# Patient Record
Sex: Female | Born: 1950 | Race: White | Hispanic: No | Smoking: Never smoker
Health system: Southern US, Community
[De-identification: ages and names within clinical notes are randomized; demographics above are authoritative.]

## PROBLEM LIST (undated history)

## (undated) DIAGNOSIS — G43909 Migraine, unspecified, not intractable, without status migrainosus: Secondary | ICD-10-CM

## (undated) DIAGNOSIS — D352 Benign neoplasm of pituitary gland: Secondary | ICD-10-CM

## (undated) DIAGNOSIS — I10 Essential (primary) hypertension: Secondary | ICD-10-CM

## (undated) DIAGNOSIS — T8859XA Other complications of anesthesia, initial encounter: Secondary | ICD-10-CM

## (undated) DIAGNOSIS — R251 Tremor, unspecified: Principal | ICD-10-CM

## (undated) DIAGNOSIS — M1990 Unspecified osteoarthritis, unspecified site: Secondary | ICD-10-CM

## (undated) DIAGNOSIS — F32A Depression, unspecified: Secondary | ICD-10-CM

## (undated) DIAGNOSIS — J189 Pneumonia, unspecified organism: Secondary | ICD-10-CM

## (undated) DIAGNOSIS — F329 Major depressive disorder, single episode, unspecified: Secondary | ICD-10-CM

## (undated) DIAGNOSIS — G43009 Migraine without aura, not intractable, without status migrainosus: Secondary | ICD-10-CM

## (undated) DIAGNOSIS — F419 Anxiety disorder, unspecified: Secondary | ICD-10-CM

## (undated) DIAGNOSIS — R7303 Prediabetes: Secondary | ICD-10-CM

## (undated) DIAGNOSIS — E785 Hyperlipidemia, unspecified: Secondary | ICD-10-CM

## (undated) HISTORY — DX: Hyperlipidemia, unspecified: E78.5

## (undated) HISTORY — DX: Benign neoplasm of pituitary gland: D35.2

## (undated) HISTORY — DX: Unspecified osteoarthritis, unspecified site: M19.90

## (undated) HISTORY — PX: NASAL SINUS SURGERY: SHX719

## (undated) HISTORY — PX: COLONOSCOPY W/ POLYPECTOMY: SHX1380

## (undated) HISTORY — PX: TONSILLECTOMY: SUR1361

## (undated) HISTORY — DX: Major depressive disorder, single episode, unspecified: F32.9

## (undated) HISTORY — PX: CHOLECYSTECTOMY: SHX55

## (undated) HISTORY — DX: Tremor, unspecified: R25.1

## (undated) HISTORY — DX: Essential (primary) hypertension: I10

## (undated) HISTORY — DX: Migraine, unspecified, not intractable, without status migrainosus: G43.909

## (undated) HISTORY — DX: Depression, unspecified: F32.A

## (undated) HISTORY — DX: Migraine without aura, not intractable, without status migrainosus: G43.009

## (undated) HISTORY — PX: WISDOM TOOTH EXTRACTION: SHX21

---

## 1998-04-17 ENCOUNTER — Ambulatory Visit (HOSPITAL_COMMUNITY): Admission: RE | Admit: 1998-04-17 | Discharge: 1998-04-17 | Payer: Self-pay | Admitting: Family Medicine

## 1998-06-20 ENCOUNTER — Other Ambulatory Visit: Admission: RE | Admit: 1998-06-20 | Discharge: 1998-06-20 | Payer: Self-pay | Admitting: Otolaryngology

## 1998-07-10 ENCOUNTER — Other Ambulatory Visit: Admission: RE | Admit: 1998-07-10 | Discharge: 1998-07-10 | Payer: Self-pay | Admitting: Gastroenterology

## 1998-07-13 ENCOUNTER — Ambulatory Visit (HOSPITAL_COMMUNITY): Admission: RE | Admit: 1998-07-13 | Discharge: 1998-07-13 | Payer: Self-pay | Admitting: Gastroenterology

## 1998-07-26 ENCOUNTER — Ambulatory Visit (HOSPITAL_COMMUNITY): Admission: RE | Admit: 1998-07-26 | Discharge: 1998-07-26 | Payer: Self-pay | Admitting: Gastroenterology

## 1999-04-11 ENCOUNTER — Encounter: Payer: Self-pay | Admitting: Family Medicine

## 1999-04-11 ENCOUNTER — Ambulatory Visit: Admission: RE | Admit: 1999-04-11 | Discharge: 1999-04-11 | Payer: Self-pay | Admitting: Family Medicine

## 1999-06-14 ENCOUNTER — Encounter: Payer: Self-pay | Admitting: Family Medicine

## 1999-06-14 ENCOUNTER — Ambulatory Visit (HOSPITAL_COMMUNITY): Admission: RE | Admit: 1999-06-14 | Discharge: 1999-06-14 | Payer: Self-pay | Admitting: Family Medicine

## 1999-11-27 ENCOUNTER — Ambulatory Visit (HOSPITAL_COMMUNITY): Admission: RE | Admit: 1999-11-27 | Discharge: 1999-11-27 | Payer: Self-pay | Admitting: Family Medicine

## 1999-11-27 ENCOUNTER — Encounter: Payer: Self-pay | Admitting: Family Medicine

## 2000-06-25 ENCOUNTER — Ambulatory Visit (HOSPITAL_COMMUNITY): Admission: RE | Admit: 2000-06-25 | Discharge: 2000-06-25 | Payer: Self-pay | Admitting: Family Medicine

## 2000-06-25 ENCOUNTER — Encounter: Payer: Self-pay | Admitting: Family Medicine

## 2000-09-10 ENCOUNTER — Encounter: Payer: Self-pay | Admitting: *Deleted

## 2000-09-10 ENCOUNTER — Emergency Department (HOSPITAL_COMMUNITY): Admission: EM | Admit: 2000-09-10 | Discharge: 2000-09-10 | Payer: Self-pay

## 2001-07-08 ENCOUNTER — Ambulatory Visit (HOSPITAL_COMMUNITY): Admission: RE | Admit: 2001-07-08 | Discharge: 2001-07-08 | Payer: Self-pay | Admitting: Family Medicine

## 2002-06-02 ENCOUNTER — Ambulatory Visit (HOSPITAL_COMMUNITY): Admission: RE | Admit: 2002-06-02 | Discharge: 2002-06-02 | Payer: Self-pay | Admitting: Gastroenterology

## 2002-09-10 ENCOUNTER — Ambulatory Visit (HOSPITAL_COMMUNITY): Admission: RE | Admit: 2002-09-10 | Discharge: 2002-09-10 | Payer: Self-pay | Admitting: *Deleted

## 2002-09-10 ENCOUNTER — Encounter (INDEPENDENT_AMBULATORY_CARE_PROVIDER_SITE_OTHER): Payer: Self-pay | Admitting: Specialist

## 2003-02-07 ENCOUNTER — Ambulatory Visit (HOSPITAL_COMMUNITY): Admission: RE | Admit: 2003-02-07 | Discharge: 2003-02-07 | Payer: Self-pay | Admitting: Family Medicine

## 2003-02-07 ENCOUNTER — Encounter: Payer: Self-pay | Admitting: Family Medicine

## 2003-04-21 ENCOUNTER — Ambulatory Visit (HOSPITAL_COMMUNITY): Admission: RE | Admit: 2003-04-21 | Discharge: 2003-04-21 | Payer: Self-pay | Admitting: Gastroenterology

## 2003-04-21 ENCOUNTER — Encounter: Payer: Self-pay | Admitting: Gastroenterology

## 2003-04-21 IMAGING — CT CT ABDOMEN W/ CM
1 of 4 series · 14 of 32 positions shown, 19 images · IV contrast (omnipaque)
Comparison: none

FINDINGS
CLINICAL DATA: ABDOMINAL PAIN
CT ABDOMEN WITH CONTRAST:
MULTIPLE SPIRAL IMAGES WERE MADE THROUGH THE ABDOMEN AFTER THE INTRAVENOUS INJECTION OF 150 CC
OMNIPAQUE 300.  ORAL CONTRAST WAS ALSO USED.  THE LUNG BASES ARE NORMAL.  THERE IS FATTY CHANGE OF
THE LIVER.  THE SPLEEN, ADRENAL GLANDS, KIDNEYS AND PANCREAS ARE NORMAL.  THERE ARE MULTIPLE
GALLSTONES.  THERE IS NO ACUTE ABNORMALITY.
IMPRESSION
CHOLELITHIASIS WITH NO ACUTE INTRAABDOMINAL ABNORMALITY.
CT SCAN OF THE PELVIS WITH CONTRAST:
ADDITIONAL SPIRAL IMAGES WERE MADE THROUGH THE PELVIS AFTER ORAL AND INTRAVENOUS CONTRAST.  THERE
IS A PROBABLE A 2CM CYST OF THE RIGHT OVARY.  NO FREE FLUID OR INFLAMMATORY CHANGE WITHIN THE
PELVIS.  THERE IS NO ADENOPATHY.  THERE IS NO BONY ABNORMALITY OF THE ABDOMEN OR PELVIS.
PROBABLE 2CM RIGHT OVARIAN CYST.

[Series 2: abd/pelvis 5.0 b30f · axial · 0.62mm/px · z∈[-449,-74]mm · 14 of 83 slices shown, 19 images]
[im 5/83  soft-tissue]
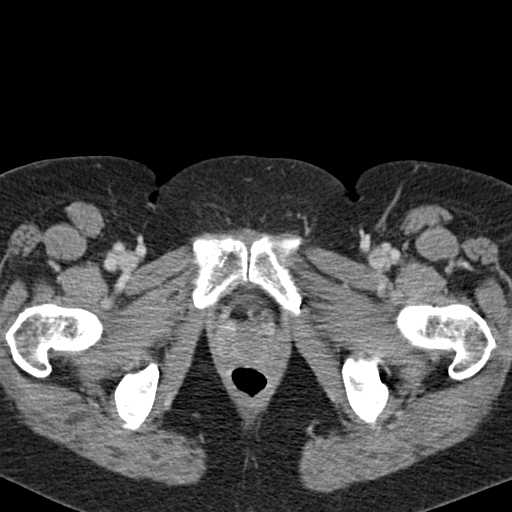
[im 5/83  bone]
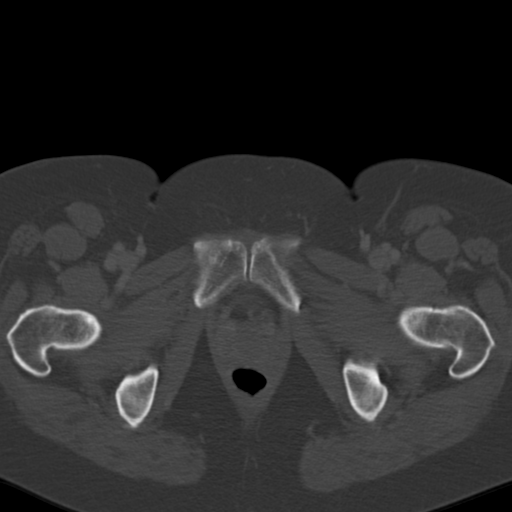
[im 10/83  soft-tissue]
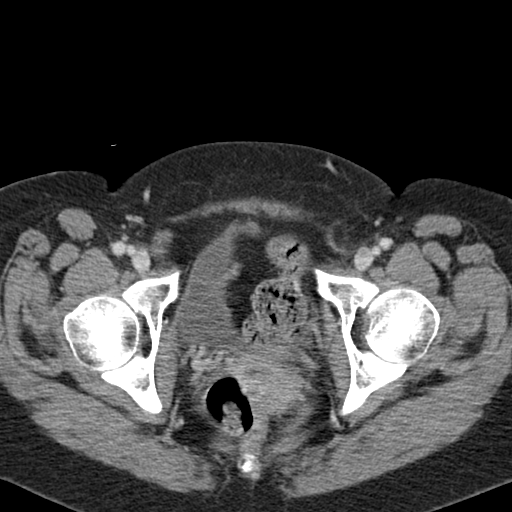
[im 19/83  soft-tissue]
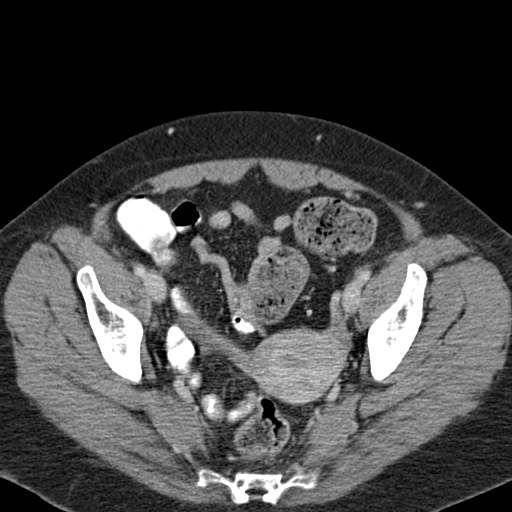
[im 23/83  soft-tissue]
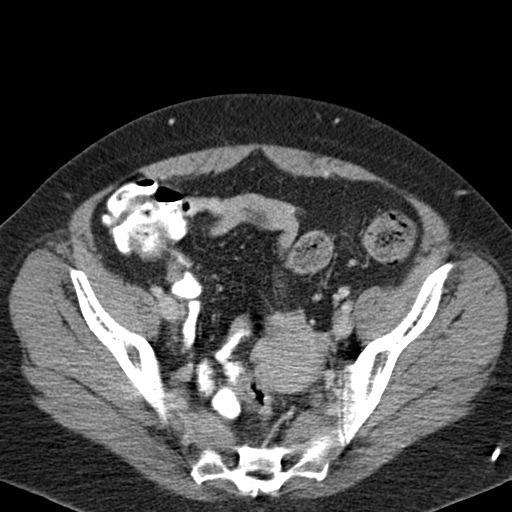
[im 28/83  soft-tissue]
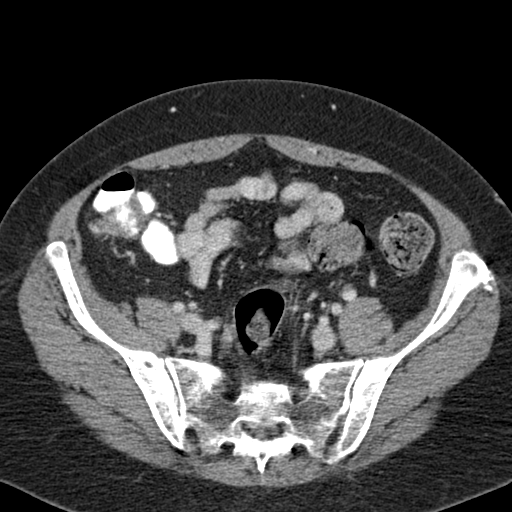
[im 37/83  soft-tissue]
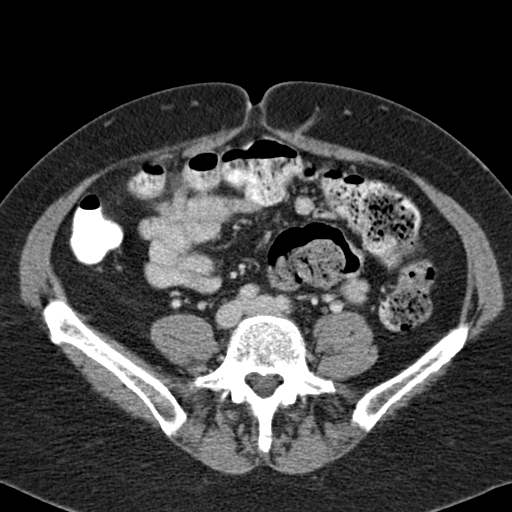
[im 42/83  soft-tissue]
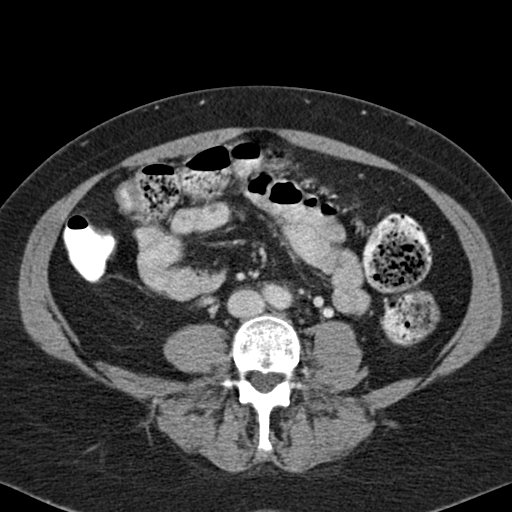
[im 46/83  soft-tissue]
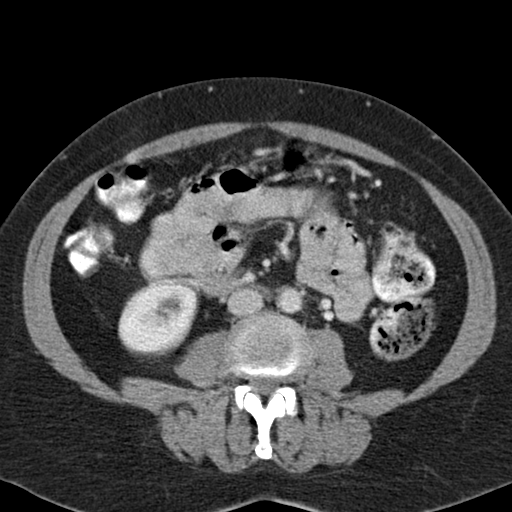
[im 55/83  soft-tissue]
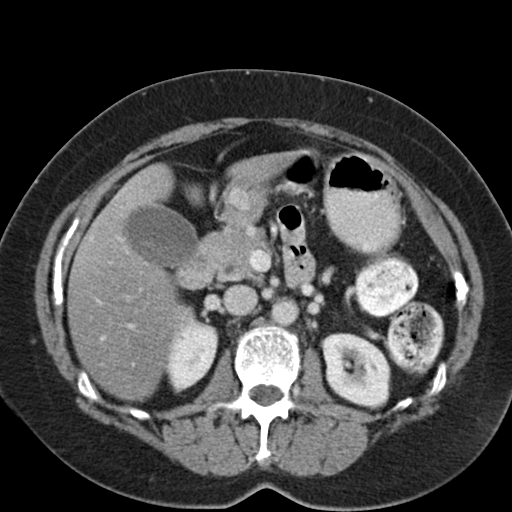
[im 55/83  bone]
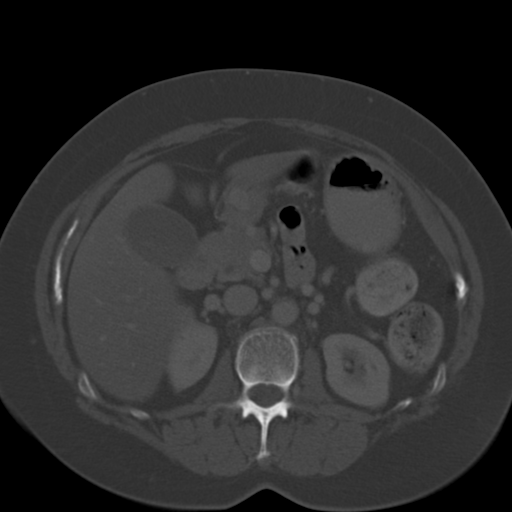
[im 60/83  soft-tissue]
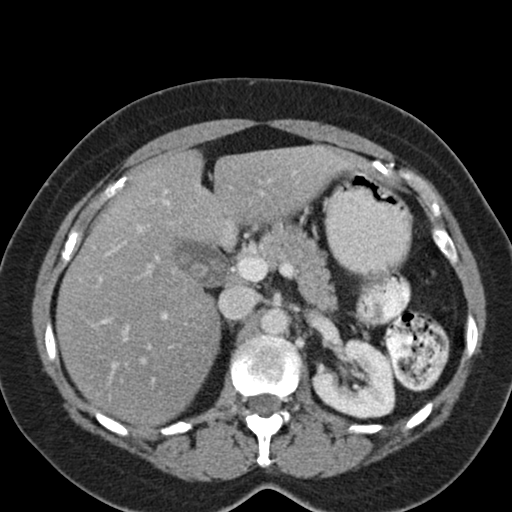
[im 64/83  soft-tissue]
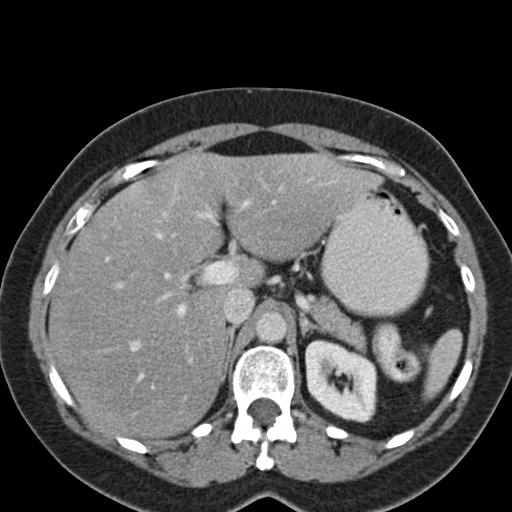
[im 64/83  lung]
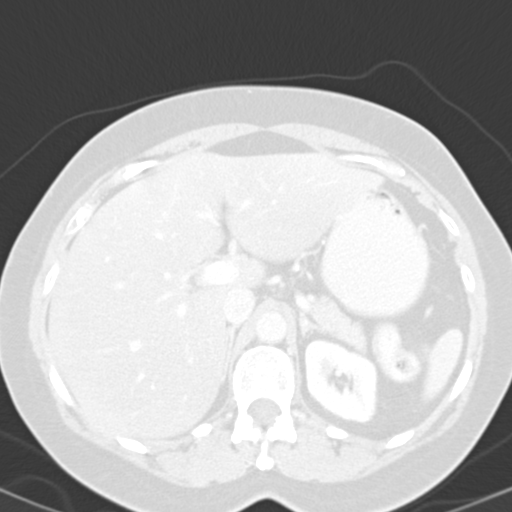
[im 69/83  lung]
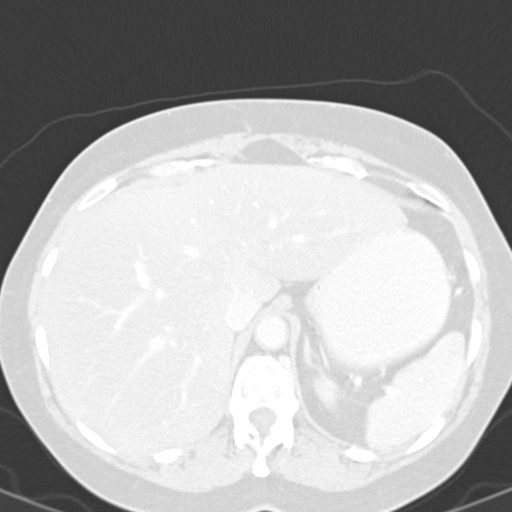
[im 73/83  soft-tissue]
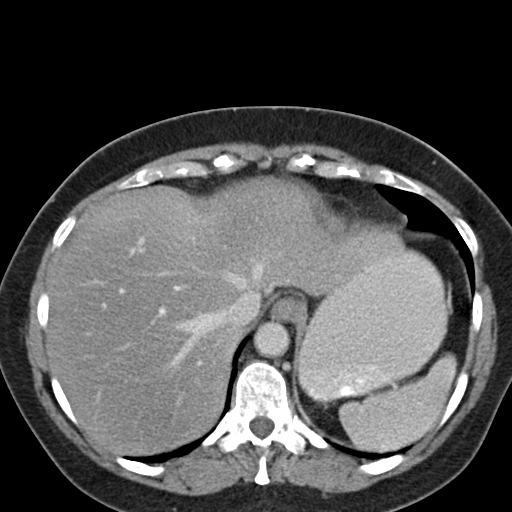
[im 73/83  lung]
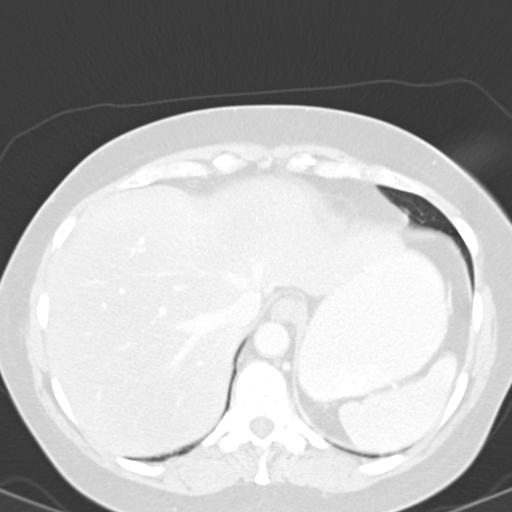
[im 78/83  soft-tissue]
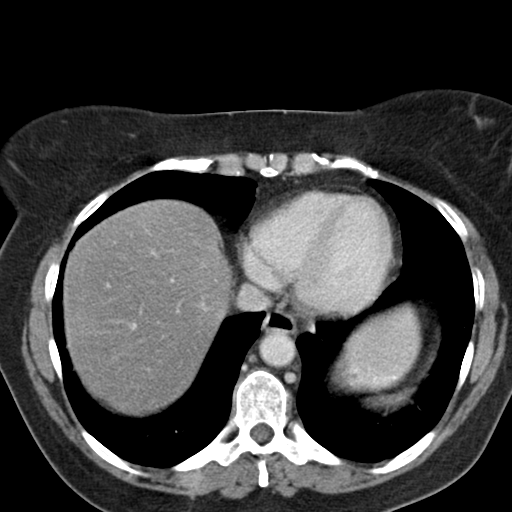
[im 78/83  lung]
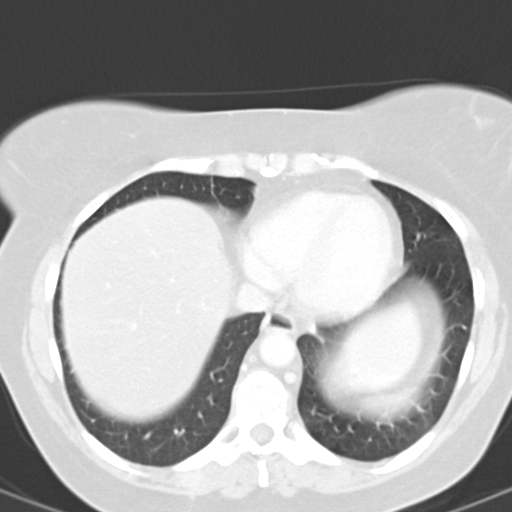

[14 of 32 positions shown; findings below may reference images not displayed]

## 2003-04-23 ENCOUNTER — Observation Stay (HOSPITAL_COMMUNITY): Admission: RE | Admit: 2003-04-23 | Discharge: 2003-04-24 | Payer: Self-pay | Admitting: *Deleted

## 2003-04-23 ENCOUNTER — Encounter (INDEPENDENT_AMBULATORY_CARE_PROVIDER_SITE_OTHER): Payer: Self-pay | Admitting: Specialist

## 2003-05-23 ENCOUNTER — Other Ambulatory Visit: Admission: RE | Admit: 2003-05-23 | Discharge: 2003-05-23 | Payer: Self-pay | Admitting: *Deleted

## 2003-12-13 ENCOUNTER — Ambulatory Visit (HOSPITAL_BASED_OUTPATIENT_CLINIC_OR_DEPARTMENT_OTHER): Admission: RE | Admit: 2003-12-13 | Discharge: 2003-12-13 | Payer: Self-pay | Admitting: Otolaryngology

## 2003-12-13 ENCOUNTER — Ambulatory Visit (HOSPITAL_COMMUNITY): Admission: RE | Admit: 2003-12-13 | Discharge: 2003-12-13 | Payer: Self-pay | Admitting: Otolaryngology

## 2003-12-13 ENCOUNTER — Encounter (INDEPENDENT_AMBULATORY_CARE_PROVIDER_SITE_OTHER): Payer: Self-pay | Admitting: *Deleted

## 2004-04-09 ENCOUNTER — Ambulatory Visit (HOSPITAL_COMMUNITY): Admission: RE | Admit: 2004-04-09 | Discharge: 2004-04-09 | Payer: Self-pay | Admitting: Family Medicine

## 2004-04-18 ENCOUNTER — Encounter: Admission: RE | Admit: 2004-04-18 | Discharge: 2004-04-18 | Payer: Self-pay | Admitting: Family Medicine

## 2004-04-19 ENCOUNTER — Encounter: Admission: RE | Admit: 2004-04-19 | Discharge: 2004-04-19 | Payer: Self-pay | Admitting: Family Medicine

## 2004-05-04 ENCOUNTER — Encounter (HOSPITAL_COMMUNITY): Admission: RE | Admit: 2004-05-04 | Discharge: 2004-05-10 | Payer: Self-pay | Admitting: Family Medicine

## 2004-05-04 IMAGING — MR MR BREAST BILAT WO/W CM
5 of 6 series · 20 of 48 positions shown · IV contrast (omniscan)
Comparison: none

Bilateral CC and MLO view(s) were taken.
Technologist: Queena Jumper

FINDINGS
BILATERAL BREAST MRI WITH/WITHOUT CONTRAST
Clinical:  Mild tissue asymmetry in the upper outer left breast on mammogram.
TECHNIQUE: Multisequence sagittal images were obtained through both breasts before and after
administration of 18 cc of Omniscan contrast.  Correlation is made with the mammogram from [facility]
[REDACTED] dated 04/09/04 and 04/19/04.

[Series 1: 3-plane local · axial · 5.0mm · 1.56mm/px · 1 of 33 slices shown]
[im 1/33]
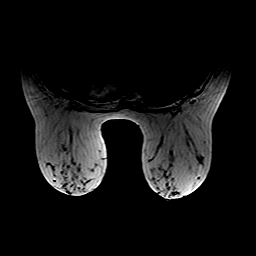

[Series 3: T2 · sagittal · 4.0mm · 0.39mm/px · 2 of 56 slices shown]
[im 1/56]
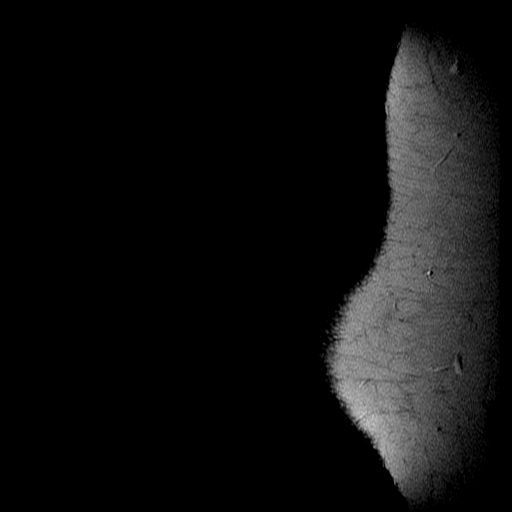
[im 56/56]
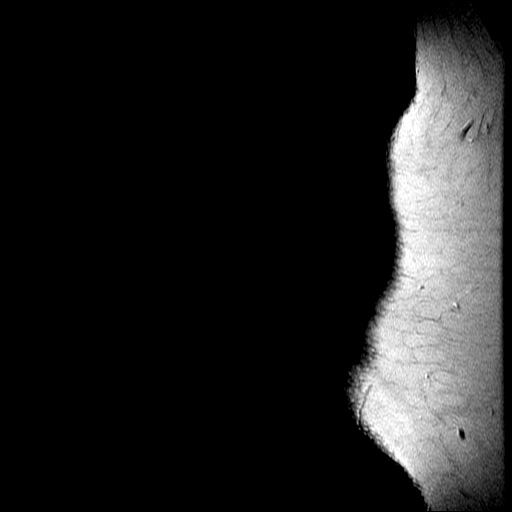

[Series 4: vibrant - large · sagittal · 2.8mm · 0.39mm/px · 4 of 108 slices shown]
[im 1/108]
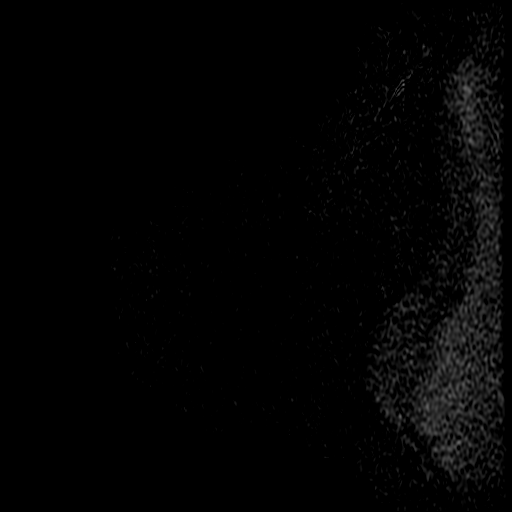
[im 36/108]
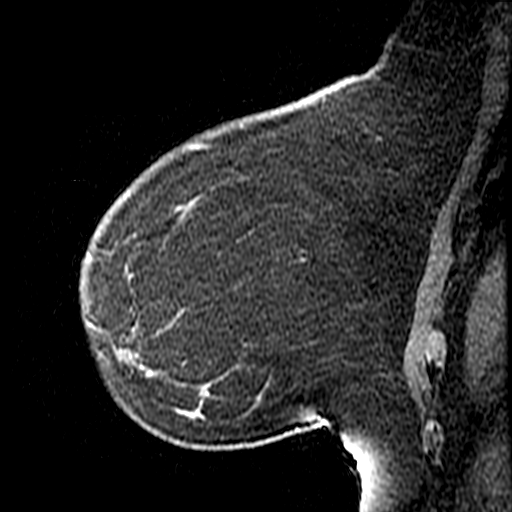
[im 72/108]
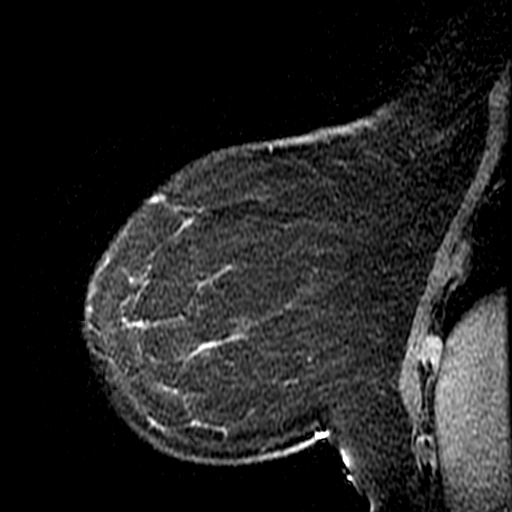
[im 108/108]
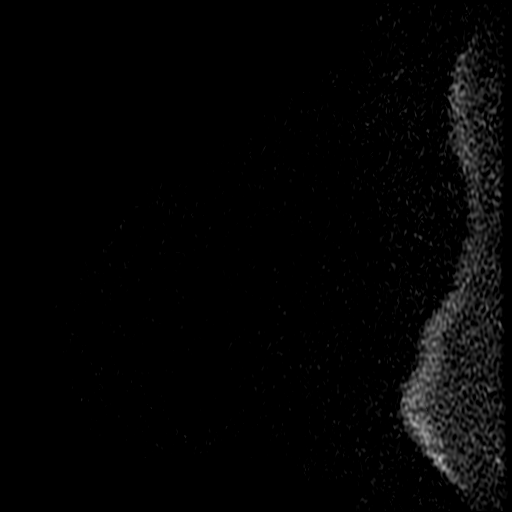

[Series 5: vibrant -18 ml · sagittal · 2.8mm · 0.39mm/px · 11 of 648 slices shown]
[im 30/648]
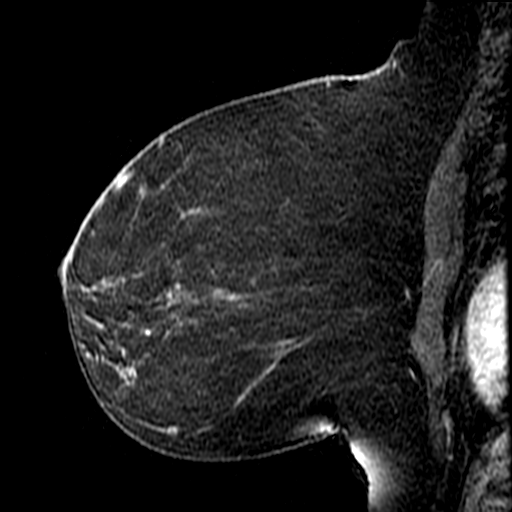
[im 89/648]
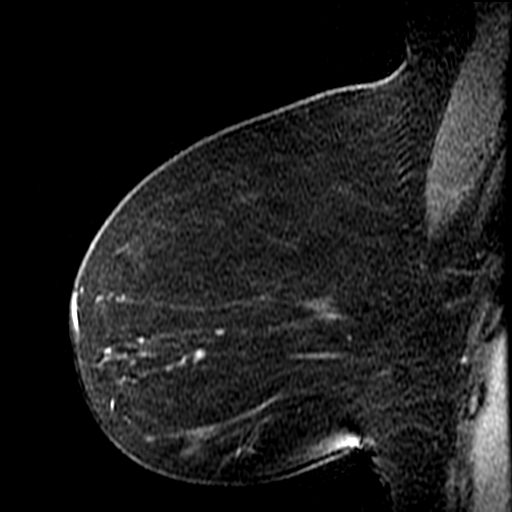
[im 118/648]
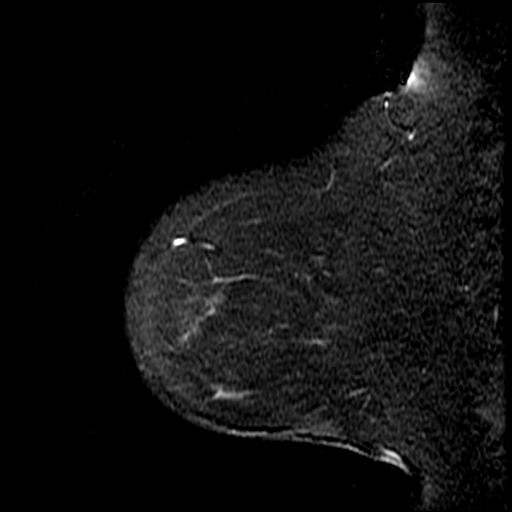
[im 206/648]
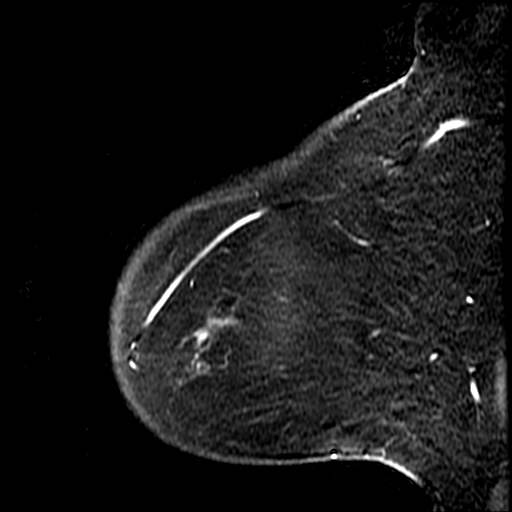
[im 295/648]
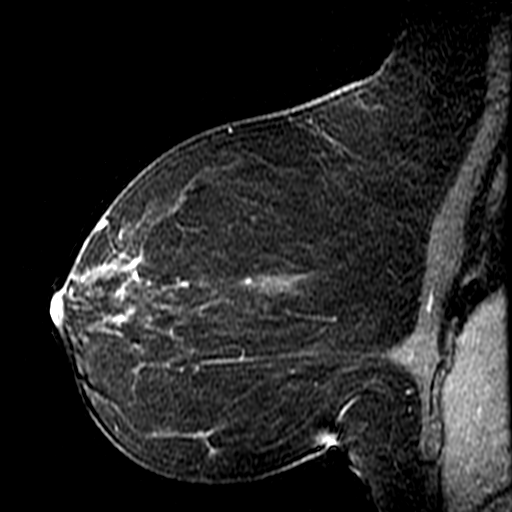
[im 324/648]
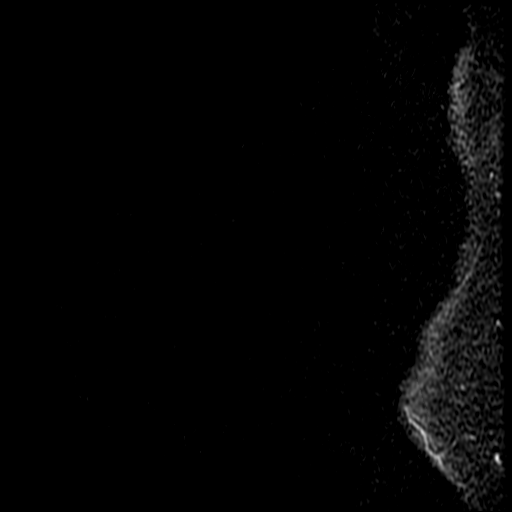
[im 353/648]
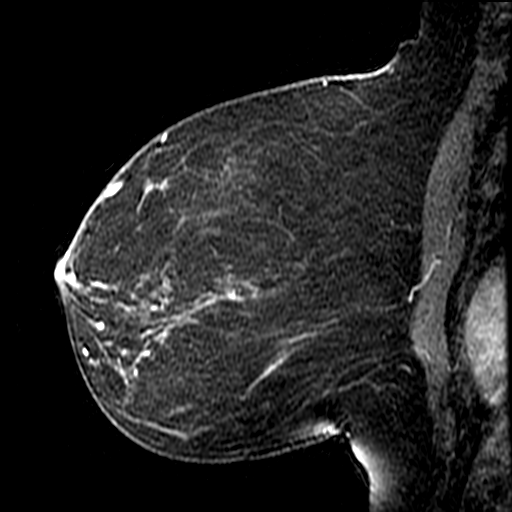
[im 442/648]
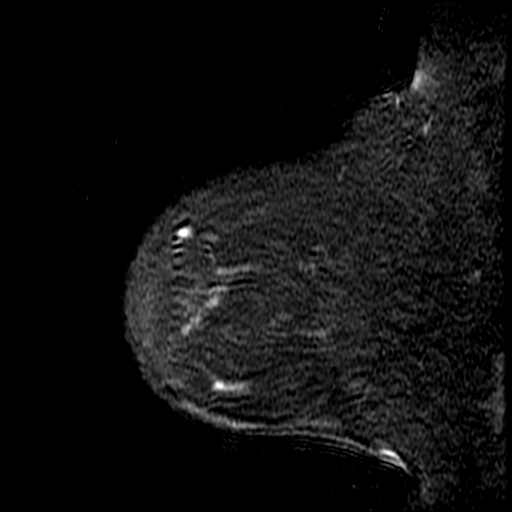
[im 530/648]
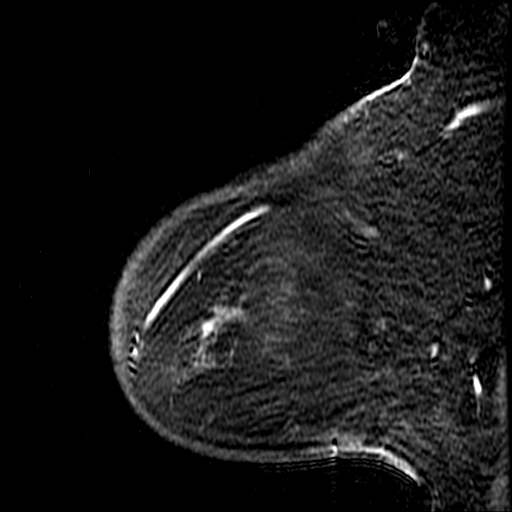
[im 559/648]
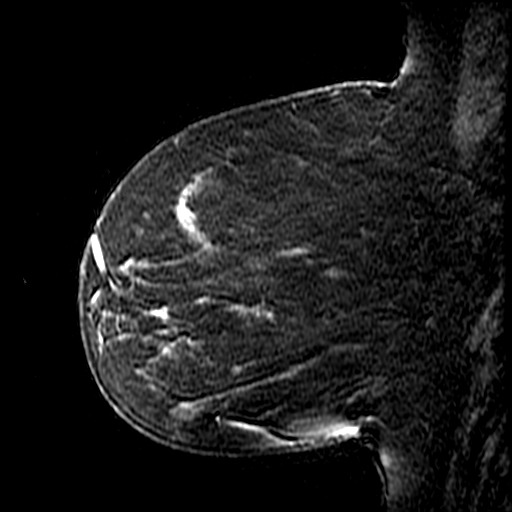
[im 618/648]
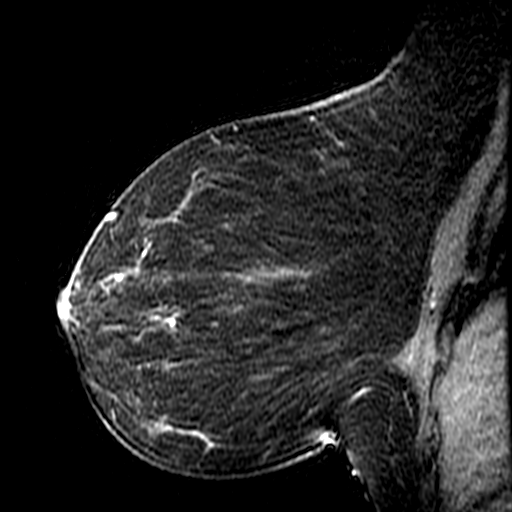

[Series 500: ((date))-((date)) · sagittal · 2.8mm · 0.39mm/px · 2 of 494 slices shown]
[im 31/494]
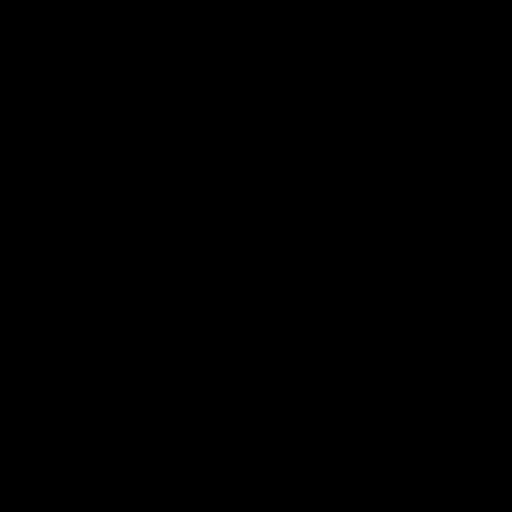
[im 93/494]
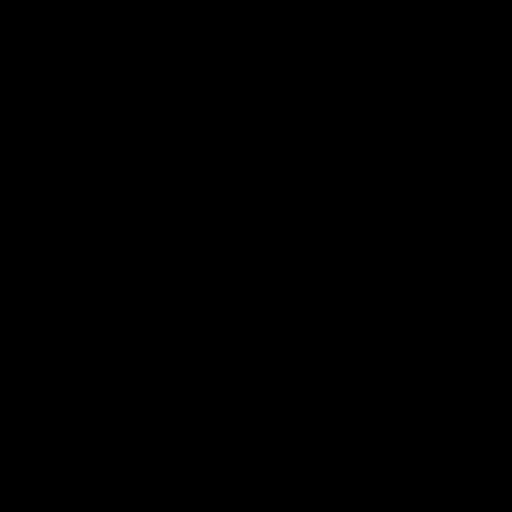

[20 of 48 positions shown; findings below may reference images not displayed]

FINDINGS: No suspicious masses or enhancement are identified in both breasts.  No lower axillary
adenopathy is noted bilaterally. Small islands of normal enhancing tissue are seen within the
lateral aspects of both breasts which are generally symmetric.

IMPRESSION

There is no MRI evidence of malignancy bilaterally.  A left-sided mammogram in six months is
recommended.

ASSESSMENT: Benign - BI-RADS 2

Mammogram of the left breast.

## 2004-05-30 ENCOUNTER — Encounter: Admission: RE | Admit: 2004-05-30 | Discharge: 2004-05-30 | Payer: Self-pay | Admitting: Gastroenterology

## 2004-05-30 IMAGING — CT CT ABDOMEN W/ CM
1 of 3 series · 14 of 32 positions shown, 19 images · IV contrast (GASTRO. & [ID] OMNI 300)
Comparison: none

CLINICAL DATA: Left upper quadrant abdominal pain for 2? weeks.  History of cholecystectomy and appendectomy.  
   CT OF THE ABDOMEN AND PELVIS WITH CONTRAST
 Multidetector helical CT imaging was performed during the IV bolus injection of 100 cc Omnipaque 300.  Oral contrast was given.  Correlation is made with a prior study done at [HOSPITAL] on 04/21/03.
 ABDOMEN:
 Tiny granuloma at the left lung base on image 10 is noted.  The lung bases are otherwise clear.  The liver demonstrates diffuse mild fatty infiltration without focal abnormality.  The patient has undergone interval cholecystectomy.  There is no biliary dilatation.  6 mm cyst in the upper pole of the left kidney is unchanged.  The spleen, pancreas, adrenal glands and kidneys are otherwise unremarkable.  No bowel lesion or inflammatory process is demonstrated.
 IMPRESSION
 Stable abdominal CT status post interval cholecystectomy.  No explanation for left upper quadrant pain.
 PELVIS: 
 No pelvic mass, fluid collection, or inflammatory process is demonstrated.  The uterus and adnexa appear unremarkable.  Stool is present throughout the colon. 
 No acute pelvic findings.

[Series 2: — · axial · 0.70mm/px · z∈[-340,+50]mm · 14 of 88 slices shown, 19 images]
[im 5/88  soft-tissue]
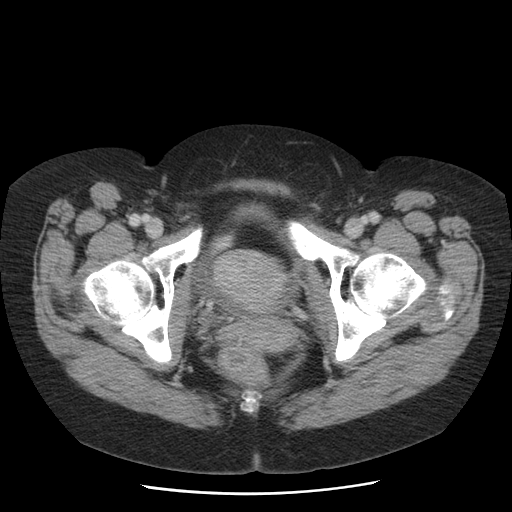
[im 5/88  bone]
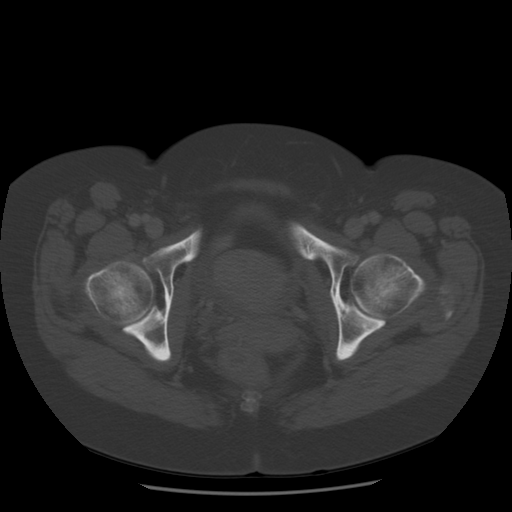
[im 13/88  soft-tissue]
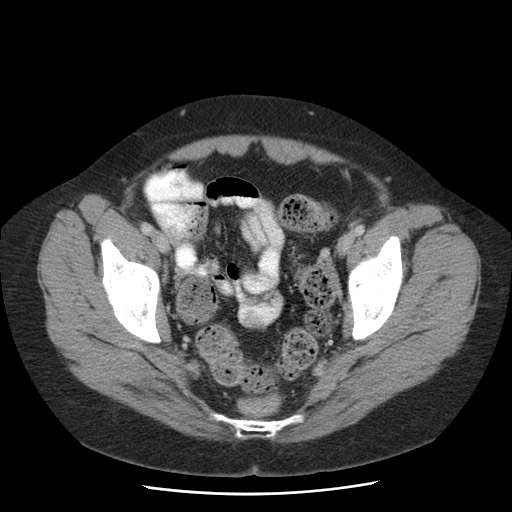
[im 17/88  soft-tissue]
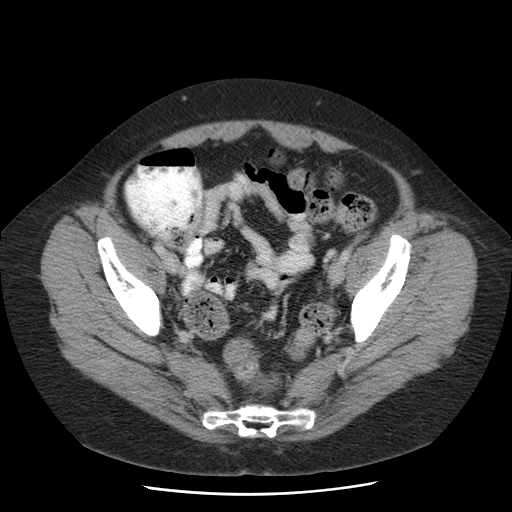
[im 25/88  soft-tissue]
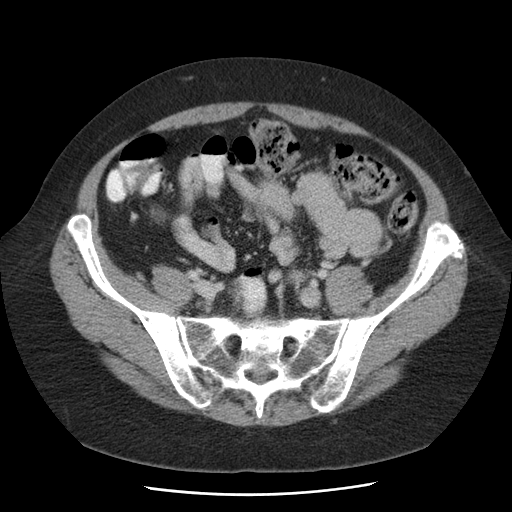
[im 30/88  soft-tissue]
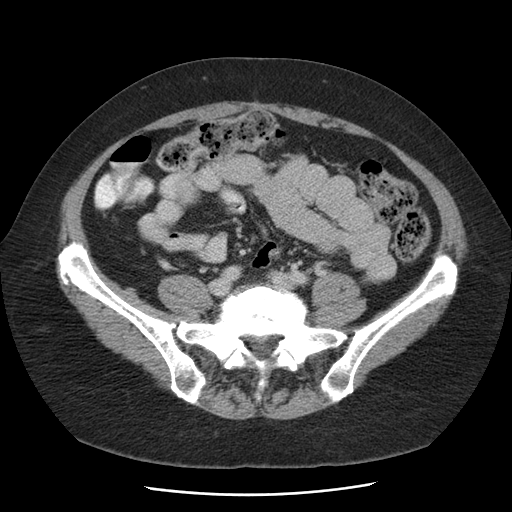
[im 38/88  soft-tissue]
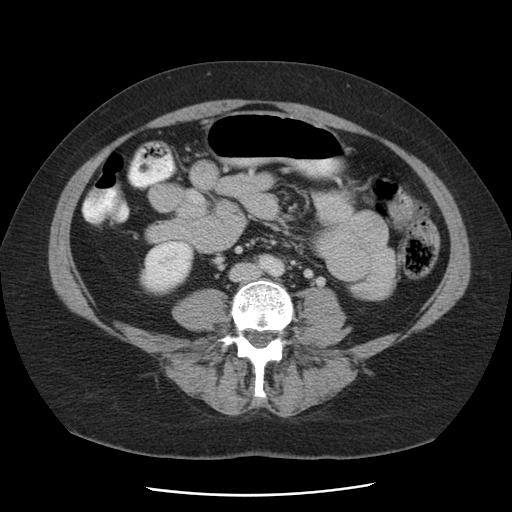
[im 46/88  soft-tissue]
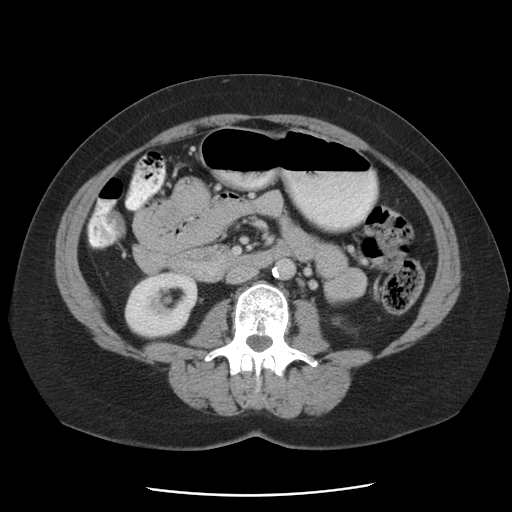
[im 50/88  soft-tissue]
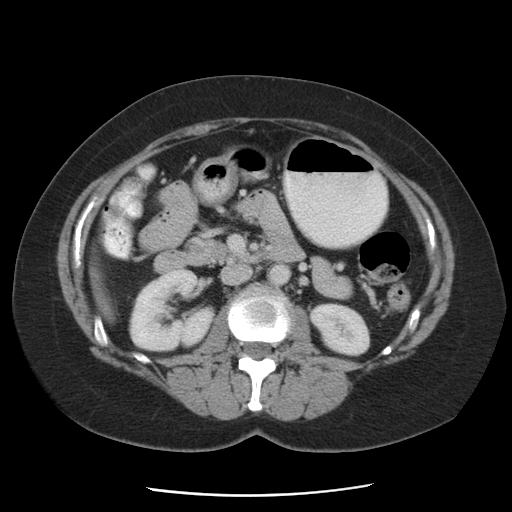
[im 59/88  soft-tissue]
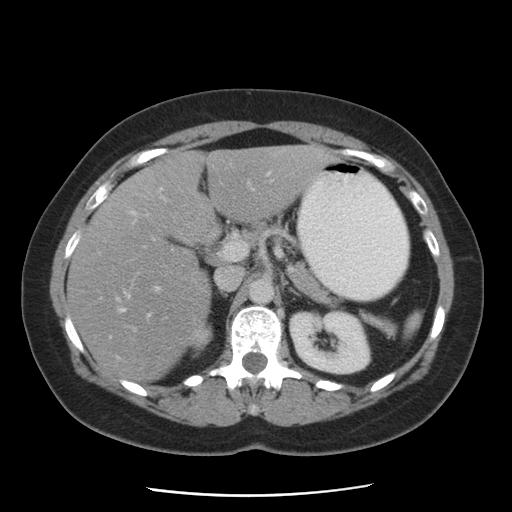
[im 59/88  bone]
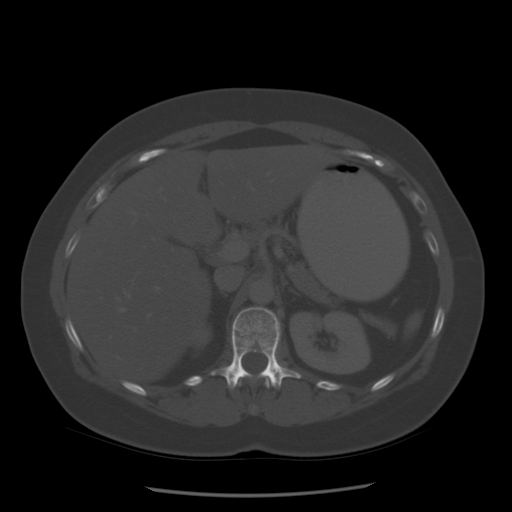
[im 63/88  soft-tissue]
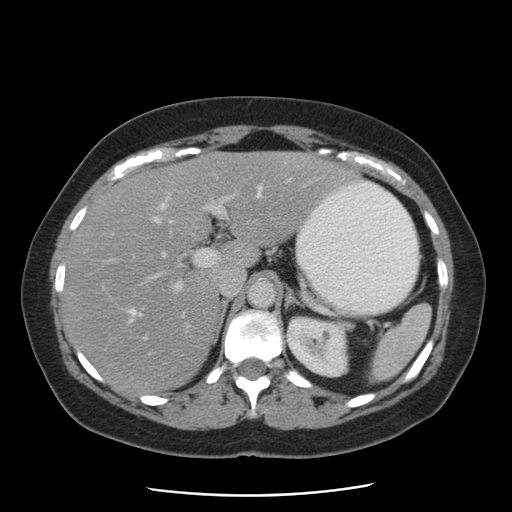
[im 71/88  soft-tissue]
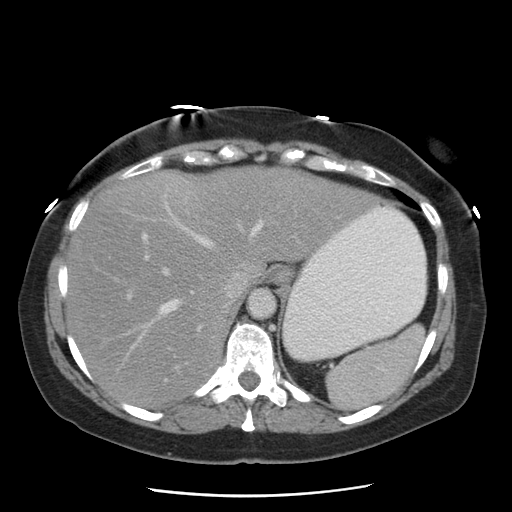
[im 71/88  lung]
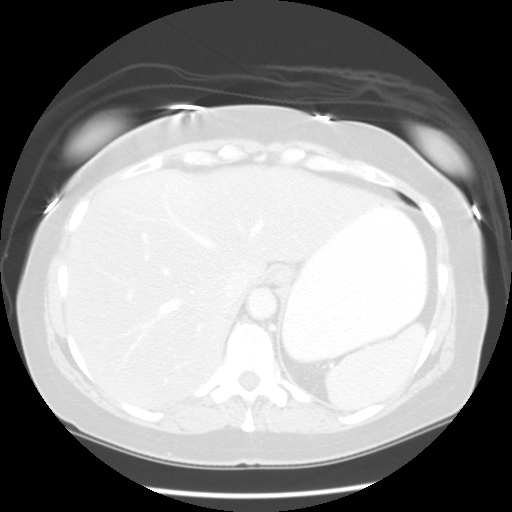
[im 75/88  soft-tissue]
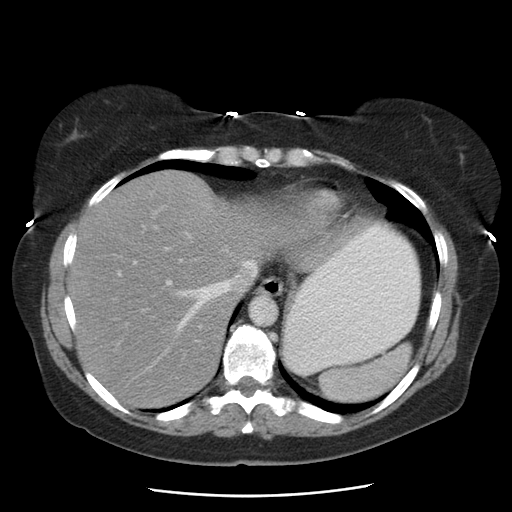
[im 75/88  lung]
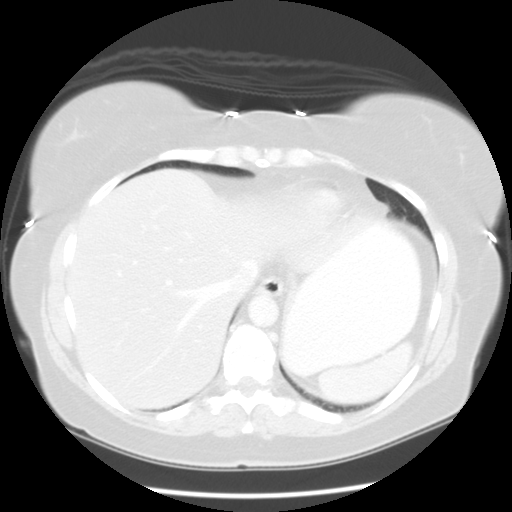
[im 79/88  lung]
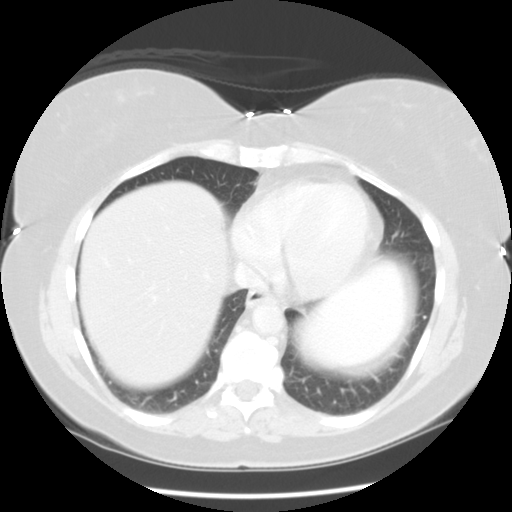
[im 83/88  soft-tissue]
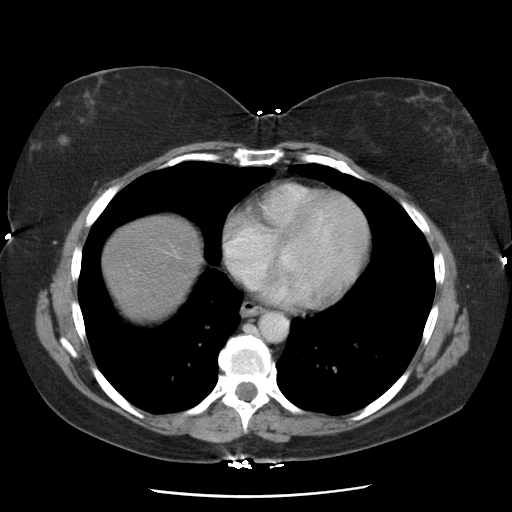
[im 83/88  lung]
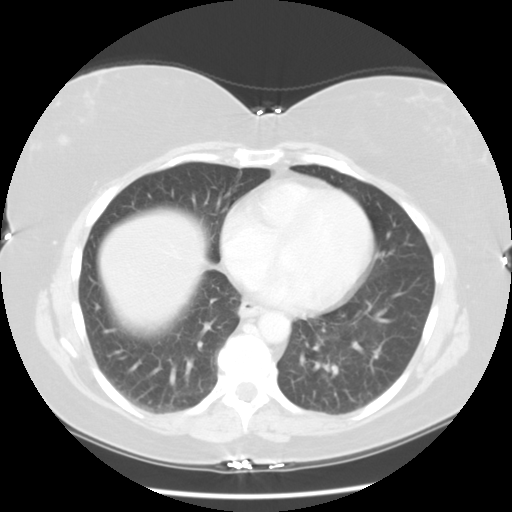

[14 of 32 positions shown; findings below may reference images not displayed]

## 2005-01-17 ENCOUNTER — Emergency Department (HOSPITAL_COMMUNITY): Admission: EM | Admit: 2005-01-17 | Discharge: 2005-01-17 | Payer: Self-pay | Admitting: Emergency Medicine

## 2005-06-10 ENCOUNTER — Encounter: Admission: RE | Admit: 2005-06-10 | Discharge: 2005-06-10 | Payer: Self-pay | Admitting: *Deleted

## 2006-03-18 ENCOUNTER — Emergency Department (HOSPITAL_COMMUNITY): Admission: EM | Admit: 2006-03-18 | Discharge: 2006-03-19 | Payer: Self-pay | Admitting: Emergency Medicine

## 2007-01-20 ENCOUNTER — Ambulatory Visit (HOSPITAL_COMMUNITY): Admission: RE | Admit: 2007-01-20 | Discharge: 2007-01-20 | Payer: Self-pay | Admitting: Obstetrics and Gynecology

## 2008-01-21 ENCOUNTER — Ambulatory Visit (HOSPITAL_COMMUNITY): Admission: RE | Admit: 2008-01-21 | Discharge: 2008-01-21 | Payer: Self-pay | Admitting: Obstetrics and Gynecology

## 2008-04-05 ENCOUNTER — Encounter: Admission: RE | Admit: 2008-04-05 | Discharge: 2008-04-05 | Payer: Self-pay | Admitting: Specialist

## 2008-04-06 ENCOUNTER — Ambulatory Visit: Payer: Self-pay | Admitting: Cardiology

## 2008-04-06 ENCOUNTER — Observation Stay (HOSPITAL_COMMUNITY): Admission: EM | Admit: 2008-04-06 | Discharge: 2008-04-08 | Payer: Self-pay | Admitting: Emergency Medicine

## 2008-04-06 IMAGING — CR DG CHEST 1V PORT
1 series · 1 of 1 positions shown · non-contrast
Comparison: None

CLINICAL DATA: Chest pain, shortness of breath

PORTABLE CHEST - 1 VIEW

[AP]
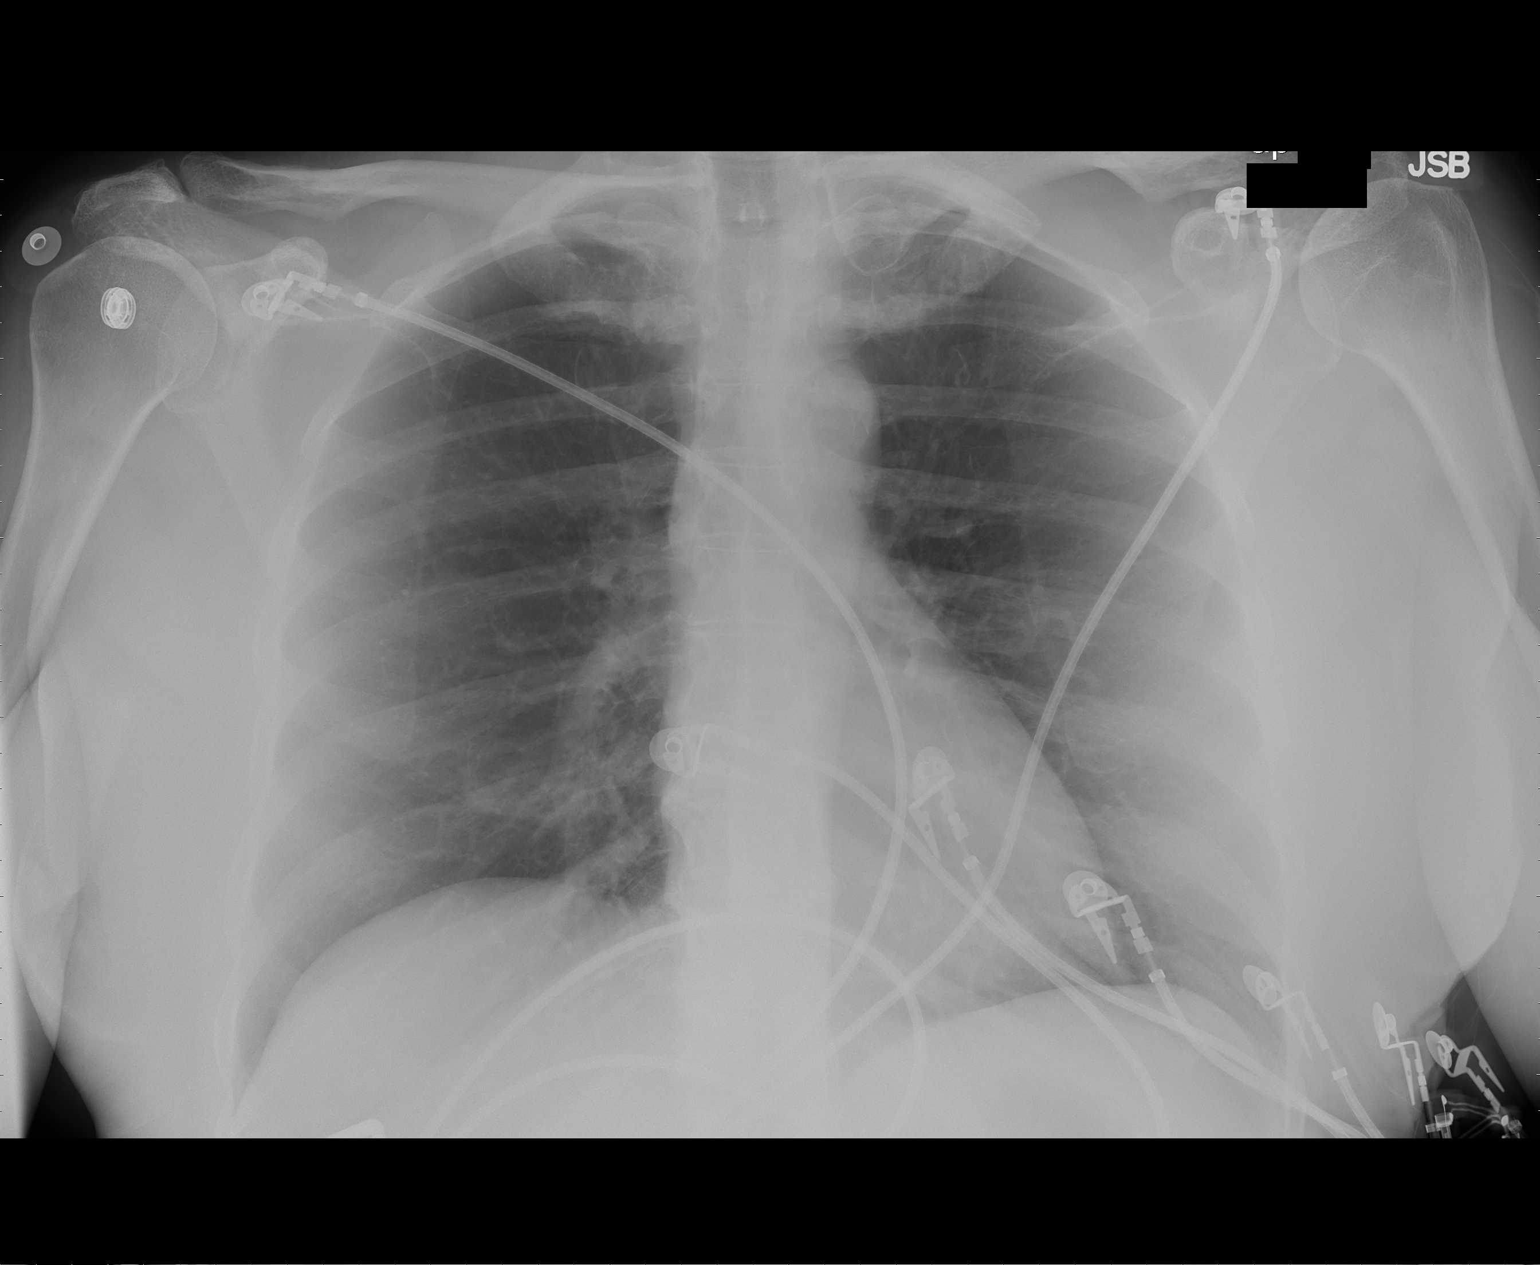

[1 of 1 positions shown; findings below may reference images not displayed]

FINDINGS: Heart and mediastinal contours are within normal limits.
No focal opacities or effusions.  No acute bony abnormality.
IMPRESSION: No active disease.

## 2008-04-07 ENCOUNTER — Encounter (INDEPENDENT_AMBULATORY_CARE_PROVIDER_SITE_OTHER): Payer: Self-pay | Admitting: Internal Medicine

## 2008-04-19 ENCOUNTER — Ambulatory Visit: Payer: Self-pay

## 2008-05-05 ENCOUNTER — Ambulatory Visit: Payer: Self-pay | Admitting: Cardiology

## 2009-01-23 ENCOUNTER — Ambulatory Visit (HOSPITAL_COMMUNITY): Admission: RE | Admit: 2009-01-23 | Discharge: 2009-01-23 | Payer: Self-pay | Admitting: Obstetrics and Gynecology

## 2010-02-09 ENCOUNTER — Ambulatory Visit (HOSPITAL_COMMUNITY): Admission: RE | Admit: 2010-02-09 | Discharge: 2010-02-09 | Payer: Self-pay | Admitting: Obstetrics and Gynecology

## 2010-04-19 ENCOUNTER — Encounter: Admission: RE | Admit: 2010-04-19 | Discharge: 2010-05-18 | Payer: Self-pay | Admitting: Specialist

## 2010-10-21 ENCOUNTER — Encounter: Payer: Self-pay | Admitting: Family Medicine

## 2011-02-12 NOTE — Discharge Summary (Signed)
NAMEDANEILLE, DESILVA          ACCOUNT NO.:  [id number]   MEDICAL RECORD NO.:  [id number]          PATIENT TYPE:  INP   LOCATION:  2030                         FACILITY:  [facility]   PHYSICIAN:  Altha Harm, MDDATE OF BIRTH:  1951-04-03   DATE OF ADMISSION:  04/06/2008  DATE OF DISCHARGE:  04/08/2008                               DISCHARGE SUMMARY   DISCHARGE DISPOSITION:  Home.   DISCHARGE DIAGNOSES:  1. Chest pain, atypical.  2. Hyperlipidemia.  3. History of hypertension.  4. History of headaches.  5. History of allergic rhinitis.   DISCHARGE MEDICATIONS:  The patient is to continue her medications at  the prescribed doses prehospital.  Please note that we have no records  of the patient's medications.  I have asked the patient to get her  medications and the doses, which she states that she is unable to  provide, and her primary care doctor does not have the doses of her  headache medications.  We are unable to get those medication doses at  this time.  Thus, the patient will be instructed to resume her  medications as prehospital.  They include the following.  1. Triamterene.  2. Lipitor.  3. Depakote as needed.  4. Imipramine as needed.  5. Astelin as needed.  6. Singulair 10 mg daily as needed.  7. In addition, the patient is being prescribed Lopressor 12.5 mg p.o.      b.i.d.   CONSULTANTS:  [facility].   PROCEDURES:  None.   DIAGNOSTIC STUDIES:  1. Portable chest x-ray done on admission which shows no active      disease.  2. 2-D echocardiogram which shows normal left ventricular systolic      function with ejection fraction of 50-55%.  No regional wall motion      abnormalities, ventricular wall thickness mildly increased, and      Doppler parameters consistent with abnormal left ventricular      relaxation.   CODE STATUS:  Full Code.   ALLERGIES:  No known drug allergies.   CHIEF COMPLAINT:  Chest pain.   HISTORY OF PRESENT ILLNESS:   Please see the H&P dictated by Dr. Christella Noa  for details of the HPI.   HOSPITAL COURSE:  The patient was admitted to the hospital.  She was  placed on observation and serial enzymes ordered on the patient.  All  her serial enzymes were negative.  Cardiology is consulted to see the  patient and is here to offer an opinion.  However, the patient has made  the decision despite the cardiology consult that she would rather be  discharged home and have any stress testing done as an outpatient.  Thus, the patient will be discharged home and arrangements will be made  for stress test after the patient has been seen by cardiology.   FOLLOWUP:  The patient is to follow up with her primary care physician  as needed and an addendum to her discharge summary will be made pending  the recommendations of cardiology after she has been seen.      Altha Harm, MD  Electronically Signed  MAM/MEDQ  D:  04/08/2008  T:  04/08/2008  Job:  [id number]

## 2011-02-12 NOTE — Consult Note (Signed)
NAMEMANDEEP, FERCH          ACCOUNT NO.:  [id number]   MEDICAL RECORD NO.:  [id number]          PATIENT TYPE:  INP   LOCATION:  2030                         FACILITY:  [facility]   PHYSICIAN:  Madolyn Frieze. Jens Som, MD, FACCDATE OF BIRTH:  Oct 19, 1950   DATE OF CONSULTATION:  04/08/2008  DATE OF DISCHARGE:  04/08/2008                                 CONSULTATION   PRIMARY CARE PHYSICIAN:  Ace Gins, MD, at [facility].   CHIEF COMPLAINT:  Chest pain.   HISTORY OF PRESENT ILLNESS:  Ms. Yontz is a 60 year old female with  no previous history of coronary artery disease.  She was in her usual  state of health on April 06, 2008.  She was on her way to [facility] that  night with her husband and when she got out of the car, she had sudden  onset of substernal chest pain that she states reached a 9/10.  She  describes it as a tightness in her heart pain but not throbbing.  EMS  was called when her symptoms did not resolve, and she was transported to  the emergency room.  She was given aspirin, but no other medicines,  because they were unable to establish an IV.  In the emergency room,  they gained IV access and she received IV Lopressor but no nitroglycerin  is remembered by the patient or recorded.  Her symptoms lasted a total  of approximately 1 hour and resolved without any further therapy.  She  has never had these before.  There has been no recurrence of symptoms  while she has been in the hospital.  She states that she exercises  regularly with water aerobics and walks a dog on a daily basis but has  never had chest pain before.   PAST MEDICAL HISTORY:  1. Hypertension for 7 years.  2. Hyperlipidemia for 7 years.  3. History of herniated lumbar disk.  4. History of sciatica.  5. Remote history of irritable bowel.  6. Remote history of polio.  7. History of migraines.   SURGICAL HISTORY:  She is status post cholecystectomy, tonsillectomy,  sinus surgery x2,  D&C, colonoscopy, and an epidural lumbar injection on  April 04, 2008.   ALLERGIES:  No known drug allergies.   CURRENT MEDICATIONS:  1. Elavil 25 mg nightly.  2. Depakote 500 b.i.d.  3. DVT, Lovenox.  4. Metoprolol 25 mg b.i.d.  5. Singulair 10 mg daily.  6. Nitroglycerin paste one-half inch q.6 h.  7. Protonix 40 mg a day.  8. Altace 5 mg daily.  9. Senokot nightly.   SOCIAL HISTORY:  She lives in [place] with her husband.  She has no  history of alcohol, tobacco, or drug abuse.  She is a retired Runner, broadcasting/film/video  and currently housewife.  She works out at [employer] with [employer] 3  days a week and walks her dog for about 30 minutes a day.   FAMILY HISTORY:  Her mother died at age 32 and her father died at age  56, neither one with a history of coronary artery disease.  She is the  only child.   REVIEW OF SYSTEMS:  She had back pain that started on March 13, 2008, for  which she received the epidural injection.  It did have improved  somewhat but she still has quite a bit of difficulty getting around.  She is wearing a TENS unit for sciatica.  She has rare reflux symptoms.  She has some chronic issues with constipation.  There is no history of  dyspnea on exertion.  She is easily fatigued and when she is fatigued,  she will get leg weakness which is secondary to her polio.  She also has  back problems and radiation of pain down her leg, left greater than  right.  Full 14-point review of systems is otherwise negative.   PHYSICAL EXAMINATION:  VITAL SIGNS:  Temperature is 97.2, blood pressure  118/77, pulse 79, respiratory rate 20, and O2 saturation 99% on room  air.  GENERAL:  She is a well-developed, well-nourished white female in no  acute distress.  HEENT:  Normal.  NECK:  There is no JVD, no bruit, no lymphadenopathy, no thyromegaly is  noted.  CHEST:  Clear to auscultation bilaterally.  CARDIOVASCULAR:  Heart is regular in rate and rhythm with no significant  murmur, rub,  or gallop noted.  PMI is not displaced.  ABDOMEN:  Soft and nontender with active bowel sounds and no  splenomegaly by palpation.  MUSCULOSKELETAL:  There is no joint deformity or effusions and no CVA  tenderness although her lumbar spine is slightly tender but this is not  new.  EXTREMITIES:  There is no edema noted.  Distal pulses are intact in all  4 extremities and no femoral bruits are appreciated.  NEUROLOGIC:  She is alert and oriented and has no focal cranial nerve  deficits.  She does have chronic left lower extremity weakness and some  foot drop on that side for which she is seen by Dr. Shelle Iron.   LABORATORY VALUES:  TSH is slightly low at 0.219, hemoglobin 14,  hematocrit 40.2, WBCs 10.9, and platelets 260.  Sodium 132, potassium  3.9, chloride 104, CO2 of 28, BUN 16, creatinine 0.83, glucose 124, CK-  MB and troponin I negative x3.  BNP less than 30, total cholesterol 166,  triglycerides 86, HDL 64, and LDL 85.   Chest x-ray, no acute disease.   EKG:  Sinus rhythm, rate 79 with minimal J-point elevation in V1 and V2  and no acute ischemic changes.  This is essentially unchanged from an  EKG dated 2005.   2D echocardiogram:  EF 50-55% with no regional wall motion abnormalities  and left ventricular wall thickness mildly increased, abnormal left  ventricular relaxation noted by Doppler.  Aortic valve thickness mildly  increased.  No pericardial effusion and no critical valvular  abnormalities.   IMPRESSION:  Chest pain:  Ms. Reifschneider was seen today by Dr. Jens Som.  She is a 60 year old female with past medical history of hyperlipidemia,  hypertension, and back pain.  She developed chest pain on April 06, 2008,  with getting out of a car.  There were no associated symptoms except for  mild nausea.  Her pain was not pleuritic and not positional and not  related to food.  The duration was approximately 1 hour, and she has had  no symptoms since.  There was no history of  exertional chest pain.  Her  EKG is sinus rhythm with no ST changes and her ejection fraction was  normal by echo.  Her  chest pain is somewhat atypical.  Because she has  ruled out and her EKG is not acute, an outpatient Myoview is appropriate  and followup with Dr. Jens Som.  The nitroglycerin paste can be  discontinued.  She needs a workup of her low TSH, and we will start this  by ordering of free T3 and T4, after which she can follow up with  primary care.  No further inpatient cardiac workup is indicated, and her  followup appointments have been set.      Theodore Demark, PA-C      Madolyn Frieze. Jens Som, MD, [facility]  Electronically Signed    RB/MEDQ  D:  04/08/2008  T:  04/09/2008  Job:  [id number]   cc:   Ace Gins, MD

## 2011-02-12 NOTE — Discharge Summary (Signed)
[name], Kristin Gilmore          ACCOUNT NO.:  [id number]   MEDICAL RECORD NO.:  [id number]          PATIENT TYPE:  INP   LOCATION:  2030                         FACILITY:  [facility]   PHYSICIAN:  Altha Harm, MDDATE OF BIRTH:  [birth year]   DATE OF ADMISSION:  04/06/2008  DATE OF DISCHARGE:  04/08/2008                               DISCHARGE SUMMARY   ADDENDUM:   DISCHARGE DISPOSITION:  Home.   Please note this is an addendum to the discharge summary dictated  earlier today.   Please add to discharge diagnoses:   1. Decreased thyroid stimulating hormone at a level of 0.219.  Please      note that this was not pursued as an inpatient and I would      recommend that the patient have a repeat TSH done when she is in      the convalescent stage at her primary care physician's office.  2. Chest pain:  The patient was seen by Dr. Jens Som from [facility]      Cardiology and recommendations were made for stress test as an      outpatient.  The patient will be discharged home to follow up with      Dr. Jens Som at the time designated for the stress test and to      follow up with her primary care physician in one week.      Altha Harm, MD  Electronically Signed     MAM/MEDQ  D:  04/08/2008  T:  04/08/2008  Job:  [id number]

## 2011-02-12 NOTE — Assessment & Plan Note (Signed)
[facility]                            CARDIOLOGY OFFICE NOTE   NAME:Krizek, CLEVIE PROUT                 MRN:          [id number]  DATE:05/05/2008                            DOB:          05-16-1951    Mrs. Moder is a pleasant 60 year old female that was recently  admitted to [facility] with 1 hour of chest pain.  She did rule  out for myocardial infarction with serial enzymes.  She also had a D-  dimer that was normal.  She had an echocardiogram also performed on April 07, 2008.  Her ejection fraction was 50-55%.  There were no significant  valvular abnormalities noted.  She was discharged and ultimately had a  Myoview performed here in the office on April 19, 2008.  Her ejection  fraction was 58% and there was no ischemia or infarction.  Since then  she has not had chest pain, shortness of breath, palpitations, or  syncope.   MEDICATIONS:  Lipitor, Depakote, and Singulair.   PHYSICAL EXAMINATION:  VITAL SIGNS:  Blood pressure 130/80 and her pulse  is 80.  She weighs 188 pounds.  HEENT:  Normal.  NECK:  Supple.  CHEST:  Clear.  CARDIOVASCULAR:  Regular rate and rhythm.  ABDOMEN:  No tenderness.  EXTREMITIES:  No edema.   DIAGNOSES:  1. Recent chest pain - etiology of this remains unclear.  However, her      Myoview was normal.  We will not pursue further ischemia      evaluation.  I have asked to discontinue her Lopressor.  2. History of hyperlipidemia - she will continue on her statin.      Follow up with her primary care physician concerning this issue.  3. Recent decreased TSH - her primary care physician is evaluating      this.  4. Hypertension - blood pressure is adequately controlled.  5. History of headaches.   We will see her back on an as-needed basis.     Madolyn Frieze Jens Som, MD, [facility]  Electronically Signed    BSC/MedQ  DD: 05/05/2008  DT: 05/05/2008  Job #: [id number]

## 2011-02-12 NOTE — H&P (Signed)
Kristin Gilmore, Kristin Gilmore          ACCOUNT NO.:  [id number]   MEDICAL RECORD NO.:  [id number]          PATIENT TYPE:  INP   LOCATION:  2030                         FACILITY:  [facility]   PHYSICIAN:  Herbie Saxon, MDDATE OF BIRTH:  Mar 11, 1951   DATE OF ADMISSION:  04/06/2008  DATE OF DISCHARGE:                              HISTORY & PHYSICAL   PRIMARY CARE PHYSICIAN:  Dr. Larina Bras of [facility].   PRESENTING COMPLAINT:  Chest pain, 1-1/2 hours.   HISTORY OF PRESENTING COMPLAINT:  This is a 60 year old Caucasian lady  with a past medical history of hypertension diagnosed 7 years ago,  hyperlipidemia diagnosed 7 years ago.  She has no allergies.  She was  quite well until last night, when she started having dry cough  associated with diaphoresis.  The patient has also been having  constipation for about 2 days.  She was eating peanuts that night at 7  p.m. when she noticed retrosternal sharp severe chest pain about 8-9/10  in severity.  Chest pain was now radiating to the neck and the arm and  it was constant lasting almost 1 hour.  She took 3 baby aspirins, by the  time the EMS got there she was in a little relief from just about 5-  6/10.  Her blood pressure taken on presentation was elevated at 183/103.  She has never had any previous episode of chest pain.  There was no  palpitation or syncopal episode.  No paroxysmal nocturnal dyspnea,  orthopnea, or pedal swelling.  No hemoptysis.  No fever.  There is no  wheezing.  She did not have any symptoms referable to the  gastrointestinal or genitourinary system.  No skin rash or joint  swelling.   PAST MEDICAL HISTORY:  Hypertension, hyperlipidemia, L3 herniated disk.  She has a TENS unit that was prescribed by chiropractor and orthopedic  doctor.  She also has past medical history of sciatica.   FAMILY HISTORY:  Father with canceroustumor.  Mother has ostearthritis  and hypertension.   PAST SURGICAL HISTORY:   Cholecystectomy and tonsillectomy.   SOCIAL HISTORY:  She is married.  Has one child.  No history of alcohol,  tobacco, or illicit drug abuse.   REVIEW OF SYSTEMS:  14-systems are reviewed, pertinent positives as of  the history of presenting complaints.   ALLERGIES:  No known drug allergies.   MEDICATIONS:  Depakote, amitriptyline, Singulair, Astelin nasal spray,  oxycodone and acetaminophen.   PHYSICAL EXAMINATION:  GENERAL:  She is a middle aged lady not in acute  respiratory distress.  VITAL SIGNS:  Temperature is 98, pulse 89, respiratory rate is 16, and  blood pressure 162/75.  HEENT:  Pupils are equal, reactive to light and accommodation.  Head is  atraumatic and normocephalic.  Mucous membranes are moist.  Oropharynx  and nasopharynx are clear.  NECK:  Supple.  There is no elevated JVD.  No submandibular  lymphadenopathy.  No thyromegaly.  HEART:  No rubs, gallops, or murmurs.  No parasternal heave.S1,S2  regular  CHEST:  Clinically clear.  No rales or rhonchi.  There is no chest wall  tenderness.  ABDOMEN:  Soft and nontender.  No organomegaly.  Bowel sounds  normoactive.  Inguinal orifices are patent.  NEUROLOGIC:  She is alert and oriented in time, place, and person.  Cranial nerves II through XII are intact.  Power is 5 globally.  Mood is  stable.  Memory is intact.  Speech is normal.  SKIN:  No rash.   LABORATORY DATA:  Available labs shows that the troponin is 0.05.  Chest  x-ray shows no active disease.  EKG, normal sinus rhythm at 95 per  minute with nonspecific ST-T changes.   ASSESSMENT:  1. Chest pain, query acute coronary syndrome.  2. Hypertension.  3. Urgency.  4. Hyperlipidemia.   The patient will be admitted to telemetry bed and diet would be heart  healthy, low cholesterol.  Activity, bed rest.  Morphine 2 mg IV q.6 h.  p.r.n. for chest pain, nitro paste 1/2 inch q.6 h., aspirin 325 mg  daily, metoprolol 50 mg b.i.d., Lopressor 12.5 mg IV q.6 h.  p.r.n.  We  will obtain a thyroid function test, BNP, cardiac enzymes /ekg q.8 h.  x3.  Oxygen 2-4 L via nasal cannula p.r.n. to keep sat greater than 90%.  She is to be on Lovenox 40 mg subcu daily, Protonix 20 mg IV daily,  Phenergan 25 mg IV q.8 h. p.r.n., albuterol and Atrovent 1 inhalation  q.6 h. p.r.n. for any wheezing.  We will clarify the doses of home  medication.  Continue on low-dose Depakote, Elavil, Singulair and  Astelin nose spray p.r.n.  Consult of Cardiology include stress test if  cardiac enzymes negative and also do cardiac catheterization if cardiac  enzymes are positive.      Herbie Saxon, MD  Electronically Signed     MIO/MEDQ  D:  04/06/2008  T:  04/07/2008  Job:  [id number]

## 2011-02-15 NOTE — Op Note (Signed)
Kristin Gilmore, Kristin Gilmore                      ACCOUNT NO.:  [id number]   MEDICAL RECORD NO.:  [id number]                   PATIENT TYPE:  AMB   LOCATION:  DAY                                  FACILITY:  [facility]   PHYSICIAN:  Pershing Cox, M.D.            DATE OF BIRTH:  06-15-1951   DATE OF PROCEDURE:  09/10/2002  DATE OF DISCHARGE:                                 OPERATIVE REPORT   PREOPERATIVE DIAGNOSES:  Postmenopausal bleeding and endometrial polyps on  office sonogram.   POSTOPERATIVE DIAGNOSES:  Postmenopausal bleeding and endometrial polyps on  office sonogram.   PROCEDURE:  Examination under anesthesia, fractional D&C hysteroscopy with  resection of small polyps.   ANESTHESIA:  General by LMA.   SURGEON:  Pershing Cox, M.D.   INDICATIONS FOR PROCEDURE:  The patient had an episode of postmenopausal  bleeding and had a sonogram in April which showed normal study, no polyps  and no signs of abnormalities on her ovaries. She subsequently had an  evaluation by Dr. Loreta Ave in August because of severe lower abdominal pain. She  had an abdominopelvic CT scan which showed gallstones and evidence of  uterine myomas and she was referred back to my office to evaluate her  uterus. A sonogram was performed. At this time, her uterine fibroids were  larger in size then in April. She again began having postmenopausal bleeding  in November. She was on Estrace and Prometrium. We had repeated her sonogram  showing the uterus to have small myomas and difficulty with evaluation of  the endometrial canal. Hydrosonogram was performed which showed two small  endometrial polyps. Regarding this, she was counselled for [facility] hysteroscopy  and resection.   OPERATIVE FINDINGS:  In trying to pass the hysteroscope into the canal,  there was a small polyp in the lower uterine segment which obliterated the  cavity. This had to be removed with polyp forceps before I could get into  the cavity.  Then there was a small polyp on the left uterine wall. The  remainder of the cavity showed shaggy endometrium and there were no specific  abnormalities.   DESCRIPTION OF PROCEDURE:  Kristin Gilmore was brought to the operating  room with an IV in place, she received a gram of Ancef in the holding area.  Supine on the OR table, IV sedation was administered and then LMA was  placed. This was replaced with a larger size 4 because there was leakage  around the tube. The patient was then placed into Allen stirrups and exam  under anesthesia was performed. The lower abdomen, perineum, upper thighs  and vagina were prepped with a solution of Hibiclens. A bivalve speculum was  inserted into the vagina and the cervix was visualized.   A 0.25% Marcaine was injected into the anterior cervix. It was then grasped  with a single tooth tenaculum and Marcaine paracervical block was  administered at the 3, 4, 7  and 8 position. A Kevorkian curette was used to  obtain endocervical curettings onto Telfa. The sound then passed to a depth  of 8 cm. Serial Pratt dilators were used to dilate the size 25 and the  diagnostic scope was introduced. As I tried to introduce it there was  obstruction and clearly a polyp in the lower uterine segment blocking the  upper endocervix. The scope was removed and the polyp forceps were  introduced and this polyp was removed with the Randall stone forceps. I was  unable to get into the endometrial canal and satisfactorily photograph it.  It was a small polyp on the left wall just below the ostia. The hysteroscope  was removed and the cervix was dilated to size 35 dilator. The resectoscope  was introduced into the small cavity and using right angled wire the  polypoid like tissue was resected. Next a small sharp curette was used to  curette the uterine walls followed by a serrated curette. These curettings  were also submitted with the polyps for analysis. The sound passed  again to  a depth of 8 cm. The patient tolerated the procedure well and was taken to  the recovery room for recovery.                                                Pershing Cox, M.D.    MAJ/MEDQ  D:  09/10/2002  T:  09/10/2002  Job:  [id number]   cc:   Tammy R. Collins Scotland, M.D.  [address]  [place]  [place] [address]  Fax: [phone]

## 2011-02-15 NOTE — Op Note (Signed)
NAMEELANOR, CALE                      ACCOUNT NO.:  [id number]   MEDICAL RECORD NO.:  [id number]                   PATIENT TYPE:  AMB   LOCATION:  ENDO                                 FACILITY:  [facility]   PHYSICIAN:  Charna Elizabeth, M.D.                   DATE OF BIRTH:  [birth year]   DATE OF PROCEDURE:  06/02/2002  DATE OF DISCHARGE:                                 OPERATIVE REPORT   PROCEDURE PERFORMED:  Colonoscopy.   ENDOSCOPIST:  Charna Elizabeth, M.D.   INSTRUMENT USED:  Olympus video colonoscope.   INDICATIONS FOR PROCEDURE:  Abdominal pain with diffuse abdominal  discomfort, change in bowel habits and trace guaiac positive stools in a 13-  year-old white female.  CT scan showed a small fibroid in the lower uterine  segment.  No acute abnormalities are noted.  Rule out colonic polyps,  masses, hemorrhoids, etc. The patient has had IBS like symptoms in the past.   PREPROCEDURE PREPARATION:  Informed consent was procured from the patient.  The patient was fasted for eight hours prior to the procedure and prepped  with a bottle of magnesium citrate and a gallon of NuLytely the night prior  to the procedure.   PREPROCEDURE PHYSICAL:  The patient had stable vital signs. Neck supple.  Chest clear to auscultation.  S1 and S2 regular.  Abdomen soft with normal  bowel sounds.   DESCRIPTION OF PROCEDURE:  The patient was placed in left lateral decubitus  position and sedated with 100 mg of Demerol and 10 mg of Versed  intravenously.  Once the patient was adequately sedated and maintained on  low flow oxygen and continuous cardiac monitoring, the Olympus video  colonoscope was advanced from the rectum to the cecum and terminal ileum  without difficulty.  The entire colonic mucosa appeared normal with a normal  vascular pattern.  No erosions, ulcerations, masses or polyps were seen.  There was no evidence of diverticulosis.  The terminal ileum appeared normal  and without lesions  or swelling.  There was a small external hemorrhoid seen  on withdrawal of the scope.  Small nonbleeding internal hemorrhoid seen on  retroflexion.   IMPRESSION:  Normal colonoscopy up to the terminal ileum except for small  nonbleeding internal and external hemorrhoids.    RECOMMENDATIONS:  1. A high fiber diet has been recommended for the patient.  2. 15 to 20 gm of fiber in the diet along with a liberal fluid intake has     been advised.  3. Outpatient followup in the next two weeks for further recommendations.                                                   Charna Elizabeth, M.D.    JM/MEDQ  D:  06/02/2002  T:  06/02/2002  Job:  [id number]   cc:   Tammy R. Collins Scotland, M.D.   Pershing Cox, M.D.  [address]  [address]  [place]  [place] [address]  Fax: [phone]

## 2011-02-15 NOTE — Op Note (Signed)
   NAMESUZZANNE, Kristin Gilmore                      ACCOUNT NO.:  [id number]   MEDICAL RECORD NO.:  [id number]                   PATIENT TYPE:  OBV   LOCATION:  0466                                 FACILITY:  [facility]   PHYSICIAN:  Vikki Ports, M.D.         DATE OF BIRTH:  29-Oct-1950   DATE OF PROCEDURE:  04/23/2003  DATE OF DISCHARGE:                                 OPERATIVE REPORT   PREOPERATIVE DIAGNOSIS:  Symptomatic cholelithiasis.   POSTOPERATIVE DIAGNOSIS:  Symptomatic cholelithiasis.   PROCEDURE:  Laparoscopic cholecystectomy with intraoperative cholangiogram.   SURGEON:  Vikki Ports, M.D.   ASSISTANT:  Sheppard Plumber. Earlene Plater, M.D.   ANESTHESIA:  General.   DESCRIPTION OF PROCEDURE:  The patient was taken to the operating room and  placed in the supine position.  After adequate general anesthesia was  induced using endotracheal tube, the abdomen was prepped and draped in the  normal sterile fashion.  Using a transverse infraumbilical incision, I  dissected down to the fascia.  The fascia was opened vertically.  An 0  Vicryl pursestring suture was placed around the fascial defect.  Under  direct visualization, a 10-mm port was placed in the subxiphoid region, and  two 5-mm ports were placed in the right abdomen.  The gallbladder was  identified and retracted cephalad.  It was absolutely completely filled with  stones.  The neck of the gallbladder was retracted laterally, and the cystic  duct, which was quite short and thin, was identified.  A good window was  created posterior to it.  It was proximally clipped on the gallbladder.  Small ductotomy was made.  Through an Angiocath, a Cook catheter was placed,  and cholangiogram was performed which showed good flow into the duodenum,  good filling of the common duct,  and right and left hepatic ducts, without  filling defects.  The gallbladder was then taken off the gallbladder bed  using Bovie electrocautery,  placed in an EndoCatch bag, and brought out  through the umbilical port.  The gallbladder had to be opened, and a number  of stones had to be removed with stone forceps.  The gallbladder and  EndoCatch bag were then removed.  The right upper quadrant was copiously  irrigated.  The infraumbilical fascial defect was closed with the 0  pursestring suture.  Skin incisions were closed with subcuticular 4-0  Monocryl and injected with Marcaine.  Sterile dressings were applied.  The  patient tolerated the procedure well and went to PACU in good condition.                                               Vikki Ports, M.D.    KRH/MEDQ  D:  04/23/2003  T:  04/23/2003  Job:  [id number]

## 2011-02-15 NOTE — Op Note (Signed)
[name], Kristin Gilmore                      ACCOUNT NO.:  [id number]   MEDICAL RECORD NO.:  [id number]                   PATIENT TYPE:  AMB   LOCATION:  DSC                                  FACILITY:  [facility]   PHYSICIAN:  Hermelinda Medicus, M.D.                DATE OF BIRTH:  29-Sep-1951   DATE OF PROCEDURE:  12/13/2003  DATE OF DISCHARGE:                                 OPERATIVE REPORT   PREOPERATIVE DIAGNOSES:  Right ethmoid, left maxillary sinusitis.  History  of pansinusitis.  History of sinus surgery in 1999.   POSTOPERATIVE DIAGNOSES:  Right ethmoid, left maxillary sinusitis.  History  of pansinusitis.  History of sinus surgery in 1999.   OPERATION PERFORMED:  Functional endoscopic sinus surgery, right  ethmoidectomy, left maxillary sinus ostial enlargement with antrostomy with  reduction of turbinates.   SURGEON:  Hermelinda Medicus, M.D.   ANESTHESIA:  General endotracheal.`   ANESTHESIOLOGIST:  Janetta Hora. Gelene Mink, M.D.   DESCRIPTION OF PROCEDURE:  The patient was placed in supine position under  general endotracheal anesthesia, the nose was anesthetized using 1%  Xylocaine with epinephrine 3 mL and topical cocaine 200 mg. Once this was  achieved, we pushed the septum to the left as it tended to be pressing over  to the right.  We then reduced the right middle turbinate by pushing it  medial and then pushed the inferior turbinates lateral to gain more nasal  airway.  The septal perforation was not touched.  The ethmoid sinus was then  entered and using a 0 and 70 degree scope, a large area of granulation  tissue and polypoid debris was removed from the anterosuperior aspect.  Once  this was removed, we could then see up into the frontal sinus using the 70  degree scope and we could see that this was now clear though we had purulent  drainage coming from the frontal sinus.  We did not culture as this patient  had been on Levaquin.  We examined the remainder of the ethmoid  sinus which  was felt to be in better condition and we removed all polyps seen.  Gelfoam  was then placed in the ethmoid sinus and Gelfilm was used to keep the middle  turbinate medial.  The left maxillary sinus was then approached and the  natural ostium was then enlarged.  Then __________ and antrostomy was also  completed so we could remove the debris from the floor of the maxillary  sinus which we did using the 0 and 70 degree scope.  Once we completed this  and the Merocel packs were placed with  antibiotic ointment, the patient tolerated the procedure well and is doing  well postoperatively.  Her follow-up will be tomorrow in the morning and  then in one week and then three weeks, four weeks, six weeks, three months,  six months and a year.  Hermelinda Medicus, M.D.    JC/MEDQ  D:  12/13/2003  T:  12/14/2003  Job:  [id number]   cc:   Santina Evans A. Orlin Hilding, M.D.  [address]  [address]  [place]  [place] [address]  Fax: [phone]   Marjory Lies, M.D.  [address]  [place]  [place] [address]  Fax: [phone]

## 2011-02-15 NOTE — H&P (Signed)
Kristin Gilmore                      ACCOUNT NO.:  [id number]   MEDICAL RECORD NO.:  [id number]                   PATIENT TYPE:  AMB   LOCATION:  DSC                                  FACILITY:  [facility]   PHYSICIAN:  Hermelinda Medicus, M.D.                DATE OF BIRTH:  November 01, 1950   DATE OF ADMISSION:  12/13/2003  DATE OF DISCHARGE:                                HISTORY & PHYSICAL   This patient is a 60 year old female whom I did sinus surgery on, did  bilateral ethmoidectomy, left maxillary sinus osteal enlargement,  antrostomies, and polypectomy in 1999.  She has done extremely well.  She  had been on considerable antibiotics in the past.  She had been on Levaquin  back in1999 and then had considerable improvement. She had evaluation done  in 2003 which looked quite good.  She had some headache and infection in the  past, but these were easy relatively to control.  However more recently, she  has had persistent frontal sinus headache with sinus congestion.  She has  been on progressive antibiotics once again, using decongestants, Prolix PD,  has been through Biaxin and has also had further antibiotic care and is now  on Levaquin.   A CT scan was obtained, and she showed progressive left maxillary and right  anterior ethmoid sinus congestion with history of acute bacterial sinusitis.  This is exactly where her persistent headaches have been, primarily in the  frontal region.  She has seen two neurologists,  Dr. Orlin Hilding and Dr. Tilman Neat, for headache problem, and it is my opinion also her sinus problems  are aggravating the migraine problem.  She now enters for a right  ethmoidectomy revision and a left maxillary sinus osteal enlargement  revision of her sinuses six years after her previous surgery.  She also has  a septal deviation with a septal perforation of years ago and does have a  mild septal deviation to the right, but I would not touch this in  consideration of a  septal perforation for fear of her losing some nasal  height.   PAST MEDICAL HISTORY:  1. Irritable bowel syndrome.  2. History of polio   Otherwise, her history is excellent.   ALLERGIES:  She has no allergies to medications.   MEDICATIONS:  1. Maxzide 25 mg.  2. Depakote 500 mg daily  3. Elavil 50 mg h. s.  4. Premphase.   PAST SURGICAL HISTORY:  1. Nasal history.  2. D&C x 2.  3. Gallbladder.   PHYSICAL EXAMINATION:  VITAL SIGNS:  Blood pressure 130/74.  Weight 160.  Height 5 feet 7-1/2 inches.  Heart rate 104.  HEENT:  Ears are clear.  Oral cavity is clear.  Nose shows a turbinate  hypertrophy with erythema with a mild septal deviation to the right.  The  left side shows some drainage.  We see some purulent drainage even in the  face of Levaquin.  Previously, she was using Biaxin.  She has also used her  decongestant nasal sprays and decongestants.  The ethmoid sinus is the area  of infection and irritation on the right side especially.  The left  maxillary is the other involved sinus seen on CT scan.  The oral cavity is  clear.  The larynx is clear.  True cords, false cords, epiglottis, base of  tongue are clear.  CHEST: Clear.  No rales, rhonchi, or wheeze.  She also has some cough today  which is probably secondary to sinus drainage.  ABDOMEN:  Unremarkable.  EXTREMITIES:  Unremarkable.   INITIAL DIAGNOSES:  1  Right ethmoid, left maxillary sinusitis.  1. Status post sinusitis.  2. Status post sinus surgery.  3. Status post nasal septal deviation.  4. Status post septal perforation.  5. Status post gallbladder surgery.  6. Status post dilatation and curettage x 2.  7. Persistent sinus headache and migraine.                                                Hermelinda Medicus, M.D.    JC/MEDQ  D:  12/13/2003  T:  12/14/2003  Job:  [id number]   cc:   Marjory Lies, M.D.  [address]  [place]  [place] [address]  Fax: [phone]   Gustavus Messing. Orlin Hilding, M.D.  [address]  [address]  [place]  [place] [address]  Fax: [phone]   Clabe Seal. Meryl Crutch, M.D.  [address]  [place]  [place] [address]  Fax: [phone]

## 2011-03-18 ENCOUNTER — Other Ambulatory Visit (HOSPITAL_COMMUNITY): Payer: Self-pay | Admitting: Family Medicine

## 2011-03-18 DIAGNOSIS — Z1231 Encounter for screening mammogram for malignant neoplasm of breast: Secondary | ICD-10-CM

## 2011-03-28 ENCOUNTER — Ambulatory Visit (HOSPITAL_COMMUNITY)
Admission: RE | Admit: 2011-03-28 | Discharge: 2011-03-28 | Disposition: A | Discharge status: Home / Self Care | Payer: PRIVATE HEALTH INSURANCE | Source: Ambulatory Visit | Attending: Family Medicine | Admitting: Family Medicine

## 2011-03-28 DIAGNOSIS — Z1231 Encounter for screening mammogram for malignant neoplasm of breast: Secondary | ICD-10-CM | POA: Insufficient documentation

## 2011-06-27 LAB — BASIC METABOLIC PANEL
BUN: 16
CO2: 28
Chloride: 104
Creatinine, Ser: 0.83

## 2011-06-27 LAB — COMPREHENSIVE METABOLIC PANEL
ALT: 35
AST: 27
Albumin: 3.7
Alkaline Phosphatase: 73
BUN: 18
Calcium: 8.2 — ABNORMAL LOW
Chloride: 96
GFR calc Af Amer: >60
GFR calc non Af Amer: >60
Potassium: 3.7
Sodium: 145
Total Protein: 6.2

## 2011-06-27 LAB — POCT I-STAT, CHEM 8
Calcium, Ion: 1.16
Chloride: 101
Creatinine, Ser: 0.9
HCT: 43
TCO2: 31

## 2011-06-27 LAB — CK TOTAL AND CKMB (NOT AT ARMC)
CK, MB: 2.5
Relative Index: INVALID
Total CK: 98

## 2011-06-27 LAB — LIPID PANEL
HDL: 64
Triglycerides: 86
VLDL: 17

## 2011-06-27 LAB — CBC
HCT: 40.2
Hemoglobin: 13.2
MCV: 91.3
Platelets: 274
RBC: 4.41
RDW: 13.9
WBC: 10.9 — ABNORMAL HIGH
WBC: 11.6 — ABNORMAL HIGH

## 2011-06-27 LAB — URINALYSIS, MICROSCOPIC ONLY
Hgb urine dipstick: NEGATIVE
Nitrite: NEGATIVE
Urobilinogen, UA: 0.2

## 2011-06-27 LAB — POCT CARDIAC MARKERS: Myoglobin, poc: 39.3

## 2011-06-27 LAB — D-DIMER, QUANTITATIVE: D-Dimer, Quant: <0.22

## 2011-06-27 LAB — TSH: TSH: 0.219 — ABNORMAL LOW (ref 0.350–4.500)

## 2011-06-27 LAB — APTT: aPTT: 25

## 2011-06-27 LAB — CARDIAC PANEL(CRET KIN+CKTOT+MB+TROPI): Troponin I: <0.01

## 2012-04-20 ENCOUNTER — Other Ambulatory Visit (HOSPITAL_COMMUNITY): Payer: Self-pay | Admitting: Obstetrics

## 2012-04-20 DIAGNOSIS — Z1231 Encounter for screening mammogram for malignant neoplasm of breast: Secondary | ICD-10-CM

## 2012-04-24 ENCOUNTER — Ambulatory Visit (HOSPITAL_COMMUNITY)
Admission: RE | Admit: 2012-04-24 | Discharge: 2012-04-24 | Disposition: A | Discharge status: Home / Self Care | Payer: Self-pay | Source: Ambulatory Visit | Attending: Obstetrics | Admitting: Obstetrics

## 2012-04-24 DIAGNOSIS — Z1231 Encounter for screening mammogram for malignant neoplasm of breast: Secondary | ICD-10-CM

## 2013-03-30 ENCOUNTER — Other Ambulatory Visit (HOSPITAL_COMMUNITY): Payer: Self-pay | Admitting: Obstetrics

## 2013-03-30 DIAGNOSIS — Z1231 Encounter for screening mammogram for malignant neoplasm of breast: Secondary | ICD-10-CM

## 2013-04-26 ENCOUNTER — Ambulatory Visit (HOSPITAL_COMMUNITY)
Admission: RE | Admit: 2013-04-26 | Discharge: 2013-04-26 | Disposition: A | Discharge status: Home / Self Care | Payer: BC Managed Care – PPO | Source: Ambulatory Visit | Attending: Obstetrics | Admitting: Obstetrics

## 2013-04-26 DIAGNOSIS — Z1231 Encounter for screening mammogram for malignant neoplasm of breast: Secondary | ICD-10-CM | POA: Insufficient documentation

## 2014-04-13 ENCOUNTER — Encounter: Payer: Self-pay | Admitting: Neurology

## 2014-04-13 ENCOUNTER — Ambulatory Visit (INDEPENDENT_AMBULATORY_CARE_PROVIDER_SITE_OTHER): Payer: BC Managed Care – PPO | Admitting: Neurology

## 2014-04-13 ENCOUNTER — Telehealth: Payer: Self-pay | Admitting: Neurology

## 2014-04-13 ENCOUNTER — Encounter (INDEPENDENT_AMBULATORY_CARE_PROVIDER_SITE_OTHER): Payer: Self-pay

## 2014-04-13 VITALS — BP 127/73 | HR 81 | Ht 67.0 in | Wt 189.0 lb

## 2014-04-13 DIAGNOSIS — R251 Tremor, unspecified: Secondary | ICD-10-CM | POA: Insufficient documentation

## 2014-04-13 DIAGNOSIS — D353 Benign neoplasm of craniopharyngeal duct: Secondary | ICD-10-CM

## 2014-04-13 DIAGNOSIS — D352 Benign neoplasm of pituitary gland: Secondary | ICD-10-CM

## 2014-04-13 DIAGNOSIS — R259 Unspecified abnormal involuntary movements: Secondary | ICD-10-CM

## 2014-04-13 DIAGNOSIS — G43009 Migraine without aura, not intractable, without status migrainosus: Secondary | ICD-10-CM

## 2014-04-13 HISTORY — DX: Migraine without aura, not intractable, without status migrainosus: G43.009

## 2014-04-13 HISTORY — DX: Tremor, unspecified: R25.1

## 2014-04-13 HISTORY — DX: Benign neoplasm of pituitary gland: D35.2

## 2014-04-13 MED ORDER — VALPROIC ACID 250 MG PO CAPS: ORAL_CAPSULE | ORAL | Status: DC

## 2014-04-13 MED ORDER — TRAMADOL HCL 50 MG PO TABS: 50.0000 mg | ORAL_TABLET | Freq: Four times a day (QID) | ORAL | Status: DC | PRN

## 2014-04-13 MED ORDER — VALPROIC ACID 500 MG PO CPDR: DELAYED_RELEASE_CAPSULE | ORAL | Status: DC

## 2014-04-13 MED ORDER — TIZANIDINE HCL 2 MG PO TABS: 2.0000 mg | ORAL_TABLET | Freq: Three times a day (TID) | ORAL | Status: DC

## 2014-04-13 NOTE — Progress Notes (Signed)
Reason for visit: Headache, tremor  Kristin Gilmore is a 63 y.o. female  History of present illness:  Kristin Gilmore is a 63 year old right-handed white female with a lifelong history of headaches. The patient has had headaches off and on since her early 45s, and she indicates that she will have episodes of headaches that are daily in nature and she may go several years without any headaches whatsoever. The patient is entered a cycle of headaches that began in May of 2015. The headaches are primarily frontal in nature, associated with a throbbing pain. In general, the headaches are better in the morning, worse as the day goes on. The patient reports some right-sided neck discomfort, and she is seeing a chiropractor for this issue. The patient was seen by Dr. Sima Matas for management of her headache. She has been placed on Topamax, currently on 100 mg daily, and she is on a total of 750 mg daily of Depakote. In the past, the patient believes that Depakote helped her with her headaches. The Depakote is not helpful at this time. The patient denies nausea or vomiting with the headache, but she does report some decreased appetite. She indicates that there is minimal photophobia and phonophobia, and she indicates that eating something or lying down will help her headache. The patient has some reported neck stiffness, but no headaches coming up from the back of the neck. The patient does report some dizziness associated with the headache. She reports that her mother also had a history of headache.  The tremor has been present in both arms, right equal to left, as a resting tremor over the last 2 years. The patient indicates that the tremor does not impact her ability to perform fine motor tasks such as feeding herself or with handwriting. She denies any tremor of the head or neck or with the voice. Over the years, the patient has had some slight worsening of the tremor. The patient in the past has been on  Risperdal, but she is currently on Seroquel.  Past Medical History  Diagnosis Date  . Migraine   . Depression   . Hypertension   . Dyslipidemia   . Tremor 04/13/2014  . Migraine without aura, without mention of intractable migraine without mention of status migrainosus 04/13/2014  . 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 Cholecystectomy    . Nasal sinus surgery      Family History  Problem Relation Age of Onset  . Cancer Father     stomach cancer  . Migraines Mother     Social history:  reports that she has never smoked. She has never used smokeless tobacco. She reports that she does not drink alcohol or use illicit drugs.  Medications:  No current outpatient prescriptions on file prior to visit.   No current facility-administered medications on file prior to visit.     No Known Allergies  ROS:  Out of a complete 14 system review of symptoms, the patient complains only of the following symptoms, and all other reviewed systems are negative.  Fatigue Headache, dizziness, tremor  Blood pressure 127/73, pulse 81, height 5\' 7"  (1.702 m), weight 189 lb (85.73 kg).  Physical Exam  General: The patient is alert and cooperative at the time of the examination. The patient is moderately obese.  Eyes: Pupils are equal, round, and reactive to light. Discs are flat bilaterally.  Neck: The neck is supple, no carotid bruits are  noted.  Respiratory: The respiratory examination is clear.  Cardiovascular: The cardiovascular examination reveals a regular rate and rhythm, no obvious murmurs or rubs are noted.  Neuromuscular: The patient has no crepitus in the temporomandibular joints. The patient lacks about 10 of lateral rotation of the cervical spine bilaterally.  Skin: Extremities are without significant edema.  Neurologic Exam  Mental status: The patient is alert and oriented x 3 at the time of the examination. The patient  has apparent normal recent and remote memory, with an apparently normal attention span and concentration ability.  Cranial nerves: Facial symmetry is present. There is good sensation of the face to pinprick and soft touch bilaterally. The strength of the facial muscles and the muscles to head turning and shoulder shrug are normal bilaterally. Speech is well enunciated, no aphasia or dysarthria is noted. Extraocular movements are full. Visual fields are full. The tongue is midline, and the patient has symmetric elevation of the soft palate. No obvious hearing deficits are noted. The patient does have somewhat of a flat affect.  Motor: The motor testing reveals 5 over 5 strength of all 4 extremities. Good symmetric motor tone is noted throughout.  Sensory: Sensory testing is intact to pinprick, soft touch, vibration sensation, and position sense on all 4 extremities. No evidence of extinction is noted.  Coordination: Cerebellar testing reveals good finger-nose-finger and heel-to-shin bilaterally. Intermittently, a resting tremor is noted on the left hand. No significant tremor is seen with finger-nose-finger.  Gait and station: Gait is normal. Tandem gait is slightly unsteady. Romberg is negative. No drift is seen.  Reflexes: Deep tendon reflexes are symmetric and normal bilaterally. Toes are downgoing bilaterally.   Assessment/Plan:  1. History of headache  2. Resting tremor  The patient is having ongoing daily headaches since May of 2015. Apparently, this pattern follows her usual headache episodes which may go on daily for several months. The patient indicates that she was treated with prednisone at the onset of the headache without much benefit. She is on a low dose of Depakote which has helped in the past. The patient will be increased on Depakote taking 500 mg twice daily for one week, and then go to 500 mg the morning, 1000 mg in the evening. The patient will be placed on tizanidine taking  2 mg 3 times daily. She will continue with her chiropractic therapies on her neck. She will followup through this office in about 6 weeks. The patient will be placed on Ultram if needed for pain.   Kristin Alexanders MD 04/13/2014 7:57 PM  [facility] [address] [place], [place] [address]  Phone [phone] Fax [phone]

## 2014-04-13 NOTE — Telephone Encounter (Signed)
I called the pharmacy.  Spoke with [place].  He said the patient was complaining about the cost of the Tizanidine which was $18, but reluctantly paid for and picked up the medication.  As well they picked up Tramadol for $3.   Says on another note, they did not have the Valproic Acid in stock and checked with their warehouse.  They are only able to get this drug in one manufacturer, and the cost of that would be ~$400.  He would like to know if there is something comparable the Rx could be changed to.  Please advise.  Thank you.

## 2014-04-13 NOTE — Patient Instructions (Signed)
  With the Valproic acid, go to 500 mg twice a day for one week, then take one in the morning and two in the evening.     Migraine Headache A migraine headache is an intense, throbbing pain on one or both sides of your head. A migraine can last for 30 minutes to several hours. CAUSES  The exact cause of a migraine headache is not always known. However, a migraine may be caused when nerves in the brain become irritated and release chemicals that cause inflammation. This causes pain. Certain things may also trigger migraines, such as:  Alcohol.  Smoking.  Stress.  Menstruation.  Aged cheeses.  Foods or drinks that contain nitrates, glutamate, aspartame, or tyramine.  Lack of sleep.  Chocolate.  Caffeine.  Hunger.  Physical exertion.  Fatigue.  Medicines used to treat chest pain (nitroglycerine), birth control pills, estrogen, and some blood pressure medicines. SIGNS AND SYMPTOMS  Pain on one or both sides of your head.  Pulsating or throbbing pain.  Severe pain that prevents daily activities.  Pain that is aggravated by any physical activity.  Nausea, vomiting, or both.  Dizziness.  Pain with exposure to bright lights, loud noises, or activity.  General sensitivity to bright lights, loud noises, or smells. Before you get a migraine, you may get warning signs that a migraine is coming (aura). An aura may include:  Seeing flashing lights.  Seeing bright spots, halos, or zig-zag lines.  Having tunnel vision or blurred vision.  Having feelings of numbness or tingling.  Having trouble talking.  Having muscle weakness. DIAGNOSIS  A migraine headache is often diagnosed based on:  Symptoms.  Physical exam.  A CT scan or MRI of your head. These imaging tests cannot diagnose migraines, but they can help rule out other causes of headaches. TREATMENT Medicines may be given for pain and nausea. Medicines can also be given to help prevent recurrent migraines.   HOME CARE INSTRUCTIONS  Only take over-the-counter or prescription medicines for pain or discomfort as directed by your health care provider. The use of long-term narcotics is not recommended.  Lie down in a dark, quiet room when you have a migraine.  Keep a journal to find out what may trigger your migraine headaches. For example, write down:  What you eat and drink.  How much sleep you get.  Any change to your diet or medicines.  Limit alcohol consumption.  Quit smoking if you smoke.  Get 7-9 hours of sleep, or as recommended by your health care provider.  Limit stress.  Keep lights dim if bright lights bother you and make your migraines worse. SEEK IMMEDIATE MEDICAL CARE IF:   Your migraine becomes severe.  You have a fever.  You have a stiff neck.  You have vision loss.  You have muscular weakness or loss of muscle control.  You start losing your balance or have trouble walking.  You feel faint or pass out.  You have severe symptoms that are different from your first symptoms. MAKE SURE YOU:   Understand these instructions.  Will watch your condition.  Will get help right away if you are not doing well or get worse. Document Released: 09/16/2005 Document Revised: 07/07/2013 Document Reviewed: 05/24/2013 [employer] Patient Information 2015 [place], [place]. This information is not intended to replace advice given to you by your health care provider. Make sure you discuss any questions you have with your health care provider.

## 2014-04-13 NOTE — Telephone Encounter (Signed)
I called patient. The patient is unable to afford the 500 mg Depakote tablets. I will call in the 250 mg Depakene tablets, and she will take 2 in the morning, 4 in the evening. This is cheaper for the patient.

## 2014-04-13 NOTE — Telephone Encounter (Signed)
Spouse questioning if there's alternative medication for Tranidine.  Cost is too expensive.  Please call and advise.  Thanks

## 2014-04-15 ENCOUNTER — Telehealth: Payer: Self-pay | Admitting: Neurology

## 2014-04-15 MED ORDER — KETOROLAC TROMETHAMINE 10 MG PO TABS: 10.0000 mg | ORAL_TABLET | Freq: Three times a day (TID) | ORAL | Status: DC | PRN

## 2014-04-15 NOTE — Telephone Encounter (Signed)
Patient reports that Tramadol 50 mg is not helping with her headaches. Can something else be prescribed?

## 2014-04-15 NOTE — Telephone Encounter (Signed)
Patient calling to state that pain medication for her headaches is not working, please return call to patient and advise.

## 2014-04-15 NOTE — Telephone Encounter (Signed)
I called patient. She is still having headaches, Ultram is not effective. I will call in some Toradol to see if this helps.

## 2014-04-19 ENCOUNTER — Telehealth: Payer: Self-pay | Admitting: *Deleted

## 2014-04-19 NOTE — Telephone Encounter (Signed)
Patient's husband said that pharmacist told her that the Ketorolac(Toradol) is a five day prescription,and it is noted in pharmacy pamplet as a caution -not to take more than five days. Should she take longer?

## 2014-04-19 NOTE — Telephone Encounter (Signed)
I called patient. The patient is not to take ketorolac for extended periods of time. She is only taking on average one a day instead of 3 or 4 day. What information she was given by the pharmacist as correct. The patient is still having daily headaches, having some dizziness on the increased dose of Depakote.

## 2014-04-21 ENCOUNTER — Telehealth: Payer: Self-pay | Admitting: Neurology

## 2014-04-21 DIAGNOSIS — R51 Headache: Secondary | ICD-10-CM

## 2014-04-21 MED ORDER — TAPENTADOL HCL 100 MG PO TABS: 100.0000 mg | ORAL_TABLET | Freq: Four times a day (QID) | ORAL | Status: DC | PRN

## 2014-04-21 NOTE — Telephone Encounter (Signed)
I called patient. The patient still having headaches, not responding to Ultram or Toradol. We will give her a trial on Nucynta. The patient has not had MRI evaluation the brain in 5-7 years. I will set her up for an MRI. He will need Xanax for this study.

## 2014-04-21 NOTE — Telephone Encounter (Signed)
Patient stated ketorolac (TORADOL) 10 MG tablet is not helping with Migraine.  Requesting something different to help with pain.

## 2014-04-22 ENCOUNTER — Telehealth: Payer: Self-pay | Admitting: Neurology

## 2014-04-22 NOTE — Telephone Encounter (Signed)
Patient's husband calling--has questions about the medication that is ready at the front desk to pick up for patient--what is it for?--also has a copy of an MRI that was done in 2004 in [place]--do we need it?

## 2014-04-22 NOTE — Telephone Encounter (Signed)
Placed xanax at front desk (3 tablet packet).

## 2014-04-22 NOTE — Telephone Encounter (Signed)
Called pt and spoke with pt's husband [name], informing him that the pt's Rx was ready to be picked up at the front desk and if the pt has any other problems, questions or concerns to call the office. Husband verbalized understanding.

## 2014-04-22 NOTE — Telephone Encounter (Signed)
Called pt and pt wanted me to speak with her husband [name] to inform him that the Rx that was left at the front desk was for the pt's headaches and that someone will be getting in contact with him to set up an appt for the MRI and to answer any questions that they might have. I advised the husband that if the pt has any other problems, questions or concerns to call the office. Pt verbalized understanding.

## 2014-04-23 ENCOUNTER — Telehealth: Payer: Self-pay | Admitting: Neurology

## 2014-04-23 NOTE — Telephone Encounter (Signed)
MRI brain from 12/2002 was reviewed, and this appears to be unremarkable.

## 2014-05-27 ENCOUNTER — Ambulatory Visit (INDEPENDENT_AMBULATORY_CARE_PROVIDER_SITE_OTHER): Payer: BC Managed Care – PPO | Admitting: Neurology

## 2014-05-27 ENCOUNTER — Encounter: Payer: Self-pay | Admitting: Neurology

## 2014-05-27 VITALS — BP 142/91 | HR 78 | Wt 185.0 lb

## 2014-05-27 DIAGNOSIS — R5383 Other fatigue: Secondary | ICD-10-CM

## 2014-05-27 DIAGNOSIS — G43009 Migraine without aura, not intractable, without status migrainosus: Secondary | ICD-10-CM

## 2014-05-27 DIAGNOSIS — Z5181 Encounter for therapeutic drug level monitoring: Secondary | ICD-10-CM

## 2014-05-27 DIAGNOSIS — R5381 Other malaise: Secondary | ICD-10-CM

## 2014-05-27 DIAGNOSIS — R259 Unspecified abnormal involuntary movements: Secondary | ICD-10-CM

## 2014-05-27 DIAGNOSIS — R251 Tremor, unspecified: Secondary | ICD-10-CM

## 2014-05-27 NOTE — Patient Instructions (Signed)

## 2014-05-27 NOTE — Progress Notes (Signed)
Reason for visit: Headache  Kristin Gilmore is an 63 y.o. female  History of present illness:  Kristin Gilmore is a 63 year old right-handed white female with a history of daily headaches that have occurred since May of 2013. The patient indicates that her headaches are improving, and she is having 3-4 headache free days a week at this point. The patient however, mainly complains of problems with malaise and fatigue that have been present since May of 2015. The patient is sleeping 18 hours a day, and still feeling fatigued. She tries to engage in some physical activity, such as going to the swimming pool. The patient feels poorly in general. She indicates that she had some blood work done through her primary care physician one month ago, but she does not know what lab work was drawn. The patient claims of some problems with memory and concentration. She states that she does not snore at night. The patient will occasionally nap during the day. She comes to this office for an evaluation.  Past Medical History  Diagnosis Date  . Migraine   . Depression   . Hypertension   . Dyslipidemia   . Tremor 04/13/2014  . Migraine without aura, without mention of intractable migraine without mention of status migrainosus 04/13/2014  . 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 Cholecystectomy    . Nasal sinus surgery      Family History  Problem Relation Age of Onset  . Cancer Father     stomach cancer  . Migraines Mother     Social history:  reports that she has never smoked. She has never used smokeless tobacco. She reports that she does not drink alcohol or use illicit drugs.   No Known Allergies  Medications:  Current Outpatient Prescriptions on File Prior to Visit  Medication Sig Dispense Refill  . acetaminophen (TYLENOL) 500 MG tablet Take 500 mg by mouth every 6 (six) hours as needed.      Marland Kitchen aspirin 81 MG chewable tablet Chew 81 mg  by mouth daily.      . calcium-vitamin D (OSCAL WITH D) 500-200 MG-UNIT per tablet Take 1 tablet by mouth daily.      . Cholecalciferol (VITAMIN D3) 2000 UNITS TABS Take 1 tablet by mouth daily.      Marland Kitchen co-enzyme Q-10 50 MG capsule Take 50 mg by mouth daily.      Marland Kitchen dicyclomine (BENTYL) 20 MG tablet Take 20 mg by mouth every 6 (six) hours as needed.       Marland Kitchen glucosamine-chondroitin 500-400 MG tablet Take 1 tablet by mouth 3 (three) times daily.      . Lactobacillus (MORE-DOPHILUS ACIDOPHILUS PO) Take 1 capsule by mouth daily.      Marland Kitchen LORazepam (ATIVAN) 0.5 MG tablet Take 0.5 mg by mouth at bedtime as needed.      . Magnesium 250 MG TABS Take 1.5 tablets by mouth daily.      . Melatonin 3 MG TABS Take 3 mg by mouth daily.      . metoprolol (LOPRESSOR) 50 MG tablet Take 50 mg by mouth 2 (two) times daily.      . QUEtiapine (SEROQUEL) 300 MG tablet Take 300 mg by mouth at bedtime.      . Riboflavin (VITAMIN B-2) 25 MG TABS Take 1 capsule by mouth daily.      . Tapentadol HCl (NUCYNTA) 100 MG TABS Take 1 tablet (100 mg total)  by mouth every 6 (six) hours as needed.  50 tablet  0  . tiZANidine (ZANAFLEX) 2 MG tablet Take 1 tablet (2 mg total) by mouth 3 (three) times daily.  90 tablet  1  . topiramate (TOPAMAX) 25 MG tablet Take 4 tablets by mouth at bedtime.      . Turmeric 500 MG CAPS Take 2 capsules by mouth daily.      Marland Kitchen valproic acid (DEPAKENE) 250 MG capsule Two tablets in the morning and 4 tablets in the evening  180 capsule  1  . vitamin E 400 UNIT capsule Take 400 Units by mouth daily.       No current facility-administered medications on file prior to visit.    ROS:  Out of a complete 14 system review of symptoms, the patient complains only of the following symptoms, and all other reviewed systems are negative.  Memory loss Fatigue, malaise  Blood pressure 142/91, pulse 78, weight 185 lb (83.915 kg).  Physical Exam  General: The patient is alert and cooperative at the time of the  examination. The patient is moderately obese.  Skin: No significant peripheral edema is noted.   Neurologic Exam  Mental status: The patient is oriented x 3.  Cranial nerves: Facial symmetry is present. Speech is normal, no aphasia or dysarthria is noted. Extraocular movements are full. Visual fields are full.  Motor: The patient has good strength in all 4 extremities.  Sensory examination: Soft touch sensation is symmetric on the face, arms, and legs.  Coordination: The patient has good finger-nose-finger and heel-to-shin bilaterally. The patient has a mild intention tremor with finger-nose-finger bilaterally.  Gait and station: The patient has a normal gait. Tandem gait is slightly unsteady. Romberg is negative. No drift is seen.  Reflexes: Deep tendon reflexes are symmetric.    Assessment/Plan:  1. History of headaches, improving  2. Malaise, fatigue  The patient is feeling poorly, some further blood work was done today. The patient could potentially be suffering from depression. The patient was set up for MRI evaluation of the brain, but this was never done because the patient indicated that she could not afford the study. The patient will followup in 3-4 months. The patient may require a reevaluation for her depression. The patient is on Seroquel currently.  Jill Alexanders MD 05/27/2014 1:24 PM  [facility] [address] [place], [place] [address]  Phone [phone] Fax [phone]

## 2014-05-28 LAB — COMPREHENSIVE METABOLIC PANEL
A/G RATIO: 2.1 (ref 1.1–2.5)
ALK PHOS: 66 IU/L (ref 39–117)
ALT: 28 IU/L (ref 0–32)
AST: 30 IU/L (ref 0–40)
Albumin: 4.5 g/dL (ref 3.6–4.8)
BUN / CREAT RATIO: 18 (ref 11–26)
BUN: 21 mg/dL (ref 8–27)
CO2: 24 mmol/L (ref 18–29)
Calcium: 9.6 mg/dL (ref 8.7–10.3)
Chloride: 97 mmol/L (ref 97–108)
Creatinine, Ser: 1.18 mg/dL — ABNORMAL HIGH (ref 0.57–1.00)
GFR, EST AFRICAN AMERICAN: 56 mL/min/{1.73_m2} — AB (ref >59)
GFR, EST NON AFRICAN AMERICAN: 49 mL/min/{1.73_m2} — AB (ref >59)
GLOBULIN, TOTAL: 2.1 g/dL (ref 1.5–4.5)
Glucose: 189 mg/dL — ABNORMAL HIGH (ref 65–99)
Potassium: 4.9 mmol/L (ref 3.5–5.2)
SODIUM: 142 mmol/L (ref 134–144)
Total Bilirubin: 0.4 mg/dL (ref 0.0–1.2)
Total Protein: 6.6 g/dL (ref 6.0–8.5)

## 2014-05-28 LAB — TSH: TSH: 2.54 u[IU]/mL (ref 0.450–4.500)

## 2014-05-28 LAB — SEDIMENTATION RATE: Sed Rate: 2 mm/hr (ref 0–40)

## 2014-05-28 LAB — VALPROIC ACID LEVEL: Valproic Acid Lvl: 113 ug/mL — ABNORMAL HIGH (ref 50–100)

## 2014-05-28 LAB — VITAMIN B12: Vitamin B-12: 797 pg/mL (ref 211–946)

## 2014-05-28 LAB — AMMONIA: Ammonia: 73 ug/dL (ref 19–87)

## 2014-05-29 ENCOUNTER — Telehealth: Payer: Self-pay | Admitting: Neurology

## 2014-05-29 NOTE — Telephone Encounter (Signed)
I called the patient. The blood work looked OK, except that there is mild CRI, and the BS is elevated, but the patient had just eaten prior to the blood draw.

## 2014-06-11 ENCOUNTER — Other Ambulatory Visit: Payer: Self-pay | Admitting: Neurology

## 2014-06-13 ENCOUNTER — Other Ambulatory Visit: Payer: Self-pay

## 2014-06-13 ENCOUNTER — Telehealth: Payer: Self-pay | Admitting: Neurology

## 2014-06-13 MED ORDER — TAPENTADOL HCL 100 MG PO TABS: 100.0000 mg | ORAL_TABLET | Freq: Four times a day (QID) | ORAL | Status: DC | PRN

## 2014-06-13 NOTE — Telephone Encounter (Signed)
Patient calling to state that she would like a sooner appointment, says that she has migraines and she just came back from vacation and is "all mixed up." States she tried to call her husband and work and he has been retired for 8 years. Patient would like sooner appointment, please call and advise.

## 2014-06-14 ENCOUNTER — Telehealth: Payer: Self-pay | Admitting: Neurology

## 2014-06-14 DIAGNOSIS — Z5181 Encounter for therapeutic drug level monitoring: Secondary | ICD-10-CM

## 2014-06-14 NOTE — Telephone Encounter (Signed)
The patient indicates some problems with confusion, we will try to get her worked in within the next several weeks.

## 2014-06-14 NOTE — Telephone Encounter (Signed)
Called pt to inform her that her Rx was ready to be picked up at the front desk and if she has any other problems, questions or concerns to call the office. Pt verbalized understanding. °

## 2014-06-14 NOTE — Telephone Encounter (Signed)
Kristin Gilmore was just seen 7/15, and 8/28. Her next office visit 12/29 and she is requesting a sooner appointment than that. She reports just coming back off vacation and experiencing extreme confusion. Please advise.

## 2014-06-14 NOTE — Telephone Encounter (Signed)
Patient's husband calling to request a phone call from Dr. Jannifer Franklin to discuss patient's current memory problems, states that patient also currently has diarrhea. Patient's husband wants to discuss with Dr. Jannifer Franklin possible medication side effects before patient goes to see PCP.

## 2014-06-14 NOTE — Telephone Encounter (Signed)
I called patient. I talked with the husband. The patient had some fatigue and slight confusion after coming back from her trip to [place]. She does have some diarrhea as well. I have recommended getting standard blood work, check an ammonia level as she is on Depakote. I'll order the blood work, I have asked my office to work her in sometime in the next several days.

## 2014-06-14 NOTE — Telephone Encounter (Signed)
See additional phone note from patient's spouse.

## 2014-06-15 ENCOUNTER — Other Ambulatory Visit (INDEPENDENT_AMBULATORY_CARE_PROVIDER_SITE_OTHER): Payer: Self-pay

## 2014-06-15 DIAGNOSIS — Z0289 Encounter for other administrative examinations: Secondary | ICD-10-CM

## 2014-06-15 DIAGNOSIS — Z5181 Encounter for therapeutic drug level monitoring: Secondary | ICD-10-CM

## 2014-06-16 ENCOUNTER — Encounter: Payer: Self-pay | Admitting: Neurology

## 2014-06-16 ENCOUNTER — Ambulatory Visit (INDEPENDENT_AMBULATORY_CARE_PROVIDER_SITE_OTHER): Payer: BC Managed Care – PPO | Admitting: Neurology

## 2014-06-16 ENCOUNTER — Encounter (INDEPENDENT_AMBULATORY_CARE_PROVIDER_SITE_OTHER): Payer: Self-pay

## 2014-06-16 VITALS — BP 129/72 | HR 73 | Wt 184.0 lb

## 2014-06-16 DIAGNOSIS — G43009 Migraine without aura, not intractable, without status migrainosus: Secondary | ICD-10-CM

## 2014-06-16 DIAGNOSIS — R251 Tremor, unspecified: Secondary | ICD-10-CM

## 2014-06-16 DIAGNOSIS — R259 Unspecified abnormal involuntary movements: Secondary | ICD-10-CM

## 2014-06-16 LAB — COMPREHENSIVE METABOLIC PANEL
ALT: 34 IU/L — ABNORMAL HIGH (ref 0–32)
AST: 45 IU/L — AB (ref 0–40)
Albumin/Globulin Ratio: 2.2 (ref 1.1–2.5)
Albumin: 4.3 g/dL (ref 3.6–4.8)
Alkaline Phosphatase: 64 IU/L (ref 39–117)
BUN / CREAT RATIO: 18 (ref 11–26)
BUN: 22 mg/dL (ref 8–27)
CALCIUM: 9.8 mg/dL (ref 8.7–10.3)
CHLORIDE: 99 mmol/L (ref 97–108)
CO2: 26 mmol/L (ref 18–29)
CREATININE: 1.24 mg/dL — AB (ref 0.57–1.00)
GFR calc Af Amer: 53 mL/min/{1.73_m2} — ABNORMAL LOW (ref >59)
GFR calc non Af Amer: 46 mL/min/{1.73_m2} — ABNORMAL LOW (ref >59)
GLOBULIN, TOTAL: 2 g/dL (ref 1.5–4.5)
Glucose: 124 mg/dL — ABNORMAL HIGH (ref 65–99)
Potassium: 4.2 mmol/L (ref 3.5–5.2)
SODIUM: 142 mmol/L (ref 134–144)
Total Bilirubin: 0.5 mg/dL (ref 0.0–1.2)
Total Protein: 6.3 g/dL (ref 6.0–8.5)

## 2014-06-16 LAB — CBC WITH DIFFERENTIAL
Basophils Absolute: 0 10*3/uL (ref 0.0–0.2)
Basos: 1 %
EOS: 1 %
Eosinophils Absolute: 0.1 10*3/uL (ref 0.0–0.4)
HCT: 44.8 % (ref 34.0–46.6)
Hemoglobin: 15 g/dL (ref 11.1–15.9)
IMMATURE GRANS (ABS): 0 10*3/uL (ref 0.0–0.1)
IMMATURE GRANULOCYTES: 0 %
Lymphocytes Absolute: 3.3 10*3/uL — ABNORMAL HIGH (ref 0.7–3.1)
Lymphs: 46 %
MCH: 32.5 pg (ref 26.6–33.0)
MCHC: 33.5 g/dL (ref 31.5–35.7)
MCV: 97 fL (ref 79–97)
MONOS ABS: 0.5 10*3/uL (ref 0.1–0.9)
Monocytes: 7 %
NEUTROS PCT: 45 %
Neutrophils Absolute: 3.2 10*3/uL (ref 1.4–7.0)
Platelets: 196 10*3/uL (ref 150–379)
RBC: 4.62 x10E6/uL (ref 3.77–5.28)
RDW: 14.6 % (ref 12.3–15.4)
WBC: 7.2 10*3/uL (ref 3.4–10.8)

## 2014-06-16 LAB — AMMONIA: AMMONIA: 61 ug/dL (ref 19–87)

## 2014-06-16 NOTE — Progress Notes (Signed)
Reason for visit: Headache  Kristin Gilmore is an 63 y.o. female  History of present illness:  Kristin Gilmore is a 63 year old right-handed white female with a history of chronic daily headache. The patient has had virtually daily headaches since May of 2015. The patient took a trip to [place] within the last several weeks, and she began having an increase in her headaches, and some associated diarrhea. The patient has noted some confusion, and decreased concentration abilities with an increase in the headache. The patient is taking more Nucynta than usual, but she continues to have her headaches. The patient is on Depakote taking 500 mg in the morning and 1000 mg in the evening. She was placed on Lipitor in mid July 2015. The patient has had blood work done recently, and it shows chronic renal insufficiency, and a mild elevation of the liver enzymes. The ammonia level is still pending. In the past, the patient indicates that she did received Botox therapy, and it worked well for her headaches. The patient did not continue the therapy because of financial concerns. MRI evaluation of the brain was ordered previously through this office, but the patient did not have a study done again due to financial concerns. The patient has very high deductible on her health insurance. The patient returns to this office for further evaluation. She has not noted any focal numbness or weakness of the face, arms, or legs. No balance changes have been noted.  Past Medical History  Diagnosis Date  . Migraine   . Depression   . Hypertension   . Dyslipidemia   . Tremor 04/13/2014  . Migraine without aura, without mention of intractable migraine without mention of status migrainosus 04/13/2014  . 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 Cholecystectomy    . Nasal sinus surgery      Family History  Problem Relation Age of Onset  . Cancer Father     stomach  cancer  . Migraines Mother     Social history:  reports that she has never smoked. She has never used smokeless tobacco. She reports that she does not drink alcohol or use illicit drugs.   No Known Allergies  Medications:  Current Outpatient Prescriptions on File Prior to Visit  Medication Sig Dispense Refill  . acetaminophen (TYLENOL) 500 MG tablet Take 500 mg by mouth every 6 (six) hours as needed.      Marland Kitchen aspirin 81 MG chewable tablet Chew 81 mg by mouth daily.      Marland Kitchen atorvastatin (LIPITOR) 20 MG tablet Take 20 mg by mouth daily.      . calcium-vitamin D (OSCAL WITH D) 500-200 MG-UNIT per tablet Take 1 tablet by mouth daily.      . Cholecalciferol (VITAMIN D3) 2000 UNITS TABS Take 1 tablet by mouth daily.      Marland Kitchen co-enzyme Q-10 50 MG capsule Take 50 mg by mouth daily.      Marland Kitchen dicyclomine (BENTYL) 20 MG tablet Take 20 mg by mouth every 6 (six) hours as needed.       Marland Kitchen glucosamine-chondroitin 500-400 MG tablet Take 1 tablet by mouth 3 (three) times daily.      . Lactobacillus (MORE-DOPHILUS ACIDOPHILUS PO) Take 1 capsule by mouth daily.      Marland Kitchen LORazepam (ATIVAN) 0.5 MG tablet Take 0.5 mg by mouth at bedtime as needed.      . Magnesium 250 MG TABS Take 1.5 tablets by  mouth daily.      . Melatonin 3 MG TABS Take 3 mg by mouth daily.      . metoprolol (LOPRESSOR) 50 MG tablet Take 50 mg by mouth 2 (two) times daily.      . QUEtiapine (SEROQUEL) 300 MG tablet Take 300 mg by mouth at bedtime.      . Riboflavin (VITAMIN B-2) 25 MG TABS Take 1 capsule by mouth daily.      . Tapentadol HCl (NUCYNTA) 100 MG TABS Take 1 tablet (100 mg total) by mouth every 6 (six) hours as needed.  50 tablet  0  . tiZANidine (ZANAFLEX) 2 MG tablet Take 1 tablet (2 mg total) by mouth 3 (three) times daily.  90 tablet  1  . topiramate (TOPAMAX) 25 MG tablet Take 4 tablets by mouth at bedtime.      . traMADol (ULTRAM) 50 MG tablet Take 50 mg by mouth daily.      . Turmeric 500 MG CAPS Take 2 capsules by mouth daily.       Marland Kitchen valproic acid (DEPAKENE) 250 MG capsule Two tablets in the morning and 4 tablets in the evening  180 capsule  1  . vitamin E 400 UNIT capsule Take 400 Units by mouth daily.       No current facility-administered medications on file prior to visit.    ROS:  Out of a complete 14 system review of symptoms, the patient complains only of the following symptoms, and all other reviewed systems are negative.  Fatigue, fever Ringing in the ears Light sensitivity Diarrhea Snoring Neck stiffness Dizziness, headache, speech difficulty, weakness Confusion, depression, anxiety  Blood pressure 129/72, pulse 73, weight 184 lb (83.462 kg).  Physical Exam  General: The patient is alert and cooperative at the time of the examination. The patient is moderately obese.  Skin: No significant peripheral edema is noted.   Neurologic Exam  Mental status: The Mini-Mental status examination done today shows a total score 22/30.  Cranial nerves: Facial symmetry is present. Speech is normal, no aphasia or dysarthria is noted. Extraocular movements are full. Visual fields are full.  Motor: The patient has good strength in all 4 extremities.  Sensory examination: Soft touch sensation is symmetric on the face, arms, and legs.  Coordination: The patient has good finger-nose-finger and heel-to-shin bilaterally. No asterixis is seen.  Gait and station: The patient has a normal gait. Tandem gait is normal. Romberg is negative. No drift is seen.  Reflexes: Deep tendon reflexes are symmetric.   Assessment/Plan:  One. Intractable migraine  2. Reports of confusion  3. Chronic renal insufficiency  4. Mild elevation in liver function tests  The patient is on Depakote and Lipitor, both medications have been added recently. The patient is off of her ketorolac, and she is not to take any more of this medication secondary to her renal function. The patient has gained benefit from Botox previously. The  patient will be set up for this in the near future. The patient may need to come off of the Depakote, I will call the patient when I get the full blood work results. The headache itself may be the etiology of her report of confusion. We may consider MRI evaluation of the brain in the near future. The patient otherwise will followup in about 2 or 3 months, sooner if we can get the Botox approval.  Jill Alexanders MD 06/16/2014 8:19 PM  [facility] [place],  [place] [address]  Phone [phone] Fax [phone]

## 2014-06-17 ENCOUNTER — Telehealth: Payer: Self-pay | Admitting: Neurology

## 2014-06-17 DIAGNOSIS — G8929 Other chronic pain: Secondary | ICD-10-CM

## 2014-06-17 DIAGNOSIS — F05 Delirium due to known physiological condition: Principal | ICD-10-CM

## 2014-06-17 DIAGNOSIS — R410 Disorientation, unspecified: Secondary | ICD-10-CM

## 2014-06-17 DIAGNOSIS — R519 Headache, unspecified: Secondary | ICD-10-CM

## 2014-06-17 DIAGNOSIS — R51 Headache: Secondary | ICD-10-CM

## 2014-06-17 NOTE — Telephone Encounter (Signed)
I called patient, talked with the husband. The liver enzymes are slightly elevated, the ammonia level is okay. I will taper her off of the Depakote going down to 500 mg twice daily for one week, then go to 500 mg at night for one week, and then stop the medication. I'll recheck the blood work in 6 weeks. The patient be set up for MRI evaluation of the brain secondary to the confusion. The husband indicates that she does not want the Botox at this time, but she may change her mind in the future. In the past, the Botox was helpful for her headaches. We could potentially do a block.

## 2014-06-23 ENCOUNTER — Telehealth: Payer: Self-pay | Admitting: Neurology

## 2014-06-23 NOTE — Telephone Encounter (Signed)
SEE PHONE NOTE 06/23/14.

## 2014-06-23 NOTE — Telephone Encounter (Signed)
Patient's husband calling to speak with Dr. Jannifer Franklin regarding patient's tiredness, wants to know if her liver function is causing that. Also wants to clarify about stopping patient's Depakote tomorrow. Please call and advise.

## 2014-06-23 NOTE — Telephone Encounter (Signed)
I called patient, talked with the husband. The liver enzyme elevations are very minor, is not clear whether this is the etiology for generalized fatigue. Fatigue is very nonspecific. The headaches are improving fortunately. The patient wants to hold off on getting the MRI of the brain. They will see their primary care doctor concerning the fatigue.

## 2014-07-04 ENCOUNTER — Telehealth: Payer: Self-pay | Admitting: Neurology

## 2014-07-04 NOTE — Telephone Encounter (Signed)
Patient states she experiencing Migraines everyday for about a week.  Please call anytime and may leave detailed message on voicemail.

## 2014-07-04 NOTE — Telephone Encounter (Signed)
WID- Dr. Jannifer Franklin is out of the office and patient has had a migraine for a week. Please advise.

## 2014-07-05 NOTE — Telephone Encounter (Signed)
Patient states that migraines have gotten worse, states that she wakes up every morning with a Headache, states that she is taking the Nuycenta and Tizanidine, states that she is also been having dizzy spells for the last few weeks, best contact number is [phone]

## 2014-07-05 NOTE — Telephone Encounter (Signed)
I called patient. The patient has had worsening of headache recently. The headaches are worse in the morning, but are present all day long. The patient is off of valproic acid, and the headaches seemed to have worsened. The patient is taking the tizanidine only if needed, I asked her to go on the medication 2 mg 3 times daily on a scheduled basis. We are working on trying to get Botox for this patient.

## 2014-07-11 ENCOUNTER — Telehealth: Payer: Self-pay | Admitting: *Deleted

## 2014-07-11 ENCOUNTER — Encounter: Payer: Self-pay | Admitting: Neurology

## 2014-07-11 NOTE — Telephone Encounter (Signed)
I will dictate a note regarding the use of Botox.

## 2014-07-11 NOTE — Telephone Encounter (Signed)
Patient calling about Botox injection, states that she was waiting on insurance approval, states that she is still waking up with headaches.

## 2014-07-11 NOTE — Telephone Encounter (Signed)
Spoke to [place] and she is still waiting for further information for approval of Botox.  Patient still waking with headaches.

## 2014-07-12 ENCOUNTER — Telehealth: Payer: Self-pay | Admitting: *Deleted

## 2014-07-12 NOTE — Telephone Encounter (Signed)
Patient calling in again about Botox approval, informed patient that approval process is still be worked on, also informed patient that it takes a little time to handle these, patient verbalized understanding and stated that she will be patient.

## 2014-07-15 ENCOUNTER — Telehealth: Payer: Self-pay | Admitting: Neurology

## 2014-07-15 NOTE — Telephone Encounter (Signed)
I called patient. The patient does indicate that she is having daily headaches. She is amenable to getting the Botox, we will get that set up.

## 2014-07-15 NOTE — Telephone Encounter (Signed)
Called pt to schedule botox injections she states her headaches have gotten much better and would like to speak with provider before going forward.

## 2014-07-18 NOTE — Telephone Encounter (Signed)
Called back again to schedule pt for injections and she is still indecisive. Will call back to schedule when ready. States she will have her husband call back later for more clarification.

## 2014-07-18 NOTE — Telephone Encounter (Signed)
The patient has indicated to me that she wishes to have the Botox done, it appears that she is still unsure.

## 2014-07-20 ENCOUNTER — Telehealth: Payer: Self-pay | Admitting: Neurology

## 2014-07-20 NOTE — Telephone Encounter (Signed)
I called patient. The patient believes that her headaches are getting better. She is only taking on average one of the Nucynta tablets daily. She may try to break the tablet in half, but she does not have to take the medication if she doesn't absolutely need to take it.

## 2014-07-20 NOTE — Telephone Encounter (Signed)
Patient calling to state that she would like to come off of her pain medication Nucynta and wants to know how to do that, states that her headaches are getting better. Please return call and advise.

## 2014-08-01 ENCOUNTER — Ambulatory Visit (INDEPENDENT_AMBULATORY_CARE_PROVIDER_SITE_OTHER): Payer: BC Managed Care – PPO | Admitting: Neurology

## 2014-08-01 DIAGNOSIS — G43719 Chronic migraine without aura, intractable, without status migrainosus: Secondary | ICD-10-CM

## 2014-08-01 DIAGNOSIS — G43011 Migraine without aura, intractable, with status migrainosus: Secondary | ICD-10-CM

## 2014-08-01 MED ORDER — TAPENTADOL HCL 100 MG PO TABS: 100.0000 mg | ORAL_TABLET | Freq: Four times a day (QID) | ORAL | Status: DC | PRN

## 2014-08-01 MED ORDER — TIZANIDINE HCL 2 MG PO TABS: 2.0000 mg | ORAL_TABLET | Freq: Four times a day (QID) | ORAL | Status: DC | PRN

## 2014-08-01 NOTE — Procedures (Signed)
     BOTOX PROCEDURE NOTE FOR MIGRAINE HEADACHE   HISTORY: Kristin Gilmore is a 63 year old patient with a history of intractable migraine headache, the patient continues to have daily headaches since May 2015. She comes into the office today for Botox injections for intractable migraine.   Description of procedure:  The patient was placed in a sitting position. The standard protocol was used for Botox as follows, with 5 units of Botox injected at each site:   -Procerus muscle, midline injection  -Corrugator muscle, bilateral injection  -Frontalis muscle, bilateral injection, with 2 sites each side, medial injection was performed in the upper one third of the frontalis muscle, in the region vertical from the medial inferior edge of the superior orbital rim. The lateral injection was again in the upper one third of the forehead vertically above the lateral limbus of the cornea, 1.5 cm lateral to the medial injection site.  -Temporalis muscle injection, 4 sites, bilaterally. The first injection was 3 cm above the tragus of the ear, second injection site was 1.5 cm to 3 cm up from the first injection site in line with the tragus of the ear. The third injection site was 1.5-3 cm forward between the first 2 injection sites. The fourth injection site was 1.5 cm posterior to the second injection site.  -Occipitalis muscle injection, 3 sites, bilaterally. The first injection was done one half way between the occipital protuberance and the tip of the mastoid process behind the ear. The second injection site was done lateral and superior to the first, 1 fingerbreadth from the first injection. The third injection site was 1 fingerbreadth superiorly and medially from the first injection site.  -Cervical paraspinal muscle injection, 2 sites, bilateral knee first injection site was 1 cm from the midline of the cervical spine, 3 cm inferior to the lower border of the occipital protuberance. The second  injection site was 1.5 cm superiorly and laterally to the first injection site.  -Trapezius muscle injection was performed at 3 sites, bilaterally. The first injection site was in the upper trapezius muscle halfway between the inflection point of the neck, and the acromion. The second injection site was one half way between the acromion and the first injection site. The third injection was done between the first injection site and the inflection point of the neck.   A 200 unit bottle of Botox was used, 155 units were injected, the rest of the Botox was wasted. The patient tolerated the procedure well, there were no complications of the above procedure.  Two 100 unit bottles of Botox were used, lot number [id number], expiration date March, 2018. Both bottles had the same lot number and expiration date.

## 2014-08-01 NOTE — Progress Notes (Signed)
Kristin Gilmore is a 63 year old patient with a history of chronic intractable migraine headache. The patient comes in today for a Botox injection of the head and neck for the migraine. The patient is getting chiropractic treatments with some benefit.  She was given refills on the Nucynta, and the tizanidine. Her headaches remain daily nature, occurring at least twice a day.  She will follow-up in 12 weeks for a second Botox injection.

## 2014-08-17 ENCOUNTER — Encounter: Payer: Self-pay | Admitting: Neurology

## 2014-08-23 ENCOUNTER — Encounter: Payer: Self-pay | Admitting: Neurology

## 2014-08-29 ENCOUNTER — Telehealth: Payer: Self-pay | Admitting: Neurology

## 2014-08-31 ENCOUNTER — Telehealth: Payer: Self-pay | Admitting: Neurology

## 2014-08-31 NOTE — Telephone Encounter (Signed)
I called the patient. The contrast was not ordered as the patient has chronic renal insufficiency, GFR is 46. I discussed this with the husband.

## 2014-08-31 NOTE — Telephone Encounter (Signed)
Patient's spouse questioning why MRI scheduled on 12/3 was ordered with out Contrast?  Pleases call and advise.

## 2014-09-01 ENCOUNTER — Ambulatory Visit (INDEPENDENT_AMBULATORY_CARE_PROVIDER_SITE_OTHER): Payer: BC Managed Care – PPO

## 2014-09-01 DIAGNOSIS — R519 Headache, unspecified: Secondary | ICD-10-CM

## 2014-09-01 DIAGNOSIS — R410 Disorientation, unspecified: Secondary | ICD-10-CM

## 2014-09-01 DIAGNOSIS — R51 Headache: Secondary | ICD-10-CM

## 2014-09-01 DIAGNOSIS — F05 Delirium due to known physiological condition: Secondary | ICD-10-CM

## 2014-09-01 DIAGNOSIS — G8929 Other chronic pain: Secondary | ICD-10-CM

## 2014-09-05 ENCOUNTER — Telehealth: Payer: Self-pay | Admitting: Neurology

## 2014-09-05 MED ORDER — TAPENTADOL HCL 100 MG PO TABS: 100.0000 mg | ORAL_TABLET | Freq: Four times a day (QID) | ORAL | Status: DC | PRN

## 2014-09-05 MED ORDER — TOPIRAMATE 25 MG PO TABS: 75.0000 mg | ORAL_TABLET | Freq: Every day | ORAL | Status: DC

## 2014-09-05 NOTE — Telephone Encounter (Signed)
  I called the patient. MRI of the brain shows minimal white matter nonspecific lesions. This could be consistent with the diagnosis of migraine headache. I discussed this with the patient.  MRI brain 09/05/2014:   IMPRESSION:  Mildly abnormal MRI brain (without) demonstrating: 1. Minimal periventricular and subcortical and juxtacortical foci of non-specific T2 hyperintensities. These findings are non-specific and considerations include autoimmune, inflammatory, post-infectious, microvascular ischemic or migraine associated etiologies.  2. No acute findings.

## 2014-09-06 NOTE — Telephone Encounter (Signed)
I called the patient to let them know their Rx for Nucynta was ready for pickup. Patient was instructed to bring Photo ID.

## 2014-09-08 ENCOUNTER — Telehealth: Payer: Self-pay | Admitting: Neurology

## 2014-09-08 MED ORDER — HYDROCODONE-ACETAMINOPHEN 5-325 MG PO TABS: 1.0000 | ORAL_TABLET | Freq: Four times a day (QID) | ORAL | Status: DC | PRN

## 2014-09-08 MED ORDER — TOPIRAMATE 50 MG PO TABS: 150.0000 mg | ORAL_TABLET | Freq: Every day | ORAL | Status: DC

## 2014-09-08 NOTE — Telephone Encounter (Signed)
I called the patient. The last 2 weeks, the headaches have gotten worse, starting in the morning, lasting all day long. The patient indicates that the Nucynta is not working. I will try hydrocodone. We will increase the Topamax to a 150 mg dosing daily.

## 2014-09-08 NOTE — Telephone Encounter (Signed)
Pt is getting a migraine everyday she is taking the medication Dr. Jannifer Franklin has given her, but it is not helping the pain.  Needs to know what she should do. Please call and advise.

## 2014-09-09 NOTE — Telephone Encounter (Signed)
I called the patient to let them know their Rx for Hydrocodone was ready for pickup. Patient was instructed to bring Photo ID. 

## 2014-09-27 ENCOUNTER — Encounter: Payer: Self-pay | Admitting: Adult Health

## 2014-09-27 ENCOUNTER — Ambulatory Visit (INDEPENDENT_AMBULATORY_CARE_PROVIDER_SITE_OTHER): Payer: BC Managed Care – PPO | Admitting: Adult Health

## 2014-09-27 VITALS — BP 154/91 | HR 81 | Ht 67.5 in | Wt 187.4 lb

## 2014-09-27 DIAGNOSIS — G43011 Migraine without aura, intractable, with status migrainosus: Secondary | ICD-10-CM

## 2014-09-27 MED ORDER — TIZANIDINE HCL 4 MG PO TABS: 4.0000 mg | ORAL_TABLET | Freq: Two times a day (BID) | ORAL | Status: DC

## 2014-09-27 NOTE — Progress Notes (Signed)
I have read the note, and I agree with the clinical assessment and plan.  Kristin Gilmore   

## 2014-09-27 NOTE — Patient Instructions (Signed)
I will increase Tizanidine to 4 mg twice a day. If this is not beneficial we can further increase the dose.  I would schedule your 2nd round of botox for February 2nd or after. If your symptoms worsen or you develop new symptoms please let us know.

## 2014-09-27 NOTE — Progress Notes (Signed)
PATIENT: Kristin Gilmore DOB: 1950-12-24  REASON FOR VISIT: follow up HISTORY FROM: patient  HISTORY OF PRESENT ILLNESS: Kristin Gilmore is a 63 year old female with a history of migraines. She returns today for follow-up. She is currently taking Topamax 150 mg daily as well as tizanidine 2 mg 3 times a day. She was also given a prescription for hydrocodone to use for severe pain. She reports that her headaches are daily. Confirms photophobia or phonophobia. Denies Nausea and vomiting.  She does have some dizziness. She states that her migraines are located int he frontal and occipital region. She had an MRI of the neck and has an appointment with an orthopaedic surgeon. The patient has had Botox in the past and has been set up for additional Botox injections. The last injection was November 2nd and she reports that she did not receive any benefit.   HISTORY 06/16/14 (WILLIS): Kristin Gilmore is a 63 year old right-handed white female with a history of chronic daily headache. The patient has had virtually daily headaches since May of 2015. The patient took a trip to [place] within the last several weeks, and she began having an increase in her headaches, and some associated diarrhea. The patient has noted some confusion, and decreased concentration abilities with an increase in the headache. The patient is taking more Nucynta than usual, but she continues to have her headaches. The patient is on Depakote taking 500 mg in the morning and 1000 mg in the evening. She was placed on Lipitor in mid July 2015. The patient has had blood work done recently, and it shows chronic renal insufficiency, and a mild elevation of the liver enzymes. The ammonia level is still pending. In the past, the patient indicates that she did received Botox therapy, and it worked well for her headaches. The patient did not continue the therapy because of financial concerns. MRI evaluation of the brain was ordered previously  through this office, but the patient did not have a study done again due to financial concerns. The patient has very high deductible on her health insurance. The patient returns to this office for further evaluation. She has not noted any focal numbness or weakness of the face, arms, or legs. No balance changes have been noted  REVIEW OF SYSTEMS: Out of a complete 14 system review of symptoms, the patient complains only of the following symptoms, and all other reviewed systems are negative.  Activity change, fatigue, back pain, neck pain, depression, dizziness, headache  ALLERGIES: No Known Allergies  HOME MEDICATIONS: Outpatient Prescriptions Prior to Visit  Medication Sig Dispense Refill  . acetaminophen (TYLENOL) 500 MG tablet Take 500 mg by mouth every 6 (six) hours as needed.    Marland Kitchen aspirin 81 MG chewable tablet Chew 81 mg by mouth daily.    Marland Kitchen atorvastatin (LIPITOR) 20 MG tablet Take 20 mg by mouth daily.    . calcium-vitamin D (OSCAL WITH D) 500-200 MG-UNIT per tablet Take 1 tablet by mouth daily.    . Cholecalciferol (VITAMIN D3) 2000 UNITS TABS Take 1 tablet by mouth daily.    Marland Kitchen co-enzyme Q-10 50 MG capsule Take 50 mg by mouth daily.    Marland Kitchen dicyclomine (BENTYL) 20 MG tablet Take 20 mg by mouth every 6 (six) hours as needed.     Marland Kitchen glucosamine-chondroitin 500-400 MG tablet Take 1 tablet by mouth 3 (three) times daily.    Marland Kitchen HYDROcodone-acetaminophen (NORCO/VICODIN) 5-325 MG per tablet Take 1 tablet by mouth every 6 (six) hours  as needed for moderate pain. 60 tablet 0  . Lactobacillus (MORE-DOPHILUS ACIDOPHILUS PO) Take 1 capsule by mouth daily.    Marland Kitchen LORazepam (ATIVAN) 0.5 MG tablet Take 0.5 mg by mouth at bedtime as needed.    . Magnesium 250 MG TABS Take 1.5 tablets by mouth daily.    . Melatonin 3 MG TABS Take 3 mg by mouth daily.    . metoprolol (LOPRESSOR) 50 MG tablet Take 50 mg by mouth 2 (two) times daily.    . QUEtiapine (SEROQUEL) 300 MG tablet Take 300 mg by mouth at bedtime.      . Riboflavin (VITAMIN B-2) 25 MG TABS Take 1 capsule by mouth daily.    Marland Kitchen tiZANidine (ZANAFLEX) 2 MG tablet Take 1 tablet (2 mg total) by mouth every 6 (six) hours as needed for muscle spasms. 90 tablet 3  . topiramate (TOPAMAX) 50 MG tablet Take 3 tablets (150 mg total) by mouth at bedtime. 90 tablet 3  . Turmeric 500 MG CAPS Take 2 capsules by mouth daily.    Marland Kitchen valproic acid (DEPAKENE) 250 MG capsule Two tablets in the morning and 4 tablets in the evening 180 capsule 1  . vitamin E 400 UNIT capsule Take 400 Units by mouth daily.     No facility-administered medications prior to visit.    PAST MEDICAL HISTORY: Past Medical History  Diagnosis Date  . Migraine   . Depression   . Hypertension   . Dyslipidemia   . Tremor 04/13/2014  . Migraine without aura, without mention of intractable migraine without mention of status migrainosus 04/13/2014  . Pituitary microadenoma 04/13/2014    PAST SURGICAL HISTORY: Past Surgical History  Procedure Laterality Date  . Cholecystectomy    . Nasal sinus surgery      FAMILY HISTORY: Family History  Problem Relation Age of Onset  . Cancer Father     stomach cancer  . Migraines Mother     SOCIAL HISTORY: History   Social History  . Marital Status: Married    Spouse Name: N/A    Number of Children: 1  . Years of Education: college   Occupational History  . Retired    Social History Main Topics  . Smoking status: Never Smoker   . Smokeless tobacco: Never Used  . Alcohol Use: No  . Drug Use: No  . Sexual Activity: Not on file   Other Topics Concern  . Not on file   Social History Narrative      PHYSICAL EXAM  Filed Vitals:   09/27/14 1125  BP: 154/91  Pulse: 81  Height: 5' 7.5" (1.715 m)  Weight: 187 lb 6.4 oz (85.004 kg)   Body mass index is 28.9 kg/(m^2).  Generalized: Well developed, in no acute distress   Neurological examination  Mentation: Alert oriented to time, place, history taking. Follows all commands  speech and language fluent Cranial nerve II-XII: Pupils were equal round reactive to light. Extraocular movements were full, visual field were full on confrontational test. Facial sensation and strength were normal. Uvula tongue midline. Head turning and shoulder shrug  were normal and symmetric. Motor: The motor testing reveals 5 over 5 strength of all 4 extremities. Good symmetric motor tone is noted throughout.  Sensory: Sensory testing is intact to soft touch on all 4 extremities. No evidence of extinction is noted.  Coordination: Cerebellar testing reveals good finger-nose-finger and heel-to-shin bilaterally.  Gait and station: Gait is normal. Tandem gait is normal. Romberg is negative. No  drift is seen.  Reflexes: Deep tendon reflexes are symmetric and normal bilaterally.    DIAGNOSTIC DATA (LABS, IMAGING, TESTING) - I reviewed patient records, labs, notes, testing and imaging myself where available.  Lab Results  Component Value Date   WBC 7.2 06/15/2014   HGB 15.0 06/15/2014   HCT 44.8 06/15/2014   MCV 97 06/15/2014   PLT 196 06/15/2014      Component Value Date/Time   NA 142 06/15/2014 1319   NA 142 04/07/2008 0630   K 4.2 06/15/2014 1319   CL 99 06/15/2014 1319   CO2 26 06/15/2014 1319   GLUCOSE 124* 06/15/2014 1319   GLUCOSE 124* 04/07/2008 0630   BUN 22 06/15/2014 1319   BUN 16 04/07/2008 0630   CREATININE 1.24* 06/15/2014 1319   CALCIUM 9.8 06/15/2014 1319   PROT 6.3 06/15/2014 1319   PROT 6.2 04/06/2008 2205   ALBUMIN 3.7 04/06/2008 2205   AST 45* 06/15/2014 1319   ALT 34* 06/15/2014 1319   ALKPHOS 64 06/15/2014 1319   BILITOT 0.5 06/15/2014 1319   GFRNONAA 46* 06/15/2014 1319   GFRAA 53* 06/15/2014 1319   Lab Results  Component Value Date   CHOL  04/07/2008    166        ATP III CLASSIFICATION:  <200     mg/dL   Desirable  200-239  mg/dL   Borderline High  >=240    mg/dL   High   HDL 64 04/07/2008   LDLCALC  04/07/2008    85        Total  Cholesterol/HDL:CHD Risk Coronary Heart Disease Risk Table                     Men   Women  1/2 Average Risk   3.4   3.3   TRIG 86 04/07/2008   CHOLHDL 2.6 04/07/2008   No results found for: HGBA1C Lab Results  Component Value Date   VITAMINB12 797 05/27/2014   Lab Results  Component Value Date   TSH 2.540 05/27/2014      ASSESSMENT AND PLAN 62 y.o. year old female  has a past medical history of Migraine; Depression; Hypertension; Dyslipidemia; Tremor (04/13/2014); Migraine without aura, without mention of intractable migraine without mention of status migrainosus (04/13/2014); and Pituitary microadenoma (04/13/2014). here with:  1. Migraines  Patient has continued to have daily headaches. She is currently taking Topamax 150 mg daily and tizanidine 2 mg twice a day. I will increase the tizanidine to 4 mg twice a day. If this is not beneficial we will increase to 3 times a day. The patient had her first Botox injection in November but did not see the benefit. I explained that she should follow through with a second round of injections before she rules out Botox. Patient verbalized understanding. She will continue using the hydrocodone for severe pain. If her symptoms worsen or she develops new symptoms she should let us know. Otherwise she will follow up in 6 months.     Ward Givens, MSN, NP-C 09/27/2014, 11:34 AM [facility] [address] [place], [place] [address] [phone]  Note: This document was prepared with digital dictation and possible smart phrase technology. Any transcriptional errors that result from this process are unintentional.

## 2014-10-07 ENCOUNTER — Other Ambulatory Visit: Payer: Self-pay | Admitting: Adult Health

## 2014-10-07 MED ORDER — HYDROCODONE-ACETAMINOPHEN 5-325 MG PO TABS: 1.0000 | ORAL_TABLET | Freq: Four times a day (QID) | ORAL | Status: DC | PRN

## 2014-10-07 NOTE — Telephone Encounter (Signed)
Patient requesting Rx refill for Rx HYDROcodone-acetaminophen (NORCO/VICODIN) 5-325 MG per tablet.  Patient has only 4 tablets left.  Please call when ready for pick up.

## 2014-10-07 NOTE — Telephone Encounter (Signed)
Request entered, forwarded to provider for approval.  

## 2014-10-10 NOTE — Telephone Encounter (Signed)
I called the patient to let them know their Rx was ready for pickup. Patient was instructed to bring Photo ID.  [name] Dobbins, Patients husband, DOB [birth year], will be picking up the patients prescription.

## 2014-11-02 ENCOUNTER — Encounter: Payer: Self-pay | Admitting: Neurology

## 2014-11-02 ENCOUNTER — Ambulatory Visit: Payer: Self-pay | Admitting: Neurology

## 2014-11-02 ENCOUNTER — Ambulatory Visit (INDEPENDENT_AMBULATORY_CARE_PROVIDER_SITE_OTHER): Payer: BLUE CROSS/BLUE SHIELD | Admitting: Neurology

## 2014-11-02 VITALS — BP 165/89 | HR 88

## 2014-11-02 DIAGNOSIS — G43719 Chronic migraine without aura, intractable, without status migrainosus: Secondary | ICD-10-CM

## 2014-11-02 MED ORDER — HYDROCODONE-ACETAMINOPHEN 5-325 MG PO TABS: 1.0000 | ORAL_TABLET | Freq: Four times a day (QID) | ORAL | Status: DC | PRN

## 2014-11-02 MED ORDER — ONABOTULINUMTOXINA 100 UNITS IJ SOLR
200.0000 [IU] | Freq: Once | INTRAMUSCULAR | Status: AC
  Administered 2014-11-02: 200 [IU] via INTRAMUSCULAR

## 2014-11-02 MED ORDER — SUMATRIPTAN SUCCINATE 100 MG PO TABS
100.0000 mg | ORAL_TABLET | Freq: Two times a day (BID) | ORAL | Status: DC | PRN
Start: 2014-11-02 — End: 2015-02-09

## 2014-11-02 NOTE — Progress Notes (Signed)
Reason for visit: Headache  Kristin Gilmore is an 64 y.o. female  History of present illness:  Ms. Kristin Gilmore is a 64 year old right-handed white female with a history of intractable migraine headache. The patient returns for Botox injections today. She last got injections 4 months ago, she indicates that there was no benefit. The headaches continue to be bifrontal, and are every day, lasting all day long. She has also had a lot of problems with low back pain, and she is seeing Dr. Tonita Cong for this. The patient is getting physical therapy for her back, she is using a TENS unit. She also has some neck discomfort. The patient denies that her headache pain comes up from the back of the head, however. She returns for further evaluation.  Past Medical History  Diagnosis Date  . Migraine   . Depression   . Hypertension   . Dyslipidemia   . Tremor 04/13/2014  . Migraine without aura, without mention of intractable migraine without mention of status migrainosus 04/13/2014  . 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 Cholecystectomy    . Nasal sinus surgery      Family History  Problem Relation Age of Onset  . Cancer Father     stomach cancer  . Migraines Mother     Social history:  reports that she has never smoked. She has never used smokeless tobacco. She reports that she does not drink alcohol or use illicit drugs.   No Known Allergies  Medications:  Current Outpatient Prescriptions on File Prior to Visit  Medication Sig Dispense Refill  . acetaminophen (TYLENOL) 500 MG tablet Take 500 mg by mouth every 6 (six) hours as needed.    Marland Kitchen aspirin 81 MG chewable tablet Chew 81 mg by mouth daily.    Marland Kitchen atorvastatin (LIPITOR) 20 MG tablet Take 20 mg by mouth daily.    . calcium-vitamin D (OSCAL WITH D) 500-200 MG-UNIT per tablet Take 1 tablet by mouth daily.    . Cholecalciferol (VITAMIN D3) 2000 UNITS TABS Take 1 tablet by mouth daily.     Marland Kitchen co-enzyme Q-10 50 MG capsule Take 50 mg by mouth daily.    Marland Kitchen dicyclomine (BENTYL) 20 MG tablet Take 20 mg by mouth every 6 (six) hours as needed.     Marland Kitchen glucosamine-chondroitin 500-400 MG tablet Take 1 tablet by mouth 3 (three) times daily.    . Lactobacillus (MORE-DOPHILUS ACIDOPHILUS PO) Take 1 capsule by mouth daily.    Marland Kitchen LORazepam (ATIVAN) 0.5 MG tablet Take 0.5 mg by mouth at bedtime as needed.    . Magnesium 250 MG TABS Take 1.5 tablets by mouth daily.    . Melatonin 3 MG TABS Take 3 mg by mouth daily.    . metoprolol (LOPRESSOR) 50 MG tablet Take 50 mg by mouth 2 (two) times daily.    . pseudoephedrine-acetaminophen (TYLENOL SINUS) 30-500 MG TABS Take 1 tablet by mouth every 4 (four) hours as needed.    Marland Kitchen QUEtiapine (SEROQUEL) 300 MG tablet Take 300 mg by mouth at bedtime.    . Riboflavin (VITAMIN B-2) 25 MG TABS Take 1 capsule by mouth daily.    Marland Kitchen tiZANidine (ZANAFLEX) 4 MG tablet Take 1 tablet (4 mg total) by mouth 2 (two) times daily. 30 tablet 3  . topiramate (TOPAMAX) 50 MG tablet Take 3 tablets (150 mg total) by mouth at bedtime. 90 tablet 3  . Turmeric 500 MG CAPS Take 2  capsules by mouth daily.    Marland Kitchen valproic acid (DEPAKENE) 250 MG capsule Two tablets in the morning and 4 tablets in the evening 180 capsule 1  . vitamin E 400 UNIT capsule Take 400 Units by mouth daily.     No current facility-administered medications on file prior to visit.    ROS:  Out of a complete 14 system review of symptoms, the patient complains only of the following symptoms, and all other reviewed systems are negative.  Headache Low back pain  Blood pressure 165/89, pulse 88, height 0' (0 m), weight 0 lb (0 kg).  Physical Exam  General: The patient is alert and cooperative at the time of the examination.  Skin: No significant peripheral edema is noted.   Neurologic Exam  Mental status: The patient is oriented x 3.  Cranial nerves: Facial symmetry is present. Speech is normal, no  aphasia or dysarthria is noted. Extraocular movements are full. Visual fields are full.  Motor: The patient has good strength in all 4 extremities.  Sensory examination: Soft touch sensation is symmetric on the face, arms, and legs.  Coordination: The patient has good finger-nose-finger and heel-to-shin bilaterally.  Gait and station: The patient has a normal gait. Romberg is negative. No drift is seen.  Reflexes: Deep tendon reflexes are symmetric.   Assessment/Plan:  1. Intractable migraine headache  The patient comes in today for Botox injections, please refer to procedure note. The patient continues to have ongoing daily pain. The hydrocodone dosing will be increased to 120 tablets a month, and she will be given a trial on Imitrex to see if this is helpful. A prescription was called in for the 100 mg tablets. She will follow-up in 4 months for her next Botox injection. If this injection is not helpful, I see no indication to continue this treatment.  Jill Alexanders MD 11/03/2014 9:36 PM  [facility] [address] [place], [place] [address]  Phone [phone] Fax [phone]

## 2014-11-02 NOTE — Patient Instructions (Signed)

## 2014-11-02 NOTE — Procedures (Signed)
     BOTOX PROCEDURE NOTE FOR MIGRAINE HEADACHE   HISTORY: Kristin Gilmore is a 63 year old patient with a history of intractable migraine headache. She is being treated with a series of Botox injections to improve her headaches. She returns for a Botox injection. She continues to have daily headaches, headaches are lasting all day long. Headaches are bifrontal in nature.   Description of procedure:  The patient was placed in a sitting position. The standard protocol was used for Botox as follows, with 5 units of Botox injected at each site:   -Procerus muscle, midline injection  -Corrugator muscle, bilateral injection  -Frontalis muscle, bilateral injection, with 2 sites each side, medial injection was performed in the upper one third of the frontalis muscle, in the region vertical from the medial inferior edge of the superior orbital rim. The lateral injection was again in the upper one third of the forehead vertically above the lateral limbus of the cornea, 1.5 cm lateral to the medial injection site.  -Temporalis muscle injection, 4 sites, bilaterally. The first injection was 3 cm above the tragus of the ear, second injection site was 1.5 cm to 3 cm up from the first injection site in line with the tragus of the ear. The third injection site was 1.5-3 cm forward between the first 2 injection sites. The fourth injection site was 1.5 cm posterior to the second injection site.  -Occipitalis muscle injection, 3 sites, bilaterally. The first injection was done one half way between the occipital protuberance and the tip of the mastoid process behind the ear. The second injection site was done lateral and superior to the first, 1 fingerbreadth from the first injection. The third injection site was 1 fingerbreadth superiorly and medially from the first injection site.  -Cervical paraspinal muscle injection, 2 sites, bilateral knee first injection site was 1 cm from the midline of the cervical  spine, 3 cm inferior to the lower border of the occipital protuberance. The second injection site was 1.5 cm superiorly and laterally to the first injection site.  -Trapezius muscle injection was performed at 3 sites, bilaterally. The first injection site was in the upper trapezius muscle halfway between the inflection point of the neck, and the acromion. The second injection site was one half way between the acromion and the first injection site. The third injection was done between the first injection site and the inflection point of the neck.   A 200 unit bottle of Botox was used, 155 units were injected, the rest of the Botox was wasted. The patient tolerated the procedure well, there were no complications of the above procedure.  Botox NDC 7841-2820-81 Lot number [id number] Expiration date October 2018

## 2014-11-17 ENCOUNTER — Ambulatory Visit: Payer: Self-pay | Admitting: Neurology

## 2014-11-21 ENCOUNTER — Telehealth: Payer: Self-pay | Admitting: Neurology

## 2014-11-21 NOTE — Telephone Encounter (Signed)
Pt's husband is calling stating pt is going to run out of SUMAtriptan (IMITREX) 100 MG tablet before it's time to refill.  He wants to know if the Rx can be written for more than 10 pills per month.  Otherwise they have to pay out of pocket.  The Rx she has now can't be refilled until 2/26 and she is out.  Please call and advise.

## 2014-11-21 NOTE — Telephone Encounter (Signed)
I called back.  Got no answer.

## 2014-11-21 NOTE — Telephone Encounter (Signed)
Called again. Got no answer.   

## 2014-11-22 ENCOUNTER — Telehealth: Payer: Self-pay

## 2014-11-22 NOTE — Telephone Encounter (Signed)
I spoke with the patient who said she has been waking up with headaches for about two weeks.  She has been taking Imitrex, but still has daily headaches.  Says she has tried several different medications and nothing has seemed to help.  Indicates she had her second Botox treatment this month, but does not feel it has been effective.  States her friend gets an "IV drip" for migraines, and she is interested in trying this, although she is unsure what the medication is called.  Please advise.  Thank you.

## 2014-11-22 NOTE — Telephone Encounter (Signed)
I called the patient. She continues to have severe daily headaches. I have recommended the [place] therapy. We will try to get her in later this week. The patient also may be interested in a referral to the [facility]. She suggested as well the Depacon injections, this is better for an acute headache not chronic daily migraine.

## 2014-11-23 NOTE — Telephone Encounter (Signed)
Spoke to patient and she is scheduled tomorrow 11-24-14 at 1600 for sphenocath injection.

## 2014-11-24 ENCOUNTER — Ambulatory Visit (INDEPENDENT_AMBULATORY_CARE_PROVIDER_SITE_OTHER): Payer: BLUE CROSS/BLUE SHIELD | Admitting: Neurology

## 2014-11-24 ENCOUNTER — Encounter: Payer: Self-pay | Admitting: Neurology

## 2014-11-24 VITALS — BP 170/94 | HR 85

## 2014-11-24 DIAGNOSIS — G43719 Chronic migraine without aura, intractable, without status migrainosus: Secondary | ICD-10-CM

## 2014-11-24 NOTE — Procedures (Signed)
    [place] PROCEDURE NOTE  History: Kristin Gilmore is a 65 year old patient with a history of chronic daily intractable migraine headache. The patient has been on Botox injections without significant benefit. She is on Topamax taking 150 mg at night without benefit. She comes in today for a Sphenocath treatment for the migraine. The headaches have worsened significantly over the last 4-5 weeks.  Procedure: The patient was placed in the supine position. A temperature strip was added to the cheek area after the area was cleaned with alcohol. The Sphenocath was lubricated with gel, and placed in the right naris. The catheter was inserted above the middle turbinate to the posterior nasal cavity, and then withdrawn 1 cm. The catheter was deployed and rotated approximately 20 towards the nose. 2-1/2 mL of 2% lidocaine was deployed. The patient was asked to swallow during the injection. The patient demonstrated erythema of the sclera of the eye on this side, and an increase in the cheek temperature was noted from 92 F to 94 F.  This process was repeated on the left side, with similar results. The increase in cheek temperature was documented from 80 F to 43 F.  The patient tolerated the procedure well. No complications of the procedure were noted. The patient was kept in the supine position for 8 minutes following the procedure. She was given small sips of water after sitting up following the procedure.  Lidocaine 2% NDC 19622-297-98  Expiration date: 12/16 Lot number: [id number]  [name]

## 2014-11-24 NOTE — Progress Notes (Signed)
Please refer to Sphenocath procedure note.

## 2014-11-25 ENCOUNTER — Telehealth: Payer: Self-pay | Admitting: Neurology

## 2014-11-25 NOTE — Telephone Encounter (Signed)
Spoke to patient and she is scheduled 11-30-14 at 4pm for sphenocath.  Patient also relayed that she got nauseated after the first injection, and wants to know how many more she needs to have.  Also asked about Depacon infusion.

## 2014-11-25 NOTE — Telephone Encounter (Signed)
I called patient, talk with her husband. The patient is not sure she was to continue the [place] treatments. She had some nausea after the procedure. The patient ultimately will need to make the decision on whether she wants to continue. She does have an appointment at [facility] for another pain regarding treatment of her headaches. If the patient wishes to have ongoing treatments, she is to call our office.

## 2014-11-25 NOTE — Telephone Encounter (Signed)
-----   Message from Kathrynn Ducking, MD sent at 11/24/2014  6:11 PM EST ----- Please have the patient set up next Wednesday or Thursday for a Sphenocath treatment, okay to have patient come in late in the day.

## 2014-11-28 ENCOUNTER — Telehealth: Payer: Self-pay | Admitting: Neurology

## 2014-11-28 ENCOUNTER — Ambulatory Visit: Payer: Self-pay | Admitting: Neurology

## 2014-11-28 ENCOUNTER — Other Ambulatory Visit: Payer: Self-pay | Admitting: Adult Health

## 2014-11-28 NOTE — Telephone Encounter (Signed)
I called the patient. She indicated that after the last Sphenocath procedure, her headache worsened, she had nausea. We will give her Phenergan on the next treatment. If she gets no benefit, she may decide not to do any further events.

## 2014-11-28 NOTE — Telephone Encounter (Signed)
Patient is calling and would like to ask questions about the sphenocath injection procedure she is having Wednesday.  Please call.

## 2014-11-30 ENCOUNTER — Ambulatory Visit (INDEPENDENT_AMBULATORY_CARE_PROVIDER_SITE_OTHER): Payer: BLUE CROSS/BLUE SHIELD | Admitting: Neurology

## 2014-11-30 ENCOUNTER — Encounter: Payer: Self-pay | Admitting: Neurology

## 2014-11-30 VITALS — BP 187/89 | HR 79

## 2014-11-30 DIAGNOSIS — G43001 Migraine without aura, not intractable, with status migrainosus: Secondary | ICD-10-CM | POA: Diagnosis not present

## 2014-11-30 NOTE — Procedures (Signed)
    [place] PROCEDURE NOTE  History: Kristin Gilmore is a 64 year old patient with a  history of chronic daily intractable migraine headache. The  patient has been on Botox injections without significant benefit.  She is on Topamax taking 150 mg at night without benefit. She  comes in today for a Sphenocath treatment for the migraine. The  headaches have worsened significantly over the last 4-5 weeks.  Procedure: The patient was placed in the supine position. A temperature strip was added to the cheek area after the area was cleaned with alcohol. The Sphenocath was lubricated with gel, and placed in the right naris. The catheter was inserted above the middle turbinate to the posterior nasal cavity, and then withdrawn 1 cm. The catheter was deployed and rotated approximately 20 towards the nose. 2-1/2 mL of 2% lidocaine was deployed. The patient was asked to swallow during the injection. The patient demonstrated erythema of the sclera of the eye on this side, and an increase in the cheek temperature was noted from 92 F to 94 F.  This process was repeated on the left side, with similar results. The increase in cheek temperature was documented from 40 F to 83 F.  The patient tolerated the procedure well. No complications of the procedure were noted. The patient was kept in the supine position for 8 minutes following the procedure. She was given small sips of water after sitting up following the procedure.  Lidocaine 2% NDC 83291-916-60  Expiration date: 12/16 Lot number: [id number]  [name]

## 2014-11-30 NOTE — Progress Notes (Signed)
Please refer to Sphenocath procedure note.

## 2014-12-05 ENCOUNTER — Telehealth: Payer: Self-pay | Admitting: Neurology

## 2014-12-05 NOTE — Telephone Encounter (Signed)
Pt's husband is calling stating he has questions regarding QUEtiapine (SEROQUEL) 300 MG tablet.  He wants to know if this could be causing her headaches.  Please call and advise.

## 2014-12-05 NOTE — Telephone Encounter (Signed)
I called the patient. The patient looks back in her records and indicates that the headaches began around the time she started Seroquel. Dr. [name] is managing this medication. They will try to back off on the medication slowly to see if the headaches improved. They will not do any further Sphenocath injections, this did not help.

## 2014-12-16 DIAGNOSIS — Z0289 Encounter for other administrative examinations: Secondary | ICD-10-CM

## 2014-12-29 ENCOUNTER — Telehealth: Payer: Self-pay | Admitting: *Deleted

## 2014-12-29 MED ORDER — IMIPRAMINE HCL 25 MG PO TABS: ORAL_TABLET | ORAL | Status: DC

## 2014-12-29 NOTE — Telephone Encounter (Signed)
Patient calling states that her headache is not getting any better and she would like to talk to Dr Jannifer Franklin about starting Imipramine, please call patient.

## 2014-12-29 NOTE — Telephone Encounter (Signed)
I called patient. She continues to have ongoing headaches. The Seroquel dose has been cut back, but she continues to have her usual headaches. She is now on 100 mg of Seroquel at night. She once to start the imipramine to help her sleep and for the headache. We will try 25 mg going to 50 mg over 2 weeks. I will call in a prescription. The patient is being followed through a pain center in [place], the patient has undergone nerve blocks without much benefit so far.

## 2015-01-03 ENCOUNTER — Telehealth: Payer: Self-pay | Admitting: Neurology

## 2015-01-03 MED ORDER — OXYCODONE-ACETAMINOPHEN 5-325 MG PO TABS: 1.0000 | ORAL_TABLET | Freq: Four times a day (QID) | ORAL | Status: DC | PRN

## 2015-01-03 NOTE — Telephone Encounter (Signed)
Patient's spouse stated patient can't tolerate Rx HYDROcodone-acetaminophen (NORCO/VICODIN) 5-325 MG per tablet due to GI issues.  Questioning if Rx could be replaced with PERCOCET and also a Rx for MOVANTIK, blocker for Opoids.  Please call and advise.

## 2015-01-03 NOTE — Telephone Encounter (Signed)
I called the patient. The patient has had problems with abdominal gas, it is felt that the hydrocodone is the etiology of this. The patient is not having problems with constipation. I do not think that using Movantik is likely to help the issue therefore. The patient was seen by Dr. Tonita Cong for significant low back pain. Dr. Tonita Cong apparently was the patient on oxycodone for the back pain. I will discontinue the hydrocodone, and switch her to Percocet. I am not sure this which will help the abdominal issues, however.

## 2015-01-04 ENCOUNTER — Telehealth: Payer: Self-pay

## 2015-01-04 NOTE — Telephone Encounter (Signed)
Called patient to inform Rx ready for pick up at front desk. Spouse verbalized understanding. He states he will be by to pick up Rx today.

## 2015-01-06 ENCOUNTER — Other Ambulatory Visit: Payer: Self-pay | Admitting: Specialist

## 2015-01-06 DIAGNOSIS — M5126 Other intervertebral disc displacement, lumbar region: Secondary | ICD-10-CM

## 2015-01-09 ENCOUNTER — Other Ambulatory Visit: Payer: Self-pay | Admitting: Specialist

## 2015-01-09 DIAGNOSIS — M5126 Other intervertebral disc displacement, lumbar region: Secondary | ICD-10-CM

## 2015-01-11 ENCOUNTER — Telehealth: Payer: Self-pay | Admitting: Neurology

## 2015-01-11 ENCOUNTER — Other Ambulatory Visit: Payer: Self-pay | Admitting: Neurology

## 2015-01-11 NOTE — Telephone Encounter (Signed)
The patient is to have a myelogram, she has to come off the imipramine. She will go down to 25 mg at night for 5 days, beginning 8 days prior to the procedure. She'll be off of the medication for 3 days prior to the myelogram.

## 2015-01-11 NOTE — Telephone Encounter (Signed)
Pt is calling stating she has to come off of imipramine (TOFRANIL) 25 MG tablet for 3 days due to a procedure she is having.  She needs to directions of how to come off of medication.  Please call and advise.  If there is no answer you may leave a detailed message with her husband.

## 2015-01-23 ENCOUNTER — Ambulatory Visit
Admission: RE | Admit: 2015-01-23 | Discharge: 2015-01-23 | Disposition: A | Discharge status: Home / Self Care | Payer: BLUE CROSS/BLUE SHIELD | Source: Ambulatory Visit | Attending: Specialist | Admitting: Specialist

## 2015-01-23 ENCOUNTER — Other Ambulatory Visit: Payer: Self-pay | Admitting: Specialist

## 2015-01-23 ENCOUNTER — Inpatient Hospital Stay
Admission: RE | Admit: 2015-01-23 | Discharge: 2015-01-23 | Disposition: A | Discharge status: Home / Self Care | Payer: Self-pay | Source: Ambulatory Visit | Attending: Specialist | Admitting: Specialist

## 2015-01-23 DIAGNOSIS — M5126 Other intervertebral disc displacement, lumbar region: Secondary | ICD-10-CM

## 2015-01-23 IMAGING — RF DG MYELOGRAPHY LUMBAR INJ LUMBOSACRAL
13 of 14 series · 13 of 14 positions shown · non-contrast
Comparison: Lumbar spine MRI 11/18/2014 from [REDACTED]

CLINICAL DATA: Low back pain.  No radicular complaints.
TECHNIQUE: Contiguous axial images were obtained through the Lumbar spine after
the intrathecal infusion of contrast. Coronal and sagittal
reconstructions were obtained of the axial image sets.

[Series 1: (hospital) · 1 of 1 slices shown]
[im 1/1]
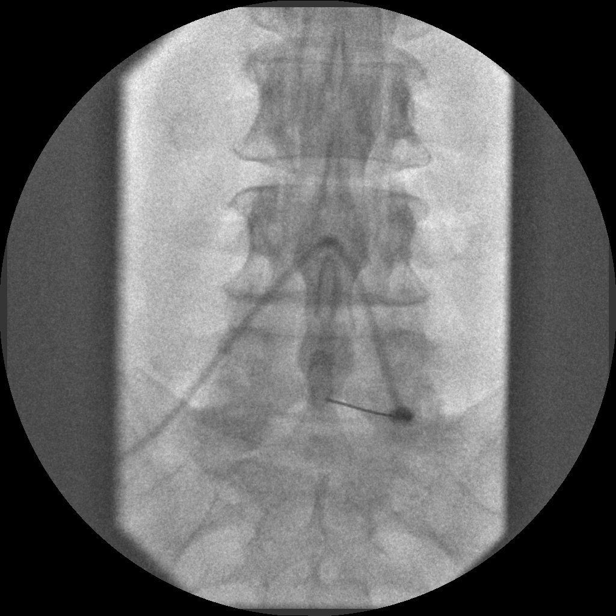

[Series 2: myelogram  white · 1 of 1 slices shown (1 of 12)]
[im 1/1]
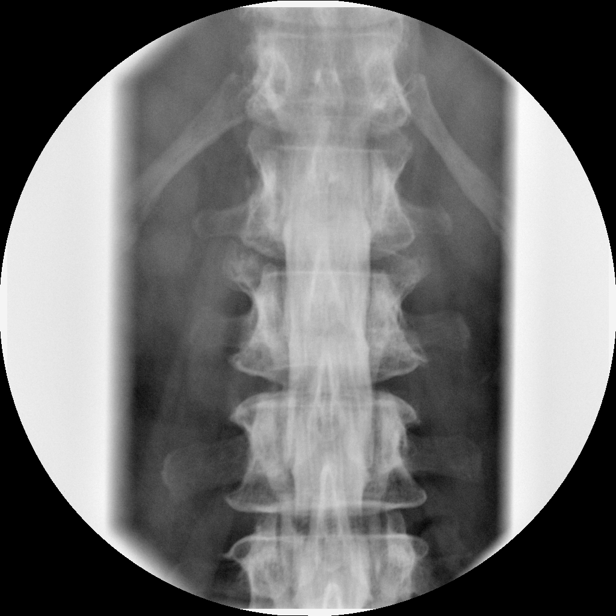

[Series 3: myelogram  white · 1 of 1 slices shown (2 of 12)]
[im 1/1]
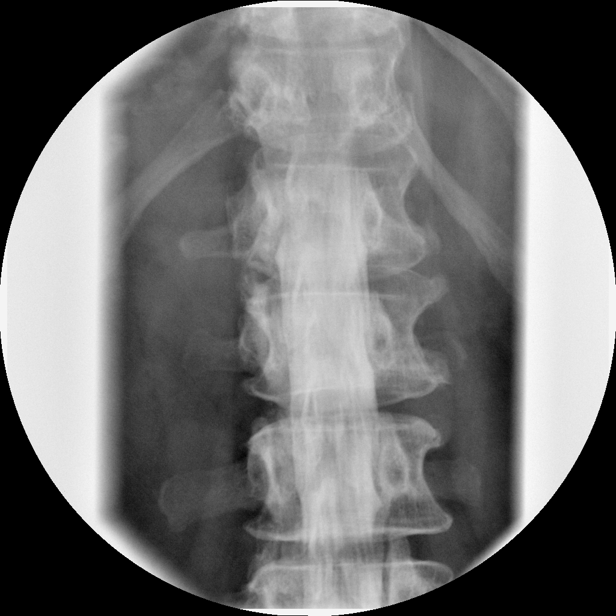

[Series 4: myelogram  white · 1 of 1 slices shown (3 of 12)]
[im 1/1]
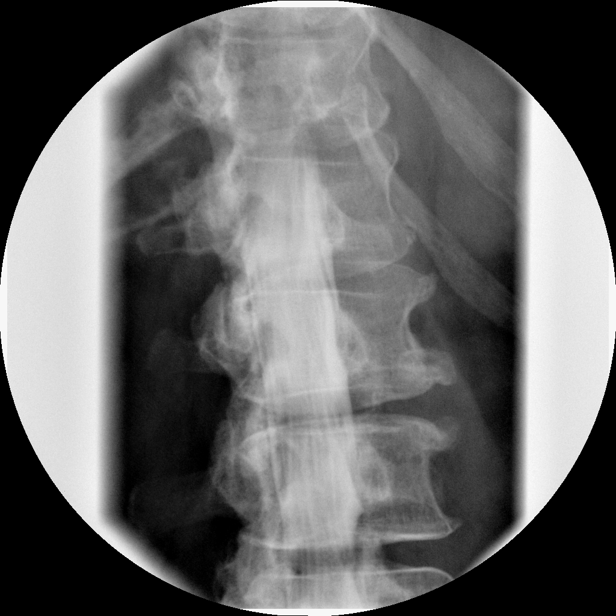

[Series 5: myelogram  white · 1 of 1 slices shown (4 of 12)]
[im 1/1]
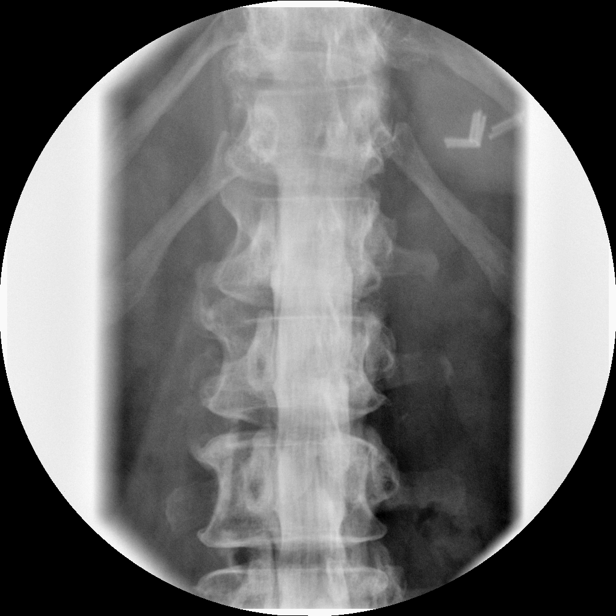

[Series 6: myelogram  white · 1 of 1 slices shown (5 of 12)]
[im 1/1]
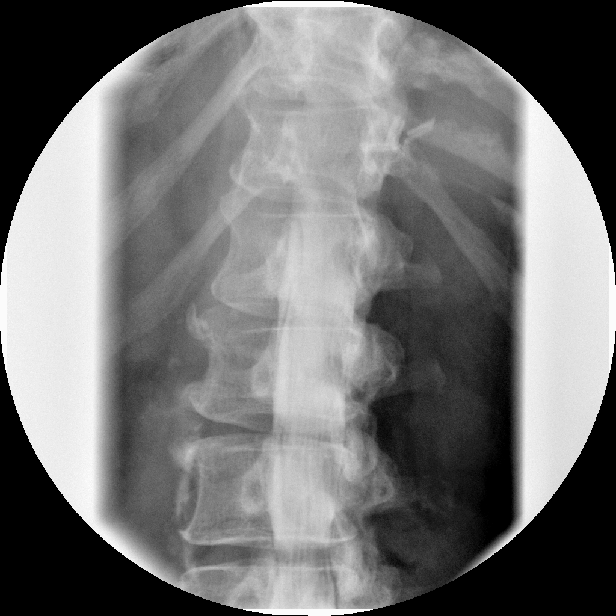

[Series 8: myelogram  white · 1 of 1 slices shown (6 of 12)]
[im 1/1]
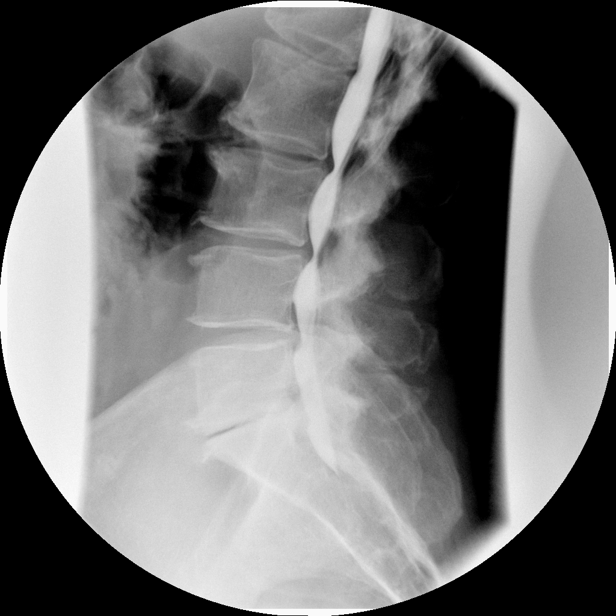

[Series 9: myelogram  white · 1 of 1 slices shown (7 of 12)]
[im 1/1]
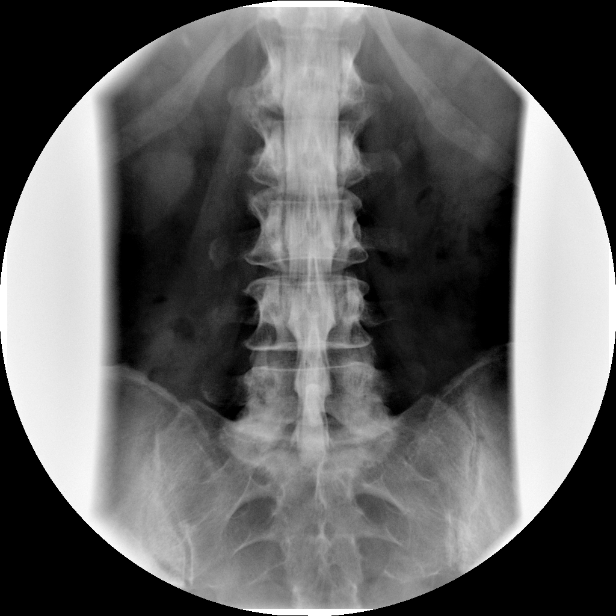

[Series 10: myelogram  white · 1 of 1 slices shown (8 of 12)]
[im 1/1]
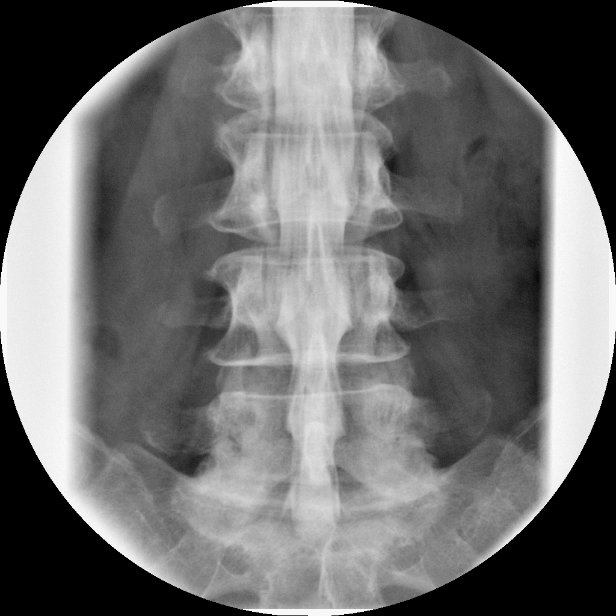

[Series 11: myelogram  white · 1 of 1 slices shown (9 of 12)]
[im 1/1]
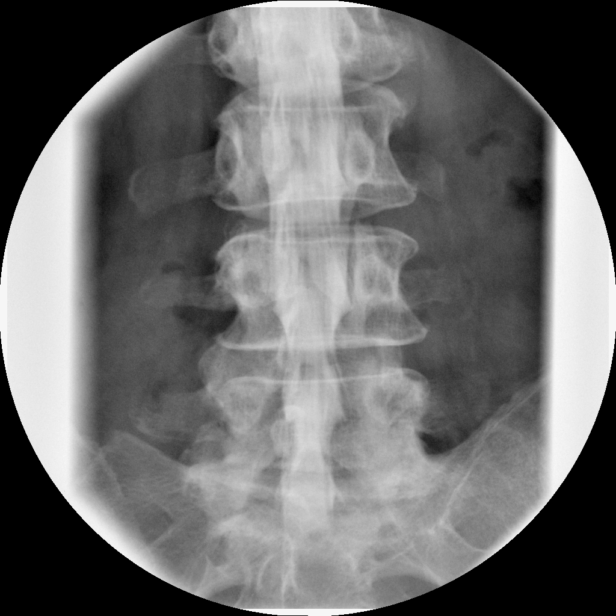

[Series 12: myelogram  white · 1 of 1 slices shown (10 of 12)]
[im 1/1]
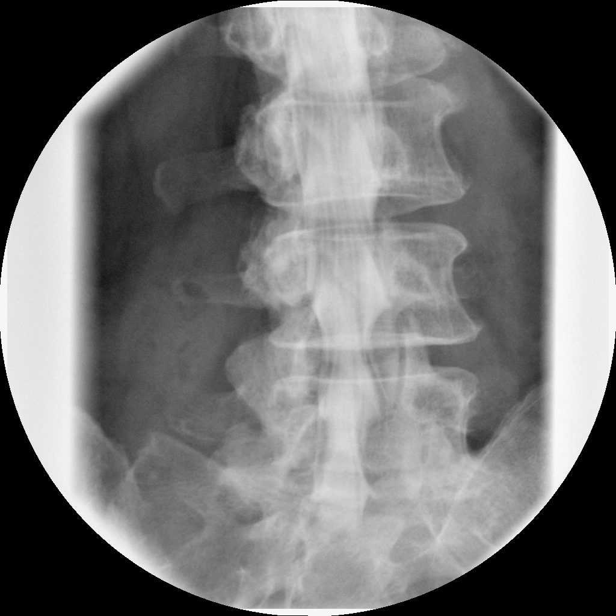

[Series 13: myelogram  white · 1 of 1 slices shown (11 of 12)]
[im 1/1]
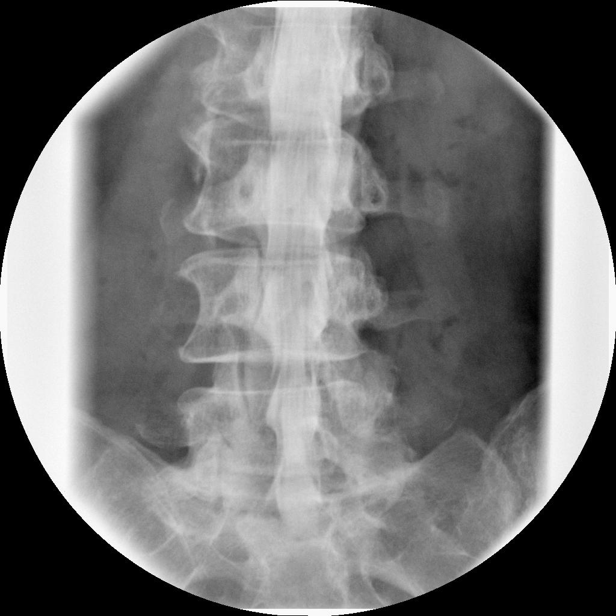

[Series 14: myelogram  white · 1 of 1 slices shown (12 of 12)]
[im 1/1]
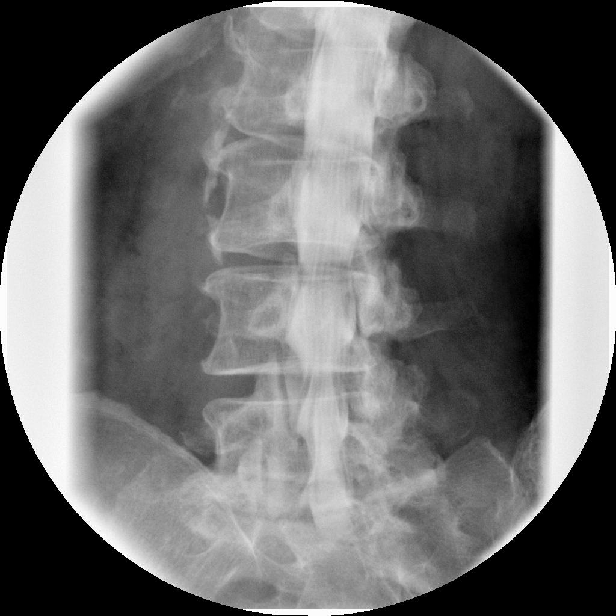

[13 of 14 positions shown; findings below may reference images not displayed]

EXAM:
LUMBAR MYELOGRAM

FLUOROSCOPY TIME:  Fluoroscopy Time (in minutes and seconds): 0
minutes 40 seconds

Number of Acquired Images:  13

PROCEDURE:
After thorough discussion of risks and benefits of the procedure
including bleeding, infection, injury to nerves, blood vessels,
adjacent structures as well as headache and CSF leak, written and
oral informed consent was obtained. Consent was obtained by Dr.
Labelle Salha Brack Tiger. Time out form was completed.

Patient was positioned prone on the fluoroscopy table. Local
anesthesia was provided with 1% lidocaine without epinephrine after
prepped and draped in the usual sterile fashion. Puncture was
performed at L5-S1 using a 3 1/2 inch 22-gauge spinal needle via a
right paramedian approach. Using a single pass through the dura, the
needle was placed within the thecal sac, with return of clear CSF.
15 mL of Emnipaque-ZDT was injected into the thecal sac, with normal
opacification of the nerve roots and cauda equina consistent with
free flow within the subarachnoid space.

I personally performed the lumbar puncture and administered the
intrathecal contrast. I also personally supervised acquisition of
the myelogram images.
FINDINGS: LUMBAR MYELOGRAM FINDINGS:

Vertebral alignment is normal, without evidence of listhesis with
supine or upright neutral, flexion, or extension positioning.
Moderate-sized ventral extradural defects are present from L2-3 to
L4-5, most notably at L3-4 where there is evidence of mild to
moderate spinal stenosis. The extradural defect at L4-5 appears
minimally larger with standing.

CT LUMBAR MYELOGRAM FINDINGS:

Vertebral alignment is normal. Vertebral body heights are preserved.
There is severe disc space narrowing at L5-S1 and with prominent
adjacent vertebral sclerosis, osteophytosis, and vacuum disc
phenomenon. Mild vacuum disc is also noted at L4-5. Marginal
osteophytes are noted at L2-3 greater than L3-4 and L4-5. There is
mild diffuse narrowing of the mid lumbar spinal canal on a
congenital basis due to short pedicles. Conus medullaris terminates
at L1. SI joint degenerative changes are noted. There is moderate
atherosclerotic aortic calcification. Cholecystectomy clips are
partially visualized.

L1-2: Partially calcified left extraforaminal disc protrusion
without stenosis or evidence of neural impingement.

L2-3: Mild-to-moderate circumferential disc bulging and mild facet
and ligamentum flavum hypertrophy result in mild spinal stenosis and
minimal left neural foraminal narrowing, unchanged.

L3-4: Moderate circumferential disc bulging and moderate facet and
ligamentum flavum hypertrophy result in mild-to-moderate spinal
stenosis and mild bilateral neural foraminal stenosis, unchanged.

L4-5: Mild-to-moderate disc bulging mildly asymmetric to the right
and moderate facet and ligamentum flavum hypertrophy result in
borderline spinal stenosis and mild right greater than left neural
foraminal stenosis, unchanged.

L5-S1: Diffuse endplate osteophytosis, severe disc space height
loss, and moderate facet arthrosis results in moderate to severe
bilateral neural foraminal stenosis without spinal stenosis,
unchanged.
IMPRESSION: 1. Severe disc degeneration at L5-S1 with moderate to severe
bilateral neural foraminal stenosis.
2. Mild-to-moderate disc and facet degeneration elsewhere in the
lumbar spine as above, most notably at L3-4 where there is
mild-to-moderate spinal stenosis.

## 2015-01-23 IMAGING — CR DG MYELOGRAPHY LUMBAR INJ LUMBOSACRAL
6 series · 6 of 6 positions shown · non-contrast
Comparison: Lumbar spine MRI 11/18/2014 from [REDACTED]

CLINICAL DATA: Low back pain.  No radicular complaints.
TECHNIQUE: Contiguous axial images were obtained through the Lumbar spine after
the intrathecal infusion of contrast. Coronal and sagittal
reconstructions were obtained of the axial image sets.

[w lumbar spine ap (1 of 3)]
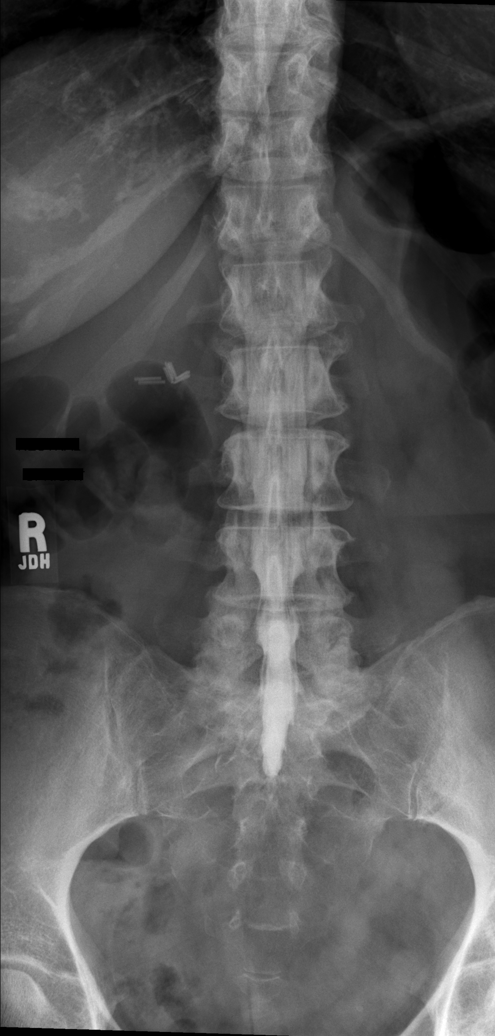

[w lumbar spine ap (2 of 3)]
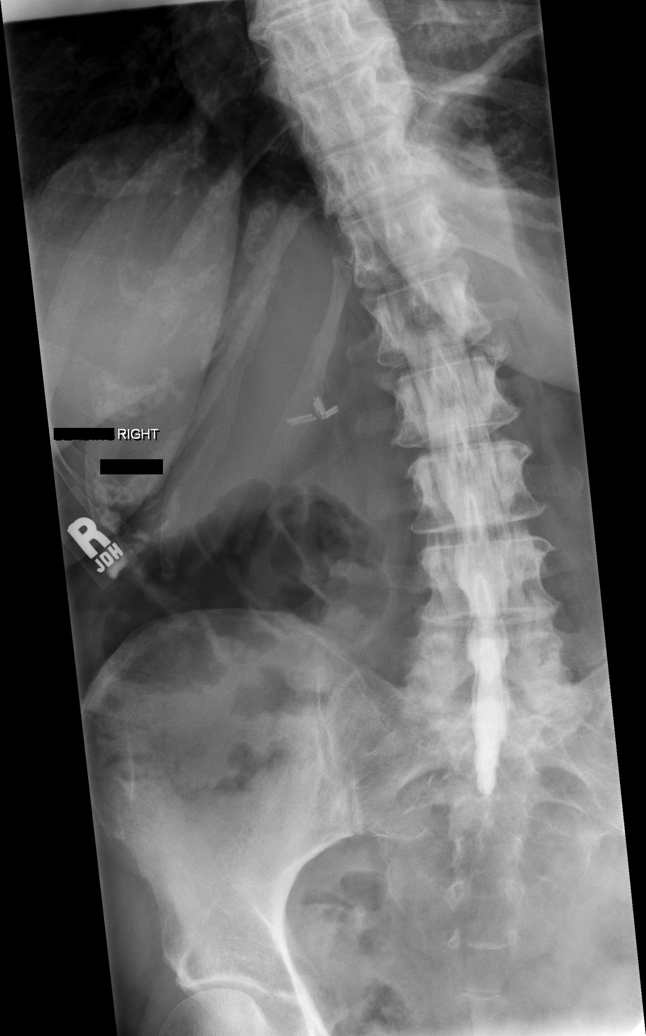

[w lumbar spine ap (3 of 3)]
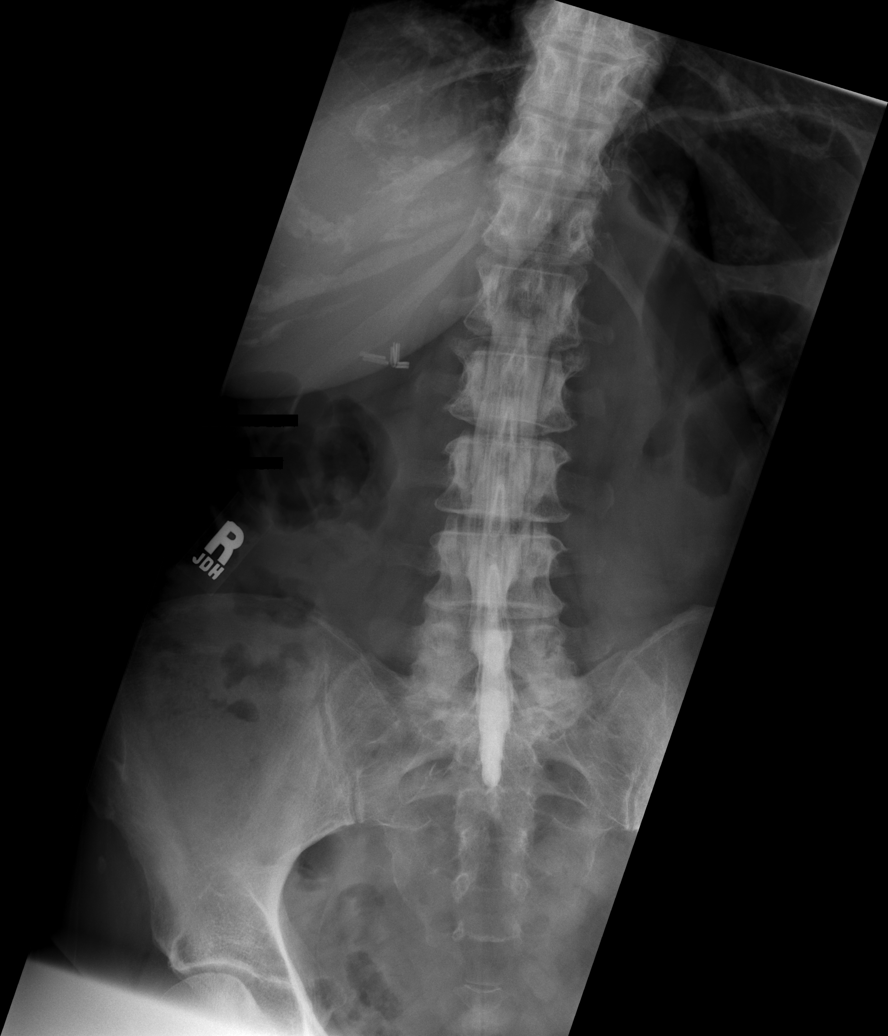

[w lumbar spine lat]
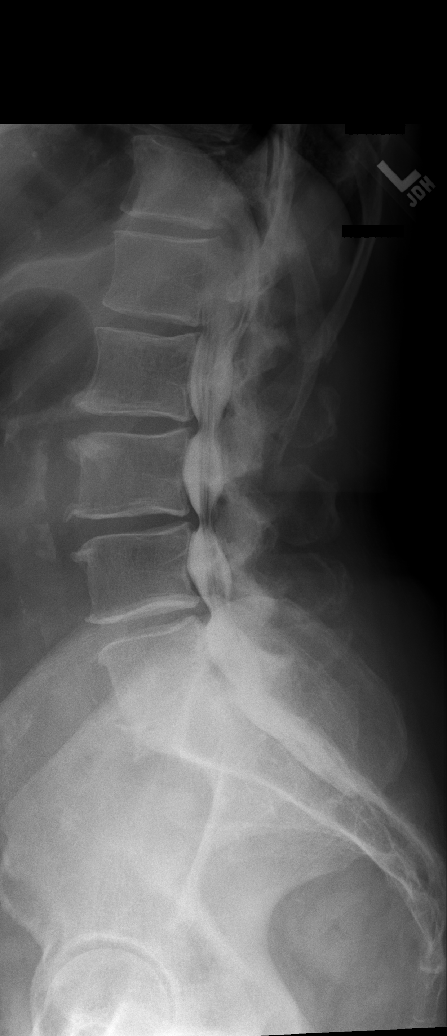

[w lumbar spine flexion]
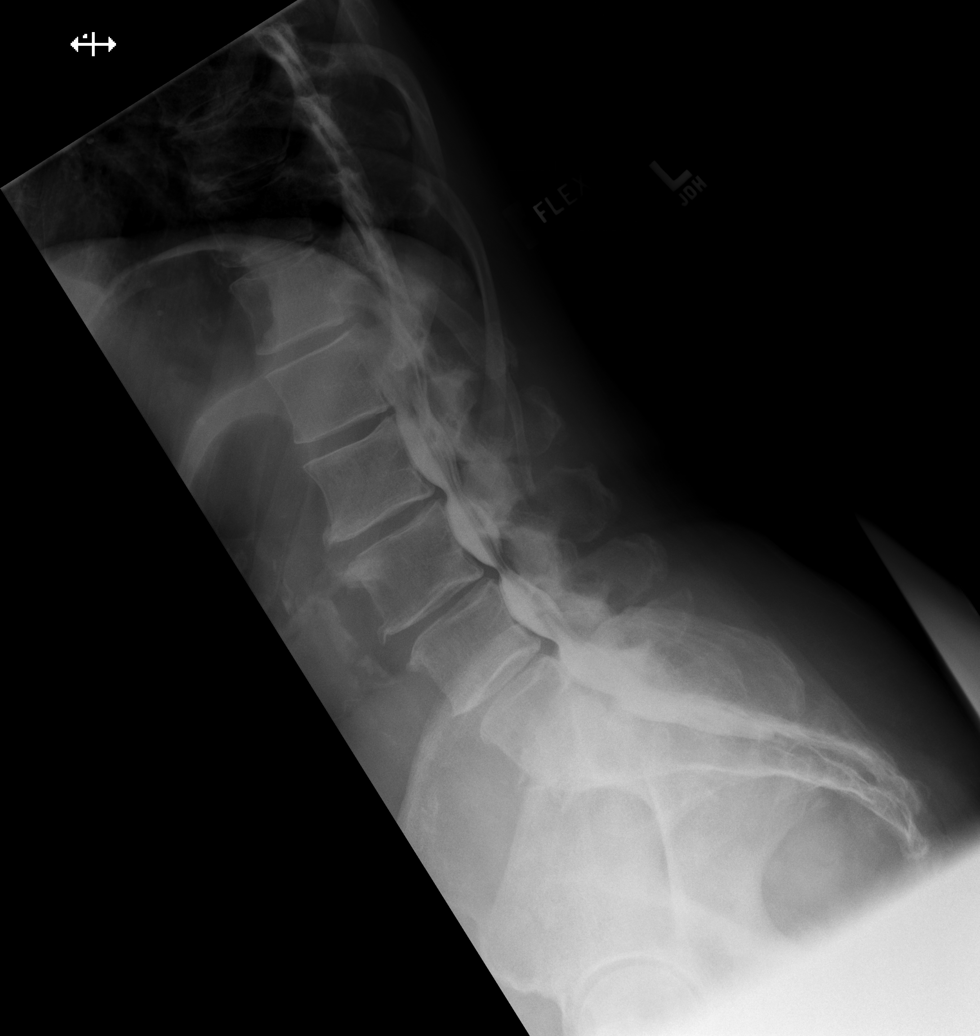

[w lumbar spine extension]
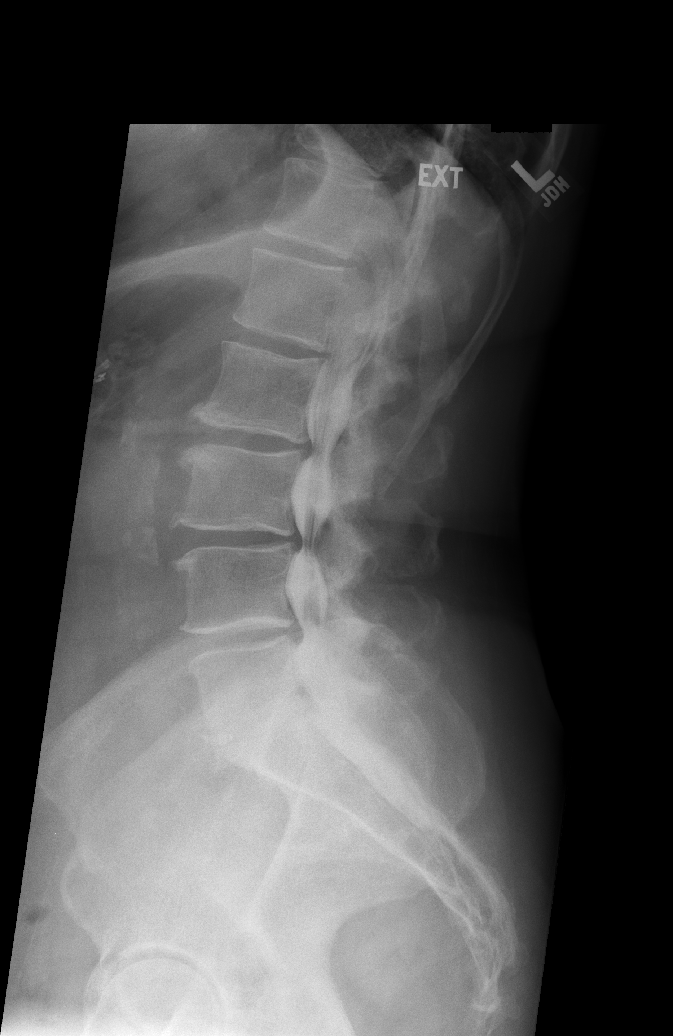

[6 of 6 positions shown; findings below may reference images not displayed]

EXAM:
LUMBAR MYELOGRAM

FLUOROSCOPY TIME:  Fluoroscopy Time (in minutes and seconds): 0
minutes 40 seconds

Number of Acquired Images:  13

PROCEDURE:
After thorough discussion of risks and benefits of the procedure
including bleeding, infection, injury to nerves, blood vessels,
adjacent structures as well as headache and CSF leak, written and
oral informed consent was obtained. Consent was obtained by Dr.
Labelle Salha Brack Tiger. Time out form was completed.

Patient was positioned prone on the fluoroscopy table. Local
anesthesia was provided with 1% lidocaine without epinephrine after
prepped and draped in the usual sterile fashion. Puncture was
performed at L5-S1 using a 3 1/2 inch 22-gauge spinal needle via a
right paramedian approach. Using a single pass through the dura, the
needle was placed within the thecal sac, with return of clear CSF.
15 mL of Emnipaque-ZDT was injected into the thecal sac, with normal
opacification of the nerve roots and cauda equina consistent with
free flow within the subarachnoid space.

I personally performed the lumbar puncture and administered the
intrathecal contrast. I also personally supervised acquisition of
the myelogram images.
FINDINGS: LUMBAR MYELOGRAM FINDINGS:

Vertebral alignment is normal, without evidence of listhesis with
supine or upright neutral, flexion, or extension positioning.
Moderate-sized ventral extradural defects are present from L2-3 to
L4-5, most notably at L3-4 where there is evidence of mild to
moderate spinal stenosis. The extradural defect at L4-5 appears
minimally larger with standing.

CT LUMBAR MYELOGRAM FINDINGS:

Vertebral alignment is normal. Vertebral body heights are preserved.
There is severe disc space narrowing at L5-S1 and with prominent
adjacent vertebral sclerosis, osteophytosis, and vacuum disc
phenomenon. Mild vacuum disc is also noted at L4-5. Marginal
osteophytes are noted at L2-3 greater than L3-4 and L4-5. There is
mild diffuse narrowing of the mid lumbar spinal canal on a
congenital basis due to short pedicles. Conus medullaris terminates
at L1. SI joint degenerative changes are noted. There is moderate
atherosclerotic aortic calcification. Cholecystectomy clips are
partially visualized.

L1-2: Partially calcified left extraforaminal disc protrusion
without stenosis or evidence of neural impingement.

L2-3: Mild-to-moderate circumferential disc bulging and mild facet
and ligamentum flavum hypertrophy result in mild spinal stenosis and
minimal left neural foraminal narrowing, unchanged.

L3-4: Moderate circumferential disc bulging and moderate facet and
ligamentum flavum hypertrophy result in mild-to-moderate spinal
stenosis and mild bilateral neural foraminal stenosis, unchanged.

L4-5: Mild-to-moderate disc bulging mildly asymmetric to the right
and moderate facet and ligamentum flavum hypertrophy result in
borderline spinal stenosis and mild right greater than left neural
foraminal stenosis, unchanged.

L5-S1: Diffuse endplate osteophytosis, severe disc space height
loss, and moderate facet arthrosis results in moderate to severe
bilateral neural foraminal stenosis without spinal stenosis,
unchanged.
IMPRESSION: 1. Severe disc degeneration at L5-S1 with moderate to severe
bilateral neural foraminal stenosis.
2. Mild-to-moderate disc and facet degeneration elsewhere in the
lumbar spine as above, most notably at L3-4 where there is
mild-to-moderate spinal stenosis.

## 2015-01-23 IMAGING — CT CT L SPINE W/ CM
3 of 10 series · 9 of 33 positions shown, 11 images · non-contrast
Comparison: Lumbar spine MRI 11/18/2014 from [REDACTED]

CLINICAL DATA: Low back pain.  No radicular complaints.
TECHNIQUE: Contiguous axial images were obtained through the Lumbar spine after
the intrathecal infusion of contrast. Coronal and sagittal
reconstructions were obtained of the axial image sets.

[Series 3: l spine 3.0 b41s · axial · 0.27mm/px · z∈[-195,-195]mm · 1 of 83 slices shown, 2 images]
[im 50/83  soft-tissue]
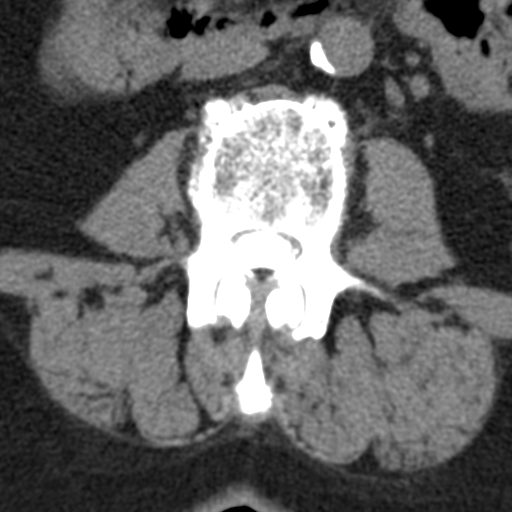
[im 50/83  bone]
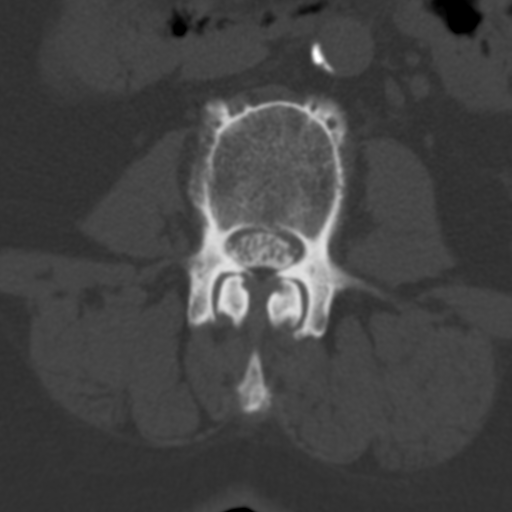

[Series 7: l spine bone cor upper · coronal · 0.27mm/px · 3 of 44 slices shown]
[im 11/44  bone]
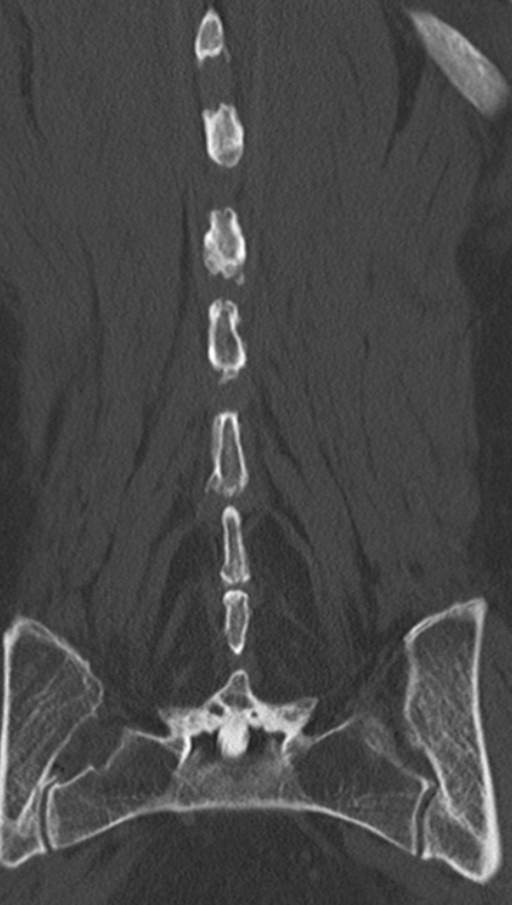
[im 22/44  bone]
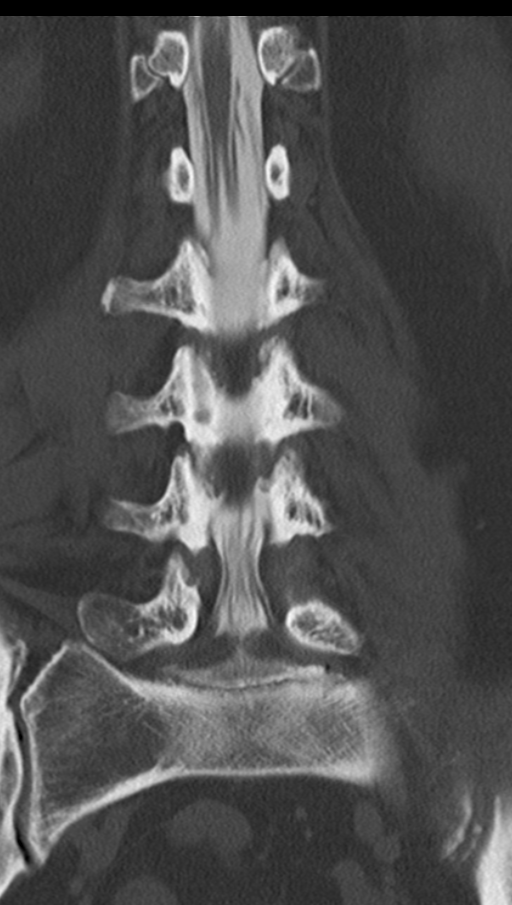
[im 33/44  bone]
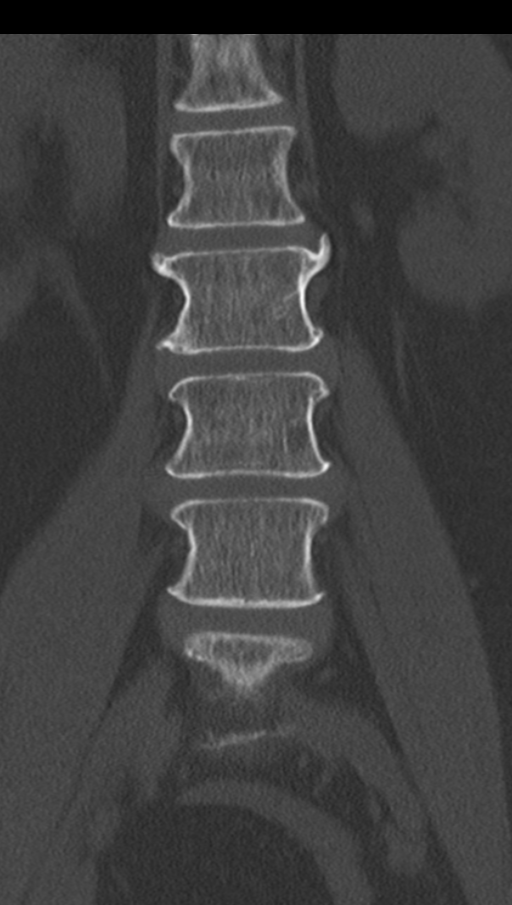

[Series 9: l spine sag bone sag · sagittal · 0.27mm/px · 5 of 50 slices shown, 6 images]
[im 17/50  bone]
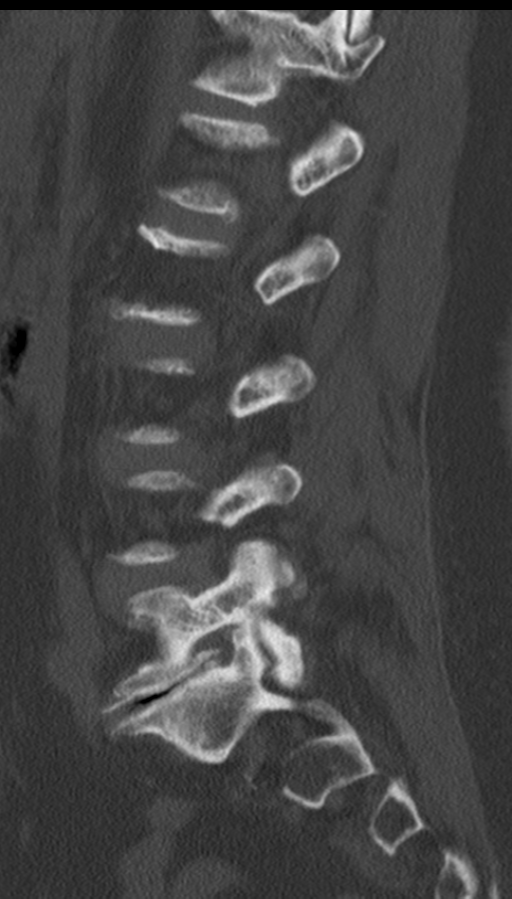
[im 21/50  bone]
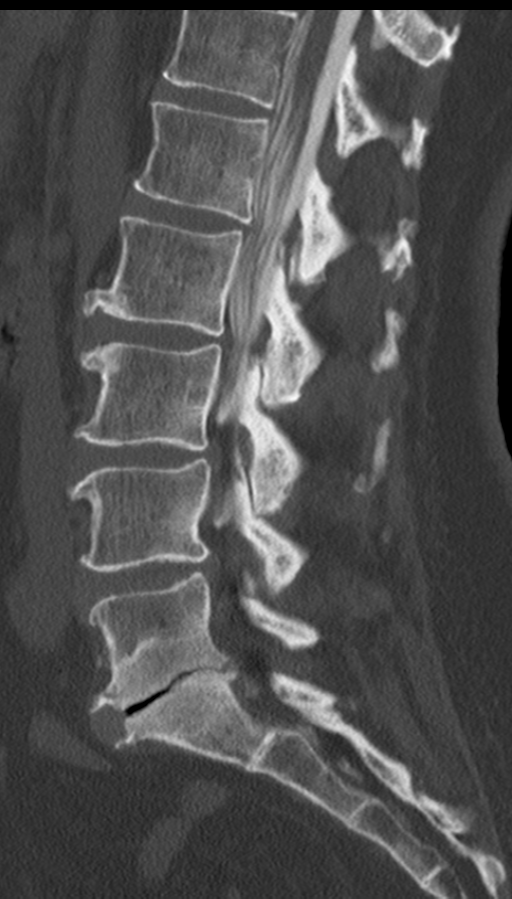
[im 25/50  soft-tissue]
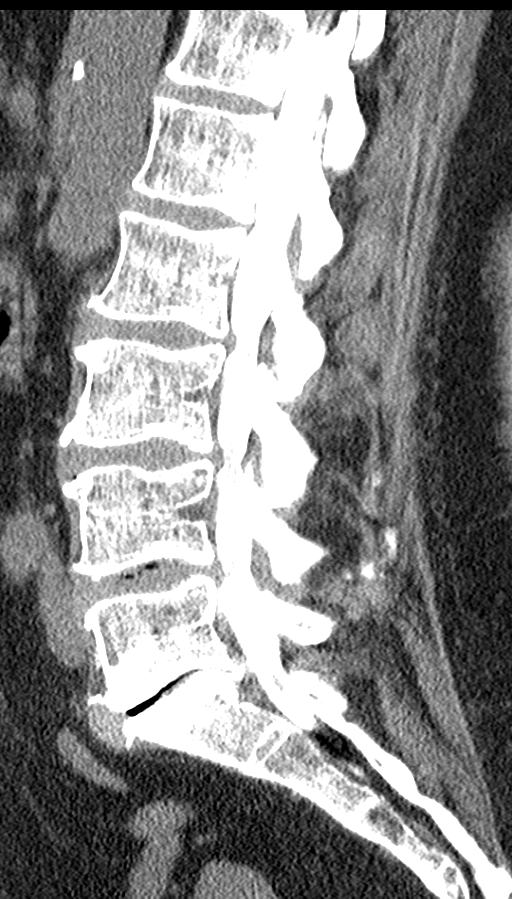
[im 25/50  bone]
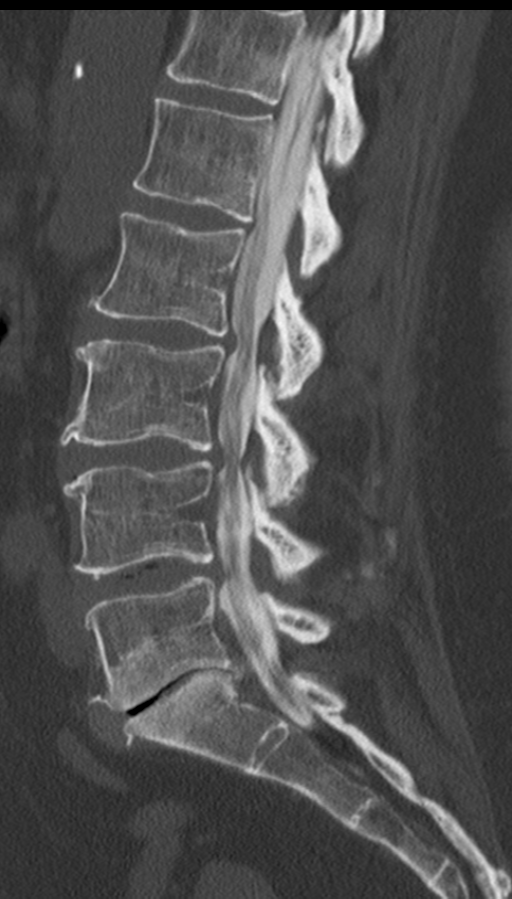
[im 29/50  bone]
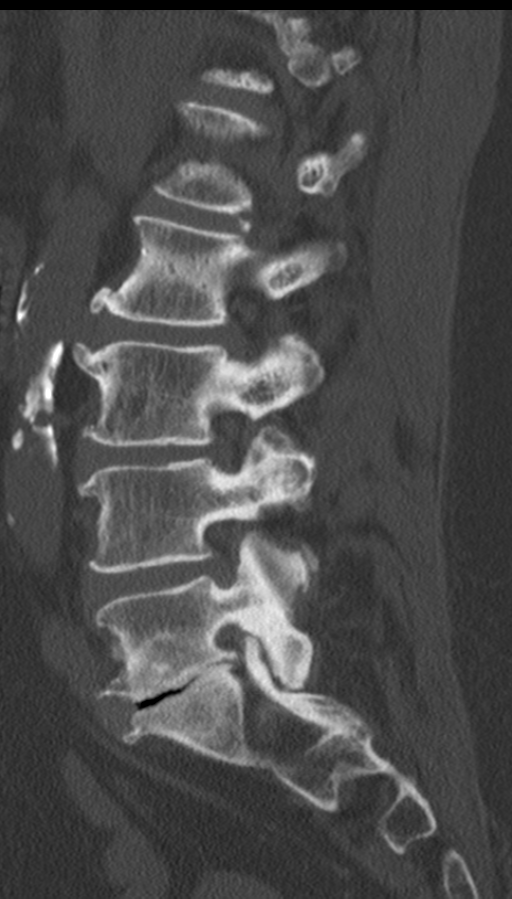
[im 33/50  bone]
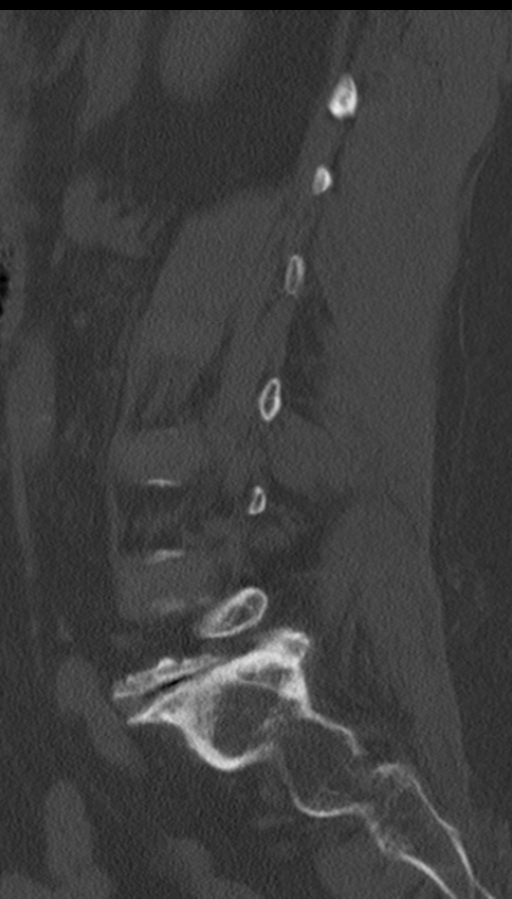

[9 of 33 positions shown; findings below may reference images not displayed]

EXAM:
LUMBAR MYELOGRAM

FLUOROSCOPY TIME:  Fluoroscopy Time (in minutes and seconds): 0
minutes 40 seconds

Number of Acquired Images:  13

PROCEDURE:
After thorough discussion of risks and benefits of the procedure
including bleeding, infection, injury to nerves, blood vessels,
adjacent structures as well as headache and CSF leak, written and
oral informed consent was obtained. Consent was obtained by Dr.
Labelle Salha Brack Tiger. Time out form was completed.

Patient was positioned prone on the fluoroscopy table. Local
anesthesia was provided with 1% lidocaine without epinephrine after
prepped and draped in the usual sterile fashion. Puncture was
performed at L5-S1 using a 3 1/2 inch 22-gauge spinal needle via a
right paramedian approach. Using a single pass through the dura, the
needle was placed within the thecal sac, with return of clear CSF.
15 mL of Emnipaque-ZDT was injected into the thecal sac, with normal
opacification of the nerve roots and cauda equina consistent with
free flow within the subarachnoid space.

I personally performed the lumbar puncture and administered the
intrathecal contrast. I also personally supervised acquisition of
the myelogram images.
FINDINGS: LUMBAR MYELOGRAM FINDINGS:

Vertebral alignment is normal, without evidence of listhesis with
supine or upright neutral, flexion, or extension positioning.
Moderate-sized ventral extradural defects are present from L2-3 to
L4-5, most notably at L3-4 where there is evidence of mild to
moderate spinal stenosis. The extradural defect at L4-5 appears
minimally larger with standing.

CT LUMBAR MYELOGRAM FINDINGS:

Vertebral alignment is normal. Vertebral body heights are preserved.
There is severe disc space narrowing at L5-S1 and with prominent
adjacent vertebral sclerosis, osteophytosis, and vacuum disc
phenomenon. Mild vacuum disc is also noted at L4-5. Marginal
osteophytes are noted at L2-3 greater than L3-4 and L4-5. There is
mild diffuse narrowing of the mid lumbar spinal canal on a
congenital basis due to short pedicles. Conus medullaris terminates
at L1. SI joint degenerative changes are noted. There is moderate
atherosclerotic aortic calcification. Cholecystectomy clips are
partially visualized.

L1-2: Partially calcified left extraforaminal disc protrusion
without stenosis or evidence of neural impingement.

L2-3: Mild-to-moderate circumferential disc bulging and mild facet
and ligamentum flavum hypertrophy result in mild spinal stenosis and
minimal left neural foraminal narrowing, unchanged.

L3-4: Moderate circumferential disc bulging and moderate facet and
ligamentum flavum hypertrophy result in mild-to-moderate spinal
stenosis and mild bilateral neural foraminal stenosis, unchanged.

L4-5: Mild-to-moderate disc bulging mildly asymmetric to the right
and moderate facet and ligamentum flavum hypertrophy result in
borderline spinal stenosis and mild right greater than left neural
foraminal stenosis, unchanged.

L5-S1: Diffuse endplate osteophytosis, severe disc space height
loss, and moderate facet arthrosis results in moderate to severe
bilateral neural foraminal stenosis without spinal stenosis,
unchanged.
IMPRESSION: 1. Severe disc degeneration at L5-S1 with moderate to severe
bilateral neural foraminal stenosis.
2. Mild-to-moderate disc and facet degeneration elsewhere in the
lumbar spine as above, most notably at L3-4 where there is
mild-to-moderate spinal stenosis.

## 2015-01-23 MED ORDER — MEPERIDINE HCL 100 MG/ML IJ SOLN
75.0000 mg | Freq: Once | INTRAMUSCULAR | Status: AC
  Administered 2015-01-23: 75 mg via INTRAMUSCULAR

## 2015-01-23 MED ORDER — IOHEXOL 180 MG/ML  SOLN
15.0000 mL | Freq: Once | INTRAMUSCULAR | Status: AC | PRN
  Administered 2015-01-23: 15 mL via INTRATHECAL

## 2015-01-23 MED ORDER — ONDANSETRON HCL 4 MG/2ML IJ SOLN
4.0000 mg | Freq: Once | INTRAMUSCULAR | Status: AC
  Administered 2015-01-23: 4 mg via INTRAMUSCULAR

## 2015-01-23 MED ORDER — ONDANSETRON HCL 4 MG/2ML IJ SOLN: 4.0000 mg | Freq: Four times a day (QID) | INTRAMUSCULAR | Status: DC | PRN

## 2015-01-23 MED ORDER — DIAZEPAM 5 MG PO TABS
10.0000 mg | ORAL_TABLET | Freq: Once | ORAL | Status: AC
  Administered 2015-01-23: 10 mg via ORAL

## 2015-01-23 NOTE — Discharge Instructions (Signed)
Myelogram Discharge Instructions  1. Go home and rest quietly for the next 24 hours.  It is important to lie flat for the next 24 hours.  Get up only to go to the restroom.  You may lie in the bed or on a couch on your back, your stomach, your left side or your right side.  You may have one pillow under your head.  You may have pillows between your knees while you are on your side or under your knees while you are on your back.  2. DO NOT drive today.  Recline the seat as far back as it will go, while still wearing your seat belt, on the way home.  3. You may get up to go to the bathroom as needed.  You may sit up for 10 minutes to eat.  You may resume your normal diet and medications unless otherwise indicated.  Drink lots of extra fluids today and tomorrow.  4. The incidence of headache, nausea, or vomiting is about 5% (one in 20 patients).  If you develop a headache, lie flat and drink plenty of fluids until the headache goes away.  Caffeinated beverages may be helpful.  If you develop severe nausea and vomiting or a headache that does not go away with flat bed rest, call [phone].  5. You may resume normal activities after your 24 hours of bed rest is over; however, do not exert yourself strongly or do any heavy lifting tomorrow. If when you get up you have a headache when standing, go back to bed and force fluids for another 24 hours.  6. Call your physician for a follow-up appointment.  The results of your myelogram will be sent directly to your physician by the following day.  7. If you have any questions or if complications develop after you arrive home, please call [phone].  Discharge instructions have been explained to the patient.  The patient, or the person responsible for the patient, fully understands these instructions.        May resume Imitrex, Seroquel and Imipramine on Aprill 26, 2016, after 11:00 am.

## 2015-01-24 ENCOUNTER — Telehealth: Payer: Self-pay | Admitting: Radiology

## 2015-01-24 NOTE — Telephone Encounter (Signed)
Pt states always she always has a headache that is not affected by position. She is c/o nausea that started today with out increase in headache. Explained this could be from the medications she had to stop and that after she goes back on the medications that her symptoms should go away.

## 2015-01-25 ENCOUNTER — Telehealth: Payer: Self-pay | Admitting: Neurology

## 2015-01-25 MED ORDER — IMIPRAMINE HCL 25 MG PO TABS: ORAL_TABLET | ORAL | Status: DC

## 2015-01-25 NOTE — Telephone Encounter (Signed)
Patient called and stated she has a severe migraine and she is taking her medication Rx. imipramine (TOFRANIL) 25 MG tablet but it is not helping. She wants to know if she can take a higher mg of the Rx. She would like for someone to call back after 6:00. Please call and advise.

## 2015-01-25 NOTE — Telephone Encounter (Signed)
I called the patient. The patient had come down off of the imipramine for the myelogram. But she is getting back up on the medication. After she has been on 50 mg for one week, she can go to 75 mg at night. I will call in a prescription to reflect this.

## 2015-01-26 ENCOUNTER — Telehealth: Payer: Self-pay | Admitting: Neurology

## 2015-01-26 NOTE — Telephone Encounter (Signed)
I called the patient. The patient had one to talk to me today, I did call, the patient does not want to talk now, she wants me to call tomorrow.

## 2015-01-26 NOTE — Telephone Encounter (Signed)
Patient called wanting to speak with DR. Willis regarding her having an occipital nerve block. Patient wants to get DR. Jannifer Franklin' opinion on having this procedure since it is costly  Please call and advice # [phone]

## 2015-01-27 NOTE — Telephone Encounter (Signed)
I called patient. The patient may be interested in trying nerve blocks for the headache, these are generally used as rescue medication, but they want to try this anyway. They also inquired about the use of Frova, Amerge, and Axert. These are triptan medications again use for rescue of migraine, not for daily medications. They are to go up on imipramine as planned.

## 2015-02-09 ENCOUNTER — Telehealth: Payer: Self-pay

## 2015-02-09 ENCOUNTER — Other Ambulatory Visit: Payer: Self-pay | Admitting: Neurology

## 2015-02-09 MED ORDER — OXYCODONE-ACETAMINOPHEN 5-325 MG PO TABS: 1.0000 | ORAL_TABLET | Freq: Four times a day (QID) | ORAL | Status: DC | PRN

## 2015-02-09 MED ORDER — SUMATRIPTAN SUCCINATE 100 MG PO TABS: 100.0000 mg | ORAL_TABLET | Freq: Two times a day (BID) | ORAL | Status: DC | PRN

## 2015-02-09 NOTE — Telephone Encounter (Signed)
Patient called and requested a refill on Rx. SUMAtriptan (IMITREX) 100 MG tablet and Rx. oxyCODONE-acetaminophen (ROXICET) 5-325 MG per tablet. Please call and advise.

## 2015-02-09 NOTE — Telephone Encounter (Signed)
Rx ready for pick up. 

## 2015-02-09 NOTE — Telephone Encounter (Signed)
Request entered, forwarded to provider for approval.  

## 2015-02-20 ENCOUNTER — Other Ambulatory Visit: Payer: Self-pay | Admitting: Family Medicine

## 2015-02-20 DIAGNOSIS — M79604 Pain in right leg: Secondary | ICD-10-CM

## 2015-02-20 DIAGNOSIS — M7989 Other specified soft tissue disorders: Secondary | ICD-10-CM

## 2015-02-21 ENCOUNTER — Ambulatory Visit
Admission: RE | Admit: 2015-02-21 | Discharge: 2015-02-21 | Disposition: A | Discharge status: Home / Self Care | Payer: BLUE CROSS/BLUE SHIELD | Source: Ambulatory Visit | Attending: Family Medicine | Admitting: Family Medicine

## 2015-02-21 DIAGNOSIS — M79604 Pain in right leg: Secondary | ICD-10-CM

## 2015-02-21 DIAGNOSIS — M7989 Other specified soft tissue disorders: Secondary | ICD-10-CM

## 2015-02-21 IMAGING — US US EXTREM LOW VENOUS*R*
1 series · 13 of 24 positions shown · non-contrast
Comparison: None.

CLINICAL DATA: Right lower extremity pain and edema. Fall on
01/26/2015.



[Series 1: us extrem low venous*right* · 0.09mm/px · 13 of 42 slices shown]
[im 1/42]
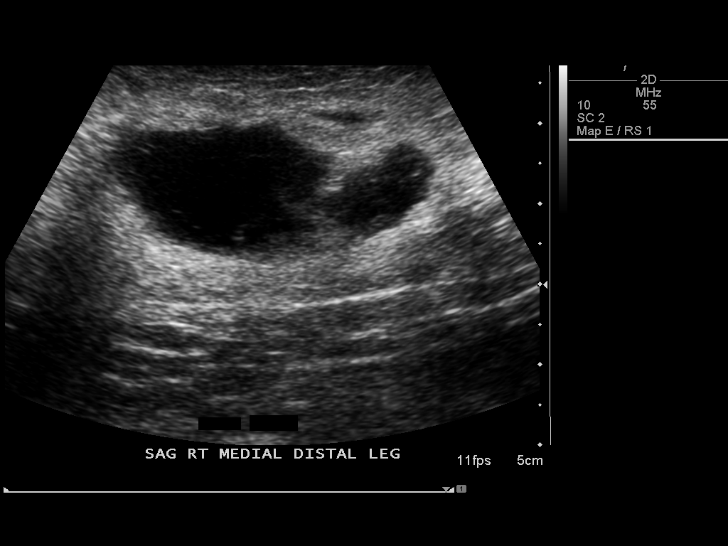
[im 4/42]
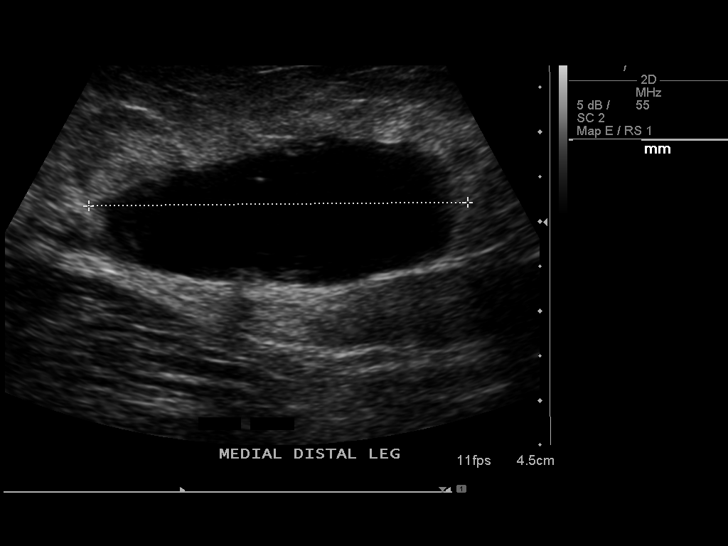
[im 8/42]
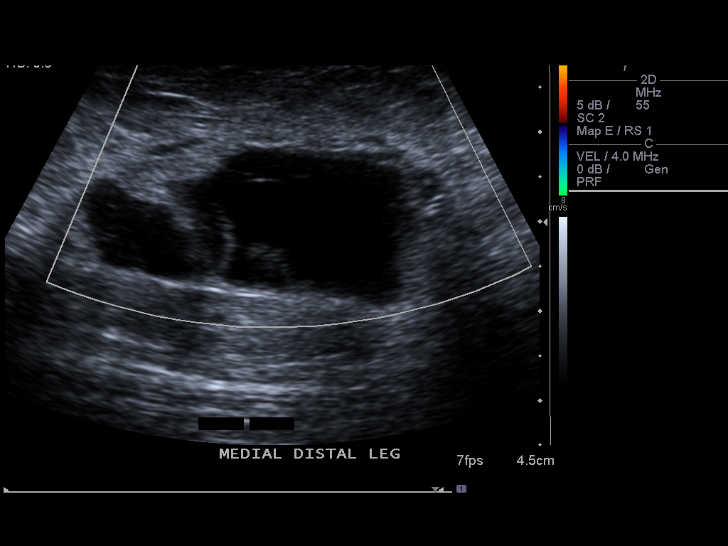
[im 11/42]
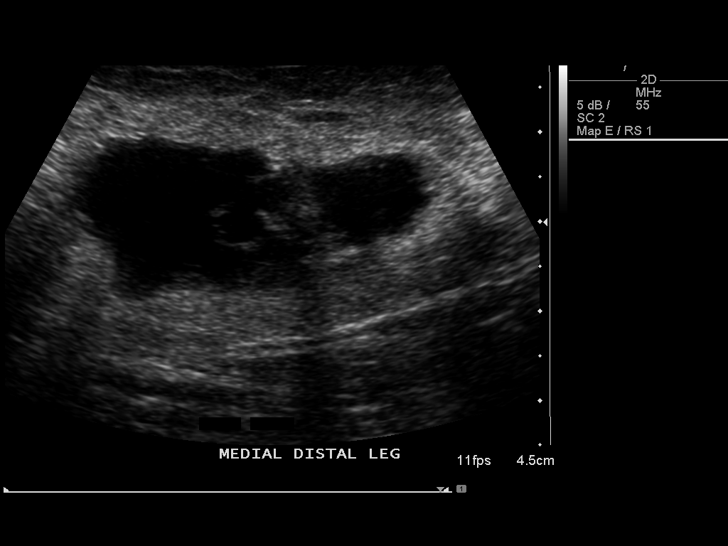
[im 15/42]
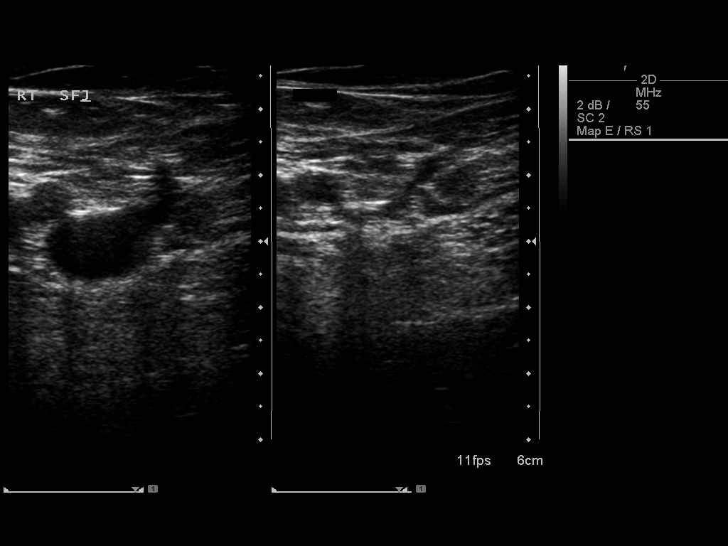
[im 18/42]
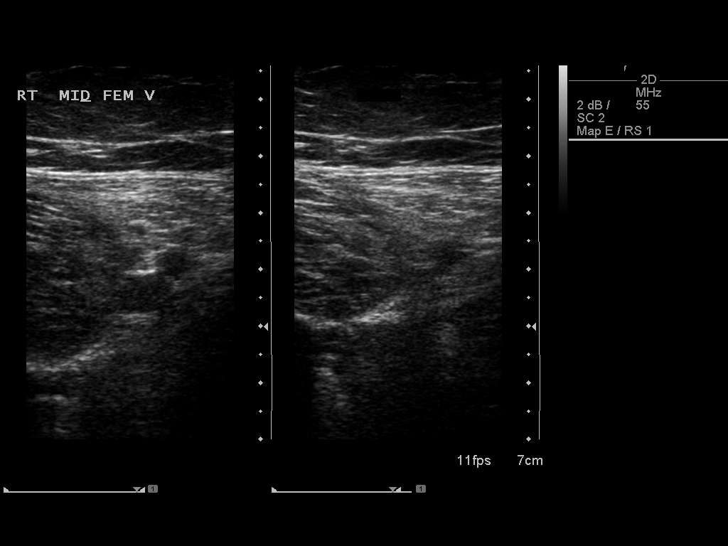
[im 22/42]
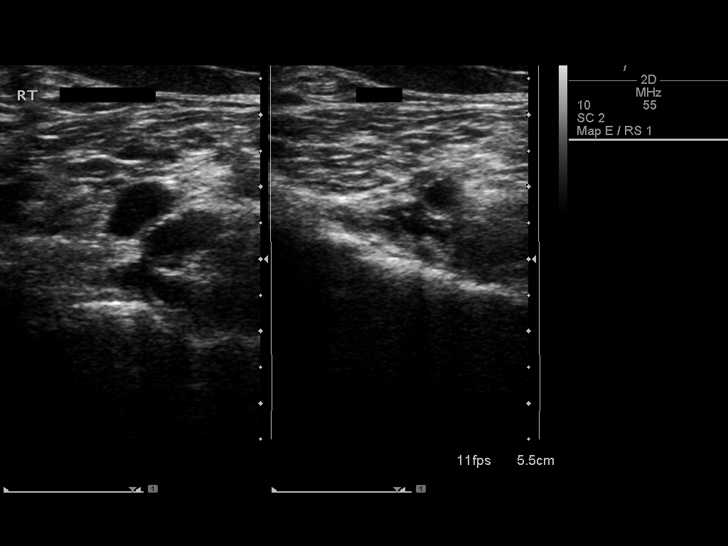
[im 24/42]
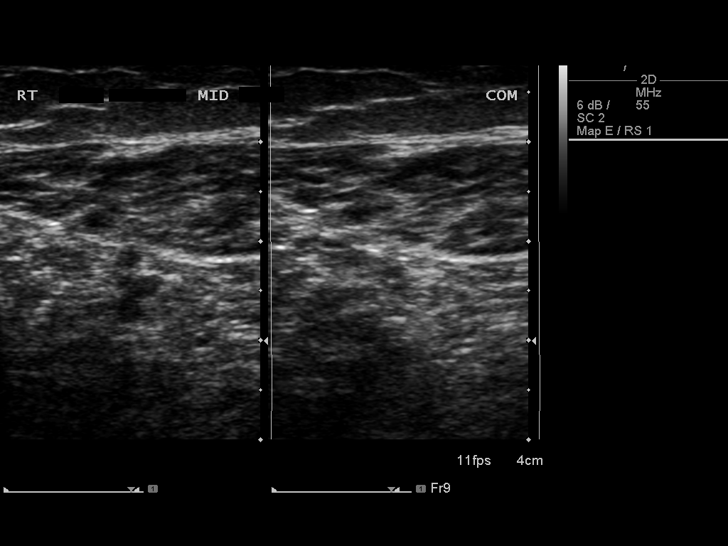
[im 27/42]
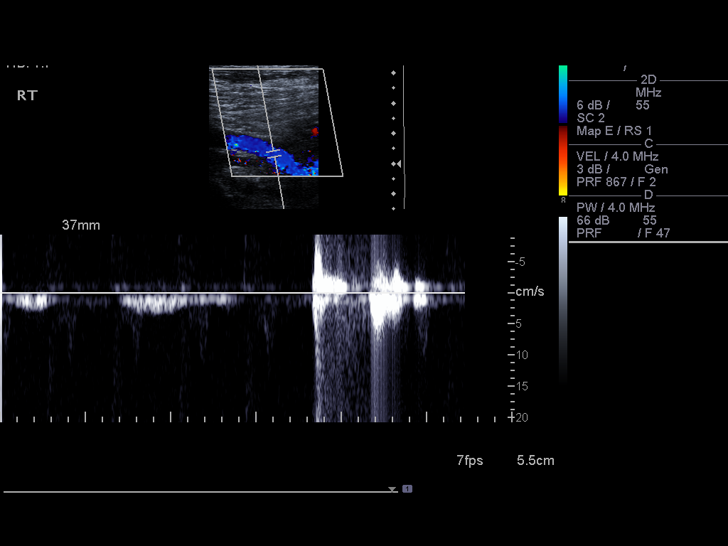
[im 31/42]
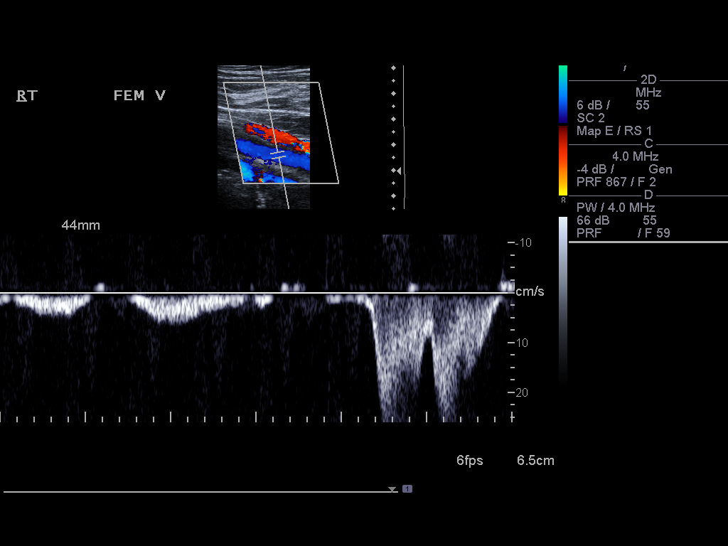
[im 34/42]
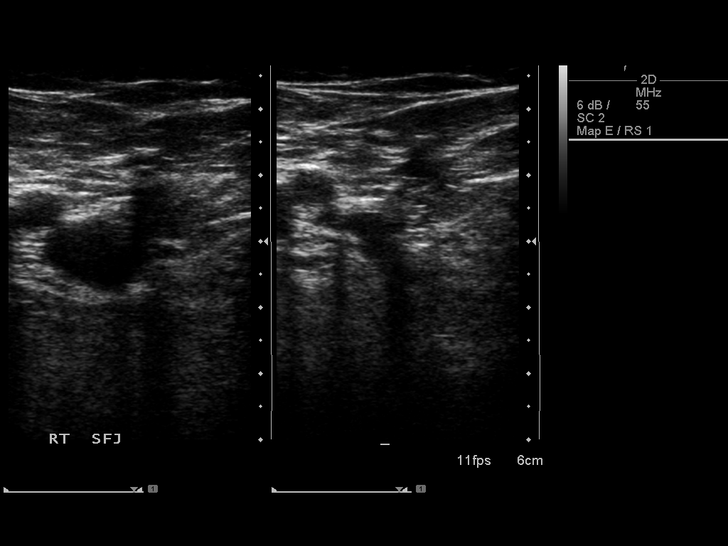
[im 38/42]
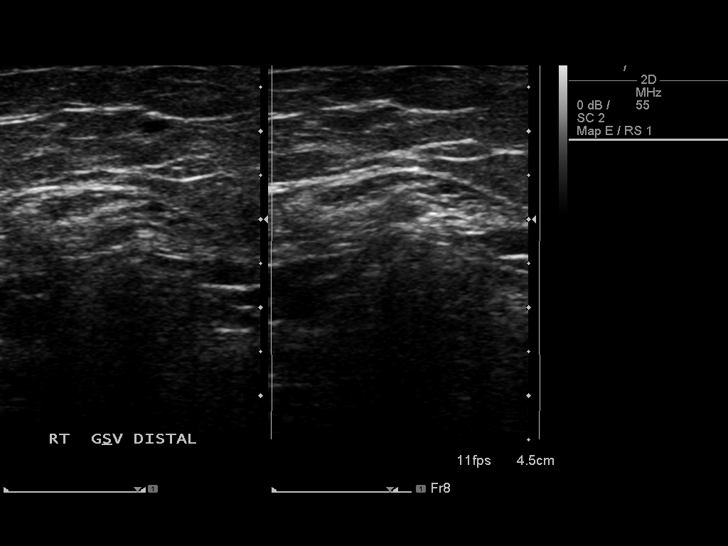
[im 42/42]
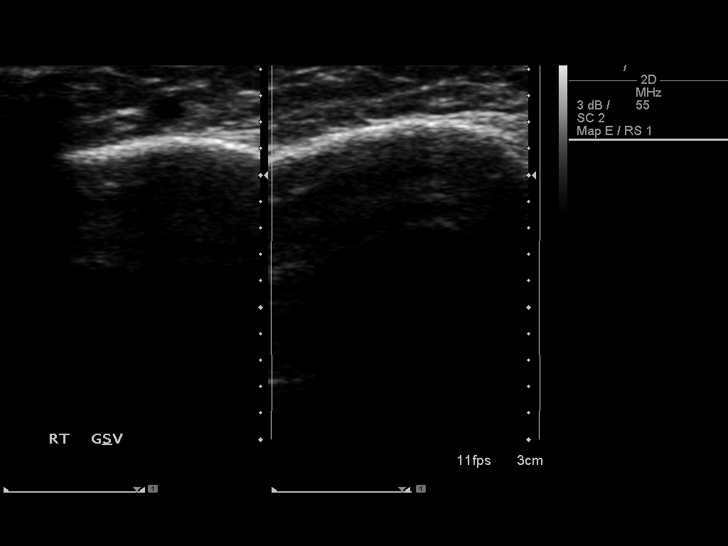

[13 of 24 positions shown; findings below may reference images not displayed]

FINDINGS: Contralateral Common Femoral Vein: Respiratory phasicity is normal
and symmetric with the symptomatic side. No evidence of thrombus.
Normal compressibility.

Common Femoral Vein: No evidence of thrombus. Normal
compressibility, respiratory phasicity and response to augmentation.

Saphenofemoral Junction: No evidence of thrombus. Normal
compressibility and flow on color Doppler imaging.

Profunda Femoral Vein: No evidence of thrombus. Normal
compressibility and flow on color Doppler imaging.

Femoral Vein: No evidence of thrombus. Normal compressibility,
respiratory phasicity and response to augmentation.

Popliteal Vein: No evidence of thrombus. Normal compressibility,
respiratory phasicity and response to augmentation.

Calf Veins: No evidence of thrombus. Normal compressibility and flow
on color Doppler imaging.

Superficial Great Saphenous Vein: No evidence of thrombus. Normal
compressibility and flow on color Doppler imaging.

Venous Reflux:  None.

Other Findings: Focal complex fluid collection is identified in the
medial calf measuring approximately 3.8 x 1.8 x 4.2 cm. This has the
appearance of a focal hematoma.
IMPRESSION: No evidence of right lower extremity deep venous thrombosis. Focal
complex fluid collection in the medial calf likely represents focal
hematoma.

## 2015-03-08 ENCOUNTER — Telehealth: Payer: Self-pay | Admitting: Neurology

## 2015-03-08 NOTE — Telephone Encounter (Signed)
Patient called stating she hasn't had a headache in 3-4 weeks and wants to be stop taking some of the headache meds. She is completely out of tizanidine since last night. She has a ruptured ear drum and bacterial infection in the ear so she is having some hearing difficulty. She is taking doxycycline 100mg  for ear. Please call and advise. Patient can be reached [phone].

## 2015-03-08 NOTE — Telephone Encounter (Signed)
Patient called back and requested that when Dr. Jannifer Franklin calls her tomorrow she would like for him to call after 10:30 because she does not wake up until after that time.

## 2015-03-08 NOTE — Telephone Encounter (Signed)
I called patient. She claims that the headaches have gone beginning in early May. The patient is already off tizanidine, she will taper off of Topamax by 50 mg every week until off the medication. She will call back at that time, we may be a will to eliminate the imipramine.

## 2015-03-08 NOTE — Telephone Encounter (Signed)
I called the patient. She stated that she has been taking the tizanidine, topamax and tofranil as prescribed but has not had a headache for about a month. She would like to stop taking some of these meds, specifically the tizanidine (she has ran out and does not feel like she needs to refill it), and wants Dr. Jannifer Franklin' opinion. I advised her that the medications seem to be helping her migraines and to continue taking her medications as prescribed. I promised her I would check with Dr. Jannifer Franklin to see what his thoughts were on coming off the medications.

## 2015-03-28 ENCOUNTER — Telehealth: Payer: Self-pay | Admitting: Neurology

## 2015-03-28 NOTE — Telephone Encounter (Signed)
I called the patient. She would like an appointment before 7/7 with Dr. Jannifer Franklin. Appointment scheduled 7/5 at 12 PM. She stated she wanted to talk to him about coming off of some of her medications. She states that she wakes up every morning feeling dizzy and "hungover."  Her PCP suggested she may be taking too many pain medications. While on the phone the patient stated she just pulled a muscle in her back and that her back hurts her all the time. She asked about using ice. I suggested she use it for 20 minutes and then leave it off for 20 minutes.

## 2015-03-28 NOTE — Telephone Encounter (Signed)
Pt is calling to be seen sooner than July 7th but requests to see dr. Jannifer Franklin and not the PA. Pt requests to speak with Dr. Jannifer Franklin about her medications over the phone if she can not get an appointment sooner. Please call and advise. She can be reached at [phone]

## 2015-03-28 NOTE — Telephone Encounter (Signed)
I will discuss medication changes when I see her.

## 2015-04-04 ENCOUNTER — Ambulatory Visit (INDEPENDENT_AMBULATORY_CARE_PROVIDER_SITE_OTHER): Payer: Medicare Other | Admitting: Neurology

## 2015-04-04 ENCOUNTER — Encounter: Payer: Self-pay | Admitting: Neurology

## 2015-04-04 VITALS — BP 192/92 | HR 101 | Ht 67.0 in | Wt 192.2 lb

## 2015-04-04 DIAGNOSIS — R251 Tremor, unspecified: Secondary | ICD-10-CM

## 2015-04-04 DIAGNOSIS — G43001 Migraine without aura, not intractable, with status migrainosus: Secondary | ICD-10-CM

## 2015-04-04 NOTE — Progress Notes (Signed)
Reason for visit: Headache  Kristin Gilmore is an 64 y.o. female  History of present illness:  Kristin Gilmore is a 64 year old right-handed white female with a history of chronic daily headaches. The patient has done very well recently with the headaches, she indicates that in May 2016, her headache gradually improved. The patient is essentially without headache at this point. However, the patient continues to have chronic low back pain, with some radiation down the right leg to the knee. The patient is followed through a pain center in [facility]. The patient will be having injections in the back, and possibly a spinal stimulator. She has come off of the Topamax, and she feels much better with the early morning dizziness off of this medication. The patient remains on imipramine taking 75 mg at night, and she is on 300 mg of Seroquel at night for psychiatric issues. She takes Percocet on a fairly regular basis for her low back pain. She is taking 2 or 3 tablets daily.  Past Medical History  Diagnosis Date  . Migraine   . Depression   . Hypertension   . Dyslipidemia   . Tremor 04/13/2014  . Migraine without aura, without mention of intractable migraine without mention of status migrainosus 04/13/2014  . 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 Cholecystectomy    . Nasal sinus surgery      Family History  Problem Relation Age of Onset  . Cancer Father     stomach cancer  . Migraines Mother     Social history:  reports that she has never smoked. She has never used smokeless tobacco. She reports that she does not drink alcohol or use illicit drugs.    Allergies  Allergen Reactions  . Amoxicillin   . Biaxin [Clarithromycin]     Medications:  Prior to Admission medications   Medication Sig Start Date End Date Taking? Authorizing Provider  aspirin 81 MG chewable tablet Chew 81 mg by mouth daily.   Yes  Historical Provider, MD  atorvastatin (LIPITOR) 20 MG tablet Take 20 mg by mouth daily. 05/16/14  Yes Historical Provider, MD  calcium-vitamin D (OSCAL WITH D) 500-200 MG-UNIT per tablet Take 1 tablet by mouth daily.   Yes Historical Provider, MD  Cholecalciferol (VITAMIN D3) 2000 UNITS TABS Take 1 tablet by mouth daily.   Yes Historical Provider, MD  co-enzyme Q-10 50 MG capsule Take 50 mg by mouth daily.   Yes Historical Provider, MD  dicyclomine (BENTYL) 20 MG tablet Take 20 mg by mouth every 6 (six) hours as needed.    Yes Historical Provider, MD  glucosamine-chondroitin 500-400 MG tablet Take 1 tablet by mouth 3 (three) times daily.   Yes Historical Provider, MD  imipramine (TOFRANIL) 25 MG tablet 3 tablets at night Patient taking differently: Take 50 mg by mouth at bedtime. 3 tablets at night 01/25/15  Yes Kathrynn Ducking, MD  loratadine (CLARITIN) 10 MG tablet Take 10 mg by mouth daily.   Yes Historical Provider, MD  LORazepam (ATIVAN) 0.5 MG tablet Take 1 mg by mouth at bedtime as needed.  03/22/14  Yes Historical Provider, MD  Magnesium 250 MG TABS Take 1.5 tablets by mouth daily.   Yes Historical Provider, MD  Melatonin 3 MG TABS Take 3 mg by mouth daily.   Yes Historical Provider, MD  metoprolol (LOPRESSOR) 50 MG tablet Take 25 mg by mouth 2 (two) times daily.  03/28/14  Yes Historical Provider, MD  naproxen sodium (ANAPROX) 220 MG tablet Take 220 mg by mouth 2 (two) times daily with a meal.   Yes Historical Provider, MD  Omega 3 1000 MG CAPS Take 1 capsule by mouth daily.   Yes Historical Provider, MD  omeprazole (PRILOSEC) 20 MG capsule Take 20 mg by mouth daily.   Yes Historical Provider, MD  oxyCODONE-acetaminophen (ROXICET) 5-325 MG per tablet Take 1 tablet by mouth every 6 (six) hours as needed for severe pain. Must last 28 days. 02/09/15  Yes Kathrynn Ducking, MD  QUEtiapine (SEROQUEL) 100 MG tablet Take 100 mg by mouth at bedtime.   Yes Historical Provider, MD  Riboflavin (VITAMIN  B-2) 25 MG TABS Take 1 capsule by mouth daily.   Yes Historical Provider, MD  Turmeric 500 MG CAPS Take 2 capsules by mouth daily.   Yes Historical Provider, MD  [place] Name: Suprema Dophilus   Yes Historical Provider, MD  vitamin E 400 UNIT capsule Take 400 Units by mouth daily.   Yes Historical Provider, MD  SUMAtriptan (IMITREX) 100 MG tablet Take 1 tablet (100 mg total) by mouth 2 (two) times daily as needed for migraine or headache. Patient not taking: Reported on 04/04/2015 02/09/15   Kathrynn Ducking, MD  topiramate (TOPAMAX) 50 MG tablet take 3 tablets by mouth at bedtime Patient not taking: Reported on 04/04/2015 01/11/15   Kathrynn Ducking, MD    ROS:  Out of a complete 14 system review of symptoms, the patient complains only of the following symptoms, and all other reviewed systems are negative.  Activity change Hearing loss, ear pain Back pain, muscle cramps, walking difficulty Skin rash Dizziness Anxiety, depression  Blood pressure 192/92, pulse 101, height 5\' 7"  (1.702 m), weight 192 lb 3.2 oz (87.181 kg).  Physical Exam  General: The patient is alert and cooperative at the time of the examination. The patient is moderately obese.  Skin: No significant peripheral edema is noted.   Neurologic Exam  Mental status: The patient is alert and oriented x 3 at the time of the examination. The patient has apparent normal recent and remote memory, with an apparently normal attention span and concentration ability.   Cranial nerves: Facial symmetry is present. Speech is normal, no aphasia or dysarthria is noted. Extraocular movements are full. Visual fields are full.  Motor: The patient has good strength in all 4 extremities.  Sensory examination: Soft touch sensation is symmetric on the face, arms, and legs.  Coordination: The patient has good finger-nose-finger and heel-to-shin bilaterally.  Gait and station: The patient has a normal gait. Tandem gait is normal.  Romberg is negative. No drift is seen.  Reflexes: Deep tendon reflexes are symmetric.   Assessment/Plan:  1. Migraine headache, resolved  2. Chronic low back pain  The patient is doing relatively well with her headaches at this point, but her main issue is the chronic low back pain. The patient is followed by a pain center, she will be getting injections in the near future, possibly a spinal stimulator. She will follow-up through this office on an as-needed basis. She will contact me if new issues arise.  Jill Alexanders MD 04/04/2015 4:55 PM  [facility] [address] [place], [place] [address]  Phone [phone] Fax [phone]

## 2015-04-04 NOTE — Patient Instructions (Signed)

## 2015-04-06 ENCOUNTER — Ambulatory Visit: Payer: BC Managed Care – PPO | Admitting: Adult Health

## 2015-04-10 ENCOUNTER — Encounter (HOSPITAL_BASED_OUTPATIENT_CLINIC_OR_DEPARTMENT_OTHER): Payer: Self-pay

## 2015-04-10 ENCOUNTER — Emergency Department (HOSPITAL_BASED_OUTPATIENT_CLINIC_OR_DEPARTMENT_OTHER)
Admission: EM | Admit: 2015-04-10 | Discharge: 2015-04-10 | Disposition: A | Discharge status: Home / Self Care | Payer: Medicare Other | Attending: Emergency Medicine | Admitting: Emergency Medicine

## 2015-04-10 DIAGNOSIS — E785 Hyperlipidemia, unspecified: Secondary | ICD-10-CM | POA: Diagnosis not present

## 2015-04-10 DIAGNOSIS — R42 Dizziness and giddiness: Secondary | ICD-10-CM | POA: Diagnosis present

## 2015-04-10 DIAGNOSIS — I10 Essential (primary) hypertension: Secondary | ICD-10-CM | POA: Diagnosis not present

## 2015-04-10 DIAGNOSIS — Z86018 Personal history of other benign neoplasm: Secondary | ICD-10-CM | POA: Diagnosis not present

## 2015-04-10 DIAGNOSIS — R5381 Other malaise: Secondary | ICD-10-CM | POA: Insufficient documentation

## 2015-04-10 DIAGNOSIS — Z880 Allergy status to penicillin: Secondary | ICD-10-CM | POA: Insufficient documentation

## 2015-04-10 DIAGNOSIS — G43909 Migraine, unspecified, not intractable, without status migrainosus: Secondary | ICD-10-CM | POA: Diagnosis not present

## 2015-04-10 DIAGNOSIS — Z7982 Long term (current) use of aspirin: Secondary | ICD-10-CM | POA: Insufficient documentation

## 2015-04-10 DIAGNOSIS — Z79899 Other long term (current) drug therapy: Secondary | ICD-10-CM | POA: Diagnosis not present

## 2015-04-10 LAB — COMPREHENSIVE METABOLIC PANEL
ALT: 35 U/L (ref 14–54)
ANION GAP: 9 (ref 5–15)
AST: 30 U/L (ref 15–41)
Albumin: 4.4 g/dL (ref 3.5–5.0)
Alkaline Phosphatase: 74 U/L (ref 38–126)
BUN: 18 mg/dL (ref 6–20)
CO2: 29 mmol/L (ref 22–32)
Calcium: 9.4 mg/dL (ref 8.9–10.3)
Chloride: 100 mmol/L — ABNORMAL LOW (ref 101–111)
Creatinine, Ser: 0.85 mg/dL (ref 0.44–1.00)
GFR calc Af Amer: >60 mL/min (ref >60)
GFR calc non Af Amer: >60 mL/min (ref >60)
Glucose, Bld: 102 mg/dL — ABNORMAL HIGH (ref 65–99)
Potassium: 4.1 mmol/L (ref 3.5–5.1)
Sodium: 138 mmol/L (ref 135–145)
Total Bilirubin: 0.6 mg/dL (ref 0.3–1.2)
Total Protein: 6.7 g/dL (ref 6.5–8.1)

## 2015-04-10 LAB — URINALYSIS, ROUTINE W REFLEX MICROSCOPIC
Bilirubin Urine: NEGATIVE
GLUCOSE, UA: NEGATIVE mg/dL
HGB URINE DIPSTICK: NEGATIVE
Ketones, ur: NEGATIVE mg/dL
Leukocytes, UA: NEGATIVE
NITRITE: NEGATIVE
Protein, ur: NEGATIVE mg/dL
SPECIFIC GRAVITY, URINE: 1.016 (ref 1.005–1.030)
UROBILINOGEN UA: 0.2 mg/dL (ref 0.0–1.0)
pH: 6 (ref 5.0–8.0)

## 2015-04-10 LAB — CBC WITH DIFFERENTIAL/PLATELET
Basophils Absolute: 0 10*3/uL (ref 0.0–0.1)
Basophils Relative: 1 % (ref 0–1)
Eosinophils Absolute: 0.1 10*3/uL (ref 0.0–0.7)
Eosinophils Relative: 2 % (ref 0–5)
HCT: 42.8 % (ref 36.0–46.0)
Hemoglobin: 14 g/dL (ref 12.0–15.0)
LYMPHS ABS: 2.7 10*3/uL (ref 0.7–4.0)
Lymphocytes Relative: 41 % (ref 12–46)
MCH: 31 pg (ref 26.0–34.0)
MCHC: 32.7 g/dL (ref 30.0–36.0)
MCV: 94.7 fL (ref 78.0–100.0)
Monocytes Absolute: 0.6 10*3/uL (ref 0.1–1.0)
Monocytes Relative: 9 % (ref 3–12)
NEUTROS ABS: 3.2 10*3/uL (ref 1.7–7.7)
Neutrophils Relative %: 47 % (ref 43–77)
Platelets: 207 10*3/uL (ref 150–400)
RBC: 4.52 MIL/uL (ref 3.87–5.11)
RDW: 13.2 % (ref 11.5–15.5)
WBC: 6.7 10*3/uL (ref 4.0–10.5)

## 2015-04-10 LAB — MAGNESIUM: MAGNESIUM: 1.8 mg/dL (ref 1.7–2.4)

## 2015-04-10 NOTE — ED Notes (Signed)
Pt refused w/c to tx area-steady gait to tx room 7

## 2015-04-10 NOTE — ED Notes (Signed)
Pt appears anxious.

## 2015-04-10 NOTE — ED Notes (Signed)
MD at bedside. 

## 2015-04-10 NOTE — Discharge Instructions (Signed)

## 2015-04-10 NOTE — ED Provider Notes (Signed)
CSN: [id number]     Arrival date & time 04/10/15  1111 History   First MD Initiated Contact with Patient 04/10/15 1126     Chief Complaint  Patient presents with  . Dizziness     (Consider location/radiation/quality/duration/timing/severity/associated sxs/prior Treatment) HPI The patient reports every morning when she wakes up she doesn't feel well. She reports she feels kind of dizzy and a little disoriented. The symptoms lasted until about mid afternoon. They then resolved. Patient has a history of chronic headaches and migraines. She reports that the headaches are have not returned there better controlled on her medications right now. She has not had a fever or vomiting. She does report that she feels a little nauseated and unwell in the mornings. She reports she is tired of feeling this way every day. She denies any of her medications are new. Past Medical History  Diagnosis Date  . Migraine   . Depression   . Hypertension   . Dyslipidemia   . Tremor 04/13/2014  . Migraine without aura, without mention of intractable migraine without mention of status migrainosus 04/13/2014  . 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 Cholecystectomy    . Nasal sinus surgery     Family History  Problem Relation Age of Onset  . Cancer Father     stomach cancer  . Migraines Mother    History  Substance Use Topics  . Smoking status: Never Smoker   . Smokeless tobacco: Never Used  . Alcohol Use: No   OB History    No data available     Review of Systems 10 Systems reviewed and are negative for acute change except as noted in the HPI.    Allergies  Amoxicillin and Biaxin  Home Medications   Prior to Admission medications   Medication Sig Start Date End Date Taking? Authorizing Provider  aspirin 81 MG chewable tablet Chew 81 mg by mouth daily.    Historical Provider, MD  atorvastatin (LIPITOR) 20 MG tablet Take 20 mg by mouth daily.  05/16/14   Historical Provider, MD  calcium-vitamin D (OSCAL WITH D) 500-200 MG-UNIT per tablet Take 1 tablet by mouth daily.    Historical Provider, MD  Cholecalciferol (VITAMIN D3) 2000 UNITS TABS Take 1 tablet by mouth daily.    Historical Provider, MD  co-enzyme Q-10 50 MG capsule Take 50 mg by mouth daily.    Historical Provider, MD  dicyclomine (BENTYL) 20 MG tablet Take 20 mg by mouth every 6 (six) hours as needed.     Historical Provider, MD  glucosamine-chondroitin 500-400 MG tablet Take 1 tablet by mouth 3 (three) times daily.    Historical Provider, MD  imipramine (TOFRANIL) 25 MG tablet 3 tablets at night Patient taking differently: Take 50 mg by mouth at bedtime. 3 tablets at night 01/25/15   Kathrynn Ducking, MD  loratadine (CLARITIN) 10 MG tablet Take 10 mg by mouth daily.    Historical Provider, MD  LORazepam (ATIVAN) 0.5 MG tablet Take 1 mg by mouth at bedtime as needed.  03/22/14   Historical Provider, MD  Magnesium 250 MG TABS Take 1.5 tablets by mouth daily.    Historical Provider, MD  Melatonin 3 MG TABS Take 3 mg by mouth daily.    Historical Provider, MD  metoprolol (LOPRESSOR) 50 MG tablet Take 25 mg by mouth 2 (two) times daily.  03/28/14   Historical Provider, MD  naproxen sodium (ANAPROX) 220 MG tablet Take  220 mg by mouth 2 (two) times daily with a meal.    Historical Provider, MD  Omega 3 1000 MG CAPS Take 1 capsule by mouth daily.    Historical Provider, MD  omeprazole (PRILOSEC) 20 MG capsule Take 20 mg by mouth daily.    Historical Provider, MD  oxyCODONE-acetaminophen (ROXICET) 5-325 MG per tablet Take 1 tablet by mouth every 6 (six) hours as needed for severe pain. Must last 28 days. 02/09/15   Kathrynn Ducking, MD  QUEtiapine (SEROQUEL) 300 MG tablet Take 300 mg by mouth at bedtime.    Historical Provider, MD  Riboflavin (VITAMIN B-2) 25 MG TABS Take 1 capsule by mouth daily.    Historical Provider, MD  SUMAtriptan (IMITREX) 100 MG tablet Take 1 tablet (100 mg total)  by mouth 2 (two) times daily as needed for migraine or headache. Patient not taking: Reported on 04/04/2015 02/09/15   Kathrynn Ducking, MD  Turmeric 500 MG CAPS Take 2 capsules by mouth daily.    Historical Provider, MD  [place] Name: Suprema Dophilus    Historical Provider, MD  vitamin E 400 UNIT capsule Take 400 Units by mouth daily.    Historical Provider, MD   BP 181/84 mmHg  Pulse 80  Temp(Src) 98.4 F (36.9 C) (Oral)  Resp 20  Ht 5' 7.5" (1.715 m)  Wt 190 lb (86.183 kg)  BMI 29.30 kg/m2  SpO2 96% Physical Exam  Constitutional: She is oriented to person, place, and time. She appears well-developed and well-nourished.  HENT:  Head: Normocephalic and atraumatic.  Eyes: EOM are normal. Pupils are equal, round, and reactive to light.  Neck: Neck supple.  Cardiovascular: Normal rate, regular rhythm, normal heart sounds and intact distal pulses.   Pulmonary/Chest: Effort normal and breath sounds normal.  Abdominal: Soft. Bowel sounds are normal. She exhibits no distension. There is no tenderness.  Musculoskeletal: Normal range of motion. She exhibits no edema.  Neurological: She is alert and oriented to person, place, and time. She has normal strength. No cranial nerve deficit. She exhibits normal muscle tone. Coordination normal. GCS eye subscore is 4. GCS verbal subscore is 5. GCS motor subscore is 6.  Skin: Skin is warm, dry and intact.  Psychiatric:  Patient is very anxious.    ED Course  Procedures (including critical care time) Labs Review Labs Reviewed  COMPREHENSIVE METABOLIC PANEL - Abnormal; Notable for the following:    Chloride 100 (*)    Glucose, Bld 102 (*)    All other components within normal limits  CBC WITH DIFFERENTIAL/PLATELET  URINALYSIS, ROUTINE W REFLEX MICROSCOPIC (NOT AT [facility])  MAGNESIUM    Imaging Review No results found.   EKG Interpretation None      MDM   Final diagnoses:  Malaise  Episodic lightheadedness   Diagnostic  workup does not reveal acute etiology for the patient's symptoms. His EMR does indicate she's had significant recurrence of problems with migraines and dizziness. She does take several medications which could be the etiology. I suggest that she should discuss her Seroquel which she takes in the evening as well as her imipramine as possible causes for morning malaise and lightheadedness. As the patient has had a significant psychiatric history and these medications have helped her, I have advised she must discuss this with her family physician and her psychiatrist before making any changes. As the symptoms are limited to the morning and are generally a malaise-type of feeling, I did not feel that  other more serious etiology such as cardiac or pulmonary or infectious are the likely cause. Currently I feel she is safe to continue to pursue a further explanation of the symptoms with her primary providers who are aware of all of her underlying medical conditions and symptoms.    Charlesetta Shanks, MD 04/11/15 [phone]

## 2015-04-10 NOTE — ED Notes (Signed)
C/o dizziness x 1week-worse in the am when she wakes-"slight" HA-steady gait to traige

## 2015-04-21 ENCOUNTER — Telehealth: Payer: Self-pay | Admitting: Neurology

## 2015-04-21 MED ORDER — DEXAMETHASONE 2 MG PO TABS: ORAL_TABLET | ORAL | Status: DC

## 2015-04-21 NOTE — Telephone Encounter (Signed)
I called the patient. She had taken a percocet earlier this morning. I advised she take her Imitrex for her migraine. She agreed. She stated that she is taking a half tablet of her imipramine at night and a half tablet in the morning. I explained that Dr. Jannifer Franklin' order was for 3 of these tablets nightly. She requested to speak to Dr. Jannifer Franklin about her medications. It seems that her psychiatrist may have made a few changes/recommendations.

## 2015-04-21 NOTE — Telephone Encounter (Signed)
I called patient. The patient has had a headache over the last 2 days. She has become emotionally upset about something, and this has worsened her headaches. She will be placed on a Decadron taper, she may continue to take Imitrex if needed, so far this has not been helpful.

## 2015-04-21 NOTE — Telephone Encounter (Signed)
Pt called and states that she is having a Horrible migraine. And would like a call back from Dr. To talk about what might could be done. Migraine has lasted 3 days , feels dizzy and sick. No aura. Please call and advise [phone].

## 2015-04-25 ENCOUNTER — Telehealth: Payer: Self-pay | Admitting: Neurology

## 2015-04-25 MED ORDER — TOPIRAMATE 50 MG PO TABS: 150.0000 mg | ORAL_TABLET | Freq: Every day | ORAL | Status: DC

## 2015-04-25 NOTE — Telephone Encounter (Signed)
I called patient. She is feeling better after she took a Ambulance person. The headaches have come back, the Decadron did help some. I will have her get back on her Topamax working up to a 150 mg nighttime dose.

## 2015-04-25 NOTE — Telephone Encounter (Signed)
I called the patient. She has already taken the maximum amount of Imitrex for today. She is going to try a percocet now. She said the decadron taper really helped her migraine for a few days. She would like to talk to Dr. Jannifer Franklin about medication for her migraines.

## 2015-04-25 NOTE — Telephone Encounter (Signed)
Patient called stating she awoke with headache today, nausea, diarrhea. She has tried 2 imitrex tablets today, 2 aleve when she got up this morning and 1 imitrex tablet @ 9:45 am and 2nd @ 2:45. She states it is the worst headache she has had. Please call and advise. She can be reached at [phone].

## 2015-04-26 NOTE — Telephone Encounter (Signed)
I called the patient. Yesterday, 2 Imitrex and 1 Percocet was able to ease her pain. She just took the Imitrex 20 minutes ago. I advised she give it a little while longer to try to work. I also advised she could take the Percocet again at 4 PM (she last took it at 10 AM). She started Topamax again last night. I advised it would take some time to work up to the correct dose and build back up in her system. If she is still in pain in about a hour or so, she will call our office back.

## 2015-04-26 NOTE — Telephone Encounter (Signed)
Patient called stating she is not better today. She took imitrex and percocet at 10 am today and  another imitrex @ 1 pm.She states yesterday the headache got better in the afternoon and evening. She is able to eat. Has been in bed since eating breakfast this morning. Please call and advise. Patient can be reached at [phone] Her husband feels it will be more stress for her to try to come in.

## 2015-04-27 ENCOUNTER — Emergency Department (HOSPITAL_BASED_OUTPATIENT_CLINIC_OR_DEPARTMENT_OTHER)
Admission: EM | Admit: 2015-04-27 | Discharge: 2015-04-27 | Disposition: A | Discharge status: Home / Self Care | Payer: Medicare Other | Attending: Emergency Medicine | Admitting: Emergency Medicine

## 2015-04-27 ENCOUNTER — Encounter (HOSPITAL_BASED_OUTPATIENT_CLINIC_OR_DEPARTMENT_OTHER): Payer: Self-pay | Admitting: *Deleted

## 2015-04-27 DIAGNOSIS — E785 Hyperlipidemia, unspecified: Secondary | ICD-10-CM | POA: Insufficient documentation

## 2015-04-27 DIAGNOSIS — F329 Major depressive disorder, single episode, unspecified: Secondary | ICD-10-CM | POA: Insufficient documentation

## 2015-04-27 DIAGNOSIS — G43909 Migraine, unspecified, not intractable, without status migrainosus: Secondary | ICD-10-CM | POA: Diagnosis present

## 2015-04-27 DIAGNOSIS — I10 Essential (primary) hypertension: Secondary | ICD-10-CM | POA: Diagnosis not present

## 2015-04-27 DIAGNOSIS — Z79899 Other long term (current) drug therapy: Secondary | ICD-10-CM | POA: Diagnosis not present

## 2015-04-27 DIAGNOSIS — Z7982 Long term (current) use of aspirin: Secondary | ICD-10-CM | POA: Insufficient documentation

## 2015-04-27 DIAGNOSIS — G43811 Other migraine, intractable, with status migrainosus: Secondary | ICD-10-CM

## 2015-04-27 DIAGNOSIS — Z880 Allergy status to penicillin: Secondary | ICD-10-CM | POA: Insufficient documentation

## 2015-04-27 DIAGNOSIS — Z86018 Personal history of other benign neoplasm: Secondary | ICD-10-CM | POA: Diagnosis not present

## 2015-04-27 MED ORDER — HYDROMORPHONE HCL 1 MG/ML IJ SOLN
1.0000 mg | Freq: Once | INTRAMUSCULAR | Status: DC
  Filled 2015-04-27: qty 1

## 2015-04-27 MED ORDER — MORPHINE SULFATE 4 MG/ML IJ SOLN
4.0000 mg | Freq: Once | INTRAMUSCULAR | Status: AC
  Administered 2015-04-27: 4 mg via INTRAVENOUS
  Filled 2015-04-27: qty 1

## 2015-04-27 MED ORDER — SODIUM CHLORIDE 0.9 % IV BOLUS (SEPSIS)
1000.0000 mL | Freq: Once | INTRAVENOUS | Status: AC
  Administered 2015-04-27: 1000 mL via INTRAVENOUS

## 2015-04-27 MED ORDER — DIPHENHYDRAMINE HCL 50 MG/ML IJ SOLN
25.0000 mg | Freq: Once | INTRAMUSCULAR | Status: AC
  Administered 2015-04-27: 25 mg via INTRAVENOUS
  Filled 2015-04-27: qty 1

## 2015-04-27 MED ORDER — METOCLOPRAMIDE HCL 5 MG/ML IJ SOLN
10.0000 mg | Freq: Once | INTRAMUSCULAR | Status: AC
Start: 2015-04-27 — End: 2015-04-27
  Administered 2015-04-27: 10 mg via INTRAVENOUS
  Filled 2015-04-27: qty 2

## 2015-04-27 NOTE — ED Notes (Signed)
T.Carlota Raspberry, PA-C in room with patient now.

## 2015-04-27 NOTE — ED Notes (Signed)
Migraine headache x 4 days. Nausea.

## 2015-04-27 NOTE — ED Notes (Signed)
Went in to the patient's room due to Jenny Reichmann, [place] request. Pt requesting to go home. Pt had requested to leave while Jenny Reichmann, EMT was doing vital signs. This RN entered the room with the patients medication, which the patient adamantly refused. Pt sts "I don't want anything stronger than morphine." Pt then yelled at this RN "take this out, I am ready to go home." This RN removed the patients IV, placed coban on the site and the patient asked "why are you using that? Just put a bandaid on it." This RN politely asked if she would prefer me to put a bandaid on the site, pt sts "no, I'll just do it when I get home." Pt sts "i'm just ready to go, my headache isn't any better, yall aren't doing anything for me, that damn baby has been beside me crying for over an hour." This RN apologized, pt given discharge papers and stormed out of the room.

## 2015-04-27 NOTE — ED Notes (Signed)
While in the room checking pt's vital signs the pt was saying that she did not feel better so I notified the RN for this pt. Per RN the provider was notified and plaining on coming back in. I advised the pt of this. Then while still in the room the pt stated "I do not want to wait I'm tired of being here I want to go." so I notified the RN again of this.

## 2015-04-27 NOTE — Telephone Encounter (Signed)
I called the patient. The patient may have gone to the emergency room for the headache. If need be, I may be able to get her in tomorrow late morning for an injection. Nothing has really helped her headaches however.

## 2015-04-27 NOTE — Telephone Encounter (Signed)
Pt called and states that she still has a headache, 4 days now, feels that it is worse and she is nauseous. Feels dizzy as well, imetrix at 10am and 12 and a perocet 10am . Pt wants to know if she should come in. Please and advise

## 2015-04-27 NOTE — Telephone Encounter (Signed)
I called and spoke to Kristin Gilmore after consulting with Dr. Jannifer Franklin.  Kristin Gilmore is at level 10 headache with no relief from taking 2 imitrex and 1 percocet today.  (these helped her yesterday).  I relayed that did not have any availablity until Wedneday next (1200) 05-03-15.  I did not book appt.  Kristin Gilmore begging to have someone else see her for an injection today.  I told her that if her headache was so bad and no medication was helping that she should go to emergency room, she asked about urgent care.  I told either one.  She tried to get her husband on the phone, but he did not come, and had told her to just go back to bed.  She stated she would like for you to call her too.

## 2015-04-27 NOTE — Telephone Encounter (Signed)
Pt called and is requesting to talk to the nurse. Pt states she called earlier. Pt feels like she needs to be seen today. 2 Imitrex and 1 percocet.

## 2015-04-27 NOTE — ED Provider Notes (Signed)
CSN: [id number]     Arrival date & time 04/27/15  83 History   First MD Initiated Contact with Patient 04/27/15 1640     Chief Complaint  Patient presents with  . Migraine     (Consider location/radiation/quality/duration/timing/severity/associated sxs/prior Treatment) HPI    PCP: KAPLAN,KRISTEN, PA-C Blood pressure 188/79, pulse 79, temperature 98.2 F (36.8 C), temperature source Oral, height 5' 7.5" (1.715 m), weight 190 lb (86.183 kg), SpO2 100 %.  Kristin Gilmore is a 64 y.o.female with a significant PMH of migraine, depression, hypertension, dyslipidemia, tremor, migraine without aura, pituitary microadenoma presents to the ER with complaints of migraine headache.   The patient has chronic migraines. She reports recently having a migraine that lasted from May 2015 to May 2016. She sees a neurologist Dr. Jannifer Franklin with [facility] neurology. She called his office today and he told her to come to the emergency department but he will try to see her tomorrow if need be. The patient reports  Increased stress and tension and believes that that is the cause of her migraine. She reports having tons of scans and MRIs and they are all normal. She denies having any weakness, fevers, confusion, change of vision , nausea, vomiting, diarrhea or anything else associated with his headache. She is tried taking prescribed medication at home without any relief. The patient tells me that nothing has ever worked for her migraine. The husband reiterates that everything that the neurologist is ever tried to do for her migraine has never worked.  Patient  informs me that she's not getting any scans and does not want to be admitted.  The patient denies diaphoresis, fever, headache, weakness (general or focal), confusion, change of vision,  neck pain, dysphagia, aphagia, chest pain, shortness of breath,  back pain, abdominal pains, nausea, vomiting, diarrhea, lower extremity swelling, rash.   Past Medical History   Diagnosis Date  . Migraine   . Depression   . Hypertension   . Dyslipidemia   . Tremor 04/13/2014  . Migraine without aura, without mention of intractable migraine without mention of status migrainosus 04/13/2014  . 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 Cholecystectomy    . Nasal sinus surgery     Family History  Problem Relation Age of Onset  . Cancer Father     stomach cancer  . Migraines Mother    History  Substance Use Topics  . Smoking status: Never Smoker   . Smokeless tobacco: Never Used  . Alcohol Use: No   OB History    No data available     Review of Systems  10 Systems reviewed and are negative for acute change except as noted in the HPI.     Allergies  Amoxicillin and Biaxin  Home Medications   Prior to Admission medications   Medication Sig Start Date End Date Taking? Authorizing Provider  aspirin 81 MG chewable tablet Chew 81 mg by mouth daily.    Historical Provider, MD  atorvastatin (LIPITOR) 20 MG tablet Take 20 mg by mouth daily. 05/16/14   Historical Provider, MD  calcium-vitamin D (OSCAL WITH D) 500-200 MG-UNIT per tablet Take 1 tablet by mouth daily.    Historical Provider, MD  Cholecalciferol (VITAMIN D3) 2000 UNITS TABS Take 1 tablet by mouth daily.    Historical Provider, MD  co-enzyme Q-10 50 MG capsule Take 50 mg by mouth daily.    Historical Provider, MD  dicyclomine (BENTYL) 20 MG tablet Take  20 mg by mouth every 6 (six) hours as needed.     Historical Provider, MD  glucosamine-chondroitin 500-400 MG tablet Take 1 tablet by mouth 3 (three) times daily.    Historical Provider, MD  imipramine (TOFRANIL) 25 MG tablet 3 tablets at night Patient taking differently: Take 50 mg by mouth at bedtime. 3 tablets at night 01/25/15   Kathrynn Ducking, MD  loratadine (CLARITIN) 10 MG tablet Take 10 mg by mouth daily.    Historical Provider, MD  LORazepam (ATIVAN) 0.5 MG tablet Take 1 mg by mouth at  bedtime as needed.  03/22/14   Historical Provider, MD  Magnesium 250 MG TABS Take 1.5 tablets by mouth daily.    Historical Provider, MD  Melatonin 3 MG TABS Take 3 mg by mouth daily.    Historical Provider, MD  metoprolol (LOPRESSOR) 50 MG tablet Take 25 mg by mouth 2 (two) times daily.  03/28/14   Historical Provider, MD  naproxen sodium (ANAPROX) 220 MG tablet Take 220 mg by mouth 2 (two) times daily with a meal.    Historical Provider, MD  Omega 3 1000 MG CAPS Take 1 capsule by mouth daily.    Historical Provider, MD  omeprazole (PRILOSEC) 20 MG capsule Take 20 mg by mouth daily.    Historical Provider, MD  oxyCODONE-acetaminophen (ROXICET) 5-325 MG per tablet Take 1 tablet by mouth every 6 (six) hours as needed for severe pain. Must last 28 days. 02/09/15   Kathrynn Ducking, MD  QUEtiapine (SEROQUEL) 300 MG tablet Take 300 mg by mouth at bedtime.    Historical Provider, MD  Riboflavin (VITAMIN B-2) 25 MG TABS Take 1 capsule by mouth daily.    Historical Provider, MD  SUMAtriptan (IMITREX) 100 MG tablet Take 1 tablet (100 mg total) by mouth 2 (two) times daily as needed for migraine or headache. Patient not taking: Reported on 04/04/2015 02/09/15   Kathrynn Ducking, MD  topiramate (TOPAMAX) 50 MG tablet Take 3 tablets (150 mg total) by mouth at bedtime. 04/25/15   Kathrynn Ducking, MD  Turmeric 500 MG CAPS Take 2 capsules by mouth daily.    Historical Provider, MD  [place] Name: Suprema Dophilus    Historical Provider, MD  vitamin E 400 UNIT capsule Take 400 Units by mouth daily.    Historical Provider, MD   BP 156/73 mmHg  Pulse 81  Temp(Src) 98.8 F (37.1 C) (Oral)  Resp 18  Ht 5' 7.5" (1.715 m)  Wt 190 lb (86.183 kg)  BMI 29.30 kg/m2  SpO2 98% Physical Exam  Constitutional: She appears well-developed and well-nourished. No distress.  HENT:  Head: Normocephalic and atraumatic.  Eyes: Pupils are equal, round, and reactive to light.  Neck: Normal range of motion. Neck supple.   Cardiovascular: Normal rate and regular rhythm.   Pulmonary/Chest: Effort normal.  Abdominal: Soft.  Neurological: She is alert.  Cranial nerves II-VIII and X-XII evaluated and show no deficits. Pt alert and oriented x 3 Upper and lower extremity strength is symmetrical and physiologic Normal muscular tone No facial droop Coordination intact, no limb ataxia, Rapid alternating movements normal No pronator drift  Skin: Skin is warm and dry.  Nursing note and vitals reviewed.   ED Course  Procedures (including critical care time) Labs Review Labs Reviewed - No data to display  Imaging Review No results found.   EKG Interpretation None      MDM   Final diagnoses:  Other migraine with  status migrainosus, intractable   Medications  HYDROmorphone (DILAUDID) injection 1 mg (not administered)  sodium chloride 0.9 % bolus 1,000 mL (1,000 mLs Intravenous New Bag/Given 04/27/15 1739)  metoCLOPramide (REGLAN) injection 10 mg (10 mg Intravenous Given 04/27/15 1755)  diphenhydrAMINE (BENADRYL) injection 25 mg (25 mg Intravenous Given 04/27/15 1739)  morphine 4 MG/ML injection 4 mg (4 mg Intravenous Given 04/27/15 1755)     after the Benadryl, saline, Reglan and morphine patient reports no treatment of her pain.  Dilaudid was ordered but the patient decline- she says that she prefers to go home and get into the swimming pool since that often helps. The patient is upset because a baby in the department is crying and that we havent helped her pain. Her husband tells me not to feel bad because nothing ever works. Patient tells me she is now going to go get dinner because she is hungry. I told pt to call Dr. Jannifer Franklin tomorrow to be seen. Case discussed with Dr. Aline Brochure prior to discharge.  64 y.o.Gerda Yin Bougie's evaluation in the Emergency Department is complete. It has been determined that no acute conditions requiring further emergency intervention are present at this time. The  patient/guardian have been advised of the diagnosis and plan. We have discussed signs and symptoms that warrant return to the ED, such as changes or worsening in symptoms.  Vital signs are stable at discharge. Filed Vitals:   04/27/15 1853  BP: 156/73  Pulse: 81  Temp: 98.8 F (37.1 C)  Resp: 18    Patient/guardian has voiced understanding and agreed to follow-up with the PCP or specialist.     Delos Haring, PA-C 04/27/15 1922  Pamella Pert, MD 04/28/15 [phone]

## 2015-04-27 NOTE — Discharge Instructions (Signed)

## 2015-04-27 NOTE — ED Notes (Signed)
Pt states she has had 2 percocet (1 at 10am and 1 at 2pm) and 2 imitrex at 10am and 12pm today with no relief.  Pt describes headache as "throbbing" and states that it hurts across her forehead wrapping around temporal lobes to upper back skull.  Pt has had headche for 4 days.  + nausea, denies light sensitivity.

## 2015-04-28 ENCOUNTER — Telehealth: Payer: Self-pay | Admitting: Neurology

## 2015-04-28 NOTE — Telephone Encounter (Signed)
I called the patient and explained that unfortunately, Dr. Jannifer Franklin' schedule is full today. I explained that he would call her in just a little while to discuss a plan.

## 2015-04-28 NOTE — Telephone Encounter (Signed)
Please call patient, she was in ER yesterday pm and is really sick today with same migraine

## 2015-04-28 NOTE — Telephone Encounter (Signed)
I called the patient. I left a message, I will call back later. 

## 2015-04-28 NOTE — Telephone Encounter (Signed)
Pt has called for 2nd time today requesting to come in for injection today, please call and advise

## 2015-04-28 NOTE — Telephone Encounter (Signed)
Please see phone note from 7/26.

## 2015-04-29 NOTE — Telephone Encounter (Signed)
I called again and left a message. I will call back later.

## 2015-05-01 ENCOUNTER — Encounter: Payer: Self-pay | Admitting: Neurology

## 2015-05-01 ENCOUNTER — Ambulatory Visit (INDEPENDENT_AMBULATORY_CARE_PROVIDER_SITE_OTHER): Payer: Medicare Other | Admitting: Neurology

## 2015-05-01 VITALS — BP 155/93 | HR 97

## 2015-05-01 DIAGNOSIS — G43001 Migraine without aura, not intractable, with status migrainosus: Secondary | ICD-10-CM

## 2015-05-01 MED ORDER — DEXAMETHASONE 2 MG PO TABS: ORAL_TABLET | ORAL | Status: DC

## 2015-05-01 MED ORDER — OXYCODONE-ACETAMINOPHEN 5-325 MG PO TABS: 1.0000 | ORAL_TABLET | Freq: Four times a day (QID) | ORAL | Status: DC | PRN

## 2015-05-01 NOTE — Procedures (Signed)
   History: Kristin Gilmore is a 64 year old patient with a history of intractable migraine headaches. The patient had been doing very well with her headaches in May and June, but the headaches came back with severe episodes 2 weeks ago. The patient indicates that it occurred the day after her husband was given the diagnosis of Parkinson's disease. She has been under increased stress with this. The headaches are mainly bifrontal and temporal in nature. The episodes are associated with nausea vomiting, lasting all day long, and are daily in nature. The patient comes in for a bupivacaine injection protocol for the headache.    Bupivicaine injection protocol for headache  Bupivacaine 0.5% was injected on the scalp bilaterally at several locations:  -On the occipital area of the head, 3 injections each side, 1 cc per injection at the midpoint between the mastoid process and the occipital protuberance. 2 other injections were done one finger breadth from the initial injection, one at a 10 o'clock position and the other at a 2 o'clock position.  -2 injections of 0.5 cc were done in the temporal regions, 2 fingerbreadths above the tragus of the ear, with the second injection one fingerbreadth posteriorly to the first.  -2 injections were done on the brow, 1 in the medial brow and one over the supraorbital nerve notch, with 0.1 cc for each injection  -1 injection each side of 0.5 cc was done anterior to the tragus of the ear for a trigeminal ganglion injection  The patient tolerated the injections well, no complications of the procedure were noted. Injections were made with a 27-gauge needle.  [place] number is [id number].  Lot number is [id number]. Expriation date is 11/28/2016.

## 2015-05-01 NOTE — Telephone Encounter (Signed)
Pt called again regarding ongoing migraine x 5 days. Was at the pool when Dr Jannifer Franklin called as sometimes that helps her migraines. Please call pt as she would like to be seen today

## 2015-05-01 NOTE — Progress Notes (Signed)
Kristin Gilmore is a 64 year old patient with a history of intractable migraine headaches. The patient has had increased headache over the last 2 weeks. She comes in for a bupivacaine nerve block protocol for migraine.  The patient will be given a 3 day taper of dexamethasone. She was given a prescription for her Percocet, given a refill.  She is to contact our office if the headaches continue, she will be brought in for an IV treatment for her headache.

## 2015-05-01 NOTE — Telephone Encounter (Signed)
I called the patient. She will come in today at 4 PM for a nerve block.

## 2015-05-02 NOTE — Telephone Encounter (Signed)
Pt is calling back after being seen yesterday for migraine. Pt states Dr Jannifer Franklin told her to come in early this afternoon if continues to have headache. She said he wants to do liquid drip into her arm. She states she has been awake since 2am this morning. She is continuing to have migraine.

## 2015-05-02 NOTE — Telephone Encounter (Signed)
As discussed with the patient previously, I will have her come in for a Depacon injection of 1 g, with 30 mg of IV Toradol.

## 2015-05-02 NOTE — Telephone Encounter (Signed)
I called the patient. She will come in for the IV injections at 2 PM.

## 2015-05-03 ENCOUNTER — Telehealth: Payer: Self-pay | Admitting: *Deleted

## 2015-05-03 NOTE — Telephone Encounter (Signed)
Records faxed to [facility] on 05/03/15.

## 2015-05-26 ENCOUNTER — Other Ambulatory Visit: Payer: Self-pay | Admitting: Neurology

## 2015-05-30 ENCOUNTER — Telehealth: Payer: Self-pay | Admitting: Neurology

## 2015-05-30 NOTE — Telephone Encounter (Signed)
Pt called and states that she passed out a couple of saturdays ago due to from pain from headache, she states that she is very dizzy and has been for 2-3 weeks.  She states that her medication for dizziness is not helping. She going to a [employer], Dr. Collene Mares today 1:45p  Please call and advise [phone]

## 2015-05-30 NOTE — Telephone Encounter (Signed)
I called the patient. She stated that she passed out 8/6 from the pain of her migraine. She has been dizzy for a few weeks and has taken Meclizine. The meclizine is not helping her dizziness. She is still having migraines. She goes 9/14 for a shot for her migraine from Dr. Catheryn Bacon at the pain center. I advised the patient I would let Dr. Jannifer Franklin know about the dizziness, as the pain center is controlling her pain.

## 2015-05-30 NOTE — Telephone Encounter (Signed)
I called the patient. She indicates that she has dizziness with the severe headaches. I'm not sure that taking meclizine is likely to help this. Stop the medication. She may come in for another shot if the headache is severe, the patient will let me know. She indicates that she passed out with discomfort in the stomach and with severe headache, the stomach issues have been addressed.

## 2015-05-31 ENCOUNTER — Encounter (HOSPITAL_BASED_OUTPATIENT_CLINIC_OR_DEPARTMENT_OTHER): Payer: Self-pay | Admitting: Emergency Medicine

## 2015-05-31 ENCOUNTER — Emergency Department (HOSPITAL_BASED_OUTPATIENT_CLINIC_OR_DEPARTMENT_OTHER): Payer: Medicare Other

## 2015-05-31 ENCOUNTER — Emergency Department (HOSPITAL_BASED_OUTPATIENT_CLINIC_OR_DEPARTMENT_OTHER)
Admission: EM | Admit: 2015-05-31 | Discharge: 2015-05-31 | Disposition: A | Discharge status: Home / Self Care | Payer: Medicare Other | Attending: Emergency Medicine | Admitting: Emergency Medicine

## 2015-05-31 DIAGNOSIS — Z880 Allergy status to penicillin: Secondary | ICD-10-CM | POA: Diagnosis not present

## 2015-05-31 DIAGNOSIS — G43009 Migraine without aura, not intractable, without status migrainosus: Secondary | ICD-10-CM | POA: Diagnosis not present

## 2015-05-31 DIAGNOSIS — R1084 Generalized abdominal pain: Secondary | ICD-10-CM | POA: Diagnosis not present

## 2015-05-31 DIAGNOSIS — I10 Essential (primary) hypertension: Secondary | ICD-10-CM | POA: Insufficient documentation

## 2015-05-31 DIAGNOSIS — R197 Diarrhea, unspecified: Secondary | ICD-10-CM

## 2015-05-31 DIAGNOSIS — Z86018 Personal history of other benign neoplasm: Secondary | ICD-10-CM | POA: Diagnosis not present

## 2015-05-31 DIAGNOSIS — Z7982 Long term (current) use of aspirin: Secondary | ICD-10-CM | POA: Diagnosis not present

## 2015-05-31 DIAGNOSIS — Z79899 Other long term (current) drug therapy: Secondary | ICD-10-CM | POA: Diagnosis not present

## 2015-05-31 DIAGNOSIS — F329 Major depressive disorder, single episode, unspecified: Secondary | ICD-10-CM | POA: Insufficient documentation

## 2015-05-31 DIAGNOSIS — E785 Hyperlipidemia, unspecified: Secondary | ICD-10-CM | POA: Diagnosis not present

## 2015-05-31 DIAGNOSIS — R109 Unspecified abdominal pain: Secondary | ICD-10-CM | POA: Diagnosis present

## 2015-05-31 LAB — COMPREHENSIVE METABOLIC PANEL
ALT: 51 U/L (ref 14–54)
AST: 41 U/L (ref 15–41)
Albumin: 4.4 g/dL (ref 3.5–5.0)
Alkaline Phosphatase: 75 U/L (ref 38–126)
Anion gap: 10 (ref 5–15)
BUN: 19 mg/dL (ref 6–20)
CO2: 25 mmol/L (ref 22–32)
CREATININE: 1.08 mg/dL — AB (ref 0.44–1.00)
Calcium: 9.2 mg/dL (ref 8.9–10.3)
Chloride: 103 mmol/L (ref 101–111)
GFR calc Af Amer: >60 mL/min (ref >60)
GFR calc non Af Amer: 53 mL/min — ABNORMAL LOW (ref >60)
Glucose, Bld: 109 mg/dL — ABNORMAL HIGH (ref 65–99)
Potassium: 3.8 mmol/L (ref 3.5–5.1)
SODIUM: 138 mmol/L (ref 135–145)
Total Bilirubin: 0.5 mg/dL (ref 0.3–1.2)
Total Protein: 6.9 g/dL (ref 6.5–8.1)

## 2015-05-31 LAB — CBC
HCT: 42.7 % (ref 36.0–46.0)
Hemoglobin: 14.2 g/dL (ref 12.0–15.0)
MCH: 30.8 pg (ref 26.0–34.0)
MCHC: 33.3 g/dL (ref 30.0–36.0)
MCV: 92.6 fL (ref 78.0–100.0)
PLATELETS: 222 10*3/uL (ref 150–400)
RBC: 4.61 MIL/uL (ref 3.87–5.11)
RDW: 13.4 % (ref 11.5–15.5)
WBC: 7 10*3/uL (ref 4.0–10.5)

## 2015-05-31 LAB — URINALYSIS, ROUTINE W REFLEX MICROSCOPIC
BILIRUBIN URINE: NEGATIVE
GLUCOSE, UA: NEGATIVE mg/dL
HGB URINE DIPSTICK: NEGATIVE
KETONES UR: NEGATIVE mg/dL
Leukocytes, UA: NEGATIVE
Nitrite: NEGATIVE
PH: 5.5 (ref 5.0–8.0)
PROTEIN: NEGATIVE mg/dL
Specific Gravity, Urine: 1.01 (ref 1.005–1.030)
Urobilinogen, UA: 0.2 mg/dL (ref 0.0–1.0)

## 2015-05-31 LAB — LIPASE, BLOOD: Lipase: 19 U/L — ABNORMAL LOW (ref 22–51)

## 2015-05-31 IMAGING — CT CT ABD-PELV W/ CM
1 of 3 series · 14 of 32 positions shown, 19 images · IV contrast (APPLIED)
Comparison: 05/30/2004

CLINICAL DATA: Pain across the abdomen for 2-3 weeks.

EXAM:
CT ABDOMEN AND PELVIS WITH CONTRAST
TECHNIQUE: Multidetector CT imaging of the abdomen and pelvis was performed
using the standard protocol following bolus administration of
intravenous contrast.
CONTRAST:  25mL OMNIPAQUE IOHEXOL 300 MG/ML SOLN, 100mL OMNIPAQUE
IOHEXOL 300 MG/ML SOLN

[Series 2: abd/pelvis 5.0 b31f · axial · 0.87mm/px · z∈[-493,-103]mm · 14 of 88 slices shown, 19 images]
[im 5/88  soft-tissue]
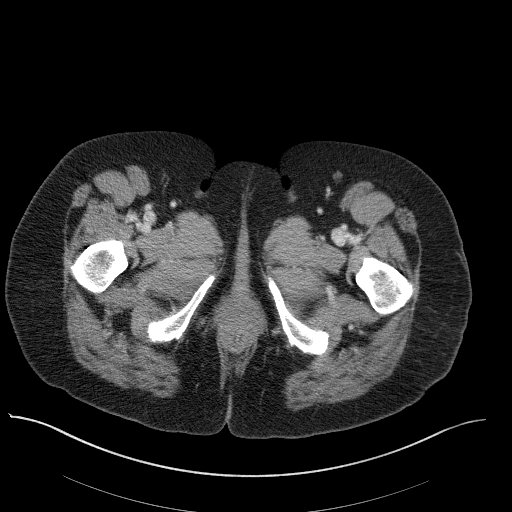
[im 5/88  bone]
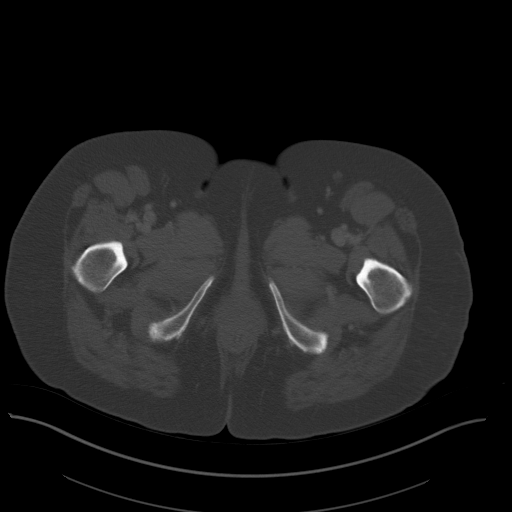
[im 10/88  soft-tissue]
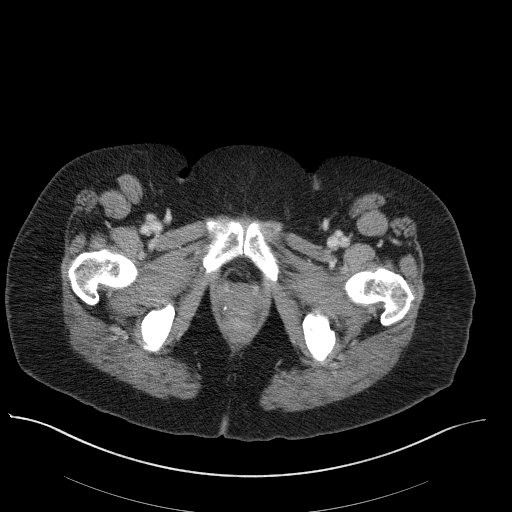
[im 20/88  soft-tissue]
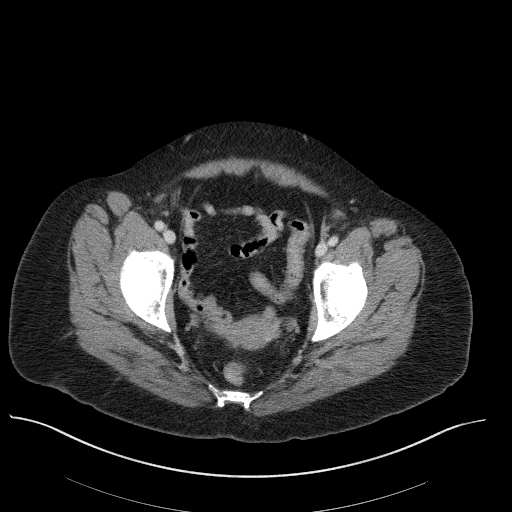
[im 25/88  soft-tissue]
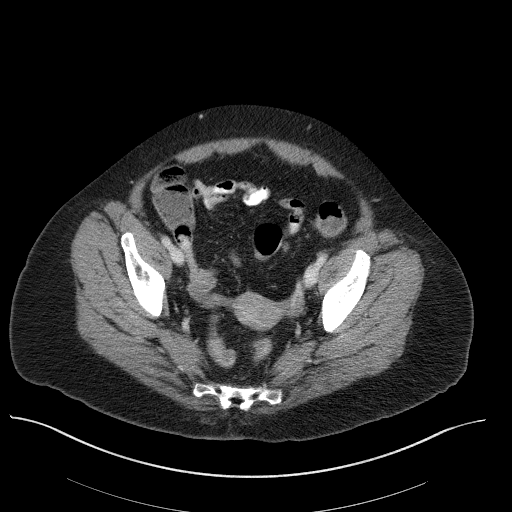
[im 30/88  soft-tissue]
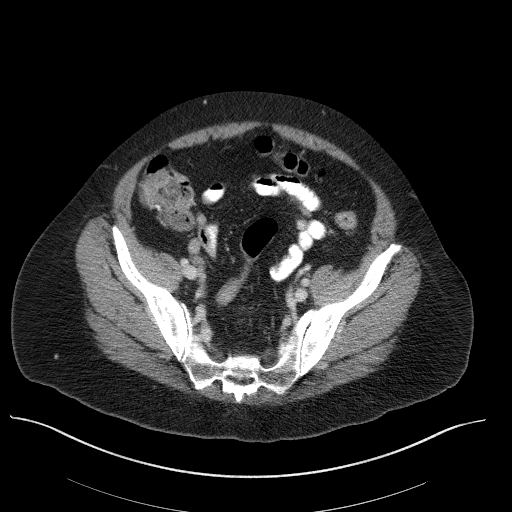
[im 39/88  soft-tissue]
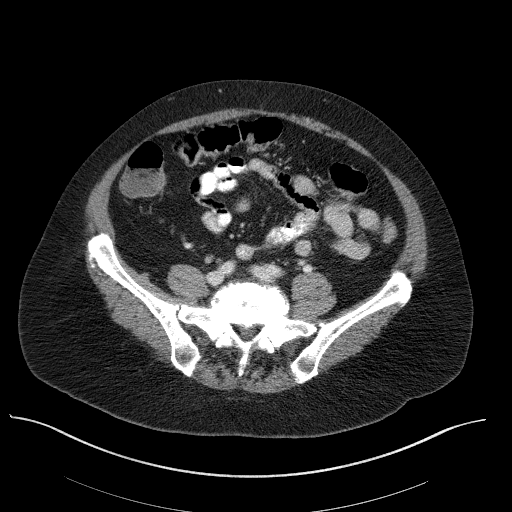
[im 44/88  soft-tissue]
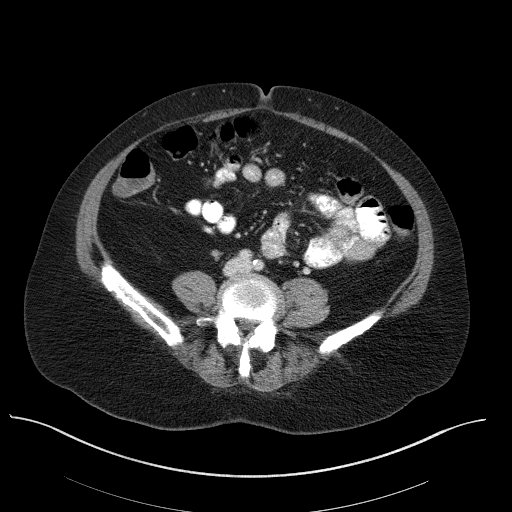
[im 49/88  soft-tissue]
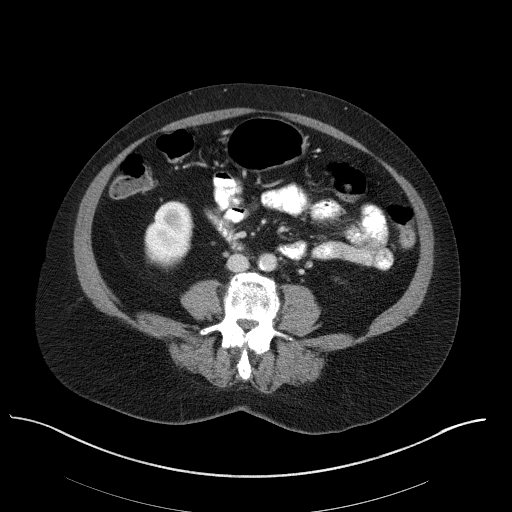
[im 59/88  soft-tissue]
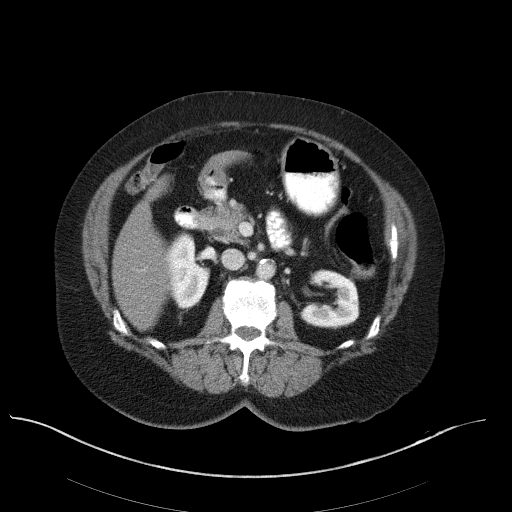
[im 59/88  bone]
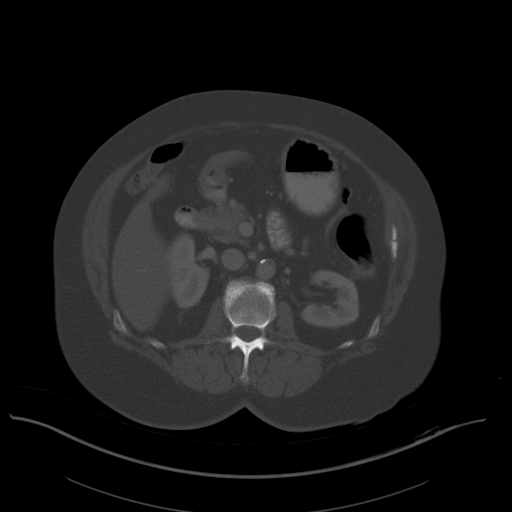
[im 63/88  soft-tissue]
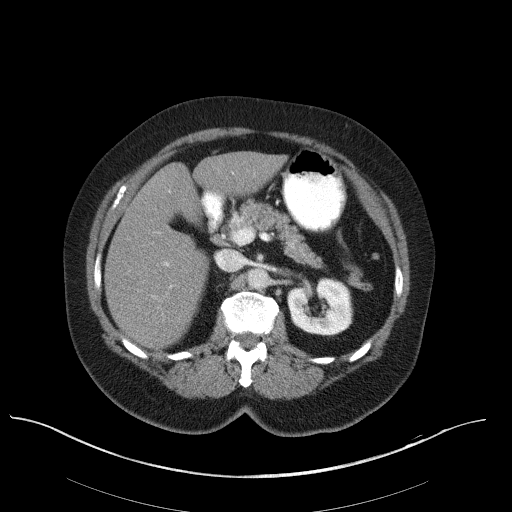
[im 68/88  soft-tissue]
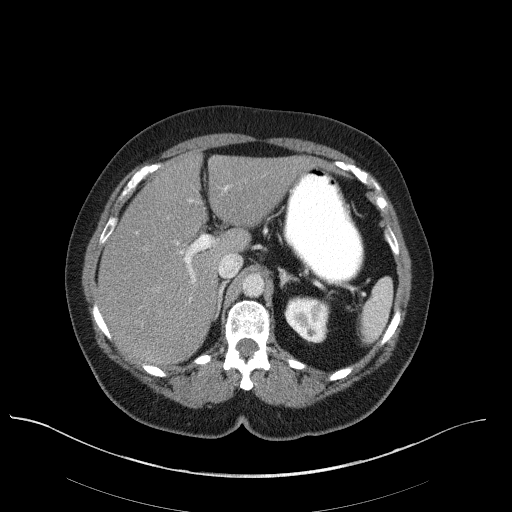
[im 68/88  lung]
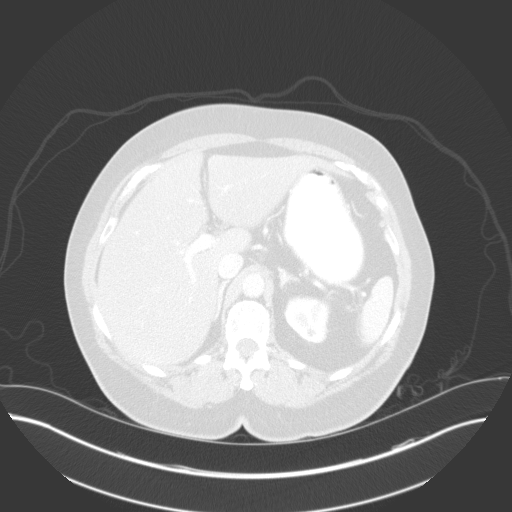
[im 73/88  lung]
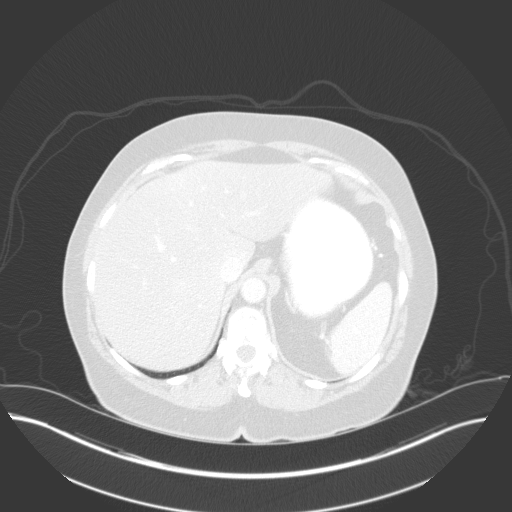
[im 78/88  soft-tissue]
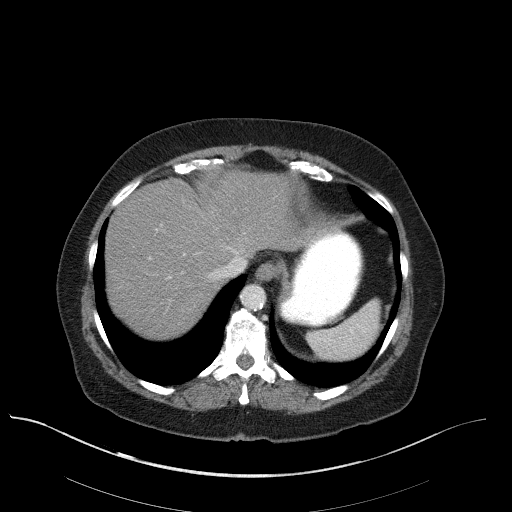
[im 78/88  lung]
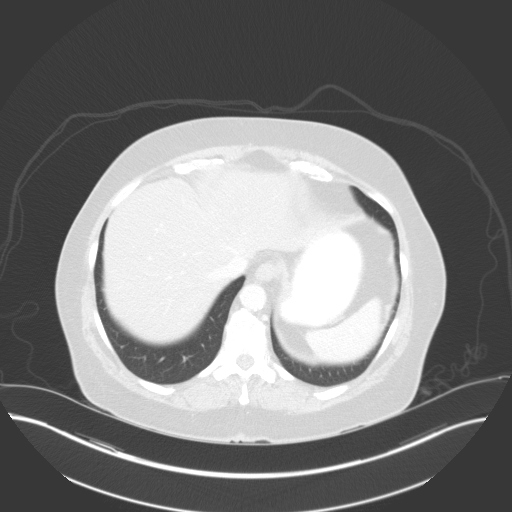
[im 83/88  soft-tissue]
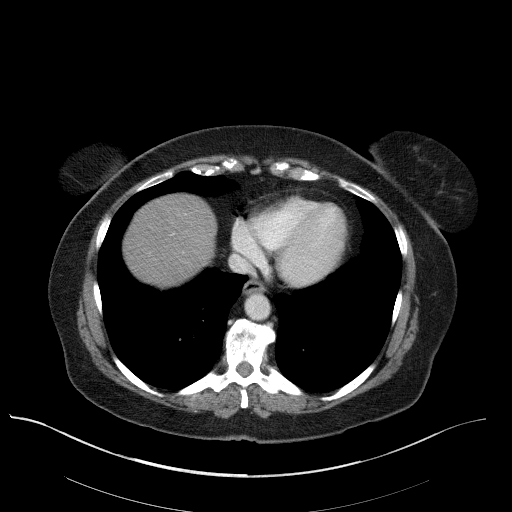
[im 83/88  lung]
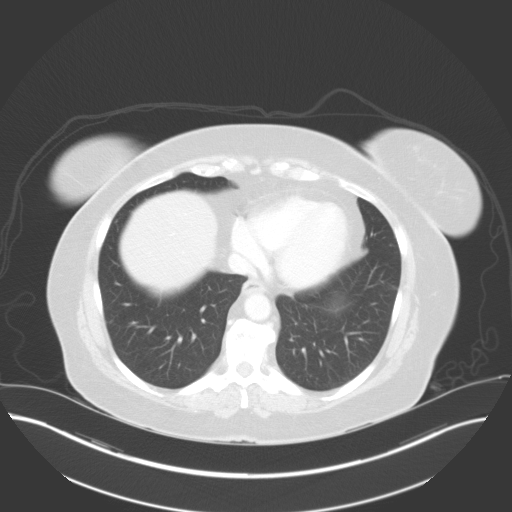

[14 of 32 positions shown; findings below may reference images not displayed]

FINDINGS: Lower chest: Punctate calcification in the left lower lobe likely
reflecting sequela prior granulomatous disease. Lung bases are
otherwise clear. Normal heart size.

Hepatobiliary: Diffuse hepatic low attenuation as can be seen with
hepatic steatosis. Prior cholecystectomy.

Pancreas: Normal.

Spleen: Normal.

Adrenals/Urinary Tract: Normal adrenal glands. Normal kidneys. No
nephrolithiasis or obstructive uropathy. Normal decompressed
bladder.

Stomach/Bowel: No bowel wall thickening or dilatation. Normal
caliber appendix without periappendiceal inflammatory changes. No
pneumatosis, pneumoperitoneum or portal venous gas. No abdominal or
pelvic free fluid.

Vascular/Lymphatic: Normal caliber abdominal aorta with
atherosclerosis. No abdominal or pelvic lymphadenopathy.

Reproductive: Normal uterus.  No adnexal mass.

Other: No fluid collection or hematoma.

Musculoskeletal: No acute osseous abnormality. No lytic or sclerotic
osseous lesion. Degenerative disc disease with disc height loss at
L5-S1 with bilateral facet arthropathy and foraminal narrowing.
IMPRESSION: 1. No acute abdominal or pelvic pathology.
2. Hepatic steatosis.

## 2015-05-31 MED ORDER — ONDANSETRON HCL 4 MG/2ML IJ SOLN
4.0000 mg | Freq: Once | INTRAMUSCULAR | Status: AC
  Administered 2015-05-31: 4 mg via INTRAVENOUS
  Filled 2015-05-31: qty 2

## 2015-05-31 MED ORDER — SODIUM CHLORIDE 0.9 % IV SOLN
INTRAVENOUS | Status: DC
  Administered 2015-05-31: 18:00:00 via INTRAVENOUS

## 2015-05-31 MED ORDER — IOHEXOL 300 MG/ML  SOLN
100.0000 mL | Freq: Once | INTRAMUSCULAR | Status: AC | PRN
  Administered 2015-05-31: 100 mL via INTRAVENOUS

## 2015-05-31 MED ORDER — IOHEXOL 300 MG/ML  SOLN
25.0000 mL | Freq: Once | INTRAMUSCULAR | Status: AC | PRN
  Administered 2015-05-31: 25 mL via ORAL

## 2015-05-31 MED ORDER — FENTANYL CITRATE (PF) 100 MCG/2ML IJ SOLN
50.0000 ug | Freq: Once | INTRAMUSCULAR | Status: AC
  Administered 2015-05-31: 50 ug via INTRAVENOUS
  Filled 2015-05-31: qty 2

## 2015-05-31 MED ORDER — SODIUM CHLORIDE 0.9 % IV BOLUS (SEPSIS)
500.0000 mL | Freq: Once | INTRAVENOUS | Status: AC
  Administered 2015-05-31: 500 mL via INTRAVENOUS

## 2015-05-31 NOTE — ED Notes (Signed)
Pt in c/o diffuse intermittent abdominal pain x 2-3 weeks, associated with diarrhea but no vomiting or nausea. Was seen by her gastroenterologist and was sent here for a CT scan.

## 2015-05-31 NOTE — ED Notes (Signed)
Patient transported to x-ray. ?

## 2015-05-31 NOTE — Discharge Instructions (Signed)
CT scan lab workup without significant findings. Continue treatment as recommended by your GI doctor. Make an appointment and follow-up. Return for any new or worse symptoms.

## 2015-05-31 NOTE — ED Provider Notes (Signed)
CSN: [id number]     Arrival date & time 05/31/15  1657 History   First MD Initiated Contact with Patient 05/31/15 1713     Chief Complaint  Patient presents with  . Abdominal Pain     (Consider location/radiation/quality/duration/timing/severity/associated sxs/prior Treatment) Patient is a 64 y.o. female presenting with abdominal pain. The history is provided by the patient.  Abdominal Pain Associated symptoms: diarrhea   Associated symptoms: no chest pain, no dysuria, no fever, no nausea, no shortness of breath and no vomiting    patient with a 2-3 week history of intermittent abdominal pains associated with diarrhea no vomiting no nausea. Patient followed by GI medicine Dr. Collene Mares. Seen by them recently CT scan planned and was sent here to have it done today since symptoms get worse. Patient has had stool cultures done 2 days ago. Patient is on medication prescribed by Dr. Collene Mares for this. Patient had some more symptoms in the past without any specific diagnosis. Patient has not been on [place] recently. The abdominal pain is somewhat generalized pain as worse as 8 out of 10.  Past Medical History  Diagnosis Date  . Migraine   . Depression   . Hypertension   . Dyslipidemia   . Tremor 04/13/2014  . Migraine without aura, without mention of intractable migraine without mention of status migrainosus 04/13/2014  . 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 Cholecystectomy    . Nasal sinus surgery     Family History  Problem Relation Age of Onset  . Cancer Father     stomach cancer  . Migraines Mother    Social History  Substance Use Topics  . Smoking status: Never Smoker   . Smokeless tobacco: Never Used  . Alcohol Use: No   OB History    No data available     Review of Systems  Constitutional: Negative for fever.  HENT: Negative for congestion.   Eyes: Negative for visual disturbance.  Respiratory: Negative for shortness of  breath.   Cardiovascular: Negative for chest pain.  Gastrointestinal: Positive for abdominal pain and diarrhea. Negative for nausea, vomiting and anal bleeding.  Genitourinary: Negative for dysuria.  Musculoskeletal: Negative for back pain.  Skin: Negative for rash.  Neurological: Positive for headaches.  Psychiatric/Behavioral: Negative for confusion.      Allergies  Amoxicillin and Biaxin  Home Medications   Prior to Admission medications   Medication Sig Start Date End Date Taking? Authorizing Provider  aspirin 81 MG chewable tablet Chew 81 mg by mouth daily.    Historical Provider, MD  atorvastatin (LIPITOR) 20 MG tablet Take 20 mg by mouth daily. 05/16/14   Historical Provider, MD  calcium-vitamin D (OSCAL WITH D) 500-200 MG-UNIT per tablet Take 1 tablet by mouth daily.    Historical Provider, MD  Cholecalciferol (VITAMIN D3) 2000 UNITS TABS Take 1 tablet by mouth daily.    Historical Provider, MD  co-enzyme Q-10 50 MG capsule Take 50 mg by mouth daily.    Historical Provider, MD  dexamethasone (DECADRON) 2 MG tablet Take 3 tablets the first day, taper by one tablet daily until off 05/01/15   Kathrynn Ducking, MD  dicyclomine (BENTYL) 20 MG tablet Take 20 mg by mouth every 6 (six) hours as needed.     Historical Provider, MD  glucosamine-chondroitin 500-400 MG tablet Take 1 tablet by mouth 3 (three) times daily.    Historical Provider, MD  imipramine (TOFRANIL) 25 MG tablet  3 tablets at night Patient taking differently: Take 50 mg by mouth at bedtime. 3 tablets at night 01/25/15   Kathrynn Ducking, MD  loratadine (CLARITIN) 10 MG tablet Take 10 mg by mouth daily.    Historical Provider, MD  LORazepam (ATIVAN) 0.5 MG tablet Take 1 mg by mouth at bedtime as needed.  03/22/14   Historical Provider, MD  Magnesium 250 MG TABS Take 1.5 tablets by mouth daily.    Historical Provider, MD  Melatonin 3 MG TABS Take 3 mg by mouth daily.    Historical Provider, MD  metoprolol (LOPRESSOR) 50 MG  tablet Take 25 mg by mouth 2 (two) times daily.  03/28/14   Historical Provider, MD  naproxen sodium (ANAPROX) 220 MG tablet Take 220 mg by mouth 2 (two) times daily with a meal.    Historical Provider, MD  Omega 3 1000 MG CAPS Take 1 capsule by mouth daily.    Historical Provider, MD  omeprazole (PRILOSEC) 20 MG capsule Take 20 mg by mouth daily.    Historical Provider, MD  oxyCODONE-acetaminophen (ROXICET) 5-325 MG per tablet Take 1 tablet by mouth every 6 (six) hours as needed for severe pain. Must last 28 days. 05/01/15   Kathrynn Ducking, MD  QUEtiapine (SEROQUEL) 300 MG tablet Take 300 mg by mouth at bedtime.    Historical Provider, MD  Riboflavin (VITAMIN B-2) 25 MG TABS Take 1 capsule by mouth daily.    Historical Provider, MD  SUMAtriptan (IMITREX) 100 MG tablet TAKE 1 TABLET BY MOUTH TWICE A DAY AS NEEDED FOR MIGRANE 05/26/15   Kathrynn Ducking, MD  topiramate (TOPAMAX) 50 MG tablet Take 3 tablets (150 mg total) by mouth at bedtime. 04/25/15   Kathrynn Ducking, MD  Turmeric 500 MG CAPS Take 2 capsules by mouth daily.    Historical Provider, MD  [place] Name: Suprema Dophilus    Historical Provider, MD  vitamin E 400 UNIT capsule Take 400 Units by mouth daily.    Historical Provider, MD   BP 179/98 mmHg  Pulse 88  Temp(Src) 97.9 F (36.6 C) (Oral)  Resp 18  Ht 5' 7.5" (1.715 m)  Wt 188 lb (85.276 kg)  BMI 28.99 kg/m2  SpO2 100% Physical Exam  Constitutional: She is oriented to person, place, and time. She appears well-developed and well-nourished. No distress.  HENT:  Head: Normocephalic and atraumatic.  Mouth/Throat: Oropharynx is clear and moist.  Eyes: Conjunctivae and EOM are normal. Pupils are equal, round, and reactive to light.  Neck: Normal range of motion.  Cardiovascular: Normal rate and regular rhythm.   No murmur heard. Pulmonary/Chest: Effort normal and breath sounds normal. No respiratory distress.  Abdominal: Soft. Bowel sounds are normal. There is no  tenderness.  Musculoskeletal: Normal range of motion. She exhibits no edema.  Neurological: She is alert and oriented to person, place, and time. No cranial nerve deficit. She exhibits normal muscle tone. Coordination normal.  Skin: Skin is warm. No rash noted.  Nursing note and vitals reviewed.   ED Course  Procedures (including critical care time) Labs Review Labs Reviewed  COMPREHENSIVE METABOLIC PANEL - Abnormal; Notable for the following:    Glucose, Bld 109 (*)    Creatinine, Ser 1.08 (*)    GFR calc non Af Amer 53 (*)    All other components within normal limits  LIPASE, BLOOD - Abnormal; Notable for the following:    Lipase 19 (*)    All other components  within normal limits  CBC  URINALYSIS, ROUTINE W REFLEX MICROSCOPIC (NOT AT [facility])   Results for orders placed or performed during the hospital encounter of 05/31/15  Comprehensive metabolic panel  Result Value Ref Range   Sodium 138 135 - 145 mmol/L   Potassium 3.8 3.5 - 5.1 mmol/L   Chloride 103 101 - 111 mmol/L   CO2 25 22 - 32 mmol/L   Glucose, Bld 109 (H) 65 - 99 mg/dL   BUN 19 6 - 20 mg/dL   Creatinine, Ser 1.08 (H) 0.44 - 1.00 mg/dL   Calcium 9.2 8.9 - 10.3 mg/dL   Total Protein 6.9 6.5 - 8.1 g/dL   Albumin 4.4 3.5 - 5.0 g/dL   AST 41 15 - 41 U/L   ALT 51 14 - 54 U/L   Alkaline Phosphatase 75 38 - 126 U/L   Total Bilirubin 0.5 0.3 - 1.2 mg/dL   GFR calc non Af Amer 53 (L) >60 mL/min   GFR calc Af Amer >60 >60 mL/min   Anion gap 10 5 - 15  CBC  Result Value Ref Range   WBC 7.0 4.0 - 10.5 K/uL   RBC 4.61 3.87 - 5.11 MIL/uL   Hemoglobin 14.2 12.0 - 15.0 g/dL   HCT 42.7 36.0 - 46.0 %   MCV 92.6 78.0 - 100.0 fL   MCH 30.8 26.0 - 34.0 pg   MCHC 33.3 30.0 - 36.0 g/dL   RDW 13.4 11.5 - 15.5 %   Platelets 222 150 - 400 K/uL  Urinalysis, Routine w reflex microscopic (not at [facility])  Result Value Ref Range   Color, Urine YELLOW YELLOW   APPearance CLEAR CLEAR   Specific Gravity, Urine 1.010 1.005 - 1.030    pH 5.5 5.0 - 8.0   Glucose, UA NEGATIVE NEGATIVE mg/dL   Hgb urine dipstick NEGATIVE NEGATIVE   Bilirubin Urine NEGATIVE NEGATIVE   Ketones, ur NEGATIVE NEGATIVE mg/dL   Protein, ur NEGATIVE NEGATIVE mg/dL   Urobilinogen, UA 0.2 0.0 - 1.0 mg/dL   Nitrite NEGATIVE NEGATIVE   Leukocytes, UA NEGATIVE NEGATIVE  Lipase, blood  Result Value Ref Range   Lipase 19 (L) 22 - 51 U/L    Imaging Review Ct Abdomen Pelvis W Contrast  05/31/2015   CLINICAL DATA:  Pain across the abdomen for 2-3 weeks.  EXAM: CT ABDOMEN AND PELVIS WITH CONTRAST  TECHNIQUE: Multidetector CT imaging of the abdomen and pelvis was performed using the standard protocol following bolus administration of intravenous contrast.  CONTRAST:  45mL OMNIPAQUE IOHEXOL 300 MG/ML SOLN, 172mL OMNIPAQUE IOHEXOL 300 MG/ML SOLN  COMPARISON:  05/30/2004  FINDINGS: Lower chest: Punctate calcification in the left lower lobe likely reflecting sequela prior granulomatous disease. Lung bases are otherwise clear. Normal heart size.  Hepatobiliary: Diffuse hepatic low attenuation as can be seen with hepatic steatosis. Prior cholecystectomy.  Pancreas: Normal.  Spleen: Normal.  Adrenals/Urinary Tract: Normal adrenal glands. Normal kidneys. No nephrolithiasis or obstructive uropathy. Normal decompressed bladder.  Stomach/Bowel: No bowel wall thickening or dilatation. Normal caliber appendix without periappendiceal inflammatory changes. No pneumatosis, pneumoperitoneum or portal venous gas. No abdominal or pelvic free fluid.  Vascular/Lymphatic: Normal caliber abdominal aorta with atherosclerosis. No abdominal or pelvic lymphadenopathy.  Reproductive: Normal uterus.  No adnexal mass.  Other: No fluid collection or hematoma.  Musculoskeletal: No acute osseous abnormality. No lytic or sclerotic osseous lesion. Degenerative disc disease with disc height loss at L5-S1 with bilateral facet arthropathy and foraminal narrowing.  IMPRESSION: 1. No acute  abdominal or pelvic  pathology. 2. Hepatic steatosis.   Electronically Signed   By: Kathreen Devoid   On: 05/31/2015 18:42   I have personally reviewed and evaluated these images and lab results as part of my medical decision-making.   EKG Interpretation None      MDM   Final diagnoses:  Generalized abdominal pain  Diarrhea    Patient followed by GI medicine Dr. Collene Mares. Patient with intermittent abdominal pain for 2-3 weeks associated with diarrhea. Recently seen by her GI doctor is had stool cultures done and workup sent here for CT scan. Patient has had similar symptoms in the past with a nonspecific diagnosis. Her GI doctor is treating her current symptoms.  CT scan negative labs without any significant abnormalities no leukocytosis. No fever. Septic cause of the GI symptoms not clear based on CT scan and labs. Patient will follow back up with her GI doctor.    Fredia Sorrow, MD 05/31/15 [phone]

## 2015-06-01 NOTE — Telephone Encounter (Signed)
Patient called to relay to Dr Jannifer Franklin she is to see Dr Fredric Mare at [facility] and is going to have a Sphenopalatine ganglion block on 06/14/15. She is hoping this will get rid of the migraines. She said she was very sick when she talked with you.

## 2015-06-05 ENCOUNTER — Other Ambulatory Visit: Payer: Self-pay | Admitting: Neurology

## 2015-06-05 NOTE — Telephone Encounter (Signed)
Events noted. I did not call the patient. 

## 2015-06-06 ENCOUNTER — Telehealth: Payer: Self-pay | Admitting: Neurology

## 2015-06-06 NOTE — Telephone Encounter (Signed)
Chevette with [employer] @[phone] is calling and need verification on quanity for Sumatriptan 100 mg.  Please call.  Thanks!

## 2015-06-06 NOTE — Telephone Encounter (Signed)
I called back.  Spoke with Daneen.  She reviewed the patient file and could not determine what was needed, and transferred me to [facility].  She reviewed the patents plan, and says Sumatriptan is covered under her plan.  Feels we may have received a PA request in error.  Indicates nothing further is needed at this time.

## 2015-06-20 ENCOUNTER — Telehealth: Payer: Self-pay | Admitting: Neurology

## 2015-06-20 MED ORDER — RIZATRIPTAN BENZOATE 10 MG PO TBDP: 10.0000 mg | ORAL_TABLET | Freq: Three times a day (TID) | ORAL | Status: DC | PRN

## 2015-06-20 NOTE — Telephone Encounter (Signed)
Patient called, can't have Imitrex filled until 07/03/15. Patient is requesting a Rx for something light to help with headaches. The procedure she had done has helped with headaches, they're not as intense. Still experiencing headaches, dizziness on daily basis. She has to be careful what she takes because of sensitive stomach.

## 2015-06-20 NOTE — Telephone Encounter (Signed)
I called the patient. The insurance will not refill the Imitrex until October 3, I will call in a one-time prescription for Maxalt to see if the insurance will this.

## 2015-06-30 DIAGNOSIS — G894 Chronic pain syndrome: Secondary | ICD-10-CM | POA: Insufficient documentation

## 2015-07-03 ENCOUNTER — Other Ambulatory Visit: Payer: Self-pay | Admitting: Neurology

## 2015-07-03 NOTE — Telephone Encounter (Signed)
Kristin Ducking, MD at 06/20/2015 6:41 PM     Status: Signed       Expand All Collapse All   I called the patient. The insurance will not refill the Imitrex until October 3, I will call in a one-time prescription for Maxalt to see if the insurance will this.

## 2015-08-02 ENCOUNTER — Other Ambulatory Visit: Payer: Self-pay | Admitting: Neurology

## 2015-11-01 ENCOUNTER — Ambulatory Visit (INDEPENDENT_AMBULATORY_CARE_PROVIDER_SITE_OTHER): Payer: Medicare Other | Admitting: Neurology

## 2015-11-01 ENCOUNTER — Telehealth: Payer: Self-pay | Admitting: Neurology

## 2015-11-01 ENCOUNTER — Encounter: Payer: Self-pay | Admitting: Neurology

## 2015-11-01 VITALS — BP 162/86 | HR 99

## 2015-11-01 DIAGNOSIS — R251 Tremor, unspecified: Secondary | ICD-10-CM | POA: Diagnosis not present

## 2015-11-01 DIAGNOSIS — G43001 Migraine without aura, not intractable, with status migrainosus: Secondary | ICD-10-CM | POA: Diagnosis not present

## 2015-11-01 DIAGNOSIS — E538 Deficiency of other specified B group vitamins: Secondary | ICD-10-CM

## 2015-11-01 DIAGNOSIS — M25561 Pain in right knee: Secondary | ICD-10-CM | POA: Diagnosis not present

## 2015-11-01 DIAGNOSIS — M25562 Pain in left knee: Secondary | ICD-10-CM | POA: Diagnosis not present

## 2015-11-01 DIAGNOSIS — R5382 Chronic fatigue, unspecified: Secondary | ICD-10-CM

## 2015-11-01 DIAGNOSIS — R600 Localized edema: Secondary | ICD-10-CM | POA: Insufficient documentation

## 2015-11-01 DIAGNOSIS — R609 Edema, unspecified: Secondary | ICD-10-CM | POA: Diagnosis not present

## 2015-11-01 NOTE — Patient Instructions (Signed)
Edema °Edema is an abnormal buildup of fluids in your body tissues. Edema is somewhat dependent on gravity to pull the fluid to the lowest place in your body. That makes the condition more common in the legs and thighs (lower extremities). Painless swelling of the feet and ankles is common and becomes more likely as you get older. It is also common in looser tissues, like around your eyes.  °When the affected area is squeezed, the fluid may move out of that spot and leave a dent for a few moments. This dent is called pitting.  °CAUSES  °There are many possible causes of edema. Eating too much salt and being on your feet or sitting for a long time can cause edema in your legs and ankles. Hot weather may make edema worse. Common medical causes of edema include: °· Heart failure. °· Liver disease. °· Kidney disease. °· Weak blood vessels in your legs. °· Cancer. °· An injury. °· Pregnancy. °· Some medications. °· Obesity.  °SYMPTOMS  °Edema is usually painless. Your skin may look swollen or shiny.  °DIAGNOSIS  °Your health care provider may be able to diagnose edema by asking about your medical history and doing a physical exam. You may need to have tests such as X-rays, an electrocardiogram, or blood tests to check for medical conditions that may cause edema.  °TREATMENT  °Edema treatment depends on the cause. If you have heart, liver, or kidney disease, you need the treatment appropriate for these conditions. General treatment may include: °· Elevation of the affected body part above the level of your heart. °· Compression of the affected body part. Pressure from elastic bandages or support stockings squeezes the tissues and forces fluid back into the blood vessels. This keeps fluid from entering the tissues. °· Restriction of fluid and salt intake. °· Use of a water pill (diuretic). These medications are appropriate only for some types of edema. They pull fluid out of your body and make you urinate more often. This  gets rid of fluid and reduces swelling, but diuretics can have side effects. Only use diuretics as directed by your health care provider. °HOME CARE INSTRUCTIONS  °· Keep the affected body part above the level of your heart when you are lying down.   °· Do not sit still or stand for prolonged periods.   °· Do not put anything directly under your knees when lying down. °· Do not wear constricting clothing or garters on your upper legs.   °· Exercise your legs to work the fluid back into your blood vessels. This may help the swelling go down.   °· Wear elastic bandages or support stockings to reduce ankle swelling as directed by your health care provider.   °· Eat a low-salt diet to reduce fluid if your health care provider recommends it.   °· Only take medicines as directed by your health care provider.  °SEEK MEDICAL CARE IF:  °· Your edema is not responding to treatment. °· You have heart, liver, or kidney disease and notice symptoms of edema. °· You have edema in your legs that does not improve after elevating them.   °· You have sudden and unexplained weight gain. °SEEK IMMEDIATE MEDICAL CARE IF:  °· You develop shortness of breath or chest pain.   °· You cannot breathe when you lie down. °· You develop pain, redness, or warmth in the swollen areas.   °· You have heart, liver, or kidney disease and suddenly get edema. °· You have a fever and your symptoms suddenly get worse. °MAKE SURE YOU:  °·   Understand these instructions. °· Will watch your condition. °· Will get help right away if you are not doing well or get worse. °  °This information is not intended to replace advice given to you by your health care provider. Make sure you discuss any questions you have with your health care provider. °  °Document Released: 09/16/2005 Document Revised: 10/07/2014 Document Reviewed: 07/09/2013 °Elsevier Interactive Patient Education ©2016 [employer] ° °

## 2015-11-01 NOTE — Progress Notes (Signed)
Reason for visit: Leg swelling  Kristin Gilmore is an 65 y.o. female  History of present illness:  Kristin Gilmore is a 65 year old right-handed white female with a history of headaches that have been intractable the past, she suddenly noted in November 2016 that her headaches disappeared, she has had no headaches since that time. The patient indicates that she suddenly developed bilateral leg swelling and leg discomfort that occurred from one day to the next in early January 2017. The patient was in [place] at the time, she has noted ongoing discomfort in the legs up to the knees with cold sensations in the feet, and burning sensations in the leg. The patient indicates that when she first tries to walk the pain will be significant, she feels better when she walks a little bit. The patient however also notes that the leg pain is persistent even when she is lying down, and the swelling does not reduce overnight. The patient denies any low back pain or pain down the legs. She believes that her legs are somewhat weak, she believes that the legs are numb. She denies any issues with the neck or with the arms. She denies any changes in medications around the time of the onset of swelling. She has been placed on gabapentin and Lodine without benefit. The patient is on some Ultram as well for pain. She is sent to this office for an evaluation.  Past Medical History  Diagnosis Date  . Migraine   . Depression   . Hypertension   . Dyslipidemia   . Tremor 04/13/2014  . Migraine without aura, without mention of intractable migraine without mention of status migrainosus 04/13/2014  . Pituitary microadenoma ([place]) 04/13/2014    Past Surgical History  Procedure Laterality Date  . Cholecystectomy    . Nasal sinus surgery      Family History  Problem Relation Age of Onset  . Cancer Father     stomach cancer  . Migraines Mother     Social history:  reports that she has never smoked. She has never  used smokeless tobacco. She reports that she does not drink alcohol or use illicit drugs.    Allergies  Allergen Reactions  . Biaxin [Clarithromycin] Hives  . Amoxicillin     Medications:  Prior to Admission medications   Medication Sig Start Date End Date Taking? Authorizing Provider  acetaminophen (TYLENOL) 500 MG tablet Take 500 mg by mouth every 6 (six) hours as needed.   Yes Historical Provider, MD  aspirin 81 MG chewable tablet Chew 81 mg by mouth daily.   Yes Historical Provider, MD  calcium-vitamin D (OSCAL WITH D) 500-200 MG-UNIT per tablet Take 1 tablet by mouth daily.   Yes Historical Provider, MD  Cholecalciferol (VITAMIN D3) 2000 UNITS TABS Take 1 tablet by mouth daily.   Yes Historical Provider, MD  co-enzyme Q-10 50 MG capsule Take 50 mg by mouth daily.   Yes Historical Provider, MD  etodolac (LODINE) 500 MG tablet Take 500 mg by mouth 2 (two) times daily.   Yes Historical Provider, MD  gabapentin (NEURONTIN) 300 MG capsule Take 300 mg by mouth at bedtime.   Yes Historical Provider, MD  glucosamine-chondroitin 500-400 MG tablet Take 1 tablet by mouth 3 (three) times daily.   Yes Historical Provider, MD  ibuprofen (ADVIL,MOTRIN) 200 MG tablet Take 200 mg by mouth every 6 (six) hours as needed.   Yes Historical Provider, MD  LORazepam (ATIVAN) 0.5 MG tablet Take 1  mg by mouth at bedtime as needed.  03/22/14  Yes Historical Provider, MD  Magnesium 250 MG TABS Take 1.5 tablets by mouth daily.   Yes Historical Provider, MD  metoprolol (LOPRESSOR) 50 MG tablet Take 25 mg by mouth 2 (two) times daily.  03/28/14  Yes Historical Provider, MD  Omega 3 1000 MG CAPS Take 1 capsule by mouth daily.   Yes Historical Provider, MD  QUEtiapine (SEROQUEL) 300 MG tablet Take 300 mg by mouth at bedtime.   Yes Historical Provider, MD  Riboflavin (VITAMIN B-2) 25 MG TABS Take 1 capsule by mouth daily.   Yes Historical Provider, MD  traMADol (ULTRAM) 50 MG tablet Take 50 mg by mouth every 8 (eight)  hours as needed.   Yes Historical Provider, MD  Turmeric 500 MG CAPS Take 2 capsules by mouth daily.   Yes Historical Provider, MD  [place] Name: Suprema Dophilus   Yes Historical Provider, MD  vitamin E 400 UNIT capsule Take 400 Units by mouth daily.   Yes Historical Provider, MD    ROS:  Out of a complete 14 system review of symptoms, the patient complains only of the following symptoms, and all other reviewed systems are negative.  Leg pain, leg swelling  Blood pressure 162/86, pulse 99.  Physical Exam  General: The patient is alert and cooperative at the time of the examination.  Skin: 2+ edema below the knees is noted bilaterally.   Neurologic Exam  Mental status: The patient is alert and oriented x 3 at the time of the examination. The patient has apparent normal recent and remote memory, with an apparently normal attention span and concentration ability.   Cranial nerves: Facial symmetry is present. Speech is normal, no aphasia or dysarthria is noted. Extraocular movements are full. Visual fields are full.  Motor: The patient has good strength in all 4 extremities.  Sensory examination: Soft touch sensation is symmetric on all 4 extremities. The patient indicates a stocking pattern pinprick sensory deficit up to the knees bilaterally, decrease in vibration sensation the feet, present at the ankles. Position sense is intact on both feet. Pinprick, soft touch, vibration sensation and position sensation are normal in the arms.  Coordination: The patient has good finger-nose-finger and heel-to-shin bilaterally.  Gait and station: The patient has a normal gait. Tandem gait is slightly unsteady. Romberg is negative. No drift is seen.  Reflexes: Deep tendon reflexes are symmetric, the patient has well-maintained ankle jerk reflexes bilaterally. Toes are neutral bilaterally.   Assessment/Plan:  1. Bilateral lower extremity discomfort and swelling  The patient has  developed some edema of both legs below the knees that she claims came on suddenly in early January 2017. She reports significant pain in the legs associated with swelling. The patient also reports some sensory alteration below the knees, but the clinical examination shows good strength and reflexes. Certainly, lower extremity edema can result in discomfort, but the patient indicates that there is pain in the legs even at nighttime while having the legs elevated. The patient will undergo blood work today, we will check venous Doppler studies of the legs. We may consider EMG and nerve conduction study evaluation in the future. The patient has Ultram to take if needed for discomfort. The patient reports no other associated symptoms such as shortness of breath, chest pain, or palpitations of the heart.  Jill Alexanders MD 11/01/2015 6:08 PM  [facility] [address] [place], [place] [address]  Phone [phone] Fax [phone]  

## 2015-11-01 NOTE — Telephone Encounter (Signed)
I called the patient. Appointment scheduled for today at 11:30.

## 2015-11-01 NOTE — Telephone Encounter (Signed)
Patient called to advise approximately 2 weeks ago , both legs extremely swollen from knees down, went to [place] to visit family, very painful to get up and go to the bathroom, on the 22nd and 23rd the pain was so excruciating, husband took her to a emergency Doctor, she was given a shot of tramadol, was instructed to ice and elevate, saw PCP-Dr. Redmond Pulling in [facility] Saturday 10/28/15, prescribed Gabapentin, Ultram and Etodolac and diagnosed with extreme edema and felt it was a nerve problem, recommended she see Orthopaedic Dr., patient saw Dr. Tonita Cong who advised her to see a Neurologist, states Dr. Tonita Cong also said this is a nerve problem. Patient requests appointment with Dr. Jannifer Franklin ASAP.

## 2015-11-02 LAB — COMPREHENSIVE METABOLIC PANEL
ALT: 37 IU/L — AB (ref 0–32)
AST: 28 IU/L (ref 0–40)
Albumin/Globulin Ratio: 2.3 (ref 1.1–2.5)
Albumin: 4.5 g/dL (ref 3.6–4.8)
Alkaline Phosphatase: 91 IU/L (ref 39–117)
BUN/Creatinine Ratio: 25 (ref 11–26)
BUN: 29 mg/dL — ABNORMAL HIGH (ref 8–27)
Bilirubin Total: 0.3 mg/dL (ref 0.0–1.2)
CO2: 22 mmol/L (ref 18–29)
CREATININE: 1.18 mg/dL — AB (ref 0.57–1.00)
Calcium: 9.4 mg/dL (ref 8.7–10.3)
Chloride: 101 mmol/L (ref 96–106)
GFR calc Af Amer: 56 mL/min/{1.73_m2} — ABNORMAL LOW (ref >59)
GFR calc non Af Amer: 49 mL/min/{1.73_m2} — ABNORMAL LOW (ref >59)
GLOBULIN, TOTAL: 2 g/dL (ref 1.5–4.5)
Glucose: 161 mg/dL — ABNORMAL HIGH (ref 65–99)
Potassium: 5.3 mmol/L — ABNORMAL HIGH (ref 3.5–5.2)
Sodium: 142 mmol/L (ref 134–144)
Total Protein: 6.5 g/dL (ref 6.0–8.5)

## 2015-11-02 LAB — CBC WITH DIFFERENTIAL/PLATELET
BASOS: 0 %
Basophils Absolute: 0 10*3/uL (ref 0.0–0.2)
EOS (ABSOLUTE): 0 10*3/uL (ref 0.0–0.4)
EOS: 0 %
HEMATOCRIT: 38 % (ref 34.0–46.6)
Hemoglobin: 12.1 g/dL (ref 11.1–15.9)
IMMATURE GRANS (ABS): 0 10*3/uL (ref 0.0–0.1)
IMMATURE GRANULOCYTES: 0 %
Lymphocytes Absolute: 1.8 10*3/uL (ref 0.7–3.1)
Lymphs: 18 %
MCH: 30.3 pg (ref 26.6–33.0)
MCHC: 31.8 g/dL (ref 31.5–35.7)
MCV: 95 fL (ref 79–97)
Monocytes Absolute: 0.9 10*3/uL (ref 0.1–0.9)
Monocytes: 9 %
NEUTROS PCT: 73 %
Neutrophils Absolute: 7.3 10*3/uL — ABNORMAL HIGH (ref 1.4–7.0)
Platelets: 281 10*3/uL (ref 150–379)
RBC: 4 x10E6/uL (ref 3.77–5.28)
RDW: 13.6 % (ref 12.3–15.4)
WBC: 10.1 10*3/uL (ref 3.4–10.8)

## 2015-11-02 LAB — RHEUMATOID FACTOR

## 2015-11-02 LAB — ANA W/REFLEX: Anti Nuclear Antibody(ANA): NEGATIVE

## 2015-11-02 LAB — VITAMIN B12: Vitamin B-12: 338 pg/mL (ref 211–946)

## 2015-11-02 LAB — SEDIMENTATION RATE: SED RATE: 2 mm/h (ref 0–40)

## 2015-11-02 LAB — TSH: TSH: 0.969 u[IU]/mL (ref 0.450–4.500)

## 2015-11-02 LAB — ANGIOTENSIN CONVERTING ENZYME: Angio Convert Enzyme: 82 U/L (ref 14–82)

## 2015-11-05 ENCOUNTER — Encounter (HOSPITAL_BASED_OUTPATIENT_CLINIC_OR_DEPARTMENT_OTHER): Payer: Self-pay | Admitting: *Deleted

## 2015-11-05 ENCOUNTER — Emergency Department (HOSPITAL_BASED_OUTPATIENT_CLINIC_OR_DEPARTMENT_OTHER)
Admission: EM | Admit: 2015-11-05 | Discharge: 2015-11-05 | Disposition: A | Discharge status: Home / Self Care | Payer: Medicare Other | Attending: Emergency Medicine | Admitting: Emergency Medicine

## 2015-11-05 DIAGNOSIS — R600 Localized edema: Secondary | ICD-10-CM | POA: Diagnosis not present

## 2015-11-05 DIAGNOSIS — R42 Dizziness and giddiness: Secondary | ICD-10-CM | POA: Diagnosis not present

## 2015-11-05 DIAGNOSIS — R5383 Other fatigue: Secondary | ICD-10-CM | POA: Insufficient documentation

## 2015-11-05 DIAGNOSIS — J069 Acute upper respiratory infection, unspecified: Secondary | ICD-10-CM | POA: Insufficient documentation

## 2015-11-05 DIAGNOSIS — Z880 Allergy status to penicillin: Secondary | ICD-10-CM | POA: Diagnosis not present

## 2015-11-05 DIAGNOSIS — R110 Nausea: Secondary | ICD-10-CM | POA: Diagnosis not present

## 2015-11-05 DIAGNOSIS — I10 Essential (primary) hypertension: Secondary | ICD-10-CM | POA: Insufficient documentation

## 2015-11-05 DIAGNOSIS — Z86018 Personal history of other benign neoplasm: Secondary | ICD-10-CM | POA: Diagnosis not present

## 2015-11-05 DIAGNOSIS — G43909 Migraine, unspecified, not intractable, without status migrainosus: Secondary | ICD-10-CM | POA: Insufficient documentation

## 2015-11-05 DIAGNOSIS — Z7982 Long term (current) use of aspirin: Secondary | ICD-10-CM | POA: Insufficient documentation

## 2015-11-05 DIAGNOSIS — R0981 Nasal congestion: Secondary | ICD-10-CM | POA: Diagnosis present

## 2015-11-05 DIAGNOSIS — E785 Hyperlipidemia, unspecified: Secondary | ICD-10-CM | POA: Diagnosis not present

## 2015-11-05 LAB — BASIC METABOLIC PANEL
Anion gap: 9 (ref 5–15)
BUN: 30 mg/dL — AB (ref 6–20)
CALCIUM: 9.5 mg/dL (ref 8.9–10.3)
CHLORIDE: 100 mmol/L — AB (ref 101–111)
CO2: 27 mmol/L (ref 22–32)
CREATININE: 0.83 mg/dL (ref 0.44–1.00)
GFR calc non Af Amer: >60 mL/min (ref >60)
Glucose, Bld: 144 mg/dL — ABNORMAL HIGH (ref 65–99)
Potassium: 4.2 mmol/L (ref 3.5–5.1)
SODIUM: 136 mmol/L (ref 135–145)

## 2015-11-05 LAB — URINE MICROSCOPIC-ADD ON

## 2015-11-05 LAB — CBC WITH DIFFERENTIAL/PLATELET
BASOS PCT: 1 %
Basophils Absolute: 0 10*3/uL (ref 0.0–0.1)
EOS ABS: 0.2 10*3/uL (ref 0.0–0.7)
EOS PCT: 2 %
HCT: 37.2 % (ref 36.0–46.0)
Hemoglobin: 11.8 g/dL — ABNORMAL LOW (ref 12.0–15.0)
LYMPHS ABS: 2.7 10*3/uL (ref 0.7–4.0)
Lymphocytes Relative: 31 %
MCH: 30 pg (ref 26.0–34.0)
MCHC: 31.7 g/dL (ref 30.0–36.0)
MCV: 94.7 fL (ref 78.0–100.0)
MONOS PCT: 7 %
Monocytes Absolute: 0.6 10*3/uL (ref 0.1–1.0)
NEUTROS PCT: 59 %
Neutro Abs: 5.2 10*3/uL (ref 1.7–7.7)
PLATELETS: 262 10*3/uL (ref 150–400)
RBC: 3.93 MIL/uL (ref 3.87–5.11)
RDW: 14 % (ref 11.5–15.5)
WBC: 8.7 10*3/uL (ref 4.0–10.5)

## 2015-11-05 LAB — URINALYSIS, ROUTINE W REFLEX MICROSCOPIC
BILIRUBIN URINE: NEGATIVE
Glucose, UA: NEGATIVE mg/dL
HGB URINE DIPSTICK: NEGATIVE
Ketones, ur: NEGATIVE mg/dL
NITRITE: NEGATIVE
PROTEIN: NEGATIVE mg/dL
Specific Gravity, Urine: 1.022 (ref 1.005–1.030)
pH: 6 (ref 5.0–8.0)

## 2015-11-05 NOTE — ED Notes (Signed)
Pt assisted to bathroom, via wc, to obtain urine specimen

## 2015-11-05 NOTE — ED Notes (Signed)
Called to room, pt has facial grimacing, crying, c/o pain at left ac, IV site assessed, site WNL, flushes well, pt demanded IV be removed. IV removed. Pt seems very anxious, emotional support provided, environmental changes made

## 2015-11-05 NOTE — ED Provider Notes (Signed)
CSN: [id number]     Arrival date & time 11/05/15  1321 History  By signing my name below, I, Altamease Oiler, attest that this documentation has been prepared under the direction and in the presence of Davonna Belling, MD. Electronically Signed: Altamease Oiler, ED Scribe. 11/05/2015. 3:28 PM   Chief Complaint  Patient presents with  . Hypertension    The history is provided by the patient. No language interpreter was used.   Kristin Gilmore is a 65 y.o. female with history of HTN, dyslipidemia, pituitary microadenoma, and migraines who presents to the Emergency Department complaining of new and constant dizziness with onset last night. Sitting up exacerbates the dizziness but turning the head does not. Associated symptoms include fatigue, nausea, congestion, and nasal discharge. Pt states that she also has leg and foot pain that she is being seen by her PCP and neurologist for. Pt denies fever, chills, change in appetite, cough, chest pain, and vomiting. She notes that until last night she was in her usual state of health and able to perform her usual activities. Her blood pressures have been running high over the last several days. She discussed the blood pressure with her PCP who prescribed tramadol 1 week ago. She has been using Seroquel and metoprolol as usual.  No known sick contact.  Past Medical History  Diagnosis Date  . Migraine   . Depression   . Hypertension   . Dyslipidemia   . Tremor 04/13/2014  . Migraine without aura, without mention of intractable migraine without mention of status migrainosus 04/13/2014  . Pituitary microadenoma ([place]) 04/13/2014   Past Surgical History  Procedure Laterality Date  . Cholecystectomy    . Nasal sinus surgery     Family History  Problem Relation Age of Onset  . Cancer Father     stomach cancer  . Migraines Mother    Social History  Substance Use Topics  . Smoking status: Never Smoker   . Smokeless tobacco: Never Used  . Alcohol  Use: No   OB History    No data available     Review of Systems  Constitutional: Positive for fatigue. Negative for fever, chills and appetite change.  HENT: Positive for congestion and rhinorrhea. Negative for sore throat.   Respiratory: Negative for cough.   Cardiovascular: Negative for chest pain.  Gastrointestinal: Positive for nausea.  Neurological: Positive for dizziness.  All other systems reviewed and are negative.  Allergies  Biaxin and Amoxicillin  Home Medications   Prior to Admission medications   Medication Sig Start Date End Date Taking? Authorizing Provider  acetaminophen (TYLENOL) 500 MG tablet Take 500 mg by mouth every 6 (six) hours as needed.    Historical Provider, MD  aspirin 81 MG chewable tablet Chew 81 mg by mouth daily.    Historical Provider, MD  calcium-vitamin D (OSCAL WITH D) 500-200 MG-UNIT per tablet Take 1 tablet by mouth daily.    Historical Provider, MD  Cholecalciferol (VITAMIN D3) 2000 UNITS TABS Take 1 tablet by mouth daily.    Historical Provider, MD  co-enzyme Q-10 50 MG capsule Take 50 mg by mouth daily.    Historical Provider, MD  etodolac (LODINE) 500 MG tablet Take 500 mg by mouth 2 (two) times daily.    Historical Provider, MD  gabapentin (NEURONTIN) 300 MG capsule Take 300 mg by mouth at bedtime.    Historical Provider, MD  glucosamine-chondroitin 500-400 MG tablet Take 1 tablet by mouth 3 (three) times daily.  Historical Provider, MD  ibuprofen (ADVIL,MOTRIN) 200 MG tablet Take 200 mg by mouth every 6 (six) hours as needed.    Historical Provider, MD  LORazepam (ATIVAN) 0.5 MG tablet Take 1 mg by mouth at bedtime as needed.  03/22/14   Historical Provider, MD  Magnesium 250 MG TABS Take 1.5 tablets by mouth daily.    Historical Provider, MD  metoprolol (LOPRESSOR) 50 MG tablet Take 25 mg by mouth 2 (two) times daily.  03/28/14   Historical Provider, MD  Omega 3 1000 MG CAPS Take 1 capsule by mouth daily.    Historical Provider, MD   QUEtiapine (SEROQUEL) 300 MG tablet Take 300 mg by mouth at bedtime.    Historical Provider, MD  Riboflavin (VITAMIN B-2) 25 MG TABS Take 1 capsule by mouth daily.    Historical Provider, MD  traMADol (ULTRAM) 50 MG tablet Take 50 mg by mouth every 8 (eight) hours as needed.    Historical Provider, MD  Turmeric 500 MG CAPS Take 2 capsules by mouth daily.    Historical Provider, MD  [place] Name: Suprema Dophilus    Historical Provider, MD  vitamin E 400 UNIT capsule Take 400 Units by mouth daily.    Historical Provider, MD   BP 167/100 mmHg  Pulse 82  Temp(Src) 98.1 F (36.7 C) (Oral)  Resp 18  Ht 5' 7.5" (1.715 m)  Wt 190 lb (86.183 kg)  BMI 29.30 kg/m2  SpO2 95% Physical Exam  Constitutional: She is oriented to person, place, and time. She appears well-developed and well-nourished. No distress.  HENT:  Head: Normocephalic and atraumatic.  Bilateral TMs have no effusion  Eyes: EOM are normal. Pupils are equal, round, and reactive to light. Right eye exhibits no nystagmus. Left eye exhibits no nystagmus.  Neck: Normal range of motion.  Cardiovascular: Normal rate, regular rhythm and normal heart sounds.   Pulmonary/Chest: Effort normal and breath sounds normal.  Abdominal: Soft. She exhibits distension. There is no tenderness.  Musculoskeletal: Normal range of motion. She exhibits edema.  BLE pitting edema   Neurological: She is alert and oriented to person, place, and time.  Face is symmetric Finger nose finger intact  Skin: Skin is warm and dry.  Psychiatric: She has a normal mood and affect. Judgment normal.  Nursing note and vitals reviewed.   ED Course  Procedures (including critical care time)  COORDINATION OF CARE: 3:15 PM Discussed treatment plan which includes lab work and EKG with pt at bedside and pt agreed to plan.  Labs Review Labs Reviewed  BASIC METABOLIC PANEL - Abnormal; Notable for the following:    Chloride 100 (*)    Glucose, Bld 144 (*)     BUN 30 (*)    All other components within normal limits  CBC WITH DIFFERENTIAL/PLATELET - Abnormal; Notable for the following:    Hemoglobin 11.8 (*)    All other components within normal limits  URINALYSIS, ROUTINE W REFLEX MICROSCOPIC (NOT AT [facility]) - Abnormal; Notable for the following:    APPearance CLOUDY (*)    Leukocytes, UA SMALL (*)    All other components within normal limits  URINE MICROSCOPIC-ADD ON - Abnormal; Notable for the following:    Squamous Epithelial / LPF 6-30 (*)    Bacteria, UA RARE (*)    All other components within normal limits  URINE CULTURE    Imaging Review No results found. I have personally reviewed and evaluated these lab results as part of my  medical decision-making.   EKG Interpretation   Date/Time:  Sunday November 05 2015 14:11:05 EST Ventricular Rate:  71 PR Interval:  150 QRS Duration: 84 QT Interval:  416 QTC Calculation: 452 R Axis:   51 Text Interpretation:  Normal sinus rhythm Possible Left atrial enlargement  Abnormal ECG No significant change since last tracing Confirmed by  Tylek Boney  MD, Ovid Curd [phone]) on 11/05/2015 3:08:53 PM      MDM   Final diagnoses:  URI (upper respiratory infection)    Patient with apparent URI area multiple complaints. Feels bad. Had some hypertension. Will discharge home.  I personally performed the services described in this documentation, which was scribed in my presence. The recorded information has been reviewed and is accurate.      Davonna Belling, MD 11/06/15 [phone]

## 2015-11-05 NOTE — ED Notes (Signed)
Pt states feels better after lying down.

## 2015-11-05 NOTE — ED Notes (Signed)
Pt states she is unable still at this time to void for urine specimen, states she will drink more water

## 2015-11-05 NOTE — ED Notes (Signed)
DC instructions given to husband, opportunity for questions provided

## 2015-11-05 NOTE — ED Notes (Signed)
Pt refuses to keep cardiac leads and NBP cuff on, states the chest lead stickers hurt her. Explained and provided rationale for reason to monitor VS.

## 2015-11-05 NOTE — ED Notes (Signed)
BEFAST assessment negative °

## 2015-11-05 NOTE — Discharge Instructions (Signed)
Upper Respiratory Infection, Adult Most upper respiratory infections (URIs) are a viral infection of the air passages leading to the lungs. A URI affects the nose, throat, and upper air passages. The most common type of URI is nasopharyngitis and is typically referred to as "the common cold." URIs run their course and usually go away on their own. Most of the time, a URI does not require medical attention, but sometimes a bacterial infection in the upper airways can follow a viral infection. This is called a secondary infection. Sinus and middle ear infections are common types of secondary upper respiratory infections. Bacterial pneumonia can also complicate a URI. A URI can worsen asthma and chronic obstructive pulmonary disease (COPD). Sometimes, these complications can require emergency medical care and may be life threatening.  CAUSES Almost all URIs are caused by viruses. A virus is a type of germ and can spread from one person to another.  RISKS FACTORS You may be at risk for a URI if:   You smoke.   You have chronic heart or lung disease.  You have a weakened defense (immune) system.   You are very young or very old.   You have nasal allergies or asthma.  You work in crowded or poorly ventilated areas.  You work in health care facilities or schools. SIGNS AND SYMPTOMS  Symptoms typically develop 2-3 days after you come in contact with a cold virus. Most viral URIs last 7-10 days. However, viral URIs from the influenza virus (flu virus) can last 14-18 days and are typically more severe. Symptoms may include:   Runny or stuffy (congested) nose.   Sneezing.   Cough.   Sore throat.   Headache.   Fatigue.   Fever.   Loss of appetite.   Pain in your forehead, behind your eyes, and over your cheekbones (sinus pain).  Muscle aches.  DIAGNOSIS  Your health care provider may diagnose a URI by:  Physical exam.  Tests to check that your symptoms are not due to  another condition such as:  Strep throat.  Sinusitis.  Pneumonia.  Asthma. TREATMENT  A URI goes away on its own with time. It cannot be cured with medicines, but medicines may be prescribed or recommended to relieve symptoms. Medicines may help:  Reduce your fever.  Reduce your cough.  Relieve nasal congestion. HOME CARE INSTRUCTIONS   Take medicines only as directed by your health care provider.   Gargle warm saltwater or take cough drops to comfort your throat as directed by your health care provider.  Use a warm mist humidifier or inhale steam from a shower to increase air moisture. This may make it easier to breathe.  Drink enough fluid to keep your urine clear or pale yellow.   Eat soups and other clear broths and maintain good nutrition.   Rest as needed.   Return to work when your temperature has returned to normal or as your health care provider advises. You may need to stay home longer to avoid infecting others. You can also use a face mask and careful hand washing to prevent spread of the virus.  Increase the usage of your inhaler if you have asthma.   Do not use any tobacco products, including cigarettes, chewing tobacco, or electronic cigarettes. If you need help quitting, ask your health care provider. PREVENTION  The best way to protect yourself from getting a cold is to practice good hygiene.   Avoid oral or hand contact with people with cold   symptoms.   Wash your hands often if contact occurs.  There is no clear evidence that vitamin C, vitamin E, echinacea, or exercise reduces the chance of developing a cold. However, it is always recommended to get plenty of rest, exercise, and practice good nutrition.  SEEK MEDICAL CARE IF:   You are getting worse rather than better.   Your symptoms are not controlled by medicine.   You have chills.  You have worsening shortness of breath.  You have brown or red mucus.  You have yellow or brown nasal  discharge.  You have pain in your face, especially when you bend forward.  You have a fever.  You have swollen neck glands.  You have pain while swallowing.  You have white areas in the back of your throat. SEEK IMMEDIATE MEDICAL CARE IF:   You have severe or persistent:  Headache.  Ear pain.  Sinus pain.  Chest pain.  You have chronic lung disease and any of the following:  Wheezing.  Prolonged cough.  Coughing up blood.  A change in your usual mucus.  You have a stiff neck.  You have changes in your:  Vision.  Hearing.  Thinking.  Mood. MAKE SURE YOU:   Understand these instructions.  Will watch your condition.  Will get help right away if you are not doing well or get worse.   This information is not intended to replace advice given to you by your health care provider. Make sure you discuss any questions you have with your health care provider.   Document Released: 03/12/2001 Document Revised: 01/31/2015 Document Reviewed: 12/22/2013 Elsevier Interactive Patient Education 2016 [employer]vier Inc.  

## 2015-11-05 NOTE — ED Notes (Signed)
MD at bedside. 

## 2015-11-05 NOTE — ED Notes (Addendum)
HTN that started today.  States that she called EMS and they advised her that she could come to an ED for further eval.  Pt reports dizziness, ambulatory without difficulty.  Pt refused a wheelchair.  Reports that she would like to walk.

## 2015-11-05 NOTE — ED Notes (Signed)
Pt placed on automatic vital signs Q30.

## 2015-11-07 ENCOUNTER — Other Ambulatory Visit: Payer: Self-pay

## 2015-11-07 ENCOUNTER — Telehealth: Payer: Self-pay | Admitting: Neurology

## 2015-11-07 ENCOUNTER — Other Ambulatory Visit: Payer: Self-pay | Admitting: Neurology

## 2015-11-07 DIAGNOSIS — R609 Edema, unspecified: Secondary | ICD-10-CM

## 2015-11-07 LAB — URINE CULTURE

## 2015-11-07 NOTE — Telephone Encounter (Signed)
Kristin Gilmore From [facility] called and relayed they tried to schedule patient sooner than 11/10/2015. Patient refused to schedule any sooner.

## 2015-11-07 NOTE — Telephone Encounter (Signed)
Pt called and would like a call back about her lab work. Lab work was done on 11/01/15.She says she in a lot of pain with the legs swelling. She states that she has a hard time getting out of bed in the morning because her legs are swollen. They have not gotten any better since she was here last. In the last office notes there was mention of a Doppler to be ordered. I did not see a referral for her. She would like to get scheduled. Pt is very frustrated right now and feels nothing is being done. She had a pretty bad attitude with me while on the phone. Please call and advise [phone]

## 2015-11-07 NOTE — Telephone Encounter (Signed)
I called the patient. I explained that her lab results were released to her MyChart account, which is why she did not receive a phone call. I explained that her labs were stable and there was nothing clinically significant found, per Dr. Jannifer Franklin' note. I also advised that Hinton Dyer, our referral coordinator, is calling to follow up on the patient's venous doppler study. I advised that this is done at [facility], not in our office, and that she should receive a call from [facility] to schedule this appointment.

## 2015-11-07 NOTE — Telephone Encounter (Signed)
I have spoke to patient and [facility] patient is scheduled for this Friday at  [place] at 3:00 . Patient is having a Doppler and she is aware of all details.

## 2015-11-10 ENCOUNTER — Other Ambulatory Visit: Payer: BLUE CROSS/BLUE SHIELD

## 2015-11-12 NOTE — Progress Notes (Signed)
Electrophysiology Office Note   Date:  11/13/2015   ID:  [place], DOB 1951-01-17, MRN [id number]  PCP:  Tula Nakayama  Cardiologist:   Delrose Rohwer Meredith Leeds, MD    Chief Complaint  Patient presents with  . New Patient (Initial Visit)  . Shortness of Breath  . Edema     History of Present Illness: Kristin Gilmore is a 65 y.o. female who presents today for cardiology evaluation.   She has a history of hypertension and hyperlipidemia. Since before Christmas, she has been having significant leg pain and swelling. She says that the pain is worse in the mornings and when she has to get up at night to use the bathroom. She says when she gets up in the mornings it takes her an hour to get to the door in her room due to the leg pain. This is also been associated with lower extremity edema left greater than right. She does have a ultrasound of her legs pending currently. 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 She does get short of breath when she ambulates, but has no chest pain or palpitations. She sleeps on one pillow at night and does not wake up short of breath. She has also been getting dizzy. 8 days ago she called EMS because of dizziness. When they arrived her blood pressure was in the 220s over 140s and she was taken to [place] [facility]. She was told that she had an acute illness at that time and was discharged. Her blood pressure has since come down.   Today, she denies symptoms of palpitations, chest pain, orthopnea, PND,  claudication, presyncope, syncope, bleeding, or neurologic sequela. The patient is tolerating medications without difficulties and is otherwise without complaint today.    Past Medical History  Diagnosis Date  . Migraine   . Depression   . Hypertension   . Dyslipidemia   . Tremor 04/13/2014  . Migraine without aura, without mention of  intractable migraine without mention of status migrainosus 04/13/2014  . Pituitary microadenoma ([place]) 04/13/2014   Past Surgical History  Procedure Laterality Date  . Cholecystectomy    . Nasal sinus surgery       Current Outpatient Prescriptions  Medication Sig Dispense Refill  . acetaminophen (TYLENOL) 500 MG tablet Take 500 mg by mouth every 6 (six) hours as needed.    Marland Kitchen aspirin 81 MG chewable tablet Chew 81 mg by mouth daily.    . calcium-vitamin D (OSCAL WITH D) 500-200 MG-UNIT per tablet Take 1 tablet by mouth daily.    . Cholecalciferol (VITAMIN D3) 2000 UNITS TABS Take 1 tablet by mouth daily.    Marland Kitchen co-enzyme Q-10 50 MG capsule Take 50 mg by mouth daily.    Marland Kitchen etodolac (LODINE) 500 MG tablet Take 500 mg by mouth 2 (two) times daily.    Marland Kitchen glucosamine-chondroitin 500-400 MG tablet Take 1 tablet by mouth 3 (three) times daily.    Marland Kitchen ibuprofen (ADVIL,MOTRIN) 200 MG tablet Take 200 mg by mouth every 6 (six) hours as needed.    Marland Kitchen LORazepam (ATIVAN) 0.5 MG tablet Take 1 mg by mouth at bedtime as needed.     . Magnesium 250 MG TABS Take 1.5 tablets by mouth daily.    . metoprolol (LOPRESSOR) 50 MG tablet Take 25 mg by mouth 2 (two) times daily.     Marland Kitchen  metoprolol tartrate (LOPRESSOR) 25 MG tablet Take 25 mg by mouth 2 (two) times daily.  0  . Omega 3 1000 MG CAPS Take 1 capsule by mouth daily.    . QUEtiapine (SEROQUEL) 300 MG tablet Take 300 mg by mouth at bedtime.    . Riboflavin (VITAMIN B-2) 25 MG TABS Take 1 capsule by mouth daily.    Marland Kitchen tiZANidine (ZANAFLEX) 4 MG tablet Take 4 mg by mouth daily.  0  . traMADol (ULTRAM) 50 MG tablet Take 50 mg by mouth every 8 (eight) hours as needed.    . Turmeric 500 MG CAPS Take 2 capsules by mouth daily.    Marland Kitchen UNABLE TO FIND Med Name: Suprema Dophilus    . vitamin E 400 UNIT capsule Take 400 Units by mouth daily.     No current facility-administered medications for this visit.    Allergies:   Biaxin and Amoxicillin   Social History:  The patient   reports that she has never smoked. She has never used smokeless tobacco. She reports that she does not drink alcohol or use illicit drugs.   Family History:  The patient's family history includes Cancer in her father; Migraines in her mother.    ROS:  Please see the history of present illness.   Otherwise, review of systems is positive for leg swelling, DOE, wheezing, anxiety, back pain, dizziness.   All other systems are reviewed and negative.    PHYSICAL EXAM: VS:  BP 144/100 mmHg  Pulse 76  Ht 5\' 7"  (1.702 m)  Wt 204 lb 9.6 oz (92.806 kg)  BMI 32.04 kg/m2 , BMI Body mass index is 32.04 kg/(m^2). GEN: Well nourished, well developed, in no acute distress HEENT: normal Neck: no JVD, carotid bruits, or masses Cardiac: RRR; no murmurs, rubs, or gallops, 1-2 edema  Respiratory:  clear to auscultation bilaterally, normal work of breathing GI: soft, nontender, nondistended, + BS MS: no deformity or atrophy Skin: warm and dry Neuro:  Strength and sensation are intact Psych: euthymic mood, full affect  EKG:  EKG is not ordered today. The ekg ordered 2/5 shows sinus rhythm, rate 71  Recent Labs: 04/10/2015: Magnesium 1.8 11/01/2015: ALT 37*; TSH 0.969 11/05/2015: BUN 30*; Creatinine, Ser 0.83; Hemoglobin 11.8*; Platelets 262; Potassium 4.2; Sodium 136    Lipid Panel     Component Value Date/Time   CHOL  04/07/2008 0630    166        ATP III CLASSIFICATION:  <200     mg/dL   Desirable  200-239  mg/dL   Borderline High  >=240    mg/dL   High   TRIG 86 04/07/2008 0630   HDL 64 04/07/2008 0630   CHOLHDL 2.6 04/07/2008 0630   VLDL 17 04/07/2008 0630   LDLCALC  04/07/2008 0630    85        Total Cholesterol/HDL:CHD Risk Coronary Heart Disease Risk Table                     Men   Women  1/2 Average Risk   3.4   3.3     Wt Readings from Last 3 Encounters:  11/13/15 204 lb 9.6 oz (92.806 kg)  11/05/15 190 lb (86.183 kg)  05/31/15 188 lb (85.276 kg)    ASSESSMENT AND  PLAN:  1.  Lower extremity edema: Patient has significant 1-2+ edema in the legs all the way up to the ankles. She also has significant shortness of breath. It  is on the edema would cause the amount of pain that she is having especially in the mornings and when she gets up at night. If the edema was cardiac in nature, it would be worse at the end of the day when she had been upright. That being said due to her shortness of breath, we Lucielle Vokes order a transthoracic echo to see if she has any signs of heart failure.    Current medicines are reviewed at length with the patient today.   The patient does not have concerns regarding her medicines.  The following changes were made today:  none  Labs/ tests ordered today include:  Orders Placed This Encounter  Procedures  . ECHOCARDIOGRAM COMPLETE     Disposition:   FU with Lousie Calico PRN   Signed, Shardee Dieu Meredith Leeds, MD  11/13/2015 12:17 PM     [facility] [address] [place] [address] [phone] (office) [phone] (fax)

## 2015-11-13 ENCOUNTER — Ambulatory Visit (INDEPENDENT_AMBULATORY_CARE_PROVIDER_SITE_OTHER): Payer: Medicare Other | Admitting: Cardiology

## 2015-11-13 ENCOUNTER — Encounter: Payer: Self-pay | Admitting: Cardiology

## 2015-11-13 VITALS — BP 144/100 | HR 76 | Ht 67.0 in | Wt 204.6 lb

## 2015-11-13 DIAGNOSIS — R0602 Shortness of breath: Secondary | ICD-10-CM

## 2015-11-13 DIAGNOSIS — R600 Localized edema: Secondary | ICD-10-CM

## 2015-11-13 NOTE — Patient Instructions (Signed)
Medication Instructions:  Your physician recommends that you continue on your current medications as directed. Please refer to the Current Medication list given to you today.   Labwork: None  Testing/Procedures: Your physician has requested that you have an echocardiogram. Echocardiography is a painless test that uses sound waves to create images of your heart. It provides your doctor with information about the size and shape of your heart and how well your heart's chambers and valves are working. This procedure takes approximately one hour. There are no restrictions for this procedure.  Follow-Up: Your physician recommends that you schedule a follow-up appointment AS NEEDED with cardiology.   Any Other Special Instructions Will Be Listed Below (If Applicable).     If you need a refill on your cardiac medications before your next appointment, please call your pharmacy.

## 2015-11-16 ENCOUNTER — Telehealth: Payer: Self-pay | Admitting: Neurology

## 2015-11-16 ENCOUNTER — Ambulatory Visit
Admission: RE | Admit: 2015-11-16 | Discharge: 2015-11-16 | Disposition: A | Discharge status: Home / Self Care | Payer: Medicare Other | Source: Ambulatory Visit | Attending: Neurology | Admitting: Neurology

## 2015-11-16 DIAGNOSIS — R609 Edema, unspecified: Secondary | ICD-10-CM

## 2015-11-16 IMAGING — US US EXTREM LOW VENOUS BILAT
1 series · 13 of 24 positions shown · non-contrast
Comparison: None.

CLINICAL DATA: Bilateral lower extremity edema



[Series 1: us extrem low venous bilat · 13 of 63 slices shown]
[im 1/63]
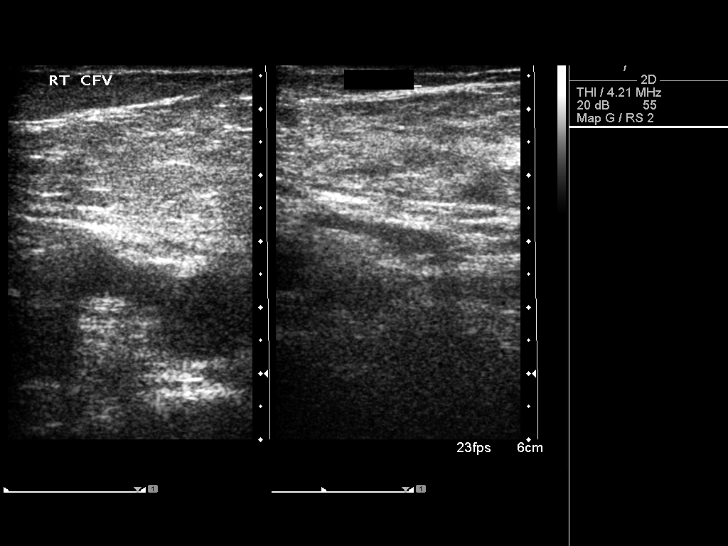
[im 6/63]
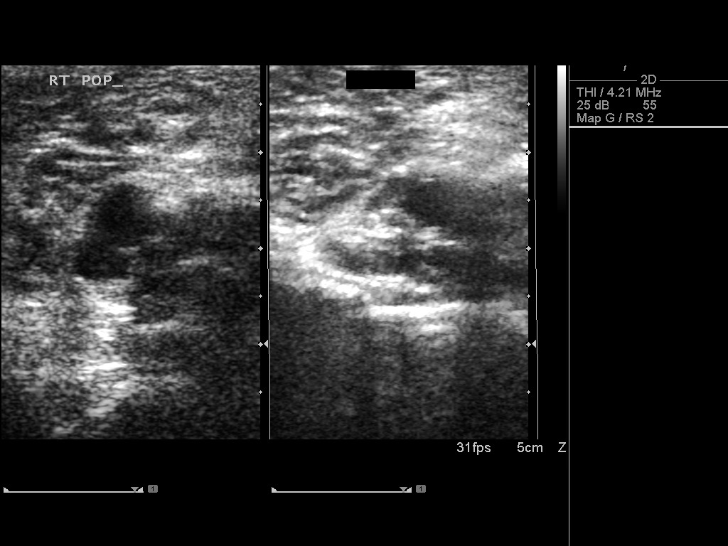
[im 11/63]
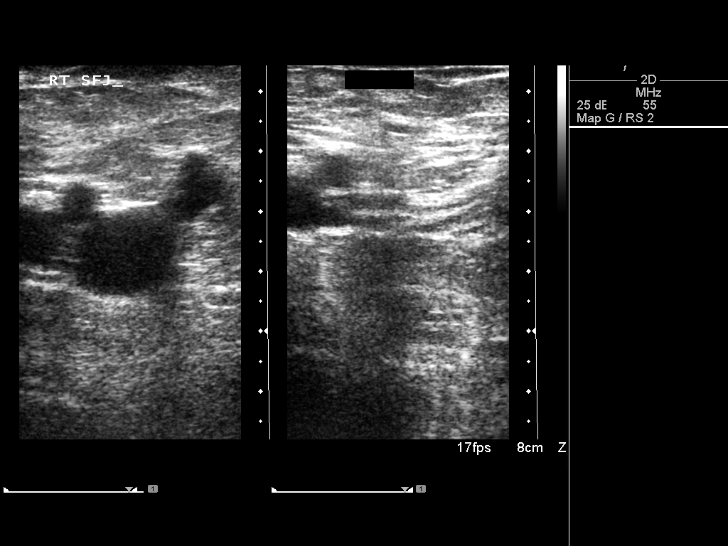
[im 17/63]
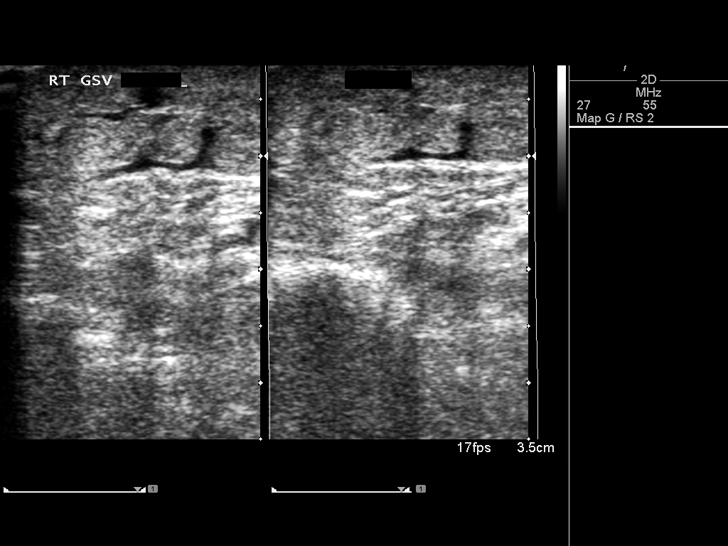
[im 22/63]
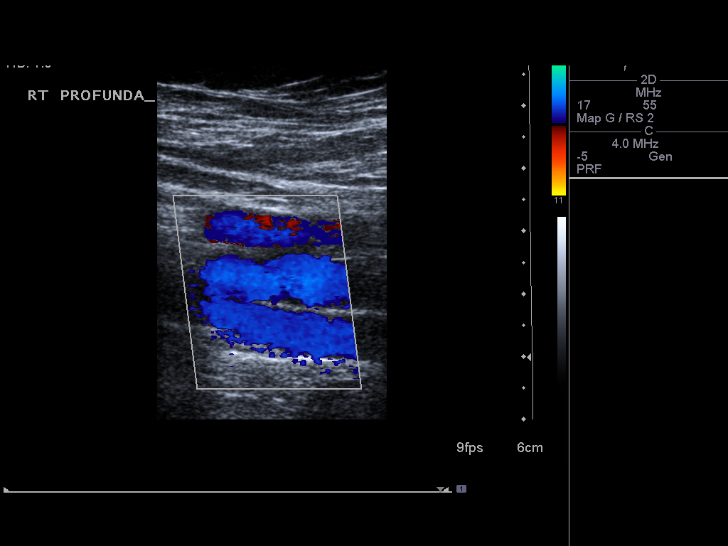
[im 27/63]
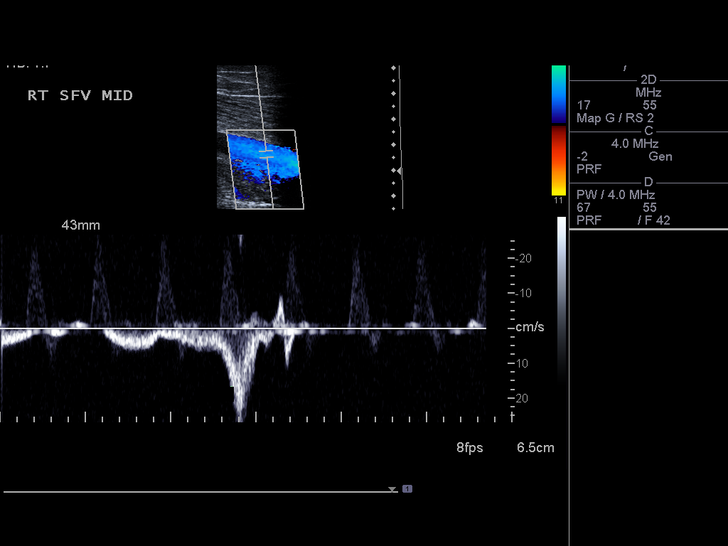
[im 33/63]
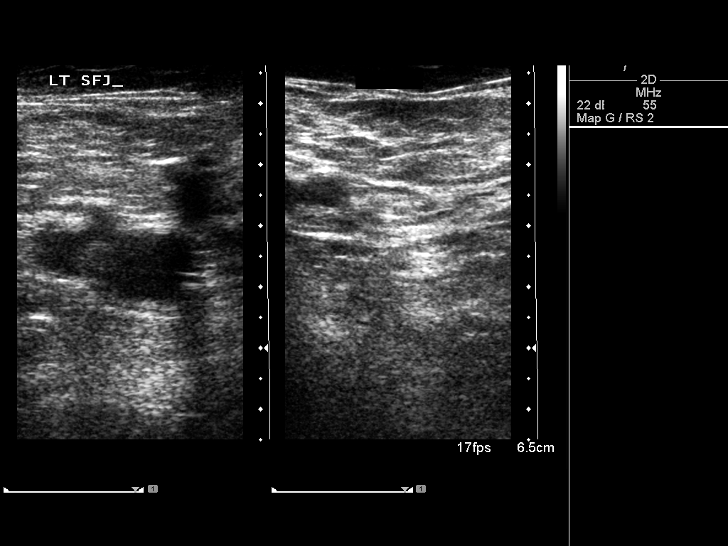
[im 36/63]
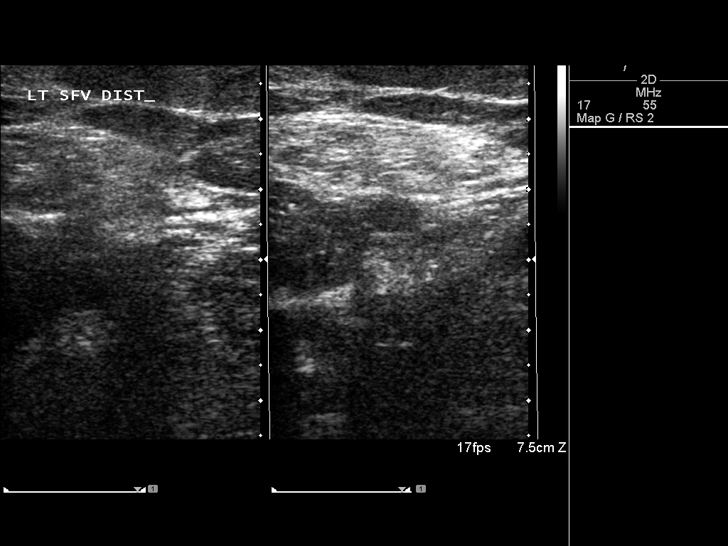
[im 41/63]
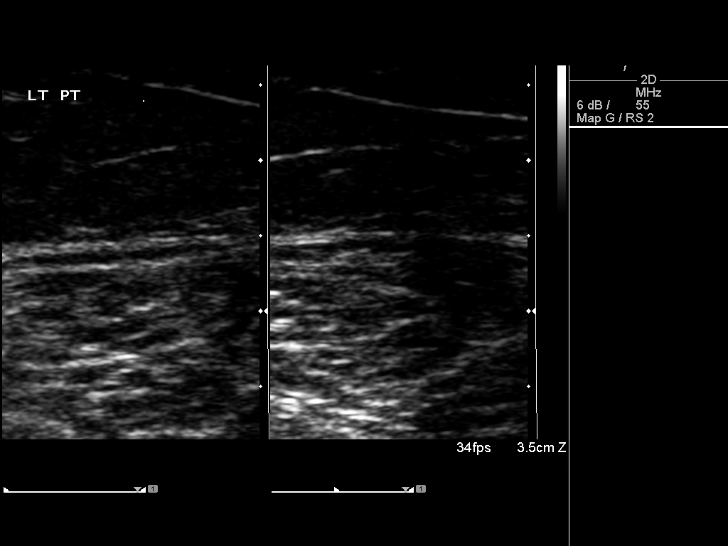
[im 46/63]
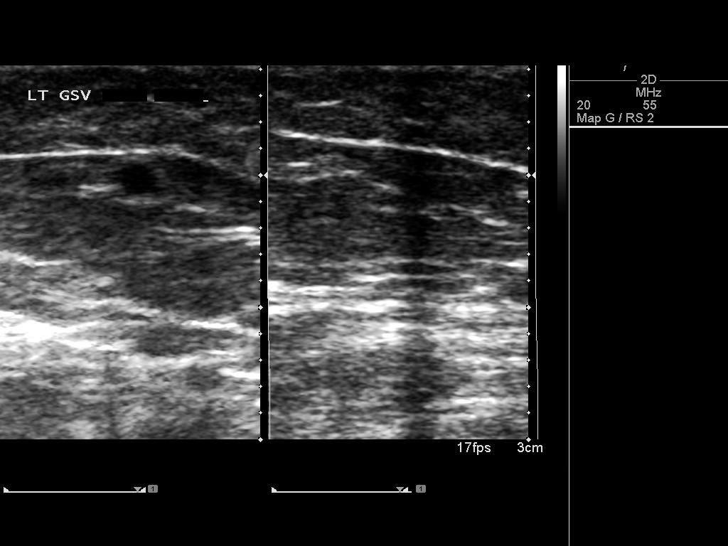
[im 52/63]
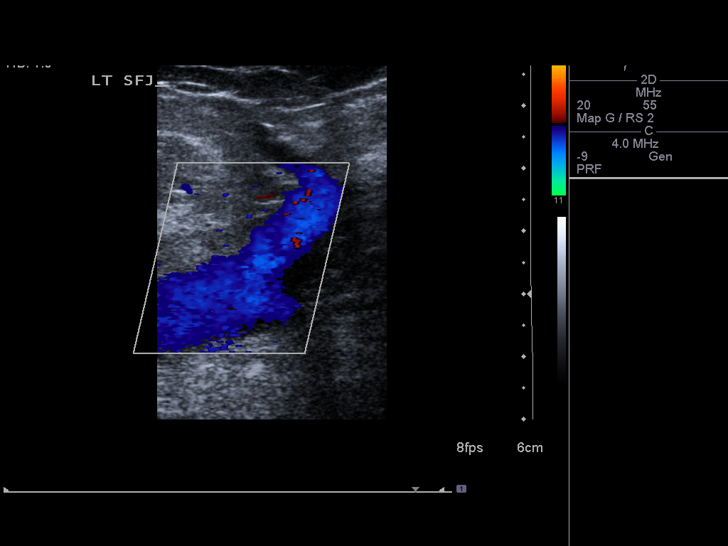
[im 57/63]
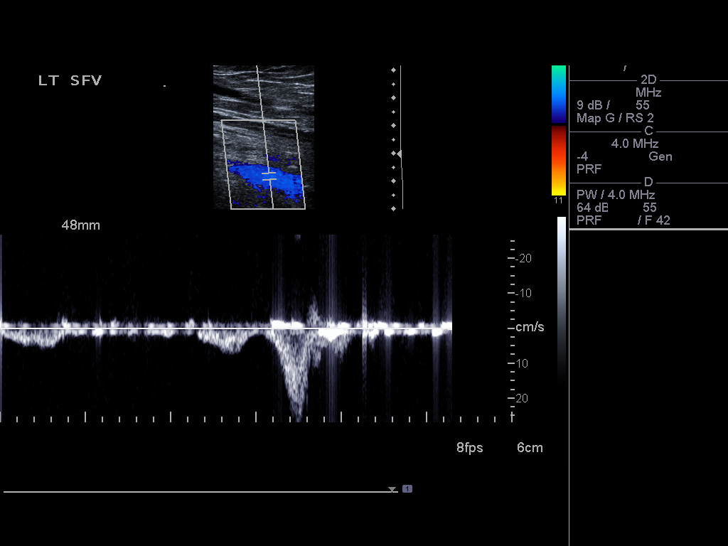
[im 63/63]
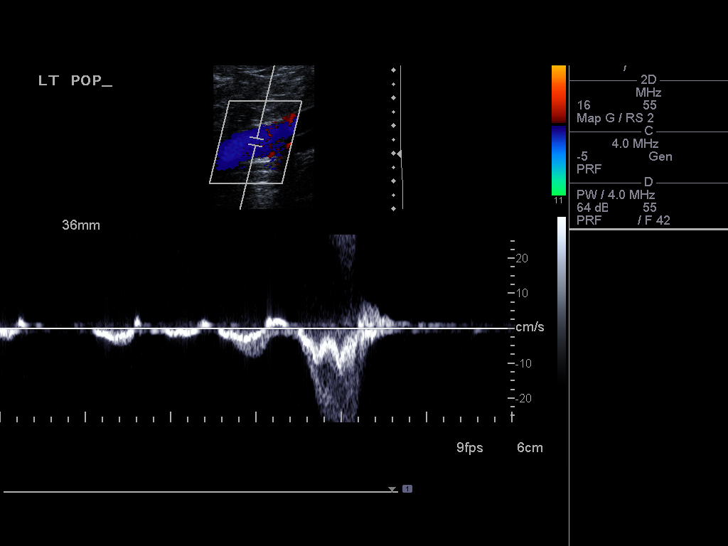

[13 of 24 positions shown; findings below may reference images not displayed]

FINDINGS: RIGHT LOWER EXTREMITY

Common Femoral Vein: No evidence of thrombus. Normal
compressibility, respiratory phasicity and response to augmentation.

Saphenofemoral Junction: No evidence of thrombus. Normal
compressibility and flow on color Doppler imaging.

Profunda Femoral Vein: No evidence of thrombus. Normal
compressibility and flow on color Doppler imaging.

Femoral Vein: No evidence of thrombus. Normal compressibility,
respiratory phasicity and response to augmentation.

Popliteal Vein: No evidence of thrombus. Normal compressibility,
respiratory phasicity and response to augmentation.

Calf Veins: No evidence of thrombus. Normal compressibility and flow
on color Doppler imaging.

Superficial Great Saphenous Vein: No evidence of thrombus. Normal
compressibility and flow on color Doppler imaging.

Venous Reflux:  None.

Other Findings:  None.

LEFT LOWER EXTREMITY

Common Femoral Vein: No evidence of thrombus. Normal
compressibility, respiratory phasicity and response to augmentation.

Saphenofemoral Junction: No evidence of thrombus. Normal
compressibility and flow on color Doppler imaging.

Profunda Femoral Vein: No evidence of thrombus. Normal
compressibility and flow on color Doppler imaging.

Femoral Vein: No evidence of thrombus. Normal compressibility,
respiratory phasicity and response to augmentation.

Popliteal Vein: No evidence of thrombus. Normal compressibility,
respiratory phasicity and response to augmentation.

Calf Veins: No evidence of thrombus. Normal compressibility and flow
on color Doppler imaging.

Superficial Great Saphenous Vein: No evidence of thrombus. Normal
compressibility and flow on color Doppler imaging.

Venous Reflux:  None.

Other Findings:  None.
IMPRESSION: No evidence of deep venous thrombosis.

## 2015-11-16 NOTE — Telephone Encounter (Signed)
  I called the patient, talk with her husband. The venous Doppler studies are unremarkable.  Veinous doppler studies 11/16/15:  IMPRESSION: No evidence of deep venous thrombosis.

## 2015-11-20 ENCOUNTER — Ambulatory Visit: Payer: BLUE CROSS/BLUE SHIELD | Admitting: Cardiology

## 2015-11-23 ENCOUNTER — Other Ambulatory Visit: Payer: Self-pay

## 2015-11-23 ENCOUNTER — Ambulatory Visit (HOSPITAL_COMMUNITY): Payer: Medicare Other | Attending: Cardiology

## 2015-11-23 ENCOUNTER — Other Ambulatory Visit (HOSPITAL_COMMUNITY): Payer: BLUE CROSS/BLUE SHIELD

## 2015-11-23 DIAGNOSIS — R0602 Shortness of breath: Secondary | ICD-10-CM | POA: Diagnosis not present

## 2015-11-23 DIAGNOSIS — I517 Cardiomegaly: Secondary | ICD-10-CM | POA: Diagnosis not present

## 2015-11-23 DIAGNOSIS — R079 Chest pain, unspecified: Secondary | ICD-10-CM | POA: Diagnosis present

## 2015-11-23 DIAGNOSIS — R600 Localized edema: Secondary | ICD-10-CM | POA: Diagnosis not present

## 2015-11-24 ENCOUNTER — Ambulatory Visit: Payer: BLUE CROSS/BLUE SHIELD | Admitting: Cardiology

## 2015-11-27 ENCOUNTER — Encounter: Payer: Self-pay | Admitting: Cardiology

## 2015-11-27 NOTE — Telephone Encounter (Signed)
This encounter was created in error - please disregard.

## 2015-11-27 NOTE — Telephone Encounter (Signed)
New message ° ° ° ° ° °Calling to get echo results °

## 2015-11-29 ENCOUNTER — Telehealth: Payer: Self-pay | Admitting: Neurology

## 2015-11-29 ENCOUNTER — Other Ambulatory Visit: Payer: Self-pay | Admitting: Obstetrics

## 2015-11-29 DIAGNOSIS — M79605 Pain in left leg: Principal | ICD-10-CM

## 2015-11-29 DIAGNOSIS — E2839 Other primary ovarian failure: Secondary | ICD-10-CM

## 2015-11-29 DIAGNOSIS — M79604 Pain in right leg: Secondary | ICD-10-CM

## 2015-11-29 NOTE — Telephone Encounter (Signed)
I called patient. She is having ongoing pain and swelling in the legs, I will get her set up for EMG and nerve conduction study evaluation.  We will do nerve conduction studies on both legs, EMG on one leg.

## 2015-11-29 NOTE — Telephone Encounter (Signed)
Pt called said she wants a referral to [facility] for swelling in her legs. Sts she has seen numerous providers and no one can help her.

## 2015-12-01 ENCOUNTER — Telehealth: Payer: Self-pay | Admitting: Neurology

## 2015-12-01 NOTE — Telephone Encounter (Signed)
Called pt multiple times to schedule NCV/EMG and had to leave messages.  I removed the order from my list as "unable to contact".

## 2015-12-08 ENCOUNTER — Telehealth: Payer: Self-pay | Admitting: Neurology

## 2015-12-08 NOTE — Telephone Encounter (Signed)
I have spoken with pt. and advised that Larene Beach will call her on Monday and sched. for next available EMG/NCV/fim

## 2015-12-08 NOTE — Telephone Encounter (Signed)
Pt called today and was very angry the moment I answered the call. She wanted to know why she had not been called to schedule NCV that she was in a lot of pain. I apologized that she had not been contacted and read what Larene Beach B had documented (i did not give Shannon's name). She said that was not true, she had been at home and no one had called. She was very hateful. I apologized again and she said no, you are not sorry. No one in that office is sorry. I relayed that I do not make those phone calls, I answer the phone and I was very sorry that this had happened. I changed the subject to schedule her appt and she requested I call her back because she was in severe pain standing waiting for an appointment. I agreed to call her back. Within a few minutes I called the pt and when she answered the phone she was irate and demanded to know exactly what time and dates she had been called to schedule NCV previously. Said she had been at home 3/3 all day and 3/1 and no one called her (she said what was going on in her home on those day but I don't recall what she said) she began to say she was in severe pain, no one had helped her, she named 5 providers she had seen without any help, still very angry. Again I reassured her I was sorry she had to go thru the pain and I had NCV appt. It was to be 4/4 but when she heard April she raised her voice and yelled April! I won't even be in town April 4, I am going to [employer] with a friend. She said that was unacceptable. She said to called her back with another appt and hung on me.  This pt is demanding to be seen sooner than April, she is not listening at this point and this operator has done all that can be done on my part. Please call and advise

## 2015-12-11 NOTE — Telephone Encounter (Signed)
12/11/15-After seeing an opening for 12/18/15 I called back to see if the pt was available.  Her husband, Kristin Gilmore, stated that he was given strict instructions not to wake her when she is sleeping.  I advised that there was an opening for the following Monday to have the ordered procedure complete.  He advised that she didn't have anything on her calendar for that day and to put her down.  I told him to please have her call us if she could not keep that appt.  He advised that he would have her call to confirm the appt when she wakes up. He also apologized for her calling and being so irate.  I gave him our number again as he stated he didn't have it.  Pt is scheduled for 12/18/15 at 3:30pm.

## 2015-12-11 NOTE — Telephone Encounter (Signed)
12/11/15-I called pt again and LVM on cell phone, then called the home number to have a gentleman answer and say that she was not available.  The gentleman said "You will need to call back later".  I advised the gentleman to have Ms. Reece call us back at her convenience as I have tried to reach her multiple times unsuccessfully.  He advised that he would.

## 2015-12-15 ENCOUNTER — Telehealth: Payer: Self-pay | Admitting: Neurology

## 2015-12-15 NOTE — Telephone Encounter (Signed)
I called the patient. The patient is having ongoing discomfort from the knees down to the feet. This is associated with swelling. Etiology is not clear, the patient indicates that she has been having leg pain for quite some time, but it has worsened in the last couple months. We will do EMG nerve conduction study next week. She wants a referral to [facility], I am not sure what kind of Dr. To send her to.

## 2015-12-15 NOTE — Telephone Encounter (Signed)
Patient called to advise, "she knows, they're not going to find anything when they do NCS/EMG, I want IMMEDIATE referral to [place]", "don't ever leave a message with my husband who has Parkinson', I will never get the message". (while taking cell number out of computer for patient, pt states, "I don't need to wait, while you take my number out of the computer"), patient was very agitated and rude.

## 2015-12-18 ENCOUNTER — Ambulatory Visit (INDEPENDENT_AMBULATORY_CARE_PROVIDER_SITE_OTHER): Payer: Self-pay | Admitting: Neurology

## 2015-12-18 ENCOUNTER — Ambulatory Visit (INDEPENDENT_AMBULATORY_CARE_PROVIDER_SITE_OTHER): Payer: Medicare Other | Admitting: Neurology

## 2015-12-18 ENCOUNTER — Encounter: Payer: Self-pay | Admitting: Neurology

## 2015-12-18 DIAGNOSIS — Z0289 Encounter for other administrative examinations: Secondary | ICD-10-CM

## 2015-12-18 DIAGNOSIS — M79604 Pain in right leg: Secondary | ICD-10-CM

## 2015-12-18 DIAGNOSIS — M79605 Pain in left leg: Secondary | ICD-10-CM

## 2015-12-18 DIAGNOSIS — R609 Edema, unspecified: Secondary | ICD-10-CM

## 2015-12-18 NOTE — Progress Notes (Signed)
The patient comes in today for EMG and nerve conduction study evaluation. She has edema in the legs associated with pain. Blood work has been done to include a chemistry profile and liver profile that was relatively unremarkable, the patient has chronic stable renal insufficiency. Urinalysis was unremarkable, the patient is not talking protein in the urine. Thyroid profile was unremarkable. The etiology of the peripheral edema is not clear. Venous Doppler studies of the legs were unremarkable.  The nerve conduction study done today was unremarkable, no evidence of a peripheral neuropathy is seen. The patient did not allow for full EMG evaluation of the right leg.  The patient will be given a prescription for compression stockings to see if this helps the discomfort. 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

## 2015-12-18 NOTE — Progress Notes (Signed)
Please refer to EMG and nerve conduction study procedure note. 

## 2015-12-18 NOTE — Procedures (Signed)
     HISTORY:  Kristin Gilmore is a 65 year old patient with a history of onset of swelling in both legs in early January 2017. The patient has associated pain in the legs with the swelling. She is being evaluated for this issue.  NERVE CONDUCTION STUDIES:  Nerve conduction studies were performed on both lower extremities. The distal motor latencies and motor amplitudes for the peroneal and posterior tibial nerves were within normal limits. The nerve conduction velocities for these nerves were also normal. The H reflex latencies were normal. The sensory latencies for the peroneal nerves were within normal limits.   EMG STUDIES:  A limited EMG study was performed on the right lower extremity:  The tibialis anterior muscle reveals 2 to 4K motor units with full recruitment. No fibrillations or positive waves were seen.   IMPRESSION:  Nerve conduction studies done on both lower extremities were within normal limits. No evidence of a peripheral neuropathy is seen. EMG evaluation of the right lower extremity was very limited secondary to patient's refusal. The one muscle study was normal.  C. Floyde Parkins MD 12/18/2015 4:54 PM  [facility] [address] [place], [place] [address]  Phone [phone] Fax [phone]

## 2015-12-19 ENCOUNTER — Inpatient Hospital Stay: Admission: RE | Admit: 2015-12-19 | Payer: Medicare Other | Source: Ambulatory Visit

## 2015-12-20 ENCOUNTER — Telehealth: Payer: Self-pay | Admitting: Neurology

## 2015-12-20 NOTE — Telephone Encounter (Signed)
Dr. Rosine Door is a neurosurgeon at [facility]. I am not sure that this is an appropriate referral.  I called the patient, had a long discussion. The patient was given a prescription for compression stockings for the edema, I think that the edema can be improved the pain will improve. She seems unwilling to do this. We will eliminate the Advil as this can cause swelling, she claims that she is not on Lodine, this was listed as one of her medications. She seems willing now to try the compression stockings. I do not think that a referral to a neurosurgeon is appropriate.

## 2015-12-20 NOTE — Telephone Encounter (Signed)
Pt called said she wants records sent to [facility] to Dr Rosine Door (f) [phone]. She is requesting any OV and test results that relate to the medical problems with her legs. A medical records release is being mailed to the patient.

## 2016-01-08 ENCOUNTER — Inpatient Hospital Stay: Admission: RE | Admit: 2016-01-08 | Payer: Medicare Other | Source: Ambulatory Visit

## 2016-01-09 ENCOUNTER — Telehealth: Payer: Self-pay | Admitting: Neurology

## 2016-01-09 NOTE — Telephone Encounter (Signed)
Pt called not really knowing what she needed from our office. She is getting a referral from Dr. Maxie Better to be sent to Dr. Mearl Latin, [place]. She was told that if she wants all of her information from our office sent she will have to fill out a release of information form. I transferred her to Medical Records to set it up.

## 2016-02-06 ENCOUNTER — Encounter: Payer: Self-pay | Admitting: Vascular Surgery

## 2016-02-06 ENCOUNTER — Other Ambulatory Visit: Payer: Self-pay | Admitting: Vascular Surgery

## 2016-02-06 DIAGNOSIS — I872 Venous insufficiency (chronic) (peripheral): Secondary | ICD-10-CM

## 2016-02-07 ENCOUNTER — Ambulatory Visit: Payer: Medicare Other | Admitting: Podiatry

## 2016-02-14 DIAGNOSIS — R319 Hematuria, unspecified: Secondary | ICD-10-CM | POA: Insufficient documentation

## 2016-02-14 DIAGNOSIS — D229 Melanocytic nevi, unspecified: Secondary | ICD-10-CM | POA: Insufficient documentation

## 2016-02-14 DIAGNOSIS — R739 Hyperglycemia, unspecified: Secondary | ICD-10-CM | POA: Insufficient documentation

## 2016-02-14 DIAGNOSIS — G243 Spasmodic torticollis: Secondary | ICD-10-CM | POA: Insufficient documentation

## 2016-02-14 DIAGNOSIS — R5382 Chronic fatigue, unspecified: Secondary | ICD-10-CM | POA: Insufficient documentation

## 2016-02-14 DIAGNOSIS — D352 Benign neoplasm of pituitary gland: Secondary | ICD-10-CM | POA: Insufficient documentation

## 2016-02-14 DIAGNOSIS — M542 Cervicalgia: Secondary | ICD-10-CM | POA: Insufficient documentation

## 2016-03-07 ENCOUNTER — Other Ambulatory Visit: Payer: Medicare Other

## 2016-03-27 ENCOUNTER — Other Ambulatory Visit: Payer: Self-pay

## 2016-03-27 ENCOUNTER — Emergency Department (HOSPITAL_BASED_OUTPATIENT_CLINIC_OR_DEPARTMENT_OTHER): Payer: Medicare Other

## 2016-03-27 ENCOUNTER — Emergency Department (HOSPITAL_BASED_OUTPATIENT_CLINIC_OR_DEPARTMENT_OTHER)
Admission: EM | Admit: 2016-03-27 | Discharge: 2016-03-27 | Disposition: A | Discharge status: Home / Self Care | Payer: Medicare Other | Attending: Emergency Medicine | Admitting: Emergency Medicine

## 2016-03-27 ENCOUNTER — Encounter (HOSPITAL_BASED_OUTPATIENT_CLINIC_OR_DEPARTMENT_OTHER): Payer: Self-pay | Admitting: *Deleted

## 2016-03-27 DIAGNOSIS — F329 Major depressive disorder, single episode, unspecified: Secondary | ICD-10-CM | POA: Insufficient documentation

## 2016-03-27 DIAGNOSIS — Z79899 Other long term (current) drug therapy: Secondary | ICD-10-CM | POA: Diagnosis not present

## 2016-03-27 DIAGNOSIS — E785 Hyperlipidemia, unspecified: Secondary | ICD-10-CM | POA: Insufficient documentation

## 2016-03-27 DIAGNOSIS — R079 Chest pain, unspecified: Secondary | ICD-10-CM

## 2016-03-27 DIAGNOSIS — I10 Essential (primary) hypertension: Secondary | ICD-10-CM | POA: Diagnosis not present

## 2016-03-27 DIAGNOSIS — M1990 Unspecified osteoarthritis, unspecified site: Secondary | ICD-10-CM | POA: Diagnosis not present

## 2016-03-27 LAB — CBC
HEMATOCRIT: 39.5 % (ref 36.0–46.0)
HEMOGLOBIN: 13.4 g/dL (ref 12.0–15.0)
MCH: 30.9 pg (ref 26.0–34.0)
MCHC: 33.9 g/dL (ref 30.0–36.0)
MCV: 91.2 fL (ref 78.0–100.0)
Platelets: 242 10*3/uL (ref 150–400)
RBC: 4.33 MIL/uL (ref 3.87–5.11)
RDW: 14.5 % (ref 11.5–15.5)
WBC: 7.9 10*3/uL (ref 4.0–10.5)

## 2016-03-27 LAB — TROPONIN I

## 2016-03-27 LAB — BASIC METABOLIC PANEL
Anion gap: 10 (ref 5–15)
BUN: 19 mg/dL (ref 6–20)
CO2: 27 mmol/L (ref 22–32)
Calcium: 9.4 mg/dL (ref 8.9–10.3)
Chloride: 98 mmol/L — ABNORMAL LOW (ref 101–111)
Creatinine, Ser: 1.04 mg/dL — ABNORMAL HIGH (ref 0.44–1.00)
GFR calc Af Amer: >60 mL/min (ref >60)
GFR calc non Af Amer: 55 mL/min — ABNORMAL LOW (ref >60)
Glucose, Bld: 135 mg/dL — ABNORMAL HIGH (ref 65–99)
Potassium: 4.1 mmol/L (ref 3.5–5.1)
Sodium: 135 mmol/L (ref 135–145)

## 2016-03-27 NOTE — ED Notes (Signed)
Pt c/o chest pain and SOB  x 2 days, seen by EMS last PM for same refused transport

## 2016-03-27 NOTE — ED Provider Notes (Signed)
CSN: [id number]     Arrival date & time 03/27/16  1412 History   First MD Initiated Contact with Patient 03/27/16 1533     Chief Complaint  Patient presents with  . Chest Pain     (Consider location/radiation/quality/duration/timing/severity/associated sxs/prior Treatment) HPI Patient reports that she awakened at about 4 in the morning with a sudden and severe pain in the center chest. He reports it was very sharp and aching in quality. It did not radiate anywhere. She reports that EMS came and checked her out and at that time her pain had resolved within 10 minutes. Patient reports that she was told to call her doctor in the morning to schedule a appointment. She now comes to the emergency department for recheck. She denies she's had recurrence of pain since early this morning. The patient poor she's been having chronic problems with shortness of breath for several months. She reports as been exertional. She states last weekend she was at [facility] and it took her a very long time to take a walk. She reports she's had this chronic mucus cough. She reports she's been tried on a number of medications that she is currently taking Bactrim. He reports she's been taking for several days. She reports she also uses an inhaler. She states nothing that has been used to treat it has worked. She states she has a follow-up with a pulmonologist at the end of the week. No personal history of cardiac disease. No family history of cardiac disease. She reports her parents I did cancer and natural causes. A she reports that she used to have reflux. She states that she would have times when the food would come up in the back of her throat. He reports that still if she eats a large meal and she bends over food will come up in her throat. She has not taken medications for this for years. Yesterday evening she reports she had eaten some cold tuna noodle casserole and a biscuit. Past Medical History  Diagnosis Date  .  Migraine   . Depression   . Hypertension   . Dyslipidemia   . Tremor 04/13/2014  . Migraine without aura, without mention of intractable migraine without mention of status migrainosus 04/13/2014  . Pituitary microadenoma ([place]) 04/13/2014  . Arthritis    Past Surgical History  Procedure Laterality Date  . Cholecystectomy    . Nasal sinus surgery     Family History  Problem Relation Age of Onset  . Cancer Father     stomach cancer  . Migraines Mother    Social History  Substance Use Topics  . Smoking status: Never Smoker   . Smokeless tobacco: Never Used  . Alcohol Use: No   OB History    No data available     Review of Systems 10 Systems reviewed and are negative for acute change except as noted in the HPI.    Allergies  Biaxin and Amoxicillin  Home Medications   Prior to Admission medications   Medication Sig Start Date End Date Taking? Authorizing Provider  sulfamethoxazole-trimethoprim (BACTRIM DS,SEPTRA DS) 800-160 MG tablet Take 1 tablet by mouth 2 (two) times daily.   Yes Historical Provider, MD  acetaminophen (TYLENOL) 500 MG tablet Take 500 mg by mouth every 6 (six) hours as needed.    Historical Provider, MD  aspirin 81 MG chewable tablet Chew 81 mg by mouth daily.    Historical Provider, MD  atorvastatin (LIPITOR) 20 MG tablet Take 20 mg  by mouth daily.    Historical Provider, MD  calcium-vitamin D (OSCAL WITH D) 500-200 MG-UNIT per tablet Take 1 tablet by mouth daily.    Historical Provider, MD  Cholecalciferol (VITAMIN D3) 2000 UNITS TABS Take 1 tablet by mouth daily.    Historical Provider, MD  co-enzyme Q-10 50 MG capsule Take 50 mg by mouth daily.    Historical Provider, MD  dicyclomine (BENTYL) 10 MG capsule Take 10 mg by mouth 4 (four) times daily -  before meals and at bedtime.    Historical Provider, MD  glucosamine-chondroitin 500-400 MG tablet Take 1 tablet by mouth 3 (three) times daily.    Historical Provider, MD  ibuprofen (ADVIL,MOTRIN) 200 MG  tablet Take 200 mg by mouth every 6 (six) hours as needed.    Historical Provider, MD  loratadine (CLARITIN) 10 MG tablet Take 10 mg by mouth daily.    Historical Provider, MD  LORazepam (ATIVAN) 0.5 MG tablet Take 1 mg by mouth at bedtime as needed. Reported on 02/06/2016 03/22/14   Historical Provider, MD  Magnesium 250 MG TABS Take 1.5 tablets by mouth daily.    Historical Provider, MD  metoprolol tartrate (LOPRESSOR) 25 MG tablet Take 25 mg by mouth 2 (two) times daily. 10/02/15   Historical Provider, MD  Omega 3 1000 MG CAPS Take 1 capsule by mouth daily.    Historical Provider, MD  QUEtiapine (SEROQUEL) 300 MG tablet Take 150 mg by mouth at bedtime.     Historical Provider, MD  Riboflavin (VITAMIN B-2) 25 MG TABS Take 1 capsule by mouth daily.    Historical Provider, MD  terbinafine (LAMISIL) 1 % cream Apply 1 application topically 2 (two) times daily.    Historical Provider, MD  tiZANidine (ZANAFLEX) 4 MG tablet Take 4 mg by mouth daily. 10/16/15   Historical Provider, MD  traMADol (ULTRAM) 50 MG tablet Take 50 mg by mouth every 8 (eight) hours as needed. Reported on 02/06/2016    Historical Provider, MD  Turmeric 500 MG CAPS Take 2 capsules by mouth daily.    Historical Provider, MD  Lewis Name: Suprema Dophilus    Historical Provider, MD  vitamin E 400 UNIT capsule Take 400 Units by mouth daily.    Historical Provider, MD   BP 153/112 mmHg  Pulse 82  Temp(Src) 97.9 F (36.6 C)  Resp 16  Ht 5\' 7"  (1.702 m)  Wt 190 lb (86.183 kg)  BMI 29.75 kg/m2  SpO2 98% Physical Exam  Constitutional: She is oriented to person, place, and time.  Patient is moderately obese. She is alert and nontoxic. She is ambulatory in the room. No respiratory distress.  HENT:  Head: Normocephalic and atraumatic.  Eyes: EOM are normal.  Cardiovascular: Normal rate, regular rhythm, normal heart sounds and intact distal pulses.   Pulmonary/Chest: Effort normal and breath sounds normal. She exhibits  tenderness.  Lungs are clear with good and symmetric air flow with no expiratory wheeze. No rales are present. Patient endorses some tenderness to palpation in the lower sternum. She reports its been a little uncomfortable all day if she pushes on it.  Abdominal: Soft. Bowel sounds are normal. She exhibits no distension. There is no tenderness.  Musculoskeletal: She exhibits no edema or tenderness.  Patient has no peripheral edema. Calves are soft and nontender.  Neurological: She is alert and oriented to person, place, and time. No cranial nerve deficit. She exhibits normal muscle tone. Coordination normal.  Skin: Skin is warm  and dry.  Psychiatric: She has a normal mood and affect.    ED Course  Procedures (including critical care time) Labs Review Labs Reviewed  BASIC METABOLIC PANEL - Abnormal; Notable for the following:    Chloride 98 (*)    Glucose, Bld 135 (*)    Creatinine, Ser 1.04 (*)    GFR calc non Af Amer 55 (*)    All other components within normal limits  CBC  TROPONIN I    Imaging Review No results found. I have personally reviewed and evaluated these images and lab results as part of my medical decision-making.   EKG Interpretation   Date/Time:  Wednesday March 27 2016 14:20:56 EDT Ventricular Rate:  80 PR Interval:  160 QRS Duration: 90 QT Interval:  398 QTC Calculation: 459 R Axis:   41 Text Interpretation:  Normal sinus rhythm Possible Left atrial enlargement  Cannot rule out Anterior infarct , age undetermined T wave abnormality,  consider lateral ischemia Abnormal ECG No significant change since last  tracing Confirmed by NGUYEN, EMILY ([id number]) on 03/27/2016 2:25:37 PM      MDM   Final diagnoses:  Chest pain, unspecified chest pain type   Patient chest pain early morning that awoke her from sleep. Cardiac enzymes are negative and EKG shows no acute changes. She has not had recurrence of pain. She did not have associated ischemic type symptoms.  Patient had a cardiac echo earlier this year that was without significant abnormality. She does report having a prior stress test cannot recall how long ago. At this time patient is advised to follow-up with her family doctor to discuss stress testing based on her family doctor's impression and her last testing date. Patient is counseled on signs and symptoms were to return. She has not been on PPI for a long time, I have advised 2 week trial of PPI as she awaits completing workup.    Charlesetta Shanks, MD 03/27/16 [phone]

## 2016-03-27 NOTE — ED Notes (Signed)
Pt anxious, stating she is ready to leave, speaking rapidly, wanting her husband in the room with her.  Pt states her husband cannot be left alone.  Nurse first watching pts husband, he relates he does not want to be in the room with her at this time.

## 2016-03-27 NOTE — Discharge Instructions (Signed)
Nonspecific Chest Pain  °Chest pain can be caused by many different conditions. There is always a chance that your pain could be related to something serious, such as a heart attack or a blood clot in your lungs. Chest pain can also be caused by conditions that are not life-threatening. If you have chest pain, it is very important to follow up with your health care provider. °CAUSES  °Chest pain can be caused by: °· Heartburn. °· Pneumonia or bronchitis. °· Anxiety or stress. °· Inflammation around your heart (pericarditis) or lung (pleuritis or pleurisy). °· A blood clot in your lung. °· A collapsed lung (pneumothorax). It can develop suddenly on its own (spontaneous pneumothorax) or from trauma to the chest. °· Shingles infection (varicella-zoster virus). °· Heart attack. °· Damage to the bones, muscles, and cartilage that make up your chest wall. This can include: °¨ Bruised bones due to injury. °¨ Strained muscles or cartilage due to frequent or repeated coughing or overwork. °¨ Fracture to one or more ribs. °¨ Sore cartilage due to inflammation (costochondritis). °RISK FACTORS  °Risk factors for chest pain may include: °· Activities that increase your risk for trauma or injury to your chest. °· Respiratory infections or conditions that cause frequent coughing. °· Medical conditions or overeating that can cause heartburn. °· Heart disease or family history of heart disease. °· Conditions or health behaviors that increase your risk of developing a blood clot. °· Having had chicken pox (varicella zoster). °SIGNS AND SYMPTOMS °Chest pain can feel like: °· Burning or tingling on the surface of your chest or deep in your chest. °· Crushing, pressure, aching, or squeezing pain. °· Dull or sharp pain that is worse when you move, cough, or take a deep breath. °· Pain that is also felt in your back, neck, shoulder, or arm, or pain that spreads to any of these areas. °Your chest pain may come and go, or it may stay  constant. °DIAGNOSIS °Lab tests or other studies may be needed to find the cause of your pain. Your health care provider may have you take a test called an ambulatory ECG (electrocardiogram). An ECG records your heartbeat patterns at the time the test is performed. You may also have other tests, such as: °· Transthoracic echocardiogram (TTE). During echocardiography, sound waves are used to create a picture of all of the heart structures and to look at how blood flows through your heart. °· Transesophageal echocardiogram (TEE). This is a more advanced imaging test that obtains images from inside your body. It allows your health care provider to see your heart in finer detail. °· Cardiac monitoring. This allows your health care provider to monitor your heart rate and rhythm in real time. °· Holter monitor. This is a portable device that records your heartbeat and can help to diagnose abnormal heartbeats. It allows your health care provider to track your heart activity for several days, if needed. °· Stress tests. These can be done through exercise or by taking medicine that makes your heart beat more quickly. °· Blood tests. °· Imaging tests. °TREATMENT  °Your treatment depends on what is causing your chest pain. Treatment may include: °· Medicines. These may include: °¨ Acid blockers for heartburn. °¨ Anti-inflammatory medicine. °¨ Pain medicine for inflammatory conditions. °¨ Antibiotic medicine, if an infection is present. °¨ Medicines to dissolve blood clots. °¨ Medicines to treat coronary artery disease. °· Supportive care for conditions that do not require medicines. This may include: °¨ Resting. °¨ Applying heat   or cold packs to injured areas. °¨ Limiting activities until pain decreases. °HOME CARE INSTRUCTIONS °· If you were prescribed an antibiotic medicine, finish it all even if you start to feel better. °· Avoid any activities that bring on chest pain. °· Do not use any tobacco products, including  cigarettes, chewing tobacco, or electronic cigarettes. If you need help quitting, ask your health care provider. °· Do not drink alcohol. °· Take medicines only as directed by your health care provider. °· Keep all follow-up visits as directed by your health care provider. This is important. This includes any further testing if your chest pain does not go away. °· If heartburn is the cause for your chest pain, you may be told to keep your head raised (elevated) while sleeping. This reduces the chance that acid will go from your stomach into your esophagus. °· Make lifestyle changes as directed by your health care provider. These may include: °¨ Getting regular exercise. Ask your health care provider to suggest some activities that are safe for you. °¨ Eating a heart-healthy diet. A registered dietitian can help you to learn healthy eating options. °¨ Maintaining a healthy weight. °¨ Managing diabetes, if necessary. °¨ Reducing stress. °SEEK MEDICAL CARE IF: °· Your chest pain does not go away after treatment. °· You have a rash with blisters on your chest. °· You have a fever. °SEEK IMMEDIATE MEDICAL CARE IF:  °· Your chest pain is worse. °· You have an increasing cough, or you cough up blood. °· You have severe abdominal pain. °· You have severe weakness. °· You faint. °· You have chills. °· You have sudden, unexplained chest discomfort. °· You have sudden, unexplained discomfort in your arms, back, neck, or jaw. °· You have shortness of breath at any time. °· You suddenly start to sweat, or your skin gets clammy. °· You feel nauseous or you vomit. °· You suddenly feel light-headed or dizzy. °· Your heart begins to beat quickly, or it feels like it is skipping beats. °These symptoms may represent a serious problem that is an emergency. Do not wait to see if the symptoms will go away. Get medical help right away. Call your local emergency services (911 in the [place] Do not drive yourself to the hospital. °  °This  information is not intended to replace advice given to you by your health care provider. Make sure you discuss any questions you have with your health care provider. °  °Document Released: 06/26/2005 Document Revised: 10/07/2014 Document Reviewed: 04/22/2014 °Elsevier Interactive Patient Education ©2016 [employer] ° °

## 2016-03-27 NOTE — ED Notes (Signed)
Pt refusing x-ray at this time. States she had one recently that was normal.

## 2016-03-28 ENCOUNTER — Encounter: Payer: Self-pay | Admitting: Podiatry

## 2016-03-28 ENCOUNTER — Ambulatory Visit (INDEPENDENT_AMBULATORY_CARE_PROVIDER_SITE_OTHER): Payer: Medicare Other | Admitting: Podiatry

## 2016-03-28 VITALS — BP 150/94 | HR 90 | Resp 14

## 2016-03-28 DIAGNOSIS — M79676 Pain in unspecified toe(s): Secondary | ICD-10-CM | POA: Diagnosis not present

## 2016-03-28 DIAGNOSIS — B351 Tinea unguium: Secondary | ICD-10-CM | POA: Diagnosis not present

## 2016-03-28 DIAGNOSIS — M79675 Pain in left toe(s): Secondary | ICD-10-CM

## 2016-03-28 DIAGNOSIS — L600 Ingrowing nail: Secondary | ICD-10-CM | POA: Diagnosis not present

## 2016-03-28 NOTE — Progress Notes (Signed)
   Subjective:    Patient ID: Kristin Gilmore, female    DOB: [birth year], 65 y.o.   MRN: [id number]  HPI this patient presents the office stating that she's having pain and discomfort in her fifth toenail on her left foot. Patient states this is been a problem for years and she has been dealing with it.  She says that the nail grows long and gets pulled in shoes when she walks. She presents the office today requesting an evaluation of her fifth toenail   Review of Systems  All other systems reviewed and are negative.      Objective:   Physical Exam GENERAL APPEARANCE: Alert, conversant. Appropriately groomed. No acute distress.  VASCULAR: Pedal pulses are  palpable at  [facility] and PT bilateral.  Capillary refill time is immediate to all digits,  Normal temperature gradient.  Digital hair growth is present bilateral  NEUROLOGIC: sensation is normal to 5.07 monofilament at 5/5 sites bilateral.  Light touch is intact bilateral, Muscle strength normal.  MUSCULOSKELETAL: acceptable muscle strength, tone and stability bilateral.  Intrinsic muscluature intact bilateral.  Rectus appearance of foot and digits noted bilateral. Adducto-varus fifth toe left foot.  DERMATOLOGIC: skin color, texture, and turgor are within normal limits.  No preulcerative lesions or ulcers  are seen, no interdigital maceration noted.  No open lesions present.  . No drainage noted.  NAILS  Thick disfigured discolored nail fifth toe left foot.        Assessment & Plan:  Onychomycosis 5th toenail left foot  IE  Nail surgery.  Treatment options and alternatives discussed.  Recommended permanent phenol matrixectomy and patient agreed.  Left fifth toe  was prepped with alcohol and a toe block of 3cc of 2% lidocaine plain was administered in a digital toe block. .  The toe was then prepped with betadine solution .  The offending nail border was then excised and matrix tissue exposed.  Phenol was then applied to the matrix  tissue followed by an alcohol wash.  Antibiotic ointment and a dry sterile dressing was applied.  The patient was dispensed instructions for aftercare. RTC 1 week.     Gardiner Barefoot DPM

## 2016-03-30 NOTE — Progress Notes (Signed)
Cardiology Office Note Date:  03/30/2016  Patient ID:  Kristin Gilmore, DOB Jul 19, 1951, MRN [id number] PCP:  Tula Nakayama  Cardiologist:  Dr. Curt Bears  **refresh   Chief Complaint: f/u visit  History of Present Illness: Kristin Gilmore Payment is a 65 y.o. female with history of HTN, HLD, migraine HA, Tremor, depression, comes in today to be seen for dr. Curt Bears.  She saw him last in Feb 2017 for the first visit evaluated for c/o LE swelling and discomfort.  Dr. Curt Bears noted that the pain is worse in the mornings and when she has to get up at night to use the bathroom. She says when she gets up in the mornings it takes her an hour to get to the door in her room due to the leg pain. This is also been associated with lower extremity edema left greater than right. She does have a ultrasound of her legs pending currently. 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  Echo was oredered and found unremarkable.  She was seen in the ED 03/27/16 for CP ruled out and was discharged.  She describes **   ** neuro f/u for ??   Past Medical History  Diagnosis Date  . Migraine   . Depression   . Hypertension   . Dyslipidemia   . Tremor 04/13/2014  . Migraine without aura, without mention of intractable migraine without mention of status migrainosus 04/13/2014  . Pituitary microadenoma ([place]) 04/13/2014  . Arthritis     Past Surgical History  Procedure Laterality Date  . Cholecystectomy    . Nasal sinus surgery      Current Outpatient Prescriptions  Medication Sig Dispense Refill  . acetaminophen (TYLENOL) 500 MG tablet Take 500 mg by mouth every 6 (six) hours as needed.    . calcium-vitamin D (OSCAL WITH D) 500-200 MG-UNIT per tablet Take 1 tablet by mouth daily.    . Cholecalciferol (VITAMIN D3) 2000 UNITS TABS Take 1 tablet by mouth daily.    Marland Kitchen co-enzyme Q-10 50 MG capsule Take 50 mg by  mouth daily.    Marland Kitchen glucosamine-chondroitin 500-400 MG tablet Take 1 tablet by mouth 3 (three) times daily.    Marland Kitchen ibuprofen (ADVIL,MOTRIN) 200 MG tablet Take 200 mg by mouth every 6 (six) hours as needed.    Marland Kitchen LORazepam (ATIVAN) 0.5 MG tablet Take 1 mg by mouth at bedtime as needed. Reported on 02/06/2016    . Magnesium 250 MG TABS Take 1.5 tablets by mouth daily.    . metoprolol tartrate (LOPRESSOR) 25 MG tablet Take 25 mg by mouth 2 (two) times daily.  0  . Omega 3 1000 MG CAPS Take 1 capsule by mouth daily.    . QUEtiapine (SEROQUEL) 300 MG tablet Take 150 mg by mouth at bedtime.     . Riboflavin (VITAMIN B-2) 25 MG TABS Take 1 capsule by mouth daily.    Marland Kitchen sulfamethoxazole-trimethoprim (BACTRIM DS,SEPTRA DS) 800-160 MG tablet Take 1 tablet by mouth 2 (two) times daily.    Marland Kitchen tiZANidine (ZANAFLEX) 4 MG tablet Take 4 mg by mouth daily.  0  . traMADol (ULTRAM) 50 MG tablet Take 50 mg by mouth every 8 (eight) hours as needed. Reported on 02/06/2016    . Turmeric 500 MG CAPS Take 2 capsules by mouth daily.    Marland Kitchen UNABLE TO FIND Med Name: Clementeen Hoof  Dophilus    . vitamin E 400 UNIT capsule Take 400 Units by mouth daily.     No current facility-administered medications for this visit.    Allergies:   Biaxin and Amoxicillin   Social History:  The patient  reports that she has never smoked. She has never used smokeless tobacco. She reports that she drinks alcohol. She reports that she does not use illicit drugs.   Family History:  The patient's family history includes Cancer in her father; Migraines in her mother.  ROS:  Please see the history of present illness.   All other systems are reviewed and otherwise negative.   PHYSICAL EXAM: ** VS:  There were no vitals taken for this visit. BMI: There is no weight on file to calculate BMI. Well nourished, well developed, in no acute distress HEENT: normocephalic, atraumatic Neck: no JVD, carotid bruits or masses Cardiac:  normal S1, S2; RRR; no significant  murmurs, no rubs, or gallops Lungs:  clear to auscultation bilaterally, no wheezing, rhonchi or rales Abd: soft, nontender,  + BS MS: no deformity or atrophy Ext: no edema Skin: warm and dry, no rash Neuro:  No gross deficits appreciated Psych: euthymic mood, full affect   EKG:  Done 11/05/15 showed SR, 03/27/16 without changes  11/23/15: Echocardiogram Study Conclusions - Left ventricle: The cavity size was normal. Wall thickness was  normal. Systolic function was normal. The estimated ejection  fraction was in the range of 55% to 60%. Wall motion was normal;  there were no regional wall motion abnormalities. Doppler  parameters are consistent with both elevated ventricular  end-diastolic filling pressure and elevated left atrial filling  pressure. - Left atrium: The atrium was mildly dilated. - Atrial septum: No defect or patent foramen ovale was identified.  -------------------------------------------------------------------   Recent Labs: 04/10/2015: Magnesium 1.8 11/01/2015: ALT 37*; TSH 0.969 03/27/2016: BUN 19; Creatinine, Ser 1.04*; Hemoglobin 13.4; Platelets 242; Potassium 4.1; Sodium 135  No results found for requested labs within last 365 days.   Estimated Creatinine Clearance: 60.8 mL/min (by C-G formula based on Cr of 1.04).   Wt Readings from Last 3 Encounters:  03/27/16 190 lb (86.183 kg)  11/13/15 204 lb 9.6 oz (92.806 kg)  11/05/15 190 lb (86.183 kg)     Other studies reviewed: Additional studies/records reviewed today include: summarized above  ASSESSMENT AND PLAN:  1. CP     **  2. HTN    **  3. Edema   Disposition: F/u with **  Current medicines are reviewed at length with the patient today.  The patient did not have any concerns regarding medicines.Haywood Lasso, PA-C 03/30/2016 12:32 PM     [facility] [address] [place] [address] [phone] (office)  [phone] (fax)    This  encounter was created in error - please disregard.

## 2016-04-01 ENCOUNTER — Encounter: Payer: Medicare Other | Admitting: Physician Assistant

## 2016-04-03 NOTE — Progress Notes (Signed)
Cardiology Office Note Date: ** Patient ID: Kristin Gilmore, DOB 24-Feb-1951, MRN [id number] PCP: Tula Nakayama Cardiologist: Dr. Curt Bears  refresh  Chief Complaint: f/u visit  History of Present Illness: Kristin Gilmore is a 65 y.o. female with history of HTN, HLD, migraine HA, Tremor, depression, comes in today to be seen for dr. Curt Bears. She saw him last in Feb 2017 for the first visit evaluated for c/o LE swelling and discomfort. Dr. Curt Bears noted that the pain is worse in the mornings and when she has to get up at night to use the bathroom. She says when she gets up in the mornings it takes her an hour to get to the door in her room due to the leg pain. This is also been associated with lower extremity edema left greater than right, with neg LE venous US for DVT.  She says that she had a mild case of polio when she was in her 27s and her left leg is always been smaller and weaker than her right leg. Echo was ordered and found unremarkable.  She was seen in the ED 03/27/16 for CP ruled out and was discharged. She describes **  ** neuro f/u for ??   Past Medical History  Diagnosis Date  . Migraine   . Depression   . Hypertension   . Dyslipidemia   . Tremor 04/13/2014  . Migraine without aura, without mention of intractable migraine without mention of status migrainosus 04/13/2014  . Pituitary microadenoma ([place]) 04/13/2014  . Arthritis     Past Surgical History  Procedure Laterality Date  . Cholecystectomy    . Nasal sinus surgery      Current Outpatient Prescriptions  Medication Sig Dispense Refill  . acetaminophen (TYLENOL) 500 MG tablet Take 500 mg by mouth every 6 (six) hours as needed.    . calcium-vitamin D (OSCAL WITH D) 500-200 MG-UNIT per tablet Take 1 tablet by mouth daily.    . Cholecalciferol (VITAMIN D3) 2000 UNITS TABS Take 1 tablet by mouth daily.    Marland Kitchen co-enzyme Q-10 50 MG capsule Take 50 mg by mouth daily.     Marland Kitchen glucosamine-chondroitin 500-400 MG tablet Take 1 tablet by mouth 3 (three) times daily.    Marland Kitchen ibuprofen (ADVIL,MOTRIN) 200 MG tablet Take 200 mg by mouth every 6 (six) hours as needed.    Marland Kitchen LORazepam (ATIVAN) 0.5 MG tablet Take 1 mg by mouth at bedtime as needed. Reported on 02/06/2016    . Magnesium 250 MG TABS Take 1.5 tablets by mouth daily.    . metoprolol tartrate (LOPRESSOR) 25 MG tablet Take 25 mg by mouth 2 (two) times daily.  0  . Omega 3 1000 MG CAPS Take 1 capsule by mouth daily.    . QUEtiapine (SEROQUEL) 300 MG tablet Take 150 mg by mouth at bedtime.     . Riboflavin (VITAMIN B-2) 25 MG TABS Take 1 capsule by mouth daily.    Marland Kitchen sulfamethoxazole-trimethoprim (BACTRIM DS,SEPTRA DS) 800-160 MG tablet Take 1 tablet by mouth 2 (two) times daily.    Marland Kitchen tiZANidine (ZANAFLEX) 4 MG tablet Take 4 mg by mouth daily.  0  . traMADol (ULTRAM) 50 MG tablet Take 50 mg by mouth every 8 (eight) hours as needed. Reported on 02/06/2016    . Turmeric 500 MG CAPS Take 2 capsules by mouth daily.    Marland Kitchen UNABLE TO FIND Med Name: Suprema Dophilus    . vitamin E 400 UNIT capsule Take 400 Units by  mouth daily.     No current facility-administered medications for this visit.    Allergies: Biaxin and Amoxicillin   Social History: The patient  reports that she has never smoked. She has never used smokeless tobacco. She reports that she drinks alcohol. She reports that she does not use illicit drugs.   Family History: The patient's family history includes Cancer in her father; Migraines in her mother.  ROS: Please see the history of present illness.  All other systems are reviewed and otherwise negative.   PHYSICAL EXAM: ** VS: ** Well nourished, well developed, in no acute distress  HEENT: normocephalic, atraumatic  Neck: no JVD, carotid bruits or masses Cardiac: normal S1, S2; RRR; no significant murmurs, no rubs, or gallops Lungs: clear to auscultation bilaterally,  no wheezing, rhonchi or rales  Abd: soft, nontender MS: no deformity or atrophy Ext: no edema  Skin: warm and dry, no rash Neuro: No gross deficits appreciated Psych: euthymic mood, full affect   EKG: Done 11/05/15 showed SR, 03/27/16 without changes  11/23/15: Echocardiogram Study Conclusions - Left ventricle: The cavity size was normal. Wall thickness was  normal. Systolic function was normal. The estimated ejection  fraction was in the range of 55% to 60%. Wall motion was normal;  there were no regional wall motion abnormalities. Doppler  parameters are consistent with both elevated ventricular  end-diastolic filling pressure and elevated left atrial filling  pressure. - Left atrium: The atrium was mildly dilated. - Atrial septum: No defect or patent foramen ovale was identified.  ------------------------------------------------------------------- 11/16/15: b/l LE venous US IMPRESSION: No evidence of deep venous thrombosis.  Recent Labs: 04/10/2015: Magnesium 1.8 11/01/2015: ALT 37*; TSH 0.969 03/27/2016: BUN 19; Creatinine, Ser 1.04*; Hemoglobin 13.4; Platelets 242; Potassium 4.1; Sodium 135  No results found for requested labs within last 365 days.   Estimated Creatinine Clearance: 60.8 mL/min (by C-G formula based on Cr of 1.04).   Wt Readings from Last 3 Encounters:  03/27/16 190 lb (86.183 kg)  11/13/15 204 lb 9.6 oz (92.806 kg)  11/05/15 190 lb (86.183 kg)     Other studies reviewed: Additional studies/records reviewed today include: summarized above  ASSESSMENT AND PLAN:  1. CP  **  2. HTN  **  3. Edema     **   Disposition: F/u with **  Current medicines are reviewed at length with the patient today. The patient did not have any concerns regarding medicines.**  Signed, Jennings Books, PA-C   [address] [place] [address] [phone] (office)  [phone] (fax)       This encounter  was created in error - please disregard.

## 2016-04-04 ENCOUNTER — Encounter: Payer: Medicare Other | Admitting: Physician Assistant

## 2016-04-04 ENCOUNTER — Ambulatory Visit: Payer: Medicare Other | Admitting: Podiatry

## 2016-04-05 ENCOUNTER — Telehealth: Payer: Self-pay

## 2016-04-05 NOTE — Telephone Encounter (Signed)
Spoke with pt regarding post nail care instructions, she states she is too sick to come to her appt and will need to cancel and reschedule. She was given abts from her visit with her doctor today. Advised her if symptoms change she is to contact our office

## 2016-04-09 ENCOUNTER — Telehealth: Payer: Self-pay | Admitting: *Deleted

## 2016-04-09 NOTE — Telephone Encounter (Signed)
Pt states she had a toenail removed by Dr. Prudence Davidson 03/28/2016 and has been too sick to come in for follow up appt. I spoke with pt, she states her husband says the toe has a dry scab, no drainage and is its normal pink.  Pt asked if she needed to keep soaking and using the antibiotic ointment.  I told pt if the area had a dry hard scab, no drainage, redness or swelling she could stop the soaks and ointment. Pt states she has severe diarrhea, and just got medication from her GI doctor and wanted to know if she needed to still follow up with Dr. Prudence Davidson if the to is fine.  I told her no, but to return if problems developed.

## 2016-04-16 ENCOUNTER — Emergency Department (HOSPITAL_BASED_OUTPATIENT_CLINIC_OR_DEPARTMENT_OTHER)
Admission: EM | Admit: 2016-04-16 | Discharge: 2016-04-16 | Disposition: A | Discharge status: Home / Self Care | Payer: Medicare Other | Attending: Emergency Medicine | Admitting: Emergency Medicine

## 2016-04-16 ENCOUNTER — Encounter (HOSPITAL_BASED_OUTPATIENT_CLINIC_OR_DEPARTMENT_OTHER): Payer: Self-pay | Admitting: *Deleted

## 2016-04-16 DIAGNOSIS — Z79899 Other long term (current) drug therapy: Secondary | ICD-10-CM | POA: Diagnosis not present

## 2016-04-16 DIAGNOSIS — M545 Low back pain: Secondary | ICD-10-CM | POA: Diagnosis present

## 2016-04-16 DIAGNOSIS — M5442 Lumbago with sciatica, left side: Secondary | ICD-10-CM | POA: Insufficient documentation

## 2016-04-16 DIAGNOSIS — I10 Essential (primary) hypertension: Secondary | ICD-10-CM | POA: Diagnosis not present

## 2016-04-16 DIAGNOSIS — M1990 Unspecified osteoarthritis, unspecified site: Secondary | ICD-10-CM | POA: Diagnosis not present

## 2016-04-16 DIAGNOSIS — E785 Hyperlipidemia, unspecified: Secondary | ICD-10-CM | POA: Insufficient documentation

## 2016-04-16 DIAGNOSIS — F329 Major depressive disorder, single episode, unspecified: Secondary | ICD-10-CM | POA: Insufficient documentation

## 2016-04-16 DIAGNOSIS — M5432 Sciatica, left side: Secondary | ICD-10-CM

## 2016-04-16 MED ORDER — TRAMADOL HCL 50 MG PO TABS
50.0000 mg | ORAL_TABLET | Freq: Once | ORAL | Status: DC
Start: 2016-04-16 — End: 2016-04-16

## 2016-04-16 MED ORDER — TRAMADOL HCL 50 MG PO TABS
50.0000 mg | ORAL_TABLET | Freq: Once | ORAL | Status: AC
  Administered 2016-04-16: 50 mg via ORAL
  Filled 2016-04-16: qty 1

## 2016-04-16 NOTE — ED Provider Notes (Signed)
CSN: [id number]     Arrival date & time 04/16/16  1410 History   First MD Initiated Contact with Patient 04/16/16 1535     Chief Complaint  Patient presents with  . Leg Pain     (Consider location/radiation/quality/duration/timing/severity/associated sxs/prior Treatment) HPI Component complains of left lower back pain radiating to left anterior thigh and proximal shin onset 2 nights ago pain is not worse or made better with any particular position. No fever no loss of bladder or bowel control. Treated with ibuprofen, without relief. No other associated symptoms. She's been told several years ago that she has "3 herniated discs" no fever. No weakness no other associated symptoms Past Medical History  Diagnosis Date  . Migraine   . Depression   . Hypertension   . Dyslipidemia   . Tremor 04/13/2014  . Migraine without aura, without mention of intractable migraine without mention of status migrainosus 04/13/2014  . Pituitary microadenoma ([place]) 04/13/2014  . Arthritis    Past Surgical History  Procedure Laterality Date  . Cholecystectomy    . Nasal sinus surgery     Family History  Problem Relation Age of Onset  . Cancer Father     stomach cancer  . Migraines Mother    Social History  Substance Use Topics  . Smoking status: Never Smoker   . Smokeless tobacco: Never Used  . Alcohol Use: 0.0 oz/week    0 Standard drinks or equivalent per week     Comment: occas.  No illicit drug use OB History    No data available     Review of Systems  Musculoskeletal: Positive for myalgias and back pain.       Left leg pain  All other systems reviewed and are negative.     Allergies  Biaxin and Amoxicillin  Home Medications   Prior to Admission medications   Medication Sig Start Date End Date Taking? Authorizing Provider  lipase/protease/amylase (CREON) 12000 units CPEP capsule Take 36,000 Units by mouth.   Yes Historical Provider, MD  acetaminophen (TYLENOL) 500 MG tablet Take  500 mg by mouth every 6 (six) hours as needed.    Historical Provider, MD  calcium-vitamin D (OSCAL WITH D) 500-200 MG-UNIT per tablet Take 1 tablet by mouth daily.    Historical Provider, MD  Cholecalciferol (VITAMIN D3) 2000 UNITS TABS Take 1 tablet by mouth daily.    Historical Provider, MD  co-enzyme Q-10 50 MG capsule Take 50 mg by mouth daily.    Historical Provider, MD  glucosamine-chondroitin 500-400 MG tablet Take 1 tablet by mouth 3 (three) times daily.    Historical Provider, MD  ibuprofen (ADVIL,MOTRIN) 200 MG tablet Take 200 mg by mouth every 6 (six) hours as needed.    Historical Provider, MD  LORazepam (ATIVAN) 0.5 MG tablet Take 1 mg by mouth at bedtime as needed. Reported on 02/06/2016 03/22/14   Historical Provider, MD  Magnesium 250 MG TABS Take 1.5 tablets by mouth daily.    Historical Provider, MD  metoprolol tartrate (LOPRESSOR) 25 MG tablet Take 25 mg by mouth 2 (two) times daily. 10/02/15   Historical Provider, MD  Omega 3 1000 MG CAPS Take 1 capsule by mouth daily.    Historical Provider, MD  QUEtiapine (SEROQUEL) 300 MG tablet Take 150 mg by mouth at bedtime.     Historical Provider, MD  Riboflavin (VITAMIN B-2) 25 MG TABS Take 1 capsule by mouth daily.    Historical Provider, MD  sulfamethoxazole-trimethoprim (BACTRIM DS,SEPTRA DS) 800-160  MG tablet Take 1 tablet by mouth 2 (two) times daily.    Historical Provider, MD  tiZANidine (ZANAFLEX) 4 MG tablet Take 4 mg by mouth daily. 10/16/15   Historical Provider, MD  traMADol (ULTRAM) 50 MG tablet Take 50 mg by mouth every 8 (eight) hours as needed. Reported on 02/06/2016    Historical Provider, MD  Turmeric 500 MG CAPS Take 2 capsules by mouth daily.    Historical Provider, MD  [place] Name: Suprema Dophilus    Historical Provider, MD  vitamin E 400 UNIT capsule Take 400 Units by mouth daily.    Historical Provider, MD   BP 161/89 mmHg  Pulse 82  Temp(Src) 97.9 F (36.6 C) (Oral)  Resp 18  Ht 5\' 7"  (1.702 m)  Wt  190 lb (86.183 kg)  BMI 29.75 kg/m2  SpO2 99% Physical Exam  Constitutional: She appears well-developed and well-nourished. No distress.  HENT:  Head: Normocephalic and atraumatic.  Eyes: Conjunctivae are normal. Pupils are equal, round, and reactive to light.  Neck: Neck supple. No tracheal deviation present. No thyromegaly present.  Cardiovascular: Normal rate and regular rhythm.   No murmur heard. Pulmonary/Chest: Effort normal and breath sounds normal.  Abdominal: Soft. Bowel sounds are normal. She exhibits no distension. There is no tenderness.  Musculoskeletal: Normal range of motion. She exhibits no edema or tenderness.  No point tenderness along spine. All 4 extremities without redness swelling or tenderness. DP pulses 2+ bilaterally radial pulses 2+ bilaterally  Neurological: She is alert. She has normal reflexes. No cranial nerve deficit. Coordination normal.  Gait normal DTRs symmetric bilaterally at knee jerk ankle jerk and biceps toes downward going bilaterally. Motor strength 5 over 5 overall  Skin: Skin is warm and dry. No rash noted.  Psychiatric: She has a normal mood and affect.  Nursing note and vitals reviewed.   ED Course  Procedures (including critical care time) Labs Review Labs Reviewed - No data to display  Imaging Review No results found. I have personally reviewed and evaluated these images and lab results as part of my medical decision-making.   EKG Interpretation None      MDM  Symptoms consistent with sciatica. Patient has tramadol at home. I've advised her to take tramadol 50-100 mg 4 times daily as needed for pain. Avoid ibuprofen. Keep her scheduled appointment tomorrow with Dr. Drema Dallas at orthopedist office. Blood pressure recheck 3 weeks Diagnosis#1 left sided sciatica #2 elevated blood pressure Final diagnoses:  None        Orlie Dakin, MD 04/16/16 1601

## 2016-04-16 NOTE — ED Notes (Signed)
Pt was awoken from sleep 2 nights ago with "extreme left leg pain" described as throbbing and worse in her left upper leg.  Pain unrelieved by position or movement.  Pt took tylenol the first night of pain with some relief.  Pt awoken again with similar pains in left leg last night and took ibuprofen with minimal relief.  Pt c/o pain continuing to return today.  Pain starts in left hip and lower back and radiates down left leg.  Pt took ibuprofen at approx 1300 today.

## 2016-04-16 NOTE — Discharge Instructions (Signed)
Stop ibuprofen as it can be an irritant to your stomach. Take tramadol 50-100 mg 4 times daily as needed for pain. Keep your scheduled appointment tomorrow with Dr.Barnes. Your blood pressure was mildly elevated today at 156/82. It should be rechecked within the next 3 weeks at your primary care physician's office.

## 2016-04-16 NOTE — ED Notes (Signed)
Pain in her lower back x 2 days. Woke with pain down her leg. Thinks she has a pinched nerve.

## 2016-05-01 ENCOUNTER — Encounter: Payer: Self-pay | Admitting: Vascular Surgery

## 2016-05-02 ENCOUNTER — Ambulatory Visit (INDEPENDENT_AMBULATORY_CARE_PROVIDER_SITE_OTHER): Payer: Medicare Other | Admitting: Vascular Surgery

## 2016-05-02 ENCOUNTER — Ambulatory Visit (HOSPITAL_COMMUNITY)
Admission: RE | Admit: 2016-05-02 | Discharge: 2016-05-02 | Disposition: A | Discharge status: Home / Self Care | Payer: Medicare Other | Source: Ambulatory Visit | Attending: Vascular Surgery | Admitting: Vascular Surgery

## 2016-05-02 ENCOUNTER — Encounter: Payer: Self-pay | Admitting: Vascular Surgery

## 2016-05-02 DIAGNOSIS — I872 Venous insufficiency (chronic) (peripheral): Secondary | ICD-10-CM | POA: Diagnosis present

## 2016-05-02 DIAGNOSIS — I10 Essential (primary) hypertension: Secondary | ICD-10-CM | POA: Diagnosis not present

## 2016-05-02 DIAGNOSIS — F329 Major depressive disorder, single episode, unspecified: Secondary | ICD-10-CM | POA: Diagnosis not present

## 2016-05-02 DIAGNOSIS — I8393 Asymptomatic varicose veins of bilateral lower extremities: Secondary | ICD-10-CM | POA: Insufficient documentation

## 2016-05-02 DIAGNOSIS — M25579 Pain in unspecified ankle and joints of unspecified foot: Secondary | ICD-10-CM | POA: Diagnosis not present

## 2016-05-02 NOTE — Progress Notes (Signed)
Referred by:  Aletha Halim, PA-C [address] [place] [address]  Reason for referral: bilateral leg varicosities   History of Present Illness  Kristin Gilmore is a 65 y.o. (10-04-1950) female who presents with chief complaint: bilateral ankle pain.  Patient notes, onset of pain ~6 months ago, sudden at night-time, radiating from knee to ankles.  The patient now has chronic ankle pain with stiffness.  This pain is achy without any radicular component.  She was seen by Dermatology who felt she might have some lipodermatosclerosis.  This has resolved since she saw Derm in April of this year.  The patient notes no swelling.  The patient has had no history of DVT, no history of pregnancy, known history of varicose vein, no history of venous stasis ulcers, no history of  Lymphedema and known history of skin changes in lower legs.  There is no family history of venous disorders.  The patient has used B OTC knee compression stockings at night time.   Past Medical History:  Diagnosis Date  . Arthritis   . Depression   . Dyslipidemia   . Hypertension   . Migraine   . Migraine without aura, without mention of intractable migraine without mention of status migrainosus 04/13/2014  . Pituitary microadenoma ([place]) 04/13/2014  . Tremor 04/13/2014    Past Surgical History:  Procedure Laterality Date  . CHOLECYSTECTOMY    . NASAL SINUS SURGERY      Social History   Social History  . Marital status: Married    Spouse name: N/A  . Number of children: 1  . Years of education: college   Occupational History  . Retired    Social History Main Topics  . Smoking status: Never Smoker  . Smokeless tobacco: Never Used  . Alcohol use 0.0 oz/week     Comment: occas.  . Drug use: No  . Sexual activity: Not on file   Other Topics Concern  . Not on file   Social History Narrative   Patient does not drink caffeine.   Patient is right handed.    Family History  Problem  Relation Age of Onset  . Cancer Father     stomach cancer  . Migraines Mother     Current Outpatient Prescriptions  Medication Sig Dispense Refill  . acetaminophen (TYLENOL) 500 MG tablet Take 500 mg by mouth every 6 (six) hours as needed.    . Cholecalciferol (VITAMIN D3) 2000 UNITS TABS Take 1 tablet by mouth daily.    Marland Kitchen dicyclomine (BENTYL) 20 MG tablet Take 20 mg by mouth 2 (two) times daily.    . diphenoxylate-atropine (LOMOTIL) 2.5-0.025 MG/5ML liquid Take by mouth 4 (four) times daily as needed for diarrhea or loose stools.    . lipase/protease/amylase (CREON) 12000 units CPEP capsule Take 36,000 Units by mouth.    . metoprolol tartrate (LOPRESSOR) 25 MG tablet Take 25 mg by mouth 2 (two) times daily.  0  . QUEtiapine (SEROQUEL) 300 MG tablet Take 150 mg by mouth at bedtime.     . traMADol (ULTRAM) 50 MG tablet Take 50 mg by mouth every 8 (eight) hours as needed. Reported on 02/06/2016    . UNABLE TO FIND Med Name: Suprema Dophilus    . calcium-vitamin D (OSCAL WITH D) 500-200 MG-UNIT per tablet Take 1 tablet by mouth daily.    Marland Kitchen co-enzyme Q-10 50 MG capsule Take 50 mg by mouth daily.    Marland Kitchen glucosamine-chondroitin 500-400 MG  tablet Take 1 tablet by mouth 3 (three) times daily.    Marland Kitchen ibuprofen (ADVIL,MOTRIN) 200 MG tablet Take 200 mg by mouth every 6 (six) hours as needed.    Marland Kitchen LORazepam (ATIVAN) 0.5 MG tablet Take 1 mg by mouth at bedtime as needed. Reported on 02/06/2016    . Magnesium 250 MG TABS Take 1.5 tablets by mouth daily.    . Omega 3 1000 MG CAPS Take 1 capsule by mouth daily.    . Riboflavin (VITAMIN B-2) 25 MG TABS Take 1 capsule by mouth daily.    Marland Kitchen sulfamethoxazole-trimethoprim (BACTRIM DS,SEPTRA DS) 800-160 MG tablet Take 1 tablet by mouth 2 (two) times daily.    Marland Kitchen tiZANidine (ZANAFLEX) 4 MG tablet Take 4 mg by mouth daily.  0  . Turmeric 500 MG CAPS Take 2 capsules by mouth daily.    . vitamin E 400 UNIT capsule Take 400 Units by mouth daily.     No current  facility-administered medications for this visit.     Allergies  Allergen Reactions  . Biaxin [Clarithromycin] Hives  . Amoxicillin      REVIEW OF SYSTEMS:  (Positives checked otherwise negative)  CARDIOVASCULAR:   [ ]  chest pain,  [ ]  chest pressure,  [ ]  palpitations,  [ ]  shortness of breath when laying flat,  [ ]  shortness of breath with exertion,   [ ]  pain in feet when walking,  [ ]  pain in feet when laying flat, [ ]  history of blood clot in veins (DVT),  [ ]  history of phlebitis,  [ ]  swelling in legs,  [ ]  varicose veins  PULMONARY:   [ ]  productive cough,  [ ]  asthma,  [ ]  wheezing  NEUROLOGIC:   [ ]  weakness in arms or legs,  [ ]  numbness in arms or legs,  [ ]  difficulty speaking or slurred speech,  [ ]  temporary loss of vision in one eye,  [ ]  dizziness  HEMATOLOGIC:   [ ]  bleeding problems,  [ ]  problems with blood clotting too easily  MUSCULOSKEL:   [x]  joint pain, [ ]  joint swelling  GASTROINTEST:   [ ]  vomiting blood,  [ ]  blood in stool     GENITOURINARY:   [ ]  burning with urination,  [ ]  blood in urine  PSYCHIATRIC:   [ ]  history of major depression  INTEGUMENTARY:   [x]  rashes: LDS-resolved [ ]  ulcers  CONSTITUTIONAL:   [ ]  fever,  [ ]  chills   Physical Examination  Vitals:   05/02/16 1343 05/02/16 1353  BP: (!) 153/89 (!) 147/82  Pulse: 70   Resp: (!) 24   Temp: 97 F (36.1 C)   SpO2: 99%   Weight: 197 lb 12.8 oz (89.7 kg)   Height: 5' 7.5" (1.715 m)    Body mass index is 30.52 kg/m.  General: A&O x 3, WDWN  Head: [place]/AT  Ear/Nose/Throat: Hearing grossly intact, nares without erythema or drainage, oropharynx without Erythema/Exudate, Mallampati score: 3  Eyes: PERRL, EOMI  Neck: Supple, no nuchal rigidity, no palpable LAD  Pulmonary: Sym exp, good air movt, CTAB, no rales, rhonchi, & wheezing  Cardiac: RRR, Nl S1, S2, no Murmurs, rubs or gallops  Vascular: Vessel Right Left  Radial Palpable Palpable    Brachial Palpable Palpable  Carotid Palpable, without bruit Palpable, without bruit  Aorta Not palpable N/A  Femoral Palpable Palpable  Popliteal Not palpable Not palpable  PT Palpable Palpable  DP Palpable Palpable   Gastrointestinal:  soft, NTND, no G/R, no HSM, no masses, no CVAT B  Musculoskeletal: M/S 5/5 throughout , Extremities without ischemic changes , no LDS, no edema, R>L spider veins, faint varicosities  Neurologic: CN 2-12 intact , Pain and light touch intact in extremities , Motor exam as listed above  Psychiatric: Judgment intact, Mood & affect appropriate for pt's clinical situation  Dermatologic: See M/S exam for extremity exam, no rashes otherwise noted  Lymph : No Cervical, Axillary, or Inguinal lymphadenopathy    Non-Invasive Vascular Imaging  BLE Venous Insufficiency Duplex (Date: 05/02/2016):   RLE:   no DVT and SVT,   no GSV reflux,   no SSV reflux,  + deep venous reflux: CFV  LLE:  no DVT and SVT,   minimal GSV reflux: mid-thigh  no SSV reflux,  + deep venous reflux: CFV, SFJ   Outside Studies/Documentation 2 pages of outside documents were reviewed including: outpatient Derm chart.   Medical Decision Making  Kristin Gilmore is a 65 y.o. female who presents with: BLE chronic venous insufficiency (C2), B ankle pain   Pt's sx are not venous in etiology.  Her CVI is relatively limited, so OTC compression stockings should be adequate.  I discussed with her the proper use of compression stockings.  Her anatomy would not benefit from any GSV interventions.  Her sx are more consistent with an degenerative joint etiology.  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  Further Ortho or Rheum work-up might be more diagnostic in this patient.  Thank you for allowing [place] to participate in this patient's care.   Adele Barthel, MD [facility] Office:  [phone] Pager: [phone]  05/02/2016, 2:13 PM

## 2016-05-03 ENCOUNTER — Telehealth: Payer: Self-pay | Admitting: Gastroenterology

## 2016-05-03 DIAGNOSIS — K219 Gastro-esophageal reflux disease without esophagitis: Secondary | ICD-10-CM

## 2016-05-03 MED ORDER — OMEPRAZOLE 40 MG PO CPDR: 40.0000 mg | DELAYED_RELEASE_CAPSULE | Freq: Every day | ORAL | 0 refills | Status: DC

## 2016-05-03 NOTE — Telephone Encounter (Signed)
Patient of Dr. Collene Mares called about having worsening heartburn and epigastric pain. She reports ongoing for the past month but worse today. She had not had trial of PPI. Offered her omeprazole 40mg  daily for a few weeks to see if this helps and she can take Maalox PRN. If no improvement she can follow up with Dr. Collene Mares.

## 2016-05-22 ENCOUNTER — Encounter (HOSPITAL_COMMUNITY): Payer: Medicare Other

## 2016-05-22 ENCOUNTER — Encounter: Payer: Medicare Other | Admitting: Vascular Surgery

## 2016-06-06 ENCOUNTER — Ambulatory Visit (INDEPENDENT_AMBULATORY_CARE_PROVIDER_SITE_OTHER): Payer: Medicare Other | Admitting: Neurology

## 2016-06-06 ENCOUNTER — Encounter: Payer: Self-pay | Admitting: Neurology

## 2016-06-06 VITALS — BP 146/84 | HR 60 | Ht 67.5 in | Wt 199.5 lb

## 2016-06-06 DIAGNOSIS — G43001 Migraine without aura, not intractable, with status migrainosus: Secondary | ICD-10-CM

## 2016-06-06 DIAGNOSIS — R251 Tremor, unspecified: Secondary | ICD-10-CM

## 2016-06-06 NOTE — Progress Notes (Signed)
Reason for visit: Tremor  Kristin Gilmore is an 64 y.o. female  History of present illness:  Kristin Gilmore is a 65 year old right-handed white female with a history of migraine headaches in the past that have resolved. The patient is now complaining of a left arm and left leg tremor. The patient indicates that the tremor has been present for 2 or 3 years. There has been little progression in the severity of the tremor, but the tremor may be more severe when she is upset, in a rush, or she may notice the tremor more when she is out to dinner. The patient denies any significant issues with feeding herself. She denies any tremor on the right side and therefore her handwriting is not affected. He has had no tremor involving the voice or with the head or neck. She returns to the office today for an evaluation. There have been no changes in balance, no falls.  Past Medical History:  Diagnosis Date  . Arthritis   . Depression   . Dyslipidemia   . Hypertension   . Migraine   . Migraine without aura, without mention of intractable migraine without mention of status migrainosus 04/13/2014  . Pituitary microadenoma ([place]) 04/13/2014  . Tremor 04/13/2014    Past Surgical History:  Procedure Laterality Date  . CHOLECYSTECTOMY    . NASAL SINUS SURGERY      Family History  Problem Relation Age of Onset  . Cancer Father     stomach cancer  . Migraines Mother     Social history:  reports that she has never smoked. She has never used smokeless tobacco. She reports that she drinks alcohol. She reports that she does not use drugs.    Allergies  Allergen Reactions  . Biaxin [Clarithromycin] Hives  . Amoxicillin     Medications:  Prior to Admission medications   Medication Sig Start Date End Date Taking? Authorizing Provider  acetaminophen (TYLENOL) 500 MG tablet Take 500 mg by mouth every 6 (six) hours as needed.    Historical Provider, MD  calcium-vitamin D (OSCAL WITH D) 500-200  MG-UNIT per tablet Take 1 tablet by mouth daily.    Historical Provider, MD  Cholecalciferol (VITAMIN D3) 2000 UNITS TABS Take 1 tablet by mouth daily.    Historical Provider, MD  co-enzyme Q-10 50 MG capsule Take 50 mg by mouth daily.    Historical Provider, MD  dicyclomine (BENTYL) 20 MG tablet Take 20 mg by mouth 2 (two) times daily.    Historical Provider, MD  diphenoxylate-atropine (LOMOTIL) 2.5-0.025 MG/5ML liquid Take by mouth 4 (four) times daily as needed for diarrhea or loose stools.    Historical Provider, MD  glucosamine-chondroitin 500-400 MG tablet Take 1 tablet by mouth 3 (three) times daily.    Historical Provider, MD  ibuprofen (ADVIL,MOTRIN) 200 MG tablet Take 200 mg by mouth every 6 (six) hours as needed.    Historical Provider, MD  lipase/protease/amylase (CREON) 12000 units CPEP capsule Take 36,000 Units by mouth.    Historical Provider, MD  LORazepam (ATIVAN) 0.5 MG tablet Take 1 mg by mouth at bedtime as needed. Reported on 02/06/2016 03/22/14   Historical Provider, MD  Magnesium 250 MG TABS Take 1.5 tablets by mouth daily.    Historical Provider, MD  metoprolol tartrate (LOPRESSOR) 25 MG tablet Take 25 mg by mouth 2 (two) times daily. 10/02/15   Historical Provider, MD  Omega 3 1000 MG CAPS Take 1 capsule by mouth daily.  Historical Provider, MD  omeprazole (PRILOSEC) 40 MG capsule Take 1 capsule (40 mg total) by mouth daily. 05/03/16   Manus Gunning, MD  QUEtiapine (SEROQUEL) 300 MG tablet Take 150 mg by mouth at bedtime.     Historical Provider, MD  Riboflavin (VITAMIN B-2) 25 MG TABS Take 1 capsule by mouth daily.    Historical Provider, MD  sulfamethoxazole-trimethoprim (BACTRIM DS,SEPTRA DS) 800-160 MG tablet Take 1 tablet by mouth 2 (two) times daily.    Historical Provider, MD  tiZANidine (ZANAFLEX) 4 MG tablet Take 4 mg by mouth daily. 10/16/15   Historical Provider, MD  traMADol (ULTRAM) 50 MG tablet Take 50 mg by mouth every 8 (eight) hours as needed. Reported on  02/06/2016    Historical Provider, MD  Turmeric 500 MG CAPS Take 2 capsules by mouth daily.    Historical Provider, MD  [place] Name: Suprema Dophilus    Historical Provider, MD  vitamin E 400 UNIT capsule Take 400 Units by mouth daily.    Historical Provider, MD    ROS:  Out of a complete 14 system review of symptoms, the patient complains only of the following symptoms, and all other reviewed systems are negative.  Fatigue Abdominal pain, diarrhea Tremors Depression  There were no vitals taken for this visit.  Physical Exam  General: The patient is alert and cooperative at the time of the examination. The patient is moderately obese.  Skin: No significant peripheral edema is noted.   Neurologic Exam  Mental status: The patient is alert and oriented x 3 at the time of the examination. The patient has apparent normal recent and remote memory, with an apparently normal attention span and concentration ability.   Cranial nerves: Facial symmetry is present. Speech is normal, no aphasia or dysarthria is noted. Extraocular movements are full. Visual fields are full.  Motor: The patient has good strength in all 4 extremities.  Sensory examination: Soft touch sensation is symmetric on the face, arms, and legs.  Coordination: The patient has good finger-nose-finger and heel-to-shin bilaterally. A resting tremor is noted with the left upper extremity. No tremors noted with finger-nose-finger.  Gait and station: The patient has a normal gait. With walking there may be a slight decrease in arm swing with the left arm. Tandem gait is normal. Romberg is negative. No drift is seen.  Reflexes: Deep tendon reflexes are symmetric.   Assessment/Plan:  1. Left upper extremity tremor  The patient appears to have a resting tremor of the left arm, but no other features of parkinsonism. We will watch the patient conservatively. She may take low-dose Ativan prior to going out to dinner  desire to suppress the tremor. If the tremor worsens or other symptoms develop, she is to contact our office.  Jill Alexanders MD 06/06/2016 1:37 PM  [facility] [address] [place], [place] [address]  Phone [phone] Fax [phone]

## 2016-07-30 ENCOUNTER — Telehealth: Payer: Self-pay | Admitting: Neurology

## 2016-07-30 NOTE — Telephone Encounter (Signed)
Pt called to advise she is having daily HA's for the past couple of weeks. There is an appt on 11/2 @ 8am but she declined, said she has not seen 7:30 am in 24yrs. Please call

## 2016-07-30 NOTE — Telephone Encounter (Signed)
Pt again declined 8 am appt on Thursday. Next available w/ Dr. Jannifer Franklin is not until 08/20/16. Pt scheduled w/ Jinny Blossom NP tomorrow afternoon. Will call her back if any cancellations on Dr. Martin Majestic schedule between now and then.

## 2016-07-31 ENCOUNTER — Encounter: Payer: Self-pay | Admitting: Adult Health

## 2016-07-31 ENCOUNTER — Ambulatory Visit (INDEPENDENT_AMBULATORY_CARE_PROVIDER_SITE_OTHER): Payer: Medicare Other | Admitting: Adult Health

## 2016-07-31 VITALS — BP 158/108 | Resp 20 | Ht 67.0 in | Wt 204.0 lb

## 2016-07-31 DIAGNOSIS — R51 Headache: Secondary | ICD-10-CM

## 2016-07-31 DIAGNOSIS — R0683 Snoring: Secondary | ICD-10-CM | POA: Diagnosis not present

## 2016-07-31 DIAGNOSIS — R519 Headache, unspecified: Secondary | ICD-10-CM

## 2016-07-31 MED ORDER — PREDNISONE 5 MG PO TABS: ORAL_TABLET | ORAL | 0 refills | Status: DC

## 2016-07-31 NOTE — Progress Notes (Signed)
I have read the note, and I agree with the clinical assessment and plan.  Jhordan Kinter KEITH   

## 2016-07-31 NOTE — Patient Instructions (Signed)
Try Prednisone dosepak If this does not break headache cycle we will try topamax If your symptoms worsen or you develop new symptoms please let us know.

## 2016-07-31 NOTE — Progress Notes (Signed)
PATIENT: Kristin Gilmore DOB: [birth year]  REASON FOR VISIT: follow up- migraine HISTORY FROM: patient  HISTORY OF PRESENT ILLNESS: Kristin Gilmore is a 65 year old female with a history of migraine headaches. She returns today for follow-up. She states in the last 2 months her headaches have returned. She has essentially been headache free since November 2016. She states that these headaches are not her typical migraines. She describes it as a dull headache but usually starts when she wakes up in the morning and gets worse as the day goes on. The headache is located across the forehead. At times she feels that she has dark circles around her eyes. She states that loud noises and bright lights do bother her. In the past she has tried and failed several medications for migraine prevention. She denies any numbness or tingling in the upper or lower extremities. The patient states that she does snore occasionally at night. She denies any excessive daytime sleepiness. In the past she has tried a prednisone Dosepak with good benefit. She is also been on Botox for migraine prevention. She is also tried Chemical engineer. She denies any new neurological symptoms. She returns today for an evaluation.  HISTORY 06/06/16:Kristin Gilmore is a 65 year old right-handed white female with a history of migraine headaches in the past that have resolved. The patient is now complaining of a left arm and left leg tremor. The patient indicates that the tremor has been present for 2 or 3 years. There has been little progression in the severity of the tremor, but the tremor may be more severe when she is upset, in a rush, or she may notice the tremor more when she is out to dinner. The patient denies any significant issues with feeding herself. She denies any tremor on the right side and therefore her handwriting is not affected. He has had no tremor involving the voice or with the head or neck. She returns to the office today for an  evaluation. There have been no changes in balance, no falls.  REVIEW OF SYSTEMS: Out of a complete 14 system review of symptoms, the patient complains only of the following symptoms, and all other reviewed systems are negative.  Headache ALLERGIES: Allergies  Allergen Reactions  . Biaxin [Clarithromycin] Hives  . Amoxicillin     HOME MEDICATIONS: Outpatient Medications Prior to Visit  Medication Sig Dispense Refill  . acetaminophen (TYLENOL) 500 MG tablet Take 500 mg by mouth every 6 (six) hours as needed.    . calcium-vitamin D (OSCAL WITH D) 500-200 MG-UNIT per tablet Take 1 tablet by mouth daily.    . Cholecalciferol (VITAMIN D3) 2000 UNITS TABS Take 1 tablet by mouth daily.    Marland Kitchen co-enzyme Q-10 50 MG capsule Take 50 mg by mouth daily.    Marland Kitchen glucosamine-chondroitin 500-400 MG tablet Take 1 tablet by mouth 3 (three) times daily.    Marland Kitchen LORazepam (ATIVAN) 0.5 MG tablet Take 1 mg by mouth at bedtime as needed. Reported on 02/06/2016    . Magnesium 250 MG TABS Take 1.5 tablets by mouth daily.    . Omega 3 1000 MG CAPS Take 1 capsule by mouth daily.    . QUEtiapine (SEROQUEL) 300 MG tablet Take 450 mg by mouth at bedtime.     . Riboflavin (VITAMIN B-2) 25 MG TABS Take 1 capsule by mouth daily.    . Turmeric 500 MG CAPS Take 2 capsules by mouth daily.    Marland Kitchen UNABLE TO FIND Med Name: Suprema Dophilus    .  vitamin E 400 UNIT capsule Take 400 Units by mouth daily.    . metoprolol tartrate (LOPRESSOR) 25 MG tablet Take 25 mg by mouth 2 (two) times daily.  0  . doxycycline (DORYX) 100 MG EC tablet Take 100 mg by mouth.    . predniSONE (DELTASONE) 20 MG tablet Take one tablet twice a day for 5 days then once a day.     No facility-administered medications prior to visit.     PAST MEDICAL HISTORY: Past Medical History:  Diagnosis Date  . Arthritis   . Depression   . Dyslipidemia   . Hypertension   . Migraine   . Migraine without aura, without mention of intractable migraine without mention  of status migrainosus 04/13/2014  . Pituitary microadenoma ([place]) 04/13/2014  . Tremor 04/13/2014    PAST SURGICAL HISTORY: Past Surgical History:  Procedure Laterality Date  . CHOLECYSTECTOMY    . NASAL SINUS SURGERY      FAMILY HISTORY: Family History  Problem Relation Age of Onset  . Cancer Father     stomach cancer  . Migraines Mother     SOCIAL HISTORY: Social History   Social History  . Marital status: Married    Spouse name: N/A  . Number of children: 1  . Years of education: college   Occupational History  . Retired    Social History Main Topics  . Smoking status: Never Smoker  . Smokeless tobacco: Never Used  . Alcohol use 0.0 oz/week     Comment: occas.  . Drug use: No  . Sexual activity: Not on file   Other Topics Concern  . Not on file   Social History Narrative   Lives at home, married   Patient does not drink caffeine.   Patient is right handed.      PHYSICAL EXAM  Vitals:   07/31/16 1553  BP: (!) 158/108  Resp: 20  Weight: 204 lb (92.5 kg)  Height: 5\' 7"  (1.702 m)   Body mass index is 31.95 kg/m.  Generalized: Well developed, in no acute distress   Neurological examination  Mentation: Alert oriented to time, place, history taking. Follows all commands speech and language fluent Cranial nerve II-XII: Pupils were equal round reactive to light. Extraocular movements were full, visual field were full on confrontational test. Facial sensation and strength were normal. Uvula tongue midline. Head turning and shoulder shrug  were normal and symmetric. Motor: The motor testing reveals 5 over 5 strength of all 4 extremities. Good symmetric motor tone is noted throughout.  Sensory: Sensory testing is intact to soft touch on all 4 extremities. No evidence of extinction is noted.  Coordination: Cerebellar testing reveals good finger-nose-finger and heel-to-shin bilaterally.  Gait and station: Gait is normal. Tandem gait is normal. Romberg is  negative. No drift is seen.  Reflexes: Deep tendon reflexes are symmetric and normal bilaterally.   DIAGNOSTIC DATA (LABS, IMAGING, TESTING) - I reviewed patient records, labs, notes, testing and imaging myself where available.  Lab Results  Component Value Date   WBC 7.9 03/27/2016   HGB 13.4 03/27/2016   HCT 39.5 03/27/2016   MCV 91.2 03/27/2016   PLT 242 03/27/2016      Component Value Date/Time   NA 135 03/27/2016 1445   NA 142 11/01/2015 1232   K 4.1 03/27/2016 1445   CL 98 (L) 03/27/2016 1445   CO2 27 03/27/2016 1445   GLUCOSE 135 (H) 03/27/2016 1445   BUN 19 03/27/2016 1445  BUN 29 (H) 11/01/2015 1232   CREATININE 1.04 (H) 03/27/2016 1445   CALCIUM 9.4 03/27/2016 1445   PROT 6.5 11/01/2015 1232   ALBUMIN 4.5 11/01/2015 1232   AST 28 11/01/2015 1232   ALT 37 (H) 11/01/2015 1232   ALKPHOS 91 11/01/2015 1232   BILITOT 0.3 11/01/2015 1232   GFRNONAA 55 (L) 03/27/2016 1445   GFRAA >60 03/27/2016 1445    No results found for: HGBA1C Lab Results  Component Value Date   VITAMINB12 338 11/01/2015   Lab Results  Component Value Date   TSH 0.969 11/01/2015      ASSESSMENT AND PLAN 65 y.o. year old female  has a past medical history of Arthritis; Depression; Dyslipidemia; Hypertension; Migraine; Migraine without aura, without mention of intractable migraine without mention of status migrainosus (04/13/2014); Pituitary microadenoma ([place]) (04/13/2014); and Tremor (04/13/2014). here with:  1. Headache  The patient has had a dull headache essentially for the last 2 months. She has tried and failed multiple medications for migraines. I will try a prednisone Dosepak. This hopefully will break her headache cycle. If not we will retry Topamax. Patient is amenable to this plan. In the future we may also consider a sleep evaluation. She will follow-up in 2 months or sooner if needed.     Ward Givens, MSN, NP-C 07/31/2016, 5:03 PM [facility] [address], [place], [place] [address] [phone]

## 2016-08-13 ENCOUNTER — Telehealth: Payer: Self-pay | Admitting: Adult Health

## 2016-08-13 NOTE — Telephone Encounter (Signed)
Patient called to advise, still having headaches after steroids states she saw NP, [facility] and was told to call back if she is still having headaches. Please call [phone] to advise.

## 2016-08-13 NOTE — Telephone Encounter (Signed)
Spoke to pt and she stated that the prednisone dosepack did not help at all.  She still is at a dull pain not every day but several days a week.  She is willing to try topamax. Please advise.

## 2016-08-14 MED ORDER — TOPIRAMATE 25 MG PO TABS: ORAL_TABLET | ORAL | 11 refills | Status: DC

## 2016-08-14 NOTE — Telephone Encounter (Signed)
I'll start the patient on Topamax. A prescription has been called in. Please review instructions with the patient. She will begin by taking Take 1 tablet PO at bedtime for 1 week then 2 tablets PO at bedtime for 1 week then  3 tablets at bedtime thereafter.

## 2016-08-14 NOTE — Telephone Encounter (Signed)
Pt returned call. Nurse took call

## 2016-08-14 NOTE — Telephone Encounter (Signed)
Rn call patient about instructions for topamax that was sent to the pharmacy. Pt's husband stated she was still sleeping and he did not want to wake her up. Rn stated pt call about having headaches to our office. Rn advise pts husband that she needs to call our office when she wakes up. Rn stated there are important instructions on how to take the topamax medication. Pts husband verbalized understanding.

## 2016-08-14 NOTE — Telephone Encounter (Addendum)
Rn receive incoming call from patient about topamax medication.Rn stated the medication was sent to the pharmacy. Rn gave pt the directions per [facility] NP note. Rn requested pt to call back if she has any issues. Pt verbalized understanding.

## 2016-08-21 MED ORDER — BACLOFEN 10 MG PO TABS: 5.0000 mg | ORAL_TABLET | Freq: Three times a day (TID) | ORAL | 2 refills | Status: DC

## 2016-08-21 NOTE — Telephone Encounter (Signed)
I called the patient. Her headaches had been daily before in the past, they went away spontaneously in November 2016, but over the last 2 months they have returned again and are daily again.  In the past, the patient has been on a multitude of medications without benefit. She is getting back on Topamax, she is on Seroquel, a prednisone Dosepak recently did not help.  I will try baclofen 5 mg 3 times daily. The patient still getting up on the Topamax dose.

## 2016-08-21 NOTE — Telephone Encounter (Signed)
Pt called in with a migraine/headache every day for several weeks now. She says she is taking topamax as instructed. Says she is taking tramadol 50 mg 2 x day. Pt would like to know what else she could do for the pain. Please call and advise [phone]

## 2016-08-21 NOTE — Telephone Encounter (Signed)
Pt saw Megan NP for HA on 07/31/16. OV notes mention that she "has had a dull headache essentially for the last 2 months. She has tried and failed multiple medications for migraines. I will try a prednisone Dosepak." Pt called w/ ongoing HA despite steroids and was started on Topamax last week.

## 2016-08-21 NOTE — Telephone Encounter (Signed)
Pt called back, said she has not heard back from RN. I advised her the msg had been sent to Dr Viona Gilmore and is waiting for his decision. Someone would call her.  She is concerned the pharmacy will be closed tomorrow

## 2016-08-21 NOTE — Addendum Note (Signed)
Addended by: Margette Fast on: 08/21/2016 05:34 PM   Modules accepted: Orders

## 2016-08-29 MED ORDER — METHOCARBAMOL 500 MG PO TABS: 500.0000 mg | ORAL_TABLET | Freq: Three times a day (TID) | ORAL | 0 refills | Status: DC

## 2016-08-29 NOTE — Telephone Encounter (Signed)
Patient is calling back saying she is not getting any relief from the Baclofen for migraines. It is getting harder to walk and she is dizzy. Does she need an MRI?

## 2016-08-29 NOTE — Addendum Note (Signed)
Addended by: Margette Fast on: 08/29/2016 10:55 AM   Modules accepted: Orders

## 2016-08-29 NOTE — Telephone Encounter (Signed)
I called patient. Headaches are ongoing. Baclofen is not helpful.  In the past, Botox, Sphenocath injections have not been effective. The patient's been on a multitude of medications without benefit.  A recent trial on prednisone has not been helpful. She remains on Topamax to 75 mg at night.  The patient reports some neck stiffness and shoulder discomfort with the headache as well. The patient will go off of the baclofen, we will try a ten-day course of Robaxin.  The patient is also on a beta blocker. She is continuing with the Seroquel.

## 2016-09-09 ENCOUNTER — Telehealth: Payer: Self-pay

## 2016-09-09 NOTE — Telephone Encounter (Signed)
Methocarbamol is not helping pain at all . Please call pt at [phone]  ASAP

## 2016-09-10 ENCOUNTER — Telehealth: Payer: Self-pay | Admitting: Neurology

## 2016-09-10 MED ORDER — ZONISAMIDE 50 MG PO CAPS: ORAL_CAPSULE | ORAL | 2 refills | Status: DC

## 2016-09-10 NOTE — Telephone Encounter (Signed)
Pt called said she called yesterday but did not receive a return call, I could not find any documentation. She is having a daily migraine, medication prescribed did not work. Please call

## 2016-09-10 NOTE — Telephone Encounter (Signed)
Pt c/o ongoing headaches despite use of methocarbamol. She's also on Seroquel as well as Topamax and has also tried baclofen, Botox, Spenocath and prednisone w/o much benefit in the past.

## 2016-09-10 NOTE — Telephone Encounter (Signed)
I called patient. The patient gained no benefit from the Robaxin, in the past Topamax has not helped her headaches. The patient will be taken off of Topamax and switched to [place]. A prescription was called in.

## 2016-10-01 ENCOUNTER — Emergency Department (HOSPITAL_BASED_OUTPATIENT_CLINIC_OR_DEPARTMENT_OTHER): Payer: Medicare Other

## 2016-10-01 ENCOUNTER — Encounter (HOSPITAL_BASED_OUTPATIENT_CLINIC_OR_DEPARTMENT_OTHER): Payer: Self-pay | Admitting: *Deleted

## 2016-10-01 ENCOUNTER — Emergency Department (HOSPITAL_BASED_OUTPATIENT_CLINIC_OR_DEPARTMENT_OTHER)
Admission: EM | Admit: 2016-10-01 | Discharge: 2016-10-01 | Disposition: A | Discharge status: Home / Self Care | Payer: Medicare Other | Attending: Physician Assistant | Admitting: Physician Assistant

## 2016-10-01 DIAGNOSIS — I10 Essential (primary) hypertension: Secondary | ICD-10-CM | POA: Insufficient documentation

## 2016-10-01 DIAGNOSIS — Z79899 Other long term (current) drug therapy: Secondary | ICD-10-CM | POA: Insufficient documentation

## 2016-10-01 DIAGNOSIS — S99911A Unspecified injury of right ankle, initial encounter: Secondary | ICD-10-CM | POA: Diagnosis present

## 2016-10-01 DIAGNOSIS — S82831A Other fracture of upper and lower end of right fibula, initial encounter for closed fracture: Secondary | ICD-10-CM | POA: Diagnosis not present

## 2016-10-01 DIAGNOSIS — Y999 Unspecified external cause status: Secondary | ICD-10-CM | POA: Insufficient documentation

## 2016-10-01 DIAGNOSIS — Y92000 Kitchen of unspecified non-institutional (private) residence as  the place of occurrence of the external cause: Secondary | ICD-10-CM | POA: Insufficient documentation

## 2016-10-01 DIAGNOSIS — W010XXA Fall on same level from slipping, tripping and stumbling without subsequent striking against object, initial encounter: Secondary | ICD-10-CM | POA: Insufficient documentation

## 2016-10-01 DIAGNOSIS — Y939 Activity, unspecified: Secondary | ICD-10-CM | POA: Diagnosis not present

## 2016-10-01 IMAGING — CR DG ANKLE COMPLETE 3+V*R*
3 series · 3 of 3 positions shown · non-contrast
Comparison: None.

CLINICAL DATA: Pain following fall

EXAM:
RIGHT ANKLE - COMPLETE 3+ VIEW

[t ankle joint ap right]
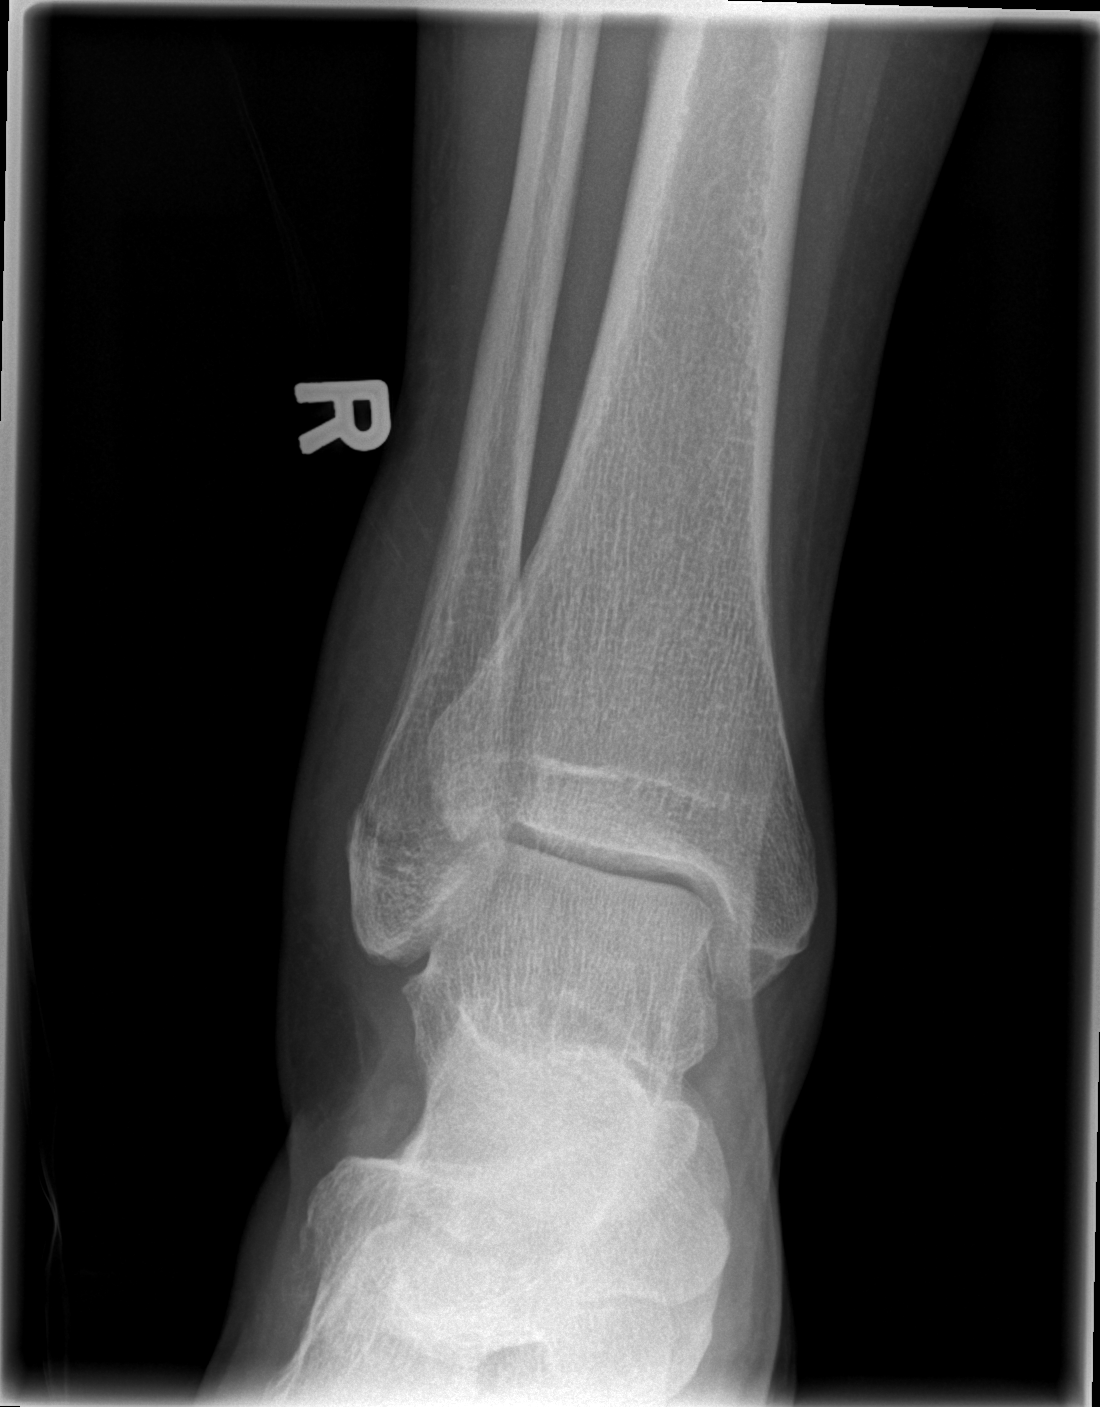

[t ankle joint oblique right]
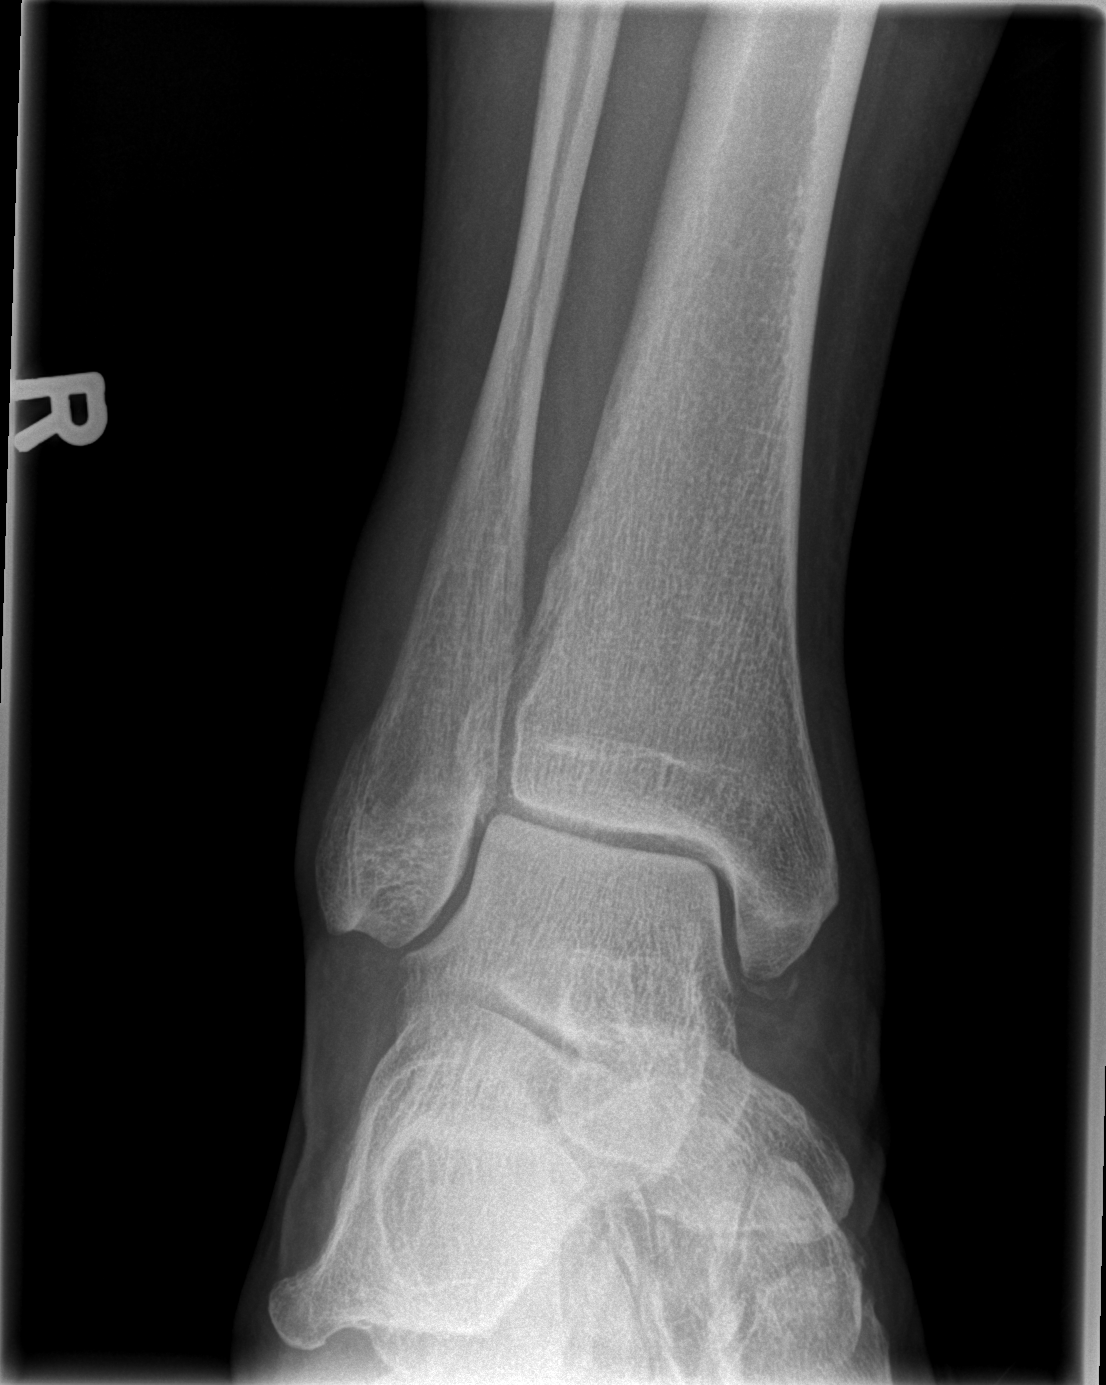

[t ankle joint lat right]
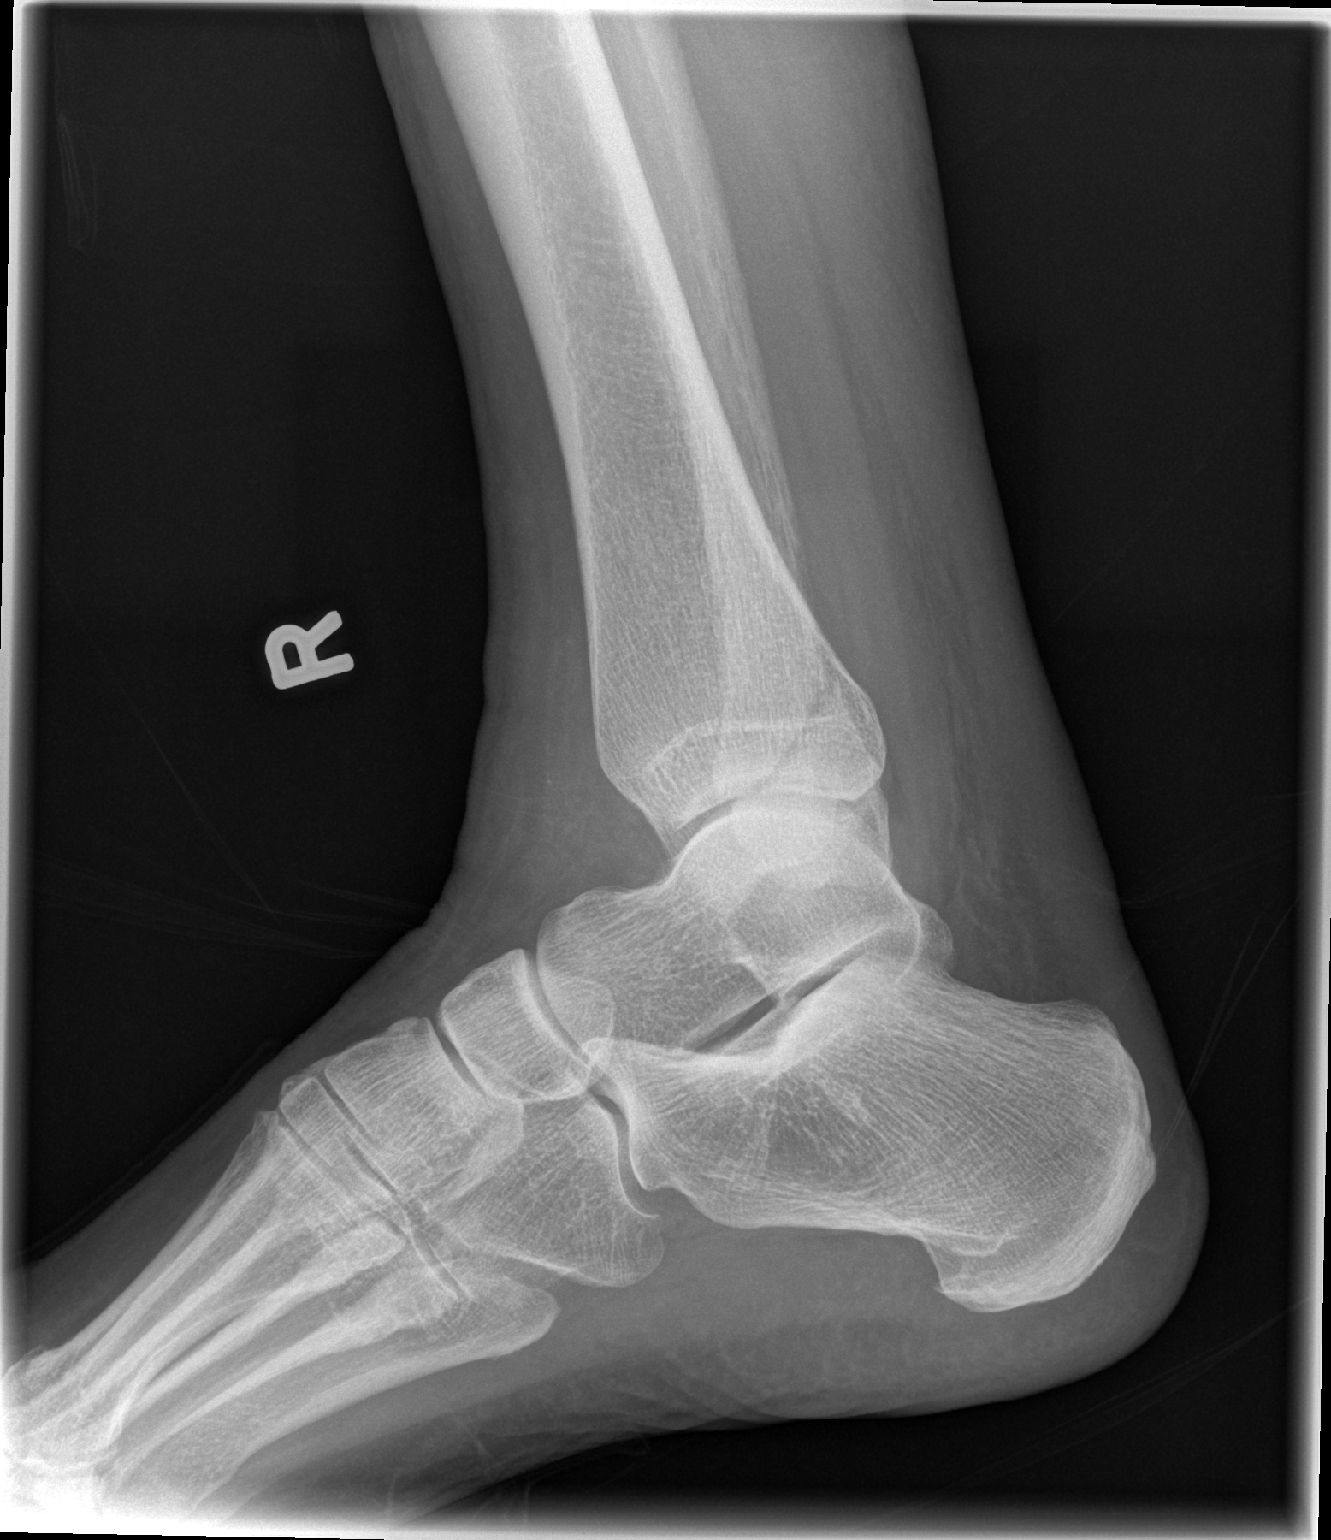

[3 of 3 positions shown; findings below may reference images not displayed]

FINDINGS: Frontal, oblique, and lateral views were obtained. There is soft
tissue swelling laterally. There is a fracture at the level of the
distal fibular metaphysis with alignment essentially anatomic. There
is no appreciable joint effusion. The ankle mortise appears intact.
No other evidence fracture.
IMPRESSION: Fracture distal fibula with alignment essentially anatomic. Soft
tissue swelling laterally. Ankle mortise appears intact.

## 2016-10-01 IMAGING — DX DG TIBIA/FIBULA 2V*R*
4 series · 4 of 4 positions shown · non-contrast
Comparison: Plain film of the right ankle from earlier today.

CLINICAL DATA: Status post fall today, right tib-fib pain.

EXAM:
RIGHT TIBIA AND FIBULA - 2 VIEW

[tibia ap (1 of 2)]
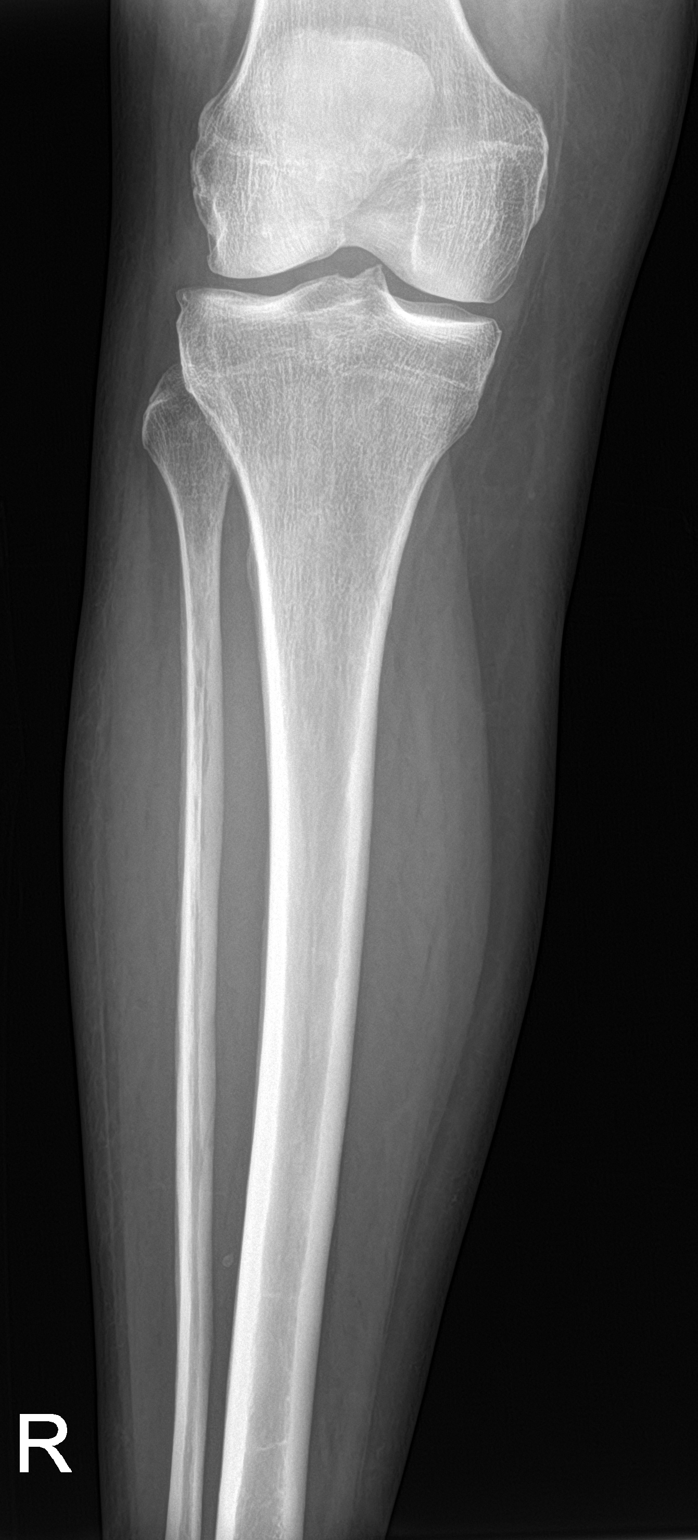

[tibia ap (2 of 2)]
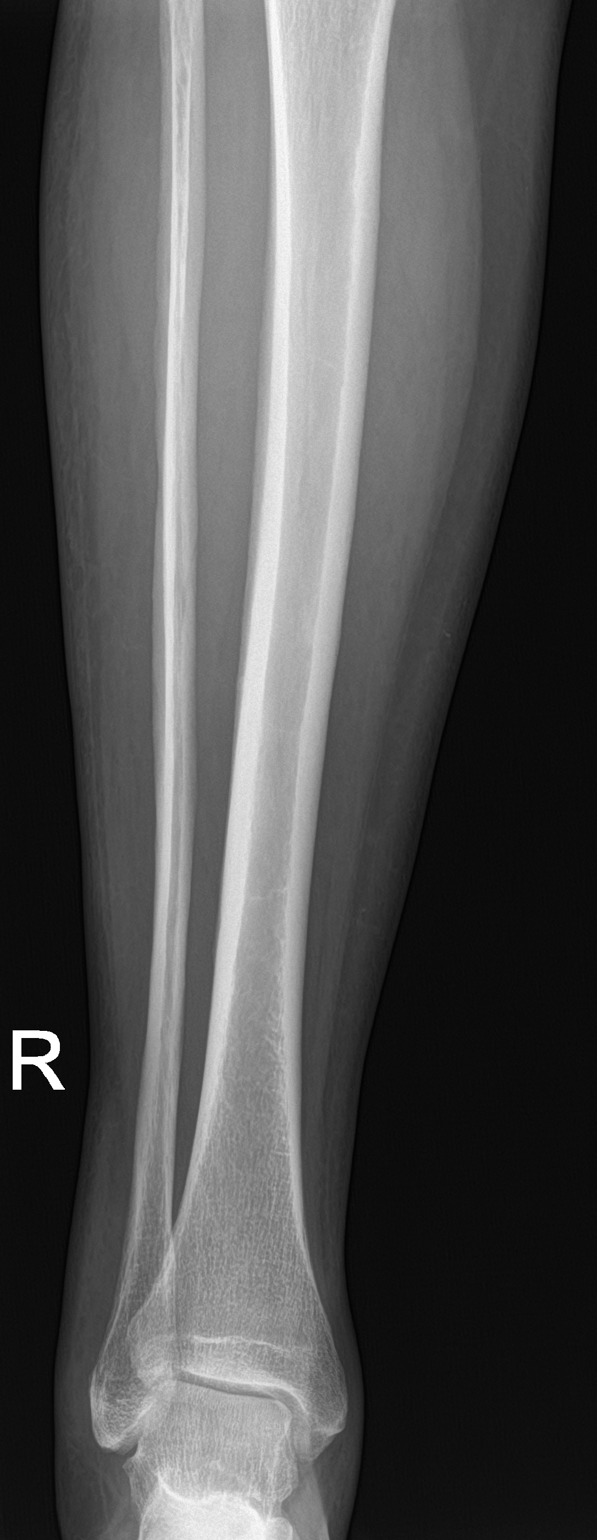

[tibia lat (1 of 2)]
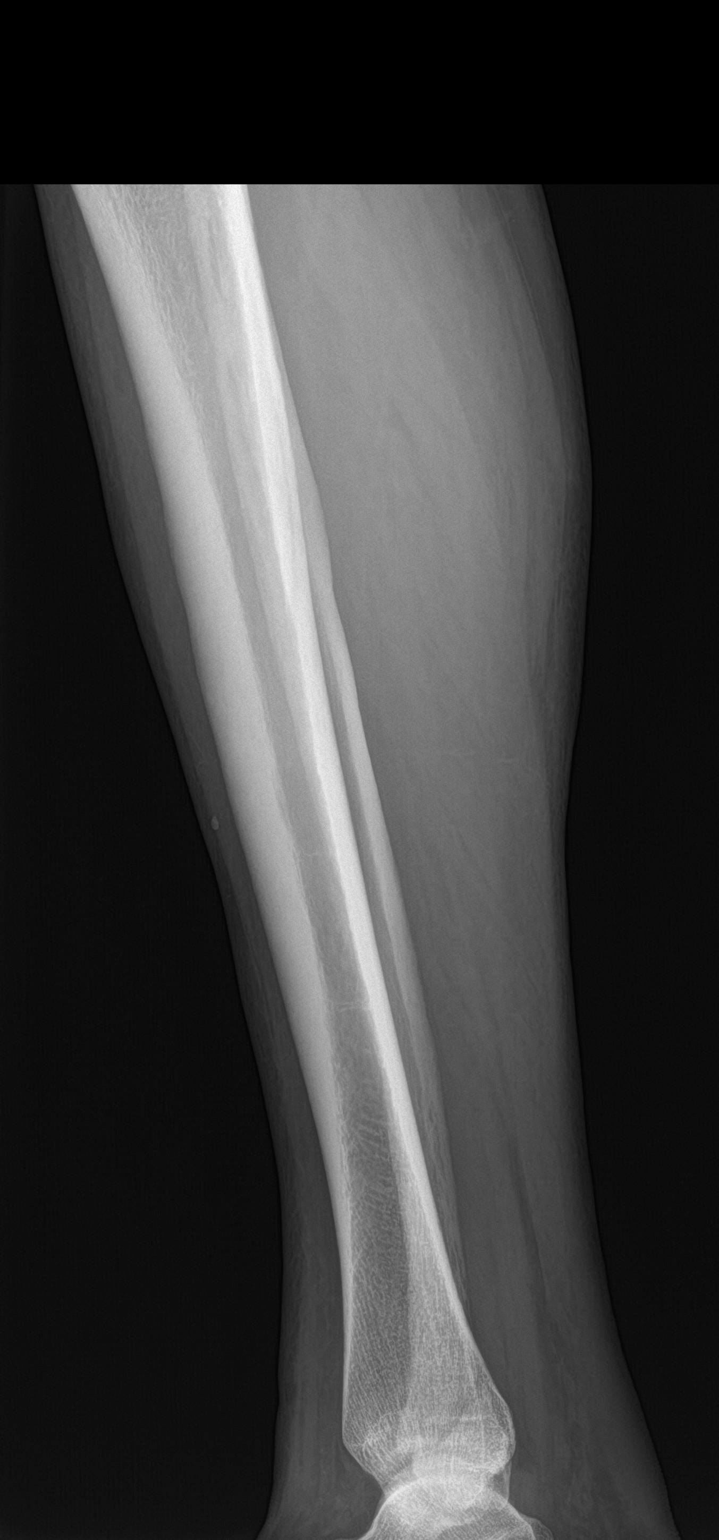

[tibia lat (2 of 2)]
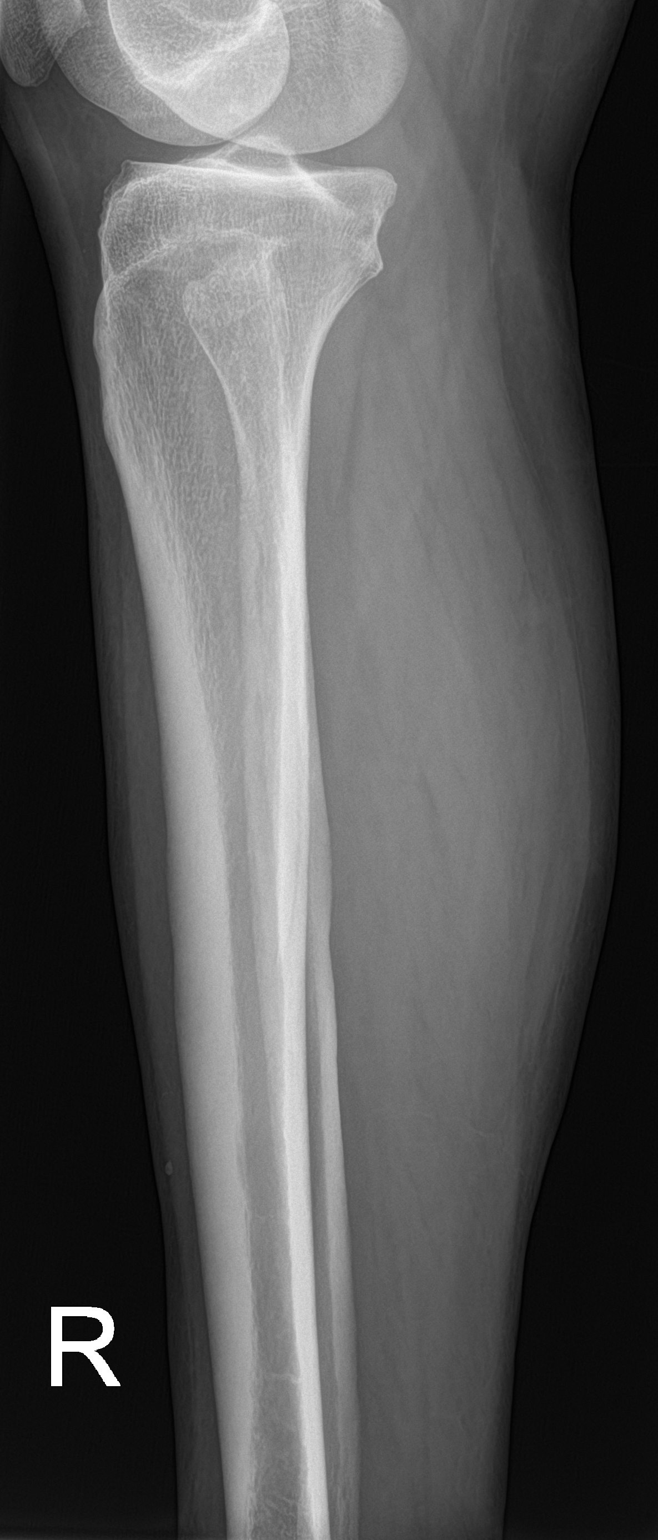

[4 of 4 positions shown; findings below may reference images not displayed]

FINDINGS: Again noted is the nondisplaced fracture within the distal right
fibula. Remainder of the right fibula appears intact and normally
aligned. Right tibia appears intact and normally aligned. Ankle
mortise is symmetric. Adjacent soft tissues are unremarkable.
IMPRESSION: Nondisplaced fracture within the distal right fibula, better
demonstrated on earlier plain film examination of the right ankle.

No additional fractures identified.

## 2016-10-01 MED ORDER — OXYCODONE-ACETAMINOPHEN 5-325 MG PO TABS: 1.0000 | ORAL_TABLET | Freq: Four times a day (QID) | ORAL | 0 refills | Status: DC | PRN

## 2016-10-01 MED ORDER — OXYCODONE-ACETAMINOPHEN 5-325 MG PO TABS
1.0000 | ORAL_TABLET | Freq: Once | ORAL | Status: AC
  Administered 2016-10-01: 1 via ORAL
  Filled 2016-10-01: qty 1

## 2016-10-01 NOTE — Discharge Instructions (Signed)
You may either used orthopedist that is on call here or your orthopedist. Please do not bear any weight, and use crutches and use pain medication prescribed until follow-up.

## 2016-10-01 NOTE — ED Notes (Signed)
Pt is very concerned that she will have a long wait to see a doctor with all the patients in the waiting area.

## 2016-10-01 NOTE — ED Provider Notes (Signed)
[facility] Provider Note   CSN: [id number] Arrival date & time: 10/01/16  1450  By signing my name below, I, Avnee Patel, attest that this documentation has been prepared under the direction and in the presence of Shep Porter Julio Alm, MD  Electronically Signed: Delton Prairie, ED Scribe. 10/01/16. 6:42 PM.   History   Chief Complaint Chief Complaint  Patient presents with  . Ankle Injury   The history is provided by the patient. No language interpreter was used.   HPI Comments:  Kristin Gilmore is a 66 y.o. female who presents to the Emergency Department complaining of sudden onset, moderate right ankle pain s/p a fall which occurred earlier today. Pt states she slipped on a wet kitchen floor causing her to twist her ankle and sustain her injury. She also reports joint swelling and right knee pain. Her pain is worse when bearing weight and upon palpation. No alleviating factors noted. Pt denies head injury, right hip pain, any other associated symptoms and any other modifying factors at this time.   Pt would prefer to see Dr. Maxie Better or Dr. Governor Rooks for orthopedist following.   Past Medical History:  Diagnosis Date  . Arthritis   . Depression   . Dyslipidemia   . Hypertension   . Migraine   . Migraine without aura, without mention of intractable migraine without mention of status migrainosus 04/13/2014  . Pituitary microadenoma ([place]) 04/13/2014  . Tremor 04/13/2014    Patient Active Problem List   Diagnosis Date Noted  . Pain in joint, ankle and foot 05/02/2016  . Chronic venous insufficiency 05/02/2016  . Peripheral edema 11/01/2015  . Intractable chronic migraine without aura 11/02/2014  . Tremor 04/13/2014  . Migraine without aura 04/13/2014  . Pituitary microadenoma ([place]) 04/13/2014    Past Surgical History:  Procedure Laterality Date  . CHOLECYSTECTOMY    . NASAL SINUS SURGERY      OB History    No data available       Home Medications     Prior to Admission medications   Medication Sig Start Date End Date Taking? Authorizing Provider  acetaminophen (TYLENOL) 500 MG tablet Take 500 mg by mouth every 6 (six) hours as needed.    Historical Provider, MD  calcium-vitamin D (OSCAL WITH D) 500-200 MG-UNIT per tablet Take 1 tablet by mouth daily.    Historical Provider, MD  Cholecalciferol (VITAMIN D3) 2000 UNITS TABS Take 1 tablet by mouth daily.    Historical Provider, MD  co-enzyme Q-10 50 MG capsule Take 50 mg by mouth daily.    Historical Provider, MD  glucosamine-chondroitin 500-400 MG tablet Take 1 tablet by mouth 3 (three) times daily.    Historical Provider, MD  LORazepam (ATIVAN) 0.5 MG tablet Take 1 mg by mouth at bedtime as needed. Reported on 02/06/2016 03/22/14   Historical Provider, MD  Magnesium 250 MG TABS Take 1.5 tablets by mouth daily.    Historical Provider, MD  metoprolol (LOPRESSOR) 50 MG tablet Take 50 mg by mouth 2 (two) times daily.    Historical Provider, MD  Omega 3 1000 MG CAPS Take 1 capsule by mouth daily.    Historical Provider, MD  predniSONE (DELTASONE) 5 MG tablet Begin taking 6 tablets daily, taper by one tablet daily until off the medication. 07/31/16   Ward Givens, NP  QUEtiapine (SEROQUEL) 300 MG tablet Take 450 mg by mouth at bedtime.     Historical Provider, MD  Riboflavin (VITAMIN B-2) 25 MG TABS  Take 1 capsule by mouth daily.    Historical Provider, MD  traMADol (ULTRAM) 50 MG tablet Take 50 mg by mouth every 12 (twelve) hours as needed.    Historical Provider, MD  Turmeric 500 MG CAPS Take 2 capsules by mouth daily.    Historical Provider, MD  [place] Name: Suprema Dophilus    Historical Provider, MD  vitamin E 400 UNIT capsule Take 400 Units by mouth daily.    Historical Provider, MD  zonisamide (ZONEGRAN) 50 MG capsule One at night for one week, then take 1 twice a day for one week, then take 1 in the morning and 2 in the evening 09/10/16   Kathrynn Ducking, MD    Family  History Family History  Problem Relation Age of Onset  . Cancer Father     stomach cancer  . Migraines Mother     Social History Social History  Substance Use Topics  . Smoking status: Never Smoker  . Smokeless tobacco: Never Used  . Alcohol use 0.0 oz/week     Comment: occas.     Allergies   Biaxin [clarithromycin] and Amoxicillin   Review of Systems Review of Systems  Musculoskeletal: Positive for arthralgias and myalgias.  Neurological: Negative for syncope, numbness and headaches.  All other systems reviewed and are negative.  Physical Exam Updated Vital Signs BP 183/96   Pulse 78   Temp 98.2 F (36.8 C) (Oral)   Resp 18   SpO2 99%   Physical Exam  Constitutional: She is oriented to person, place, and time. She appears well-developed and well-nourished. No distress.  HENT:  Head: Normocephalic and atraumatic.  Eyes: Conjunctivae are normal.  Cardiovascular: Normal rate.   Pulmonary/Chest: Effort normal.  Abdominal: She exhibits no distension.  Musculoskeletal: She exhibits edema.  Swelling to the lateral aspect of the right lower extremity. Good pulses and sensation intact. Tenderness to the distal part of the knee on the medial side.   Neurological: She is alert and oriented to person, place, and time.  Skin: Skin is warm and dry.  Psychiatric: She has a normal mood and affect.  Nursing note and vitals reviewed.  ED Treatments / Results  DIAGNOSTIC STUDIES:  Oxygen Saturation is 99% on RA, normal by my interpretation.    COORDINATION OF CARE:  6:37 PM Discussed treatment plan with pt at bedside and pt agreed to plan.  Labs (all labs ordered are listed, but only abnormal results are displayed) Labs Reviewed - No data to display  EKG  EKG Interpretation None       Radiology Dg Ankle Complete Right  Result Date: 10/01/2016 CLINICAL DATA:  Pain following fall EXAM: RIGHT ANKLE - COMPLETE 3+ VIEW COMPARISON:  None. FINDINGS: Frontal, oblique,  and lateral views were obtained. There is soft tissue swelling laterally. There is a fracture at the level of the distal fibular metaphysis with alignment essentially anatomic. There is no appreciable joint effusion. The ankle mortise appears intact. No other evidence fracture. IMPRESSION: Fracture distal fibula with alignment essentially anatomic. Soft tissue swelling laterally. Ankle mortise appears intact. Electronically Signed   By: Lowella Grip III M.D.   On: 10/01/2016 15:53    Procedures Procedures (including critical care time)  Medications Ordered in ED Medications - No data to display   Initial Impression / Assessment and Plan / ED Course  I have reviewed the triage vital signs and the nursing notes.  Pertinent labs & imaging results that were available during  my care of the patient were reviewed by me and considered in my medical decision making (see chart for details).  Clinical Course     Pt here with mechanical fall and fracture. Pt has her own ortho to follow up with. Splinted, pt ambulates with crutches. Pain meds given.   I personally performed the services described in this documentation, which was scribed in my presence. The recorded information has been reviewed and is accurate.     Final Clinical Impressions(s) / ED Diagnoses   Final diagnoses:  None    New Prescriptions New Prescriptions   No medications on file      Wayburn Shaler Julio Alm, MD 10/02/16 2346

## 2016-10-15 ENCOUNTER — Ambulatory Visit: Payer: Medicare Other | Admitting: Adult Health

## 2017-03-10 ENCOUNTER — Telehealth: Payer: Self-pay | Admitting: Neurology

## 2017-03-10 NOTE — Telephone Encounter (Addendum)
Called and spoke with pt. She states tremors in left hand has worsened. Scheduled appt for 03/17/17 at 11am, check in 1045am. She would like to be called if an afternoon appt opens next week.  Advised I will write her name down and call if there are any cx. She verbalized understanding.

## 2017-03-10 NOTE — Telephone Encounter (Signed)
Patient would like to see Dr. Jannifer Franklin soon for tremors. Dr. Jannifer Franklin had an opening tomorrow which I offered her but she cannot come. She does not want to see the NP.

## 2017-03-14 NOTE — Telephone Encounter (Signed)
Changed appt to 03/18/17 at 4pm per pt request.

## 2017-03-14 NOTE — Telephone Encounter (Signed)
Pt would like to accept, please change

## 2017-03-14 NOTE — Telephone Encounter (Signed)
Called and LVM for pt to call. There is an afternoon appt open 03/18/17 at 4pm, check in 330pm if she wants to r/s to this appt instead. Asked her to call back before 12pm and let me know if she wants this.

## 2017-03-17 ENCOUNTER — Ambulatory Visit: Payer: Self-pay | Admitting: Neurology

## 2017-03-18 ENCOUNTER — Ambulatory Visit: Payer: Self-pay | Admitting: Neurology

## 2017-03-18 ENCOUNTER — Telehealth: Payer: Self-pay | Admitting: Neurology

## 2017-03-18 NOTE — Telephone Encounter (Signed)
Patient called office states she can not come to the appointment today with Dr. Jannifer Franklin due to increase in tremors and feels terrible.  Patient would like to reschedule, but Im not to sure where to work the patient in.  Please call

## 2017-03-18 NOTE — Telephone Encounter (Signed)
Dr Willis- please advise 

## 2017-03-18 NOTE — Telephone Encounter (Signed)
This patient did not show for a RV appointment today. 

## 2017-03-19 ENCOUNTER — Encounter: Payer: Self-pay | Admitting: Neurology

## 2017-03-19 NOTE — Telephone Encounter (Signed)
Called and LVM for pt to call and schedule appt.   *Can offer appt on 03/31/17 (EMG slot). Ff she calls, please schedule

## 2017-03-20 ENCOUNTER — Telehealth: Payer: Self-pay | Admitting: Neurology

## 2017-03-20 NOTE — Telephone Encounter (Signed)
Noted, thank you

## 2017-03-20 NOTE — Telephone Encounter (Signed)
Pt has called back in and agreed to accept an appointment that RN Terrence Dupont was able to arrange for her 03-31-2017 checking in at 1:00 for a 1:30 appointment

## 2017-03-20 NOTE — Telephone Encounter (Signed)
Pt called to schedule an appointment with Dr Jannifer Franklin due to tremors worsening.  Pt was unable to stay on phone so asked to be scheduled for a late appointment and then be put on wait list.  Pt has been scheduled for 1st available appoinment (which is in January) pt has been put on wait list.  I called pt back to inform, no one avail, no voicemail.

## 2017-03-31 ENCOUNTER — Ambulatory Visit: Payer: Medicare Other | Admitting: Neurology

## 2017-03-31 ENCOUNTER — Telehealth: Payer: Self-pay | Admitting: *Deleted

## 2017-03-31 NOTE — Telephone Encounter (Signed)
Patient call office this morning to cancel her 1:30pm appt - states she had to call EMS late last night and was advised to go see her PCP.

## 2017-04-04 ENCOUNTER — Telehealth: Payer: Self-pay

## 2017-04-04 NOTE — Telephone Encounter (Signed)
Sent notes to scheduling 

## 2017-04-08 ENCOUNTER — Encounter: Payer: Self-pay | Admitting: Cardiology

## 2017-04-08 ENCOUNTER — Ambulatory Visit (INDEPENDENT_AMBULATORY_CARE_PROVIDER_SITE_OTHER): Payer: Medicare Other | Admitting: Cardiology

## 2017-04-08 VITALS — BP 148/90 | HR 70 | Ht 67.0 in | Wt 204.6 lb

## 2017-04-08 DIAGNOSIS — R079 Chest pain, unspecified: Secondary | ICD-10-CM | POA: Diagnosis not present

## 2017-04-08 DIAGNOSIS — I10 Essential (primary) hypertension: Secondary | ICD-10-CM | POA: Diagnosis not present

## 2017-04-08 NOTE — Progress Notes (Signed)
Electrophysiology Office Note   Date:  04/08/2017   ID:  Sherese Heyward Borthwick, DOB [birth year], MRN [id number]  PCP:  Aletha Halim., PA-C  Cardiologist:   Constance Haw, MD    Chief Complaint  Patient presents with  . Follow-up    Chest pain     History of Present Illness: Kristin Gilmore is a 66 y.o. female who presents today for cardiology evaluation.  She is presenting today with chest pain. The onset was sudden. The pain woke her up and was midsternal. She called EMS when she would not go to the emergency room. She has not had any further episodes of chest pain. The pain was 10 out of 10 in intensity described as stabbing. It did not radiate but was associated with shortness of breath. She chewed one baby aspirin and the pain stopped. She has not had any pain since that time. She did have lab work done the next day her primary physician's office showed a negative troponin. She says that the pain lasted about 20-25 minutes.  Today, denies symptoms of palpitations, chest pain, shortness of breath, orthopnea, PND, lower extremity edema, claudication, dizziness, presyncope, syncope, bleeding, or neurologic sequela. The patient is tolerating medications without difficulties and is otherwise without complaint today.     Past Medical History:  Diagnosis Date  . Arthritis   . Depression   . Dyslipidemia   . Hypertension   . Migraine   . Migraine without aura, without mention of intractable migraine without mention of status migrainosus 04/13/2014  . Pituitary microadenoma ([place]) 04/13/2014  . Tremor 04/13/2014   Past Surgical History:  Procedure Laterality Date  . CHOLECYSTECTOMY    . NASAL SINUS SURGERY       Current Outpatient Prescriptions  Medication Sig Dispense Refill  . acetaminophen (TYLENOL) 500 MG tablet Take 500 mg by mouth every 6 (six) hours as needed.    . calcium-vitamin D (OSCAL WITH D) 500-200 MG-UNIT per tablet Take 1 tablet by mouth daily.    .  Cholecalciferol (VITAMIN D3) 2000 UNITS TABS Take 1 tablet by mouth daily.    Marland Kitchen co-enzyme Q-10 50 MG capsule Take 50 mg by mouth daily.    Marland Kitchen glucosamine-chondroitin 500-400 MG tablet Take 1 tablet by mouth 3 (three) times daily.    . Magnesium 250 MG TABS Take 1.5 tablets by mouth daily.    . metoprolol (LOPRESSOR) 50 MG tablet Take 50 mg by mouth 2 (two) times daily.    . mirabegron ER (MYRBETRIQ) 25 MG TB24 tablet Take 25 mg by mouth daily.    . Omega 3 1000 MG CAPS Take 1 capsule by mouth daily.    Marland Kitchen omeprazole (PRILOSEC) 40 MG capsule Take 40 mg by mouth daily.    . QUEtiapine (SEROQUEL) 300 MG tablet Take 150 mg by mouth at bedtime.     . Riboflavin (VITAMIN B-2) 25 MG TABS Take 1 capsule by mouth daily.    . Turmeric 500 MG CAPS Take 2 capsules by mouth daily.    Marland Kitchen UNABLE TO FIND Med Name: Suprema Dophilus    . vitamin E 400 UNIT capsule Take 400 Units by mouth daily.     No current facility-administered medications for this visit.     Allergies:   Biaxin [clarithromycin] and Amoxicillin   Social History:  The patient  reports that she has never smoked. She has never used smokeless tobacco. She reports that she drinks alcohol. She reports that she does  not use drugs.   Family History:  The patient's family history includes Cancer in her father; Migraines in her mother.    ROS:  Please see the history of present illness.   Otherwise, review of systems is positive for chest pain, abdominal pain, diarrhea.   All other systems are reviewed and negative.     PHYSICAL EXAM: VS:  BP (!) 148/90   Pulse 70   Ht 5\' 7"  (1.702 m)   Wt 204 lb 9.6 oz (92.8 kg)   BMI 32.04 kg/m  , BMI Body mass index is 32.04 kg/m. GEN: Well nourished, well developed, in no acute distress  HEENT: normal  Neck: no JVD, carotid bruits, or masses Cardiac: RRR; no murmurs, rubs, or gallops,no edema  Respiratory:  clear to auscultation bilaterally, normal work of breathing GI: soft, nontender,  nondistended, + BS MS: no deformity or atrophy  Skin: warm and dry Neuro:  Strength and sensation are intact Psych: euthymic mood, full affect  EKG:  EKG is ordered today. Personal review of the ekg ordered shows sinus rhythm, lateral TWI (old)   Recent Labs: No results found for requested labs within last 8760 hours.    Lipid Panel     Component Value Date/Time   CHOL  04/07/2008 0630    166        ATP III CLASSIFICATION:  <200     mg/dL   Desirable  200-239  mg/dL   Borderline High  >=240    mg/dL   High   TRIG 86 04/07/2008 0630   HDL 64 04/07/2008 0630   CHOLHDL 2.6 04/07/2008 0630   VLDL 17 04/07/2008 0630   LDLCALC  04/07/2008 0630    85        Total Cholesterol/HDL:CHD Risk Coronary Heart Disease Risk Table                     Men   Women  1/2 Average Risk   3.4   3.3     Wt Readings from Last 3 Encounters:  04/08/17 204 lb 9.6 oz (92.8 kg)  07/31/16 204 lb (92.5 kg)  06/06/16 199 lb 8 oz (90.5 kg)   TTE 11/23/15 - Left ventricle: The cavity size was normal. Wall thickness was   normal. Systolic function was normal. The estimated ejection   fraction was in the range of 55% to 60%. Wall motion was normal;   there were no regional wall motion abnormalities. Doppler   parameters are consistent with both elevated ventricular   end-diastolic filling pressure and elevated left atrial filling   pressure. - Left atrium: The atrium was mildly dilated. - Atrial septum: No defect or patent foramen ovale was identified.  ASSESSMENT AND PLAN:  1.  Chest pain: At this point, it is unclear as to the cause of chest pain. It is atypical in that it was not due to exertion, and with no oral troponins the next day, it is unlikely that this is cardiac in nature. That being said, she could have an atypical cause of chest pain. We'll order a rest stress Myoview for further risk stratification.  2. Hypertension: Blood pressure is elevated today, and she says that he has been  elevated in the past. We'll not make any changes today to her medications, which can be adjusted by her primary physician. It is possible that carvedilol Quadry Kampa help to improve her blood pressure more than metoprolol. She does say that she would prefer her therapy  through her primary physician.    Current medicines are reviewed at length with the patient today.   The patient does not have concerns regarding her medicines.  The following changes were made today:  none  Labs/ tests ordered today include:  No orders of the defined types were placed in this encounter.    Disposition:   FU with Romir Klimowicz pending stress testing  Signed, Belford Pascucci Meredith Leeds, MD  04/08/2017 3:01 PM     [address] [place] [address] [phone] (office) [phone] (fax)

## 2017-04-08 NOTE — Patient Instructions (Signed)
Medication Instructions:    Your physician recommends that you continue on your current medications as directed. Please refer to the Current Medication list given to you today.  - If you need a refill on your cardiac medications before your next appointment, please call your pharmacy.   Labwork:  None ordered  Testing/Procedures: Your physician has requested that you have a lexiscan myoview. For further information please visit [email or web address]. Please follow instruction sheet, as given.  Follow-Up:  To be determined after testing  Thank you for choosing [facility]!!   Trinidad Curet, RN [phone]  Any Other Special Instructions Will Be Listed Below (If Applicable).  Regadenoson injection What is this medicine? REGADENOSON is used to test the heart for coronary artery disease. It is used in patients who can not exercise for their stress test. This medicine may be used for other purposes; ask your health care provider or pharmacist if you have questions. COMMON BRAND NAME(S): Lexiscan What should I tell my health care provider before I take this medicine? They need to know if you have any of these conditions: -heart problems -lung or breathing disease, like asthma or COPD -an unusual or allergic reaction to regadenoson, other medicines, foods, dyes, or preservatives -pregnant or trying to get pregnant -breast-feeding How should I use this medicine? This medicine is for injection into a vein. It is given by a health care professional in a hospital or clinic setting. Talk to your pediatrician regarding the use of this medicine in children. Special care may be needed. Overdosage: If you think you have taken too much of this medicine contact a poison control center or emergency room at once. NOTE: This medicine is only for you. Do not share this medicine with others. What if I miss a dose? This does not apply. What may interact with this  medicine? -caffeine -dipyridamole -guarana -theophylline This list may not describe all possible interactions. Give your health care provider a list of all the medicines, herbs, non-prescription drugs, or dietary supplements you use. Also tell them if you smoke, drink alcohol, or use illegal drugs. Some items may interact with your medicine. What should I watch for while using this medicine? Your condition will be monitored carefully while you are receiving this medicine. Do not take medicines, foods, or drinks with caffeine (like coffee, tea, or colas) for at least 12 hours before your test. If you do not know if something contains caffeine, ask your health care professional. What side effects may I notice from receiving this medicine? Side effects that you should report to your doctor or health care professional as soon as possible: -allergic reactions like skin rash, itching or hives, swelling of the face, lips, or tongue -breathing problems -chest pain, tightness or palpitations -severe headache Side effects that usually do not require medical attention (report to your doctor or health care professional if they continue or are bothersome): -flushing -headache -irritation or pain at site where injected -nausea, vomiting This list may not describe all possible side effects. Call your doctor for medical advice about side effects. You may report side effects to FDA at 1-800-FDA-1088. Where should I keep my medicine? This drug is given in a hospital or clinic and will not be stored at home. NOTE: This sheet is a summary. It may not cover all possible information. If you have questions about this medicine, talk to your doctor, pharmacist, or health care provider.  2018 Elsevier/Gold Standard (2008-05-16 15:08:13)

## 2017-04-09 ENCOUNTER — Encounter (HOSPITAL_COMMUNITY): Payer: Medicare Other

## 2017-04-09 ENCOUNTER — Encounter (HOSPITAL_COMMUNITY): Payer: Self-pay

## 2017-04-09 ENCOUNTER — Telehealth: Payer: Self-pay | Admitting: Cardiology

## 2017-04-09 ENCOUNTER — Ambulatory Visit (HOSPITAL_COMMUNITY): Payer: Medicare Other | Attending: Cardiovascular Disease

## 2017-04-09 DIAGNOSIS — I2510 Atherosclerotic heart disease of native coronary artery without angina pectoris: Secondary | ICD-10-CM | POA: Insufficient documentation

## 2017-04-09 DIAGNOSIS — R079 Chest pain, unspecified: Secondary | ICD-10-CM | POA: Diagnosis not present

## 2017-04-09 IMAGING — NM NM MISC PROCEDURE
9 series · 54 of 54 positions shown · non-contrast
Comparison: none

[Series 1: wbr_s-proj_st stress_(id)_sa · 6.5mm · 6.51mm/px · 6 of 512 frames shown (1 of 2)]
[frame 43/512]
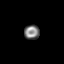
[frame 128/512]
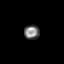
[frame 214/512]
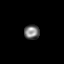
[frame 299/512]
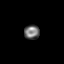
[frame 384/512]
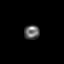
[frame 470/512]
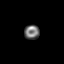

[Series 1: wbr_s-proj_st stress_(id)_sa · 6.5mm · 6.51mm/px · 6 of 64 frames shown (2 of 2)]
[frame 6/64]
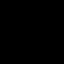
[frame 16/64]
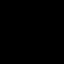
[frame 27/64]
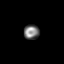
[frame 38/64]
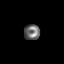
[frame 48/64]
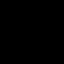
[frame 59/64]
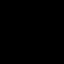

[Series 1: stress · 6.51mm/px · 6 of 64 frames shown (1 of 2)]
[frame 6/64]
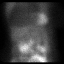
[frame 16/64]
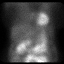
[frame 27/64]
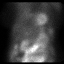
[frame 38/64]
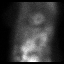
[frame 48/64]
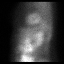
[frame 59/64]
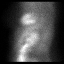

[Series 1: wbr_r-proj_st rest_(id)_sa · 6.5mm · 6.51mm/px · 6 of 64 frames shown]
[frame 6/64]
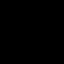
[frame 16/64]
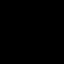
[frame 27/64]
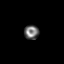
[frame 38/64]
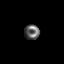
[frame 48/64]
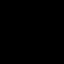
[frame 59/64]
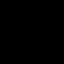

[Series 1: wbr_s-proj_st stress · 6.51mm/px · 6 of 64 frames shown (1 of 2)]
[frame 6/64]
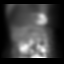
[frame 16/64]
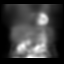
[frame 27/64]
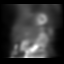
[frame 38/64]
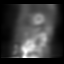
[frame 48/64]
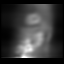
[frame 59/64]
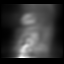

[Series 1: wbr_s-proj_st stress · 6.51mm/px · 6 of 512 frames shown (2 of 2)]
[frame 43/512]
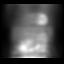
[frame 128/512]
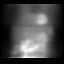
[frame 214/512]
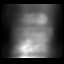
[frame 299/512]
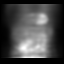
[frame 384/512]
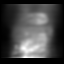
[frame 470/512]
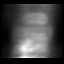

[Series 1: stress · 6.51mm/px · 6 of 386 frames shown (2 of 2)]
[frame 33/386  full-range]
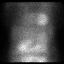
[frame 97/386  full-range]
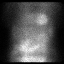
[frame 161/386  full-range]
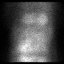
[frame 226/386  full-range]
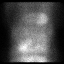
[frame 290/386  full-range]
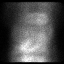
[frame 354/386  full-range]
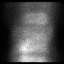

[Series 2: wbr_r-proj_st rest · 6.51mm/px · 6 of 64 frames shown]
[frame 6/64]
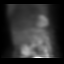
[frame 16/64]
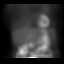
[frame 27/64]
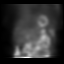
[frame 38/64]
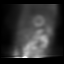
[frame 48/64]
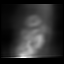
[frame 59/64]
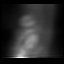

[Series 2: rest · 6.51mm/px · 6 of 64 frames shown]
[frame 6/64]
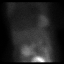
[frame 16/64]
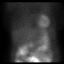
[frame 27/64]
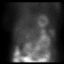
[frame 38/64]
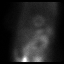
[frame 48/64]
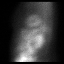
[frame 59/64]
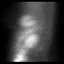

[54 of 54 positions shown; findings below may reference images not displayed]

Canned report from images found in remote index.

Refer to host system for actual result text.

## 2017-04-09 MED ORDER — TECHNETIUM TC 99M TETROFOSMIN IV KIT
31.7000 | PACK | Freq: Once | INTRAVENOUS | Status: AC | PRN
  Administered 2017-04-09: 31.7 via INTRAVENOUS
  Filled 2017-04-09: qty 32

## 2017-04-09 NOTE — Telephone Encounter (Signed)
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   Pt took her  metoprolol (LOPRESSOR) 50 MG tablet Take 50 mg by mouth 2 (two) times daily.   And wanted to know if it would effect her myocardial test, put pt through to [place]

## 2017-04-09 NOTE — Telephone Encounter (Signed)
Was calling patient back to inform her she didn't need to return my call since she already spoke with nuc med. Pt thanks me and states she will see [place] at 12:30.

## 2017-04-09 NOTE — Telephone Encounter (Signed)
Patient scheduled for Myoview today in an hour. Left message informing patient to call back or we will see her in office today to answer question/s.  Update: spoke with [facility] to update them and they have spoke with patient and her questions/concerns answered.

## 2017-04-10 ENCOUNTER — Ambulatory Visit (HOSPITAL_COMMUNITY): Payer: Medicare Other | Attending: Cardiology

## 2017-04-10 LAB — MYOCARDIAL PERFUSION IMAGING
CHL CUP NUCLEAR SDS: 6
CHL CUP NUCLEAR SRS: 8
Exercise duration (min): 5 min
LHR: 0.26
LVDIAVOL: 73 mL (ref 46–106)
LVSYSVOL: 34 mL
NUC STRESS TID: 0.64
Peak HR: 136 {beats}/min
Percent HR: 88 %
Rest HR: 90 {beats}/min
SSS: 14

## 2017-04-10 MED ORDER — TECHNETIUM TC 99M TETROFOSMIN IV KIT
30.5000 | PACK | Freq: Once | INTRAVENOUS | Status: AC | PRN
  Administered 2017-04-10: 30.5 via INTRAVENOUS
  Filled 2017-04-10: qty 31

## 2017-04-15 ENCOUNTER — Telehealth: Payer: Self-pay | Admitting: Cardiology

## 2017-04-15 NOTE — Telephone Encounter (Signed)
New message     Patient is returning Judeen Hammans call about a discussion they had last week for stress test

## 2017-04-15 NOTE — Telephone Encounter (Signed)
Spoke with pt not sure if needed any results appears Judeen Hammans had given myoview results.Pt did not have further questions Will call back if has cardiac issues in future ..Adonis Housekeeper

## 2017-04-16 ENCOUNTER — Telehealth: Payer: Self-pay | Admitting: Cardiology

## 2017-04-16 NOTE — Telephone Encounter (Signed)
Returned pts call and discussed her stress test results. See result note. 

## 2017-04-16 NOTE — Telephone Encounter (Signed)
Follow Up:    Pt returning your call from this morning,concerning her test results.

## 2017-04-16 NOTE — Telephone Encounter (Signed)
-----   Message from Will Meredith Leeds, MD sent at 04/11/2017  8:05 AM EDT ----- Low risk myoview

## 2017-04-17 ENCOUNTER — Telehealth: Payer: Self-pay | Admitting: Cardiology

## 2017-04-17 NOTE — Telephone Encounter (Signed)
Mrs. Kristin Gilmore is returning your call. Thanks

## 2017-04-17 NOTE — Telephone Encounter (Signed)
Advised patient she does not need to be taking ASA, per Dr. Curt Bears.  No indication. Patient verbalized understanding and agreeable to plan.

## 2017-04-24 ENCOUNTER — Ambulatory Visit (INDEPENDENT_AMBULATORY_CARE_PROVIDER_SITE_OTHER): Payer: Medicare Other | Admitting: Adult Health

## 2017-04-24 ENCOUNTER — Encounter: Payer: Self-pay | Admitting: Adult Health

## 2017-04-24 ENCOUNTER — Telehealth: Payer: Self-pay | Admitting: Adult Health

## 2017-04-24 VITALS — BP 153/89 | HR 77 | Wt 206.0 lb

## 2017-04-24 DIAGNOSIS — R251 Tremor, unspecified: Secondary | ICD-10-CM | POA: Diagnosis not present

## 2017-04-24 MED ORDER — LORAZEPAM 0.5 MG PO TABS: 0.2500 mg | ORAL_TABLET | Freq: Two times a day (BID) | ORAL | 0 refills | Status: DC | PRN

## 2017-04-24 NOTE — Patient Instructions (Signed)
Your Plan:  Consider Ativan for tremors If headaches return please let us know   Lorazepam tablets What is this medicine? LORAZEPAM (lor A ze pam) is a benzodiazepine. It is used to treat anxiety. This medicine may be used for other purposes; ask your health care provider or pharmacist if you have questions. COMMON BRAND NAME(S): Ativan What should I tell my health care provider before I take this medicine? They need to know if you have any of these conditions: -glaucoma -history of drug or alcohol abuse problem -kidney disease -liver disease -lung or breathing disease, like asthma -mental illness -myasthenia gravis -Parkinson's disease -suicidal thoughts, plans, or attempt; a previous suicide attempt by you or a family member -an unusual or allergic reaction to lorazepam, other medicines, foods, dyes, or preservatives -pregnant or trying to get pregnant -breast-feeding How should I use this medicine? Take this medicine by mouth with a glass of water. Follow the directions on the prescription label. Take your medicine at regular intervals. Do not take it more often than directed. Do not stop taking except on your doctor's advice. A special MedGuide will be given to you by the pharmacist with each prescription and refill. Be sure to read this information carefully each time. Talk to your pediatrician regarding the use of this medicine in children. While this drug may be used in children as young as 12 years for selected conditions, precautions do apply. Overdosage: If you think you have taken too much of this medicine contact a poison control center or emergency room at once. NOTE: This medicine is only for you. Do not share this medicine with others. What if I miss a dose? If you miss a dose, take it as soon as you can. If it is almost time for your next dose, take only that dose. Do not take double or extra doses. What may interact with this medicine? Do not take this medicine with  any of the following medications: -narcotic medicines for cough -sodium oxybate This medicine may also interact with the following medications: -alcohol -antihistamines for allergy, cough and cold -certain medicines for anxiety or sleep -certain medicines for depression, like amitriptyline, fluoxetine, sertraline -certain medicines for seizures like carbamazepine, phenobarbital, phenytoin, primidone -general anesthetics like lidocaine, pramoxine, tetracaine -MAOIs like Carbex, Eldepryl, Marplan, Nardil, and Parnate -medicines that relax muscles for surgery -narcotic medicines for pain -phenothiazines like chlorpromazine, mesoridazine, prochlorperazine, thioridazine This list may not describe all possible interactions. Give your health care provider a list of all the medicines, herbs, non-prescription drugs, or dietary supplements you use. Also tell them if you smoke, drink alcohol, or use illegal drugs. Some items may interact with your medicine. What should I watch for while using this medicine? Tell your doctor or health care professional if your symptoms do not start to get better or if they get worse. Do not stop taking except on your doctor's advice. You may develop a severe reaction. Your doctor will tell you how much medicine to take. You may get drowsy or dizzy. Do not drive, use machinery, or do anything that needs mental alertness until you know how this medicine affects you. To reduce the risk of dizzy and fainting spells, do not stand or sit up quickly, especially if you are an older patient. Alcohol may increase dizziness and drowsiness. Avoid alcoholic drinks. If you are taking another medicine that also causes drowsiness, you may have more side effects. Give your health care provider a list of all medicines you use. Your doctor  will tell you how much medicine to take. Do not take more medicine than directed. Call emergency for help if you have problems breathing or unusual  sleepiness. What side effects may I notice from receiving this medicine? Side effects that you should report to your doctor or health care professional as soon as possible: -allergic reactions like skin rash, itching or hives, swelling of the face, lips, or tongue -breathing problems -confusion -loss of balance or coordination -signs and symptoms of low blood pressure like dizziness; feeling faint or lightheaded, falls; unusually weak or tired -suicidal thoughts or other mood changes Side effects that usually do not require medical attention (report to your doctor or health care professional if they continue or are bothersome): -dizziness -headache -nausea, vomiting -tiredness This list may not describe all possible side effects. Call your doctor for medical advice about side effects. You may report side effects to FDA at 1-800-FDA-1088. Where should I keep my medicine? Keep out of the reach of children. This medicine can be abused. Keep your medicine in a safe place to protect it from theft. Do not share this medicine with anyone. Selling or giving away this medicine is dangerous and against the law. This medicine may cause accidental overdose and death if taken by other adults, children, or pets. Mix any unused medicine with a substance like cat litter or coffee grounds. Then throw the medicine away in a sealed container like a sealed bag or a coffee can with a lid. Do not use the medicine after the expiration date. Store at room temperature between 20 and 25 degrees C (68 and 77 degrees F). Protect from light. Keep container tightly closed. NOTE: This sheet is a summary. It may not cover all possible information. If you have questions about this medicine, talk to your doctor, pharmacist, or health care provider.  2018 Elsevier/Gold Standard (2015-06-15 15:54:27)     Thank you for coming to see [place] at [facility]. I hope we have been able to provide you high quality care  today.  You may receive a patient satisfaction survey over the next few weeks. We would appreciate your feedback and comments so that we may continue to improve ourselves and the health of our patients.

## 2017-04-24 NOTE — Addendum Note (Signed)
Addended by: Trudie Buckler on: 04/24/2017 04:14 PM   Modules accepted: Orders

## 2017-04-24 NOTE — Telephone Encounter (Signed)
Fax confirmation received for [facility] [phone].

## 2017-04-24 NOTE — Progress Notes (Signed)
PATIENT: Kristin Gilmore DOB: [birth year]  REASON FOR VISIT: follow up- tremor, migraine headaches HISTORY FROM: patient  HISTORY OF PRESENT ILLNESS: 04/24/17  Kristin Gilmore is a 66 year old female with a history of migraine headaches and tremors. She returns today for follow-up. Patient states that she is no longer having any headaches. She states that she has noticed that her tremor has gotten worse over time. She states that it is most bothersome when she is out with her friends or in public. She states that if she is anxious or stressed it tends to be worse. The tremor primarily occurs in the left hand and leg. She notices that it occurs more with rest. She has no difficulty feeding herself. No difficulty writing. No change in her gait or balance. Denies any episodes of choking or difficulty swallowing. She is currently not on any medication for the tremor nor the headaches. She returns today for an evaluation.  HISTORY [name] is a 66 year old female with a history of migraine headaches. She returns today for follow-up. She states in the last 2 months her headaches have returned. She has essentially been headache free since November 2016. She states that these headaches are not her typical migraines. She describes it as a dull headache but usually starts when she wakes up in the morning and gets worse as the day goes on. The headache is located across the forehead. At times she feels that she has dark circles around her eyes. She states that loud noises and bright lights do bother her. In the past she has tried and failed several medications for migraine prevention. She denies any numbness or tingling in the upper or lower extremities. The patient states that she does snore occasionally at night. She denies any excessive daytime sleepiness. In the past she has tried a prednisone Dosepak with good benefit. She is also been on Botox for migraine prevention. She is also tried Chemical engineer.  She denies any new neurological symptoms. She returns today for an evaluation.   REVIEW OF SYSTEMS: Out of a complete 14 system review of symptoms, the patient complains only of the following symptoms, and all other reviewed systems are negative.  See history of present illness  ALLERGIES: Allergies  Allergen Reactions  . Biaxin [Clarithromycin] Hives  . Amoxicillin     HOME MEDICATIONS: Outpatient Medications Prior to Visit  Medication Sig Dispense Refill  . acetaminophen (TYLENOL) 500 MG tablet Take 500 mg by mouth every 6 (six) hours as needed.    . calcium-vitamin D (OSCAL WITH D) 500-200 MG-UNIT per tablet Take 1 tablet by mouth daily.    . Cholecalciferol (VITAMIN D3) 2000 UNITS TABS Take 1 tablet by mouth daily.    Marland Kitchen co-enzyme Q-10 50 MG capsule Take 50 mg by mouth daily.    Marland Kitchen glucosamine-chondroitin 500-400 MG tablet Take 1 tablet by mouth 3 (three) times daily.    . Magnesium 250 MG TABS Take 1.5 tablets by mouth daily.    . metoprolol (LOPRESSOR) 50 MG tablet Take 50 mg by mouth 2 (two) times daily.    . mirabegron ER (MYRBETRIQ) 25 MG TB24 tablet Take 25 mg by mouth daily.    . Omega 3 1000 MG CAPS Take 1 capsule by mouth daily.    Marland Kitchen omeprazole (PRILOSEC) 40 MG capsule Take 40 mg by mouth daily.    . QUEtiapine (SEROQUEL) 300 MG tablet Take 150 mg by mouth at bedtime.     . Riboflavin (VITAMIN B-2) 25  MG TABS Take 1 capsule by mouth daily.    . Turmeric 500 MG CAPS Take 2 capsules by mouth daily.    Marland Kitchen UNABLE TO FIND Med Name: Suprema Dophilus    . vitamin E 400 UNIT capsule Take 400 Units by mouth daily.     No facility-administered medications prior to visit.     PAST MEDICAL HISTORY: Past Medical History:  Diagnosis Date  . Arthritis   . Depression   . Dyslipidemia   . Hypertension   . Migraine   . Migraine without aura, without mention of intractable migraine without mention of status migrainosus 04/13/2014  . Pituitary microadenoma ([place]) 04/13/2014  .  Tremor 04/13/2014    PAST SURGICAL HISTORY: Past Surgical History:  Procedure Laterality Date  . CHOLECYSTECTOMY    . NASAL SINUS SURGERY      FAMILY HISTORY: Family History  Problem Relation Age of Onset  . Cancer Father        stomach cancer  . Migraines Mother     SOCIAL HISTORY: Social History   Social History  . Marital status: Married    Spouse name: N/A  . Number of children: 1  . Years of education: college   Occupational History  . Retired    Social History Main Topics  . Smoking status: Never Smoker  . Smokeless tobacco: Never Used  . Alcohol use 0.0 oz/week     Comment: occas.  . Drug use: No  . Sexual activity: Not on file   Other Topics Concern  . Not on file   Social History Narrative   Lives at home, married   Patient does not drink caffeine.   Patient is right handed.      PHYSICAL EXAM  Vitals:   04/24/17 1058  BP: (!) 153/89  Pulse: 77  Weight: 206 lb (93.4 kg)   Body mass index is 32.26 kg/m.  Generalized: Well developed, in no acute distress   Neurological examination  Mentation: Alert oriented to time, place, history taking. Follows all commands speech and language fluent Cranial nerve II-XII: Pupils were equal round reactive to light. Extraocular movements were full, visual field were full on confrontational test. Facial sensation and strength were normal. Uvula tongue midline. Head turning and shoulder shrug  were normal and symmetric. Motor: The motor testing reveals 5 over 5 strength of all 4 extremities. Good symmetric motor tone is noted throughout. Resting tremor noted in the left hand and left leg. Sensory: Sensory testing is intact to soft touch on all 4 extremities. No evidence of extinction is noted.  Coordination: Cerebellar testing reveals good finger-nose-finger and heel-to-shin bilaterally.  Gait and station: Gait is normal. Good arm swing. Mild tremor noted in the left hand when ambulating. Tandem gait is normal.  Romberg is negative. No drift is seen.  Reflexes: Deep tendon reflexes are symmetric and normal bilaterally.   DIAGNOSTIC DATA (LABS, IMAGING, TESTING) - I reviewed patient records, labs, notes, testing and imaging myself where available.  Lab Results  Component Value Date   WBC 7.9 03/27/2016   HGB 13.4 03/27/2016   HCT 39.5 03/27/2016   MCV 91.2 03/27/2016   PLT 242 03/27/2016      Component Value Date/Time   NA 135 03/27/2016 1445   NA 142 11/01/2015 1232   K 4.1 03/27/2016 1445   CL 98 (L) 03/27/2016 1445   CO2 27 03/27/2016 1445   GLUCOSE 135 (H) 03/27/2016 1445   BUN 19 03/27/2016 1445   BUN  29 (H) 11/01/2015 1232   CREATININE 1.04 (H) 03/27/2016 1445   CALCIUM 9.4 03/27/2016 1445   PROT 6.5 11/01/2015 1232   ALBUMIN 4.5 11/01/2015 1232   AST 28 11/01/2015 1232   ALT 37 (H) 11/01/2015 1232   ALKPHOS 91 11/01/2015 1232   BILITOT 0.3 11/01/2015 1232   GFRNONAA 55 (L) 03/27/2016 1445   GFRAA >60 03/27/2016 1445    Lab Results  Component Value Date   VITAMINB12 338 11/01/2015   Lab Results  Component Value Date   TSH 0.969 11/01/2015      ASSESSMENT AND PLAN 66 y.o. year old female  has a past medical history of Arthritis; Depression; Dyslipidemia; Hypertension; Migraine; Migraine without aura, without mention of intractable migraine without mention of status migrainosus (04/13/2014); Pituitary microadenoma ([place]) (04/13/2014); and Tremor (04/13/2014). here with:  1. Tremors 2. Headaches  The patient's headaches have resolved. She does feel the tremor has slowly gotten worse. She finds it most bothersome when she is in public. We discussed starting low-dose Ativan to take only when needed before going out in public. She states that she would like to discuss this with her husband and will call us back to let us know if she would like to start it. She returns today for an evaluation.  Ward Givens, MSN, NP-C 04/24/2017, 10:51 AM [facility] [address] [place], [place] [address] [phone]

## 2017-04-24 NOTE — Telephone Encounter (Signed)
Order placed

## 2017-04-24 NOTE — Telephone Encounter (Signed)
Pt calling to inform that she would like to go on the suggested medication Jinny Blossom spoke to her about earlier today. Please [facility] [phone], please call pt when processed

## 2017-04-24 NOTE — Progress Notes (Signed)
I have read the note, and I agree with the clinical assessment and plan.  Magda Muise KEITH   

## 2017-04-25 NOTE — Telephone Encounter (Signed)
Pt called said she has checked [facility] yesterday and again this morning and the RX has not been rec'd. I confirmed the phone # [phone] I told her the RX was sent yesterday and a confirmation was rec'd, she said they don not have it. I advised her I would send the msg to RN to check on it. The patient was agitated and hung up.

## 2017-04-25 NOTE — Telephone Encounter (Signed)
LMOM that I have spoken with pharmacist at [facility] [address]. and given verbal rx. for the Lorazepam rx. (0.5mg  po bid prn tremor #15 with 0 refills) that was sent in yesterday.  She does not need to return this call unless she has questions/fim

## 2017-04-30 ENCOUNTER — Ambulatory Visit (INDEPENDENT_AMBULATORY_CARE_PROVIDER_SITE_OTHER): Payer: Medicare Other | Admitting: Podiatry

## 2017-04-30 ENCOUNTER — Encounter: Payer: Self-pay | Admitting: Podiatry

## 2017-04-30 DIAGNOSIS — L600 Ingrowing nail: Secondary | ICD-10-CM

## 2017-04-30 DIAGNOSIS — L603 Nail dystrophy: Secondary | ICD-10-CM | POA: Diagnosis not present

## 2017-04-30 MED ORDER — GABAPENTIN 100 MG PO CAPS: 100.0000 mg | ORAL_CAPSULE | Freq: Three times a day (TID) | ORAL | 1 refills | Status: DC | PRN

## 2017-04-30 NOTE — Telephone Encounter (Signed)
Received the fax and was different provider. (reason for not getting faxes).  PA initiated with covermy meds. [name] for pt that could take up to 72hrs to get result of PA.  She is to call back if questions.

## 2017-04-30 NOTE — Telephone Encounter (Signed)
Pt has called stating that [facility] needs to contact [employer] re: the medication (Lorazepam).  In speaking with pt about what needed to be done pt stated she could not stay on call all day.  Pt then stated just fix it and ended the call.  As a result of pt ending the call I was unable to obtain a direct telephone # to who needed to be contacted at [facility] re: [facility]. Pt did confirm her telephone# as to where she could be reached before ending the call.

## 2017-04-30 NOTE — Progress Notes (Signed)
This patient presents the office to discuss the big toe on her left foot.  She is certain she has developed a fungal infection and has tried Vicks VapoRub for months with minimal relief.  She says her toe continues to be fungus.  She says her husband has extremely thick, yellow and deformed toenails which are fungus and she does not wish to develop the same fungal infection.  She presents the office that the tips of her left big toe outside border. I yellowed, but not thickened or disfigured.  She does state that she has pain along the outside border of the big toe of the left big toenail.  She presents the office today for an evaluation and treatment. She previously was seen in this office and the removal of her fifth toenail left foot was performed years ago.     GENERAL APPEARANCE: Alert, conversant. Appropriately groomed. No acute distress.  VASCULAR: Pedal pulses are  palpable at  [facility] and PT bilateral.  Capillary refill time is immediate to all digits,  Normal temperature gradient.   NEUROLOGIC: sensation is normal to 5.07 monofilament at 5/5 sites bilateral.  Light touch is intact bilateral, Muscle strength normal.  MUSCULOSKELETAL: acceptable muscle strength, tone and stability bilateral.  Intrinsic muscluature intact bilateral.  Rectus appearance of foot and digits noted bilateral.  NAILS  Marked incurvation lateral border left great toenail.  No evidence of infection or drainage.   DERMATOLOGIC: skin color, texture, and turgor are within normal limits.  No preulcerative lesions or ulcers  are seen, no interdigital maceration noted.  No open lesions present.   No drainage noted.  Ingrown toenail lateral border left great toe.  ROV  discussed her toenail and told her that it does not have signs of a fungal infection.  She does have marked incurvation noted along the lateral border of the left great toe in the absence of any granulation tissue or pus.  Told the patient that she is a candidate for  permanent nail surgery for the removal of the ingrown toenail lateral border left great toe in the future.   Gardiner Barefoot DPM

## 2017-04-30 NOTE — Telephone Encounter (Signed)
I called patient. The Ativan was denied through insurance. I will try low-dose of gabapentin to take if needed for the tremor.  The patient likely has been on this medication before for treatment of her headache.

## 2017-04-30 NOTE — Telephone Encounter (Signed)
Received denial for ativan on covermy meds.   Did you want to try something else?  MM/NP saw pt, she is out of office until next week.

## 2017-04-30 NOTE — Telephone Encounter (Signed)
Spoke to [place] at [facility], about pt prescription of ativan.   [employer] requires PA.  She will fax to me at [phone].

## 2017-04-30 NOTE — Addendum Note (Signed)
Addended by: Kathrynn Ducking on: 04/30/2017 05:25 PM   Modules accepted: Orders

## 2017-05-01 NOTE — Telephone Encounter (Signed)
Per phone staff, Elta Guadeloupe with [employer] called advising Lorazepam has been approved thru insurance for 04/30/17-04/30/18. Letter will be sent, and patient was notified by [facility].

## 2017-05-01 NOTE — Telephone Encounter (Signed)
Mark with [employer] called advising Lorazepam has been approved thru insurance for 04/30/17-04/30/18.  No reference # to give, but will be sending letting and Kristin Gilmore is contacting patient now.  FYI

## 2017-05-06 NOTE — Telephone Encounter (Signed)
Received notice of approval via mail for rx lorazepam 0.5mg  tablets from [facility].

## 2017-06-17 ENCOUNTER — Encounter: Payer: Self-pay | Admitting: Podiatry

## 2017-06-17 ENCOUNTER — Ambulatory Visit (INDEPENDENT_AMBULATORY_CARE_PROVIDER_SITE_OTHER): Payer: Medicare Other | Admitting: Podiatry

## 2017-06-17 DIAGNOSIS — L03032 Cellulitis of left toe: Secondary | ICD-10-CM

## 2017-06-17 DIAGNOSIS — L600 Ingrowing nail: Secondary | ICD-10-CM

## 2017-06-17 NOTE — Progress Notes (Signed)
This patient returns to the office saying that she has extreme pain and discomfort along the outside border the big toe of the left big toe.  She was seen previously and surgery was discussed for the permanent correction of this lateral border left great toe.  She now presents the office stating that she gets pain and swollen and is even bleeding.  She has pain even when the covers touching.  She says she is unable to treat herself.  She does say that she was seen by dermatologist who prescribed a nail lacquer for her nail, but told her to present to the office today for definitive treatment of this ingrowing toenail.     GENERAL APPEARANCE: Alert, conversant. Appropriately groomed. No acute distress.  VASCULAR: Pedal pulses are  palpable at  [facility] and PT bilateral.  Capillary refill time is immediate to all digits,  Normal temperature gradient.  Digital hair growth is present bilateral  NEUROLOGIC: sensation is normal to 5.07 monofilament at 5/5 sites bilateral.  Light touch is intact bilateral, Muscle strength normal.  MUSCULOSKELETAL: acceptable muscle strength, tone and stability bilateral.  Intrinsic muscluature intact bilateral.  Rectus appearance of foot and digits noted bilateral.  NAIL  marked incurvation noted along the lateral border of the left great toenail.  Patient has redness and swelling especially at the distal aspect of the nail groove. DERMATOLOGIC: skin color, texture, and turgor are within normal limits.  No preulcerative lesions or ulcers  are seen, no interdigital maceration noted.  No open lesions present.   No drainage noted.  Paronychia left great toe  Ingrown toenail left hallux.  ROV  Nail surgery.  Treatment options and alternatives discussed.  Recommended permanent phenol matrixectomy and patient agreed.  Left hallux  was prepped with alcohol and a toe block of 3cc of 2% lidocaine plain was administered in a digital toe block. .  The toe was then prepped with betadine solution  .  The offending nail border was then excised and matrix tissue exposed.  Phenol was then applied to the matrix tissue followed by an alcohol wash.  Antibiotic ointment and a dry sterile dressing was applied.  The patient was dispensed instructions for aftercare. RTC 1 week.     Gardiner Barefoot DPM

## 2017-06-24 ENCOUNTER — Ambulatory Visit (INDEPENDENT_AMBULATORY_CARE_PROVIDER_SITE_OTHER): Payer: Self-pay | Admitting: Podiatry

## 2017-06-24 ENCOUNTER — Encounter: Payer: Self-pay | Admitting: Podiatry

## 2017-06-24 DIAGNOSIS — Z09 Encounter for follow-up examination after completed treatment for conditions other than malignant neoplasm: Secondary | ICD-10-CM

## 2017-06-24 NOTE — Progress Notes (Signed)
This patient returns to the office following nail surgery one week ago.  The patient says toe has been soaked and bandaged as directed.  There has been improvement of the toe since the surgery has been performed. The patient presents for continued evaluation and treatment.  GENERAL APPEARANCE: Alert, conversant. Appropriately groomed. No acute distress.  VASCULAR: Pedal pulses palpable at  [facility] and PT bilateral.  Capillary refill time is immediate to all digits,  Normal temperature gradient.    NEUROLOGIC: sensation is normal to 5.07 monofilament at 5/5 sites bilateral.  Light touch is intact bilateral, Muscle strength normal.  MUSCULOSKELETAL: acceptable muscle strength, tone and stability bilateral.  Intrinsic muscluature intact bilateral.  Rectus appearance of foot and digits noted bilateral.   DERMATOLOGIC: skin color, texture, and turgor are within normal limits.  No preulcerative lesions or ulcers  are seen, no interdigital maceration noted.   NAILS  There is necrotic tissue along the nail groove  In the absence of redness swelling and pain.  DX  S/p nail surgery  ROV  Home instructions were discussed.  Patient to call the office if there are any questions or concerns.   Sherrill Mckamie DPM   

## 2017-10-07 ENCOUNTER — Ambulatory Visit: Payer: Medicare Other | Admitting: Neurology

## 2017-10-17 ENCOUNTER — Telehealth: Payer: Self-pay

## 2017-10-17 NOTE — Telephone Encounter (Signed)
   [facility] Pre-operative Risk Assessment    Request for surgical clearance:  1. What type of surgery is being performed? Lumbar decompression   2. When is this surgery scheduled? TBD   3. Are there any medications that need to be held prior to surgery and how long?not stated   4. Practice name and name of physician performing surgery? [facility] Dr. Susa Day  5. What is your office phone and fax number? FAX [phone]   6. Anesthesia type (None, local, MAC, general) ? Not stated   Tod Persia 10/17/2017, 2:11 PM  _________________________________________________________________   (provider comments below)

## 2017-10-20 NOTE — Telephone Encounter (Addendum)
   Primary Cardiologist: Will Meredith Leeds, MD  Chart reviewed as part of pre-operative protocol coverage.   She was seen approximately 6 months ago, evaluated for chest pain and had a low-risk Myoview.  Was able to reach her by phone.  She has not had any additional episodes of chest pain.  She is able to climb stairs albeit slowly because of her back problems.  She denies any shortness of breath with exertion although her exertion is limited by her back issues.  Given past medical history and time since last visit, based on ACC/AHA guidelines, Remedy Corporan Trudel would be at acceptable risk for the planned procedure without further cardiovascular testing.   I will route this recommendation to the requesting party via Epic fax function and remove from pre-op pool.  Please call with questions.  Rosaria Ferries, PA-C 10/21/2017, 3:21 PM

## 2017-11-04 ENCOUNTER — Ambulatory Visit: Payer: Medicare Other | Admitting: Neurology

## 2017-11-07 ENCOUNTER — Other Ambulatory Visit: Payer: Self-pay | Admitting: Obstetrics and Gynecology

## 2017-11-07 DIAGNOSIS — E2839 Other primary ovarian failure: Secondary | ICD-10-CM

## 2017-11-13 ENCOUNTER — Other Ambulatory Visit: Payer: Medicare Other

## 2017-11-20 ENCOUNTER — Inpatient Hospital Stay
Admission: RE | Admit: 2017-11-20 | Discharge: 2017-11-20 | Disposition: A | Discharge status: Home / Self Care | Payer: Medicare Other | Source: Ambulatory Visit | Attending: Obstetrics and Gynecology | Admitting: Obstetrics and Gynecology

## 2017-12-05 ENCOUNTER — Other Ambulatory Visit: Payer: Self-pay | Admitting: Neurosurgery

## 2017-12-10 NOTE — Pre-Procedure Instructions (Signed)
Kristin Gilmore  12/10/2017      [id number] - [place], [place] [address] [place] [address] Phone: [phone] Fax: [phone]  [facility] [phone] - [place], [place] - [address] [place] [address] Phone: [phone] Fax: [phone]  [id number] [place], [place] - [address] [place] [address] Phone: [phone] Fax: [phone]    Your procedure is scheduled on 12/19/2017.  Report to [facility] Admitting at 1000 A.M.  Call this number if you have problems the morning of surgery:  [phone]   Remember:  Do not eat food or drink liquids after midnight.   Continue all medications as directed by your physician except follow these medication instructions before surgery below   Take these medicines the morning of surgery with A SIP OF WATER: Acetaminophen (Tylenol) - if needed Fluticasone (Flonase) - if needed Gabapentin (Neurontin) Metoprolol (Lopressor) Propanolol (Inderal) - if needed  7 days prior to surgery STOP taking any Aspirin (unless otherwise instructed by your surgeon), Aleve, Naproxen, Ibuprofen, Motrin, Advil, Goody's, BC's, all herbal medications, fish oil, and all vitamins    Do not wear jewelry, make-up or nail polish.  Do not wear lotions, powders, or perfumes, or deodorant.  Do not shave 48 hours prior to surgery.  Do not bring valuables to the hospital.  [facility] is not responsible for any belongings or valuables.  Hearing aids, eyeglasses, contacts, dentures or bridgework may not be worn into surgery.  Leave your suitcase in the car.  After surgery it may be brought to your room.  For patients admitted to the hospital, discharge time will be determined by your treatment team.  Patients discharged the day of surgery will not be allowed to  drive home.   Name and phone number of your driver:    Special instructions:   [facility]- Preparing For Surgery  Before surgery, you can play an important role. Because skin is not sterile, your skin needs to be as free of germs as possible. You can reduce the number of germs on your skin by washing with CHG (chlorahexidine gluconate) Soap before surgery.  CHG is an antiseptic cleaner which kills germs and bonds with the skin to continue killing germs even after washing.  Please do not use if you have an allergy to CHG or antibacterial soaps. If your skin becomes reddened/irritated stop using the CHG.  Do not shave (including legs and underarms) for at least 48 hours prior to first CHG shower. It is OK to shave your face.  Please follow these instructions carefully.   1. Shower the NIGHT BEFORE SURGERY and the MORNING OF SURGERY with CHG.   2. If you chose to wash your hair, wash your hair first as usual with your normal shampoo.  3. After you shampoo, rinse your hair and body thoroughly to remove the shampoo.  4. Use CHG as you would any other liquid soap. You can apply CHG directly to the skin and wash gently with a scrungie or a clean washcloth.   5. Apply the CHG Soap to your body ONLY FROM THE NECK DOWN.  Do not use on open wounds or open sores. Avoid contact with your eyes, ears, mouth and genitals (private parts). Wash Face and genitals (private parts)  with your normal soap.  6. Wash thoroughly, paying special attention to the area where your  surgery will be performed.  7. Thoroughly rinse your body with warm water from the neck down.  8. DO NOT shower/wash with your normal soap after using and rinsing off the CHG Soap.  9. Pat yourself dry with a CLEAN TOWEL.  10. Wear CLEAN PAJAMAS to bed the night before surgery, wear comfortable clothes the morning of surgery  11. Place CLEAN SHEETS on your bed the night of your first shower and DO NOT SLEEP WITH PETS.    Day of  Surgery: Shower as stated above. Do not apply any deodorants/lotions.  Please wear clean clothes to the hospital/surgery center.      Please read over the following fact sheets that you were given.

## 2017-12-11 ENCOUNTER — Encounter (HOSPITAL_COMMUNITY)
Admission: RE | Admit: 2017-12-11 | Discharge: 2017-12-11 | Disposition: A | Discharge status: Home / Self Care | Payer: Medicare Other | Source: Ambulatory Visit | Attending: Neurosurgery | Admitting: Neurosurgery

## 2017-12-11 ENCOUNTER — Encounter (HOSPITAL_COMMUNITY): Payer: Self-pay

## 2017-12-11 ENCOUNTER — Other Ambulatory Visit: Payer: Self-pay

## 2017-12-11 DIAGNOSIS — Z01818 Encounter for other preprocedural examination: Secondary | ICD-10-CM | POA: Insufficient documentation

## 2017-12-11 DIAGNOSIS — Z79899 Other long term (current) drug therapy: Secondary | ICD-10-CM | POA: Diagnosis not present

## 2017-12-11 DIAGNOSIS — R251 Tremor, unspecified: Secondary | ICD-10-CM | POA: Insufficient documentation

## 2017-12-11 DIAGNOSIS — E785 Hyperlipidemia, unspecified: Secondary | ICD-10-CM | POA: Insufficient documentation

## 2017-12-11 DIAGNOSIS — I10 Essential (primary) hypertension: Secondary | ICD-10-CM | POA: Diagnosis not present

## 2017-12-11 DIAGNOSIS — D352 Benign neoplasm of pituitary gland: Secondary | ICD-10-CM | POA: Diagnosis not present

## 2017-12-11 DIAGNOSIS — M48061 Spinal stenosis, lumbar region without neurogenic claudication: Secondary | ICD-10-CM | POA: Insufficient documentation

## 2017-12-11 LAB — CBC
HEMATOCRIT: 43.3 % (ref 36.0–46.0)
Hemoglobin: 14.4 g/dL (ref 12.0–15.0)
MCH: 31.2 pg (ref 26.0–34.0)
MCHC: 33.3 g/dL (ref 30.0–36.0)
MCV: 93.7 fL (ref 78.0–100.0)
PLATELETS: 247 10*3/uL (ref 150–400)
RBC: 4.62 MIL/uL (ref 3.87–5.11)
RDW: 13.2 % (ref 11.5–15.5)
WBC: 9.5 10*3/uL (ref 4.0–10.5)

## 2017-12-11 LAB — BASIC METABOLIC PANEL
Anion gap: 12 (ref 5–15)
BUN: 18 mg/dL (ref 6–20)
CO2: 26 mmol/L (ref 22–32)
Calcium: 9.5 mg/dL (ref 8.9–10.3)
Chloride: 103 mmol/L (ref 101–111)
Creatinine, Ser: 0.95 mg/dL (ref 0.44–1.00)
GLUCOSE: 128 mg/dL — AB (ref 65–99)
POTASSIUM: 4.2 mmol/L (ref 3.5–5.1)
Sodium: 141 mmol/L (ref 135–145)

## 2017-12-11 LAB — SURGICAL PCR SCREEN
MRSA, PCR: NEGATIVE
STAPHYLOCOCCUS AUREUS: POSITIVE — AB

## 2017-12-11 MED ORDER — CHLORHEXIDINE GLUCONATE CLOTH 2 % EX PADS: 6.0000 | MEDICATED_PAD | Freq: Once | CUTANEOUS | Status: DC

## 2017-12-11 NOTE — Progress Notes (Addendum)
PCP: Bing Matter PA-C  Cardiologist: Allegra Lai, MD-just seen for cardiac clearance  EKG: 04/08/17 in EPIC  Stress test: 04/10/17 in EPIC  ECHO: 11/23/15 in EPIC  Cardiac Cath: pt denies  Chest x-ray: pt denies past year, no recent respiratory infections/complications

## 2017-12-11 NOTE — Progress Notes (Signed)
Surgical PCR  +staph aureus. [facility] [address] [place]. Patient notified.

## 2017-12-12 NOTE — Progress Notes (Signed)
Anesthesia Chart Review:  Pt is a 67 year old female scheduled for L2-3, L3-4, L4-5 laminectomy 12/19/2017 with Consuella Lose, MD  - PCP is Bing Matter, PA (notes in care everywhere)  - Cardiologist is Allegra Lai, MD who evaluated pt for atypical chest pain 04/08/17; stress test results below. Pt cleared for surgery by Rosaria Ferries, PA on 10/20/17  PMH includes:  HTN, dyslipidemia, pituitary microadenoma, tremor. Never smoker. BMI 32.5  Medications include: Metoprolol, propranolol (10mg  daily dose prn tremor), Seroquel  BP (!) 170/88 Comment: taken manaully/notified Dance movement psychotherapist  Pulse 74   Temp 36.7 C   Resp 20   Ht 5\' 7"  (1.702 m)   Wt 206 lb 12.8 oz (93.8 kg)   SpO2 97%   BMI 32.39 kg/m   Preoperative labs reviewed.    EKG 04/08/17: NSR.  T wave abnormality, consider lateral ischemia.  Nuclear stress test 04/09/17:   Nuclear stress EF: 53%.  Clinically and electrically negative for ischemia  Probable normal perfusion and soft tissue attenuation No ischemia or scar  This is a low risk study.  Echo 11/23/15 - Left ventricle: The cavity size was normal. Wall thickness was normal. Systolic function was normal. The estimated ejection fraction was in the range of 55% to 60%. Wall motion was normal; there were no regional wall motion abnormalities. Doppler parameters are consistent with both elevated ventricular end-diastolic filling pressure and elevated left atrial filling pressure. - Left atrium: The atrium was mildly dilated. - Atrial septum: No defect or patent foramen ovale was identified.  If no changes, I anticipate pt can proceed with surgery as scheduled.   Willeen Cass, FNP-BC [facility]/Anesthesiology Phone: [phone] 12/12/2017 12:29 PM

## 2017-12-18 MED ORDER — VANCOMYCIN HCL IN DEXTROSE 1-5 GM/200ML-% IV SOLN
1000.0000 mg | INTRAVENOUS | Status: AC
  Administered 2017-12-19: 1000 mg via INTRAVENOUS
  Filled 2017-12-18: qty 200

## 2017-12-19 ENCOUNTER — Inpatient Hospital Stay (HOSPITAL_COMMUNITY)
Admission: RE | Admit: 2017-12-19 | Discharge: 2017-12-22 | DRG: 519 | Disposition: A | Discharge status: Home Health | Payer: Medicare Other | Source: Ambulatory Visit | Attending: Neurosurgery | Admitting: Neurosurgery

## 2017-12-19 ENCOUNTER — Ambulatory Visit (HOSPITAL_COMMUNITY): Payer: Medicare Other | Admitting: Certified Registered Nurse Anesthetist

## 2017-12-19 ENCOUNTER — Ambulatory Visit (HOSPITAL_COMMUNITY): Payer: Medicare Other

## 2017-12-19 ENCOUNTER — Ambulatory Visit (HOSPITAL_COMMUNITY): Payer: Medicare Other | Admitting: Emergency Medicine

## 2017-12-19 ENCOUNTER — Encounter (HOSPITAL_COMMUNITY): Payer: Self-pay | Admitting: Urology

## 2017-12-19 ENCOUNTER — Inpatient Hospital Stay (HOSPITAL_COMMUNITY)
Admission: RE | Disposition: A | Discharge status: Home Health | Payer: Self-pay | Source: Ambulatory Visit | Attending: Neurosurgery

## 2017-12-19 DIAGNOSIS — G43009 Migraine without aura, not intractable, without status migrainosus: Secondary | ICD-10-CM | POA: Diagnosis present

## 2017-12-19 DIAGNOSIS — Z419 Encounter for procedure for purposes other than remedying health state, unspecified: Secondary | ICD-10-CM

## 2017-12-19 DIAGNOSIS — Z79899 Other long term (current) drug therapy: Secondary | ICD-10-CM | POA: Diagnosis not present

## 2017-12-19 DIAGNOSIS — M1990 Unspecified osteoarthritis, unspecified site: Secondary | ICD-10-CM | POA: Diagnosis present

## 2017-12-19 DIAGNOSIS — Z880 Allergy status to penicillin: Secondary | ICD-10-CM | POA: Diagnosis not present

## 2017-12-19 DIAGNOSIS — Z6833 Body mass index (BMI) 33.0-33.9, adult: Secondary | ICD-10-CM

## 2017-12-19 DIAGNOSIS — G9741 Accidental puncture or laceration of dura during a procedure: Secondary | ICD-10-CM | POA: Diagnosis not present

## 2017-12-19 DIAGNOSIS — M48061 Spinal stenosis, lumbar region without neurogenic claudication: Secondary | ICD-10-CM | POA: Diagnosis present

## 2017-12-19 DIAGNOSIS — Z881 Allergy status to other antibiotic agents status: Secondary | ICD-10-CM

## 2017-12-19 DIAGNOSIS — F329 Major depressive disorder, single episode, unspecified: Secondary | ICD-10-CM | POA: Diagnosis present

## 2017-12-19 DIAGNOSIS — Z7951 Long term (current) use of inhaled steroids: Secondary | ICD-10-CM

## 2017-12-19 DIAGNOSIS — I10 Essential (primary) hypertension: Secondary | ICD-10-CM | POA: Diagnosis present

## 2017-12-19 DIAGNOSIS — R251 Tremor, unspecified: Secondary | ICD-10-CM | POA: Diagnosis present

## 2017-12-19 DIAGNOSIS — D352 Benign neoplasm of pituitary gland: Secondary | ICD-10-CM | POA: Diagnosis present

## 2017-12-19 DIAGNOSIS — E669 Obesity, unspecified: Secondary | ICD-10-CM | POA: Diagnosis present

## 2017-12-19 HISTORY — DX: Prediabetes: R73.03

## 2017-12-19 HISTORY — PX: LUMBAR LAMINECTOMY/DECOMPRESSION MICRODISCECTOMY: SHX5026

## 2017-12-19 LAB — GLUCOSE, CAPILLARY: GLUCOSE-CAPILLARY: 138 mg/dL — AB (ref 65–99)

## 2017-12-19 IMAGING — CR DG LUMBAR SPINE 1V
1 series · 1 of 1 positions shown · non-contrast
Comparison: Lumbar MRI October 03, 2017

CLINICAL DATA: Laminectomy

EXAM:
LUMBAR SPINE - 1 VIEW

[lateral]
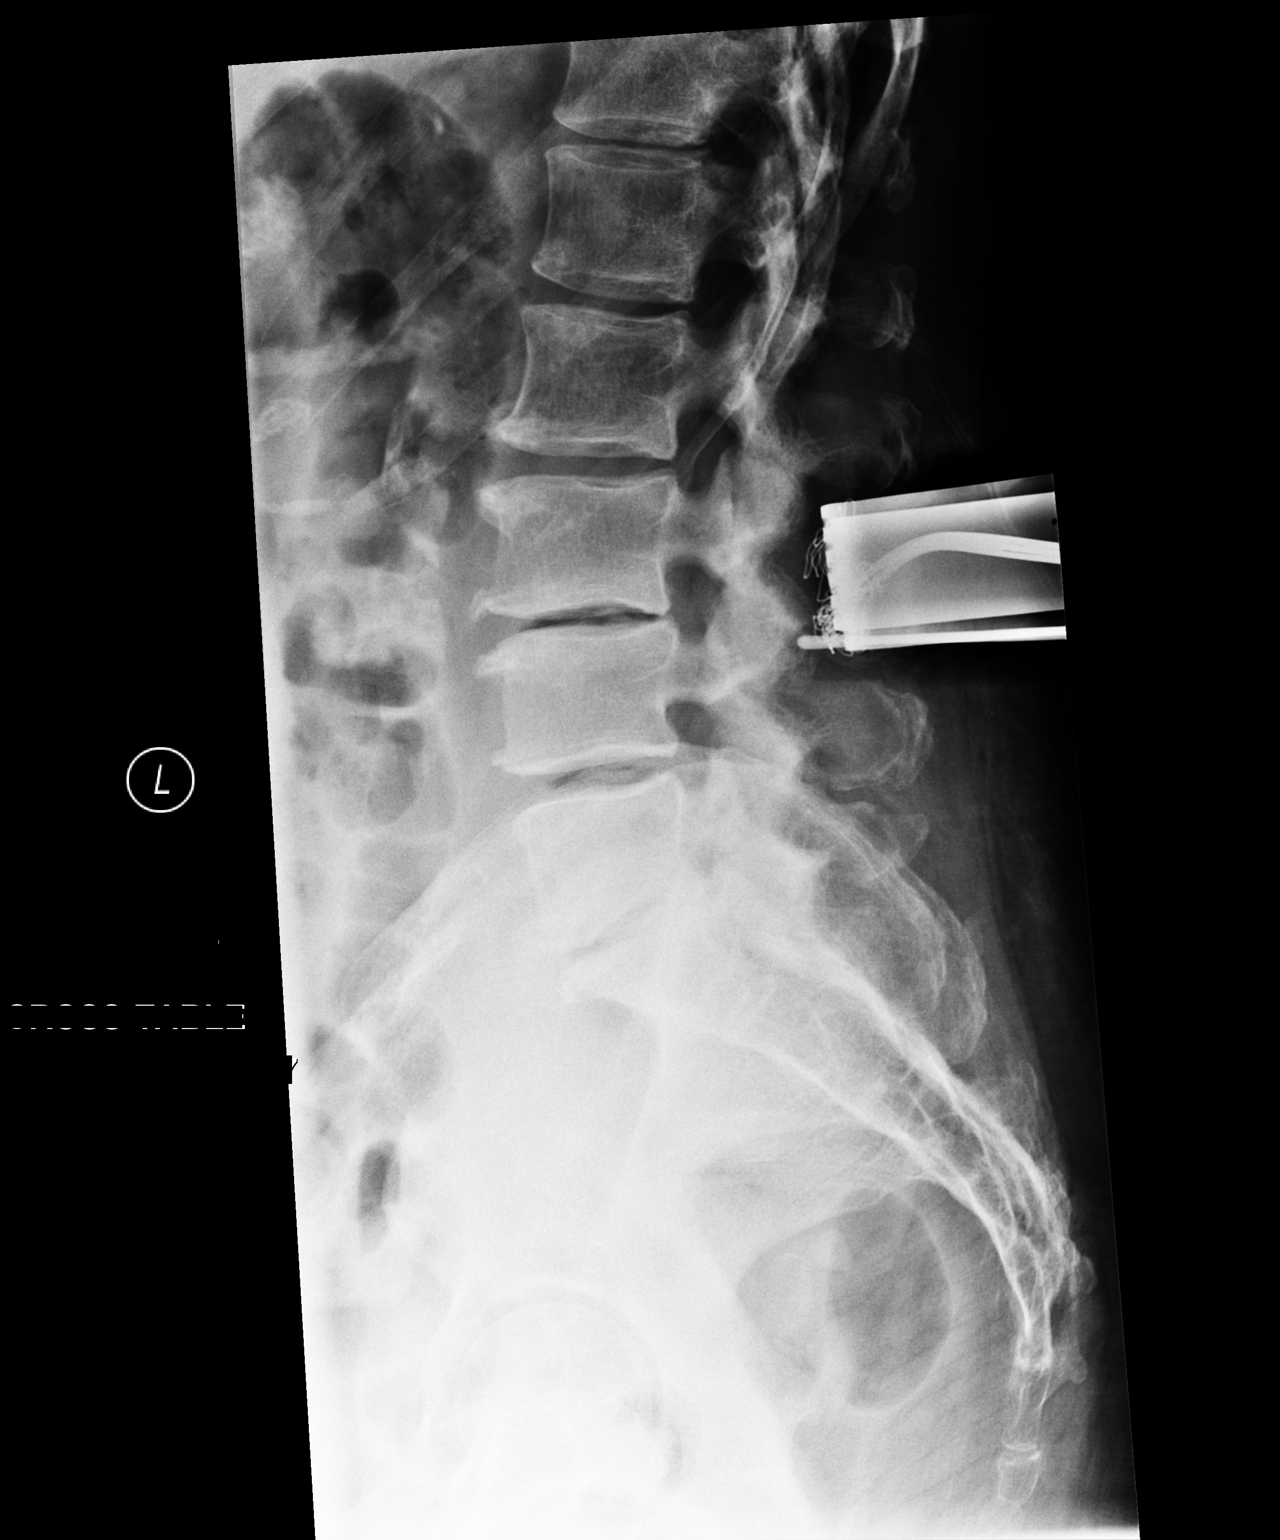

[1 of 1 positions shown; findings below may reference images not displayed]

FINDINGS: Metallic probe tip is posterior to the superior aspect of the L4
vertebral body. Cutting tool overlies the L3 spinous process. There
is marked disc space narrowing at L3-4, L4-5, and L5-S1 with vacuum
phenomenon at these levels. No fracture or spondylolisthesis.
IMPRESSION: Metallic probe tip is posterior to the superior aspect of the L4
vertebral body with cutting tool overlying the L3 spinous process.
Multilevel osteoarthritic change. No fracture or spondylolisthesis.

## 2017-12-19 SURGERY — LUMBAR LAMINECTOMY/DECOMPRESSION MICRODISCECTOMY 3 LEVELS
Anesthesia: General | Site: Back

## 2017-12-19 MED ORDER — GABAPENTIN 300 MG PO CAPS
600.0000 mg | ORAL_CAPSULE | Freq: Three times a day (TID) | ORAL | Status: DC
  Administered 2017-12-19 – 2017-12-22 (×8): 600 mg via ORAL
  Filled 2017-12-19 (×10): qty 2

## 2017-12-19 MED ORDER — ONDANSETRON HCL 4 MG/2ML IJ SOLN: 4.0000 mg | Freq: Four times a day (QID) | INTRAMUSCULAR | Status: DC | PRN

## 2017-12-19 MED ORDER — COQ10 100 MG PO CAPS: 100.0000 mg | ORAL_CAPSULE | Freq: Every day | ORAL | Status: DC

## 2017-12-19 MED ORDER — THROMBIN (RECOMBINANT) 5000 UNITS EX SOLR
CUTANEOUS | Status: DC | PRN
  Administered 2017-12-19 (×2): via TOPICAL

## 2017-12-19 MED ORDER — OMEGA-3-ACID ETHYL ESTERS 1 G PO CAPS
1000.0000 mg | ORAL_CAPSULE | Freq: Two times a day (BID) | ORAL | Status: DC
  Administered 2017-12-20 – 2017-12-22 (×4): 1000 mg via ORAL
  Filled 2017-12-19 (×6): qty 1

## 2017-12-19 MED ORDER — THROMBIN 5000 UNITS EX SOLR
CUTANEOUS | Status: AC
Start: 2017-12-19 — End: ?
  Filled 2017-12-19: qty 10000

## 2017-12-19 MED ORDER — HEMOSTATIC AGENTS (NO CHARGE) OPTIME
TOPICAL | Status: DC | PRN
  Administered 2017-12-19 (×2): 1 via TOPICAL

## 2017-12-19 MED ORDER — BISACODYL 10 MG RE SUPP: 10.0000 mg | Freq: Every day | RECTAL | Status: DC | PRN

## 2017-12-19 MED ORDER — ONDANSETRON HCL 4 MG/2ML IJ SOLN
INTRAMUSCULAR | Status: DC | PRN
  Administered 2017-12-19: 4 mg via INTRAVENOUS

## 2017-12-19 MED ORDER — ONDANSETRON HCL 4 MG/2ML IJ SOLN
INTRAMUSCULAR | Status: AC
  Filled 2017-12-19: qty 2

## 2017-12-19 MED ORDER — FENTANYL CITRATE (PF) 250 MCG/5ML IJ SOLN
INTRAMUSCULAR | Status: DC | PRN
  Administered 2017-12-19 (×3): 50 ug via INTRAVENOUS
  Administered 2017-12-19: 100 ug via INTRAVENOUS

## 2017-12-19 MED ORDER — VITAMIN D 1000 UNITS PO TABS
2000.0000 [IU] | ORAL_TABLET | Freq: Every day | ORAL | Status: DC
  Administered 2017-12-20 – 2017-12-21 (×2): 2000 [IU] via ORAL
  Filled 2017-12-19 (×2): qty 2

## 2017-12-19 MED ORDER — METHOCARBAMOL 1000 MG/10ML IJ SOLN
500.0000 mg | Freq: Four times a day (QID) | INTRAVENOUS | Status: DC | PRN
  Filled 2017-12-19: qty 5

## 2017-12-19 MED ORDER — DIAZEPAM 5 MG PO TABS
5.0000 mg | ORAL_TABLET | Freq: Once | ORAL | Status: AC
  Administered 2017-12-19: 5 mg via ORAL
  Filled 2017-12-19: qty 1

## 2017-12-19 MED ORDER — HYDROMORPHONE HCL 1 MG/ML IJ SOLN
INTRAMUSCULAR | Status: AC
  Administered 2017-12-19: 0.5 mg via INTRAVENOUS
  Filled 2017-12-19: qty 1

## 2017-12-19 MED ORDER — VANCOMYCIN HCL IN DEXTROSE 1-5 GM/200ML-% IV SOLN
1000.0000 mg | Freq: Once | INTRAVENOUS | Status: AC
  Administered 2017-12-19: 1000 mg via INTRAVENOUS
  Filled 2017-12-19: qty 200

## 2017-12-19 MED ORDER — EPHEDRINE SULFATE-NACL 50-0.9 MG/10ML-% IV SOSY
PREFILLED_SYRINGE | INTRAVENOUS | Status: DC | PRN
  Administered 2017-12-19 (×2): 10 mg via INTRAVENOUS

## 2017-12-19 MED ORDER — MAGNESIUM CITRATE PO SOLN: 1.0000 | Freq: Every day | ORAL | Status: DC

## 2017-12-19 MED ORDER — FLUTICASONE PROPIONATE 50 MCG/ACT NA SUSP
1.0000 | Freq: Every day | NASAL | Status: DC
  Administered 2017-12-20: 1 via NASAL
  Filled 2017-12-19: qty 16

## 2017-12-19 MED ORDER — 0.9 % SODIUM CHLORIDE (POUR BTL) OPTIME
TOPICAL | Status: DC | PRN
  Administered 2017-12-19: 1000 mL

## 2017-12-19 MED ORDER — ROCURONIUM BROMIDE 10 MG/ML (PF) SYRINGE
PREFILLED_SYRINGE | INTRAVENOUS | Status: AC
  Filled 2017-12-19: qty 5

## 2017-12-19 MED ORDER — MIDAZOLAM HCL 2 MG/2ML IJ SOLN
INTRAMUSCULAR | Status: AC
  Filled 2017-12-19: qty 2

## 2017-12-19 MED ORDER — LIDOCAINE-EPINEPHRINE 1 %-1:100000 IJ SOLN
INTRAMUSCULAR | Status: DC | PRN
  Administered 2017-12-19: 5 mL

## 2017-12-19 MED ORDER — THROMBIN 5000 UNITS EX SOLR
CUTANEOUS | Status: AC
  Filled 2017-12-19: qty 10000

## 2017-12-19 MED ORDER — LIDOCAINE HCL (CARDIAC) 20 MG/ML IV SOLN
INTRAVENOUS | Status: AC
  Filled 2017-12-19: qty 5

## 2017-12-19 MED ORDER — CEFAZOLIN SODIUM-DEXTROSE 2-4 GM/100ML-% IV SOLN: 2.0000 g | Freq: Three times a day (TID) | INTRAVENOUS | Status: DC

## 2017-12-19 MED ORDER — FENTANYL CITRATE (PF) 250 MCG/5ML IJ SOLN
INTRAMUSCULAR | Status: AC
  Filled 2017-12-19: qty 5

## 2017-12-19 MED ORDER — OXYCODONE HCL 5 MG/5ML PO SOLN: 5.0000 mg | Freq: Once | ORAL | Status: DC | PRN

## 2017-12-19 MED ORDER — PROPRANOLOL HCL 10 MG PO TABS
10.0000 mg | ORAL_TABLET | Freq: Every day | ORAL | Status: DC | PRN
  Administered 2017-12-19: 10 mg via ORAL
  Filled 2017-12-19 (×4): qty 1

## 2017-12-19 MED ORDER — SODIUM CHLORIDE 0.9 % IV SOLN: INTRAVENOUS | Status: DC

## 2017-12-19 MED ORDER — RISAQUAD PO CAPS
1.0000 | ORAL_CAPSULE | Freq: Every day | ORAL | Status: DC
  Administered 2017-12-20 – 2017-12-21 (×2): 1 via ORAL
  Filled 2017-12-19 (×3): qty 1

## 2017-12-19 MED ORDER — LOPERAMIDE HCL 2 MG PO CAPS
2.0000 mg | ORAL_CAPSULE | Freq: Two times a day (BID) | ORAL | Status: DC | PRN
  Filled 2017-12-19: qty 2

## 2017-12-19 MED ORDER — PROPOFOL 10 MG/ML IV BOLUS
INTRAVENOUS | Status: DC | PRN
  Administered 2017-12-19: 150 mg via INTRAVENOUS

## 2017-12-19 MED ORDER — PROPOFOL 10 MG/ML IV BOLUS
INTRAVENOUS | Status: AC
  Filled 2017-12-19: qty 20

## 2017-12-19 MED ORDER — FENTANYL CITRATE (PF) 100 MCG/2ML IJ SOLN
25.0000 ug | INTRAMUSCULAR | Status: DC | PRN
  Administered 2017-12-19 (×2): 50 ug via INTRAVENOUS

## 2017-12-19 MED ORDER — OXYCODONE HCL 5 MG PO TABS
10.0000 mg | ORAL_TABLET | ORAL | Status: DC | PRN
  Administered 2017-12-19 – 2017-12-22 (×4): 10 mg via ORAL
  Filled 2017-12-19 (×4): qty 2

## 2017-12-19 MED ORDER — PROMETHAZINE HCL 25 MG/ML IJ SOLN
6.2500 mg | Freq: Once | INTRAMUSCULAR | Status: AC
  Administered 2017-12-19: 6.25 mg via INTRAVENOUS

## 2017-12-19 MED ORDER — PHENYLEPHRINE HCL 10 MG/ML IJ SOLN
INTRAVENOUS | Status: DC | PRN
  Administered 2017-12-19: 20 ug/min via INTRAVENOUS

## 2017-12-19 MED ORDER — OXYCODONE HCL 5 MG PO TABS: 5.0000 mg | ORAL_TABLET | Freq: Once | ORAL | Status: DC | PRN

## 2017-12-19 MED ORDER — SODIUM CHLORIDE 0.9% FLUSH: 3.0000 mL | INTRAVENOUS | Status: DC | PRN

## 2017-12-19 MED ORDER — DOCUSATE SODIUM 100 MG PO CAPS
100.0000 mg | ORAL_CAPSULE | Freq: Two times a day (BID) | ORAL | Status: DC
  Administered 2017-12-20 – 2017-12-22 (×5): 100 mg via ORAL
  Filled 2017-12-19 (×6): qty 1

## 2017-12-19 MED ORDER — LIDOCAINE-EPINEPHRINE 1 %-1:100000 IJ SOLN
INTRAMUSCULAR | Status: AC
  Filled 2017-12-19: qty 1

## 2017-12-19 MED ORDER — MIDAZOLAM HCL 5 MG/5ML IJ SOLN
INTRAMUSCULAR | Status: DC | PRN
  Administered 2017-12-19: 2 mg via INTRAVENOUS

## 2017-12-19 MED ORDER — PROMETHAZINE HCL 25 MG/ML IJ SOLN
INTRAMUSCULAR | Status: AC
  Administered 2017-12-19: 6.25 mg via INTRAVENOUS
  Filled 2017-12-19: qty 1

## 2017-12-19 MED ORDER — SUGAMMADEX SODIUM 200 MG/2ML IV SOLN
INTRAVENOUS | Status: AC
  Filled 2017-12-19: qty 2

## 2017-12-19 MED ORDER — ROYAL JELLY 200 MG PO CAPS: 150.0000 mg | ORAL_CAPSULE | Freq: Every day | ORAL | Status: DC

## 2017-12-19 MED ORDER — THROMBIN 5000 UNITS EX SOLR
CUTANEOUS | Status: DC | PRN
  Administered 2017-12-19 (×2): 5000 [IU] via TOPICAL

## 2017-12-19 MED ORDER — MORPHINE SULFATE (PF) 4 MG/ML IV SOLN
4.0000 mg | INTRAVENOUS | Status: DC | PRN
  Administered 2017-12-20 – 2017-12-21 (×2): 4 mg via INTRAVENOUS
  Filled 2017-12-19 (×2): qty 1

## 2017-12-19 MED ORDER — GLYCOPYRROLATE 0.2 MG/ML IV SOSY
PREFILLED_SYRINGE | INTRAVENOUS | Status: DC | PRN
  Administered 2017-12-19: .2 mg via INTRAVENOUS

## 2017-12-19 MED ORDER — SENNA 8.6 MG PO TABS
1.0000 | ORAL_TABLET | Freq: Two times a day (BID) | ORAL | Status: DC
  Administered 2017-12-20 – 2017-12-22 (×5): 8.6 mg via ORAL
  Filled 2017-12-19 (×6): qty 1

## 2017-12-19 MED ORDER — METHOCARBAMOL 500 MG PO TABS
500.0000 mg | ORAL_TABLET | Freq: Four times a day (QID) | ORAL | Status: DC | PRN
  Administered 2017-12-20 – 2017-12-22 (×3): 500 mg via ORAL
  Filled 2017-12-19 (×3): qty 1

## 2017-12-19 MED ORDER — DEXAMETHASONE SODIUM PHOSPHATE 10 MG/ML IJ SOLN
INTRAMUSCULAR | Status: AC
  Filled 2017-12-19: qty 1

## 2017-12-19 MED ORDER — CALCIUM CITRATE 950 (200 CA) MG PO TABS
400.0000 mg | ORAL_TABLET | Freq: Every day | ORAL | Status: DC
  Administered 2017-12-21: 400 mg via ORAL
  Filled 2017-12-19 (×4): qty 2

## 2017-12-19 MED ORDER — QUETIAPINE FUMARATE 50 MG PO TABS
150.0000 mg | ORAL_TABLET | Freq: Every day | ORAL | Status: DC
  Administered 2017-12-19 – 2017-12-21 (×3): 150 mg via ORAL
  Filled 2017-12-19 (×5): qty 1

## 2017-12-19 MED ORDER — PANTOPRAZOLE SODIUM 40 MG IV SOLR
40.0000 mg | Freq: Every day | INTRAVENOUS | Status: DC
  Filled 2017-12-19: qty 40

## 2017-12-19 MED ORDER — VITAMIN E 180 MG (400 UNIT) PO CAPS
400.0000 [IU] | ORAL_CAPSULE | Freq: Every day | ORAL | Status: DC
  Administered 2017-12-20 – 2017-12-21 (×2): 400 [IU] via ORAL
  Filled 2017-12-19 (×3): qty 1

## 2017-12-19 MED ORDER — METOPROLOL TARTRATE 50 MG PO TABS
75.0000 mg | ORAL_TABLET | ORAL | Status: AC
  Administered 2017-12-19: 75 mg via ORAL
  Filled 2017-12-19: qty 1

## 2017-12-19 MED ORDER — BUPIVACAINE HCL (PF) 0.5 % IJ SOLN
INTRAMUSCULAR | Status: AC
  Filled 2017-12-19: qty 30

## 2017-12-19 MED ORDER — DEXAMETHASONE SODIUM PHOSPHATE 10 MG/ML IJ SOLN
INTRAMUSCULAR | Status: DC | PRN
  Administered 2017-12-19: 10 mg via INTRAVENOUS

## 2017-12-19 MED ORDER — BUPIVACAINE HCL (PF) 0.5 % IJ SOLN
INTRAMUSCULAR | Status: DC | PRN
  Administered 2017-12-19: 5 mL

## 2017-12-19 MED ORDER — HYDROXYZINE HCL 50 MG/ML IM SOLN: 50.0000 mg | Freq: Four times a day (QID) | INTRAMUSCULAR | Status: DC | PRN

## 2017-12-19 MED ORDER — LACTATED RINGERS IV SOLN
INTRAVENOUS | Status: DC
  Administered 2017-12-19 (×2): via INTRAVENOUS

## 2017-12-19 MED ORDER — SODIUM CHLORIDE 0.9% FLUSH
3.0000 mL | Freq: Two times a day (BID) | INTRAVENOUS | Status: DC
  Administered 2017-12-19 – 2017-12-22 (×4): 3 mL via INTRAVENOUS

## 2017-12-19 MED ORDER — METOPROLOL TARTRATE 12.5 MG HALF TABLET
ORAL_TABLET | ORAL | Status: AC
  Filled 2017-12-19: qty 2

## 2017-12-19 MED ORDER — SUGAMMADEX SODIUM 200 MG/2ML IV SOLN
INTRAVENOUS | Status: DC | PRN
  Administered 2017-12-19: 190 mg via INTRAVENOUS

## 2017-12-19 MED ORDER — ONDANSETRON HCL 4 MG PO TABS: 4.0000 mg | ORAL_TABLET | Freq: Four times a day (QID) | ORAL | Status: DC | PRN

## 2017-12-19 MED ORDER — PHENOL 1.4 % MT LIQD: 1.0000 | OROMUCOSAL | Status: DC | PRN

## 2017-12-19 MED ORDER — LIDOCAINE 2% (20 MG/ML) 5 ML SYRINGE
INTRAMUSCULAR | Status: DC | PRN
  Administered 2017-12-19: 60 mg via INTRAVENOUS

## 2017-12-19 MED ORDER — EPHEDRINE 5 MG/ML INJ
INTRAVENOUS | Status: AC
  Filled 2017-12-19: qty 10

## 2017-12-19 MED ORDER — METOPROLOL TARTRATE 50 MG PO TABS
ORAL_TABLET | ORAL | Status: AC
  Filled 2017-12-19: qty 1

## 2017-12-19 MED ORDER — FENTANYL CITRATE (PF) 100 MCG/2ML IJ SOLN
INTRAMUSCULAR | Status: AC
  Administered 2017-12-19: 50 ug via INTRAVENOUS
  Filled 2017-12-19: qty 2

## 2017-12-19 MED ORDER — PHENYLEPHRINE HCL 10 MG/ML IJ SOLN
INTRAMUSCULAR | Status: AC
  Filled 2017-12-19: qty 1

## 2017-12-19 MED ORDER — GLUCOSAMINE-CHONDROITIN 500-400 MG PO CAPS: ORAL_CAPSULE | Freq: Every day | ORAL | Status: DC

## 2017-12-19 MED ORDER — CALCIUM CITRATE 950 (200 CA) MG PO TABS
200.0000 mg | ORAL_TABLET | Freq: Every day | ORAL | Status: DC
  Administered 2017-12-21: 200 mg via ORAL
  Filled 2017-12-19 (×3): qty 1

## 2017-12-19 MED ORDER — KETOROLAC TROMETHAMINE 15 MG/ML IJ SOLN
7.5000 mg | Freq: Four times a day (QID) | INTRAMUSCULAR | Status: AC
  Administered 2017-12-19 – 2017-12-20 (×3): 7.5 mg via INTRAVENOUS
  Filled 2017-12-19 (×3): qty 1

## 2017-12-19 MED ORDER — ACETAMINOPHEN 650 MG RE SUPP: 650.0000 mg | RECTAL | Status: DC | PRN

## 2017-12-19 MED ORDER — METOPROLOL TARTRATE 50 MG PO TABS
75.0000 mg | ORAL_TABLET | Freq: Two times a day (BID) | ORAL | Status: DC
  Administered 2017-12-19 – 2017-12-22 (×6): 75 mg via ORAL
  Filled 2017-12-19 (×4): qty 1
  Filled 2017-12-19: qty 3
  Filled 2017-12-19: qty 1

## 2017-12-19 MED ORDER — SUCCINYLCHOLINE CHLORIDE 20 MG/ML IJ SOLN
INTRAMUSCULAR | Status: AC
  Filled 2017-12-19: qty 2

## 2017-12-19 MED ORDER — MENTHOL 3 MG MT LOZG: 1.0000 | LOZENGE | OROMUCOSAL | Status: DC | PRN

## 2017-12-19 MED ORDER — ONDANSETRON HCL 4 MG/2ML IJ SOLN
INTRAMUSCULAR | Status: AC
  Administered 2017-12-19: 4 mg via INTRAVENOUS
  Filled 2017-12-19: qty 2

## 2017-12-19 MED ORDER — ROCURONIUM BROMIDE 10 MG/ML (PF) SYRINGE
PREFILLED_SYRINGE | INTRAVENOUS | Status: DC | PRN
  Administered 2017-12-19: 60 mg via INTRAVENOUS

## 2017-12-19 MED ORDER — SODIUM CHLORIDE 0.9 % IR SOLN
Status: DC | PRN
  Administered 2017-12-19: 14:00:00

## 2017-12-19 MED ORDER — ONDANSETRON HCL 4 MG/2ML IJ SOLN
4.0000 mg | Freq: Once | INTRAMUSCULAR | Status: AC | PRN
  Administered 2017-12-19: 4 mg via INTRAVENOUS

## 2017-12-19 MED ORDER — HYDROMORPHONE HCL 1 MG/ML IJ SOLN
0.5000 mg | INTRAMUSCULAR | Status: AC | PRN
  Administered 2017-12-19 (×4): 0.5 mg via INTRAVENOUS

## 2017-12-19 MED ORDER — ACETAMINOPHEN 325 MG PO TABS
650.0000 mg | ORAL_TABLET | ORAL | Status: DC | PRN
Start: 2017-12-19 — End: 2017-12-22

## 2017-12-19 MED ORDER — FLORA-Q PO CAPS
ORAL_CAPSULE | Freq: Every day | ORAL | Status: DC
  Filled 2017-12-19: qty 1

## 2017-12-19 MED ORDER — DIAZEPAM 5 MG PO TABS: 5.0000 mg | ORAL_TABLET | Freq: Four times a day (QID) | ORAL | Status: DC | PRN

## 2017-12-19 MED ORDER — CALCIUM CITRATE 150 MG PO CAPS: ORAL_CAPSULE | Freq: Two times a day (BID) | ORAL | Status: DC

## 2017-12-19 MED ORDER — HYDROCODONE-ACETAMINOPHEN 5-325 MG PO TABS
1.0000 | ORAL_TABLET | ORAL | Status: DC | PRN
  Administered 2017-12-20 – 2017-12-22 (×6): 1 via ORAL
  Filled 2017-12-19 (×6): qty 1

## 2017-12-19 SURGICAL SUPPLY — 68 items
ADH SKN CLS APL DERMABOND .7 (GAUZE/BANDAGES/DRESSINGS) ×1
APL SKNCLS STERI-STRIP NONHPOA (GAUZE/BANDAGES/DRESSINGS)
BAG DECANTER FOR FLEXI CONT (MISCELLANEOUS) ×2 IMPLANT
BENZOIN TINCTURE PRP APPL 2/3 (GAUZE/BANDAGES/DRESSINGS) IMPLANT
BLADE CLIPPER SURG (BLADE) IMPLANT
BLADE SURG 11 STRL SS (BLADE) ×2 IMPLANT
BUR MATCHSTICK NEURO 3.0 LAGG (BURR) ×2 IMPLANT
BUR PRECISION FLUTE 5.0 (BURR) ×1 IMPLANT
CANISTER SUCT 3000ML PPV (MISCELLANEOUS) ×2 IMPLANT
CARTRIDGE OIL MAESTRO DRILL (MISCELLANEOUS) ×1 IMPLANT
DECANTER SPIKE VIAL GLASS SM (MISCELLANEOUS) ×2 IMPLANT
DERMABOND ADVANCED (GAUZE/BANDAGES/DRESSINGS) ×1
DERMABOND ADVANCED .7 DNX12 (GAUZE/BANDAGES/DRESSINGS) ×1 IMPLANT
DIFFUSER DRILL AIR PNEUMATIC (MISCELLANEOUS) ×2 IMPLANT
DRAPE LAPAROTOMY 100X72X124 (DRAPES) ×2 IMPLANT
DRAPE MICROSCOPE LEICA (MISCELLANEOUS) ×1 IMPLANT
DRAPE SURG 17X23 STRL (DRAPES) ×1 IMPLANT
DRSG OPSITE POSTOP 4X8 (GAUZE/BANDAGES/DRESSINGS) ×1 IMPLANT
DURAPREP 26ML APPLICATOR (WOUND CARE) ×2 IMPLANT
ELECT REM PT RETURN 9FT ADLT (ELECTROSURGICAL) ×2
ELECTRODE REM PT RTRN 9FT ADLT (ELECTROSURGICAL) ×1 IMPLANT
GAUZE SPONGE 4X4 12PLY STRL (GAUZE/BANDAGES/DRESSINGS) IMPLANT
GAUZE SPONGE 4X4 16PLY XRAY LF (GAUZE/BANDAGES/DRESSINGS) IMPLANT
GLOVE BIO SURGEON STRL SZ 6.5 (GLOVE) ×2 IMPLANT
GLOVE BIO SURGEON STRL SZ7 (GLOVE) IMPLANT
GLOVE BIOGEL PI IND STRL 6.5 (GLOVE) IMPLANT
GLOVE BIOGEL PI IND STRL 7.0 (GLOVE) IMPLANT
GLOVE BIOGEL PI IND STRL 7.5 (GLOVE) ×1 IMPLANT
GLOVE BIOGEL PI INDICATOR 6.5 (GLOVE) ×1
GLOVE BIOGEL PI INDICATOR 7.0 (GLOVE) ×4
GLOVE BIOGEL PI INDICATOR 7.5 (GLOVE) ×3
GLOVE ECLIPSE 6.5 STRL STRAW (GLOVE) ×1 IMPLANT
GLOVE ECLIPSE 7.0 STRL STRAW (GLOVE) ×2 IMPLANT
GLOVE EXAM NITRILE LRG STRL (GLOVE) IMPLANT
GLOVE EXAM NITRILE XL STR (GLOVE) IMPLANT
GLOVE EXAM NITRILE XS STR PU (GLOVE) IMPLANT
GOWN STRL REUS W/ TWL LRG LVL3 (GOWN DISPOSABLE) ×2 IMPLANT
GOWN STRL REUS W/ TWL XL LVL3 (GOWN DISPOSABLE) IMPLANT
GOWN STRL REUS W/TWL 2XL LVL3 (GOWN DISPOSABLE) IMPLANT
GOWN STRL REUS W/TWL LRG LVL3 (GOWN DISPOSABLE) ×6
GOWN STRL REUS W/TWL XL LVL3 (GOWN DISPOSABLE) ×4
HEMOSTAT POWDER KIT SURGIFOAM (HEMOSTASIS) ×3 IMPLANT
KIT BASIN OR (CUSTOM PROCEDURE TRAY) ×2 IMPLANT
KIT ROOM TURNOVER OR (KITS) ×2 IMPLANT
NDL HYPO 18GX1.5 BLUNT FILL (NEEDLE) IMPLANT
NDL SPNL 18GX3.5 QUINCKE PK (NEEDLE) IMPLANT
NEEDLE HYPO 18GX1.5 BLUNT FILL (NEEDLE) IMPLANT
NEEDLE HYPO 22GX1.5 SAFETY (NEEDLE) ×2 IMPLANT
NEEDLE SPNL 18GX3.5 QUINCKE PK (NEEDLE) ×2 IMPLANT
NS IRRIG 1000ML POUR BTL (IV SOLUTION) ×2 IMPLANT
OIL CARTRIDGE MAESTRO DRILL (MISCELLANEOUS) ×2
PACK LAMINECTOMY NEURO (CUSTOM PROCEDURE TRAY) ×2 IMPLANT
PAD ARMBOARD 7.5X6 YLW CONV (MISCELLANEOUS) ×6 IMPLANT
PATTIES SURGICAL .5 X.5 (GAUZE/BANDAGES/DRESSINGS) ×1 IMPLANT
RUBBERBAND STERILE (MISCELLANEOUS) ×2 IMPLANT
SEALANT ADHERUS EXTEND TIP (MISCELLANEOUS) ×1 IMPLANT
SPONGE LAP 4X18 X RAY DECT (DISPOSABLE) IMPLANT
SPONGE SURGIFOAM ABS GEL SZ50 (HEMOSTASIS) ×2 IMPLANT
STRIP CLOSURE SKIN 1/2X4 (GAUZE/BANDAGES/DRESSINGS) IMPLANT
SUT PROLENE 6 0 BV (SUTURE) ×1 IMPLANT
SUT VIC AB 0 CT1 18XCR BRD8 (SUTURE) ×1 IMPLANT
SUT VIC AB 0 CT1 8-18 (SUTURE) ×6
SUT VIC AB 2-0 CT1 18 (SUTURE) IMPLANT
SUT VICRYL 3-0 RB1 18 ABS (SUTURE) ×5 IMPLANT
SYR 3ML LL SCALE MARK (SYRINGE) IMPLANT
TOWEL GREEN STERILE (TOWEL DISPOSABLE) ×2 IMPLANT
TOWEL GREEN STERILE FF (TOWEL DISPOSABLE) ×2 IMPLANT
WATER STERILE IRR 1000ML POUR (IV SOLUTION) ×2 IMPLANT

## 2017-12-19 NOTE — Anesthesia Procedure Notes (Signed)
Procedure Name: Intubation Date/Time: 12/19/2017 12:43 PM Performed by: Harden Mo, CRNA Pre-anesthesia Checklist: Patient identified, Emergency Drugs available, Suction available and Patient being monitored Patient Re-evaluated:Patient Re-evaluated prior to induction Oxygen Delivery Method: Circle System Utilized Preoxygenation: Pre-oxygenation with 100% oxygen Induction Type: IV induction Ventilation: Mask ventilation without difficulty and Oral airway inserted - appropriate to patient size Laryngoscope Size: Sabra Heck and 2 Grade View: Grade II Tube type: Oral Tube size: 7.0 mm Number of attempts: 1 Airway Equipment and Method: Stylet and Oral airway Placement Confirmation: ETT inserted through vocal cords under direct vision,  positive ETCO2 and breath sounds checked- equal and bilateral Secured at: 22 cm Tube secured with: Tape Dental Injury: Teeth and Oropharynx as per pre-operative assessment

## 2017-12-19 NOTE — Op Note (Signed)
PREOP DIAGNOSIS: Lumbar spinal stenosis, L2-L5  POSTOP DIAGNOSIS: Same  PROCEDURE: 1. L2-L5 laminectomy  SURGEON: Dr. Consuella Lose, MD  ASSISTANT: Dr. Ashok Pall, MD  ANESTHESIA: General Endotracheal  EBL: 200cc  SPECIMENS: None  DRAINS: None  COMPLICATIONS: None immediate  CONDITION: Stable to PCAU  HISTORY: Kristin Gilmore is a 67 y.o. female who initially presented to the outpatient clinic with primarily leg pain, to a lesser extent back pain. She underwent MRI scan demonstrating multilevel lumbar stenosis worst at L2-L5. She attempted multiple conservative treatments including medication, physical therapy, and injections. She therefore elected to proceed with surgical decompression.  PROCEDURE IN DETAIL: After informed consent was obtained and witnessed, the patient was brought to the operating room. After induction of general anesthesia, the patient was positioned on the operative table in the prone position with all pressure points meticulously padded. The skin of the low back was then prepped and draped in the usual sterile fashion.  Under fluoroscopy, the correct levels were identified and marked out on the skin, and after timeout was conducted, the skin was infiltrated with local anesthetic. Skin incision was then made sharply and Bovie electrocautery was used to dissect the subcutaneous tissue until the lumbodorsal fascia was identified. The fascia was then incised using Bovie electrocautery and the lamina at the L2 through L5 levels was identified and dissection was carried out in the subperiosteal plane. Self-retaining retractor was then placed, and intraoperative x-ray was taken to confirm we were at the correct levels.  Using a high-speed drill, laminectomies were completed extending from the inferior half of L2 through the superior half of L5. The ligamentum flavum was then identified and removed and the lateral edge of the thecal sac was identified  bilaterally. Using a combination of the high-speed drill and rongeurs, good lateral decompression was completed. The decompression was confirmed using a small ball tip dissector.  Hemostasis was then secured using a combination of morcellized Gelfoam and thrombin and bipolar electrocautery. The wound is irrigated with copious amounts of antibiotic saline irrigation.  During decompression around the left side at L5, a small durotomy was created. After complete decompression, this was reapproximated primarily with 6-0 Prolene and covered with Adherus dural sealant.   Self-retaining retractor was then removed, and the wound was closed in layers using a combination of interrupted 0 Vicryl and 3-0 Vicryl stitches. The skin was closed using standard skin glue.  At the end of the case all sponge, needle, and instrument counts were correct. The patient was then transferred to the stretcher and taken to the postanesthesia care unit in stable hemodynamic condition.

## 2017-12-19 NOTE — H&P (Signed)
Chief Complaint  Back and leg pain.  History of Present Illness  Kristin Gilmore is a 67 y.o. female I am seeing with a history of relatively severe, chronic primarily leg pain. She has been managed with multiple different conservative treatments. At our last visit I suggested a follow-up with Dr. Tonita Cong who is her orthopedic surgeon. We did discuss at our last visit the possibility of surgical treatment of her lumbar spine. In the interim, she has followed up with Dr. Tonita Cong, and was referred to Dr. Nelva Bush where she underwent a facet injection bilaterally. Unfortunately, she says this injection did not provide any relief. She does tell me that in addition to her back pain she is also having some component of posterior thigh and calf pain.  Past Medical History   Past Medical History:  Diagnosis Date  . Arthritis   . Depression   . Dyslipidemia   . Hypertension   . Migraine   . Migraine without aura, without mention of intractable migraine without mention of status migrainosus 04/13/2014  . Pituitary microadenoma ([place]) 04/13/2014  . Pre-diabetes    "i was told i was pre-diabetic a while ago"  . Tremor 04/13/2014    Past Surgical History   Past Surgical History:  Procedure Laterality Date  . CHOLECYSTECTOMY    . COLONOSCOPY W/ POLYPECTOMY    . NASAL SINUS SURGERY    . TONSILLECTOMY    . WISDOM TOOTH EXTRACTION      Social History   Social History   Tobacco Use  . Smoking status: Never Smoker  . Smokeless tobacco: Never Used  Substance Use Topics  . Alcohol use: Yes    Alcohol/week: 0.0 oz    Comment: occas.  . Drug use: No    Medications   Prior to Admission medications   Medication Sig Start Date End Date Taking? Authorizing Provider  acetaminophen (TYLENOL) 500 MG tablet Take 1,000 mg by mouth 2 (two) times daily.    Yes [provider]  Calcium Citrate (CAL-CITRATE PO) Take 1-2 tablets by mouth 2 (two) times daily. TAKE 1 TABLET IN THE MORNING &  TAKE 2 TABLETS WITH SUPPER   Yes [provider]  Cholecalciferol (VITAMIN D3) 2000 UNITS TABS Take 2,000 Units by mouth daily with supper.    Yes [provider]  Coenzyme Q10 (COQ10 PO) Take 100 mg by mouth daily with supper. H-Eq10 (CoQ10 with Hemp)   Yes [provider]  Coenzyme Q10 (COQ10) 100 MG CAPS Take 100 mg by mouth daily with supper.   Yes [provider]  fluticasone (FLONASE) 50 MCG/ACT nasal spray Place 1 spray into both nostrils daily.  01/18/17  Yes [provider]  gabapentin (NEURONTIN) 300 MG capsule Take 600 mg by mouth 3 (three) times daily. 11/13/17  Yes [provider]  Glucosamine-Chondroitin (COSAMIN DS PO) Take 3 capsules by mouth daily. 1500 glucosamine   Yes [provider]  MAGNESIUM CITRATE PO Take 150 mg by mouth at bedtime.   Yes [provider]  metoprolol (LOPRESSOR) 50 MG tablet Take 75 mg by mouth 2 (two) times daily. With supper & with breakfast   Yes [provider]  Omega 3 1000 MG CAPS Take 1,000 mg by mouth 2 (two) times daily. SUPER OMEGA-3   Yes [provider]  OVER THE COUNTER MEDICATION Take 2 capsules by mouth daily with supper. Quick 6 Weight Loss Supplement   Yes [provider]  Probiotic Product (PROBIOTIC PO) Take 1  capsule by mouth daily. SUPREMA DOPHILUS 5 BILLION CFU   Yes [provider]  propranolol (INDERAL) 10 MG tablet Take 10 mg by mouth daily as needed (for hand tremors).   Yes [provider]  Red Yeast Rice 600 MG CAPS Take 1,200 mg by mouth 2 (two) times daily.   Yes [provider]  ROYAL JELLY PO Take 150 mg by mouth daily.   Yes [provider]  TURMERIC PO Take 475 mg by mouth 2 (two) times daily. WITH FOOD   Yes [provider]  vitamin E 400 UNIT capsule Take 400 Units by mouth daily with supper.    Yes [provider]  loperamide (IMODIUM) 2 MG capsule Take 2-4 mg by mouth 2  (two) times daily as needed for diarrhea or loose stools.    [provider]  naproxen sodium (ALEVE) 220 MG tablet Take 220 mg by mouth 2 (two) times daily as needed (for pain.).    [provider]  QUEtiapine (SEROQUEL) 300 MG tablet Take 150 mg by mouth at bedtime.     [provider]    Allergies   Allergies  Allergen Reactions  . Biaxin [Clarithromycin] Hives  . Amoxicillin     UNSPECIFIED REACTION  Has patient had a PCN reaction causing immediate rash, facial/tongue/throat swelling, SOB or lightheadedness with hypotension: Unknown Has patient had a PCN reaction causing severe rash involving mucus membranes or skin necrosis: Unknown Has patient had a PCN reaction that required hospitalization: Unknown Has patient had a PCN reaction occurring within the last 10 years: No If all of the above answers are "NO", then may proceed with Cephalosporin use.     Review of Systems  ROS  Neurologic Exam  Awake, alert, oriented Memory and concentration grossly intact Speech fluent, appropriate CN grossly intact Motor exam: Upper Extremities Deltoid Bicep Tricep Grip  Right 5/5 5/5 5/5 5/5  Left 5/5 5/5 5/5 5/5   Lower Extremities IP Quad PF DF EHL  Right 5/5 5/5 5/5 5/5 5/5  Left 5/5 5/5 5/5 5/5 5/5   Sensation grossly intact to LT  Imaging  MRI of the lumbar spine dated 10/03/2017 was reviewed. This demonstrates relatively preserved lumbar alignment. There is significant disc degeneration and loss of height at L2-3, L3-4, and L5-S1, and to a lesser extent L4-5. There is broad-based disc bulge with ligamentous hypertrophy and bilateral facet arthropathy which contribute to moderate to severe stenosis at L2-3 and L3-4, and moderate stenosis at L4-5.   Impression  67 year old woman with primarily leg pain which may be related at least in part to lumbar stenosis at L2-3, L3-4, and to a lesser extent at L4-5. She has undergone multiple different  conservative treatments including medical therapy, physical therapy, injection therapy.   Plan  we will plan on proceeding with decompressive laminectomy extending from bottom of L2 to the top of L5  I have extensively reviewed over multiple office visits the treatment options with the patient and her husband. These include continuing with conservative treatment versus the surgical decompression. Risks of surgery have also been discussed extensively. All their questions were answered. At this point they are ready to proceed with decompressive laminectomy, and consent was obtained.

## 2017-12-19 NOTE — Transfer of Care (Signed)
Immediate Anesthesia Transfer of Care Note  Patient: Kristin Gilmore  Procedure(s) Performed: LAMINECTOMY LUMBAR TWO- LUMBAR THREE, LUMBAR THREE- LUMBAR FOUR, LUMBAR FOUR- LUMBAR FIVE  (N/A Back)  Patient Location: PACU  Anesthesia Type:General  Level of Consciousness: awake and patient cooperative  Airway & Oxygen Therapy: Patient Spontanous Breathing  Post-op Assessment: Report given to RN and Post -op Vital signs reviewed and stable  Post vital signs: Reviewed and stable  Last Vitals:  Vitals Value Taken Time  BP    Temp    Pulse    Resp    SpO2      Last Pain:  Vitals:   12/19/17 1031  TempSrc:   PainSc: 3          Complications: No apparent anesthesia complications

## 2017-12-19 NOTE — Anesthesia Preprocedure Evaluation (Addendum)
Anesthesia Evaluation  Patient identified by MRN, date of birth, ID band Patient awake    Reviewed: Allergy & Precautions, NPO status , Patient's Chart, lab work & pertinent test results  Airway Mallampati: III  TM Distance: <3 FB Neck ROM: Full  Mouth opening: Limited Mouth Opening  Dental  (+) Dental Advisory Given, Teeth Intact   Pulmonary neg pulmonary ROS,    Pulmonary exam normal breath sounds clear to auscultation       Cardiovascular hypertension, Pt. on medications and Pt. on home beta blockers Normal cardiovascular exam Rhythm:Regular Rate:Normal  '18 Perfusion scan -  Probable normal perfusion and soft tissue attenuation This is a low risk study. Nuclear stress EF: 53%   EKG - NSR, lateral TWI   Neuro/Psych  Headaches, Depression Tremor LUE    GI/Hepatic negative GI ROS, Neg liver ROS,   Endo/Other  Pituitary microadenoma, obesity   Renal/GU negative Renal ROS  negative genitourinary   Musculoskeletal  (+) Arthritis ,   Abdominal   Peds  Hematology negative hematology ROS (+)   Anesthesia Other Findings   Reproductive/Obstetrics                           Anesthesia Physical Anesthesia Plan  ASA: II  Anesthesia Plan: General   Post-op Pain Management:    Induction: Intravenous  PONV Risk Score and Plan: 4 or greater and Treatment may vary due to age or medical condition, Ondansetron, Dexamethasone, Midazolam and Scopolamine patch - Pre-op  Airway Management Planned: Oral ETT  Additional Equipment: None  Intra-op Plan:   Post-operative Plan: Extubation in OR  Informed Consent: I have reviewed the patients History and Physical, chart, labs and discussed the procedure including the risks, benefits and alternatives for the proposed anesthesia with the patient or authorized representative who has indicated his/her understanding and acceptance.   Dental advisory  given  Plan Discussed with: CRNA and Anesthesiologist  Anesthesia Plan Comments:         Anesthesia Quick Evaluation

## 2017-12-19 NOTE — Progress Notes (Signed)
Patient transferred to 4N 02 via bed accompanied by staff and spouse. Patient in no signs of distress at the time of transfer. Pt's belongings transferred w patient. Report given to [facility].

## 2017-12-20 LAB — BASIC METABOLIC PANEL
ANION GAP: 12 (ref 5–15)
BUN: 17 mg/dL (ref 6–20)
CHLORIDE: 97 mmol/L — AB (ref 101–111)
CO2: 25 mmol/L (ref 22–32)
CREATININE: 0.91 mg/dL (ref 0.44–1.00)
Calcium: 8.6 mg/dL — ABNORMAL LOW (ref 8.9–10.3)
GFR calc non Af Amer: >60 mL/min (ref >60)
GLUCOSE: 149 mg/dL — AB (ref 65–99)
Potassium: 4.6 mmol/L (ref 3.5–5.1)
Sodium: 134 mmol/L — ABNORMAL LOW (ref 135–145)

## 2017-12-20 LAB — CBC
HCT: 39.7 % (ref 36.0–46.0)
HEMOGLOBIN: 13 g/dL (ref 12.0–15.0)
MCH: 30.5 pg (ref 26.0–34.0)
MCHC: 32.7 g/dL (ref 30.0–36.0)
MCV: 93.2 fL (ref 78.0–100.0)
Platelets: 243 10*3/uL (ref 150–400)
RBC: 4.26 MIL/uL (ref 3.87–5.11)
RDW: 13.1 % (ref 11.5–15.5)
WBC: 12.1 10*3/uL — ABNORMAL HIGH (ref 4.0–10.5)

## 2017-12-20 MED ORDER — PANTOPRAZOLE SODIUM 40 MG PO TBEC
40.0000 mg | DELAYED_RELEASE_TABLET | Freq: Every day | ORAL | Status: DC
  Administered 2017-12-20: 40 mg via ORAL
  Filled 2017-12-20: qty 1

## 2017-12-20 NOTE — Evaluation (Signed)
Physical Therapy Evaluation Patient Details Name: Kristin Gilmore MRN: [id number] DOB: 1950/10/03 Today's Date: 12/20/2017   History of Present Illness  67 y.o. female admitted s/p L2-4 laminectomy. PMH: HTN, dyslipidemia, arthritis, depression, tremors, pituatry microadenoma, and migranes.   Clinical Impression  Pt presents with overall decrease in functional mobility secondary to above including impairments listed below (see PT Problem List). Pt min assist to min guard for bed mobility and transfers and supervision for ambulation. Educated pt on back precautions and provided handout. Pt to benefit from continued acute PT to maximize safety, functional mobility, and independence prior to d/c home.     Follow Up Recommendations No PT follow up;Supervision for mobility/OOB    Equipment Recommendations  None recommended by PT    Recommendations for Other Services       Precautions / Restrictions Precautions Precautions: Back;Fall Precaution Booklet Issued: Yes (comment) Precaution Comments: no brace needed Restrictions Weight Bearing Restrictions: No      Mobility  Bed Mobility Overal bed mobility: Needs Assistance Bed Mobility: Rolling;Sidelying to Sit Rolling: Min assist Sidelying to sit: Min guard       General bed mobility comments: pt screaming that she needs to get out of bed. pt attempts rolling but says she just wants to sit straight up. attempted reinforcing back precautions and explain reason for them. assisted pt to roll with bed pad. heavy use of bed rail to elevate trunk. continuous VCs for safety, sequence, and technique  Transfers Overall transfer level: Needs assistance Equipment used: Rolling walker (2 wheeled) Transfers: Sit to/from Stand Sit to Stand: Min guard         General transfer comment: min guard for safety. VCs for hand placement  Ambulation/Gait Ambulation/Gait assistance: Supervision Ambulation Distance (Feet): 180 Feet Assistive  device: Rolling walker (2 wheeled) Gait Pattern/deviations: Step-through pattern;Decreased stride length Gait velocity: decreased Gait velocity interpretation: Below normal speed for age/gender General Gait Details: VCs for cadence,upright posture, and to maintain precautions. supervision for safety  Stairs            Wheelchair Mobility    Modified Rankin (Stroke Patients Only)       Balance Overall balance assessment: Mild deficits observed, not formally tested                                           Pertinent Vitals/Pain Pain Assessment: Faces Faces Pain Scale: Hurts little more Pain Location: incision Pain Descriptors / Indicators: Aching;Sore Pain Intervention(s): Limited activity within patient's tolerance;Monitored during session;Repositioned    Home Living Family/patient expects to be discharged to:: Private residence Living Arrangements: Spouse/significant other Available Help at Discharge: Family;Friend(s) Type of Home: House Home Access: Stairs to enter Entrance Stairs-Rails: Psychiatric nurse of Steps: 2 Home Layout: One level Home Equipment: Cane - single point Additional Comments: per pt, her husband has PD but is fully independent. pt also states she has a friend that can come to help her if she needs it    Prior Function Level of Independence: Independent         Comments: occasionally uses cane in community     Hand Dominance        Extremity/Trunk Assessment   Upper Extremity Assessment Upper Extremity Assessment: Defer to OT evaluation    Lower Extremity Assessment Lower Extremity Assessment: Generalized weakness       Communication   Communication: No  difficulties  Cognition Arousal/Alertness: Awake/alert Behavior During Therapy: Impulsive Overall Cognitive Status: Impaired/Different from baseline Area of Impairment: Safety/judgement                         Safety/Judgement:  Decreased awareness of safety;Decreased awareness of deficits     General Comments: pt extremely impulsive and irritable. upon PT arrival to room she is screaming that she is going to get out of bed because she is in so much pain.      General Comments General comments (skin integrity, edema, etc.): pt extremely irritable throughout session. required max cues to listen and follow directions    Exercises     Assessment/Plan    PT Assessment Patient needs continued PT services  PT Problem List Decreased strength;Decreased balance;Decreased knowledge of use of DME;Decreased safety awareness;Decreased knowledge of precautions;Pain       PT Treatment Interventions DME instruction;Gait training;Stair training;Functional mobility training;Therapeutic activities;Therapeutic exercise;Balance training;Neuromuscular re-education;Patient/family education    PT Goals (Current goals can be found in the Care Plan section)  Acute Rehab PT Goals Patient Stated Goal: to go home PT Goal Formulation: With patient Time For Goal Achievement: 01/03/18 Potential to Achieve Goals: Good    Frequency Min 5X/week   Barriers to discharge        Co-evaluation               AM-PAC PT "6 Clicks" Daily Activity  Outcome Measure Difficulty turning over in bed (including adjusting bedclothes, sheets and blankets)?: Unable Difficulty moving from lying on back to sitting on the side of the bed? : Unable Difficulty sitting down on and standing up from a chair with arms (e.g., wheelchair, bedside commode, etc,.)?: A Little Help needed moving to and from a bed to chair (including a wheelchair)?: A Little Help needed walking in hospital room?: A Little Help needed climbing 3-5 steps with a railing? : A Little 6 Click Score: 14    End of Session Equipment Utilized During Treatment: Gait belt Activity Tolerance: Patient tolerated treatment well Patient left: in chair;with call bell/phone within  reach;with chair alarm set Nurse Communication: Mobility status;Precautions PT Visit Diagnosis: Other abnormalities of gait and mobility (R26.89)    Time: 0350-0938 PT Time Calculation (min) (ACUTE ONLY): 16 min   Charges:   PT Evaluation $PT Eval Moderate Complexity: 1 Mod     PT G Codes:       Vic Ripper, SPT  Vic Ripper 12/20/2017, 10:27 AM

## 2017-12-20 NOTE — Progress Notes (Signed)
Subjective: Patient reports doing OK.  Objective: Vital signs in last 24 hours: Temp:  [97.6 F (36.4 C)-98.3 F (36.8 C)] 97.6 F (36.4 C) (03/23 0246) Pulse Rate:  [66-87] 78 (03/23 0400) Resp:  [12-18] 18 (03/23 0246) BP: (100-213)/(58-90) 169/78 (03/23 0400) SpO2:  [89 %-99 %] 95 % (03/23 0400) Weight:  [97.3 kg (214 lb 8.1 oz)] 97.3 kg (214 lb 8.1 oz) (03/22 2231)  Intake/Output from previous day: 03/22 0701 - 03/23 0700 In: 1240 [P.O.:240; I.V.:1000] Out: 410 [Urine:210; Blood:200] Intake/Output this shift: No intake/output data recorded.  Physical Exam: Strength full both legs.  Dressing CDI.  Lab Results: Recent Labs    12/20/17 0403  WBC 12.1*  HGB 13.0  HCT 39.7  PLT 243   BMET Recent Labs    12/20/17 0403  NA 134*  K 4.6  CL 97*  CO2 25  GLUCOSE 149*  BUN 17  CREATININE 0.91  CALCIUM 8.6*    Studies/Results: Dg Lumbar Spine 1 View  Result Date: 12/19/2017 CLINICAL DATA:  Laminectomy EXAM: LUMBAR SPINE - 1 VIEW COMPARISON:  Lumbar MRI October 04, 1115 FINDINGS: Metallic probe tip is posterior to the superior aspect of the L4 vertebral body. Cutting tool overlies the L3 spinous process. There is marked disc space narrowing at L3-4, L4-5, and L5-S1 with vacuum phenomenon at these levels. No fracture or spondylolisthesis. IMPRESSION: Metallic probe tip is posterior to the superior aspect of the L4 vertebral body with cutting tool overlying the L3 spinous process. Multilevel osteoarthritic change. No fracture or spondylolisthesis. Electronically Signed   By: Lowella Grip III M.D.   On: 12/19/2017 13:43    Assessment/Plan: POD 1.  Progressing.  Mobilize with PT.    LOS: 1 day    Peggyann Shoals, MD 12/20/2017, 7:12 AM

## 2017-12-21 MED ORDER — HYDROCODONE-ACETAMINOPHEN 5-325 MG PO TABS: 1.0000 | ORAL_TABLET | Freq: Four times a day (QID) | ORAL | 0 refills | Status: DC | PRN

## 2017-12-21 MED ORDER — METHOCARBAMOL 500 MG PO TABS: 500.0000 mg | ORAL_TABLET | Freq: Four times a day (QID) | ORAL | 1 refills | Status: DC | PRN

## 2017-12-21 MED ORDER — POLYETHYLENE GLYCOL 3350 17 G PO PACK
17.0000 g | PACK | Freq: Every day | ORAL | Status: DC
  Filled 2017-12-21: qty 1

## 2017-12-21 NOTE — Progress Notes (Signed)
1000- patient reports morphine is causing her to feel very dizzy and unsteady 1100- ambulatory to [facility], patient requests RN gives no pain medication  1400- patient ambulatory states she has some pain getting back into bed, education reinforced when changing positions some discomfort is normal to call RN is pain is persistent/ worsening or does not quickly subside.  1700- patient screaming she is in pain has been asking for pain medication all day, states she has little recollection of prior conversations regarding pain management.  States she needs to have a BM prior to being d/c'd.  Yelling and throwing items, ice pack, and pillows in room.  Explained this behavior will not be tolerated and is not warranted. Pt apologetic but quickly becomes impulsive and yelling again.  Patients husband visibly upset after being yelled at multiple times.   1800- Dr. Vertell Limber paged, states to increase oral bowel stimulants and observe overnight anticipate d/c in the morning 12/22/17

## 2017-12-21 NOTE — Care Management Note (Signed)
Case Management Note  Patient Details  Name: Kristin Gilmore MRN: [id number] Date of Birth: 08-17-1951  Subjective/Objective:        Pt from home with husband.  Pt with multiple  Complaints and states husband may or may not be able to help her and needs a caregiver.  Pt states she may look into hiring someone or a neighbor may be available to help her.  Pt agreeable to [facility] PT/OT.       Action/Plan: Pt offered list of [place] agencies.  Pt states she has no preference.  Pt is agreeable to [facility].  Jermaine with [facility] called for referral.    Expected Discharge Date:  12/21/17               Expected Discharge Plan:  [place]  In-House Referral:  NA  Discharge planning Services  CM Consult  Post Acute Care Choice:  Home Health Choice offered to:  Patient  DME Arranged:  N/A DME Agency:  NA  HH Arranged:  PT, OT HH Agency:  [place]  Status of Service:  Completed, signed off  If discussed at [place] of Stay Meetings, dates discussed:    Additional Comments:  Claudie Leach, RN 12/21/2017, 4:35 PM

## 2017-12-21 NOTE — Progress Notes (Signed)
Physical Therapy Treatment Patient Details Name: Kristin Gilmore MRN: [id number] DOB: 03-12-1951 Today's Date: 12/21/2017    History of Present Illness 67 y.o. female admitted s/p L2-4 laminectomy. PMH: HTN, dyslipidemia, arthritis, depression, tremors, pituatry microadenoma, and migranes.     PT Comments    Pt sitting on EOB upon this clinicians arrival. Agreeable to participate in PT session.  States she feels like a Freight forwarder" today because she thinks she had to much morphine.  She required min assist to achieve standing from bed initially but able to stand from elevated toilet seat with min guard.  Ambulated ~120' with RW with min guard for safety.  Recommended patient ambulate with Nsing 2-3 more times today and to sit in the recliner for 45 mins - 1 hr periodically throughout the day.   Updated follow up recommendations to [place] [facility] as pt states she isn't moving as well today as she was yesterday and to ensure safety when she returns home once medically ready.        Follow Up Recommendations  Home health PT     Equipment Recommendations  None recommended by PT    Recommendations for Other Services       Precautions / Restrictions Precautions Precautions: Back;Fall Precaution Booklet Issued: No Precaution Comments: Pt able to recall 3/3 back precautions Restrictions Weight Bearing Restrictions: No    Mobility  Bed Mobility Overal bed mobility: Needs Assistance Bed Mobility: Sit to Sidelying         Sit to sidelying: Supervision General bed mobility comments: Pt sitting EOB upon this clinicians arrival   Transfers Overall transfer level: Needs assistance Equipment used: Rolling walker (2 wheeled) Transfers: Sit to/from Stand Sit to Stand: Min guard         General transfer comment: cues for hand placement.  Min assist to achieve standing from EOB but min guard from elevated toilet seat  Ambulation/Gait Ambulation/Gait assistance: Min  guard Ambulation Distance (Feet): 120 Feet Assistive device: Rolling walker (2 wheeled) Gait Pattern/deviations: Step-through pattern;Decreased stride length Gait velocity: decreased Gait velocity interpretation: Below normal speed for age/gender General Gait Details: cues to increase stride length, gt speed, and relax shoulders.  No unsteadiness noted.    Stairs            Wheelchair Mobility    Modified Rankin (Stroke Patients Only)       Balance Overall balance assessment: Needs assistance Sitting-balance support: Feet supported;No upper extremity supported Sitting balance-Leahy Scale: Good     Standing balance support: No upper extremity supported;During functional activity Standing balance-Leahy Scale: Fair Standing balance comment: static standing                            Cognition Arousal/Alertness: Awake/alert Behavior During Therapy: WFL for tasks assessed/performed Overall Cognitive Status: Within Functional Limits for tasks assessed                                        Exercises      General Comments        Pertinent Vitals/Pain Pain Assessment: 0-10 Pain Score: 7  Pain Location: back Pain Descriptors / Indicators: Aching;Constant;Grimacing;Guarding;Operative site guarding Pain Intervention(s): Limited activity within patient's tolerance;Monitored during session;Repositioned    Home Living Family/patient expects to be discharged to:: Private residence Living Arrangements: Spouse/significant other Available Help at Discharge: Family;Friend(s);Available 24 hours/day  Type of Home: House Home Access: Stairs to enter Entrance Stairs-Rails: Right;Left Home Layout: One level Home Equipment: Cane - single point;Bedside commode;Shower seat - built in;Grab bars - toilet;Grab bars - tub/shower      Prior Function Level of Independence: Independent      Comments: occasionally uses cane in community   PT Goals  (current goals can now be found in the care plan section) Acute Rehab PT Goals Patient Stated Goal: to go home PT Goal Formulation: With patient Time For Goal Achievement: 01/03/18 Potential to Achieve Goals: Good    Frequency    Min 5X/week      PT Plan Discharge plan needs to be updated    Co-evaluation              AM-PAC PT "6 Clicks" Daily Activity  Outcome Measure                   End of Session Equipment Utilized During Treatment: Gait belt Activity Tolerance: Patient limited by pain;Patient limited by lethargy Patient left: in chair;with call bell/phone within reach Nurse Communication: Mobility status       Time: 1194-1740 PT Time Calculation (min) (ACUTE ONLY): 34 min  Charges:  $Gait Training: 8-22 mins $Therapeutic Activity: 8-22 mins                    G CodesSarajane Marek, [place] [phone] 12/21/2017

## 2017-12-21 NOTE — Evaluation (Signed)
Occupational Therapy Evaluation Patient Details Name: Kristin Gilmore MRN: [id number] DOB: [birth year] Today's Date: 12/21/2017    History of Present Illness 67 y.o. female admitted s/p L2-4 laminectomy. PMH: HTN, dyslipidemia, arthritis, depression, tremors, pituatry microadenoma, and migranes.    Clinical Impression   Pt reports she was independent with ADL PTA. Currently pt supervision with ADL and functional mobility with the exception of min assist for LB ADL. All back, safety, and ADL education completed with pt but she would benefit from review of information. Pt planning to d/c home with 24/7 supervision from family. Pt would benefit from continued skilled OT to address established goals.     Follow Up Recommendations  No OT follow up    Equipment Recommendations  None recommended by OT    Recommendations for Other Services       Precautions / Restrictions Precautions Precautions: Back;Fall Precaution Booklet Issued: No Precaution Comments: Pt able to recall 3/3 back precautions Restrictions Weight Bearing Restrictions: No      Mobility Bed Mobility Overal bed mobility: Needs Assistance Bed Mobility: Sit to Sidelying         Sit to sidelying: Supervision General bed mobility comments: Pt sitting EOB upon this clinicians arrival   Transfers Overall transfer level: Needs assistance Equipment used: Rolling walker (2 wheeled) Transfers: Sit to/from Stand Sit to Stand: Min guard         General transfer comment: cues for hand placement.  Min assist to achieve standing from EOB but min guard from elevated toilet seat    Balance Overall balance assessment: Needs assistance Sitting-balance support: Feet supported;No upper extremity supported Sitting balance-Leahy Scale: Good     Standing balance support: No upper extremity supported;During functional activity Standing balance-Leahy Scale: Fair Standing balance comment: static standing                            ADL either performed or assessed with clinical judgement   ADL Overall ADL's : Needs assistance/impaired Eating/Feeding: Set up;Sitting   Grooming: Supervision/safety;Standing;Wash/dry hands Grooming Details (indicate cue type and reason): Educated on use of 2 cups for oral care Upper Body Bathing: Set up;Supervision/ safety;Sitting   Lower Body Bathing: Minimal assistance;Sit to/from stand   Upper Body Dressing : Set up;Supervision/safety;Sitting   Lower Body Dressing: Minimal assistance;Sit to/from stand Lower Body Dressing Details (indicate cue type and reason): Pt planning for husband to assist with ADL as needed Toilet Transfer: Supervision/safety;Ambulation;BSC;RW   Toileting- Clothing Manipulation and Hygiene: Supervision/safety;Sit to/from stand Toileting - Clothing Manipulation Details (indicate cue type and reason): Educated on proper technique for peri care without twisting     Functional mobility during ADLs: Supervision/safety;Rolling walker General ADL Comments: Educated pt on maintaining back precautions during functional activities, keeping items at counter top height, log roll for bed mobility     Vision         Perception     Praxis      Pertinent Vitals/Pain Pain Assessment: 0-10 Pain Score: 7  Pain Location: back Pain Descriptors / Indicators: Aching;Constant;Grimacing;Guarding;Operative site guarding Pain Intervention(s): Limited activity within patient's tolerance;Monitored during session;Repositioned     Hand Dominance     Extremity/Trunk Assessment Upper Extremity Assessment Upper Extremity Assessment: Overall WFL for tasks assessed   Lower Extremity Assessment Lower Extremity Assessment: Defer to PT evaluation   Cervical / Trunk Assessment Cervical / Trunk Assessment: Other exceptions Cervical / Trunk Exceptions: s/p spinal sx   Communication Communication Communication: No  difficulties   Cognition  Arousal/Alertness: Awake/alert Behavior During Therapy: WFL for tasks assessed/performed Overall Cognitive Status: Within Functional Limits for tasks assessed                                     General Comments       Exercises     Shoulder Instructions      Home Living Family/patient expects to be discharged to:: Private residence Living Arrangements: Spouse/significant other Available Help at Discharge: Family;Friend(s);Available 24 hours/day Type of Home: House Home Access: Stairs to enter [employer] of Steps: 2 Entrance Stairs-Rails: Right;Left Home Layout: One level     Bathroom Shower/Tub: Occupational psychologist: Standard     Home Equipment: Cane - single point;Bedside commode;Shower seat - built in;Grab bars - toilet;Grab bars - tub/shower          Prior Functioning/Environment Level of Independence: Independent        Comments: occasionally uses cane in community        OT Problem List: Decreased activity tolerance;Impaired balance (sitting and/or standing);Decreased knowledge of use of DME or AE;Decreased knowledge of precautions;Obesity;Pain      OT Treatment/Interventions: Self-care/ADL training;Energy conservation;DME and/or AE instruction;Therapeutic activities;Patient/family education;Balance training    OT Goals(Current goals can be found in the care plan section) Acute Rehab OT Goals Patient Stated Goal: to go home OT Goal Formulation: With patient Time For Goal Achievement: 01/04/18 Potential to Achieve Goals: Good ADL Goals Pt Will Perform Lower Body Bathing: with supervision;sit to/from stand Pt Will Perform Lower Body Dressing: with supervision;sit to/from stand Pt Will Perform Tub/Shower Transfer: Shower transfer;with supervision;ambulating;shower seat;rolling walker  OT Frequency: Min 2X/week   Barriers to D/C:            Co-evaluation              AM-PAC PT "6 Clicks" Daily Activity      Outcome Measure Help from another person eating meals?: None Help from another person taking care of personal grooming?: A Little Help from another person toileting, which includes using toliet, bedpan, or urinal?: A Little Help from another person bathing (including washing, rinsing, drying)?: A Little Help from another person to put on and taking off regular upper body clothing?: None Help from another person to put on and taking off regular lower body clothing?: A Little 6 Click Score: 20   End of Session Equipment Utilized During Treatment: Rolling walker Nurse Communication: Mobility status  Activity Tolerance: Patient tolerated treatment well Patient left: in bed;with call bell/phone within reach  OT Visit Diagnosis: Unsteadiness on feet (R26.81);Pain Pain - part of body: (back)                Time: 6286-3817 OT Time Calculation (min): 15 min Charges:  OT General Charges $OT Visit: 1 Visit OT Evaluation $OT Eval Moderate Complexity: 1 Mod G-Codes:     Hasani Diemer A. Ulice Brilliant, M.S., OTR/L Pager: [place] 12/21/2017, 12:14 PM

## 2017-12-21 NOTE — Discharge Summary (Addendum)
Physician Discharge Summary  Patient ID: Kristin Gilmore MRN: [id number] DOB/AGE: [birth year] 68 y.o.  Admit date: 12/19/2017 Discharge date: 12/21/2017  Admission Diagnoses:lumbar stenosis L2-L5 L2-L5 laminectomy   Discharge Diagnoses: same Active Problems:   Lumbar spinal stenosis   Discharged Condition: good  Hospital Course: Kristin Gilmore was admitted and taken to the operating room for an uncomplicated lumbar decompression from L2-L5. Post op Kristin Gilmore has ambulated, voided, and tolerated a regular diet. Her wound is clean dry, and without signs of infection.  Treatments: surgery:L2-L5 laminectomy    Discharge Exam: Blood pressure (!) 150/107, pulse 80, temperature 98.6 F (37 C), temperature source Oral, resp. rate 19, height 5' 7.5" (1.715 m), weight 97.3 kg (214 lb 8.1 oz), SpO2 96 %. General appearance: alert, cooperative and appears stated age Neurologic: Alert and oriented X 3, normal strength and tone. Normal symmetric reflexes. Normal coordination and gait  Disposition: Discharge disposition: 01-Home or Self Care      LUMBAR STENOSIS WITHOUT NEUROGENIC CLAUDICATION  Allergies as of 12/21/2017      Reactions   Biaxin [clarithromycin] Hives   Amoxicillin    UNSPECIFIED REACTION  Has patient had a PCN reaction causing immediate rash, facial/tongue/throat swelling, SOB or lightheadedness with hypotension: Unknown Has patient had a PCN reaction causing severe rash involving mucus membranes or skin necrosis: Unknown Has patient had a PCN reaction that required hospitalization: Unknown Has patient had a PCN reaction occurring within the last 10 years: No If all of the above answers are "NO", then may proceed with Cephalosporin use.      Medication List    TAKE these medications   acetaminophen 500 MG tablet Commonly known as:  TYLENOL Take 1,000 mg by mouth 2 (two) times daily.   CAL-CITRATE PO Take 1-2 tablets by mouth 2 (two) times daily. TAKE  1 TABLET IN THE MORNING & TAKE 2 TABLETS WITH SUPPER   COQ10 PO Take 100 mg by mouth daily with supper. H-Eq10 (CoQ10 with Hemp)   CoQ10 100 MG Caps Take 100 mg by mouth daily with supper.   COSAMIN DS PO Take 3 capsules by mouth daily. 1500 glucosamine   fluticasone 50 MCG/ACT nasal spray Commonly known as:  FLONASE Place 1 spray into both nostrils daily.   gabapentin 300 MG capsule Commonly known as:  NEURONTIN Take 600 mg by mouth 3 (three) times daily.   HYDROcodone-acetaminophen 5-325 MG tablet Commonly known as:  NORCO/VICODIN Take 1-2 tablets by mouth every 6 (six) hours as needed for moderate pain ((score 4 to 6)).   loperamide 2 MG capsule Commonly known as:  IMODIUM Take 2-4 mg by mouth 2 (two) times daily as needed for diarrhea or loose stools.   MAGNESIUM SULFATE PO Take 1 tablet by mouth at bedtime.   methocarbamol 500 MG tablet Commonly known as:  ROBAXIN Take 1 tablet (500 mg total) by mouth every 6 (six) hours as needed for muscle spasms.   metoprolol tartrate 50 MG tablet Commonly known as:  LOPRESSOR Take 75 mg by mouth 2 (two) times daily. With supper & with breakfast   naproxen sodium 220 MG tablet Commonly known as:  ALEVE Take 220 mg by mouth 2 (two) times daily as needed (for pain.).   Omega 3 1000 MG Caps Take 1,000 mg by mouth 2 (two) times daily. SUPER OMEGA-3   OVER THE COUNTER MEDICATION Take 2 capsules by mouth daily with supper. Quick 6 Weight Loss Supplement   PROBIOTIC PO Take 1 capsule by  mouth daily. SUPREMA DOPHILUS 5 BILLION CFU   propranolol 10 MG tablet Commonly known as:  INDERAL Take 10 mg by mouth daily as needed (for hand tremors).   QUEtiapine 300 MG tablet Commonly known as:  SEROQUEL Take 150 mg by mouth at bedtime.   Red Yeast Rice 600 MG Caps Take 1,200 mg by mouth 2 (two) times daily.   ROYAL JELLY PO Take 150 mg by mouth daily.   TURMERIC PO Take 475 mg by mouth 2 (two) times daily. WITH FOOD    Vitamin D3 2000 units Tabs Take 2,000 Units by mouth daily with supper.   vitamin E 400 UNIT capsule Take 400 Units by mouth daily with supper.      Follow-up Information    Consuella Lose, MD. Call in 2 week(s).   Specialty:  Neurosurgery Why:  please call the office to make an appointment Contact information: [address] [place] [address] [phone]           Signed: Winfield Cunas 12/21/2017, 10:49 AM

## 2017-12-21 NOTE — Progress Notes (Signed)
Patient ID: Kristin Gilmore, female   DOB: 10-10-1950, 67 y.o.   MRN: [id number] BP (!) 150/107   Pulse 80   Temp 98.6 F (37 C) (Oral)   Resp 19   Ht 5' 7.5" (1.715 m)   Wt 97.3 kg (214 lb 8.1 oz)   SpO2 96%   BMI 33.10 kg/m  Alert and oriented x 4 Speech is clear and fluent Moving lower extremities well, wound is clean and dry Will reassess about discharge later today

## 2017-12-22 ENCOUNTER — Encounter (HOSPITAL_COMMUNITY): Payer: Self-pay | Admitting: Neurosurgery

## 2017-12-22 MED ORDER — POLYETHYLENE GLYCOL 3350 17 G PO PACK: 17.0000 g | PACK | Freq: Every day | ORAL | 0 refills | Status: DC

## 2017-12-22 MED ORDER — METHOCARBAMOL 500 MG PO TABS: 500.0000 mg | ORAL_TABLET | Freq: Four times a day (QID) | ORAL | 1 refills | Status: DC | PRN

## 2017-12-22 MED ORDER — OXYCODONE HCL 10 MG PO TABS: 10.0000 mg | ORAL_TABLET | Freq: Four times a day (QID) | ORAL | 0 refills | Status: DC | PRN

## 2017-12-22 MED FILL — Thrombin For Soln 5000 Unit: CUTANEOUS | Qty: 5000 | Status: AC

## 2017-12-22 MED FILL — Gelatin Absorbable MT Powder: OROMUCOSAL | Qty: 1 | Status: AC

## 2017-12-22 NOTE — Progress Notes (Signed)
Patient also refused 0400 vital signs.

## 2017-12-22 NOTE — Progress Notes (Signed)
Events of last pm noted. Pt sleeping but pleasant this am. Does report back pain, but notes improvement in leg numbness.  EXAM:  BP 115/88 (BP Location: Right Arm)   Pulse 81   Temp 98.3 F (36.8 C) (Oral)   Resp 18   Ht 5' 7.5" (1.715 m)   Wt 97.3 kg (214 lb 8.1 oz)   SpO2 96%   BMI 33.10 kg/m   Awake, alert, oriented  Speech fluent, appropriate  CN grossly intact  5/5 BUE/BLE  Wound c/d/i  IMPRESSION:  67 y.o. female POD#3 s/p L2-5 laminectomy, recovering slowly.  PLAN: - D/C home today

## 2017-12-22 NOTE — Care Management Note (Signed)
Case Management Note  Patient Details  Name: Kristin Gilmore MRN: [id number] Date of Birth: 09/11/1951  Subjective/Objective:        Pt from home with husband.  Pt with multiple  Complaints and states husband may or may not be able to help her and needs a caregiver.  Pt states she may look into hiring someone or a neighbor may be available to help her.  Pt agreeable to [facility] PT/OT.       Action/Plan: Pt offered list of [place] agencies.  Pt states she has no preference.  Pt is agreeable to [facility].  Jermaine with [facility] called for referral.    Expected Discharge Date:  12/22/17               Expected Discharge Plan:  [place]  In-House Referral:  NA  Discharge planning Services  CM Consult  Post Acute Care Choice:  Home Health Choice offered to:  Patient  DME Arranged:  N/A DME Agency:  NA  HH Arranged:  PT, OT HH Agency:  [place]  Status of Service:  Completed, signed off  If discussed at [place] of Stay Meetings, dates discussed:    Additional Comments:  12/22/17 J. Abeer Iversen, RN, BSN Pt discharging home today with spouse.  Notified [facility] of dc date.    Ella Bodo, RN 12/22/2017, 1:54 PM

## 2017-12-22 NOTE — Anesthesia Postprocedure Evaluation (Signed)
Anesthesia Post Note  Patient: Kristin Gilmore  Procedure(s) Performed: LAMINECTOMY LUMBAR TWO- LUMBAR THREE, LUMBAR THREE- LUMBAR FOUR, LUMBAR FOUR- LUMBAR FIVE  (N/A Back)     Patient location during evaluation: PACU Anesthesia Type: General Level of consciousness: awake and alert Pain management: pain level controlled Vital Signs Assessment: post-procedure vital signs reviewed and stable Respiratory status: spontaneous breathing, nonlabored ventilation, respiratory function stable and patient connected to nasal cannula oxygen Cardiovascular status: blood pressure returned to baseline and stable Postop Assessment: no apparent nausea or vomiting Anesthetic complications: no    Last Vitals:  Vitals:   12/21/17 1900 12/22/17 0900  BP: (!) 158/77 115/88  Pulse: 64 81  Resp:  18  SpO2: 95% 96%    Last Pain:  Vitals:   12/22/17 0932  TempSrc:   PainSc: 4                  Tiajuana Amass

## 2017-12-22 NOTE — Progress Notes (Signed)
Pt discharged home with husband. AVS reviewed in full with patient and husband. All questions answered. Pt has walker at home. Home Health arranged by case management. Prescriptions electronically sent to [facility] [address] by Dr. Kathyrn Sheriff per patient request. All belongings sent with patient. VSS. BP 115/88 (BP Location: Right Arm)   Pulse 81   Temp 98.9 F (37.2 C) (Oral)   Resp 18   Ht 5' 7.5" (1.715 m)   Wt 97.3 kg (214 lb 8.1 oz)   SpO2 96%   BMI 33.10 kg/m

## 2017-12-22 NOTE — Progress Notes (Signed)
Patient's only complaints were of pain and stiffness throughout the night. Other than that, patient slept fairly well. Report given to Ginger.

## 2017-12-22 NOTE — Progress Notes (Signed)
Pt refused 0800 vital signs, pt is still sleeping. Will attempt once patient up awake and eating breakfast.

## 2017-12-22 NOTE — Progress Notes (Signed)
Physical Therapy Treatment Patient Details Name: Kristin Gilmore MRN: [id number] DOB: 1951/05/10 Today's Date: 12/22/2017    History of Present Illness 67 y.o. female admitted s/p L2-4 laminectomy. PMH: HTN, dyslipidemia, arthritis, depression, tremors, pituatry microadenoma, and migranes.     PT Comments    Pt pleasant and willing to mobilize but requesting and requiring additional time to mobilize and transition between positions. Pt able to state all precautions with increased time with education for sitting, encouragement to be OOB for all meals and progressive gait. Will follow.     Follow Up Recommendations  Home health PT     Equipment Recommendations  None recommended by PT    Recommendations for Other Services       Precautions / Restrictions Precautions Precautions: Back;Fall Precaution Comments: Pt able to recall 3/3 back precautions Restrictions Weight Bearing Restrictions: No    Mobility  Bed Mobility   Bed Mobility: Sidelying to Sit           General bed mobility comments: pt sidelying on arrival with use of rail able to come to EOB and elevate trunk  Transfers Overall transfer level: Needs assistance   Transfers: Sit to/from Stand Sit to Stand: Min guard         General transfer comment: pt able to state and use correct hand placement, cues for spine position with transfer  Ambulation/Gait Ambulation/Gait assistance: Supervision Ambulation Distance (Feet): 300 Feet Assistive device: Rolling walker (2 wheeled) Gait Pattern/deviations: Step-through pattern;Decreased stride length   Gait velocity interpretation: Below normal speed for age/gender General Gait Details: cues for posture   Stairs            Wheelchair Mobility    Modified Rankin (Stroke Patients Only)       Balance                                            Cognition Arousal/Alertness: Awake/alert Behavior During Therapy: WFL for tasks  assessed/performed Overall Cognitive Status: Within Functional Limits for tasks assessed                                        Exercises General Exercises - Lower Extremity Long Arc Quad: AROM;15 reps;Seated;Both Hip Flexion/Marching: AROM;15 reps;Seated;Both    General Comments        Pertinent Vitals/Pain Pain Assessment: No/denies pain    Home Living                      Prior Function            PT Goals (current goals can now be found in the care plan section) Progress towards PT goals: Progressing toward goals    Frequency           PT Plan Current plan remains appropriate    Co-evaluation              AM-PAC PT "6 Clicks" Daily Activity  Outcome Measure  Difficulty turning over in bed (including adjusting bedclothes, sheets and blankets)?: A Little Difficulty moving from lying on back to sitting on the side of the bed? : A Little Difficulty sitting down on and standing up from a chair with arms (e.g., wheelchair, bedside commode, etc,.)?: A Little Help needed moving to and from  a bed to chair (including a wheelchair)?: A Little Help needed walking in hospital room?: A Little Help needed climbing 3-5 steps with a railing? : A Little 6 Click Score: 18    End of Session Equipment Utilized During Treatment: Gait belt Activity Tolerance: Patient tolerated treatment well Patient left: in chair;with call bell/phone within reach;with chair alarm set Nurse Communication: Mobility status       Time: 7654-6503 PT Time Calculation (min) (ACUTE ONLY): 32 min  Charges:  $Gait Training: 8-22 mins $Therapeutic Activity: 8-22 mins                    G Codes:       Elwyn Reach, [place]    [place] 12/22/2017, 10:58 AM

## 2017-12-22 NOTE — Plan of Care (Signed)
  Problem: Safety: Goal: Ability to remain free from injury will improve Outcome: Progressing   Problem: Activity: Goal: Ability to avoid complications of mobility impairment will improve Outcome: Progressing Goal: Ability to tolerate increased activity will improve Outcome: Progressing Goal: Will remain free from falls Outcome: Progressing   Problem: Bowel/Gastric: Goal: Gastrointestinal status for postoperative course will improve Outcome: Progressing   Problem: Education: Goal: Ability to verbalize activity precautions or restrictions will improve Outcome: Progressing Goal: Knowledge of the prescribed therapeutic regimen will improve Outcome: Progressing Goal: Understanding of discharge needs will improve Outcome: Progressing   Problem: Physical Regulation: Goal: Ability to maintain clinical measurements within normal limits will improve Outcome: Progressing Goal: Postoperative complications will be avoided or minimized Outcome: Progressing Goal: Diagnostic test results will improve Outcome: Progressing   Problem: Pain Management: Goal: Pain level will decrease Outcome: Progressing   Problem: Skin Integrity: Goal: Signs of wound healing will improve Outcome: Progressing   Problem: Health Behavior/Discharge Planning: Goal: Identification of resources available to assist in meeting health care needs will improve Outcome: Progressing

## 2017-12-22 NOTE — Progress Notes (Signed)
Patient refused midnight vital signs.

## 2018-04-22 ENCOUNTER — Telehealth: Payer: Self-pay | Admitting: Adult Health

## 2018-04-22 NOTE — Telephone Encounter (Signed)
Pt has called stating that she has headaches for the last couple of weeks that are worsening.  Pt states they are in the back of her head and neck, pt states the pain then goes into her head.  Pt has agreed to 1st available appointment with Dr Jannifer Franklin and is on wait list(Pt declined waiting until 09-2018 to see NP [facility]). Pt is asking for a call from RN on anything that might be suggested to help in the pain she is having from her headaches.

## 2018-04-23 NOTE — Telephone Encounter (Signed)
Attempted to reach patient, phone continuously rang.

## 2018-04-23 NOTE — Telephone Encounter (Signed)
Has she tried OTC medications? When I saw her she was not having any headaches that was 1 year ago. If she isn't taking OTC medications. She could try this first to see if headache resolves. She is having light or noise sensitivity nausea or vomiting?  Numbness or tingling in the arms or legs?  Any visual changes?

## 2018-04-28 ENCOUNTER — Encounter: Payer: Self-pay | Admitting: Neurology

## 2018-04-28 ENCOUNTER — Encounter

## 2018-04-28 ENCOUNTER — Other Ambulatory Visit: Payer: Self-pay

## 2018-04-28 ENCOUNTER — Telehealth: Payer: Self-pay | Admitting: *Deleted

## 2018-04-28 ENCOUNTER — Ambulatory Visit (INDEPENDENT_AMBULATORY_CARE_PROVIDER_SITE_OTHER): Payer: Medicare Other | Admitting: Neurology

## 2018-04-28 VITALS — BP 191/90 | HR 62 | Ht 67.5 in | Wt 197.5 lb

## 2018-04-28 DIAGNOSIS — R251 Tremor, unspecified: Secondary | ICD-10-CM

## 2018-04-28 DIAGNOSIS — G43001 Migraine without aura, not intractable, with status migrainosus: Secondary | ICD-10-CM

## 2018-04-28 MED ORDER — AMITRIPTYLINE HCL 10 MG PO TABS: ORAL_TABLET | ORAL | 3 refills | Status: DC

## 2018-04-28 NOTE — Patient Instructions (Signed)
We will start amitriptyline 10 mg at night, gradually increasing the dose.  Elavil (amitriptyline) is an antidepressant medication that has many uses that may include headache, whiplash injuries, or for peripheral neuropathy pain. Side effects may include drowsiness, dry mouth, blurred vision, or constipation. As with any antidepressant medication, worsening depression may occur. If you had any significant side effects, please call our office. The full effects of this medication may take 7-10 days after starting the drug, or going up on the dose.

## 2018-04-28 NOTE — Progress Notes (Signed)
Reason for visit: Headache, tremor  Kristin Gilmore is an 67 y.o. female  History of present illness:  Kristin Gilmore is a 67 year old right-handed white female with a history of headache in the past.  The patient has done quite well with her headache until just recently.  The patient began having increased headaches over the last month or so.  She has been under some stress as her husband has Parkinson's disease and he will be going for a deep brain stimulator.  In the past, the patient has been on Ativan for the tremor without much benefit, she takes propranolol if needed as well without benefit.  The patient has a true resting tremor of the left arm and also has noted a tremor off and on with the left leg and foot.  She has not had any significant issues with mobility or gait instability.  The patient denies any changes in speech or swallowing.  The patient recently had lumbosacral spine surgery at the L3, 4, and 5 level by Dr. Kathyrn Sheriff, she has done well with her low back pain.  She returns for an evaluation.  Past Medical History:  Diagnosis Date  . Arthritis   . Depression   . Dyslipidemia   . Hypertension   . Migraine   . Migraine without aura, without mention of intractable migraine without mention of status migrainosus 04/13/2014  . Pituitary microadenoma ([place]) 04/13/2014  . Pre-diabetes    "i was told i was pre-diabetic a while ago"  . Tremor 04/13/2014    Past Surgical History:  Procedure Laterality Date  . CHOLECYSTECTOMY    . COLONOSCOPY W/ POLYPECTOMY    . LUMBAR LAMINECTOMY/DECOMPRESSION MICRODISCECTOMY N/A 12/19/2017   Procedure: LAMINECTOMY LUMBAR TWO- LUMBAR THREE, LUMBAR THREE- LUMBAR FOUR, LUMBAR FOUR- LUMBAR FIVE ;  Surgeon: Consuella Lose, MD;  Location: [place];  Service: Neurosurgery;  Laterality: N/A;  . NASAL SINUS SURGERY    . TONSILLECTOMY    . WISDOM TOOTH EXTRACTION      Family History  Problem Relation Age of Onset  . Cancer Father    stomach cancer  . Migraines Mother     Social history:  reports that she has never smoked. She has never used smokeless tobacco. She reports that she drinks alcohol. She reports that she does not use drugs.    Allergies  Allergen Reactions  . Biaxin [Clarithromycin] Hives  . Amoxicillin     UNSPECIFIED REACTION  Has patient had a PCN reaction causing immediate rash, facial/tongue/throat swelling, SOB or lightheadedness with hypotension: Unknown Has patient had a PCN reaction causing severe rash involving mucus membranes or skin necrosis: Unknown Has patient had a PCN reaction that required hospitalization: Unknown Has patient had a PCN reaction occurring within the last 10 years: No If all of the above answers are "NO", then may proceed with Cephalosporin use.     Medications:  Prior to Admission medications   Medication Sig Start Date End Date Taking? Authorizing Provider  acetaminophen (TYLENOL) 500 MG tablet Take 1,000 mg by mouth 2 (two) times daily.    Yes [provider]  Calcium Citrate (CAL-CITRATE PO) Take 1-2 tablets by mouth 2 (two) times daily. TAKE 1 TABLET IN THE MORNING & TAKE 2 TABLETS WITH SUPPER   Yes [provider]  Cholecalciferol (VITAMIN D3) 2000 UNITS TABS Take 2,000 Units by mouth daily with supper.    Yes [provider]  Coenzyme Q10 (COQ10 PO) Take 100 mg by mouth daily  with supper. H-Eq10 (CoQ10 with Hemp)   Yes [provider]  Coenzyme Q10 (COQ10) 100 MG CAPS Take 100 mg by mouth daily with supper.   Yes [provider]  dicyclomine (BENTYL) 20 MG tablet Take 20 mg by mouth 2 (two) times daily as needed for spasms.   Yes [provider]  diphenhydrAMINE (BENADRYL) 25 MG tablet Take 25 mg by mouth every 6 (six) hours as needed.   Yes [provider]  fluticasone (FLONASE) 50 MCG/ACT nasal spray Place 1 spray into both nostrils daily.  01/18/17  Yes [provider]    Glucosamine-Chondroitin (COSAMIN DS PO) Take 3 capsules by mouth daily. 1500 glucosamine   Yes [provider]  HYDROcodone-acetaminophen (NORCO/VICODIN) 5-325 MG tablet Take 1 tablet by mouth every 4 (four) hours as needed for moderate pain.   Yes [provider]  loperamide (IMODIUM) 2 MG capsule Take 2-4 mg by mouth 2 (two) times daily as needed for diarrhea or loose stools.   Yes [provider]  loratadine (CLARITIN) 10 MG tablet Take 10 mg by mouth daily.   Yes [provider]  MAGNESIUM SULFATE PO Take 1 tablet by mouth at bedtime.    Yes [provider]  methocarbamol (ROBAXIN) 500 MG tablet Take 1 tablet (500 mg total) by mouth every 6 (six) hours as needed for muscle spasms. 12/22/17  Yes Consuella Lose, MD  metoprolol (LOPRESSOR) 50 MG tablet Take 75 mg by mouth 2 (two) times daily. With supper & with breakfast   Yes [provider]  naproxen sodium (ALEVE) 220 MG tablet Take 220 mg by mouth 2 (two) times daily as needed (for pain.).   Yes [provider]  Omega 3 1000 MG CAPS Take 1,000 mg by mouth 2 (two) times daily. SUPER OMEGA-3   Yes [provider]  OVER THE COUNTER MEDICATION Take 2 capsules by mouth daily with supper. Quick 6 Weight Loss Supplement   Yes [provider]  Oxycodone HCl 10 MG TABS Take 1 tablet (10 mg total) by mouth every 6 (six) hours as needed ((score 7 to 10)). 12/22/17  Yes Consuella Lose, MD  polyethylene glycol (MIRALAX / GLYCOLAX) packet Take 17 g by mouth daily. 12/23/17  Yes Consuella Lose, MD  Probiotic Product (PROBIOTIC PO) Take 1 capsule by mouth daily. SUPREMA DOPHILUS 5 BILLION CFU   Yes [provider]  propranolol (INDERAL) 10 MG tablet Take 10 mg by mouth daily as needed (for hand tremors).   Yes [provider]  QUEtiapine (SEROQUEL) 300 MG tablet Take 150 mg by mouth at bedtime.    Yes [provider]  Red Yeast Rice 600 MG CAPS  Take 1,200 mg by mouth 2 (two) times daily.   Yes [provider]  ROYAL JELLY PO Take 150 mg by mouth daily.   Yes [provider]  TURMERIC PO Take 475 mg by mouth 2 (two) times daily. WITH FOOD   Yes [provider]  vitamin E 400 UNIT capsule Take 400 Units by mouth daily with supper.    Yes [provider]  amitriptyline (ELAVIL) 10 MG tablet Take one tablet at night for one week, then take 2 tablets at night for one week, then take 3 tablets at night. 04/28/18   Kathrynn Ducking, MD    ROS:  Out of a complete 14 system review of symptoms, the patient complains only of the following symptoms, and all other reviewed systems are negative.  Eye itching Abdominal pain, diarrhea Headache, tremors  Blood pressure (!) 191/90, pulse 62, height 5' 7.5" (1.715 m), weight 197 lb 8 oz (89.6 kg), SpO2 97 %.  Physical Exam  General: The patient is alert and cooperative at the time of the examination.  The patient is moderately obese.  Skin: No significant peripheral edema is noted.   Neurologic Exam  Mental status: The patient is alert and oriented x 3 at the time of the examination. The patient has apparent normal recent and remote memory, with an apparently normal attention span and concentration ability.   Cranial nerves: Facial symmetry is present. Speech is normal, no aphasia or dysarthria is noted. Extraocular movements are full. Visual fields are full.  Motor: The patient has good strength in all 4 extremities.  Sensory examination: Soft touch sensation is symmetric on the face, arms, and legs.  Coordination: The patient has good finger-nose-finger and heel-to-shin bilaterally.  Resting tremors noted on the left arm and occasionally the left leg.  Gait and station: The patient has a normal gait.  When walking, the patient has prominent tremors in the left arm and hand, slight decrease in arm swing is noted on the left.  Tandem gait is normal.  Romberg is negative. No drift is seen.  Reflexes: Deep tendon reflexes are symmetric.   Assessment/Plan:  1.  Left sided tremors, possible parkinsonism  2.  Headache, possible tension headache  The patient is having headaches coming up in the back of the neck, into the bifrontal area.  The headaches are better in the morning, worse as the day goes on consistent with tension headache.  The patient will be placed on amitriptyline, she has been on this medication previously and has tolerated it.  Amitriptyline has anticholinergic properties that may help neck stiffness and may also improve the tremor.  The patient will need to be followed for further developing signs of parkinsonism.  She will follow-up in 5 months.  She will call for any dose adjustments of amitriptyline.  Jill Alexanders MD 04/28/2018 11:55 AM  [facility] [address] [place], [place] [address]  Phone [phone] Fax [phone]

## 2018-04-28 NOTE — Telephone Encounter (Signed)
PA approved effective from 04/28/2018 through 04/29/2019. Faxed notice of approval to [facility] at [phone]. Received fax confirmation.

## 2018-04-28 NOTE — Telephone Encounter (Signed)
Called patient and apologized stating this RN had tried to reach her regarding her headaches. Asked her, per NP if she has tried any OTC medications for headaches. She stated "I don't know what you're talking about. Who is Ward Givens?"  This RN stated she is NP at [facility]. The patient stated "I have an appointment with Dr Jannifer Franklin. I am on my way out the door." She then ended the call.

## 2018-04-28 NOTE — Telephone Encounter (Signed)
Submitted PA amitriptyline on covermymeds. [employer] - Rx #: [id number]. Waiting on determination.   "Your information has been submitted to [place]. [employer] [place] will review the request and notify you of the determination decision directly, typically within 3 business days of your submission and once all necessary information is received. If [employer] has not responded within the specified timeframe or if you have any questions about your PA submission, contact [facility] directly at [facility]) [phone] or ([place]) [phone]."

## 2018-04-29 NOTE — Telephone Encounter (Signed)
Kristin Gilmore with [employer] requesting a call from RN to discuss the medication approval. Kristin Gilmore can be reached at [phone] option [phone]

## 2018-04-30 NOTE — Telephone Encounter (Signed)
I called [employer] back. They were only calling to notify us about approval. Nothing further needed.

## 2018-05-21 ENCOUNTER — Other Ambulatory Visit: Payer: Self-pay | Admitting: Neurology

## 2018-06-22 ENCOUNTER — Other Ambulatory Visit: Payer: Self-pay | Admitting: Neurology

## 2018-06-30 ENCOUNTER — Ambulatory Visit: Payer: Medicare Other | Attending: Sports Medicine | Admitting: Physical Therapy

## 2018-06-30 ENCOUNTER — Other Ambulatory Visit: Payer: Self-pay

## 2018-06-30 DIAGNOSIS — M25571 Pain in right ankle and joints of right foot: Secondary | ICD-10-CM | POA: Diagnosis not present

## 2018-06-30 DIAGNOSIS — M6281 Muscle weakness (generalized): Secondary | ICD-10-CM | POA: Diagnosis present

## 2018-06-30 DIAGNOSIS — R2689 Other abnormalities of gait and mobility: Secondary | ICD-10-CM

## 2018-06-30 DIAGNOSIS — M25671 Stiffness of right ankle, not elsewhere classified: Secondary | ICD-10-CM

## 2018-06-30 NOTE — Therapy (Signed)
[facility] [address], [place], [place], [address] Phone: [phone]   Fax:  [phone]  Physical Therapy Evaluation  Patient Details  Name: Kristin Gilmore MRN: [id number] Date of Birth: [birth year] Referring Provider (PT): Vickki Hearing, MD    Encounter Date: 06/30/2018  PT End of Session - 06/30/18 1306    Visit Number  1    Date for PT Re-Evaluation  08/31/18    Authorization Type  Medicare A and B     Authorization - Visit Number  1    Authorization - Number of Visits  10    PT Start Time  1016    PT Stop Time  1572    PT Time Calculation (min)  42 min    Activity Tolerance  No increased pain;Patient tolerated treatment well    Behavior During Therapy  [facility] for tasks assessed/performed       Past Medical History:  Diagnosis Date  . Arthritis   . Depression   . Dyslipidemia   . Hypertension   . Migraine   . Migraine without aura, without mention of intractable migraine without mention of status migrainosus 04/13/2014  . Pituitary microadenoma ([place]) 04/13/2014  . Pre-diabetes    "i was told i was pre-diabetic a while ago"  . Tremor 04/13/2014    Past Surgical History:  Procedure Laterality Date  . CHOLECYSTECTOMY    . COLONOSCOPY W/ POLYPECTOMY    . LUMBAR LAMINECTOMY/DECOMPRESSION MICRODISCECTOMY N/A 12/19/2017   Procedure: LAMINECTOMY LUMBAR TWO- LUMBAR THREE, LUMBAR THREE- LUMBAR FOUR, LUMBAR FOUR- LUMBAR FIVE ;  Surgeon: Consuella Lose, MD;  Location: [place];  Service: Neurosurgery;  Laterality: N/A;  . NASAL SINUS SURGERY    . TONSILLECTOMY    . WISDOM TOOTH EXTRACTION      There were no vitals filed for this visit.   Subjective Assessment - 06/30/18 1021    Subjective  Pt reports that her Rt heel has been bothering her since the end of last August. It started out of nowhere, and she would typically go to the pool 3x/week (gentle exercise). Since then, her pain has been about the same.  She went to the orthopedic MD who put her on some medication and referred her to PT.    Pertinent History  Lx surgery 12/19/2017; high blood pressure (620-355H diastolic BP)    Limitations  Walking    How long can you walk comfortably?  immediate     Patient Stated Goals  decrease pain with walking     Currently in Pain?  No/denies    Pain Score  8     Pain Location  Heel    Pain Orientation  Right;Posterior    Pain Descriptors / Indicators  Aching;Tightness    Pain Type  Acute pain    Pain Radiating Towards  heel up to mid calf     Pain Onset  More than a month ago    Pain Frequency  Constant    Aggravating Factors   walking     Pain Relieving Factors  avoiding walking     Effect of Pain on Daily Activities  limited walking          [facility] PT Assessment - 06/30/18 0001      Assessment   Medical Diagnosis  Rt achilles tendonitis    Referring Provider (PT)  Vickki Hearing, MD     Onset Date/Surgical Date  --   End of August  Next MD Visit  none for now     Prior Therapy  none       Precautions   Precautions  None      Restrictions   Weight Bearing Restrictions  No      Balance Screen   Has the patient fallen in the past 6 months  No    Has the patient had a decrease in activity level because of a fear of falling?   No    Is the patient reluctant to leave their home because of a fear of falling?   No      Prior Function   Level of Independence  Independent      Observation/Other Assessments   Focus on Therapeutic Outcomes (FOTO)   to be completed next visit       ROM / Strength   AROM / PROM / Strength  AROM;Strength      AROM   AROM Assessment Site  Ankle    Right/Left Ankle  Right;Left    Right Ankle Dorsiflexion  0   pain stretch    Right Ankle Plantar Flexion  35   pain    Right Ankle Inversion  35    Right Ankle Eversion  25    Left Ankle Dorsiflexion  0    Left Ankle Plantar Flexion  35    Left Ankle Inversion  35    Left Ankle Eversion  25       Strength   Overall Strength Comments  Pt unable to complete single leg heel raise on Rt     Strength Assessment Site  Ankle    Right/Left Ankle  Right;Left    Right Ankle Dorsiflexion  4/5    Right Ankle Inversion  4/5    Right Ankle Eversion  4/5    Left Ankle Dorsiflexion  4/5    Left Ankle Inversion  4/5    Left Ankle Eversion  4/5      Palpation   Palpation comment  tenderness along Rt gastroc and soleus       Ambulation/Gait   Pre-Gait Activities  Pt ascends 6" steps with reciprocal pattern, descends with Rt step to pattern and B handrails     Gait Comments  decreased push off on Rt, antalgic gait       High Level Balance   High Level Balance Comments  SLS: Rt 7 sec (pain), Lt 8 sec                 Objective measurements completed on examination: See above findings.      [facility] PT Treatment/Exercise - 06/30/18 0001      Exercises   Exercises  Ankle      Ankle Exercises: Stretches   Gastroc Stretch  1 rep;30 seconds    Gastroc Stretch Limitations  sitting with strap     Other Stretch  Lt ankle PF stretch 2x20 sec seated    Other Stretch  Rt great toe extension stretch 3x10 sec seated       Ankle Exercises: Seated   Ankle Circles/Pumps  AROM;Right;10 reps    Ankle Circles/Pumps Limitations  clockwise/counterclockwise             PT Education - 06/30/18 1304    Education Details  eval findings/POC; importance of increasing mobility/strength as tendon heals; implemented and reviewed HEP    Person(s) Educated  Patient    Methods  Explanation;Verbal cues;Tactile cues;Handout    Comprehension  Verbalized understanding;Returned  demonstration       PT Short Term Goals - 06/30/18 1316      PT SHORT TERM GOAL #1   Title  Pt will demo consistency and independence with her initial HEP to improve flexibility and decrease pain.    Time  4    Period  Weeks    Status  New    Target Date  07/28/18      PT SHORT TERM GOAL #2   Title  Pt will report  atleast 30% reduction in her pain from the start of PT which will promote increased participation in activity in the community.     Time  4    Period  Weeks    Status  New      PT SHORT TERM GOAL #3   Title  Pt will demo improved ankle ROM to atleast 10 deg DF and 45 deg PF which will improve her mechanics with ambulation.    Time  4    Period  Weeks    Status  New        PT Long Term Goals - 06/30/18 1317      PT LONG TERM GOAL #1   Title  Pt will demo improved gastroc strength evident by her ability to complete atleast 15 single leg heel raises on each LE without difficulty or increase in pain.     Time  8    Period  Weeks    Status  New    Target Date  08/31/18      PT LONG TERM GOAL #2   Title  Pt will report atleast 60% reduction in her pain from the start of PT which will increase her ability to go grocery shopping without limitation.     Time  8    Period  Weeks    Status  New      PT LONG TERM GOAL #3   Title  Pt will demonstrate improved single leg proprioception evident by her ability to maintain single leg balance on stable surface up to 15 sec, 2/3 trials without LOB.    Time  8    Period  Weeks    Status  New    Target Date  08/31/18      PT LONG TERM GOAL #4   Title  Pt will be able to ascend and descend atleast 4, 6" steps with reciprocal pattern and without increase in Rt heel pain, 2/3 trials.     Time  8    Period  Weeks    Status  New      PT LONG TERM GOAL #5   Title  Pt will ambulate atleast 148ft indoors with symmetrical step length, push off and without reports of increased pain.     Time  8    Period  Weeks    Status  New             Plan - 06/30/18 1310    Clinical Impression Statement  Pt is a 67 y.o F referred to OPPT with complaints of Rt heel pain during walking primarily. She has palpable tenderness along the Rt achilles tendon, along with distal swelling consistent with MD diagnosis of achilles tendonitis. In addition to pain and  swelling, pt has limited B ankle mobility, limited gastroc strength, and poor ankle proprioception. Pt is only able to reach active dorsiflexion to neutral which is evident during ambulation with decreased foot clearance. She would benefit from skilled PT  to address her limitations in ankle ROM, ankle strength, proprioception and gradually progress tendon load for optimal healing and return to regular activity without limitation.     Clinical Presentation  Stable    Clinical Presentation due to:  unchanged since onset     Clinical Decision Making  Low    Rehab Potential  Good    PT Frequency  2x / week    PT Duration  8 weeks    PT Treatment/Interventions  ADLs/Self Care Home Management;Moist Heat;Cryotherapy;Iontophoresis 4mg /ml Dexamethasone;Therapeutic exercise;Patient/family education;Manual techniques;Passive range of motion;Therapeutic activities;Neuromuscular re-education;Functional mobility training;Stair training;Balance training;Dry needling;Taping    PT Next Visit Plan  NEED FOTO; f/u on HEP adherence; ankle mobilizations to improve DF/PF; ankle 4 way; gentle ankle proprioception; pt may like gameready end of session    PT Home Exercise Plan  Access Code: [email or web address]    Consulted and Agree with Plan of Care  Patient       Patient will benefit from skilled therapeutic intervention in order to improve the following deficits and impairments:  Abnormal gait, Decreased balance, Decreased endurance, Decreased mobility, Difficulty walking, Hypomobility, Increased muscle spasms, Improper body mechanics, Increased edema, Decreased range of motion, Decreased safety awareness, Decreased strength, Impaired flexibility, Postural dysfunction, Pain, Decreased activity tolerance  Visit Diagnosis: Pain in right ankle and joints of right foot  Stiffness of right ankle, not elsewhere classified  Other abnormalities of gait and mobility  Muscle weakness (generalized)     Problem List Patient Active  Problem List   Diagnosis Date Noted  . Lumbar spinal stenosis 12/19/2017  . Pain in joint, ankle and foot 05/02/2016  . Chronic venous insufficiency 05/02/2016  . Peripheral edema 11/01/2015  . Intractable chronic migraine without aura 11/02/2014  . Tremor 04/13/2014  . Migraine without aura 04/13/2014  . Pituitary microadenoma ([place]) 04/13/2014    1:21 PM,06/30/18 Sherol Dade PT, DPT [place] [facility] [address] [place], [place], [address] Phone: [phone]   Fax:  [phone]  Name: CALIANN LECKRONE MRN: [id number] Date of Birth: 08-Feb-1951

## 2018-06-30 NOTE — Patient Instructions (Signed)
Access Code: [email or web address]  URL: [email or web address]  Date: 06/30/2018  Prepared by: Sherol Dade   Exercises  Seated Self Great Toe Stretch - 10 reps - 5 hold - 2x daily - 7x weekly  Seated Gastroc Stretch with Strap - 3 reps - 3 sets - 30 hold - 1x daily - 7x weekly  Gastroc Stretch on Wall - 3 reps - 3 sets - 30 hold - 1x daily - 7x weekly  Seated Ankle Circles - 10 reps - 3 sets - 1x daily - 7x weekly  Seated Ankle Plantarflexion Dorsiflexion PROM - 3 reps - 3 sets - 30 hold - 1x daily - 7x weekly    [facility] [address] [place], [place] [address] Phone # [phone] Fax [phone]

## 2018-07-02 ENCOUNTER — Ambulatory Visit: Payer: Medicare Other | Admitting: Physical Therapy

## 2018-07-02 ENCOUNTER — Encounter: Payer: Self-pay | Admitting: Physical Therapy

## 2018-07-02 DIAGNOSIS — R2689 Other abnormalities of gait and mobility: Secondary | ICD-10-CM

## 2018-07-02 DIAGNOSIS — M25671 Stiffness of right ankle, not elsewhere classified: Secondary | ICD-10-CM

## 2018-07-02 DIAGNOSIS — M6281 Muscle weakness (generalized): Secondary | ICD-10-CM

## 2018-07-02 DIAGNOSIS — M25571 Pain in right ankle and joints of right foot: Secondary | ICD-10-CM

## 2018-07-02 NOTE — Therapy (Signed)
[facility] [address], [place], [place], [address] Phone: [phone]   Fax:  [phone]  Physical Therapy Treatment  Patient Details  Name: Kristin Gilmore MRN: [id number] Date of Birth: 1950/12/19 Referring Provider (PT): Vickki Hearing, MD    Encounter Date: 07/02/2018  PT End of Session - 07/02/18 1721    Visit Number  2    Date for PT Re-Evaluation  08/31/18    Authorization Type  Medicare A and B     Authorization - Visit Number  2    Authorization - Number of Visits  10    PT Start Time  1532    PT Stop Time  1625    PT Time Calculation (min)  53 min    Activity Tolerance  No increased pain;Patient tolerated treatment well    Behavior During Therapy  [facility] for tasks assessed/performed       Past Medical History:  Diagnosis Date  . Arthritis   . Depression   . Dyslipidemia   . Hypertension   . Migraine   . Migraine without aura, without mention of intractable migraine without mention of status migrainosus 04/13/2014  . Pituitary microadenoma ([place]) 04/13/2014  . Pre-diabetes    "i was told i was pre-diabetic a while ago"  . Tremor 04/13/2014    Past Surgical History:  Procedure Laterality Date  . CHOLECYSTECTOMY    . COLONOSCOPY W/ POLYPECTOMY    . LUMBAR LAMINECTOMY/DECOMPRESSION MICRODISCECTOMY N/A 12/19/2017   Procedure: LAMINECTOMY LUMBAR TWO- LUMBAR THREE, LUMBAR THREE- LUMBAR FOUR, LUMBAR FOUR- LUMBAR FIVE ;  Surgeon: Consuella Lose, MD;  Location: [place];  Service: Neurosurgery;  Laterality: N/A;  . NASAL SINUS SURGERY    . TONSILLECTOMY    . WISDOM TOOTH EXTRACTION      There were no vitals filed for this visit.  Subjective Assessment - 07/02/18 1542    Subjective  Pt reports that her pain is pretty high when walking in today. She has no pain when sitting. She has some concerns with her HEP.    Pertinent History  Lx surgery 12/19/2017; high blood pressure (829-937J diastolic BP)    Limitations  Walking    How long can you walk comfortably?  immediate     Patient Stated Goals  decrease pain with walking     Currently in Pain?  No/denies    Pain Onset  More than a month ago         [facility] PT Assessment - 07/02/18 0001      AROM   Right Ankle Dorsiflexion  5   end of session                   [facility] Adult PT Treatment/Exercise - 07/02/18 0001      Modalities   Modalities  Vasopneumatic      Vasopneumatic   Number Minutes Vasopneumatic   10 minutes    Vasopnuematic Location   Ankle   Rt    Vasopneumatic Pressure  Low   can increase to medium compression   Vasopneumatic Temperature   36      Manual Therapy   Manual Therapy  Joint mobilization    Joint Mobilization  Grade IV AP talocrural joint mobilization on Rt x3 bouts; Rt ankle PF MWM stretch 2x10 reps       Ankle Exercises: Seated   Ankle Circles/Pumps  AROM;Right;10 reps    Ankle Circles/Pumps Limitations  clockwise/counterclockwise    Toe Raise  Limitations  Rt x10 reps (attempts)    Other Seated Ankle Exercises  B ankle rocker board anterior/posterior x20 reps       Ankle Exercises: Stretches   Gastroc Stretch  1 rep;30 seconds    Gastroc Stretch Limitations  standing     Other Stretch  Rt ankle PF stretch 2x20 sec seated       Therex: Rt great toe extension stretch 2x10 sec hold       PT Education - 07/02/18 1720    Education Details  technique with therex and HEP review; implications for exercises, manual treatment and modality completed during session     Person(s) Educated  Patient    Methods  Explanation;Verbal cues;Tactile cues;Demonstration    Comprehension  Verbalized understanding;Returned demonstration       PT Short Term Goals - 06/30/18 1316      PT SHORT TERM GOAL #1   Title  Pt will demo consistency and independence with her initial HEP to improve flexibility and decrease pain.    Time  4    Period  Weeks    Status  New    Target Date  07/28/18       PT SHORT TERM GOAL #2   Title  Pt will report atleast 30% reduction in her pain from the start of PT which will promote increased participation in activity in the community.     Time  4    Period  Weeks    Status  New      PT SHORT TERM GOAL #3   Title  Pt will demo improved ankle ROM to atleast 10 deg DF and 45 deg PF which will improve her mechanics with ambulation.    Time  4    Period  Weeks    Status  New        PT Long Term Goals - 06/30/18 1317      PT LONG TERM GOAL #1   Title  Pt will demo improved gastroc strength evident by her ability to complete atleast 15 single leg heel raises on each LE without difficulty or increase in pain.     Time  8    Period  Weeks    Status  New    Target Date  08/31/18      PT LONG TERM GOAL #2   Title  Pt will report atleast 60% reduction in her pain from the start of PT which will increase her ability to go grocery shopping without limitation.     Time  8    Period  Weeks    Status  New      PT LONG TERM GOAL #3   Title  Pt will demonstrate improved single leg proprioception evident by her ability to maintain single leg balance on stable surface up to 15 sec, 2/3 trials without LOB.    Time  8    Period  Weeks    Status  New    Target Date  08/31/18      PT LONG TERM GOAL #4   Title  Pt will be able to ascend and descend atleast 4, 6" steps with reciprocal pattern and without increase in Rt heel pain, 2/3 trials.     Time  8    Period  Weeks    Status  New      PT LONG TERM GOAL #5   Title  Pt will ambulate atleast 158ft indoors with symmetrical step length, push off  and without reports of increased pain.     Time  8    Period  Weeks    Status  New            Plan - 07/02/18 1721    Clinical Impression Statement  Pt arrived with concerns over how she is completing her HEP. Therapist attempted to reassure pt of her technique and provided detailed written and verbal instruction for home. Today's session also focused on  therex to improve intrinsic strength of the foot and Rt ankle ROM/mobility. Following today's session, Rt ankle dorsiflexion improved to 5 deg. Pt demonstrated improved push off with ambulation out of clinic following ice/compression. Will continue to follow up on HEP understanding and progress ROM, strength, proprioception as able.     Rehab Potential  Good    PT Frequency  2x / week    PT Duration  8 weeks    PT Treatment/Interventions  ADLs/Self Care Home Management;Moist Heat;Cryotherapy;Iontophoresis 4mg /ml Dexamethasone;Therapeutic exercise;Patient/family education;Manual techniques;Passive range of motion;Therapeutic activities;Neuromuscular re-education;Functional mobility training;Stair training;Balance training;Dry needling;Taping    PT Next Visit Plan  FOTO; f/u on HEP adherence; ankle mobilizations to improve DF/PF; ankle 4 way addition to HEP; gentle ankle proprioception; pt may like gameready end of session    PT Home Exercise Plan  Access Code: [email or web address]    Consulted and Agree with Plan of Care  Patient       Patient will benefit from skilled therapeutic intervention in order to improve the following deficits and impairments:  Abnormal gait, Decreased balance, Decreased endurance, Decreased mobility, Difficulty walking, Hypomobility, Increased muscle spasms, Improper body mechanics, Increased edema, Decreased range of motion, Decreased safety awareness, Decreased strength, Impaired flexibility, Postural dysfunction, Pain, Decreased activity tolerance  Visit Diagnosis: Pain in right ankle and joints of right foot  Stiffness of right ankle, not elsewhere classified  Other abnormalities of gait and mobility  Muscle weakness (generalized)     Problem List Patient Active Problem List   Diagnosis Date Noted  . Lumbar spinal stenosis 12/19/2017  . Pain in joint, ankle and foot 05/02/2016  . Chronic venous insufficiency 05/02/2016  . Peripheral edema 11/01/2015  . Intractable  chronic migraine without aura 11/02/2014  . Tremor 04/13/2014  . Migraine without aura 04/13/2014  . 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 [place], [place], [address] Phone: [phone]   Fax:  [phone]  Name: Kristin Gilmore MRN: [id number] Date of Birth: 1951-01-29

## 2018-07-03 ENCOUNTER — Encounter

## 2018-07-07 ENCOUNTER — Encounter

## 2018-07-07 ENCOUNTER — Encounter: Payer: Self-pay | Admitting: Physical Therapy

## 2018-07-07 ENCOUNTER — Ambulatory Visit: Payer: Medicare Other | Admitting: Physical Therapy

## 2018-07-07 DIAGNOSIS — M25671 Stiffness of right ankle, not elsewhere classified: Secondary | ICD-10-CM

## 2018-07-07 DIAGNOSIS — M6281 Muscle weakness (generalized): Secondary | ICD-10-CM

## 2018-07-07 DIAGNOSIS — M25571 Pain in right ankle and joints of right foot: Secondary | ICD-10-CM

## 2018-07-07 DIAGNOSIS — R2689 Other abnormalities of gait and mobility: Secondary | ICD-10-CM

## 2018-07-07 NOTE — Therapy (Signed)
[facility] [address], [place], [place], [address] Phone: [phone]   Fax:  [phone]  Physical Therapy Treatment  Patient Details  Name: Kristin Gilmore MRN: [id number] Date of Birth: 1951-03-20 Referring Provider (PT): Vickki Hearing, MD    Encounter Date: 07/07/2018  PT End of Session - 07/07/18 1958    Visit Number  3    Date for PT Re-Evaluation  08/31/18    Authorization Type  Medicare A and B     Authorization - Visit Number  3    Authorization - Number of Visits  10    PT Start Time  6962    PT Stop Time  1528    PT Time Calculation (min)  42 min    Activity Tolerance  Patient tolerated treatment well       Past Medical History:  Diagnosis Date  . Arthritis   . Depression   . Dyslipidemia   . Hypertension   . Migraine   . Migraine without aura, without mention of intractable migraine without mention of status migrainosus 04/13/2014  . Pituitary microadenoma ([place]) 04/13/2014  . Pre-diabetes    "i was told i was pre-diabetic a while ago"  . Tremor 04/13/2014    Past Surgical History:  Procedure Laterality Date  . CHOLECYSTECTOMY    . COLONOSCOPY W/ POLYPECTOMY    . LUMBAR LAMINECTOMY/DECOMPRESSION MICRODISCECTOMY N/A 12/19/2017   Procedure: LAMINECTOMY LUMBAR TWO- LUMBAR THREE, LUMBAR THREE- LUMBAR FOUR, LUMBAR FOUR- LUMBAR FIVE ;  Surgeon: Consuella Lose, MD;  Location: [facility];  Service: Neurosurgery;  Laterality: N/A;  . NASAL SINUS SURGERY    . TONSILLECTOMY    . WISDOM TOOTH EXTRACTION      There were no vitals filed for this visit.  Subjective Assessment - 07/07/18 1451    Subjective  When I walk the pain is a 7.  Doesn't  hurt at rest.  The patient reports she is frustrated by her home exercise program and is unsure how to perform them.  She has her initial copy but the pages have been torn up and taped back together.  "I don't understand what sets means."  Reports no change from  vasocompression.      Pertinent History  Lx surgery 12/19/2017; high blood pressure (952-841L diastolic BP)    Currently in Pain?  Yes    Pain Score  7     Pain Location  Heel    Pain Orientation  Right    Pain Type  Acute pain    Pain Onset  More than a month ago    Aggravating Factors   walking                        [facility] Adult PT Treatment/Exercise - 07/07/18 0001      Self-Care   Self-Care  Heat/Ice Application      Manual Therapy   Joint Mobilization  Grade 2 metatarsal mobs, calcaneal mobs, subtalarAP    Soft tissue mobilization  fascial stretch; gastroc/soleus and around Achilles       Ankle Exercises: Stretches   Gastroc Stretch  1 rep;30 seconds    Other Stretch  Rt ankle PF stretch 2x20 sec seated    Other Stretch  Rt great toe extension stretch 3x10 sec seated       Ankle Exercises: Seated   Ankle Circles/Pumps Limitations  clockwise/counterclockwise    Other Seated Ankle Exercises  red band ankle  plantarflexion, dorsiflexion and eversion 10x each              PT Education - 07/07/18 1956    Education Details  extensive review of initial HEP; reprinted in large print and decreased to sets of 1     Person(s) Educated  Patient    Methods  Explanation;Demonstration;Handout    Comprehension  Returned demonstration;Verbalized understanding       PT Short Term Goals - 06/30/18 1316      PT SHORT TERM GOAL #1   Title  Pt will demo consistency and independence with her initial HEP to improve flexibility and decrease pain.    Time  4    Period  Weeks    Status  New    Target Date  07/28/18      PT SHORT TERM GOAL #2   Title  Pt will report atleast 30% reduction in her pain from the start of PT which will promote increased participation in activity in the community.     Time  4    Period  Weeks    Status  New      PT SHORT TERM GOAL #3   Title  Pt will demo improved ankle ROM to atleast 10 deg DF and 45 deg PF which will improve her  mechanics with ambulation.    Time  4    Period  Weeks    Status  New        PT Long Term Goals - 06/30/18 1317      PT LONG TERM GOAL #1   Title  Pt will demo improved gastroc strength evident by her ability to complete atleast 15 single leg heel raises on each LE without difficulty or increase in pain.     Time  8    Period  Weeks    Status  New    Target Date  08/31/18      PT LONG TERM GOAL #2   Title  Pt will report atleast 60% reduction in her pain from the start of PT which will increase her ability to go grocery shopping without limitation.     Time  8    Period  Weeks    Status  New      PT LONG TERM GOAL #3   Title  Pt will demonstrate improved single leg proprioception evident by her ability to maintain single leg balance on stable surface up to 15 sec, 2/3 trials without LOB.    Time  8    Period  Weeks    Status  New    Target Date  08/31/18      PT LONG TERM GOAL #4   Title  Pt will be able to ascend and descend atleast 4, 6" steps with reciprocal pattern and without increase in Rt heel pain, 2/3 trials.     Time  8    Period  Weeks    Status  New      PT LONG TERM GOAL #5   Title  Pt will ambulate atleast 169ft indoors with symmetrical step length, push off and without reports of increased pain.     Time  8    Period  Weeks    Status  New            Plan - 07/07/18 1452    Clinical Impression Statement  The patient called the clinic several times yesterday reporting frustration in her understanding of her HEP and told  the office staff that she "tore up the paper."  Extensive time spent on re-instruction of that HEP.  Reprinted in large print and simplified to do only 1 set of 10 repetitions for the majority of them.  She reports a good understanding of them and is able to demonstrate how to perform each of them.  Will add red band ex's next visit if she is able to perform the ex without frustration.  Tender points noted in distal gastroc and soleus  muscles.  Improved soft tissue length and joint mobility following manual therapy.  Will hold on vasocompression today.      Rehab Potential  Good    PT Frequency  2x / week    PT Duration  8 weeks    PT Treatment/Interventions  ADLs/Self Care Home Management;Moist Heat;Cryotherapy;Iontophoresis 4mg /ml Dexamethasone;Therapeutic exercise;Patient/family education;Manual techniques;Passive range of motion;Therapeutic activities;Neuromuscular re-education;Functional mobility training;Stair training;Balance training;Dry needling;Taping    PT Next Visit Plan   f/u on HEP adherence; ankle mobilizations and soft tissue work  to improve DF/PF; ankle red band  4 way addition to HEP; gentle ankle proprioception    PT Home Exercise Plan  Access Code: [place]       Patient will benefit from skilled therapeutic intervention in order to improve the following deficits and impairments:  Abnormal gait, Decreased balance, Decreased endurance, Decreased mobility, Difficulty walking, Hypomobility, Increased muscle spasms, Improper body mechanics, Increased edema, Decreased range of motion, Decreased safety awareness, Decreased strength, Impaired flexibility, Postural dysfunction, Pain, Decreased activity tolerance  Visit Diagnosis: Pain in right ankle and joints of right foot  Stiffness of right ankle, not elsewhere classified  Other abnormalities of gait and mobility  Muscle weakness (generalized)     Problem List Patient Active Problem List   Diagnosis Date Noted  . Lumbar spinal stenosis 12/19/2017  . Pain in joint, ankle and foot 05/02/2016  . Chronic venous insufficiency 05/02/2016  . Peripheral edema 11/01/2015  . Intractable chronic migraine without aura 11/02/2014  . Tremor 04/13/2014  . Migraine without aura 04/13/2014  . Pituitary microadenoma ([place]) 04/13/2014   Ruben Im, PT 07/07/18 8:08 PM Phone: [phone] Fax: [phone]  Alvera Singh 07/07/2018, 8:06 PM  [facility] [address], [place], [place], [address] Phone: [phone]   Fax:  [phone]  Name: EMRY TOBIN MRN: [id number] Date of Birth: 08/04/1951

## 2018-07-09 ENCOUNTER — Ambulatory Visit: Payer: Medicare Other | Admitting: Physical Therapy

## 2018-07-09 ENCOUNTER — Encounter: Payer: Self-pay | Admitting: Physical Therapy

## 2018-07-09 DIAGNOSIS — M6281 Muscle weakness (generalized): Secondary | ICD-10-CM

## 2018-07-09 DIAGNOSIS — M25671 Stiffness of right ankle, not elsewhere classified: Secondary | ICD-10-CM

## 2018-07-09 DIAGNOSIS — R2689 Other abnormalities of gait and mobility: Secondary | ICD-10-CM

## 2018-07-09 DIAGNOSIS — M25571 Pain in right ankle and joints of right foot: Secondary | ICD-10-CM
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
## 2018-07-09 NOTE — Patient Instructions (Addendum)
Access Code: [email or web address]  URL: [email or web address]  Date: 07/09/2018  Prepared by: Ruben Im   Exercises  Seated Self Great Toe Stretch - 10 reps - 5 hold - 2x daily - 7x weekly  Seated Gastroc Stretch with Strap - 3 reps - 1 sets - 30 hold - 1x daily - 7x weekly  Gastroc Stretch on Wall - 3 reps - 1 sets - 30 hold - 1x daily - 7x weekly  Seated Ankle Circles - 10 reps - 1 sets - 1x daily - 7x weekly  Seated Ankle Plantarflexion Dorsiflexion PROM - 3 reps - 1 sets - 30 hold - 1x daily - 7x weekly  Seated Ankle Plantar Flexion with Resistance Loop - 15 reps - 1 sets - 1x daily - 7x weekly  Seated Ankle Dorsiflexion with Resistance - 15 reps - 1 sets - 1x daily - 7x weekly  Seated Ankle Eversion with Resistance - 15 reps - 1 sets - 1x daily - 7x weekly     IONTOPHORESIS PATIENT PRECAUTIONS & CONTRAINDICATIONS:  . Redness under one or both electrodes can occur.  This characterized by a uniform redness that usually disappears within 12 hours of treatment. . Small pinhead size blisters may result in response to the drug.  Contact your physician if the problem persists more than 24 hours. . On rare occasions, iontophoresis therapy can result in temporary skin reactions such as rash, inflammation, irritation or burns.  The skin reactions may be the result of individual sensitivity to the ionic solution used, the condition of the skin at the start of treatment, reaction to the materials in the electrodes, allergies or sensitivity to dexamethasone, or a poor connection between the patch and your skin.  Discontinue using iontophoresis if you have any of these reactions and report to your therapist. . Remove the Patch or electrodes if you have any undue sensation of pain or burning during the treatment and report discomfort to your therapist. . Tell your Therapist if you have had known adverse reactions to the application of electrical current. . If using the Patch, the LED light will  turn off when treatment is complete and the patch can be removed.  Approximate treatment time is 1-3 hours.  Remove the patch when light goes off or after 6 hours. . The Patch can be worn during normal activity, however excessive motion where the electrodes have been placed can cause poor contact between the skin and the electrode or uneven electrical current resulting in greater risk of skin irritation. Marland Kitchen Keep out of the reach of children.   . DO NOT use if you have a cardiac pacemaker or any other electrically sensitive implanted device. . DO NOT use if you have a known sensitivity to dexamethasone. . DO NOT use during Magnetic Resonance Imaging (MRI). . DO NOT use over broken or compromised skin (e.g. sunburn, cuts, or acne) due to the increased risk of skin reaction. . DO NOT SHAVE over the area to be treated:  To establish good contact between the Patch and the skin, excessive hair may be clipped. . DO NOT place the Patch or electrodes on or over your eyes, directly over your heart, or brain. . DO NOT reuse the Patch or electrodes as this may cause burns to occur.

## 2018-07-14 ENCOUNTER — Encounter: Payer: Self-pay | Admitting: Physical Therapy

## 2018-07-14 ENCOUNTER — Ambulatory Visit: Payer: Medicare Other | Admitting: Physical Therapy

## 2018-07-14 DIAGNOSIS — M25671 Stiffness of right ankle, not elsewhere classified: Secondary | ICD-10-CM

## 2018-07-14 DIAGNOSIS — M25571 Pain in right ankle and joints of right foot: Secondary | ICD-10-CM

## 2018-07-14 DIAGNOSIS — M6281 Muscle weakness (generalized): Secondary | ICD-10-CM

## 2018-07-14 DIAGNOSIS — R2689 Other abnormalities of gait and mobility: Secondary | ICD-10-CM

## 2018-07-14 NOTE — Therapy (Signed)
[facility] [address], [place], [place], [address] Phone: [phone]   Fax:  [phone]  Physical Therapy Treatment  Patient Details  Name: Kristin Gilmore Elms MRN: [id number] Date of Birth: 06-Jul-1951 Referring Provider (PT): Vickki Hearing, MD    Encounter Date: 07/14/2018  PT End of Session - 07/14/18 1218    Visit Number  5    Date for PT Re-Evaluation  08/31/18    Authorization Type  Medicare A and B     Authorization - Visit Number  5    Authorization - Number of Visits  10    PT Start Time  1146    PT Stop Time  7341    PT Time Calculation (min)  49 min    Activity Tolerance  Patient limited by pain       Past Medical History:  Diagnosis Date  . Arthritis   . Depression   . Dyslipidemia   . Hypertension   . Migraine   . Migraine without aura, without mention of intractable migraine without mention of status migrainosus 04/13/2014  . Pituitary microadenoma ([place]) 04/13/2014  . Pre-diabetes    "i was told i was pre-diabetic a while ago"  . Tremor 04/13/2014    Past Surgical History:  Procedure Laterality Date  . CHOLECYSTECTOMY    . COLONOSCOPY W/ POLYPECTOMY    . LUMBAR LAMINECTOMY/DECOMPRESSION MICRODISCECTOMY N/A 12/19/2017   Procedure: LAMINECTOMY LUMBAR TWO- LUMBAR THREE, LUMBAR THREE- LUMBAR FOUR, LUMBAR FOUR- LUMBAR FIVE ;  Surgeon: Consuella Lose, MD;  Location: [place];  Service: Neurosurgery;  Laterality: N/A;  . NASAL SINUS SURGERY    . TONSILLECTOMY    . WISDOM TOOTH EXTRACTION      There were no vitals filed for this visit.  Subjective Assessment - 07/14/18 1149    Subjective  Pt reports that her ankle is still hurting her. She has not noticed a difference after the taping and iontophoresis.     Pertinent History  Lx surgery 12/19/2017; high blood pressure (937-902I diastolic BP)    Limitations  Walking    Currently in Pain?  No/denies                        [facility] Adult PT Treatment/Exercise - 07/14/18 0001      Vasopneumatic   Number Minutes Vasopneumatic   10 minutes    Vasopnuematic Location   Ankle   Rt   Vasopneumatic Pressure  Medium    Vasopneumatic Temperature   34      Manual Therapy   Joint Mobilization  Rt Grade III-IV medial and lateral subtalar joint mobilization x4 bouts each direction; Grade III-IV talocrural joint mobilization x3 bouts     Soft tissue mobilization  STM Rt gastroc and soleus      Ankle Exercises: Standing   Other Standing Ankle Exercises  weight shift Lt/Rt x30 sec              PT Education - 07/14/18 1218    Education Details  implications for manual treatment; expectations of healing/recovery time    Person(s) Educated  Patient    Methods  Explanation    Comprehension  Verbalized understanding       PT Short Term Goals - 06/30/18 1316      PT SHORT TERM GOAL #1   Title  Pt will demo consistency and independence with her initial HEP to improve flexibility and decrease pain.  Time  4    Period  Weeks    Status  New    Target Date  07/28/18      PT SHORT TERM GOAL #2   Title  Pt will report atleast 30% reduction in her pain from the start of PT which will promote increased participation in activity in the community.     Time  4    Period  Weeks    Status  New      PT SHORT TERM GOAL #3   Title  Pt will demo improved ankle ROM to atleast 10 deg DF and 45 deg PF which will improve her mechanics with ambulation.    Time  4    Period  Weeks    Status  New        PT Long Term Goals - 06/30/18 1317      PT LONG TERM GOAL #1   Title  Pt will demo improved gastroc strength evident by her ability to complete atleast 15 single leg heel raises on each LE without difficulty or increase in pain.     Time  8    Period  Weeks    Status  New    Target Date  08/31/18      PT LONG TERM GOAL #2   Title  Pt will report atleast 60% reduction in her pain  from the start of PT which will increase her ability to go grocery shopping without limitation.     Time  8    Period  Weeks    Status  New      PT LONG TERM GOAL #3   Title  Pt will demonstrate improved single leg proprioception evident by her ability to maintain single leg balance on stable surface up to 15 sec, 2/3 trials without LOB.    Time  8    Period  Weeks    Status  New    Target Date  08/31/18      PT LONG TERM GOAL #4   Title  Pt will be able to ascend and descend atleast 4, 6" steps with reciprocal pattern and without increase in Rt heel pain, 2/3 trials.     Time  8    Period  Weeks    Status  New      PT LONG TERM GOAL #5   Title  Pt will ambulate atleast 126ft indoors with symmetrical step length, push off and without reports of increased pain.     Time  8    Period  Weeks    Status  New            Plan - 07/14/18 1237    Clinical Impression Statement  Pt arrived with continued complaints of Rt heel pain during ambulation. She does appear to walk with less antalgic gait compared to previous sessions. She reports good understanding of her HEP and a majority of today's session focused on manual techniques to improve ankle joint mobility and decrease trigger points throughout the gastroc and soleus. Pt had reports of back pain and gas which required her to need intermittent change in positions. Pt was agreeable to ice/compression end of session and reported some soreness following manual treatment.     Rehab Potential  Good    PT Frequency  2x / week    PT Duration  8 weeks    PT Treatment/Interventions  ADLs/Self Care Home Management;Moist Heat;Cryotherapy;Iontophoresis 4mg /ml Dexamethasone;Therapeutic exercise;Patient/family education;Manual techniques;Passive range of motion;Therapeutic  activities;Neuromuscular re-education;Functional mobility training;Stair training;Balance training;Dry needling;Taping    PT Next Visit Plan   ankle mobilizations and soft tissue  work to improve DF/PF; ankle red band; gentle ankle proprioception    PT Home Exercise Plan  Access Code: [place]       Patient will benefit from skilled therapeutic intervention in order to improve the following deficits and impairments:  Abnormal gait, Decreased balance, Decreased endurance, Decreased mobility, Difficulty walking, Hypomobility, Increased muscle spasms, Improper body mechanics, Increased edema, Decreased range of motion, Decreased safety awareness, Decreased strength, Impaired flexibility, Postural dysfunction, Pain, Decreased activity tolerance  Visit Diagnosis: Pain in right ankle and joints of right foot  Stiffness of right ankle, not elsewhere classified  Other abnormalities of gait and mobility  Muscle weakness (generalized)     Problem List Patient Active Problem List   Diagnosis Date Noted  . Lumbar spinal stenosis 12/19/2017  . Pain in joint, ankle and foot 05/02/2016  . Chronic venous insufficiency 05/02/2016  . Peripheral edema 11/01/2015  . Intractable chronic migraine without aura 11/02/2014  . Tremor 04/13/2014  . Migraine without aura 04/13/2014  . Pituitary microadenoma ([place]) 04/13/2014    1:17 PM,07/14/18 Sherol Dade PT, DPT [place] [facility] [address], [place], [place], [address] Phone: [phone]   Fax:  [phone]  Name: SHAWNTAVIA SAUNDERS MRN: [id number] Date of Birth: [birth year]

## 2018-07-16 ENCOUNTER — Ambulatory Visit: Payer: Medicare Other | Admitting: Physical Therapy

## 2018-07-21 ENCOUNTER — Encounter: Payer: Self-pay | Admitting: Physical Therapy

## 2018-07-21 ENCOUNTER — Ambulatory Visit: Payer: Medicare Other | Admitting: Physical Therapy

## 2018-07-21 DIAGNOSIS — M25571 Pain in right ankle and joints of right foot: Secondary | ICD-10-CM

## 2018-07-21 DIAGNOSIS — M6281 Muscle weakness (generalized): Secondary | ICD-10-CM

## 2018-07-21 DIAGNOSIS — R2689 Other abnormalities of gait and mobility: Secondary | ICD-10-CM

## 2018-07-21 DIAGNOSIS — M25671 Stiffness of right ankle, not elsewhere classified: Secondary | ICD-10-CM

## 2018-07-21 NOTE — Therapy (Signed)
[facility] [address] [place], [place], [address] Phone: [phone]   Fax:  [phone]  Physical Therapy Treatment  Patient Details  Name: Kristin Gilmore MRN: [id number] Date of Birth: Dec 04, 1950 Referring Provider (PT): Vickki Hearing, MD    Encounter Date: 07/21/2018  PT End of Session - 07/21/18 1225    Visit Number  6    Date for PT Re-Evaluation  08/31/18    Authorization Type  Medicare A and B     Authorization - Visit Number  6    Authorization - Number of Visits  10    PT Start Time  1146    PT Stop Time  7782    PT Time Calculation (min)  48 min    Activity Tolerance  No increased pain;Patient tolerated treatment well    Behavior During Therapy  [facility] for tasks assessed/performed       Past Medical History:  Diagnosis Date  . Arthritis   . Depression   . Dyslipidemia   . Hypertension   . Migraine   . Migraine without aura, without mention of intractable migraine without mention of status migrainosus 04/13/2014  . Pituitary microadenoma ([place]) 04/13/2014  . Pre-diabetes    "i was told i was pre-diabetic a while ago"  . Tremor 04/13/2014    Past Surgical History:  Procedure Laterality Date  . CHOLECYSTECTOMY    . COLONOSCOPY W/ POLYPECTOMY    . LUMBAR LAMINECTOMY/DECOMPRESSION MICRODISCECTOMY N/A 12/19/2017   Procedure: LAMINECTOMY LUMBAR TWO- LUMBAR THREE, LUMBAR THREE- LUMBAR FOUR, LUMBAR FOUR- LUMBAR FIVE ;  Surgeon: Consuella Lose, MD;  Location: [place];  Service: Neurosurgery;  Laterality: N/A;  . NASAL SINUS SURGERY    . TONSILLECTOMY    . WISDOM TOOTH EXTRACTION      There were no vitals filed for this visit.  Subjective Assessment - 07/21/18 1148    Subjective  Pt reports that she did exercises for the past week and they did not bother her heel. She has no pain currently.     Pertinent History  Lx surgery 12/19/2017; high blood pressure (423-536R diastolic BP)    Limitations   Walking    Currently in Pain?  No/denies         [facility] PT Assessment - 07/21/18 0001      AROM   Right Ankle Dorsiflexion  10   with knee slightly bent   Right Ankle Plantar Flexion  45                   [facility] Adult PT Treatment/Exercise - 07/21/18 0001      Vasopneumatic   Number Minutes Vasopneumatic   10 minutes    Vasopnuematic Location   Ankle    Vasopneumatic Pressure  Medium    Vasopneumatic Temperature   36      Manual Therapy   Joint Mobilization  Grade III-IV AP and PA talocrural joint mobilization x3 bouts each direction    Soft tissue mobilization  STM Rt gastroc and soleus  Rt ankle gastroc stretch 3x20 sec      Ankle Exercises: Seated   Other Seated Ankle Exercises  Rt ankle PF 2x15 reps with green TB; Rt ankle DF with green TB x20 reps; Rt ankle eversion x20 reps with green TB; Rt ankle inversion with green TB 2x15 reps     Other Seated Ankle Exercises  B rocker board anterior/posterior x20 reps  PT Education - 07/21/18 1224    Education Details  technique with therex; HEP updates and increase in frequency    Person(s) Educated  Patient    Methods  Explanation;Verbal cues    Comprehension  Verbalized understanding;Returned demonstration       PT Short Term Goals - 07/21/18 1317      PT SHORT TERM GOAL #1   Title  Pt will demo consistency and independence with her initial HEP to improve flexibility and decrease pain.    Time  4    Period  Weeks    Status  Achieved      PT SHORT TERM GOAL #2   Title  Pt will report atleast 30% reduction in her pain from the start of PT which will promote increased participation in activity in the community.     Time  4    Period  Weeks    Status  New      PT SHORT TERM GOAL #3   Title  Pt will demo improved ankle ROM to atleast 10 deg DF and 45 deg PF which will improve her mechanics with ambulation.    Time  4    Period  Weeks    Status  New        PT Long Term Goals - 06/30/18  1317      PT LONG TERM GOAL #1   Title  Pt will demo improved gastroc strength evident by her ability to complete atleast 15 single leg heel raises on each LE without difficulty or increase in pain.     Time  8    Period  Weeks    Status  New    Target Date  08/31/18      PT LONG TERM GOAL #2   Title  Pt will report atleast 60% reduction in her pain from the start of PT which will increase her ability to go grocery shopping without limitation.     Time  8    Period  Weeks    Status  New      PT LONG TERM GOAL #3   Title  Pt will demonstrate improved single leg proprioception evident by her ability to maintain single leg balance on stable surface up to 15 sec, 2/3 trials without LOB.    Time  8    Period  Weeks    Status  New    Target Date  08/31/18      PT LONG TERM GOAL #4   Title  Pt will be able to ascend and descend atleast 4, 6" steps with reciprocal pattern and without increase in Rt heel pain, 2/3 trials.     Time  8    Period  Weeks    Status  New      PT LONG TERM GOAL #5   Title  Pt will ambulate atleast 142ft indoors with symmetrical step length, push off and without reports of increased pain.     Time  8    Period  Weeks    Status  New            Plan - 07/21/18 1228    Clinical Impression Statement  Pt demonstrates improved ankle mobility, evident by increase in Rt ankle DF to 10 deg with knee bent. She demonstrates good understanding of HEP and this was updated to further address limitations in ankle ROM, strength and flexibility. Pt does require supervision during her sessions to ensure she feels comfortable  with exercise technique and positioning. She noted no increase in heel pain end of session and verbalized understanding of HEP updates.     Rehab Potential  Good    PT Frequency  2x / week    PT Duration  8 weeks    PT Treatment/Interventions  ADLs/Self Care Home Management;Moist Heat;Cryotherapy;Iontophoresis 4mg /ml Dexamethasone;Therapeutic  exercise;Patient/family education;Manual techniques;Passive range of motion;Therapeutic activities;Neuromuscular re-education;Functional mobility training;Stair training;Balance training;Dry needling;Taping    PT Next Visit Plan   ankle mobilizations and soft tissue work to improve DF/PF; ankle green band increase to blue if needed; gentle ankle proprioception    PT Home Exercise Plan  Access Code: [place]       Patient will benefit from skilled therapeutic intervention in order to improve the following deficits and impairments:  Abnormal gait, Decreased balance, Decreased endurance, Decreased mobility, Difficulty walking, Hypomobility, Increased muscle spasms, Improper body mechanics, Increased edema, Decreased range of motion, Decreased safety awareness, Decreased strength, Impaired flexibility, Postural dysfunction, Pain, Decreased activity tolerance  Visit Diagnosis: Pain in right ankle and joints of right foot  Stiffness of right ankle, not elsewhere classified  Other abnormalities of gait and mobility  Muscle weakness (generalized)     Problem List Patient Active Problem List   Diagnosis Date Noted  . Lumbar spinal stenosis 12/19/2017  . Pain in joint, ankle and foot 05/02/2016  . Chronic venous insufficiency 05/02/2016  . Peripheral edema 11/01/2015  . Intractable chronic migraine without aura 11/02/2014  . Tremor 04/13/2014  . Migraine without aura 04/13/2014  . Pituitary microadenoma ([place]) 04/13/2014    1:19 PM,07/21/18 Sherol Dade PT, DPT [place] [facility] [address] [place], [place], [address] Phone: [phone]   Fax:  [phone]  Name: Kristin Gilmore MRN: [id number] Date of Birth: [birth year]

## 2018-07-23 ENCOUNTER — Ambulatory Visit: Payer: Medicare Other | Admitting: Physical Therapy

## 2018-07-28 ENCOUNTER — Ambulatory Visit: Payer: Medicare Other | Admitting: Physical Therapy

## 2018-07-28 ENCOUNTER — Encounter: Payer: Self-pay | Admitting: Physical Therapy

## 2018-07-28 DIAGNOSIS — M25571 Pain in right ankle and joints of right foot: Secondary | ICD-10-CM | POA: Diagnosis not present

## 2018-07-28 DIAGNOSIS — M25671 Stiffness of right ankle, not elsewhere classified: Secondary | ICD-10-CM

## 2018-07-28 DIAGNOSIS — R2689 Other abnormalities of gait and mobility: Secondary | ICD-10-CM

## 2018-07-28 DIAGNOSIS — M6281 Muscle weakness (generalized): Secondary | ICD-10-CM

## 2018-07-28 NOTE — Therapy (Signed)
[facility] [address], [place], [place], [address] Phone: [phone]   Fax:  [phone]  Physical Therapy Treatment/Discharge Summary   Patient Details  Name: Kristin Gilmore MRN: [id number] Date of Birth: Nov 07, 1950 Referring Provider (PT): Vickki Hearing, MD    Encounter Date: 07/28/2018  PT End of Session - 07/28/18 1136    Visit Number  7    Date for PT Re-Evaluation  08/31/18    Authorization Type  Medicare A and B     Authorization - Visit Number  7    Authorization - Number of Visits  10    PT Start Time  1100    PT Stop Time  1135    PT Time Calculation (min)  35 min    Activity Tolerance  Patient limited by pain       Past Medical History:  Diagnosis Date  . Arthritis   . Depression   . Dyslipidemia   . Hypertension   . Migraine   . Migraine without aura, without mention of intractable migraine without mention of status migrainosus 04/13/2014  . Pituitary microadenoma ([place]) 04/13/2014  . Pre-diabetes    "i was told i was pre-diabetic a while ago"  . Tremor 04/13/2014    Past Surgical History:  Procedure Laterality Date  . CHOLECYSTECTOMY    . COLONOSCOPY W/ POLYPECTOMY    . LUMBAR LAMINECTOMY/DECOMPRESSION MICRODISCECTOMY N/A 12/19/2017   Procedure: LAMINECTOMY LUMBAR TWO- LUMBAR THREE, LUMBAR THREE- LUMBAR FOUR, LUMBAR FOUR- LUMBAR FIVE ;  Surgeon: Consuella Lose, MD;  Location: [place];  Service: Neurosurgery;  Laterality: N/A;  . NASAL SINUS SURGERY    . TONSILLECTOMY    . WISDOM TOOTH EXTRACTION      There were no vitals filed for this visit.  Subjective Assessment - 07/28/18 1104    Subjective  Nothing is helping.  I feel like I"m the same as when I came here.  Pain in Achilles region.  Nothing you've been doing is helping.  I'm spinning my wheels.      Pertinent History  Lx surgery 12/19/2017; high blood pressure (324-401U diastolic BP)    Currently in Pain?  Yes    Pain Score   9     Pain Location  Ankle    Pain Orientation  Right         [facility] PT Assessment - 07/28/18 0001      AROM   Right Ankle Dorsiflexion  10   with knee slightly bent   Right Ankle Plantar Flexion  45      Strength   Overall Strength Comments  Pt unable to complete single leg heel raise on Rt     Right Ankle Dorsiflexion  4/5    Right Ankle Inversion  4/5    Right Ankle Eversion  4/5                   [facility] Adult PT Treatment/Exercise - 07/28/18 0001      Self-Care   Self-Care  --   discussed continuation of HEP and ice     Vasopneumatic   Number Minutes Vasopneumatic   12 minutes    Vasopnuematic Location   Ankle    Vasopneumatic Pressure  Low    Vasopneumatic Temperature   34      Manual Therapy   Joint Mobilization  Grade III AP and PA talocrural joint mobilization x3 bouts each direction    Soft tissue mobilization  STM  Rt gastroc and soleus               PT Short Term Goals - 07/28/18 2047      PT SHORT TERM GOAL #1   Title  Pt will demo consistency and independence with her initial HEP to improve flexibility and decrease pain.    Status  Achieved      PT SHORT TERM GOAL #2   Title  Pt will report atleast 30% reduction in her pain from the start of PT which will promote increased participation in activity in the community.     Status  Not Met      PT SHORT TERM GOAL #3   Title  Pt will demo improved ankle ROM to atleast 10 deg DF and 45 deg PF which will improve her mechanics with ambulation.    Status  Not Met        PT Long Term Goals - 07/28/18 2047      PT LONG TERM GOAL #1   Title  Pt will demo improved gastroc strength evident by her ability to complete atleast 15 single leg heel raises on each LE without difficulty or increase in pain.     Status  Not Met      PT LONG TERM GOAL #2   Title  Pt will report atleast 60% reduction in her pain from the start of PT which will increase her ability to go grocery shopping without  limitation.     Status  Not Met      PT LONG TERM GOAL #3   Title  Pt will demonstrate improved single leg proprioception evident by her ability to maintain single leg balance on stable surface up to 15 sec, 2/3 trials without LOB.    Status  Not Met      PT LONG TERM GOAL #4   Title  Pt will be able to ascend and descend atleast 4, 6" steps with reciprocal pattern and without increase in Rt heel pain, 2/3 trials.     Status  Not Met      PT LONG TERM GOAL #5   Title  Pt will ambulate atleast 145f indoors with symmetrical step length, push off and without reports of increased pain.     Status  Not Met            Plan - 07/28/18 2023    Clinical Impression Statement  The patient reports no improvement in pain or function since starting PT.  Numerous interventions used including therapeutic exercises (proprioceptions, ROM and strengthening), manual therapy including joint mobilizations and soft tissue mobilizations, iontophoresis with dexamethasone;  kinesiotaping and vascompression cryotherapy.  She continues to be very painful with all weight bearing and has increased thickness along Achilles tendon.  Recommend she follow up with her MD and will discharge from PT at this time secondary to lack of progress.      Rehab Potential  Good    PT Frequency  2x / week    PT Duration  8 weeks    PT Treatment/Interventions  ADLs/Self Care Home Management;Moist Heat;Cryotherapy;Iontophoresis 48mml Dexamethasone;Therapeutic exercise;Patient/family education;Manual techniques;Passive range of motion;Therapeutic activities;Neuromuscular re-education;Functional mobility training;Stair training;Balance training;Dry needling;Taping    PT Next Visit Plan   ankle mobilizations and soft tissue work to improve DF/PF; ankle green band increase to blue if needed; gentle ankle proprioception    PT Home Exercise Plan  Access Code: [place]     Patient will benefit from skilled therapeutic  intervention in  order to improve the following deficits and impairments:  Abnormal gait, Decreased balance, Decreased endurance, Decreased mobility, Difficulty walking, Hypomobility, Increased muscle spasms, Improper body mechanics, Increased edema, Decreased range of motion, Decreased safety awareness, Decreased strength, Impaired flexibility, Postural dysfunction, Pain, Decreased activity tolerance  Visit Diagnosis: Pain in right ankle and joints of right foot  Stiffness of right ankle, not elsewhere classified  Other abnormalities of gait and mobility  Muscle weakness (generalized)     PHYSICAL THERAPY DISCHARGE SUMMARY  Visits from Start of Care: 7 Current functional level related to goals / functional outcomes: See clinical impressions above   Remaining deficits: As above   Education / Equipment: HEP Plan: Patient agrees to discharge.  Patient goals were not met. Patient is being discharged due to lack of progress.  ?????         Problem List Patient Active Problem List   Diagnosis Date Noted  . Lumbar spinal stenosis 12/19/2017  . Pain in joint, ankle and foot 05/02/2016  . Chronic venous insufficiency 05/02/2016  . Peripheral edema 11/01/2015  . Intractable chronic migraine without aura 11/02/2014  . Tremor 04/13/2014  . Migraine without aura 04/13/2014  . Pituitary microadenoma ([place]) 04/13/2014   Ruben Im, PT 07/28/18 8:50 PM Phone: [phone] Fax: [phone]  Alvera Singh 07/28/2018, 8:49 PM  [facility] [address] [place], [place], [address] Phone: [phone]   Fax:  [phone]  Name: Kristin Gilmore MRN: [id number] Date of Birth: May 21, 1951

## 2018-07-29 ENCOUNTER — Ambulatory Visit: Payer: Self-pay | Admitting: Psychiatry

## 2018-07-30 ENCOUNTER — Ambulatory Visit: Payer: Medicare Other | Admitting: Physical Therapy

## 2018-08-04 ENCOUNTER — Encounter: Payer: Medicare Other | Admitting: Physical Therapy

## 2018-08-06 ENCOUNTER — Encounter: Payer: Medicare Other | Admitting: Physical Therapy

## 2018-08-25 ENCOUNTER — Ambulatory Visit
Admission: RE | Admit: 2018-08-25 | Discharge: 2018-08-25 | Disposition: A | Discharge status: Home / Self Care | Payer: Medicare Other | Source: Ambulatory Visit | Attending: Orthopedic Surgery | Admitting: Orthopedic Surgery

## 2018-08-25 ENCOUNTER — Other Ambulatory Visit: Payer: Self-pay | Admitting: Orthopedic Surgery

## 2018-08-25 DIAGNOSIS — R52 Pain, unspecified: Secondary | ICD-10-CM

## 2018-08-25 DIAGNOSIS — M25551 Pain in right hip: Secondary | ICD-10-CM

## 2018-08-25 IMAGING — CT CT PELVIS W/O CM
2 series · 13 of 32 positions shown, 19 images · non-contrast
Comparison: CT abdomen pelvis 05/31/2015.

CLINICAL DATA: Right pelvic pain since a fall off a stool 1 week
ago. Initial encounter.

EXAM:
CT OF THE PELVIS WITHOUT CONTRAST
TECHNIQUE: Multidetector CT imaging of the pelvis was performed according to
the standard protocol.

[Series 2: soft tissue pelvis without · axial · non-contrast · 0.80mm/px · z∈[-346,-121]mm · 10 of 57 slices shown, 16 images]
[im 6/57  soft-tissue]
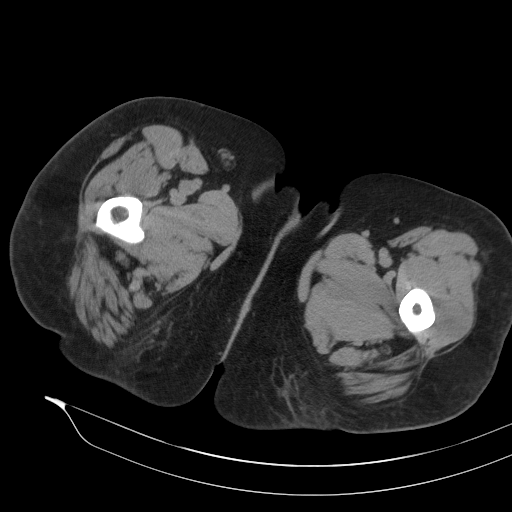
[im 6/57  bone]
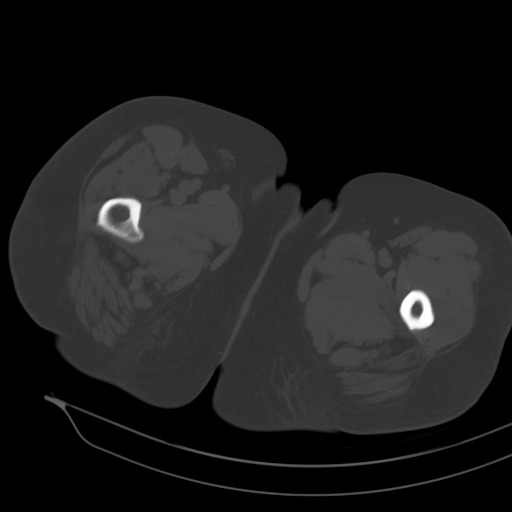
[im 11/57  soft-tissue]
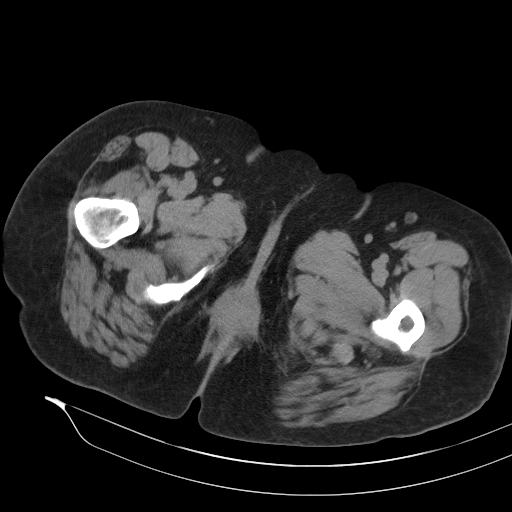
[im 16/57  soft-tissue]
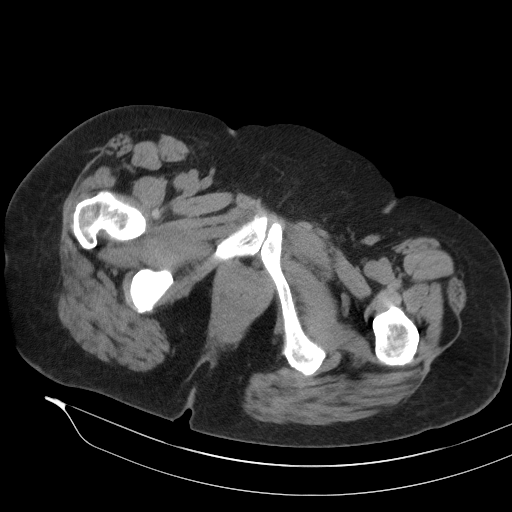
[im 21/57  soft-tissue]
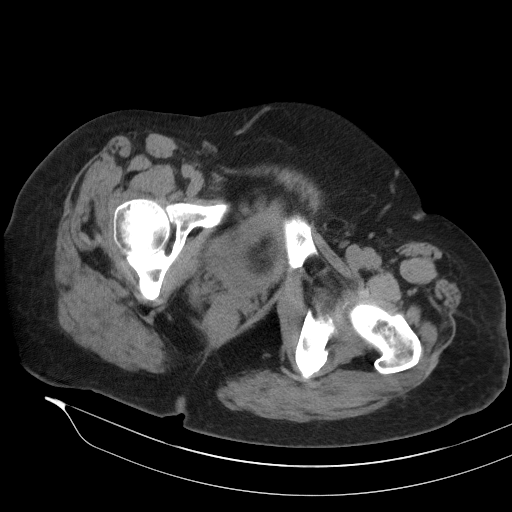
[im 26/57  soft-tissue]
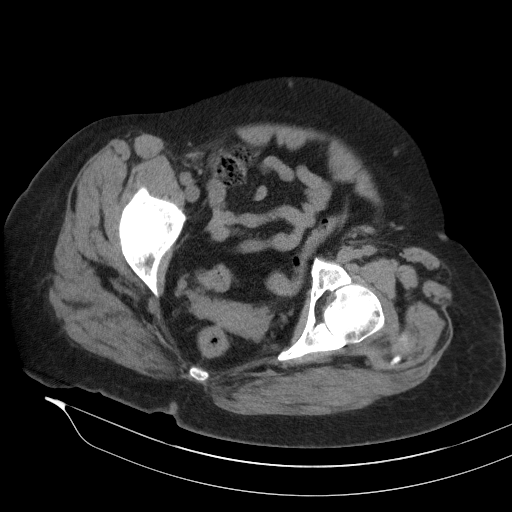
[im 31/57  soft-tissue]
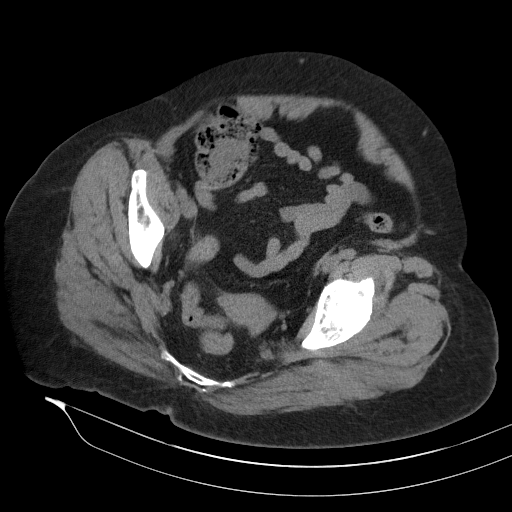
[im 36/57  soft-tissue]
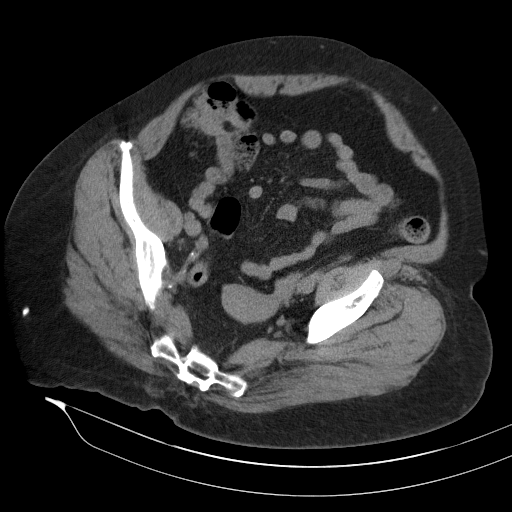
[im 36/57  lung]
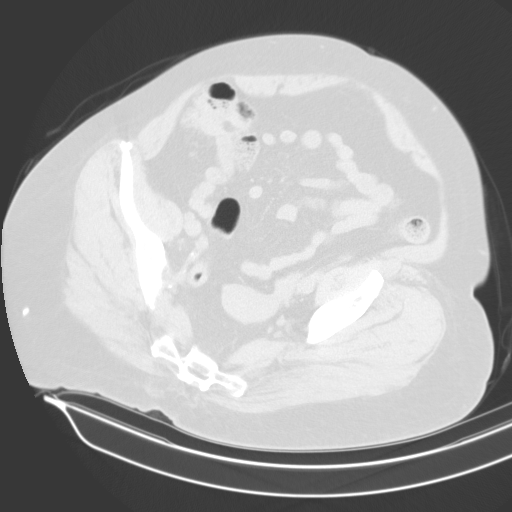
[im 41/57  soft-tissue]
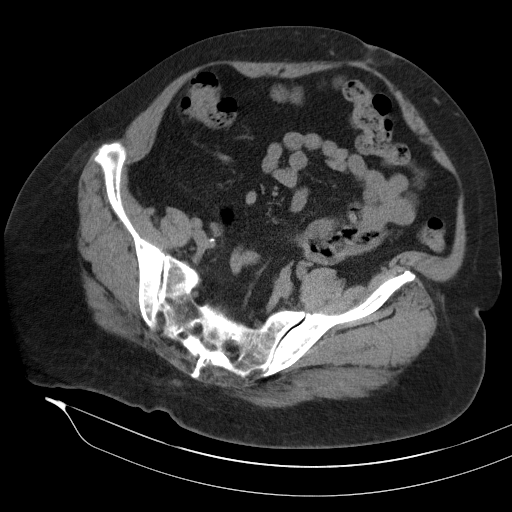
[im 41/57  lung]
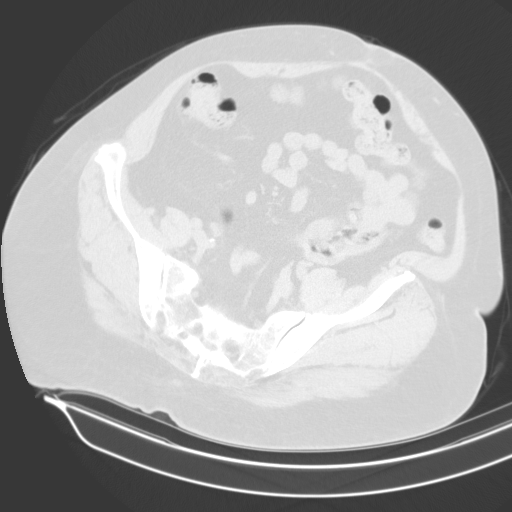
[im 46/57  soft-tissue]
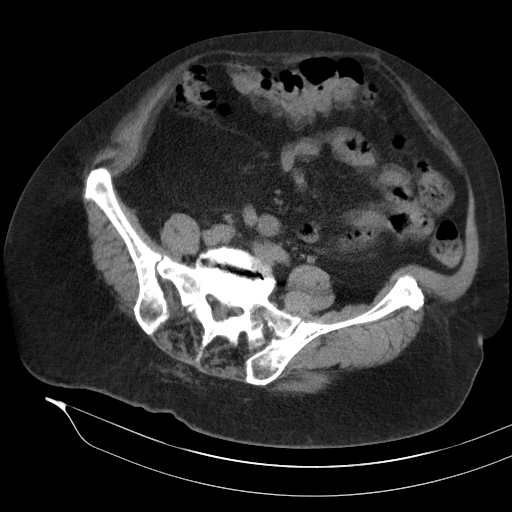
[im 46/57  lung]
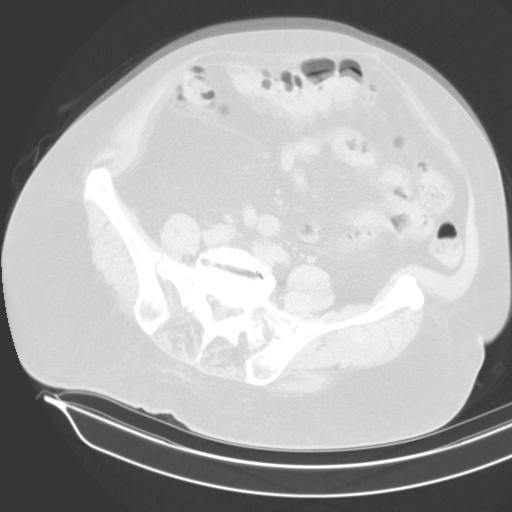
[im 46/57  bone]
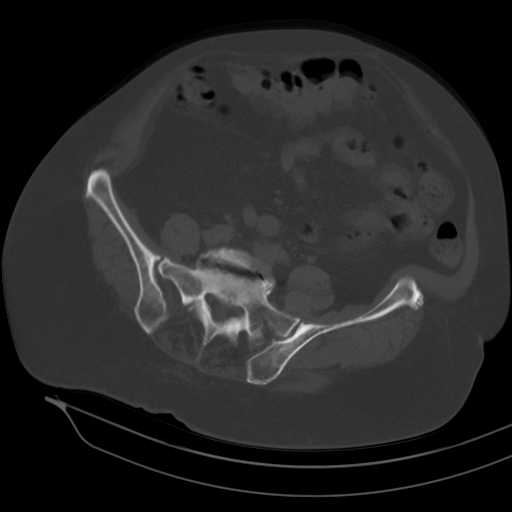
[im 51/57  soft-tissue]
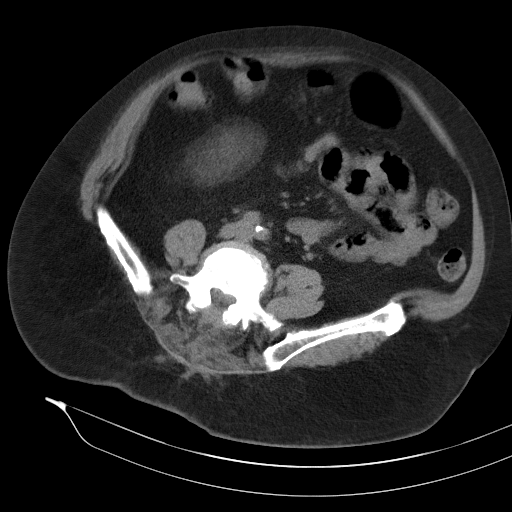
[im 51/57  lung]
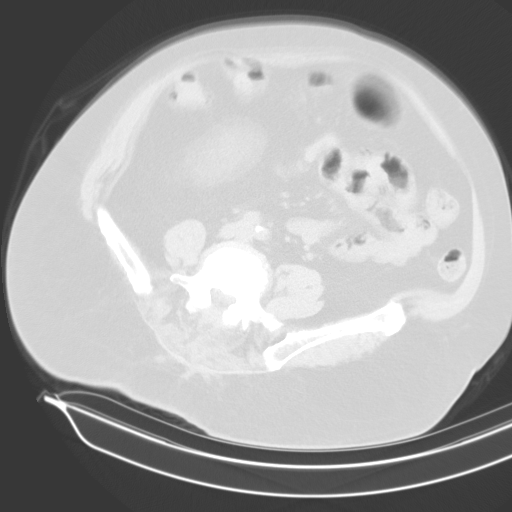

[Series 3: bone · axial · 0.80mm/px · z∈[-346,-286]mm · 3 of 95 slices shown]
[im 10/95  bone]
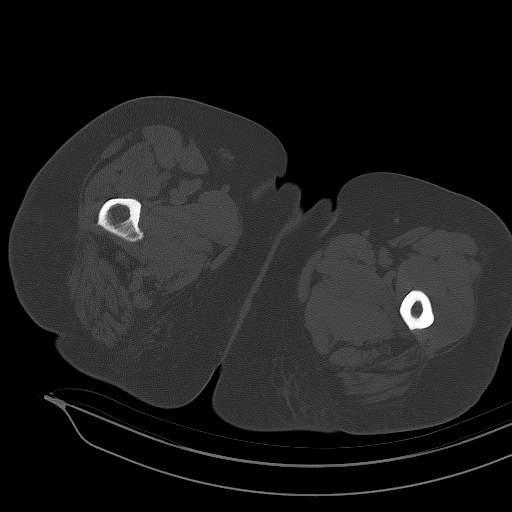
[im 20/95  bone]
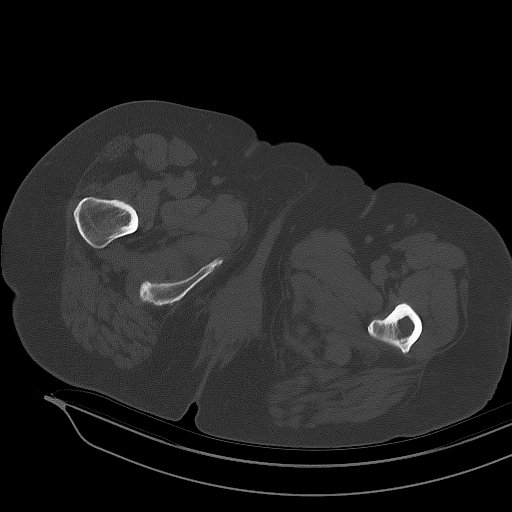
[im 30/95  bone]
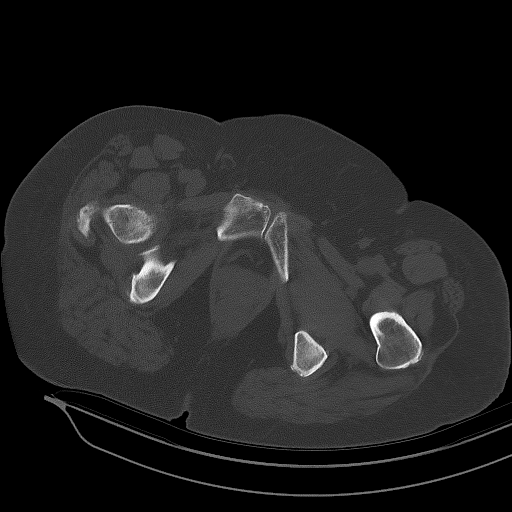

[13 of 32 positions shown; findings below may reference images not displayed]

FINDINGS: Bones/Joint/Cartilage

There is no acute bony or joint abnormality. No worrisome bony
lesion is identified. No avascular necrosis of the femoral heads.
There is partial visualization convex left lumbar scoliosis. Loss of
disc space height and vacuum disc phenomenon are seen at L4-5 and
L5-S1. There is facet degenerative change at these levels. The
patient is status post L4-5 posterior decompression. Hip joint
spaces are preserved. No joint effusion is identified.

Ligaments

Suboptimally assessed by CT.

Muscles and Tendons

Appear normal.

Soft tissues

Atherosclerosis is noted. The patient has a small fat containing
ventral hernia.
IMPRESSION: No acute abnormality.

Lower lumbar degenerative disease.

Atherosclerosis.

## 2018-08-25 IMAGING — CT CT KNEE*R* W/O CM
1 series · 12 of 14 positions shown, 15 images · non-contrast
Comparison: None.

CLINICAL DATA: Right back pain extending into the pelvis and right
knee after falling off of a stool 1 week ago.

EXAM:
CT OF THE RIGHT KNEE WITHOUT CONTRAST
TECHNIQUE: Multidetector CT imaging of the right knee was performed according
to the standard protocol. Multiplanar CT image reconstructions were
also generated.

[Series 8: knee soft · axial · 0.41mm/px · z∈[-200,-71]mm · 12 of 51 slices shown, 15 images]
[im 4/51  soft-tissue]
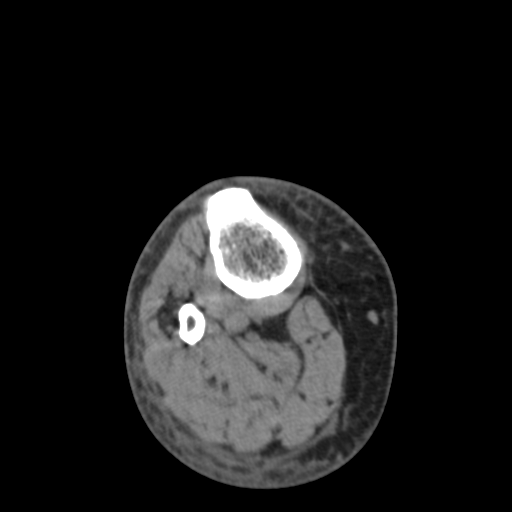
[im 4/51  bone]
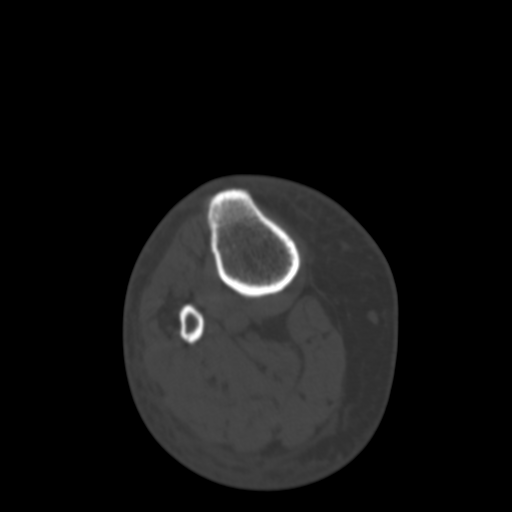
[im 8/51  bone]
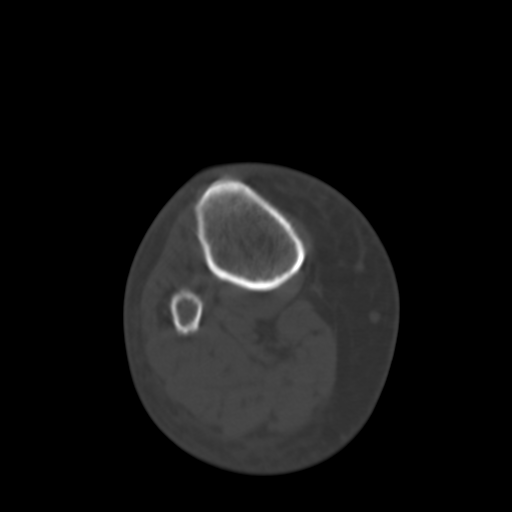
[im 12/51  bone]
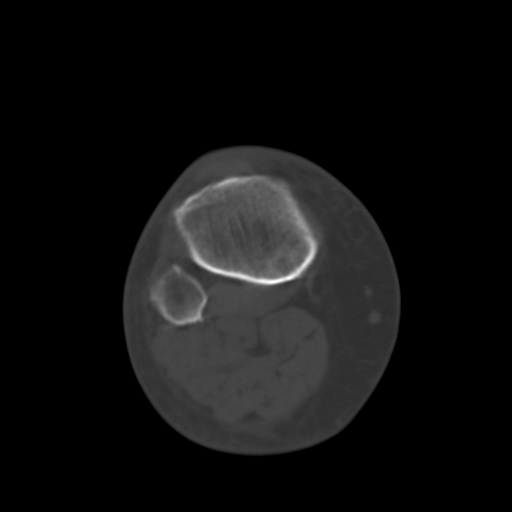
[im 16/51  bone]
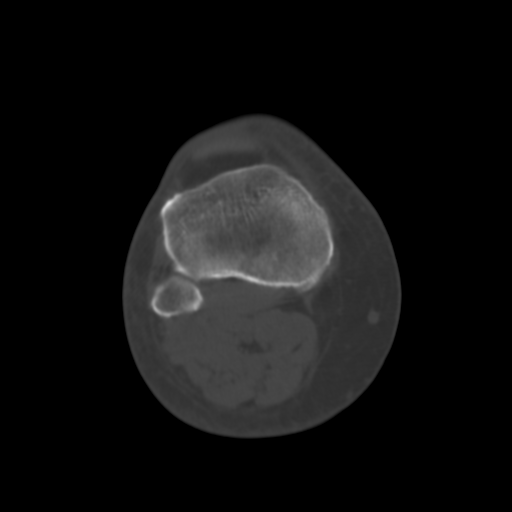
[im 20/51  soft-tissue]
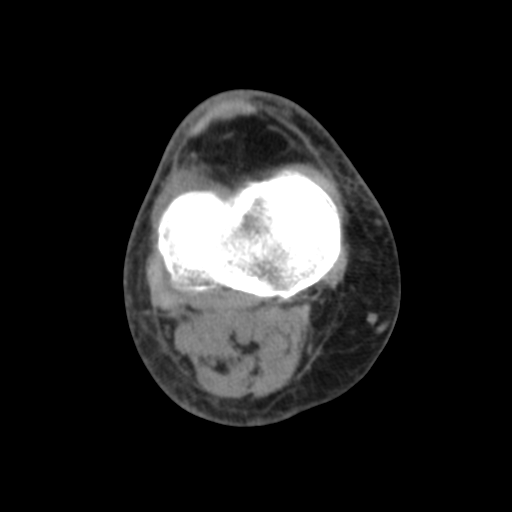
[im 20/51  bone]
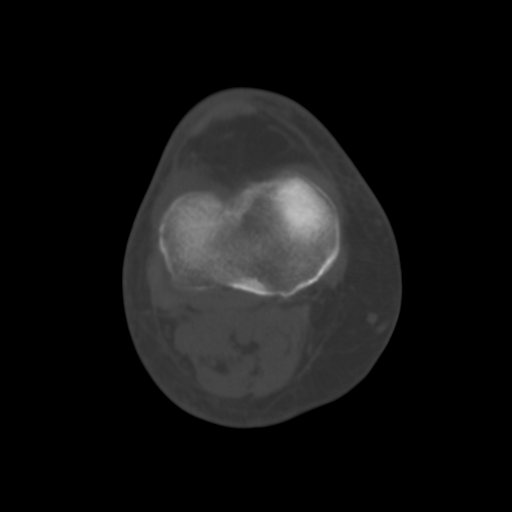
[im 24/51  bone]
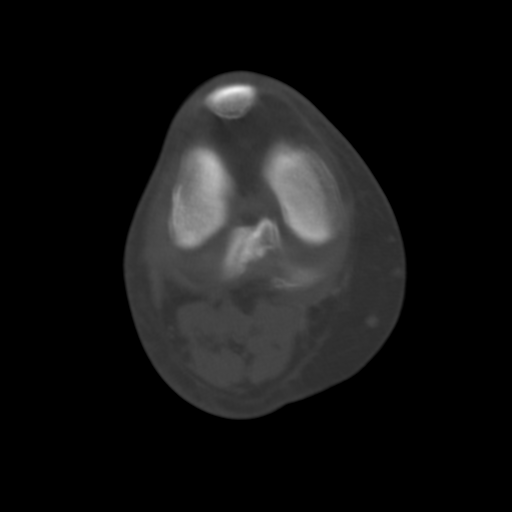
[im 27/51  bone]
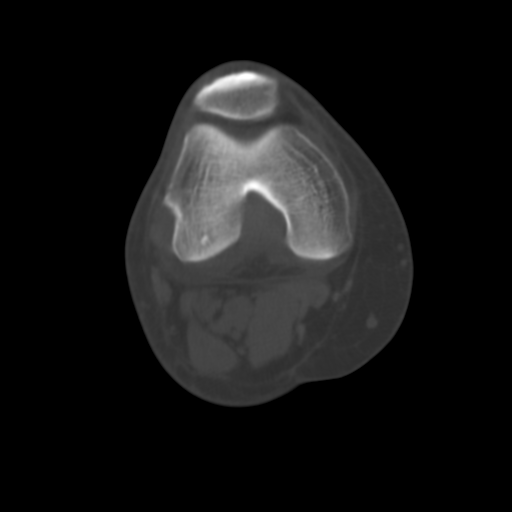
[im 31/51  bone]
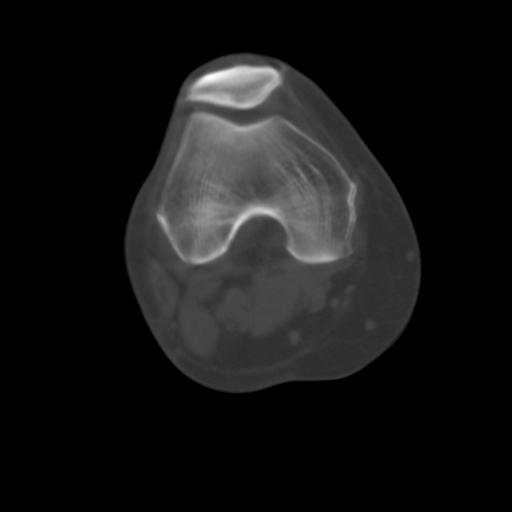
[im 35/51  soft-tissue]
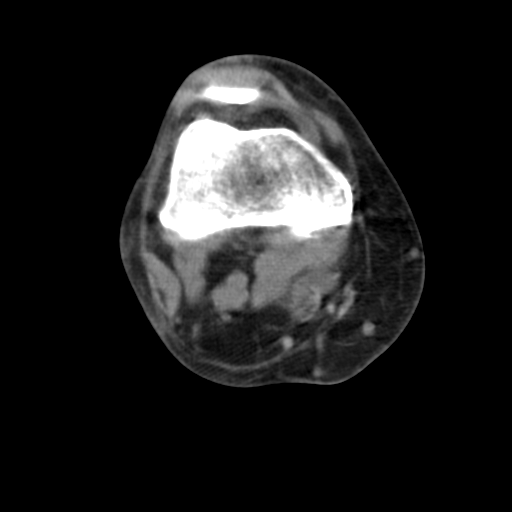
[im 35/51  bone]
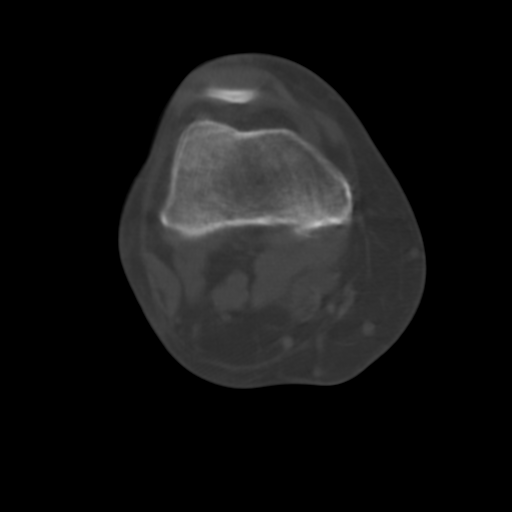
[im 39/51  bone]
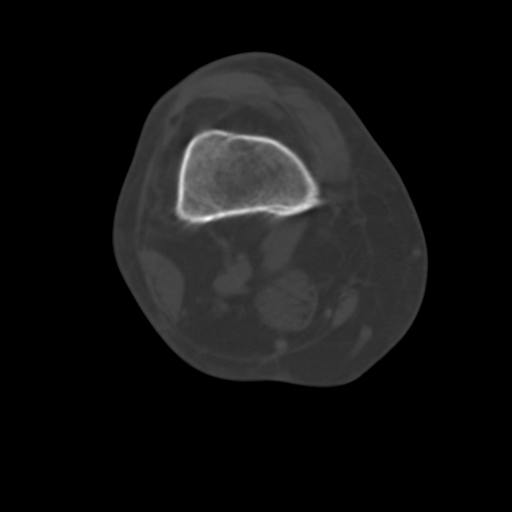
[im 43/51  bone]
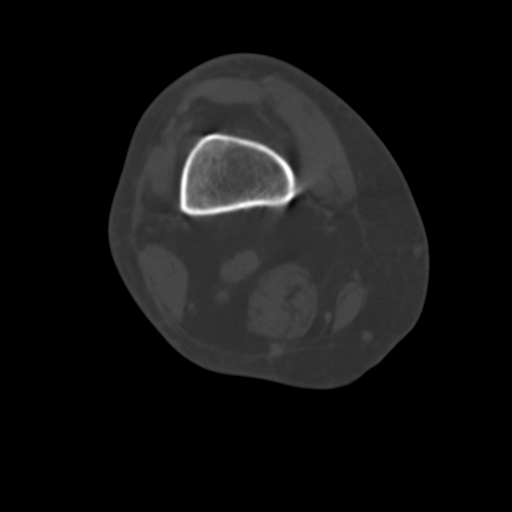
[im 47/51  bone]
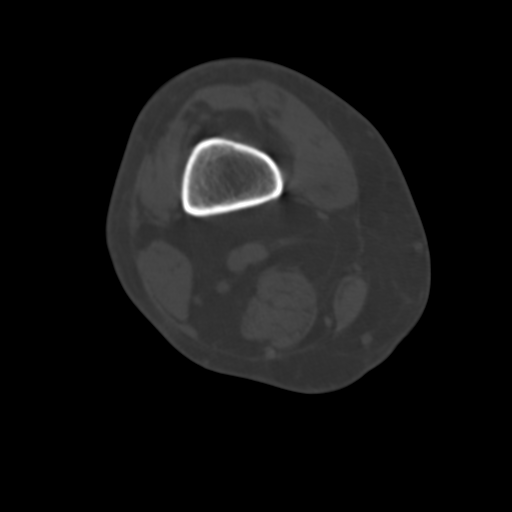

[12 of 14 positions shown; findings below may reference images not displayed]

FINDINGS: Bones/Joint/Cartilage

The study is mildly motion degraded. There is no evidence of acute
fracture or dislocation. There is mild lateral joint space narrowing
with osteophyte formation. No significant joint effusion.

Ligaments

Suboptimally assessed by CT.  The cruciate ligaments appear intact.

Muscles and Tendons

The extensor mechanism is intact. No muscular abnormalities are
identified.

Soft tissues

Tiny Baker's cyst. There is some soft tissue edema posterolaterally
without other focal fluid collection.
IMPRESSION: 1. Subcutaneous edema posterolaterally, possibly due to soft tissue
contusion. No focal hematoma identified.
2. No evidence of acute fracture or dislocation. Bone detail is
mildly limited by motion artifact.
3. Mild lateral compartment degenerative changes. No significant
joint effusion.

## 2018-09-03 ENCOUNTER — Other Ambulatory Visit (HOSPITAL_COMMUNITY): Payer: Self-pay | Admitting: Specialist

## 2018-09-03 DIAGNOSIS — M47896 Other spondylosis, lumbar region: Secondary | ICD-10-CM

## 2018-09-04 ENCOUNTER — Ambulatory Visit: Payer: Medicare Other | Admitting: Neurology

## 2018-09-07 ENCOUNTER — Ambulatory Visit: Payer: Medicare Other | Admitting: Neurology

## 2018-09-09 ENCOUNTER — Ambulatory Visit (HOSPITAL_COMMUNITY): Admission: RE | Admit: 2018-09-09 | Payer: Medicare Other | Source: Ambulatory Visit

## 2018-09-14 ENCOUNTER — Other Ambulatory Visit (HOSPITAL_COMMUNITY): Payer: Self-pay | Admitting: Specialist

## 2018-09-14 ENCOUNTER — Ambulatory Visit (HOSPITAL_COMMUNITY)
Admission: RE | Admit: 2018-09-14 | Discharge: 2018-09-14 | Disposition: A | Discharge status: Home / Self Care | Payer: Medicare Other | Source: Ambulatory Visit | Attending: Specialist | Admitting: Specialist

## 2018-09-14 ENCOUNTER — Encounter (HOSPITAL_COMMUNITY): Payer: Self-pay

## 2018-09-14 ENCOUNTER — Encounter: Payer: Self-pay | Admitting: Physician Assistant

## 2018-09-14 DIAGNOSIS — M47896 Other spondylosis, lumbar region: Secondary | ICD-10-CM | POA: Insufficient documentation

## 2018-09-16 ENCOUNTER — Other Ambulatory Visit: Payer: Self-pay | Admitting: Specialist

## 2018-09-16 DIAGNOSIS — M47896 Other spondylosis, lumbar region: Secondary | ICD-10-CM

## 2018-10-22 ENCOUNTER — Ambulatory Visit: Payer: Medicare Other | Admitting: Neurology

## 2018-10-22 ENCOUNTER — Encounter

## 2018-10-27 ENCOUNTER — Encounter: Payer: Self-pay | Admitting: Podiatry

## 2018-10-27 ENCOUNTER — Ambulatory Visit (INDEPENDENT_AMBULATORY_CARE_PROVIDER_SITE_OTHER): Payer: Medicare Other | Admitting: Podiatry

## 2018-10-27 DIAGNOSIS — L603 Nail dystrophy: Secondary | ICD-10-CM | POA: Diagnosis not present

## 2018-10-27 DIAGNOSIS — B351 Tinea unguium: Secondary | ICD-10-CM

## 2018-10-27 DIAGNOSIS — M79675 Pain in left toe(s): Secondary | ICD-10-CM | POA: Diagnosis not present

## 2018-10-27 NOTE — Progress Notes (Signed)
This patient presents to the office for an evaluation of her long thick nails on her toes left foot.  She says she is concerned about the thickness of the toenails and believes she may have a fungal infection in her toenails.  She has surgery performed by myself previously and has healed uneventfully.  She says she had her nails cut today before this visit.  She presents to the office to discuss her nails.  General Appearance  Alert, conversant and in no acute stress.  Vascular  Dorsalis pedis and posterior tibial  pulses are palpable  bilaterally.  Capillary return is within normal limits  bilaterally. Temperature is within normal limits  bilaterally.  Neurologic  Senn-Weinstein monofilament wire test within normal limits  bilaterally. Muscle power within normal limits bilaterally.  Nails Thickened and yellow nails left foot.  No evidence of bacterial infection or drainage bilaterally.  Orthopedic  No limitations of motion  feet .  No crepitus or effusions noted.  No bony pathology or digital deformities noted.  Skin  normotropic skin with no porokeratosis noted bilaterally.  No signs of infections or ulcers noted.    Dystropic Nails  1-5  Left.  ROV  Debride nails 1-5 left foot.  Discussed this condition with this patient.  Told her to use fungal/urea cream on her nails.  RTC 3 months   Gardiner Barefoot DPM

## 2018-11-09 ENCOUNTER — Encounter: Payer: Self-pay | Admitting: Physical Therapy

## 2018-11-09 ENCOUNTER — Other Ambulatory Visit: Payer: Self-pay

## 2018-11-09 ENCOUNTER — Ambulatory Visit: Payer: Medicare Other | Attending: Orthopedic Surgery | Admitting: Physical Therapy

## 2018-11-09 DIAGNOSIS — M25671 Stiffness of right ankle, not elsewhere classified: Secondary | ICD-10-CM

## 2018-11-09 DIAGNOSIS — M6281 Muscle weakness (generalized): Secondary | ICD-10-CM | POA: Diagnosis present

## 2018-11-09 DIAGNOSIS — R2689 Other abnormalities of gait and mobility: Secondary | ICD-10-CM | POA: Diagnosis present

## 2018-11-09 DIAGNOSIS — M25571 Pain in right ankle and joints of right foot: Secondary | ICD-10-CM | POA: Diagnosis not present

## 2018-11-09 NOTE — Patient Instructions (Signed)
Access Code: [email or web address]  URL: [email or web address]  Date: 11/09/2018  Prepared by: Venetia Night Beuhring   Exercises  Gastroc Stretch on Wall - 3 reps - 3 sets - 30 hold - 1x daily - 7x weekly  Seated Ankle Circles - 10 reps - 3 sets - 1x daily - 7x weekly  Seated Heel Toe Raises - 10 reps - 3 sets - 1x daily - 7x weekly  Seated Ankle Alphabet - 10 reps - 3 sets - 1x daily - 7x weekly

## 2018-11-09 NOTE — Therapy (Signed)
[facility] [address] [place], [place], [address] Phone: [phone]   Fax:  [phone]  Physical Therapy Evaluation  Patient Details  Name: Zailyn Thoennes Zane MRN: [id number] Date of Birth: January 26, 1951 Referring Provider (PT): Wylene Simmer, MD   Encounter Date: 11/09/2018  PT End of Session - 11/09/18 2016    Visit Number  1    Date for PT Re-Evaluation  01/04/19    Authorization Type  Medicare BCBS    PT Start Time  4580    PT Stop Time  1615    PT Time Calculation (min)  45 min    Activity Tolerance  Patient tolerated treatment well    Behavior During Therapy  [facility] for tasks assessed/performed       Past Medical History:  Diagnosis Date  . Arthritis   . Depression   . Dyslipidemia   . Hypertension   . Migraine   . Migraine without aura, without mention of intractable migraine without mention of status migrainosus 04/13/2014  . Pituitary microadenoma ([place]) 04/13/2014  . Pre-diabetes    "i was told i was pre-diabetic a while ago"  . Tremor 04/13/2014    Past Surgical History:  Procedure Laterality Date  . CHOLECYSTECTOMY    . COLONOSCOPY W/ POLYPECTOMY    . LUMBAR LAMINECTOMY/DECOMPRESSION MICRODISCECTOMY N/A 12/19/2017   Procedure: LAMINECTOMY LUMBAR TWO- LUMBAR THREE, LUMBAR THREE- LUMBAR FOUR, LUMBAR FOUR- LUMBAR FIVE ;  Surgeon: Consuella Lose, MD;  Location: [facility];  Service: Neurosurgery;  Laterality: N/A;  . NASAL SINUS SURGERY    . TONSILLECTOMY    . WISDOM TOOTH EXTRACTION      There were no vitals filed for this visit.   Subjective Assessment - 11/09/18 1537    Subjective  Pt ruptured Rt achilles tendon approx 12 weeks ago.  Pt was casted vs surgical treatment x 7 weeks non-WB followed by 4 weeks in WB boot.  Pt performed ankle exercises outside of boot during those 4 weeks in boot.  Heel lifts have all been removed and Pt is cleared for full WB.    Pertinent History  recent fall caused  pain in Lt low back with HNP in L5/S1    Limitations  Walking   using two wheel walker for community distances for now   How long can you walk comfortably?  5 min, uses walker for community distances, takes short breaks and then is active again    Patient Stated Goals  [facility], walk dog in neighborhood without pain/stiffness/tightenss    Currently in Pain?  No/denies         [facility] PT Assessment - 11/09/18 0001      Assessment   Medical Diagnosis  S86.091A (ICD-10-CM) - Other specified injury of right achilles tendon, initial encounter    Referring Provider (PT)  Wylene Simmer, MD    Onset Date/Surgical Date  --   approx 12 weeks ago   Next MD Visit  no    Prior Therapy  no      Precautions   Precautions  None      Restrictions   Weight Bearing Restrictions  No      Balance Screen   Has the patient fallen in the past 6 months  Yes    How many times?  1    Has the patient had a decrease in activity level because of a fear of falling?   No    Is the patient  reluctant to leave their home because of a fear of falling?   No      Home Environment   Living Environment  Private residence    Living Arrangements  Spouse/significant other    Type of [place] Access  Stairs to enter    Entrance Stairs-Number of Steps  2    Entrance Stairs-Rails  Right;Left    Home Layout  Two level    Alternate Level Stairs-Number of Steps  10   has chair lift   Home Equipment  Walker - 2 wheels;Grab bars - tub/shower;Grab bars - toilet;Cane - single point      Prior Function   Level of Independence  Independent    Vocation  Retired    Garment/textile technologist, crochet, knit, walk dog      Cognition   Overall Cognitive Status  Within Functional Limits for tasks assessed      Observation/Other Assessments   Focus on Therapeutic Outcomes (FOTO)   56%   goal 44%     Observation/Other Assessments-Edema    Edema  Figure 8      Figure 8 Edema   Figure 8 - Right   51.5     Figure 8 - Left   51      ROM / Strength   AROM / PROM / Strength  AROM;Strength      AROM   AROM Assessment Site  Ankle    Right/Left Ankle  Right;Left    Right Ankle Dorsiflexion  5    Right Ankle Plantar Flexion  10    Right Ankle Inversion  12    Right Ankle Eversion  10    Left Ankle Dorsiflexion  6    Left Ankle Plantar Flexion  30    Left Ankle Inversion  16    Left Ankle Eversion  18      Strength   Overall Strength  --    Overall Strength Comments  Rt ankle 4/5 all planes, Lt ankle 4/5 all planes, Lt leg history of polio      Flexibility   Soft Tissue Assessment /Muscle Length  --      Ambulation/Gait   Ambulation/Gait  Yes    Ambulation/Gait Assistance  6: Modified independent (Device/Increase time)    Ambulation Distance (Feet)  80 Feet    Assistive device  Rolling walker    Gait Pattern  Step-through pattern;Decreased step length - left;Decreased stance time - right    Ambulation Surface  Level    Gait Comments  Pt ambulated independently without AD for short distances in clinic without difficulty                Objective measurements completed on examination: See above findings.              PT Education - 11/09/18 1611    Education Details  Access Code: [email or web address]     Person(s) Educated  Patient    Methods  Explanation;Demonstration;Verbal cues;Handout    Comprehension  Verbalized understanding;Returned demonstration       PT Short Term Goals - 11/09/18 2024      PT SHORT TERM GOAL #1   Title  I with initial HEP for gentle stretching and ROM for Rt ankle.    Time  4    Period  Weeks    Status  New    Target Date  12/07/18      PT SHORT TERM GOAL #2  Title  Pt will be able to ambulate at least 400' without assistive device to work towards return to community ambulation.    Time  4    Period  Weeks    Status  New    Target Date  12/07/18      PT SHORT TERM GOAL #3   Title  Pt will achieve at least 10 deg of ankle DF and 25  deg of ankle PF to improve her gait pattern.    Time  4    Period  Weeks    Status  New    Target Date  12/07/18        PT Long Term Goals - 11/09/18 2028      PT LONG TERM GOAL #1   Title  Pt will be ind in advance HEP for ankle strength, ROM and stretching and understand how to progress herself.    Time  8    Period  Weeks    Status  New    Target Date  01/04/19      PT LONG TERM GOAL #2   Title  Pt will be able to ambulate in the community for at least 20 min without assistive device to improve her ability to walk dog and run errands with greater independence.    Time  8    Period  Weeks    Status  New    Target Date  01/04/19      PT LONG TERM GOAL #3   Title  Pt will be able to return to water aerobics with appropriate modifications as needed to demo return to prior level of function.    Time  8    Period  Weeks    Status  New    Target Date  01/04/19      PT LONG TERM GOAL #4   Title  Pt will achieve strength rating of 5/5 throughout Rt ankle including ability to perform bil heel raises x 20 reps to prevent future injury to ankle.    Time  8    Period  Weeks    Status  New    Target Date  01/04/19      PT LONG TERM GOAL #5   Title  Pt will reduce FOTO score to </= 44% to demo less limitation in daily activities.    Time  8    Period  Weeks    Status  New    Target Date  01/04/19             Plan - 11/09/18 2016    Clinical Impression Statement  Pt is s/p immobilization via casting and walking boot for conservative treatment (non-surgical) of a Rt achilles rupture.  She is cleared for full weight bearing.  She is currently ambulating household distances without an AD but uses 2-wheel walker for community distances.  Prior to injury, Pt was independent with gait, performed water aerobics at the [facility] and walked her dog throughout her neighborhood, all of which she would like to get back to.  She has limited Rt ankle ROM, slight ankle edema, Rt ankle weakness,  limited talocrural joint mobility, and flexibility limitations throughout bil calves.  She will benefit from skilled PT to address these deficits with a goal of return to prior level of function.    History and Personal Factors relevant to plan of care:  history of polio affecting Lt LE    Clinical Presentation  Stable  Clinical Presentation due to:  s/p achilles rupture with non-surgical immobilization    Clinical Decision Making  Low    Rehab Potential  Excellent    PT Frequency  2x / week    PT Duration  8 weeks    PT Treatment/Interventions  ADLs/Self Care Home Management;Cryotherapy;Electrical Stimulation;Moist Heat;Aquatic Therapy;Gait training;Stair training;Functional mobility training;Therapeutic activities;Therapeutic exercise;Balance training;Neuromuscular re-education;Patient/family education;Manual techniques;Passive range of motion;Dry needling;Taping;Vasopneumatic Device;Joint Manipulations    PT Next Visit Plan  Rt ankle gentle joint mobs and stretching, soft tissue mobilization/retrograde massage, A/ROM, strengthening, balance, gait training, gait endurance progression    PT Home Exercise Plan  Access Code: [email or web address]     Consulted and Agree with Plan of Care  Patient       Patient will benefit from skilled therapeutic intervention in order to improve the following deficits and impairments:  Abnormal gait, Decreased range of motion, Difficulty walking, Decreased activity tolerance, Pain, Decreased balance, Hypomobility, Impaired flexibility, Improper body mechanics, Increased edema, Decreased strength, Decreased mobility  Visit Diagnosis: Pain in right ankle and joints of right foot - Plan: PT plan of care cert/re-cert  Stiffness of right ankle, not elsewhere classified - Plan: PT plan of care cert/re-cert  Other abnormalities of gait and mobility - Plan: PT plan of care cert/re-cert  Muscle weakness (generalized) - Plan: PT plan of care cert/re-cert     Problem  List Patient Active Problem List   Diagnosis Date Noted  . Lumbar spinal stenosis 12/19/2017  . Pain in joint, ankle and foot 05/02/2016  . Chronic venous insufficiency 05/02/2016  . Peripheral edema 11/01/2015  . Intractable chronic migraine without aura 11/02/2014  . Tremor 04/13/2014  . Migraine without aura 04/13/2014  . Pituitary microadenoma ([place]) 04/13/2014    Violette Morneault, PT 11/09/18 8:35 PM   [facility] [address], [place], [place], [address] Phone: [phone]   Fax:  [phone]  Name: SHUNTEL FISHBURN MRN: [id number] Date of Birth: [birth year]

## 2018-11-11 ENCOUNTER — Ambulatory Visit: Payer: Medicare Other

## 2018-11-16 ENCOUNTER — Ambulatory Visit: Payer: Medicare Other | Admitting: Physical Therapy

## 2018-11-16 ENCOUNTER — Encounter: Payer: Self-pay | Admitting: Physical Therapy

## 2018-11-16 DIAGNOSIS — M6281 Muscle weakness (generalized): Secondary | ICD-10-CM

## 2018-11-16 DIAGNOSIS — R2689 Other abnormalities of gait and mobility: Secondary | ICD-10-CM

## 2018-11-16 DIAGNOSIS — M25671 Stiffness of right ankle, not elsewhere classified: Secondary | ICD-10-CM

## 2018-11-16 DIAGNOSIS — M25571 Pain in right ankle and joints of right foot: Secondary | ICD-10-CM | POA: Diagnosis not present

## 2018-11-16 NOTE — Therapy (Signed)
[facility] [address] [place], [place], [address] Phone: [phone]   Fax:  [phone]  Physical Therapy Treatment  Patient Details  Name: Kristin Gilmore MRN: [id number] Date of Birth: 14-Oct-1950 Referring Provider (PT): Wylene Simmer, MD   Encounter Date: 11/16/2018  PT End of Session - 11/16/18 1613    Visit Number  2    Date for PT Re-Evaluation  01/04/19    Authorization Type  Medicare BCBS    PT Start Time  5397    PT Stop Time  6734    PT Time Calculation (min)  39 min    Activity Tolerance  Patient tolerated treatment well;Patient limited by fatigue    Behavior During Therapy  [facility] for tasks assessed/performed       Past Medical History:  Diagnosis Date  . Arthritis   . Depression   . Dyslipidemia   . Hypertension   . Migraine   . Migraine without aura, without mention of intractable migraine without mention of status migrainosus 04/13/2014  . Pituitary microadenoma ([place]) 04/13/2014  . Pre-diabetes    "i was told i was pre-diabetic a while ago"  . Tremor 04/13/2014    Past Surgical History:  Procedure Laterality Date  . CHOLECYSTECTOMY    . COLONOSCOPY W/ POLYPECTOMY    . LUMBAR LAMINECTOMY/DECOMPRESSION MICRODISCECTOMY N/A 12/19/2017   Procedure: LAMINECTOMY LUMBAR TWO- LUMBAR THREE, LUMBAR THREE- LUMBAR FOUR, LUMBAR FOUR- LUMBAR FIVE ;  Surgeon: Consuella Lose, MD;  Location: [facility];  Service: Neurosurgery;  Laterality: N/A;  . NASAL SINUS SURGERY    . TONSILLECTOMY    . WISDOM TOOTH EXTRACTION      There were no vitals filed for this visit.  Subjective Assessment - 11/16/18 1612    Subjective  Pt states she still needs walker.  Is doing exercises but is so stiff.  Loves time in the [place] where she does more of her exercises.    Pertinent History  recent fall caused pain in Lt low back with HNP in L5/S1    Limitations  Walking    How long can you walk comfortably?  5 min, uses  walker for community distances, takes short breaks and then is active again    Patient Stated Goals  [facility], walk dog in neighborhood without pain/stiffness/tightenss                       [facility] Adult PT Treatment/Exercise - 11/16/18 0001      Ambulation/Gait   Ambulation/Gait  Yes    Ambulation/Gait Assistance  6: Modified independent (Device/Increase time)    Ambulation Distance (Feet)  100 Feet    Assistive device  Rolling walker    Gait Pattern  Step-through pattern    Ambulation Surface  Level    Gait Comments  PT cued heel strike and to stay within walker for more upright posture      Exercises   Exercises  Ankle;Knee/Hip      Knee/Hip Exercises: Supine   Straight Leg Raises  AROM;Right;15 reps      Manual Therapy   Manual Therapy  Joint mobilization;Soft tissue mobilization    Joint Mobilization  Rt talocrural and subtalar joints for DF/PF/Inv/Ever Gr II-IV    Soft tissue mobilization  retrograde massage Rt leg, achilles, soleus, gastroc      Ankle Exercises: Seated   ABC's  1 rep    Ankle Circles/Pumps  10 reps  Other Seated Ankle Exercises  A/ROM DF/PF x 20 reps               PT Short Term Goals - 11/09/18 2024      PT SHORT TERM GOAL #1   Title  I with initial HEP for gentle stretching and ROM for Rt ankle.    Time  4    Period  Weeks    Status  New    Target Date  12/07/18      PT SHORT TERM GOAL #2   Title  Pt will be able to ambulate at least 400' without assistive device to work towards return to community ambulation.    Time  4    Period  Weeks    Status  New    Target Date  12/07/18      PT SHORT TERM GOAL #3   Title  Pt will achieve at least 10 deg of ankle DF and 25 deg of ankle PF to improve her gait pattern.    Time  4    Period  Weeks    Status  New    Target Date  12/07/18        PT Long Term Goals - 11/09/18 2028      PT LONG TERM GOAL #1   Title  Pt will be ind in advance HEP for ankle  strength, ROM and stretching and understand how to progress herself.    Time  8    Period  Weeks    Status  New    Target Date  01/04/19      PT LONG TERM GOAL #2   Title  Pt will be able to ambulate in the community for at least 20 min without assistive device to improve her ability to walk dog and run errands with greater independence.    Time  8    Period  Weeks    Status  New    Target Date  01/04/19      PT LONG TERM GOAL #3   Title  Pt will be able to return to water aerobics with appropriate modifications as needed to demo return to prior level of function.    Time  8    Period  Weeks    Status  New    Target Date  01/04/19      PT LONG TERM GOAL #4   Title  Pt will achieve strength rating of 5/5 throughout Rt ankle including ability to perform bil heel raises x 20 reps to prevent future injury to ankle.    Time  8    Period  Weeks    Status  New    Target Date  01/04/19      PT LONG TERM GOAL #5   Title  Pt will reduce FOTO score to </= 44% to demo less limitation in daily activities.    Time  8    Period  Weeks    Status  New    Target Date  01/04/19            Plan - 11/16/18 1716    Clinical Impression Statement  Improved edema, soft tissue mobility and joint mobility noted with manual therapy today  heel strike and upright posture improved with cueing by PT with use of rolling walker.  Pt did not feel comfortable ambulating without walker today.  She will likely benefit from addition of aquatic therapy for improved mobility and strength in water  environment.  She is limited by fatigue with low level exercise at this time.  She will continue to benefit from skilled PT along POC for ROM, manual therapy, flexibility and gait training.      PT Frequency  2x / week    PT Duration  8 weeks    PT Treatment/Interventions  ADLs/Self Care Home Management;Cryotherapy;Electrical Stimulation;Moist Heat;Aquatic Therapy;Gait training;Stair training;Functional mobility  training;Therapeutic activities;Therapeutic exercise;Balance training;Neuromuscular re-education;Patient/family education;Manual techniques;Passive range of motion;Dry needling;Taping;Vasopneumatic Device;Joint Manipulations    PT Next Visit Plan  Rt ankle gentle joint mobs and stretching, soft tissue mobilization/retrograde massage, A/ROM, strengthening, balance, gait training, gait endurance progression    PT Home Exercise Plan  Access Code: [email or web address]     Consulted and Agree with Plan of Care  Patient       Patient will benefit from skilled therapeutic intervention in order to improve the following deficits and impairments:  Abnormal gait, Decreased range of motion, Difficulty walking, Decreased activity tolerance, Pain, Decreased balance, Hypomobility, Impaired flexibility, Improper body mechanics, Increased edema, Decreased strength, Decreased mobility  Visit Diagnosis: Pain in right ankle and joints of right foot  Stiffness of right ankle, not elsewhere classified  Other abnormalities of gait and mobility  Muscle weakness (generalized)     Problem List Patient Active Problem List   Diagnosis Date Noted  . Lumbar spinal stenosis 12/19/2017  . Pain in joint, ankle and foot 05/02/2016  . Chronic venous insufficiency 05/02/2016  . Peripheral edema 11/01/2015  . Intractable chronic migraine without aura 11/02/2014  . Tremor 04/13/2014  . Migraine without aura 04/13/2014  . 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 [place], [place], [address] Phone: [phone]   Fax:  [phone]  Name: AURORE REDINGER MRN: [id number] Date of Birth: 15-Jul-1951

## 2018-11-18 ENCOUNTER — Encounter: Payer: Self-pay | Admitting: Physical Therapy

## 2018-11-18 ENCOUNTER — Ambulatory Visit: Payer: Medicare Other | Admitting: Physical Therapy

## 2018-11-18 DIAGNOSIS — M6281 Muscle weakness (generalized): Secondary | ICD-10-CM

## 2018-11-18 DIAGNOSIS — M25571 Pain in right ankle and joints of right foot: Secondary | ICD-10-CM

## 2018-11-18 DIAGNOSIS — R2689 Other abnormalities of gait and mobility: Secondary | ICD-10-CM

## 2018-11-18 DIAGNOSIS — M25671 Stiffness of right ankle, not elsewhere classified: Secondary | ICD-10-CM

## 2018-11-18 NOTE — Therapy (Signed)
[facility] [address], [place], [place], [address] Phone: [phone]   Fax:  [phone]  Physical Therapy Treatment  Patient Details  Name: Kristin Gilmore MRN: [id number] Date of Birth: [birth year] Referring Provider (PT): Wylene Simmer, MD   Encounter Date: 11/18/2018  PT End of Session - 11/18/18 1626    Visit Number  3    Date for PT Re-Evaluation  01/04/19    Authorization Type  Medicare BCBS    PT Start Time  6440    PT Stop Time  1620    PT Time Calculation (min)  45 min    Activity Tolerance  Patient tolerated treatment well    Behavior During Therapy  [facility] for tasks assessed/performed       Past Medical History:  Diagnosis Date  . Arthritis   . Depression   . Dyslipidemia   . Hypertension   . Migraine   . Migraine without aura, without mention of intractable migraine without mention of status migrainosus 04/13/2014  . Pituitary microadenoma ([place]) 04/13/2014  . Pre-diabetes    "i was told i was pre-diabetic a while ago"  . Tremor 04/13/2014    Past Surgical History:  Procedure Laterality Date  . CHOLECYSTECTOMY    . COLONOSCOPY W/ POLYPECTOMY    . LUMBAR LAMINECTOMY/DECOMPRESSION MICRODISCECTOMY N/A 12/19/2017   Procedure: LAMINECTOMY LUMBAR TWO- LUMBAR THREE, LUMBAR THREE- LUMBAR FOUR, LUMBAR FOUR- LUMBAR FIVE ;  Surgeon: Consuella Lose, MD;  Location: [facility];  Service: Neurosurgery;  Laterality: N/A;  . NASAL SINUS SURGERY    . TONSILLECTOMY    . WISDOM TOOTH EXTRACTION      There were no vitals filed for this visit.  Subjective Assessment - 11/18/18 1538    Subjective  Right thigh hurts.  Ankle and achilles feel fine, just very stiff.  I'm working on staying taller with use of the walker.    Pertinent History  recent fall caused pain in Lt low back with HNP in L5/S1    Limitations  Walking    How long can you walk comfortably?  5 min, uses walker for community distances, takes short  breaks and then is active again    Patient Stated Goals  [facility] aerobics [facility], walk dog in neighborhood without pain/stiffness/tightenss    Currently in Pain?  Yes    Pain Score  8     Pain Location  Leg    Pain Orientation  Upper;Lateral    Pain Descriptors / Indicators  Aching;Throbbing    Pain Type  Acute pain    Pain Frequency  Constant                       [facility] Adult PT Treatment/Exercise - 11/18/18 0001      Exercises   Exercises  Ankle;Knee/Hip      Knee/Hip Exercises: Sidelying   Other Sidelying Knee/Hip Exercises  long arc quads on Rt x 10 reps, pillow between knees      Manual Therapy   Manual Therapy  Soft tissue mobilization;Joint mobilization    Joint Mobilization  Rt talocrural and subtalar joints for DF/PF/Inv/Ever Gr II-IV    Soft tissue mobilization  Rt ITB in Lt sidelying      Ankle Exercises: Aerobic   Nustep  L1 x 6'      Ankle Exercises: Seated   Other Seated Ankle Exercises  rocker board ankle DF/PF x 50 reps  PT Education - 11/18/18 1626    Education Details  aquatic PT instructions    Person(s) Educated  Patient    Methods  Explanation;Demonstration;Verbal cues;Handout    Comprehension  Verbalized understanding;Returned demonstration       PT Short Term Goals - 11/18/18 1538      PT SHORT TERM GOAL #1   Title  I with initial HEP for gentle stretching and ROM for Rt ankle.    Status  On-going        PT Long Term Goals - 11/09/18 2028      PT LONG TERM GOAL #1   Title  Pt will be ind in advance HEP for ankle strength, ROM and stretching and understand how to progress herself.    Time  8    Period  Weeks    Status  New    Target Date  01/04/19      PT LONG TERM GOAL #2   Title  Pt will be able to ambulate in the community for at least 20 min without assistive device to improve her ability to walk dog and run errands with greater independence.    Time  8    Period  Weeks    Status  New    Target Date   01/04/19      PT LONG TERM GOAL #3   Title  Pt will be able to return to water aerobics with appropriate modifications as needed to demo return to prior level of function.    Time  8    Period  Weeks    Status  New    Target Date  01/04/19      PT LONG TERM GOAL #4   Title  Pt will achieve strength rating of 5/5 throughout Rt ankle including ability to perform bil heel raises x 20 reps to prevent future injury to ankle.    Time  8    Period  Weeks    Status  New    Target Date  01/04/19      PT LONG TERM GOAL #5   Title  Pt will reduce FOTO score to </= 44% to demo less limitation in daily activities.    Time  8    Period  Weeks    Status  New    Target Date  01/04/19            Plan - 11/18/18 1627    Clinical Impression Statement  Pt continues to have significant (albeit expected) limitation in ROM in Rt ankle.  PT performed joint and soft tissue mobilization today yielding greater joint play for all planes of ankle motion.  Pt reported resolution of 8/10 thigh pain after soft tissue mobs to Rt ITB.  Pt scheduled aquatic PT beginning in March to help increase her mobility in a more buoyant environment.  She continues to use rolling walker at this time.    PT Frequency  2x / week    PT Duration  8 weeks    PT Treatment/Interventions  ADLs/Self Care Home Management;Cryotherapy;Electrical Stimulation;Moist Heat;Aquatic Therapy;Gait training;Stair training;Functional mobility training;Therapeutic activities;Therapeutic exercise;Balance training;Neuromuscular re-education;Patient/family education;Manual techniques;Passive range of motion;Dry needling;Taping;Vasopneumatic Device;Joint Manipulations    PT Next Visit Plan  Rt ankle gentle joint mobs and stretching, soft tissue mobilization/retrograde massage, A/ROM, strengthening, balance, gait training, gait endurance progression    PT Home Exercise Plan  Access Code: [email or web address]     Recommended Other Services  aquatic PT    Consulted  and  Agree with Plan of Care  Patient       Patient will benefit from skilled therapeutic intervention in order to improve the following deficits and impairments:  Abnormal gait, Decreased range of motion, Difficulty walking, Decreased activity tolerance, Pain, Decreased balance, Hypomobility, Impaired flexibility, Improper body mechanics, Increased edema, Decreased strength, Decreased mobility  Visit Diagnosis: Pain in right ankle and joints of right foot  Stiffness of right ankle, not elsewhere classified  Other abnormalities of gait and mobility  Muscle weakness (generalized)     Problem List Patient Active Problem List   Diagnosis Date Noted  . Lumbar spinal stenosis 12/19/2017  . Pain in joint, ankle and foot 05/02/2016  . Chronic venous insufficiency 05/02/2016  . Peripheral edema 11/01/2015  . Intractable chronic migraine without aura 11/02/2014  . Tremor 04/13/2014  . Migraine without aura 04/13/2014  . Pituitary microadenoma ([place]) 04/13/2014    Johanna Beuhring, PT 11/18/18 4:34 PM   [facility] [address], [place], [place], [address] Phone: [phone]   Fax:  [phone]  Name: Kristin Gilmore MRN: [id number] Date of Birth: 1951-03-09

## 2018-11-18 NOTE — Patient Instructions (Signed)
     [facility] Welcome to [facility]! Here you will find all the information you will need regarding your pool therapy. If you have further questions at any time, please call our office at [phone]. After completing your initial evaluation in the [facility], you may be eligible to complete a portion of your therapy in the pool. A typical week of therapy will consist of 1-2 typical physical therapy visits at our [facility] location and an additional session of therapy in the pool located at the [facility] at [address], [place], [place] [address].  Aquatic therapy will be offered on Friday afternoons. Each session will last approximately 30 minutes. All scheduling and payments for aquatic therapy sessions, including cancelations, will be done through our [facility] location.  To be eligible for aquatic therapy, these criteria must be met: . You must be able to independently change in the locker room and get to the pool deck. A caregiver can come with you to help if needed. There are bleachers for a caregiver to sit on next to the pool. . No one with an open wound is permitted in the pool.  Handicap parking is available in the front and there is a drop off option for even closer accessibility. Please arrive 15 minutes prior to your appointment to prepare for your pool session. You must sign in at the front desk upon your arrival. Please be sure to attend to any toileting needs prior to entering the pool. Locker rooms for changing are located to the right of the check-in desk. There is direct access to the pool deck from the locker room. You can lock your belongings in a locker but must bring a lock. Your therapist will greet you on the pool deck. There may be other swimmers in the pool at the same time but your session is one-on-one with the therapist.      

## 2018-11-23 ENCOUNTER — Encounter: Payer: Medicare Other | Admitting: Physical Therapy

## 2018-11-24 ENCOUNTER — Other Ambulatory Visit: Payer: Self-pay | Admitting: Physician Assistant

## 2018-11-24 DIAGNOSIS — M545 Low back pain, unspecified: Secondary | ICD-10-CM

## 2018-11-24 DIAGNOSIS — G8929 Other chronic pain: Secondary | ICD-10-CM

## 2018-11-26 ENCOUNTER — Other Ambulatory Visit: Payer: Self-pay | Admitting: Physical Medicine and Rehabilitation

## 2018-11-26 ENCOUNTER — Encounter: Payer: Medicare Other | Admitting: Physical Therapy

## 2018-11-26 DIAGNOSIS — G8929 Other chronic pain: Secondary | ICD-10-CM

## 2018-11-26 DIAGNOSIS — M545 Low back pain: Principal | ICD-10-CM

## 2018-12-01 ENCOUNTER — Encounter: Payer: Medicare Other | Admitting: Physical Therapy

## 2018-12-02 ENCOUNTER — Ambulatory Visit
Admission: RE | Admit: 2018-12-02 | Discharge: 2018-12-02 | Disposition: A | Discharge status: Home / Self Care | Payer: Medicare Other | Source: Ambulatory Visit | Attending: Physician Assistant | Admitting: Physician Assistant

## 2018-12-02 ENCOUNTER — Other Ambulatory Visit: Payer: Self-pay | Admitting: Physical Medicine and Rehabilitation

## 2018-12-02 DIAGNOSIS — G8929 Other chronic pain: Secondary | ICD-10-CM

## 2018-12-02 DIAGNOSIS — M545 Low back pain: Principal | ICD-10-CM

## 2018-12-02 MED ORDER — METHYLPREDNISOLONE ACETATE 40 MG/ML INJ SUSP (RADIOLOG
120.0000 mg | Freq: Once | INTRAMUSCULAR | Status: AC
  Administered 2018-12-02: 120 mg via EPIDURAL

## 2018-12-02 MED ORDER — IOPAMIDOL (ISOVUE-M 200) INJECTION 41%
1.0000 mL | Freq: Once | INTRAMUSCULAR | Status: AC
  Administered 2018-12-02: 1 mL via EPIDURAL

## 2018-12-02 NOTE — Discharge Instructions (Signed)

## 2018-12-03 ENCOUNTER — Encounter: Payer: Medicare Other | Admitting: Physical Therapy

## 2018-12-04 ENCOUNTER — Ambulatory Visit: Payer: Medicare Other | Attending: Orthopedic Surgery | Admitting: Physical Therapy

## 2018-12-04 ENCOUNTER — Encounter: Payer: Self-pay | Admitting: Physical Therapy

## 2018-12-04 DIAGNOSIS — M6281 Muscle weakness (generalized): Secondary | ICD-10-CM

## 2018-12-04 DIAGNOSIS — M25671 Stiffness of right ankle, not elsewhere classified: Secondary | ICD-10-CM | POA: Diagnosis present

## 2018-12-04 DIAGNOSIS — R2689 Other abnormalities of gait and mobility: Secondary | ICD-10-CM | POA: Diagnosis present

## 2018-12-04 DIAGNOSIS — M25571 Pain in right ankle and joints of right foot: Secondary | ICD-10-CM | POA: Diagnosis present

## 2018-12-04 NOTE — Therapy (Signed)
[facility] [address] [place], [place], [address] Phone: [phone]   Fax:  [phone]  Physical Therapy Treatment  Patient Details  Name: Kristin Gilmore MRN: [id number] Date of Birth: 30-Mar-1951 Referring Provider (PT): Wylene Simmer, MD   Encounter Date: 12/04/2018  PT End of Session - 12/04/18 1610    Visit Number  4    Date for PT Re-Evaluation  01/04/19    Authorization Type  Medicare BCBS    PT Start Time  1340    PT Stop Time  1430    PT Time Calculation (min)  50 min    Equipment Utilized During Treatment  Other (comment)   Pool noodles   Activity Tolerance  Patient tolerated treatment well    Behavior During Therapy  [facility] for tasks assessed/performed       Past Medical History:  Diagnosis Date  . Arthritis   . Depression   . Dyslipidemia   . Hypertension   . Migraine   . Migraine without aura, without mention of intractable migraine without mention of status migrainosus 04/13/2014  . Pituitary microadenoma ([place]) 04/13/2014  . Pre-diabetes    "i was told i was pre-diabetic a while ago"  . Tremor 04/13/2014    Past Surgical History:  Procedure Laterality Date  . CHOLECYSTECTOMY    . COLONOSCOPY W/ POLYPECTOMY    . LUMBAR LAMINECTOMY/DECOMPRESSION MICRODISCECTOMY N/A 12/19/2017   Procedure: LAMINECTOMY LUMBAR TWO- LUMBAR THREE, LUMBAR THREE- LUMBAR FOUR, LUMBAR FOUR- LUMBAR FIVE ;  Surgeon: Consuella Lose, MD;  Location: [facility];  Service: Neurosurgery;  Laterality: N/A;  . NASAL SINUS SURGERY    . TONSILLECTOMY    . WISDOM TOOTH EXTRACTION      There were no vitals filed for this visit.  Subjective Assessment - 12/04/18 1615    Subjective  I got an injection for my back and it has taken me some time to recover from it. My ankle is very stiff.     Pertinent History  recent fall caused pain in Lt low back with HNP in L5/S1    Currently in Pain?  No/denies   Stiff   Pain Location  Ankle     Pain Orientation  Right    Pain Descriptors / Indicators  Tightness    Pain Frequency  Intermittent       Aquatic therapy: Water temp 87 degrees F. Pt entered pool via long ramp with bilateral hand rails.  Pt seated on bench in water: Soft tissue mobilization for RTLE, gastroc. Ankle gd 1, 2, 3 mobs for dorsiflexion. A/ROM Rt ankle all driections 10-20x Standing RT gastroc stretch in 4 feet water: 3x 20 sec Walking in 20ft water with pool noodles on each side to support RTE, decrease pain. 4 lengths of pool with intermitttent decompression hanging on the noodle for lumbar fatigue. Verbal cues to push off her toes vs walking flat footed.   Heel raises 2x10 in 84ft water. Single leg stance RT 3x 10 sec in 4 feet of water.  Bicycle with noodle behind pt x 1 min Ended session with 3 min of decompression hang for Bil LE.  Pt had no pain when performing her exercises and demonstrated improved mid foot mobility upon leaving the pool deck.                           PT Short Term Goals - 11/18/18 1538  PT SHORT TERM GOAL #1   Title  I with initial HEP for gentle stretching and ROM for Rt ankle.    Status  On-going        PT Long Term Goals - 11/09/18 2028      PT LONG TERM GOAL #1   Title  Pt will be ind in advance HEP for ankle strength, ROM and stretching and understand how to progress herself.    Time  8    Period  Weeks    Status  New    Target Date  01/04/19      PT LONG TERM GOAL #2   Title  Pt will be able to ambulate in the community for at least 20 min without assistive device to improve her ability to walk dog and run errands with greater independence.    Time  8    Period  Weeks    Status  New    Target Date  01/04/19      PT LONG TERM GOAL #3   Title  Pt will be able to return to water aerobics with appropriate modifications as needed to demo return to prior level of function.    Time  8    Period  Weeks    Status  New    Target Date   01/04/19      PT LONG TERM GOAL #4   Title  Pt will achieve strength rating of 5/5 throughout Rt ankle including ability to perform bil heel raises x 20 reps to prevent future injury to ankle.    Time  8    Period  Weeks    Status  New    Target Date  01/04/19      PT LONG TERM GOAL #5   Title  Pt will reduce FOTO score to </= 44% to demo less limitation in daily activities.    Time  8    Period  Weeks    Status  New    Target Date  01/04/19            Plan - 12/04/18 1617    Clinical Impression Statement  Pt presents today for an aquatic therapy session in 4 feet of water with a temperature of 87 degrees. Pt entered the pool via a long ramp with rails on both sides that pt utilized. She was very guarded in her ambulation as she is afraid to hurt her back. She did not complain of back or ankle pain throughout her session. Main focus of aquatic session was to use the bouyancy of the water to unweight the RTLE enough to aloow her move properly through her ankle and foot pain free. We used the viscosity current created in the water to give pt enough resistance to work on her RTLE strength. techniques underwaterwere used to obilize the ankle joint and perform fascial work along the Rt posterior leg. Pt exited the pool slowly up the ramp using the rails.     Rehab Potential  Excellent    PT Frequency  2x / week    PT Duration  8 weeks    PT Treatment/Interventions  ADLs/Self Care Home Management;Cryotherapy;Electrical Stimulation;Moist Heat;Aquatic Therapy;Gait training;Stair training;Functional mobility training;Therapeutic activities;Therapeutic exercise;Balance training;Neuromuscular re-education;Patient/family education;Manual techniques;Passive range of motion;Dry needling;Taping;Vasopneumatic Device;Joint Manipulations    PT Next Visit Plan  Land based therapy to work on ankle mobility and weaning off her walker.    PT Home Exercise Plan  Access Code: [email or web address]  Consulted and Agree  with Plan of Care  Patient       Patient will benefit from skilled therapeutic intervention in order to improve the following deficits and impairments:  Abnormal gait, Decreased range of motion, Difficulty walking, Decreased activity tolerance, Pain, Decreased balance, Hypomobility, Impaired flexibility, Improper body mechanics, Increased edema, Decreased strength, Decreased mobility  Visit Diagnosis: Pain in right ankle and joints of right foot  Stiffness of right ankle, not elsewhere classified  Other abnormalities of gait and mobility  Muscle weakness (generalized)     Problem List Patient Active Problem List   Diagnosis Date Noted  . Lumbar spinal stenosis 12/19/2017  . Pain in joint, ankle and foot 05/02/2016  . Chronic venous insufficiency 05/02/2016  . Peripheral edema 11/01/2015  . Intractable chronic migraine without aura 11/02/2014  . Tremor 04/13/2014  . Migraine without aura 04/13/2014  . Pituitary microadenoma ([place]) 04/13/2014    ,, PTA 12/04/2018, 4:25 PM  [facility] [address], [place], [place], [address] Phone: [phone]   Fax:  [phone]  Name: Kristin Gilmore MRN: [id number] Date of Birth: 01-07-1951

## 2018-12-07 ENCOUNTER — Encounter: Payer: Medicare Other | Admitting: Physical Therapy

## 2018-12-10 ENCOUNTER — Encounter: Payer: Medicare Other | Admitting: Physical Therapy

## 2018-12-11 ENCOUNTER — Encounter: Payer: Medicare Other | Admitting: Physical Therapy

## 2018-12-16 ENCOUNTER — Encounter: Payer: Medicare Other | Admitting: Physical Therapy

## 2018-12-16 NOTE — Therapy (Addendum)
[facility] [address], [place], [place], [address] Phone: [phone]   Fax:  [phone]  Physical Therapy Treatment  Patient Details  Name: Kristin Gilmore MRN: [id number] Date of Birth: 06-02-1951 Referring Provider (PT): Wylene Simmer, MD   Encounter Date: 12/04/2018    Past Medical History:  Diagnosis Date  . Arthritis   . Depression   . Dyslipidemia   . Hypertension   . Migraine   . Migraine without aura, without mention of intractable migraine without mention of status migrainosus 04/13/2014  . Pituitary microadenoma ([place]) 04/13/2014  . Pre-diabetes    "i was told i was pre-diabetic a while ago"  . Tremor 04/13/2014    Past Surgical History:  Procedure Laterality Date  . CHOLECYSTECTOMY    . COLONOSCOPY W/ POLYPECTOMY    . LUMBAR LAMINECTOMY/DECOMPRESSION MICRODISCECTOMY N/A 12/19/2017   Procedure: LAMINECTOMY LUMBAR TWO- LUMBAR THREE, LUMBAR THREE- LUMBAR FOUR, LUMBAR FOUR- LUMBAR FIVE ;  Surgeon: Consuella Lose, MD;  Location: [facility];  Service: Neurosurgery;  Laterality: N/A;  . NASAL SINUS SURGERY    . TONSILLECTOMY    . WISDOM TOOTH EXTRACTION      There were no vitals filed for this visit.                              PT Short Term Goals - 11/18/18 1538      PT SHORT TERM GOAL #1   Title  I with initial HEP for gentle stretching and ROM for Rt ankle.    Status  On-going        PT Long Term Goals - 11/09/18 2028      PT LONG TERM GOAL #1   Title  Pt will be ind in advance HEP for ankle strength, ROM and stretching and understand how to progress herself.    Time  8    Period  Weeks    Status  New    Target Date  01/04/19      PT LONG TERM GOAL #2   Title  Pt will be able to ambulate in the community for at least 20 min without assistive device to improve her ability to walk dog and run errands with greater independence.    Time  8    Period  Weeks     Status  New    Target Date  01/04/19      PT LONG TERM GOAL #3   Title  Pt will be able to return to water aerobics with appropriate modifications as needed to demo return to prior level of function.    Time  8    Period  Weeks    Status  New    Target Date  01/04/19      PT LONG TERM GOAL #4   Title  Pt will achieve strength rating of 5/5 throughout Rt ankle including ability to perform bil heel raises x 20 reps to prevent future injury to ankle.    Time  8    Period  Weeks    Status  New    Target Date  01/04/19      PT LONG TERM GOAL #5   Title  Pt will reduce FOTO score to </= 44% to demo less limitation in daily activities.    Time  8    Period  Weeks    Status  New    Target Date  01/04/19              Patient will benefit from skilled therapeutic intervention in order to improve the following deficits and impairments:  Abnormal gait, Decreased range of motion, Difficulty walking, Decreased activity tolerance, Pain, Decreased balance, Hypomobility, Impaired flexibility, Improper body mechanics, Increased edema, Decreased strength, Decreased mobility  Visit Diagnosis: Pain in right ankle and joints of right foot  Stiffness of right ankle, not elsewhere classified  Other abnormalities of gait and mobility  Muscle weakness (generalized)     Problem List Patient Active Problem List   Diagnosis Date Noted  . Lumbar spinal stenosis 12/19/2017  . Pain in joint, ankle and foot 05/02/2016  . Chronic venous insufficiency 05/02/2016  . Peripheral edema 11/01/2015  . Intractable chronic migraine without aura 11/02/2014  . Tremor 04/13/2014  . Migraine without aura 04/13/2014  . Pituitary microadenoma ([facility]) 04/13/2014    Kristin Gilmore 12/16/2018, 9:12 AM  [facility] [address], [place], [place], [address] Phone: [phone]   Fax:  [phone]  Name: Kristin Gilmore MRN:  [id number] Date of Birth: 27-Oct-1950  Access Code: [email or web address]  URL: [email or web address]  Date: 12/16/2018  Prepared by: Myrene Galas   Exercises  Standing Hip Abduction Adduction at [facility] - 10 reps - 1 sets - 1x daily - 3x weekly  Squat - 10 reps - 1 sets - 1x daily - 3x weekly  Standing Hip Flexion Extension at [employer] - 10 reps - 1 sets - 1x daily - 3x weekly  Standing March at [facility] - 10 reps - 1 sets - 1x daily - 3x weekly  Heel Toe Raises at [place] - 10 reps - 1 sets - 1x daily - 3x weekly  Standing Hip Circles at [employer] - 10 reps - 1 sets - 1x daily - 3x weekly  Lateral Stepping at [place] - 10 reps - 1 sets - 1x daily - 3x weekly  Single Leg Stance - 5 reps - 1 sets - 1x daily - 3x weekly    PHYSICAL THERAPY DISCHARGE SUMMARY  Visits from Start of Care: 4  Current functional level related to goals / functional outcomes: See above   Remaining deficits: See above   Education / Equipment: HEP Plan: Patient agrees to discharge.  Patient goals were partially met. Patient is being discharged due to meeting the stated rehab goals.  ?????     Johanna Beuhring, PT 02/04/19 6:05 PM

## 2018-12-25 ENCOUNTER — Encounter: Payer: Medicare Other | Admitting: Physical Therapy

## 2019-01-26 ENCOUNTER — Ambulatory Visit: Payer: Medicare Other | Admitting: Podiatry

## 2019-04-22 ENCOUNTER — Telehealth: Payer: Self-pay | Admitting: Psychiatry

## 2019-04-22 NOTE — Telephone Encounter (Signed)
Kristin Gilmore called to request a refill on a medication.  Which medicine is unclear.  She wanted it sent to [facility] on [address].  She has not been seen in the office since 06/2017.  She cancelled her appt. 06/2018 and never RS.  I called the patient back but had to LM to let her know that Dr. [name] could not prescribe a medication for her since she hasn't been seen for so long.  I asked her to call the office to schedule an appt. So that we could get her re-established as a patient.

## 2019-04-22 NOTE — Telephone Encounter (Signed)
error 

## 2019-04-23 ENCOUNTER — Other Ambulatory Visit: Payer: Self-pay

## 2019-04-23 MED ORDER — QUETIAPINE FUMARATE 300 MG PO TABS: 150.0000 mg | ORAL_TABLET | Freq: Every day | ORAL | 0 refills | Status: DC

## 2019-04-23 NOTE — Telephone Encounter (Signed)
Ok refill of 5 days of quetiapine 300 mg 1.5 tablets HS

## 2019-04-23 NOTE — Telephone Encounter (Signed)
Clarification dose is quetiapine 300 mg takes 0.5 tablets (150 mg) at hs. Submitted for a 6 day supply

## 2019-04-28 ENCOUNTER — Other Ambulatory Visit: Payer: Self-pay

## 2019-04-28 ENCOUNTER — Ambulatory Visit (INDEPENDENT_AMBULATORY_CARE_PROVIDER_SITE_OTHER): Payer: Medicare Other | Admitting: Psychiatry

## 2019-04-28 ENCOUNTER — Encounter: Payer: Self-pay | Admitting: Psychiatry

## 2019-04-28 DIAGNOSIS — F3171 Bipolar disorder, in partial remission, most recent episode hypomanic: Secondary | ICD-10-CM

## 2019-04-28 DIAGNOSIS — Z9119 Patient's noncompliance with other medical treatment and regimen: Secondary | ICD-10-CM

## 2019-04-28 DIAGNOSIS — F411 Generalized anxiety disorder: Secondary | ICD-10-CM

## 2019-04-28 DIAGNOSIS — Z91199 Patient's noncompliance with other medical treatment and regimen due to unspecified reason: Secondary | ICD-10-CM

## 2019-04-28 DIAGNOSIS — F5105 Insomnia due to other mental disorder: Secondary | ICD-10-CM | POA: Diagnosis not present

## 2019-04-28 MED ORDER — QUETIAPINE FUMARATE 300 MG PO TABS: 150.0000 mg | ORAL_TABLET | Freq: Every day | ORAL | 3 refills | Status: DC

## 2019-04-28 NOTE — Progress Notes (Signed)
Kristin Gilmore [id number] 23-Oct-1950 68 y.o.  Virtual Visit via Telephone Note  I connected with@ on 04/28/19 at  4:00 PM EDT by telephone and verified that I am speaking with the correct person using two identifiers.   I discussed the limitations, risks, security and privacy concerns of performing an evaluation and management service by telephone and the availability of in person appointments. I also discussed with the patient that there may be a patient responsible charge related to this service. The patient expressed understanding and agreed to proceed.   I discussed the assessment and treatment plan with the patient. The patient was provided an opportunity to ask questions and all were answered. The patient agreed with the plan and demonstrated an understanding of the instructions.   The patient was advised to call back or seek an in-person evaluation if the symptoms worsen or if the condition fails to improve as anticipated.  I provided 15 minutes of non-face-to-face time during this encounter.  The patient was located at home.  The provider was located at [place].   Purnell Shoemaker, MD   Subjective:   Patient ID:  Kristin Gilmore is a 68 y.o. (DOB Oct 03, 1950) female.  Chief Complaint:  Chief Complaint  Patient presents with  . Follow-up    med management    HPI Kristin Gilmore presents for follow-up of bipolar and GAD.  Last seen October 2018.  She has not kept appointments as recommended or scheduled.  Patient reports stable mood and denies depressed or irritable moods.  Patient denies any recent difficulty with anxiety.  Patient denies difficulty with sleep initiation or maintenance. Denies appetite disturbance.  Patient reports that energy and motivation have been good.  Patient denies any difficulty with concentration.  Patient denies any suicidal ideation. She denies manic symptoms.  Primary stressor is her husband Kristin Gilmore has Parkinson's  disease and she has some chronic back pain.  Past Psychiatric Medication Trials: Olanzapine, risperidone, Depakote, Seroquel, lamotrigine, lorazepam, propranolol, paroxetine no response, citalopram no response, amitriptyline, so sertraline Overdose on pills and 31 at 68 years old  Review of Systems:  Review of Systems  Constitutional: Positive for unexpected weight change.  Musculoskeletal: Positive for back pain.  Neurological: Negative for tremors and weakness.    Medications: I have reviewed the patient's current medications.  Current Outpatient Medications  Medication Sig Dispense Refill  . metoprolol (LOPRESSOR) 50 MG tablet Take 75 mg by mouth 2 (two) times daily. With supper & with breakfast    . propranolol (INDERAL) 10 MG tablet Take 10 mg by mouth daily as needed (for hand tremors).    Marland Kitchen acetaminophen (TYLENOL) 500 MG tablet Take 1,000 mg by mouth 2 (two) times daily.     . Calcium Citrate (CAL-CITRATE PO) Take 1-2 tablets by mouth 2 (two) times daily. TAKE 1 TABLET IN THE MORNING & TAKE 2 TABLETS WITH SUPPER    . Cholecalciferol (VITAMIN D3) 2000 UNITS TABS Take 2,000 Units by mouth daily with supper.     . Coenzyme Q10 (COQ10 PO) Take 100 mg by mouth daily with supper. H-Eq10 (CoQ10 with Hemp)    . Coenzyme Q10 (COQ10) 100 MG CAPS Take 100 mg by mouth daily with supper.    . dicyclomine (BENTYL) 20 MG tablet Take 20 mg by mouth 2 (two) times daily as needed for spasms.    . diphenhydrAMINE (BENADRYL) 25 MG tablet Take 25 mg by mouth every 6 (six) hours as needed.    Marland Kitchen  fluticasone (FLONASE) 50 MCG/ACT nasal spray Place 1 spray into both nostrils daily.     . Glucosamine-Chondroitin (COSAMIN DS PO) Take 3 capsules by mouth daily. 1500 glucosamine    . HYDROcodone-acetaminophen (NORCO/VICODIN) 5-325 MG tablet Take 1 tablet by mouth every 4 (four) hours as needed for moderate pain.    Marland Kitchen loperamide (IMODIUM) 2 MG capsule Take 2-4 mg by mouth 2 (two) times daily as needed for  diarrhea or loose stools.    Marland Kitchen loratadine (CLARITIN) 10 MG tablet Take 10 mg by mouth daily.    Marland Kitchen MAGNESIUM SULFATE PO Take 1 tablet by mouth at bedtime.     . methocarbamol (ROBAXIN) 500 MG tablet Take 1 tablet (500 mg total) by mouth every 6 (six) hours as needed for muscle spasms. (Patient not taking: Reported on 04/28/2019) 45 tablet 1  . naproxen sodium (ALEVE) 220 MG tablet Take 220 mg by mouth 2 (two) times daily as needed (for pain.).    Marland Kitchen Omega 3 1000 MG CAPS Take 1,000 mg by mouth 2 (two) times daily. SUPER OMEGA-3    . OVER THE COUNTER MEDICATION Take 2 capsules by mouth daily with supper. Quick 6 Weight Loss Supplement    . Oxycodone HCl 10 MG TABS Take 1 tablet (10 mg total) by mouth every 6 (six) hours as needed ((score 7 to 10)). (Patient not taking: Reported on 04/28/2019) 50 tablet 0  . polyethylene glycol (MIRALAX / GLYCOLAX) packet Take 17 g by mouth daily. 14 each 0  . Probiotic Product (PROBIOTIC PO) Take 1 capsule by mouth daily. SUPREMA DOPHILUS 5 BILLION CFU    . QUEtiapine (SEROQUEL) 300 MG tablet Take 0.5 tablets (150 mg total) by mouth at bedtime. 45 tablet 3  . Red Yeast Rice 600 MG CAPS Take 1,200 mg by mouth 2 (two) times daily.    Marland Kitchen ROYAL JELLY PO Take 150 mg by mouth daily.    . TURMERIC PO Take 475 mg by mouth 2 (two) times daily. WITH FOOD    . vitamin E 400 UNIT capsule Take 400 Units by mouth daily with supper.      No current facility-administered medications for this visit.     Medication Side Effects: None  Allergies:  Allergies  Allergen Reactions  . Amoxicillin Hives    UNSPECIFIED REACTION  Has patient had a PCN reaction causing immediate rash, facial/tongue/throat swelling, SOB or lightheadedness with hypotension: Unknown Has patient had a PCN reaction causing severe rash involving mucus membranes or skin necrosis: Unknown Has patient had a PCN reaction that required hospitalization: Unknown Has patient had a PCN reaction occurring within the last  10 years: No If all of the above answers are "NO", then may proceed with Cephalosporin use.   . Clarithromycin Hives, Other (See Comments) and Rash  . Atropine Hives    Past Medical History:  Diagnosis Date  . Arthritis   . Depression   . Dyslipidemia   . Hypertension   . Migraine   . Migraine without aura, without mention of intractable migraine without mention of status migrainosus 04/13/2014  . Pituitary microadenoma ([place]) 04/13/2014  . Pre-diabetes    "i was told i was pre-diabetic a while ago"  . Tremor 04/13/2014    Family History  Problem Relation Age of Onset  . Cancer Father        stomach cancer  . Migraines Mother     Social History   Socioeconomic History  . Marital status: Married  Spouse name: Not on file  . Number of children: 1  . Years of education: college  . Highest education level: Not on file  Occupational History  . Occupation: Retired  Scientific laboratory technician  . Financial resource strain: Not on file  . Food insecurity    Worry: Not on file    Inability: Not on file  . Transportation needs    Medical: Not on file    Non-medical: Not on file  Tobacco Use  . Smoking status: Never Smoker  . Smokeless tobacco: Never Used  Substance and Sexual Activity  . Alcohol use: Yes    Alcohol/week: 0.0 standard drinks    Comment: occas.  . Drug use: No  . Sexual activity: Not on file  Lifestyle  . Physical activity    Days per week: Not on file    Minutes per session: Not on file  . Stress: Not on file  Relationships  . Social Herbalist on phone: Not on file    Gets together: Not on file    Attends religious service: Not on file    Active member of club or organization: Not on file    Attends meetings of clubs or organizations: Not on file    Relationship status: Not on file  . Intimate partner violence    Fear of current or ex partner: Not on file    Emotionally abused: Not on file    Physically abused: Not on file    Forced sexual  activity: Not on file  Other Topics Concern  . Not on file  Social History Narrative   Lives at home, married   Patient does not drink caffeine.   Patient is right handed.    Past Medical History, Surgical history, Social history, and Family history were reviewed and updated as appropriate.   Please see review of systems for further details on the patient's review from today.   Objective:   Physical Exam:  There were no vitals taken for this visit.  Physical Exam Neurological:     Mental Status: She is alert and oriented to person, place, and time.     Cranial Nerves: No dysarthria.  Psychiatric:        Attention and Perception: Attention normal.        Mood and Affect: Mood is not anxious or depressed. Affect is not angry or tearful.        Speech: Speech normal.        Behavior: Behavior is cooperative.        Thought Content: Thought content normal. Thought content is not paranoid or delusional. Thought content does not include homicidal or suicidal ideation. Thought content does not include homicidal or suicidal plan.        Cognition and Memory: Cognition and memory normal.        Judgment: Judgment normal.     Comments: Insight chronically fair to poor. Mildly irritable on the phone but this is better than her usual baseline.     Lab Review:     Component Value Date/Time   NA 134 (L) 12/20/2017 0403   NA 142 11/01/2015 1232   K 4.6 12/20/2017 0403   CL 97 (L) 12/20/2017 0403   CO2 25 12/20/2017 0403   GLUCOSE 149 (H) 12/20/2017 0403   BUN 17 12/20/2017 0403   BUN 29 (H) 11/01/2015 1232   CREATININE 0.91 12/20/2017 0403   CALCIUM 8.6 (L) 12/20/2017 0403   PROT 6.5 11/01/2015  1232   ALBUMIN 4.5 11/01/2015 1232   AST 28 11/01/2015 1232   ALT 37 (H) 11/01/2015 1232   ALKPHOS 91 11/01/2015 1232   BILITOT 0.3 11/01/2015 1232   GFRNONAA >60 12/20/2017 0403   GFRAA >60 12/20/2017 0403       Component Value Date/Time   WBC 12.1 (H) 12/20/2017 0403   RBC 4.26  12/20/2017 0403   HGB 13.0 12/20/2017 0403   HGB 12.1 11/01/2015 1232   HCT 39.7 12/20/2017 0403   HCT 38.0 11/01/2015 1232   PLT 243 12/20/2017 0403   PLT 281 11/01/2015 1232   MCV 93.2 12/20/2017 0403   MCV 95 11/01/2015 1232   MCH 30.5 12/20/2017 0403   MCHC 32.7 12/20/2017 0403   RDW 13.1 12/20/2017 0403   RDW 13.6 11/01/2015 1232   LYMPHSABS 2.7 11/05/2015 1503   LYMPHSABS 1.8 11/01/2015 1232   MONOABS 0.6 11/05/2015 1503   EOSABS 0.2 11/05/2015 1503   EOSABS 0.0 11/01/2015 1232   BASOSABS 0.0 11/05/2015 1503   BASOSABS 0.0 11/01/2015 1232    No results found for: POCLITH, LITHIUM   Lab Results  Component Value Date   VALPROATE 113 (H) 05/27/2014     .res Assessment: Plan:    Carlinda was seen today for follow-up.  Diagnoses and all orders for this visit:  Bipolar disorder, in partial remission, most recent episode hypomanic ([place]) -     QUEtiapine (SEROQUEL) 300 MG tablet; Take 0.5 tablets (150 mg total) by mouth at bedtime.  Generalized anxiety disorder  Insomnia due to mental condition  Compliance poor  Mortimer Fries has a long history of bipolar disorder.  She has poor insight into the diagnosis and is never truly accepted the diagnosis.  She is tended to be chronically hypomanic and irritable leading to relationship problems.  She has been fairly sensitive to medications and has frequently change meds are all on her own or stop meds on her own and gone for long periods of time without keeping appointments.  This is complicated by she also does not tend to recognize she is hypomanic or manic.  Repeated efforts have been made to educate her about by her bipolar disorder to no avail.  She has had a tendency to develop side effects from antipsychotics easily.  She has refused lithium trials.  Other meds as noted above.  She has refused recommended dosages of Seroquel as an adequate mood stabilizer but claims that she has had any have adequate response from the current very  small dose of Seroquel 75 mg nightly.  She is pleased that it helps her sleep.  This is really the only reason that she agrees to stay on Seroquel.  She may be getting some mild mood stabilizing effect out of it.  Discussed potential metabolic side effects associated with atypical antipsychotics, as well as potential risk for movement side effects. Advised pt to contact office if movement side effects occur.   Because of chronic noncompliance keeping appointments will follow-up in 1 year.  I would prefer more frequent follow-up but she refuses that.  Instructed her to call if she has exacerbation of symptoms sooner.  Her husband is marginally insightful about her bipolar disorder.  Lynder Parents, MD, DFAPA  Please see After Visit Summary for patient specific instructions.  No future appointments.  No orders of the defined types were placed in this encounter.     -------------------------------

## 2019-05-18 ENCOUNTER — Other Ambulatory Visit: Payer: Self-pay

## 2019-05-18 MED ORDER — PROPRANOLOL HCL 10 MG PO TABS: 10.0000 mg | ORAL_TABLET | Freq: Two times a day (BID) | ORAL | 0 refills | Status: DC | PRN

## 2019-06-29 ENCOUNTER — Telehealth: Payer: Self-pay | Admitting: Neurology

## 2019-06-29 NOTE — Telephone Encounter (Signed)
Lvm asking for call back so we could discuss scheduling a f/u visit with Dr. Jannifer Franklin.

## 2019-06-29 NOTE — Telephone Encounter (Signed)
Noted thank you

## 2019-06-29 NOTE — Telephone Encounter (Signed)
Pt returned call, appt has been scheduled

## 2019-06-29 NOTE — Telephone Encounter (Signed)
Pt called in stating that [facility] sent her a referral / and advised her to call and schedule an appt with Dr Jannifer Franklin, no referral was placed, pt then stated she is a current patient with Dr Jannifer Franklin and has had a headache for 3 weeks and needs an appt. ( Pt has canceled last 3 appts) , advised pt of first avail with Dr Jannifer Franklin which is in February, Pt begin yelling stating " I will be dead by then " phone call got disconnected reached back out to patient to schedule with a NP, she still began to yell " everything is a minute" I advised patient I was pulling all the NP schedules to see who I can get her in with faster she then stated " I dont need you to tell me what your doing im sick and I want to get back in bed" pt then stated " call me back when you can figure out something" pt hung up phone, appt was not scheduled.

## 2019-06-30 NOTE — Telephone Encounter (Signed)
Noted  

## 2019-06-30 NOTE — Telephone Encounter (Signed)
Pt called back and stated that she was not sure when her appt was. Pt was quite rude and complained and cursed about check in policies and then hung up on me.

## 2019-07-05 ENCOUNTER — Ambulatory Visit (INDEPENDENT_AMBULATORY_CARE_PROVIDER_SITE_OTHER): Payer: Medicare Other | Admitting: Neurology

## 2019-07-05 ENCOUNTER — Other Ambulatory Visit: Payer: Self-pay

## 2019-07-05 ENCOUNTER — Encounter: Payer: Self-pay | Admitting: Neurology

## 2019-07-05 VITALS — BP 138/90 | HR 94 | Temp 98.0°F | Wt 207.0 lb

## 2019-07-05 DIAGNOSIS — G43719 Chronic migraine without aura, intractable, without status migrainosus: Secondary | ICD-10-CM

## 2019-07-05 MED ORDER — AMITRIPTYLINE HCL 10 MG PO TABS: ORAL_TABLET | ORAL | 3 refills | Status: DC

## 2019-07-05 NOTE — Progress Notes (Signed)
Reason for visit: Headache  Kristin Gilmore is an 68 y.o. female  History of present illness:  Kristin Gilmore is a 68 year old right-handed white female with a history of headaches.  The patient was last seen in July 2019, she was placed on amitriptyline at that time, she indicates that her headaches disappeared completely and she went off the medication.  She has done quite well until around Labor Day of this year when for some reason the headaches started back again.  The patient notes that the headaches are bitemporal in nature and are daily.  The patient denies any neck pain or neck stiffness which may have pain into the ears.  She also reports that she gets up frequently at night to urinate, but she gets back to sleep easily.  She occasionally will have stomach pains and takes dicyclomine for this.  She returns to the office today for an evaluation.  Past Medical History:  Diagnosis Date  . Arthritis   . Depression   . Dyslipidemia   . Hypertension   . Migraine   . Migraine without aura, without mention of intractable migraine without mention of status migrainosus 04/13/2014  . Pituitary microadenoma ([place]) 04/13/2014  . Pre-diabetes    "i was told i was pre-diabetic a while ago"  . Tremor 04/13/2014    Past Surgical History:  Procedure Laterality Date  . CHOLECYSTECTOMY    . COLONOSCOPY W/ POLYPECTOMY    . LUMBAR LAMINECTOMY/DECOMPRESSION MICRODISCECTOMY N/A 12/19/2017   Procedure: LAMINECTOMY LUMBAR TWO- LUMBAR THREE, LUMBAR THREE- LUMBAR FOUR, LUMBAR FOUR- LUMBAR FIVE ;  Surgeon: Consuella Lose, MD;  Location: [facility];  Service: Neurosurgery;  Laterality: N/A;  . NASAL SINUS SURGERY    . TONSILLECTOMY    . WISDOM TOOTH EXTRACTION      Family History  Problem Relation Age of Onset  . Cancer Father        stomach cancer  . Migraines Mother     Social history:  reports that she has never smoked. She has never used smokeless tobacco. She reports current alcohol  use. She reports that she does not use drugs.    Allergies  Allergen Reactions  . Amoxicillin Hives    UNSPECIFIED REACTION  Has patient had a PCN reaction causing immediate rash, facial/tongue/throat swelling, SOB or lightheadedness with hypotension: Unknown Has patient had a PCN reaction causing severe rash involving mucus membranes or skin necrosis: Unknown Has patient had a PCN reaction that required hospitalization: Unknown Has patient had a PCN reaction occurring within the last 10 years: No If all of the above answers are "NO", then may proceed with Cephalosporin use.   . Clarithromycin Hives, Other (See Comments) and Rash  . Atropine Hives    Medications:  Prior to Admission medications   Medication Sig Start Date End Date Taking? Authorizing Provider  acetaminophen (TYLENOL) 500 MG tablet Take 1,000 mg by mouth 2 (two) times daily.    Yes [provider]  amitriptyline (ELAVIL) 10 MG tablet Take one tablet at night for one week, then take 2 tablets at night for one week, then take 3 tablets at night. 07/05/19  Yes Kathrynn Ducking, MD  amLODipine (NORVASC) 5 MG tablet amlodipine 5 mg tablet  TAKE 1 TABLET BY MOUTH TWICE A DAY 07/27/18  Yes [provider]  Calcium Citrate (CAL-CITRATE PO) Take 1-2 tablets by mouth 2 (two) times daily. TAKE 1 TABLET IN THE MORNING & TAKE 2 TABLETS WITH SUPPER  Yes [provider]  Cholecalciferol (VITAMIN D3) 2000 UNITS TABS Take 2,000 Units by mouth daily with supper.    Yes [provider]  Coenzyme Q10 (COQ10 PO) Take 100 mg by mouth daily with supper. H-Eq10 (CoQ10 with Hemp)   Yes [provider]  Coenzyme Q10 (COQ10) 100 MG CAPS Take 100 mg by mouth daily with supper.   Yes [provider]  diclofenac (VOLTAREN) 75 MG EC tablet diclofenac sodium 75 mg tablet,delayed release  Take 1 tablet twice a day by oral route with meals.   Yes [provider]  dicyclomine (BENTYL) 20 MG  tablet Take 20 mg by mouth 2 (two) times daily as needed for spasms.   Yes [provider]  diphenhydrAMINE (BENADRYL) 25 MG tablet Take 25 mg by mouth every 6 (six) hours as needed.   Yes [provider]  fluticasone (FLONASE) 50 MCG/ACT nasal spray Place 1 spray into both nostrils daily.  01/18/17  Yes [provider]  Glucosamine-Chondroitin (COSAMIN DS PO) Take 3 capsules by mouth daily. 1500 glucosamine   Yes [provider]  hydrochlorothiazide (HYDRODIURIL) 25 MG tablet Take 25 mg by mouth daily. 04/10/19  Yes [provider]  levofloxacin (LEVAQUIN) 500 MG tablet TAKE 1 TABLET BY MOUTH EVERY DAY FOR 10 DAYS 06/29/19  Yes [provider]  loperamide (IMODIUM) 2 MG capsule Take 2-4 mg by mouth 2 (two) times daily as needed for diarrhea or loose stools.   Yes [provider]  loratadine (CLARITIN) 10 MG tablet Take 10 mg by mouth daily.   Yes [provider]  MAGNESIUM SULFATE PO Take 1 tablet by mouth at bedtime.    Yes [provider]  Omega 3 1000 MG CAPS Take 1,000 mg by mouth 2 (two) times daily. SUPER OMEGA-3   Yes [provider]  OVER THE COUNTER MEDICATION Take 2 capsules by mouth daily with supper. Quick 6 Weight Loss Supplement   Yes [provider]  pantoprazole (PROTONIX) 40 MG tablet pantoprazole 40 mg tablet,delayed release 06/18/19  Yes [provider]  polyethylene glycol (MIRALAX / GLYCOLAX) packet Take 17 g by mouth daily. 12/23/17  Yes Consuella Lose, MD  Probiotic Product (PROBIOTIC PO) Take 1 capsule by mouth daily. SUPREMA DOPHILUS 5 BILLION CFU   Yes [provider]  propranolol (INDERAL) 10 MG tablet Take 1 tablet (10 mg total) by mouth 2 (two) times daily as needed (for hand tremors). 05/18/19  Yes Cottle, Billey Co., MD  QUEtiapine (SEROQUEL) 300 MG tablet Take 0.5 tablets (150 mg total) by mouth at bedtime. Patient taking differently: Take by mouth at  bedtime. Take 1/4 tab 04/28/19  Yes Cottle, Billey Co., MD  Red Yeast Rice 600 MG CAPS Take 1,200 mg by mouth 2 (two) times daily.   Yes [provider]  ROYAL JELLY PO Take 150 mg by mouth daily.   Yes [provider]  telmisartan (MICARDIS) 80 MG tablet telmisartan 80 mg tablet   Yes [provider]  tiZANidine (ZANAFLEX) 4 MG tablet TAKE 1 TABLET (4 MG TOTAL) BY MOUTH EVERY 6 (SIX) HOURS AS NEEDED FOR UP TO 10 DAYS. 04/16/19  Yes [provider]  tolterodine (DETROL) 2 MG tablet Take by mouth. 05/19/19  Yes [provider]  TURMERIC PO Take 475 mg by mouth 2 (two) times daily. WITH FOOD   Yes [provider]  vitamin E 400 UNIT capsule Take 400 Units by mouth daily with supper.  Yes [provider]    ROS:  Out of a complete 14 system review of symptoms, the patient complains only of the following symptoms, and all other reviewed systems are negative.  Frequent headache Stomach cramps Tremor  Blood pressure 138/90, pulse 94, temperature 98 F (36.7 C), temperature source Temporal, weight 207 lb (93.9 kg), SpO2 (!) 83 %.  Physical Exam  General: The patient is alert and cooperative at the time of the examination.  The patient is markedly obese.  Skin: No significant peripheral edema is noted.   Neurologic Exam  Mental status: The patient is alert and oriented x 3 at the time of the examination. The patient has apparent normal recent and remote memory, with an apparently normal attention span and concentration ability.   Cranial nerves: Facial symmetry is present. Speech is normal, no aphasia or dysarthria is noted. Extraocular movements are full. Visual fields are full.  Motor: The patient has good strength in all 4 extremities.  Sensory examination: Soft touch sensation is symmetric on the face, arms, and legs.  Coordination: The patient has good finger-nose-finger and heel-to-shin bilaterally.  The patient has a  prominent resting tremor involving the left upper extremity.  Gait and station: The patient has a normal gait.  With walking however, the patient has some decreased arm swing on the left, tremors noted on the left arm with walking.  Tandem gait is normal. Romberg is negative. No drift is seen.  Reflexes: Deep tendon reflexes are symmetric.   Assessment/Plan:  1.  Chronic daily headache  2.  Left arm tremor, possible Parkinson's disease  The patient will be started back on amitriptyline, this seemed to help her headache previously.  She is already on a beta-blocker and she takes Seroquel at night.  If the amitriptyline is not effective, she will call for dosage adjustments.  She will follow-up otherwise in 3 months.  Jill Alexanders MD 07/05/2019 4:34 PM  [facility] [address] [place] [employer], [place] [address]  Phone [phone] Fax [phone]

## 2019-07-05 NOTE — Patient Instructions (Signed)
We will start amitriptyline for the headache.  Elavil (amitriptyline) is an antidepressant medication that has many uses that may include headache, whiplash injuries, or for peripheral neuropathy pain. Side effects may include drowsiness, dry mouth, blurred vision, or constipation. As with any antidepressant medication, worsening depression may occur. If you had any significant side effects, please call our office. The full effects of this medication may take 7-10 days after starting the drug, or going up on the dose.

## 2019-07-16 ENCOUNTER — Other Ambulatory Visit: Payer: Self-pay

## 2019-07-16 ENCOUNTER — Other Ambulatory Visit: Payer: Self-pay | Admitting: Family Medicine

## 2019-07-16 ENCOUNTER — Ambulatory Visit
Admission: RE | Admit: 2019-07-16 | Discharge: 2019-07-16 | Disposition: A | Discharge status: Home / Self Care | Payer: Medicare Other | Source: Ambulatory Visit | Attending: Family Medicine | Admitting: Family Medicine

## 2019-07-16 DIAGNOSIS — R109 Unspecified abdominal pain: Secondary | ICD-10-CM

## 2019-07-16 DIAGNOSIS — R102 Pelvic and perineal pain: Secondary | ICD-10-CM

## 2019-07-16 IMAGING — CR DG PELVIS 1-2V
1 series · 1 of 1 positions shown · non-contrast
Comparison: Pelvic CT 08/25/2018

CLINICAL DATA: Abdominopelvic pain for 1 week, bilateral groin
pain, fall July 2018

EXAM:
PELVIS - 1-2 VIEW

[w pelvis upright]
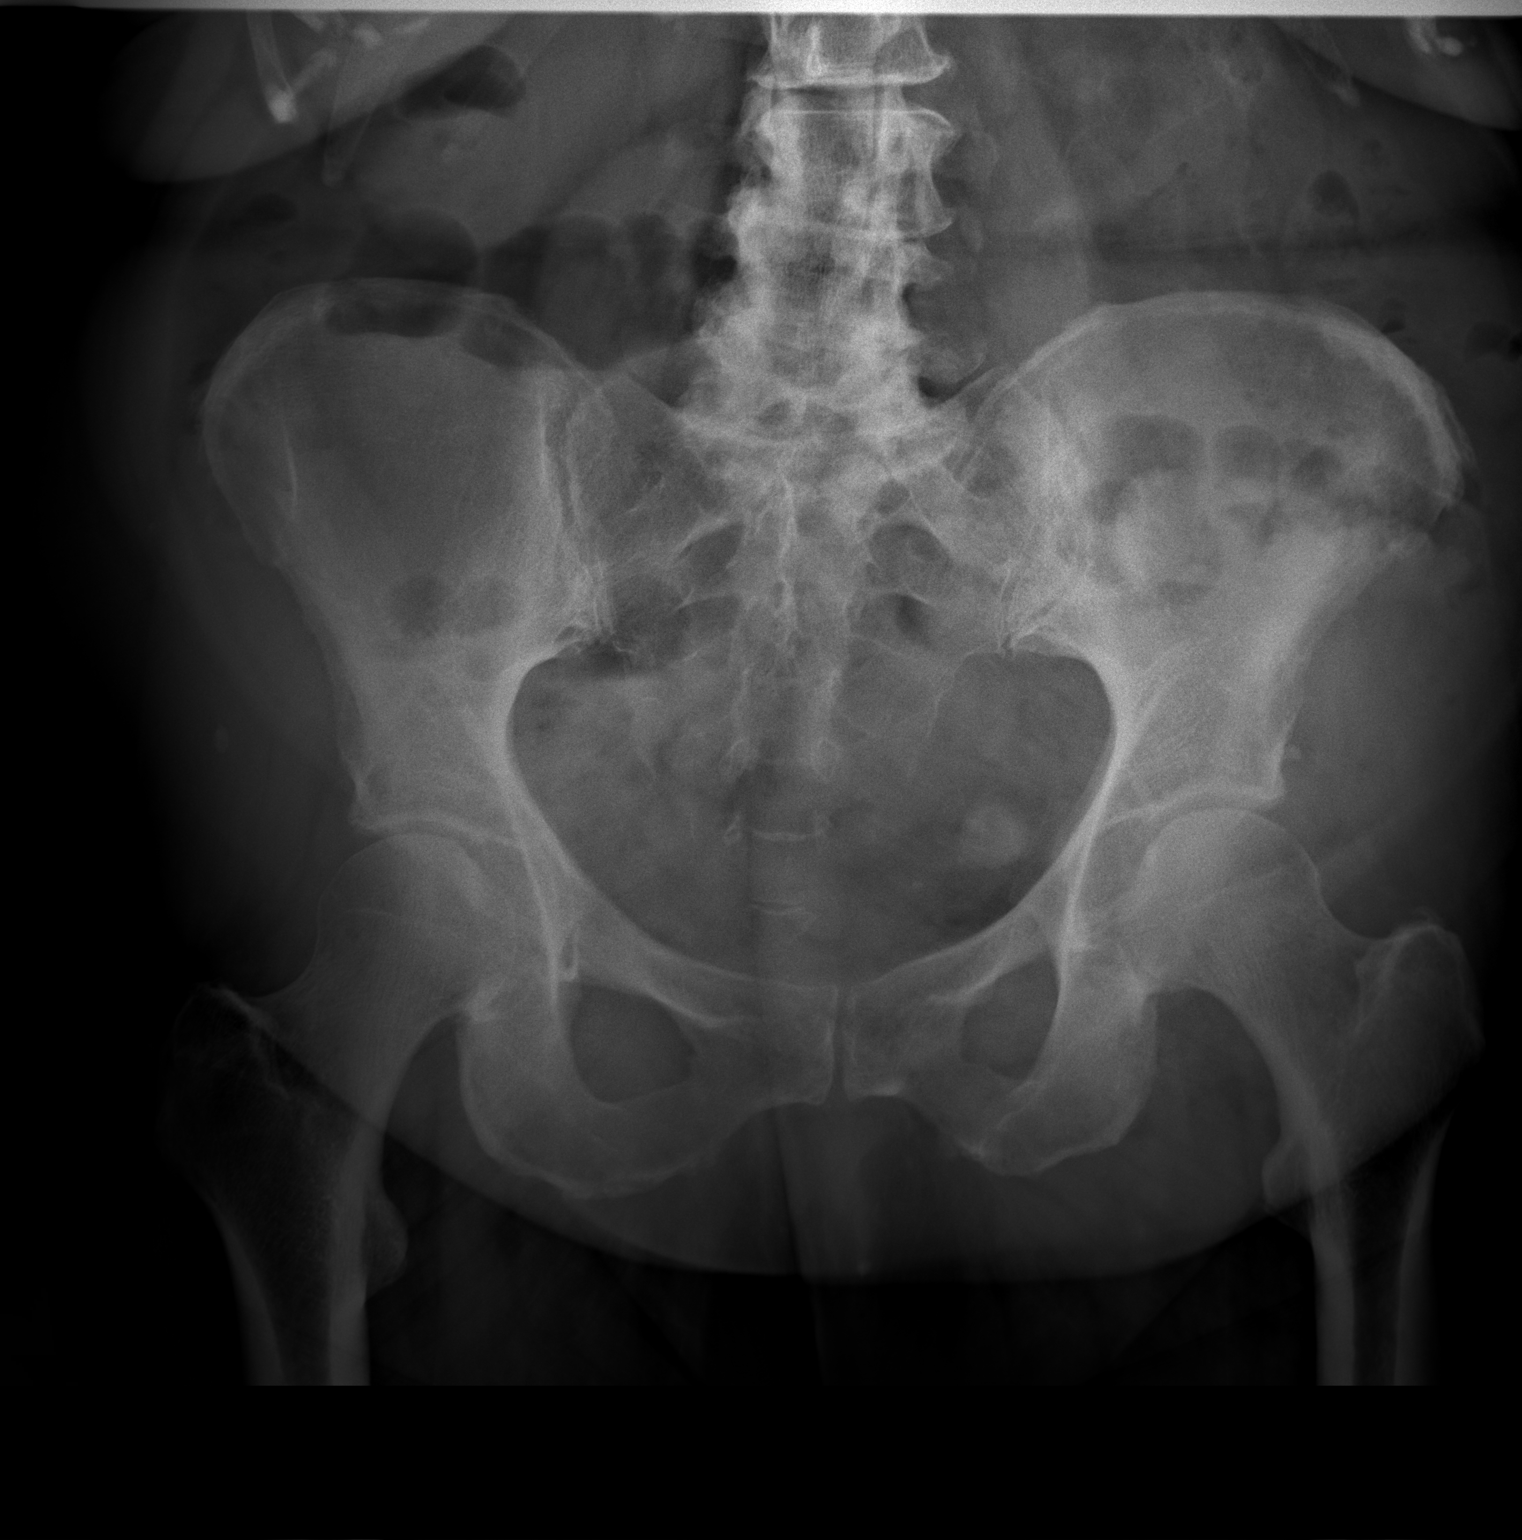

[1 of 1 positions shown; findings below may reference images not displayed]

FINDINGS: No evidence of acute or healing fracture. No pelvic diastasis.
Degenerative changes are noted in the lower lumbar spine and
lumbosacral junction as well as at both hips and the SI joints. No
suspicious osseous lesions. Bowel gas pattern is unremarkable.
Coarse calcification in the left hemipelvis, could reflect a
partially calcified fibroid or ingested material.
IMPRESSION: 1. No evidence of acute or healing fracture. No traumatic
malalignment.
2. Degenerative changes in the lower lumbar spine and pelvis.

## 2019-07-16 IMAGING — CR DG ABDOMEN 2V
2 series · 2 of 2 positions shown · non-contrast
Comparison: None.

CLINICAL DATA: Abdominopelvic pain, bloating for 1 week

EXAM:
ABDOMEN - 2 VIEW

[w abdomen upright]
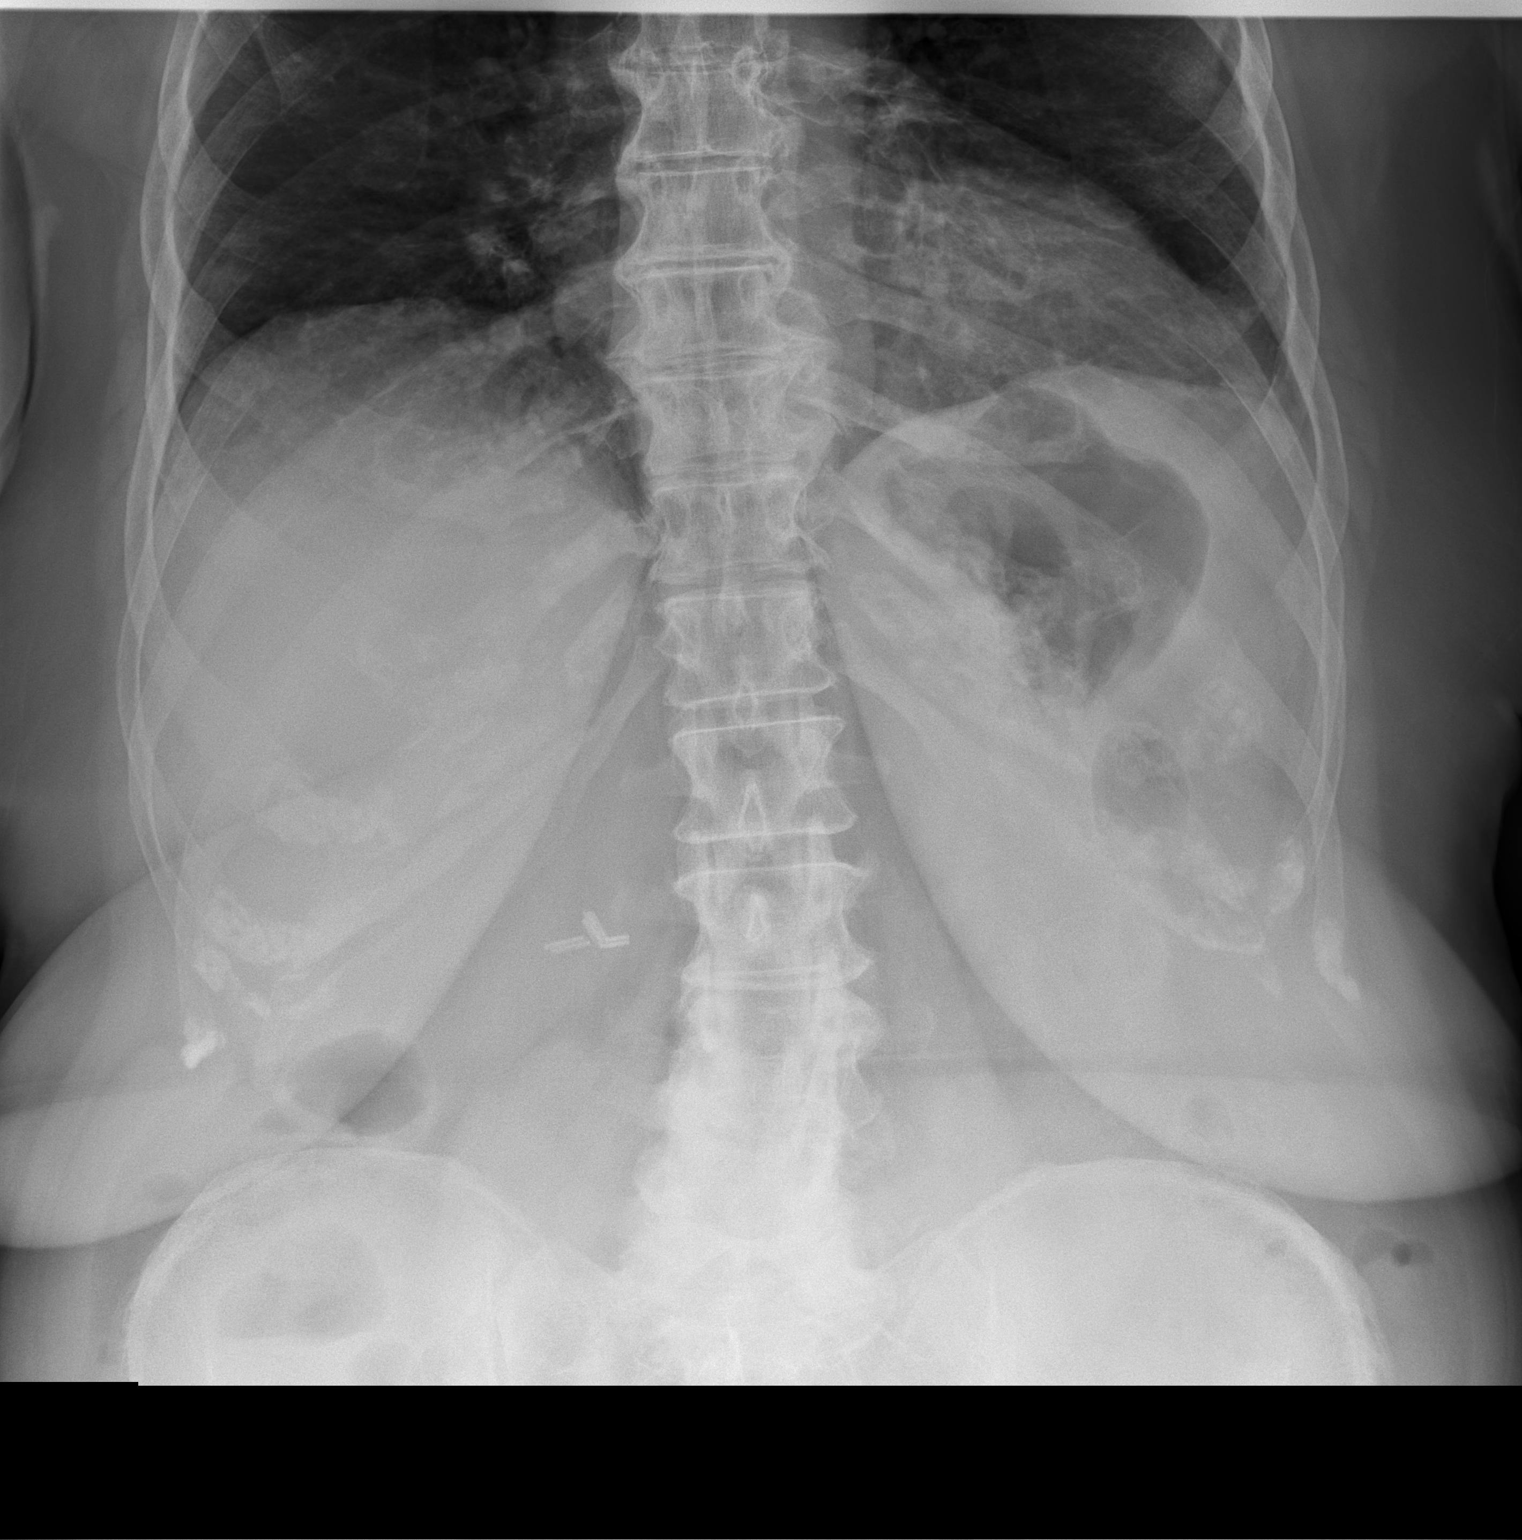

[t abdomen supine]
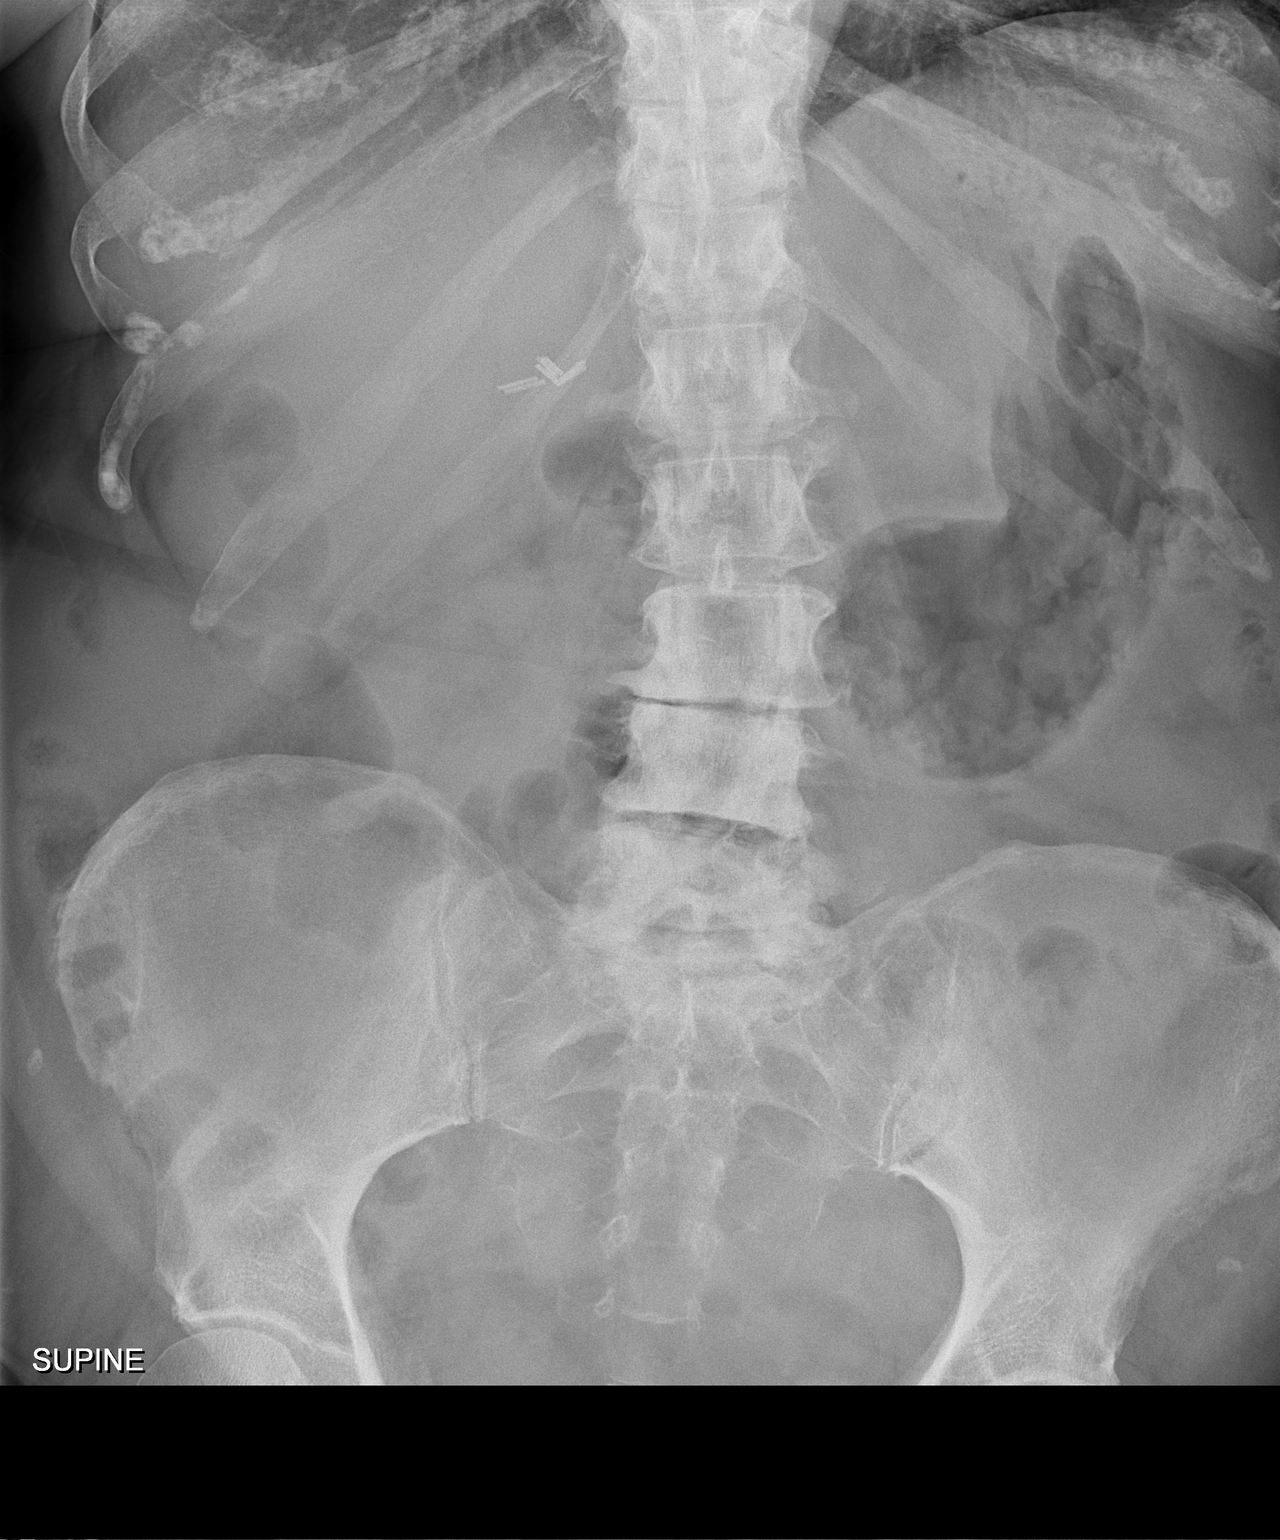

[2 of 2 positions shown; findings below may reference images not displayed]

FINDINGS: The bowel gas pattern is normal. There is no evidence of free air.
Patient is post cholecystectomy clips in the right upper quadrant.
No suspicious calcifications overlying the renal shadows with course
of either ureter. Degenerative changes are noted in the spine and
pelvis.
IMPRESSION: No high-grade obstructive bowel gas pattern.

No free air.

## 2019-07-20 ENCOUNTER — Other Ambulatory Visit (HOSPITAL_COMMUNITY): Payer: Self-pay | Admitting: Gastroenterology

## 2019-07-20 ENCOUNTER — Other Ambulatory Visit: Payer: Self-pay | Admitting: Gastroenterology

## 2019-07-20 ENCOUNTER — Other Ambulatory Visit: Payer: Self-pay

## 2019-07-20 ENCOUNTER — Ambulatory Visit (HOSPITAL_COMMUNITY)
Admission: RE | Admit: 2019-07-20 | Discharge: 2019-07-20 | Disposition: A | Discharge status: Home / Self Care | Payer: Medicare Other | Source: Ambulatory Visit | Attending: Gastroenterology | Admitting: Gastroenterology

## 2019-07-20 DIAGNOSIS — R1032 Left lower quadrant pain: Secondary | ICD-10-CM

## 2019-07-20 IMAGING — CT CT ABD-PELV W/ CM
2 of 5 series · 16 of 46 positions shown, 18 images · IV contrast (omnipaque)
Comparison: CT scan dated 05/31/2015

CLINICAL DATA: Left lower quadrant abdominal pain.

EXAM:
CT ABDOMEN AND PELVIS WITH CONTRAST
TECHNIQUE: Multidetector CT imaging of the abdomen and pelvis was performed
using the standard protocol following bolus administration of
intravenous contrast.
CONTRAST:  80mL OMNIPAQUE IOHEXOL 300 MG/ML SOLN, 30mL OMNIPAQUE
IOHEXOL 300 MG/ML SOLN

[Series 2: axial st · axial · 0.89mm/px · z∈[-610,-225]mm · 13 of 91 slices shown, 15 images]
[im 7/91  soft-tissue]
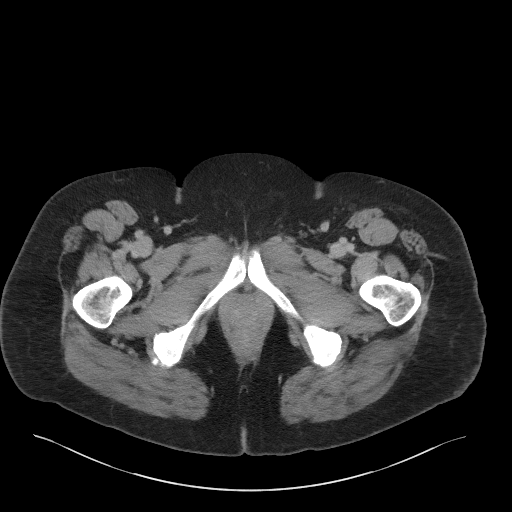
[im 7/91  bone]
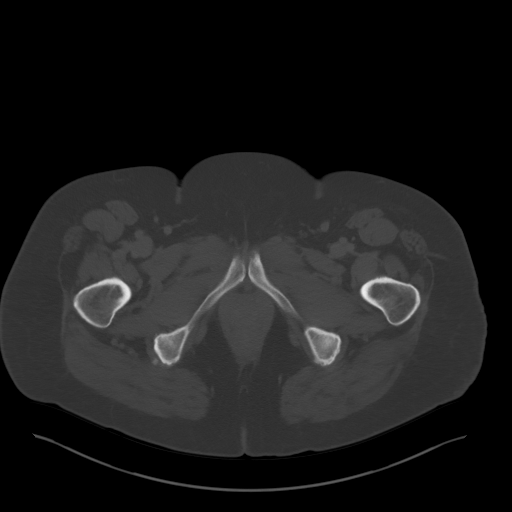
[im 13/91  soft-tissue]
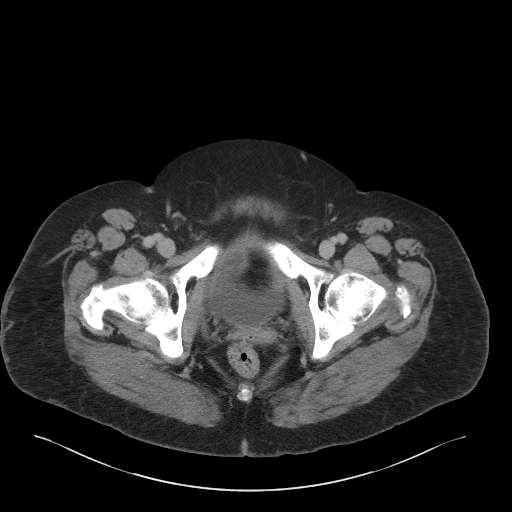
[im 20/91  soft-tissue]
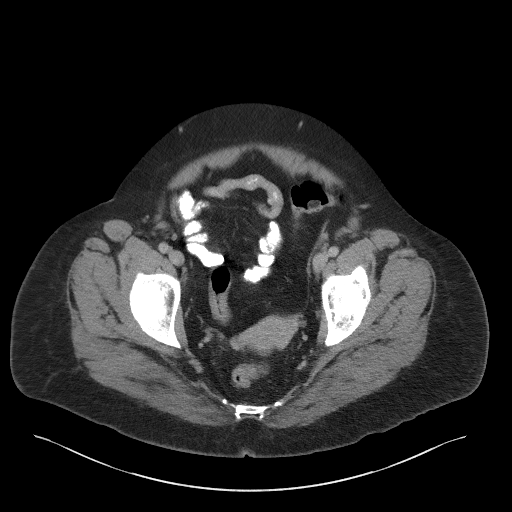
[im 26/91  soft-tissue]
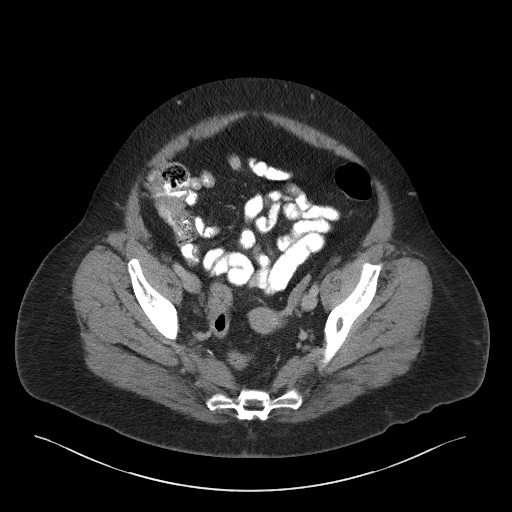
[im 33/91  soft-tissue]
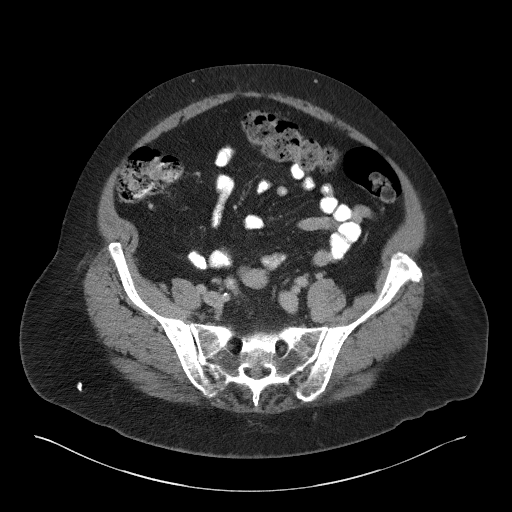
[im 39/91  soft-tissue]
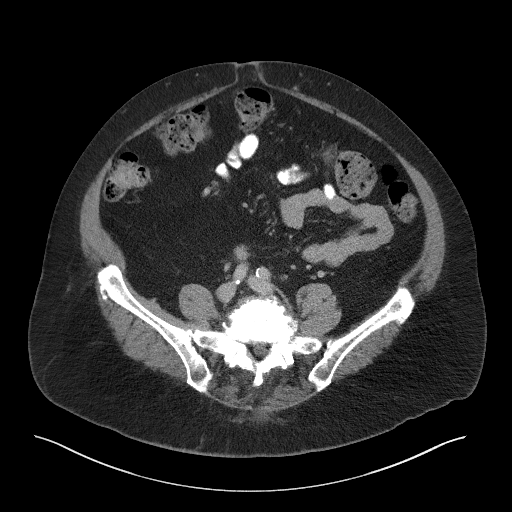
[im 46/91  soft-tissue]
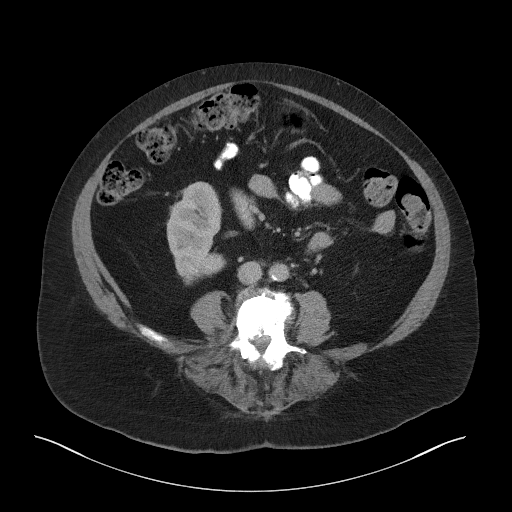
[im 52/91  soft-tissue]
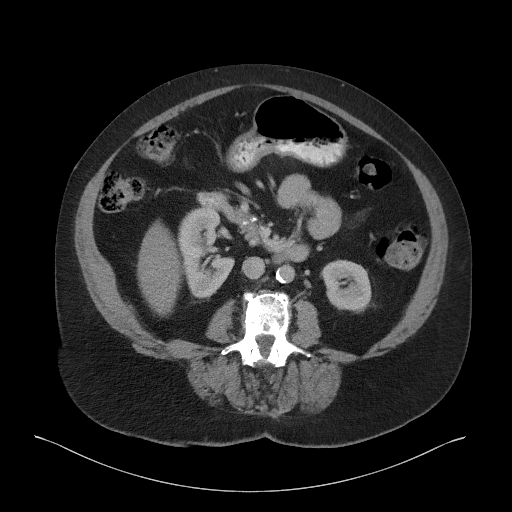
[im 58/91  soft-tissue]
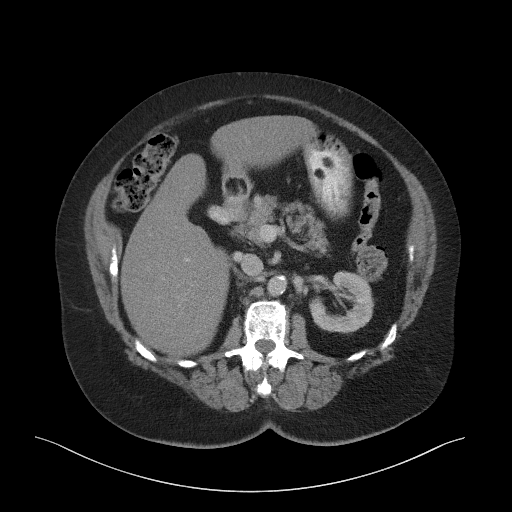
[im 58/91  bone]
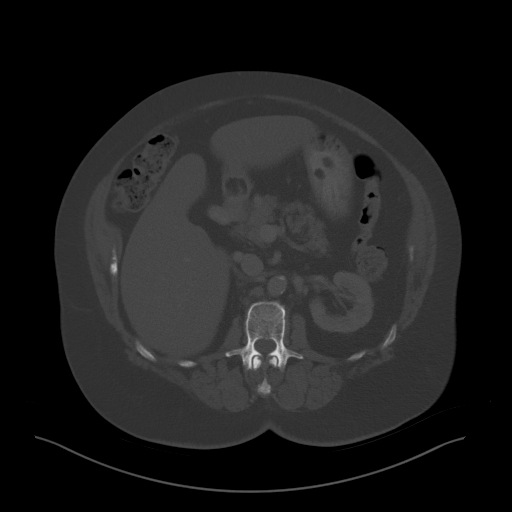
[im 65/91  soft-tissue]
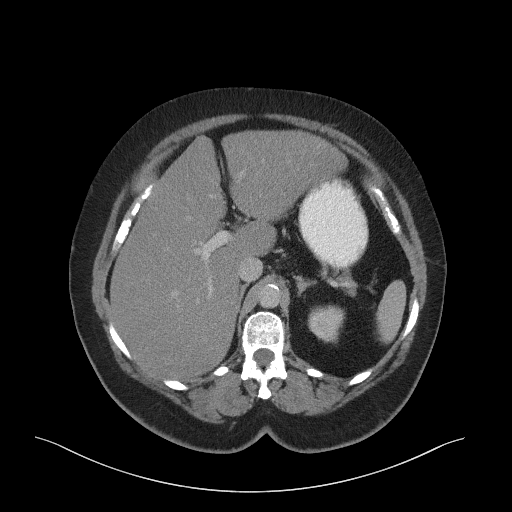
[im 71/91  soft-tissue]
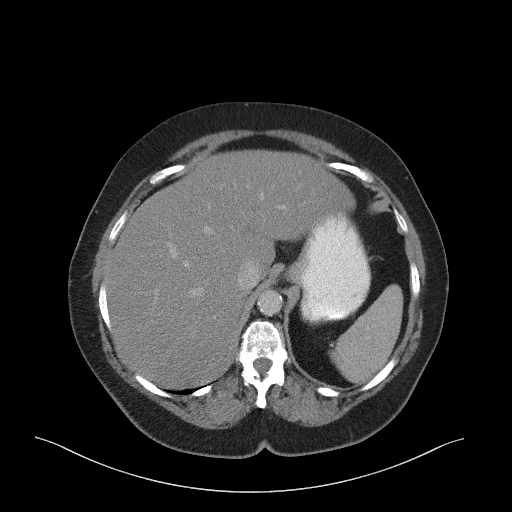
[im 78/91  soft-tissue]
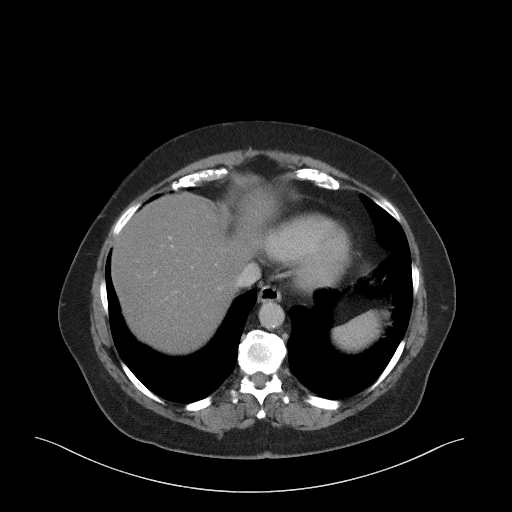
[im 84/91  soft-tissue]
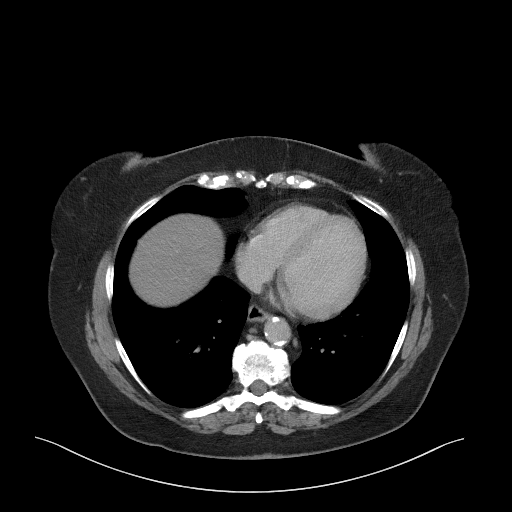

[Series 5: coronal st · coronal · 0.76mm/px · 3 of 118 slices shown]
[im 40/118  soft-tissue]
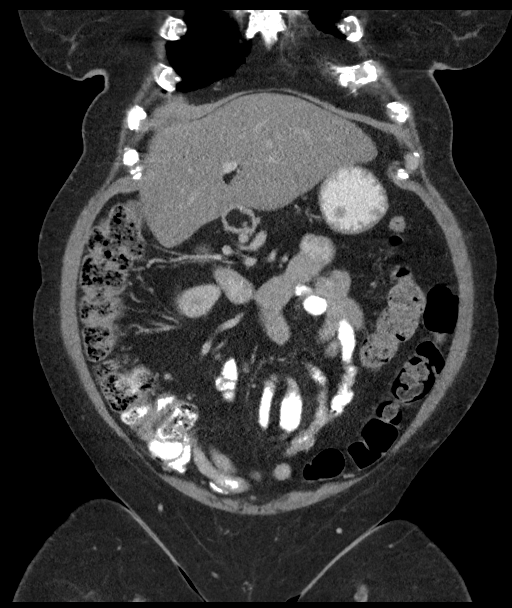
[im 53/118  soft-tissue]
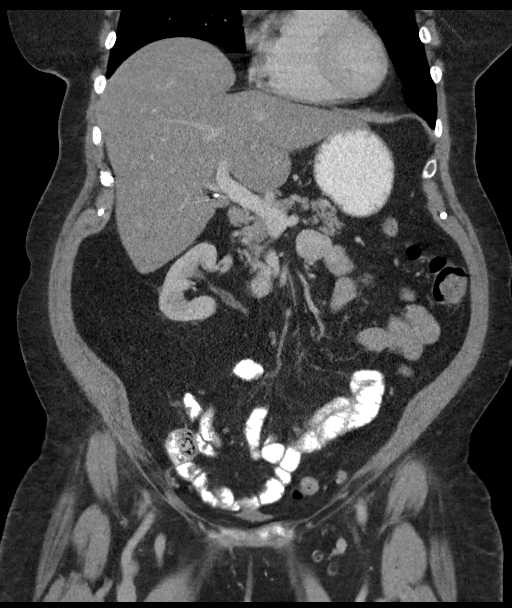
[im 66/118  soft-tissue]
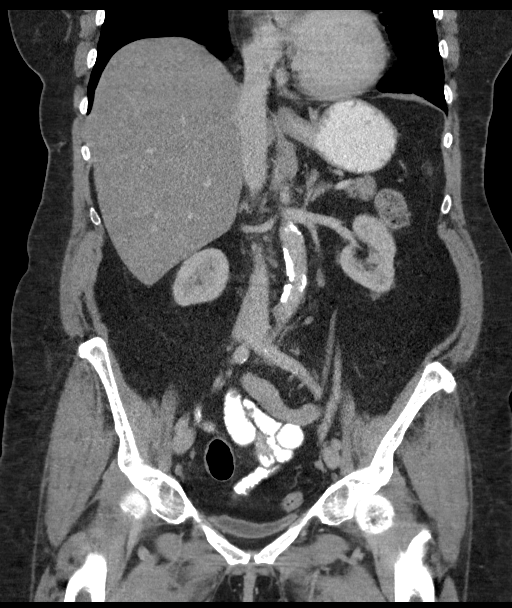

[16 of 46 positions shown; findings below may reference images not displayed]

FINDINGS: Lower chest: No acute abnormality.

Hepatobiliary: Increased hepatomegaly with diffuse hepatic
steatosis. No focal lesions. Cholecystectomy. No dilated bile ducts.

Pancreas: Unremarkable. No pancreatic ductal dilatation or
surrounding inflammatory changes.

Spleen: Normal in size without focal abnormality.

Adrenals/Urinary Tract: Adrenal glands are normal. Stable tiny cyst
in the upper pole of the left kidney. The right kidney is normal
except that is pushed inferiorly due to the hepatomegaly. No
hydronephrosis. Bladder is normal.

Stomach/Bowel: Stomach is within normal limits. Appendix appears
normal. No evidence of bowel wall thickening, distention, or
inflammatory changes. Specifically, no evidence of diverticulosis or
diverticulitis.

Vascular/Lymphatic: Aortic atherosclerosis. No enlarged abdominal or
pelvic lymph nodes.

Reproductive: Uterus and bilateral adnexa are unremarkable.

Other: No abdominal wall hernia or abnormality. No abdominopelvic
ascites.

Musculoskeletal: Large calcified disc protrusion at L4-5 which
compresses the thecal sac despite the posterior decompression.
Severe chronic degenerative disc disease at L3-4 through L5-S1.
Previous posterior decompression at L3-4 and L4-5.
IMPRESSION: 1. No acute abnormalities of the abdomen or pelvis. Specifically, no
evidence of diverticulosis or diverticulitis.
2. Increased hepatomegaly with diffuse hepatic steatosis.
3. Large calcified chronic disc protrusion at L4-5 as described
above.

Aortic Atherosclerosis ([id number]-5J6.6).

## 2019-07-20 MED ORDER — SODIUM CHLORIDE (PF) 0.9 % IJ SOLN
INTRAMUSCULAR | Status: AC
  Filled 2019-07-20: qty 50

## 2019-07-20 MED ORDER — IOHEXOL 300 MG/ML  SOLN
30.0000 mL | Freq: Once | INTRAMUSCULAR | Status: AC | PRN
  Administered 2019-07-20: 16:00:00 30 mL via ORAL

## 2019-07-20 MED ORDER — IOHEXOL 300 MG/ML  SOLN
100.0000 mL | Freq: Once | INTRAMUSCULAR | Status: AC | PRN
  Administered 2019-07-20: 80 mL via INTRAVENOUS

## 2019-07-26 ENCOUNTER — Other Ambulatory Visit: Payer: Self-pay | Admitting: Gastroenterology

## 2019-07-26 DIAGNOSIS — R140 Abdominal distension (gaseous): Secondary | ICD-10-CM

## 2019-07-27 ENCOUNTER — Telehealth: Payer: Self-pay | Admitting: Neurology

## 2019-07-27 MED ORDER — AMITRIPTYLINE HCL 25 MG PO TABS: ORAL_TABLET | ORAL | 3 refills | Status: DC

## 2019-07-27 MED ORDER — KETOROLAC TROMETHAMINE 10 MG PO TABS: 10.0000 mg | ORAL_TABLET | Freq: Three times a day (TID) | ORAL | 1 refills | Status: DC | PRN

## 2019-07-27 NOTE — Telephone Encounter (Signed)
I called the patient.  The patient continues to have daily headaches, she has a lightheaded sensation with a headache, no true vertigo or nausea.  She is on 30 mg at night of the amitriptyline, she tolerates the drug but it has not helped the headaches.  We will increase the medication to 50 mg at night and after a week go to 75 at night.  She will call if she is not doing well.

## 2019-07-27 NOTE — Addendum Note (Signed)
Addended by: Kathrynn Ducking on: 07/27/2019 12:55 PM   Modules accepted: Orders

## 2019-07-27 NOTE — Telephone Encounter (Addendum)
Pt is requesting a call back she states she forgot to ask Dr Jannifer Franklin something

## 2019-07-27 NOTE — Addendum Note (Signed)
Addended by: Kathrynn Ducking on: 07/27/2019 01:49 PM   Modules accepted: Orders

## 2019-07-27 NOTE — Telephone Encounter (Signed)
I called the patient.  The patient was taken Tylenol or a migraine preparation with acetaminophen/aspirin/caffeine, these medications not helpful.  I will give her a small prescription for Toradol tablets to see if this helps.

## 2019-07-27 NOTE — Telephone Encounter (Signed)
Pt has called to report that amitriptyline (ELAVIL) 10 MG tablet doesn't help her.  Pt is asking for something else to be called in for her.  Please call

## 2019-07-28 ENCOUNTER — Other Ambulatory Visit: Payer: Self-pay | Admitting: Neurology

## 2019-07-28 ENCOUNTER — Telehealth: Payer: Self-pay | Admitting: Neurology

## 2019-07-28 MED ORDER — DICLOFENAC POTASSIUM 50 MG PO TABS: 50.0000 mg | ORAL_TABLET | Freq: Three times a day (TID) | ORAL | 1 refills | Status: DC | PRN

## 2019-07-28 NOTE — Telephone Encounter (Signed)
Pt has called stating that she has a medication at the pharmacy.  Pt states she was told that she can not get it until she gets a injection from her Dr.  Abbott Pao is asking for a response to know when she will be able to come in for the injection.  The message from [place] was relayed to her, pt asked that Jinny Blossom and Dr Jannifer Franklin be told that her head is spinning and she feels like she is on a [place] as a result of taking the 2 amitriptyline (ELAVIL) 25 MG tablet.

## 2019-07-28 NOTE — Telephone Encounter (Signed)
MD changed toradol to diclofenac potassium, but wanted to discuss taking the toradol shot anyway. I fwd message to MD.

## 2019-07-28 NOTE — Progress Notes (Signed)
The Toradol prescription could not be filled unless the patient has received IM injections of ketorolac for some reason.  I will discontinue the Toradol prescription, call in a prescription for diclofenac potassium to take if needed.

## 2019-07-28 NOTE — Telephone Encounter (Signed)
Pt called stating that her pharmacy informed her that before she gets the prescription filled that was just sent in for her she is needing a shot. She states that she is to sick to stay up so she is going back to bed but please leave a message if she is needing to schedule an appt for this shot. Please advise.

## 2019-07-28 NOTE — Telephone Encounter (Signed)
I have [facility] the [facility] in [facility]ann[facility] Franklin office regarding this. Waiting for him to review.

## 2019-07-28 NOTE — Telephone Encounter (Signed)
I called the patient.  She is still interested in getting the Toradol, I will try to get her in tomorrow for Depacon and a Toradol shot for the headache.

## 2019-07-28 NOTE — Telephone Encounter (Signed)
[facility] called and stated pt needs a Toradol shot before they can dispense tablets

## 2019-07-29 ENCOUNTER — Ambulatory Visit: Payer: Self-pay | Admitting: Neurology

## 2019-07-30 ENCOUNTER — Other Ambulatory Visit: Payer: Medicare Other

## 2019-07-30 ENCOUNTER — Telehealth: Payer: Self-pay | Admitting: Neurology

## 2019-07-30 MED ORDER — PREDNISONE 20 MG PO TABS: ORAL_TABLET | ORAL | 0 refills | Status: DC

## 2019-07-30 NOTE — Telephone Encounter (Signed)
The office is closed, and yesterdays VPA infusion didn't relief her of the pain she describes as migrainous.   I encouraged to take her preventive medications, ice and heat,   She stated she is not diabetic but borderline and we discussed using a steroid trial. She is ok with that, will eat before she takes steroids.   Sent to [place].

## 2019-07-30 NOTE — Addendum Note (Signed)
Addended by: Larey Seat on: 07/30/2019 12:42 PM   Modules accepted: Orders

## 2019-07-30 NOTE — Telephone Encounter (Signed)
Pt states she had her infusion yesterday due to her headaches, pt states she has woken up to a headache. Pt is asking for a call from On Call Dr to discuss what she can take to help ease the headache.

## 2019-08-02 MED ORDER — KETOROLAC TROMETHAMINE 10 MG PO TABS: 10.0000 mg | ORAL_TABLET | Freq: Three times a day (TID) | ORAL | 1 refills | Status: DC | PRN

## 2019-08-02 NOTE — Addendum Note (Signed)
Addended by: Kathrynn Ducking on: 08/02/2019 08:11 AM   Modules accepted: Orders

## 2019-08-02 NOTE — Telephone Encounter (Signed)
The patient has now received her IM injection of Toradol, I will send in oral prescription for this.

## 2019-08-03 ENCOUNTER — Other Ambulatory Visit: Payer: Self-pay | Admitting: Orthopedic Surgery

## 2019-08-03 DIAGNOSIS — G8929 Other chronic pain: Secondary | ICD-10-CM

## 2019-08-09 ENCOUNTER — Other Ambulatory Visit: Payer: Self-pay | Admitting: Orthopedic Surgery

## 2019-08-09 ENCOUNTER — Other Ambulatory Visit: Payer: Self-pay

## 2019-08-09 ENCOUNTER — Ambulatory Visit
Admission: RE | Admit: 2019-08-09 | Discharge: 2019-08-09 | Disposition: A | Discharge status: Home / Self Care | Payer: Medicare Other | Source: Ambulatory Visit | Attending: Orthopedic Surgery | Admitting: Orthopedic Surgery

## 2019-08-09 DIAGNOSIS — G8929 Other chronic pain: Secondary | ICD-10-CM

## 2019-08-09 IMAGING — XA DG EPIDURAL NERVE ROOT
2 series · 2 of 2 positions shown · non-contrast
Comparison: none

CLINICAL DATA: Lumbosacral spondylosis without myelopathy. Severe
left lower back and hip pain. Right anterior thigh pain. Prior L2-L5
posterior decompression.

[Series 1: ortho adipose · 1 of 1 slices shown (1 of 2)]
[im 1/1]
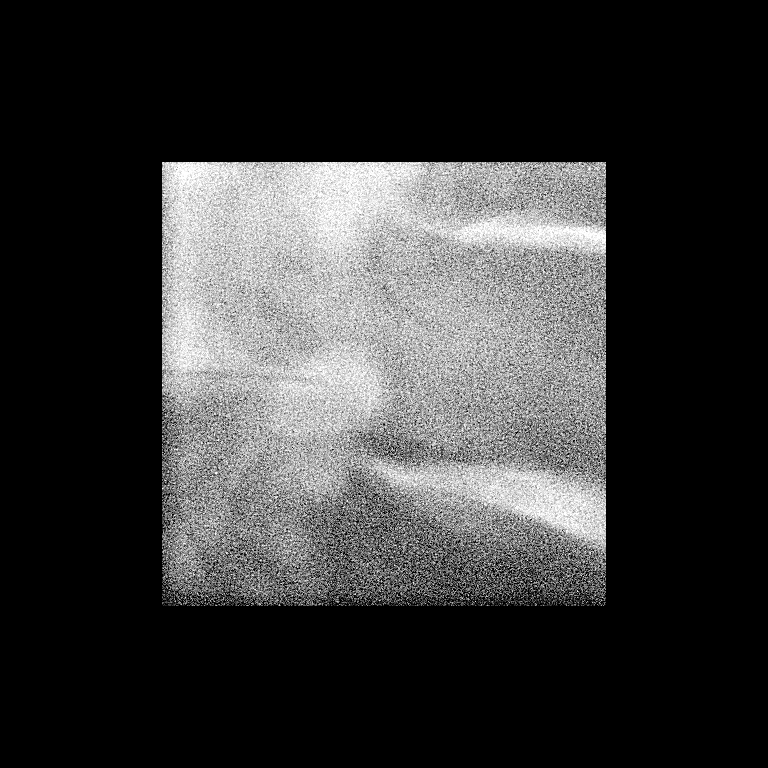

[Series 2: ortho adipose · 1 of 1 slices shown (2 of 2)]
[im 1/1]
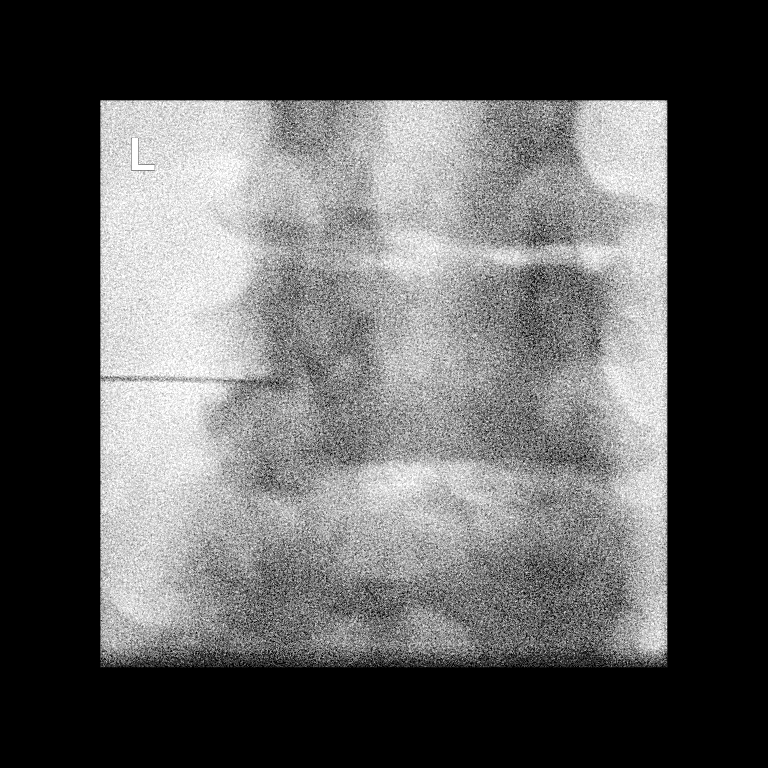

[2 of 2 positions shown; findings below may reference images not displayed]

EXAM:
EPIDURAL/NERVE ROOT

FLUOROSCOPY TIME:  Radiation Exposure Index (as provided by the
fluoroscopic device): 6.6 mGy

Fluoroscopy Time:  7 seconds

Number of Acquired Images:  0

PROCEDURE:
The procedure, risks, benefits, and alternatives were explained to
the patient. Questions regarding the procedure were encouraged and
answered. The patient understands and consents to the procedure.

LEFT L4 NERVE ROOT BLOCK AND TRANSFORAMINAL EPIDURAL: A posterior
oblique approach was taken to the intervertebral foramen on the left
at L4-L5 using a 5 inch 22 gauge spinal needle. Injection of Isovue
M 200 outlined the left L4 nerve root and showed good epidural
spread. No vascular opacification is seen. 120 mg of Depo-Medrol
mixed with 2 mL of 1% lidocaine were instilled. The procedure was
well-tolerated, and the patient was discharged thirty minutes
following the injection in good condition.

COMPLICATIONS:
None immediate.
IMPRESSION: Technically successful injection consisting of a left L4 nerve root
block and transforaminal epidural.

## 2019-08-09 MED ORDER — METHYLPREDNISOLONE ACETATE 40 MG/ML INJ SUSP (RADIOLOG: 120.0000 mg | Freq: Once | INTRAMUSCULAR | Status: DC

## 2019-08-09 MED ORDER — IOPAMIDOL (ISOVUE-M 200) INJECTION 41%: 1.0000 mL | Freq: Once | INTRAMUSCULAR | Status: DC

## 2019-08-09 NOTE — Discharge Instructions (Signed)

## 2019-08-10 ENCOUNTER — Other Ambulatory Visit: Payer: Self-pay | Admitting: Psychiatry

## 2019-08-11 ENCOUNTER — Telehealth: Payer: Self-pay

## 2019-08-11 MED ORDER — CHLORPROMAZINE HCL 50 MG PO TABS: ORAL_TABLET | ORAL | 0 refills | Status: DC

## 2019-08-11 MED ORDER — AIMOVIG 140 MG/ML ~~LOC~~ SOAJ: 140.0000 mg | SUBCUTANEOUS | 4 refills | Status: DC

## 2019-08-11 NOTE — Telephone Encounter (Signed)
Pt called in and stated she is having bad headaches and dizziness and she is having difficulty getting out of bed, she wants to know if anything can be called in to help her.

## 2019-08-11 NOTE — Addendum Note (Signed)
Addended by: Kathrynn Ducking on: 08/11/2019 01:31 PM   Modules accepted: Orders

## 2019-08-11 NOTE — Telephone Encounter (Signed)
PA has been submitted for Chlorpromazine has been submitted.  (Key: [id number])  Your information has been submitted to [place]. [employer] [place] will review the request and notify you of the determination decision directly, typically within 3 business days of your submission and once all necessary information is received.  You will also receive your request decision electronically. To check for an update later, open the request again from your dashboard.  If [employer] has not responded within the specified timeframe or if you have any questions about your PA submission, contact [place] directly at [facility]) [phone] or ([place]) [phone].

## 2019-08-11 NOTE — Telephone Encounter (Signed)
I called the patient.  The patient is having daily headaches, the amitriptyline which helped previously is no longer helpful taking 75 mg at night.  The patient is having a lot of dizziness with the headache.  Previously, she got no benefit from prednisone, she got no benefit, Depacon injection or Toradol injection.  The patient has been on Botox in the past, this did not help either.  She is on Seroquel currently and she has been on propranolol.  I will try a 3-day course of Thorazine and get her on Aimovig.

## 2019-08-11 NOTE — Telephone Encounter (Signed)
(  Key: [id number])  Your information has been submitted to [place]. [employer] [place] will review the request and notify you of the determination decision directly, typically within 3 business days of your submission and once all necessary information is received.  You will also receive your request decision electronically. To check for an update later, open the request again from your dashboard.  If [employer] has not responded within the specified timeframe or if you have any questions about your PA submission, contact [place] directly at [facility]) [phone] or ([place]) [phone].

## 2019-08-12 NOTE — Telephone Encounter (Signed)
PA has been approved; Effective from 08/11/2019 through 08/10/2020. [facility] has been [facility].

## 2019-08-12 NOTE — Telephone Encounter (Signed)
Approvedon November 11 Effective from 08/11/2019 through 08/10/2020.  PA has been approved [facility] notified.

## 2019-08-18 ENCOUNTER — Other Ambulatory Visit: Payer: Self-pay | Admitting: Neurology

## 2019-09-06 ENCOUNTER — Other Ambulatory Visit: Payer: Self-pay | Admitting: Otolaryngology

## 2019-10-06 ENCOUNTER — Telehealth: Payer: Self-pay | Admitting: Neurology

## 2019-10-06 NOTE — Telephone Encounter (Signed)
I called patient to reschedule appt that was cancelled with Dr. Jannifer Franklin. Patient states she has a sinus infection and would rather not come in the office at this time. Patient states she will call when she feels better.

## 2019-10-07 ENCOUNTER — Ambulatory Visit: Payer: Self-pay | Admitting: Neurology

## 2019-10-14 ENCOUNTER — Telehealth: Payer: Self-pay | Admitting: Neurology

## 2019-10-14 MED ORDER — CHLORPROMAZINE HCL 50 MG PO TABS: ORAL_TABLET | ORAL | 0 refills | Status: DC

## 2019-10-14 NOTE — Telephone Encounter (Signed)
Pt has called to report her headaches above her forehead are worsening and she would like Dr Jannifer Franklin to call in for her what was previously called in for her.

## 2019-10-14 NOTE — Telephone Encounter (Signed)
I called the patient.  The patient has had 2 to 3 weeks of daily headaches, better in the morning and worse as the day goes on.  In the past, a 3-day course of Thorazine has been helpful, I will send this prescription in.

## 2019-10-14 NOTE — Addendum Note (Signed)
Addended by: Kathrynn Ducking on: 10/14/2019 05:17 PM   Modules accepted: Orders

## 2019-11-01 ENCOUNTER — Ambulatory Visit: Payer: Self-pay

## 2019-11-01 ENCOUNTER — Encounter: Payer: Self-pay | Admitting: Family Medicine

## 2019-11-01 ENCOUNTER — Telehealth: Payer: Self-pay | Admitting: Neurology

## 2019-11-01 ENCOUNTER — Ambulatory Visit (INDEPENDENT_AMBULATORY_CARE_PROVIDER_SITE_OTHER): Payer: Medicare Other | Admitting: Family Medicine

## 2019-11-01 ENCOUNTER — Other Ambulatory Visit: Payer: Self-pay

## 2019-11-01 DIAGNOSIS — G8929 Other chronic pain: Secondary | ICD-10-CM

## 2019-11-01 DIAGNOSIS — M545 Low back pain: Secondary | ICD-10-CM | POA: Diagnosis not present

## 2019-11-01 DIAGNOSIS — M5136 Other intervertebral disc degeneration, lumbar region: Secondary | ICD-10-CM

## 2019-11-01 MED ORDER — DIAZEPAM 5 MG PO TABS: ORAL_TABLET | ORAL | 0 refills | Status: DC

## 2019-11-01 MED ORDER — BACLOFEN 10 MG PO TABS: 5.0000 mg | ORAL_TABLET | Freq: Three times a day (TID) | ORAL | 3 refills | Status: DC | PRN

## 2019-11-01 MED ORDER — CELECOXIB 200 MG PO CAPS: 200.0000 mg | ORAL_CAPSULE | Freq: Two times a day (BID) | ORAL | 6 refills | Status: DC | PRN

## 2019-11-01 NOTE — Addendum Note (Signed)
Addended by: Hortencia Pilar on: 11/01/2019 01:48 PM   Modules accepted: Orders

## 2019-11-01 NOTE — Patient Instructions (Signed)
    Vitamin D3:  Take 2 pills daily (4,000 IU total)

## 2019-11-01 NOTE — Progress Notes (Signed)
Office Visit Note   Patient: Kristin Gilmore           Date of Birth: 01-12-1951           MRN: [id number] Visit Date: 11/01/2019 Requested by: Aletha Halim., PA-C [id number]  [place] [address] PCP: Aletha Halim., PA-C  Subjective: Chief Complaint  Patient presents with  . Lower Back - Pain    Pain in upper buttock area, bilaterally. No radiating pain down the legs. Taking Gabapentin, tylenol and using ice.    HPI: She is here with lower back pain.  Symptoms started 4 or 5 months ago with no injury.  Over the past 2 months it has gotten steadily worse.  She has a constant pain across the tailbone area with no radiation down the legs.  It is very painful trying to transition from lying down to standing.  She cannot stand and walk very long before having to sit down again.  Denies any radicular symptoms, denies any weakness or numbness and denies any bowel or bladder dysfunction.  She has a history of back surgery per Dr. Tonita Cong a couple years ago and did well afterward.  It was a decompression from L2-L5.  She had a CT scan in October showing a large calcified disc protrusion at L4-5.  Severe degenerative disc disease at multiple levels.  Pelvic x-rays in October did not show any sign of fracture.              ROS:   All other systems were reviewed and are negative.  Objective: Vital Signs: There were no vitals taken for this visit.  Physical Exam:  General:  Alert and oriented, in no acute distress. Pulm:  Breathing unlabored. Psy:  Normal mood, congruent affect. Skin: No visible rash. Low back: She is very tender to palpation near the SI joints and across the L5-S1 S1 level.  Well-healed midline surgical scar present.  No significant pain in the sciatic notch.  Negative straight leg raise, lower extremity strength and reflexes are normal.  Imaging: X-rays lumbar spine: She has severe degenerative disc disease at L3-4, L4-5 and L5-S1.  No definite  compression fracture.  Hip joints are well-preserved.  SI joints are slightly arthritic and she has facet arthropathy at the lower levels.  No sign of neoplasm.  Assessment & Plan: 1.  Acute on chronic low back pain, possibly due to SI dysfunction, facet arthropathy or foraminal stenosis.  Neurologic exam is nonfocal. -We will proceed with physical therapy.  Also order lumbar MRI scan and referral for injections per Dr. Ernestina Patches.  Celebrex given for inflammation, baclofen for muscle spasm.     Procedures: No procedures performed  No notes on file     PMFS History: Patient Active Problem List   Diagnosis Date Noted  . Lumbar spinal stenosis 12/19/2017  . Pain in joint, ankle and foot 05/02/2016  . Chronic venous insufficiency 05/02/2016  . Peripheral edema 11/01/2015  . Intractable chronic migraine without aura 11/02/2014  . Tremor 04/13/2014  . Migraine without aura 04/13/2014  . 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 Arthritis   . Depression   . Dyslipidemia   . Hypertension   . Migraine   . Migraine without aura, without mention of intractable migraine without mention of status migrainosus 04/13/2014  . 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 Pre-diabetes    "i was told i was pre-diabetic a while ago"  .  Tremor 04/13/2014    Family History  Problem Relation Age of Onset  . Cancer Father        stomach cancer  . Migraines Mother     Past Surgical History:  Procedure Laterality Date  . CHOLECYSTECTOMY    . COLONOSCOPY W/ POLYPECTOMY    . LUMBAR LAMINECTOMY/DECOMPRESSION MICRODISCECTOMY N/A 12/19/2017   Procedure: LAMINECTOMY LUMBAR TWO- LUMBAR THREE, LUMBAR THREE- LUMBAR FOUR, LUMBAR FOUR- LUMBAR FIVE ;  Surgeon: Consuella Lose, MD;  Location: [facility];  Service: Neurosurgery;  Laterality: N/A;  . NASAL SINUS SURGERY    . TONSILLECTOMY    . WISDOM TOOTH EXTRACTION     Social History   Occupational History  .  Occupation: Retired  Tobacco Use  . Smoking status: Never Smoker  . Smokeless tobacco: Never Used  Substance and Sexual Activity  . Alcohol use: Yes    Alcohol/week: 0.0 standard drinks    Comment: occas.  . Drug use: No  . Sexual activity: Not on file

## 2019-11-01 NOTE — Telephone Encounter (Signed)
Patient called Friday evening , 10-30-2019 at 5 Pm and requested a medication for a headache that already had lasted 3 days. She reported that chlorpromazine had worked in the past and I refilled that medication for her.

## 2019-11-03 ENCOUNTER — Telehealth: Payer: Self-pay | Admitting: Neurology

## 2019-11-03 MED ORDER — CHLORPROMAZINE HCL 25 MG PO TABS: 25.0000 mg | ORAL_TABLET | Freq: Two times a day (BID) | ORAL | 0 refills | Status: DC

## 2019-11-03 NOTE — Telephone Encounter (Signed)
I called the patient.  The patient was very sleepy on the 50 mg dose of Thorazine which was what she had been given previously, she still has a headache, I will try the 25 mg tablet instead, this will be a 3-day course of the medication.

## 2019-11-03 NOTE — Telephone Encounter (Signed)
Duplicate message for pt. See phone note on 10/14/2019.Message has been sent to Dr. Jannifer Franklin for review already.

## 2019-11-03 NOTE — Telephone Encounter (Signed)
Pt called and LVM stating that she is needing to speak to RN or Provider about her medications and how they are making her sleep to much. Please advise.

## 2019-11-03 NOTE — Telephone Encounter (Signed)
Pt has called stating that the headache medication the Dr Brett Fairy put her on is too strong.  Pt stated she just woke up from 3pm yesterday afternoon.  Pt is asking that Dr Jannifer Franklin puts her on something not as strong.  Please call

## 2019-11-03 NOTE — Addendum Note (Signed)
Addended by: Kathrynn Ducking on: 11/03/2019 05:39 PM   Modules accepted: Orders

## 2019-11-03 NOTE — Telephone Encounter (Signed)
Pt called and stated that the nurse called her asking what the name of the medication was. She states it is chlorproMAZINE (THORAZINE) 50 MG tablet Please advise.

## 2019-11-04 ENCOUNTER — Ambulatory Visit: Payer: Medicare Other | Admitting: Physical Therapy

## 2019-11-04 ENCOUNTER — Telehealth: Payer: Self-pay

## 2019-11-04 MED ORDER — NABUMETONE 750 MG PO TABS: 750.0000 mg | ORAL_TABLET | Freq: Two times a day (BID) | ORAL | 6 refills | Status: DC | PRN

## 2019-11-04 MED ORDER — TIZANIDINE HCL 2 MG PO TABS: 2.0000 mg | ORAL_TABLET | Freq: Four times a day (QID) | ORAL | 1 refills | Status: DC | PRN

## 2019-11-04 NOTE — Telephone Encounter (Signed)
Rx's sent for nabumetone and zanaflex.

## 2019-11-04 NOTE — Telephone Encounter (Signed)
Please advise 

## 2019-11-04 NOTE — Telephone Encounter (Signed)
I called and advised the patient of the plan: take nabumetone in place of celebrex and tizandine (zanaflex) in place of baclofen. She voiced understanding. I did also advise her that she may need to 1/2 the nabumetone in order to swallow it - she said she does have problems swallowing big pills. She is to 1/2 it, but take 2 halves bid with food.

## 2019-11-04 NOTE — Addendum Note (Signed)
Addended by: Hortencia Pilar on: 11/04/2019 11:41 AM   Modules accepted: Orders

## 2019-11-04 NOTE — Telephone Encounter (Signed)
Patient called stating that Rx for Celebrex and Baclofen are not helping at all.  Stated that she is taking the Gabapentin and extra strength Tylenol along with the Rx's that she was prescribed.  Would like to know about another Rx?  CB# [phone].  Please advise.  Thank you.

## 2019-11-09 ENCOUNTER — Ambulatory Visit: Payer: Medicare Other | Admitting: Physical Therapy

## 2019-11-16 ENCOUNTER — Telehealth: Payer: Self-pay

## 2019-11-16 ENCOUNTER — Other Ambulatory Visit: Payer: Self-pay

## 2019-11-16 ENCOUNTER — Encounter: Payer: Self-pay | Admitting: Physical Medicine and Rehabilitation

## 2019-11-16 ENCOUNTER — Ambulatory Visit: Payer: Self-pay

## 2019-11-16 ENCOUNTER — Ambulatory Visit (INDEPENDENT_AMBULATORY_CARE_PROVIDER_SITE_OTHER): Payer: Medicare Other | Admitting: Physical Medicine and Rehabilitation

## 2019-11-16 ENCOUNTER — Telehealth: Payer: Self-pay | Admitting: Physical Therapy

## 2019-11-16 VITALS — BP 144/78 | HR 80

## 2019-11-16 DIAGNOSIS — M47816 Spondylosis without myelopathy or radiculopathy, lumbar region: Secondary | ICD-10-CM | POA: Diagnosis not present

## 2019-11-16 DIAGNOSIS — G8929 Other chronic pain: Secondary | ICD-10-CM

## 2019-11-16 DIAGNOSIS — M48062 Spinal stenosis, lumbar region with neurogenic claudication: Secondary | ICD-10-CM

## 2019-11-16 DIAGNOSIS — M961 Postlaminectomy syndrome, not elsewhere classified: Secondary | ICD-10-CM

## 2019-11-16 MED ORDER — METHYLPREDNISOLONE ACETATE 80 MG/ML IJ SUSP
40.0000 mg | Freq: Once | INTRAMUSCULAR | Status: AC
  Administered 2019-11-16: 40 mg

## 2019-11-16 NOTE — Telephone Encounter (Signed)
Done

## 2019-11-16 NOTE — Addendum Note (Signed)
Addended by: Hortencia Pilar on: 11/16/2019 02:31 PM   Modules accepted: Orders

## 2019-11-16 NOTE — Progress Notes (Signed)
Numeric Pain Rating Scale and Functional Assessment Average Pain (10) Pain Right Now (7)  Pain is worse with: standing and walking  Pain improves with: tylenol and gabapentin take edge off  In the last MONTH (on 0-10 scale) has pain interfered with the following?  1. General activity like being  able to carry out your everyday physical activities such as walking, climbing stairs, carrying groceries, or moving a chair?  Rating(10)  2. Relation with others like being able to carry out your usual social activities and roles such as  activities at home, at work and in your community. Rating(10)  3. Enjoyment of life such that you have  been bothered by emotional problems such as feeling anxious, depressed or irritable?  Rating(10)

## 2019-11-16 NOTE — Telephone Encounter (Signed)
Phoned pt at the request of our front office support rep. The patient canceled 2 appointments for various reasons and the front office was calling to get her rescheduled earlier today. Unfortunately because we have a PT going out on maternity leave, we needed to reschedule her evaluation. The first available time we had was 11/29/2019 but we also offered to put her on our cancellation list. She became very upset and was rude and yelling at our secretary. She then told her to "figure it out" and hung up on her. This pt has a history of this behavior at our clinic so I called to let her know it would be best for her to find another PT clinic to go to. She again became upset but I let her know we do not tolerate this behavior towards our staff. She stated that she was working to get an appointment at Dr. [facility] Liter office where they also offer PT. She verbalized understanding that we would not be able to see her at this clinic. [name], [place] 11/16/19 4:48 PM

## 2019-11-16 NOTE — Telephone Encounter (Signed)
Pt comes in today to see dr. Ernestina Patches and requested a referral to PT at our office. She stated she is unable to get into PT at [facility]. She stated that they keep cancelling on her. Please advise

## 2019-11-16 NOTE — Telephone Encounter (Signed)
OK with you?

## 2019-11-17 ENCOUNTER — Encounter: Payer: Self-pay | Admitting: Physical Medicine and Rehabilitation

## 2019-11-17 NOTE — Progress Notes (Signed)
Kristin Gilmore - 69 y.o. female MRN [id number]  Date of birth: 1950/11/06  Office Visit Note: Visit Date: 11/16/2019 PCP: Aletha Halim., PA-C Referred by: Aletha Halim., PA-C  Subjective: Chief Complaint  Patient presents with  . Lower Back - Pain   HPI: Kristin Gilmore is a 69 y.o. female who comes in today New patient consultation and potential interventional spine procedure as requested by Dr. Eunice Blase.  Patient represents a complicated pain patient whose really had multiple pain complaints including back pain for many years.  Reviewing a lot of notes today through our system including Dr. Junius Roads shows that she has been seen in the past by pain management physicians including Dr. Brien Few as well as has had a laminectomy at multiple levels by Dr. Kathyrn Sheriff.  The lumbar surgery was December 19, 2017.  She has been treated at [facility] and they have directed some injections for her spine as well.  The patient complains of axial lower back pain without any referral down the legs.  She reports she cannot stand for any length of time without having to sit down.  Standing and walking is problematic.  The more she walks she has to find a place to sit and recover.  No referral pain to the buttocks or legs.  No paresthesias no focal weakness.  She does report some improvement with the surgery to a degree but continues to have issues.  She does have MRI of the lumbar spine currently on order.  She did have CT scan over the last year that did show continued disc osteophyte at L4-5 with some level of central narrowing despite decompression.  She uses Tylenol and gabapentin that seem to help a little bit.  She is not on any current opioids.  She rates her pain as a 10 out of 10 and is really miserable because she just cannot do what she wants to do.  Her pain complaints also include migraine with aura which has been a long-term.  She is followed by Dr. Floyde Parkins at [facility] neurology.   His notes reviewed as well.  She also has a history of multiple joint issues and what appears to be essential tremor.  She has had no recent falls or trauma.  It appears that injections that we know about that I can see in the chart were epidural injections prior to the surgery.  Other pain type medications include turmeric, Elavil and tizanidine.  Review of Systems  Constitutional: Negative for chills, fever, malaise/fatigue and weight loss.  HENT: Negative for hearing loss and sinus pain.   Eyes: Negative for blurred vision, double vision and photophobia.  Respiratory: Negative for cough and shortness of breath.   Cardiovascular: Negative for chest pain, palpitations and leg swelling.  Gastrointestinal: Negative for abdominal pain, nausea and vomiting.  Genitourinary: Negative for flank pain.  Musculoskeletal: Positive for back pain and joint pain. Negative for myalgias.  Skin: Negative for itching and rash.  Neurological: Positive for tremors. Negative for focal weakness and weakness.  Endo/Heme/Allergies: Negative.   Psychiatric/Behavioral: Negative for depression.  All other systems reviewed and are negative.  Otherwise per HPI.  Assessment & Plan: Visit Diagnoses:  1. Spondylosis without myelopathy or radiculopathy, lumbar region   2. Spinal stenosis of lumbar region with neurogenic claudication   3. Chronic bilateral low back pain without sciatica   4. Post laminectomy syndrome     Plan: Findings:  Complicated chronic many year history of low back pain  status post three-level laminectomy decompression by Dr. Kathyrn Sheriff in 2019 for severe stenosis.  Current pain seems to be more facet joint mediated pain in the lower back worse on exam with facet loading and extension.  She has trouble going from sit to stand.  She still may have some level of stenosis at L4-5 based on CT scan.  MRI is on order currently and we are awaiting this.  This seems to be scheduled for March 3.  She has had  physical therapy on order as well and she states that they have counsel her on a few occasions.  Looking at the notes however it feels like to me on the notes that she had canceled those appointments.  Regardless we did send a message to Dr. Junius Roads to get the physical therapy changed to in-house.  I do think she would benefit from L4-5 facet joint block or L5-S1 facet joint block.  I think her pain really is across that area.  Do the amount of pain she is having she does want to do that today.  I do think it would give [place] some information if it helped her or did not help or at least it would go down that track of figuring this out.  If she did get relief with it we could look at diagnostic medial branch block and radiofrequency ablation.  Depending on MRI when it is completed could look at epidural injection.  Patient may be a candidate for spinal cord stimulator trial.  She will continue with current medications.    Meds & Orders:  Meds ordered this encounter  Medications  . methylPREDNISolone acetate (DEPO-MEDROL) injection 40 mg    Orders Placed This Encounter  Procedures  . Facet Injection  . XR C-ARM NO REPORT    Follow-up: Return if symptoms worsen or fail to improve, for MRI review after completion.   Procedures: No procedures performed  Lumbar Facet Joint Intra-Articular Injection(s) with Fluoroscopic Guidance  Patient: Kristin Gilmore      Date of Birth: [birth year] MRN: [id number] PCP: Aletha Halim., PA-C      Visit Date: 11/16/2019   Universal Protocol:    Date/Time: 11/16/2019  Consent Given By: the patient  Position: PRONE   Additional Comments: Vital signs were monitored before and after the procedure. Patient was prepped and draped in the usual sterile fashion. The correct patient, procedure, and site was verified.   Injection Procedure Details:  Procedure Site One Meds Administered:  Meds ordered this encounter  Medications  . methylPREDNISolone acetate  (DEPO-MEDROL) injection 40 mg     Laterality: Bilateral  Location/Site:  L5-S1  Needle size: 22 guage  Needle type: Spinal  Needle Placement: Articular  Findings:  -Comments: Excellent flow of contrast producing a partial arthrogram.  Procedure Details: The fluoroscope beam is vertically oriented in AP, and the inferior recess is visualized beneath the lower pole of the inferior apophyseal process, which represents the target point for needle insertion. When direct visualization is difficult the target point is located at the medial projection of the vertebral pedicle. The region overlying each aforementioned target is locally anesthetized with a 1 to 2 ml. volume of 1% Lidocaine without Epinephrine.   The spinal needle was inserted into each of the above mentioned facet joints using biplanar fluoroscopic guidance. A 0.25 to 0.5 ml. volume of Isovue-250 was injected and a partial facet joint arthrogram was obtained. A single spot film was obtained of the resulting arthrogram.  One to 1.25 ml of the steroid/anesthetic solution was then injected into each of the facet joints noted above.   Additional Comments:  The patient tolerated the procedure well Dressing: 2 x 2 sterile gauze and Band-Aid    Post-procedure details: Patient was observed during the procedure. Post-procedure instructions were reviewed.  Patient left the clinic in stable condition.     Clinical History: No specialty comments available.   She reports that she has never smoked. She has never used smokeless tobacco. No results for input(s): HGBA1C, LABURIC in the last 8760 hours.  Objective:  VS:  HT:    WT:   BMI:     BP:(!) 144/78  HR:80bpm  TEMP: ( )  RESP:  Physical Exam Vitals and nursing note reviewed.  Constitutional:      General: She is not in acute distress.    Appearance: Normal appearance. She is well-developed. She is obese.  HENT:     Head: Normocephalic and atraumatic.     Nose:  Nose normal.     Mouth/Throat:     Mouth: Mucous membranes are moist.     Pharynx: Oropharynx is clear.  Eyes:     Conjunctiva/sclera: Conjunctivae normal.     Pupils: Pupils are equal, round, and reactive to light.  Cardiovascular:     Rate and Rhythm: Regular rhythm.  Pulmonary:     Effort: Pulmonary effort is normal. No respiratory distress.  Abdominal:     General: There is no distension.     Palpations: Abdomen is soft.     Tenderness: There is no guarding.  Musculoskeletal:     Cervical back: Normal range of motion and neck supple.     Right lower leg: No edema.     Left lower leg: No edema.     Comments: Patient ambulates with walker.  She ambulates with significant forward flexion of the lumbar spine.  She has pain with extension and facet loading.  No pain with hip rotation she has good distal strength without any weakness.  Skin:    General: Skin is warm and dry.     Findings: No erythema or rash.  Neurological:     General: No focal deficit present.     Mental Status: She is alert and oriented to person, place, and time.     Motor: No abnormal muscle tone.     Coordination: Coordination normal.     Gait: Gait normal.  Psychiatric:        Mood and Affect: Mood normal.        Behavior: Behavior normal.        Thought Content: Thought content normal.     Ortho Exam Imaging: XR C-ARM NO REPORT  Result Date: 11/16/2019 Please see Notes tab for imaging impression.   Past Medical/Family/Surgical/Social History: Medications & Allergies reviewed per EMR, new medications updated. Patient Active Problem List   Diagnosis Date Noted  . Lumbar spinal stenosis 12/19/2017  . Pain in joint, ankle and foot 05/02/2016  . Chronic venous insufficiency 05/02/2016  . Peripheral edema 11/01/2015  . Intractable chronic migraine without aura 11/02/2014  . Tremor 04/13/2014  . Migraine without aura 04/13/2014  . Pituitary microadenoma ([place]) 04/13/2014   Past Medical History:    Diagnosis Date  . Arthritis   . Depression   . Dyslipidemia   . Hypertension   . Migraine   . Migraine without aura, without mention of intractable migraine without mention of status migrainosus 04/13/2014  . Pituitary microadenoma (  [place]) 04/13/2014  . Pre-diabetes    "i was told i was pre-diabetic a while ago"  . Tremor 04/13/2014   Family History  Problem Relation Age of Onset  . Cancer Father        stomach cancer  . Migraines Mother    Past Surgical History:  Procedure Laterality Date  . CHOLECYSTECTOMY    . COLONOSCOPY W/ POLYPECTOMY    . LUMBAR LAMINECTOMY/DECOMPRESSION MICRODISCECTOMY N/A 12/19/2017   Procedure: LAMINECTOMY LUMBAR TWO- LUMBAR THREE, LUMBAR THREE- LUMBAR FOUR, LUMBAR FOUR- LUMBAR FIVE ;  Surgeon: Consuella Lose, MD;  Location: [facility];  Service: Neurosurgery;  Laterality: N/A;  . NASAL SINUS SURGERY    . TONSILLECTOMY    . WISDOM TOOTH EXTRACTION     Social History   Occupational History  . Occupation: Retired  Tobacco Use  . Smoking status: Never Smoker  . Smokeless tobacco: Never Used  Substance and Sexual Activity  . Alcohol use: Yes    Alcohol/week: 0.0 standard drinks    Comment: occas.  . Drug use: No  . Sexual activity: Not on file

## 2019-11-17 NOTE — Procedures (Signed)
Lumbar Facet Joint Intra-Articular Injection(s) with Fluoroscopic Guidance  Patient: Kristin Gilmore      Date of Birth: 11-17-1950 MRN: [id number] PCP: Aletha Halim., PA-C      Visit Date: 11/16/2019   Universal Protocol:    Date/Time: 11/16/2019  Consent Given By: the patient  Position: PRONE   Additional Comments: Vital signs were monitored before and after the procedure. Patient was prepped and draped in the usual sterile fashion. The correct patient, procedure, and site was verified.   Injection Procedure Details:  Procedure Site One Meds Administered:  Meds ordered this encounter  Medications  . methylPREDNISolone acetate (DEPO-MEDROL) injection 40 mg     Laterality: Bilateral  Location/Site:  L5-S1  Needle size: 22 guage  Needle type: Spinal  Needle Placement: Articular  Findings:  -Comments: Excellent flow of contrast producing a partial arthrogram.  Procedure Details: The fluoroscope beam is vertically oriented in AP, and the inferior recess is visualized beneath the lower pole of the inferior apophyseal process, which represents the target point for needle insertion. When direct visualization is difficult the target point is located at the medial projection of the vertebral pedicle. The region overlying each aforementioned target is locally anesthetized with a 1 to 2 ml. volume of 1% Lidocaine without Epinephrine.   The spinal needle was inserted into each of the above mentioned facet joints using biplanar fluoroscopic guidance. A 0.25 to 0.5 ml. volume of Isovue-250 was injected and a partial facet joint arthrogram was obtained. A single spot film was obtained of the resulting arthrogram.    One to 1.25 ml of the steroid/anesthetic solution was then injected into each of the facet joints noted above.   Additional Comments:  The patient tolerated the procedure well Dressing: 2 x 2 sterile gauze and Band-Aid    Post-procedure details: Patient was  observed during the procedure. Post-procedure instructions were reviewed.  Patient left the clinic in stable condition.

## 2019-11-18 ENCOUNTER — Ambulatory Visit: Payer: Medicare Other | Admitting: Physical Therapy

## 2019-11-23 ENCOUNTER — Telehealth: Payer: Self-pay | Admitting: Physical Medicine and Rehabilitation

## 2019-11-23 NOTE — Telephone Encounter (Signed)
MRI scheduled for 3/3.

## 2019-11-26 ENCOUNTER — Telehealth: Payer: Self-pay | Admitting: *Deleted

## 2019-11-29 ENCOUNTER — Telehealth: Payer: Self-pay

## 2019-11-29 NOTE — Telephone Encounter (Signed)
Message left on VM,  I called patient and discussed. Forwarded message to [name].

## 2019-11-29 NOTE — Telephone Encounter (Signed)
Patient called back, [name] concerned about why she had not heard back from our office. I explained to her per Dr Newtons last response. She did say that her last injection did not help at all, at times can hardly walk. I advised patient I would let you all know.  She said that she will just proceed with scan at this time but she did want to know who should she follow up with after MRI scan?

## 2019-11-29 NOTE — Telephone Encounter (Signed)
The plan was to see what the MRI on March 3 revealed.  Knowing that the injection did not help at the skin is a some useful information.  You could ask her if it helped even temporarily and try to get an idea of how much it helped and not how long.  If that symptomatic relief was less than 50% we definitely want to see what happens on the MRI.

## 2019-11-29 NOTE — Telephone Encounter (Signed)
Called pt and advised per Dr. Ernestina Patches and she understood and will call back once MRI has been scheduled.

## 2019-11-30 ENCOUNTER — Ambulatory Visit (INDEPENDENT_AMBULATORY_CARE_PROVIDER_SITE_OTHER): Payer: Medicare Other | Admitting: Rehabilitative and Restorative Service Providers"

## 2019-11-30 ENCOUNTER — Encounter: Payer: Self-pay | Admitting: Rehabilitative and Restorative Service Providers"

## 2019-11-30 ENCOUNTER — Other Ambulatory Visit: Payer: Self-pay

## 2019-11-30 DIAGNOSIS — M6281 Muscle weakness (generalized): Secondary | ICD-10-CM | POA: Diagnosis not present

## 2019-11-30 DIAGNOSIS — R262 Difficulty in walking, not elsewhere classified: Secondary | ICD-10-CM

## 2019-11-30 DIAGNOSIS — M545 Low back pain, unspecified: Secondary | ICD-10-CM

## 2019-11-30 DIAGNOSIS — G8929 Other chronic pain: Secondary | ICD-10-CM | POA: Diagnosis not present

## 2019-11-30 NOTE — Patient Instructions (Signed)
Access Code: [email or web address] URL: [email or web address] Date: 11/30/2019 Prepared by: Scot Jun  Exercises Supine Lower Trunk Rotation - 5 reps - 1 sets - 15 hold - 2x daily - 7x weekly Standing Lumbar Extension - 10 reps - 2-3 sets - 2x daily - 7x weekly Hooklying Gluteal Sets - 10 reps - 1 sets - 5 hold - 2x daily - 7x weekly

## 2019-11-30 NOTE — Therapy (Signed)
[place], [place], [address] Phone: [phone]   Fax:  [phone]  Physical Therapy Evaluation  Patient Details  Name: Kristin Gilmore MRN: [id number] Date of Birth: December 22, 1950 Referring Provider (PT): Dr. Junius Roads   Encounter Date: 11/30/2019  PT End of Session - 11/30/19 1541    Visit Number  1    Number of Visits  12    Date for PT Re-Evaluation  01/11/20    Authorization Type  Medicare    PT Start Time  1445    PT Stop Time  1525    PT Time Calculation (min)  40 min    Activity Tolerance  Patient tolerated treatment well    Behavior During Therapy  [facility] for tasks assessed/performed       Past Medical History:  Diagnosis Date  . Arthritis   . Depression   . Dyslipidemia   . Hypertension   . Migraine   . Migraine without aura, without mention of intractable migraine without mention of status migrainosus 04/13/2014  . Pituitary microadenoma ([place]) 04/13/2014  . Pre-diabetes    "i was told i was pre-diabetic a while ago"  . Tremor 04/13/2014    Past Surgical History:  Procedure Laterality Date  . CHOLECYSTECTOMY    . COLONOSCOPY W/ POLYPECTOMY    . LUMBAR LAMINECTOMY/DECOMPRESSION MICRODISCECTOMY N/A 12/19/2017   Procedure: LAMINECTOMY LUMBAR TWO- LUMBAR THREE, LUMBAR THREE- LUMBAR FOUR, LUMBAR FOUR- LUMBAR FIVE ;  Surgeon: Consuella Lose, MD;  Location: [place];  Service: Neurosurgery;  Laterality: N/A;  . NASAL SINUS SURGERY    . TONSILLECTOMY    . WISDOM TOOTH EXTRACTION      There were no vitals filed for this visit.   Subjective Assessment - 11/30/19 1456    Subjective  Pt. indicated having history of chronic low back pain.  Pt. indicated feeling last 6 months has worsened symptoms, c difficulty standing, walking.  Noticed more after inactivity such as prolonged sitting, lying.  Pt. indicated previous level of function included water exercise but has been limited due to COVID protocol, reducing her  physical activity.  Pt. indicated symptoms mostly in back but sometimes into L leg anterior at times.  Pt. stated some medicine has helped symptoms as well getting better during the day.    Limitations  Standing;Walking    Diagnostic tests  xray.  MRI to be performed tomorrow.    Patient Stated Goals  Reduce pain    Currently in Pain?  Yes    Pain Score  1    at worst 10/10.  at best 0/10   Pain Location  Back    Pain Orientation  Lower;Left;Right    Pain Type  Chronic pain    Pain Onset  More than a month ago    Pain Frequency  Intermittent    Aggravating Factors   Getting up after sleeping, prolonged standing, prolonged walking    Pain Relieving Factors  Some medicine    Effect of Pain on Daily Activities  Limited in daily household activity, self care         [facility] PT Assessment - 11/30/19 0001      Assessment   Medical Diagnosis  Spondylosis, Lumbar    Referring Provider (PT)  Dr. Junius Roads    Onset Date/Surgical Date  07/01/19    Hand Dominance  Right    Prior Therapy  yes      Precautions   Precautions  None  Restrictions   Weight Bearing Restrictions  No      Balance Screen   Has the patient fallen in the past 6 months  Yes    How many times?  3    Has the patient had a decrease in activity level because of a fear of falling?   Yes   due to pain symptoms   Is the patient reluctant to leave their home because of a fear of falling?   No      Home Environment   Living Environment  Private residence    Living Arrangements  Spouse/significant other    Type of [place] to enter    Entrance Stairs-Number of Steps  5    [place]  Two level    Additional Comments  Lift      Prior Function   Level of Independence  Independent      Cognition   Overall Cognitive Status  Within Functional Limits for tasks assessed      Sensation   Light Touch  Appears Intact      Posture/Postural Control   Posture Comments  Standing posture revealed  forward turnk lean, reduced lumbar lordosis, mild increase in thoracic kyphosis      ROM / Strength   AROM / PROM / Strength  Strength;PROM;AROM      AROM   AROM Assessment Site  Lumbar    Lumbar Flexion  to patella c no complaints    Lumbar Extension  25% c ERP lumbar, reduced upon return.  x 5 REIS increased movement to 50%      Strength   Strength Assessment Site  Ankle;Knee;Hip    Right/Left Hip  Left;Right    Right Hip Flexion  5/5    Left Hip Flexion  5/5    Right/Left Knee  Left;Right    Right Knee Flexion  4/5    Right Knee Extension  4/5    Left Knee Flexion  4/5    Left Knee Extension  4/5    Right/Left Ankle  Left;Right    Right Ankle Dorsiflexion  5/5    Left Ankle Dorsiflexion  4+/5      Palpation   Spinal mobility  Concordant pain c hypomobility L3-L5.   Hypomobility L1-L2    Palpation comment  Tenderness in lumbar paraspinals, superior glute max bilaterally      Special Tests    Special Tests  Lumbar    Lumbar Tests  Slump Test;Straight Leg Raise      Slump test   Findings  Negative    Side  Left   Bilaterally     Bed Mobility   Bed Mobility  Supine to Sit    Supine to Sit  Supervision/Verbal cueing      Ambulation/Gait   Assistive device  Straight cane    Gait Pattern  Decreased step length - left;Decreased step length - right                Objective measurements completed on examination: See above findings.      [facility] Adult PT Treatment/Exercise - 11/30/19 0001      Exercises   Exercises  Lumbar      Lumbar Exercises: Stretches   Lower Trunk Rotation  5 reps   15 seconds x 5 bilaterally   Standing Extension  10 reps      Lumbar Exercises: Supine   Glut Set  5 seconds;10 reps  Manual Therapy   Manual Therapy  Joint mobilization    Joint Mobilization  prone L2-L4 G2-g3 cPA             PT Education - 11/30/19 1522    Education Details  HEP, POC    Person(s) Educated  Patient    Methods   Explanation;Demonstration;Handout;Verbal cues    Comprehension  Verbalized understanding;Returned demonstration          PT Long Term Goals - 11/30/19 1538      PT LONG TERM GOAL #1   Title  Patient will demonstrate/report pain at worst less than or equal to 2/10 to facilitate minimal limitation in daily activity secondary to pain symptoms.    Time  6    Period  Weeks    Status  New    Target Date  01/11/20      PT LONG TERM GOAL #2   Title  Patient will demonstrate independent use of home exercise program to facilitate ability to maintain/progress functional gains from skilled physical therapy services.    Time  6    Period  Weeks    Status  New    Target Date  01/11/20      PT LONG TERM GOAL #3   Title  Patient will demonstrate LRAD ambulation community distances > 300 ft to facilitate community integration at [facility].    Time  6    Period  Weeks    Status  New    Target Date  01/11/20      PT LONG TERM GOAL #4   Title  Patient will demonstrate lumbar extension 75 or greater % WFL s symptoms to facilitate upright standing, walking posture at PLOF s limitation.    Time  6    Period  Weeks    Status  New    Target Date  01/11/20      PT LONG TERM GOAL #5   Title  Pt. will demonstrate ability to return to usual water aerobics s restriction to/from [facility].    Time  6    Period  Weeks    Status  New    Target Date  01/11/20      Additional Long Term Goals   Additional Long Term Goals  Yes      PT LONG TERM GOAL #6   Title  Pt. will demonstrate bilateral knee MMT 5/5 throughout to facilitate transfers, standing, walking at PLOF.    Time  6    Period  Weeks    Status  New    Target Date  01/11/20             Plan - 11/30/19 1534    Clinical Impression Statement  Patient is a 68 y.o. female who comes to clinic with complaints of low back pain with mobility, strength deficits that impair her ability to perform usual daily and recreational functional activities  without increase difficulty/symptoms at this time.  Patient to benefit from skilled PT services to address impairments and limitations to improve to previous level of function without restriction secondary to condition.    Personal Factors and Comorbidities  Age;Past/Current Experience;Comorbidity 1;Other   Length of time of chronic pain complaints.   Comorbidities  Previous lumbar surgery    Examination-Activity Limitations  Bed Mobility;Bend;Stand;Squat;Transfers;Other    Examination-Participation Restrictions  Cleaning;Meal Prep;Community Activity    Stability/Clinical Decision Making  Stable/Uncomplicated    Clinical Decision Making  Low    Rehab Potential  Fair  PT Frequency  2x / week    PT Duration  6 weeks    PT Treatment/Interventions  ADLs/Self Care Home Management;Cryotherapy;Electrical Stimulation;Ultrasound;Traction;Moist Heat;Gait training;Stair training;Functional mobility training;Therapeutic activities;Therapeutic exercise;Balance training;Neuromuscular re-education;Manual techniques;Patient/family education;Passive range of motion;Dry needling;Joint Manipulations;Spinal Manipulations;Taping    PT Next Visit Plan  HEP review, lumbar mobility intervention    PT Home Exercise Plan  [id number]    Consulted and Agree with Plan of Care  Patient       Patient will benefit from skilled therapeutic intervention in order to improve the following deficits and impairments:  Decreased mobility, Decreased strength, Postural dysfunction, Hypomobility, Decreased balance, Pain, Decreased activity tolerance, Increased muscle spasms, Difficulty walking, Decreased range of motion, Abnormal gait  Visit Diagnosis: Chronic low back pain, unspecified back pain laterality, unspecified whether sciatica present - Plan: PT plan of care cert/re-cert  Muscle weakness (generalized) - Plan: PT plan of care cert/re-cert  Difficulty in walking, not elsewhere classified - Plan: PT plan of care  cert/re-cert     Problem List Patient Active Problem List   Diagnosis Date Noted  . Lumbar spinal stenosis 12/19/2017  . Pain in joint, ankle and foot 05/02/2016  . Chronic venous insufficiency 05/02/2016  . Peripheral edema 11/01/2015  . Intractable chronic migraine without aura 11/02/2014  . Tremor 04/13/2014  . Migraine without aura 04/13/2014  . Pituitary microadenoma ([place]) 04/13/2014   Scot Jun, PT, DPT, OCS, ATC 11/30/19  3:44 PM    [facility] [address] [place], [place], [address] Phone: [phone]   Fax:  [phone]  Name: ZYLEAH STRAUGHTER MRN: [id number] Date of Birth: Mar 25, 1951

## 2019-12-01 ENCOUNTER — Ambulatory Visit
Admission: RE | Admit: 2019-12-01 | Discharge: 2019-12-01 | Disposition: A | Discharge status: Home / Self Care | Payer: Medicare Other | Source: Ambulatory Visit | Attending: Family Medicine | Admitting: Family Medicine

## 2019-12-01 DIAGNOSIS — M5136 Other intervertebral disc degeneration, lumbar region: Secondary | ICD-10-CM

## 2019-12-01 DIAGNOSIS — G8929 Other chronic pain: Secondary | ICD-10-CM

## 2019-12-01 IMAGING — MR MR LUMBAR SPINE W/O CM
4 of 5 series · 24 of 48 positions shown · non-contrast
Comparison: Myelogram 01/23/2015

CLINICAL DATA: Low back pain worsening over the last 6 months.

EXAM:
MRI LUMBAR SPINE WITHOUT CONTRAST
TECHNIQUE: Multiplanar, multisequence MR imaging of the lumbar spine was
performed. No intravenous contrast was administered.

[Series 2: T2 · sagittal · 4.0mm · 0.55mm/px · 6 of 16 slices shown (1 of 2)]
[im 1/16]
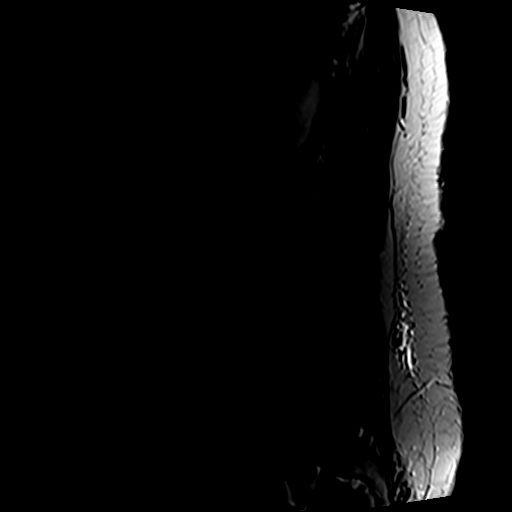
[im 4/16]
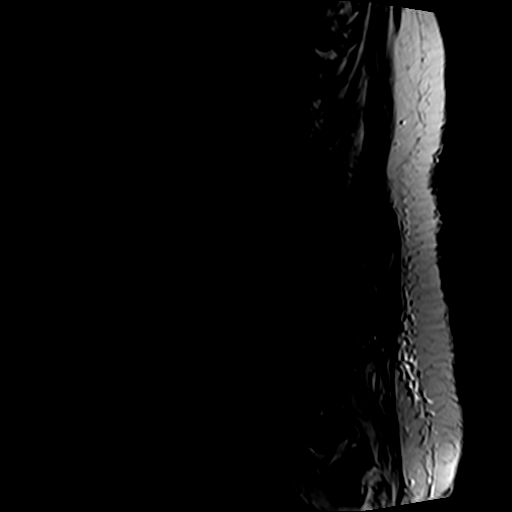
[im 7/16]
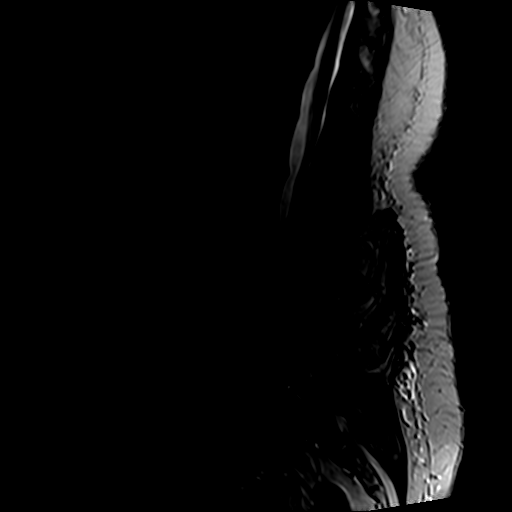
[im 10/16]
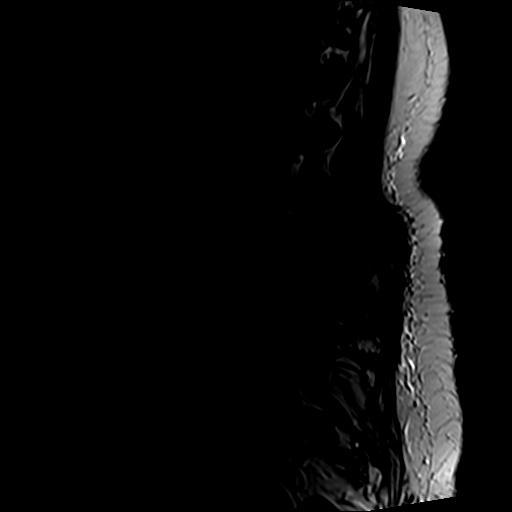
[im 13/16]
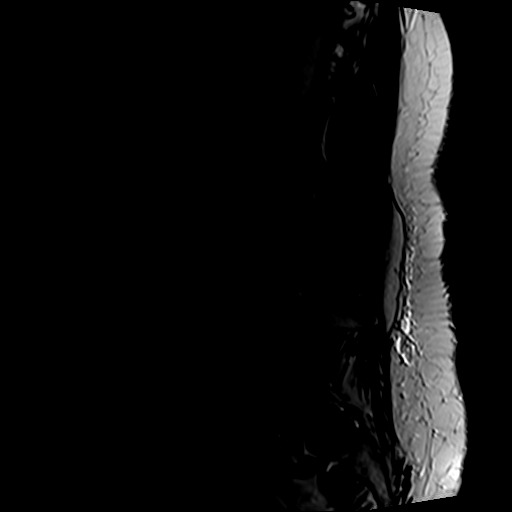
[im 16/16]
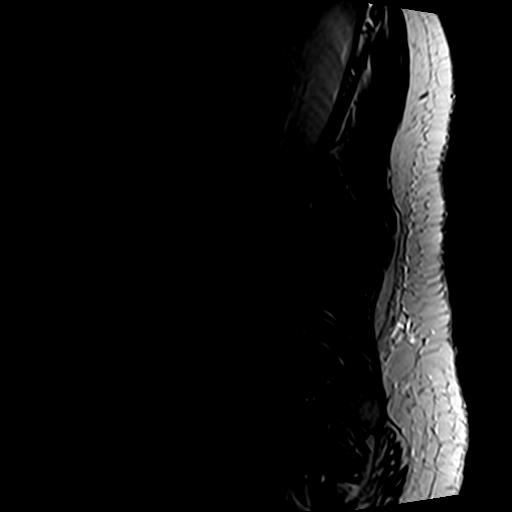

[Series 4: T1 · sagittal · 4.0mm · 0.55mm/px · 7 of 16 slices shown (1 of 2)]
[im 1/16]
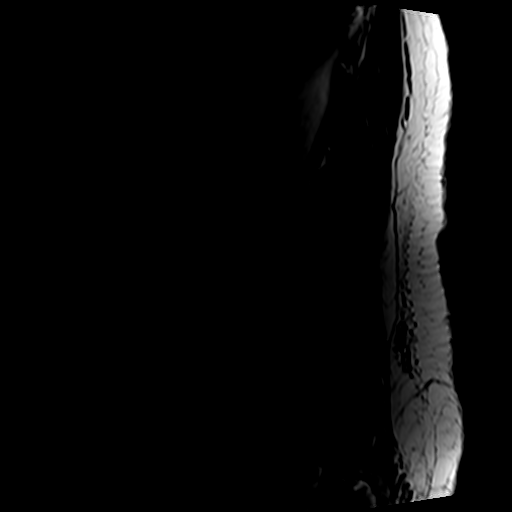
[im 3/16]
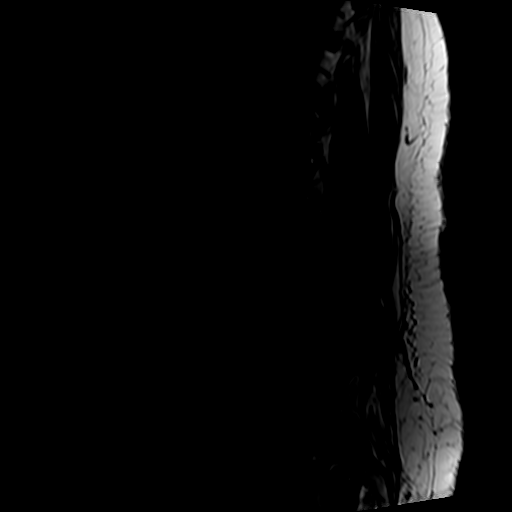
[im 6/16]
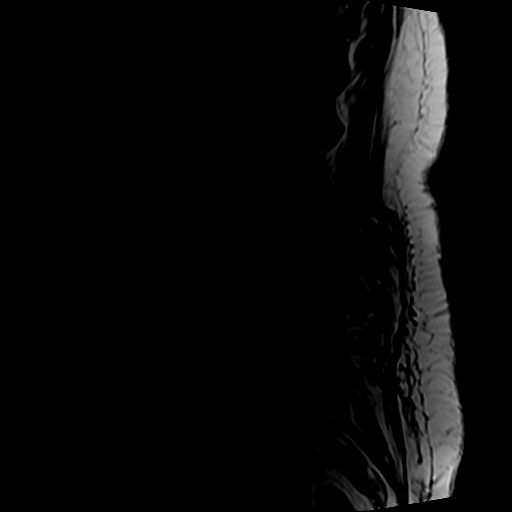
[im 8/16]
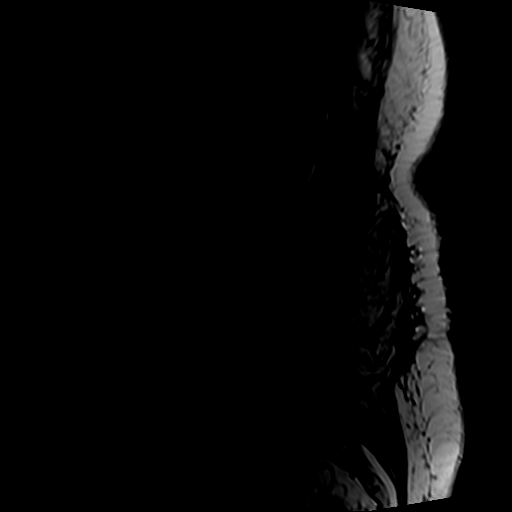
[im 11/16]
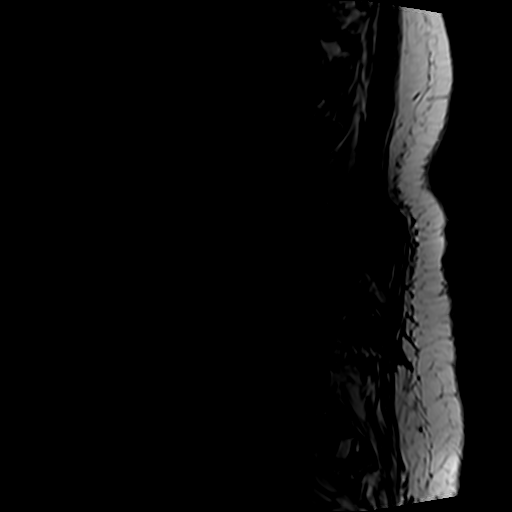
[im 13/16]
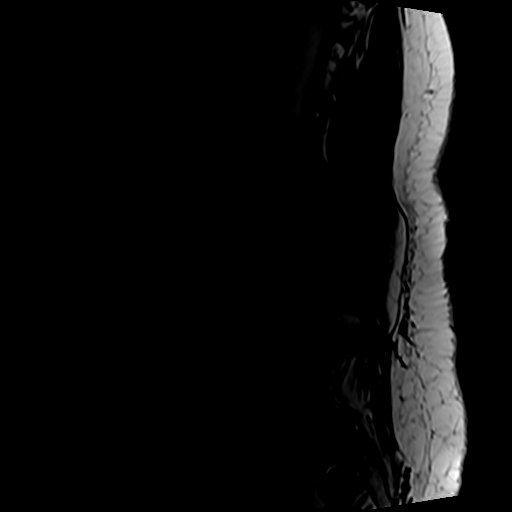
[im 16/16]
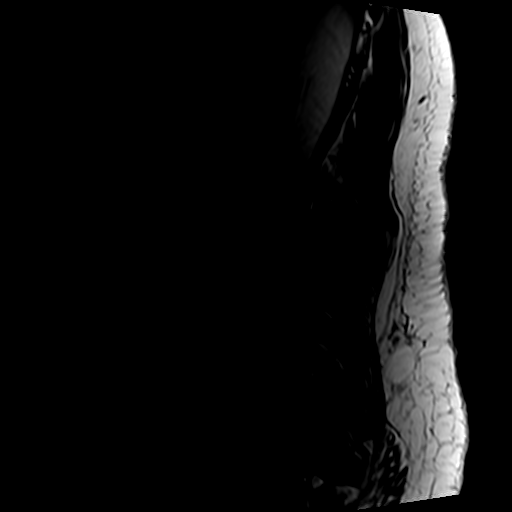

[Series 5: T2 · axial · 4.0mm · 0.70mm/px · z∈[-127,+68]mm · 8 of 34 slices shown (2 of 2)]
[im 1/34]
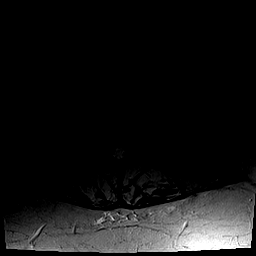
[im 6/34]
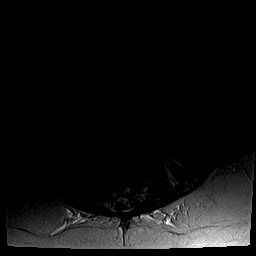
[im 11/34]
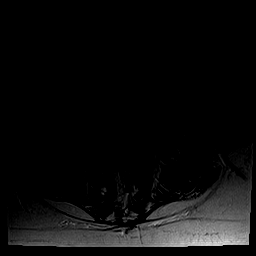
[im 16/34]
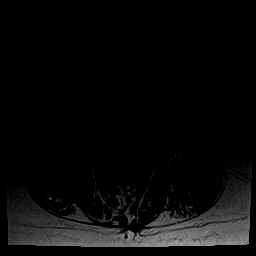
[im 18/34]
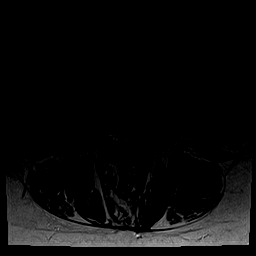
[im 23/34]
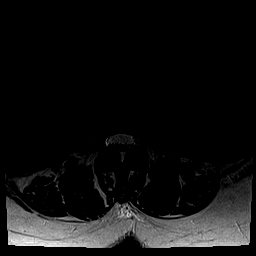
[im 28/34]
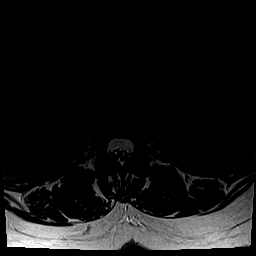
[im 34/34]
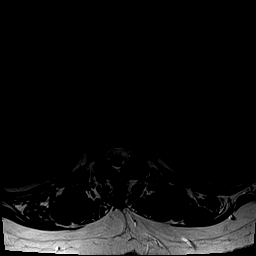

[Series 6: T1 · axial · 4.0mm · 0.35mm/px · z∈[-101,+37]mm · 3 of 34 slices shown (2 of 2)]
[im 6/34]
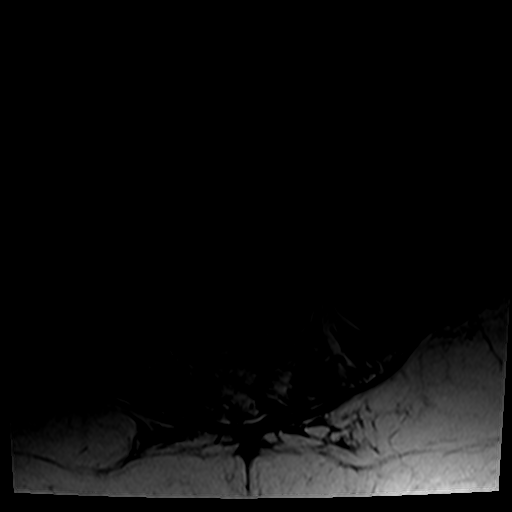
[im 18/34]
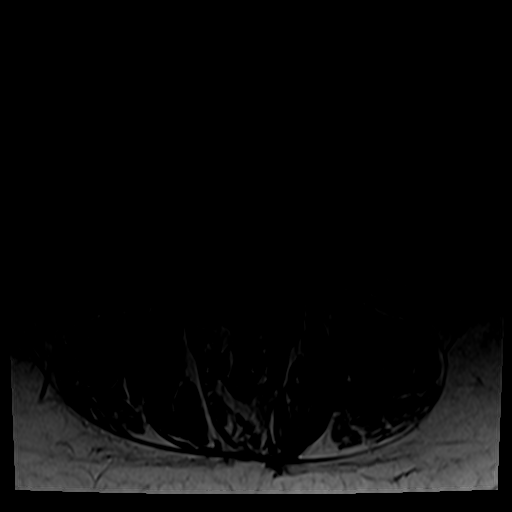
[im 28/34]
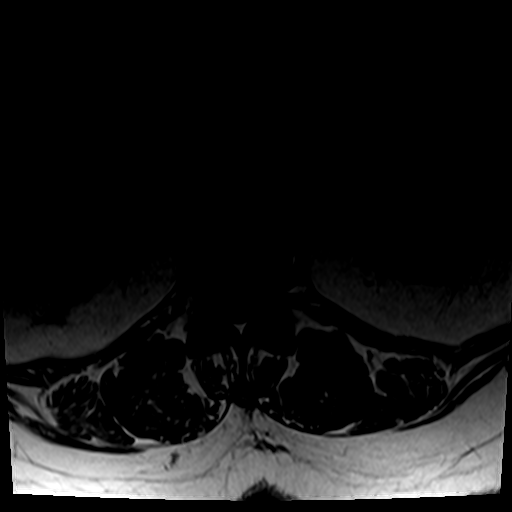

[24 of 48 positions shown; findings below may reference images not displayed]

FINDINGS: Segmentation:  5 lumbar type vertebral bodies.

Alignment:  4 mm anterolisthesis L3-4.  3 mm anterolisthesis L4-5.

Vertebrae: No fracture or primary bone lesion. Discogenic endplate
marrow changes at L3-4 and L4-5 with some edematous change at L4-5.

Conus medullaris and cauda equina: Conus extends to the L1 level.
Conus and cauda equina appear normal.

Paraspinal and other soft tissues: Negative

Disc levels:

No significant finding at L1-2 or above. Minimal non-compressive
disc bulges.

L2-3: Moderate disc bulge. Slight indentation of the thecal sac but
no apparent compressive stenosis. Mild bilateral facet degeneration.

L3-4: Previous posterior decompression. Chronic facet arthropathy
with 4 mm of anterolisthesis. Chronic disc degeneration with loss of
disc height. Endplate osteophytes. No compressive canal stenosis.
Bilateral foraminal stenosis could affect either L3 nerve.

L4-5: Previous posterior decompression. Large broad-based disc
herniation. Extruded disc measures 21 x 8 mm. This causes flattening
of the thecal sac. Bilateral proximal foraminal encroachment could
affect the L4 nerves.

L5-S1: Endplate osteophytes and bulging of the disc. Facet and
ligamentous hypertrophy. No compressive canal stenosis. Foraminal
narrowing left more than right with some potential this could affect
the left L5 nerve.
IMPRESSION: L4-5: 3 mm degenerative anterolisthesis. Previous posterior
decompression. Large disc herniation with a 21 x 8 mm fragment.
Spinal stenosis at this level that could cause neural compression on
either or both sides.

L3-4: Previous posterior decompression. 4 mm degenerative
anterolisthesis. Bilateral foraminal stenosis that could affect the
exiting L3 nerves.

L5-S1: Spondylosis and facet arthropathy. Foraminal stenosis left
worse than right that could affect the L5 nerves, particularly the
left.

L2-3: Disc bulge.  No compressive stenosis.

## 2019-12-02 ENCOUNTER — Telehealth: Payer: Self-pay | Admitting: Family Medicine

## 2019-12-02 NOTE — Telephone Encounter (Signed)
Left message on voice mail to call back regarding MRI results.

## 2019-12-02 NOTE — Telephone Encounter (Signed)
Lumbar MRI scan shows a large disc herniation at L4-5 which causes significant narrowing of the spinal canal and probably affects the L4 nerves.  The nerve openings are also narrowed at L3-4 on both sides which could affect the L3 nerves.

## 2019-12-06 ENCOUNTER — Telehealth: Payer: Self-pay | Admitting: Family Medicine

## 2019-12-06 NOTE — Telephone Encounter (Signed)
Pt called in returning Terri's call from Friday and asked Terri give her a call. Pt states the best time for a call back would be around noon.  [phone]

## 2019-12-06 NOTE — Telephone Encounter (Signed)
I advised the patient of her MRI results. The ESI did not help at all. She is doing PT and home exercises, and would like to continue this for 3-4 weeks if she can. The patient does report new pain going down both legs, making it difficult to walk/get around. She wonders if she should go ahead and get the referral to a surgeon in the works, in case it takes a while to be seen. Please advise on to whom you would recommend she be referred.

## 2019-12-06 NOTE — Telephone Encounter (Signed)
I called the patient regarding her MRI results. See other message from today on this.

## 2019-12-06 NOTE — Telephone Encounter (Signed)
Can we get her in with Kristin Gilmore or Kristin Gilmore in a reasonable amount of time?

## 2019-12-07 NOTE — Telephone Encounter (Signed)
Patient has been scheduled for a consult with Dr. Lorin Mercy on 3/19 at 2:30 pm.

## 2019-12-08 ENCOUNTER — Encounter: Payer: Self-pay | Admitting: Physical Therapy

## 2019-12-08 ENCOUNTER — Ambulatory Visit (INDEPENDENT_AMBULATORY_CARE_PROVIDER_SITE_OTHER): Payer: Medicare Other | Admitting: Physical Therapy

## 2019-12-08 ENCOUNTER — Other Ambulatory Visit: Payer: Self-pay

## 2019-12-08 DIAGNOSIS — M25671 Stiffness of right ankle, not elsewhere classified: Secondary | ICD-10-CM

## 2019-12-08 DIAGNOSIS — M25571 Pain in right ankle and joints of right foot: Secondary | ICD-10-CM | POA: Diagnosis not present

## 2019-12-08 DIAGNOSIS — M6281 Muscle weakness (generalized): Secondary | ICD-10-CM

## 2019-12-08 DIAGNOSIS — R2689 Other abnormalities of gait and mobility: Secondary | ICD-10-CM

## 2019-12-08 DIAGNOSIS — M545 Low back pain, unspecified: Secondary | ICD-10-CM

## 2019-12-08 DIAGNOSIS — R262 Difficulty in walking, not elsewhere classified: Secondary | ICD-10-CM | POA: Diagnosis not present

## 2019-12-08 DIAGNOSIS — G8929 Other chronic pain: Secondary | ICD-10-CM

## 2019-12-08 NOTE — Therapy (Signed)
[place], [place], [address] Phone: [phone]   Fax:  [phone]  Physical Therapy Treatment  Patient Details  Name: Kristin Gilmore MRN: [id number] Date of Birth: 1951/05/15 Referring Provider (PT): Dr. Junius Roads   Encounter Date: 12/08/2019  PT End of Session - 12/08/19 1157    Visit Number  2    Number of Visits  12    Date for PT Re-Evaluation  01/11/20    Authorization Type  Medicare    Authorization Time Period  Cert 1/88/41    PT Start Time  1145    PT Stop Time  1235    PT Time Calculation (min)  50 min    Activity Tolerance  Patient tolerated treatment well    Behavior During Therapy  [facility] for tasks assessed/performed       Past Medical History:  Diagnosis Date  . Arthritis   . Depression   . Dyslipidemia   . Hypertension   . Migraine   . Migraine without aura, without mention of intractable migraine without mention of status migrainosus 04/13/2014  . Pituitary microadenoma ([place]) 04/13/2014  . Pre-diabetes    "i was told i was pre-diabetic a while ago"  . Tremor 04/13/2014    Past Surgical History:  Procedure Laterality Date  . CHOLECYSTECTOMY    . COLONOSCOPY W/ POLYPECTOMY    . LUMBAR LAMINECTOMY/DECOMPRESSION MICRODISCECTOMY N/A 12/19/2017   Procedure: LAMINECTOMY LUMBAR TWO- LUMBAR THREE, LUMBAR THREE- LUMBAR FOUR, LUMBAR FOUR- LUMBAR FIVE ;  Surgeon: Consuella Lose, MD;  Location: [place];  Service: Neurosurgery;  Laterality: N/A;  . NASAL SINUS SURGERY    . TONSILLECTOMY    . WISDOM TOOTH EXTRACTION      There were no vitals filed for this visit.  Subjective Assessment - 12/08/19 1149    Subjective  Pt arriving to therapy reporting 7/10 low back pain. Pt reports she has been consistent with her HEP. Pt reporting worsening pain at night. Pt stating that she schduled to see Dr Lorin Mercy next Friday to f/u about surgery.    Limitations  Standing;Walking    Diagnostic tests  xray.  MRI to be  performed tomorrow.    Currently in Pain?  Yes    Pain Score  7     Pain Location  Back    Pain Orientation  Lower    Pain Descriptors / Indicators  Aching    Pain Onset  More than a month ago    Pain Frequency  Constant                       [facility] Adult PT Treatment/Exercise - 12/08/19 0001      Exercises   Exercises  Lumbar      Lumbar Exercises: Stretches   Passive Hamstring Stretch  Right;Left;3 reps;20 seconds    Single Knee to Chest Stretch  Right;Left;3 reps;20 seconds    Lower Trunk Rotation  5 reps    Standing Extension  5 reps      Lumbar Exercises: Supine   Ab Set  10 reps    Glut Set  10 reps    Glut Set Limitations  holding 5 seconds    Clam  10 reps;3 seconds      Modalities   Modalities  Moist Heat      Moist Heat Therapy   Number Minutes Moist Heat  10 Minutes    Moist Heat Location  Lumbar Spine  Manual Therapy   Manual Therapy  Joint mobilization;Soft tissue mobilization    Joint Mobilization  prone L2-L4 G2-g3 cPA    Soft tissue mobilization  IASTM to lumbar paraspinals and glutes                  PT Long Term Goals - 12/08/19 1218      PT LONG TERM GOAL #1   Title  Patient will demonstrate/report pain at worst less than or equal to 2/10 to facilitate minimal limitation in daily activity secondary to pain symptoms.    Time  6    Period  Weeks    Status  On-going      PT LONG TERM GOAL #2   Title  Patient will demonstrate independent use of home exercise program to facilitate ability to maintain/progress functional gains from skilled physical therapy services.    Time  6    Period  Weeks    Status  On-going      PT LONG TERM GOAL #3   Title  Patient will demonstrate LRAD ambulation community distances > 300 ft to facilitate community integration at [facility].    Time  6    Period  Weeks    Status  On-going      PT LONG TERM GOAL #4   Title  Patient will demonstrate lumbar extension 75 or greater % WFL s symptoms  to facilitate upright standing, walking posture at PLOF s limitation.    Period  Weeks    Status  On-going            Plan - 12/08/19 1158    Clinical Impression Statement  Pt presenting today with 7-8/10 pain in her low back. Pt with limited tolerance to exrecises and stretches due to pain. Pt needed verbal instructions for exercise technique. No goals met at this session. Continue skilled PT.    Personal Factors and Comorbidities  Age;Past/Current Experience;Comorbidity 1;Other    Comorbidities  Previous lumbar surgery    Examination-Activity Limitations  Bed Mobility;Bend;Stand;Squat;Transfers;Other    Examination-Participation Restrictions  Cleaning;Meal Prep;Community Activity    Stability/Clinical Decision Making  Stable/Uncomplicated    Rehab Potential  Fair    PT Frequency  2x / week    PT Duration  6 weeks    PT Treatment/Interventions  ADLs/Self Care Home Management;Cryotherapy;Electrical Stimulation;Ultrasound;Traction;Moist Heat;Gait training;Stair training;Functional mobility training;Therapeutic activities;Therapeutic exercise;Balance training;Neuromuscular re-education;Manual techniques;Patient/family education;Passive range of motion;Dry needling;Joint Manipulations;Spinal Manipulations;Taping    PT Next Visit Plan  HEP review, lumbar mobility intervention    PT Home Exercise Plan  [id number]    Consulted and Agree with Plan of Care  Patient       Patient will benefit from skilled therapeutic intervention in order to improve the following deficits and impairments:  Decreased mobility, Decreased strength, Postural dysfunction, Hypomobility, Decreased balance, Pain, Decreased activity tolerance, Increased muscle spasms, Difficulty walking, Decreased range of motion, Abnormal gait  Visit Diagnosis: Chronic low back pain, unspecified back pain laterality, unspecified whether sciatica present  Muscle weakness (generalized)  Difficulty in walking, not elsewhere  classified  Pain in right ankle and joints of right foot  Stiffness of right ankle, not elsewhere classified  Other abnormalities of gait and mobility     Problem List Patient Active Problem List   Diagnosis Date Noted  . Lumbar spinal stenosis 12/19/2017  . Pain in joint, ankle and foot 05/02/2016  . Chronic venous insufficiency 05/02/2016  . Peripheral edema 11/01/2015  . Intractable chronic migraine without aura 11/02/2014  .  Tremor 04/13/2014  . Migraine without aura 04/13/2014  . Pituitary microadenoma ([place]) 04/13/2014    Oretha Caprice, PT 12/08/2019, 12:21 PM  [facility] [address] [place], [place], [address] Phone: [phone]   Fax:  [phone]  Name: LAMA NARAYANAN MRN: [id number] Date of Birth: 1951/07/28

## 2019-12-10 ENCOUNTER — Encounter: Payer: Medicare Other | Admitting: Rehabilitative and Restorative Service Providers"

## 2019-12-13 ENCOUNTER — Encounter: Payer: Medicare Other | Admitting: Rehabilitative and Restorative Service Providers"

## 2019-12-14 ENCOUNTER — Other Ambulatory Visit: Payer: Self-pay

## 2019-12-14 ENCOUNTER — Ambulatory Visit (INDEPENDENT_AMBULATORY_CARE_PROVIDER_SITE_OTHER): Payer: Medicare Other | Admitting: Physical Therapy

## 2019-12-14 ENCOUNTER — Encounter: Payer: Self-pay | Admitting: Physical Therapy

## 2019-12-14 DIAGNOSIS — M6281 Muscle weakness (generalized): Secondary | ICD-10-CM

## 2019-12-14 DIAGNOSIS — M25571 Pain in right ankle and joints of right foot: Secondary | ICD-10-CM | POA: Diagnosis not present

## 2019-12-14 DIAGNOSIS — G8929 Other chronic pain: Secondary | ICD-10-CM

## 2019-12-14 DIAGNOSIS — R2689 Other abnormalities of gait and mobility: Secondary | ICD-10-CM

## 2019-12-14 DIAGNOSIS — R262 Difficulty in walking, not elsewhere classified: Secondary | ICD-10-CM

## 2019-12-14 DIAGNOSIS — M545 Low back pain, unspecified: Secondary | ICD-10-CM

## 2019-12-14 DIAGNOSIS — M25671 Stiffness of right ankle, not elsewhere classified: Secondary | ICD-10-CM

## 2019-12-14 NOTE — Therapy (Signed)
[place], [place], [address] Phone: [phone]   Fax:  [phone]  Physical Therapy Treatment  Patient Details  Name: Kristin Gilmore MRN: [id number] Date of Birth: 11-23-1950 Referring Provider (PT): Dr. Junius Roads   Encounter Date: 12/14/2019  PT End of Session - 12/14/19 1156    Visit Number  3    Number of Visits  12    Date for PT Re-Evaluation  01/11/20    Authorization Type  Medicare    Authorization Time Period  Cert [id number]    PT Start Time  1145    PT Stop Time  1235    PT Time Calculation (min)  50 min    Activity Tolerance  Patient tolerated treatment well    Behavior During Therapy  [facility] for tasks assessed/performed       Past Medical History:  Diagnosis Date  . Arthritis   . Depression   . Dyslipidemia   . Hypertension   . Migraine   . Migraine without aura, without mention of intractable migraine without mention of status migrainosus 04/13/2014  . Pituitary microadenoma ([place]) 04/13/2014  . Pre-diabetes    "i was told i was pre-diabetic a while ago"  . Tremor 04/13/2014    Past Surgical History:  Procedure Laterality Date  . CHOLECYSTECTOMY    . COLONOSCOPY W/ POLYPECTOMY    . LUMBAR LAMINECTOMY/DECOMPRESSION MICRODISCECTOMY N/A 12/19/2017   Procedure: LAMINECTOMY LUMBAR TWO- LUMBAR THREE, LUMBAR THREE- LUMBAR FOUR, LUMBAR FOUR- LUMBAR FIVE ;  Surgeon: Consuella Lose, MD;  Location: [facility];  Service: Neurosurgery;  Laterality: N/A;  . NASAL SINUS SURGERY    . TONSILLECTOMY    . WISDOM TOOTH EXTRACTION      There were no vitals filed for this visit.  Subjective Assessment - 12/14/19 1152    Subjective  Pt arriving to therapy reporting 7-8/10 pain. Pt reports she didn't do her HEP yesterday but she reported doing her leg bike while sitting.    Limitations  Standing;Walking    Diagnostic tests  xray.  MRI to be performed tomorrow.    Patient Stated Goals  Reduce pain    Currently in Pain?   Yes    Pain Score  8     Pain Orientation  Lower    Pain Descriptors / Indicators  Aching    Pain Type  Chronic pain    Pain Onset  More than a month ago                       [facility] Adult PT Treatment/Exercise - 12/14/19 0001      Exercises   Exercises  Lumbar      Lumbar Exercises: Stretches   Passive Hamstring Stretch  Right;Left;3 reps;20 seconds    Single Knee to Chest Stretch  Right;Left;3 reps;20 seconds    Lower Trunk Rotation  5 reps    Lower Trunk Rotation Limitations  seated, pt states it was harded to turn to left side    Standing Extension  5 reps      Lumbar Exercises: Aerobic   Nustep  L3 x 5 minutes      Lumbar Exercises: Standing   Heel Raises  10 reps;Limitations    Heel Raises Limitations  UE support    Row  Strengthening    Theraband Level (Row)  Level 2 (Red)      Lumbar Exercises: Seated   Long Engineer, building services;Both  Other Seated Lumbar Exercises  hip flexion x 10 each LE alternating      Lumbar Exercises: Supine   Ab Set  10 reps    Glut Set  10 reps      Modalities   Modalities  Moist Heat      Moist Heat Therapy   Number Minutes Moist Heat  8 Minutes    Moist Heat Location  Lumbar Spine      Manual Therapy   Manual Therapy  Joint mobilization;Soft tissue mobilization    Joint Mobilization  prone L2-L4 G2-g3 cPA    Soft tissue mobilization  IASTM to lumbar paraspinals and glutes             PT Education - 12/14/19 1156    Education Details  reviewed HEP    Person(s) Educated  Patient    Methods  Explanation    Comprehension  Verbalized understanding          PT Long Term Goals - 12/14/19 1205      PT LONG TERM GOAL #1   Title  Patient will demonstrate/report pain at worst less than or equal to 2/10 to facilitate minimal limitation in daily activity secondary to pain symptoms.    Time  6    Period  Weeks    Status  On-going      PT LONG TERM GOAL #2   Title  Patient will demonstrate  independent use of home exercise program to facilitate ability to maintain/progress functional gains from skilled physical therapy services.    Time  6    Period  Weeks    Status  On-going      PT LONG TERM GOAL #3   Title  Patient will demonstrate LRAD ambulation community distances > 300 ft to facilitate community integration at [facility].    Time  6    Period  Weeks    Status  On-going      PT LONG TERM GOAL #4   Title  Patient will demonstrate lumbar extension 75 or greater % WFL s symptoms to facilitate upright standing, walking posture at PLOF s limitation.    Time  6    Period  Weeks    Status  On-going      PT LONG TERM GOAL #5   Title  Pt. will demonstrate ability to return to usual water aerobics s restriction to/from [facility].    Time  6    Period  Weeks    Status  New      PT LONG TERM GOAL #6   Title  Pt. will demonstrate bilateral knee MMT 5/5 throughout to facilitate transfers, standing, walking at PLOF.    Time  6    Period  Weeks    Status  On-going            Plan - 12/14/19 1157    Clinical Impression Statement  Pt presenting today with 8/10 low back pain. Pt with limited tolerance to exercises due to pain. Pt reporting decrease in pain after standing extension and standing rows however pt requring sitting rest breaks intermittenlty. Pt was also instructed to stand upright when walking to help with balance.  Pt requesting IASTM again. Pt reporting relief after STM.Continue skilled PT.    Personal Factors and Comorbidities  Age;Past/Current Experience;Comorbidity 1;Other    Comorbidities  Previous lumbar surgery    Examination-Activity Limitations  Bed Mobility;Bend;Stand;Squat;Transfers;Other    Examination-Participation Restrictions  Cleaning;Meal Prep;Community Activity    Stability/Clinical Decision  Making  Stable/Uncomplicated    Rehab Potential  Fair    PT Frequency  2x / week    PT Duration  6 weeks    PT Treatment/Interventions  ADLs/Self Care Home  Management;Cryotherapy;Electrical Stimulation;Ultrasound;Traction;Moist Heat;Gait training;Stair training;Functional mobility training;Therapeutic activities;Therapeutic exercise;Balance training;Neuromuscular re-education;Manual techniques;Patient/family education;Passive range of motion;Dry needling;Joint Manipulations;Spinal Manipulations;Taping    PT Next Visit Plan  HEP review, lumbar mobility intervention    PT Home Exercise Plan  [id number]    Consulted and Agree with Plan of Care  Patient       Patient will benefit from skilled therapeutic intervention in order to improve the following deficits and impairments:  Decreased mobility, Decreased strength, Postural dysfunction, Hypomobility, Decreased balance, Pain, Decreased activity tolerance, Increased muscle spasms, Difficulty walking, Decreased range of motion, Abnormal gait  Visit Diagnosis: Chronic low back pain, unspecified back pain laterality, unspecified whether sciatica present  Muscle weakness (generalized)  Difficulty in walking, not elsewhere classified  Pain in right ankle and joints of right foot  Stiffness of right ankle, not elsewhere classified  Other abnormalities of gait and mobility     Problem List Patient Active Problem List   Diagnosis Date Noted  . Lumbar spinal stenosis 12/19/2017  . Pain in joint, ankle and foot 05/02/2016  . Chronic venous insufficiency 05/02/2016  . Peripheral edema 11/01/2015  . Intractable chronic migraine without aura 11/02/2014  . Tremor 04/13/2014  . Migraine without aura 04/13/2014  . Pituitary microadenoma ([place]) 04/13/2014    Oretha Caprice, PT 12/14/2019, 12:33 PM  [facility] Physical Therapy [address] [place], [place], [address] Phone: [phone]   Fax:  [phone]  Name: Kristin Gilmore MRN: [id number] Date of Birth: 1950-11-21

## 2019-12-15 ENCOUNTER — Encounter: Payer: Medicare Other | Admitting: Rehabilitative and Restorative Service Providers"

## 2019-12-16 ENCOUNTER — Encounter: Payer: Medicare Other | Admitting: Physical Therapy

## 2019-12-17 ENCOUNTER — Ambulatory Visit (INDEPENDENT_AMBULATORY_CARE_PROVIDER_SITE_OTHER): Payer: Medicare Other | Admitting: Orthopaedic Surgery

## 2019-12-17 ENCOUNTER — Encounter: Payer: Self-pay | Admitting: Orthopaedic Surgery

## 2019-12-17 ENCOUNTER — Other Ambulatory Visit: Payer: Self-pay

## 2019-12-17 VITALS — BP 138/70 | HR 82 | Ht 67.5 in | Wt 185.0 lb

## 2019-12-17 DIAGNOSIS — M5136 Other intervertebral disc degeneration, lumbar region: Secondary | ICD-10-CM

## 2019-12-20 ENCOUNTER — Encounter: Payer: Medicare Other | Admitting: Rehabilitative and Restorative Service Providers"

## 2019-12-20 NOTE — Progress Notes (Signed)
Office Visit Note   Patient: Kristin Gilmore           Date of Birth: 02-18-1951           MRN: [id number] Visit Date: 12/17/2019              Requested by: Aletha Halim., PA-C [address],  [place] [address] PCP: Aletha Halim., PA-C   Assessment & Plan: Visit Diagnoses:  1. DDD (degenerative disc disease), lumbar     Plan: Patient's got chronic pain.  She has some anterolisthesis lumbar region after decompression L3-4 L4-5.  The apparent large disc herniation is actually an old calcified disc fragment.  It was present after the surgery but not before the 2019 surgery.  It is completely ossified and should not be causing acute compression.  We will set her up for EMG nerve conduction velocities to evaluate her for continued back pain and left leg pain and weakness.  With her past surgery she had a dural tear with repair.  She does not have motor weakness on exam.  Follow-up after electrical tests.  Follow-Up Instructions: No follow-ups on file.   Orders:  Orders Placed This Encounter  Procedures  . Ambulatory referral to Neurology   No orders of the defined types were placed in this encounter.     Procedures: No procedures performed   Clinical Data: No additional findings.   Subjective: Chief Complaint  Patient presents with  . Lower Back - Pain    HPI 69 year old female first-time visit by me after seeing Dr. Junius Roads for evaluation of possible surgery for complex lumbar problem.  She has had a history of chronic back pain for years.  She has had numerous lumbar injections without improvement.  Previous surgery L2-L5 laminectomy by Dr. Kathyrn Sheriff with Dr. Valentina Lucks assisting March 2019.  Patient has mild durotomy which was repaired.  Patient states she has chronic pain she was in pain clinic and states no one had called her back for problems and she states she was unhappy raising her voice and was terminated from their clinic due to behavior issues.   Patient states she has pain in the morning with bilateral lower extremity numbness and spasms.  Pain gets better as the day goes on.  She has increased pain when she goes the bathroom.  She has been on anti-inflammatories as well as muscle relaxants and has not gotten a lot of relief.  Patient is seen here today with her husband.  [place] narcotic site review shows last hydrocodone prescription 04/15/2019.  She been on oxycodone fives and also hydrocodone in the past after her lumbar procedure.  MRI scan listed below showed apparent large disc herniation L4-5 however previous CT scan shows this is actually old and a calcified disc protrusion at L4-5 with posterior decompression.  There are chronic degenerative changes at the facets multilevel L3-4 and L4-5.  Review of Systems plans for migraines venous insufficiency.  History of pituitary microadenoma.  History of lumbar spinal stenosis multilevel.   Objective: Vital Signs: BP 138/70   Pulse 82   Ht 5' 7.5" (1.715 m)   Wt 185 lb (83.9 kg)   BMI 28.55 kg/m   Physical Exam Constitutional:      Appearance: She is well-developed.  HENT:     Head: Normocephalic.     Right Ear: External ear normal.     Left Ear: External ear normal.  Eyes:     Pupils: Pupils are  equal, round, and reactive to light.  Neck:     Thyroid: No thyromegaly.     Trachea: No tracheal deviation.  Cardiovascular:     Rate and Rhythm: Normal rate.  Pulmonary:     Effort: Pulmonary effort is normal.  Abdominal:     Palpations: Abdomen is soft.  Skin:    General: Skin is warm and dry.  Neurological:     Mental Status: She is alert and oriented to person, place, and time.  Psychiatric:        Behavior: Behavior normal.     Ortho Exam patient has some discomfort with straight leg raising 90 degrees.  Negative hip range of motion.  Patient is in a wheelchair she got at the front door.  Anterior tib gastrocsoleus are active with good strength.  Mild lower  extremity edema is noted right and left.  Specialty Comments:  No specialty comments available.  Imaging: CLINICAL DATA:  Low back pain worsening over the last 6 months.  EXAM: MRI LUMBAR SPINE WITHOUT CONTRAST  TECHNIQUE: Multiplanar, multisequence MR imaging of the lumbar spine was performed. No intravenous contrast was administered.  COMPARISON:  Myelogram 01/23/2015  FINDINGS: Segmentation:  5 lumbar type vertebral bodies.  Alignment:  4 mm anterolisthesis L3-4.  3 mm anterolisthesis L4-5.  Vertebrae: No fracture or primary bone lesion. Discogenic endplate marrow changes at L3-4 and L4-5 with some edematous change at L4-5.  Conus medullaris and cauda equina: Conus extends to the L1 level. Conus and cauda equina appear normal.  Paraspinal and other soft tissues: Negative  Disc levels:  No significant finding at L1-2 or above. Minimal non-compressive disc bulges.  L2-3: Moderate disc bulge. Slight indentation of the thecal sac but no apparent compressive stenosis. Mild bilateral facet degeneration.  L3-4: Previous posterior decompression. Chronic facet arthropathy with 4 mm of anterolisthesis. Chronic disc degeneration with loss of disc height. Endplate osteophytes. No compressive canal stenosis. Bilateral foraminal stenosis could affect either L3 nerve.  L4-5: Previous posterior decompression. Large broad-based disc herniation. Extruded disc measures 21 x 8 mm. This causes flattening of the thecal sac. Bilateral proximal foraminal encroachment could affect the L4 nerves.  L5-S1: Endplate osteophytes and bulging of the disc. Facet and ligamentous hypertrophy. No compressive canal stenosis. Foraminal narrowing left more than right with some potential this could affect the left L5 nerve.  IMPRESSION: L4-5: 3 mm degenerative anterolisthesis. Previous posterior decompression. Large disc herniation with a 21 x 8 mm fragment. Spinal stenosis at this  level that could cause neural compression on either or both sides.  L3-4: Previous posterior decompression. 4 mm degenerative anterolisthesis. Bilateral foraminal stenosis that could affect the exiting L3 nerves.  L5-S1: Spondylosis and facet arthropathy. Foraminal stenosis left worse than right that could affect the L5 nerves, particularly the left.  L2-3: Disc bulge.  No compressive stenosis.   Electronically Signed   By: Nelson Chimes M.D.   On: 12/01/2019 22:48 CLINICAL DATA:  Left lower quadrant abdominal pain.  EXAM: CT ABDOMEN AND PELVIS WITH CONTRAST  TECHNIQUE: Multidetector CT imaging of the abdomen and pelvis was performed using the standard protocol following bolus administration of intravenous contrast.  CONTRAST:  78mL OMNIPAQUE IOHEXOL 300 MG/ML SOLN, 90mL OMNIPAQUE IOHEXOL 300 MG/ML SOLN  COMPARISON:  CT scan dated 05/31/2015  FINDINGS: Lower chest: No acute abnormality.  Hepatobiliary: Increased hepatomegaly with diffuse hepatic steatosis. No focal lesions. Cholecystectomy. No dilated bile ducts.  Pancreas: Unremarkable. No pancreatic ductal dilatation or surrounding inflammatory changes.  Spleen:  Normal in size without focal abnormality.  Adrenals/Urinary Tract: Adrenal glands are normal. Stable tiny cyst in the upper pole of the left kidney. The right kidney is normal except that is pushed inferiorly due to the hepatomegaly. No hydronephrosis. Bladder is normal.  Stomach/Bowel: Stomach is within normal limits. Appendix appears normal. No evidence of bowel wall thickening, distention, or inflammatory changes. Specifically, no evidence of diverticulosis or diverticulitis.  Vascular/Lymphatic: Aortic atherosclerosis. No enlarged abdominal or pelvic lymph nodes.  Reproductive: Uterus and bilateral adnexa are unremarkable.  Other: No abdominal wall hernia or abnormality. No abdominopelvic ascites.  Musculoskeletal: Large  calcified disc protrusion at L4-5 which compresses the thecal sac despite the posterior decompression. Severe chronic degenerative disc disease at L3-4 through L5-S1. Previous posterior decompression at L3-4 and L4-5.  IMPRESSION: 1. No acute abnormalities of the abdomen or pelvis. Specifically, no evidence of diverticulosis or diverticulitis. 2. Increased hepatomegaly with diffuse hepatic steatosis. 3. Large calcified chronic disc protrusion at L4-5 as described above.  Aortic Atherosclerosis (ICD10-I70.0).   Electronically Signed   By: Lorriane Shire M.D.   On: 07/20/2019 18:33  PMFS History: Patient Active Problem List   Diagnosis Date Noted  . Lumbar spinal stenosis 12/19/2017  . Pain in joint, ankle and foot 05/02/2016  . Chronic venous insufficiency 05/02/2016  . Peripheral edema 11/01/2015  . Intractable chronic migraine without aura 11/02/2014  . Tremor 04/13/2014  . Migraine without aura 04/13/2014  . Pituitary microadenoma ([place]) 04/13/2014   Past Medical History:  Diagnosis Date  . Arthritis   . Depression   . Dyslipidemia   . Hypertension   . Migraine   . Migraine without aura, without mention of intractable migraine without mention of status migrainosus 04/13/2014  . Pituitary microadenoma ([place]) 04/13/2014  . Pre-diabetes    "i was told i was pre-diabetic a while ago"  . Tremor 04/13/2014    Family History  Problem Relation Age of Onset  . Cancer Father        stomach cancer  . Migraines Mother     Past Surgical History:  Procedure Laterality Date  . CHOLECYSTECTOMY    . COLONOSCOPY W/ POLYPECTOMY    . LUMBAR LAMINECTOMY/DECOMPRESSION MICRODISCECTOMY N/A 12/19/2017   Procedure: LAMINECTOMY LUMBAR TWO- LUMBAR THREE, LUMBAR THREE- LUMBAR FOUR, LUMBAR FOUR- LUMBAR FIVE ;  Surgeon: Consuella Lose, MD;  Location: [facility];  Service: Neurosurgery;  Laterality: N/A;  . NASAL SINUS SURGERY    . TONSILLECTOMY    . WISDOM TOOTH EXTRACTION     Social  History   Occupational History  . Occupation: Retired  Tobacco Use  . Smoking status: Never Smoker  . Smokeless tobacco: Never Used  Substance and Sexual Activity  . Alcohol use: Yes    Alcohol/week: 0.0 standard drinks    Comment: occas.  . Drug use: No  . Sexual activity: Not on file

## 2019-12-21 ENCOUNTER — Encounter: Payer: Medicare Other | Admitting: Physical Therapy

## 2019-12-22 ENCOUNTER — Encounter: Payer: Medicare Other | Admitting: Rehabilitative and Restorative Service Providers"

## 2019-12-22 ENCOUNTER — Other Ambulatory Visit: Payer: Self-pay | Admitting: Neurology

## 2019-12-23 ENCOUNTER — Telehealth: Payer: Self-pay | Admitting: Orthopaedic Surgery

## 2019-12-23 ENCOUNTER — Encounter: Payer: Medicare Other | Admitting: Physical Therapy

## 2019-12-23 NOTE — Telephone Encounter (Signed)
Those are usually the two locations that I'm aware of that does this study.  I have called and checked with [place] Dr. Romona Curls assistant if they had other location recommendations. She will check with Dr. Ernestina Patches in the morning of any other possible facility.

## 2019-12-23 NOTE — Telephone Encounter (Signed)
Patient called.   She is wondering if she could be sent somewhere else for the nerve study because the facility she was referred to cannot get her in until 01/25/2020  Call back: [phone]

## 2019-12-23 NOTE — Telephone Encounter (Signed)
Are either of you aware of somewhere else that may do bilateral lower extremity EMG/NCV? I put order in for [facility] as Dr. Posey Pronto at [facility] was going out on maternity leave.

## 2019-12-24 NOTE — Telephone Encounter (Signed)
I left voicemail for patient advising I can try one more physician's office ([facility]) to see if they are able to get her in sooner. I will call her back on Monday to advise.

## 2019-12-27 ENCOUNTER — Encounter: Payer: Medicare Other | Admitting: Physical Therapy

## 2019-12-27 ENCOUNTER — Encounter: Payer: Medicare Other | Admitting: Rehabilitative and Restorative Service Providers"

## 2019-12-28 ENCOUNTER — Telehealth: Payer: Self-pay | Admitting: Orthopaedic Surgery

## 2019-12-28 NOTE — Telephone Encounter (Signed)
Patient called Kristin Gilmore back. Stated call her back when you can.

## 2019-12-28 NOTE — Telephone Encounter (Signed)
I called and spoke with patient. She is unable to get the lower extremity EMG/NCS done until 01/24/2020 due to facilities not having anything sooner. She states that she needs something for the pain that she is having. She is unable to sleep due to the horrific pain that she is in. She is doing the exercises PT has taught her daily but has missed PT some this week due to pain. She is currently taking Relafen 750mg , Gabapentin 300mg , Tizanidine 2mg , and Tylenol Arthritis 650mg .  She requests something be sent in for pain to [facility] on [address].  Please advise.

## 2019-12-28 NOTE — Telephone Encounter (Signed)
1st available appt for patient is with [facility] on 4/26. Patient is aware.

## 2019-12-29 ENCOUNTER — Encounter: Payer: Medicare Other | Admitting: Rehabilitative and Restorative Service Providers"

## 2019-12-29 NOTE — Telephone Encounter (Signed)
Tried to call her back times 2 in last 24 hrs , she is not there.

## 2019-12-29 NOTE — Telephone Encounter (Signed)
noted 

## 2019-12-30 ENCOUNTER — Encounter: Payer: Medicare Other | Admitting: Physical Therapy

## 2019-12-31 ENCOUNTER — Other Ambulatory Visit: Payer: Self-pay | Admitting: Family Medicine

## 2020-01-03 ENCOUNTER — Encounter: Payer: Medicare Other | Admitting: Rehabilitative and Restorative Service Providers"

## 2020-01-04 ENCOUNTER — Encounter: Payer: Medicare Other | Admitting: Rehabilitative and Restorative Service Providers"

## 2020-01-05 ENCOUNTER — Encounter: Payer: Medicare Other | Admitting: Rehabilitative and Restorative Service Providers"

## 2020-01-06 ENCOUNTER — Encounter: Payer: Medicare Other | Admitting: Physical Therapy

## 2020-01-10 ENCOUNTER — Other Ambulatory Visit: Payer: Self-pay | Admitting: Neurology

## 2020-01-10 ENCOUNTER — Telehealth: Payer: Self-pay | Admitting: Physical Therapy

## 2020-01-10 NOTE — Telephone Encounter (Signed)
Attempted to return pt's call; unable to reach her and no voicemail set up.  Will attempt again.  Laureen Abrahams, PT, DPT 01/10/20 11:22 AM

## 2020-01-12 ENCOUNTER — Ambulatory Visit (INDEPENDENT_AMBULATORY_CARE_PROVIDER_SITE_OTHER): Payer: Medicare Other | Admitting: Physical Therapy

## 2020-01-12 ENCOUNTER — Other Ambulatory Visit: Payer: Self-pay

## 2020-01-12 ENCOUNTER — Encounter: Payer: Self-pay | Admitting: Physical Therapy

## 2020-01-12 DIAGNOSIS — M25571 Pain in right ankle and joints of right foot: Secondary | ICD-10-CM | POA: Diagnosis not present

## 2020-01-12 DIAGNOSIS — M545 Low back pain, unspecified: Secondary | ICD-10-CM

## 2020-01-12 DIAGNOSIS — R2689 Other abnormalities of gait and mobility: Secondary | ICD-10-CM

## 2020-01-12 DIAGNOSIS — M25671 Stiffness of right ankle, not elsewhere classified: Secondary | ICD-10-CM

## 2020-01-12 DIAGNOSIS — R262 Difficulty in walking, not elsewhere classified: Secondary | ICD-10-CM | POA: Diagnosis not present

## 2020-01-12 DIAGNOSIS — M6281 Muscle weakness (generalized): Secondary | ICD-10-CM

## 2020-01-12 DIAGNOSIS — G8929 Other chronic pain: Secondary | ICD-10-CM

## 2020-01-12 NOTE — Therapy (Signed)
[facility], [place], [address] Phone: [phone]   Fax:  [phone]  Physical Therapy Treatment  Patient Details  Name: Kristin Gilmore MRN: [id number] Date of Birth: [birth year] Referring Provider (PT): Dr. Junius Roads   Encounter Date: 01/12/2020  PT End of Session - 01/12/20 1233    Visit Number  4    Number of Visits  12    Date for PT Re-Evaluation  03/10/20    Authorization Type  Medicare    Authorization Time Period  new cert sent on 8/24 for 8 additional visits.    PT Start Time  1145    PT Stop Time  1235    PT Time Calculation (min)  50 min    Activity Tolerance  Patient tolerated treatment well    Behavior During Therapy  WFL for tasks assessed/performed       Past Medical History:  Diagnosis Date  . Arthritis   . Depression   . Dyslipidemia   . Hypertension   . Migraine   . Migraine without aura, without mention of intractable migraine without mention of status migrainosus 04/13/2014  . Pituitary microadenoma ([place]) 04/13/2014  . Pre-diabetes    "i was told i was pre-diabetic a while ago"  . Tremor 04/13/2014    Past Surgical History:  Procedure Laterality Date  . CHOLECYSTECTOMY    . COLONOSCOPY W/ POLYPECTOMY    . LUMBAR LAMINECTOMY/DECOMPRESSION MICRODISCECTOMY N/A 12/19/2017   Procedure: LAMINECTOMY LUMBAR TWO- LUMBAR THREE, LUMBAR THREE- LUMBAR FOUR, LUMBAR FOUR- LUMBAR FIVE ;  Surgeon: Consuella Lose, MD;  Location: [facility];  Service: Neurosurgery;  Laterality: N/A;  . NASAL SINUS SURGERY    . TONSILLECTOMY    . WISDOM TOOTH EXTRACTION      There were no vitals filed for this visit.  Subjective Assessment - 01/12/20 1151    Subjective  Pt arriving to therpay reporting 8/10 LBP radiating down L LE. Pt reporting she drove to the clinic today because her husband was having a Parkinson flare up. Pt reporting she has cancelled so many times due to sickness and lack of sleep. Pt reporting her  pain wakes her up at night and she has not been sleeeping fully. Pt also feels that the exercises have not been helping.    Limitations  Standing;Walking    Diagnostic tests  xray.  MRI to be performed tomorrow.    Patient Stated Goals  Reduce pain    Currently in Pain?  Yes    Pain Score  8     Pain Location  Back    Pain Orientation  Lower;Left    Pain Descriptors / Indicators  Aching;Sore    Pain Type  Chronic pain    Pain Onset  More than a month ago    Pain Frequency  Constant                       [facility] Adult PT Treatment/Exercise - 01/12/20 0001      Exercises   Exercises  Lumbar      Lumbar Exercises: Stretches   Passive Hamstring Stretch  Right;Left;2 reps;20 seconds    Lower Trunk Rotation  3 reps      Lumbar Exercises: Aerobic   Nustep  L3 x 5 minutes      Lumbar Exercises: Seated   Other Seated Lumbar Exercises  chair extension x 5 holding 10 seconds      Lumbar Exercises: Supine  Ab Set  10 reps    Other Supine Lumbar Exercises  hip IR and ER isometrics x 3 reps holding 5 seconds      Lumbar Exercises: Sidelying   Other Sidelying Lumbar Exercises  passive hip flexor stretch on L LE 3 reps x 20 seconds      Modalities   Modalities  Moist Heat      Moist Heat Therapy   Number Minutes Moist Heat  8 Minutes    Moist Heat Location  Lumbar Spine   left hip     Manual Therapy   Manual Therapy  Soft tissue mobilization    Manual therapy comments  10 minutes    Soft tissue mobilization  IASTM to lumbar paraspinals, Left IT band and glutes   performed in sidelying on R side            PT Education - 01/12/20 1155    Education Details  Discussed consistency with therapy appointments to maximize benefits.    Person(s) Educated  Patient    Methods  Explanation    Comprehension  Verbalized understanding          PT Long Term Goals - 01/12/20 1246      PT LONG TERM GOAL #1   Title  Patient will demonstrate/report pain at worst  less than or equal to 2/10 to facilitate minimal limitation in daily activity secondary to pain symptoms.    Period  Weeks    Status  On-going    Target Date  03/10/20      PT LONG TERM GOAL #2   Title  Patient will demonstrate independent use of home exercise program to facilitate ability to maintain/progress functional gains from skilled physical therapy services.    Time  6    Period  Weeks    Status  On-going    Target Date  03/10/20      PT LONG TERM GOAL #3   Title  Patient will demonstrate LRAD ambulation community distances > 300 ft to facilitate community integration at [facility].    Period  Weeks    Status  On-going    Target Date  03/10/20      PT LONG TERM GOAL #4   Title  Patient will demonstrate lumbar extension 75 or greater % WFL s symptoms to facilitate upright standing, walking posture at PLOF s limitation.    Time  6    Period  Weeks    Status  On-going    Target Date  03/10/20      PT LONG TERM GOAL #5   Title  Pt. will demonstrate ability to return to usual water aerobics s restriction to/from [facility].    Time  6    Period  Weeks    Status  On-going    Target Date  03/10/20      PT LONG TERM GOAL #6   Title  Pt. will demonstrate bilateral knee MMT 5/5 throughout to facilitate transfers, standing, walking at PLOF.    Time  6    Period  Weeks    Status  On-going    Target Date  03/10/20            Plan - 01/12/20 1230    Clinical Impression Statement  Pt arriving to therapy after not being seen for almost 1 month. Pt reporting 8/10 pain in her low back and down L LE. Pt was edu in importance of consitency with therapy and therapy visits to see  progress. Pt tolerating more stretching and exericses today and ending with IASTM to lumbar paraspinals, left IT band and L gluteals/piriformis. Pt reporting less pain at end of session. Pt was issued updated HEP. No goals met after 4 visits. I am requesting the 8 visits from pt's original POC in an extended time frame  until 03/10/2020.  Continue skilled PT progressing toward more functional movements, core strengthening, Lumbar stretching and extension activities.    Personal Factors and Comorbidities  Age;Past/Current Experience;Comorbidity 1;Other    Comorbidities  Previous lumbar surgery    Examination-Activity Limitations  Bed Mobility;Bend;Stand;Squat;Transfers;Other    Examination-Participation Restrictions  Cleaning;Meal Prep;Community Activity    Stability/Clinical Decision Making  Stable/Uncomplicated    Rehab Potential  Fair    PT Frequency  2x / week    PT Duration  6 weeks    PT Treatment/Interventions  ADLs/Self Care Home Management;Cryotherapy;Electrical Stimulation;Ultrasound;Traction;Moist Heat;Gait training;Stair training;Functional mobility training;Therapeutic activities;Therapeutic exercise;Balance training;Neuromuscular re-education;Manual techniques;Patient/family education;Passive range of motion;Dry needling;Joint Manipulations;Spinal Manipulations;Taping    PT Next Visit Plan  HEP review, lumbar mobility intervention    PT Home Exercise Plan  [id number]    Consulted and Agree with Plan of Care  Patient       Patient will benefit from skilled therapeutic intervention in order to improve the following deficits and impairments:  Decreased mobility, Decreased strength, Postural dysfunction, Hypomobility, Decreased balance, Pain, Decreased activity tolerance, Increased muscle spasms, Difficulty walking, Decreased range of motion, Abnormal gait  Visit Diagnosis: Chronic low back pain, unspecified back pain laterality, unspecified whether sciatica present  Muscle weakness (generalized)  Difficulty in walking, not elsewhere classified  Pain in right ankle and joints of right foot  Stiffness of right ankle, not elsewhere classified  Other abnormalities of gait and mobility     Problem List Patient Active Problem List   Diagnosis Date Noted  . Lumbar spinal stenosis 12/19/2017  .  Pain in joint, ankle and foot 05/02/2016  . Chronic venous insufficiency 05/02/2016  . Peripheral edema 11/01/2015  . Intractable chronic migraine without aura 11/02/2014  . Tremor 04/13/2014  . Migraine without aura 04/13/2014  . Pituitary microadenoma ([place]) 04/13/2014    Oretha Caprice, MPT 01/12/2020, 12:50 PM  [facility] [address] [place], [place], [address] Phone: [phone]   Fax:  [phone]  Name: TENIQUA MARRON MRN: [id number] Date of Birth: [birth year]

## 2020-01-14 ENCOUNTER — Encounter: Payer: Medicare Other | Admitting: Physical Therapy

## 2020-01-17 ENCOUNTER — Ambulatory Visit (INDEPENDENT_AMBULATORY_CARE_PROVIDER_SITE_OTHER): Payer: Medicare Other | Admitting: Physical Therapy

## 2020-01-17 ENCOUNTER — Other Ambulatory Visit: Payer: Self-pay

## 2020-01-17 DIAGNOSIS — M25571 Pain in right ankle and joints of right foot: Secondary | ICD-10-CM | POA: Diagnosis not present

## 2020-01-17 DIAGNOSIS — M545 Low back pain: Secondary | ICD-10-CM | POA: Diagnosis not present

## 2020-01-17 DIAGNOSIS — R262 Difficulty in walking, not elsewhere classified: Secondary | ICD-10-CM | POA: Diagnosis not present

## 2020-01-17 DIAGNOSIS — M6281 Muscle weakness (generalized): Secondary | ICD-10-CM

## 2020-01-17 DIAGNOSIS — M25671 Stiffness of right ankle, not elsewhere classified: Secondary | ICD-10-CM

## 2020-01-17 DIAGNOSIS — R2689 Other abnormalities of gait and mobility: Secondary | ICD-10-CM

## 2020-01-17 DIAGNOSIS — G8929 Other chronic pain: Secondary | ICD-10-CM

## 2020-01-17 NOTE — Therapy (Signed)
[facility] [place], [place], [address] Phone: [phone]   Fax:  [phone]  Physical Therapy Treatment  Patient Details  Name: Kristin Gilmore MRN: [id number] Date of Birth: Feb 10, 1951 Referring Provider (PT): Dr. Junius Roads   Encounter Date: 01/17/2020  PT End of Session - 01/17/20 1327    Visit Number  5    Number of Visits  12    Date for PT Re-Evaluation  03/10/20    Authorization Type  Medicare    Authorization Time Period  new cert sent on 0000000 for 8 additional visits.    PT Start Time  1310    PT Stop Time  1400    PT Time Calculation (min)  50 min    Activity Tolerance  Patient tolerated treatment well    Behavior During Therapy  WFL for tasks assessed/performed       Past Medical History:  Diagnosis Date  . Arthritis   . Depression   . Dyslipidemia   . Hypertension   . Migraine   . Migraine without aura, without mention of intractable migraine without mention of status migrainosus 04/13/2014  . Pituitary microadenoma ([place]) 04/13/2014  . Pre-diabetes    "i was told i was pre-diabetic a while ago"  . Tremor 04/13/2014    Past Surgical History:  Procedure Laterality Date  . CHOLECYSTECTOMY    . COLONOSCOPY W/ POLYPECTOMY    . LUMBAR LAMINECTOMY/DECOMPRESSION MICRODISCECTOMY N/A 12/19/2017   Procedure: LAMINECTOMY LUMBAR TWO- LUMBAR THREE, LUMBAR THREE- LUMBAR FOUR, LUMBAR FOUR- LUMBAR FIVE ;  Surgeon: Consuella Lose, MD;  Location: [facility];  Service: Neurosurgery;  Laterality: N/A;  . NASAL SINUS SURGERY    . TONSILLECTOMY    . WISDOM TOOTH EXTRACTION      There were no vitals filed for this visit.  Subjective Assessment - 01/17/20 1324    Subjective  Pt arriving to [facility]rpay today in wheel chair, reporting that today is a bad day and she didn't feel she could walk up to the clinic. Pt also still reporting waking up at night with pain in her low back.    Limitations  Standing;Walking    Diagnostic tests   xray.  MRI to be performed tomorrow.    Patient Stated Goals  Reduce pain    Pain Score  6     Pain Location  Back    Pain Orientation  Lower;Left    Pain Descriptors / Indicators  Aching;Sore    Pain Onset  More than a month ago                       [facility] Adult PT Treatment/Exercise - 01/17/20 0001      Exercises   Exercises  Lumbar      Lumbar Exercises: Stretches   Passive Hamstring Stretch  Right;Left;2 reps;20 seconds    Passive Hamstring Stretch Limitations  seated    Single Knee to Chest Stretch  3 reps    Lower Trunk Rotation  5 reps    Standing Extension  3 reps    Standing Extension Limitations  10 seconds, seated    Piriformis Stretch  Right;Left;2 reps;20 seconds      Lumbar Exercises: Standing   Other Standing Lumbar Exercises  sit to stand x 10 reps using UE support,       Lumbar Exercises: Seated   Long Arc Quad on Chair  Strengthening;Both;10 reps      Modalities   Modalities  Moist Heat      Moist Heat Therapy   Number Minutes Moist Heat  8 Minutes    Moist Heat Location  Lumbar Spine   L ship     Manual Therapy   Manual Therapy  Soft tissue mobilization    Manual therapy comments  8 minutes in sidelying    Soft tissue mobilization  STM to piriformis, lateral L hip and lumbar, sacral border on L                  PT Long Term Goals - 01/17/20 1341      PT LONG TERM GOAL #1   Title  Patient will demonstrate/report pain at worst less than or equal to 2/10 to facilitate minimal limitation in daily activity secondary to pain symptoms.    Status  On-going      PT LONG TERM GOAL #2   Title  Patient will demonstrate independent use of home exercise program to facilitate ability to maintain/progress functional gains from skilled physical therapy services.    Status  On-going      PT LONG TERM GOAL #3   Title  Patient will demonstrate LRAD ambulation community distances > 300 ft to facilitate community integration at [facility].     Status  On-going      PT LONG TERM GOAL #4   Title  Patient will demonstrate lumbar extension 75 or greater % WFL s symptoms to facilitate upright standing, walking posture at PLOF s limitation.    Status  On-going      PT LONG TERM GOAL #5   Title  Pt. will demonstrate ability to return to usual water aerobics s restriction to/from [facility].    Status  On-going      PT LONG TERM GOAL #6   Title  Pt. will demonstrate bilateral knee MMT 5/5 throughout to facilitate transfers, standing, walking at PLOF.    Status  On-going            Plan - 01/17/20 1329    Clinical Impression Statement  Pt arriving to therapy today reporting 5/10 pain in her low back with radiation down left LE. Pt using wheelchair from parking lot to clinic area. Pt reported she did not take her propranolol. Resting tremors noted. Pt tolerating exericses well with rest breaks as needed. Continue to progress LE strengthening, core strenthening, overall functional mobiltiy.    Personal Factors and Comorbidities  Age;Past/Current Experience;Comorbidity 1;Other    Comorbidities  Previous lumbar surgery    Examination-Activity Limitations  Bed Mobility;Bend;Stand;Squat;Transfers;Other    Examination-Participation Restrictions  Cleaning;Meal Prep;Community Activity    Stability/Clinical Decision Making  Stable/Uncomplicated    Rehab Potential  Fair    PT Frequency  2x / week    PT Duration  6 weeks    PT Treatment/Interventions  ADLs/Self Care Home Management;Cryotherapy;Electrical Stimulation;Ultrasound;Traction;Moist Heat;Gait training;Stair training;Functional mobility training;Therapeutic activities;Therapeutic exercise;Balance training;Neuromuscular re-education;Manual techniques;Patient/family education;Passive range of motion;Dry needling;Joint Manipulations;Spinal Manipulations;Taping    PT Next Visit Plan  HEP review, lumbar mobility intervention    PT Home Exercise Plan  [id number]    Consulted and Agree with Plan of  Care  Patient       Patient will benefit from skilled therapeutic intervention in order to improve the following deficits and impairments:  Decreased mobility, Decreased strength, Postural dysfunction, Hypomobility, Decreased balance, Pain, Decreased activity tolerance, Increased muscle spasms, Difficulty walking, Decreased range of motion, Abnormal gait  Visit Diagnosis: Chronic low back pain, unspecified back pain laterality, unspecified  whether sciatica present  Muscle weakness (generalized)  Difficulty in walking, not elsewhere classified  Pain in right ankle and joints of right foot  Stiffness of right ankle, not elsewhere classified  Other abnormalities of gait and mobility     Problem List Patient Active Problem List   Diagnosis Date Noted  . Lumbar spinal stenosis 12/19/2017  . Pain in joint, ankle and foot 05/02/2016  . Chronic venous insufficiency 05/02/2016  . Peripheral edema 11/01/2015  . Intractable chronic migraine without aura 11/02/2014  . Tremor 04/13/2014  . Migraine without aura 04/13/2014  . Pituitary microadenoma ([place]) 04/13/2014    Edmonia Lynch Chanceler Pullin,MPT 01/17/2020, 1:56 PM,   [facility] Physical Therapy [address] [place], [place], [address] Phone: [phone]   Fax:  [phone]  Name: Kristin Gilmore MRN: [id number] Date of Birth: Jun 07, 1951

## 2020-01-18 ENCOUNTER — Ambulatory Visit (INDEPENDENT_AMBULATORY_CARE_PROVIDER_SITE_OTHER): Payer: Medicare Other | Admitting: Orthopaedic Surgery

## 2020-01-18 ENCOUNTER — Encounter: Payer: Self-pay | Admitting: Orthopaedic Surgery

## 2020-01-18 VITALS — Ht 67.5 in | Wt 190.0 lb

## 2020-01-18 DIAGNOSIS — M48061 Spinal stenosis, lumbar region without neurogenic claudication: Secondary | ICD-10-CM | POA: Diagnosis not present

## 2020-01-18 DIAGNOSIS — M7062 Trochanteric bursitis, left hip: Secondary | ICD-10-CM

## 2020-01-18 MED ORDER — BUPIVACAINE HCL 0.5 % IJ SOLN
2.0000 mL | INTRAMUSCULAR | Status: AC | PRN
  Administered 2020-01-18: 2 mL via INTRA_ARTICULAR

## 2020-01-18 MED ORDER — METHYLPREDNISOLONE ACETATE 40 MG/ML IJ SUSP
40.0000 mg | INTRAMUSCULAR | Status: AC | PRN
  Administered 2020-01-18: 40 mg via INTRA_ARTICULAR

## 2020-01-18 MED ORDER — LIDOCAINE HCL 1 % IJ SOLN
0.5000 mL | INTRAMUSCULAR | Status: AC | PRN
  Administered 2020-01-18: .5 mL

## 2020-01-18 NOTE — Progress Notes (Signed)
Office Visit Note   Patient: Kristin Gilmore           Date of Birth: 11-15-1950           MRN: [id number] Visit Date: 01/18/2020              Requested by: Aletha Halim., PA-C [address],  [place] [address] PCP: Aletha Halim., PA-C   Assessment & Plan: Visit Diagnoses:  1. Spinal stenosis of lumbar region without neurogenic claudication   2. Trochanteric bursitis, left hip     Plan: Left trochanteric injection performed.  We will call her with the results of the EMGs once they are available.  Follow-Up Instructions: No follow-ups on file.   Orders:  Orders Placed This Encounter  Procedures  . Large Joint Inj   No orders of the defined types were placed in this encounter.     Procedures: Large Joint Inj: R greater trochanter on 01/18/2020 3:46 PM Details: lateral approach Medications: 0.5 mL lidocaine 1 %; 40 mg methylPREDNISolone acetate 40 MG/ML; 2 mL bupivacaine 0.5 %      Clinical Data: No additional findings.   Subjective: Chief Complaint  Patient presents with  . Left Leg - Pain    HPI 69 year old female returns states the injections in her back is not giving her any relief.  Previous decompression surgery by Dr. 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  She has some air in the disc space [id number].  She states she has much worse pain in her back buttocks into her lateral thigh.  She has been Dealer with a cane states she has been hurting for months and that time pain so severe she crawls to the bathroom.  Continues to have problems with Parkinson's is on medication has characteristic tremor noted in her upper extremities today bilaterally. Patient has EMG scheduled shortly for evaluation of back and left leg.  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  Scan shows no evidence of residual leak.  Patient has trouble standing she uses a wheelchair at times uses a cane for short ambulation.  Review of Systems 14  point system update unchanged from 12/17/2019 office visit.   Objective: Vital Signs: Ht 5' 7.5" (1.715 m)   Wt 190 lb (86.2 kg)   BMI 29.32 kg/m   Physical Exam Constitutional:      Appearance: She is well-developed.  HENT:     Head: Normocephalic.     Right Ear: External ear normal.     Left Ear: External ear normal.  Eyes:     Pupils: Pupils are equal, round, and reactive to light.  Neck:     Thyroid: No thyromegaly.     Trachea: No tracheal deviation.  Cardiovascular:     Rate and Rhythm: Normal rate.  Pulmonary:     Effort: Pulmonary effort is normal.  Abdominal:     Palpations: Abdomen is soft.  Skin:    General: Skin is warm and dry.  Neurological:     Mental Status: She is alert and oriented to person, place, and time.     Comments: Parkinson tremor bilateral UE  Psychiatric:        Behavior: Behavior normal.     Ortho Exam tenderness left sciatic notch mild tenderness of the trochanter  Negative logroll hips knees reach full extension.  Anterior tib gastrocsoleus is active. on the left negative on the right.  No tenderness lumbar spine.  Specialty Comments:  No specialty comments available.  Imaging: No results found.   PMFS History: Patient Active Problem List   Diagnosis Date Noted  . Trochanteric bursitis, left hip 01/18/2020  . Lumbar spinal stenosis 12/19/2017  . Pain in joint, ankle and foot 05/02/2016  . Chronic venous insufficiency 05/02/2016  . Peripheral edema 11/01/2015  . Intractable chronic migraine without aura 11/02/2014  . Tremor 04/13/2014  . Migraine without aura 04/13/2014  . Pituitary microadenoma ([place]) 04/13/2014   Past Medical History:  Diagnosis Date  . Arthritis   . Depression   . Dyslipidemia   . Hypertension   . Migraine   . Migraine without aura, without mention of intractable migraine without mention of status migrainosus 04/13/2014  . Pituitary microadenoma ([place]) 04/13/2014  . Pre-diabetes    "i was told i was  pre-diabetic a while ago"  . Tremor 04/13/2014    Family History  Problem Relation Age of Onset  . Cancer Father        stomach cancer  . Migraines Mother     Past Surgical History:  Procedure Laterality Date  . CHOLECYSTECTOMY    . COLONOSCOPY W/ POLYPECTOMY    . LUMBAR LAMINECTOMY/DECOMPRESSION MICRODISCECTOMY N/A 12/19/2017   Procedure: LAMINECTOMY LUMBAR TWO- LUMBAR THREE, LUMBAR THREE- LUMBAR FOUR, LUMBAR FOUR- LUMBAR FIVE ;  Surgeon: Consuella Lose, MD;  Location: [facility];  Service: Neurosurgery;  Laterality: N/A;  . NASAL SINUS SURGERY    . TONSILLECTOMY    . WISDOM TOOTH EXTRACTION     Social History   Occupational History  . Occupation: Retired  Tobacco Use  . Smoking status: Never Smoker  . Smokeless tobacco: Never Used  Substance and Sexual Activity  . Alcohol use: Yes    Alcohol/week: 0.0 standard drinks    Comment: occas.  . Drug use: No  . Sexual activity: Not on file

## 2020-01-19 ENCOUNTER — Telehealth: Payer: Self-pay

## 2020-01-19 ENCOUNTER — Encounter: Payer: Medicare Other | Admitting: Physical Therapy

## 2020-01-19 NOTE — Telephone Encounter (Signed)
Her choice.

## 2020-01-19 NOTE — Telephone Encounter (Signed)
Please advise. OK for PT?

## 2020-01-19 NOTE — Telephone Encounter (Signed)
Patient called Triage phone. States she had a cortisone injection yesterday. Dr. Lorin Mercy told her  to rest and ice it down. She would like to know if she should go to PT today or not. Would like a CB.  ([phone]

## 2020-01-19 NOTE — Telephone Encounter (Signed)
FYI  I spoke with patient. She states that she is feeling terrible. The injection helped some, but not completely. She has already called PT and cancelled for today. They thought that would be best. She is going to continue to rest and ice today.

## 2020-01-19 NOTE — Telephone Encounter (Signed)
Thank you :)

## 2020-01-24 ENCOUNTER — Ambulatory Visit (INDEPENDENT_AMBULATORY_CARE_PROVIDER_SITE_OTHER): Payer: Medicare Other | Admitting: Neurology

## 2020-01-24 ENCOUNTER — Other Ambulatory Visit: Payer: Self-pay

## 2020-01-24 ENCOUNTER — Encounter: Payer: Self-pay | Admitting: Neurology

## 2020-01-24 DIAGNOSIS — Z0289 Encounter for other administrative examinations: Secondary | ICD-10-CM

## 2020-01-24 DIAGNOSIS — M48061 Spinal stenosis, lumbar region without neurogenic claudication: Secondary | ICD-10-CM

## 2020-01-24 DIAGNOSIS — M5416 Radiculopathy, lumbar region: Secondary | ICD-10-CM

## 2020-01-24 NOTE — Progress Notes (Signed)
[place]    Nerve / Sites Muscle Latency Ref. Amplitude Ref. Rel Amp Segments Distance Velocity Ref. Area    ms ms mV mV %  cm m/s m/s mVms  L Peroneal - EDB     Ankle EDB 4.8 ?6.5 9.0 ?2.0 100 Ankle - EDB 9   24.6     Fib head EDB 11.8  7.0  78.7 Fib head - Ankle 29 41 ?44 24.8     Pop fossa EDB 14.3  6.6  94.2 Pop fossa - Fib head 10 41 ?44 21.0         Pop fossa - Ankle      R Peroneal - EDB     Ankle EDB 4.7 ?6.5 4.3 ?2.0 100 Ankle - EDB 9   13.3     Fib head EDB 11.5  3.0  70.3 Fib head - Ankle 28 41 ?44 14.0     Pop fossa EDB 13.9  3.3  109 Pop fossa - Fib head 10 42 ?44 13.1         Pop fossa - Ankle      L Tibial - AH     Ankle AH 5.2 ?5.8 5.3 ?4.0 100 Ankle - AH 9   10.8     Pop fossa AH 15.2  2.8  53.3 Pop fossa - Ankle 41 41 ?41 10.5  R Tibial - AH     Ankle AH 4.8 ?5.8 7.6 ?4.0 100 Ankle - AH 9   12.0     Pop fossa AH 16.7  2.7  35.6 Pop fossa - Ankle 41 35 ?41 8.5             SNC    Nerve / Sites Rec. Site Peak Lat Ref.  Amp Ref. Segments Distance    ms ms V V  cm  L Sural - Ankle (Calf)     Calf Ankle 4.3 ?4.4 7 ?6 Calf - Ankle 14  R Sural - Ankle (Calf)     Calf Ankle 4.1 ?4.4 7 ?6 Calf - Ankle 14  L Superficial peroneal - Ankle     Lat leg Ankle 4.4 ?4.4 2 ?6 Lat leg - Ankle 14  R Superficial peroneal - Ankle     Lat leg Ankle 4.9 ?4.4 4 ?6 Lat leg - Ankle 14             F  Wave    Nerve F Lat Ref.   ms ms  L Tibial - AH 50.2 ?56.0  R Tibial - AH 62.6 ?56.0

## 2020-01-24 NOTE — Procedures (Signed)
     HISTORY:  Kristin Gilmore is a 69 year old patient with a history of onset of back pain and pain down the left leg primarily.  The patient has evidence of a large disc herniation at the L4-5 level.  The patient is being evaluated for a possible lumbar radiculopathy.  NERVE CONDUCTION STUDIES:  Nerve conduction studies were performed on both lower extremities.  The distal motor latencies motor amplitudes for the peroneal and posterior tibial nerves were normal bilaterally with slowing seen for the peroneal nerves bilaterally and for the right posterior tibial nerve.  The nerve conduction velocity for the left posterior tibial nerve was borderline normal.  The sensory latencies for the sural nerves bilaterally and for the left peroneal nerve were normal, slightly prolonged for the right peroneal nerve.  The F wave latencies for the posterior tibial nerves were prolonged on the right and normal on the left.  EMG STUDIES: EMG study was performed on the left lower extremity:  The tibialis anterior muscle reveals 2 to 4K motor units with slightly reduced recruitment. 2+ positive waves were seen. The peroneus tertius muscle reveals 2 to 4K motor units with full recruitment. No fibrillations or positive waves were seen. The medial gastrocnemius muscle reveals 1 to 3K motor units with full recruitment. No fibrillations or positive waves were seen. The vastus lateralis muscle reveals 2 to 4K motor units with decreased recruitment. 2+ positive waves were seen. The iliopsoas muscle reveals 2 to 5K motor units with decreased recruitment. 2+ positive waves were seen. The biceps femoris muscle (long head) reveals 2 to 4K motor units with slightly decreased recruitment. 2+ positive waves were seen. The lumbosacral paraspinal muscles were tested at 3 levels, and revealed no abnormalities of insertional activity at the lower 2 levels levels tested.  Minimal increased insertional activity was seen in the  upper level.  There was good relaxation.   IMPRESSION:  Nerve conduction studies done on both lower extremities shows some motor slowing and mild sensory abnormalities that could be consistent with a mild peripheral neuropathy.  EMG evaluation of the left lower extremity shows findings consistent with an acute L4 and L5 radiculopathy, with some possible involvement L3 level as well.  Jill Alexanders MD 01/24/2020 4:08 PM  [facility] [address] [place], [place] [address]  Phone [phone] Fax [phone]

## 2020-01-24 NOTE — Progress Notes (Signed)
Please refer to EMG and nerve conduction procedure note.  

## 2020-01-26 ENCOUNTER — Encounter: Payer: Self-pay | Admitting: Physical Therapy

## 2020-01-26 ENCOUNTER — Ambulatory Visit (INDEPENDENT_AMBULATORY_CARE_PROVIDER_SITE_OTHER): Payer: Medicare Other | Admitting: Physical Therapy

## 2020-01-26 ENCOUNTER — Other Ambulatory Visit: Payer: Self-pay

## 2020-01-26 DIAGNOSIS — M6281 Muscle weakness (generalized): Secondary | ICD-10-CM | POA: Diagnosis not present

## 2020-01-26 DIAGNOSIS — R262 Difficulty in walking, not elsewhere classified: Secondary | ICD-10-CM | POA: Diagnosis not present

## 2020-01-26 DIAGNOSIS — M545 Low back pain, unspecified: Secondary | ICD-10-CM

## 2020-01-26 DIAGNOSIS — G8929 Other chronic pain: Secondary | ICD-10-CM | POA: Diagnosis not present

## 2020-01-26 NOTE — Therapy (Addendum)
[place], [place], [address] Phone: [phone]   Fax:  [phone]  Physical Therapy Treatment/Discharge Summary  Patient Details  Name: Kristin Gilmore MRN: [id number] Date of Birth: 1951-07-06 Referring Provider (PT): Dr. Junius Roads   Encounter Date: 01/26/2020  PT End of Session - 01/26/20 1503    Visit Number  6    Number of Visits  12    Date for PT Re-Evaluation  03/10/20    Authorization Type  Medicare    Authorization Time Period  new cert sent on 4/19 for 8 additional visits.    PT Start Time  1315    PT Stop Time  1358    PT Time Calculation (min)  43 min    Activity Tolerance  Patient tolerated treatment well    Behavior During Therapy  WFL for tasks assessed/performed       Past Medical History:  Diagnosis Date  . Arthritis   . Depression   . Dyslipidemia   . Hypertension   . Migraine   . Migraine without aura, without mention of intractable migraine without mention of status migrainosus 04/13/2014  . Pituitary microadenoma ([place]) 04/13/2014  . Pre-diabetes    "i was told i was pre-diabetic a while ago"  . Tremor 04/13/2014    Past Surgical History:  Procedure Laterality Date  . CHOLECYSTECTOMY    . COLONOSCOPY W/ POLYPECTOMY    . LUMBAR LAMINECTOMY/DECOMPRESSION MICRODISCECTOMY N/A 12/19/2017   Procedure: LAMINECTOMY LUMBAR TWO- LUMBAR THREE, LUMBAR THREE- LUMBAR FOUR, LUMBAR FOUR- LUMBAR FIVE ;  Surgeon: Consuella Lose, MD;  Location: [facility];  Service: Neurosurgery;  Laterality: N/A;  . NASAL SINUS SURGERY    . TONSILLECTOMY    . WISDOM TOOTH EXTRACTION      There were no vitals filed for this visit.  Subjective Assessment - 01/26/20 1311    Subjective  had NCV/EMG study last week - L4/5 radiculopathy with muscle damage and nerve damage to LLE.  has called for massage therapist but nothing scheduled yet.    Limitations  Standing;Walking    Diagnostic tests  xray.  MRI to be performed  tomorrow.    Patient Stated Goals  Reduce pain    Currently in Pain?  Yes    Pain Score  8     Pain Location  Back    Pain Orientation  Left;Lower    Pain Descriptors / Indicators  Aching;Sore    Pain Type  Chronic pain    Pain Onset  More than a month ago    Pain Frequency  Constant    Aggravating Factors   getting up after sleeping, prolonged standing, prolonged walking    Pain Relieving Factors  medication                       [facility] Adult PT Treatment/Exercise - 01/26/20 1320      Lumbar Exercises: Stretches   Passive Hamstring Stretch  Right;Left;2 reps;20 seconds    Passive Hamstring Stretch Limitations  seated    Single Knee to Chest Stretch  Right;Left;3 reps;30 seconds      Lumbar Exercises: Aerobic   Nustep  L5 x 6 min      Lumbar Exercises: Supine   Clam  10 reps   red band; single limb bil   Other Supine Lumbar Exercises  isometric hip adduction 20 x 5 sec      Moist Heat Therapy   Number Minutes Moist Heat  --  declined today     Manual Therapy   Manual Therapy  Soft tissue mobilization    Manual therapy comments  8 minutes in prone    Soft tissue mobilization  STM to piriformis, lateral L hip and lumbar, sacral border on L                  PT Long Term Goals - 01/17/20 1341      PT LONG TERM GOAL #1   Title  Patient will demonstrate/report pain at worst less than or equal to 2/10 to facilitate minimal limitation in daily activity secondary to pain symptoms.    Status  On-going      PT LONG TERM GOAL #2   Title  Patient will demonstrate independent use of home exercise program to facilitate ability to maintain/progress functional gains from skilled physical therapy services.    Status  On-going      PT LONG TERM GOAL #3   Title  Patient will demonstrate LRAD ambulation community distances > 300 ft to facilitate community integration at [facility].    Status  On-going      PT LONG TERM GOAL #4   Title  Patient will demonstrate  lumbar extension 75 or greater % WFL s symptoms to facilitate upright standing, walking posture at PLOF s limitation.    Status  On-going      PT LONG TERM GOAL #5   Title  Pt. will demonstrate ability to return to usual water aerobics s restriction to/from [facility].    Status  On-going      PT LONG TERM GOAL #6   Title  Pt. will demonstrate bilateral knee MMT 5/5 throughout to facilitate transfers, standing, walking at PLOF.    Status  On-going            Plan - 01/26/20 1504    Clinical Impression Statement  Pt continues to report elevated pain and states NCV showed herniated disc at L4/5, and awaiting Dr. Narda Amber recommendations.  Pt with decreased pain following session and continued to stress importance of consistent attendance and exercise.  Will continue to benefit from PT to maximize function.    Personal Factors and Comorbidities  Age;Past/Current Experience;Comorbidity 1;Other    Comorbidities  Previous lumbar surgery    Examination-Activity Limitations  Bed Mobility;Bend;Stand;Squat;Transfers;Other    Examination-Participation Restrictions  Cleaning;Meal Prep;Community Activity    Stability/Clinical Decision Making  Stable/Uncomplicated    Rehab Potential  Fair    PT Frequency  2x / week    PT Duration  6 weeks    PT Treatment/Interventions  ADLs/Self Care Home Management;Cryotherapy;Electrical Stimulation;Ultrasound;Traction;Moist Heat;Gait training;Stair training;Functional mobility training;Therapeutic activities;Therapeutic exercise;Balance training;Neuromuscular re-education;Manual techniques;Patient/family education;Passive range of motion;Dry needling;Joint Manipulations;Spinal Manipulations;Taping    PT Next Visit Plan  lumbar mobility intervention; needs general strength/conditioning if she will tolerate and agree to exercise    PT Home Exercise Plan  [id number]    Consulted and Agree with Plan of Care  Patient       Patient will benefit from skilled therapeutic  intervention in order to improve the following deficits and impairments:  Decreased mobility, Decreased strength, Postural dysfunction, Hypomobility, Decreased balance, Pain, Decreased activity tolerance, Increased muscle spasms, Difficulty walking, Decreased range of motion, Abnormal gait  Visit Diagnosis: Chronic low back pain, unspecified back pain laterality, unspecified whether sciatica present  Muscle weakness (generalized)  Difficulty in walking, not elsewhere classified     Problem List Patient Active Problem List   Diagnosis Date Noted  .  Trochanteric bursitis, left hip 01/18/2020  . Lumbar spinal stenosis 12/19/2017  . Pain in joint, ankle and foot 05/02/2016  . Chronic venous insufficiency 05/02/2016  . Peripheral edema 11/01/2015  . Intractable chronic migraine without aura 11/02/2014  . Tremor 04/13/2014  . Migraine without aura 04/13/2014  . Pituitary microadenoma ([place]) 04/13/2014      Laureen Abrahams, PT, DPT 01/26/20 3:06 PM     [facility] [address] [place], [place], [address] Phone: [phone]   Fax:  [phone]  Name: Loray Akard Husak MRN: [id number] Date of Birth: 15-Apr-1951     PHYSICAL THERAPY DISCHARGE SUMMARY  Visits from Start of Care: 6  Current functional level related to goals / functional outcomes: See above   Remaining deficits: unknown   Education / Equipment: HEP  Plan: Patient agrees to discharge.  Patient goals were not met. Patient is being discharged due to not returning since the last visit.  ?????     Laureen Abrahams, PT, DPT 05/09/20 8:51 AM  [facility] [address] [place], [place], [address] Phone: [phone]   Fax:  [phone]

## 2020-01-27 ENCOUNTER — Telehealth: Payer: Self-pay | Admitting: Radiology

## 2020-01-27 ENCOUNTER — Telehealth: Payer: Self-pay

## 2020-01-27 NOTE — Telephone Encounter (Signed)
Please see below and advise.

## 2020-01-27 NOTE — Telephone Encounter (Signed)
Patient left VM on Triage Phone. States she had a NCS & a Muscle Study. Would like a CB from Dr. Lorin Mercy with Results.   CB [phone]

## 2020-01-27 NOTE — Telephone Encounter (Signed)
Dr Lorin Mercy would like for patient to see Dr. Louanne Skye. He has discussed this with him. Patient will need lumbar fusion.

## 2020-02-01 ENCOUNTER — Other Ambulatory Visit: Payer: Self-pay | Admitting: Family Medicine

## 2020-02-01 ENCOUNTER — Telehealth: Payer: Self-pay | Admitting: Specialist

## 2020-02-01 NOTE — Telephone Encounter (Signed)
I called and advised that currently we do not have anything sooner than 03/03/20, but that I have put her on the cancellation list, and I will call her as soon as something opens [place] sooner.

## 2020-02-01 NOTE — Telephone Encounter (Signed)
Moved appt to 02/02/20

## 2020-02-01 NOTE — Telephone Encounter (Signed)
Pt called stating she didn't want to wait until 03/03/20 and would like a call back.   [phone]

## 2020-02-01 NOTE — Telephone Encounter (Signed)
I called and lmom that we could see her on 03/03/20 2 10:30, I advised that if this did not work to call us back and we could r/s the appt.

## 2020-02-02 ENCOUNTER — Other Ambulatory Visit: Payer: Self-pay

## 2020-02-02 ENCOUNTER — Encounter: Payer: Self-pay | Admitting: Specialist

## 2020-02-02 ENCOUNTER — Ambulatory Visit (INDEPENDENT_AMBULATORY_CARE_PROVIDER_SITE_OTHER): Payer: Medicare Other | Admitting: Specialist

## 2020-02-02 ENCOUNTER — Encounter: Payer: Medicare Other | Admitting: Physical Therapy

## 2020-02-02 VITALS — BP 162/81 | HR 69 | Ht 67.5 in | Wt 190.0 lb

## 2020-02-02 DIAGNOSIS — M48061 Spinal stenosis, lumbar region without neurogenic claudication: Secondary | ICD-10-CM

## 2020-02-02 DIAGNOSIS — M48062 Spinal stenosis, lumbar region with neurogenic claudication: Secondary | ICD-10-CM

## 2020-02-02 DIAGNOSIS — M4316 Spondylolisthesis, lumbar region: Secondary | ICD-10-CM | POA: Diagnosis not present

## 2020-02-02 DIAGNOSIS — M5136 Other intervertebral disc degeneration, lumbar region: Secondary | ICD-10-CM

## 2020-02-02 DIAGNOSIS — M5416 Radiculopathy, lumbar region: Secondary | ICD-10-CM

## 2020-02-02 NOTE — Progress Notes (Signed)
Office Visit Note   Patient: Kristin Gilmore           Date of Birth: 10/12/1950           MRN: [id number] Visit Date: 02/02/2020              Requested by: Aletha Halim., PA-C [address]  [place] [address] PCP: Aletha Halim., PA-C   Assessment & Plan: Visit Diagnoses:  1. Spinal stenosis of lumbar region without neurogenic claudication   2. DDD (degenerative disc disease), lumbar   3. Spondylolisthesis, lumbar region   4. Radiculopathy, lumbar region     Plan: Avoid bending, stooping and avoid lifting weights greater than 10 lbs. Avoid prolong standing and walking. Order for a new walker with wheels. Surgery scheduling secretary Kandice Hams, will call you in the next week to schedule for surgery.  Surgery recommended is a three level lumbar fusion L3-4, L4-5 and L5-S1 this would be done with rods, screws and cages with local bone graft and allograft (donor bone graft). Take hydrocodone for for pain. Risk of surgery includes risk of infection 1 in 300 patients, bleeding 1/2% chance you would need a transfusion.   Risk to the nerves is one in 10,000. You will need to use a brace for 3 months and wean from the brace on the 4th month.  Follow-Up Instructions: Return in about 4 weeks (around 03/01/2020).   Orders:  No orders of the defined types were placed in this encounter.  No orders of the defined types were placed in this encounter.     Procedures: No procedures performed   Clinical Data: No additional findings.   Subjective: Chief Complaint  Patient presents with  . Lower Back - Pain    Per Dr. Lorin Mercy eval/discuss spinal fusion with patient    69 year old female with history of lumbar laminectomies by Dr. Nundkumar 2 years ago. She was having no problem with left leg pain or difficulty walking. She reports having the surgery mainly due to the pain that was positional. History of back problems for years. Never had difficulty with  left leg pain that radiated into the left thigh and left anterior leg. The right leg is also starting to hurt into the right knee. Left leg feels numb and painful at night. Early in the morning the pain is worse then improves with being up and then again with returning to go to bed. She is able to walk stairs. Pain is worse with standing and walking and she is stooping and leaning on items when walking around the house. She is having to use a walker to get around the house. She has used walker for walker last Spring to get around while in [place]. She has fallen in the past last time Oct-Nov in the family room and has fallen off the stool then the stool moved. The left leg is weak and the whole left leg is weak. Increasing weakness as she walks. Dr. Jannifer Franklin did EMG/NCV and these showed left leg With nerve changes. She has seen Dr. Jannifer Franklin and he takes care of her for migraines. Takes 2 ES tylenol of HAs and another med. No bowel or bladder symptoms. Surgery with Dr. Kathyrn Sheriff did helped for awhile until this recent left leg symptoms began. She has been to PT the last several months with out help and she has had bilateral hip injections by Dr. Nelva Bush awhile ago last summer. Also has had injection  by Dr. Ernestina Patches and it did not help.     Review of Systems  Constitutional: Negative.  Negative for activity change, appetite change, chills, diaphoresis, fatigue, fever and unexpected weight change.  HENT: Positive for dental problem (Has a tooth that lost a filling). Negative for congestion, drooling, ear discharge, ear pain, facial swelling, hearing loss, mouth sores, nosebleeds, postnasal drip, rhinorrhea, sinus pressure, sinus pain, sneezing, sore throat, tinnitus, trouble swallowing and voice change.   Eyes: Negative.  Negative for photophobia, pain, discharge, redness, itching and visual disturbance.  Respiratory: Negative.  Negative for apnea, cough, choking, chest tightness, shortness of breath,  wheezing and stridor.   Cardiovascular: Negative for chest pain and palpitations.  Gastrointestinal: Positive for abdominal distention and abdominal pain. Negative for anal bleeding, blood in stool, constipation, diarrhea, nausea, rectal pain and vomiting.  Endocrine: Negative.  Negative for cold intolerance, heat intolerance, polydipsia, polyphagia and polyuria.  Genitourinary: Negative.  Negative for difficulty urinating, dyspareunia, dysuria, enuresis, flank pain and hematuria.  Musculoskeletal: Positive for arthralgias and back pain. Negative for gait problem, joint swelling, myalgias, neck pain and neck stiffness.  Allergic/Immunologic: Negative.  Negative for environmental allergies, food allergies and immunocompromised state.  Neurological: Positive for weakness, numbness and headaches. Negative for dizziness, tremors, seizures, syncope, facial asymmetry, speech difficulty and light-headedness.  Hematological: Negative.  Negative for adenopathy. Does not bruise/bleed easily.  Psychiatric/Behavioral: Negative.  Negative for agitation, behavioral problems, confusion, decreased concentration, dysphoric mood, hallucinations, self-injury, sleep disturbance and suicidal ideas. The patient is not nervous/anxious and is not hyperactive.      Objective: Vital Signs: BP (!) 162/81 (BP Location: Left Arm, Patient Position: Sitting)   Pulse 69   Ht 5' 7.5" (1.715 m)   Wt 190 lb (86.2 kg)   BMI 29.32 kg/m   Physical Exam Constitutional:      Appearance: She is well-developed.  HENT:     Head: Normocephalic and atraumatic.  Eyes:     Pupils: Pupils are equal, round, and reactive to light.  Pulmonary:     Effort: Pulmonary effort is normal.     Breath sounds: Normal breath sounds.  Abdominal:     General: Bowel sounds are normal.     Palpations: Abdomen is soft.  Musculoskeletal:        General: Normal range of motion.     Cervical back: Normal range of motion and neck supple.  Skin:     General: Skin is warm and dry.  Neurological:     Mental Status: She is alert and oriented to person, place, and time.  Psychiatric:        Behavior: Behavior normal.        Thought Content: Thought content normal.        Judgment: Judgment normal.     Ortho Exam  Specialty Comments:  No specialty comments available.  Imaging: No results found.   PMFS History: Patient Active Problem List   Diagnosis Date Noted  . Trochanteric bursitis, left hip 01/18/2020  . Lumbar spinal stenosis 12/19/2017  . Pain in joint, ankle and foot 05/02/2016  . Chronic venous insufficiency 05/02/2016  . Peripheral edema 11/01/2015  . Intractable chronic migraine without aura 11/02/2014  . Tremor 04/13/2014  . Migraine without aura 04/13/2014  . Pituitary microadenoma ([place]) 04/13/2014   Past Medical History:  Diagnosis Date  . Arthritis   . Depression   . Dyslipidemia   . Hypertension   . Migraine   . Migraine without aura, without mention of  intractable migraine without mention of status migrainosus 04/13/2014  . Pituitary microadenoma ([place]) 04/13/2014  . Pre-diabetes    "i was told i was pre-diabetic a while ago"  . Tremor 04/13/2014    Family History  Problem Relation Age of Onset  . Cancer Father        stomach cancer  . Migraines Mother     Past Surgical History:  Procedure Laterality Date  . CHOLECYSTECTOMY    . COLONOSCOPY W/ POLYPECTOMY    . LUMBAR LAMINECTOMY/DECOMPRESSION MICRODISCECTOMY N/A 12/19/2017   Procedure: LAMINECTOMY LUMBAR TWO- LUMBAR THREE, LUMBAR THREE- LUMBAR FOUR, LUMBAR FOUR- LUMBAR FIVE ;  Surgeon: Consuella Lose, MD;  Location: [facility];  Service: Neurosurgery;  Laterality: N/A;  . NASAL SINUS SURGERY    . TONSILLECTOMY    . WISDOM TOOTH EXTRACTION     Social History   Occupational History  . Occupation: Retired  Tobacco Use  . Smoking status: Never Smoker  . Smokeless tobacco: Never Used  Substance and Sexual Activity  . Alcohol use: Yes     Alcohol/week: 0.0 standard drinks    Comment: occas.  . Drug use: No  . Sexual activity: Not on file

## 2020-02-02 NOTE — Patient Instructions (Signed)
Avoid bending, stooping and avoid lifting weights greater than 10 lbs. Avoid prolong standing and walking. Order for a new walker with wheels. Surgery scheduling secretary Kandice Hams, will call you in the next week to schedule for surgery.  Surgery recommended is a three level lumbar fusion L3-4, L4-5 and L5-S1 this would be done with rods, screws and cages with local bone graft and allograft (donor bone graft). Take hydrocodone for for pain. Risk of surgery includes risk of infection 1 in 300 patients, bleeding 1/2% chance you would need a transfusion.   Risk to the nerves is one in 10,000. You will need to use a brace for 3 months and wean from the brace on the 4th month. Expect improved walking and standing tolerance. Expect relief of leg pain but numbness may persist depending on the length and degree of pressure that has been present.

## 2020-02-07 ENCOUNTER — Telehealth: Payer: Self-pay

## 2020-02-07 ENCOUNTER — Other Ambulatory Visit: Payer: Self-pay | Admitting: Neurology

## 2020-02-07 ENCOUNTER — Encounter: Payer: Medicare Other | Admitting: Physical Therapy

## 2020-02-07 NOTE — Telephone Encounter (Signed)
   [facility] Pre-operative Risk Assessment    Request for surgical clearance:  1. What type of surgery is being performed? Lumbar Fusion    2. When is this surgery scheduled?  TBD   3. What type of clearance is required (medical clearance vs. Pharmacy clearance to hold med vs. Both)? Medical   4. Are there any medications that need to be held prior to surgery and how long? None listed    5. Practice name and name of physician performing surgery? [facility]    6. What is your office phone number [phone]    7.   What is your office fax number [phone] Attn: Sherrie   8.   Anesthesia type (None, local, MAC, general) ? General    Kristin Gilmore 02/07/2020, 5:25 PM  _________________________________________________________________   (provider comments below)

## 2020-02-08 ENCOUNTER — Telehealth: Payer: Self-pay | Admitting: Neurology

## 2020-02-08 MED ORDER — CHLORPROMAZINE HCL 25 MG PO TABS: ORAL_TABLET | ORAL | 1 refills | Status: DC

## 2020-02-08 NOTE — Telephone Encounter (Signed)
I have sent a message to our Scheduling team to please help in setting up a New Pt appt with [facility] per pre op team.

## 2020-02-08 NOTE — Telephone Encounter (Signed)
   Primary [place], MD  Chart reviewed as part of pre-operative protocol coverage. Because of Raiana G Frankum's past medical history and time since last visit, he/she will require a follow-up visit in order to better assess preoperative cardiovascular risk.  Last OV was 03/2017 with Dr. Curt Bears. Do not see any active EP issues so please schedule with general APP.  Pre-op covering staff: - Please schedule appointment and call patient to inform them. - Please contact requesting surgeon's office via preferred method (i.e, phone, fax) to inform them of need for appointment prior to surgery. -Not currently on any blood thinners to hold but patient needs to be seen to clarify  Charlie Pitter, PA-C  02/08/2020, 9:25 AM

## 2020-02-08 NOTE — Telephone Encounter (Signed)
I called the patient.  Patient wants a larger prescription for the Thorazine, she knows that it is to be taken only as a rescue drug for severe headache, it is not to be taken daily.  I will write the prescription.

## 2020-02-08 NOTE — Telephone Encounter (Signed)
Pt called stating that she is needing a refill on her chlorproMAZINE (THORAZINE) 25 MG tablet but she is wanting to know if she can get more than 6 at a time because it is to expensive getting it just 6 at a time. Please advise.

## 2020-02-09 ENCOUNTER — Telehealth: Payer: Self-pay | Admitting: Specialist

## 2020-02-09 ENCOUNTER — Encounter: Payer: Medicare Other | Admitting: Physical Therapy

## 2020-02-09 NOTE — Telephone Encounter (Signed)
Patient called requesting a call back from Dr. Otho Ket nurse. Patient did not disclosed nature of question she has for nurse. Patient phone number is [phone].

## 2020-02-09 NOTE — Telephone Encounter (Signed)
Patient stated that she got a call from a Cardiologist, and states that she has never had any heart problems and wasn't sure why she had to see a cardiologist, I advised that it is because she needs a cardiac clearance per Dr. Louanne Skye before he can do surgery on her.  She states that she did not know this and she will call them back and make an appt.

## 2020-02-10 NOTE — Telephone Encounter (Signed)
Pt has appt to see Oda Kilts, [facility] 02/16/20 for pre op clearance. I will forward clearance notes to [facility] for upcoming appt. I will send FYI to the requesting office pt needed New Pt appt. I will remove from the pre op call back pool.

## 2020-02-14 ENCOUNTER — Encounter: Payer: Medicare Other | Admitting: Physical Therapy

## 2020-02-16 ENCOUNTER — Ambulatory Visit (INDEPENDENT_AMBULATORY_CARE_PROVIDER_SITE_OTHER): Payer: Medicare Other | Admitting: Student

## 2020-02-16 ENCOUNTER — Other Ambulatory Visit: Payer: Self-pay

## 2020-02-16 ENCOUNTER — Encounter (INDEPENDENT_AMBULATORY_CARE_PROVIDER_SITE_OTHER): Payer: Self-pay

## 2020-02-16 ENCOUNTER — Encounter: Payer: Medicare Other | Admitting: Physical Therapy

## 2020-02-16 ENCOUNTER — Encounter: Payer: Self-pay | Admitting: Student

## 2020-02-16 VITALS — BP 120/64 | HR 61 | Ht 67.5 in | Wt 211.2 lb

## 2020-02-16 DIAGNOSIS — I159 Secondary hypertension, unspecified: Secondary | ICD-10-CM

## 2020-02-16 DIAGNOSIS — R0789 Other chest pain: Secondary | ICD-10-CM | POA: Diagnosis not present

## 2020-02-16 NOTE — Patient Instructions (Addendum)
Medication Instructions:  none *If you need a refill on your cardiac medications before your next appointment, please call your pharmacy*   Lab Work: none If you have labs (blood work) drawn today and your tests are completely normal, you will receive your results only by: Marland Kitchen MyChart Message (if you have MyChart) OR . A paper copy in the mail If you have any lab test that is abnormal or we need to change your treatment, we will call you to review the results.   Testing/Procedures: none   Follow-Up: At [facility], you and your health needs are our priority.  As part of our continuing mission to provide you with exceptional heart care, we have created designated Provider Care Teams.  These Care Teams include your primary Cardiologist (physician) and Advanced Practice Providers (APPs -  Physician Assistants and Nurse Practitioners) who all work together to provide you with the care you need, when you need it.  We recommend signing up for the patient portal called "MyChart".  Sign up information is provided on this After Visit Summary.  MyChart is used to connect with patients for Virtual Visits (Telemedicine).  Patients are able to view lab/test results, encounter notes, upcoming appointments, etc.  Non-urgent messages can be sent to your provider as well.   To learn more about what you can do with MyChart, go to [email or web address].    Your next appointment:   As Needed Other Instructions

## 2020-02-16 NOTE — Progress Notes (Signed)
PCP:  Aletha Halim., PA-C Primary Cardiologist: Pending Electrophysiologist: Will Meredith Leeds, MD   [place] is a 69 y.o. female seen today for Will Meredith Leeds, MD for cardiac clearance.  Since last being seen in our clinic the patient reports doing very well. She denies any cardiac symptoms since last visit. She denies chest pain, palpitations, dyspnea, PND, orthopnea, nausea, vomiting, dizziness, syncope, edema, weight gain, or early satiety.  Past Medical History:  Diagnosis Date  . Arthritis   . Depression   . Dyslipidemia   . Hypertension   . Migraine   . Migraine without aura, without mention of intractable migraine without mention of status migrainosus 04/13/2014  . Pituitary microadenoma ([place]) 04/13/2014  . Pre-diabetes    "i was told i was pre-diabetic a while ago"  . Tremor 04/13/2014   Past Surgical History:  Procedure Laterality Date  . CHOLECYSTECTOMY    . COLONOSCOPY W/ POLYPECTOMY    . LUMBAR LAMINECTOMY/DECOMPRESSION MICRODISCECTOMY N/A 12/19/2017   Procedure: LAMINECTOMY LUMBAR TWO- LUMBAR THREE, LUMBAR THREE- LUMBAR FOUR, LUMBAR FOUR- LUMBAR FIVE ;  Surgeon: Consuella Lose, MD;  Location: [facility];  Service: Neurosurgery;  Laterality: N/A;  . NASAL SINUS SURGERY    . TONSILLECTOMY    . WISDOM TOOTH EXTRACTION      Current Outpatient Medications  Medication Sig Dispense Refill  . acetaminophen (TYLENOL) 500 MG tablet Take 1,000 mg by mouth 2 (two) times daily.     Marland Kitchen amitriptyline (ELAVIL) 25 MG tablet 2 TABLETS AT NIGHT FOR 1 WEEK, THEN TAKE 3 TABLETS 270 tablet 2  . amLODipine (NORVASC) 5 MG tablet amlodipine 5 mg tablet  TAKE 1 TABLET BY MOUTH TWICE A DAY    . Calcium Citrate (CAL-CITRATE PO) Take 1-2 tablets by mouth 2 (two) times daily. TAKE 1 TABLET IN THE MORNING & TAKE 2 TABLETS WITH SUPPER    . carvedilol (COREG) 25 MG tablet Take a half pill twice per day    . chlorproMAZINE (THORAZINE) 25 MG tablet 1 tablet twice daily for 3  days if needed for headache 18 tablet 1  . Cholecalciferol (VITAMIN D3) 2000 UNITS TABS Take 2,000 Units by mouth daily with supper.     . Coenzyme Q10 (COQ10 PO) Take 100 mg by mouth daily with supper. H-Eq10 (CoQ10 with Hemp)    . Coenzyme Q10 (COQ10) 100 MG CAPS Take 100 mg by mouth daily with supper.    . diazepam (VALIUM) 5 MG tablet 1 PO 1 hour before MRI, repeat prn 5 tablet 0  . Erenumab-aooe (AIMOVIG) 140 MG/ML SOAJ Inject 140 mg into the skin every 30 (thirty) days. 1.12 mg 4  . ferrous sulfate 325 (65 FE) MG EC tablet Take 1 tablet by mouth 2 (two) times daily.    . fluticasone (FLONASE) 50 MCG/ACT nasal spray Place 1 spray into both nostrils daily.     Marland Kitchen gabapentin (NEURONTIN) 300 MG capsule Take by mouth.    Marland Kitchen glimepiride (AMARYL) 2 MG tablet Take 2 mg by mouth every morning.    . Glucosamine-Chondroitin (COSAMIN DS PO) Take 3 capsules by mouth daily. 1500 glucosamine    . hydrochlorothiazide (HYDRODIURIL) 25 MG tablet Take 25 mg by mouth daily.    Marland Kitchen loperamide (IMODIUM) 2 MG capsule Take 2-4 mg by mouth 2 (two) times daily as needed for diarrhea or loose stools.    Marland Kitchen loratadine (CLARITIN) 10 MG tablet Take 10 mg by mouth daily.    Marland Kitchen MAGNESIUM SULFATE PO  Take 1 tablet by mouth at bedtime.     . nabumetone (RELAFEN) 750 MG tablet Take 1 tablet (750 mg total) by mouth 2 (two) times daily as needed. 60 tablet 6  . nystatin cream (MYCOSTATIN) APPLY TO AREA TWICE A DAY    . Omega 3 1000 MG CAPS Take 1,000 mg by mouth 2 (two) times daily. SUPER OMEGA-3    . OVER THE COUNTER MEDICATION Take 2 capsules by mouth daily with supper. Quick 6 Weight Loss Supplement    . pantoprazole (PROTONIX) 40 MG tablet pantoprazole 40 mg tablet,delayed release    . Probiotic Product (PROBIOTIC PO) Take 1 capsule by mouth daily. SUPREMA DOPHILUS 5 BILLION CFU    . propranolol (INDERAL) 10 MG tablet TAKE 1 TABLET (10 MG TOTAL) BY MOUTH 2 (TWO) TIMES DAILY AS NEEDED (FOR HAND TREMORS). 180 tablet 0  .  QUEtiapine (SEROQUEL) 300 MG tablet Take 0.5 tablets (150 mg total) by mouth at bedtime. (Patient taking differently: Take by mouth at bedtime. Take 1/4 tab) 45 tablet 3  . Red Yeast Rice 600 MG CAPS Take 1,200 mg by mouth 2 (two) times daily.    Marland Kitchen ROYAL JELLY PO Take 150 mg by mouth daily.    Marland Kitchen telmisartan (MICARDIS) 80 MG tablet telmisartan 80 mg tablet    . tiZANidine (ZANAFLEX) 2 MG tablet TAKE 1-2 TABLETS (2-4 MG TOTAL) BY MOUTH EVERY 6 (SIX) HOURS AS NEEDED FOR MUSCLE SPASMS. 60 tablet 1  . TURMERIC PO Take 475 mg by mouth 2 (two) times daily. WITH FOOD    . vitamin E 400 UNIT capsule Take 400 Units by mouth daily with supper.      No current facility-administered medications for this visit.    Allergies  Allergen Reactions  . Amoxicillin Hives    UNSPECIFIED REACTION  Has patient had a PCN reaction causing immediate rash, facial/tongue/throat swelling, SOB or lightheadedness with hypotension: Unknown Has patient had a PCN reaction causing severe rash involving mucus membranes or skin necrosis: Unknown Has patient had a PCN reaction that required hospitalization: Unknown Has patient had a PCN reaction occurring within the last 10 years: No If all of the above answers are "NO", then may proceed with Cephalosporin use.   . Clarithromycin Hives, Other (See Comments) and Rash  . Atropine Hives    Social History   Socioeconomic History  . Marital status: Married    Spouse name: Not on file  . Number of children: 1  . Years of education: college  . Highest education level: Not on file  Occupational History  . Occupation: Retired  Tobacco Use  . Smoking status: Never Smoker  . Smokeless tobacco: Never Used  Substance and Sexual Activity  . Alcohol use: Yes    Alcohol/week: 0.0 standard drinks    Comment: occas.  . Drug use: No  . Sexual activity: Not on file  Other Topics Concern  . Not on file  Social History Narrative   Lives at home, married   Patient does not drink  caffeine.   Patient is right handed.   Social Determinants of Health   Financial Resource Strain:   . Difficulty of Paying Living Expenses:   Food Insecurity:   . Worried About Charity fundraiser in the Last Year:   . Arboriculturist in the Last Year:   Transportation Needs:   . Film/video editor (Medical):   Marland Kitchen Lack of Transportation (Non-Medical):   Physical Activity:   .  Days of Exercise per Week:   . Minutes of Exercise per Session:   Stress:   . Feeling of Stress :   Social Connections:   . Frequency of Communication with Friends and Family:   . Frequency of Social Gatherings with Friends and Family:   . Attends Religious Services:   . Active Member of Clubs or Organizations:   . Attends Archivist Meetings:   Marland Kitchen Marital Status:   Intimate Partner Violence:   . Fear of Current or Ex-Partner:   . Emotionally Abused:   Marland Kitchen Physically Abused:   . Sexually Abused:      Review of Systems: General: No chills, fever, night sweats or weight changes  Cardiovascular:  No chest pain, dyspnea on exertion, edema, orthopnea, palpitations, paroxysmal nocturnal dyspnea Dermatological: No rash, lesions or masses Respiratory: No cough, dyspnea Urologic: No hematuria, dysuria Abdominal: No nausea, vomiting, diarrhea, bright red blood per rectum, melena, or hematemesis Neurologic: No visual changes, weakness, changes in mental status All other systems reviewed and are otherwise negative except as noted above.  Physical Exam: There were no vitals filed for this visit.  GEN- The patient is well appearing, alert and oriented x 3 today.   HEENT: normocephalic, atraumatic; sclera clear, conjunctiva pink; hearing intact; oropharynx clear; neck supple, no JVP Lymph- no cervical lymphadenopathy Lungs- Clear to ausculation bilaterally, normal work of breathing.  No wheezes, rales, rhonchi Heart- Regular rate and rhythm, no murmurs, rubs or gallops, PMI not laterally  displaced GI- soft, non-tender, non-distended, bowel sounds present, no hepatosplenomegaly Extremities- no clubbing, cyanosis, or edema; DP/PT/radial pulses 2+ bilaterally MS- no significant deformity or atrophy Skin- warm and dry, no rash or lesion Psych- euthymic mood, full affect Neuro- strength and sensation are intact  EKG is ordered. Personal review of EKG from today shows NSR 61 bpm, normal intervals.  Additional studies reviewed include: Previous EP office notes, previous Echo and myoview.  Assessment and Plan:  1. HTN Continue current regiment  2. H/o chest pain She has no further chest pain since previous visit. Thought to be non-cardiac.  Normal/low risk myoview 03/2017 Normal echo 11/2015  3. Cardiac clearance for Lumbar Fusion Pt is cleared to proceed with lumbar fusion with a "low risk" of 0.9% of perioperative cardiac complications based on the Revised Cardiac Risk Index [place] Criteria) Pt has no on going cardiac issues. Pt is "pre-diabetic"  She is stable. She has no issues from an EP standpoint, and is well managed by her PCP for HTN, HLD, and DMII. EP to see as needed. Pt would prefer not to see a general cardiologist at this time.  She will discuss further with her PCP.   Shirley Friar, PA-C  02/16/20 1:00 PM

## 2020-02-24 ENCOUNTER — Other Ambulatory Visit: Payer: Self-pay | Admitting: Family Medicine

## 2020-03-02 ENCOUNTER — Other Ambulatory Visit: Payer: Self-pay | Admitting: Neurology

## 2020-03-03 ENCOUNTER — Ambulatory Visit: Payer: Medicare Other | Admitting: Specialist

## 2020-03-17 ENCOUNTER — Other Ambulatory Visit: Payer: Self-pay | Admitting: Radiology

## 2020-03-17 ENCOUNTER — Telehealth: Payer: Self-pay | Admitting: Specialist

## 2020-03-17 ENCOUNTER — Other Ambulatory Visit: Payer: Self-pay

## 2020-03-17 DIAGNOSIS — M48062 Spinal stenosis, lumbar region with neurogenic claudication: Secondary | ICD-10-CM

## 2020-03-17 DIAGNOSIS — M4316 Spondylolisthesis, lumbar region: Secondary | ICD-10-CM

## 2020-03-17 DIAGNOSIS — M5416 Radiculopathy, lumbar region: Secondary | ICD-10-CM

## 2020-03-17 DIAGNOSIS — M5136 Other intervertebral disc degeneration, lumbar region: Secondary | ICD-10-CM

## 2020-03-17 NOTE — Telephone Encounter (Signed)
I called and advised that this has been entered.

## 2020-03-17 NOTE — Telephone Encounter (Signed)
Patient asked if Dr. Louanne Skye would refer her to a neuro surgeon for a second opinion. Patient asked if she can be called back after 11:00am The number to contact patient is [phone]

## 2020-03-22 ENCOUNTER — Telehealth: Payer: Self-pay

## 2020-03-22 NOTE — Telephone Encounter (Signed)
Patient called again wanting to let Dr. Louanne Skye know that the office/practice that he referred her to for a 2nd opinion will not see her due to being discharged from that practice.  (Office referred to was Dr. Vertell Limber or Dr. Ellene Route)  Talked with Alyse Low and advised her of message above.  Stated to advise patient that she will let Dr. Louanne Skye know on Thursday, 03/23/2020 and will give her a call back once he decides on another practice for 2nd opinion.  Advised patient of message per [facility] and she voiced that she understands.

## 2020-03-22 NOTE — Telephone Encounter (Signed)
Patient has been discharged from [facility].--where else can we send her.

## 2020-03-22 NOTE — Telephone Encounter (Signed)
Patient would like a call back ASAP.  CB# [phone].  Please advise.  Thank you.

## 2020-03-23 ENCOUNTER — Other Ambulatory Visit: Payer: Self-pay | Admitting: Radiology

## 2020-03-23 DIAGNOSIS — M5136 Other intervertebral disc degeneration, lumbar region: Secondary | ICD-10-CM

## 2020-03-23 DIAGNOSIS — M48062 Spinal stenosis, lumbar region with neurogenic claudication: Secondary | ICD-10-CM

## 2020-03-23 DIAGNOSIS — M4316 Spondylolisthesis, lumbar region: Secondary | ICD-10-CM

## 2020-03-23 DIAGNOSIS — M5416 Radiculopathy, lumbar region: Secondary | ICD-10-CM

## 2020-03-23 NOTE — Telephone Encounter (Signed)
Referral made to [facility]

## 2020-03-23 NOTE — Telephone Encounter (Signed)
I called and lmom that we can send her to [facility] or [facility], but she needs to call the office back and let us know which she would like [place] to send her to.

## 2020-03-24 ENCOUNTER — Telehealth: Payer: Self-pay | Admitting: Neurology

## 2020-03-24 MED ORDER — SUMATRIPTAN SUCCINATE 50 MG PO TABS: ORAL_TABLET | ORAL | 1 refills | Status: DC

## 2020-03-24 NOTE — Telephone Encounter (Signed)
She called that headaches have been much worse the last week.  In the past, Thorazine would help but it is not helping her anymore.  I will send in a prescription for Imitrex 50 mg #10 with 1 refill to take 1 as needed and may repeat in 2 hours.  No more than 2 a day or for a week.    Chart was reviewed.  She has hypertension but no significant cardiac history and has recently been cardiac cleared for surgery.

## 2020-03-27 ENCOUNTER — Telehealth: Payer: Self-pay | Admitting: Radiology

## 2020-03-27 ENCOUNTER — Telehealth: Payer: Self-pay | Admitting: Neurology

## 2020-03-27 MED ORDER — CHLORPROMAZINE HCL 50 MG PO TABS
ORAL_TABLET | ORAL | 1 refills | Status: DC
Start: 2020-03-27 — End: 2020-10-10

## 2020-03-27 MED ORDER — AMITRIPTYLINE HCL 10 MG PO TABS
20.0000 mg | ORAL_TABLET | Freq: Every day | ORAL | 1 refills | Status: DC
Start: 2020-03-27 — End: 2020-08-30

## 2020-03-27 NOTE — Telephone Encounter (Signed)
Spoke with pt and discussed message from Dr. Leonie Man. The pt is refusing to go to the ER. She will not call 911. She does not have a ride from anyone either. She accepted the only current available appt with Amy NP this Wed 6/30 @ 9:30 AM. Will look out for something sooner or in the afternoon as preferred by pt, but she did accept this one. She verbalized appreciation for the call.

## 2020-03-27 NOTE — Telephone Encounter (Signed)
I called patient back, I advised her appt for this week is for a pre-op appt with Jeneen Rinks, I advised that I will cancel this appt and her surgery, due to her not having her 2nd Opinion yet, she was very short with me on the phone. ---can you please cancel her surgery on 04/05/20

## 2020-03-27 NOTE — Telephone Encounter (Signed)
It seems like patient has had new onset chronic daily headaches which are refractory to the medication she is taking.  I agree with referral to the emergency room for brain scan as well as trying further injectables.  If she is unwilling she needs to be seen in the work-in slot next available in the office with NP or MD

## 2020-03-27 NOTE — Telephone Encounter (Signed)
This patient does not have new onset daily headaches.  This is a typical pattern that she has, she will do well with her headaches at times and other times she has headaches every day.  She has had headaches off and on for years, in the past she has been on Botox injections.  I would agree that she does not need to go to the emergency room.  I called and talk with the patient.  The patient will take 20 mg of amitriptyline at night and go on Thorazine 50 mg twice daily for 3 days, she was only on the 25 mg tablets.

## 2020-03-27 NOTE — Telephone Encounter (Signed)
Spoke with patient. She said she is getting horrific headaches every day for 2 weeks. Every day gets around headache around 10 AM-11 AM. By noon its so bad she has to take something. "Same old darn headache I've had off and on for years". She goes back to bed. She has pain in her face, forehead, also back of neck today. Thorazine doesn't help. The Imitrex helped but she has already used 4 tablets in 2 days and only has 6 tablets left. She takes Amitriptyline 10 mg at night. Takes Tylenol 650 CR every 6 hours for months for her back. She also takes Gabapentin and Nabumetone and Baclofen about every 6 hours for her back. She also says she has worsened tremors now affected L hand. She said Dr. Jannifer Franklin has seen the tremor before. She said she has had it with these headaches. I recommended the pt go to the ER if her headaches are that bad. She declined. She will wait for a call back from on-call doctor.

## 2020-03-27 NOTE — Addendum Note (Signed)
Addended by: Kathrynn Ducking on: 03/27/2020 06:10 PM   Modules accepted: Orders

## 2020-03-27 NOTE — Telephone Encounter (Signed)
Patient called again stating that no one has called her back and she has had a bad headache every day for a couple of weeks and the medicine that Dr. Jannifer Franklin gave her is not helping her.

## 2020-03-27 NOTE — Telephone Encounter (Signed)
Patient called stating she is having an awful headache and she is having one every day. And would like a call back today.

## 2020-03-27 NOTE — Telephone Encounter (Signed)
Patient requests return call. She states that someone called her to remind her of an appointment with our office and she knows nothing about that. She was also supposed to be referred for second opinion to somewhere in [facility], but she has not been contacted in regards to that. She is tentatively scheduled for surgery with Dr. Louanne Skye on 04/04/2020 and does not know if this will have to be postponed due not having the second opinion in time. She requests call back from [place] today to discuss.  CB [phone]

## 2020-03-28 ENCOUNTER — Other Ambulatory Visit: Payer: Self-pay | Admitting: Neurology

## 2020-03-28 NOTE — Progress Notes (Deleted)
PATIENT: Kristin Gilmore DOB: March 01, 1951  REASON FOR VISIT: follow up HISTORY FROM: patient  No chief complaint on file.    HISTORY OF PRESENT ILLNESS: Today 03/28/20 [name] is a 69 y.o. female here today for follow up for migraines. She called with worsening headaches recently and amitriptyline was increased to 20mg  daily. Thorazine increased to 50mg  BID for 3 days. She is also taking gabapentin 300mg  QID and propranolol 10mg  BID.   HISTORY: (copied from Dr Jannifer Franklin' note on 07/05/2019)  [name] is a 69 year old right-handed white female with a history of headaches.  The patient was last seen in July 2019, she was placed on amitriptyline at that time, she indicates that her headaches disappeared completely and she went off the medication.  She has done quite well until around Labor Day of this year when for some reason the headaches started back again.  The patient notes that the headaches are bitemporal in nature and are daily.  The patient denies any neck pain or neck stiffness which may have pain into the ears.  She also reports that she gets up frequently at night to urinate, but she gets back to sleep easily.  She occasionally will have stomach pains and takes dicyclomine for this.  She returns to the office today for an evaluation.   REVIEW OF SYSTEMS: Out of a complete 14 system review of symptoms, the patient complains only of the following symptoms, and all other reviewed systems are negative.  ALLERGIES: Allergies  Allergen Reactions  . Amoxicillin Hives    UNSPECIFIED REACTION  Has patient had a PCN reaction causing immediate rash, facial/tongue/throat swelling, SOB or lightheadedness with hypotension: Unknown Has patient had a PCN reaction causing severe rash involving mucus membranes or skin necrosis: Unknown Has patient had a PCN reaction that required hospitalization: Unknown Has patient had a PCN reaction occurring within the last 10 years:  No If all of the above answers are "NO", then may proceed with Cephalosporin use.   . Clarithromycin Hives, Other (See Comments) and Rash  . Atropine Hives    HOME MEDICATIONS: Outpatient Medications Prior to Visit  Medication Sig Dispense Refill  . acetaminophen (TYLENOL) 500 MG tablet Take 1,000 mg by mouth 2 (two) times daily.     Marland Kitchen amitriptyline (ELAVIL) 10 MG tablet Take 2 tablets (20 mg total) by mouth at bedtime. 180 tablet 1  . amLODipine (NORVASC) 5 MG tablet amlodipine 5 mg tablet  TAKE 1 TABLET BY MOUTH TWICE A DAY    . baclofen (LIORESAL) 10 MG tablet Take 10 mg by mouth every 6 (six) hours as needed.    . Calcium Citrate (CAL-CITRATE PO) Take 1-2 tablets by mouth 2 (two) times daily. TAKE 1 TABLET IN THE MORNING & TAKE 2 TABLETS WITH SUPPER    . carvedilol (COREG) 25 MG tablet Take a half pill twice per day    . chlorproMAZINE (THORAZINE) 50 MG tablet 1 tablet twice daily for 6 doses 6 tablet 1  . Cholecalciferol (VITAMIN D3) 2000 UNITS TABS Take 2,000 Units by mouth daily with supper.     . Coenzyme Q10 (COQ10) 100 MG CAPS Take 100 mg by mouth daily with supper.    . ferrous sulfate 325 (65 FE) MG EC tablet Take 1 tablet by mouth 2 (two) times daily.    . fluticasone (FLONASE) 50 MCG/ACT nasal spray Place 1 spray into both nostrils daily.     Marland Kitchen gabapentin (NEURONTIN) 300 MG capsule Take  300 mg by mouth every 6 (six) hours as needed.     Marland Kitchen glimepiride (AMARYL) 2 MG tablet Take 2 mg by mouth every morning.    . Glucosamine-Chondroitin (COSAMIN DS PO) Take 3 capsules by mouth daily. 1500 glucosamine    . hydrochlorothiazide (HYDRODIURIL) 25 MG tablet Take 25 mg by mouth daily.    Marland Kitchen loperamide (IMODIUM) 2 MG capsule Take 2-4 mg by mouth 2 (two) times daily as needed for diarrhea or loose stools.    Marland Kitchen MAGNESIUM SULFATE PO Take 1 tablet by mouth at bedtime.     . nabumetone (RELAFEN) 750 MG tablet Take 1 tablet (750 mg total) by mouth 2 (two) times daily as needed. 60 tablet 6  .  nystatin cream (MYCOSTATIN) APPLY TO AREA TWICE A DAY    . Omega 3 1000 MG CAPS Take 1,000 mg by mouth 2 (two) times daily. SUPER OMEGA-3    . OVER THE COUNTER MEDICATION Take 2 capsules by mouth daily with supper. Quick 6 Weight Loss Supplement    . Probiotic Product (PROBIOTIC PO) Take 1 capsule by mouth daily. SUPREMA DOPHILUS 5 BILLION CFU    . propranolol (INDERAL) 10 MG tablet TAKE 1 TABLET (10 MG TOTAL) BY MOUTH 2 (TWO) TIMES DAILY AS NEEDED (FOR HAND TREMORS). 180 tablet 0  . QUEtiapine (SEROQUEL) 300 MG tablet Take 120 mg by mouth at bedtime. Take 1/4 tab at bedtime    . Red Yeast Rice 600 MG CAPS Take 1,200 mg by mouth 2 (two) times daily.    Marland Kitchen ROYAL JELLY PO Take 150 mg by mouth daily.    . SUMAtriptan (IMITREX) 50 MG tablet Take one as needed for headache.  May repeat in 2 hours if headache persists or recurs.  No more than 2/day or 4/week 10 tablet 1  . telmisartan (MICARDIS) 80 MG tablet telmisartan 80 mg tablet    . tiZANidine (ZANAFLEX) 2 MG tablet TAKE 1-2 TABLETS (2-4 MG TOTAL) BY MOUTH EVERY 6 (SIX) HOURS AS NEEDED FOR MUSCLE SPASMS. 60 tablet 1  . TURMERIC PO Take 475 mg by mouth 2 (two) times daily. WITH FOOD    . vitamin E 400 UNIT capsule Take 400 Units by mouth daily with supper.      No facility-administered medications prior to visit.    PAST MEDICAL HISTORY: Past Medical History:  Diagnosis Date  . Arthritis   . Depression   . Dyslipidemia   . Hypertension   . Migraine   . Migraine without aura, without mention of intractable migraine without mention of status migrainosus 04/13/2014  . Pituitary microadenoma ([place]) 04/13/2014  . Pre-diabetes    "i was told i was pre-diabetic a while ago"  . Tremor 04/13/2014    PAST SURGICAL HISTORY: Past Surgical History:  Procedure Laterality Date  . CHOLECYSTECTOMY    . COLONOSCOPY W/ POLYPECTOMY    . LUMBAR LAMINECTOMY/DECOMPRESSION MICRODISCECTOMY N/A 12/19/2017   Procedure: LAMINECTOMY LUMBAR TWO- LUMBAR THREE, LUMBAR  THREE- LUMBAR FOUR, LUMBAR FOUR- LUMBAR FIVE ;  Surgeon: Consuella Lose, MD;  Location: [facility];  Service: Neurosurgery;  Laterality: N/A;  . NASAL SINUS SURGERY    . TONSILLECTOMY    . WISDOM TOOTH EXTRACTION      FAMILY HISTORY: Family History  Problem Relation Age of Onset  . Cancer Father        stomach cancer  . Migraines Mother     SOCIAL HISTORY: Social History   Socioeconomic History  . Marital status: Married  Spouse name: Not on file  . Number of children: 1  . Years of education: college  . Highest education level: Not on file  Occupational History  . Occupation: Retired  Tobacco Use  . Smoking status: Never Smoker  . Smokeless tobacco: Never Used  Vaping Use  . Vaping Use: Never used  Substance and Sexual Activity  . Alcohol use: Yes    Alcohol/week: 0.0 standard drinks    Comment: occas.  . Drug use: No  . Sexual activity: Not on file  Other Topics Concern  . Not on file  Social History Narrative   Lives at home, married   Patient does not drink caffeine.   Patient is right handed.   Social Determinants of Health   Financial Resource Strain:   . Difficulty of Paying Living Expenses:   Food Insecurity:   . Worried About Charity fundraiser in the Last Year:   . Arboriculturist in the Last Year:   Transportation Needs:   . Film/video editor (Medical):   Marland Kitchen Lack of Transportation (Non-Medical):   Physical Activity:   . Days of Exercise per Week:   . Minutes of Exercise per Session:   Stress:   . Feeling of Stress :   Social Connections:   . Frequency of Communication with Friends and Family:   . Frequency of Social Gatherings with Friends and Family:   . Attends Religious Services:   . Active Member of Clubs or Organizations:   . Attends Archivist Meetings:   Marland Kitchen Marital Status:   Intimate Partner Violence:   . Fear of Current or Ex-Partner:   . Emotionally Abused:   Marland Kitchen Physically Abused:   . Sexually Abused:        PHYSICAL EXAM  There were no vitals filed for this visit. There is no height or weight on file to calculate BMI.  Generalized: Well developed, in no acute distress  Cardiology: normal rate and rhythm, no murmur noted Respiratory: clear to auscultation bilaterally  Neurological examination  Mentation: Alert oriented to time, place, history taking. Follows all commands speech and language fluent Cranial nerve II-XII: Pupils were equal round reactive to light. Extraocular movements were full, visual field were full on confrontational test. Facial sensation and strength were normal. Uvula tongue midline. Head turning and shoulder shrug  were normal and symmetric. Motor: The motor testing reveals 5 over 5 strength of all 4 extremities. Good symmetric motor tone is noted throughout.  Sensory: Sensory testing is intact to soft touch on all 4 extremities. No evidence of extinction is noted.  Coordination: Cerebellar testing reveals good finger-nose-finger and heel-to-shin bilaterally.  Gait and station: Gait is normal. Tandem gait is normal. Romberg is negative. No drift is seen.  Reflexes: Deep tendon reflexes are symmetric and normal bilaterally.   DIAGNOSTIC DATA (LABS, IMAGING, TESTING) - I reviewed patient records, labs, notes, testing and imaging myself where available.  No flowsheet data found.   Lab Results  Component Value Date   WBC 12.1 (H) 12/20/2017   HGB 13.0 12/20/2017   HCT 39.7 12/20/2017   MCV 93.2 12/20/2017   PLT 243 12/20/2017      Component Value Date/Time   NA 134 (L) 12/20/2017 0403   NA 142 11/01/2015 1232   K 4.6 12/20/2017 0403   CL 97 (L) 12/20/2017 0403   CO2 25 12/20/2017 0403   GLUCOSE 149 (H) 12/20/2017 0403   BUN 17 12/20/2017 0403   BUN  29 (H) 11/01/2015 1232   CREATININE 0.91 12/20/2017 0403   CALCIUM 8.6 (L) 12/20/2017 0403   PROT 6.5 11/01/2015 1232   ALBUMIN 4.5 11/01/2015 1232   AST 28 11/01/2015 1232   ALT 37 (H) 11/01/2015 1232    ALKPHOS 91 11/01/2015 1232   BILITOT 0.3 11/01/2015 1232   GFRNONAA >60 12/20/2017 0403   GFRAA >60 12/20/2017 0403   Lab Results  Component Value Date   CHOL  04/07/2008    166        ATP III CLASSIFICATION:  <200     mg/dL   Desirable  200-239  mg/dL   Borderline High  >=240    mg/dL   High   HDL 64 04/07/2008   LDLCALC  04/07/2008    85        Total Cholesterol/HDL:CHD Risk Coronary Heart Disease Risk Table                     Men   Women  1/2 Average Risk   3.4   3.3   TRIG 86 04/07/2008   CHOLHDL 2.6 04/07/2008   No results found for: HGBA1C Lab Results  Component Value Date   VITAMINB12 338 11/01/2015   Lab Results  Component Value Date   TSH 0.969 11/01/2015       ASSESSMENT AND PLAN 69 y.o. year old female  has a past medical history of Arthritis, Depression, Dyslipidemia, Hypertension, Migraine, Migraine without aura, without mention of intractable migraine without mention of status migrainosus (04/13/2014), Pituitary microadenoma ([place]) (04/13/2014), Pre-diabetes, and Tremor (04/13/2014). here with ***  No diagnosis found.     No orders of the defined types were placed in this encounter.    No orders of the defined types were placed in this encounter.     I spent 15 minutes with the patient. 50% of this time was spent counseling and educating patient on plan of care and medications.    Debbora Presto, FNP-C 03/28/2020, 4:29 PM [facility] [address] [place], [place] [address] [phone]

## 2020-03-29 ENCOUNTER — Ambulatory Visit: Payer: Self-pay | Admitting: Family Medicine

## 2020-03-30 ENCOUNTER — Ambulatory Visit: Payer: Medicare Other | Admitting: Surgery

## 2020-04-04 ENCOUNTER — Inpatient Hospital Stay: Admit: 2020-04-04 | Payer: Medicare Other | Admitting: Specialist

## 2020-04-04 SURGERY — POSTERIOR LUMBAR FUSION 3 LEVEL
Anesthesia: General

## 2020-04-05 ENCOUNTER — Telehealth: Payer: Self-pay | Admitting: *Deleted

## 2020-04-05 NOTE — Telephone Encounter (Signed)
Pt left vm stating she is checking status of 2nd opionion to Neurosurgery to [facility], I returned call left voice message stating this was sent to [facility] on 03/24/20 and they will be the ones to call to schedule. I gave pt number [facility] of  P: [phone]  Advised pt to call them to check on status.

## 2020-04-06 ENCOUNTER — Ambulatory Visit: Payer: Medicare Other | Admitting: Specialist

## 2020-04-06 ENCOUNTER — Telehealth: Payer: Self-pay | Admitting: Specialist

## 2020-04-06 NOTE — Telephone Encounter (Signed)
Worked on 04/10/20 @ 345 per [employer]

## 2020-04-06 NOTE — Telephone Encounter (Signed)
Patient called canceled her appointment due to feeling sick on her stomach. Patient said she had diarrhea and having stomach cramps. Patient asked if she can be worked back into Dr Otho Ket schedule next week? The number to contact patient is [phone]

## 2020-04-10 ENCOUNTER — Other Ambulatory Visit: Payer: Self-pay

## 2020-04-10 ENCOUNTER — Ambulatory Visit: Payer: Self-pay

## 2020-04-10 ENCOUNTER — Encounter: Payer: Self-pay | Admitting: Specialist

## 2020-04-10 ENCOUNTER — Ambulatory Visit (INDEPENDENT_AMBULATORY_CARE_PROVIDER_SITE_OTHER): Payer: Medicare Other | Admitting: Specialist

## 2020-04-10 VITALS — BP 147/95 | HR 90 | Ht 67.5 in | Wt 211.0 lb

## 2020-04-10 DIAGNOSIS — M25561 Pain in right knee: Secondary | ICD-10-CM

## 2020-04-10 DIAGNOSIS — M1711 Unilateral primary osteoarthritis, right knee: Secondary | ICD-10-CM | POA: Diagnosis not present

## 2020-04-10 MED ORDER — METHYLPREDNISOLONE ACETATE 40 MG/ML IJ SUSP
40.0000 mg | INTRAMUSCULAR | Status: AC | PRN
  Administered 2020-04-10: 40 mg via INTRA_ARTICULAR

## 2020-04-10 MED ORDER — BUPIVACAINE HCL 0.25 % IJ SOLN
4.0000 mL | INTRAMUSCULAR | Status: AC | PRN
  Administered 2020-04-10: 4 mL via INTRA_ARTICULAR

## 2020-04-10 NOTE — Progress Notes (Signed)
Office Visit Note   Patient: Kristin Gilmore           Date of Birth: March 22, 1951           MRN: [id number] Visit Date: 04/10/2020              Requested by: Aletha Halim., PA-C [address],  [place] [address] PCP: Aletha Halim., PA-C   Assessment & Plan: Visit Diagnoses:  1. Acute pain of right knee   2. Unilateral primary osteoarthritis, right knee     Plan:The main ways of treat osteoarthritis, that are found to be success. Weight loss helps to decrease pain. Exercise is important to maintaining cartilage and thickness and strengthening. NSAIDs like motrin, tylenol, alleve are meds decreasing the inflamation. Ice is okay  In afternoon and evening and hot shower in the am Try the extension exercises of the knee, straight leg raises and terminal quadriceps strengthening. On line recommendations for treatment of knee osteoarthritis;  AAOS Summary of recommendations for treatment of osteoarthritis of the knee pdf   Knee is suffering from osteoarthritis, only real proven treatments are Weight loss, NSIADs like diclofenac gel or relafen and exercise. Well padded shoes help. Ice the knee that is suffering from osteoarthritis, only real proven treatments are Weight loss and exercise and use of NSAIDS or tramadol. Well padded shoes help. Ice the knee 2-3 times a day 15-20 mins at a time.-3 times a day 15-20 mins at a time. Hot showers in the AM.  Injection with steroid may be of benefit. Hemp CBD capsules, amazon.com 5,000-7,000 mg per bottle, 60 capsules per bottle, take one capsule twice a day. Cane in the left hand to use with left leg weight bearing. Follow-Up Instructions: No follow-ups on file.  Orders:  Orders Placed This Encounter  Procedures  . XR Knee 1-2 Views Right   No orders of the defined types were placed in this encounter.     Procedures: Large Joint Inj: R knee on 04/10/2020 5:19 PM Indications: pain Details: 25 G 1.5 in needle,  anteromedial approach  Arthrogram: No  Medications: 40 mg methylPREDNISolone acetate 40 MG/ML; 4 mL bupivacaine 0.25 % Outcome: tolerated well, no immediate complications Procedure, treatment alternatives, risks and benefits explained, specific risks discussed. Consent was given by the patient. Immediately prior to procedure a time out was called to verify the correct patient, procedure, equipment, support staff and site/side marked as required. Patient was prepped and draped in the usual sterile fashion.       Clinical Data: No additional findings.   Subjective: Chief Complaint  Patient presents with  . Right Knee - Pain    HPI  Review of Systems   Objective: Vital Signs: BP (!) 147/95 (BP Location: Left Arm, Patient Position: Sitting)   Pulse 90   Ht 5' 7.5" (1.715 m)   Wt 211 lb (95.7 kg)   BMI 32.56 kg/m   Physical Exam  Ortho Exam  Specialty Comments:  No specialty comments available.  Imaging: No results found.   PMFS History: Patient Active Problem List   Diagnosis Date Noted  . Trochanteric bursitis, left hip 01/18/2020  . Lumbar spinal stenosis 12/19/2017  . Pain in joint, ankle and foot 05/02/2016  . Chronic venous insufficiency 05/02/2016  . Peripheral edema 11/01/2015  . Intractable chronic migraine without aura 11/02/2014  . Tremor 04/13/2014  . Migraine without aura 04/13/2014  . Pituitary microadenoma ([place]) 04/13/2014   Past Medical History:  Diagnosis  Date  . Arthritis   . Depression   . Dyslipidemia   . Hypertension   . Migraine   . Migraine without aura, without mention of intractable migraine without mention of status migrainosus 04/13/2014  . Pituitary microadenoma ([place]) 04/13/2014  . Pre-diabetes    "i was told i was pre-diabetic a while ago"  . Tremor 04/13/2014    Family History  Problem Relation Age of Onset  . Cancer Father        stomach cancer  . Migraines Mother     Past Surgical History:  Procedure Laterality  Date  . CHOLECYSTECTOMY    . COLONOSCOPY W/ POLYPECTOMY    . LUMBAR LAMINECTOMY/DECOMPRESSION MICRODISCECTOMY N/A 12/19/2017   Procedure: LAMINECTOMY LUMBAR TWO- LUMBAR THREE, LUMBAR THREE- LUMBAR FOUR, LUMBAR FOUR- LUMBAR FIVE ;  Surgeon: Consuella Lose, MD;  Location: [facility];  Service: Neurosurgery;  Laterality: N/A;  . NASAL SINUS SURGERY    . TONSILLECTOMY    . WISDOM TOOTH EXTRACTION     Social History   Occupational History  . Occupation: Retired  Tobacco Use  . Smoking status: Never Smoker  . Smokeless tobacco: Never Used  Vaping Use  . Vaping Use: Never used  Substance and Sexual Activity  . Alcohol use: Yes    Alcohol/week: 0.0 standard drinks    Comment: occas.  . Drug use: No  . Sexual activity: Not on file

## 2020-04-10 NOTE — Patient Instructions (Addendum)
Plan:The main ways of treat osteoarthritis, that are found to be success. Weight loss helps to decrease pain. Exercise is important to maintaining cartilage and thickness and strengthening. NSAIDs like motrin, tylenol, alleve are meds decreasing the inflamation. Ice is okay  In afternoon and evening and hot shower in the am Try the extension exercises of the knee, straight leg raises and terminal quadriceps strengthening. On line recommendations for treatment of knee osteoarthritis;  AAOS Summary of recommendations for treatment of osteoarthritis of the knee pdf   Knee is suffering from osteoarthritis, only real proven treatments are Weight loss, NSIADs like diclofenac gel or relafen and exercise. Well padded shoes help. Ice the knee that is suffering from osteoarthritis, only real proven treatments are Weight loss and exercise and use of NSAIDS or tramadol. Well padded shoes help. Ice the knee 2-3 times a day 15-20 mins at a time.-3 times a day 15-20 mins at a time. Hot showers in the AM.  Injection with steroid may be of benefit. Hemp CBD capsules, amazon.com 5,000-7,000 mg per bottle, 60 capsules per bottle, take one capsule twice a day. Cane in the left hand to use with left leg weight bearing. Follow-Up Instructions: No follow-ups on file.  Follow-Up Instructions: No follow-ups on file.

## 2020-04-19 ENCOUNTER — Telehealth: Payer: Self-pay | Admitting: Specialist

## 2020-04-19 NOTE — Telephone Encounter (Signed)
IC spoke with patient. She was wanting to know about her referral the [facility] neurosurgery. Provided patient the phone number to reach out to [facility]

## 2020-04-19 NOTE — Telephone Encounter (Signed)
Patient called. She would like [name] to call her.

## 2020-04-27 ENCOUNTER — Other Ambulatory Visit: Payer: Self-pay | Admitting: Family Medicine

## 2020-04-30 ENCOUNTER — Other Ambulatory Visit: Payer: Self-pay | Admitting: Psychiatry

## 2020-04-30 DIAGNOSIS — F3171 Bipolar disorder, in partial remission, most recent episode hypomanic: Secondary | ICD-10-CM

## 2020-05-03 NOTE — Telephone Encounter (Signed)
Not sure still seeing you? Last visit 03/2019

## 2020-05-04 NOTE — Telephone Encounter (Signed)
Staff will RS

## 2020-05-15 ENCOUNTER — Ambulatory Visit (INDEPENDENT_AMBULATORY_CARE_PROVIDER_SITE_OTHER): Payer: Medicare Other | Admitting: Specialist

## 2020-05-15 ENCOUNTER — Encounter: Payer: Self-pay | Admitting: Specialist

## 2020-05-15 ENCOUNTER — Other Ambulatory Visit: Payer: Self-pay

## 2020-05-15 VITALS — BP 136/80 | HR 72 | Ht 67.5 in | Wt 211.0 lb

## 2020-05-15 DIAGNOSIS — M48062 Spinal stenosis, lumbar region with neurogenic claudication: Secondary | ICD-10-CM | POA: Diagnosis not present

## 2020-05-15 DIAGNOSIS — M4156 Other secondary scoliosis, lumbar region: Secondary | ICD-10-CM

## 2020-05-15 DIAGNOSIS — M5136 Other intervertebral disc degeneration, lumbar region: Secondary | ICD-10-CM

## 2020-05-15 DIAGNOSIS — M4316 Spondylolisthesis, lumbar region: Secondary | ICD-10-CM

## 2020-05-15 NOTE — Progress Notes (Signed)
Office Visit Note   Patient: Kristin Gilmore           Date of Birth: Mar 21, 1951           MRN: [id number] Visit Date: 05/15/2020              Requested by: Aletha Halim., PA-C [address],  [place] [address] PCP: Aletha Halim., PA-C   Assessment & Plan: Visit Diagnoses:  1. Spondylolisthesis, lumbar region   2. Spinal stenosis of lumbar region with neurogenic claudication   3. DDD (degenerative disc disease), lumbar   4. Other secondary scoliosis, lumbar region     Plan: Avoid bending, stooping and avoid lifting weights greater than 10 lbs. Avoid prolong standing and walking. Avoid frequent bending and stooping  No lifting greater than 10 lbs. May use ice or moist heat for pain. Weight loss is of benefit. Handicap license is approved. Will refer you to [facility] for evaluation for consideration of  Long lumbar fusion L2 to S1 with XILFs and posterior instrumentation TLIF at L5-S1?  Follow-Up Instructions: Return in about 4 weeks (around 06/12/2020).   Orders:  Orders Placed This Encounter  Procedures  . Ambulatory referral to Orthopedic Surgery   No orders of the defined types were placed in this encounter.     Procedures: No procedures performed   Clinical Data: No additional findings.   Subjective: Chief Complaint  Patient presents with  . Lower Back - Follow-up    69 year old female with history of collapsing degenerative scoliosis with DDD L2-3, L3-4,  L4-5 and L5-S1 with right sided curvature. She has had lumbar laminectomy  But is till limited by mechanical back pain and pain that is back greater than leg. She is unable to stand in one place or walk. She has pain that prevents her from be able to Function at a level that she is considering fusion. She saw Dr. Rigoberto Noel  [facility]..    Review of Systems  Constitutional: Negative.   HENT: Negative.   Eyes: Negative.   Respiratory: Negative.   Cardiovascular:  Negative.   Endocrine: Negative.   Genitourinary: Negative.   Musculoskeletal: Positive for back pain and gait problem. Negative for arthralgias, joint swelling, myalgias, neck pain and neck stiffness.  Skin: Negative.   Hematological: Negative.   Psychiatric/Behavioral: Positive for sleep disturbance. Negative for agitation, behavioral problems, confusion, decreased concentration, dysphoric mood, hallucinations, self-injury and suicidal ideas. The patient is nervous/anxious. The patient is not hyperactive.      Objective: Vital Signs: BP 136/80 (BP Location: Right Arm, Patient Position: Sitting)   Pulse 72   Ht 5' 7.5" (1.715 m)   Wt 211 lb (95.7 kg)   BMI 32.56 kg/m   Physical Exam Constitutional:      Appearance: She is well-developed.  HENT:     Head: Normocephalic and atraumatic.  Eyes:     Pupils: Pupils are equal, round, and reactive to light.  Pulmonary:     Effort: Pulmonary effort is normal.     Breath sounds: Normal breath sounds.  Abdominal:     General: Bowel sounds are normal.     Palpations: Abdomen is soft.  Musculoskeletal:     Cervical back: Normal range of motion and neck supple.     Lumbar back: Negative right straight leg raise test and negative left straight leg raise test.  Skin:    General: Skin is warm and dry.  Neurological:  Mental Status: She is alert and oriented to person, place, and time.  Psychiatric:        Behavior: Behavior normal.        Thought Content: Thought content normal.        Judgment: Judgment normal.     Back Exam   Tenderness  The patient is experiencing tenderness in the lumbar.  Range of Motion  Extension: abnormal  Flexion: abnormal  Lateral bend right: abnormal  Lateral bend left: abnormal  Rotation right: abnormal  Rotation left: abnormal   Muscle Strength  Right Quadriceps:  5/5  Left Quadriceps:  5/5  Right Hamstrings:  5/5  Left Hamstrings:  5/5   Tests  Straight leg raise right:  negative Straight leg raise left: negative  Reflexes  Patellar: 0/4 Achilles: 0/4  Other  Toe walk: normal Heel walk: normal Sensation: normal Gait: normal  Erythema: no back redness Scars: present      Specialty Comments:  No specialty comments available.  Imaging: No results found.   PMFS History: Patient Active Problem List   Diagnosis Date Noted  . Trochanteric bursitis, left hip 01/18/2020  . Lumbar spinal stenosis 12/19/2017  . Pain in joint, ankle and foot 05/02/2016  . Chronic venous insufficiency 05/02/2016  . Peripheral edema 11/01/2015  . Intractable chronic migraine without aura 11/02/2014  . Tremor 04/13/2014  . Migraine without aura 04/13/2014  . Pituitary microadenoma ([place]) 04/13/2014   Past Medical History:  Diagnosis Date  . Arthritis   . Depression   . Dyslipidemia   . Hypertension   . Migraine   . Migraine without aura, without mention of intractable migraine without mention of status migrainosus 04/13/2014  . Pituitary microadenoma ([place]) 04/13/2014  . Pre-diabetes    "i was told i was pre-diabetic a while ago"  . Tremor 04/13/2014    Family History  Problem Relation Age of Onset  . Cancer Father        stomach cancer  . Migraines Mother     Past Surgical History:  Procedure Laterality Date  . CHOLECYSTECTOMY    . COLONOSCOPY W/ POLYPECTOMY    . LUMBAR LAMINECTOMY/DECOMPRESSION MICRODISCECTOMY N/A 12/19/2017   Procedure: LAMINECTOMY LUMBAR TWO- LUMBAR THREE, LUMBAR THREE- LUMBAR FOUR, LUMBAR FOUR- LUMBAR FIVE ;  Surgeon: Consuella Lose, MD;  Location: [facility];  Service: Neurosurgery;  Laterality: N/A;  . NASAL SINUS SURGERY    . TONSILLECTOMY    . WISDOM TOOTH EXTRACTION     Social History   Occupational History  . Occupation: Retired  Tobacco Use  . Smoking status: Never Smoker  . Smokeless tobacco: Never Used  Vaping Use  . Vaping Use: Never used  Substance and Sexual Activity  . Alcohol use: Yes    Alcohol/week: 0.0  standard drinks    Comment: occas.  . Drug use: No  . Sexual activity: Not on file

## 2020-05-15 NOTE — Patient Instructions (Signed)
Avoid bending, stooping and avoid lifting weights greater than 10 lbs. Avoid prolong standing and walking. Avoid frequent bending and stooping  No lifting greater than 10 lbs. May use ice or moist heat for pain. Weight loss is of benefit. Handicap license is approved. Will refer you to [facility] for evaluation for consideration of  Long lumbar fusion L2 to S1 with XILFs and posterior instrumentation TLIF at L5-S1?

## 2020-05-18 ENCOUNTER — Other Ambulatory Visit: Payer: Self-pay | Admitting: Family Medicine

## 2020-05-25 ENCOUNTER — Other Ambulatory Visit: Payer: Self-pay | Admitting: Neurology

## 2020-05-30 ENCOUNTER — Telehealth: Payer: Self-pay | Admitting: Specialist

## 2020-05-30 NOTE — Telephone Encounter (Signed)
Pt called stating she was referred to a Dr. @ [facility] and then never heard anything from their office. Pt would like for [place] to try and see what the hold up is.  [phone]

## 2020-05-31 NOTE — Telephone Encounter (Signed)
I called and lmom that the information was sent on 05/16/20, I advised that it take several days due to the them having to review the notes that has been sent. They will call her as soon as they get to her information

## 2020-06-19 ENCOUNTER — Ambulatory Visit: Payer: Medicare Other | Admitting: Specialist

## 2020-07-06 ENCOUNTER — Telehealth: Payer: Self-pay | Admitting: Specialist

## 2020-07-06 ENCOUNTER — Other Ambulatory Visit: Payer: Self-pay | Admitting: Specialist

## 2020-07-06 MED ORDER — TRAMADOL-ACETAMINOPHEN 37.5-325 MG PO TABS: 1.0000 | ORAL_TABLET | Freq: Four times a day (QID) | ORAL | 0 refills | Status: DC | PRN

## 2020-07-06 NOTE — Telephone Encounter (Signed)
I called and lmom for pt that we sent in an rx to [facility] in chart.

## 2020-07-06 NOTE — Telephone Encounter (Signed)
Patient called advised she has a pinched nerve in her lower back. Patient asked if Alyse Low would call her and maybe she can get something called in for her. Patient said she only wants to see Dr Louanne Skye for an appointment. Patient said she can not wait for an appointment next week. Patient said her family is coming in from out of town and she didn't want to be feeling so bad when they come. The number to contact patient is [phone]

## 2020-07-13 ENCOUNTER — Ambulatory Visit: Payer: Medicare Other | Admitting: Neurology

## 2020-07-13 ENCOUNTER — Encounter: Payer: Self-pay | Admitting: Emergency Medicine

## 2020-07-13 NOTE — Telephone Encounter (Signed)
Received [place] PA, patient has not been seen in office since 07/2019.  No show on an appointment in June 2021.  Called and left message for patient to call office back to schedule office visit.  Also sent patient a message through [facility].  Awaiting call back.

## 2020-07-17 NOTE — Telephone Encounter (Signed)
Patient scheduled visit on 07/13/20 and called and cancelled.  Patient encouraged to reschedule visit for follow up and further refill of medication.  Patient stated she would call back to reschedule.

## 2020-07-31 HISTORY — PX: LUMBAR LAMINECTOMY: SHX95

## 2020-08-09 ENCOUNTER — Other Ambulatory Visit: Payer: Self-pay | Admitting: Neurology

## 2020-08-21 ENCOUNTER — Ambulatory Visit: Payer: Self-pay

## 2020-08-21 ENCOUNTER — Ambulatory Visit (INDEPENDENT_AMBULATORY_CARE_PROVIDER_SITE_OTHER): Payer: Medicare Other | Admitting: Specialist

## 2020-08-21 ENCOUNTER — Telehealth: Payer: Self-pay

## 2020-08-21 ENCOUNTER — Other Ambulatory Visit: Payer: Self-pay

## 2020-08-21 ENCOUNTER — Encounter: Payer: Self-pay | Admitting: Specialist

## 2020-08-21 ENCOUNTER — Ambulatory Visit: Payer: Medicare Other | Admitting: Orthopaedic Surgery

## 2020-08-21 VITALS — BP 202/75 | HR 84 | Ht 67.5 in | Wt 211.0 lb

## 2020-08-21 DIAGNOSIS — M5136 Other intervertebral disc degeneration, lumbar region: Secondary | ICD-10-CM

## 2020-08-21 DIAGNOSIS — M25559 Pain in unspecified hip: Secondary | ICD-10-CM | POA: Diagnosis not present

## 2020-08-21 MED ORDER — TRAMADOL-ACETAMINOPHEN 37.5-325 MG PO TABS: 1.0000 | ORAL_TABLET | Freq: Four times a day (QID) | ORAL | 0 refills | Status: DC | PRN

## 2020-08-21 MED ORDER — METHYLPREDNISOLONE 4 MG PO TABS
4.0000 mg | ORAL_TABLET | Freq: Every day | ORAL | 0 refills | Status: DC
Start: 2020-08-21 — End: 2021-06-27

## 2020-08-21 NOTE — Telephone Encounter (Signed)
FYI--I called patient and advised that [facility] had tried to call her but they could not reach her by the messages attached to the referrals.  She states that she just got back home Saturday from [place] after having back surgery there at [facility]. She states that her back is doing fine but she is having a lot of left hip pain and she would like an xray of this done.  She is scheduled for 10:45am, but she says that may be pushing it to get here, that she needs to shower and her husband has parkinson's and he is not up moving yet, I advised that we do allow up to 15 mins past your appt but at 16 mins they can have to reschedule you.  She states that she understands and that she will be seen today due to the effort that it took her to get here.

## 2020-08-21 NOTE — Telephone Encounter (Signed)
Patient [name] on triage phone stating that she was having severe left hip pain and needs to see Dr Kristin Gilmore ASAP. She is taking Norco and Ultram and these are not helping. Said that she is unable to get up without assistance.  Also is asking about a referral to [facility]. She said Dr Kristin Gilmore told her 19weeks ago they would be calling her about surgery and she has not heard anything. Can you please call her to further discuss. [phone]

## 2020-08-21 NOTE — Progress Notes (Signed)
Office Visit Note   Patient: Kristin Gilmore           Date of Birth: [birth year]           MRN: [id number] Visit Date: 08/21/2020              Requested by: Aletha Halim., PA-C [address],  [place] [address] PCP: Aletha Halim., PA-C   Assessment & Plan: Visit Diagnoses:  1. Hip pain     Plan: Avoid frequent bending and stooping  No lifting greater than 10 lbs. May use ice or moist heat for pain. Weight loss is of benefit. Best medication for lumbar disc disease is arthritis medications like motrin, celebrex and naprosyn. Exercise is important to improve your indurance and does allow people to function better inspite of back pain. As the Back is painful and most of the leg pain is better, it is likely that the pressure on the nerve is better but Mechanical pain due to disc degeneration or inflamation and or arthritis joint pain may be the cause of the ongoing Discomfort.  Given a Depomedrol 120 mg IM given and will use systemic steroid for 6 days.    Follow-Up Instructions: No follow-ups on file.   Orders:  Orders Placed This Encounter  Procedures  . XR HIP UNILAT W OR W/O PELVIS 2-3 VIEWS LEFT   No orders of the defined types were placed in this encounter.     Procedures: No procedures performed   Clinical Data: No additional findings.   Subjective: Chief Complaint  Patient presents with  . Left Hip - Pain    69 year old female with history of left L2-3, L3-4, L4-5 and L5-S1 foramintomies with discectomies and has had good improvement in her overall pain pattern. There is however severe pain in the left buttock and it is present at night and is present with prolong sitting and bending or stooping or lifting. She was advised to avoid these after her surgery in [place] about one month ago at [facility].    Review of Systems  Constitutional: Negative.   HENT: Negative.   Eyes: Negative.   Respiratory: Negative.     Cardiovascular: Negative.   Gastrointestinal: Negative.   Endocrine: Negative.   Genitourinary: Negative.   Musculoskeletal: Negative.   Skin: Negative.   Allergic/Immunologic: Negative.   Neurological: Negative.   Hematological: Negative.   Psychiatric/Behavioral: Negative.      Objective: Vital Signs: BP (!) 202/75 (BP Location: Left Leg, Patient Position: Sitting)   Pulse 84   Ht 5' 7.5" (1.715 m)   Wt 211 lb (95.7 kg)   BMI 32.56 kg/m   Physical Exam Constitutional:      Appearance: She is well-developed.  HENT:     Head: Normocephalic and atraumatic.  Eyes:     Pupils: Pupils are equal, round, and reactive to light.  Pulmonary:     Effort: Pulmonary effort is normal.     Breath sounds: Normal breath sounds.  Abdominal:     General: Bowel sounds are normal.     Palpations: Abdomen is soft.  Musculoskeletal:        General: Normal range of motion.     Cervical back: Normal range of motion and neck supple.  Skin:    General: Skin is warm and dry.  Neurological:     Mental Status: She is alert and oriented to person, place, and time.  Psychiatric:  Behavior: Behavior normal.        Thought Content: Thought content normal.        Judgment: Judgment normal.     Back Exam   Muscle Strength  Right Quadriceps:  5/5  Left Quadriceps:  5/5  Right Hamstrings:  5/5  Left Hamstrings:  5/5       Specialty Comments:  No specialty comments available.  Imaging: No results found.   PMFS History: Patient Active Problem List   Diagnosis Date Noted  . Trochanteric bursitis, left hip 01/18/2020  . Lumbar spinal stenosis 12/19/2017  . Pain in joint, ankle and foot 05/02/2016  . Chronic venous insufficiency 05/02/2016  . Peripheral edema 11/01/2015  . Intractable chronic migraine without aura 11/02/2014  . Tremor 04/13/2014  . Migraine without aura 04/13/2014  . Pituitary microadenoma ([place]) 04/13/2014   Past Medical History:  Diagnosis Date  .  Arthritis   . Depression   . Dyslipidemia   . Hypertension   . Migraine   . Migraine without aura, without mention of intractable migraine without mention of status migrainosus 04/13/2014  . Pituitary microadenoma ([place]) 04/13/2014  . Pre-diabetes    "i was told i was pre-diabetic a while ago"  . Tremor 04/13/2014    Family History  Problem Relation Age of Onset  . Cancer Father        stomach cancer  . Migraines Mother     Past Surgical History:  Procedure Laterality Date  . CHOLECYSTECTOMY    . COLONOSCOPY W/ POLYPECTOMY    . LUMBAR LAMINECTOMY/DECOMPRESSION MICRODISCECTOMY N/A 12/19/2017   Procedure: LAMINECTOMY LUMBAR TWO- LUMBAR THREE, LUMBAR THREE- LUMBAR FOUR, LUMBAR FOUR- LUMBAR FIVE ;  Surgeon: Consuella Lose, MD;  Location: [facility];  Service: Neurosurgery;  Laterality: N/A;  . NASAL SINUS SURGERY    . TONSILLECTOMY    . WISDOM TOOTH EXTRACTION     Social History   Occupational History  . Occupation: Retired  Tobacco Use  . Smoking status: Never Smoker  . Smokeless tobacco: Never Used  Vaping Use  . Vaping Use: Never used  Substance and Sexual Activity  . Alcohol use: Yes    Alcohol/week: 0.0 standard drinks    Comment: occas.  . Drug use: No  . Sexual activity: Not on file

## 2020-08-21 NOTE — Progress Notes (Signed)
Generic Depo Medrol 120 mg (1.5 mL of 80 mg/mL) given IM left dorsogluteal region. [employer] [employer]: [id number] B Exp: 01/22

## 2020-08-21 NOTE — Patient Instructions (Signed)
Plan: Avoid frequent bending and stooping  No lifting greater than 10 lbs. May use ice or moist heat for pain. Weight loss is of benefit. Exercise is important to improve your indurance and does allow people to function better inspite of back pain. As the Back is painful and most of the leg pain is better, it is likely that the pressure on the nerve is better but Mechanical pain due to disc degeneration or inflamation and or arthritis joint pain may be the cause of the ongoing Discomfort.  Given a Depomedrol 120 mg IM given and will use systemic steroid for 6 days.

## 2020-08-21 NOTE — Telephone Encounter (Signed)
Dr. Louanne Skye had cancellation this afternoon and Alyse Low worked patient into his schedule.

## 2020-08-22 ENCOUNTER — Other Ambulatory Visit: Payer: Self-pay | Admitting: Neurology

## 2020-08-28 ENCOUNTER — Other Ambulatory Visit: Payer: Self-pay

## 2020-08-28 ENCOUNTER — Telehealth: Payer: Self-pay | Admitting: Radiology

## 2020-08-28 ENCOUNTER — Ambulatory Visit: Payer: Medicare Other | Admitting: Neurology

## 2020-08-28 ENCOUNTER — Ambulatory Visit: Payer: Medicare Other | Admitting: Specialist

## 2020-08-28 ENCOUNTER — Other Ambulatory Visit: Payer: Self-pay | Admitting: Specialist

## 2020-08-28 DIAGNOSIS — Z9889 Other specified postprocedural states: Secondary | ICD-10-CM

## 2020-08-28 NOTE — Telephone Encounter (Signed)
Patient came in today for her appt, and when advised that we had reschedule she asked if she had to come back, she states that she is doing better, and that the injection has helped her, she states that she would like to get a referral to PT for her back and Left hip. Please advise.

## 2020-08-28 NOTE — Progress Notes (Deleted)
PATIENT: Kristin Gilmore DOB: [birth year]  REASON FOR VISIT: follow up HISTORY FROM: patient  HISTORY OF PRESENT ILLNESS: Today 08/28/20  Kristin Gilmore is a 69 year old female with history of headaches. Had NCV/EMG in April 2021, showed mild peripheral neuropathy, EMG left leg showed findings consistent with acute L4 and L5 radiculopathy  HISTORY  07/05/2019 Dr. Jannifer Franklin: Kristin Gilmore is a 69 year old right-handed white female with a history of headaches.  The patient was last seen in July 2019, she was placed on amitriptyline at that time, she indicates that her headaches disappeared completely and she went off the medication.  She has done quite well until around Labor Day of this year when for some reason the headaches started back again.  The patient notes that the headaches are bitemporal in nature and are daily.  The patient denies any neck pain or neck stiffness which may have pain into the ears.  She also reports that she gets up frequently at night to urinate, but she gets back to sleep easily.  She occasionally will have stomach pains and takes dicyclomine for this.  She returns to the office today for an evaluation.  REVIEW OF SYSTEMS: Out of a complete 14 system review of symptoms, the patient complains only of the following symptoms, and all other reviewed systems are negative.  ALLERGIES: Allergies  Allergen Reactions  . Amoxicillin Hives    UNSPECIFIED REACTION  Has patient had a PCN reaction causing immediate rash, facial/tongue/throat swelling, SOB or lightheadedness with hypotension: Unknown Has patient had a PCN reaction causing severe rash involving mucus membranes or skin necrosis: Unknown Has patient had a PCN reaction that required hospitalization: Unknown Has patient had a PCN reaction occurring within the last 10 years: No If all of the above answers are "NO", then may proceed with Cephalosporin use.   . Clarithromycin Hives, Other (See Comments) and Rash   . Atropine Hives    HOME MEDICATIONS: Outpatient Medications Prior to Visit  Medication Sig Dispense Refill  . acetaminophen (TYLENOL) 500 MG tablet Take 1,000 mg by mouth 2 (two) times daily.     Marland Kitchen amitriptyline (ELAVIL) 10 MG tablet Take 2 tablets (20 mg total) by mouth at bedtime. 180 tablet 1  . amLODipine (NORVASC) 5 MG tablet amlodipine 5 mg tablet  TAKE 1 TABLET BY MOUTH TWICE A DAY    . baclofen (LIORESAL) 10 MG tablet TAKE 0.5-1 TABLETS (5-10 MG TOTAL) BY MOUTH 3 (THREE) TIMES DAILY AS NEEDED FOR MUSCLE SPASMS. 30 tablet 3  . Calcium Citrate (CAL-CITRATE PO) Take 1-2 tablets by mouth 2 (two) times daily. TAKE 1 TABLET IN THE MORNING & TAKE 2 TABLETS WITH SUPPER    . carvedilol (COREG) 25 MG tablet Take a half pill twice per day    . chlorproMAZINE (THORAZINE) 50 MG tablet 1 tablet twice daily for 6 doses 6 tablet 1  . Cholecalciferol (VITAMIN D3) 2000 UNITS TABS Take 2,000 Units by mouth daily with supper.     . Coenzyme Q10 (COQ10) 100 MG CAPS Take 100 mg by mouth daily with supper.    . ferrous sulfate 325 (65 FE) MG EC tablet Take 1 tablet by mouth 2 (two) times daily.    . fluticasone (FLONASE) 50 MCG/ACT nasal spray Place 1 spray into both nostrils daily.     Marland Kitchen gabapentin (NEURONTIN) 300 MG capsule Take 300 mg by mouth every 6 (six) hours as needed.     Marland Kitchen glimepiride (AMARYL) 2 MG tablet Take 2  mg by mouth every morning.    . Glucosamine-Chondroitin (COSAMIN DS PO) Take 3 capsules by mouth daily. 1500 glucosamine    . hydrochlorothiazide (HYDRODIURIL) 25 MG tablet Take 25 mg by mouth daily.    Marland Kitchen loperamide (IMODIUM) 2 MG capsule Take 2-4 mg by mouth 2 (two) times daily as needed for diarrhea or loose stools.    Marland Kitchen MAGNESIUM SULFATE PO Take 1 tablet by mouth at bedtime.     . methylPREDNISolone (MEDROL) 4 MG tablet Take 1 tablet (4 mg total) by mouth daily. 10 tablet 0  . nabumetone (RELAFEN) 750 MG tablet TAKE 1 TABLET (750 MG TOTAL) BY MOUTH 2 (TWO) TIMES DAILY AS NEEDED. 60  tablet 6  . nystatin cream (MYCOSTATIN) APPLY TO AREA TWICE A DAY    . Omega 3 1000 MG CAPS Take 1,000 mg by mouth 2 (two) times daily. SUPER OMEGA-3    . OVER THE COUNTER MEDICATION Take 2 capsules by mouth daily with supper. Quick 6 Weight Loss Supplement    . Probiotic Product (PROBIOTIC PO) Take 1 capsule by mouth daily. SUPREMA DOPHILUS 5 BILLION CFU    . propranolol (INDERAL) 10 MG tablet TAKE 1 TABLET (10 MG TOTAL) BY MOUTH 2 (TWO) TIMES DAILY AS NEEDED (FOR HAND TREMORS). 180 tablet 0  . QUEtiapine (SEROQUEL) 300 MG tablet TAKE 0.5 TABLETS (150 MG TOTAL) BY MOUTH AT BEDTIME. 45 tablet 1  . Red Yeast Rice 600 MG CAPS Take 1,200 mg by mouth 2 (two) times daily.    Marland Kitchen ROYAL JELLY PO Take 150 mg by mouth daily.    . SUMAtriptan (IMITREX) 50 MG tablet 1 AS NEEDED FOR HEADACHE MAY REPEAT IN 2HR IF HEADACHE PERSISTS/RECURS NO MORE THAN 2/DAY OR 4/WK 10 tablet 0  . telmisartan (MICARDIS) 80 MG tablet telmisartan 80 mg tablet    . tiZANidine (ZANAFLEX) 2 MG tablet TAKE 1-2 TABLETS (2-4 MG TOTAL) BY MOUTH EVERY 6 (SIX) HOURS AS NEEDED FOR MUSCLE SPASMS. 60 tablet 1  . traMADol-acetaminophen (ULTRACET) 37.5-325 MG tablet Take 1 tablet by mouth every 6 (six) hours as needed. 30 tablet 0  . traMADol-acetaminophen (ULTRACET) 37.5-325 MG tablet Take 1 tablet by mouth every 6 (six) hours as needed. 30 tablet 0  . TURMERIC PO Take 475 mg by mouth 2 (two) times daily. WITH FOOD    . vitamin E 400 UNIT capsule Take 400 Units by mouth daily with supper.      No facility-administered medications prior to visit.    PAST MEDICAL HISTORY: Past Medical History:  Diagnosis Date  . Arthritis   . Depression   . Dyslipidemia   . Hypertension   . Migraine   . Migraine without aura, without mention of intractable migraine without mention of status migrainosus 04/13/2014  . Pituitary microadenoma ([place]) 04/13/2014  . Pre-diabetes    "i was told i was pre-diabetic a while ago"  . Tremor 04/13/2014    PAST  SURGICAL HISTORY: Past Surgical History:  Procedure Laterality Date  . CHOLECYSTECTOMY    . COLONOSCOPY W/ POLYPECTOMY    . LUMBAR LAMINECTOMY/DECOMPRESSION MICRODISCECTOMY N/A 12/19/2017   Procedure: LAMINECTOMY LUMBAR TWO- LUMBAR THREE, LUMBAR THREE- LUMBAR FOUR, LUMBAR FOUR- LUMBAR FIVE ;  Surgeon: Consuella Lose, MD;  Location: [facility];  Service: Neurosurgery;  Laterality: N/A;  . NASAL SINUS SURGERY    . TONSILLECTOMY    . WISDOM TOOTH EXTRACTION      FAMILY HISTORY: Family History  Problem Relation Age of Onset  . Cancer  Father        stomach cancer  . Migraines Mother     SOCIAL HISTORY: Social History   Socioeconomic History  . Marital status: Married    Spouse name: Not on file  . Number of children: 1  . Years of education: college  . Highest education level: Not on file  Occupational History  . Occupation: Retired  Tobacco Use  . Smoking status: Never Smoker  . Smokeless tobacco: Never Used  Vaping Use  . Vaping Use: Never used  Substance and Sexual Activity  . Alcohol use: Yes    Alcohol/week: 0.0 standard drinks    Comment: occas.  . Drug use: No  . Sexual activity: Not on file  Other Topics Concern  . Not on file  Social History Narrative   Lives at home, married   Patient does not drink caffeine.   Patient is right handed.   Social Determinants of Health   Financial Resource Strain:   . Difficulty of Paying Living Expenses: Not on file  Food Insecurity:   . Worried About Charity fundraiser in the Last Year: Not on file  . Ran Out of Food in the Last Year: Not on file  Transportation Needs:   . Lack of Transportation (Medical): Not on file  . Lack of Transportation (Non-Medical): Not on file  Physical Activity:   . Days of Exercise per Week: Not on file  . Minutes of Exercise per Session: Not on file  Stress:   . Feeling of Stress : Not on file  Social Connections:   . Frequency of Communication with Friends and Family: Not on file  .  Frequency of Social Gatherings with Friends and Family: Not on file  . Attends Religious Services: Not on file  . Active Member of Clubs or Organizations: Not on file  . Attends Archivist Meetings: Not on file  . Marital Status: Not on file  Intimate Partner Violence:   . Fear of Current or Ex-Partner: Not on file  . Emotionally Abused: Not on file  . Physically Abused: Not on file  . Sexually Abused: Not on file      PHYSICAL EXAM  There were no vitals filed for this visit. There is no height or weight on file to calculate BMI.  Generalized: Well developed, in no acute distress   Neurological examination  Mentation: Alert oriented to time, place, history taking. Follows all commands speech and language fluent Cranial nerve II-XII: Pupils were equal round reactive to light. Extraocular movements were full, visual field were full on confrontational test. Facial sensation and strength were normal. Uvula tongue midline. Head turning and shoulder shrug  were normal and symmetric. Motor: The motor testing reveals 5 over 5 strength of all 4 extremities. Good symmetric motor tone is noted throughout.  Sensory: Sensory testing is intact to soft touch on all 4 extremities. No evidence of extinction is noted.  Coordination: Cerebellar testing reveals good finger-nose-finger and heel-to-shin bilaterally.  Gait and station: Gait is normal. Tandem gait is normal. Romberg is negative. No drift is seen.  Reflexes: Deep tendon reflexes are symmetric and normal bilaterally.   DIAGNOSTIC DATA (LABS, IMAGING, TESTING) - I reviewed patient records, labs, notes, testing and imaging myself where available.  Lab Results  Component Value Date   WBC 12.1 (H) 12/20/2017   HGB 13.0 12/20/2017   HCT 39.7 12/20/2017   MCV 93.2 12/20/2017   PLT 243 12/20/2017  Component Value Date/Time   NA 134 (L) 12/20/2017 0403   NA 142 11/01/2015 1232   K 4.6 12/20/2017 0403   CL 97 (L) 12/20/2017  0403   CO2 25 12/20/2017 0403   GLUCOSE 149 (H) 12/20/2017 0403   BUN 17 12/20/2017 0403   BUN 29 (H) 11/01/2015 1232   CREATININE 0.91 12/20/2017 0403   CALCIUM 8.6 (L) 12/20/2017 0403   PROT 6.5 11/01/2015 1232   ALBUMIN 4.5 11/01/2015 1232   AST 28 11/01/2015 1232   ALT 37 (H) 11/01/2015 1232   ALKPHOS 91 11/01/2015 1232   BILITOT 0.3 11/01/2015 1232   GFRNONAA >60 12/20/2017 0403   GFRAA >60 12/20/2017 0403   Lab Results  Component Value Date   CHOL  04/07/2008    166        ATP III CLASSIFICATION:  <200     mg/dL   Desirable  200-239  mg/dL   Borderline High  >=240    mg/dL   High   HDL 64 04/07/2008   LDLCALC  04/07/2008    85        Total Cholesterol/HDL:CHD Risk Coronary Heart Disease Risk Table                     Men   Women  1/2 Average Risk   3.4   3.3   TRIG 86 04/07/2008   CHOLHDL 2.6 04/07/2008   No results found for: HGBA1C Lab Results  Component Value Date   VITAMINB12 338 11/01/2015   Lab Results  Component Value Date   TSH 0.969 11/01/2015      ASSESSMENT AND PLAN 69 y.o. year old female  has a past medical history of Arthritis, Depression, Dyslipidemia, Hypertension, Migraine, Migraine without aura, without mention of intractable migraine without mention of status migrainosus (04/13/2014), Pituitary microadenoma ([place]) (04/13/2014), Pre-diabetes, and Tremor (04/13/2014). here with ***   I spent 15 minutes with the patient. 50% of this time was spent   Butler Denmark, [place], DNP 08/28/2020, 5:40 AM [facility] [address], [place], [place] [address] [phone]

## 2020-08-28 NOTE — Telephone Encounter (Signed)
Order for PT Placed at [facility].

## 2020-08-29 NOTE — Telephone Encounter (Signed)
I called patient to let her know that the order was put in, when she answered the phone she was really short with me asking me "what do you want, I did not sleep last due to a pulled muscle in her back and her ribs". I advised who I was and that we had put an order in for PT at [facility] and she said she could not go there that they would not see her due to "getting into it with them before" and that she wanted to come to our office, I changed the order for her to come to our office for PT.

## 2020-08-30 ENCOUNTER — Ambulatory Visit (INDEPENDENT_AMBULATORY_CARE_PROVIDER_SITE_OTHER): Payer: Medicare Other | Admitting: Neurology

## 2020-08-30 ENCOUNTER — Encounter: Payer: Self-pay | Admitting: Neurology

## 2020-08-30 VITALS — BP 140/88 | HR 72

## 2020-08-30 DIAGNOSIS — R251 Tremor, unspecified: Secondary | ICD-10-CM

## 2020-08-30 DIAGNOSIS — G43719 Chronic migraine without aura, intractable, without status migrainosus: Secondary | ICD-10-CM

## 2020-08-30 MED ORDER — SUMATRIPTAN SUCCINATE 50 MG PO TABS: ORAL_TABLET | ORAL | 5 refills | Status: DC

## 2020-08-30 MED ORDER — PROPRANOLOL HCL 20 MG PO TABS: ORAL_TABLET | ORAL | 5 refills | Status: DC

## 2020-08-30 MED ORDER — AMITRIPTYLINE HCL 10 MG PO TABS: 20.0000 mg | ORAL_TABLET | Freq: Every day | ORAL | 1 refills | Status: DC

## 2020-08-30 NOTE — Patient Instructions (Addendum)
Try propranolol 20 mg twice daily x 2 weeks, then increase to 40 mg twice daily for tremor Continue the amtriptyline for headache prevention  Continue the Imitrex for acute headache  See you back in 6 months  Propranolol Tablets What is this medicine? PROPRANOLOL (proe PRAN oh lole) is a beta blocker. It decreases the amount of work your heart has to do and helps your heart beat regularly. It treats high blood pressure and/or prevent chest pain (also called angina). It is also used after a heart attack to prevent a second one. This medicine may be used for other purposes; ask your health care provider or pharmacist if you have questions. COMMON BRAND NAME(S): Inderal What should I tell my health care provider before I take this medicine? They need to know if you have any of these conditions:  circulation problems or blood vessel disease  diabetes  history of heart attack or heart disease, vasospastic angina  kidney disease  liver disease  lung or breathing disease, like asthma or emphysema  pheochromocytoma  slow heart rate  thyroid disease  an unusual or allergic reaction to propranolol, other beta-blockers, medicines, foods, dyes, or preservatives  pregnant or trying to get pregnant  breast-feeding How should I use this medicine? Take this drug by mouth. Take it as directed on the prescription label at the same time every day. Keep taking it unless your health care provider tells you to stop. Talk to your health care provider about the use of this drug in children. Special care may be needed. Overdosage: If you think you have taken too much of this medicine contact a poison control center or emergency room at once. NOTE: This medicine is only for you. Do not share this medicine with others. What if I miss a dose? If you miss a dose, take it as soon as you can. If it is almost time for your next dose, take only that dose. Do not take double or extra doses. What may  interact with this medicine? Do not take this medicine with any of the following medications:  feverfew  phenothiazines like chlorpromazine, mesoridazine, prochlorperazine, thioridazine This medicine may also interact with the following medications:  aluminum hydroxide gel  antipyrine  antiviral medicines for HIV or AIDS  barbiturates like phenobarbital  certain medicines for blood pressure, heart disease, irregular heart beat  cimetidine  ciprofloxacin  diazepam  fluconazole  haloperidol  isoniazid  medicines for cholesterol like cholestyramine or colestipol  medicines for mental depression  medicines for migraine headache like almotriptan, eletriptan, frovatriptan, naratriptan, rizatriptan, sumatriptan, zolmitriptan  NSAIDs, medicines for pain and inflammation, like ibuprofen or naproxen  phenytoin  rifampin  teniposide  theophylline  thyroid medicines  tolbutamide  warfarin  zileuton This list may not describe all possible interactions. Give your health care provider a list of all the medicines, herbs, non-prescription drugs, or dietary supplements you use. Also tell them if you smoke, drink alcohol, or use illegal drugs. Some items may interact with your medicine. What should I watch for while using this medicine? Visit your doctor or health care professional for regular check ups. Check your blood pressure and pulse rate regularly. Ask your health care professional what your blood pressure and pulse rate should be, and when you should contact them. You may get drowsy or dizzy. Do not drive, use machinery, or do anything that needs mental alertness until you know how this drug affects you. Do not stand or sit up quickly, especially if you  are an older patient. This reduces the risk of dizzy or fainting spells. Alcohol can make you more drowsy and dizzy. Avoid alcoholic drinks. This medicine may increase blood sugar. Ask your healthcare provider if changes  in diet or medicines are needed if you have diabetes. Do not treat yourself for coughs, colds, or pain while you are taking this medicine without asking your doctor or health care professional for advice. Some ingredients may increase your blood pressure. What side effects may I notice from receiving this medicine? Side effects that you should report to your doctor or health care professional as soon as possible:  allergic reactions like skin rash, itching or hives, swelling of the face, lips, or tongue  breathing problems  cold hands or feet  difficulty sleeping, nightmares  dry peeling skin  hallucinations  muscle cramps or weakness   signs and symptoms of high blood sugar such as being more thirsty or hungry or having to urinate more than normal. You may also feel very tired or have blurry vision.  slow heart rate  swelling of the legs and ankles  vomiting Side effects that usually do not require medical attention (report to your doctor or health care professional if they continue or are bothersome):  change in sex drive or performance  diarrhea  dry sore eyes  hair loss  nausea  weak or tired This list may not describe all possible side effects. Call your doctor for medical advice about side effects. You may report side effects to FDA at 1-800-FDA-1088. Where should I keep my medicine? Keep out of the reach of children and pets. Store at room temperature between 20 and 25 degrees C (68 and 77 degrees F). Protect from light. Throw away any unused drug after the expiration date. NOTE: This sheet is a summary. It may not cover all possible information. If you have questions about this medicine, talk to your doctor, pharmacist, or health care provider.  2020 Elsevier/Gold Standard (2019-04-23 19:25:51)

## 2020-08-30 NOTE — Progress Notes (Signed)
PATIENT: Kristin Gilmore DOB: 02/16/1951  REASON FOR VISIT: follow up HISTORY FROM: patient  HISTORY OF PRESENT ILLNESS: Today 08/30/20 Kristin Gilmore is a 69 year old female with history of headaches and tremor.  Headaches are bitemporal in nature.  Has been given Depacon injection, prednisone taper, Thorazine, and sumatriptan for acute headaches.  Headaches come and go, can be every other day.  Sounds like 1 significant headache a week, sumatriptan works best, relieves the headache right away.  Alternative is Thorazine.  For milder headaches will take Tylenol ibuprofen with good benefit.  Remains on amitriptyline, only taking 10 mg at bedtime, had dry mouth with 20 mg.  Happy with headache control, does not want any new medications.  Had NCV in April 2021, of both lower extremities, showing findings consistent with mild peripheral neuropathy, EMG of left lower extremity showed findings consistent with acute L4 and L5 radiculopathy, some possible involvement of L3 as well, seeing Dr. Louanne Skye.  Had 4 outpatient back surgeries and for the last month in [place].  Was a big success, no pain.  Tremor, has bilateral resting tremor now, used to just be the left hand.  Does not affect eating or most actions, is still able to sew but slowly.  Most bothersome at night when she is reading, holding her book.  No other signs of Parkinson's, her husband has Parkinson's, she knows what to look for.  Has been taking propanolol 10-20 mg as needed for tremor, does find this helpful.  No longer takes Seroquel, or see psychiatry.  Depression well controlled.  Presents today for evaluation unaccompanied.  HISTORY 07/05/2019 Dr. Jannifer Franklin: Kristin Gilmore is a 69 year old right-handed white female with a history of headaches.  The patient was last seen in July 2019, she was placed on amitriptyline at that time, she indicates that her headaches disappeared completely and she went off the medication.  She has done quite  well until around Labor Day of this year when for some reason the headaches started back again.  The patient notes that the headaches are bitemporal in nature and are daily.  The patient denies any neck pain or neck stiffness which may have pain into the ears.  She also reports that she gets up frequently at night to urinate, but she gets back to sleep easily.  She occasionally will have stomach pains and takes dicyclomine for this.  She returns to the office today for an evaluation.   REVIEW OF SYSTEMS: Out of a complete 14 system review of symptoms, the patient complains only of the following symptoms, and all other reviewed systems are negative.  Tremor, headache  ALLERGIES: Allergies  Allergen Reactions  . Amoxicillin Hives    UNSPECIFIED REACTION  Has patient had a PCN reaction causing immediate rash, facial/tongue/throat swelling, SOB or lightheadedness with hypotension: Unknown Has patient had a PCN reaction causing severe rash involving mucus membranes or skin necrosis: Unknown Has patient had a PCN reaction that required hospitalization: Unknown Has patient had a PCN reaction occurring within the last 10 years: No If all of the above answers are "NO", then may proceed with Cephalosporin use.   . Clarithromycin Hives, Other (See Comments) and Rash  . Atropine Hives    HOME MEDICATIONS: Outpatient Medications Prior to Visit  Medication Sig Dispense Refill  . acetaminophen (TYLENOL) 500 MG tablet Take 1,000 mg by mouth 2 (two) times daily.     Marland Kitchen amLODipine (NORVASC) 5 MG tablet amlodipine 5 mg tablet  TAKE 1 TABLET  BY MOUTH TWICE A DAY    . baclofen (LIORESAL) 10 MG tablet TAKE 0.5-1 TABLETS (5-10 MG TOTAL) BY MOUTH 3 (THREE) TIMES DAILY AS NEEDED FOR MUSCLE SPASMS. 30 tablet 3  . Calcium Citrate (CAL-CITRATE PO) Take 1-2 tablets by mouth 2 (two) times daily. TAKE 1 TABLET IN THE MORNING & TAKE 2 TABLETS WITH SUPPER    . carvedilol (COREG) 25 MG tablet Take a half pill twice per  day    . chlorproMAZINE (THORAZINE) 50 MG tablet 1 tablet twice daily for 6 doses 6 tablet 1  . Cholecalciferol (VITAMIN D3) 2000 UNITS TABS Take 2,000 Units by mouth daily with supper.     . Coenzyme Q10 (COQ10) 100 MG CAPS Take 100 mg by mouth daily with supper.    . ferrous sulfate 325 (65 FE) MG EC tablet Take 1 tablet by mouth 2 (two) times daily.    . fluticasone (FLONASE) 50 MCG/ACT nasal spray Place 1 spray into both nostrils daily.     Marland Kitchen gabapentin (NEURONTIN) 300 MG capsule Take 300 mg by mouth every 6 (six) hours as needed.     Marland Kitchen glimepiride (AMARYL) 2 MG tablet Take 2 mg by mouth every morning.    . Glucosamine-Chondroitin (COSAMIN DS PO) Take 3 capsules by mouth daily. 1500 glucosamine    . hydrochlorothiazide (HYDRODIURIL) 25 MG tablet Take 25 mg by mouth daily.    Marland Kitchen loperamide (IMODIUM) 2 MG capsule Take 2-4 mg by mouth 2 (two) times daily as needed for diarrhea or loose stools.    Marland Kitchen MAGNESIUM SULFATE PO Take 1 tablet by mouth at bedtime.     . methylPREDNISolone (MEDROL) 4 MG tablet Take 1 tablet (4 mg total) by mouth daily. 10 tablet 0  . nabumetone (RELAFEN) 750 MG tablet TAKE 1 TABLET (750 MG TOTAL) BY MOUTH 2 (TWO) TIMES DAILY AS NEEDED. 60 tablet 6  . nystatin cream (MYCOSTATIN) APPLY TO AREA TWICE A DAY    . Omega 3 1000 MG CAPS Take 1,000 mg by mouth 2 (two) times daily. SUPER OMEGA-3    . OVER THE COUNTER MEDICATION Take 2 capsules by mouth daily with supper. Quick 6 Weight Loss Supplement    . Probiotic Product (PROBIOTIC PO) Take 1 capsule by mouth daily. SUPREMA DOPHILUS 5 BILLION CFU    . QUEtiapine (SEROQUEL) 300 MG tablet TAKE 0.5 TABLETS (150 MG TOTAL) BY MOUTH AT BEDTIME. 45 tablet 1  . Red Yeast Rice 600 MG CAPS Take 1,200 mg by mouth 2 (two) times daily.    Marland Kitchen ROYAL JELLY PO Take 150 mg by mouth daily.    Marland Kitchen telmisartan (MICARDIS) 80 MG tablet telmisartan 80 mg tablet    . tiZANidine (ZANAFLEX) 2 MG tablet TAKE 1-2 TABLETS (2-4 MG TOTAL) BY MOUTH EVERY 6 (SIX)  HOURS AS NEEDED FOR MUSCLE SPASMS. 60 tablet 1  . traMADol-acetaminophen (ULTRACET) 37.5-325 MG tablet Take 1 tablet by mouth every 6 (six) hours as needed. 30 tablet 0  . traMADol-acetaminophen (ULTRACET) 37.5-325 MG tablet Take 1 tablet by mouth every 6 (six) hours as needed. 30 tablet 0  . TURMERIC PO Take 475 mg by mouth 2 (two) times daily. WITH FOOD    . vitamin E 400 UNIT capsule Take 400 Units by mouth daily with supper.     Marland Kitchen amitriptyline (ELAVIL) 10 MG tablet Take 2 tablets (20 mg total) by mouth at bedtime. 180 tablet 1  . propranolol (INDERAL) 10 MG tablet TAKE 1 TABLET (10 MG TOTAL)  BY MOUTH 2 (TWO) TIMES DAILY AS NEEDED (FOR HAND TREMORS). 180 tablet 0  . SUMAtriptan (IMITREX) 50 MG tablet 1 AS NEEDED FOR HEADACHE MAY REPEAT IN 2HR IF HEADACHE PERSISTS/RECURS NO MORE THAN 2/DAY OR 4/WK 10 tablet 0   No facility-administered medications prior to visit.    PAST MEDICAL HISTORY: Past Medical History:  Diagnosis Date  . Arthritis   . Depression   . Dyslipidemia   . Hypertension   . Migraine   . Migraine without aura, without mention of intractable migraine without mention of status migrainosus 04/13/2014  . 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 Pre-diabetes    "i was told i was pre-diabetic a while ago"  . Tremor 04/13/2014    PAST SURGICAL HISTORY: Past Surgical History:  Procedure Laterality Date  . CHOLECYSTECTOMY    . COLONOSCOPY W/ POLYPECTOMY    . LUMBAR LAMINECTOMY/DECOMPRESSION MICRODISCECTOMY N/A 12/19/2017   Procedure: LAMINECTOMY LUMBAR TWO- LUMBAR THREE, LUMBAR THREE- LUMBAR FOUR, LUMBAR FOUR- LUMBAR FIVE ;  Surgeon: Consuella Lose, MD;  Location: [facility];  Service: Neurosurgery;  Laterality: N/A;  . NASAL SINUS SURGERY    . TONSILLECTOMY    . WISDOM TOOTH EXTRACTION      FAMILY HISTORY: Family History  Problem Relation Age of Onset  . Cancer Father        stomach cancer  . Migraines Mother     SOCIAL HISTORY: Social History   Socioeconomic  History  . Marital status: Married    Spouse name: Not on file  . Number of children: 1  . Years of education: college  . Highest education level: Not on file  Occupational History  . Occupation: Retired  Tobacco Use  . Smoking status: Never Smoker  . Smokeless tobacco: Never Used  Vaping Use  . Vaping Use: Never used  Substance and Sexual Activity  . Alcohol use: Yes    Alcohol/week: 0.0 standard drinks    Comment: occas.  . Drug use: No  . Sexual activity: Not on file  Other Topics Concern  . Not on file  Social History Narrative   Lives at home, married   Patient does not drink caffeine.   Patient is right handed.   Social Determinants of Health   Financial Resource Strain:   . Difficulty of Paying Living Expenses: Not on file  Food Insecurity:   . Worried About Charity fundraiser in the Last Year: Not on file  . Ran Out of Food in the Last Year: Not on file  Transportation Needs:   . Lack of Transportation (Medical): Not on file  . Lack of Transportation (Non-Medical): Not on file  Physical Activity:   . Days of Exercise per Week: Not on file  . Minutes of Exercise per Session: Not on file  Stress:   . Feeling of Stress : Not on file  Social Connections:   . Frequency of Communication with Friends and Family: Not on file  . Frequency of Social Gatherings with Friends and Family: Not on file  . Attends Religious Services: Not on file  . Active Member of Clubs or Organizations: Not on file  . Attends Archivist Meetings: Not on file  . Marital Status: Not on file  Intimate Partner Violence:   . Fear of Current or Ex-Partner: Not on file  . Emotionally Abused: Not on file  . Physically Abused: Not on file  . Sexually Abused: Not on file   PHYSICAL EXAM  Vitals:  08/30/20 1013  BP: 140/88  Pulse: 72   There is no height or weight on file to calculate BMI.  Generalized: Well developed, in no acute distress   Neurological examination    Mentation: Alert oriented to time, place, history taking. Follows all commands speech and language fluent Cranial nerve II-XII: Pupils were equal round reactive to light. Extraocular movements were full, visual field were full on confrontational test. Facial sensation and strength were normal. Head turning and shoulder shrug  were normal and symmetric. Motor: The motor testing reveals 5 over 5 strength of all 4 extremities. Good symmetric motor tone is noted throughout.  Fairly prominent resting tremor left more than the right.  No significant bradykinesia or rigidity. Sensory: Sensory testing is intact to soft touch on all 4 extremities. No evidence of extinction is noted.  Coordination: Cerebellar testing reveals good finger-nose-finger and heel-to-shin bilaterally.  No tremor with finger-nose-finger. Gait and station: Gait is slightly wide-based, slightly cautious postoperative, bilateral resting tremor noted, more in left than right Reflexes: Deep tendon reflexes are symmetric and normal bilaterally.   DIAGNOSTIC DATA (LABS, IMAGING, TESTING) - I reviewed patient records, labs, notes, testing and imaging myself where available.  Lab Results  Component Value Date   WBC 12.1 (H) 12/20/2017   HGB 13.0 12/20/2017   HCT 39.7 12/20/2017   MCV 93.2 12/20/2017   PLT 243 12/20/2017      Component Value Date/Time   NA 134 (L) 12/20/2017 0403   NA 142 11/01/2015 1232   K 4.6 12/20/2017 0403   CL 97 (L) 12/20/2017 0403   CO2 25 12/20/2017 0403   GLUCOSE 149 (H) 12/20/2017 0403   BUN 17 12/20/2017 0403   BUN 29 (H) 11/01/2015 1232   CREATININE 0.91 12/20/2017 0403   CALCIUM 8.6 (L) 12/20/2017 0403   PROT 6.5 11/01/2015 1232   ALBUMIN 4.5 11/01/2015 1232   AST 28 11/01/2015 1232   ALT 37 (H) 11/01/2015 1232   ALKPHOS 91 11/01/2015 1232   BILITOT 0.3 11/01/2015 1232   GFRNONAA >60 12/20/2017 0403   GFRAA >60 12/20/2017 0403   Lab Results  Component Value Date   CHOL  04/07/2008     166        ATP III CLASSIFICATION:  <200     mg/dL   Desirable  200-239  mg/dL   Borderline High  >=240    mg/dL   High   HDL 64 04/07/2008   LDLCALC  04/07/2008    85        Total Cholesterol/HDL:CHD Risk Coronary Heart Disease Risk Table                     Men   Women  1/2 Average Risk   3.4   3.3   TRIG 86 04/07/2008   CHOLHDL 2.6 04/07/2008   No results found for: HGBA1C Lab Results  Component Value Date   VITAMINB12 338 11/01/2015   Lab Results  Component Value Date   TSH 0.969 11/01/2015   ASSESSMENT AND PLAN 69 y.o. year old female  has a past medical history of Arthritis, Depression, Dyslipidemia, Hypertension, Migraine, Migraine without aura, without mention of intractable migraine without mention of status migrainosus (04/13/2014), Pituitary microadenoma ([place]) (04/13/2014), Pre-diabetes, and Tremor (04/13/2014). here with:  1.  Chronic daily headache -Continue amitriptyline 10-20 mg at bedtime for headache prevention -Continue Imitrex 50 mg as needed for severe headache  2.  Resting tremor, more in the left, but also  present now on the right, possible Parkinson's disease? -I see no other signs of Parkinson's disease on exam, does have a prominent resting tremor bilateral upper extremities -Seems to find propanolol as needed quite helpful actually -Will try propanolol 20 mg BID x 2 weeks, then 40 mg BID for tremor, also may help headache -Call in a few weeks, let me know if the propanolol has been helpful -Follow-up in 6 months or sooner if needed  I spent 30 minutes of face-to-face and non-face-to-face time with patient.  This included previsit chart review, lab review, study review, order entry, electronic health record documentation, patient education.  Butler Denmark, AGNP-C, DNP 08/30/2020, 10:53 AM [facility] [address], [place], [place] [address] [phone]

## 2020-08-31 NOTE — Progress Notes (Signed)
I have read the note, and I agree with the clinical assessment and plan.  Kristin Gilmore Kristin Gilmore Kristin Gilmore   

## 2020-09-05 ENCOUNTER — Other Ambulatory Visit: Payer: Self-pay | Admitting: Specialist

## 2020-09-11 ENCOUNTER — Telehealth: Payer: Self-pay | Admitting: Neurology

## 2020-09-11 ENCOUNTER — Ambulatory Visit: Payer: Medicare Other | Admitting: Physical Therapy

## 2020-09-11 NOTE — Telephone Encounter (Signed)
Pt called, has taken as prescribed  propranolol (INDERAL) 20 MG tablet for 2 weeks. Reporting, it is not working. Would like a call back from the nurse.

## 2020-09-11 NOTE — Telephone Encounter (Signed)
[name] for pt that calling and had she tried the inderal 40mg  po bid.  ??

## 2020-09-12 ENCOUNTER — Telehealth: Payer: Self-pay | Admitting: Specialist

## 2020-09-12 NOTE — Telephone Encounter (Signed)
Pt has called Sandy,RN back.  Please call when available.

## 2020-09-12 NOTE — Telephone Encounter (Signed)
I called pt and she has propranolol 10mg  tablets.  She has been taking 10mg  po bid, for a wk, started 20mg  po bid, will do for a week, then increase to 30mg  po bid for wk then if needed 40mg  po bid.  She stated she let you know (she brought in for appt) that she had this.  I relayed that a prescription was sent to her pharmacy.  She is to call if problems.

## 2020-09-12 NOTE — Telephone Encounter (Signed)
[name] for pt, 2nd call.

## 2020-09-12 NOTE — Telephone Encounter (Signed)
Sent my chart message too.

## 2020-09-12 NOTE — Telephone Encounter (Signed)
Patient called requesting Dr. Louanne Skye send the referral as discussed to another doctor about her hip. Patient states she is having a lot of pains in her hip and would like referral sent right away. Please call patient at [phone].

## 2020-09-14 ENCOUNTER — Other Ambulatory Visit: Payer: Self-pay | Admitting: Radiology

## 2020-09-14 DIAGNOSIS — M25559 Pain in unspecified hip: Secondary | ICD-10-CM

## 2020-09-14 NOTE — Telephone Encounter (Signed)
Order placed for Dr. Ninfa Linden for left hip pain

## 2020-09-18 ENCOUNTER — Ambulatory Visit: Payer: Medicare Other | Admitting: Physician Assistant

## 2020-09-19 ENCOUNTER — Other Ambulatory Visit: Payer: Self-pay | Admitting: Specialist

## 2020-09-20 ENCOUNTER — Ambulatory Visit: Payer: Medicare Other | Admitting: Physical Therapy

## 2020-09-21 ENCOUNTER — Telehealth: Payer: Self-pay | Admitting: Neurology

## 2020-09-21 MED ORDER — DEXAMETHASONE 2 MG PO TABS: ORAL_TABLET | ORAL | 0 refills | Status: DC

## 2020-09-21 NOTE — Telephone Encounter (Signed)
Pt called, having migraines everyday starts about 10a or 11a. Can you prescribe something else. Would like a call from the nurse.

## 2020-09-21 NOTE — Telephone Encounter (Signed)
I have called the patient.  Over the last 2 weeks she has had daily headaches.  She is prone to having episodes such as this.  She claims that in the past she has been on medication such as Aimovig or Ajovy, she does not remember what her response was to the medication.  I will give her a 3-day course of Decadron, she has failed Thorazine.

## 2020-09-21 NOTE — Telephone Encounter (Signed)
Called patient who stated she is taking propranolol 40 mg BID, amitriptyline 10 mg at night (cannot tolerate 20 mg due to dry mouth). She takes sumatriptan or thorazine for breakthrough. Her migraines are daily with little to no relief. I advised Judson Roch is out of office, will send to Dr Jannifer Franklin. Patient verbalized understanding, appreciation.

## 2020-09-27 ENCOUNTER — Telehealth: Payer: Self-pay | Admitting: Neurology

## 2020-09-27 ENCOUNTER — Ambulatory Visit (INDEPENDENT_AMBULATORY_CARE_PROVIDER_SITE_OTHER): Payer: Medicare Other | Admitting: Orthopaedic Surgery

## 2020-09-27 VITALS — Ht 67.5 in | Wt 211.0 lb

## 2020-09-27 DIAGNOSIS — M25559 Pain in unspecified hip: Secondary | ICD-10-CM

## 2020-09-27 DIAGNOSIS — M25552 Pain in left hip: Secondary | ICD-10-CM | POA: Diagnosis not present

## 2020-09-27 DIAGNOSIS — R251 Tremor, unspecified: Secondary | ICD-10-CM

## 2020-09-27 NOTE — Telephone Encounter (Signed)
Pt called today and stated that her recent increase in Propranolol is not helping at all and states she needs to be put on a different  medication. Pt said to call on 12/30 if possible. Best call back is [phone].

## 2020-09-27 NOTE — Progress Notes (Signed)
The patient is a 69 year old female that I am seeing for the first time.  She has been a patient of Dr. Otelia Sergeant in the past.  She has had extensive spine surgery but I her most recent spine surgery was done down in [place].  She shows me the back incision.  She has a well-healed midline incision of the lumbar spine but then she has 4 separate lateral incisions just to the left at several hours of the lumbar spine.  She has notes showing that the spine surgeons in [place] did some foraminotomies another decompressions at several levels.  There is no instrumentation her back.  She comes to me for evaluation of left hip pain but she points to the SI joint in the posterior aspect of her pelvis the source of her pain.  She denies any groin pain.  On my exam her left hip moves smoothly and fluidly with no pain in the groin at all.  I have her lay on her right side with her left hip up and there is no pain to palpation of the trochanteric area or the IT band of the left side.  Again her hip exam is entirely normal.  She does have pain to deep palpation in the posterior pelvis area to the left side.  This certainly could be at the [facility] joint.  I cannot rule out that this is radicular symptoms from something going on with her spine.  I am at least confident this is not a hip issue.  Previous plain films of her pelvis and left hip show normal-appearing left hip joint.  I would like to send her to [facility] imaging for a left-sided SI joint steroid injection.  If she gets no relief from this I recommend she see a spine specialist again.  All questions concerns were answered and addressed.

## 2020-09-28 ENCOUNTER — Other Ambulatory Visit: Payer: Self-pay

## 2020-09-28 DIAGNOSIS — M4316 Spondylolisthesis, lumbar region: Secondary | ICD-10-CM

## 2020-09-28 DIAGNOSIS — M25559 Pain in unspecified hip: Secondary | ICD-10-CM

## 2020-09-28 NOTE — Telephone Encounter (Signed)
LVM informing patient that Maralyn Sago NP will talk to Dr. Anne Hahn about this next week before making another medication suggestion. Tremor is resting in nature. She'd like to run by him first. She will call her next week. Tremor is chronic problem. Left # for further questions.

## 2020-10-02 ENCOUNTER — Ambulatory Visit: Payer: Medicare Other | Admitting: Physical Therapy

## 2020-10-02 ENCOUNTER — Other Ambulatory Visit: Payer: Self-pay | Admitting: Neurology

## 2020-10-02 NOTE — Telephone Encounter (Signed)
Talked with Dr. Anne Hahn, [facility] like to consider Dat Scan if she is willing, determine if this is really PD we are dealing with or essential tremor. Tremor looks consistent with PD.

## 2020-10-02 NOTE — Telephone Encounter (Signed)
I called and [name] for pt to return call about doing testing to PD.  She is to call today prior 2p or tomorrow. DaTscan is a tool used to confirm the diagnosis of Parkinson's disease. It is a specific type of single-photon emission computed tomography (SPECT) imaging technique that helps visualize dopamine transporter levels in the brain.

## 2020-10-03 NOTE — Telephone Encounter (Signed)
Called and spoke to Patient and relayed that I needed her to sign Consent Form  .  Patient relayed she did not want to Have DAT scan done and Hang the telephone up.

## 2020-10-03 NOTE — Addendum Note (Signed)
Addended by: Glean Salvo on: 10/03/2020 01:51 PM   Modules accepted: Orders

## 2020-10-03 NOTE — Telephone Encounter (Signed)
I spoke to pt and she states that she is willing to proceed and have DAT Scan done to determine if PD or essential tremor.  Please order.

## 2020-10-04 ENCOUNTER — Encounter: Payer: Self-pay | Admitting: Neurology

## 2020-10-04 NOTE — Telephone Encounter (Signed)
I spoke to pt previously and she was feeling bad yesterday,  She is interested in doing this test. Consent will be mailed to her by [facility]/ referrals.

## 2020-10-04 NOTE — Telephone Encounter (Signed)
I called her. She is willing to have scan, please send her the consent to sign. Thanks

## 2020-10-04 NOTE — Telephone Encounter (Signed)
I mailed the consent form to the patient.

## 2020-10-04 NOTE — Telephone Encounter (Signed)
I called pt and [name] for her that we spoke yesterday to have DAT scan done is she was willing, this to diag if PD vs essential tremor.  She had said yes, then when called needing consent form she stated that she did not want.  If she had questions then she can call back.

## 2020-10-05 ENCOUNTER — Ambulatory Visit
Admission: RE | Admit: 2020-10-05 | Discharge: 2020-10-05 | Disposition: A | Discharge status: Home / Self Care | Payer: Medicare Other | Source: Ambulatory Visit | Attending: Orthopaedic Surgery | Admitting: Orthopaedic Surgery

## 2020-10-05 ENCOUNTER — Other Ambulatory Visit: Payer: Self-pay

## 2020-10-05 DIAGNOSIS — M4316 Spondylolisthesis, lumbar region: Secondary | ICD-10-CM

## 2020-10-05 DIAGNOSIS — M25559 Pain in unspecified hip: Secondary | ICD-10-CM

## 2020-10-05 IMAGING — XA DG FLUORO GUIDE SPINAL/SI JT INJ*L*
1 series · 1 of 1 positions shown · non-contrast
Comparison: none

CLINICAL DATA: Left-sided pelvic pain. Sacroiliac origin suspected.

[Series 1: ortho adipose · 1 of 1 slices shown]
[im 1/1]
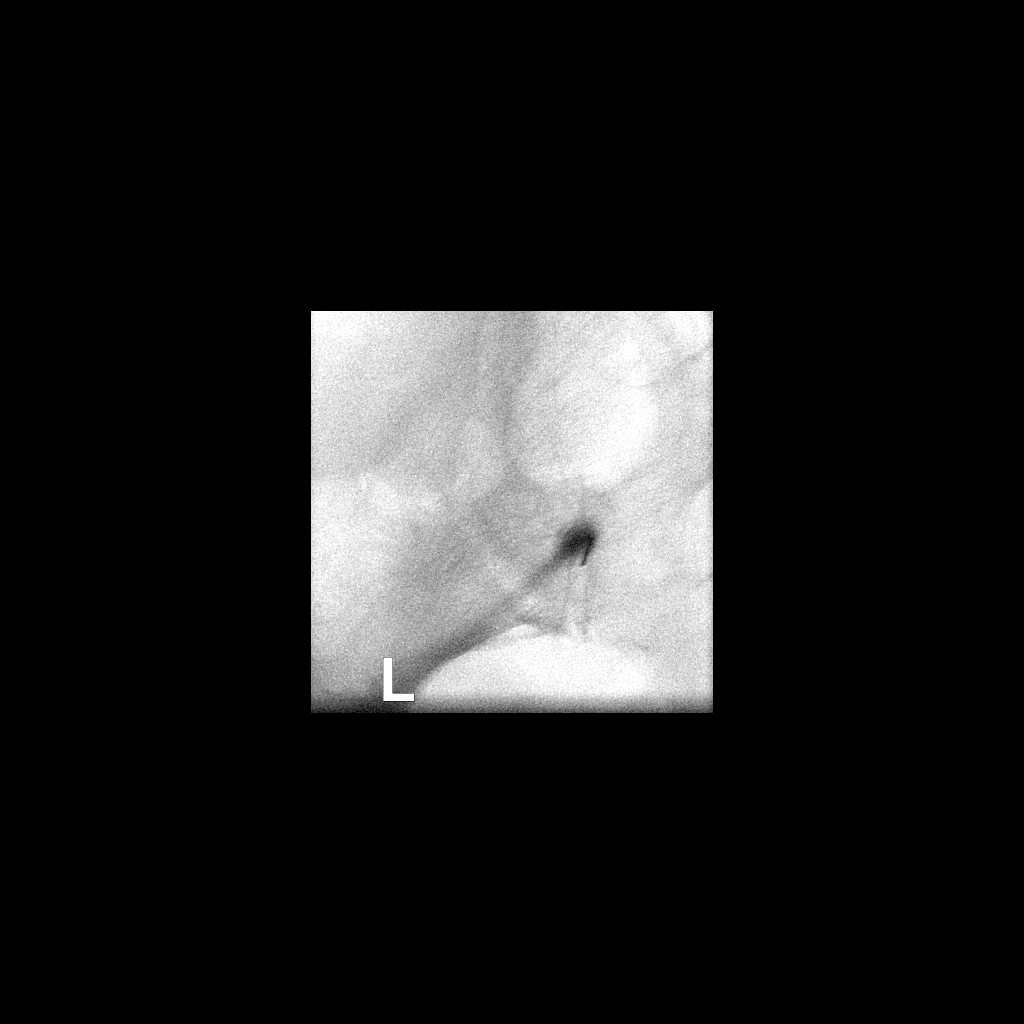

[1 of 1 positions shown; findings below may reference images not displayed]

EXAM:
Left sacroiliac joint injection

FLUOROSCOPY TIME:  Fluoroscopy Time: 0 minutes 42 seconds.
micro gray meter squared

PROCEDURE:
Skin posterior to the left sacroiliac joint was scrubbed with
Betadine and draped in sterile fashion. Under fluoroscopic guidance,
a 22 gauge spinal needle was directed into the left sacroiliac
joint. Intra-articular positioning was confirmed by injecting a few
drops of Isovue 200. 120 mg Depo-Medrol mixed with 1 cc 0.5%
bupivacaine were instilled into the joint. Injection resulted in
concordant pain, according to the patient.
IMPRESSION: Technically successful left sacroiliac joint injection. Concordant
pain experienced by the patient during the injection.

## 2020-10-05 MED ORDER — IOPAMIDOL (ISOVUE-M 200) INJECTION 41%
1.0000 mL | Freq: Once | INTRAMUSCULAR | Status: AC
  Administered 2020-10-05: 1 mL via INTRA_ARTICULAR

## 2020-10-05 MED ORDER — METHYLPREDNISOLONE ACETATE 40 MG/ML INJ SUSP (RADIOLOG
120.0000 mg | Freq: Once | INTRAMUSCULAR | Status: AC
  Administered 2020-10-05: 120 mg via INTRA_ARTICULAR

## 2020-10-05 NOTE — Discharge Instructions (Signed)

## 2020-10-06 ENCOUNTER — Other Ambulatory Visit: Payer: Self-pay | Admitting: Specialist

## 2020-10-09 ENCOUNTER — Telehealth: Payer: Self-pay | Admitting: Neurology

## 2020-10-09 NOTE — Telephone Encounter (Signed)
Pt called, inquiring why refill for chlorproMAZINE (THORAZINE) 50 MG tablet was refused. Would like a call from the nurse.

## 2020-10-10 ENCOUNTER — Telehealth: Payer: Self-pay | Admitting: Specialist

## 2020-10-10 MED ORDER — CHLORPROMAZINE HCL 25 MG PO TABS: 25.0000 mg | ORAL_TABLET | Freq: Two times a day (BID) | ORAL | 0 refills | Status: DC

## 2020-10-10 NOTE — Addendum Note (Signed)
Addended by: Suzzanne Cloud on: 10/10/2020 03:44 PM   Modules accepted: Orders

## 2020-10-10 NOTE — Addendum Note (Signed)
Addended by: Suzzanne Cloud on: 10/10/2020 11:55 AM   Modules accepted: Orders

## 2020-10-10 NOTE — Telephone Encounter (Signed)
Spoke to pt and let her know that prescription called in to [facility]. #6 tablets.

## 2020-10-10 NOTE — Telephone Encounter (Addendum)
I called and [name] for pt to return call.  Notes from 03-24-20 Dr. Jannifer Franklin gave her this for her daily headaches.  Then 09-26-20 noted failed thorazine and gave her decadron.  Pt called back.  She states she takes thorazine when has breakthru headaches that are not so severe (takes the sumatriptan when has severe headaches).

## 2020-10-10 NOTE — Telephone Encounter (Signed)
Pt called and the shot she got by Dr.blackman isn't working and she was wondering where does she go from here? What does she need to do. Please call her at [phone]

## 2020-10-10 NOTE — Telephone Encounter (Signed)
I will send in Thorazine 25 mg tablet, 1 tablet twice daily for 3 days. Previously she reported sleepiness on the 50 mg, but does look like she received that since. Is this correct before I send the rx.

## 2020-10-11 NOTE — Telephone Encounter (Signed)
Can you ask Dr. Ninfa Linden about this?

## 2020-10-11 NOTE — Telephone Encounter (Signed)
Pt is asking for a call from RN to discuss why she was only called in 6 pills.  Please call

## 2020-10-11 NOTE — Telephone Encounter (Signed)
There is really nothing that I can recommend.  Dr. Louanne Skye sent her my way to evaluate her hip and hip exam and x-rays are normal.  She was having SI joint related pain and we sent her for an injection in the SI joint.  I feel that this is more likely at this point a spine issue and she needs to see her spine specialist.  She had recently had extensive noninvasive spine surgery in [place].  From a hip standpoint, there is really nothing for me to recommend.

## 2020-10-12 ENCOUNTER — Other Ambulatory Visit: Payer: Self-pay

## 2020-10-12 DIAGNOSIS — M4316 Spondylolisthesis, lumbar region: Secondary | ICD-10-CM

## 2020-10-12 DIAGNOSIS — M48062 Spinal stenosis, lumbar region with neurogenic claudication: Secondary | ICD-10-CM

## 2020-10-12 DIAGNOSIS — M5136 Other intervertebral disc degeneration, lumbar region: Secondary | ICD-10-CM

## 2020-10-12 NOTE — Telephone Encounter (Signed)
Referral sent Patient aware they will call her with an appt

## 2020-10-12 NOTE — Telephone Encounter (Signed)
Pt called, c/o about being called at 0756 in the am, also has not gotten her DAT consent in mail yet, why she only got thorazine 6 tabs when last time she got 29??  She states she get headaches every 2-3 days.  She cant go to the pharmacy every several days, her husband has PD.  She is running fever and is not feeling well, is seeing pcp.  I relayed that mail service could be delayed.  Thorazine to be taken only as needed is not daily medication.

## 2020-10-12 NOTE — Telephone Encounter (Signed)
Agree with assessment, the Thorazine is for a rescue drug, and is not to be taken on a daily basis.

## 2020-10-12 NOTE — Telephone Encounter (Signed)
I called the patient, she got her 6 tablets of thorazine to be taken to break cycle of headache, when mild to moderate (is how she doses it). Takes Imitrex when its really bad, this always helps. I am not sure she needs more Thorazine at this time, we talked about only treating significant headaches, potential for rebound headache otherwise. We kept things as it for now.  She never got the paperwork to sign for Dat Scan, will forward this to [place].

## 2020-10-12 NOTE — Telephone Encounter (Signed)
I called and [name] for pt that looks like 50mg  tab caused her drowsiness, and was ordered like this previously.  Hopefully to break cycle.  Take 1 tablet po bid for 3 days.  If she has anymore questions please return call.

## 2020-10-16 ENCOUNTER — Ambulatory Visit: Payer: Medicare Other | Admitting: Physical Therapy

## 2020-10-23 DIAGNOSIS — E1141 Type 2 diabetes mellitus with diabetic mononeuropathy: Secondary | ICD-10-CM | POA: Insufficient documentation

## 2020-10-23 NOTE — Telephone Encounter (Signed)
Kristin Gilmore you did [place] I had to call patient and get her to sign another Consent .

## 2020-10-24 ENCOUNTER — Other Ambulatory Visit: Payer: Self-pay

## 2020-10-24 ENCOUNTER — Ambulatory Visit (INDEPENDENT_AMBULATORY_CARE_PROVIDER_SITE_OTHER): Payer: Medicare Other | Admitting: Physical Therapy

## 2020-10-24 DIAGNOSIS — G8929 Other chronic pain: Secondary | ICD-10-CM

## 2020-10-24 DIAGNOSIS — M25671 Stiffness of right ankle, not elsewhere classified: Secondary | ICD-10-CM

## 2020-10-24 DIAGNOSIS — M545 Low back pain, unspecified: Secondary | ICD-10-CM

## 2020-10-24 DIAGNOSIS — M25571 Pain in right ankle and joints of right foot: Secondary | ICD-10-CM

## 2020-10-24 DIAGNOSIS — R2689 Other abnormalities of gait and mobility: Secondary | ICD-10-CM

## 2020-10-24 DIAGNOSIS — R262 Difficulty in walking, not elsewhere classified: Secondary | ICD-10-CM

## 2020-10-24 DIAGNOSIS — M6281 Muscle weakness (generalized): Secondary | ICD-10-CM | POA: Diagnosis not present

## 2020-10-24 DIAGNOSIS — M25552 Pain in left hip: Secondary | ICD-10-CM

## 2020-10-24 NOTE — Therapy (Signed)
[place], [place], [address] Phone: [phone]   Fax:  [phone]  Physical Therapy Evaluation  Patient Details  Name: Kristin Gilmore MRN: [id number] Date of Birth: 09-04-1951 Referring Provider (PT): Jean Rosenthal MD   Encounter Date: 10/24/2020   PT End of Session - 10/24/20 1209    Visit Number 1    Number of Visits 12    Date for PT Re-Evaluation 12/15/20    Authorization Type Medicare    Progress Note Due on Visit 10    PT Start Time [id number]    PT Stop Time 1225    PT Time Calculation (min) 40 min    Activity Tolerance Patient tolerated treatment well    Behavior During Therapy [facility] for tasks assessed/performed           Past Medical History:  Diagnosis Date  . Arthritis   . Depression   . Dyslipidemia   . Hypertension   . Migraine   . Migraine without aura, without mention of intractable migraine without mention of status migrainosus 04/13/2014  . Pituitary microadenoma ([place]) 04/13/2014  . Pre-diabetes    "i was told i was pre-diabetic a while ago"  . Tremor 04/13/2014    Past Surgical History:  Procedure Laterality Date  . CHOLECYSTECTOMY    . COLONOSCOPY W/ POLYPECTOMY    . LUMBAR LAMINECTOMY/DECOMPRESSION MICRODISCECTOMY N/A 12/19/2017   Procedure: LAMINECTOMY LUMBAR TWO- LUMBAR THREE, LUMBAR THREE- LUMBAR FOUR, LUMBAR FOUR- LUMBAR FIVE ;  Surgeon: Consuella Lose, MD;  Location: [place];  Service: Neurosurgery;  Laterality: N/A;  . NASAL SINUS SURGERY    . TONSILLECTOMY    . WISDOM TOOTH EXTRACTION      There were no vitals filed for this visit.    Subjective Assessment - 10/24/20 1159    Subjective Pt arriving to therapy s/p L2-3, L3-4, L4-5 laminectomy, facetectomy, foraminotomy. Pt reporting L hip pain has been ongoing for years. Pt amb with rolling walker for amb at home. Pt arriving today in wheelchair. Pt reporting her pain is 0/10 when sitting, pain increases to 6/10 with  standing or transfers.    Pertinent History s/p L2-3, L3-4, L4-5 laminectomy, facetectomy, foraminotomy surgeries performed on 07/31/2020,  08/01/2020, 08/02/2020, and 08/03/2020. essential tremors    How long can you sit comfortably? 2 hours    How long can you walk comfortably? It hurts to walk    Diagnostic tests MRI    Patient Stated Goals Stop hurting    Currently in Pain? Yes    Pain Score 6     Pain Location Hip    Pain Orientation Left    Pain Descriptors / Indicators Sharp    Pain Type Chronic pain    Pain Onset More than a month ago    Pain Frequency Intermittent    Aggravating Factors  walking, standing    Pain Relieving Factors sitting    Effect of Pain on Daily Activities difficutly with ADL's              [facility] PT Assessment - 10/24/20 0001      Assessment   Medical Diagnosis M25.559 L hip pain    Referring Provider (PT) Jean Rosenthal MD    Hand Dominance Right    Prior Therapy yes before for back and hip      Precautions   Precautions None      Restrictions   Weight Bearing Restrictions No      Balance Screen  Has the patient fallen in the past 6 months No    Is the patient reluctant to leave their home because of a fear of falling?  No      Home Social worker Private residence    Home Access Stairs to enter    Entrance Stairs-Number of Steps 2    Entrance Stairs-Rails Can reach both    [place] Two level;Able to live on main level with bedroom/bathroom    Additional Comments pt has a lift to go upstairs      Prior Function   Level of Independence Independent with basic ADLs    Vocation Retired    Leisure Oceanographer, reading, crochet      Cognition   Overall Cognitive Status Within Functional Limits for tasks assessed      Observation/Other Assessments   Focus on Therapeutic Outcomes (FOTO)  35% function (predicted 45%)      Posture/Postural Control   Posture/Postural Control Postural limitations    Postural  Limitations Rounded Shoulders;Forward head;Increased lumbar lordosis      ROM / Strength   AROM / PROM / Strength Strength      Strength   Overall Strength Deficits    Strength Assessment Site Hip;Knee    Right/Left Hip Right;Left    Right Hip Flexion 4/5    Right Hip ABduction 4/5    Right Hip ADduction 4/5    Left Hip Flexion 3+/5    Left Hip ABduction 3/5    Left Hip ADduction 3/5    Right/Left Knee Right;Left    Right Knee Flexion 4/5    Right Knee Extension 4/5    Left Knee Flexion 4/5    Left Knee Extension 4/5      Palpation   Palpation comment TTP lateral L hip, L gluteals and L piriformis      Transfers   Five time sit to stand comments  28 seconds using UE support and decreased hip extension.      Ambulation/Gait   Gait Comments amb with forward flexed posture with decreased step length and foot clearance with no device, pt reporting she using a rolling walker for longer distnaces                      Objective measurements completed on examination: See above findings.               PT Education - 10/24/20 1207    Education Details PT POC, outcome    Person(s) Educated Patient    Methods Explanation;Handout;Demonstration;Verbal cues;Tactile cues    Comprehension Verbalized understanding;Returned demonstration;Need further instruction            PT Short Term Goals - 10/24/20 1210      PT SHORT TERM GOAL #1   Title I with initial HEP.    Time 3    Period Weeks    Status New    Target Date 11/17/20      PT SHORT TERM GOAL #2   Title -             PT Long Term Goals - 10/24/20 1210      PT LONG TERM GOAL #1   Title Patient will demonstrate/report pain at worst less than or equal to 2/10 to facilitate minimal limitation in daily activity secondary to pain symptoms.    Time 6    Period Weeks    Status New      PT LONG  TERM GOAL #2   Title Patient will demonstrate independent use of home exercise program to facilitate  ability to maintain/progress functional gains from skilled physical therapy services.    Time 6    Period Weeks    Status New    Target Date 12/08/20      PT LONG TERM GOAL #3   Title Patient will demonstrate LRAD ambulation community distances > 300 ft to facilitate community integration at [facility].    Time 6    Period Weeks    Status New      PT LONG TERM GOAL #4   Title Pt will improve her 5 time  sit to stand to less than 20 seconds in order to improve functinoal mobiltiy.    Time 6    Period Weeks    Status New      PT LONG TERM GOAL #5   Title -                  Plan - 10/24/20 1322    Clinical Impression Statement Pt presenting today for PT evalution of her left hip pain. Pt reporting pain 0/10 at rest and 7/10 with walking. Pt reporting her pain can vary. Pt with recent lumbar laminectomy surgeries x 4 L2-3, L3-4, L4-5 07/31/2020, 08/01/2020, 08/02/2020, 08/03/2020. Pt also presenting with essential tremors and following up with neurologist. Pt presenting with weakness in bilateral hips of 4/5 grossly in R hip and 3+/5 in L hip limited by pain with abduction and adduction. Pt tender to palpation lateral hip joint with pain radiating into gluteals and left piriformis. Pt ambs with rolling walker for longer distances. Arriving today in wheelchiar. Pt's husband has Parkinson's Disease and he is limited caregiver for pt.  Skilled PT needed to address pt's impairments with below interventions.    Personal Factors and Comorbidities Comorbidity 3+    Comorbidities lumbar laminectomy surgeries x 4 L2-3, L3-4, L4-5 07/31/2020, 08/01/2020, 08/02/2020, 08/03/2020. essential tremors, depression, anxiety, HTN, migraines    Examination-Activity Limitations Stand;Lift;Stairs;Sleep;Transfers    Examination-Participation Restrictions Community Activity;Driving;Other    Stability/Clinical Decision Making Evolving/Moderate complexity    Clinical Decision Making Moderate    Rehab Potential Fair     PT Frequency 2x / week    PT Duration 6 weeks    PT Treatment/Interventions ADLs/Self Care Home Management;Cryotherapy;Electrical Stimulation;Moist Heat;Gait training;Stair training;Functional mobility training;Therapeutic activities;Patient/family education;Neuromuscular re-education;Therapeutic exercise;Balance training;Manual techniques;Passive range of motion;Dry needling;Taping    PT Next Visit Plan Nustep, hip strengthening, posture correction, core strengthening    PT Home Exercise Plan [id number]    Consulted and Agree with Plan of Care Patient           Patient will benefit from skilled therapeutic intervention in order to improve the following deficits and impairments:  Obesity,Difficulty walking,Impaired flexibility,Decreased activity tolerance,Decreased range of motion,Decreased strength,Postural dysfunction,Pain,Decreased mobility  Visit Diagnosis: Chronic low back pain, unspecified back pain laterality, unspecified whether sciatica present  Muscle weakness (generalized)  Difficulty in walking, not elsewhere classified  Pain in right ankle and joints of right foot  Stiffness of right ankle, not elsewhere classified  Other abnormalities of gait and mobility     Problem List Patient Active Problem List   Diagnosis Date Noted  . Trochanteric bursitis, left hip 01/18/2020  . Lumbar spinal stenosis 12/19/2017  . Pain in joint, ankle and foot 05/02/2016  . Chronic venous insufficiency 05/02/2016  . Peripheral edema 11/01/2015  . Intractable chronic migraine without aura 11/02/2014  . Tremor 04/13/2014  .  Migraine without aura 04/13/2014  . 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 [place], [place], [address] Phone: [phone]   Fax:  [phone]  Name: Kristin Gilmore MRN: [id number] Date of Birth: [birth year]

## 2020-10-24 NOTE — Patient Instructions (Signed)
Access Code: [email or web address] URL: [email or web address] Date: 10/24/2020 Prepared by: Kearney Hard  Exercises Supine March - 2-3 x daily - 7 x weekly - 2 sets - 10 reps Seated March - 2-3 x daily - 7 x weekly - 2 sets - 10 reps Bent Knee Fallouts - 2-3 x daily - 7 x weekly - 2 sets - 10 reps Supine Hip Adduction Isometric with Ball - 2-3 x daily - 7 x weekly - 2 sets - 10 reps Seated Hip Adduction Isometrics with Ball - 2-3 x daily - 7 x weekly - 2 sets - 10 reps Sit to Stand with Armchair - 2-3 x daily - 7 x weekly - 10 reps

## 2020-10-25 ENCOUNTER — Telehealth: Payer: Self-pay | Admitting: Neurology

## 2020-10-25 NOTE — Telephone Encounter (Signed)
Audrie Gallus sending you DAT scan paper work to you for re signature . Thanks Hinton Dyer

## 2020-10-30 ENCOUNTER — Encounter: Payer: Self-pay | Admitting: Physical Therapy

## 2020-10-30 ENCOUNTER — Ambulatory Visit (INDEPENDENT_AMBULATORY_CARE_PROVIDER_SITE_OTHER): Payer: Medicare Other | Admitting: Physical Therapy

## 2020-10-30 ENCOUNTER — Other Ambulatory Visit: Payer: Self-pay

## 2020-10-30 DIAGNOSIS — M545 Low back pain, unspecified: Secondary | ICD-10-CM | POA: Diagnosis not present

## 2020-10-30 DIAGNOSIS — R262 Difficulty in walking, not elsewhere classified: Secondary | ICD-10-CM

## 2020-10-30 DIAGNOSIS — M6281 Muscle weakness (generalized): Secondary | ICD-10-CM

## 2020-10-30 DIAGNOSIS — G8929 Other chronic pain: Secondary | ICD-10-CM

## 2020-10-30 DIAGNOSIS — M25552 Pain in left hip: Secondary | ICD-10-CM | POA: Diagnosis not present

## 2020-10-30 NOTE — Therapy (Signed)
[facility] [address] [place], [place], [address] Phone: [phone]   Fax:  [phone]  Physical Therapy Treatment  Patient Details  Name: Kristin Gilmore [place] MRN: [id number] Date of Birth: 1951/03/09 Referring Provider (PT): Jean Rosenthal MD   Encounter Date: 10/30/2020   PT End of Session - 10/30/20 1535    Visit Number 2    Number of Visits 12    Date for PT Re-Evaluation 12/15/20    Authorization Type Medicare    Progress Note Due on Visit 10    PT Start Time 1520    PT Stop Time 1600    PT Time Calculation (min) 40 min    Activity Tolerance Patient tolerated treatment well    Behavior During Therapy [facility] for tasks assessed/performed           Past Medical History:  Diagnosis Date  . Arthritis   . Depression   . Dyslipidemia   . Hypertension   . Migraine   . Migraine without aura, without mention of intractable migraine without mention of status migrainosus 04/13/2014  . 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 Pre-diabetes    "i was told i was pre-diabetic a while ago"  . Tremor 04/13/2014    Past Surgical History:  Procedure Laterality Date  . CHOLECYSTECTOMY    . COLONOSCOPY W/ POLYPECTOMY    . LUMBAR LAMINECTOMY/DECOMPRESSION MICRODISCECTOMY N/A 12/19/2017   Procedure: LAMINECTOMY LUMBAR TWO- LUMBAR THREE, LUMBAR THREE- LUMBAR FOUR, LUMBAR FOUR- LUMBAR FIVE ;  Surgeon: Consuella Lose, MD;  Location: [place];  Service: Neurosurgery;  Laterality: N/A;  . NASAL SINUS SURGERY    . TONSILLECTOMY    . WISDOM TOOTH EXTRACTION      There were no vitals filed for this visit.   Subjective Assessment - 10/30/20 1532    Subjective Pt arriving to therpay reporting no pain at rest. Pt reporting 6-7/10 with walking in her left hip. Pt reporting compliance with her HEP.    Pertinent History s/p L2-3, L3-4, L4-5 laminectomy, facetectomy, foraminotomy surgeries performed on 07/31/2020,  08/01/2020, 08/02/2020, and  08/03/2020. essential tremors    How long can you sit comfortably? 2 hours    How long can you walk comfortably? It hurts to walk    Diagnostic tests MRI    Patient Stated Goals Stop hurting    Currently in Pain? Yes    Pain Score 7    with walking, no pain sitting   Pain Location Hip    Pain Orientation Left    Pain Descriptors / Indicators Aching;Sore;Sharp    Pain Type Chronic pain    Pain Onset More than a month ago    Pain Frequency Intermittent              [facility] PT Assessment - 10/30/20 0001      Assessment   Medical Diagnosis M25.559 L hip pain    Referring Provider (PT) Jean Rosenthal MD    Hand Dominance Right    Prior Therapy yes before for back and hip      Precautions   Precautions None      Restrictions   Weight Bearing Restrictions No                         [facility] Adult PT Treatment/Exercise - 10/30/20 0001      Exercises   Exercises Knee/Hip      Knee/Hip Exercises: Stretches   Piriformis Stretch 3 reps;30 seconds  Knee/Hip Exercises: Standing   Heel Raises Both      Knee/Hip Exercises: Seated   Long Arc Quad Strengthening;Both;10 reps    Marching Strengthening;20 reps      Knee/Hip Exercises: Supine   Writer;Both    Straight Leg Raises Strengthening;10 reps    Other Supine Knee/Hip Exercises hip abduction in clam shell position x 10 holding open 3 seconds each    Other Supine Knee/Hip Exercises trunk rotation x 5 hodling 10 seconds each                    PT Short Term Goals - 10/24/20 1210      PT SHORT TERM GOAL #1   Title I with initial HEP.    Time 3    Period Weeks    Status New    Target Date 11/17/20      PT SHORT TERM GOAL #2   Title -             PT Long Term Goals - 10/24/20 1210      PT LONG TERM GOAL #1   Title Patient will demonstrate/report pain at worst less than or equal to 2/10 to facilitate minimal limitation in daily activity secondary to pain symptoms.     Time 6    Period Weeks    Status New      PT LONG TERM GOAL #2   Title Patient will demonstrate independent use of home exercise program to facilitate ability to maintain/progress functional gains from skilled physical therapy services.    Time 6    Period Weeks    Status New    Target Date 12/08/20      PT LONG TERM GOAL #3   Title Patient will demonstrate LRAD ambulation community distances > 300 ft to facilitate community integration at [facility].    Time 6    Period Weeks    Status New      PT LONG TERM GOAL #4   Title Pt will improve her 5 time  sit to stand to less than 20 seconds in order to improve functinoal mobiltiy.    Time 6    Period Weeks    Status New      PT LONG TERM GOAL #5   Title -                 Plan - 10/30/20 1541    Clinical Impression Statement Pt arriving to therapy in wheelchair and  reporting 6-7/10 with walking and no pain at rest.. Pt tolerating her exerises well with rest breaks between each exercise. Standing exercises began today with close contract guard assist. I added standing heel raises, hip abduction and hip extension exercises to pt's HEP with handout issued. Continue skilled PT.    Personal Factors and Comorbidities Comorbidity 3+    Comorbidities lumbar laminectomy surgeries x 4 L2-3, L3-4, L4-5 07/31/2020, 08/01/2020, 08/02/2020, 08/03/2020. essential tremors, depression, anxiety, HTN, migraines    Examination-Activity Limitations Stand;Lift;Stairs;Sleep;Transfers    Examination-Participation Restrictions Community Activity;Driving;Other    Stability/Clinical Decision Making Evolving/Moderate complexity    Rehab Potential Fair    PT Frequency 2x / week    PT Duration 6 weeks    PT Treatment/Interventions ADLs/Self Care Home Management;Cryotherapy;Electrical Stimulation;Moist Heat;Gait training;Stair training;Functional mobility training;Therapeutic activities;Patient/family education;Neuromuscular re-education;Therapeutic  exercise;Balance training;Manual techniques;Passive range of motion;Dry needling;Taping    PT Next Visit Plan Nustep, hip strengthening, posture correction, core strengthening    PT Home Exercise Plan [email or web address]  Consulted and Agree with Plan of Care Patient           Patient will benefit from skilled therapeutic intervention in order to improve the following deficits and impairments:     Visit Diagnosis: Chronic low back pain, unspecified back pain laterality, unspecified whether sciatica present  Pain in left hip  Muscle weakness (generalized)  Difficulty in walking, not elsewhere classified     Problem List Patient Active Problem List   Diagnosis Date Noted  . Trochanteric bursitis, left hip 01/18/2020  . Lumbar spinal stenosis 12/19/2017  . Pain in joint, ankle and foot 05/02/2016  . Chronic venous insufficiency 05/02/2016  . Peripheral edema 11/01/2015  . Intractable chronic migraine without aura 11/02/2014  . Tremor 04/13/2014  . Migraine without aura 04/13/2014  . 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 [place], [place], [address] Phone: [phone]   Fax:  [phone]  Name: BITANIA MATUSEK MRN: [id number] Date of Birth: 07-18-1951

## 2020-11-06 NOTE — Telephone Encounter (Signed)
11/06/2020  DAT Scan approved Called and left Patient a message . Will send Lovena Le approval [place] Via Email  Lovena Le Will call Patient to Sch. Thanks Hinton Dyer [phone]

## 2020-11-07 ENCOUNTER — Encounter: Payer: Self-pay | Admitting: Physical Therapy

## 2020-11-07 ENCOUNTER — Other Ambulatory Visit: Payer: Self-pay

## 2020-11-07 ENCOUNTER — Ambulatory Visit (INDEPENDENT_AMBULATORY_CARE_PROVIDER_SITE_OTHER): Payer: Medicare Other | Admitting: Physical Therapy

## 2020-11-07 DIAGNOSIS — M545 Low back pain, unspecified: Secondary | ICD-10-CM | POA: Diagnosis not present

## 2020-11-07 DIAGNOSIS — R262 Difficulty in walking, not elsewhere classified: Secondary | ICD-10-CM | POA: Diagnosis not present

## 2020-11-07 DIAGNOSIS — M6281 Muscle weakness (generalized): Secondary | ICD-10-CM | POA: Diagnosis not present

## 2020-11-07 DIAGNOSIS — M25552 Pain in left hip: Secondary | ICD-10-CM

## 2020-11-07 DIAGNOSIS — R2689 Other abnormalities of gait and mobility: Secondary | ICD-10-CM

## 2020-11-07 DIAGNOSIS — G8929 Other chronic pain: Secondary | ICD-10-CM

## 2020-11-07 NOTE — Patient Instructions (Signed)
Access Code: [email or web address] URL: [email or web address] Date: 11/07/2020 Prepared by: Faustino Congress  Exercises Supine March - 2-3 x daily - 7 x weekly - 2 sets - 10 reps Seated March - 2-3 x daily - 7 x weekly - 2 sets - 10 reps Bent Knee Fallouts - 2-3 x daily - 7 x weekly - 2 sets - 10 reps Supine Hip Adduction Isometric with Ball - 2-3 x daily - 7 x weekly - 2 sets - 10 reps Seated Hip Adduction Isometrics with Ball - 2-3 x daily - 7 x weekly - 2 sets - 10 reps Sit to Stand with Armchair - 2-3 x daily - 7 x weekly - 10 reps Heel rises with counter support - 2 x daily - 7 x weekly - 10 reps Standing Hip Abduction with Counter Support - 2 x daily - 7 x weekly - 10 reps Standing Hip Extension with Counter Support - 2 x daily - 7 x weekly - 10 reps Side to Side Weight Shift with Overhead Reach and Counter Support - 2 x daily - 7 x weekly - 1-2 sets - 10 reps Seated Bilateral Shoulder External Rotation with Resistance - 2 x daily - 7 x weekly - 1-2 sets - 10 reps Standing Row with Anchored Resistance - 2 x daily - 7 x weekly - 1-2 sets - 10 reps

## 2020-11-07 NOTE — Therapy (Signed)
[facility] [address] [place], [place], [address] Phone: [phone]   Fax:  [phone]  Physical Therapy Treatment  Patient Details  Name: Kristin Gilmore MRN: [id number] Date of Birth: 15-Nov-1950 Referring Provider (PT): Jean Rosenthal MD   Encounter Date: 11/07/2020   PT End of Session - 11/07/20 1355    Visit Number 3    Number of Visits 12    Date for PT Re-Evaluation 12/15/20    Authorization Type Medicare    Progress Note Due on Visit 10    PT Start Time 9675    PT Stop Time 1345    PT Time Calculation (min) 40 min    Activity Tolerance Patient tolerated treatment well    Behavior During Therapy [facility] for tasks assessed/performed           Past Medical History:  Diagnosis Date  . Arthritis   . Depression   . Dyslipidemia   . Hypertension   . Migraine   . Migraine without aura, without mention of intractable migraine without mention of status migrainosus 04/13/2014  . Pituitary microadenoma ([place]) 04/13/2014  . Pre-diabetes    "i was told i was pre-diabetic a while ago"  . Tremor 04/13/2014    Past Surgical History:  Procedure Laterality Date  . CHOLECYSTECTOMY    . COLONOSCOPY W/ POLYPECTOMY    . LUMBAR LAMINECTOMY/DECOMPRESSION MICRODISCECTOMY N/A 12/19/2017   Procedure: LAMINECTOMY LUMBAR TWO- LUMBAR THREE, LUMBAR THREE- LUMBAR FOUR, LUMBAR FOUR- LUMBAR FIVE ;  Surgeon: Consuella Lose, MD;  Location: [facility];  Service: Neurosurgery;  Laterality: N/A;  . NASAL SINUS SURGERY    . TONSILLECTOMY    . WISDOM TOOTH EXTRACTION      There were no vitals filed for this visit.   Subjective Assessment - 11/07/20 1307    Subjective had some questions about her HEP    Pertinent History s/p L2-3, L3-4, L4-5 laminectomy, facetectomy, foraminotomy surgeries performed on 07/31/2020,  08/01/2020, 08/02/2020, and 08/03/2020. essential tremors    How long can you sit comfortably? 2 hours    How long can you walk comfortably?  It hurts to walk    Diagnostic tests MRI    Patient Stated Goals Stop hurting    Currently in Pain? No/denies    Pain Onset More than a month ago                             [facility] Adult PT Treatment/Exercise - 11/07/20 1311      Knee/Hip Exercises: Standing   Heel Raises Both;20 reps    Hip Abduction Both;10 reps    Hip Extension Both;10 reps    Other Standing Knee Exercises scap retraction L4 band 2x10    Other Standing Knee Exercises lateral weight shifting with overhead reach x 10 bil      Knee/Hip Exercises: Seated   Long Arc Quad Strengthening;Both;20 reps    Other Seated Knee/Hip Exercises seated shoulder er 2x10 with red theraband for posture and back strengthening    Sit to Sand 10 reps;without UE support   with overhead reach with ball                 PT Education - 11/07/20 1355    Education Details added to HEP    Person(s) Educated Patient    Methods Explanation;Demonstration;Handout    Comprehension Verbalized understanding;Returned demonstration            PT Short  Term Goals - 11/07/20 1355      PT SHORT TERM GOAL #1   Title I with initial HEP.    Baseline 2/8: added today    Time 3    Period Weeks    Status On-going    Target Date 11/17/20      PT SHORT TERM GOAL #2   Title -      PT SHORT TERM GOAL #3   Title n/a             PT Long Term Goals - 11/07/20 1356      PT LONG TERM GOAL #1   Title Patient will demonstrate/report pain at worst less than or equal to 2/10 to facilitate minimal limitation in daily activity secondary to pain symptoms.    Time 6    Period Weeks    Status On-going      PT LONG TERM GOAL #2   Title Patient will demonstrate independent use of home exercise program to facilitate ability to maintain/progress functional gains from skilled physical therapy services.    Time 6    Period Weeks    Status On-going      PT LONG TERM GOAL #3   Title Patient will demonstrate LRAD ambulation  community distances > 300 ft to facilitate community integration at [facility].    Time 6    Period Weeks    Status On-going      PT LONG TERM GOAL #4   Title Pt will improve her 5 time  sit to stand to less than 20 seconds in order to improve functinoal mobiltiy.    Time 6    Period Weeks    Status On-going      PT LONG TERM GOAL #5   Title -                 Plan - 11/07/20 1356    Clinical Impression Statement Pt tolerated session well today needing rest breaks between exercises due to decreased endurance and strength.  Pt needs to increase activity gradually to improve endurance and strength, and will continue to benefit from PT to maximize function.  Discussed bringing RW to session to walk into therapy sessions rather than requesting a ride.    Personal Factors and Comorbidities Comorbidity 3+    Comorbidities lumbar laminectomy surgeries x 4 L2-3, L3-4, L4-5 07/31/2020, 08/01/2020, 08/02/2020, 08/03/2020. essential tremors, depression, anxiety, HTN, migraines    Examination-Activity Limitations Stand;Lift;Stairs;Sleep;Transfers    Examination-Participation Restrictions Community Activity;Driving;Other    Stability/Clinical Decision Making Evolving/Moderate complexity    Rehab Potential Fair    PT Frequency 2x / week    PT Duration 6 weeks    PT Treatment/Interventions ADLs/Self Care Home Management;Cryotherapy;Electrical Stimulation;Moist Heat;Gait training;Stair training;Functional mobility training;Therapeutic activities;Patient/family education;Neuromuscular re-education;Therapeutic exercise;Balance training;Manual techniques;Passive range of motion;Dry needling;Taping    PT Next Visit Plan Nustep, hip strengthening, posture correction, core strengthening, generalized endurance and strengthening    PT Home Exercise Plan [id number]    Consulted and Agree with Plan of Care Patient           Patient will benefit from skilled therapeutic intervention in order to improve the  following deficits and impairments:     Visit Diagnosis: Pain in left hip  Chronic low back pain, unspecified back pain laterality, unspecified whether sciatica present  Muscle weakness (generalized)  Difficulty in walking, not elsewhere classified  Other abnormalities of gait and mobility     Problem List Patient Active Problem List  Diagnosis Date Noted  . Trochanteric bursitis, left hip 01/18/2020  . Lumbar spinal stenosis 12/19/2017  . Pain in joint, ankle and foot 05/02/2016  . Chronic venous insufficiency 05/02/2016  . Peripheral edema 11/01/2015  . Intractable chronic migraine without aura 11/02/2014  . Tremor 04/13/2014  . Migraine without aura 04/13/2014  . Pituitary microadenoma ([place]) 04/13/2014     Laureen Abrahams, PT, DPT 11/07/20 1:58 PM   [facility] [address] [place], [place], [address] Phone: [phone]   Fax:  [phone]  Name: Kristin Gilmore MRN: [id number] Date of Birth: May 01, 1951

## 2020-11-08 ENCOUNTER — Encounter: Payer: Medicare Other | Admitting: Physical Therapy

## 2020-11-09 ENCOUNTER — Telehealth: Payer: Self-pay

## 2020-11-09 NOTE — Telephone Encounter (Signed)
Pt was calling just to let us know she isnt any better. I let her know that a referral was placed so hopefully she will be called soon to schedule. She stated understanding to plan

## 2020-11-09 NOTE — Telephone Encounter (Signed)
Pt was referred to [facility] neuro

## 2020-11-09 NOTE — Telephone Encounter (Signed)
Patient would like a call back today.  Did not state what the problem/issue she is having.  CB# [phone].  Please advise.  Thank you.

## 2020-11-13 ENCOUNTER — Encounter: Payer: Medicare Other | Admitting: Physical Therapy

## 2020-11-15 ENCOUNTER — Encounter: Payer: Medicare Other | Admitting: Physical Therapy

## 2020-11-17 ENCOUNTER — Other Ambulatory Visit: Payer: Self-pay | Admitting: Gastroenterology

## 2020-11-17 DIAGNOSIS — R102 Pelvic and perineal pain: Secondary | ICD-10-CM

## 2020-11-20 ENCOUNTER — Encounter: Payer: Medicare Other | Admitting: Physical Therapy

## 2020-11-22 ENCOUNTER — Other Ambulatory Visit: Payer: Self-pay

## 2020-11-22 ENCOUNTER — Ambulatory Visit (INDEPENDENT_AMBULATORY_CARE_PROVIDER_SITE_OTHER): Payer: Medicare Other | Admitting: Physical Therapy

## 2020-11-22 ENCOUNTER — Encounter: Payer: Self-pay | Admitting: Physical Therapy

## 2020-11-22 DIAGNOSIS — M25552 Pain in left hip: Secondary | ICD-10-CM | POA: Diagnosis not present

## 2020-11-22 DIAGNOSIS — M6281 Muscle weakness (generalized): Secondary | ICD-10-CM

## 2020-11-22 DIAGNOSIS — R2689 Other abnormalities of gait and mobility: Secondary | ICD-10-CM

## 2020-11-22 DIAGNOSIS — M545 Low back pain, unspecified: Secondary | ICD-10-CM

## 2020-11-22 DIAGNOSIS — R262 Difficulty in walking, not elsewhere classified: Secondary | ICD-10-CM

## 2020-11-22 DIAGNOSIS — G8929 Other chronic pain: Secondary | ICD-10-CM

## 2020-11-22 NOTE — Therapy (Addendum)
[place], [place], [address] Phone: [phone]   Fax:  [phone]  Physical Therapy Treatment/Discharge Summary  Patient Details  Name: Kristin Gilmore MRN: [id number] Date of Birth: [birth year] Referring Provider (PT): Jean Rosenthal MD   Encounter Date: 11/22/2020   PT End of Session - 11/22/20 1210    Visit Number 4    Number of Visits 12    Date for PT Re-Evaluation 12/15/20    Authorization Type Medicare    Progress Note Due on Visit 10    PT Start Time 5056    PT Stop Time 1225    PT Time Calculation (min) 40 min    Activity Tolerance Patient tolerated treatment well    Behavior During Therapy [facility] for tasks assessed/performed           Past Medical History:  Diagnosis Date  . Arthritis   . Depression   . Dyslipidemia   . Hypertension   . Migraine   . Migraine without aura, without mention of intractable migraine without mention of status migrainosus 04/13/2014  . Pituitary microadenoma ([place]) 04/13/2014  . Pre-diabetes    "i was told i was pre-diabetic a while ago"  . Tremor 04/13/2014    Past Surgical History:  Procedure Laterality Date  . CHOLECYSTECTOMY    . COLONOSCOPY W/ POLYPECTOMY    . LUMBAR LAMINECTOMY/DECOMPRESSION MICRODISCECTOMY N/A 12/19/2017   Procedure: LAMINECTOMY LUMBAR TWO- LUMBAR THREE, LUMBAR THREE- LUMBAR FOUR, LUMBAR FOUR- LUMBAR FIVE ;  Surgeon: Consuella Lose, MD;  Location: [facility];  Service: Neurosurgery;  Laterality: N/A;  . NASAL SINUS SURGERY    . TONSILLECTOMY    . WISDOM TOOTH EXTRACTION      There were no vitals filed for this visit.   Subjective Assessment - 11/22/20 1150    Subjective has had some GI distress which is chronic; she hasn't been able to do any of her exercises for the past week because of this.    Pertinent History s/p L2-3, L3-4, L4-5 laminectomy, facetectomy, foraminotomy surgeries performed on 07/31/2020,  08/01/2020, 08/02/2020, and  08/03/2020. essential tremors    How long can you sit comfortably? 2 hours    How long can you walk comfortably? It hurts to walk    Diagnostic tests MRI    Patient Stated Goals Stop hurting    Currently in Pain? No/denies                             [facility] Adult PT Treatment/Exercise - 11/22/20 1151      Knee/Hip Exercises: Aerobic   Nustep L4 x 8 min      Knee/Hip Exercises: Seated   Long Arc Quad Strengthening;Both;20 reps    Ball Squeeze 2x10; 5 sec hold    Other Seated Knee/Hip Exercises seated overhead reach/trunk rotation with shoulder at 90 deg with 2# med ball 2x10    Other Seated Knee/Hip Exercises seated shoulder er ; horizontal abduction 2x10 with red theraband for posture and back strengthening    Marching Strengthening;20 reps;Both    Hamstring Curl Both;2 sets;10 reps   L2 band   Sit to Sand 2 sets;10 reps;with UE support                    PT Short Term Goals - 11/22/20 1210      PT SHORT TERM GOAL #1   Title I with initial HEP.    Baseline  2/23: able to verbalize all exericses    Time 3    Period Weeks    Status Achieved    Target Date 11/17/20      PT SHORT TERM GOAL #2   Title -      PT SHORT TERM GOAL #3   Title n/a             PT Long Term Goals - 11/07/20 1356      PT LONG TERM GOAL #1   Title Patient will demonstrate/report pain at worst less than or equal to 2/10 to facilitate minimal limitation in daily activity secondary to pain symptoms.    Time 6    Period Weeks    Status On-going      PT LONG TERM GOAL #2   Title Patient will demonstrate independent use of home exercise program to facilitate ability to maintain/progress functional gains from skilled physical therapy services.    Time 6    Period Weeks    Status On-going      PT LONG TERM GOAL #3   Title Patient will demonstrate LRAD ambulation community distances > 300 ft to facilitate community integration at [facility].    Time 6    Period Weeks     Status On-going      PT LONG TERM GOAL #4   Title Pt will improve her 5 time  sit to stand to less than 20 seconds in order to improve functinoal mobiltiy.    Time 6    Period Weeks    Status On-going      PT LONG TERM GOAL #5   Title -                 Plan - 11/22/20 1211    Clinical Impression Statement Pt with c/o GI distress today which she is under the care of her PCP for at this time.  Due to discomfort seated exercises only performed today.  Will continue to benefit from PT to maximize function.    Personal Factors and Comorbidities Comorbidity 3+    Comorbidities lumbar laminectomy surgeries x 4 L2-3, L3-4, L4-5 07/31/2020, 08/01/2020, 08/02/2020, 08/03/2020. essential tremors, depression, anxiety, HTN, migraines    Examination-Activity Limitations Stand;Lift;Stairs;Sleep;Transfers    Examination-Participation Restrictions Community Activity;Driving;Other    Stability/Clinical Decision Making Evolving/Moderate complexity    Rehab Potential Fair    PT Frequency 2x / week    PT Duration 6 weeks    PT Treatment/Interventions ADLs/Self Care Home Management;Cryotherapy;Electrical Stimulation;Moist Heat;Gait training;Stair training;Functional mobility training;Therapeutic activities;Patient/family education;Neuromuscular re-education;Therapeutic exercise;Balance training;Manual techniques;Passive range of motion;Dry needling;Taping    PT Next Visit Plan Nustep, hip strengthening, posture correction, core strengthening, generalized endurance and strengthening    PT Home Exercise Plan [id number]    Consulted and Agree with Plan of Care Patient           Patient will benefit from skilled therapeutic intervention in order to improve the following deficits and impairments:     Visit Diagnosis: Pain in left hip  Chronic low back pain, unspecified back pain laterality, unspecified whether sciatica present  Muscle weakness (generalized)  Difficulty in walking, not elsewhere  classified  Other abnormalities of gait and mobility     Problem List Patient Active Problem List   Diagnosis Date Noted  . Trochanteric bursitis, left hip 01/18/2020  . Lumbar spinal stenosis 12/19/2017  . Pain in joint, ankle and foot 05/02/2016  . Chronic venous insufficiency 05/02/2016  . Peripheral edema 11/01/2015  . Intractable  chronic migraine without aura 11/02/2014  . Tremor 04/13/2014  . Migraine without aura 04/13/2014  . Pituitary microadenoma ([place]) 04/13/2014      Laureen Abrahams, PT, DPT 11/22/20 12:34 PM     [facility] [address] [place], [place], [address] Phone: [phone]   Fax:  [phone]  Name: Jakeia Carreras King MRN: [id number] Date of Birth: 1950-12-30      PHYSICAL THERAPY DISCHARGE SUMMARY  Visits from Start of Care: 4  Current functional level related to goals / functional outcomes: See above   Remaining deficits: See above; otherwise unknown   Education / Equipment: HEP  Plan: Patient agrees to discharge.  Patient goals were not met. Patient is being discharged due to not returning since the last visit.  ?????     Laureen Abrahams, PT, DPT 01/03/21 12:25 PM  [facility] [address] [place], [place], [address] Phone: [phone]   Fax:  [phone]

## 2020-11-27 ENCOUNTER — Encounter: Payer: Medicare Other | Admitting: Physical Therapy

## 2020-11-29 ENCOUNTER — Other Ambulatory Visit: Payer: Medicare Other

## 2020-11-29 ENCOUNTER — Encounter: Payer: Medicare Other | Admitting: Physical Therapy

## 2020-11-30 ENCOUNTER — Other Ambulatory Visit: Payer: Self-pay | Admitting: Neurology

## 2020-11-30 ENCOUNTER — Other Ambulatory Visit: Payer: Medicare Other

## 2020-12-07 ENCOUNTER — Ambulatory Visit (HOSPITAL_COMMUNITY)
Admission: RE | Admit: 2020-12-07 | Discharge: 2020-12-07 | Disposition: A | Discharge status: Home / Self Care | Payer: Medicare Other | Source: Ambulatory Visit | Attending: Neurology | Admitting: Neurology

## 2020-12-07 ENCOUNTER — Encounter (HOSPITAL_COMMUNITY)
Admission: RE | Admit: 2020-12-07 | Discharge: 2020-12-07 | Disposition: A | Discharge status: Home / Self Care | Payer: Medicare Other | Source: Ambulatory Visit | Attending: Neurology | Admitting: Neurology

## 2020-12-07 ENCOUNTER — Other Ambulatory Visit: Payer: Self-pay

## 2020-12-07 DIAGNOSIS — R251 Tremor, unspecified: Secondary | ICD-10-CM

## 2020-12-07 IMAGING — NM NM DATSCAN
3 series · 18 of 18 positions shown · non-contrast
Comparison: None.

CLINICAL DATA: 69-year-old female with bilateral hand tremors. LEFT
hand tremor worse than RIGHT.

EXAM:
NUCLEAR MEDICINE BRAIN IMAGING WITH SPECT  (DaTscan )
TECHNIQUE: SPECT images of the brain were obtained after intravenous injection
of radiopharmaceutical. 4 hour post injection imaging. Appropriate
positioning. 130 mg i-STAT given orally for thyroid blockade.
RADIOPHARMACEUTICALS:  4.8 millicuries I 123 Ioflupane

[Series 1: spect - (id) _(id)_tra · 4.1mm · 4.14mm/px · 6 of 128 frames shown]
[frame 11/128]
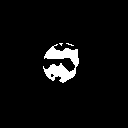
[frame 32/128]
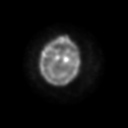
[frame 54/128]
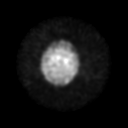
[frame 75/128]
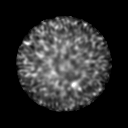
[frame 96/128]
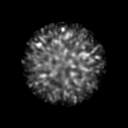
[frame 118/128]
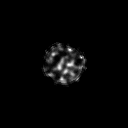

[Series 1: brain spect · 4.14mm/px · 6 of 120 frames shown]
[frame 11/120  full-range]
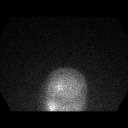
[frame 31/120  full-range]
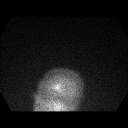
[frame 51/120  full-range]
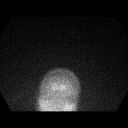
[frame 71/120  full-range]
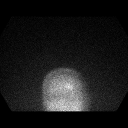
[frame 91/120  full-range]
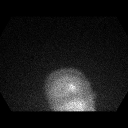
[frame 111/120  full-range]
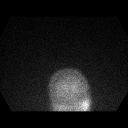

[Series 1: spect - (id) _(id)_cor · 4.1mm · 4.14mm/px · 6 of 128 frames shown]
[frame 11/128]
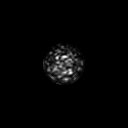
[frame 32/128]
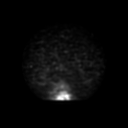
[frame 54/128]
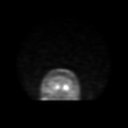
[frame 75/128]
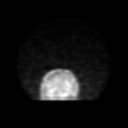
[frame 96/128]
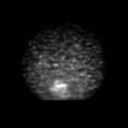
[frame 118/128]
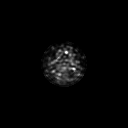

[18 of 18 positions shown; findings below may reference images not displayed]

FINDINGS: Marked decreased radiotracer activity within the RIGHT striatum with
near absent activity in the posterior RIGHT stratum and decreased
activity in the head of the caudate nucleus on the RIGHT. There is
decreased activity within the posterior LEFT striatum and mildly
decreased activity in the head of the LEFT caudate nucleus.
IMPRESSION: Extensive loss of radiotracer activity within the LEFT and RIGHT
stratum with severe loss of activity in the RIGHT striatum. This
pattern can be found in Parkinson's syndromes pathologies.

Of note, DaTSCAN is not diagnostic of Parkinsonian syndromes, which
remains a clinical diagnosis. DaTscan is an adjuvant test to aid in
the clinical diagnosis of Parkinsonian syndromes.

## 2020-12-07 MED ORDER — POTASSIUM IODIDE (ANTIDOTE) 130 MG PO TABS: 130.0000 mg | ORAL_TABLET | Freq: Once | ORAL | Status: AC

## 2020-12-07 MED ORDER — POTASSIUM IODIDE (ANTIDOTE) 130 MG PO TABS
ORAL_TABLET | ORAL | Status: AC
  Administered 2020-12-07: 130 mg via ORAL
  Filled 2020-12-07: qty 1

## 2020-12-07 MED ORDER — IOFLUPANE I 123 185 MBQ/2.5ML IV SOLN
4.7500 | Freq: Once | INTRAVENOUS | Status: AC
  Administered 2020-12-07: 4.75 via INTRAVENOUS

## 2020-12-08 ENCOUNTER — Other Ambulatory Visit: Payer: Medicare Other

## 2020-12-11 ENCOUNTER — Telehealth: Payer: Self-pay | Admitting: Specialist

## 2020-12-11 ENCOUNTER — Telehealth: Payer: Self-pay | Admitting: Neurology

## 2020-12-11 NOTE — Telephone Encounter (Signed)
Pt states that her left knee is in so much pain that she can't put pressure on it. She can hardly even use her walker because it hurts too bad. She would like to know if somehow or someway she could be worked in. She can't make any of with Jeneen Rinks because her husband so she wanted to know if Louanne Skye could give her the ;atest time possible tomorrow or Wednesday.

## 2020-12-11 NOTE — Telephone Encounter (Signed)
I called the patient, recent DaTscan was consistent with Parkinson's disease. Recommend she see Dr. Jannifer [place] in the next several weeks for exam and to discuss next steps. Her husband has PD, so she is very familiar.

## 2020-12-12 NOTE — Telephone Encounter (Signed)
Lmom for pt to call back, advised that Dr. Louanne Skye did not have anything but that I can get her in with another provider.

## 2020-12-12 NOTE — Telephone Encounter (Signed)
Has an appt 12/13/20 @ 1045 with Jeneen Rinks.

## 2020-12-13 ENCOUNTER — Encounter: Payer: Self-pay | Admitting: Physician Assistant

## 2020-12-13 ENCOUNTER — Ambulatory Visit (INDEPENDENT_AMBULATORY_CARE_PROVIDER_SITE_OTHER): Payer: Medicare Other | Admitting: Orthopaedic Surgery

## 2020-12-13 ENCOUNTER — Ambulatory Visit: Payer: Medicare Other | Admitting: Surgery

## 2020-12-13 DIAGNOSIS — M1711 Unilateral primary osteoarthritis, right knee: Secondary | ICD-10-CM

## 2020-12-13 MED ORDER — METHYLPREDNISOLONE ACETATE 40 MG/ML IJ SUSP
40.0000 mg | INTRAMUSCULAR | Status: AC | PRN
  Administered 2020-12-13: 40 mg via INTRA_ARTICULAR

## 2020-12-13 MED ORDER — BUPIVACAINE HCL 0.25 % IJ SOLN
2.0000 mL | INTRAMUSCULAR | Status: AC | PRN
  Administered 2020-12-13: 2 mL via INTRA_ARTICULAR

## 2020-12-13 MED ORDER — LIDOCAINE HCL 1 % IJ SOLN
2.0000 mL | INTRAMUSCULAR | Status: AC | PRN
  Administered 2020-12-13: 2 mL

## 2020-12-13 NOTE — Progress Notes (Signed)
Office Visit Note   Patient: Kristin Gilmore           Date of Birth: 07-10-1951           MRN: [id number] Visit Date: 12/13/2020              Requested by: Aletha Halim., PA-C [address],  [place] [address] PCP: Aletha Halim., PA-C   Assessment & Plan: Visit Diagnoses:  1. Unilateral primary osteoarthritis, right knee     Plan: Impression is right knee arthritis flareup.  Today, we reinjected the right knee with cortisone.  She will follow up with [place] as needed.  Follow-Up Instructions: Return if symptoms worsen or fail to improve.   Orders:  Orders Placed This Encounter  Procedures  . Large Joint Inj: R knee   No orders of the defined types were placed in this encounter.     Procedures: Large Joint Inj: R knee on 12/13/2020 2:28 PM Indications: pain Details: 22 G needle, anterolateral approach Medications: 2 mL lidocaine 1 %; 2 mL bupivacaine 0.25 %; 40 mg methylPREDNISolone acetate 40 MG/ML      Clinical Data: No additional findings.   Subjective: Chief Complaint  Patient presents with  . Right Knee - Pain    HPI patient is a pleasant 70 year old female who comes in today with recurrent right knee pain.  She was seen in our office by Dr. Donavan Burnet in July of this past year where her right knee was injected with cortisone.  She does not seem to remember this but does note that her pain did improve after her visit and again returned about a week ago.  No new injury or change in activity.  All of her pain is to the lateral aspect.  She does have occasional locking.  Walking and squatting seem to make her symptoms worse.  she has been taking over-the-counter pain medication with mild relief of symptoms.  Review of Systems as detailed in HPI.  All others reviewed and are negative.   Objective: Vital Signs: There were no vitals taken for this visit.  Physical Exam well-developed and well-nourished female no acute distress alert and oriented  x3.  Ortho Exam examination of the right knee shows a trace effusion.  Range of motion 0 to 110 degrees.  Lateral joint line tenderness.  Very minimal patellofemoral crepitus.  Ligaments are stable.  She is neurovascular intact distally.  Specialty Comments:  No specialty comments available.  Imaging: No new imaging   PMFS History: Patient Active Problem List   Diagnosis Date Noted  . Trochanteric bursitis, left hip 01/18/2020  . Lumbar spinal stenosis 12/19/2017  . Pain in joint, ankle and foot 05/02/2016  . Chronic venous insufficiency 05/02/2016  . Peripheral edema 11/01/2015  . Intractable chronic migraine without aura 11/02/2014  . Tremor 04/13/2014  . Migraine without aura 04/13/2014  . Pituitary microadenoma ([place]) 04/13/2014   Past Medical History:  Diagnosis Date  . Arthritis   . Depression   . Dyslipidemia   . Hypertension   . Migraine   . Migraine without aura, without mention of intractable migraine without mention of status migrainosus 04/13/2014  . Pituitary microadenoma ([place]) 04/13/2014  . Pre-diabetes    "i was told i was pre-diabetic a while ago"  . Tremor 04/13/2014    Family History  Problem Relation Age of Onset  . Cancer Father        stomach cancer  . Migraines Mother  Past Surgical History:  Procedure Laterality Date  . CHOLECYSTECTOMY    . COLONOSCOPY W/ POLYPECTOMY    . LUMBAR LAMINECTOMY/DECOMPRESSION MICRODISCECTOMY N/A 12/19/2017   Procedure: LAMINECTOMY LUMBAR TWO- LUMBAR THREE, LUMBAR THREE- LUMBAR FOUR, LUMBAR FOUR- LUMBAR FIVE ;  Surgeon: Consuella Lose, MD;  Location: [facility];  Service: Neurosurgery;  Laterality: N/A;  . NASAL SINUS SURGERY    . TONSILLECTOMY    . WISDOM TOOTH EXTRACTION     Social History   Occupational History  . Occupation: Retired  Tobacco Use  . Smoking status: Never Smoker  . Smokeless tobacco: Never Used  Vaping Use  . Vaping Use: Never used  Substance and Sexual Activity  . Alcohol use: Yes     Alcohol/week: 0.0 standard drinks    Comment: occas.  . Drug use: No  . Sexual activity: Not on file

## 2020-12-14 ENCOUNTER — Ambulatory Visit
Admission: RE | Admit: 2020-12-14 | Discharge: 2020-12-14 | Disposition: A | Discharge status: Home / Self Care | Payer: Medicare Other | Source: Ambulatory Visit | Attending: Gastroenterology | Admitting: Gastroenterology

## 2020-12-14 ENCOUNTER — Ambulatory Visit: Payer: Medicare Other | Admitting: Surgery

## 2020-12-14 DIAGNOSIS — R102 Pelvic and perineal pain: Secondary | ICD-10-CM

## 2020-12-14 IMAGING — US US PELVIS COMPLETE WITH TRANSVAGINAL
1 series · 14 of 25 positions shown · non-contrast
Comparison: None

CLINICAL DATA: Pelvic and perineal pain

EXAM:
TRANSABDOMINAL AND TRANSVAGINAL ULTRASOUND OF PELVIS
TECHNIQUE: Both transabdominal and transvaginal ultrasound examinations of the
pelvis were performed. Transabdominal technique was performed for
global imaging of the pelvis including uterus, ovaries, adnexal
regions, and pelvic cul-de-sac. It was necessary to proceed with
endovaginal exam following the transabdominal exam to visualize the
uterus endometrium adnexa.

[Series 1: us pelvis complete with transvaginal · 0.19mm/px · 14 of 45 slices shown]
[im 1/45]
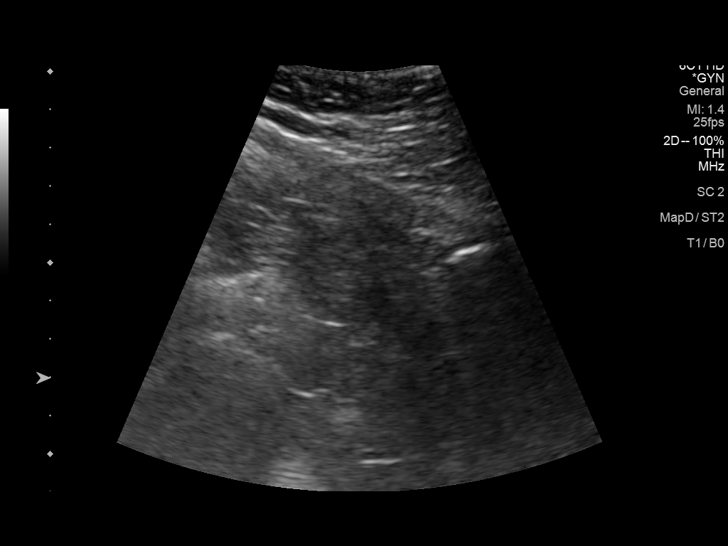
[im 4/45]
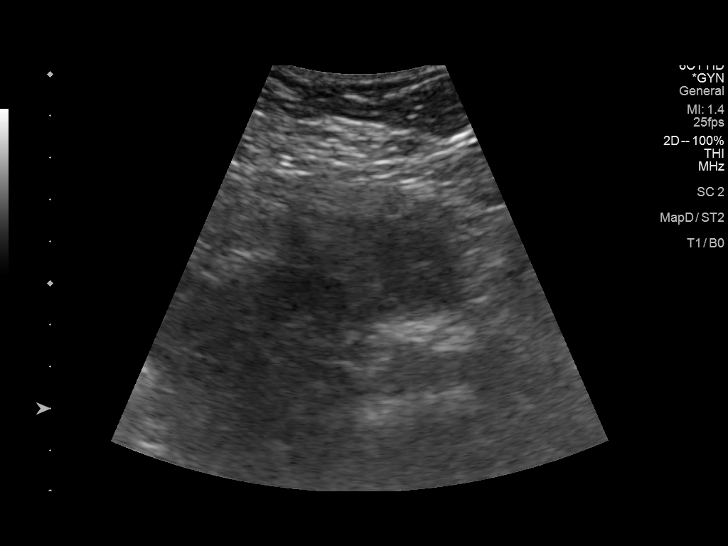
[im 8/45]
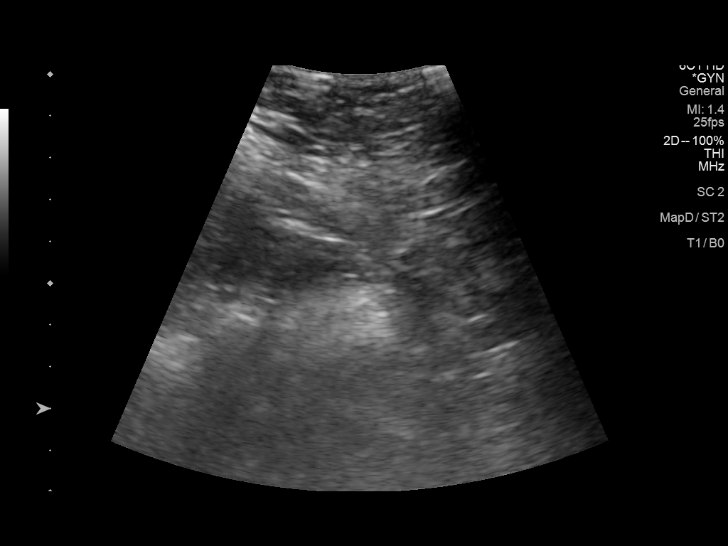
[im 12/45]
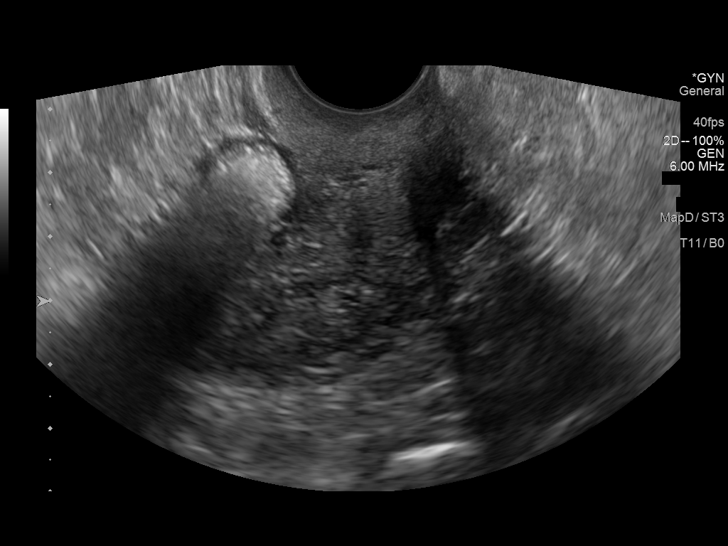
[im 15/45]
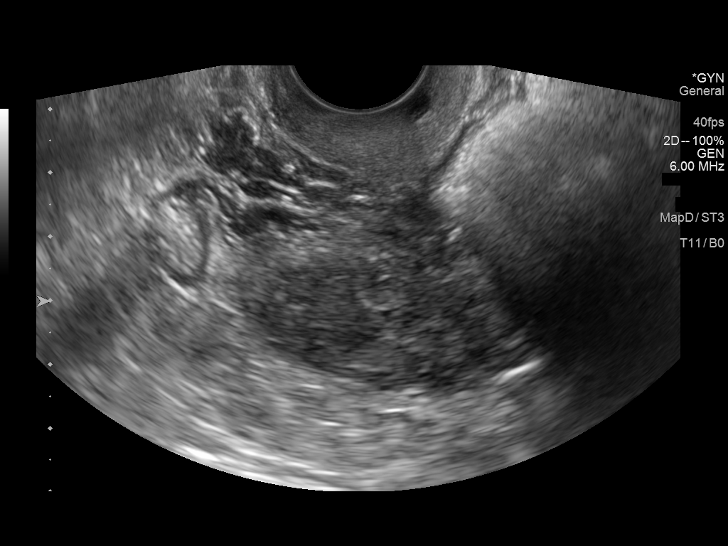
[im 17/45]
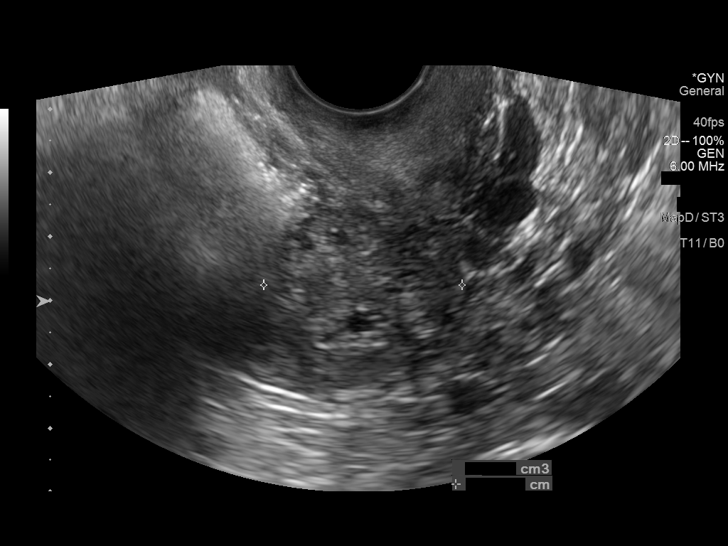
[im 21/45]
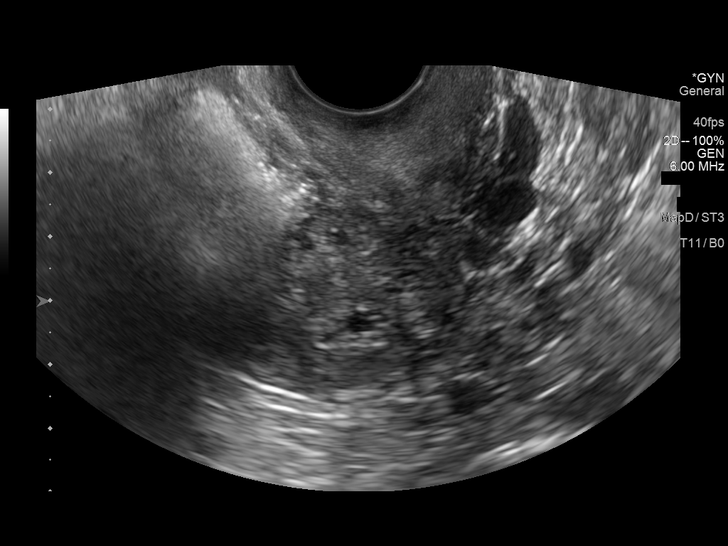
[im 24/45]
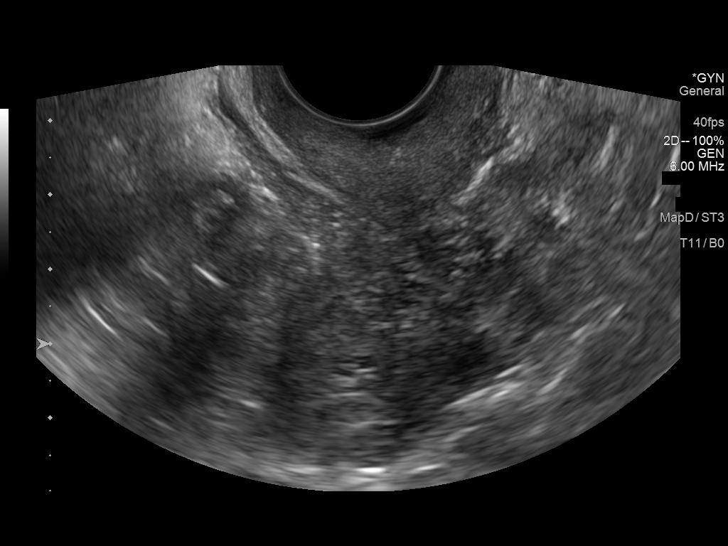
[im 28/45]
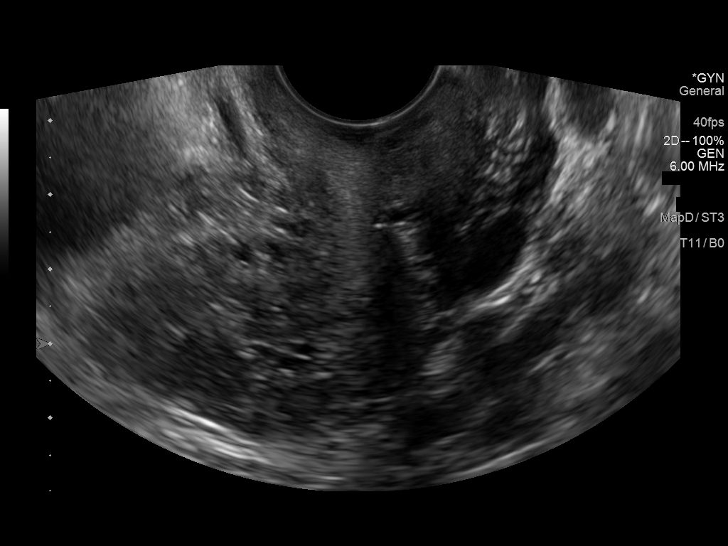
[im 30/45]
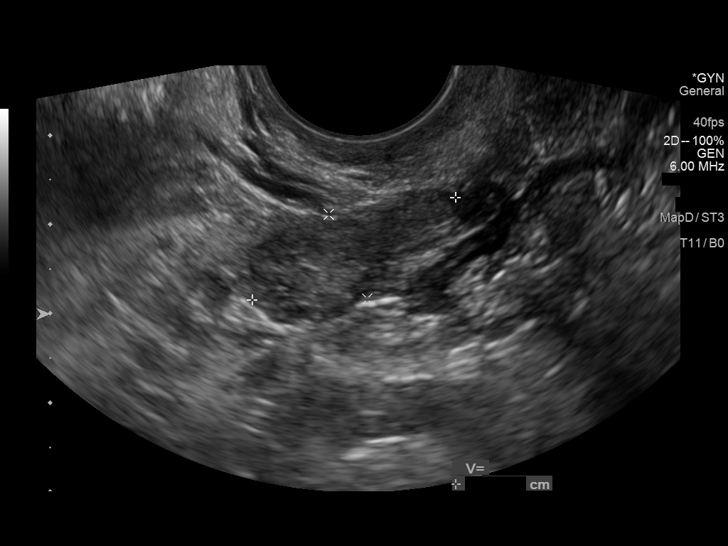
[im 34/45]
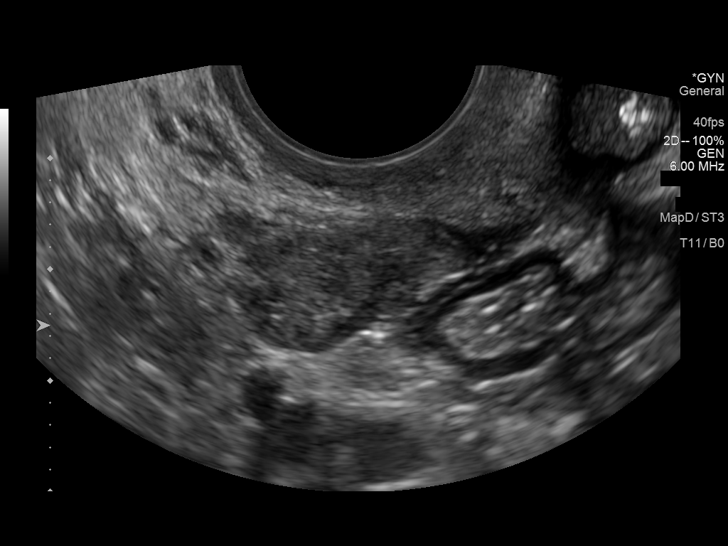
[im 37/45]
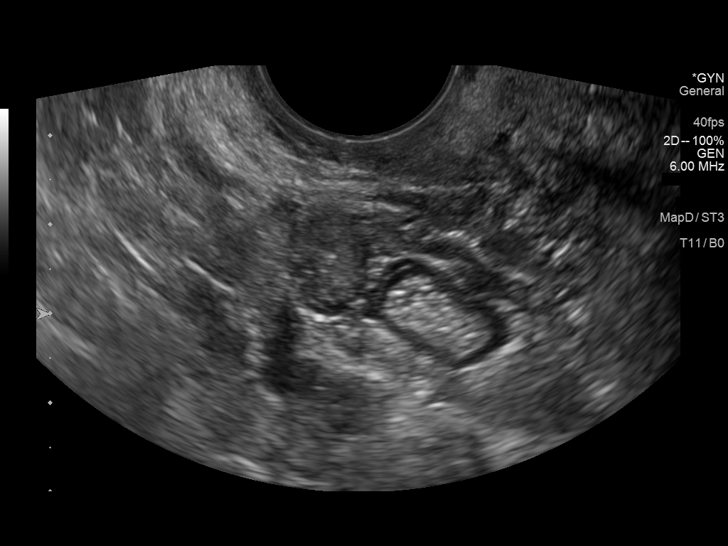
[im 41/45]
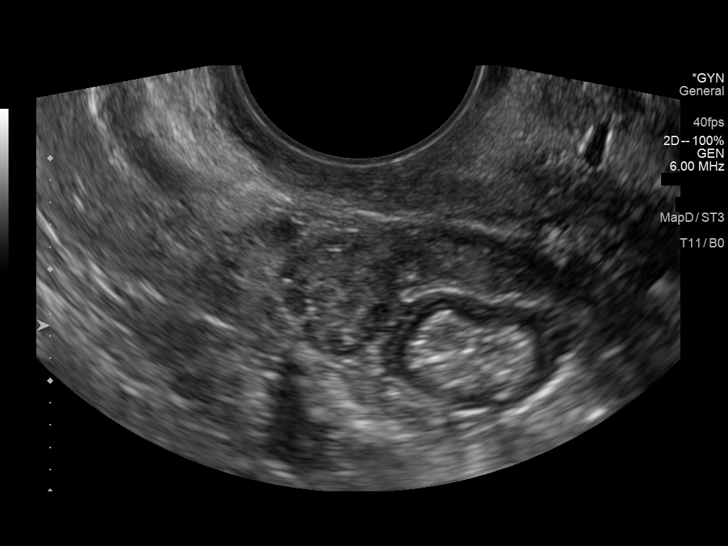
[im 45/45]
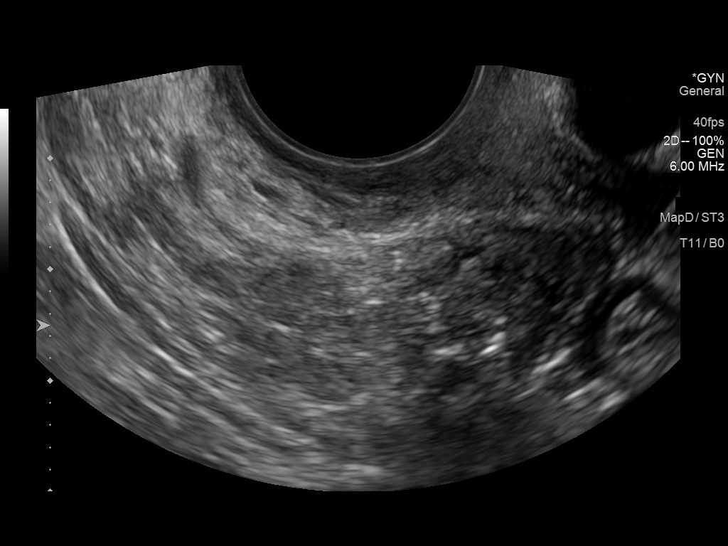

[14 of 25 positions shown; findings below may reference images not displayed]

FINDINGS: Uterus

Measurements: 5.6 x 2.8 x 3.1 cm = volume: 23.2 mL. No fibroids or
other mass visualized.

Endometrium

Thickness: 3.6 mm.  No focal abnormality visualized.

Right ovary

Measurements: 2.6 x 1 x 1 cm = volume: 1.4 mL. Normal appearance/no
adnexal mass.

Left ovary

Not visualized

Other findings

No abnormal free fluid.
IMPRESSION: Nonvisualized left ovary.  Otherwise negative pelvic ultrasound

## 2020-12-15 ENCOUNTER — Telehealth: Payer: Self-pay | Admitting: Physician Assistant

## 2020-12-15 NOTE — Telephone Encounter (Signed)
Patient called. Says she is in a lot of pain and is worst before she came in for the shot. Her call back number is [phone]

## 2020-12-18 NOTE — Telephone Encounter (Signed)
It can take up to two weeks to kick in.  I would take nsaids/tyelnol and apply ice. She sees someone else in clinic and I worked her in for injection.  Probably best to message who she usually sees for this problem

## 2020-12-19 NOTE — Telephone Encounter (Signed)
I called and lmom advising of Lindsay's message below.

## 2020-12-19 NOTE — Telephone Encounter (Signed)
See message below. Do yall recommend anything else.   She was worked in last time for inj.

## 2020-12-22 ENCOUNTER — Other Ambulatory Visit: Payer: Self-pay | Admitting: Neurology

## 2020-12-28 ENCOUNTER — Telehealth: Payer: Self-pay | Admitting: Neurology

## 2020-12-28 MED ORDER — CHLORPROMAZINE HCL 25 MG PO TABS: 25.0000 mg | ORAL_TABLET | Freq: Two times a day (BID) | ORAL | 0 refills | Status: DC

## 2020-12-28 NOTE — Telephone Encounter (Signed)
Thorazine is an as needed medication for prolonged headache, not to be taken routinely. Last filled 11/30/20 6 tablets for rescue. Wait to discuss tremor and Dat Scan findings at next visit with Dr. Jannifer [place], coming up end of April. Considered Sinemet trial, but will hold off for now, she has not been convinced she has PD, tremor not super responsive to Sinemet.

## 2020-12-28 NOTE — Telephone Encounter (Signed)
Spoke to pt and relayed message, pt would like to have thorazine.  I relayed if insistent then will do the time for 6 tabs.  Will not refill again till Dr. Jannifer [place] see's pt in April pt verbalized understanding.  No other changes with propranolol or other medications.  Needs to stay the same course until seen in April.  She understood.

## 2020-12-28 NOTE — Telephone Encounter (Signed)
Called pt and she is asking inf refill thorazine and then also propranolol is not working taking 40mg  po bid.  (she states she is shaking so back cannot even hold book or phone.  Ha DAT scan about 2 wks ago.

## 2020-12-28 NOTE — Telephone Encounter (Signed)
Pt called needing to speak to the RN to discuss her medications with her. Please advise.

## 2020-12-28 NOTE — Addendum Note (Signed)
Addended by: Brandon Melnick on: 12/28/2020 03:47 PM   Modules accepted: Orders

## 2021-01-09 ENCOUNTER — Ambulatory Visit: Payer: Medicare Other | Admitting: Orthopaedic Surgery

## 2021-01-19 ENCOUNTER — Other Ambulatory Visit: Payer: Self-pay | Admitting: Psychiatry

## 2021-01-19 DIAGNOSIS — F3171 Bipolar disorder, in partial remission, most recent episode hypomanic: Secondary | ICD-10-CM

## 2021-01-23 ENCOUNTER — Ambulatory Visit (INDEPENDENT_AMBULATORY_CARE_PROVIDER_SITE_OTHER): Payer: Medicare Other | Admitting: Orthopaedic Surgery

## 2021-01-23 ENCOUNTER — Ambulatory Visit (INDEPENDENT_AMBULATORY_CARE_PROVIDER_SITE_OTHER): Payer: Medicare Other

## 2021-01-23 ENCOUNTER — Other Ambulatory Visit: Payer: Self-pay

## 2021-01-23 ENCOUNTER — Telehealth: Payer: Self-pay

## 2021-01-23 DIAGNOSIS — M1711 Unilateral primary osteoarthritis, right knee: Secondary | ICD-10-CM | POA: Diagnosis not present

## 2021-01-23 NOTE — Telephone Encounter (Signed)
Please submit for right knee gel inj 

## 2021-01-23 NOTE — Progress Notes (Signed)
Office Visit Note   Patient: Kristin Gilmore           Date of Birth: [birth year]           MRN: [id number] Visit Date: 01/23/2021              Requested by: Aletha Halim., PA-C [address]  [place] [address] PCP: Aletha Halim., PA-C   Assessment & Plan: Visit Diagnoses:  1. Unilateral primary osteoarthritis, right knee     Plan: Impression is right knee degenerative joint disease.  We have discussed viscosupplementation approval for which she would like to proceed.  She will follow-up with [place] once approved.  Call with concerns or questions in the meantime. This patient is diagnosed with osteoarthritis of the knee(s).    Radiographs show evidence of joint space narrowing, osteophytes, subchondral sclerosis and/or subchondral cysts.  This patient has knee pain which interferes with functional and activities of daily living.    This patient has experienced inadequate response, adverse effects and/or intolerance with conservative treatments such as acetaminophen, NSAIDS, topical creams, physical therapy or regular exercise, knee bracing and/or weight loss.   This patient has experienced inadequate response or has a contraindication to intra articular steroid injections for at least 3 months.   This patient is not scheduled to have a total knee replacement within 6 months of starting treatment with viscosupplementation.   Follow-Up Instructions: Return for once approved for right knee visco approval.   Orders:  Orders Placed This Encounter  Procedures  . XR Knee 1-2 Views Right   No orders of the defined types were placed in this encounter.     Procedures: No procedures performed   Clinical Data: No additional findings.   Subjective: Chief Complaint  Patient presents with  . Right Knee - Pain    HPI patient is a pleasant 70 year old female who comes in today with continued right knee pain.  She has been dealing with this for years.  She was  seen by [place] recently where right knee was injected with cortisone.  She notes this only helped for about 1 to 2 days.  The pain has returned and is all to the anterolateral aspect.  Pain is worse with activity.  She has tried over-the-counter pain medication as well as prescription pain medication without significant relief.  She has not previously had a viscosupplementation injection.  Review of Systems as detailed in HPI.  All others reviewed and are negative.   Objective: Vital Signs: There were no vitals taken for this visit.  Physical Exam well-developed and well-nourished female in no acute distress.  Alert and oriented x3.  Ortho Exam right knee exam shows moderate tenderness along lateral joint line.  She does have a small effusion.  Range of motion 0 to 115 degrees.  Mild patellofemoral crepitus.  Slight valgus deformity.  She is neurovascular intact distally.  Specialty Comments:  No specialty comments available.  Imaging: XR Knee 1-2 Views Right  Result Date: 01/23/2021 X-rays demonstrate moderate degenerative changes to the lateral compartment with narrowing medial patellofemoral compartments    PMFS History: Patient Active Problem List   Diagnosis Date Noted  . Trochanteric bursitis, left hip 01/18/2020  . Lumbar spinal stenosis 12/19/2017  . Pain in joint, ankle and foot 05/02/2016  . Chronic venous insufficiency 05/02/2016  . Peripheral edema 11/01/2015  . Intractable chronic migraine without aura 11/02/2014  . Tremor 04/13/2014  . Migraine without aura 04/13/2014  . 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 Arthritis   . Depression   . Dyslipidemia   . Hypertension   . Migraine   . Migraine without aura, without mention of intractable migraine without mention of status migrainosus 04/13/2014  . Pituitary microadenoma ([place]) 04/13/2014  . Pre-diabetes    "i was told i was pre-diabetic a while ago"  . Tremor 04/13/2014     Family History  Problem Relation Age of Onset  . Cancer Father        stomach cancer  . Migraines Mother     Past Surgical History:  Procedure Laterality Date  . CHOLECYSTECTOMY    . COLONOSCOPY W/ POLYPECTOMY    . LUMBAR LAMINECTOMY/DECOMPRESSION MICRODISCECTOMY N/A 12/19/2017   Procedure: LAMINECTOMY LUMBAR TWO- LUMBAR THREE, LUMBAR THREE- LUMBAR FOUR, LUMBAR FOUR- LUMBAR FIVE ;  Surgeon: Consuella Lose, MD;  Location: [facility];  Service: Neurosurgery;  Laterality: N/A;  . NASAL SINUS SURGERY    . TONSILLECTOMY    . WISDOM TOOTH EXTRACTION     Social History   Occupational History  . Occupation: Retired  Tobacco Use  . Smoking status: Never Smoker  . Smokeless tobacco: Never Used  Vaping Use  . Vaping Use: Never used  Substance and Sexual Activity  . Alcohol use: Yes    Alcohol/week: 0.0 standard drinks    Comment: occas.  . Drug use: No  . Sexual activity: Not on file

## 2021-01-23 NOTE — Telephone Encounter (Signed)
Noted  

## 2021-01-24 ENCOUNTER — Encounter: Payer: Self-pay | Admitting: Neurology

## 2021-01-24 ENCOUNTER — Ambulatory Visit (INDEPENDENT_AMBULATORY_CARE_PROVIDER_SITE_OTHER): Payer: Medicare Other | Admitting: Neurology

## 2021-01-24 VITALS — BP 141/80 | HR 76 | Ht 67.0 in | Wt 190.0 lb

## 2021-01-24 DIAGNOSIS — G43001 Migraine without aura, not intractable, with status migrainosus: Secondary | ICD-10-CM

## 2021-01-24 DIAGNOSIS — G20A1 Parkinson's disease without dyskinesia, without mention of fluctuations: Secondary | ICD-10-CM

## 2021-01-24 DIAGNOSIS — G20 Parkinson's disease: Secondary | ICD-10-CM | POA: Diagnosis not present

## 2021-01-24 HISTORY — DX: Parkinson's disease without dyskinesia, without mention of fluctuations: G20.A1

## 2021-01-24 HISTORY — DX: Parkinson's disease: G20

## 2021-01-24 MED ORDER — BENZTROPINE MESYLATE 0.5 MG PO TABS: 0.5000 mg | ORAL_TABLET | Freq: Two times a day (BID) | ORAL | 3 refills | Status: DC

## 2021-01-24 NOTE — Patient Instructions (Signed)
We will start cogentin for the tremor.

## 2021-01-24 NOTE — Telephone Encounter (Signed)
Call to RS

## 2021-01-24 NOTE — Progress Notes (Signed)
Reason for visit: Migraine headache, Parkinson's disease  Kristin Gilmore is an 70 y.o. female  History of present illness:  Kristin Gilmore is a 70 year old right-handed white female with a history of intractable migraine headache.  For several years now, she has had a tremor that initially was affecting the left arm but has generalized to the right arm as well.  The patient is also noted tremors affecting the left leg.  She reports no other problems with walking other than that she has significant arthritis affecting the right knee, she may require knee surgery in the future.  She has gotten injections in the knee.  The patient has not noted any shuffling of her gait, or problems with alteration in speech or swallowing.  She continues to have headaches, they occur once or twice a week, treated with Tylenol and ibuprofen.  In the past, the patient has taken Thorazine if needed for her headache as a rescue drug.  A recent DaTscan however has shown evidence consistent with Parkinson's disease.  Past Medical History:  Diagnosis Date  . Arthritis   . Depression   . Dyslipidemia   . Hypertension   . Migraine   . Migraine without aura, without mention of intractable migraine without mention of status migrainosus 04/13/2014  . Parkinson's disease ([facility]) 01/24/2021  . Pituitary microadenoma ([facility]) 04/13/2014  . Pre-diabetes    "i was told i was pre-diabetic a while ago"  . Tremor 04/13/2014    Past Surgical History:  Procedure Laterality Date  . CHOLECYSTECTOMY    . COLONOSCOPY W/ POLYPECTOMY    . LUMBAR LAMINECTOMY/DECOMPRESSION MICRODISCECTOMY N/A 12/19/2017   Procedure: LAMINECTOMY LUMBAR TWO- LUMBAR THREE, LUMBAR THREE- LUMBAR FOUR, LUMBAR FOUR- LUMBAR FIVE ;  Surgeon: Consuella Lose, MD;  Location: [facility];  Service: Neurosurgery;  Laterality: N/A;  . NASAL SINUS SURGERY    . TONSILLECTOMY    . WISDOM TOOTH EXTRACTION      Family History  Problem Relation Age of Onset  . Cancer  Father        stomach cancer  . Migraines Mother     Social history:  reports that she has never smoked. She has never used smokeless tobacco. She reports current alcohol use. She reports that she does not use drugs.    Allergies  Allergen Reactions  . Amoxicillin Hives    UNSPECIFIED REACTION  Has patient had a PCN reaction causing immediate rash, facial/tongue/throat swelling, SOB or lightheadedness with hypotension: Unknown Has patient had a PCN reaction causing severe rash involving mucus membranes or skin necrosis: Unknown Has patient had a PCN reaction that required hospitalization: Unknown Has patient had a PCN reaction occurring within the last 10 years: No If all of the above answers are "NO", then may proceed with Cephalosporin use.   . Clarithromycin Hives, Other (See Comments) and Rash  . Atropine Hives    Medications:  Prior to Admission medications   Medication Sig Start Date End Date Taking? Authorizing Provider  acetaminophen (TYLENOL) 500 MG tablet Take 1,000 mg by mouth 2 (two) times daily.    Yes [provider]  amitriptyline (ELAVIL) 10 MG tablet Take 2 tablets (20 mg total) by mouth at bedtime. 08/30/20  Yes Suzzanne Cloud, NP  amLODipine (NORVASC) 5 MG tablet amlodipine 5 mg tablet  TAKE 1 TABLET BY MOUTH TWICE A DAY 07/27/18  Yes [provider]  baclofen (LIORESAL) 10 MG tablet TAKE 0.5-1 TABLETS (5-10 MG TOTAL) BY MOUTH 3 (THREE)  TIMES DAILY AS NEEDED FOR MUSCLE SPASMS. 05/18/20  Yes Hilts, Legrand Como, MD  Calcium Citrate (CAL-CITRATE PO) Take 1-2 tablets by mouth 2 (two) times daily. TAKE 1 TABLET IN THE MORNING & TAKE 2 TABLETS WITH SUPPER   Yes [provider]  carvedilol (COREG) 25 MG tablet Take a half pill twice per day 07/14/19  Yes [provider]  chlorproMAZINE (THORAZINE) 25 MG tablet Take 1 tablet (25 mg total) by mouth 2 (two) times daily. 12/28/20  Yes Suzzanne Cloud, NP  Cholecalciferol (VITAMIN D3) 2000 UNITS  TABS Take 2,000 Units by mouth daily with supper.    Yes [provider]  Coenzyme Q10 (COQ10) 100 MG CAPS Take 100 mg by mouth daily with supper.   Yes [provider]  dexamethasone (DECADRON) 2 MG tablet Take 3 tablets the first day, 2 the second and 1 the third day 09/21/20  Yes Kathrynn Ducking, MD  ferrous sulfate 325 (65 FE) MG EC tablet Take 1 tablet by mouth 2 (two) times daily. 10/05/19  Yes [provider]  fluticasone (FLONASE) 50 MCG/ACT nasal spray Place 1 spray into both nostrils daily.  01/18/17  Yes [provider]  gabapentin (NEURONTIN) 300 MG capsule Take 300 mg by mouth every 6 (six) hours as needed.    Yes [provider]  glimepiride (AMARYL) 2 MG tablet Take 2 mg by mouth every morning. 10/19/19  Yes [provider]  Glucosamine-Chondroitin (COSAMIN DS PO) Take 3 capsules by mouth daily. 1500 glucosamine   Yes [provider]  hydrochlorothiazide (HYDRODIURIL) 25 MG tablet Take 25 mg by mouth daily. 04/10/19  Yes [provider]  loperamide (IMODIUM) 2 MG capsule Take 2-4 mg by mouth 2 (two) times daily as needed for diarrhea or loose stools.   Yes [provider]  MAGNESIUM SULFATE PO Take 1 tablet by mouth at bedtime.    Yes [provider]  methylPREDNISolone (MEDROL) 4 MG tablet Take 1 tablet (4 mg total) by mouth daily. 08/21/20  Yes Jessy Oto, MD  nabumetone (RELAFEN) 750 MG tablet TAKE 1 TABLET (750 MG TOTAL) BY MOUTH 2 (TWO) TIMES DAILY AS NEEDED. 04/27/20  Yes Hilts, Legrand Como, MD  nystatin cream (MYCOSTATIN) APPLY TO AREA TWICE A DAY 08/17/19  Yes [provider]  Omega 3 1000 MG CAPS Take 1,000 mg by mouth 2 (two) times daily. SUPER OMEGA-3   Yes [provider]  OVER THE COUNTER MEDICATION Take 2 capsules by mouth daily with supper. Quick 6 Weight Loss Supplement   Yes [provider]  Probiotic Product (PROBIOTIC PO) Take 1 capsule by mouth daily.  SUPREMA DOPHILUS 5 BILLION CFU   Yes [provider]  propranolol (INDERAL) 20 MG tablet Take 1 tablet twice daily x 2 weeks, then take 2 tablets twice daily 08/30/20  Yes Suzzanne Cloud, NP  QUEtiapine (SEROQUEL) 300 MG tablet TAKE 0.5 TABLETS (150 MG TOTAL) BY MOUTH AT BEDTIME. 05/04/20  Yes Cottle, Billey Co., MD  Red Yeast Rice 600 MG CAPS Take 1,200 mg by mouth 2 (two) times daily.   Yes [provider]  ROYAL JELLY PO Take 150 mg by mouth daily.   Yes [provider]  SUMAtriptan (IMITREX) 50 MG tablet 1 AS NEEDED FOR HEADACHE MAY REPEAT IN 2HR IF HEADACHE PERSISTS/RECURS NO MORE THAN 2/DAY OR 4/WK 08/30/20  Yes Suzzanne Cloud, NP  telmisartan (MICARDIS) 80 MG tablet telmisartan 80 mg tablet   Yes [provider]  tiZANidine (ZANAFLEX) 2 MG tablet TAKE 1-2 TABLETS (2-4 MG TOTAL) BY MOUTH EVERY 6 (SIX) HOURS AS NEEDED FOR MUSCLE SPASMS. 02/25/20  Yes Hilts, Legrand Como, MD  traMADol-acetaminophen (ULTRACET) 37.5-325 MG tablet Take 1 tablet by mouth every 6 (six) hours as needed. 07/06/20  Yes Jessy Oto, MD  traMADol-acetaminophen (ULTRACET) 37.5-325 MG tablet TAKE 1 TABLET BY MOUTH EVERY 6 HOURS AS NEEDED 10/11/20  Yes Jessy Oto, MD  TURMERIC PO Take 475 mg by mouth 2 (two) times daily. WITH FOOD   Yes [provider]  vitamin E 400 UNIT capsule Take 400 Units by mouth daily with supper.    Yes [provider]    ROS:  Out of a complete 14 system review of symptoms, the patient complains only of the following symptoms, and all other reviewed systems are negative.  Tremors Headache  Blood pressure (!) 141/80, pulse 76, height 5\' 7"  (1.702 m), weight 190 lb (86.2 kg).  Physical Exam  General: The patient is alert and cooperative at the time of the examination.  Skin: No significant peripheral edema is noted.   Neurologic Exam  Mental status: The patient is alert and oriented x 3 at the time of the examination. The patient has  apparent normal recent and remote memory, with an apparently normal attention span and concentration ability.   Cranial nerves: Facial symmetry is present. Speech is normal, no aphasia or dysarthria is noted. Extraocular movements are full. Visual fields are full.  Motor: The patient has good strength in all 4 extremities.  Sensory examination: Soft touch sensation is symmetric on the face, arms, and legs.  Coordination: The patient has good finger-nose-finger and heel-to-shin bilaterally.  Prominent resting tremors are noted in both upper extremities and on the left lower extremity.  Gait and station: The patient has a limping type gait on the right leg. Tandem gait is normal. Romberg is negative. No drift is seen.  Reflexes: Deep tendon reflexes are symmetric.   Assessment/Plan:  1.  Probable Parkinson's disease  2.  Intractable migraine headache  The patient has very few features of Parkinson's disease other than the tremor.  The tremor clearly affects the left lower extremity which is not typical for an essential tremor and the DaTscan suggests Parkinson's disease.  The patient will be started on Cogentin for the tremor, if she develops more signs of parkinsonism in the future, Sinemet will be added.  The patient will follow up here in 4 to 5 months.  She will stop the Thorazine.  Jill Alexanders MD 01/24/2021 2:36 PM  [facility] [address] [place], [place] [address]  Phone [phone] Fax [phone]

## 2021-01-25 NOTE — Telephone Encounter (Signed)
LM TCB for an appt

## 2021-01-26 NOTE — Telephone Encounter (Signed)
noted 

## 2021-02-12 ENCOUNTER — Telehealth: Payer: Self-pay | Admitting: Physician Assistant

## 2021-02-12 NOTE — Telephone Encounter (Signed)
ok 

## 2021-02-12 NOTE — Telephone Encounter (Signed)
Patient called. She would like to know the status of her gel injections authorization. Her call back number is [phone]

## 2021-02-13 ENCOUNTER — Telehealth: Payer: Self-pay

## 2021-02-13 NOTE — Telephone Encounter (Signed)
Talked with patient to schedule an appointment for gel injection, but patient could not schedule at this time.

## 2021-02-13 NOTE — Telephone Encounter (Signed)
Approved for SYnviscOne, right knee. [place] deductible has been met. Covered at 100% trough [employer] after Medicare pays No Co-pay No PA required

## 2021-02-13 NOTE — Telephone Encounter (Signed)
VOB has been submitted for SynvisOne, right knee. Pending BV.

## 2021-02-15 ENCOUNTER — Telehealth: Payer: Self-pay | Admitting: Neurology

## 2021-02-15 NOTE — Telephone Encounter (Signed)
Pt called, the dosage I'm taking of benztropine (COGENTIN) 0.5 MG tablet is not working. Want to know if I can double the dosage? Would like a call from the nurse.

## 2021-02-17 MED ORDER — BENZTROPINE MESYLATE 1 MG PO TABS: 1.0000 mg | ORAL_TABLET | Freq: Two times a day (BID) | ORAL | 3 refills | Status: DC

## 2021-02-17 NOTE — Telephone Encounter (Signed)
I called the patient.  The patient will go up on the Cogentin taking 0.5 mg dosing 3 times daily, use up the prescription bottle and then switch over to the 1 mg tablet taking 1 tablet twice daily.

## 2021-02-19 ENCOUNTER — Telehealth: Payer: Self-pay | Admitting: Neurology

## 2021-02-19 NOTE — Telephone Encounter (Signed)
Pt called and LVM stating that the benztropine (COGENTIN) 1 MG tablet is not helping her with her sweats, she is still waking up dripping wet. Please advise.

## 2021-02-19 NOTE — Telephone Encounter (Signed)
I called the patient.  The patient does not believe that the Cogentin is helping her tremors, she also began having night sweats around the time she started the medication.  She will go off the Cogentin to see if the night sweats go away.

## 2021-02-20 ENCOUNTER — Ambulatory Visit: Payer: Medicare Other | Admitting: Orthopaedic Surgery

## 2021-02-28 ENCOUNTER — Ambulatory Visit: Payer: Medicare Other | Admitting: Neurology

## 2021-03-06 ENCOUNTER — Ambulatory Visit (INDEPENDENT_AMBULATORY_CARE_PROVIDER_SITE_OTHER): Payer: Medicare Other | Admitting: Orthopaedic Surgery

## 2021-03-06 DIAGNOSIS — M1711 Unilateral primary osteoarthritis, right knee: Secondary | ICD-10-CM

## 2021-03-06 MED ORDER — LIDOCAINE HCL 1 % IJ SOLN
2.0000 mL | INTRAMUSCULAR | Status: AC | PRN
  Administered 2021-03-06: 2 mL

## 2021-03-06 MED ORDER — HYLAN G-F 20 48 MG/6ML IX SOSY
48.0000 mg | PREFILLED_SYRINGE | INTRA_ARTICULAR | Status: AC | PRN
  Administered 2021-03-06: 48 mg via INTRA_ARTICULAR

## 2021-03-06 MED ORDER — BUPIVACAINE HCL 0.25 % IJ SOLN
2.0000 mL | INTRAMUSCULAR | Status: AC | PRN
  Administered 2021-03-06: 2 mL via INTRA_ARTICULAR

## 2021-03-06 NOTE — Progress Notes (Signed)
Office Visit Note   Patient: Kristin Gilmore           Date of Birth: 18-Apr-1951           MRN: [id number] Visit Date: 03/06/2021              Requested by: Aletha Halim., PA-C [address]  [place] [address] PCP: Aletha Halim., PA-C   Assessment & Plan: Visit Diagnoses:  1. Unilateral primary osteoarthritis, right knee     Plan: Impression is right knee advanced degenerative joint disease.  Today we proceeded with Synvisc 1 injection to the right knee.  She tolerated this well.  She will follow-up with [place] as needed.  Call with concerns or questions.  Follow-Up Instructions: Return if symptoms worsen or fail to improve.   Orders:  Orders Placed This Encounter  Procedures  . Large Joint Inj: R knee   No orders of the defined types were placed in this encounter.     Procedures: Large Joint Inj: R knee on 03/06/2021 1:51 PM Indications: pain Details: 22 G needle, anterolateral approach Medications: 2 mL lidocaine 1 %; 2 mL bupivacaine 0.25 %; 48 mg Hylan 48 MG/6ML      Clinical Data: No additional findings.   Subjective: Chief Complaint  Patient presents with  . Right Knee - Follow-up    HPI patient is a pleasant 70 year old female who comes in today for right knee Synvisc 1 injection.  She does have a history of underlying osteoarthritis to the right knee and is previously had cortisone injections without significant relief.  She has not previously undergone viscosupplementation injection.     Objective: Vital Signs: There were no vitals taken for this visit.    Ortho Exam stable right knee exam without effusion  Specialty Comments:  No specialty comments available.  Imaging: No new imaging   PMFS History: Patient Active Problem List   Diagnosis Date Noted  . Parkinson's disease ([place]) 01/24/2021  . Trochanteric bursitis, left hip 01/18/2020  . Lumbar spinal stenosis 12/19/2017  . Pain in joint, ankle and foot 05/02/2016   . Chronic venous insufficiency 05/02/2016  . Peripheral edema 11/01/2015  . Intractable chronic migraine without aura 11/02/2014  . Tremor 04/13/2014  . Migraine without aura 04/13/2014  . Pituitary microadenoma ([place]) 04/13/2014   Past Medical History:  Diagnosis Date  . Arthritis   . Depression   . Dyslipidemia   . Hypertension   . Migraine   . Migraine without aura, without mention of intractable migraine without mention of status migrainosus 04/13/2014  . Parkinson's disease ([place]) 01/24/2021  . Pituitary microadenoma ([place]) 04/13/2014  . Pre-diabetes    "i was told i was pre-diabetic a while ago"  . Tremor 04/13/2014    Family History  Problem Relation Age of Onset  . Cancer Father        stomach cancer  . Migraines Mother     Past Surgical History:  Procedure Laterality Date  . CHOLECYSTECTOMY    . COLONOSCOPY W/ POLYPECTOMY    . LUMBAR LAMINECTOMY/DECOMPRESSION MICRODISCECTOMY N/A 12/19/2017   Procedure: LAMINECTOMY LUMBAR TWO- LUMBAR THREE, LUMBAR THREE- LUMBAR FOUR, LUMBAR FOUR- LUMBAR FIVE ;  Surgeon: Consuella Lose, MD;  Location: [facility];  Service: Neurosurgery;  Laterality: N/A;  . NASAL SINUS SURGERY    . TONSILLECTOMY    . WISDOM TOOTH EXTRACTION     Social History   Occupational History  . Occupation: Retired  Tobacco Use  .  Smoking status: Never Smoker  . Smokeless tobacco: Never Used  Vaping Use  . Vaping Use: Never used  Substance and Sexual Activity  . Alcohol use: Yes    Alcohol/week: 0.0 standard drinks    Comment: occas.  . Drug use: No  . Sexual activity: Not on file

## 2021-03-07 ENCOUNTER — Other Ambulatory Visit: Payer: Self-pay | Admitting: Neurology

## 2021-03-07 ENCOUNTER — Telehealth: Payer: Self-pay | Admitting: Neurology

## 2021-03-07 MED ORDER — TRIHEXYPHENIDYL HCL 2 MG PO TABS: 2.0000 mg | ORAL_TABLET | Freq: Three times a day (TID) | ORAL | 3 refills | Status: DC

## 2021-03-07 NOTE — Telephone Encounter (Signed)
I called the patient.  The patient is having ongoing problems with tremors.  I will try her on Artane 2 mg 3 times daily.  She went off the Cogentin, was still having night sweats, this medication likely had nothing to do with the night sweats, but was not helping the tremor.

## 2021-03-07 NOTE — Telephone Encounter (Signed)
Pt called, tremors are getting worse. Can prescribe me another medication? Would like a call from the nurse.

## 2021-03-08 ENCOUNTER — Other Ambulatory Visit: Payer: Self-pay | Admitting: Neurology

## 2021-03-18 ENCOUNTER — Other Ambulatory Visit: Payer: Self-pay | Admitting: Neurology

## 2021-04-05 ENCOUNTER — Telehealth: Payer: Self-pay | Admitting: Specialist

## 2021-04-05 ENCOUNTER — Ambulatory Visit: Payer: Medicare Other | Admitting: Surgery

## 2021-04-05 NOTE — Telephone Encounter (Signed)
Pt calling to see if Louanne Skye has any openings prior to 7/28 as she is in severe pain. Originally said she would see Ricard Dillon as he is the PA and could see her sooner than [place] but appt notes for 7/7 said to resch her appt with Ricard Dillon and to see Nitka. Pt stated she has called repeatedly to check on cancellations for Nitka. The best call back number is [phone].

## 2021-04-05 NOTE — Telephone Encounter (Signed)
I called and advised patient that I will put her on the cancellation list and call if someone cancels, she states that she only wants an afternoon appt due to both her and her husband having Parkinson Disease and they are not functioning in the mornings

## 2021-04-09 ENCOUNTER — Ambulatory Visit: Payer: Medicare Other

## 2021-04-09 ENCOUNTER — Ambulatory Visit (INDEPENDENT_AMBULATORY_CARE_PROVIDER_SITE_OTHER): Payer: Medicare Other | Admitting: Specialist

## 2021-04-09 ENCOUNTER — Ambulatory Visit: Payer: Self-pay

## 2021-04-09 ENCOUNTER — Other Ambulatory Visit: Payer: Self-pay

## 2021-04-09 ENCOUNTER — Encounter: Payer: Self-pay | Admitting: Specialist

## 2021-04-09 VITALS — BP 168/79 | HR 78 | Ht 67.0 in | Wt 174.5 lb

## 2021-04-09 DIAGNOSIS — M4726 Other spondylosis with radiculopathy, lumbar region: Secondary | ICD-10-CM

## 2021-04-09 DIAGNOSIS — M4316 Spondylolisthesis, lumbar region: Secondary | ICD-10-CM

## 2021-04-09 DIAGNOSIS — M1711 Unilateral primary osteoarthritis, right knee: Secondary | ICD-10-CM

## 2021-04-09 DIAGNOSIS — M5136 Other intervertebral disc degeneration, lumbar region: Secondary | ICD-10-CM

## 2021-04-09 NOTE — Patient Instructions (Addendum)
Avoid bending, stooping and avoid lifting weights greater than 10 lbs. Avoid prolong standing and walking. Avoid frequent bending and stooping  No lifting greater than 10 lbs. May use ice or moist heat for pain. Weight loss is of benefit. Handicap license is approved. Dr. Romona Curls secretary/Assistant will call to arrange for blocks of the facets joints that are arthritic in your lumbar spine. Knee is suffering from osteoarthritis, only real proven treatments are Ice the knee that is suffering from osteoarthritis, only real proven treatments are Weight loss, NSIADs like diclofenac gel, nabumetome and the use of Zotrix creame or capsaicin creame for knee arthritis and exercise. Well padded shoes help. Ice the knee 2-3 times a day 15-20 mins at a time.-3 times a day 15-20 mins at a time. Hot showers in the AM.  Injection with steroid may be of benefit. Hemp CBD capsules, amazon.com 5,000-7,000 mg per bottle, 60 capsules per bottle, take one capsule twice a day. Cane in the right hand to use with right leg weight bearing. Follow-Up Instructions: No follow-ups on file.

## 2021-04-09 NOTE — Progress Notes (Signed)
Office Visit Note   Patient: Kristin Gilmore           Date of Birth: 02/14/1951           MRN: [id number] Visit Date: 04/09/2021              Requested by: Aletha Halim., PA-C [address]  [place] [address] PCP: Aletha Halim., PA-C   Assessment & Plan: Visit Diagnoses:  1. DDD (degenerative disc disease), lumbar   2. Unilateral primary osteoarthritis, right knee   3. Other spondylosis with radiculopathy, lumbar region   4. Spondylolisthesis, lumbar region     Plan: Avoid bending, stooping and avoid lifting weights greater than 10 lbs. Avoid prolong standing and walking. Avoid frequent bending and stooping  No lifting greater than 10 lbs. May use ice or moist heat for pain. Weight loss is of benefit. Handicap license is approved. Dr. Romona Curls secretary/Assistant will call to arrange for blocks of the facets joints that are arthritic in your lumbar spine. Knee is suffering from osteoarthritis, only real proven treatments are Ice the knee that is suffering from osteoarthritis, only real proven treatments are Weight loss, NSIADs like diclofenac gel, nabumetome and the use of Zotrix creame or capsaicin creame for knee arthritis and exercise. Well padded shoes help. Ice the knee 2-3 times a day 15-20 mins at a time.-3 times a day 15-20 mins at a time. Hot showers in the AM.  Injection with steroid may be of benefit. Hemp CBD capsules, amazon.com 5,000-7,000 mg per bottle, 60 capsules per bottle, take one capsule twice a day. Cane in the right hand to use with right leg weight bearing. Follow-Up Instructions: No follow-ups on file.     Follow-Up Instructions: No follow-ups on file.   Orders:  Orders Placed This Encounter  Procedures   XR Lumbar Spine 2-3 Views   XR KNEE 3 VIEW RIGHT   Ambulatory referral to Physical Medicine Rehab   No orders of the defined types were placed in this encounter.     Procedures: No procedures  performed   Clinical Data: No additional findings.   Subjective: Chief Complaint  Patient presents with   Lower Back - Follow-up, Pain    Back is painful, hurts to stand or walked for any length of time. She states that she was doing exercises but was told to stop them since they were hurting her knee.   Right Knee - Follow-up, Pain    Had Gel Injection with Kristin Gilmore, about 3 weeks ago. And was told that she had to give the injection 6 weeks to see how much it really helps.    HPI  Review of Systems   Objective: Vital Signs: BP (!) 168/79 (BP Location: Right Arm, Patient Position: Sitting)   Pulse 78   Ht 5\' 7"  (1.702 m)   Wt 174 lb 8 oz (79.2 kg)   BMI 27.33 kg/m   Physical Exam  Ortho Exam  Specialty Comments:  No specialty comments available.  Imaging: No results found.   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  Past Medical History:  Diagnosis Date   Arthritis    Depression    Dyslipidemia    Hypertension    Migraine    Migraine without aura, without mention of intractable migraine without mention of status migrainosus 04/13/2014   Parkinson's disease ([place]) 01/24/2021   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   Pre-diabetes    "i was told i was pre-diabetic a while ago"   Tremor 04/13/2014    Family History  Problem Relation Age of Onset   Cancer Father        stomach cancer   Migraines Mother     Past Surgical History:  Procedure Laterality Date   CHOLECYSTECTOMY     COLONOSCOPY W/ POLYPECTOMY     LUMBAR  LAMINECTOMY/DECOMPRESSION MICRODISCECTOMY N/A 12/19/2017   Procedure: LAMINECTOMY LUMBAR TWO- LUMBAR THREE, LUMBAR THREE- LUMBAR FOUR, LUMBAR FOUR- LUMBAR FIVE ;  Surgeon: Consuella Lose, MD;  Location: [facility];  Service: Neurosurgery;  Laterality: N/A;   NASAL SINUS SURGERY     TONSILLECTOMY     WISDOM TOOTH EXTRACTION     Social History   Occupational History   Occupation: Retired  Tobacco Use   Smoking status: Never   Smokeless tobacco: Never  Vaping Use   Vaping Use: Never used  Substance and Sexual Activity   Alcohol use: Yes    Alcohol/week: 0.0 standard drinks    Comment: occas.   Drug use: No   Sexual activity: Not on file

## 2021-04-17 ENCOUNTER — Ambulatory Visit: Payer: Medicare Other | Admitting: Orthopaedic Surgery

## 2021-04-18 ENCOUNTER — Ambulatory Visit: Payer: Medicare Other | Admitting: Podiatry

## 2021-04-19 ENCOUNTER — Telehealth: Payer: Self-pay | Admitting: Specialist

## 2021-04-19 NOTE — Telephone Encounter (Signed)
Patient called. She would like a nurse to call her. Has questions about her pain. Her call back number is [phone]

## 2021-04-20 ENCOUNTER — Telehealth: Payer: Self-pay | Admitting: Specialist

## 2021-04-20 NOTE — Telephone Encounter (Signed)
I called and spoke with patient, I advised that the upper leg pain can be coming from her back. I did advise her to let Dr. Ernestina Patches know this on Monday and this may help him to determine where he needs to do the injection. She states that she understands and will let him know on Monday

## 2021-04-20 NOTE — Telephone Encounter (Signed)
See other message

## 2021-04-20 NOTE — Telephone Encounter (Signed)
Lmom for pt to call us back. 

## 2021-04-20 NOTE — Telephone Encounter (Signed)
Patient called advised she may have a pinched nerve in her upper right leg. Patient asked if Dr. Ernestina Patches can address the issue when she see him for her back? The number to contact patient is [phone]

## 2021-04-23 ENCOUNTER — Ambulatory Visit (INDEPENDENT_AMBULATORY_CARE_PROVIDER_SITE_OTHER): Payer: Medicare Other | Admitting: Physical Medicine and Rehabilitation

## 2021-04-23 ENCOUNTER — Encounter: Payer: Self-pay | Admitting: Physical Medicine and Rehabilitation

## 2021-04-23 ENCOUNTER — Other Ambulatory Visit: Payer: Self-pay

## 2021-04-23 ENCOUNTER — Ambulatory Visit: Payer: Self-pay

## 2021-04-23 VITALS — BP 129/46 | HR 102

## 2021-04-23 DIAGNOSIS — M47816 Spondylosis without myelopathy or radiculopathy, lumbar region: Secondary | ICD-10-CM | POA: Diagnosis not present

## 2021-04-23 MED ORDER — BUPIVACAINE HCL 0.5 % IJ SOLN
3.0000 mL | Freq: Once | INTRAMUSCULAR | Status: AC
  Administered 2021-04-23: 3 mL

## 2021-04-23 NOTE — Patient Instructions (Signed)

## 2021-04-23 NOTE — Progress Notes (Signed)
Pt state lower back pain that travels to her right hip and thigh. Pt state walking makes the pain worse. Pt state she take [place] meds to help ese her pain.  Numeric Pain Rating Scale and Functional Assessment Average Pain 4   In the last MONTH (on 0-10 scale) has pain interfered with the following?  1. General activity like being  able to carry out your everyday physical activities such as walking, climbing stairs, carrying groceries, or moving a chair?  Rating(10)   +Driver, -BT, -Dye Allergies.

## 2021-04-24 ENCOUNTER — Ambulatory Visit (INDEPENDENT_AMBULATORY_CARE_PROVIDER_SITE_OTHER): Payer: Medicare Other | Admitting: Orthopaedic Surgery

## 2021-04-24 ENCOUNTER — Encounter: Payer: Self-pay | Admitting: Orthopaedic Surgery

## 2021-04-24 ENCOUNTER — Ambulatory Visit (INDEPENDENT_AMBULATORY_CARE_PROVIDER_SITE_OTHER): Payer: Medicare Other

## 2021-04-24 ENCOUNTER — Other Ambulatory Visit: Payer: Self-pay

## 2021-04-24 DIAGNOSIS — M1711 Unilateral primary osteoarthritis, right knee: Secondary | ICD-10-CM

## 2021-04-24 DIAGNOSIS — E11610 Type 2 diabetes mellitus with diabetic neuropathic arthropathy: Secondary | ICD-10-CM

## 2021-04-24 DIAGNOSIS — M25551 Pain in right hip: Secondary | ICD-10-CM

## 2021-04-24 NOTE — Addendum Note (Signed)
Addended by: Jacklyn Shell on: 04/24/2021 04:29 PM   Modules accepted: Orders

## 2021-04-24 NOTE — Progress Notes (Addendum)
Office Visit Note   Patient: Kristin Gilmore           Date of Birth: October 09, 1950           MRN: [id number] Visit Date: 04/24/2021              Requested by: Aletha Halim., PA-C [address],  [place] [address] PCP: Aletha Halim., PA-C   Assessment & Plan: Visit Diagnoses:  1. Primary osteoarthritis of right knee   2. Right hip pain     Plan: Impression is right knee degenerative joint disease.  The patient has failed conservative treatment to include cortisone and viscosupplementation injections , activity modifications, oral medications, home exercise program and continues to have debilitating pain with ADLs and would like to proceed with total knee replacement surgery.  Risk, benefits and poss medications reviewed.  Rehab recovery time discussed.  She has underlying type II diabetic so we will need to obtain a new hemoglobin A1c.  We will also get medical clearance prior to surgery.    In regards to the right hip x-rays were normal.  Findings consistent with contusion and traumatic bursitis.  She will treat this with RICE.  Total face to face encounter time was greater than 25 minutes and over half of this time was spent in counseling and/or coordination of care.  Follow-Up Instructions: Return for post-op.   Orders:  Orders Placed This Encounter  Procedures   XR HIP UNILAT W OR W/O PELVIS 2-3 VIEWS RIGHT    No orders of the defined types were placed in this encounter.     Procedures: No procedures performed   Clinical Data: No additional findings.   Subjective: Chief Complaint  Patient presents with   Right Knee - Pain    HPI patient is a pleasant 70 year old female who comes in today with chronic right knee pain.  Underlying history of advanced degenerative joint disease.  She has tried cortisone injections as well is viscosupplementation injections most recently which is failed relieve her symptoms.  The pain she has is to the entire  knee worse with activity.  She is taking ibuprofen and Tylenol as well as dobutamine and gabapentin without significant relief.  She has recently seen Dr. Louanne Skye who does not think she is getting referred pain from her back.  Review of Systems as detailed in HPI.  All others reviewed and are negative.   Objective: Vital Signs: There were no vitals taken for this visit.  Physical Exam well-developed well-nourished female no acute distress.  Alert and oriented x3.  Ortho Exam right knee exam shows no effusion.  Range of motion 0 to 120 degrees.  No joint line tenderness.  Negative logroll negative FADIR.  Negative straight leg raise.  She is neurovascular intact distally.  Specialty Comments:  No specialty comments available.  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   Past Medical History:  Diagnosis Date   Arthritis    Depression    Dyslipidemia    Hypertension    Migraine    Migraine without aura, without mention of intractable migraine without mention of status migrainosus 04/13/2014   Parkinson's disease ([place]) 01/24/2021   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   Pre-diabetes    "i was told i was pre-diabetic a while ago"   Tremor 04/13/2014    Family History  Problem Relation Age of Onset   Cancer Father        stomach cancer   Migraines Mother     Past Surgical History:  Procedure  Laterality Date   CHOLECYSTECTOMY     COLONOSCOPY W/ POLYPECTOMY     LUMBAR LAMINECTOMY  07/2020   3 level decompression - [place]   LUMBAR LAMINECTOMY/DECOMPRESSION MICRODISCECTOMY N/A 12/19/2017   Procedure: LAMINECTOMY LUMBAR TWO- LUMBAR THREE, LUMBAR THREE- LUMBAR FOUR, LUMBAR FOUR- LUMBAR FIVE ;  Surgeon: Consuella Lose, MD;  Location: [facility];  Service: Neurosurgery;  Laterality: N/A;   NASAL SINUS SURGERY     TONSILLECTOMY     WISDOM TOOTH EXTRACTION     Social History   Occupational History   Occupation: Retired  Tobacco Use   Smoking status: Never   Smokeless tobacco: Never  Vaping Use   Vaping Use: Never used  Substance and Sexual Activity   Alcohol use: Yes    Alcohol/week: 0.0 standard drinks    Comment: occas.   Drug use: No   Sexual activity: Not on file

## 2021-04-24 NOTE — Addendum Note (Signed)
Addended by: Azucena Cecil on: 04/24/2021 04:41 PM   Modules accepted: Orders

## 2021-04-25 LAB — HEMOGLOBIN A1C
Hgb A1c MFr Bld: 6 % of total Hgb — ABNORMAL HIGH (ref <5.7)
Mean Plasma Glucose: 126 mg/dL
eAG (mmol/L): 7 mmol/L

## 2021-04-25 NOTE — Progress Notes (Signed)
Kristin Gilmore - 70 y.o. female MRN [id number]  Date of birth: 06-09-1951  Office Visit Note: Visit Date: 04/23/2021 PCP: Aletha Halim., PA-C Referred by: Aletha Halim., PA-C  Subjective: Chief Complaint  Patient presents with   Lower Back - Pain   Right Hip - Pain   Right Thigh - Pain   HPI:  Kristin Gilmore is a 70 y.o. female who comes in today At the request of Dr. [name] for right-sided facet joint/medial branch blocks at L2-3, L3-4 and L4-5 and L5-S1 followed by left-sided facet blocks with consideration for radiofrequency ablation.  Again, from a procedural matter the standard is to complete bilateral medial branch blocks and a double block paradigm to be able to consider for radiofrequency ablation.  Relief from both sets of blocks need to be more than 50%.  Insurance will only cover at most 3 levels at 1 sitting.  Therefore today we will complete bilateral L3-4 L3-4 and L4-5 and L5-S1 medial branch blocks based on the symptoms of the patient and the imaging.  If she does not get good relief then I will have her follow-up with Dr. [name] for further management.  Unfortunately the patient has seen pain management physicians and spine surgeons and neurosurgeons really with no relief over the last 20+ years.  She is very nervous about the injection today and we went over this at great length with her and she does want to proceed.  ROS Otherwise per HPI.  Assessment & Plan: Visit Diagnoses:    ICD-10-CM   1. Spondylosis without myelopathy or radiculopathy, lumbar region  M47.816 XR C-ARM NO REPORT    Facet Injection    bupivacaine (MARCAINE) 0.5 % (with pres) injection 3 mL      Plan: No additional findings.   Meds & Orders:  Meds ordered this encounter  Medications   bupivacaine (MARCAINE) 0.5 % (with pres) injection 3 mL    Orders Placed This Encounter  Procedures   Facet Injection   XR C-ARM NO REPORT    Follow-up: Return for Review Pain  Diary.   Procedures: No procedures performed  Lumbar Diagnostic Facet Joint Nerve Block with Fluoroscopic Guidance   Patient: Kristin Gilmore      Date of Birth: 01-23-1951 MRN: [id number] PCP: Aletha Halim., PA-C      Visit Date: 04/23/2021   Universal Protocol:    Date/Time: 07/27/226:42 AM  Consent Given By: the patient  Position: PRONE  Additional Comments: Vital signs were monitored before and after the procedure. Patient was prepped and draped in the usual sterile fashion. The correct patient, procedure, and site was verified.   Injection Procedure Details:   Procedure diagnoses:  1. Spondylosis without myelopathy or radiculopathy, lumbar region      Meds Administered:  Meds ordered this encounter  Medications   bupivacaine (MARCAINE) 0.5 % (with pres) injection 3 mL     Laterality: Bilateral  Location/Site: L3-L4, L2 and L3 medial branches, L4-L5, L3 and L4 medial branches, and L5-S1, L4 medial branch and L5 dorsal ramus  Needle: 5.0 in., 25 ga.  Short bevel or Quincke spinal needle  Needle Placement: Oblique pedical  Findings:   -Comments: There was excellent flow of contrast along the articular pillars without intravascular flow.  Procedure Details: The fluoroscope beam is vertically oriented in AP and then obliqued 15 to 20 degrees to the ipsilateral side of the desired nerve to achieve the "Scotty dog" appearance.  The skin  over the target area of the junction of the superior articulating process and the transverse process (sacral ala if blocking the L5 dorsal rami) was locally anesthetized with a 1 ml volume of 1% Lidocaine without Epinephrine.  The spinal needle was inserted and advanced in a trajectory view down to the target.   After contact with periosteum and negative aspirate for blood and CSF, correct placement without intravascular or epidural spread was confirmed by injecting 0.5 ml. of Isovue-250.  A spot radiograph was obtained of this  image.    Next, a 0.5 ml. volume of the injectate described above was injected. The needle was then redirected to the other facet joint nerves mentioned above if needed.  Prior to the procedure, the patient was given a Pain Diary which was completed for baseline measurements.  After the procedure, the patient rated their pain every 30 minutes and will continue rating at this frequency for a total of 5 hours.  The patient has been asked to complete the Diary and return to [place] by mail, fax or hand delivered as soon as possible.   Additional Comments:  The patient tolerated the procedure well Dressing: 2 x 2 sterile gauze and Band-Aid    Post-procedure details: Patient was observed during the procedure. Post-procedure instructions were reviewed.  Patient left the clinic in stable condition.   Clinical History: No specialty comments available.     Objective:  VS:  HT:    WT:   BMI:     BP:(!) 129/46  HR:(!) 102bpm  TEMP: ( )  RESP:  Physical Exam Vitals and nursing note reviewed.  Constitutional:      General: She is not in acute distress.    Appearance: Normal appearance. She is not ill-appearing.  HENT:     Head: Normocephalic and atraumatic.     Right Ear: External ear normal.     Left Ear: External ear normal.  Eyes:     Extraocular Movements: Extraocular movements intact.  Cardiovascular:     Rate and Rhythm: Normal rate.     Pulses: Normal pulses.  Pulmonary:     Effort: Pulmonary effort is normal. No respiratory distress.  Abdominal:     General: There is no distension.     Palpations: Abdomen is soft.  Musculoskeletal:        General: Tenderness present.     Cervical back: Neck supple.     Right lower leg: No edema.     Left lower leg: No edema.     Comments: Patient has good distal strength with no pain over the greater trochanters.  No clonus or focal weakness.  She ambulates with a cane.  She is slow to rise from a seated position walks with a wide-based  gait.  Skin:    Findings: No erythema, lesion or rash.  Neurological:     General: No focal deficit present.     Mental Status: She is alert and oriented to person, place, and time.     Sensory: No sensory deficit.     Motor: No weakness or abnormal muscle tone.     Coordination: Coordination normal.     Comments: Significant upper extremity and body tremor  Psychiatric:        Mood and Affect: Mood normal.        Behavior: Behavior normal.     Imaging: XR HIP UNILAT W OR W/O PELVIS 2-3 VIEWS RIGHT  Result Date: 04/24/2021 Small irregularity around the greater trochanter likely secondary  to chronic abductor tendinosis

## 2021-04-25 NOTE — Procedures (Signed)
Lumbar Diagnostic Facet Joint Nerve Block with Fluoroscopic Guidance   Patient: Kristin Gilmore      Date of Birth: 21-Jul-1951 MRN: [id number] PCP: Aletha Halim., PA-C      Visit Date: 04/23/2021   Universal Protocol:    Date/Time: 07/27/226:42 AM  Consent Given By: the patient  Position: PRONE  Additional Comments: Vital signs were monitored before and after the procedure. Patient was prepped and draped in the usual sterile fashion. The correct patient, procedure, and site was verified.   Injection Procedure Details:   Procedure diagnoses:  1. Spondylosis without myelopathy or radiculopathy, lumbar region      Meds Administered:  Meds ordered this encounter  Medications   bupivacaine (MARCAINE) 0.5 % (with pres) injection 3 mL     Laterality: Bilateral  Location/Site: L3-L4, L2 and L3 medial branches, L4-L5, L3 and L4 medial branches, and L5-S1, L4 medial branch and L5 dorsal ramus  Needle: 5.0 in., 25 ga.  Short bevel or Quincke spinal needle  Needle Placement: Oblique pedical  Findings:   -Comments: There was excellent flow of contrast along the articular pillars without intravascular flow.  Procedure Details: The fluoroscope beam is vertically oriented in AP and then obliqued 15 to 20 degrees to the ipsilateral side of the desired nerve to achieve the "Scotty dog" appearance.  The skin over the target area of the junction of the superior articulating process and the transverse process (sacral ala if blocking the L5 dorsal rami) was locally anesthetized with a 1 ml volume of 1% Lidocaine without Epinephrine.  The spinal needle was inserted and advanced in a trajectory view down to the target.   After contact with periosteum and negative aspirate for blood and CSF, correct placement without intravascular or epidural spread was confirmed by injecting 0.5 ml. of Isovue-250.  A spot radiograph was obtained of this image.    Next, a 0.5 ml. volume of the  injectate described above was injected. The needle was then redirected to the other facet joint nerves mentioned above if needed.  Prior to the procedure, the patient was given a Pain Diary which was completed for baseline measurements.  After the procedure, the patient rated their pain every 30 minutes and will continue rating at this frequency for a total of 5 hours.  The patient has been asked to complete the Diary and return to [place] by mail, fax or hand delivered as soon as possible.   Additional Comments:  The patient tolerated the procedure well Dressing: 2 x 2 sterile gauze and Band-Aid    Post-procedure details: Patient was observed during the procedure. Post-procedure instructions were reviewed.  Patient left the clinic in stable condition.

## 2021-04-26 ENCOUNTER — Telehealth: Payer: Self-pay

## 2021-04-26 ENCOUNTER — Ambulatory Visit: Payer: Medicare Other | Admitting: Specialist

## 2021-04-26 NOTE — Telephone Encounter (Signed)
Patient called she is requesting to be worked into Dr.Nitkas schedule for a sooner appointment, patient is scheduled 9/8 for a  f/u visit from a injection she received from [facility] call back:[phone]

## 2021-04-27 ENCOUNTER — Telehealth: Payer: Self-pay | Admitting: Specialist

## 2021-04-27 NOTE — Telephone Encounter (Signed)
Pt called and states she was suppose to order CBD capsules on [facility] and she can't find them. She would like christy to give hear call. CB [phone]

## 2021-04-27 NOTE — Telephone Encounter (Signed)
I called and advised that I did not have anything sooner and advised that I had out her on my cancellation list and that I would call if something opened up sooner

## 2021-05-02 ENCOUNTER — Telehealth: Payer: Self-pay | Admitting: Neurology

## 2021-05-02 MED ORDER — DEXAMETHASONE 2 MG PO TABS: ORAL_TABLET | ORAL | 0 refills | Status: DC

## 2021-05-02 NOTE — Addendum Note (Signed)
Addended by: Kathrynn Ducking on: 05/02/2021 04:50 PM   Modules accepted: Orders

## 2021-05-02 NOTE — Telephone Encounter (Signed)
I called the patient.  The patient was doing well with her headaches having only occasional headaches until 1 week ago.  Patient now is having daily headaches.  She states that she is under some stress, she had to put down her 71 year old Cocker spaniel.  I will send in a prescription for Decadron for 3-day taper.  She will call me if she does not get improvement.

## 2021-05-02 NOTE — Telephone Encounter (Signed)
Mailed her a print out from [facility] as to what he recommends

## 2021-05-02 NOTE — Telephone Encounter (Signed)
Pt called, having headaches, goes away when taking SUMAtriptan (IMITREX) 50 MG tablet. But I can not take it often enough. Would like a call from the nurse.

## 2021-05-03 MED ORDER — ALPRAZOLAM 1 MG PO TABS: 1.0000 mg | ORAL_TABLET | Freq: Every evening | ORAL | 0 refills | Status: DC | PRN

## 2021-05-03 NOTE — Telephone Encounter (Signed)
I called the patient.  The patient cannot sleep on the Decadron, I will give her low-dose alprazolam to take for the next several days if needed.  She is to take the Decadron earlier in the day, not in the evening.

## 2021-05-03 NOTE — Telephone Encounter (Signed)
Pt has called to report that she had difficulty getting to sleep as a result of the dexamethasone (DECADRON) 2 MG tablet

## 2021-05-03 NOTE — Addendum Note (Signed)
Addended by: Kathrynn Ducking on: 05/03/2021 01:24 PM   Modules accepted: Orders

## 2021-05-07 ENCOUNTER — Ambulatory Visit: Payer: Medicare Other | Admitting: Physical Therapy

## 2021-05-07 MED ORDER — TIZANIDINE HCL 2 MG PO TABS: 2.0000 mg | ORAL_TABLET | Freq: Three times a day (TID) | ORAL | 1 refills | Status: DC

## 2021-05-07 MED ORDER — DICLOFENAC POTASSIUM 50 MG PO TABS: 50.0000 mg | ORAL_TABLET | Freq: Three times a day (TID) | ORAL | 1 refills | Status: DC

## 2021-05-07 NOTE — Telephone Encounter (Signed)
I called the patient.  The steroid taper did not help.  The patient cannot take Thorazine as she has Parkinson's disease, and Depacon is backordered.  I will try a 5-day course of scheduled diclofenac potassium 50 mg 3 times daily with food, and tizanidine 2 mg 3 times daily for 5 days.

## 2021-05-07 NOTE — Addendum Note (Signed)
Addended by: Kathrynn Ducking on: 05/07/2021 04:28 PM   Modules accepted: Orders

## 2021-05-07 NOTE — Telephone Encounter (Signed)
Pt has called to report that after the 3 day steroid she is still having headaches all day everyday.  Pt is asking for a call as to what other options are available to her.

## 2021-05-08 ENCOUNTER — Telehealth: Payer: Self-pay | Admitting: Orthopaedic Surgery

## 2021-05-08 NOTE — Telephone Encounter (Signed)
Patient called. Says blood work was done and she would like to know the results from the blood work. Her call back number is [phone]

## 2021-05-08 NOTE — Telephone Encounter (Signed)
Aic was 6.0.  ok for surgery.  Kathlee Nations, can you let her know results and debbie can you call her to schedule if you have not already

## 2021-05-09 NOTE — Telephone Encounter (Signed)
I called Home number --sounded like it was a fax. Called mobile number--went straight to VM. LMOM.

## 2021-05-10 ENCOUNTER — Other Ambulatory Visit: Payer: Self-pay | Admitting: Physician Assistant

## 2021-05-14 ENCOUNTER — Other Ambulatory Visit: Payer: Self-pay | Admitting: Psychiatry

## 2021-05-14 ENCOUNTER — Telehealth: Payer: Self-pay | Admitting: Neurology

## 2021-05-14 DIAGNOSIS — F3171 Bipolar disorder, in partial remission, most recent episode hypomanic: Secondary | ICD-10-CM

## 2021-05-14 NOTE — Telephone Encounter (Signed)
Give 1 month supply at time.  Put pt on cancellation list bc not been seen in 2 years.

## 2021-05-14 NOTE — Telephone Encounter (Signed)
Called patient who stated she has had no reduction in headaches even after taking diclofenac and tizanidine Dr Jannifer Franklin prescribed recently. She reports daily headaches. She was unable to give details as she was on her way out to dental appointment, will be home after 5:30 today. She asked that she be called back and message left. I advised her Dr Jannifer Franklin is out of office until Wed, will send to work in [place] . Patient verbalized understanding, appreciation.

## 2021-05-14 NOTE — Telephone Encounter (Signed)
Pt called stating that the diclofenac (CATAFLAM) 50 MG tablet & tiZANidine (ZANAFLEX) 2 MG tablet are not helping her. She is still having really bad headaches. Pt is requesting a call back.

## 2021-05-15 ENCOUNTER — Telehealth: Payer: Self-pay

## 2021-05-15 MED ORDER — TRAMADOL-ACETAMINOPHEN 37.5-325 MG PO TABS: 1.0000 | ORAL_TABLET | Freq: Four times a day (QID) | ORAL | 0 refills | Status: AC | PRN

## 2021-05-15 NOTE — Telephone Encounter (Signed)
Spoke with patient and informed her Dr Brett Fairy sent in Rx for tramadol with tylenol.  She asked if she could still take tylenol with it. I advised she can take 1 tylenol with 1 tramadol. She stated she has a bottle of tramadol 50 mg herself, but she hasn't taken any. She said so far she doesn't have a headache today, will not take tramadol if she doesn't develop headache. Patient verbalized understanding, appreciation.

## 2021-05-15 NOTE — Telephone Encounter (Signed)
Dr Jannifer Franklin not in office-  The patient may need tramadol to overcome this acute headache, to break the cycle. I will make sure she has 6 tabs available, can take these every 6 hours for stated purpose.  CD

## 2021-05-15 NOTE — Addendum Note (Signed)
Addended by: Larey Seat on: 05/15/2021 12:36 PM   Modules accepted: Orders

## 2021-05-15 NOTE — Telephone Encounter (Signed)
Husband states he can't find the CBD that Dr. Louanne Skye spoke of and he would like a call back [phone]

## 2021-05-16 ENCOUNTER — Ambulatory Visit: Payer: Medicare Other | Attending: Specialist | Admitting: Physical Therapy

## 2021-05-16 ENCOUNTER — Encounter: Payer: Self-pay | Admitting: Physical Therapy

## 2021-05-16 ENCOUNTER — Telehealth: Payer: Self-pay | Admitting: Orthopaedic Surgery

## 2021-05-16 ENCOUNTER — Other Ambulatory Visit: Payer: Self-pay

## 2021-05-16 DIAGNOSIS — G8929 Other chronic pain: Secondary | ICD-10-CM | POA: Insufficient documentation

## 2021-05-16 DIAGNOSIS — M545 Low back pain, unspecified: Secondary | ICD-10-CM | POA: Diagnosis present

## 2021-05-16 DIAGNOSIS — R252 Cramp and spasm: Secondary | ICD-10-CM | POA: Diagnosis present

## 2021-05-16 DIAGNOSIS — M6281 Muscle weakness (generalized): Secondary | ICD-10-CM | POA: Insufficient documentation

## 2021-05-16 DIAGNOSIS — M25561 Pain in right knee: Secondary | ICD-10-CM | POA: Diagnosis present

## 2021-05-16 DIAGNOSIS — R293 Abnormal posture: Secondary | ICD-10-CM

## 2021-05-16 DIAGNOSIS — R262 Difficulty in walking, not elsewhere classified: Secondary | ICD-10-CM

## 2021-05-16 DIAGNOSIS — M25552 Pain in left hip: Secondary | ICD-10-CM | POA: Diagnosis present

## 2021-05-16 NOTE — Patient Instructions (Signed)
Access Code: [email or web address] URL: [email or web address] Date: 05/16/2021 Prepared by: Everardo All  Exercises Seated Thoracic Lumbar Extension - 1 x daily - 7 x weekly - 1 sets - 10 reps - 5-10s hold Hooklying Single Knee to Chest Stretch - 1 x daily - 7 x weekly - 1 sets - 2 reps - 20s hold Supine Lower Trunk Rotation - 1 x daily - 7 x weekly - 1 sets - 10 reps - 5-10s hold

## 2021-05-16 NOTE — Telephone Encounter (Signed)
I called and spoke with Mr. Simard, he states that the picture of what I sent them is not CBD that Dr. Louanne Skye recommended, I advised that this is what comes up when searching CBD [employer] on [employer] site that with in the strength that Dr. Louanne Skye recommends, he states that the nutritionist says that it should not be taken in large quantities. I advised that they can further discuss it with Dr. Louanne Skye when they come back in on 06/07/21. I advised that this is not something that she is required to take or try, that it was a recommendation.

## 2021-05-16 NOTE — Telephone Encounter (Signed)
Pt called and states that she is suppose to have surgery in sept on her knee and has left lots of messages. She wants someone to call her back today.

## 2021-05-16 NOTE — Therapy (Signed)
[facility] [address] [place], [place], [address] Phone: [phone]   Fax:  [phone]  Physical Therapy Evaluation  Patient Details  Name: Kristin Gilmore MRN: [id number] Date of Birth: 1950-11-02 Referring Provider (PT): [name], MD   Encounter Date: 05/16/2021   PT End of Session - 05/16/21 1724     Visit Number 1    Date for PT Re-Evaluation 07/11/21    Authorization Type Medicare    Authorization Time Period KX at 15 visits    Progress Note Due on Visit 10    PT Start Time 1532    PT Stop Time 1619    PT Time Calculation (min) 47 min    Equipment Utilized During Treatment Other (comment)   wheelchair   Activity Tolerance Patient tolerated treatment well    Behavior During Therapy [facility] for tasks assessed/performed             Past Medical History:  Diagnosis Date   Arthritis    Depression    Dyslipidemia    Hypertension    Migraine    Migraine without aura, without mention of intractable migraine without mention of status migrainosus 04/13/2014   Parkinson's disease ([place]) 01/24/2021   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   Pre-diabetes    "i was told i was pre-diabetic a while ago"   Tremor 04/13/2014    Past Surgical History:  Procedure Laterality Date   CHOLECYSTECTOMY     COLONOSCOPY W/ POLYPECTOMY     LUMBAR LAMINECTOMY  07/2020   3 level decompression - [place], [place]   LUMBAR LAMINECTOMY/DECOMPRESSION MICRODISCECTOMY N/A 12/19/2017   Procedure: LAMINECTOMY LUMBAR TWO- LUMBAR THREE, LUMBAR THREE- LUMBAR FOUR, LUMBAR FOUR- LUMBAR FIVE ;  Surgeon: Consuella Lose, MD;  Location: [facility];  Service: Neurosurgery;  Laterality: N/A;   NASAL SINUS SURGERY     TONSILLECTOMY     WISDOM TOOTH EXTRACTION      There were no vitals filed for this visit.    Subjective Assessment - 05/16/21 1538     Subjective Patient presenting due to low back pain and Rt knee pain. Underwent x4 lumbar  laminectomy surgeries at a facility in [place] 07/2020. Patient reports that Rt knee is bone on bone and that TKA is planned next month. Reports that she was informed not to perform exercises for Rt knee.    Limitations Standing    How long can you sit comfortably? unlimited    How long can you stand comfortably? 5 minutes    How long can you walk comfortably? 5 minutes due to Rt knee pain    Currently in Pain? No/denies    Multiple Pain Sites Yes    Pain Score 5   12/10 pain with ambulation   Pain Location Knee    Pain Orientation Right    Pain Descriptors / Indicators Aching    Pain Type Chronic pain    Pain Onset More than a month ago    Pain Frequency Intermittent    Aggravating Factors  standing, walking    Pain Relieving Factors rest                [facility] PT Assessment - 05/16/21 0001       Assessment   Medical Diagnosis M51.36 (ICD-10-CM) - DDD (degenerative disc disease), lumbar  M17.11 (ICD-10-CM) - Unilateral primary osteoarthritis, right knee  M47.26 (ICD-10-CM) - Other spondylosis with radiculopathy, lumbar region  M43.16 (ICD-10-CM) - Spondylolisthesis, lumbar region  Referring Provider (PT) [name], MD      Precautions   Precautions None      Restrictions   Weight Bearing Restrictions No      Balance Screen   Has the patient fallen in the past 6 months Yes    How many times? 2    Has the patient had a decrease in activity level because of a fear of falling?  Yes    Is the patient reluctant to leave their home because of a fear of falling?  Yes      [place] Private residence    Living Arrangements Spouse/significant other    Home Layout Two level   has chair lift for stairs   Home Equipment --   chair rail for stairs     Prior Function   Level of Independence Independent    Leisure TV, reading      Cognition   Overall Cognitive Status Within Functional Limits for tasks assessed      Observation/Other Assessments    Focus on Therapeutic Outcomes (FOTO)  37 (goal 47) lumbar      Posture/Postural Control   Posture/Postural Control Postural limitations    Postural Limitations Forward head;Flexed trunk;Decreased lumbar lordosis      ROM / Strength   AROM / PROM / Strength Strength      Strength   Strength Assessment Site Hip;Knee;Ankle    Right/Left Hip Right;Left    Right Hip Flexion 4/5    Right Hip External Rotation  5/5    Right Hip Internal Rotation 5/5    Right Hip ABduction 4/5    Right Hip ADduction 5/5    Left Hip Flexion 4/5    Left Hip External Rotation 4/5    Left Hip Internal Rotation 4/5    Left Hip ABduction 4/5    Left Hip ADduction 5/5    Right/Left Knee Right;Left    Right Knee Flexion 5/5    Right Knee Extension 5/5    Left Knee Flexion 4+/5    Left Knee Extension 4+/5    Right Ankle Dorsiflexion 5/5    Left Ankle Dorsiflexion 5/5      Flexibility   Soft Tissue Assessment /Muscle Length yes    Hamstrings impaired bilaterally      Special Tests    Special Tests Lumbar    Lumbar Tests FABER test;Straight Leg Raise      FABER test   findings Negative    Comment Lt/Rt      Straight Leg Raise   Findings Negative    Comment Lt/Rt      Transfers   Five time sit to stand comments  17.14 seconds      Ambulation/Gait   Ambulation/Gait Yes    Ambulation/Gait Assistance 5: Supervision    Ambulation Distance (Feet) 100 Feet   seated recovery at 40 feet   Assistive device Straight cane    Gait Pattern Decreased stance time - right;Decreased step length - right;Decreased hip/knee flexion - right                        Objective measurements completed on examination: See above findings.               PT Education - 05/16/21 1611     Education Details Access Code: [email or web address]    Person(s) Educated Patient    Methods Explanation;Demonstration;Tactile cues;Verbal cues;Handout    Comprehension Verbalized understanding;Returned  demonstration;Verbal cues required;Tactile cues required              PT Short Term Goals - 05/16/21 1718       PT SHORT TERM GOAL #1   Title Patient will be independent with HEP for continued progression at home.    Time 4    Period Weeks    Status New    Target Date 06/13/21      PT SHORT TERM GOAL #2   Title Patient will ambulate x100 feet using SPC without seated recovery for improved activity tolerance.    Baseline 100 feet x1 seated rest at 40 feet    Time 4    Period Weeks    Status New    Target Date 06/13/21               PT Long Term Goals - 05/16/21 1720       PT LONG TERM GOAL #1   Title Patient will be independent with advanced HEP for long term management of symptoms post D/C.    Time 8    Period Weeks    Status New    Target Date 07/11/21      PT LONG TERM GOAL #2   Title Patient will score 47 or higher on FOTO to indicate improved overall function.    Baseline 37    Time 8    Period Weeks    Status New    Target Date 07/11/21      PT LONG TERM GOAL #3   Title Patient will perform 5 times sit to stand in 14 seconds or less with good eccentric control to indicate improved functional mobility.    Baseline 17.14 seconds    Time 8    Period Weeks    Status New    Target Date 07/11/21      PT LONG TERM GOAL #4   Title Patient will perform at least 20 minutes of standing activity with no more than 3/10 knee or back pain to more readily perform meal prep.    Baseline 5 minutes    Time 8    Period Weeks    Status New    Target Date 07/11/21                    Plan - 05/16/21 1702     Clinical Impression Statement Patient is a 70 y/o female referred due to low back pain and Rt knee pain. PMH includes numerous lumbar surgeries, OA, and Parkinson's disease. Patient reported activity limitations include prolonged standing for meal prep and self-care as well as ambulation >5 minutes. Patient demonstrates Lt LE strength impairments  with regards to hip external and internal rotation. Unable to assess lumbar AROM as patient unable to tolerate standing long for this amount of time. Functional mobility impairments apparent as patient requiring 17.14 seconds to complete five times sit to stand. Activity tolerance deficits apparent as well as patient requiring seated rest period to ambulate from waiting room to treatment area (approx. 100 feet). Patient would benefit from skilled therapeutic intervention to address impairments for improved functional mobility and functional activity tolerance.    Personal Factors and Comorbidities Comorbidity 3+    Comorbidities numerous lumbar surgeries, Parkinson's disease, OA    Examination-Activity Limitations Locomotion Level;Stand    Examination-Participation Restrictions Community Activity;Cleaning;Meal Prep    Stability/Clinical Decision Making Evolving/Moderate complexity    Clinical Decision Making Moderate    Rehab Potential Good  PT Frequency 2x / week    PT Duration 8 weeks    PT Treatment/Interventions Aquatic Therapy;ADLs/Self Care Home Management;Moist Heat;Gait training;Stair training;Functional mobility training;Therapeutic activities;Therapeutic exercise;Balance training;Patient/family education;Manual techniques;Dry needling;Taping;Spinal Manipulations;Joint Manipulations    PT Next Visit Plan review HEP; gentle core and glute strengthening; avoid direct knee exercises    PT Home Exercise Plan Access Code: [email or web address]    Consulted and Agree with Plan of Care Patient             Patient will benefit from skilled therapeutic intervention in order to improve the following deficits and impairments:  Decreased activity tolerance, Decreased balance, Decreased endurance, Decreased mobility, Decreased strength, Difficulty walking, Increased muscle spasms, Increased fascial restricitons, Improper body mechanics, Postural dysfunction, Pain  Visit Diagnosis: Abnormal posture - Plan:  PT plan of care cert/re-cert  Chronic low back pain, unspecified back pain laterality, unspecified whether sciatica present - Plan: PT plan of care cert/re-cert  Muscle weakness (generalized) - Plan: PT plan of care cert/re-cert  Difficulty in walking, not elsewhere classified - Plan: PT plan of care cert/re-cert  Cramp and spasm - Plan: PT plan of care cert/re-cert  Chronic pain of right knee - Plan: PT plan of care cert/re-cert     Problem List Patient Active Problem List   Diagnosis Date Noted   Parkinson's disease ([place]) 01/24/2021   Trochanteric bursitis, left hip 01/18/2020   Lumbar spinal stenosis 12/19/2017   Pain in joint, ankle and foot 05/02/2016   Chronic venous insufficiency 05/02/2016   Peripheral edema 11/01/2015   Intractable chronic migraine without aura 11/02/2014   Tremor 04/13/2014   Migraine without aura 04/13/2014   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 [place], [place], [address] Phone: [phone]   Fax:  [phone]  Name: Kristin Gilmore MRN: [id number] Date of Birth: 03-Apr-1951

## 2021-05-17 ENCOUNTER — Other Ambulatory Visit: Payer: Self-pay

## 2021-05-17 NOTE — Telephone Encounter (Signed)
I have called patient several times, even after 5pm but no answer.  I am reluctant to offer a date in a voice mail message because I will need to know immediately if she will accept the date as there are quite a few other totals being scheduled at this time.   We are now in the 3rd week of October.  Please ask patient when is the best time to call and speak with her directly.

## 2021-05-17 NOTE — Telephone Encounter (Signed)
Also sent a mychart msg

## 2021-05-18 ENCOUNTER — Telehealth: Payer: Self-pay | Admitting: Neurology

## 2021-05-18 ENCOUNTER — Telehealth: Payer: Self-pay | Admitting: Orthopaedic Surgery

## 2021-05-18 NOTE — Telephone Encounter (Signed)
Pt states for the last couple of days she has waken up drenched with tremors, she'd like to know if Dr Jannifer Franklin has any suggestions.  In the call pt stated she will just reach out to her PCP.  She will keep her 08-30th appointment

## 2021-05-18 NOTE — Telephone Encounter (Signed)
She called that she was having night sweats and waking up with tremors  Advised to try to increase the trihexyphenidyl to 4 times a day with last dose at bedtime.  She had wanted to speak to Dr. Jannifer Franklin and I told her I would pass the message along to him

## 2021-05-18 NOTE — Telephone Encounter (Signed)
Patient called. She would like to know when her surgery will be. Says she has called and left messages and nobody has called her back. Her call back number is [phone]

## 2021-05-18 NOTE — Telephone Encounter (Signed)
I called the patient, left a message.  The patient apparently had some night sweats, she was on Decadron earlier in the month, but should be off the medication now.  The note indicates that she will be calling her primary care physician which is reasonable, if she does need further assistance through our office, she will call us back.

## 2021-05-20 NOTE — Telephone Encounter (Signed)
I called the patient.  The patient has had night sweats every night for the last week.  She is off the Decadron, there are no other new medications with exception of Ultram.  Opiate medications can cause night sweats, I have recommended that she stop the Ultram to see if this has an effect.  If she continues to have night sweats, further blood work may need to be done, the patient has not yet contacted her primary care physician regarding this.

## 2021-05-20 NOTE — Addendum Note (Signed)
Addended by: Kathrynn Ducking on: 05/20/2021 03:52 PM   Modules accepted: Orders

## 2021-05-21 ENCOUNTER — Telehealth: Payer: Self-pay | Admitting: Neurology

## 2021-05-21 NOTE — Telephone Encounter (Signed)
I called the patient.  Her Parkinson's tremors are keeping her awake.  She took some alprazolam this afternoon and has been sleeping.  I have recommended potentially using Benadryl and we are from 25 to 100 mg in the evening to suppress the tremor and allow her to sleep better.  She will try that tomorrow night.

## 2021-05-21 NOTE — Telephone Encounter (Signed)
Pt called, could you have Dr. Jannifer Franklin to call back, Did not hear the phone.

## 2021-05-21 NOTE — Telephone Encounter (Signed)
I tried to call the patient on 3 occasions this afternoon, unable to reach her, I will try to call her again tomorrow morning.

## 2021-05-21 NOTE — Telephone Encounter (Signed)
Pt called, having tremors so bad can not relax to go to sleep. Would like a call from the nurse.

## 2021-05-22 ENCOUNTER — Encounter: Payer: Medicare Other | Admitting: Physical Therapy

## 2021-05-22 ENCOUNTER — Ambulatory Visit: Payer: Medicare Other | Admitting: Physical Therapy

## 2021-05-22 ENCOUNTER — Telehealth: Payer: Self-pay | Admitting: Neurology

## 2021-05-22 ENCOUNTER — Telehealth: Payer: Self-pay | Admitting: Orthopaedic Surgery

## 2021-05-22 ENCOUNTER — Other Ambulatory Visit: Payer: Self-pay

## 2021-05-22 DIAGNOSIS — R262 Difficulty in walking, not elsewhere classified: Secondary | ICD-10-CM

## 2021-05-22 DIAGNOSIS — G8929 Other chronic pain: Secondary | ICD-10-CM

## 2021-05-22 DIAGNOSIS — R293 Abnormal posture: Secondary | ICD-10-CM | POA: Diagnosis not present

## 2021-05-22 DIAGNOSIS — M25552 Pain in left hip: Secondary | ICD-10-CM

## 2021-05-22 DIAGNOSIS — M6281 Muscle weakness (generalized): Secondary | ICD-10-CM

## 2021-05-22 DIAGNOSIS — M545 Low back pain, unspecified: Secondary | ICD-10-CM

## 2021-05-22 DIAGNOSIS — M25561 Pain in right knee: Secondary | ICD-10-CM

## 2021-05-22 NOTE — Telephone Encounter (Signed)
Left patient voice mail message at the following number: [phone].  I have asked the patient to return my call regarding a surgery date for right total knee with Dr. Erlinda Hong. I spoke with the patient earlier today regarding a surgery date in October.  Patient stated she had been waiting for call to schedule since July.  I have made numerous attempts to reach patient since her clearance was received 04-30-21.  Today she asked that her cell number be removed from her chart and any attempt to reach her should be on her home phone only where she will answer if she is called.

## 2021-05-22 NOTE — Therapy (Signed)
[facility] [address], [place], [place], [address] Phone: [phone]   Fax:  [phone]  Physical Therapy Treatment  Patient Details  Name: Kristin Gilmore MRN: [id number] Date of Birth: Feb 14, 1951 Referring Provider (PT): [name], MD   Encounter Date: 05/22/2021   PT End of Session - 05/22/21 1717     Visit Number 2    Date for PT Re-Evaluation 07/11/21    Authorization Type Medicare    Authorization Time Period KX at 15 visits    Progress Note Due on Visit 10    PT Start Time 1615    PT Stop Time 1700    PT Time Calculation (min) 45 min    Activity Tolerance Patient tolerated treatment well    Behavior During Therapy [facility] for tasks assessed/performed             Past Medical History:  Diagnosis Date   Arthritis    Depression    Dyslipidemia    Hypertension    Migraine    Migraine without aura, without mention of intractable migraine without mention of status migrainosus 04/13/2014   Parkinson's disease ([place]) 01/24/2021   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   Pre-diabetes    "i was told i was pre-diabetic a while ago"   Tremor 04/13/2014    Past Surgical History:  Procedure Laterality Date   CHOLECYSTECTOMY     COLONOSCOPY W/ POLYPECTOMY     LUMBAR LAMINECTOMY  07/2020   3 level decompression - [place]   LUMBAR LAMINECTOMY/DECOMPRESSION MICRODISCECTOMY N/A 12/19/2017   Procedure: LAMINECTOMY LUMBAR TWO- LUMBAR THREE, LUMBAR THREE- LUMBAR FOUR, LUMBAR FOUR- LUMBAR FIVE ;  Surgeon: Consuella Lose, MD;  Location: [facility];  Service: Neurosurgery;  Laterality: N/A;   NASAL SINUS SURGERY     TONSILLECTOMY     WISDOM TOOTH EXTRACTION      There were no vitals filed for this visit.   Subjective Assessment - 05/22/21 1715     Subjective I need to go over the exercises.  I had a headache all day.    Limitations Standing    How long can you sit comfortably? unlimited    How long  can you stand comfortably? 5 minutes    How long can you walk comfortably? 5 minutes due to Rt knee pain    Currently in Pain? Yes    Pain Score 5     Pain Location Hip    Pain Orientation Left    Pain Score 6    Pain Location Knee    Pain Orientation Right    Pain Descriptors / Indicators Aching                               [facility] Adult PT Treatment/Exercise - 05/22/21 0001       Exercises   Exercises Lumbar;Knee/Hip      Lumbar Exercises: Stretches   Lower Trunk Rotation Limitations 10X5 sec holds    Lumbar Stabilization Level 1 Limitations bent knee fallout 10 reps, PT cued core control over pelvic rotation    Other Lumbar Stretch Exercise supine march x 20 reps      Lumbar Exercises: Seated   Sit to Stand 5 reps    Sit to Stand Limitations try to use LEs>UEs, squeeze gluteals    Other Seated Lumbar Exercises thoracic extension in chair 10x5" holds    Other Seated Lumbar Exercises LE march  x 20 reps      Lumbar Exercises: Supine   Clam 10 reps    Clam Limitations Rt/Lt indiv PT cued core for pelvic control    Bent Knee Raise 10 reps    Other Supine Lumbar Exercises hooklying ball squeeze 10x5 sec holds with VC/TC to avoid abdominal bulge                      PT Short Term Goals - 05/16/21 1718       PT SHORT TERM GOAL #1   Title Patient will be independent with HEP for continued progression at home.    Time 4    Period Weeks    Status New    Target Date 06/13/21      PT SHORT TERM GOAL #2   Title Patient will ambulate x100 feet using SPC without seated recovery for improved activity tolerance.    Baseline 100 feet x1 seated rest at 40 feet    Time 4    Period Weeks    Status New    Target Date 06/13/21               PT Long Term Goals - 05/16/21 1720       PT LONG TERM GOAL #1   Title Patient will be independent with advanced HEP for long term management of symptoms post D/C.    Time 8    Period Weeks    Status  New    Target Date 07/11/21      PT LONG TERM GOAL #2   Title Patient will score 47 or higher on FOTO to indicate improved overall function.    Baseline 37    Time 8    Period Weeks    Status New    Target Date 07/11/21      PT LONG TERM GOAL #3   Title Patient will perform 5 times sit to stand in 14 seconds or less with good eccentric control to indicate improved functional mobility.    Baseline 17.14 seconds    Time 8    Period Weeks    Status New    Target Date 07/11/21      PT LONG TERM GOAL #4   Title Patient will perform at least 20 minutes of standing activity with no more than 3/10 knee or back pain to more readily perform meal prep.    Baseline 5 minutes    Time 8    Period Weeks    Status New    Target Date 07/11/21                   Plan - 05/22/21 1717     Clinical Impression Statement Pt arrived with request to review HEP.  She had a blend of exercise handouts from last visit and from a previous episode of care at this facility, so PT reviewed ther ex and added exercises into same program to combine.  Pt needed cueing to slow down and focus on the purpose of the exercise.  She has poor awareness of how to active her core (bears down and bulges) and control her lumbopelvic region.  Her husband was present to observe per Pt request so he could help her at home with her HEP cues.  PT encouraged Pt to try a few reps a day of sit to stand in addition to her current HEP to work on functional transfer and strength.  She is not to  do direct knee ther ex per her knee doctor so PT encouraged her to try only a few reps for function.  Pt will benefit from advancement of ther ex to seated posture trunk strength next visit.    Comorbidities numerous lumbar surgeries, Parkinson's disease, OA    PT Frequency 2x / week    PT Duration 8 weeks    PT Treatment/Interventions Aquatic Therapy;ADLs/Self Care Home Management;Moist Heat;Gait training;Stair training;Functional mobility  training;Therapeutic activities;Therapeutic exercise;Balance training;Patient/family education;Manual techniques;Dry needling;Taping;Spinal Manipulations;Joint Manipulations    PT Next Visit Plan review HEP as needed, Pt anxious to do more - PT recommends seated UE and trunk exercise next visit, NO DIRECT KNEE THER EX PER HER MD    PT Home Exercise Plan Access Code: [email or web address]    Recommended Other Services NO DIRECT KNEE THER EX PER PT'S MD    Consulted and Agree with Plan of Care Patient             Patient will benefit from skilled therapeutic intervention in order to improve the following deficits and impairments:     Visit Diagnosis: Abnormal posture  Chronic low back pain, unspecified back pain laterality, unspecified whether sciatica present  Muscle weakness (generalized)  Difficulty in walking, not elsewhere classified  Chronic pain of right knee  Pain in left hip     Problem List Patient Active Problem List   Diagnosis Date Noted   Parkinson's disease ([place]) 01/24/2021   Trochanteric bursitis, left hip 01/18/2020   Lumbar spinal stenosis 12/19/2017   Pain in joint, ankle and foot 05/02/2016   Chronic venous insufficiency 05/02/2016   Peripheral edema 11/01/2015   Intractable chronic migraine without aura 11/02/2014   Tremor 04/13/2014   Migraine without aura 04/13/2014   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 [place], [place], [address] Phone: [phone]   Fax:  [phone]  Name: Kristin Gilmore MRN: [id number] Date of Birth: 01/19/1951

## 2021-05-22 NOTE — Telephone Encounter (Signed)
Called patient who stated she's having headaches daily. She took her last sumatriptan today. Today she's taken Tylenol 650 mg this morning and  again at 11:30, took ibuprofen 200 mg at 12 noon. She stated she never got new Rx for diclofenac but has old Rx from . She stated last night she took Alprazolam which helped her sleep.  I advised will send to Dr Jannifer Franklin for recommendations. Patient verbalized understanding, appreciation.

## 2021-05-22 NOTE — Telephone Encounter (Signed)
I called again, left another message, I will call back tomorrow.

## 2021-05-22 NOTE — Patient Instructions (Signed)
Access Code: [email or web address] URL: [email or web address] Date: 05/22/2021 Prepared by: Venetia Night Saketh Daubert  Exercises Seated Thoracic Lumbar Extension - 1 x daily - 7 x weekly - 1 sets - 10 reps - 5-10s hold Hooklying Single Knee to Chest Stretch - 1 x daily - 7 x weekly - 1 sets - 2 reps - 20s hold Supine Lower Trunk Rotation - 1 x daily - 7 x weekly - 1 sets - 10 reps - 5-10s hold Bent Knee Fallouts - 1 x daily - 7 x weekly - 3 sets - 10 reps Seated March - 1 x daily - 7 x weekly - 2 sets - 20 reps Supine Hip Adduction Isometric with Ball - 1 x daily - 7 x weekly - 10 reps Sit to Stand with Armchair - 3 x daily - 7 x weekly - 1 sets - 5 reps

## 2021-05-22 NOTE — Telephone Encounter (Signed)
I called the patient, left a message, I will call back later. 

## 2021-05-22 NOTE — Telephone Encounter (Signed)
Pt called stating that she is still having horrible headaches and is wanting to discuss with the RN or provider. She states that in the last 2 days she has not experienced the normal sweating. Please advise.

## 2021-05-23 ENCOUNTER — Telehealth (INDEPENDENT_AMBULATORY_CARE_PROVIDER_SITE_OTHER): Payer: Medicare Other | Admitting: Psychiatry

## 2021-05-23 ENCOUNTER — Encounter: Payer: Self-pay | Admitting: Psychiatry

## 2021-05-23 DIAGNOSIS — F3110 Bipolar disorder, current episode manic without psychotic features, unspecified: Secondary | ICD-10-CM | POA: Diagnosis not present

## 2021-05-23 DIAGNOSIS — F5105 Insomnia due to other mental disorder: Secondary | ICD-10-CM

## 2021-05-23 MED ORDER — TRIHEXYPHENIDYL HCL 2 MG PO TABS: 2.0000 mg | ORAL_TABLET | Freq: Two times a day (BID) | ORAL | 3 refills | Status: DC

## 2021-05-23 MED ORDER — AMITRIPTYLINE HCL 10 MG PO TABS: 30.0000 mg | ORAL_TABLET | Freq: Every day | ORAL | 1 refills | Status: DC

## 2021-05-23 NOTE — Progress Notes (Signed)
Kristin Gilmore [id number] 1951/01/29 70 y.o.  Virtual Visit via Telephone Note  I connected with@ on 05/23/21 at  4:00 PM EDT by telephone and verified that I am speaking with the correct person using two identifiers.   I discussed the limitations, risks, security and privacy concerns of performing an evaluation and management service by telephone and the availability of in person appointments. I also discussed with the patient that there may be a patient responsible charge related to this service. The patient expressed understanding and agreed to proceed.   I discussed the assessment and treatment plan with the patient. The patient was provided an opportunity to ask questions and all were answered. The patient agreed with the plan and demonstrated an understanding of the instructions.   The patient was advised to call back or seek an in-person evaluation if the symptoms worsen or if the condition fails to improve as anticipated.  I provided 15 minutes of non-face-to-face time during this encounter.  The patient was located at home.  The provider was located at [place]. Call from 4-430 pm  Purnell Shoemaker, MD   Subjective:   Patient ID:  Kristin Gilmore is a 70 y.o. (DOB 12-26-1950) female.  Chief Complaint:  Chief Complaint  Patient presents with   Follow-up   Manic Behavior   Sleeping Problem    HPI Kristin Gilmore presents for follow-up of bipolar and GAD.  seen October 2018 and July 2020.  She has not kept appointments as recommended or scheduled.  05/23/21 appt noted:  alone and with H on phone Only slept 2-3 hours couldn't get to sleep with a lot on her mind.  Dx PD about a month ago.  A lot or tremors on left side.  DX changed from essential to PD. Main problem is can't sit still, jumpy, driven, always in a hurry.  Impatient and can't wait for anything right now when usually can do so.  Driving H crazy.  Since 23 yo dog died a couple of weeks  ago.  Miss her a lot. Taking amitriptyline for HA. Lost 40# in last 3-4 mos by eating less.  Doctor wants her to stop losing so fast. Been more depressed with stressors and losses.  Worry over D's family.  Patient denies difficulty with sleep initiation or maintenance. Denies appetite disturbance.  Patient reports that energy and motivation have been good.  Patient denies any difficulty with concentration.  Patient denies any suicidal ideation.  Primary stressor is her husband Kristin Gilmore has Parkinson's disease and she has some chronic back pain.  Past Psychiatric Medication Trials: Olanzapine, risperidone, Depakote, Seroquel, lamotrigine, lorazepam, propranolol, paroxetine no response, citalopram no response, amitriptyline, so sertraline Overdose on pills and 29 at 70 years old  Review of Systems:  Review of Systems  Constitutional:  Positive for unexpected weight change.  Musculoskeletal:  Positive for arthralgias, back pain and gait problem.  Neurological:  Positive for headaches. Negative for tremors and weakness.   Medications: I have reviewed the patient's current medications.  Current Outpatient Medications  Medication Sig Dispense Refill   acetaminophen (TYLENOL) 500 MG tablet Take 1,000 mg by mouth 2 (two) times daily.      ALPRAZolam (XANAX) 1 MG tablet Take 1 tablet (1 mg total) by mouth at bedtime as needed for anxiety. 5 tablet 0   amitriptyline (ELAVIL) 10 MG tablet Take 3 tablets (30 mg total) by mouth at bedtime. (Patient taking differently: Take 10 mg by mouth at bedtime.)  270 tablet 1   amLODipine (NORVASC) 5 MG tablet amlodipine 5 mg tablet  TAKE 1 TABLET BY MOUTH TWICE A DAY     baclofen (LIORESAL) 10 MG tablet TAKE 0.5-1 TABLETS (5-10 MG TOTAL) BY MOUTH 3 (THREE) TIMES DAILY AS NEEDED FOR MUSCLE SPASMS. 30 tablet 3   Calcium Citrate (CAL-CITRATE PO) Take 1-2 tablets by mouth 2 (two) times daily. TAKE 1 TABLET IN THE MORNING & TAKE 2 TABLETS WITH SUPPER     carvedilol  (COREG) 25 MG tablet Take a half pill twice per day     Cholecalciferol (VITAMIN D3) 2000 UNITS TABS Take 2,000 Units by mouth daily with supper.      Coenzyme Q10 (COQ10) 100 MG CAPS Take 100 mg by mouth daily with supper.     diclofenac (CATAFLAM) 50 MG tablet Take 1 tablet (50 mg total) by mouth 3 (three) times daily. 15 tablet 1   ferrous sulfate 325 (65 FE) MG EC tablet Take 1 tablet by mouth 2 (two) times daily.     fluticasone (FLONASE) 50 MCG/ACT nasal spray Place 1 spray into both nostrils daily.      gabapentin (NEURONTIN) 300 MG capsule Take 300 mg by mouth every 6 (six) hours as needed.      glimepiride (AMARYL) 2 MG tablet Take 2 mg by mouth every morning.     Glucosamine-Chondroitin (COSAMIN DS PO) Take 3 capsules by mouth daily. 1500 glucosamine     hydrochlorothiazide (HYDRODIURIL) 25 MG tablet Take 25 mg by mouth daily.     loperamide (IMODIUM) 2 MG capsule Take 2-4 mg by mouth 2 (two) times daily as needed for diarrhea or loose stools.     MAGNESIUM SULFATE PO Take 1 tablet by mouth at bedtime.      methylPREDNISolone (MEDROL) 4 MG tablet Take 1 tablet (4 mg total) by mouth daily. 10 tablet 0   nabumetone (RELAFEN) 750 MG tablet TAKE 1 TABLET (750 MG TOTAL) BY MOUTH 2 (TWO) TIMES DAILY AS NEEDED. 60 tablet 6   nystatin cream (MYCOSTATIN) APPLY TO AREA TWICE A DAY     Omega 3 1000 MG CAPS Take 1,000 mg by mouth 2 (two) times daily. SUPER OMEGA-3     OVER THE COUNTER MEDICATION Take 2 capsules by mouth daily with supper. Quick 6 Weight Loss Supplement     Probiotic Product (PROBIOTIC PO) Take 1 capsule by mouth daily. SUPREMA DOPHILUS 5 BILLION CFU     propranolol (INDERAL) 20 MG tablet TAKE 1 TABLET TWICE DAILY X 2 WEEKS, THEN TAKE 2 TABLETS TWICE DAILY 360 tablet 0   QUEtiapine (SEROQUEL) 300 MG tablet TAKE 0.5 TABLETS (150 MG TOTAL) BY MOUTH AT BEDTIME. (Patient taking differently: 1/4 tablet at night) 15 tablet 0   Red Yeast Rice 600 MG CAPS Take 1,200 mg by mouth 2 (two)  times daily.     ROYAL JELLY PO Take 150 mg by mouth daily.     SUMAtriptan (IMITREX) 50 MG tablet 1 AS NEEDED FOR HEADACHE MAY REPEAT IN 2HR IF HEADACHE PERSISTS/RECURS NO MORE THAN 2/DAY OR 4/WK 10 tablet 5   telmisartan (MICARDIS) 80 MG tablet telmisartan 80 mg tablet     tiZANidine (ZANAFLEX) 2 MG tablet Take 1 tablet (2 mg total) by mouth 3 (three) times daily. 15 tablet 1   trihexyphenidyl (ARTANE) 2 MG tablet Take 1 tablet (2 mg total) by mouth 2 (two) times daily with a meal. 60 tablet 3   TURMERIC PO Take 475 mg by  mouth 2 (two) times daily. WITH FOOD     vitamin E 400 UNIT capsule Take 400 Units by mouth daily with supper.      No current facility-administered medications for this visit.    Medication Side Effects: None  Allergies:  Allergies  Allergen Reactions   Amoxicillin Hives    UNSPECIFIED REACTION  Has patient had a PCN reaction causing immediate rash, facial/tongue/throat swelling, SOB or lightheadedness with hypotension: Unknown Has patient had a PCN reaction causing severe rash involving mucus membranes or skin necrosis: Unknown Has patient had a PCN reaction that required hospitalization: Unknown Has patient had a PCN reaction occurring within the last 10 years: No If all of the above answers are "NO", then may proceed with Cephalosporin use.    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   Pre-diabetes    "i was told i was pre-diabetic a while ago"   Tremor 04/13/2014    Family History  Problem Relation Age of Onset   Cancer Father        stomach cancer   Migraines Mother     Social History   Socioeconomic History    Marital status: Married    Spouse name: Kristin Gilmore, Kristin Gilmore   Number of children: 1   Years of education: college   Highest education level: Not on file  Occupational History   Occupation: Retired  Tobacco Use   Smoking status: Never   Smokeless tobacco: Never  Vaping Use   Vaping Use: Never used  Substance and Sexual Activity   Alcohol use: Yes    Alcohol/week: 0.0 standard drinks    Comment: occas.   Drug use: No   Sexual activity: Not on file  Other Topics Concern   Not on file  Social History Narrative   Lives at home, married   Patient does not drink caffeine.   Patient is right handed.   Social Determinants of Health   Financial Resource Strain: Not on file  Food Insecurity: Not on file  Transportation Needs: Not on file  Physical Activity: Not on file  Stress: Not on file  Social Connections: Not on file  Intimate Partner Violence: Not on file    Past Medical History, Surgical history, Social history, and Family history were reviewed and updated as appropriate.   Please see review of systems for further details on the patient's review from today.   Objective:   Physical Exam:  There were no vitals taken for this visit.  Physical Exam Neurological:     Mental Status: She is alert and oriented to person, place, and time.     Cranial Nerves: No dysarthria.  Psychiatric:        Attention and Perception: Attention normal.        Mood and Affect: Mood is anxious and depressed. Affect is not angry or tearful.        Speech: Speech is rapid and pressured.        Behavior: Behavior is cooperative.        Thought Content: Thought content normal. Thought content is not paranoid or delusional. Thought content does not include homicidal or suicidal  ideation. Thought content does not include homicidal or suicidal plan.        Cognition and Memory: Cognition and memory normal.        Judgment: Judgment normal.     Comments: Insight chronically fair to poor. Mixed manic symptoms but  not psychotic.  She has better insight into the fact that she is manic for the last couple of weeks.    Lab Review:     Component Value Date/Time   NA 134 (L) 12/20/2017 0403   NA 142 11/01/2015 1232   K 4.6 12/20/2017 0403   CL 97 (L) 12/20/2017 0403   CO2 25 12/20/2017 0403   GLUCOSE 149 (H) 12/20/2017 0403   BUN 17 12/20/2017 0403   BUN 29 (H) 11/01/2015 1232   CREATININE 0.91 12/20/2017 0403   CALCIUM 8.6 (L) 12/20/2017 0403   PROT 6.5 11/01/2015 1232   ALBUMIN 4.5 11/01/2015 1232   AST 28 11/01/2015 1232   ALT 37 (H) 11/01/2015 1232   ALKPHOS 91 11/01/2015 1232   BILITOT 0.3 11/01/2015 1232   GFRNONAA >60 12/20/2017 0403   GFRAA >60 12/20/2017 0403       Component Value Date/Time   WBC 12.1 (H) 12/20/2017 0403   RBC 4.26 12/20/2017 0403   HGB 13.0 12/20/2017 0403   HGB 12.1 11/01/2015 1232   HCT 39.7 12/20/2017 0403   HCT 38.0 11/01/2015 1232   PLT 243 12/20/2017 0403   PLT 281 11/01/2015 1232   MCV 93.2 12/20/2017 0403   MCV 95 11/01/2015 1232   MCH 30.5 12/20/2017 0403   MCHC 32.7 12/20/2017 0403   RDW 13.1 12/20/2017 0403   RDW 13.6 11/01/2015 1232   LYMPHSABS 2.7 11/05/2015 1503   LYMPHSABS 1.8 11/01/2015 1232   MONOABS 0.6 11/05/2015 1503   EOSABS 0.2 11/05/2015 1503   EOSABS 0.0 11/01/2015 1232   BASOSABS 0.0 11/05/2015 1503   BASOSABS 0.0 11/01/2015 1232    No results found for: POCLITH, LITHIUM   Lab Results  Component Value Date   VALPROATE 113 (H) 05/27/2014     .res Assessment: Plan:    Kristin Gilmore was seen today for follow-up, manic behavior and sleeping problem.  Diagnoses and all orders for this visit:  Bipolar I disorder, most recent episode (or current) manic ([place])  Insomnia due to mental condition Mortimer Fries has a long history of bipolar disorder.  She has poor insight into the diagnosis and is never truly accepted the diagnosis.  She is tended to be chronically hypomanic and irritable leading to relationship problems.  She has been  fairly sensitive to medications and has frequently change meds are all on her own or stop meds on her own and gone for long periods of time without keeping appointments.  This is complicated by she also does not tend to recognize she is hypomanic or manic.  Repeated efforts have been made to educate her about by her bipolar disorder to no avail.  Increase SERoquel from 1/4 to 1/2 of 300 mg tablets for mixed manic sx. Call in a week if not better. We will attempt to use the lowest dose of quetiapine possible because of the new diagnosis of Parkinson's disease and because historically she has been pretty sensitive to medication.  She has had a tendency to develop side effects from antipsychotics easily.  She has refused lithium trials.  Other meds as noted above.    Discussed potential metabolic side effects associated with atypical antipsychotics, as well as potential risk for  movement side effects. Advised pt to contact office if movement side effects occur.   Recommend follow-up in a few weeks. Instructed her to call if she has exacerbation of symptoms sooner.  Her husband is marginally insightful about her bipolar disorder.  Kristin Parents, MD, DFAPA  Please see After Visit Summary for patient specific instructions.  Future Appointments  Date Time Provider [place]  05/29/2021  4:15 PM Kathrynn Ducking, MD [facility] None  05/30/2021  4:15 PM Danie Binder, PT [facility]  06/01/2021  1:45 PM Altamese Dilling, PTA [facility]  06/07/2021  3:15 PM Jessy Oto, MD [facility] None  06/08/2021  1:45 PM Altamese Dilling, PTA [facility]  06/13/2021  3:30 PM Danie Binder, PT [facility]  06/15/2021  1:45 PM Altamese Dilling, PTA [facility]  06/18/2021  4:15 PM Danie Binder, PT [facility]  06/22/2021  1:45 PM Altamese Dilling, PTA [facility]  06/25/2021  4:15 PM Danie Binder, PT [facility]  06/29/2021  1:45 PM Altamese Dilling, PTA [facility]   07/02/2021  3:30 PM Cottle, [place]ley [place]., MD [facility] None    No orders of the defined types were placed in this encounter.     -------------------------------

## 2021-05-23 NOTE — Telephone Encounter (Signed)
Pt returned call. She states she was sleeping when last call came in.

## 2021-05-23 NOTE — Telephone Encounter (Signed)
The patient called, she continues to have significant tremors at night and cannot sleep.  She will cut back on the trihexyphenidyl to 2 mg twice daily, we will increase amitriptyline to 30 mg at night for the headache and for the tremors and to help her sleep.  She has not yet started the diphenhydramine that she can take at night if needed for tremors and sleep.  She continues to have daily headaches, better in the morning, worse as the day goes on.

## 2021-05-23 NOTE — Telephone Encounter (Signed)
I called the patient again, left another message.  If the patient still requires assistance, she is to contact her office and leave a number where she can be reached.

## 2021-05-23 NOTE — Addendum Note (Signed)
Addended by: Kathrynn Ducking on: 05/23/2021 11:06 AM   Modules accepted: Orders

## 2021-05-24 ENCOUNTER — Other Ambulatory Visit: Payer: Self-pay

## 2021-05-29 ENCOUNTER — Encounter: Payer: Self-pay | Admitting: Neurology

## 2021-05-29 ENCOUNTER — Other Ambulatory Visit: Payer: Self-pay

## 2021-05-29 ENCOUNTER — Ambulatory Visit (INDEPENDENT_AMBULATORY_CARE_PROVIDER_SITE_OTHER): Payer: Medicare Other | Admitting: Neurology

## 2021-05-29 VITALS — BP 152/74 | HR 77 | Ht 67.0 in | Wt 165.0 lb

## 2021-05-29 DIAGNOSIS — G20 Parkinson's disease: Secondary | ICD-10-CM | POA: Diagnosis not present

## 2021-05-29 DIAGNOSIS — G43011 Migraine without aura, intractable, with status migrainosus: Secondary | ICD-10-CM | POA: Diagnosis not present

## 2021-05-29 MED ORDER — AIMOVIG 140 MG/ML ~~LOC~~ SOAJ: 140.0000 mg | SUBCUTANEOUS | 4 refills | Status: DC

## 2021-05-29 MED ORDER — AMITRIPTYLINE HCL 10 MG PO TABS: 30.0000 mg | ORAL_TABLET | Freq: Every day | ORAL | 1 refills | Status: DC

## 2021-05-29 NOTE — Progress Notes (Addendum)
Reason for visit: Headache, Parkinson's disease  Kristin Gilmore is an 70 y.o. female  History of present illness:  Kristin Gilmore is a 70 year old right-handed white female with a history of headache that is intractable.  She is having daily headaches.  She is on amitriptyline working up to 30 mg at night, currently on 20 mg at night.  She is on 150 mg of Seroquel, and she has gabapentin.  The patient in the past has had Botox therapies that have not been effective.  She also has tremors on the left greater than right upper extremities and on the left lower extremity, a DaTscan done previously was felt to be consistent with Parkinson's disease.  She has not really had much in the way of mobility issues with exception that she has severe arthritis of the right knee and will be undergoing knee surgery in the near future.  The patient has not had any recent falls, she uses a walker at home mainly because of the knee pain.  She has trouble resting during the day because of the tremors, she sleeps well at night.  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   Pre-diabetes    "i was told i was pre-diabetic a while ago"   Tremor 04/13/2014    Past Surgical History:  Procedure Laterality Date   CHOLECYSTECTOMY     COLONOSCOPY W/ POLYPECTOMY     LUMBAR LAMINECTOMY  07/2020   3 level decompression - [place], [place]   LUMBAR LAMINECTOMY/DECOMPRESSION MICRODISCECTOMY N/A 12/19/2017   Procedure: LAMINECTOMY LUMBAR TWO- LUMBAR THREE, LUMBAR THREE- LUMBAR FOUR, LUMBAR FOUR- LUMBAR FIVE ;  Surgeon: Consuella Lose, MD;  Location: [facility];  Service: Neurosurgery;  Laterality: N/A;   NASAL SINUS SURGERY     TONSILLECTOMY     WISDOM TOOTH  EXTRACTION      Family History  Problem Relation Age of Onset   Cancer Father        stomach cancer   Migraines Mother     Social history:  reports that she has never smoked. She has never used smokeless tobacco. She reports current alcohol use. She reports that she does not use drugs.    Allergies  Allergen Reactions   Amoxicillin Hives    UNSPECIFIED REACTION  Has patient had a PCN reaction causing immediate rash, facial/tongue/throat swelling, SOB or lightheadedness with hypotension: Unknown Has patient had a PCN reaction causing severe rash involving mucus membranes or skin necrosis: Unknown Has patient had a PCN reaction that required hospitalization: Unknown Has patient had a PCN reaction occurring within the last 10 years: No If all of the above answers are "NO", then may proceed with Cephalosporin use.    Clarithromycin Hives, Other (See Comments) and Rash   Atropine Hives    Medications:  Prior to Admission medications   Medication Sig Start Date End Date Taking? Authorizing Provider  acetaminophen (TYLENOL) 500 MG tablet Take 1,000 mg by mouth 2 (two) times daily.     [provider]  ALPRAZolam Duanne Moron) 1 MG tablet Take 1 tablet (1 mg total) by mouth at bedtime as needed for anxiety. 05/03/21   Kathrynn Ducking, MD  amitriptyline (ELAVIL) 10 MG tablet Take 3  tablets (30 mg total) by mouth at bedtime. Patient taking differently: Take 10 mg by mouth at bedtime. 05/23/21   Kathrynn Ducking, MD  amLODipine (NORVASC) 5 MG tablet amlodipine 5 mg tablet  TAKE 1 TABLET BY MOUTH TWICE A DAY 07/27/18   [provider]  baclofen (LIORESAL) 10 MG tablet TAKE 0.5-1 TABLETS (5-10 MG TOTAL) BY MOUTH 3 (THREE) TIMES DAILY AS NEEDED FOR MUSCLE SPASMS. 05/18/20   Hilts, Legrand Como, MD  Calcium Citrate (CAL-CITRATE PO) Take 1-2 tablets by mouth 2 (two) times daily. TAKE 1 TABLET IN THE MORNING & TAKE 2 TABLETS WITH SUPPER    [provider]  carvedilol (COREG) 25 MG  tablet Take a half pill twice per day 07/14/19   [provider]  Cholecalciferol (VITAMIN D3) 2000 UNITS TABS Take 2,000 Units by mouth daily with supper.     [provider]  Coenzyme Q10 (COQ10) 100 MG CAPS Take 100 mg by mouth daily with supper.    [provider]  diclofenac (CATAFLAM) 50 MG tablet Take 1 tablet (50 mg total) by mouth 3 (three) times daily. 05/07/21   Kathrynn Ducking, MD  ferrous sulfate 325 (65 FE) MG EC tablet Take 1 tablet by mouth 2 (two) times daily. 10/05/19   [provider]  fluticasone (FLONASE) 50 MCG/ACT nasal spray Place 1 spray into both nostrils daily.  01/18/17   [provider]  gabapentin (NEURONTIN) 300 MG capsule Take 300 mg by mouth every 6 (six) hours as needed.     [provider]  glimepiride (AMARYL) 2 MG tablet Take 2 mg by mouth every morning. 10/19/19   [provider]  Glucosamine-Chondroitin (COSAMIN DS PO) Take 3 capsules by mouth daily. 1500 glucosamine    [provider]  hydrochlorothiazide (HYDRODIURIL) 25 MG tablet Take 25 mg by mouth daily. 04/10/19   [provider]  loperamide (IMODIUM) 2 MG capsule Take 2-4 mg by mouth 2 (two) times daily as needed for diarrhea or loose stools.    [provider]  MAGNESIUM SULFATE PO Take 1 tablet by mouth at bedtime.     [provider]  methylPREDNISolone (MEDROL) 4 MG tablet Take 1 tablet (4 mg total) by mouth daily. 08/21/20   Jessy Oto, MD  nabumetone (RELAFEN) 750 MG tablet TAKE 1 TABLET (750 MG TOTAL) BY MOUTH 2 (TWO) TIMES DAILY AS NEEDED. 04/27/20   Hilts, Legrand Como, MD  nystatin cream (MYCOSTATIN) APPLY TO AREA TWICE A DAY 08/17/19   [provider]  Omega 3 1000 MG CAPS Take 1,000 mg by mouth 2 (two) times daily. SUPER OMEGA-3    [provider]  OVER THE COUNTER MEDICATION Take 2 capsules by mouth daily with supper. Quick 6 Weight Loss Supplement    [provider]   Probiotic Product (PROBIOTIC PO) Take 1 capsule by mouth daily. SUPREMA DOPHILUS 5 BILLION CFU    [provider]  propranolol (INDERAL) 20 MG tablet TAKE 1 TABLET TWICE DAILY X 2 WEEKS, THEN TAKE 2 TABLETS TWICE DAILY 03/20/21   Suzzanne Cloud, NP  QUEtiapine (SEROQUEL) 300 MG tablet TAKE 0.5 TABLETS (150 MG TOTAL) BY MOUTH AT BEDTIME. Patient taking differently: 1/4 tablet at night 05/14/21   Cottle, Billey Co., MD  Red Yeast Rice 600 MG CAPS Take 1,200 mg by mouth 2 (two) times daily.    [provider]  ROYAL JELLY PO Take 150 mg by mouth daily.    [provider]  SUMAtriptan (IMITREX) 50 MG tablet 1 AS NEEDED FOR HEADACHE MAY REPEAT IN 2HR IF HEADACHE PERSISTS/RECURS NO MORE THAN 2/DAY OR 4/WK 08/30/20   Suzzanne Cloud, NP  telmisartan (MICARDIS) 80 MG tablet telmisartan 80 mg tablet    [provider]  tiZANidine (ZANAFLEX) 2 MG tablet Take 1 tablet (2 mg total) by mouth 3 (three) times daily. 05/07/21   Kathrynn Ducking, MD  trihexyphenidyl (ARTANE) 2 MG tablet Take 1 tablet (2 mg total) by mouth 2 (two) times daily with a meal. 05/23/21   Kathrynn Ducking, MD  TURMERIC PO Take 475 mg by mouth 2 (two) times daily. WITH FOOD    [provider]  vitamin E 400 UNIT capsule Take 400 Units by mouth daily with supper.     [provider]    ROS:  Out of a complete 14 system review of symptoms, the patient complains only of the following symptoms, and all other reviewed systems are negative.  Tremor Knee pain Headache  Blood pressure (!) 152/74, pulse 77, height '5\' 7"'$  (1.702 m), weight 165 lb (74.8 kg).  Physical Exam  General: The patient is alert and cooperative at the time of the examination.  The patient is moderately obese.  Skin: No significant peripheral edema is noted.   Neurologic Exam  Mental status: The patient is alert and oriented x 3 at the time of the examination. The patient has apparent normal recent and remote  memory, with an apparently normal attention span and concentration ability.   Cranial nerves: Facial symmetry is present. Speech is normal, no aphasia or dysarthria is noted. Extraocular movements are full. Visual fields are full.  Motor: The patient has good strength in all 4 extremities.  Sensory examination: Soft touch sensation is symmetric on the face, arms, and legs.  Coordination: The patient has good finger-nose-finger and heel-to-shin bilaterally.  The patient has prominent resting tremors on the left greater than right upper extremity and on the left lower extremity.  Gait and station: The patient is able to arise from a seated position with arms crossed, once up she can walk independently with good arm swing bilaterally but prominent tremor noted on the left arm more so than the right.  She has good turns and good stride.  Romberg is negative.  Reflexes: Deep tendon reflexes are symmetric.   Assessment/Plan:  1.  Parkinson's disease  2.  Intractable headache  The patient is on Artane for the tremor without much benefit.  Her mobility appears to be quite good, I have not yet started Sinemet.  Sinemet is unlikely to help the tremor significantly.  The patient is having ongoing daily headaches.  She is working up on the amitriptyline dose, I will add Aimovig at this point.  She will follow-up in 3 to 4 months.  Jill Alexanders MD 05/29/2021 4:54 PM  [facility] [address] [place], [place] [address]  Phone [phone] Fax [phone]

## 2021-05-30 ENCOUNTER — Encounter: Payer: Self-pay | Admitting: *Deleted

## 2021-05-30 ENCOUNTER — Telehealth: Payer: Self-pay | Admitting: *Deleted

## 2021-05-30 ENCOUNTER — Other Ambulatory Visit: Payer: Self-pay

## 2021-05-30 ENCOUNTER — Ambulatory Visit: Payer: Medicare Other

## 2021-05-30 DIAGNOSIS — M6281 Muscle weakness (generalized): Secondary | ICD-10-CM

## 2021-05-30 DIAGNOSIS — R293 Abnormal posture: Secondary | ICD-10-CM | POA: Diagnosis not present

## 2021-05-30 DIAGNOSIS — G8929 Other chronic pain: Secondary | ICD-10-CM

## 2021-05-30 DIAGNOSIS — R262 Difficulty in walking, not elsewhere classified: Secondary | ICD-10-CM

## 2021-05-30 DIAGNOSIS — M545 Low back pain, unspecified: Secondary | ICD-10-CM

## 2021-05-30 NOTE — Telephone Encounter (Signed)
Aimovig AP, key: [id number], [id number], Failed: tylenol, relafen, amitriptyline, diclofenac, sumatriptan, propranolol. Your information has been sent to [place].

## 2021-05-30 NOTE — Telephone Encounter (Signed)
Aimovig 140 mg approved 05/30/2021 through 05/30/2022. Sent her my chart to inform.

## 2021-05-30 NOTE — Therapy (Signed)
[facility] [address], [place], [place], [address] Phone: [phone]   Fax:  [phone]  Physical Therapy Treatment  Patient Details  Name: Kristin Gilmore MRN: [id number] Date of Birth: May 28, 1951 Referring Provider (PT): [name], MD   Encounter Date: 05/30/2021   PT End of Session - 05/30/21 1654     Visit Number 3    Date for PT Re-Evaluation 07/11/21    Authorization Type Medicare    Authorization Time Period KX at 15 visits    Progress Note Due on Visit 10    PT Start Time 1617    PT Stop Time 1656    PT Time Calculation (min) 39 min    Activity Tolerance Patient tolerated treatment well    Behavior During Therapy [facility] for tasks assessed/performed             Past Medical History:  Diagnosis Date   Arthritis    Depression    Dyslipidemia    Hypertension    Migraine    Migraine without aura, without mention of intractable migraine without mention of status migrainosus 04/13/2014   Parkinson's disease ([place]) 01/24/2021   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   Pre-diabetes    "i was told i was pre-diabetic a while ago"   Tremor 04/13/2014    Past Surgical History:  Procedure Laterality Date   CHOLECYSTECTOMY     COLONOSCOPY W/ POLYPECTOMY     LUMBAR LAMINECTOMY  07/2020   3 level decompression - [place], [place]   LUMBAR LAMINECTOMY/DECOMPRESSION MICRODISCECTOMY N/A 12/19/2017   Procedure: LAMINECTOMY LUMBAR TWO- LUMBAR THREE, LUMBAR THREE- LUMBAR FOUR, LUMBAR FOUR- LUMBAR FIVE ;  Surgeon: Consuella Lose, MD;  Location: [facility];  Service: Neurosurgery;  Laterality: N/A;   NASAL SINUS SURGERY     TONSILLECTOMY     WISDOM TOOTH EXTRACTION      There were no vitals filed for this visit.   Subjective Assessment - 05/30/21 1618     Subjective Some back pain.    Pertinent History L2/3, L3/4, L4/5 and L5/S1 laminectomy    Currently in Pain? Yes    Pain Score 3     Pain Location Knee     Pain Orientation Right    Pain Onset More than a month ago    Pain Frequency Constant                               [facility] Adult PT Treatment/Exercise - 05/30/21 0001       Lumbar Exercises: Stretches   Lower Trunk Rotation Limitations 10X5 sec holds    Lumbar Stabilization Level 1 Limitations bent knee fallout 10 reps, PT cued core control over pelvic rotation    Other Lumbar Stretch Exercise supine march x 20 reps      Lumbar Exercises: Seated   Sit to Stand 5 reps    Sit to Stand Limitations try to use LEs>UEs, squeeze gluteals    Other Seated Lumbar Exercises seated crunch with 5# 2x10    Other Seated Lumbar Exercises LE march x 20 reps      Knee/Hip Exercises: Supine   Other Supine Knee/Hip Exercises shoulder flexion 1# 2x10                      PT Short Term Goals - 05/16/21 1718       PT SHORT TERM GOAL #1   Title  Patient will be independent with HEP for continued progression at home.    Time 4    Period Weeks    Status New    Target Date 06/13/21      PT SHORT TERM GOAL #2   Title Patient will ambulate x100 feet using SPC without seated recovery for improved activity tolerance.    Baseline 100 feet x1 seated rest at 40 feet    Time 4    Period Weeks    Status New    Target Date 06/13/21               PT Long Term Goals - 05/16/21 1720       PT LONG TERM GOAL #1   Title Patient will be independent with advanced HEP for long term management of symptoms post D/C.    Time 8    Period Weeks    Status New    Target Date 07/11/21      PT LONG TERM GOAL #2   Title Patient will score 47 or higher on FOTO to indicate improved overall function.    Baseline 37    Time 8    Period Weeks    Status New    Target Date 07/11/21      PT LONG TERM GOAL #3   Title Patient will perform 5 times sit to stand in 14 seconds or less with good eccentric control to indicate improved functional mobility.    Baseline 17.14 seconds     Time 8    Period Weeks    Status New    Target Date 07/11/21      PT LONG TERM GOAL #4   Title Patient will perform at least 20 minutes of standing activity with no more than 3/10 knee or back pain to more readily perform meal prep.    Baseline 5 minutes    Time 8    Period Weeks    Status New    Target Date 07/11/21                   Plan - 05/30/21 1650     Clinical Impression Statement Pt requested to review HEP and required max verbal cueing for technique.  Pt was able to perform correctly with verbal cues.  Pt needed cueing to slow down and focus on the purpose of the exercise.  She has poor awareness of how to active her core (bears down and bulges) and control her lumbopelvic region.  Pt was not comfortable doing standing extension with band due to feeling unsteady. Pt will have aquatic PT this week.   Pt will benefit from advancement of therex to seated posture trunk strength next visit.    PT Frequency 2x / week    PT Duration 8 weeks    PT Treatment/Interventions Aquatic Therapy;ADLs/Self Care Home Management;Moist Heat;Gait training;Stair training;Functional mobility training;Therapeutic activities;Therapeutic exercise;Balance training;Patient/family education;Manual techniques;Dry needling;Taping;Spinal Manipulations;Joint Manipulations    PT Next Visit Plan seated core strength, no direct knee exercise per MD    PT Home Exercise Plan Access Code: [email or web address]    Consulted and Agree with Plan of Care Patient             Patient will benefit from skilled therapeutic intervention in order to improve the following deficits and impairments:  Decreased activity tolerance, Decreased balance, Decreased endurance, Decreased mobility, Decreased strength, Difficulty walking, Increased muscle spasms, Increased fascial restricitons, Improper body mechanics, Postural dysfunction, Pain  Visit Diagnosis: Abnormal posture  Chronic low back pain, unspecified back pain laterality,  unspecified whether sciatica present  Muscle weakness (generalized)  Chronic pain of right knee  Difficulty in walking, not elsewhere classified     Problem List Patient Active Problem List   Diagnosis Date Noted   Parkinson's disease ([place]) 01/24/2021   Trochanteric bursitis, left hip 01/18/2020   Lumbar spinal stenosis 12/19/2017   Pain in joint, ankle and foot 05/02/2016   Chronic venous insufficiency 05/02/2016   Peripheral edema 11/01/2015   Intractable chronic migraine without aura 11/02/2014   Tremor 04/13/2014   Migraine without aura 04/13/2014   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 [place], [place], [address] Phone: [phone]   Fax:  [phone]  Name: Kristin Gilmore MRN: [id number] Date of Birth: Jun 21, 1951

## 2021-06-01 ENCOUNTER — Ambulatory Visit: Payer: Medicare Other | Attending: Specialist | Admitting: Physical Therapy

## 2021-06-01 ENCOUNTER — Other Ambulatory Visit: Payer: Self-pay

## 2021-06-01 ENCOUNTER — Encounter: Payer: Self-pay | Admitting: Physical Therapy

## 2021-06-01 DIAGNOSIS — M25561 Pain in right knee: Secondary | ICD-10-CM | POA: Diagnosis present

## 2021-06-01 DIAGNOSIS — M545 Low back pain, unspecified: Secondary | ICD-10-CM | POA: Insufficient documentation

## 2021-06-01 DIAGNOSIS — M6281 Muscle weakness (generalized): Secondary | ICD-10-CM | POA: Diagnosis present

## 2021-06-01 DIAGNOSIS — G8929 Other chronic pain: Secondary | ICD-10-CM

## 2021-06-01 DIAGNOSIS — R262 Difficulty in walking, not elsewhere classified: Secondary | ICD-10-CM

## 2021-06-01 DIAGNOSIS — R293 Abnormal posture: Secondary | ICD-10-CM | POA: Diagnosis not present

## 2021-06-01 NOTE — Therapy (Signed)
[facility] [address] [place], [place], [address] Phone: [phone]   Fax:  [phone]  Physical Therapy Treatment  Patient Details  Name: Kristin Gilmore MRN: [id number] Date of Birth: 1950-12-28 Referring Provider (PT): [name], MD   Encounter Date: 06/01/2021   PT End of Session - 06/01/21 1545     Visit Number 4    Date for PT Re-Evaluation 07/11/21    Authorization Type Medicare    Authorization Time Period KX at 15 visits    Progress Note Due on Visit 10    PT Start Time 1330    PT Stop Time 1420    PT Time Calculation (min) 50 min    Activity Tolerance Patient tolerated treatment well    Behavior During Therapy [facility] for tasks assessed/performed             Past Medical History:  Diagnosis Date   Arthritis    Depression    Dyslipidemia    Hypertension    Migraine    Migraine without aura, without mention of intractable migraine without mention of status migrainosus 04/13/2014   Parkinson's disease ([place]) 01/24/2021   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   Pre-diabetes    "i was told i was pre-diabetic a while ago"   Tremor 04/13/2014    Past Surgical History:  Procedure Laterality Date   CHOLECYSTECTOMY     COLONOSCOPY W/ POLYPECTOMY     LUMBAR LAMINECTOMY  07/2020   3 level decompression - [place], [place]   LUMBAR LAMINECTOMY/DECOMPRESSION MICRODISCECTOMY N/A 12/19/2017   Procedure: LAMINECTOMY LUMBAR TWO- LUMBAR THREE, LUMBAR THREE- LUMBAR FOUR, LUMBAR FOUR- LUMBAR FIVE ;  Surgeon: Consuella Lose, MD;  Location: [facility];  Service: Neurosurgery;  Laterality: N/A;   NASAL SINUS SURGERY     TONSILLECTOMY     WISDOM TOOTH EXTRACTION      There were no vitals filed for this visit.   Subjective Assessment - 06/01/21 1330     Subjective 5-6/10 back pain, knee is "bad."    Pertinent History L2/3, L3/4, L4/5 and L5/S1 laminectomy    Currently in Pain? Yes    Pain Score 6     Pain  Location Back    Pain Orientation Right;Lower;Left    Pain Descriptors / Indicators Sore             Treatment:Patient seen for aquatic therapy today.  Treatment took place in water 2.5-4 feet deep depending upon activity.  Pt entered the pool via stairs, step to step secondary to Rt knee pain. Extra time exiting pool. Water temp 94 degrees F.  Seated water bench with 75% submersion Pt performed seated LE AROM exercises 20x in all planes, pain assessment concurrent. Core compressions with the nekadoodle 5 sec hold 2x5, gentle thoracic rotations with large noodle 2x5, UE yellow wts shoulder horizontal abd 2x10, VC for core engagment. Sit to stand 2x5 with UE on large noodle.  Standing in 75% depth Water walking forward with yellow noodle 4 length, requires to sit d/t knee pain, then repeated 2 lengths 2x. RTLE 10x hip circles, flex/ext, and abd with aquajogger ( not helpful in unweighting ) requires UE holding onto side of pool  Decompression float in seated position with yellow noodle behind pt, at depth where pt could dangle LE: 6 min                            PT  Short Term Goals - 05/16/21 1718       PT SHORT TERM GOAL #1   Title Patient will be independent with HEP for continued progression at home.    Time 4    Period Weeks    Status New    Target Date 06/13/21      PT SHORT TERM GOAL #2   Title Patient will ambulate x100 feet using SPC without seated recovery for improved activity tolerance.    Baseline 100 feet x1 seated rest at 40 feet    Time 4    Period Weeks    Status New    Target Date 06/13/21               PT Long Term Goals - 05/16/21 1720       PT LONG TERM GOAL #1   Title Patient will be independent with advanced HEP for long term management of symptoms post D/C.    Time 8    Period Weeks    Status New    Target Date 07/11/21      PT LONG TERM GOAL #2   Title Patient will score 47 or higher on FOTO to indicate improved  overall function.    Baseline 37    Time 8    Period Weeks    Status New    Target Date 07/11/21      PT LONG TERM GOAL #3   Title Patient will perform 5 times sit to stand in 14 seconds or less with good eccentric control to indicate improved functional mobility.    Baseline 17.14 seconds    Time 8    Period Weeks    Status New    Target Date 07/11/21      PT LONG TERM GOAL #4   Title Patient will perform at least 20 minutes of standing activity with no more than 3/10 knee or back pain to more readily perform meal prep.    Baseline 5 minutes    Time 8    Period Weeks    Status New    Target Date 07/11/21                   Plan - 06/01/21 1546     Clinical Impression Statement Pt arrives for first aquatic PT visit for this POC. Pt was seen a few times prior to her back surgeries for aquatic PT. Pt verbally states she enjoys exercising/moving in the water and tends to hurt less. Pt did have RT knee pain intermittently today throughout the sesison but no back pain. Pt is scheduled for TKR at the end of the month. Flotation devices to help [employer] pt's LE were some helpful, mostly when walking in the water.    Personal Factors and Comorbidities Comorbidity 3+    Comorbidities numerous lumbar surgeries, Parkinson's disease, OA    Examination-Activity Limitations Locomotion Level;Stand    Examination-Participation Restrictions Community Activity;Cleaning;Meal Prep    Stability/Clinical Decision Making Evolving/Moderate complexity    Rehab Potential Good    PT Frequency 2x / week    PT Duration 8 weeks    PT Treatment/Interventions Aquatic Therapy;ADLs/Self Care Home Management;Moist Heat;Gait training;Stair training;Functional mobility training;Therapeutic activities;Therapeutic exercise;Balance training;Patient/family education;Manual techniques;Dry needling;Taping;Spinal Manipulations;Joint Manipulations    PT Next Visit Plan seated core strength, no direct knee exercise  per MD    PT Home Exercise Plan Access Code: [email or web address]    Consulted and Agree with Plan of Care Patient  Patient will benefit from skilled therapeutic intervention in order to improve the following deficits and impairments:  Decreased activity tolerance, Decreased balance, Decreased endurance, Decreased mobility, Decreased strength, Difficulty walking, Increased muscle spasms, Increased fascial restricitons, Improper body mechanics, Postural dysfunction, Pain  Visit Diagnosis: Abnormal posture  Chronic low back pain, unspecified back pain laterality, unspecified whether sciatica present  Muscle weakness (generalized)  Chronic pain of right knee  Difficulty in walking, not elsewhere classified     Problem List Patient Active Problem List   Diagnosis Date Noted   Parkinson's disease ([place]) 01/24/2021   Trochanteric bursitis, left hip 01/18/2020   Lumbar spinal stenosis 12/19/2017   Pain in joint, ankle and foot 05/02/2016   Chronic venous insufficiency 05/02/2016   Peripheral edema 11/01/2015   Intractable chronic migraine without aura 11/02/2014   Tremor 04/13/2014   Migraine without aura 04/13/2014   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, [place], [place], [address] Phone: [phone]   Fax:  [phone]  Name: Kristin Gilmore MRN: [id number] Date of Birth: 1951/08/29

## 2021-06-02 ENCOUNTER — Other Ambulatory Visit: Payer: Self-pay | Admitting: Neurology

## 2021-06-06 ENCOUNTER — Emergency Department (HOSPITAL_COMMUNITY)
Admission: EM | Admit: 2021-06-06 | Discharge: 2021-06-06 | Disposition: A | Discharge status: Home / Self Care | Payer: Medicare Other | Attending: Emergency Medicine | Admitting: Emergency Medicine

## 2021-06-06 ENCOUNTER — Encounter (HOSPITAL_COMMUNITY): Payer: Self-pay | Admitting: Oncology

## 2021-06-06 ENCOUNTER — Other Ambulatory Visit: Payer: Self-pay | Admitting: Psychiatry

## 2021-06-06 ENCOUNTER — Other Ambulatory Visit: Payer: Self-pay

## 2021-06-06 DIAGNOSIS — Z79899 Other long term (current) drug therapy: Secondary | ICD-10-CM | POA: Diagnosis not present

## 2021-06-06 DIAGNOSIS — I10 Essential (primary) hypertension: Secondary | ICD-10-CM | POA: Insufficient documentation

## 2021-06-06 DIAGNOSIS — F3171 Bipolar disorder, in partial remission, most recent episode hypomanic: Secondary | ICD-10-CM

## 2021-06-06 DIAGNOSIS — G20 Parkinson's disease: Secondary | ICD-10-CM | POA: Diagnosis not present

## 2021-06-06 DIAGNOSIS — R040 Epistaxis: Secondary | ICD-10-CM | POA: Insufficient documentation

## 2021-06-06 LAB — CBC WITH DIFFERENTIAL/PLATELET
Abs Immature Granulocytes: 0.03 10*3/uL (ref 0.00–0.07)
Basophils Absolute: 0.1 10*3/uL (ref 0.0–0.1)
Basophils Relative: 1 %
Eosinophils Absolute: 0.1 10*3/uL (ref 0.0–0.5)
Eosinophils Relative: 1 %
HCT: 37.7 % (ref 36.0–46.0)
Hemoglobin: 12.3 g/dL (ref 12.0–15.0)
Immature Granulocytes: 0 %
Lymphocytes Relative: 30 %
Lymphs Abs: 3.2 10*3/uL (ref 0.7–4.0)
MCH: 31.5 pg (ref 26.0–34.0)
MCHC: 32.6 g/dL (ref 30.0–36.0)
MCV: 96.7 fL (ref 80.0–100.0)
Monocytes Absolute: 1 10*3/uL (ref 0.1–1.0)
Monocytes Relative: 9 %
Neutro Abs: 6.4 10*3/uL (ref 1.7–7.7)
Neutrophils Relative %: 59 %
Platelets: 312 10*3/uL (ref 150–400)
RBC: 3.9 MIL/uL (ref 3.87–5.11)
RDW: 13 % (ref 11.5–15.5)
WBC: 10.7 10*3/uL — ABNORMAL HIGH (ref 4.0–10.5)
nRBC: 0 % (ref 0.0–0.2)

## 2021-06-06 LAB — BASIC METABOLIC PANEL
Anion gap: 11 (ref 5–15)
BUN: 31 mg/dL — ABNORMAL HIGH (ref 8–23)
CO2: 25 mmol/L (ref 22–32)
Calcium: 9.2 mg/dL (ref 8.9–10.3)
Chloride: 99 mmol/L (ref 98–111)
Creatinine, Ser: 1.23 mg/dL — ABNORMAL HIGH (ref 0.44–1.00)
GFR, Estimated: 47 mL/min — ABNORMAL LOW (ref >60)
Glucose, Bld: 75 mg/dL (ref 70–99)
Potassium: 4.8 mmol/L (ref 3.5–5.1)
Sodium: 135 mmol/L (ref 135–145)

## 2021-06-06 LAB — PROTIME-INR
INR: 1 (ref 0.8–1.2)
Prothrombin Time: 13.4 seconds (ref 11.4–15.2)

## 2021-06-06 MED ORDER — TRANEXAMIC ACID FOR EPISTAXIS
500.0000 mg | Freq: Once | TOPICAL | Status: AC
  Administered 2021-06-06: 500 mg via TOPICAL
  Filled 2021-06-06: qty 10

## 2021-06-06 MED ORDER — OXYMETAZOLINE HCL 0.05 % NA SOLN
1.0000 | Freq: Once | NASAL | Status: AC
  Administered 2021-06-06: 1 via NASAL
  Filled 2021-06-06: qty 30

## 2021-06-06 MED ORDER — LIDOCAINE-EPINEPHRINE 2 %-1:100000 IJ SOLN
20.0000 mL | Freq: Once | INTRAMUSCULAR | Status: AC
  Administered 2021-06-06: 20 mL
  Filled 2021-06-06: qty 1

## 2021-06-06 MED ORDER — PROPRANOLOL HCL 20 MG PO TABS
20.0000 mg | ORAL_TABLET | Freq: Once | ORAL | Status: AC
  Administered 2021-06-06: 20 mg via ORAL
  Filled 2021-06-06: qty 1

## 2021-06-06 NOTE — Discharge Instructions (Addendum)
Please follow-up with your ENT specialist in clinic in the next 2-3 days. Keep the packing in place.

## 2021-06-06 NOTE — ED Notes (Signed)
Patient has no concerns after AVS has been reviewed and patient education provided. Patient discharged. 

## 2021-06-06 NOTE — ED Triage Notes (Signed)
Pt bib [facility] from home d/t epitaxies.  Had one episode at 1000 this am, started again at 1600 has not resolved.  Afrin given by EMS direct pressure x 20 minutes w/o cessation.

## 2021-06-06 NOTE — ED Provider Notes (Signed)
[facility] DEPT Provider Note   CSN: [id number] Arrival date & time: 06/06/21  1731     History Chief Complaint  Patient presents with   Epistaxis    Kristin Gilmore is a 70 y.o. female.  The history is provided by the patient.  Epistaxis Location:  Bilateral Severity:  Moderate Duration:  4 hours Timing:  Constant Progression:  Unchanged Chronicity:  New Context: nose picking   Context: not anticoagulants, not foreign body and not trauma   Relieved by:  Nothing Ineffective treatments:  Applying pressure and vasoconstrictors Associated symptoms: blood in oropharynx and cough   Associated symptoms: no fever, no sore throat and no syncope   Risk factors: no frequent nosebleeds       Past Medical History:  Diagnosis Date   Arthritis    Depression    Dyslipidemia    Hypertension    Migraine    Migraine without aura, without mention of intractable migraine without mention of status migrainosus 04/13/2014   Parkinson's disease ([place]) 01/24/2021   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   Pre-diabetes    "i was told i was pre-diabetic a while ago"   Tremor 04/13/2014    Patient Active Problem List   Diagnosis Date Noted   Parkinson's disease ([place]) 01/24/2021   Trochanteric bursitis, left hip 01/18/2020   Lumbar spinal stenosis 12/19/2017   Pain in joint, ankle and foot 05/02/2016   Chronic venous insufficiency 05/02/2016   Peripheral edema 11/01/2015   Intractable chronic migraine without aura 11/02/2014   Tremor 04/13/2014   Migraine without aura 04/13/2014   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     Family History  Problem Relation Age of Onset   Cancer Father        stomach cancer   Migraines Mother     Social History   Tobacco Use   Smoking status: Never   Smokeless tobacco: Never  Vaping Use   Vaping Use: Never used  Substance Use Topics   Alcohol use: Yes    Alcohol/week: 0.0 standard drinks    Comment: occas.   Drug use: No    Home Medications Prior to Admission medications   Medication Sig Start Date End Date Taking? Authorizing Provider  acetaminophen (TYLENOL) 500 MG tablet Take 1,000 mg by mouth 2 (two) times daily.     [provider]  ALPRAZolam Duanne Moron) 1 MG tablet Take 1 tablet (1 mg total) by mouth at bedtime as needed for anxiety. 05/03/21   Kathrynn Ducking, MD  amitriptyline (ELAVIL) 10 MG tablet Take 3 tablets (30 mg total) by mouth at bedtime. 05/29/21   Kathrynn Ducking, MD  amLODipine (NORVASC) 5 MG tablet amlodipine 5 mg tablet  TAKE 1 TABLET BY MOUTH TWICE A DAY 07/27/18   [provider]  baclofen (LIORESAL) 10 MG tablet TAKE 0.5-1 TABLETS (5-10 MG TOTAL)  BY MOUTH 3 (THREE) TIMES DAILY AS NEEDED FOR MUSCLE SPASMS. 05/18/20   Hilts, Legrand Como, MD  Calcium Citrate (CAL-CITRATE PO) Take 1-2 tablets by mouth 2 (two) times daily. TAKE 1 TABLET IN THE MORNING & TAKE 2 TABLETS WITH SUPPER    [provider]  carvedilol (COREG) 25 MG tablet Take a half pill twice per day 07/14/19   [provider]  Cholecalciferol (VITAMIN D3) 2000 UNITS TABS Take 2,000 Units by mouth daily with supper.     [provider]  Coenzyme Q10 (COQ10) 100 MG CAPS Take 100 mg by mouth daily with supper.    [provider]  diclofenac (CATAFLAM) 50 MG tablet Take 1 tablet (50 mg total) by mouth 3 (three)  times daily. 05/07/21   Kathrynn Ducking, MD  Erenumab-aooe (AIMOVIG) 140 MG/ML SOAJ Inject 140 mg into the skin every 30 (thirty) days. 05/29/21   Kathrynn Ducking, MD  ferrous sulfate 325 (65 FE) MG EC tablet Take 1 tablet by mouth 2 (two) times daily. 10/05/19   [provider]  fluticasone (FLONASE) 50 MCG/ACT nasal spray Place 1 spray into both nostrils daily.  01/18/17   [provider]  gabapentin (NEURONTIN) 300 MG capsule Take 300 mg by mouth every 6 (six) hours as needed.     [provider]  glimepiride (AMARYL) 2 MG tablet Take 2 mg by mouth every morning. 10/19/19   [provider]  Glucosamine-Chondroitin (COSAMIN DS PO) Take 3 capsules by mouth daily. 1500 glucosamine    [provider]  hydrochlorothiazide (HYDRODIURIL) 25 MG tablet Take 25 mg by mouth daily. 04/10/19   [provider]  loperamide (IMODIUM) 2 MG capsule Take 2-4 mg by mouth 2 (two) times daily as needed for diarrhea or loose stools.    [provider]  MAGNESIUM SULFATE PO Take 1 tablet by mouth at bedtime.     [provider]  methylPREDNISolone (MEDROL) 4 MG tablet Take 1 tablet (4 mg total) by mouth daily. 08/21/20   Jessy Oto, MD  nabumetone (RELAFEN) 750 MG tablet TAKE 1 TABLET (750 MG TOTAL) BY MOUTH 2 (TWO) TIMES DAILY AS NEEDED. 04/27/20   Hilts, Legrand Como, MD  nystatin cream (MYCOSTATIN) APPLY TO AREA TWICE A DAY 08/17/19   [provider]  Omega 3 1000 MG CAPS Take 1,000 mg by mouth 2 (two) times daily. SUPER OMEGA-3    [provider]  OVER THE COUNTER MEDICATION Take 2 capsules by mouth daily with supper. Quick 6 Weight Loss Supplement    [provider]  Probiotic Product (PROBIOTIC PO) Take 1 capsule by mouth daily. SUPREMA DOPHILUS 5 BILLION CFU    [provider]  propranolol (INDERAL) 20 MG tablet TAKE 1 TABLET TWICE DAILY X 2 WEEKS, THEN TAKE 2 TABLETS TWICE DAILY 03/20/21   Suzzanne Cloud, NP   QUEtiapine (SEROQUEL) 300 MG tablet TAKE 0.5 TABLETS (150 MG TOTAL) BY MOUTH AT BEDTIME. Patient taking differently: 1/4 tablet at night 05/14/21   Cottle, Billey Co., MD  Red Yeast Rice 600 MG CAPS Take 1,200 mg by mouth 2 (two) times daily.    [provider]  ROYAL JELLY PO Take 150 mg by mouth daily.    [provider]  SUMAtriptan (IMITREX) 50 MG tablet 1 AS NEEDED FOR HEADACHE MAY REPEAT IN 2HR IF HEADACHE PERSISTS/RECURS NO MORE THAN 2/DAY OR 4/WK 08/30/20   Suzzanne Cloud, NP  telmisartan (MICARDIS) 80 MG tablet telmisartan 80  mg tablet    [provider]  tiZANidine (ZANAFLEX) 2 MG tablet Take 1 tablet (2 mg total) by mouth 3 (three) times daily. 05/07/21   Kathrynn Ducking, MD  trihexyphenidyl (ARTANE) 2 MG tablet TAKE 1 TABLET (2 MG TOTAL) BY MOUTH 3 (THREE) TIMES DAILY WITH MEALS. 06/05/21   Kathrynn Ducking, MD  TURMERIC PO Take 475 mg by mouth 2 (two) times daily. WITH FOOD    [provider]  vitamin E 400 UNIT capsule Take 400 Units by mouth daily with supper.     [provider]    Allergies    Amoxicillin, Clarithromycin, and Atropine  Review of Systems   Review of Systems  Constitutional:  Negative for chills and fever.  HENT:  Positive for nosebleeds. Negative for ear pain and sore throat.   Eyes:  Negative for pain and visual disturbance.  Respiratory:  Positive for cough. Negative for shortness of breath.   Cardiovascular:  Negative for chest pain, palpitations and syncope.  Gastrointestinal:  Negative for abdominal pain and vomiting.  Genitourinary:  Negative for dysuria and hematuria.  Musculoskeletal:  Negative for arthralgias and back pain.  Skin:  Negative for color change and rash.  Neurological:  Negative for seizures and syncope.  All other systems reviewed and are negative.  Physical Exam Updated Vital Signs BP (!) 165/151   Pulse 75   Temp 98.2 F (36.8 C) (Oral)   Resp (!) 23   Ht 5' 7.5" (1.715 m)   Wt  73.5 kg   SpO2 100%   BMI 25.00 kg/m   Physical Exam Vitals and nursing note reviewed.  Constitutional:      General: She is in acute distress.     Appearance: She is well-developed.  HENT:     Head: Normocephalic and atraumatic.     Nose:     Right Nostril: Epistaxis present.     Left Nostril: Epistaxis present.  Eyes:     Conjunctiva/sclera: Conjunctivae normal.  Cardiovascular:     Rate and Rhythm: Normal rate and regular rhythm.     Heart sounds: No murmur heard. Pulmonary:     Effort: Pulmonary effort is normal. No respiratory distress.     Breath sounds: Normal breath sounds.  Abdominal:     Palpations: Abdomen is soft.     Tenderness: There is no abdominal tenderness.  Musculoskeletal:     Cervical back: Neck supple.  Skin:    General: Skin is warm and dry.  Neurological:     Mental Status: She is alert. Mental status is at baseline.     Comments: Resting tremor present    ED Results / Procedures / Treatments   Labs (all labs ordered are listed, but only abnormal results are displayed) Labs Reviewed  CBC WITH DIFFERENTIAL/PLATELET - Abnormal; Notable for the following components:      Result Value   WBC 10.7 (*)    All other components within normal limits  BASIC METABOLIC PANEL - Abnormal; Notable for the following components:   BUN 31 (*)    Creatinine, Ser 1.23 (*)    GFR, Estimated 47 (*)    All other components within normal limits  PROTIME-INR    EKG None  Radiology No results found.  Procedures .Epistaxis Management  Date/Time: 06/06/2021 5:46 PM Performed by: Regan Lemming, MD Authorized by: Regan Lemming, MD   Consent:    Consent obtained:  Verbal   Consent given by:  Patient   Risks  discussed:  Bleeding, infection, nasal injury and pain Universal protocol:    Patient identity confirmed:  Verbally with patient and arm band Procedure details:    Treatment site:  L posterior and R posterior   Treatment method:  Merocel sponge    Treatment complexity:  Extensive   Treatment episode: initial   Post-procedure details:    Assessment:  Bleeding decreased   Procedure completion:  Tolerated .Epistaxis Management  Date/Time: 06/06/2021 7:47 PM Performed by: Regan Lemming, MD Authorized by: Regan Lemming, MD   Consent:    Consent obtained:  Verbal   Consent given by:  Patient   Risks discussed:  Bleeding, infection, nasal injury and pain Universal protocol:    Patient identity confirmed:  Verbally with patient Procedure details:    Treatment site:  L anterior and L posterior   Treatment method:  Merocel sponge   Treatment complexity:  Limited   Treatment episode: recurring   Post-procedure details:    Assessment:  Bleeding stopped   Procedure completion:  Tolerated Comments:     Replaced soaked merocel sponge with 2x merocel sponges in the L nare .Epistaxis Management  Date/Time: 06/06/2021 9:13 PM Performed by: Regan Lemming, MD Authorized by: Regan Lemming, MD   Consent:    Consent obtained:  Verbal   Risks discussed:  Bleeding, infection, nasal injury and pain Universal protocol:    Patient identity confirmed:  Verbally with patient Anesthesia:    Anesthesia method:  None Procedure details:    Treatment site:  R posterior   Treatment method:  Merocel sponge   Treatment complexity:  Limited   Treatment episode: recurring   Post-procedure details:    Assessment:  Bleeding stopped   Procedure completion:  Tolerated   Medications Ordered in ED Medications  lidocaine-EPINEPHrine (XYLOCAINE W/EPI) 2 %-1:100000 (with pres) injection 20 mL (20 mLs Other Given 06/06/21 1846)  oxymetazoline (AFRIN) 0.05 % nasal spray 1 spray (1 spray Each Nare Given 06/06/21 1846)  tranexamic acid (CYKLOKAPRON) 1000 MG/10ML topical solution 500 mg (500 mg Topical Given 06/06/21 1846)  propranolol (INDERAL) tablet 20 mg (20 mg Oral Given 06/06/21 2043)    ED Course  I have reviewed the triage vital signs and the nursing  notes.  Pertinent labs & imaging results that were available during my care of the patient were reviewed by me and considered in my medical decision making (see chart for details).    MDM Rules/Calculators/A&P                           70 year old female with past medical history as above presenting with epistaxis.  Patient states that she had an additional bilateral nosebleed this morning that resolved with pressure.  It returned this afternoon and has not resolved with pressure.  EMS administered Afrin and applied pressure for 20 minutes with no significant resolution.  The patient endorses passing large clots and endorses clots in her posterior oropharynx.  She is not on anticoagulation. The patient states that she frequently picks her nose and thinks that this may have been what triggered her nosebleed today.  Physical exam significant for epistaxis bilaterally without clear anterior source, although physically and limited by patient's active bleed.  Initial packing performed following blowing of large clot from the left nare.  Nebulize spray with Afrin, TXA, lidocaine with epi mixture applied bilaterally in each nare.  Merocel sponges were applied in each nare with subsequent control of brisk bleed.  On  reassessment after 1 hour, the patient did have persistent leakage of a small trickle of blood from the left nare primarily.  The Merocel notably soaked and was replaced with 2 Merocel sponge packing.  The patient was hypertensive and her home propranolol was ordered. The merocel pleget in the right nare was removed and replaced with two merocel sponges with adequate hemostasis on reassessment. CBC without an anemia. INR WNL. Overall stable for DC with close ENT follow-up in the next 2-3 days.   Final Clinical Impression(s) / ED Diagnoses Final diagnoses:  Epistaxis  Left-sided epistaxis  Right-sided epistaxis    Rx / DC Orders ED Discharge Orders     None        Regan Lemming,  MD 06/06/21 2116

## 2021-06-07 ENCOUNTER — Ambulatory Visit: Payer: Medicare Other | Admitting: Specialist

## 2021-06-08 ENCOUNTER — Ambulatory Visit: Payer: Medicare Other | Admitting: Physical Therapy

## 2021-06-11 ENCOUNTER — Telehealth: Payer: Self-pay | Admitting: Neurology

## 2021-06-11 NOTE — Telephone Encounter (Signed)
Pt has yet to get the [place] approved thru her insurance, pt asked that it be noted that her migraines begin the moment she wakes up, please call.

## 2021-06-11 NOTE — Telephone Encounter (Signed)
Contacted pt back, LVM, informing her that on 05/30/21 her insurance has Aimovig 140 mg approved 05/30/2021 through 05/30/2022.

## 2021-06-14 ENCOUNTER — Other Ambulatory Visit: Payer: Self-pay

## 2021-06-14 ENCOUNTER — Encounter: Payer: Self-pay | Admitting: Surgery

## 2021-06-14 ENCOUNTER — Ambulatory Visit (INDEPENDENT_AMBULATORY_CARE_PROVIDER_SITE_OTHER): Payer: Medicare Other | Admitting: Surgery

## 2021-06-14 VITALS — BP 120/74 | HR 83

## 2021-06-14 DIAGNOSIS — M5136 Other intervertebral disc degeneration, lumbar region: Secondary | ICD-10-CM

## 2021-06-14 DIAGNOSIS — M4726 Other spondylosis with radiculopathy, lumbar region: Secondary | ICD-10-CM | POA: Diagnosis not present

## 2021-06-14 DIAGNOSIS — M48062 Spinal stenosis, lumbar region with neurogenic claudication: Secondary | ICD-10-CM | POA: Diagnosis not present

## 2021-06-14 DIAGNOSIS — M4316 Spondylolisthesis, lumbar region: Secondary | ICD-10-CM

## 2021-06-14 NOTE — Telephone Encounter (Signed)
Pt called back stating she never heard from anyone in response to her morning call on 9-12.  The message from [place], [place] was relayed to her and pt then recalled the vm.  Pt is asking that it be noted she is waking up with headaches and would like to know if it is ok if she continues to take her headache medication for pain while waiting on the matter with Aimovig to be resolved.  Please call.

## 2021-06-14 NOTE — Progress Notes (Signed)
70 year old white female returns for recheck after having multilevel facet injection procedure with Dr. Ernestina Patches April 22, 2019.  States that she did not have any improvement of her back pain.  Last lumbar MRI scan was done March 2021.  She has not had a study since that time.  Exam patient ambulates with a walker.  Localizes pain to the lower lumbar area.  Plan Since patient has failed conservative treatment at this point with recent facet injections I will update her lumbar MRI scan and this will be compared to the previous study done March 2021.  Follow with Dr. Louanne Skye in 3 weeks for recheck to discuss results and further treatment options.  Patient states that she is scheduled for upcoming total knee replacement with Dr. Erlinda Hong.

## 2021-06-14 NOTE — Telephone Encounter (Signed)
Contacted pt back, advised her that she can continue to take sumatriptan. She informed me that she only has 2 left and has been taking tylenol, ibuprofen , sumatriptan and gabapentin.  Advised her that 10 tab of sumatriptan is suppose to last her 30 days. She is more than likely having a rebound headache due to taking too much medication. Advised to not take any more today and space out medications appropriately for safe usage.

## 2021-06-15 ENCOUNTER — Ambulatory Visit: Payer: Medicare Other | Admitting: Physical Therapy

## 2021-06-15 ENCOUNTER — Encounter: Payer: Self-pay | Admitting: Physical Therapy

## 2021-06-15 DIAGNOSIS — M545 Low back pain, unspecified: Secondary | ICD-10-CM

## 2021-06-15 DIAGNOSIS — G8929 Other chronic pain: Secondary | ICD-10-CM

## 2021-06-15 DIAGNOSIS — M25561 Pain in right knee: Secondary | ICD-10-CM

## 2021-06-15 DIAGNOSIS — R293 Abnormal posture: Secondary | ICD-10-CM | POA: Diagnosis not present

## 2021-06-15 DIAGNOSIS — M6281 Muscle weakness (generalized): Secondary | ICD-10-CM

## 2021-06-15 DIAGNOSIS — R262 Difficulty in walking, not elsewhere classified: Secondary | ICD-10-CM

## 2021-06-15 NOTE — Therapy (Signed)
[facility] [address] [place], [place], [address] Phone: [phone]   Fax:  [phone]  Physical Therapy Treatment  Patient Details  Name: Kristin Gilmore MRN: [id number] Date of Birth: 1951/03/27 Referring Provider (PT): [name], MD   Encounter Date: 06/15/2021   PT End of Session - 06/15/21 1541     Visit Number 5    Date for PT Re-Evaluation 07/11/21    Authorization Type Medicare    Authorization Time Period KX at 15 visits    Progress Note Due on Visit 10    PT Start Time 1345    PT Stop Time 1430    PT Time Calculation (min) 45 min    Activity Tolerance Patient tolerated treatment well    Behavior During Therapy [facility] for tasks assessed/performed             Past Medical History:  Diagnosis Date   Arthritis    Depression    Dyslipidemia    Hypertension    Migraine    Migraine without aura, without mention of intractable migraine without mention of status migrainosus 04/13/2014   Parkinson's disease ([place]) 01/24/2021   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   Pre-diabetes    "i was told i was pre-diabetic a while ago"   Tremor 04/13/2014    Past Surgical History:  Procedure Laterality Date   CHOLECYSTECTOMY     COLONOSCOPY W/ POLYPECTOMY     LUMBAR LAMINECTOMY  07/2020   3 level decompression - [place], [place]   LUMBAR LAMINECTOMY/DECOMPRESSION MICRODISCECTOMY N/A 12/19/2017   Procedure: LAMINECTOMY LUMBAR TWO- LUMBAR THREE, LUMBAR THREE- LUMBAR FOUR, LUMBAR FOUR- LUMBAR FIVE ;  Surgeon: Consuella Lose, MD;  Location: [facility];  Service: Neurosurgery;  Laterality: N/A;   NASAL SINUS SURGERY     TONSILLECTOMY     WISDOM TOOTH EXTRACTION      There were no vitals filed for this visit.   Subjective Assessment - 06/15/21 1539     Subjective My back is getting worse, probably because I sit too much. My knee is bad, I almost fell teh other day.    Pertinent History L2/3, L3/4, L4/5 and  L5/S1 laminectomy    Limitations Standing    Currently in Pain? --   RT KNEE 8/10 intermittent, low back 5-6/10   Pain Descriptors / Indicators Constant             Treatment: Patient seen for aquatic therapy today.  Treatment took place in water 2.5-4 feet deep depending upon activity.  Pt entered the pool via stairs step to step with heavy use of hand rails. Pt requires buoyancy of water for support and to offload joints with strengthening exercises. Water temp 93 degrees F.  Seated water bench with 75% submersion Pt performed seated LE AROM exercises 20x in all planes, pain assessment concurrent: abdominal compressions 10x 5 sec hold, double blue UE weights for shoulder horizontal add/abd 2x10, glute lifts off the seat: attempted but could only control about 5 reps, sit on edge of bench for shoulder flex/extension 2x10, biceps/triceps 2x10  Standing in 75% submersion: with yellow noodle pt could ambulate 4 short lengths in all direction. Wall exercises: 10X bil hip 3 ways, some RT knee pain towards end from repeated ( but short) SLS moments. Seated decompression float intermittently throughout the sessison to give RT knee a rest when needed. Hip add/abd 2x10, flex/ext 10x Bil  PT Short Term Goals - 05/16/21 1718       PT SHORT TERM GOAL #1   Title Patient will be independent with HEP for continued progression at home.    Time 4    Period Weeks    Status New    Target Date 06/13/21      PT SHORT TERM GOAL #2   Title Patient will ambulate x100 feet using SPC without seated recovery for improved activity tolerance.    Baseline 100 feet x1 seated rest at 40 feet    Time 4    Period Weeks    Status New    Target Date 06/13/21               PT Long Term Goals - 05/16/21 1720       PT LONG TERM GOAL #1   Title Patient will be independent with advanced HEP for long term management of symptoms post D/C.    Time 8     Period Weeks    Status New    Target Date 07/11/21      PT LONG TERM GOAL #2   Title Patient will score 47 or higher on FOTO to indicate improved overall function.    Baseline 37    Time 8    Period Weeks    Status New    Target Date 07/11/21      PT LONG TERM GOAL #3   Title Patient will perform 5 times sit to stand in 14 seconds or less with good eccentric control to indicate improved functional mobility.    Baseline 17.14 seconds    Time 8    Period Weeks    Status New    Target Date 07/11/21      PT LONG TERM GOAL #4   Title Patient will perform at least 20 minutes of standing activity with no more than 3/10 knee or back pain to more readily perform meal prep.    Baseline 5 minutes    Time 8    Period Weeks    Status New    Target Date 07/11/21                   Plan - 06/15/21 1541     Clinical Impression Statement Pt reports increased back pain after first aquatic session. " I over did it, maybe." RT knee most limiting factor in mobility. Pt seems to tolerate the water well, but she says "later I may hurt." TKR in 1.5 weeks,    Personal Factors and Comorbidities Comorbidity 3+    Comorbidities numerous lumbar surgeries, Parkinson's disease, OA    Examination-Activity Limitations Locomotion Level;Stand    Stability/Clinical Decision Making Evolving/Moderate complexity    Rehab Potential Good    PT Frequency 2x / week    PT Duration 8 weeks    PT Next Visit Plan seated core strength, no direct knee exercise per MD    PT Home Exercise Plan Access Code: [email or web address]    Consulted and Agree with Plan of Care Patient             Patient will benefit from skilled therapeutic intervention in order to improve the following deficits and impairments:  Decreased activity tolerance, Decreased balance, Decreased endurance, Decreased mobility, Decreased strength, Difficulty walking, Increased muscle spasms, Increased fascial restricitons, Improper body mechanics, Postural  dysfunction, Pain  Visit Diagnosis: Abnormal posture  Muscle weakness (generalized)  Chronic low back pain, unspecified back pain laterality, unspecified whether  sciatica present  Chronic pain of right knee  Difficulty in walking, not elsewhere classified     Problem List Patient Active Problem List   Diagnosis Date Noted   Parkinson's disease ([place]) 01/24/2021   Trochanteric bursitis, left hip 01/18/2020   Lumbar spinal stenosis 12/19/2017   Pain in joint, ankle and foot 05/02/2016   Chronic venous insufficiency 05/02/2016   Peripheral edema 11/01/2015   Intractable chronic migraine without aura 11/02/2014   Tremor 04/13/2014   Migraine without aura 04/13/2014   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, [place], [place], [address] Phone: [phone]   Fax:  [phone]  Name: Kristin Gilmore MRN: [id number] Date of Birth: Jan 30, 1951

## 2021-06-18 ENCOUNTER — Ambulatory Visit: Payer: Medicare Other

## 2021-06-18 ENCOUNTER — Other Ambulatory Visit: Payer: Self-pay

## 2021-06-18 DIAGNOSIS — M545 Low back pain, unspecified: Secondary | ICD-10-CM

## 2021-06-18 DIAGNOSIS — R293 Abnormal posture: Secondary | ICD-10-CM | POA: Diagnosis not present

## 2021-06-18 DIAGNOSIS — R262 Difficulty in walking, not elsewhere classified: Secondary | ICD-10-CM

## 2021-06-18 DIAGNOSIS — G8929 Other chronic pain: Secondary | ICD-10-CM

## 2021-06-18 DIAGNOSIS — M6281 Muscle weakness (generalized): Secondary | ICD-10-CM

## 2021-06-18 NOTE — Therapy (Addendum)
[facility] [address] [place], [place], [address] Phone: [phone]   Fax:  [phone]  Physical Therapy Treatment  Patient Details  Name: Kristin Gilmore MRN: [id number] Date of Birth: 01/03/1951 Referring Provider (PT): [name], MD   Encounter Date: 06/18/2021   PT End of Session - 06/18/21 1701     Visit Number 6    Date for PT Re-Evaluation 07/11/21    Authorization Type Medicare    Authorization Time Period KX at 15 visits    Progress Note Due on Visit 10    PT Start Time 1617    PT Stop Time 1657    PT Time Calculation (min) 40 min    Activity Tolerance Patient tolerated treatment well    Behavior During Therapy [facility] for tasks assessed/performed             Past Medical History:  Diagnosis Date   Arthritis    Depression    Dyslipidemia    Hypertension    Migraine    Migraine without aura, without mention of intractable migraine without mention of status migrainosus 04/13/2014   Parkinson's disease ([place]) 01/24/2021   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   Pre-diabetes    "i was told i was pre-diabetic a while ago"   Tremor 04/13/2014    Past Surgical History:  Procedure Laterality Date   CHOLECYSTECTOMY     COLONOSCOPY W/ POLYPECTOMY     LUMBAR LAMINECTOMY  07/2020   3 level decompression - [place], [place]   LUMBAR LAMINECTOMY/DECOMPRESSION MICRODISCECTOMY N/A 12/19/2017   Procedure: LAMINECTOMY LUMBAR TWO- LUMBAR THREE, LUMBAR THREE- LUMBAR FOUR, LUMBAR FOUR- LUMBAR FIVE ;  Surgeon: Consuella Lose, MD;  Location: [place] [facility];  Service: Neurosurgery;  Laterality: N/A;   NASAL SINUS SURGERY     TONSILLECTOMY     WISDOM TOOTH EXTRACTION      There were no vitals filed for this visit.   Subjective Assessment - 06/18/21 1618     Subjective I have been doing my exercises. I'm having my knee replaced next week.    Pertinent History L2/3, L3/4, L4/5 and L5/S1 laminectomy    Currently in  Pain? Yes    Pain Score 6     Pain Location Back    Pain Orientation Right;Lower;Left    Pain Descriptors / Indicators Constant    Pain Type Chronic pain    Pain Onset More than a month ago    Pain Frequency Constant    Aggravating Factors  sitting too long, standing    Pain Relieving Factors topical rub                               [facility] Adult PT Treatment/Exercise - 06/18/21 0001       Lumbar Exercises: Aerobic   Nustep Level 1x 30 seconds- too much pain so stopped      Lumbar Exercises: Seated   Hip Flexion on Ball Limitations row with green band x20    Sit to Stand 5 reps    Sit to Stand Limitations ball roll outs 10 forward and 5 each side    Other Seated Lumbar Exercises diagonals with 5# kettle bell and crunch x 10 each    Other Seated Lumbar Exercises ball squeeze 5" hold x 10                       PT Short  Term Goals - 05/16/21 1718       PT SHORT TERM GOAL #1   Title Patient will be independent with HEP for continued progression at home.    Time 4    Period Weeks    Status New    Target Date 06/13/21      PT SHORT TERM GOAL #2   Title Patient will ambulate x100 feet using SPC without seated recovery for improved activity tolerance.    Baseline 100 feet x1 seated rest at 40 feet    Time 4    Period Weeks    Status New    Target Date 06/13/21               PT Long Term Goals - 05/16/21 1720       PT LONG TERM GOAL #1   Title Patient will be independent with advanced HEP for long term management of symptoms post D/C.    Time 8    Period Weeks    Status New    Target Date 07/11/21      PT LONG TERM GOAL #2   Title Patient will score 47 or higher on FOTO to indicate improved overall function.    Baseline 37    Time 8    Period Weeks    Status New    Target Date 07/11/21      PT LONG TERM GOAL #3   Title Patient will perform 5 times sit to stand in 14 seconds or less with good eccentric control to indicate  improved functional mobility.    Baseline 17.14 seconds    Time 8    Period Weeks    Status New    Target Date 07/11/21      PT LONG TERM GOAL #4   Title Patient will perform at least 20 minutes of standing activity with no more than 3/10 knee or back pain to more readily perform meal prep.    Baseline 5 minutes    Time 8    Period Weeks    Status New    Target Date 07/11/21                   Plan - 06/18/21 1643     Clinical Impression Statement Pt will have total knee replacement surgery next week.  Pt has been attending aquatic PT to allow for exercise with reduced stress on her low back and knee joints.  Pt has been independent and compliant with HEP.  Pt required frequent cues and required supervision to monitor for pain with exercise.  Pt was not able to tolerate NuStep due to Rt knee pain today so this was stopped after 30 seconds.  Pt will attend 1 more session and will D/C to HEP prior to knee surgery.    PT Treatment/Interventions Aquatic Therapy;ADLs/Self Care Home Management;Moist Heat;Gait training;Stair training;Functional mobility training;Therapeutic activities;Therapeutic exercise;Balance training;Patient/family education;Manual techniques;Dry needling;Taping;Spinal Manipulations;Joint Manipulations    PT Next Visit Plan 1 more session prior to D/C    PT Home Exercise Plan Access Code: [email or web address]    Consulted and Agree with Plan of Care Patient             Patient will benefit from skilled therapeutic intervention in order to improve the following deficits and impairments:  Decreased activity tolerance, Decreased balance, Decreased endurance, Decreased mobility, Decreased strength, Difficulty walking, Increased muscle spasms, Increased fascial restricitons, Improper body mechanics, Postural dysfunction, Pain  Visit Diagnosis: Abnormal posture  Muscle weakness (  generalized)  Chronic low back pain, unspecified back pain laterality, unspecified whether  sciatica present  Chronic pain of right knee  Difficulty in walking, not elsewhere classified     Problem List Patient Active Problem List   Diagnosis Date Noted   Parkinson's disease ([place]) 01/24/2021   Trochanteric bursitis, left hip 01/18/2020   Lumbar spinal stenosis 12/19/2017   Pain in joint, ankle and foot 05/02/2016   Chronic venous insufficiency 05/02/2016   Peripheral edema 11/01/2015   Intractable chronic migraine without aura 11/02/2014   Tremor 04/13/2014   Migraine without aura 04/13/2014   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  She plans to have a total knee replacement soon.  See above for most current PT status.     Remaining deficits: See above.  Pt with limited mobility due to knee pain and LBP.  Pt has HEP in place for strength and flexibility gains.     Education / Equipment: HEP   Patient agrees to discharge. Patient goals were not met. Patient is being discharged due to the patient's request.   Sigurd Sos, PT 06/20/21 12:41 PM  [facility] [address] [place], [place], [address] Phone: [phone]   Fax:  [phone]  Name: Kristin Gilmore MRN: [id number] Date of Birth: 1950/10/04

## 2021-06-19 ENCOUNTER — Telehealth: Payer: Self-pay | Admitting: Surgery

## 2021-06-19 ENCOUNTER — Telehealth: Payer: Self-pay | Admitting: Neurology

## 2021-06-19 ENCOUNTER — Telehealth: Payer: Self-pay | Admitting: Orthopaedic Surgery

## 2021-06-19 ENCOUNTER — Other Ambulatory Visit: Payer: Self-pay | Admitting: Neurology

## 2021-06-19 NOTE — Telephone Encounter (Signed)
Pt called stating she's supposed to have knee surgery and someone came to drop off the machine she uses after (CPM possibly) surgery today. Pt states the machine causes excruciating pain in her lower back; and she doesn't believe she'll be able to do the recommended 3x a week for 2 hrs. Pt would like a CB to discuss some different options she may have.   [phone]

## 2021-06-19 NOTE — Telephone Encounter (Signed)
Pt's still having the headaches with the Aimovig. Pt requesting a call back.

## 2021-06-19 NOTE — Telephone Encounter (Signed)
Pt called stating when she saw Ricard Dillon he told her he wanted her to get an MRI before her surgery next week and [facility] doesn't have anything until the beginning of Oct. Pt would like to have her referral switched to a facility that could get her in either Wednesday or Thursday of this week. Pt would like a CB to discuss further.   [phone]

## 2021-06-20 NOTE — Telephone Encounter (Signed)
Pt has called back to inform that she is still waking up with headaches and that the Aimovig isnt helping.  Pt would like a call re: what Dr Jannifer Franklin would advise her to do.

## 2021-06-20 NOTE — Telephone Encounter (Signed)
Okay that sounds good.

## 2021-06-20 NOTE — Telephone Encounter (Signed)
I called the patient, left a message, I will call back later tomorrow.

## 2021-06-20 NOTE — Telephone Encounter (Signed)
Any other suggestions? Please also see telephone note from 06/11/21. Pt is taking too much medication when I last spoke to her.

## 2021-06-20 NOTE — Telephone Encounter (Signed)
FYI I called patient and advised. She wants to be sure that you know she tried and tried to use the machine but the back pain is too severe. The rep was there for an hour and a half and tried different positions and she just cannot do it. She is going to call and have them pick up the CPM.

## 2021-06-20 NOTE — Telephone Encounter (Signed)
I called Kristin Gilmore with [facility] to see if any appts available for this week, pending acall back

## 2021-06-20 NOTE — Telephone Encounter (Signed)
Can you please advise?

## 2021-06-20 NOTE — Telephone Encounter (Signed)
That is totally fine if she feels that it causes back pain.  She just needs to understand that that machine will help improve her flexibility and reduce scar tissue formation after surgery.

## 2021-06-21 NOTE — Pre-Procedure Instructions (Addendum)
Surgical Instructions   Your procedure is scheduled on Tuesday, September 27th. Report to [facility] Main Entrance "A" at 05:30 A.M., then check in with the Admitting office. Call this number if you have problems the morning of surgery: [phone]   If you have any questions prior to your surgery date call [phone]: Open Monday-Friday 8am-4pm   Remember: Do not eat after midnight the night before your surgery  You may drink clear liquids until 06:30 AM the morning of your surgery.   Clear liquids allowed are: Water, Non-Citrus Juices (without pulp), Carbonated Beverages, Clear Tea, Black Coffee Only, and Gatorade   Take these medicines the morning of surgery with A SIP OF WATER  amLODipine (NORVASC) carvedilol (COREG) propranolol (INDERAL) trihexyphenidyl (ARTANE) pantoprazole (PROTONIX)  tolterodine (DETROL LA)  Venlafaxine HCl  If needed: gabapentin (NEURONTIN) SUMAtriptan (IMITREX)  acetaminophen (TYLENOL)  dicyclomine (BENTYL)  tiZANidine (ZANAFLEX) traMADol-acetaminophen (ULTRACET)    WHAT DO I DO ABOUT MY DIABETES MEDICATION?   Do not take glimepiride (AMARYL) the morning of surgery.    HOW TO MANAGE YOUR DIABETES BEFORE AND AFTER SURGERY  Why is it important to control my blood sugar before and after surgery? Improving blood sugar levels before and after surgery helps healing and can limit problems. A way of improving blood sugar control is eating a healthy diet by:  Eating less sugar and carbohydrates  Increasing activity/exercise  Talking with your doctor about reaching your blood sugar goals High blood sugars (greater than 180 mg/dL) can raise your risk of infections and slow your recovery, so you will need to focus on controlling your diabetes during the weeks before surgery. Make sure that the doctor who takes care of your diabetes knows about your planned surgery including the date and location.  How do I manage my blood sugar before  surgery? Check your blood sugar at least 4 times a day, starting 2 days before surgery, to make sure that the level is not too high or low.  Check your blood sugar the morning of your surgery when you wake up and every 2 hours until you get to the Short Stay unit.  If your blood sugar is less than 70 mg/dL, you will need to treat for low blood sugar: Do not take insulin. Treat a low blood sugar (less than 70 mg/dL) with  cup of clear juice (cranberry or apple), 4 glucose tablets, OR glucose gel. Recheck blood sugar in 15 minutes after treatment (to make sure it is greater than 70 mg/dL). If your blood sugar is not greater than 70 mg/dL on recheck, call [phone] for further instructions. Report your blood sugar to the short stay nurse when you get to Short Stay.  If you are admitted to the hospital after surgery: Your blood sugar will be checked by the staff and you will probably be given insulin after surgery (instead of oral diabetes medicines) to make sure you have good blood sugar levels. The goal for blood sugar control after surgery is 80-180 mg/dL.  As of today, STOP taking any Aspirin (unless otherwise instructed by your surgeon) Aleve, Naproxen, Ibuprofen, Motrin, Advil, Goody's, BC's, all herbal medications, fish oil, and all vitamins.                     Do NOT Smoke (Tobacco/Vaping) or drink Alcohol 24 hours prior to your procedure.  If you use a CPAP at night, you may bring all equipment for your overnight stay.   Contacts, glasses, piercing's,  hearing aid's, dentures or partials may not be worn into surgery, please bring cases for these belongings.    For patients admitted to the hospital, discharge time will be determined by your treatment team.   Patients discharged the day of surgery will not be allowed to drive home, and someone needs to stay with them for 24 hours.  ONLY 1 SUPPORT PERSON MAY BE PRESENT WHILE YOU ARE IN SURGERY. IF YOU ARE TO BE ADMITTED ONCE YOU ARE  IN YOUR ROOM YOU WILL BE ALLOWED TWO (2) VISITORS.  Minor children may have two parents present. Special consideration for safety and communication needs will be reviewed on a case by case basis.   Special instructions:   [place]- Preparing For Surgery  Before surgery, you can play an important role. Because skin is not sterile, your skin needs to be as free of germs as possible. You can reduce the number of germs on your skin by washing with CHG (chlorahexidine gluconate) Soap before surgery.  CHG is an antiseptic cleaner which kills germs and bonds with the skin to continue killing germs even after washing.    Oral Hygiene is also important to reduce your risk of infection.  Remember - BRUSH YOUR TEETH THE MORNING OF SURGERY WITH YOUR REGULAR TOOTHPASTE  Please do not use if you have an allergy to CHG or antibacterial soaps. If your skin becomes reddened/irritated stop using the CHG.  Do not shave (including legs and underarms) for at least 48 hours prior to first CHG shower. It is OK to shave your face.  Please follow these instructions carefully.   Shower the NIGHT BEFORE SURGERY and the MORNING OF SURGERY  If you chose to wash your hair, wash your hair first as usual with your normal shampoo.  After you shampoo, rinse your hair and body thoroughly to remove the shampoo.  Use CHG Soap as you would any other liquid soap. You can apply CHG directly to the skin and wash gently with a scrungie or a clean washcloth.   Apply the CHG Soap to your body ONLY FROM THE NECK DOWN.  Do not use on open wounds or open sores. Avoid contact with your eyes, ears, mouth and genitals (private parts). Wash Face and genitals (private parts)  with your normal soap.   Wash thoroughly, paying special attention to the area where your surgery will be performed.  Thoroughly rinse your body with warm water from the neck down.  DO NOT shower/wash with your normal soap after using and rinsing off the CHG  Soap.  Pat yourself dry with a CLEAN TOWEL.  Wear CLEAN PAJAMAS to bed the night before surgery  Place CLEAN SHEETS on your bed the night before your surgery  DO NOT SLEEP WITH PETS.   Day of Surgery: Shower with CHG soap. Do not wear jewelry, make up, nail polish, gel polish, artificial nails, or any other type of covering on natural nails including finger and toenails. If patients have artificial nails, gel coating, etc. that need to be removed by a nail salon please have this removed prior to surgery. Surgery may need to be canceled/delayed if the surgeon/ anesthesia feels like the patient is unable to be adequately monitored. Do not wear lotions, powders, perfumes, or deodorant. Do not shave 48 hours prior to surgery.   Do not bring valuables to the hospital. [facility] is not responsible for any belongings or valuables. Wear Clean/Comfortable clothing the morning of surgery Remember to brush your  teeth WITH YOUR REGULAR TOOTHPASTE.   Please read over the following fact sheets that you were given.

## 2021-06-21 NOTE — Telephone Encounter (Signed)
I called the patient.  For couple days after she took the shot of Aimovig, she was having headaches in the morning.  The headaches have now resolved, we will see how things go.  Next week she will be having surgery on her knee, unfortunately, general anesthesia may sometimes exacerbate migraine.  We will look out for this.  The patient indicates that she does not drink caffeinated products during the day, she snores on occasion but she does not snore a lot.

## 2021-06-22 ENCOUNTER — Encounter (HOSPITAL_COMMUNITY)
Admission: RE | Admit: 2021-06-22 | Discharge: 2021-06-22 | Disposition: A | Discharge status: Home / Self Care | Payer: Medicare Other | Source: Ambulatory Visit | Attending: Orthopaedic Surgery | Admitting: Orthopaedic Surgery

## 2021-06-22 ENCOUNTER — Encounter (HOSPITAL_COMMUNITY): Payer: Self-pay

## 2021-06-22 ENCOUNTER — Ambulatory Visit: Payer: Medicare Other | Admitting: Physical Therapy

## 2021-06-22 ENCOUNTER — Other Ambulatory Visit: Payer: Self-pay

## 2021-06-22 ENCOUNTER — Telehealth: Payer: Self-pay | Admitting: Orthopaedic Surgery

## 2021-06-22 DIAGNOSIS — Z01818 Encounter for other preprocedural examination: Secondary | ICD-10-CM | POA: Insufficient documentation

## 2021-06-22 DIAGNOSIS — G20 Parkinson's disease: Secondary | ICD-10-CM | POA: Diagnosis not present

## 2021-06-22 DIAGNOSIS — Z20822 Contact with and (suspected) exposure to covid-19: Secondary | ICD-10-CM | POA: Insufficient documentation

## 2021-06-22 DIAGNOSIS — R519 Headache, unspecified: Secondary | ICD-10-CM | POA: Insufficient documentation

## 2021-06-22 LAB — GLUCOSE, CAPILLARY: Glucose-Capillary: 111 mg/dL — ABNORMAL HIGH (ref 70–99)

## 2021-06-22 LAB — HEMOGLOBIN A1C
Hgb A1c MFr Bld: 5.8 % — ABNORMAL HIGH (ref 4.8–5.6)
Mean Plasma Glucose: 119.76 mg/dL

## 2021-06-22 LAB — CBC WITH DIFFERENTIAL/PLATELET
Abs Immature Granulocytes: 0.02 10*3/uL (ref 0.00–0.07)
Basophils Absolute: 0.1 10*3/uL (ref 0.0–0.1)
Basophils Relative: 1 %
Eosinophils Absolute: 0.2 10*3/uL (ref 0.0–0.5)
Eosinophils Relative: 2 %
HCT: 34.1 % — ABNORMAL LOW (ref 36.0–46.0)
Hemoglobin: 11.2 g/dL — ABNORMAL LOW (ref 12.0–15.0)
Immature Granulocytes: 0 %
Lymphocytes Relative: 33 %
Lymphs Abs: 2.2 10*3/uL (ref 0.7–4.0)
MCH: 31.5 pg (ref 26.0–34.0)
MCHC: 32.8 g/dL (ref 30.0–36.0)
MCV: 96.1 fL (ref 80.0–100.0)
Monocytes Absolute: 0.6 10*3/uL (ref 0.1–1.0)
Monocytes Relative: 8 %
Neutro Abs: 3.8 10*3/uL (ref 1.7–7.7)
Neutrophils Relative %: 56 %
Platelets: 334 10*3/uL (ref 150–400)
RBC: 3.55 MIL/uL — ABNORMAL LOW (ref 3.87–5.11)
RDW: 13 % (ref 11.5–15.5)
WBC: 6.8 10*3/uL (ref 4.0–10.5)
nRBC: 0 % (ref 0.0–0.2)

## 2021-06-22 LAB — URINALYSIS, ROUTINE W REFLEX MICROSCOPIC
Bilirubin Urine: NEGATIVE
Glucose, UA: NEGATIVE mg/dL
Hgb urine dipstick: NEGATIVE
Ketones, ur: NEGATIVE mg/dL
Leukocytes,Ua: NEGATIVE
Nitrite: NEGATIVE
Protein, ur: NEGATIVE mg/dL
Specific Gravity, Urine: 1.008 (ref 1.005–1.030)
pH: 6 (ref 5.0–8.0)

## 2021-06-22 LAB — COMPREHENSIVE METABOLIC PANEL
ALT: 16 U/L (ref 0–44)
AST: 20 U/L (ref 15–41)
Albumin: 3.8 g/dL (ref 3.5–5.0)
Alkaline Phosphatase: 91 U/L (ref 38–126)
Anion gap: 10 (ref 5–15)
BUN: 26 mg/dL — ABNORMAL HIGH (ref 8–23)
CO2: 27 mmol/L (ref 22–32)
Calcium: 9.5 mg/dL (ref 8.9–10.3)
Chloride: 96 mmol/L — ABNORMAL LOW (ref 98–111)
Creatinine, Ser: 1.34 mg/dL — ABNORMAL HIGH (ref 0.44–1.00)
GFR, Estimated: 42 mL/min — ABNORMAL LOW (ref >60)
Glucose, Bld: 82 mg/dL (ref 70–99)
Potassium: 4.4 mmol/L (ref 3.5–5.1)
Sodium: 133 mmol/L — ABNORMAL LOW (ref 135–145)
Total Bilirubin: 1 mg/dL (ref 0.3–1.2)
Total Protein: 6.8 g/dL (ref 6.5–8.1)

## 2021-06-22 LAB — PROTIME-INR
INR: 1 (ref 0.8–1.2)
Prothrombin Time: 13.1 seconds (ref 11.4–15.2)

## 2021-06-22 LAB — SARS CORONAVIRUS 2 (TAT 6-24 HRS): SARS Coronavirus 2: NEGATIVE

## 2021-06-22 LAB — SURGICAL PCR SCREEN
MRSA, PCR: NEGATIVE
Staphylococcus aureus: NEGATIVE

## 2021-06-22 LAB — APTT: aPTT: 30 seconds (ref 24–36)

## 2021-06-22 NOTE — Telephone Encounter (Signed)
I called and left voice mail message letting patient know her surgery for 06-26-21 has been moved from 10am to 7:15am and she would need to arrive at 5:15 unless otherwise directed by the hospital.

## 2021-06-22 NOTE — Telephone Encounter (Signed)
Err

## 2021-06-22 NOTE — Progress Notes (Signed)
Pt told me numerous times that she cannot come in for surgery at 5:30 AM. Pt advised to call the surgeon's office regarding rescheduling her surgery. I left a voicemail for Dr. Phoebe Sharps surgery scheduler making them aware of the situation. Awaiting response. Pt instructed to follow up with surgeon's office

## 2021-06-22 NOTE — Progress Notes (Signed)
PCP - Bing Matter, PA-C Cardiologist - denies  PPM/ICD - denies   Chest x-ray - 06/15/19 EKG - 06/22/21 at PAT appt Stress Test - 04/10/17 ECHO - 11/23/15 Cardiac Cath - denies  Sleep Study - denies  DM: Type 2 Fasting Blood Sugar - 120-150 Checks Blood Sugar once or twice a week  Blood Thinner Instructions: n/a Aspirin Instructions: (given instructions not to take ASA anymore after today)  ERAS Protcol - yes PRE-SURGERY G2  COVID TEST-  06/22/21 at PAT appt, results pending   Anesthesia review: no  Patient denies shortness of breath, fever, cough and chest pain at PAT appointment   All instructions explained to the patient, with a verbal understanding of the material. Patient agrees to go over the instructions while at home for a better understanding.  The opportunity to ask questions was provided.

## 2021-06-25 ENCOUNTER — Telehealth: Payer: Self-pay | Admitting: Neurology

## 2021-06-25 ENCOUNTER — Encounter (HOSPITAL_COMMUNITY): Payer: Self-pay | Admitting: Orthopaedic Surgery

## 2021-06-25 DIAGNOSIS — M1711 Unilateral primary osteoarthritis, right knee: Secondary | ICD-10-CM

## 2021-06-25 MED ORDER — TRANEXAMIC ACID 1000 MG/10ML IV SOLN
2000.0000 mg | INTRAVENOUS | Status: DC
  Filled 2021-06-25: qty 20

## 2021-06-25 MED ORDER — SUMATRIPTAN SUCCINATE 100 MG PO TABS: 100.0000 mg | ORAL_TABLET | Freq: Two times a day (BID) | ORAL | 2 refills | Status: DC | PRN

## 2021-06-25 NOTE — Progress Notes (Signed)
Anesthesia Chart Review:  Patient follows with neurology for history of persistent daily headaches and Parkinson's disease.  Last seen by Dr. Jannifer Franklin 05/29/2021.  Per note, " She is having daily headaches.  She is on amitriptyline working up to 30 mg at night, currently on 20 mg at night.  She is on 150 mg of Seroquel, and she has gabapentin.  The patient in the past has had Botox therapies that have not been effective.  She also has tremors on the left greater than right upper extremities and on the left lower extremity, a DaTscan done previously was felt to be consistent with Parkinson's disease.  She has not really had much in the way of mobility issues with exception that she has severe arthritis of the right knee and will be undergoing knee surgery in the near future."  Patient had evaluation in 2018 for episode of atypical chest pain.  Nuclear stress was normal, nonischemic, low risk.  She had a previous echo in February 2017 that was also normal, EF 55 to 60%, no significant valvular abnormalities.  Blood pressure noted to be elevated at preadmission testing appointment, 162/22.  Patient felt that this was not accurate, felt likely due to Parkinson's tremor.  She reports that generally under much better control, and review of records does support this.  Blood pressure at PCP visit on 06/18/2021 was 118/68.  Recent blood pressures in epic are reported below.  She understands to continue to monitor pressure at home if diastolic remains significantly elevated she will reach out to PCP for further management.  She understands that markedly uncontrolled hypertension on day of surgery could be cause for cancellation. BP Readings from Last 3 Encounters:  06/22/21 (!) 162/122  06/14/21 120/74  06/06/21 (!) 154/67     Preop labs reviewed, creatinine mildly elevated 1.34, mild anemia with hemoglobin 9.2, otherwise unremarkable.  EKG 06/22/2021: NSR.  Rate 72.  Nuclear stress 04/10/2017: Nuclear stress EF:  53%. Clinically and electrically negative for ischemia Probable normal perfusion and soft tissue attenuation No ischemia or scar This is a low risk study.   TTE 11/23/2015: - Left ventricle: The cavity size was normal. Wall thickness was    normal. Systolic function was normal. The estimated ejection    fraction was in the range of 55% to 60%. Wall motion was normal;    there were no regional wall motion abnormalities. Doppler    parameters are consistent with both elevated ventricular    end-diastolic filling pressure and elevated left atrial filling    pressure.  - Left atrium: The atrium was mildly dilated.  - Atrial septum: No defect or patent foramen ovale was identified.    Wynonia Musty [facility] Short Stay Center/Anesthesiology Phone [phone] 06/25/2021 10:08 AM

## 2021-06-25 NOTE — Anesthesia Preprocedure Evaluation (Addendum)
Anesthesia Evaluation  Patient identified by MRN, date of birth, ID band Patient awake    Reviewed: Allergy & Precautions, NPO status , Patient's Chart, lab work & pertinent test results, reviewed documented beta blocker date and time   Airway Mallampati: III  TM Distance: >3 FB Neck ROM: Full    Dental no notable dental hx. (+) Teeth Intact, Caps, Dental Advisory Given   Pulmonary neg pulmonary ROS,    Pulmonary exam normal breath sounds clear to auscultation       Cardiovascular hypertension, Pt. on medications and Pt. on home beta blockers Normal cardiovascular exam Rhythm:Regular Rate:Normal  EKG 06/22/21 NSR, baseline artifact  Echo 11/23/15 Left ventricle: The cavity size was normal. Wall thickness was normal. Systolic function was normal. The estimated ejection fraction was in the range of 55% to 60%. Wall motion was normal; there were no regional wall motion abnormalities. Dopplerparameters are consistent with both elevated ventricular end-diastolic filling pressure and elevated left atrial fillingpressure.  - Left atrium: The atrium was mildly dilated.  - Atrial septum: No defect or patent foramen ovale was identified   Neuro/Psych  Headaches, PSYCHIATRIC DISORDERS Depression Parkinson's Disease    GI/Hepatic negative GI ROS, Neg liver ROS,   Endo/Other  diabetes, Well Controlled, Type 2, Oral Hypoglycemic AgentsHyperlipidema  Renal/GU Renal InsufficiencyRenal disease  negative genitourinary   Musculoskeletal  (+) Arthritis , Osteoarthritis,  Right knee DJD Lumbar spinal stenosis S/P Lumbar Laminectomy   Abdominal   Peds  Hematology  (+) anemia ,   Anesthesia Other Findings   Reproductive/Obstetrics                           Anesthesia Physical Anesthesia Plan  ASA: 3  Anesthesia Plan: Spinal   Post-op Pain Management:  Regional for Post-op pain   Induction:  Intravenous  PONV Risk Score and Plan: 3 and Treatment may vary due to age or medical condition and Ondansetron  Airway Management Planned: Natural Airway and Simple Face Mask  Additional Equipment:   Intra-op Plan:   Post-operative Plan:   Informed Consent: I have reviewed the patients History and Physical, chart, labs and discussed the procedure including the risks, benefits and alternatives for the proposed anesthesia with the patient or authorized representative who has indicated his/her understanding and acceptance.     Dental advisory given  Plan Discussed with: CRNA and Anesthesiologist  Anesthesia Plan Comments: (PAT note by Karoline Caldwell, PA-C: Patient follows with neurology for history of persistent daily headaches and Parkinson's disease.  Last seen by Dr. Jannifer Franklin 05/29/2021.  Per note, "She is having daily headaches. She is on amitriptyline working up to 30 mg at night, currently on 20 mg at night. She is on 150 mg of Seroquel, and she has gabapentin. The patient in the past has had Botox therapies that have not been effective. She also has tremors on the left greater than right upper extremities and on the left lower extremity, a DaTscan done previously was felt to be consistent with Parkinson's disease. She has not really had much in the way of mobility issues with exception that she has severe arthritis of the right knee and will be undergoing knee surgery in the near future."  Patient had evaluation in 2018 for episode of atypical chest pain.  Nuclear stress was normal, nonischemic, low risk.  She had a previous echo in February 2017 that was also normal, EF 55 to 60%, no significant valvular abnormalities.  Blood  pressure noted to be elevated at preadmission testing appointment, 162/22.  Patient felt that this was not accurate, felt likely due to Parkinson's tremor.  She reports that generally under much better control, and review of records does support this.  Blood  pressure at PCP visit on 06/18/2021 was 118/68.  Recent blood pressures in epic are reported below.  She understands to continue to monitor pressure at home if diastolic remains significantly elevated she will reach out to PCP for further management.  She understands that markedly uncontrolled hypertension on day of surgery could be cause for cancellation. BP Readings from Last 3 Encounters: 06/22/21 (!) 162/122 06/14/21 120/74 06/06/21 (!) 154/67    Preop labs reviewed, creatinine mildly elevated 1.34, mild anemia with hemoglobin 9.2, otherwise unremarkable.  EKG 06/22/2021: NSR.  Rate 72.  Nuclear stress 04/10/2017: . Nuclear stress EF: 53%. . Clinically and electrically negative for ischemia . Probable normal perfusion and soft tissue attenuation No ischemia or scar . This is a low risk study.   TTE 11/23/2015: - Left ventricle: The cavity size was normal. Wall thickness was  normal. Systolic function was normal. The estimated ejection  fraction was in the range of 55% to 60%. Wall motion was normal;  there were no regional wall motion abnormalities. Doppler  parameters are consistent with both elevated ventricular  end-diastolic filling pressure and elevated left atrial filling  pressure.  - Left atrium: The atrium was mildly dilated.  - Atrial septum: No defect or patent foramen ovale was identified.   )      Anesthesia Quick Evaluation

## 2021-06-25 NOTE — Telephone Encounter (Signed)
I called the patient, the patient is having ongoing daily headaches at this point.  She is on Imitrex but only takes the 50 mg tablets, I will convert her to the 100 mg tablets.  She may take up to 2 pills daily, should not take Imitrex more than 3 days a week.

## 2021-06-25 NOTE — Telephone Encounter (Signed)
Pt called wanting to know if she can take more of her SUMAtriptan (IMITREX) 50 MG tablet. She states she took 1 on Friday, 1 on Saturday and 2 on Sunday now she has a very migraine. Pt requesting a call back.

## 2021-06-26 ENCOUNTER — Observation Stay (HOSPITAL_COMMUNITY)
Admission: RE | Admit: 2021-06-26 | Discharge: 2021-06-27 | Disposition: A | Discharge status: Home / Self Care | Payer: Medicare Other | Attending: Orthopaedic Surgery | Admitting: Orthopaedic Surgery

## 2021-06-26 ENCOUNTER — Ambulatory Visit (HOSPITAL_COMMUNITY): Payer: Medicare Other | Admitting: Anesthesiology

## 2021-06-26 ENCOUNTER — Observation Stay (HOSPITAL_COMMUNITY): Payer: Medicare Other

## 2021-06-26 ENCOUNTER — Ambulatory Visit (HOSPITAL_COMMUNITY): Payer: Medicare Other | Admitting: Physician Assistant

## 2021-06-26 ENCOUNTER — Encounter (HOSPITAL_COMMUNITY)
Admission: RE | Disposition: A | Discharge status: Home / Self Care | Payer: Self-pay | Source: Home / Self Care | Attending: Orthopaedic Surgery

## 2021-06-26 ENCOUNTER — Other Ambulatory Visit: Payer: Self-pay | Admitting: Physician Assistant

## 2021-06-26 DIAGNOSIS — G20 Parkinson's disease: Secondary | ICD-10-CM | POA: Insufficient documentation

## 2021-06-26 DIAGNOSIS — I10 Essential (primary) hypertension: Secondary | ICD-10-CM | POA: Diagnosis not present

## 2021-06-26 DIAGNOSIS — R7303 Prediabetes: Secondary | ICD-10-CM | POA: Insufficient documentation

## 2021-06-26 DIAGNOSIS — Z79899 Other long term (current) drug therapy: Secondary | ICD-10-CM | POA: Insufficient documentation

## 2021-06-26 DIAGNOSIS — R52 Pain, unspecified: Secondary | ICD-10-CM

## 2021-06-26 DIAGNOSIS — M1711 Unilateral primary osteoarthritis, right knee: Principal | ICD-10-CM | POA: Insufficient documentation

## 2021-06-26 DIAGNOSIS — Z96651 Presence of right artificial knee joint: Secondary | ICD-10-CM

## 2021-06-26 DIAGNOSIS — M25561 Pain in right knee: Secondary | ICD-10-CM | POA: Diagnosis present

## 2021-06-26 HISTORY — PX: TOTAL KNEE ARTHROPLASTY: SHX125

## 2021-06-26 LAB — GLUCOSE, CAPILLARY
Glucose-Capillary: 118 mg/dL — ABNORMAL HIGH (ref 70–99)
Glucose-Capillary: 99 mg/dL (ref 70–99)

## 2021-06-26 IMAGING — DX DG KNEE 1-2V PORT*R*
1 series · 2 of 2 positions shown · non-contrast
Comparison: 04/09/2021

CLINICAL DATA: Right knee arthroplasty

EXAM:
PORTABLE RIGHT KNEE - 1-2 VIEW

[Series 1: knee · 0.14mm/px · 2 of 2 slices shown]
[im 1/2]
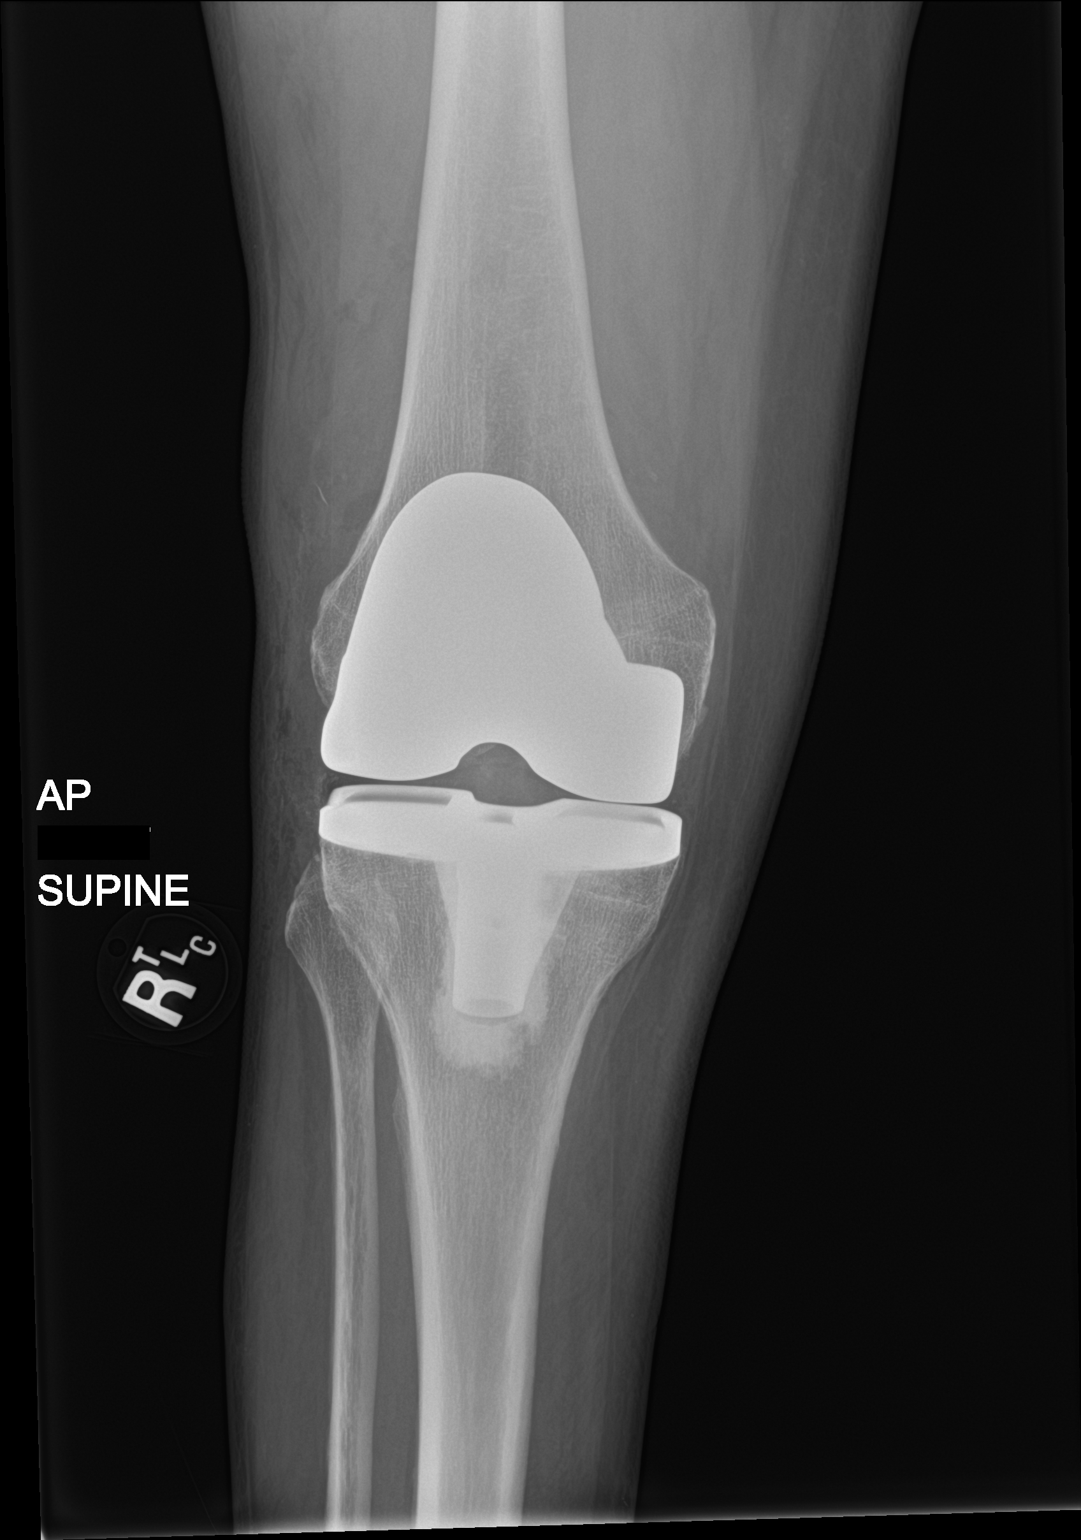
[im 2/2]
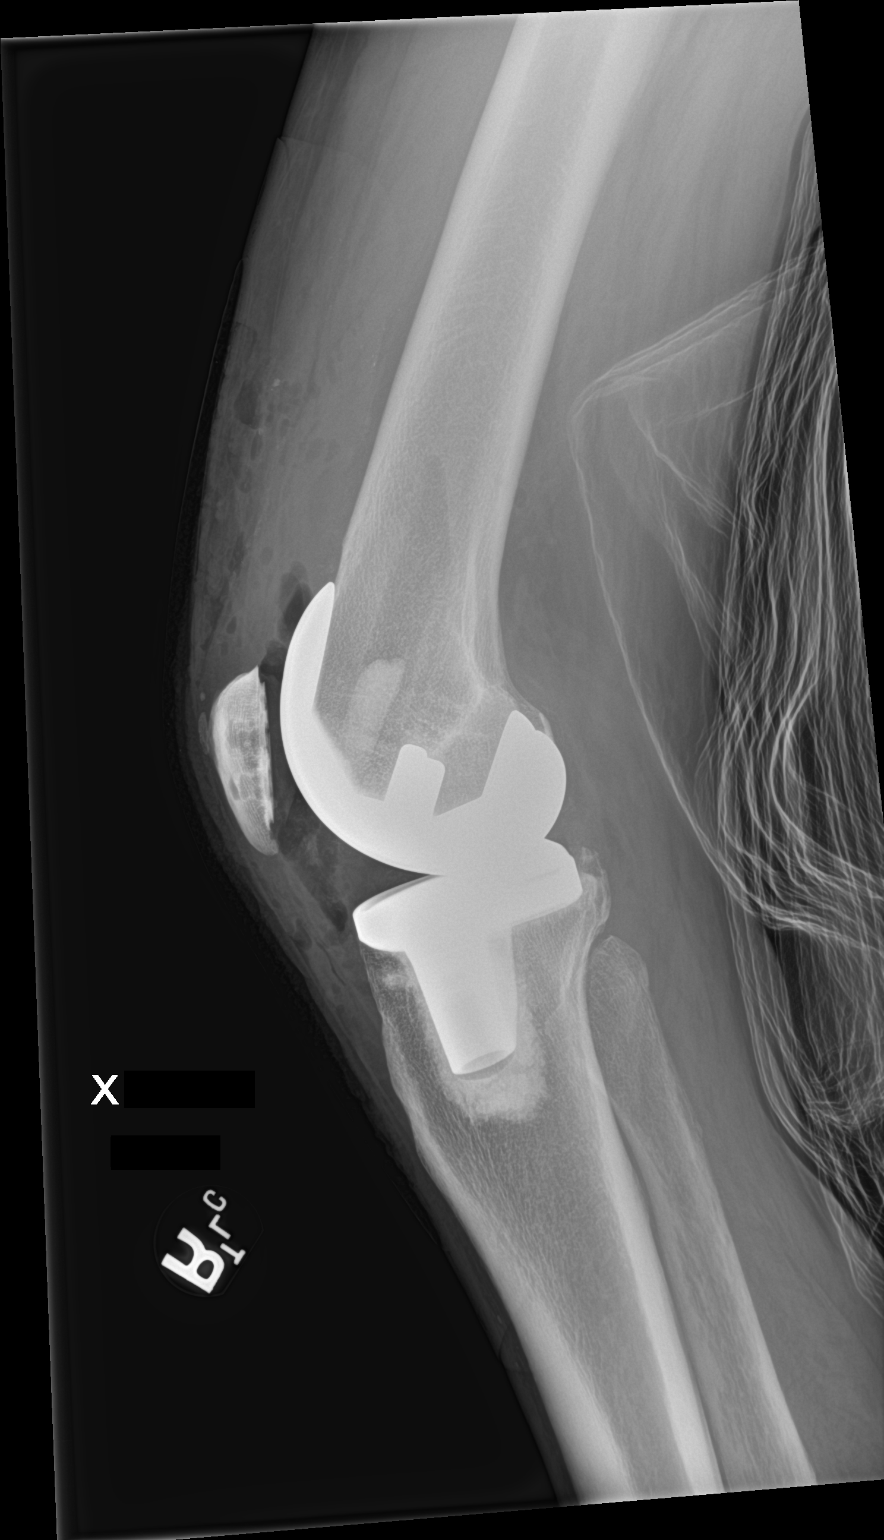

[2 of 2 positions shown; findings below may reference images not displayed]

FINDINGS: Interval postsurgical changes from right total knee arthroplasty.
Arthroplasty components are in their expected alignment. No
periprosthetic fracture or evidence of other complication. Expected
postoperative changes within the overlying soft tissues.
IMPRESSION: Interval postsurgical changes from right total knee arthroplasty.

## 2021-06-26 SURGERY — ARTHROPLASTY, KNEE, TOTAL
Anesthesia: Spinal | Site: Knee | Laterality: Right

## 2021-06-26 MED ORDER — BUPIVACAINE-MELOXICAM ER 400-12 MG/14ML IJ SOLN
INTRAMUSCULAR | Status: DC | PRN
  Administered 2021-06-26: 400 mg

## 2021-06-26 MED ORDER — TRANEXAMIC ACID-NACL 1000-0.7 MG/100ML-% IV SOLN
1000.0000 mg | Freq: Once | INTRAVENOUS | Status: AC
  Administered 2021-06-26: 1000 mg via INTRAVENOUS
  Filled 2021-06-26: qty 100

## 2021-06-26 MED ORDER — FENTANYL CITRATE (PF) 100 MCG/2ML IJ SOLN: 25.0000 ug | INTRAMUSCULAR | Status: DC | PRN

## 2021-06-26 MED ORDER — SUMATRIPTAN SUCCINATE 100 MG PO TABS
100.0000 mg | ORAL_TABLET | Freq: Two times a day (BID) | ORAL | Status: DC | PRN
  Filled 2021-06-26: qty 1

## 2021-06-26 MED ORDER — PROPOFOL 10 MG/ML IV BOLUS
INTRAVENOUS | Status: AC
  Filled 2021-06-26: qty 20

## 2021-06-26 MED ORDER — CLINDAMYCIN PHOSPHATE 900 MG/50ML IV SOLN
900.0000 mg | INTRAVENOUS | Status: AC
  Administered 2021-06-26: 900 mg via INTRAVENOUS
  Filled 2021-06-26: qty 50

## 2021-06-26 MED ORDER — ACETAMINOPHEN 325 MG PO TABS
325.0000 mg | ORAL_TABLET | Freq: Four times a day (QID) | ORAL | Status: DC | PRN
  Administered 2021-06-27: 650 mg via ORAL
  Filled 2021-06-26: qty 2

## 2021-06-26 MED ORDER — MIDAZOLAM HCL 2 MG/2ML IJ SOLN
INTRAMUSCULAR | Status: AC
  Filled 2021-06-26: qty 2

## 2021-06-26 MED ORDER — CHLORHEXIDINE GLUCONATE 0.12 % MT SOLN
15.0000 mL | Freq: Once | OROMUCOSAL | Status: AC
  Administered 2021-06-26: 15 mL via OROMUCOSAL
  Filled 2021-06-26: qty 15

## 2021-06-26 MED ORDER — ROPIVACAINE HCL 5 MG/ML IJ SOLN
INTRAMUSCULAR | Status: DC | PRN
  Administered 2021-06-26: 30 mL via PERINEURAL

## 2021-06-26 MED ORDER — ONDANSETRON HCL 4 MG PO TABS: 4.0000 mg | ORAL_TABLET | Freq: Four times a day (QID) | ORAL | Status: DC | PRN

## 2021-06-26 MED ORDER — ONDANSETRON HCL 4 MG/2ML IJ SOLN
4.0000 mg | Freq: Once | INTRAMUSCULAR | Status: AC | PRN
  Administered 2021-06-26: 4 mg via INTRAVENOUS

## 2021-06-26 MED ORDER — AMLODIPINE BESYLATE 5 MG PO TABS
5.0000 mg | ORAL_TABLET | Freq: Every day | ORAL | Status: DC
  Administered 2021-06-27: 5 mg via ORAL
  Filled 2021-06-26 (×2): qty 1

## 2021-06-26 MED ORDER — INSULIN ASPART 100 UNIT/ML IJ SOLN
0.0000 [IU] | Freq: Three times a day (TID) | INTRAMUSCULAR | Status: DC
Start: 2021-06-26 — End: 2021-06-27

## 2021-06-26 MED ORDER — MIDAZOLAM HCL 5 MG/5ML IJ SOLN
INTRAMUSCULAR | Status: DC | PRN
Start: 2021-06-26 — End: 2021-06-26
  Administered 2021-06-26: 2 mg via INTRAVENOUS

## 2021-06-26 MED ORDER — SODIUM CHLORIDE 0.9 % IV SOLN: INTRAVENOUS | Status: DC

## 2021-06-26 MED ORDER — ONDANSETRON HCL 4 MG/2ML IJ SOLN
INTRAMUSCULAR | Status: AC
  Filled 2021-06-26: qty 2

## 2021-06-26 MED ORDER — DOCUSATE SODIUM 100 MG PO CAPS
100.0000 mg | ORAL_CAPSULE | Freq: Two times a day (BID) | ORAL | Status: DC
  Administered 2021-06-26: 100 mg via ORAL
  Filled 2021-06-26 (×3): qty 1

## 2021-06-26 MED ORDER — ORAL CARE MOUTH RINSE: 15.0000 mL | Freq: Once | OROMUCOSAL | Status: AC

## 2021-06-26 MED ORDER — METOCLOPRAMIDE HCL 5 MG/ML IJ SOLN: 5.0000 mg | Freq: Three times a day (TID) | INTRAMUSCULAR | Status: DC | PRN

## 2021-06-26 MED ORDER — MENTHOL 3 MG MT LOZG
1.0000 | LOZENGE | OROMUCOSAL | Status: DC | PRN
  Filled 2021-06-26: qty 9

## 2021-06-26 MED ORDER — BUPIVACAINE-MELOXICAM ER 400-12 MG/14ML IJ SOLN
INTRAMUSCULAR | Status: AC
  Filled 2021-06-26: qty 1

## 2021-06-26 MED ORDER — LIDOCAINE HCL (PF) 2 % IJ SOLN
INTRAMUSCULAR | Status: AC
  Filled 2021-06-26: qty 5

## 2021-06-26 MED ORDER — QUETIAPINE FUMARATE 50 MG PO TABS
150.0000 mg | ORAL_TABLET | Freq: Every day | ORAL | Status: DC
  Administered 2021-06-26: 150 mg via ORAL
  Filled 2021-06-26 (×2): qty 1

## 2021-06-26 MED ORDER — HYDROMORPHONE HCL 1 MG/ML IJ SOLN: 0.5000 mg | INTRAMUSCULAR | Status: DC | PRN

## 2021-06-26 MED ORDER — AMITRIPTYLINE HCL 10 MG PO TABS
30.0000 mg | ORAL_TABLET | Freq: Every day | ORAL | Status: DC
  Administered 2021-06-26: 30 mg via ORAL
  Filled 2021-06-26 (×2): qty 3

## 2021-06-26 MED ORDER — DOCUSATE SODIUM 100 MG PO CAPS: 100.0000 mg | ORAL_CAPSULE | Freq: Every day | ORAL | 2 refills | Status: DC | PRN

## 2021-06-26 MED ORDER — FENTANYL CITRATE (PF) 250 MCG/5ML IJ SOLN
INTRAMUSCULAR | Status: DC | PRN
  Administered 2021-06-26 (×2): 50 ug via INTRAVENOUS
  Administered 2021-06-26: 100 ug via INTRAVENOUS
  Administered 2021-06-26: 50 ug via INTRAVENOUS

## 2021-06-26 MED ORDER — LACTATED RINGERS IV SOLN: INTRAVENOUS | Status: DC

## 2021-06-26 MED ORDER — TRANEXAMIC ACID 1000 MG/10ML IV SOLN
INTRAVENOUS | Status: DC | PRN
  Administered 2021-06-26: 2000 mg via TOPICAL

## 2021-06-26 MED ORDER — LORAZEPAM 2 MG/ML IJ SOLN
INTRAMUSCULAR | Status: AC
  Filled 2021-06-26: qty 1

## 2021-06-26 MED ORDER — VANCOMYCIN HCL 1000 MG IV SOLR
INTRAVENOUS | Status: DC | PRN
  Administered 2021-06-26: 1000 mg via TOPICAL

## 2021-06-26 MED ORDER — POVIDONE-IODINE 10 % EX SWAB
2.0000 "application " | Freq: Once | CUTANEOUS | Status: AC
  Administered 2021-06-26: 2 via TOPICAL

## 2021-06-26 MED ORDER — ASPIRIN EC 81 MG PO TBEC: 81.0000 mg | DELAYED_RELEASE_TABLET | Freq: Two times a day (BID) | ORAL | 0 refills | Status: DC

## 2021-06-26 MED ORDER — PROPOFOL 10 MG/ML IV BOLUS
INTRAVENOUS | Status: DC | PRN
  Administered 2021-06-26: 10 mg via INTRAVENOUS
  Administered 2021-06-26 (×2): 20 mg via INTRAVENOUS

## 2021-06-26 MED ORDER — CEFAZOLIN SODIUM-DEXTROSE 2-4 GM/100ML-% IV SOLN
2.0000 g | Freq: Four times a day (QID) | INTRAVENOUS | Status: AC
  Administered 2021-06-26 (×2): 2 g via INTRAVENOUS
  Filled 2021-06-26 (×2): qty 100

## 2021-06-26 MED ORDER — LORAZEPAM 2 MG/ML IJ SOLN
0.5000 mg | Freq: Once | INTRAMUSCULAR | Status: AC
  Administered 2021-06-26: 0.5 mg via INTRAVENOUS

## 2021-06-26 MED ORDER — OXYCODONE-ACETAMINOPHEN 5-325 MG PO TABS: 1.0000 | ORAL_TABLET | Freq: Four times a day (QID) | ORAL | 0 refills | Status: DC | PRN

## 2021-06-26 MED ORDER — METHOCARBAMOL 500 MG PO TABS: 500.0000 mg | ORAL_TABLET | Freq: Four times a day (QID) | ORAL | Status: DC | PRN

## 2021-06-26 MED ORDER — TRANEXAMIC ACID-NACL 1000-0.7 MG/100ML-% IV SOLN
1000.0000 mg | INTRAVENOUS | Status: AC
  Administered 2021-06-26: 1000 mg via INTRAVENOUS
  Filled 2021-06-26: qty 100

## 2021-06-26 MED ORDER — FENTANYL CITRATE (PF) 250 MCG/5ML IJ SOLN
INTRAMUSCULAR | Status: AC
  Filled 2021-06-26: qty 5

## 2021-06-26 MED ORDER — METHOCARBAMOL 500 MG PO TABS: 500.0000 mg | ORAL_TABLET | Freq: Two times a day (BID) | ORAL | 2 refills | Status: DC | PRN

## 2021-06-26 MED ORDER — GABAPENTIN 300 MG PO CAPS
300.0000 mg | ORAL_CAPSULE | Freq: Three times a day (TID) | ORAL | Status: DC | PRN
  Administered 2021-06-26: 300 mg via ORAL
  Filled 2021-06-26: qty 1

## 2021-06-26 MED ORDER — OXYCODONE HCL 5 MG PO TABS: 10.0000 mg | ORAL_TABLET | ORAL | Status: DC | PRN

## 2021-06-26 MED ORDER — INSULIN ASPART 100 UNIT/ML IJ SOLN: 0.0000 [IU] | Freq: Every day | INTRAMUSCULAR | Status: DC

## 2021-06-26 MED ORDER — 0.9 % SODIUM CHLORIDE (POUR BTL) OPTIME
TOPICAL | Status: DC | PRN
  Administered 2021-06-26: 1000 mL

## 2021-06-26 MED ORDER — OXYCODONE HCL 5 MG/5ML PO SOLN
5.0000 mg | Freq: Once | ORAL | Status: DC | PRN
Start: 2021-06-26 — End: 2021-06-26

## 2021-06-26 MED ORDER — METOCLOPRAMIDE HCL 5 MG PO TABS: 5.0000 mg | ORAL_TABLET | Freq: Three times a day (TID) | ORAL | Status: DC | PRN

## 2021-06-26 MED ORDER — OXYCODONE HCL 5 MG PO TABS: 5.0000 mg | ORAL_TABLET | Freq: Once | ORAL | Status: DC | PRN

## 2021-06-26 MED ORDER — ASPIRIN 81 MG PO CHEW
81.0000 mg | CHEWABLE_TABLET | Freq: Two times a day (BID) | ORAL | Status: DC
  Administered 2021-06-26 – 2021-06-27 (×2): 81 mg via ORAL
  Filled 2021-06-26 (×2): qty 1

## 2021-06-26 MED ORDER — LIDOCAINE 2% (20 MG/ML) 5 ML SYRINGE
INTRAMUSCULAR | Status: DC | PRN
  Administered 2021-06-26: 20 mg via INTRAVENOUS

## 2021-06-26 MED ORDER — ONDANSETRON HCL 4 MG/2ML IJ SOLN
INTRAMUSCULAR | Status: DC | PRN
  Administered 2021-06-26: 4 mg via INTRAVENOUS

## 2021-06-26 MED ORDER — ACETAMINOPHEN 500 MG PO TABS
1000.0000 mg | ORAL_TABLET | Freq: Four times a day (QID) | ORAL | Status: AC
  Administered 2021-06-26 – 2021-06-27 (×3): 1000 mg via ORAL
  Filled 2021-06-26 (×3): qty 2

## 2021-06-26 MED ORDER — PHENYLEPHRINE HCL-NACL 20-0.9 MG/250ML-% IV SOLN
INTRAVENOUS | Status: DC | PRN
  Administered 2021-06-26: 20 ug/min via INTRAVENOUS

## 2021-06-26 MED ORDER — OXYCODONE HCL 5 MG PO TABS
5.0000 mg | ORAL_TABLET | ORAL | Status: DC | PRN
  Administered 2021-06-27: 10 mg via ORAL
  Filled 2021-06-26: qty 2

## 2021-06-26 MED ORDER — CARVEDILOL 12.5 MG PO TABS
12.5000 mg | ORAL_TABLET | Freq: Two times a day (BID) | ORAL | Status: DC
  Administered 2021-06-26 – 2021-06-27 (×2): 12.5 mg via ORAL
  Filled 2021-06-26 (×3): qty 1

## 2021-06-26 MED ORDER — ONDANSETRON HCL 4 MG/2ML IJ SOLN: 4.0000 mg | Freq: Four times a day (QID) | INTRAMUSCULAR | Status: DC | PRN

## 2021-06-26 MED ORDER — GLIMEPIRIDE 2 MG PO TABS
1.0000 mg | ORAL_TABLET | Freq: Every day | ORAL | Status: DC
  Administered 2021-06-27: 1 mg via ORAL
  Filled 2021-06-26: qty 1

## 2021-06-26 MED ORDER — PROPOFOL 500 MG/50ML IV EMUL
INTRAVENOUS | Status: DC | PRN
  Administered 2021-06-26: 50 ug/kg/min via INTRAVENOUS

## 2021-06-26 MED ORDER — VENLAFAXINE HCL ER 37.5 MG PO CP24
37.5000 mg | ORAL_CAPSULE | Freq: Every day | ORAL | Status: DC
  Filled 2021-06-26: qty 1

## 2021-06-26 MED ORDER — VANCOMYCIN HCL 1000 MG IV SOLR
INTRAVENOUS | Status: AC
  Filled 2021-06-26: qty 20

## 2021-06-26 MED ORDER — BUPIVACAINE IN DEXTROSE 0.75-8.25 % IT SOLN
INTRATHECAL | Status: DC | PRN
  Administered 2021-06-26: 1.6 mL via INTRATHECAL

## 2021-06-26 MED ORDER — SODIUM CHLORIDE 0.9 % IR SOLN
Status: DC | PRN
  Administered 2021-06-26: 1000 mL

## 2021-06-26 MED ORDER — PHENOL 1.4 % MT LIQD: 1.0000 | OROMUCOSAL | Status: DC | PRN

## 2021-06-26 MED ORDER — ONDANSETRON HCL 4 MG PO TABS: 4.0000 mg | ORAL_TABLET | Freq: Three times a day (TID) | ORAL | 0 refills | Status: DC | PRN

## 2021-06-26 MED ORDER — DEXAMETHASONE SODIUM PHOSPHATE 10 MG/ML IJ SOLN
10.0000 mg | Freq: Once | INTRAMUSCULAR | Status: AC
  Administered 2021-06-27: 10 mg via INTRAVENOUS
  Filled 2021-06-26: qty 1

## 2021-06-26 MED ORDER — METHOCARBAMOL 1000 MG/10ML IJ SOLN
500.0000 mg | Freq: Four times a day (QID) | INTRAVENOUS | Status: DC | PRN
  Filled 2021-06-26: qty 5

## 2021-06-26 SURGICAL SUPPLY — 84 items
ADH SKN CLS APL DERMABOND .7 (GAUZE/BANDAGES/DRESSINGS) ×1
ALCOHOL 70% 16 OZ (MISCELLANEOUS) ×2 IMPLANT
BAG COUNTER SPONGE SURGICOUNT (BAG) IMPLANT
BAG DECANTER FOR FLEXI CONT (MISCELLANEOUS) ×2 IMPLANT
BAG SPNG CNTER NS LX DISP (BAG)
BANDAGE ESMARK 6X9 LF (GAUZE/BANDAGES/DRESSINGS) IMPLANT
BLADE SAG 18X100X1.27 (BLADE) ×2 IMPLANT
BNDG CMPR 9X6 STRL LF SNTH (GAUZE/BANDAGES/DRESSINGS)
BNDG ESMARK 6X9 LF (GAUZE/BANDAGES/DRESSINGS)
BOWL SMART MIX CTS (DISPOSABLE) ×2 IMPLANT
BSPLAT TIB 5D E CMNT KN RT (Knees) ×1 IMPLANT
CEMENT BONE REFOBACIN R1X40 US (Cement) ×2 IMPLANT
CLSR STERI-STRIP ANTIMIC 1/2X4 (GAUZE/BANDAGES/DRESSINGS) ×4 IMPLANT
COMP FEM CMT PERSONA SZ7 RT (Joint) ×2 IMPLANT
COMPONENT FEM CMT PRSONA SZ7RT (Joint) IMPLANT
COOLER ICEMAN CLASSIC (MISCELLANEOUS) ×2 IMPLANT
COVER SURGICAL LIGHT HANDLE (MISCELLANEOUS) ×2 IMPLANT
CUFF TOURN SGL QUICK 34 (TOURNIQUET CUFF) ×2
CUFF TOURN SGL QUICK 42 (TOURNIQUET CUFF) IMPLANT
CUFF TRNQT CYL 34X4.125X (TOURNIQUET CUFF) ×1 IMPLANT
DERMABOND ADVANCED (GAUZE/BANDAGES/DRESSINGS) ×1
DERMABOND ADVANCED .7 DNX12 (GAUZE/BANDAGES/DRESSINGS) ×1 IMPLANT
DRAPE EXTREMITY T 121X128X90 (DISPOSABLE) ×2 IMPLANT
DRAPE HALF SHEET 40X57 (DRAPES) ×2 IMPLANT
DRAPE INCISE IOBAN 66X45 STRL (DRAPES) IMPLANT
DRAPE ORTHO SPLIT 77X108 STRL (DRAPES) ×4
DRAPE POUCH INSTRU U-SHP 10X18 (DRAPES) ×2 IMPLANT
DRAPE SURG ORHT 6 SPLT 77X108 (DRAPES) ×2 IMPLANT
DRAPE U-SHAPE 47X51 STRL (DRAPES) ×4 IMPLANT
DRESSING AQUACEL AG SP 3.5X10 (GAUZE/BANDAGES/DRESSINGS) IMPLANT
DRSG AQUACEL AG ADV 3.5X10 (GAUZE/BANDAGES/DRESSINGS) ×2 IMPLANT
DRSG AQUACEL AG SP 3.5X10 (GAUZE/BANDAGES/DRESSINGS) ×2
DURAPREP 26ML APPLICATOR (WOUND CARE) ×6 IMPLANT
ELECT CAUTERY BLADE 6.4 (BLADE) ×2 IMPLANT
ELECT REM PT RETURN 9FT ADLT (ELECTROSURGICAL) ×2
ELECTRODE REM PT RTRN 9FT ADLT (ELECTROSURGICAL) ×1 IMPLANT
GLOVE SURG NEOP MICRO LF SZ7.5 (GLOVE) ×6 IMPLANT
GLOVE SURG SYN 7.5  E (GLOVE) ×8
GLOVE SURG SYN 7.5 E (GLOVE) ×4 IMPLANT
GLOVE SURG SYN 7.5 PF PI (GLOVE) ×4 IMPLANT
GLOVE SURG UNDER LTX SZ7.5 (GLOVE) ×6 IMPLANT
GLOVE SURG UNDER POLY LF SZ7 (GLOVE) ×10 IMPLANT
GOWN STRL REIN XL XLG (GOWN DISPOSABLE) ×2 IMPLANT
GOWN STRL REUS W/ TWL LRG LVL3 (GOWN DISPOSABLE) ×1 IMPLANT
GOWN STRL REUS W/TWL LRG LVL3 (GOWN DISPOSABLE) ×2
HANDPIECE INTERPULSE COAX TIP (DISPOSABLE) ×2
HDLS TROCR DRIL PIN KNEE 75 (PIN) ×4
HOOD PEEL AWAY FLYTE STAYCOOL (MISCELLANEOUS) ×4 IMPLANT
INSERT TIB ASF SZ 6-7/EF 10 RT (Insert) ×1 IMPLANT
JET LAVAGE IRRISEPT WOUND (IRRIGATION / IRRIGATOR) ×2
KIT BASIN OR (CUSTOM PROCEDURE TRAY) ×2 IMPLANT
KIT TURNOVER KIT B (KITS) ×2 IMPLANT
LAVAGE JET IRRISEPT WOUND (IRRIGATION / IRRIGATOR) ×1 IMPLANT
MANIFOLD NEPTUNE II (INSTRUMENTS) ×2 IMPLANT
MARKER SKIN DUAL TIP RULER LAB (MISCELLANEOUS) ×2 IMPLANT
NDL SPNL 18GX3.5 QUINCKE PK (NEEDLE) ×1 IMPLANT
NEEDLE SPNL 18GX3.5 QUINCKE PK (NEEDLE) ×2 IMPLANT
NS IRRIG 1000ML POUR BTL (IV SOLUTION) ×2 IMPLANT
PACK TOTAL JOINT (CUSTOM PROCEDURE TRAY) ×2 IMPLANT
PAD ARMBOARD 7.5X6 YLW CONV (MISCELLANEOUS) ×4 IMPLANT
PAD COLD SHLDR WRAP-ON (PAD) ×2 IMPLANT
PIN DRILL HDLS TROCAR 75 4PK (PIN) IMPLANT
SAW OSC TIP CART 19.5X105X1.3 (SAW) ×2 IMPLANT
SCREW FEMALE HEX FIX 25X2.5 (ORTHOPEDIC DISPOSABLE SUPPLIES) ×1 IMPLANT
SET HNDPC FAN SPRY TIP SCT (DISPOSABLE) ×1 IMPLANT
STAPLER VISISTAT 35W (STAPLE) IMPLANT
STEM POLY PAT PLY 32M KNEE (Knees) ×1 IMPLANT
STEM TIBIA 5 DEG SZ E R KNEE (Knees) IMPLANT
SUCTION FRAZIER HANDLE 10FR (MISCELLANEOUS) ×2
SUCTION TUBE FRAZIER 10FR DISP (MISCELLANEOUS) ×1 IMPLANT
SUT ETHILON 2 0 FS 18 (SUTURE) IMPLANT
SUT MNCRL AB 4-0 PS2 18 (SUTURE) IMPLANT
SUT VIC AB 0 CT1 27 (SUTURE) ×4
SUT VIC AB 0 CT1 27XBRD ANBCTR (SUTURE) ×2 IMPLANT
SUT VIC AB 1 CTX 27 (SUTURE) ×6 IMPLANT
SUT VIC AB 2-0 CT1 27 (SUTURE) ×8
SUT VIC AB 2-0 CT1 TAPERPNT 27 (SUTURE) ×4 IMPLANT
SYR 50ML LL SCALE MARK (SYRINGE) ×4 IMPLANT
TIBIA STEM 5 DEG SZ E R KNEE (Knees) ×2 IMPLANT
TOWEL GREEN STERILE (TOWEL DISPOSABLE) ×2 IMPLANT
TOWEL GREEN STERILE FF (TOWEL DISPOSABLE) ×2 IMPLANT
TRAY CATH 16FR W/PLASTIC CATH (SET/KITS/TRAYS/PACK) IMPLANT
UNDERPAD 30X36 HEAVY ABSORB (UNDERPADS AND DIAPERS) ×2 IMPLANT
YANKAUER SUCT BULB TIP NO VENT (SUCTIONS) ×2 IMPLANT

## 2021-06-26 NOTE — Evaluation (Signed)
Physical Therapy Evaluation Patient Details Name: Kristin Gilmore MRN: [id number] DOB: 1951-09-14 Today's Date: 06/26/2021  History of Present Illness  Pt is a 70 y/o female s/p R TKA on 9/27. PMH includes HTN, Parkinson's disease, migraine and lumbar surgery.  Clinical Impression  Pt admitted secondary to problem above with deficits below. Pt requiring min A for bed mobility and transfers this session. Pt became increasingly dizzy and required return to sitting. Pt with poor safety awareness and attempting to stand a second time without AD and support from PT. Max education provided about safety with mobility. Pt also becoming agitated stating she had to urinate and became more agitated when PT stated she had a catheter. Notified RN that pt requesting to remove catheter. Pt reports her husband and neighbor can assist as needed at d/c. Will continue to follow acutely.        Recommendations for follow up therapy are one component of a multi-disciplinary discharge planning process, led by the attending physician.  Recommendations may be updated based on patient status, additional functional criteria and insurance authorization.  Follow Up Recommendations Follow surgeon's recommendation for DC plan and follow-up therapies;Supervision for mobility/OOB    Equipment Recommendations  None recommended by PT    Recommendations for Other Services       Precautions / Restrictions Precautions Precautions: Knee Precaution Booklet Issued: No Precaution Comments: Verbally reviewed knee precautions Restrictions Weight Bearing Restrictions: Yes RLE Weight Bearing: Weight bearing as tolerated      Mobility  Bed Mobility Overal bed mobility: Needs Assistance Bed Mobility: Supine to Sit;Sit to Supine     Supine to sit: Min assist Sit to supine: Min assist   General bed mobility comments: Min A for RLE and trunk assist.    Transfers Overall transfer level: Needs assistance Equipment  used: Rolling walker (2 wheeled) Transfers: Sit to/from Stand Sit to Stand: Min assist         General transfer comment: Min A for steadying assist to stand. Upon standing, pt becoming very dizzy and demonstrating posterior lean, so returned pt to sitting. Pt reports hx of syncope. Pt with poor safety awareness and attempting to stand again without AD and without assist from staff. PT had to assist pt back into sitting and pt becoming agitated about not being able to get up to the bathroom. Educated that pt had a catheter, but pt becoming more agitated. RN notified.  Ambulation/Gait                Stairs            Wheelchair Mobility    Modified Rankin (Stroke Patients Only)       Balance Overall balance assessment: Needs assistance Sitting-balance support: No upper extremity supported;Feet supported Sitting balance-Leahy Scale: Fair     Standing balance support: Bilateral upper extremity supported;During functional activity Standing balance-Leahy Scale: Poor Standing balance comment: Reliant on UE and external support                             Pertinent Vitals/Pain Pain Assessment: Faces Faces Pain Scale: Hurts little more Pain Location: R knee Pain Descriptors / Indicators: Aching;Operative site guarding Pain Intervention(s): Limited activity within patient's tolerance;Monitored during session;Repositioned    Home Living Family/patient expects to be discharged to:: Private residence Living Arrangements: Spouse/significant other Available Help at Discharge: Family;Available 24 hours/day;Neighbor Type of Home: House Home Access: Stairs to enter Entrance Stairs-Rails: Right;Left;Can reach both  Entrance Stairs-Number of Steps: 4-5 Home Layout: One level Home Equipment: Grab bars - tub/shower;Grab bars - toilet;Walker - 2 wheels;Shower seat      Prior Function Level of Independence: Independent with assistive device(s)         Comments:  Uses RW for ambulation     Hand Dominance        Extremity/Trunk Assessment   Upper Extremity Assessment Upper Extremity Assessment: Defer to OT evaluation (tremors noted secondary to parkinson's)    Lower Extremity Assessment Lower Extremity Assessment: Generalized weakness;RLE deficits/detail RLE Deficits / Details: Deficits consistent with post op pain and weakness.    Cervical / Trunk Assessment Cervical / Trunk Assessment: Kyphotic (back pain at baseline)  Communication   Communication: No difficulties  Cognition Arousal/Alertness: Awake/alert Behavior During Therapy: Agitated;Impulsive Overall Cognitive Status: No family/caregiver present to determine baseline cognitive functioning                                 General Comments: Pt very fixated on wanting to have her catheter out. Would state "I have to go to the bathroom" and PT would reinforce that she had a catheter. Pt becoming agitated and stated "You've told me that 6 times, and I don't care, I want it out". Very poor safety awareness and attempting to get up without use of AD and assist of staff. Assisted pt back to sitting. Pt also yelling for PT to stop talking so loud, even though PT and tech were talking in calm manner, and stated it was giving her a migraine. Very argumentative toward end of session. RN notified.      General Comments General comments (skin integrity, edema, etc.): No family present    Exercises     Assessment/Plan    PT Assessment Patient needs continued PT services  PT Problem List Decreased strength;Decreased activity tolerance;Decreased range of motion;Decreased balance;Decreased mobility;Decreased knowledge of use of DME;Decreased safety awareness;Decreased knowledge of precautions;Pain       PT Treatment Interventions DME instruction;Gait training;Stair training;Functional mobility training;Therapeutic activities;Therapeutic exercise;Balance training;Patient/family  education    PT Goals (Current goals can be found in the Care Plan section)  Acute Rehab PT Goals Patient Stated Goal: to go to the bathroom PT Goal Formulation: With patient Time For Goal Achievement: 07/10/21 Potential to Achieve Goals: Good    Frequency 7X/week   Barriers to discharge        Co-evaluation               AM-PAC PT "6 Clicks" Mobility  Outcome Measure Help needed turning from your back to your side while in a flat bed without using bedrails?: A Little Help needed moving from lying on your back to sitting on the side of a flat bed without using bedrails?: A Little Help needed moving to and from a bed to a chair (including a wheelchair)?: A Little Help needed standing up from a chair using your arms (e.g., wheelchair or bedside chair)?: A Little Help needed to walk in hospital room?: A Lot Help needed climbing 3-5 steps with a railing? : A Lot 6 Click Score: 16    End of Session Equipment Utilized During Treatment: Gait belt Activity Tolerance: Treatment limited secondary to medical complications (Comment) (dizziness) Patient left: in bed;with call bell/phone within reach Nurse Communication: Mobility status PT Visit Diagnosis: Unsteadiness on feet (R26.81);Muscle weakness (generalized) (M62.81);Difficulty in walking, not elsewhere classified (R26.2);Pain Pain - Right/Left: Right  Pain - part of body: Knee    Time: 2449-7530 PT Time Calculation (min) (ACUTE ONLY): 27 min   Charges:   PT Evaluation $PT Eval Low Complexity: 1 Low PT Treatments $Therapeutic Activity: 8-22 mins        Lou Miner, DPT  Acute Rehabilitation Services  Pager: [phone] Office: [phone]   Rudean Hitt 06/26/2021, 3:54 PM

## 2021-06-26 NOTE — Transfer of Care (Signed)
Immediate Anesthesia Transfer of Care Note  Patient: Kristin Gilmore  Procedure(s) Performed: RIGHT TOTAL KNEE ARTHROPLASTY (Right: Knee)  Patient Location: PACU  Anesthesia Type:Spinal and MAC combined with regional for post-op pain  Level of Consciousness: awake  Airway & Oxygen Therapy: Patient Spontanous Breathing  Post-op Assessment: Report given to RN and Post -op Vital signs reviewed and stable  Post vital signs: Reviewed and stable  Last Vitals:  Vitals Value Taken Time  BP 98/51 06/26/21 1009  Temp    Pulse 46 06/26/21 1011  Resp 17 06/26/21 1011  SpO2 89 % 06/26/21 1011  Vitals shown include unvalidated device data.  Last Pain:  Vitals:   06/26/21 0624  TempSrc:   PainSc: 6       Patients Stated Pain Goal: 2 (21/11/73 5670)  Complications: No notable events documented.

## 2021-06-26 NOTE — Op Note (Signed)
Total Knee Arthroplasty Procedure Note  Preoperative diagnosis: Right knee osteoarthritis, valgus deformity  Postoperative diagnosis:same  Operative procedure: Right total knee arthroplasty. CPT [phone]  Surgeon: N. Eduard Roux, MD  Assist: Madalyn Rob, PA-C; necessary for the timely completion of procedure and due to complexity of procedure.  Anesthesia: Spinal, regional, local  Tourniquet time: see anesthesia record  Implants used: Zimmer persona Femur: CR 7 Tibia: E Patella: 32 mm Polyethylene: 10 mm, MC  Indication: Kristin Gilmore is a 70 y.o. year old female with a history of knee pain. Having failed conservative management, the patient elected to proceed with a total knee arthroplasty.  We have reviewed the risk and benefits of the surgery and they elected to proceed after voicing understanding.  Procedure:  After informed consent was obtained and understanding of the risk were voiced including but not limited to bleeding, infection, damage to surrounding structures including nerves and vessels, blood clots, leg length inequality and the failure to achieve desired results, the operative extremity was marked with verbal confirmation of the patient in the holding area.   The patient was then brought to the operating room and transported to the operating room table in the supine position.  A tourniquet was applied to the operative extremity around the upper thigh. The operative limb was then prepped and draped in the usual sterile fashion and preoperative antibiotics were administered.  A time out was performed prior to the start of surgery confirming the correct extremity, preoperative antibiotic administration, as well as team members, implants and instruments available for the case. Correct surgical site was also confirmed with preoperative radiographs. The limb was then elevated for exsanguination and the tourniquet was inflated. A midline incision was made and a  standard medial parapatellar approach was performed.  The infrapatellar fat pad was removed.  Suprapatellar synovium was removed to reveal the anterior distal femoral cortex.  A medial peel was performed to release the capsule of the medial tibial plateau.  The patella was then everted and was prepared and sized to a 32 mm.  A cover was placed on the patella for protection from retractors.  The knee was then brought into full flexion and we then turned our attention to the femur.  The cruciates were sacrificed.  Start site was drilled in the femur and the intramedullary distal femoral cutting guide was placed, set at 5 degrees valgus, taking 10 mm of distal resection. The distal cut was made. Osteophytes were then removed.  Next, the proximal tibial cutting guide was placed with appropriate slope, varus/valgus alignment and depth of resection. The proximal tibial cut was made. Gap blocks were then used to assess the extension gap and alignment, and appropriate soft tissue releases were performed. Attention was turned back to the femur, which was sized using the sizing guide to a size 7. Appropriate rotation of the femoral component was determined using epicondylar axis, Whiteside's line, and assessing the flexion gap under ligament tension. The appropriate size 4-in-1 cutting block was placed and checked with an angel wing and cuts were made. Posterior femoral osteophytes and uncapped bone were then removed with the curved osteotome.  Trial components were placed, and stability was checked in full extension, mid-flexion, and deep flexion. Proper tibial rotation was determined and marked.  The patella tracked well without a lateral release.  The femoral lugs were then drilled. Trial components were then removed and tibial preparation performed.  The tibia was sized for a size E component.  The bony surfaces were irrigated with a pulse lavage and then dried. Bone cement was vacuum mixed on the back table, and the  final components sized above were cemented into place.  Antibiotic irrigation was placed in the knee joint and soft tissues while the cement cured.  After cement had finished curing, excess cement was removed. The stability of the construct was re-evaluated throughout a range of motion and found to be acceptable. The trial liner was removed, the knee was copiously irrigated, and the knee was re-evaluated for any excess bone debris. The real polyethylene liner, 10 mm thick, was inserted and checked to ensure the locking mechanism had engaged appropriately. The tourniquet was deflated and hemostasis was achieved. The wound was irrigated with normal saline.  One gram of vancomycin powder was placed in the surgical bed.  Capsular closure was performed with a #1 vicryl, subcutaneous fat closed with a 0 vicryl suture, then subcutaneous tissue closed with interrupted 2.0 vicryl suture. The skin was then closed with a 2.0 nylon and dermabond. A sterile dressing was applied.  The patient was awakened in the operating room and taken to recovery in stable condition. All sponge, needle, and instrument counts were correct at the end of the case.  [name] was necessary for opening, closing, retracting, limb positioning and overall facilitation and completion of the surgery.  Position: supine  Complications: none.  Time Out: performed   Drains/Packing: none  Estimated blood loss: minimal  Returned to Recovery Room: in good condition.   Antibiotics: yes   Mechanical VTE (DVT) Prophylaxis: sequential compression devices, TED thigh-high  Chemical VTE (DVT) Prophylaxis: aspirin  Fluid Replacement  Crystalloid: see anesthesia record Blood: none  FFP: none   Specimens Removed: 1 to pathology   Sponge and Instrument Count Correct? yes   PACU: portable radiograph - knee AP and Lateral   Plan/RTC: Return in 2 weeks for wound check.   Weight Bearing/Load Lower Extremity: full   Implant Name Type  Inv. Item Serial No. Manufacturer Lot No. LRB No. Used Action  COMP FEM CMT PERSONA SZ7 RT - [id number] Joint COMP FEM CMT PERSONA SZ7 RT  ZIMMER [employer]ON(ORTH,TRAU,BIO,SG) [id number] Right 1 Implanted  STEM POLY PAT PLY 61M KNEE - [id number] Knees STEM POLY PAT PLY 61M KNEE  ZIMMER [employer]ON(ORTH,TRAU,BIO,SG) [id number] Right 1 Implanted  TIBIA STEM 5 DEG SZ E R KNEE - [id number] Knees TIBIA STEM 5 DEG SZ E R KNEE  ZIMMER [employer]ON(ORTH,TRAU,BIO,SG) [id number] Right 1 Implanted  INSERT TIB ASF SZ 6-7/EF 10 RT - [id number] Insert INSERT TIB ASF SZ 6-7/EF 10 RT  ZIMMER [employer]ON(ORTH,TRAU,BIO,SG) [id number] Right 1 Implanted  CEMENT BONE REFOBACIN R1X40 [place] - [id number] Cement CEMENT BONE REFOBACIN R1X40 [place]  ZIMMER [employer]ON(ORTH,TRAU,BIO,SG) [id number] Right 2 Implanted    N. Eduard Roux, MD [facility] 9:25 AM

## 2021-06-26 NOTE — Anesthesia Procedure Notes (Signed)
Anesthesia Regional Block: Adductor canal block   Pre-Anesthetic Checklist: , timeout performed,  Correct Patient, Correct Site, Correct Laterality,  Correct Procedure, Correct Position, site marked,  Risks and benefits discussed,  Surgical consent,  Pre-op evaluation,  At surgeon's request and post-op pain management  Laterality: Right  Prep: chloraprep       Needles:  Injection technique: Single-shot  Needle Type: Echogenic Stimulator Needle     Needle Length: 9cm  Needle Gauge: 21   Needle insertion depth: 7 cm   Additional Needles:   Procedures:,,,, ultrasound used (permanent image in chart),,    Narrative:  Start time: 06/26/2021 7:30 AM End time: 06/26/2021 7:35 AM Injection made incrementally with aspirations every 5 mL.  Performed by: Personally  Anesthesiologist: Josephine Igo, MD  Additional Notes: Timeout performed. Patient sedated. Relevant anatomy ID'd using [place]. Incremental 2-78ml injection of LA with frequent aspiration. Patient tolerated procedure well.    Right Adductor Canal Block

## 2021-06-26 NOTE — Anesthesia Procedure Notes (Signed)
Spinal  Patient location during procedure: OR Start time: 06/26/2021 7:53 AM End time: 06/26/2021 7:58 AM Reason for block: surgical anesthesia Preanesthetic Checklist Completed: patient identified, IV checked, site marked, risks and benefits discussed, surgical consent, monitors and equipment checked, pre-op evaluation and timeout performed Spinal Block Patient position: sitting Prep: DuraPrep and site prepped and draped Patient monitoring: heart rate, cardiac monitor, continuous pulse ox and blood pressure Approach: midline Location: L3-4 Injection technique: single-shot Needle Needle type: Pencan  Needle gauge: 24 G Needle length: 9 cm Needle insertion depth: 7 cm Assessment Sensory level: T4 Events: CSF return Additional Notes Patient tolerated procedure well. Adequate sensory level. Attempt x 2. Difficult  due to previous back surgery.

## 2021-06-26 NOTE — Discharge Instructions (Signed)

## 2021-06-26 NOTE — Anesthesia Postprocedure Evaluation (Signed)
Anesthesia Post Note  Patient: Kristin Gilmore  Procedure(s) Performed: RIGHT TOTAL KNEE ARTHROPLASTY (Right: Knee)     Patient location during evaluation: PACU Anesthesia Type: Spinal Level of consciousness: oriented and awake and alert Pain management: pain level controlled Vital Signs Assessment: post-procedure vital signs reviewed and stable Respiratory status: spontaneous breathing, respiratory function stable and nonlabored ventilation Cardiovascular status: blood pressure returned to baseline and stable Postop Assessment: no headache, no backache, no apparent nausea or vomiting, spinal receding and patient able to bend at knees Anesthetic complications: no   No notable events documented.  Last Vitals:  Vitals:   06/26/21 1055 06/26/21 1125  BP: 128/60 124/66  Pulse: (!) 55 (!) 55  Resp: 19 13  Temp: 36.7 C   SpO2: 100% 97%    Last Pain:  Vitals:   06/26/21 1040  TempSrc:   PainSc: 0-No pain                 Abdulkadir Emmanuel A.

## 2021-06-26 NOTE — H&P (Signed)
PREOPERATIVE H&P  Chief Complaint: right degernerative joint disease  HPI: Kristin Gilmore is a 70 y.o. female who presents for surgical treatment of right degernerative joint disease.  She denies any changes in medical history.  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   Pre-diabetes    "i was told i was pre-diabetic a while ago"   Tremor 04/13/2014   Past Surgical History:  Procedure Laterality Date   CHOLECYSTECTOMY     COLONOSCOPY W/ POLYPECTOMY     LUMBAR LAMINECTOMY  07/2020   3 level decompression - [place], [place]   LUMBAR LAMINECTOMY/DECOMPRESSION MICRODISCECTOMY N/A 12/19/2017   Procedure: LAMINECTOMY LUMBAR TWO- LUMBAR THREE, LUMBAR THREE- LUMBAR FOUR, LUMBAR FOUR- LUMBAR FIVE ;  Surgeon: Consuella Lose, MD;  Location: [facility];  Service: Neurosurgery;  Laterality: N/A;   NASAL SINUS SURGERY     TONSILLECTOMY     WISDOM TOOTH EXTRACTION     Social History   Socioeconomic History   Marital status: Married    Spouse name: Scientist, physiological   Number of children: 1   Years of education: college   Highest education level: Not on file  Occupational History   Occupation: Retired  Tobacco Use   Smoking status: Never   Smokeless tobacco: Never  Vaping Use   Vaping Use: Never used  Substance and Sexual Activity   Alcohol use: Yes    Comment: occasional glass of wine   Drug use: No   Sexual activity: Not on file  Other Topics Concern   Not on file  Social History Narrative   Lives at home, married   Patient does not drink caffeine.   Patient is right handed.   Social Determinants of Health   Financial Resource Strain: Not on file  Food Insecurity: Not on file  Transportation Needs: Not on file  Physical  Activity: Not on file  Stress: Not on file  Social Connections: Not on file   Family History  Problem Relation Age of Onset   Cancer Father        stomach cancer   Migraines Mother    Allergies  Allergen Reactions   Levofloxacin     Other reaction(s): Dizziness (intolerance)   Amoxicillin Hives    UNSPECIFIED REACTION  Has patient had a PCN reaction causing immediate rash, facial/tongue/throat swelling, SOB or lightheadedness with hypotension: Unknown Has patient had a PCN reaction causing severe rash involving mucus membranes or skin necrosis: Unknown Has patient had a PCN reaction that required hospitalization: Unknown Has patient had a PCN reaction occurring within the last 10 years: No If all of the above answers are "NO", then may proceed with Cephalosporin use.    Clarithromycin Hives, Other (See Comments) and Rash   Atropine Hives   Prior to Admission medications   Medication Sig Start Date End Date Taking? Authorizing Provider  acetaminophen (TYLENOL) 500 MG tablet Take 1,000 mg by mouth every 8 (eight) hours as needed for moderate pain.   Yes [provider]  amitriptyline (ELAVIL) 10 MG tablet Take 3 tablets (30 mg total) by mouth at bedtime. 05/29/21  Yes Kathrynn Ducking, MD  amLODipine (NORVASC) 5 MG tablet Take 5 mg by mouth daily. 07/27/18  Yes  [provider]  Calcium Citrate (CAL-CITRATE PO) Take 1 tablet by mouth daily. TAKE 1 TABLET IN THE MORNING & TAKE 2 TABLETS WITH SUPPER   Yes [provider]  carvedilol (COREG) 25 MG tablet Take 12.5 mg by mouth 2 (two) times daily with a meal. 07/14/19  Yes [provider]  Cholecalciferol (VITAMIN D3) 2000 UNITS TABS Take 2,000 Units by mouth daily with supper.    Yes [provider]  Coenzyme Q10 (COQ10) 100 MG CAPS Take 100 mg by mouth daily with supper.   Yes [provider]  dicyclomine (BENTYL) 20 MG tablet Take 20 mg by mouth every 4 (four) hours as needed for  spasms.   Yes [provider]  docusate sodium (COLACE) 100 MG capsule Take 300 mg by mouth at bedtime.   Yes [provider]  Erenumab-aooe (AIMOVIG) 140 MG/ML SOAJ Inject 140 mg into the skin every 30 (thirty) days. 05/29/21  Yes Kathrynn Ducking, MD  ferrous sulfate 325 (65 FE) MG EC tablet Take 1 tablet by mouth daily. 10/05/19  Yes [provider]  gabapentin (NEURONTIN) 300 MG capsule Take 300 mg by mouth 3 (three) times daily as needed (nerve pain).   Yes [provider]  glimepiride (AMARYL) 2 MG tablet Take 1 mg by mouth every morning. 10/19/19  Yes [provider]  Glucosamine-Chondroitin (COSAMIN DS PO) Take 1 capsule by mouth daily. 1500 glucosamine   Yes [provider]  hydrochlorothiazide (HYDRODIURIL) 25 MG tablet Take 25 mg by mouth daily. 04/10/19  Yes [provider]  MAGNESIUM SULFATE PO Take 1 tablet by mouth at bedtime.    Yes [provider]  mupirocin ointment (BACTROBAN) 2 % Apply 1 application topically 3 (three) times daily. 06/16/21  Yes [provider]  nabumetone (RELAFEN) 750 MG tablet TAKE 1 TABLET (750 MG TOTAL) BY MOUTH 2 (TWO) TIMES DAILY AS NEEDED. Patient taking differently: Take 750 mg by mouth 2 (two) times daily as needed for moderate pain. 04/27/20  Yes Hilts, Legrand Como, MD  nystatin cream (MYCOSTATIN) Apply 1 application topically daily as needed for dry skin. 08/17/19  Yes [provider]  Omega 3 1000 MG CAPS Take 1,000 mg by mouth daily.   Yes [provider]  OVER THE COUNTER MEDICATION Take 1 tablet by mouth 2 (two) times daily. Butter[place] root extract   Yes [provider]  pantoprazole (PROTONIX) 40 MG tablet Take 40 mg by mouth daily.   Yes [provider]  Probiotic Product (PROBIOTIC PO) Take 1 capsule by mouth daily. SUPREMA DOPHILUS 5 BILLION CFU   Yes [provider]  propranolol (INDERAL) 10 MG tablet Take 10 mg by mouth 2 (two)  times daily.   Yes [provider]  QUEtiapine (SEROQUEL) 300 MG tablet TAKE 0.5 TABLETS (150 MG TOTAL) BY MOUTH AT BEDTIME. Patient taking differently: Take 150 mg by mouth at bedtime. 06/07/21  Yes Cottle, Billey Co., MD  Red Yeast Rice 600 MG CAPS Take 1,200 mg by mouth 2 (two) times daily.   Yes [provider]  ROYAL JELLY PO Take 150 mg by mouth daily.   Yes [provider]  telmisartan (MICARDIS) 80 MG tablet Take 80 mg by mouth daily.   Yes [provider]  tiZANidine (ZANAFLEX) 2 MG tablet Take 1 tablet (2 mg total) by mouth 3 (three) times daily. Patient taking differently: Take 2 mg by mouth every 8 (eight) hours as needed for muscle spasms. 05/07/21  Yes Kathrynn Ducking, MD  tolterodine (DETROL LA) 4 MG 24 hr capsule Take 4 mg by mouth daily. 02/20/21  Yes [provider]  traMADol-acetaminophen (ULTRACET) 37.5-325 MG tablet Take 1 tablet by mouth every 6 (six) hours as needed for moderate pain.   Yes [provider]  trihexyphenidyl (ARTANE) 2 MG tablet TAKE 1 TABLET (2 MG TOTAL) BY MOUTH 3 (THREE) TIMES DAILY WITH MEALS. Patient taking differently: Take 2 mg by mouth 3 (three) times daily with meals. 06/05/21  Yes Kathrynn Ducking, MD  TURMERIC PO Take 475 mg by mouth daily. WITH FOOD   Yes [provider]  Venlafaxine HCl 37.5 MG TB24 Take 1 tablet by mouth daily. 06/05/21  Yes [provider]  vitamin E 400 UNIT capsule Take 400 Units by mouth daily with supper.    Yes [provider]  ALPRAZolam Duanne Moron) 1 MG tablet Take 1 tablet (1 mg total) by mouth at bedtime as needed for anxiety. Patient not taking: No sig reported 05/03/21   Kathrynn Ducking, MD  baclofen (LIORESAL) 10 MG tablet TAKE 0.5-1 TABLETS (5-10 MG TOTAL) BY MOUTH 3 (THREE) TIMES DAILY AS NEEDED FOR MUSCLE SPASMS. Patient not taking: No sig reported 05/18/20   Hilts, Legrand Como, MD  diclofenac (CATAFLAM) 50 MG tablet Take 1 tablet (50 mg total) by  mouth 3 (three) times daily. Patient not taking: No sig reported 05/07/21   Kathrynn Ducking, MD  methylPREDNISolone (MEDROL) 4 MG tablet Take 1 tablet (4 mg total) by mouth daily. Patient not taking: No sig reported 08/21/20   Jessy Oto, MD  propranolol (INDERAL) 20 MG tablet TAKE 1 TABLET TWICE DAILY X 2 WEEKS, THEN TAKE 2 TABLETS TWICE DAILY Patient not taking: No sig reported 03/20/21   Suzzanne Cloud, NP  SUMAtriptan (IMITREX) 100 MG tablet Take 1 tablet (100 mg total) by mouth 2 (two) times daily as needed for up to 28 days for migraine. May repeat in 2 hours if headache persists or recurs. 06/25/21 07/23/21  Kathrynn Ducking, MD     Positive ROS: All other systems have been reviewed and were otherwise negative with the exception of those mentioned in the HPI and as above.  Physical Exam: General: Alert, no acute distress Cardiovascular: No pedal edema Respiratory: No cyanosis, no use of accessory musculature GI: abdomen soft Skin: No lesions in the area of chief complaint Neurologic: Sensation intact distally Psychiatric: Patient is competent for consent with normal mood and affect Lymphatic: no lymphedema  MUSCULOSKELETAL: exam stable  Assessment: right degernerative joint disease  Plan: Plan for Procedure(s): RIGHT TOTAL KNEE ARTHROPLASTY  The risks benefits and alternatives were discussed with the patient including but not limited to the risks of nonoperative treatment, versus surgical intervention including infection, bleeding, nerve injury,  blood clots, cardiopulmonary complications, morbidity, mortality, among others, and they were willing to proceed.   Preoperative templating of the joint replacement has been completed, documented, and submitted to the Operating Room personnel in order to optimize intra-operative equipment management.   Eduard Roux, MD 06/26/2021 6:19 AM

## 2021-06-27 ENCOUNTER — Encounter (HOSPITAL_COMMUNITY): Payer: Self-pay | Admitting: Orthopaedic Surgery

## 2021-06-27 DIAGNOSIS — M1711 Unilateral primary osteoarthritis, right knee: Secondary | ICD-10-CM | POA: Diagnosis not present

## 2021-06-27 LAB — CBC
HCT: 31.7 % — ABNORMAL LOW (ref 36.0–46.0)
Hemoglobin: 10.3 g/dL — ABNORMAL LOW (ref 12.0–15.0)
MCH: 31.4 pg (ref 26.0–34.0)
MCHC: 32.5 g/dL (ref 30.0–36.0)
MCV: 96.6 fL (ref 80.0–100.0)
Platelets: 286 10*3/uL (ref 150–400)
RBC: 3.28 MIL/uL — ABNORMAL LOW (ref 3.87–5.11)
RDW: 13.1 % (ref 11.5–15.5)
WBC: 10.3 10*3/uL (ref 4.0–10.5)
nRBC: 0 % (ref 0.0–0.2)

## 2021-06-27 LAB — BASIC METABOLIC PANEL
Anion gap: 7 (ref 5–15)
BUN: 16 mg/dL (ref 8–23)
CO2: 28 mmol/L (ref 22–32)
Calcium: 9.1 mg/dL (ref 8.9–10.3)
Chloride: 96 mmol/L — ABNORMAL LOW (ref 98–111)
Creatinine, Ser: 1.14 mg/dL — ABNORMAL HIGH (ref 0.44–1.00)
GFR, Estimated: 51 mL/min — ABNORMAL LOW (ref >60)
Glucose, Bld: 169 mg/dL — ABNORMAL HIGH (ref 70–99)
Potassium: 5.1 mmol/L (ref 3.5–5.1)
Sodium: 131 mmol/L — ABNORMAL LOW (ref 135–145)

## 2021-06-27 MED ORDER — ALUM & MAG HYDROXIDE-SIMETH 200-200-20 MG/5ML PO SUSP: 30.0000 mL | Freq: Four times a day (QID) | ORAL | Status: DC | PRN

## 2021-06-27 NOTE — Evaluation (Signed)
Occupational Therapy Evaluation Patient Details Name: Kristin Gilmore MRN: [id number] DOB: 18-May-1951 Today's Date: 06/27/2021   History of Present Illness Pt is a 70 y/o female s/p R TKA on 9/27. PMH includes HTN, Parkinson's disease, migraine and lumbar surgery.   Clinical Impression   Raghad was mod I prior to the above R TKA. She lives in a 1 level home with 2 STE, with her husband who also has parkinson's with frequent falls, how pt states he can assist with LB dressing. Pt was received in the bathroom and completed toileting and grooming tasks with supervision A. She was impulsive throughout the session, but safe otherwise with good use of RW and positioning. Pt requires up to min A for ADLs. She does not need OT acutely. Recommend pt follow the surgeons d/c plan; she would benefit from [facility].    Recommendations for follow up therapy are one component of a multi-disciplinary discharge planning process, led by the attending physician.  Recommendations may be updated based on patient status, additional functional criteria and insurance authorization.   Follow Up Recommendations  Follow surgeon's recommendation for DC plan and follow-up therapies;Supervision/Assistance - 24 hour    Equipment Recommendations  None recommended by OT       Precautions / Restrictions Precautions Precautions: Knee Precaution Booklet Issued: No Precaution Comments: Verbally reviewed knee precautions Restrictions Weight Bearing Restrictions: Yes RLE Weight Bearing: Weight bearing as tolerated      Mobility Bed Mobility Overal bed mobility: Needs Assistance             General bed mobility comments: Pt OOB upon arrival    Transfers Overall transfer level: Needs assistance Equipment used: Rolling walker (2 wheeled) Transfers: Sit to/from Stand Sit to Stand: Supervision         General transfer comment: for safety    Balance Overall balance assessment: Needs  assistance Sitting-balance support: Feet supported;No upper extremity supported Sitting balance-Leahy Scale: Fair Sitting balance - Comments: pt with posterior LOB while sitting on edge of chair during LB dressing   Standing balance support: During functional activity;Single extremity supported Standing balance-Leahy Scale: Fair Standing balance comment: pt able to complete BUE tasks at the sink while statically standing           ADL either performed or assessed with clinical judgement   ADL Overall ADL's : Needs assistance/impaired Eating/Feeding: Independent;Sitting   Grooming: Supervision/safety;Standing   Upper Body Bathing: Set up;Sitting   Lower Body Bathing: Supervison/ safety;Sit to/from stand   Upper Body Dressing : Set up;Sitting   Lower Body Dressing: Minimal assistance;Sit to/from stand Lower Body Dressing Details (indicate cue type and reason): min A for sock/shoe on RLE Toilet Transfer: Supervision/safety;RW;Ambulation;Regular Museum/gallery exhibitions officer and Hygiene: Sit to/from stand       Functional mobility during ADLs: Supervision/safety;Rolling walker General ADL Comments: limited be ROM of RLE, back pain and baseline tremors     Vision Patient Visual Report: No change from baseline Vision Assessment?: No apparent visual deficits            Pertinent Vitals/Pain Pain Assessment: Faces Pain Score: 8  Faces Pain Scale: Hurts a little bit Pain Location: back>R knee Pain Descriptors / Indicators: Discomfort Pain Intervention(s): Monitored during session;Limited activity within patient's tolerance     Hand Dominance     Extremity/Trunk Assessment Upper Extremity Assessment Upper Extremity Assessment: Overall WFL for tasks assessed   Lower Extremity Assessment Lower Extremity Assessment: Defer to PT evaluation   Cervical /  Trunk Assessment Cervical / Trunk Assessment: Kyphotic   Communication Communication Communication: No  difficulties   Cognition Arousal/Alertness: Awake/alert Behavior During Therapy: WFL for tasks assessed/performed;Impulsive Overall Cognitive Status: Within Functional Limits for tasks assessed           General Comments: Pt impulsive with ambulation, but otherwise safe wtih good hand positioning and proper use of RW.   General Comments  VSS on RA, no family present    Exercises Exercises: Total Joint Total Joint Exercises Goniometric ROM: knee flexion AROM 90, PROM 95, knee extension AROM -10, PROM -7        Home Living Family/patient expects to be discharged to:: Private residence Living Arrangements: Spouse/significant other Available Help at Discharge: Family;Available 24 hours/day;Neighbor Type of Home: House Home Access: Stairs to enter [employer] of Steps: 2 Entrance Stairs-Rails: Right;Left;Can reach both Home Layout: One level     Bathroom Shower/Tub: Occupational psychologist: Standard     Home Equipment: Grab bars - tub/shower;Grab bars - toilet;Walker - 2 wheels;Shower seat   Additional Comments: pt's husband has parkinsons andhas a lot of falls. Pt states he can assist her with socks/shoes; her neighbor "pam" will assist with IADLs as needed      Prior Functioning/Environment Level of Independence: Independent with assistive device(s)        Comments: Uses RW for ambulation, shower seat use for showers; neighbor assists with IADLs as needed        OT Problem List: Decreased strength;Decreased range of motion;Decreased activity tolerance;Impaired balance (sitting and/or standing);Decreased safety awareness;Decreased knowledge of use of DME or AE;Decreased knowledge of precautions;Pain      OT Treatment/Interventions: Self-care/ADL training;Therapeutic exercise;DME and/or AE instruction;Patient/family education;Balance training;Therapeutic activities    OT Goals(Current goals can be found in the care plan section) Acute Rehab OT  Goals Patient Stated Goal: home asap OT Goal Formulation: With patient Time For Goal Achievement: 06/27/21 Potential to Achieve Goals: Fair      AM-PAC OT "6 Clicks" Daily Activity     Outcome Measure Help from another person eating meals?: None Help from another person taking care of personal grooming?: A Little Help from another person toileting, which includes using toliet, bedpan, or urinal?: A Little Help from another person bathing (including washing, rinsing, drying)?: A Little Help from another person to put on and taking off regular upper body clothing?: None Help from another person to put on and taking off regular lower body clothing?: A Little 6 Click Score: 20   End of Session Equipment Utilized During Treatment: Rolling walker Nurse Communication: Mobility status  Activity Tolerance: Patient tolerated treatment well Patient left: in chair;with call bell/phone within reach (direct hand off to PT)  OT Visit Diagnosis: Unsteadiness on feet (R26.81);Other abnormalities of gait and mobility (R26.89);History of falling (Z91.81);Muscle weakness (generalized) (M62.81);Pain                Time: 6754-4920 OT Time Calculation (min): 11 min Charges:  OT General Charges $OT Visit: 1 Visit OT Evaluation $OT Eval Low Complexity: 1 Low   Kiamesha Samet A Alekzander Cardell 06/27/2021, 9:18 AM

## 2021-06-27 NOTE — Progress Notes (Signed)
Physical Therapy Treatment Patient Details Name: Kristin Gilmore MRN: [id number] DOB: 1951-05-21 Today's Date: 06/27/2021   History of Present Illness Pt is a 70 y/o female s/p R TKA on 9/27. PMH includes HTN, Parkinson's disease, migraine and lumbar surgery.    PT Comments    Pt tolerates treatment well, ambulating for short household distances without physical assistance. Pt demonstrates the ability to negotiate 2 steps with assistance for safety and verbal cues for technique. PT provides education on HEP and progressive ambulation at home. Pt will benefit from continued acute PT services to improve activity tolerance and reduce falls risk.  Recommendations for follow up therapy are one component of a multi-disciplinary discharge planning process, led by the attending physician.  Recommendations may be updated based on patient status, additional functional criteria and insurance authorization.  Follow Up Recommendations  Follow surgeon's recommendation for DC plan and follow-up therapies;Supervision for mobility/OOB     Equipment Recommendations  None recommended by PT (pt owns necessary DME)    Recommendations for Other Services       Precautions / Restrictions Precautions Precautions: Knee Precaution Booklet Issued: No Precaution Comments: Verbally reviewed knee precautions Restrictions Weight Bearing Restrictions: Yes RLE Weight Bearing: Weight bearing as tolerated     Mobility  Bed Mobility               General bed mobility comments: pt received and left in recliner    Transfers Overall transfer level: Needs assistance Equipment used: Rolling walker (2 wheeled) Transfers: Sit to/from Stand Sit to Stand: Supervision            Ambulation/Gait Ambulation/Gait assistance: Supervision Gait Distance (Feet): 60 Feet Assistive device: Rolling walker (2 wheeled) Gait Pattern/deviations: Step-through pattern Gait velocity: reduced Gait velocity  interpretation: 1.31 - 2.62 ft/sec, indicative of limited community ambulator General Gait Details: pt with slowed step-through gait, mild increase in trunk flexion   Stairs Stairs: Yes Stairs assistance: Min guard Stair Management: Two rails;Step to pattern;Forwards Number of Stairs: 2     Wheelchair Mobility    Modified Rankin (Stroke Patients Only)       Balance Overall balance assessment: Needs assistance Sitting-balance support: No upper extremity supported;Feet supported Sitting balance-Leahy Scale: Good     Standing balance support: Bilateral upper extremity supported Standing balance-Leahy Scale: Poor Standing balance comment: benefits from UE support of walker                            Cognition Arousal/Alertness: Awake/alert Behavior During Therapy: WFL for tasks assessed/performed Overall Cognitive Status: Within Functional Limits for tasks assessed                                        Exercises Total Joint Exercises Goniometric ROM: knee flexion AROM 90, PROM 95, knee extension AROM -10, PROM -7    General Comments General comments (skin integrity, edema, etc.): VSS on RA, dressing clean and intact      Pertinent Vitals/Pain Pain Assessment: 0-10 Pain Score: 8  Pain Location: R knee Pain Descriptors / Indicators: Aching Pain Intervention(s): Monitored during session    Home Living                      Prior Function            PT Goals (current goals can now  be found in the care plan section) Acute Rehab PT Goals Patient Stated Goal: to return to independent mobility Progress towards PT goals: Progressing toward goals    Frequency    7X/week      PT Plan Current plan remains appropriate    Co-evaluation              AM-PAC PT "6 Clicks" Mobility   Outcome Measure  Help needed turning from your back to your side while in a flat bed without using bedrails?: A Little Help needed moving  from lying on your back to sitting on the side of a flat bed without using bedrails?: A Little Help needed moving to and from a bed to a chair (including a wheelchair)?: A Little Help needed standing up from a chair using your arms (e.g., wheelchair or bedside chair)?: A Little Help needed to walk in hospital room?: A Little Help needed climbing 3-5 steps with a railing? : A Little 6 Click Score: 18    End of Session   Activity Tolerance: Patient tolerated treatment well Patient left: in chair;with call bell/phone within reach Nurse Communication: Mobility status PT Visit Diagnosis: Unsteadiness on feet (R26.81);Muscle weakness (generalized) (M62.81);Difficulty in walking, not elsewhere classified (R26.2);Pain Pain - Right/Left: Right Pain - part of body: Knee     Time: 3612-2449 PT Time Calculation (min) (ACUTE ONLY): 20 min  Charges:  $Gait Training: 8-22 mins                     Zenaida Niece, PT, DPT Acute Rehabilitation Pager: [phone]    Zenaida Niece 06/27/2021, 9:07 AM

## 2021-06-27 NOTE — Discharge Summary (Signed)
Patient ID: Kristin Gilmore MRN: [id number] DOB/AGE: [birth year] 70 y.o.  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 female who was admitted 06/26/2021 for operative treatment ofPrimary osteoarthritis of right knee. Patient has severe unremitting pain that affects sleep, daily activities, and work/hobbies. After pre-op clearance the patient was taken to the operating room on 06/26/2021 and underwent  Procedure(s): RIGHT TOTAL KNEE ARTHROPLASTY.    Patient was given perioperative antibiotics:  Anti-infectives (From admission, onward)    Start     Dose/Rate Route Frequency Ordered Stop   06/26/21 1600  ceFAZolin (ANCEF) IVPB 2g/100 mL premix        2 g 200 mL/hr over 30 Minutes Intravenous Every 6 hours 06/26/21 1229 06/26/21 2139   06/26/21 0725  vancomycin (VANCOCIN) powder  Status:  Discontinued          As needed 06/26/21 0725 06/26/21 1000   06/26/21 0600  clindamycin (CLEOCIN) IVPB 900 mg        900 mg 100 mL/hr over 30 Minutes Intravenous On call to O.R. 06/26/21 0540 06/26/21 0800        Patient was given sequential compression devices, early ambulation, and chemoprophylaxis to prevent DVT.  Patient benefited maximally from hospital  stay and there were no complications.    Recent vital signs: Patient Vitals for the past 24 hrs:  BP Temp Temp src Pulse Resp SpO2  06/27/21 0103 (!) 142/106 98.2 F (36.8 C) Oral 82 18 98 %  06/26/21 2139 (!) 143/104 98.7 F (37.1 C) Oral 77 18 100 %  06/26/21 1547 116/85 97.6 F (36.4 C) -- 71 16 97 %  06/26/21 1224 137/82 97.6 F (36.4 C) Oral (!) 57 18 (!) 78 %  06/26/21 1140 (!) 128/95 -- -- (!) 56 17 97 %  06/26/21 1125 124/66 -- -- (!) 55 13 97 %  06/26/21 1055 128/60 98 F (36.7 C) -- (!) 55 19 100 %  06/26/21 1040 126/65 -- -- (!) 58 17 100 %  06/26/21 1025 114/84 -- -- 65 16 97 %  06/26/21 1010 (!) 98/51 97.6 F (36.4 C) -- (!) 59 13 97 %     Recent laboratory studies: No results for input(s): WBC, HGB, HCT, PLT, NA, K, CL, CO2, BUN, CREATININE, GLUCOSE, INR, CALCIUM in the last 72 hours.  Invalid input(s): PT, 2   Discharge Medications:   Allergies as of 06/27/2021       Reactions   Levofloxacin    Other reaction(s): Dizziness (intolerance)   Amoxicillin Hives   UNSPECIFIED REACTION  Has patient had a PCN reaction causing immediate rash, facial/tongue/throat swelling, SOB or lightheadedness with hypotension: Unknown Has patient had a PCN reaction causing severe rash involving mucus membranes or skin necrosis:  Unknown Has patient had a PCN reaction that required hospitalization: Unknown Has patient had a PCN reaction occurring within the last 10 years: No If all of the above answers are "NO", then may proceed with Cephalosporin use.   Clarithromycin Hives, Other (See Comments), Rash   Atropine Hives        Medication List     STOP taking these medications    acetaminophen 500 MG tablet Commonly known as: TYLENOL   ALPRAZolam 1 MG tablet Commonly known as: Xanax   baclofen 10 MG tablet Commonly known as: LIORESAL   COSAMIN DS PO   diclofenac 50 MG tablet Commonly known as: CATAFLAM   methylPREDNISolone 4 MG tablet Commonly known as: Medrol    Omega 3 1000 MG Caps   tiZANidine 2 MG tablet Commonly known as: ZANAFLEX   traMADol-acetaminophen 37.5-325 MG tablet Commonly known as: ULTRACET   TURMERIC PO       TAKE these medications    Aimovig 140 MG/ML [place] Generic drug: Erenumab-aooe Inject 140 mg into the skin every 30 (thirty) days.   amitriptyline 10 MG tablet Commonly known as: ELAVIL Take 3 tablets (30 mg total) by mouth at bedtime.   amLODipine 5 MG tablet Commonly known as: NORVASC Take 5 mg by mouth daily.   aspirin EC 81 MG tablet Take 1 tablet (81 mg total) by mouth 2 (two) times daily.   CAL-CITRATE PO Take 1 tablet by mouth daily. TAKE 1 TABLET IN THE MORNING & TAKE 2 TABLETS WITH SUPPER   carvedilol 25 MG tablet Commonly known as: COREG Take 12.5 mg by mouth 2 (two) times daily with a meal.   CoQ10 100 MG Caps Take 100 mg by mouth daily with supper.   dicyclomine 20 MG tablet Commonly known as: BENTYL Take 20 mg by mouth every 4 (four) hours as needed for spasms.   docusate sodium 100 MG capsule Commonly known as: COLACE Take 300 mg by mouth at bedtime.   ferrous sulfate 325 (65 FE) MG EC tablet Take 1 tablet by mouth daily.   gabapentin 300 MG capsule Commonly known as: NEURONTIN Take 300 mg by mouth 3 (three) times daily as needed (nerve pain).   glimepiride 2 MG tablet Commonly known as: AMARYL Take 1 mg by mouth every morning.   hydrochlorothiazide 25 MG tablet Commonly known as: HYDRODIURIL Take 25 mg by mouth daily.   MAGNESIUM SULFATE PO Take 1 tablet by mouth at bedtime.   methocarbamol 500 MG tablet Commonly known as: Robaxin Take 1 tablet (500 mg total) by mouth 2 (two) times daily as needed.   mupirocin ointment 2 % Commonly known as: BACTROBAN Apply 1 application topically 3 (three) times daily.   nabumetone 750 MG tablet Commonly known as: RELAFEN TAKE 1 TABLET (750 MG TOTAL) BY MOUTH 2 (TWO) TIMES DAILY AS NEEDED. What changed: reasons to take this    nystatin cream Commonly known as: MYCOSTATIN Apply 1 application topically daily as needed for dry skin.   ondansetron 4 MG tablet Commonly known as: Zofran Take 1 tablet (4 mg total) by mouth every 8 (eight) hours as needed for nausea or vomiting.   OVER THE COUNTER MEDICATION Take 1 tablet by mouth 2 (two) times daily. Butterbur root extract   oxyCODONE-acetaminophen 5-325 MG tablet Commonly known as: Percocet Take 1-2 tablets by mouth every 6 (six) hours as needed.   pantoprazole 40 MG tablet Commonly known as: PROTONIX Take 40 mg by mouth daily.   PROBIOTIC PO  Take 1 capsule by mouth daily. SUPREMA DOPHILUS 5 BILLION CFU   propranolol 10 MG tablet Commonly known as: INDERAL Take 10 mg by mouth 2 (two) times daily. What changed: Another medication with the same name was removed. Continue taking this medication, and follow the directions you see here.   QUEtiapine 300 MG tablet Commonly known as: SEROQUEL TAKE 0.5 TABLETS (150 MG TOTAL) BY MOUTH AT BEDTIME. What changed: See the new instructions.   Red Yeast Rice 600 MG Caps Take 1,200 mg by mouth 2 (two) times daily.   ROYAL JELLY PO Take 150 mg by mouth daily.   SUMAtriptan 100 MG tablet Commonly known as: IMITREX Take 1 tablet (100 mg total) by mouth 2 (two) times daily as needed for up to 28 days for migraine. May repeat in 2 hours if headache persists or recurs.   telmisartan 80 MG tablet Commonly known as: MICARDIS Take 80 mg by mouth daily.   tolterodine 4 MG 24 hr capsule Commonly known as: DETROL LA Take 4 mg by mouth daily.   trihexyphenidyl 2 MG tablet Commonly known as: ARTANE TAKE 1 TABLET (2 MG TOTAL) BY MOUTH 3 (THREE) TIMES DAILY WITH MEALS. What changed: See the new instructions.   Venlafaxine HCl 37.5 MG Tb24 Take 1 tablet by mouth daily.   Vitamin D3 50 MCG (2000 UT) Tabs Take 2,000 Units by mouth daily with supper.   vitamin E 180 MG (400 UNITS) capsule Take 400 Units by mouth  daily with supper.               Durable Medical Equipment  (From admission, onward)           Start     Ordered   06/26/21 1230  DME Walker rolling  Once       Question Answer Comment  Walker: With [place]   Patient needs a walker to treat with the following condition Status post left partial knee replacement      06/26/21 1229   06/26/21 1230  DME 3 n 1  Once        06/26/21 1229   06/26/21 1230  DME Bedside commode  Once       Question:  Patient needs a bedside commode to treat with the following condition  Answer:  Status post left partial knee replacement   06/26/21 1229            Diagnostic Studies: DG Knee Right Port  Result Date: 06/26/2021 CLINICAL DATA:  Right knee arthroplasty EXAM: PORTABLE RIGHT KNEE - 1-2 VIEW COMPARISON:  04/09/2021 FINDINGS: Interval postsurgical changes from right total knee arthroplasty. Arthroplasty components are in their expected alignment. No periprosthetic fracture or evidence of other complication. Expected postoperative changes within the overlying soft tissues. IMPRESSION: Interval postsurgical changes from right total knee arthroplasty. Electronically Signed   By: Davina Poke D.O.   On: 06/26/2021 11:07    Disposition: Discharge disposition: 01-Home or Self Care          Follow-up Information     Leandrew Koyanagi, MD. Schedule an appointment as soon as possible for a visit in 2 week(s).   Specialty: Orthopedic Surgery Contact information: [address] [place] [address] [phone]                  Signed: Aundra Dubin 06/27/2021, 7:58 AM

## 2021-06-27 NOTE — Care Management Obs Status (Signed)
[facility] NOTIFICATION   Patient Details  Name: Kristin Gilmore MRN: [id number] Date of Birth: 07/09/1951   Medicare Observation Status Notification Given:  Yes    Angelita Ingles, RN 06/27/2021, 1:50 PM

## 2021-06-27 NOTE — Plan of Care (Signed)
Pt given D/C instructions with verbal understanding. Rx's were sent to the pharmacy by MD. Pt's incision is clean and dry with no sign of infection. Pt's IV was removed prior to D/C. Pt's Home Health was arranged prior to D/C. Pt D/C'd Home via wheelchair per MD order. Pt D/C'd home via wheelchair per MD order. Pt is stable @ D/C and has no other needs at this time. Holli Humbles, RN

## 2021-06-27 NOTE — Progress Notes (Signed)
Physical Therapy Treatment Patient Details Name: Kristin Gilmore MRN: [id number] DOB: 02-11-1951 Today's Date: 06/27/2021   History of Present Illness Pt is a 70 y/o female s/p R TKA on 9/27. PMH includes HTN, Parkinson's disease, migraine and lumbar surgery.    PT Comments    Pt tolerates treatment well with improved tolerance for activity. Pt continues to demonstrate the ability to ambulate, transfer, and negotiate stairs without physical assistance. Pt demonstrates good knowledge of RLE HEP. Pt will benefit from continued acute therapies to aide in a return to independence.  Recommendations for follow up therapy are one component of a multi-disciplinary discharge planning process, led by the attending physician.  Recommendations may be updated based on patient status, additional functional criteria and insurance authorization.  Follow Up Recommendations  Follow surgeon's recommendation for DC plan and follow-up therapies;Supervision for mobility/OOB     Equipment Recommendations  None recommended by PT (pt owns necessary DME)    Recommendations for Other Services       Precautions / Restrictions Precautions Precautions: Knee Precaution Booklet Issued: No Precaution Comments: Verbally reviewed knee precautions Restrictions Weight Bearing Restrictions: Yes RLE Weight Bearing: Weight bearing as tolerated     Mobility  Bed Mobility                    Transfers Overall transfer level: Needs assistance Equipment used: Rolling walker (2 wheeled) Transfers: Sit to/from Stand Sit to Stand: Modified independent (Device/Increase time)            Ambulation/Gait Ambulation/Gait assistance: Supervision Gait Distance (Feet): 70 Feet Assistive device: Rolling walker (2 wheeled) Gait Pattern/deviations: Step-through pattern Gait velocity: reduced Gait velocity interpretation: 1.31 - 2.62 ft/sec, indicative of limited community ambulator General Gait Details: pt  with slowed step-through gait   Stairs Stairs: Yes Stairs assistance: Min guard Stair Management: Two rails;Step to pattern Number of Stairs: 1 (one step for 3 repetitions)     Wheelchair Mobility    Modified Rankin (Stroke Patients Only)       Balance Overall balance assessment: Needs assistance Sitting-balance support: No upper extremity supported;Feet supported Sitting balance-Leahy Scale: Good     Standing balance support: No upper extremity supported Standing balance-Leahy Scale: Fair                              Cognition Arousal/Alertness: Awake/alert Behavior During Therapy: WFL for tasks assessed/performed;Impulsive Overall Cognitive Status: Within Functional Limits for tasks assessed                                        Exercises      General Comments General comments (skin integrity, edema, etc.): VSS on RA      Pertinent Vitals/Pain Pain Assessment: Faces Faces Pain Scale: Hurts a little bit Pain Location: R knee Pain Descriptors / Indicators: Grimacing Pain Intervention(s): Monitored during session    Home Living                      Prior Function            PT Goals (current goals can now be found in the care plan section) Acute Rehab PT Goals Patient Stated Goal: home asap Progress towards PT goals: Progressing toward goals    Frequency    7X/week      PT Plan  Current plan remains appropriate    Co-evaluation              AM-PAC PT "6 Clicks" Mobility   Outcome Measure  Help needed turning from your back to your side while in a flat bed without using bedrails?: A Little Help needed moving from lying on your back to sitting on the side of a flat bed without using bedrails?: A Little Help needed moving to and from a bed to a chair (including a wheelchair)?: None Help needed standing up from a chair using your arms (e.g., wheelchair or bedside chair)?: None Help needed to walk in  hospital room?: A Little Help needed climbing 3-5 steps with a railing? : A Little 6 Click Score: 20    End of Session   Activity Tolerance: Patient tolerated treatment well Patient left: in chair;with call bell/phone within reach Nurse Communication: Mobility status PT Visit Diagnosis: Unsteadiness on feet (R26.81);Muscle weakness (generalized) (M62.81);Difficulty in walking, not elsewhere classified (R26.2);Pain Pain - Right/Left: Right Pain - part of body: Knee     Time: 5525-8948 PT Time Calculation (min) (ACUTE ONLY): 21 min  Charges:  $Gait Training: 8-22 mins                     Zenaida Niece, PT, DPT Acute Rehabilitation Pager: [phone]    Zenaida Niece 06/27/2021, 2:40 PM

## 2021-06-27 NOTE — Progress Notes (Signed)
Subjective: 1 Day Post-Op Procedure(s) (LRB): RIGHT TOTAL KNEE ARTHROPLASTY (Right) Patient reports pain as moderate.    Objective: Vital signs in last 24 hours: Temp:  [97.6 F (36.4 C)-98.7 F (37.1 C)] 98.2 F (36.8 C) (09/28 0103) Pulse Rate:  [55-82] 82 (09/28 0103) Resp:  [13-19] 18 (09/28 0103) BP: (98-143)/(51-106) 142/106 (09/28 0103) SpO2:  [78 %-100 %] 98 % (09/28 0103)  Intake/Output from previous day: 09/27 0701 - 09/28 0700 In: 1050 [I.V.:900; IV Piggyback:150] Out: 100 [Blood:100] Intake/Output this shift: No intake/output data recorded.  No results for input(s): HGB in the last 72 hours. No results for input(s): WBC, RBC, HCT, PLT in the last 72 hours. No results for input(s): NA, K, CL, CO2, BUN, CREATININE, GLUCOSE, CALCIUM in the last 72 hours. No results for input(s): LABPT, INR in the last 72 hours.  Neurologically intact Neurovascular intact Sensation intact distally Intact pulses distally Dorsiflexion/Plantar flexion intact Incision: dressing C/D/I No cellulitis present Compartment soft   Assessment/Plan: 1 Day Post-Op Procedure(s) (LRB): RIGHT TOTAL KNEE ARTHROPLASTY (Right) Advance diet Up with therapy D/C IV fluids Discharge home with home health after second PT session as long as she is safe to go home WBAT RLE   Anticipated LOS equal to or greater than 2 midnights due to - Age 13 and older with one or more of the following:  - Obesity  - Expected need for hospital services (PT, OT, Nursing) required for safe  discharge  - Anticipated need for postoperative skilled nursing care or inpatient rehab  - Active co-morbidities: None OR   - Unanticipated findings during/Post Surgery: Slow post-op progression: GI, pain control, mobility  - Patient is a high risk of re-admission due to: None   Aundra Dubin 06/27/2021, 7:56 AM

## 2021-06-29 ENCOUNTER — Ambulatory Visit: Payer: Medicare Other | Admitting: Physical Therapy

## 2021-06-29 ENCOUNTER — Telehealth: Payer: Self-pay

## 2021-06-29 NOTE — Telephone Encounter (Signed)
Patient would a call back concerning the knee machine that was ordered for her.  Stated that she can not use it and that it needs to be picked up. Patient had right TKA on 06/26/2021.  CB# [phone].  Please advise.  Thank you

## 2021-06-29 NOTE — Telephone Encounter (Signed)
Mendel Ryder spoke to patient

## 2021-07-02 ENCOUNTER — Telehealth: Payer: Medicare Other | Admitting: Psychiatry

## 2021-07-03 ENCOUNTER — Telehealth: Payer: Self-pay

## 2021-07-03 ENCOUNTER — Telehealth: Payer: Self-pay | Admitting: Orthopaedic Surgery

## 2021-07-03 NOTE — Telephone Encounter (Signed)
Pt called asking for a refill of her percocet 5-325 mg, pt would like a CB when this has been sent in please.  [phone]

## 2021-07-03 NOTE — Telephone Encounter (Signed)
Ok, thanks.

## 2021-07-03 NOTE — Telephone Encounter (Signed)
Patient calling stating her knee/leg is in the most pain it has been in days She states she is elevating and icing She states she has swelling and pain in her lower leg and wants a call back from [employer]

## 2021-07-03 NOTE — Telephone Encounter (Signed)
FYI  S/P 06/26/21-RIGHT TKA.  [facility] called and states patient does not like therapist HHPT.  +Yelling/mad/cussing at their staff. Making it very difficult to come out to see her. They will send another therapist Saturday and try their best to see her.

## 2021-07-04 ENCOUNTER — Telehealth: Payer: Self-pay | Admitting: Orthopaedic Surgery

## 2021-07-04 ENCOUNTER — Other Ambulatory Visit: Payer: Self-pay | Admitting: Physician Assistant

## 2021-07-04 ENCOUNTER — Telehealth: Payer: Self-pay | Admitting: Physician Assistant

## 2021-07-04 MED ORDER — OXYCODONE-ACETAMINOPHEN 5-325 MG PO TABS: 1.0000 | ORAL_TABLET | Freq: Four times a day (QID) | ORAL | 0 refills | Status: DC | PRN

## 2021-07-04 NOTE — Telephone Encounter (Signed)
Pt called requesting a refill of nabumetone that Dr. Junius Roads used to prescribe. Pt is asking for a call back about refills. Please call pt at [phone].

## 2021-07-04 NOTE — Telephone Encounter (Signed)
1 week for the nonoperative leg and 4 weeks for the operative leg

## 2021-07-04 NOTE — Telephone Encounter (Signed)
Pt states she returning call to [facility]. Please call pt at [phone].

## 2021-07-04 NOTE — Telephone Encounter (Signed)
Left a message to call back.

## 2021-07-04 NOTE — Telephone Encounter (Signed)
Pt called back stating that she would like to speak with Dr. Erlinda Hong not a PA.

## 2021-07-04 NOTE — Telephone Encounter (Signed)
Sent in

## 2021-07-04 NOTE — Telephone Encounter (Signed)
Patient called needing to know when can she stop wearing the compression stockings? The number to contact patient is [phone]

## 2021-07-05 NOTE — Telephone Encounter (Signed)
Talked with patient and advised her of message below, per Dr. Erlinda Hong.  Patient voiced that she understands.

## 2021-07-05 NOTE — Telephone Encounter (Signed)
Talked with patient and advised her of message below per Mendel Ryder. Patient voiced that she understands.

## 2021-07-05 NOTE — Telephone Encounter (Signed)
April spoke to patient and advised.

## 2021-07-05 NOTE — Telephone Encounter (Signed)
Best not to take while on aspirin that we are using to prevent dvt ppx

## 2021-07-09 ENCOUNTER — Telehealth: Payer: Self-pay | Admitting: Neurology

## 2021-07-09 NOTE — Telephone Encounter (Signed)
I called the patient.  She had knee surgery done on 27 September, she got general anesthesia for this, she has had headaches following this which is not uncommon following general anesthesia.  The patient is due for another Aimovig injection, she is to get the injection in now.  She may use for sumatriptan hand if needed.

## 2021-07-09 NOTE — Telephone Encounter (Signed)
Noted thank you

## 2021-07-09 NOTE — Telephone Encounter (Signed)
Pt called stating that she has had a constant headache for the past 4-5 days and is wanting to discuss a change in her medication with the RN. Please advise.

## 2021-07-09 NOTE — Telephone Encounter (Signed)
FYI- Pt called stating she got the refill for the Aimovig on 06/13/2021 or 06/14/2021.

## 2021-07-10 ENCOUNTER — Ambulatory Visit (INDEPENDENT_AMBULATORY_CARE_PROVIDER_SITE_OTHER): Payer: Medicare Other | Admitting: Orthopaedic Surgery

## 2021-07-10 ENCOUNTER — Other Ambulatory Visit: Payer: Self-pay

## 2021-07-10 ENCOUNTER — Encounter: Payer: Self-pay | Admitting: Orthopaedic Surgery

## 2021-07-10 DIAGNOSIS — Z96651 Presence of right artificial knee joint: Secondary | ICD-10-CM

## 2021-07-10 NOTE — Progress Notes (Signed)
Post-Op Visit Note   Patient: Kristin Gilmore           Date of Birth: October 28, 1950           MRN: [id number] Visit Date: 07/10/2021 PCP: Aletha Halim., PA-C   Assessment & Plan:  Chief Complaint:  Chief Complaint  Patient presents with   Right Knee - Post-op Follow-up   Visit Diagnoses:  1. Status post total right knee replacement     Plan: [name] is 2 weeks status post right total knee replacement.  Has aching with walking.  Takes gabapentin and oxycodone and tramadol as needed.  She has progressed very well with physical therapy.  Right knee shows a healed surgical incision without any evidence of infection.  Moderate swelling.  Lacks about 10 degrees of extension but has greater than 90 degrees of flexion.  Stable to varus valgus.  Sutures removed Steri-Strips applied.  Implant card provided.  Dental prophylaxis reinforced.  Continue home health PT until she is ready for outpatient PT.  Recheck in 4 weeks with two-view x-rays of the right knee.  Follow-Up Instructions: Return in about 4 weeks (around 08/07/2021).   Orders:  No orders of the defined types were placed in this encounter.  No orders of the defined types were placed in this encounter.   Imaging: No results found.  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   Past Medical History:  Diagnosis Date   Arthritis    Depression    Dyslipidemia    Hypertension    Migraine    Migraine  without aura, without mention of intractable migraine without mention of status migrainosus 04/13/2014   Parkinson's disease ([place]) 01/24/2021   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   Pre-diabetes    "i was told i was pre-diabetic a while ago"   Tremor 04/13/2014    Family History  Problem Relation Age of Onset   Cancer Father        stomach cancer   Migraines Mother     Past Surgical History:  Procedure Laterality Date   CHOLECYSTECTOMY     COLONOSCOPY W/ POLYPECTOMY     LUMBAR LAMINECTOMY  07/2020   3 level decompression - [place], [place]   LUMBAR LAMINECTOMY/DECOMPRESSION MICRODISCECTOMY N/A 12/19/2017   Procedure: LAMINECTOMY LUMBAR TWO- LUMBAR THREE, LUMBAR THREE- LUMBAR FOUR, LUMBAR FOUR- LUMBAR FIVE ;  Surgeon: Consuella Lose, MD;  Location: [facility];  Service: Neurosurgery;  Laterality: N/A;   NASAL SINUS SURGERY     TONSILLECTOMY     TOTAL KNEE ARTHROPLASTY Right 06/26/2021   Procedure: RIGHT TOTAL KNEE ARTHROPLASTY;  Surgeon: Leandrew Koyanagi, MD;  Location: [facility];  Service: Orthopedics;  Laterality: Right;   WISDOM TOOTH EXTRACTION     Social History   Occupational History   Occupation: Retired  Tobacco Use   Smoking status: Never   Smokeless tobacco: Never  Vaping Use   Vaping Use: Never used  Substance and Sexual Activity   Alcohol use: Yes  Comment: occasional glass of wine   Drug use: No   Sexual activity: Not on file

## 2021-07-11 ENCOUNTER — Telehealth: Payer: Self-pay | Admitting: Neurology

## 2021-07-11 ENCOUNTER — Encounter: Payer: Medicare Other | Admitting: Orthopaedic Surgery

## 2021-07-11 ENCOUNTER — Telehealth: Payer: Self-pay | Admitting: Orthopaedic Surgery

## 2021-07-11 MED ORDER — DEXAMETHASONE 2 MG PO TABS: ORAL_TABLET | ORAL | 0 refills | Status: DC

## 2021-07-11 MED ORDER — VENLAFAXINE HCL ER 75 MG PO CP24: 75.0000 mg | ORAL_CAPSULE | Freq: Every day | ORAL | 3 refills | Status: DC

## 2021-07-11 NOTE — Telephone Encounter (Signed)
Pt states the previous medication for her headaches did not work.  Pt states Dr Jannifer Franklin told her he would try something else for her.  Pt would like a call to discuss the other medication.

## 2021-07-11 NOTE — Telephone Encounter (Signed)
Pt called requesting refill of oxycodone. Please call pt about this matter at [phone].

## 2021-07-11 NOTE — Telephone Encounter (Signed)
I called the patient.  She continues to have daily headaches since the surgery that was done.  I will increase her Effexor to 75 mg daily, she will go on a 3-day course of Decadron.  She will stay on Aimovig for now.

## 2021-07-12 ENCOUNTER — Ambulatory Visit: Payer: Self-pay

## 2021-07-12 ENCOUNTER — Other Ambulatory Visit: Payer: Self-pay

## 2021-07-12 ENCOUNTER — Telehealth: Payer: Self-pay

## 2021-07-12 ENCOUNTER — Telehealth: Payer: Self-pay | Admitting: Specialist

## 2021-07-12 ENCOUNTER — Other Ambulatory Visit: Payer: Self-pay | Admitting: Physician Assistant

## 2021-07-12 ENCOUNTER — Telehealth: Payer: Self-pay | Admitting: *Deleted

## 2021-07-12 ENCOUNTER — Ambulatory Visit (INDEPENDENT_AMBULATORY_CARE_PROVIDER_SITE_OTHER): Payer: Medicare Other | Admitting: Surgery

## 2021-07-12 ENCOUNTER — Telehealth: Payer: Self-pay | Admitting: Orthopaedic Surgery

## 2021-07-12 ENCOUNTER — Encounter: Payer: Self-pay | Admitting: Surgery

## 2021-07-12 ENCOUNTER — Emergency Department (HOSPITAL_BASED_OUTPATIENT_CLINIC_OR_DEPARTMENT_OTHER)
Admission: EM | Admit: 2021-07-12 | Discharge: 2021-07-12 | Discharge status: Against Medical Advice | Payer: Medicare Other

## 2021-07-12 VITALS — Ht 67.5 in | Wt 160.0 lb

## 2021-07-12 DIAGNOSIS — Z96651 Presence of right artificial knee joint: Secondary | ICD-10-CM | POA: Diagnosis not present

## 2021-07-12 DIAGNOSIS — G43719 Chronic migraine without aura, intractable, without status migrainosus: Secondary | ICD-10-CM

## 2021-07-12 DIAGNOSIS — G43011 Migraine without aura, intractable, with status migrainosus: Secondary | ICD-10-CM

## 2021-07-12 MED ORDER — AIMOVIG 140 MG/ML ~~LOC~~ SOAJ: 140.0000 mg | SUBCUTANEOUS | 3 refills | Status: DC

## 2021-07-12 MED ORDER — CEPHALEXIN 500 MG PO CAPS: 500.0000 mg | ORAL_CAPSULE | Freq: Four times a day (QID) | ORAL | 0 refills | Status: DC

## 2021-07-12 MED ORDER — OXYCODONE-ACETAMINOPHEN 5-325 MG PO TABS: 1.0000 | ORAL_TABLET | Freq: Three times a day (TID) | ORAL | 0 refills | Status: DC | PRN

## 2021-07-12 NOTE — Telephone Encounter (Signed)
Sent in

## 2021-07-12 NOTE — Progress Notes (Signed)
Office Visit Note   Patient: Kristin Gilmore           Date of Birth: [birth year]           MRN: [id number] Visit Date: 07/12/2021              Requested by: Aletha Halim., PA-C [address]  [place] [address] PCP: Aletha Halim., PA-C   Assessment & Plan: Visit Diagnoses:  1. Status post total right knee replacement     Plan: With patient's increased right knee pain she was instructed to follow-up with Dr. Erlinda Hong tomorrow since he did her surgery.  My assistant advised me that he wanted Keflex called into the pharmacy and this was done.  Regards to her chronic lumbar spine issues patient was advised that she needs to see Dr. Louanne Skye to discuss further treatment options there.  Follow-Up Instructions: No follow-ups on file.   Orders:  Orders Placed This Encounter  Procedures   XR Knee 1-2 Views Right   Meds ordered this encounter  Medications   cephALEXin (KEFLEX) 500 MG capsule    Sig: Take 1 capsule (500 mg total) by mouth 4 (four) times daily.    Dispense:  30 capsule    Refill:  0    Ok to give per Dr Erlinda Hong      Procedures: No procedures performed   Clinical Data: No additional findings.   Subjective: No chief complaint on file.   HPI 70 year old female comes in with complaints of right knee pain.  She is status post right total knee replacement by Dr. Erlinda Hong June 26, 2021 and was last seen by him 2 days ago.  States that she has been having increased pain in her knee.  No drainage.  Still has her Steri-Strips on.  States that she feels like her knee is tight.  Patient was only supposed to see me regarding right knee pain since her physician that did the surgery was not in the clinic.  She then brought up ongoing chronic issues with her lumbar spine.  I had seen patient for this June 14, 2021 and I had ordered MRI lumbar spine and she was supposed to follow-up with Dr. Louanne Skye for that.  She has not had the MRI as of  yet.    Objective: Vital Signs: Ht 5' 7.5" (1.715 m)   Wt 160 lb (72.6 kg)   BMI 24.69 kg/m   Physical Exam Right knee range of motion about 0-100 degrees.  Steri-Strips intact.  No drainage or signs infection.  She has some swelling of the knee.  Calf nontender. Ortho Exam  Specialty Comments:  No specialty comments available.  Imaging: No results found.   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   Past Medical History:  Diagnosis Date   Arthritis    Depression    Dyslipidemia    Hypertension    Migraine    Migraine without aura, without mention of intractable migraine without mention of  status migrainosus 04/13/2014   Parkinson's disease ([place]) 01/24/2021   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   Pre-diabetes    "i was told i was pre-diabetic a while ago"   Tremor 04/13/2014    Family History  Problem Relation Age of Onset   Cancer Father        stomach cancer   Migraines Mother     Past Surgical History:  Procedure Laterality Date   CHOLECYSTECTOMY     COLONOSCOPY W/ POLYPECTOMY     LUMBAR LAMINECTOMY  07/2020   3 level decompression - [place], [place]   LUMBAR LAMINECTOMY/DECOMPRESSION MICRODISCECTOMY N/A 12/19/2017   Procedure: LAMINECTOMY LUMBAR TWO- LUMBAR THREE, LUMBAR THREE- LUMBAR FOUR, LUMBAR FOUR- LUMBAR FIVE ;  Surgeon: Consuella Lose, MD;  Location: [facility];  Service: Neurosurgery;  Laterality: N/A;   NASAL SINUS SURGERY     TONSILLECTOMY      TOTAL KNEE ARTHROPLASTY Right 06/26/2021   Procedure: RIGHT TOTAL KNEE ARTHROPLASTY;  Surgeon: Leandrew Koyanagi, MD;  Location: [facility];  Service: Orthopedics;  Laterality: Right;   WISDOM TOOTH EXTRACTION     Social History   Occupational History   Occupation: Retired  Tobacco Use   Smoking status: Never   Smokeless tobacco: Never  Vaping Use   Vaping Use: Never used  Substance and Sexual Activity   Alcohol use: Yes    Comment: occasional glass of wine   Drug use: No   Sexual activity: Not on file

## 2021-07-12 NOTE — Telephone Encounter (Signed)
See other message

## 2021-07-12 NOTE — Telephone Encounter (Signed)
Patient called. She would like for [name] to call her. [phone]. Says she is in pain.

## 2021-07-12 NOTE — Telephone Encounter (Signed)
Refill on oxycodone. [facility] [address] Would like call back

## 2021-07-12 NOTE — Telephone Encounter (Signed)
Patient came in today to see Jeneen Rinks, she states that she is having a lot of back pain she wanted to see Dr. Louanne Skye today, I advised that we know she hurting however until she gets her MRI there is nothing he can do because he doesn't know what is causing her pain till she gets it. I gave her the number to [facility], to call and see if they have any cancellations.

## 2021-07-12 NOTE — Telephone Encounter (Signed)
[employer] form signed, faxed to [employer]. Received confirmation.

## 2021-07-12 NOTE — Telephone Encounter (Signed)
Already sent in

## 2021-07-12 NOTE — ED Triage Notes (Signed)
Pt brought into triage, asked this RN if we had MRI machine, when told we do not have on eon this campus she states "this is ridiculous" and had husband wheel her out of triage. Offered to still give patient medical evaluation. Pt declined.

## 2021-07-12 NOTE — Telephone Encounter (Signed)
Received Psychologist, counselling for Aimovig 140 mg. Form filled out, Rx e scribed ot [facility], form placed on Dr Jannifer Franklin' desk for signature.

## 2021-07-12 NOTE — Telephone Encounter (Signed)
Please advise on message below.  Patient is here now on [place] schedule for her back.

## 2021-07-12 NOTE — Telephone Encounter (Signed)
Called patient again. Medication was sent in yesterday. Patient notified.

## 2021-07-12 NOTE — Telephone Encounter (Signed)
The form for the Aimovig was signed.

## 2021-07-12 NOTE — Telephone Encounter (Signed)
Tried to call patient. No answer. Left message that medication has been sent to pharmacy.

## 2021-07-12 NOTE — Telephone Encounter (Signed)
Patient called stating that she had a fall and that her right knee is hurting, she has a temperature, and that she is not feeling well.  Patient had right knee surgery on 06/26/2021.  Would like to know what she needs to do?

## 2021-07-12 NOTE — Telephone Encounter (Signed)
Pt called requesting an immediate call back from [place]. Pt states can [name] please call her as soon as possible. Pt phone number is [phone].

## 2021-07-12 NOTE — Telephone Encounter (Signed)
Called and left a VM for patient to [facility] concerning appointment for today.

## 2021-07-13 ENCOUNTER — Telehealth: Payer: Self-pay | Admitting: Specialist

## 2021-07-13 ENCOUNTER — Encounter: Payer: Medicare Other | Admitting: Orthopaedic Surgery

## 2021-07-13 NOTE — Telephone Encounter (Signed)
Patient called. She needs something called in for her to calm her nerves before here MRI. Her call back number is [phone]

## 2021-07-16 ENCOUNTER — Telehealth: Payer: Self-pay | Admitting: *Deleted

## 2021-07-16 MED ORDER — DICLOFENAC POTASSIUM 50 MG PO TABS: 50.0000 mg | ORAL_TABLET | Freq: Three times a day (TID) | ORAL | 1 refills | Status: DC | PRN

## 2021-07-16 NOTE — Telephone Encounter (Signed)
Patient called back and stated her husband is sick but may be able to come this afternoon to bring [employer] papers. She then asked if she should still take Aimovig, has had 1 injection and was due 2 days ago. She stated she can afford to get it at $120 for this month. I advised she can still apply to see if she qualifies for assistance. She stated she will apply but will pick up refill today. I asked her if that is affordable foe her and she said "oh yes it is." Patient verbalized understanding, appreciation.

## 2021-07-16 NOTE — Telephone Encounter (Signed)
I have tried calling the patient multiple times, the line is always busy, unable to get through.  I will try back later.

## 2021-07-16 NOTE — Telephone Encounter (Signed)
I called the patient.  I will send in a prescription for diclofenac potassium to take if needed for headache.  This may help reduce the number of Imitrex tablets that she is taking.  She just now got in her shot of Aimovig, hopefully this will help reduce her headaches.

## 2021-07-16 NOTE — Telephone Encounter (Signed)
Patient called back, stated she feels a headache coming on and wants to know if she can take sumatriptan. She took one on Sat and one on Sun. She stated Dr Jannifer Franklin told her not to take too many.  I advised she may take one today to hopefully prevent headache,  and reminded her she is limited in how many she can take in a month. She stated he told her not to take more than 3-4 a week. I advised she go ahead and take one; I will send this message to Dr Jannifer Franklin. She then reported that her fingers are cold on one hand and asked if that is a symptom of Parkinson's, I advised Dr Jannifer Franklin can address her questions. She is aware he is out of office this week. Patient verbalized understanding, appreciation.

## 2021-07-16 NOTE — Telephone Encounter (Signed)
Received fax from [place]: needing additional information, pages 1&2 which we did not receive. This is patient information. I will call patient later today to discuss. Fax also indicates there is a page 4; we never received page 4.

## 2021-07-16 NOTE — Telephone Encounter (Signed)
Called patient who asked me to speak to her husband, stated he's taking care of it. He was unable to take call; she stated she'll have him call me. She stated he is bringing over additional information for me to fax today. I advised will wait for call or information. Patient verbalized understanding, appreciation.

## 2021-07-17 ENCOUNTER — Telehealth: Payer: Self-pay | Admitting: Neurology

## 2021-07-17 NOTE — Telephone Encounter (Signed)
I called the patient. I left a message, I will call back later. 

## 2021-07-17 NOTE — Telephone Encounter (Signed)
Pt called says her hands are tingling and numb, pt requesting a call back on what she should do.

## 2021-07-18 ENCOUNTER — Ambulatory Visit
Admission: RE | Admit: 2021-07-18 | Discharge: 2021-07-18 | Disposition: A | Discharge status: Home / Self Care | Payer: Medicare Other | Source: Ambulatory Visit | Attending: Surgery | Admitting: Surgery

## 2021-07-18 ENCOUNTER — Telehealth: Payer: Self-pay | Admitting: Specialist

## 2021-07-18 ENCOUNTER — Telehealth: Payer: Self-pay | Admitting: Neurology

## 2021-07-18 ENCOUNTER — Telehealth: Payer: Self-pay | Admitting: Physician Assistant

## 2021-07-18 DIAGNOSIS — M48062 Spinal stenosis, lumbar region with neurogenic claudication: Secondary | ICD-10-CM

## 2021-07-18 DIAGNOSIS — M5136 Other intervertebral disc degeneration, lumbar region: Secondary | ICD-10-CM

## 2021-07-18 DIAGNOSIS — M4726 Other spondylosis with radiculopathy, lumbar region: Secondary | ICD-10-CM

## 2021-07-18 DIAGNOSIS — M4316 Spondylolisthesis, lumbar region: Secondary | ICD-10-CM

## 2021-07-18 IMAGING — MR MR LUMBAR SPINE WO/W CM
4 of 8 series · 18 of 48 positions shown · IV contrast (15 mL Multihance)
Comparison: 12/01/2019.

CLINICAL DATA: Low back pain

EXAM:
MRI LUMBAR SPINE WITHOUT AND WITH CONTRAST
TECHNIQUE: Multiplanar and multiecho pulse sequences of the lumbar spine were
obtained without and with intravenous contrast.
CONTRAST:  15mL MULTIHANCE GADOBENATE DIMEGLUMINE 529 MG/ML IV SOLN

[Series 6: T1 · sagittal · 4.0mm · 0.73mm/px · 3 of 15 slices shown]
[im 1/15]
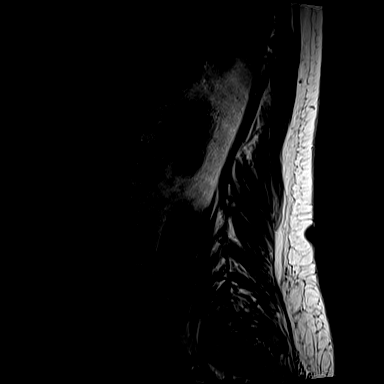
[im 10/15]
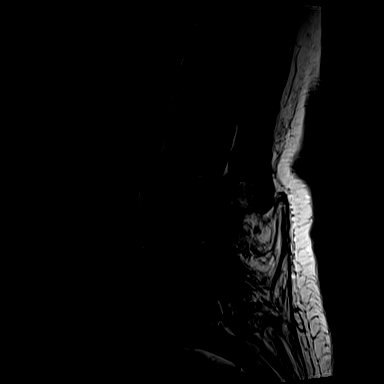
[im 15/15]
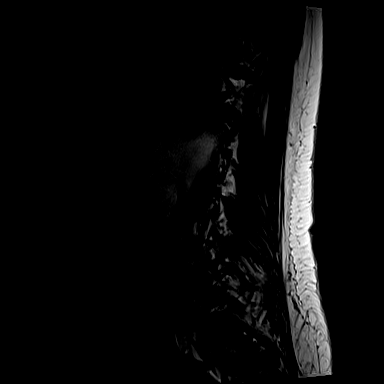

[Series 13: T2 · axial · 4.0mm · 0.28mm/px · z∈[-7,+174]mm · 8 of 35 slices shown (1 of 3)]
[im 1/35]
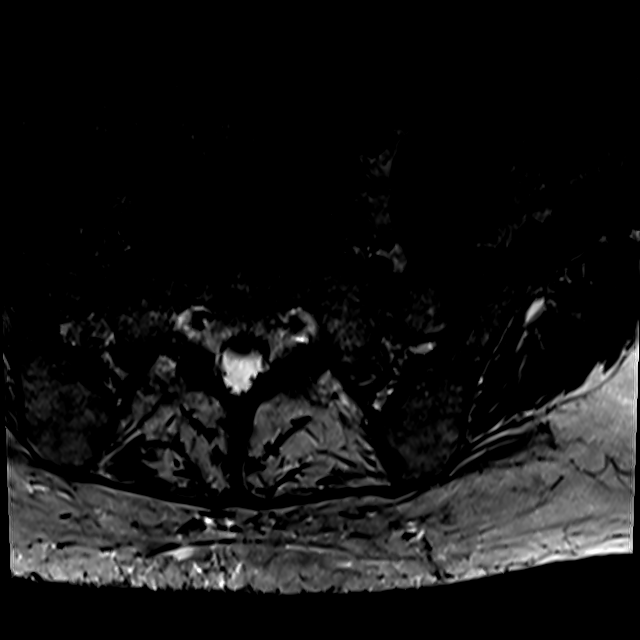
[im 5/35]
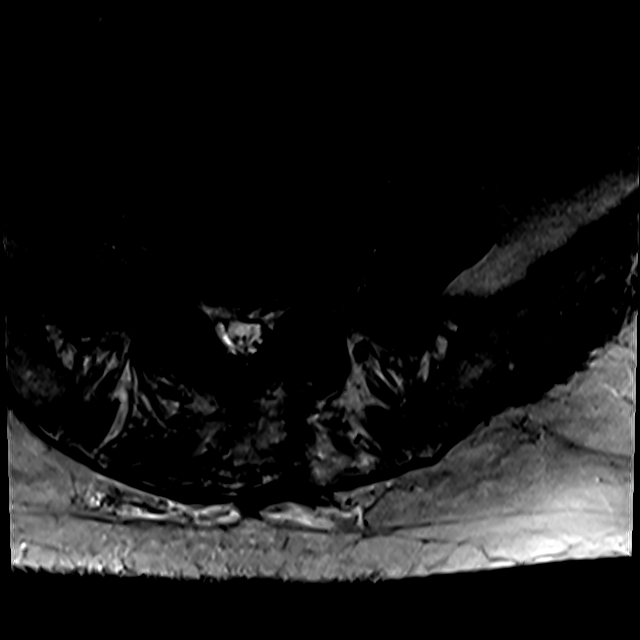
[im 10/35]
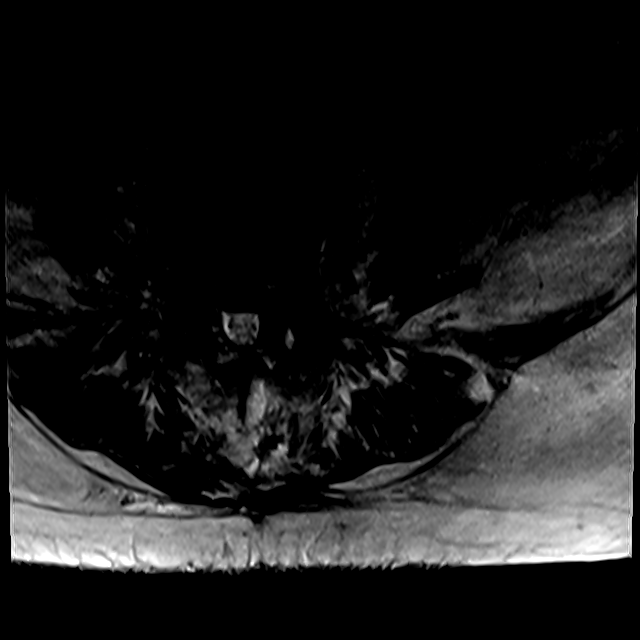
[im 15/35]
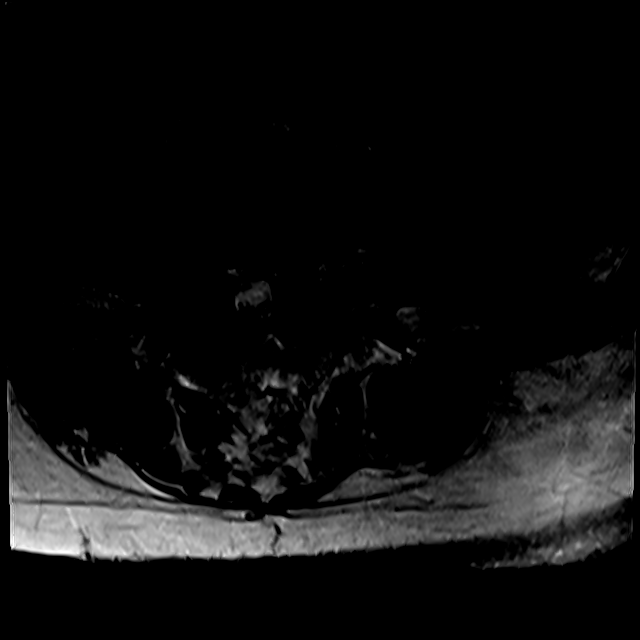
[im 20/35]
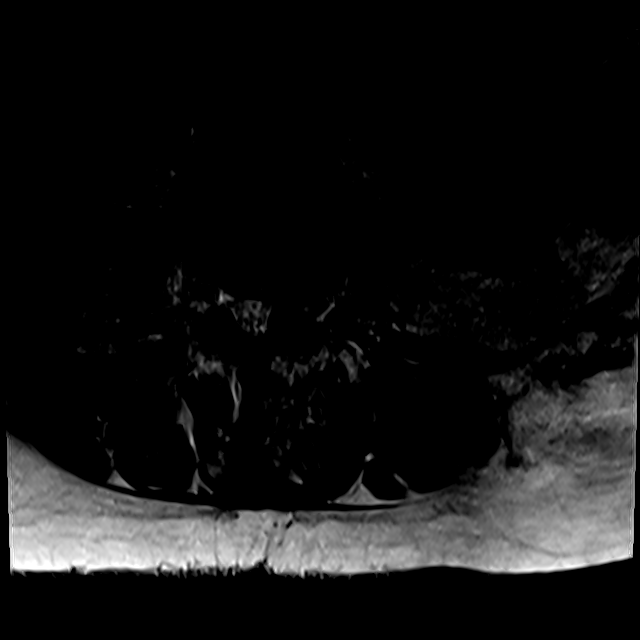
[im 25/35]
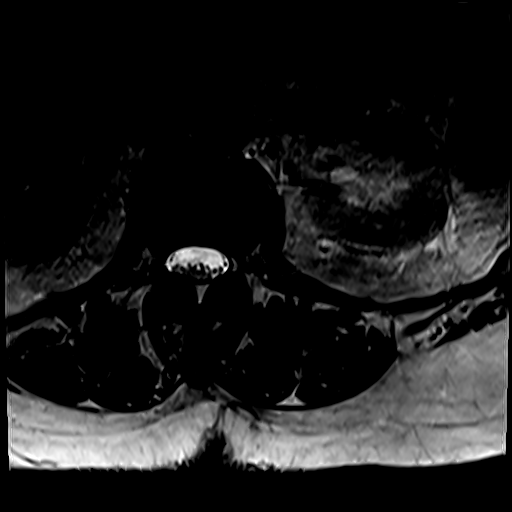
[im 30/35]
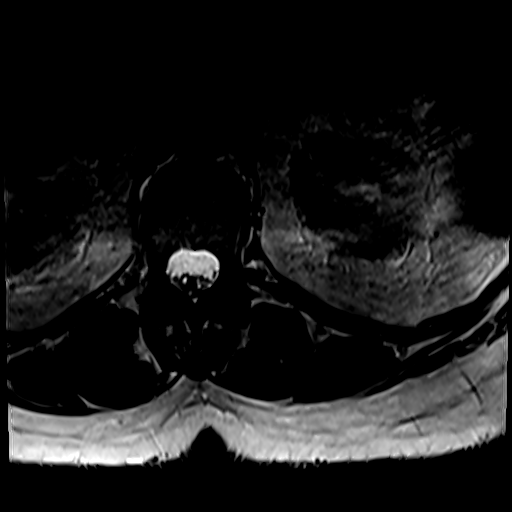
[im 35/35]
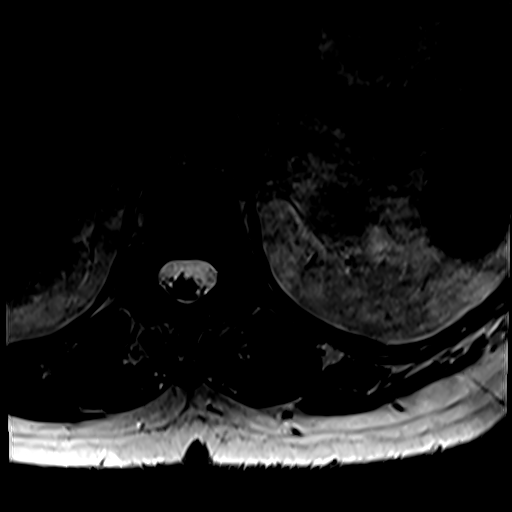

[Series 14: T2 · sagittal · 4.0mm · 0.73mm/px · 4 of 15 slices shown (2 of 3)]
[im 1/15]
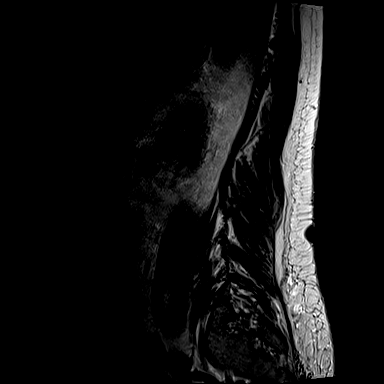
[im 5/15]
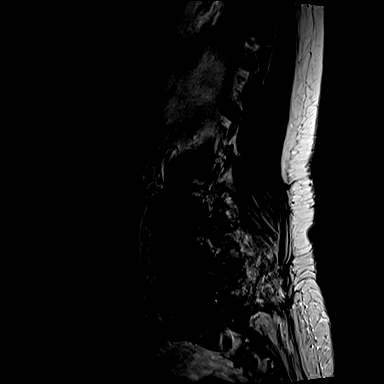
[im 10/15]
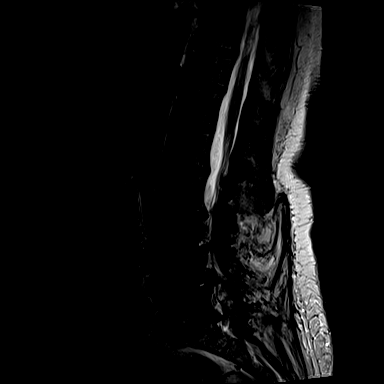
[im 15/15]
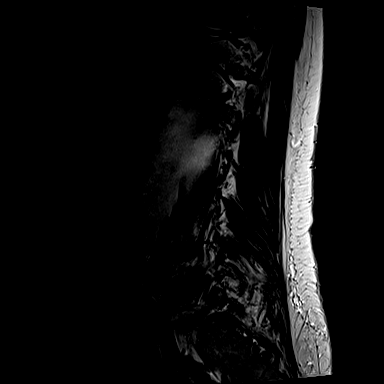

[Series 100: T2 · axial · 4.0mm · 0.28mm/px · z∈[+13,+150]mm · 3 of 35 slices shown (3 of 3)]
[im 5/35]
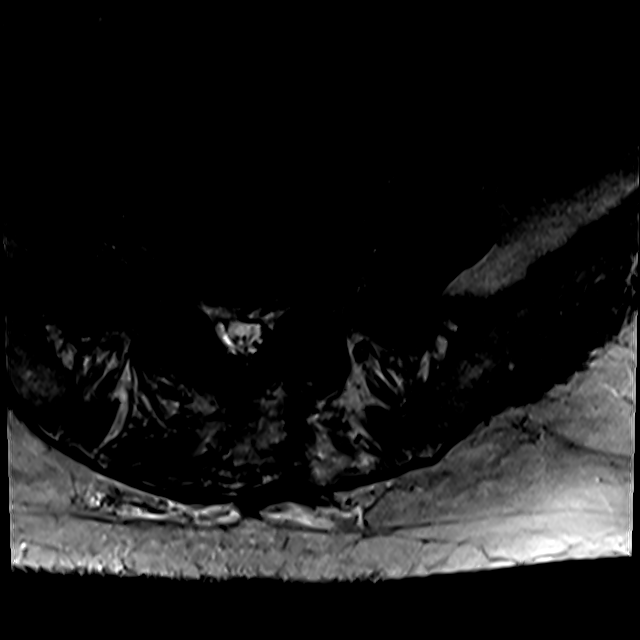
[im 20/35]
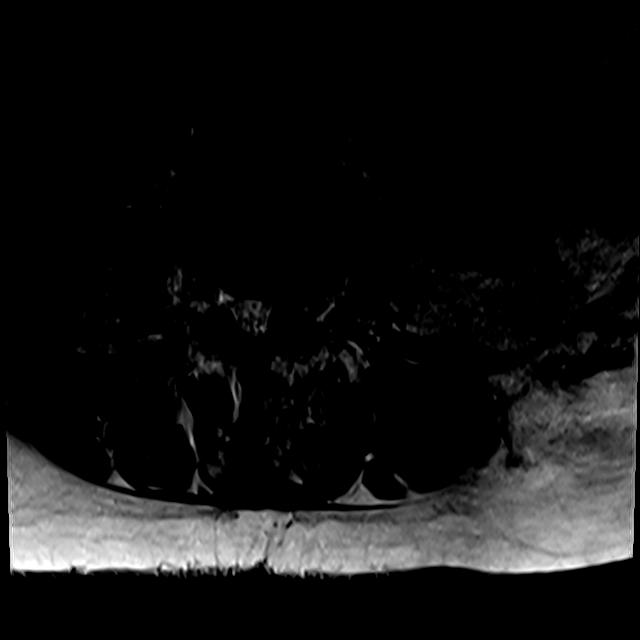
[im 30/35]
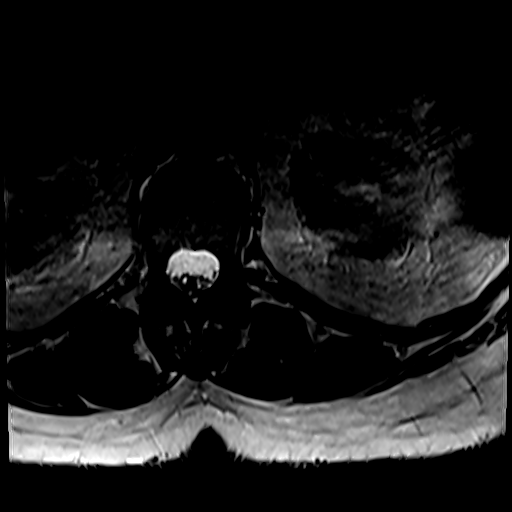

[18 of 48 positions shown; findings below may reference images not displayed]

FINDINGS: Evaluation is somewhat limited by motion artifact.

Segmentation: Standard.

Alignment: Redemonstrated grade 1 anterolisthesis L3 on L4 and L4 on
L5.

Vertebrae: No fracture or primary bone lesion. Redemonstrated
endplate degenerative changes at L3-L4 and L4-L5. status post L3-L5
posterior decompression.

Conus medullaris and cauda equina: Conus extends to the L1 level.
Conus and cauda equina appear normal.

Paraspinal and other soft tissues: Atrophy of the inferior
paraspinous musculature. Otherwise negative.

Disc levels:

T12-L1: No significant disc bulge. No spinal canal stenosis or
neural foraminal narrowing.

L1-L2: No significant disc bulge. No spinal canal stenosis or neural
foraminal narrowing.

L2-L3: Broad-based disc bulge. Moderate facet arthropathy. No spinal
canal stenosis or neural foraminal narrowing.

L3-L4: Status post posterior decompression. Grade 1 anterolisthesis
with disc unroofing. No spinal canal stenosis. Moderate to severe
facet arthropathy. Moderate to severe right and mild left neural
foraminal narrowing, which has progressed on the right.

L4-L5: Status post posterior decompression. Grade 1 anterolisthesis
with disc unroofing. Moderate to severe facet arthropathy. No spinal
canal stenosis. Severe bilateral neural foraminal narrowing, which
has likely progressed on the right.

L5-S1: Disc height loss with disc osteophyte complex. Moderate to
severe facet arthropathy. No spinal canal stenosis. Severe left and
mild right neural foraminal narrowing, unchanged.
IMPRESSION: 1. L4-L5 severe bilateral neural foraminal narrowing, which has
likely progressed on the right.
2. L3-L4 moderate to severe right and mild left neural foraminal
narrowing, which has progressed on the right.
3. L5-S1 severe left and mild right neural foraminal narrowing,
unchanged.

## 2021-07-18 MED ORDER — GADOBENATE DIMEGLUMINE 529 MG/ML IV SOLN
15.0000 mL | Freq: Once | INTRAVENOUS | Status: AC | PRN
  Administered 2021-07-18: 15 mL via INTRAVENOUS

## 2021-07-18 NOTE — Telephone Encounter (Signed)
Patient called. She says she is having a MRI today, would need an appointment with Dr Louanne Skye Friday or next week.

## 2021-07-18 NOTE — Telephone Encounter (Signed)
Pt called wanting to know does she need both the SUMAtriptan (IMITREX) 100 MG tablet and the diclofenac (CATAFLAM) 50 MG tablet or does she only need to be taking one. Pt requesting a call back.

## 2021-07-18 NOTE — Telephone Encounter (Signed)
She isI called the patient.  I left a message, if the patient is still having numbness in the hands, she is to contact our office again and let me know and I will call her back.

## 2021-07-18 NOTE — Telephone Encounter (Signed)
Pt called requesting medication to keep her calm for her calm during her MRI today at 5pm. Pt states she called days ago. Please send to pharmacy on file. Pt is asking for a call when medication has been sent to pharmacy on file. Pt phone number is [phone].

## 2021-07-18 NOTE — Telephone Encounter (Signed)
Dr. Jannifer Franklin has left a vm and is trying to reach the pt to discuss her questions.

## 2021-07-18 NOTE — Telephone Encounter (Signed)
Pt called being extremely rude very call. Demanding refills of oxycodone. Pt was notified it's too soon for a refill of oxycodone. Pt also called earlier for Dr. Louanne Skye wanted medication to calm her down for her MRI today. Pt was speaking in a very demanding tone. Notified Dr. Erlinda Hong and PA Venida Jarvis team. This is an FYI also for Dr. Otho Ket team

## 2021-07-19 ENCOUNTER — Telehealth: Payer: Self-pay | Admitting: Orthopaedic Surgery

## 2021-07-19 ENCOUNTER — Other Ambulatory Visit: Payer: Self-pay | Admitting: Physician Assistant

## 2021-07-19 MED ORDER — OXYCODONE-ACETAMINOPHEN 5-325 MG PO TABS: 1.0000 | ORAL_TABLET | Freq: Three times a day (TID) | ORAL | 0 refills | Status: DC | PRN

## 2021-07-19 NOTE — Telephone Encounter (Signed)
Patient called asked when will she be able to start taking her vitamins again?  Patient said she had to stop taking them prior to surgery. The number to contact patient is [phone]

## 2021-07-19 NOTE — Telephone Encounter (Signed)
Pt has called back stating in Dr Tobey Grim vm he did not respond to her question.  Pt is asking that Dr Jannifer Franklin calls again but this time after 2pm.

## 2021-07-19 NOTE — Telephone Encounter (Signed)
Pt called stating she is in a lot of pain and would like a CB from Dr.Xu or [facility]

## 2021-07-19 NOTE — Telephone Encounter (Signed)
Sent in refill of oxy, but need to wean to 1 every 8 hours.  Make sure she is aware so she does not run out early.  Next refill will need to wean to norco

## 2021-07-19 NOTE — Telephone Encounter (Signed)
IC discussed in detail with patient. She verbalized understandning.

## 2021-07-20 NOTE — Telephone Encounter (Signed)
Scheduled for 07/25/21 @ 1030

## 2021-07-20 NOTE — Telephone Encounter (Signed)
IC advised.  

## 2021-07-20 NOTE — Telephone Encounter (Signed)
All vitamins can be restarted with the exception of fish oil.  She can restart that 6 weeks po once she has finished aspirin

## 2021-07-20 NOTE — Telephone Encounter (Signed)
I would just ice and back off activity

## 2021-07-20 NOTE — Telephone Encounter (Signed)
Left vm with detailed message

## 2021-07-20 NOTE — Telephone Encounter (Signed)
Pt called back and states she's confused about not taking the fish oil until after she finishes her aspirin. She would like a CB please and states we can LVM if she doesn't answer.   [phone]

## 2021-07-20 NOTE — Telephone Encounter (Signed)
I called the patient again, left another message, I will call back later. 

## 2021-07-22 NOTE — Telephone Encounter (Signed)
I called the patient again, left another message.  If she is still having issues, she is to contact our office next week and I will call her back.

## 2021-07-25 ENCOUNTER — Ambulatory Visit: Payer: Medicare Other | Admitting: Specialist

## 2021-07-26 ENCOUNTER — Ambulatory Visit: Payer: Medicare Other | Admitting: Specialist

## 2021-07-30 ENCOUNTER — Other Ambulatory Visit (HOSPITAL_BASED_OUTPATIENT_CLINIC_OR_DEPARTMENT_OTHER): Payer: Self-pay | Admitting: Family Medicine

## 2021-07-30 DIAGNOSIS — R109 Unspecified abdominal pain: Secondary | ICD-10-CM

## 2021-07-30 DIAGNOSIS — R140 Abdominal distension (gaseous): Secondary | ICD-10-CM

## 2021-07-30 DIAGNOSIS — R634 Abnormal weight loss: Secondary | ICD-10-CM

## 2021-07-30 DIAGNOSIS — R630 Anorexia: Secondary | ICD-10-CM

## 2021-07-31 ENCOUNTER — Other Ambulatory Visit: Payer: Self-pay

## 2021-07-31 ENCOUNTER — Encounter (HOSPITAL_BASED_OUTPATIENT_CLINIC_OR_DEPARTMENT_OTHER): Payer: Self-pay

## 2021-07-31 ENCOUNTER — Ambulatory Visit (HOSPITAL_BASED_OUTPATIENT_CLINIC_OR_DEPARTMENT_OTHER)
Admission: RE | Admit: 2021-07-31 | Discharge: 2021-07-31 | Disposition: A | Discharge status: Home / Self Care | Payer: Medicare Other | Source: Ambulatory Visit | Attending: Family Medicine | Admitting: Family Medicine

## 2021-07-31 DIAGNOSIS — R630 Anorexia: Secondary | ICD-10-CM | POA: Insufficient documentation

## 2021-07-31 DIAGNOSIS — R140 Abdominal distension (gaseous): Secondary | ICD-10-CM | POA: Diagnosis present

## 2021-07-31 DIAGNOSIS — R109 Unspecified abdominal pain: Secondary | ICD-10-CM | POA: Insufficient documentation

## 2021-07-31 DIAGNOSIS — R634 Abnormal weight loss: Secondary | ICD-10-CM | POA: Diagnosis not present

## 2021-07-31 LAB — POCT I-STAT CREATININE: Creatinine, Ser: 1.3 mg/dL — ABNORMAL HIGH (ref 0.44–1.00)

## 2021-07-31 MED ORDER — IOHEXOL 300 MG/ML  SOLN
75.0000 mL | Freq: Once | INTRAMUSCULAR | Status: AC | PRN
  Administered 2021-07-31: 75 mL via INTRAVENOUS

## 2021-08-02 ENCOUNTER — Other Ambulatory Visit: Payer: Self-pay | Admitting: Neurology

## 2021-08-03 ENCOUNTER — Telehealth: Payer: Self-pay | Admitting: Orthopaedic Surgery

## 2021-08-03 NOTE — Telephone Encounter (Signed)
Pt called stating she had surgery on her knee and there's a string hanging out. Pt would like a CB  to let her know if she should pull it, cut it, or leave it alone.   [phone]

## 2021-08-06 ENCOUNTER — Encounter: Payer: Self-pay | Admitting: Specialist

## 2021-08-06 ENCOUNTER — Ambulatory Visit (INDEPENDENT_AMBULATORY_CARE_PROVIDER_SITE_OTHER): Payer: Medicare Other | Admitting: Specialist

## 2021-08-06 ENCOUNTER — Other Ambulatory Visit: Payer: Self-pay

## 2021-08-06 VITALS — BP 158/68 | HR 73 | Ht 67.5 in | Wt 155.0 lb

## 2021-08-06 DIAGNOSIS — M5440 Lumbago with sciatica, unspecified side: Secondary | ICD-10-CM

## 2021-08-06 DIAGNOSIS — M48062 Spinal stenosis, lumbar region with neurogenic claudication: Secondary | ICD-10-CM

## 2021-08-06 DIAGNOSIS — M5136 Other intervertebral disc degeneration, lumbar region: Secondary | ICD-10-CM

## 2021-08-06 DIAGNOSIS — M4156 Other secondary scoliosis, lumbar region: Secondary | ICD-10-CM

## 2021-08-06 DIAGNOSIS — M5416 Radiculopathy, lumbar region: Secondary | ICD-10-CM

## 2021-08-06 DIAGNOSIS — M4726 Other spondylosis with radiculopathy, lumbar region: Secondary | ICD-10-CM

## 2021-08-06 DIAGNOSIS — M4316 Spondylolisthesis, lumbar region: Secondary | ICD-10-CM

## 2021-08-06 DIAGNOSIS — M48061 Spinal stenosis, lumbar region without neurogenic claudication: Secondary | ICD-10-CM

## 2021-08-06 NOTE — Telephone Encounter (Signed)
She should cover it and we'll look at it tomorrow in the office

## 2021-08-06 NOTE — Progress Notes (Addendum)
Office Visit Note   Patient: Kristin Gilmore           Date of Birth: 06/28/1951           MRN: [id number] Visit Date: 08/06/2021              Requested by: Aletha Halim., PA-C [address]  [place] [address] PCP: Aletha Halim., PA-C   Assessment & Plan: Visit Diagnoses:  1. Acute bilateral low back pain with sciatica, sciatica laterality unspecified   2. Spondylolisthesis, lumbar region   3. DDD (degenerative disc disease), lumbar   4. Spinal stenosis of lumbar region with neurogenic claudication   5. Other spondylosis with radiculopathy, lumbar region   6. Radiculopathy, lumbar region   7. Spinal stenosis of lumbar region without neurogenic claudication   8. Other secondary scoliosis, lumbar region     Plan: Avoid bending, stooping and avoid lifting weights greater than 10 lbs. Avoid prolong standing and walking. Order for a new walker with wheels. Surgery scheduling secretary Kandice Hams, will call you in the next week to schedule for surgery.  Surgery recommended is a four level lumbar fusion L2-3, L3-4, L4-5 and  L5-S1 this would be done with rods, screws and cages with local bone graft and allograft (donor bone graft). Take oxycodone for for pain. Risk of surgery includes risk of infection 1 in 200 patients, bleeding 1/2% chance you would need a transfusion.   Risk to the nerves is one in 10,000. You will need to use a brace for 3 months and wean from the brace on the 4th month. Expect improved walking and standing tolerance. Expect relief of leg pain but numbness may persist depending on the length and degree of pressure that has been present.  A bone density test is recommend prior to any surgical fusion surgery in order to discern if  The risk of hardware failure is greater due to weak bone strength. 91% of female patients with Parkinson's Disease have osteoporosis.  Follow-Up Instructions: No follow-ups on file.   Orders:  No orders of  the defined types were placed in this encounter.  No orders of the defined types were placed in this encounter.     Procedures: No procedures performed   Clinical Data: No additional findings.   Subjective: Chief Complaint  Patient presents with   Lower Back - Follow-up    MRI Review pain is worsening, and it earlier in the day    HPI  Review of Systems  Constitutional: Negative.   HENT: Negative.    Eyes: Negative.   Respiratory: Negative.    Cardiovascular: Negative.   Gastrointestinal: Negative.   Endocrine: Negative.   Genitourinary: Negative.   Musculoskeletal: Negative.   Skin: Negative.   Allergic/Immunologic: Negative.   Neurological: Negative.   Hematological: Negative.   Psychiatric/Behavioral: Negative.      Objective: Vital Signs: BP (!) 158/68 (BP Location: Left Arm, Patient Position: Sitting)   Pulse 73   Ht 5' 7.5" (1.715 m)   Wt 155 lb (70.3 kg)   BMI 23.92 kg/m   Physical Exam Constitutional:      Appearance: She is well-developed.  HENT:     Head: Normocephalic and atraumatic.  Eyes:     Pupils: Pupils are equal, round, and reactive to light.  Pulmonary:     Effort: Pulmonary effort is normal.     Breath sounds: Normal breath sounds.  Abdominal:     General: Bowel sounds are  normal.     Palpations: Abdomen is soft.  Musculoskeletal:        General: Normal range of motion.     Cervical back: Normal range of motion and neck supple.  Skin:    General: Skin is warm and dry.  Neurological:     Mental Status: She is alert and oriented to person, place, and time.  Psychiatric:        Behavior: Behavior normal.        Thought Content: Thought content normal.        Judgment: Judgment normal.   Ortho Exam  Specialty Comments:  No specialty comments available.  Imaging: No results found.   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   Past Medical History:  Diagnosis Date   Arthritis    Depression    Dyslipidemia    Hypertension    Migraine    Migraine without aura, without mention of intractable migraine without mention of status migrainosus 04/13/2014   Parkinson's disease ([place]) 01/24/2021   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   Pre-diabetes    "i was told i was pre-diabetic a while ago"   Tremor 04/13/2014    Family History  Problem Relation Age of Onset   Cancer Father        stomach cancer   Migraines Mother     Past Surgical History:  Procedure Laterality Date   CHOLECYSTECTOMY     COLONOSCOPY W/ POLYPECTOMY     LUMBAR LAMINECTOMY  07/2020   3 level decompression - [place], [place]   LUMBAR LAMINECTOMY/DECOMPRESSION MICRODISCECTOMY N/A 12/19/2017   Procedure: LAMINECTOMY LUMBAR TWO- LUMBAR THREE, LUMBAR THREE- LUMBAR FOUR, LUMBAR FOUR- LUMBAR FIVE ;  Surgeon: Consuella Lose, MD;  Location: [facility];  Service: Neurosurgery;  Laterality: N/A;   NASAL SINUS SURGERY     TONSILLECTOMY     TOTAL KNEE ARTHROPLASTY Right 06/26/2021   Procedure: RIGHT TOTAL KNEE ARTHROPLASTY;  Surgeon: Leandrew Koyanagi, MD;  Location: [facility];  Service: Orthopedics;  Laterality: Right;   WISDOM TOOTH EXTRACTION     Social History   Occupational History   Occupation: Retired  Tobacco Use   Smoking status: Never   Smokeless tobacco: Never  Vaping Use   Vaping Use: Never used  Substance and Sexual Activity   Alcohol use: Yes    Comment: occasional glass of wine   Drug use: No   Sexual activity: Not on  file

## 2021-08-06 NOTE — Patient Instructions (Addendum)
Plan: Avoid bending, stooping and avoid lifting weights greater than 10 lbs. Avoid prolong standing and walking. Order for a new walker with wheels. Surgery scheduling secretary Kandice Hams, will call you in the next week to schedule for surgery.  Surgery recommended is a four level lumbar fusion L2-3, L3-4, L4-5 and  L5-S1 this would be done with rods, screws and cages with local bone graft and allograft (donor bone graft). Take oxycodone for for pain. Risk of surgery includes risk of infection 1 in 200 patients, bleeding 1/2% chance you would need a transfusion.   Risk to the nerves is one in 10,000. You will need to use a brace for 3 months and wean from the brace on the 4th month. Expect improved walking and standing tolerance. Expect relief of leg pain but numbness may persist depending on the length and degree of pressure that has been present.  A bone density test is recommend prior to any surgical fusion surgery in order to discern if  The risk of hardware failure is greater due to weak bone strength. 91% of female patients with Parkinson's Disease have osteoporosis.

## 2021-08-06 NOTE — Telephone Encounter (Signed)
Patient aware of the below message  

## 2021-08-07 ENCOUNTER — Ambulatory Visit: Payer: Medicare Other | Admitting: Orthopaedic Surgery

## 2021-08-09 ENCOUNTER — Other Ambulatory Visit: Payer: Self-pay | Admitting: Specialist

## 2021-08-09 ENCOUNTER — Telehealth: Payer: Self-pay | Admitting: Radiology

## 2021-08-09 NOTE — Telephone Encounter (Signed)
Patient called. She would like refills on oxycodone and Keflex. Her call back number is [phone]

## 2021-08-09 NOTE — Telephone Encounter (Addendum)
Patient wants a refill on her Keflex. Per Dr. Louanne Skye this needs to order from Dr. Erlinda Hong if he feels she needs it.

## 2021-08-10 ENCOUNTER — Other Ambulatory Visit: Payer: Self-pay | Admitting: Physician Assistant

## 2021-08-10 MED ORDER — HYDROCODONE-ACETAMINOPHEN 5-325 MG PO TABS: 1.0000 | ORAL_TABLET | Freq: Three times a day (TID) | ORAL | 0 refills | Status: DC | PRN

## 2021-08-10 NOTE — Telephone Encounter (Signed)
Weaning to norco and this was sent in

## 2021-08-10 NOTE — Telephone Encounter (Signed)
Called to advise on message. States she didn't request getting a RF.

## 2021-08-10 NOTE — Progress Notes (Unsigned)
Norco

## 2021-08-10 NOTE — Telephone Encounter (Signed)
No need to continue keflex from knee standpoint.

## 2021-08-11 ENCOUNTER — Other Ambulatory Visit: Payer: Self-pay | Admitting: Physician Assistant

## 2021-08-13 ENCOUNTER — Telehealth: Payer: Self-pay | Admitting: Specialist

## 2021-08-13 NOTE — Telephone Encounter (Signed)
I spoke with Dr. Louanne Skye he states that he agrees with her being on the hydrocodone for her pain. I called and lmom advising her of this.

## 2021-08-13 NOTE — Telephone Encounter (Signed)
Patient called asked for a call back concerning the medication that was prescribed for her by Mendel Ryder. (Hydrocodone)   Patient said Dr Louanne Skye had her on Oxycodone and not Hydrocodone.   The number to contact patient is  [phone]

## 2021-08-14 ENCOUNTER — Telehealth: Payer: Self-pay

## 2021-08-14 NOTE — Telephone Encounter (Signed)
Patient left voice mail inquiring about Bone Density test and surgery being scheduled.  Need surgery sheet.

## 2021-08-15 ENCOUNTER — Encounter: Payer: Self-pay | Admitting: Specialist

## 2021-08-15 ENCOUNTER — Other Ambulatory Visit: Payer: Self-pay | Admitting: Radiology

## 2021-08-15 DIAGNOSIS — G20 Parkinson's disease: Secondary | ICD-10-CM

## 2021-08-15 DIAGNOSIS — Z780 Asymptomatic menopausal state: Secondary | ICD-10-CM

## 2021-08-15 NOTE — Telephone Encounter (Signed)
He wants her to have the bone density done first, depending on that will depend if he can do her surgery or not.

## 2021-08-16 NOTE — Telephone Encounter (Signed)
I called and advised that the order has been placed for the bone density test and that [facility] would be calling her to set it up once they have gotten any auth's that are needed, she asked if I knew how long it would be till they called her, I advised that I did not know, as they do get referrals from many offices for these and they usually schedule in the order they get them. She asked how soon after the bone density test she could have surgery, I advised that it would depend on the bone density test if she could even have surgery, then it would depend on Dr. Otho Ket schedule as to when that would be and this would not be known till the time comes for her to be scheduled. She states that she understands all of this

## 2021-08-16 NOTE — Telephone Encounter (Signed)
Pt called again asking about someone to get her scheduled for her bone density scan.   CB [phone]

## 2021-08-21 ENCOUNTER — Encounter: Payer: Self-pay | Admitting: Orthopaedic Surgery

## 2021-08-21 ENCOUNTER — Other Ambulatory Visit: Payer: Self-pay | Admitting: Physician Assistant

## 2021-08-21 ENCOUNTER — Other Ambulatory Visit: Payer: Self-pay

## 2021-08-21 ENCOUNTER — Telehealth: Payer: Self-pay | Admitting: Specialist

## 2021-08-21 ENCOUNTER — Other Ambulatory Visit: Payer: Self-pay | Admitting: Specialist

## 2021-08-21 ENCOUNTER — Ambulatory Visit (INDEPENDENT_AMBULATORY_CARE_PROVIDER_SITE_OTHER): Payer: Medicare Other

## 2021-08-21 ENCOUNTER — Ambulatory Visit (INDEPENDENT_AMBULATORY_CARE_PROVIDER_SITE_OTHER): Payer: Medicare Other | Admitting: Orthopaedic Surgery

## 2021-08-21 DIAGNOSIS — M25561 Pain in right knee: Secondary | ICD-10-CM | POA: Diagnosis not present

## 2021-08-21 DIAGNOSIS — G8929 Other chronic pain: Secondary | ICD-10-CM | POA: Diagnosis not present

## 2021-08-21 MED ORDER — HYDROCODONE-ACETAMINOPHEN 5-325 MG PO TABS: 1.0000 | ORAL_TABLET | Freq: Every day | ORAL | 0 refills | Status: DC | PRN

## 2021-08-21 MED ORDER — PREDNISONE 10 MG (21) PO TBPK: ORAL_TABLET | ORAL | 0 refills | Status: DC

## 2021-08-21 NOTE — Telephone Encounter (Signed)
Pt would like refill on hydrocodone.   CB [phone]

## 2021-08-21 NOTE — Addendum Note (Signed)
Addended by: Minda Ditto, Alyse Low N on: 08/21/2021 11:28 AM   Modules accepted: Orders

## 2021-08-21 NOTE — Telephone Encounter (Signed)
Pt called requesting a new place to get her bone density done. Pt states [facility] can not schedule her until April. Please call pt about this matter at  [phone].

## 2021-08-21 NOTE — Progress Notes (Signed)
Post-Op Visit Note   Patient: Kristin Gilmore           Date of Birth: Apr 20, 1951           MRN: [id number] Visit Date: 08/21/2021 PCP: Aletha Halim., PA-C   Assessment & Plan:  Chief Complaint:  Chief Complaint  Patient presents with   Right Knee - Pain, Routine Post Op, Follow-up   Visit Diagnoses:  1. Chronic pain of right knee     Plan: Dynver is 2 months status post right total knee replacement.  Overall doing well.  Progressing with physical therapy quite well.  Right knee shows fully healed surgical scar.  She is achieving excellent flexion to greater than 105 but she still lacks about 10 to 15 degrees of full extension.  Her x-rays show stable total knee replacement.  I encouraged her to work harder on getting the knee extension better.  Her flexion is doing quite well and I think this will continue to progress.  I refilled her Norco today.  Dental prophylaxis reinforced.  Recheck in 6 weeks.  Follow-Up Instructions: Return in about 6 weeks (around 10/02/2021).   Orders:  Orders Placed This Encounter  Procedures   XR Knee 1-2 Views Right   Meds ordered this encounter  Medications   HYDROcodone-acetaminophen (NORCO) 5-325 MG tablet    Sig: Take 1-2 tablets by mouth daily as needed.    Dispense:  30 tablet    Refill:  0    Imaging: XR Knee 1-2 Views Right  Result Date: 08/21/2021 Stable total knee replacement in good alignment    PMFS History: Patient Active Problem List   Diagnosis Date Noted   Status post total right knee replacement 06/26/2021   Primary osteoarthritis of right knee 06/25/2021   Parkinson's disease ([place]) 01/24/2021   Trochanteric bursitis, left hip 01/18/2020   Lumbar spinal stenosis 12/19/2017   Pain in joint, ankle and foot 05/02/2016   Chronic venous insufficiency 05/02/2016   Peripheral edema 11/01/2015   Intractable chronic migraine without aura 11/02/2014   Tremor 04/13/2014   Migraine without aura 04/13/2014    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   Past Medical History:  Diagnosis Date   Arthritis    Depression    Dyslipidemia    Hypertension    Migraine    Migraine without aura, without mention of intractable migraine without mention of status migrainosus 04/13/2014   Parkinson's disease ([place]) 01/24/2021   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   Pre-diabetes    "i was told i was pre-diabetic a while ago"   Tremor 04/13/2014    Family History  Problem Relation Age of Onset   Cancer Father        stomach cancer   Migraines Mother     Past Surgical History:  Procedure Laterality Date   CHOLECYSTECTOMY     COLONOSCOPY W/ POLYPECTOMY     LUMBAR LAMINECTOMY  07/2020   3 level decompression - [place], [place]   LUMBAR LAMINECTOMY/DECOMPRESSION MICRODISCECTOMY N/A 12/19/2017   Procedure: LAMINECTOMY LUMBAR TWO- LUMBAR THREE, LUMBAR THREE- LUMBAR FOUR, LUMBAR FOUR- LUMBAR FIVE ;  Surgeon: Consuella Lose, MD;  Location: [facility];  Service: Neurosurgery;  Laterality: N/A;   NASAL SINUS SURGERY     TONSILLECTOMY     TOTAL KNEE ARTHROPLASTY Right 06/26/2021   Procedure: RIGHT TOTAL KNEE ARTHROPLASTY;  Surgeon: Leandrew Koyanagi, MD;  Location: [facility];  Service: Orthopedics;  Laterality: Right;   [place] EXTRACTION     Social  History   Occupational History   Occupation: Retired  Tobacco Use   Smoking status: Never   Smokeless tobacco: Never  Vaping Use   Vaping Use: Never used  Substance and Sexual Activity   Alcohol use: Yes    Comment: occasional glass of wine   Drug use: No   Sexual activity: Not on file

## 2021-08-22 ENCOUNTER — Telehealth: Payer: Self-pay | Admitting: Neurology

## 2021-08-22 ENCOUNTER — Other Ambulatory Visit: Payer: Self-pay | Admitting: Specialist

## 2021-08-22 NOTE — Telephone Encounter (Signed)
Pt is scheduled at [facility] on Dec 1 at 2pm at [place]. Order has been faxed to [phone]

## 2021-08-22 NOTE — Telephone Encounter (Signed)
I called Kristin Gilmore with [facility] to see if they can do a Dexa and they can possibly do one today, I called pt left vm with pt to return my call to see if she is ok with going to [facility] before I call to schedule. Pending call back from pt.

## 2021-08-22 NOTE — Telephone Encounter (Signed)
[facility]) called, requesting patient portion of the application for Erenumab-aooe (AIMOVIG) 140 MG/ML SOAJ.   Can fax to [phone]

## 2021-08-31 ENCOUNTER — Telehealth: Payer: Self-pay | Admitting: Specialist

## 2021-08-31 NOTE — Telephone Encounter (Signed)
I called and spoke with patient, states that her back is worsening, Kristin Gilmore gave her dose pak and it did not help, says that her pain is in her right hip, buttock, and on the outside of the right leg. Says that the pain is so severe that she crys at night. Please advise

## 2021-08-31 NOTE — Telephone Encounter (Signed)
Pt called stating her sciatica has her in a lot of pain and she would like a CB to discuss her pain levels.   [phone]

## 2021-09-03 ENCOUNTER — Telehealth: Payer: Self-pay | Admitting: Neurology

## 2021-09-03 NOTE — Telephone Encounter (Signed)
Spoke with patient.  She states this morning she got dizzy and then treated herself with the Sumatriptan like it was a migraine.  She states she has heard this was normal with Parkinson's.  I did advise that with this new dizziness we recommend that she would be evaluated by urgent care or ER if severe, especially if this was delayed by other symptoms such as weakness, numbness etc. She verbalized understanding. She states the dizziness is actually better now and she has other questions/concerns to address. Pt wanted to report an update that over the last 4 months she has lost over 60 pounds.  She is being evaluated by GI as well as primary care.  She states she had an endoscopy and colonoscopy and they were okay.  She states she has a yeast infection in her throat and is picking up a prescription for it.  She wanted to ask if Dr. Jaynee Eagles would be willing to refill her Amitriptyline early as she cannot fill until 09/18/21.  She takes 30 mg at bedtime.  She states that once in a blue moon she has taken an extra dose in the afternoon.  I did advise her against self dosing.  She also reports that she takes Seroquel 150 mg (half of a 300 mg) but this is prescribed by Dr. [name].  She asked if Dr. Jaynee Eagles will be willing to take this over.  I advised likely she will not as this is already being managed by another doctor and also this is not something Dr. Jannifer Franklin was already prescribing.  I encouraged her to continue speaking about this medication management with Dr. [name]. Also encouraged her again that if she has dizziness to have this evaluated by urgent care/ER.

## 2021-09-03 NOTE — Telephone Encounter (Signed)
Pt states she has had a 2 day headache. She took 50 mg of SUMAtriptan (IMITREX) 100 MG tablet  and 2 hours later took 100 mg of the  SUMAtriptan (IMITREX) 100 MG tablet nd is now very dizzy.  Pt is asking for a call to discuss.

## 2021-09-04 ENCOUNTER — Other Ambulatory Visit: Payer: Self-pay | Admitting: Neurology

## 2021-09-04 MED ORDER — AMITRIPTYLINE HCL 10 MG PO TABS: 30.0000 mg | ORAL_TABLET | Freq: Every day | ORAL | 1 refills | Status: DC

## 2021-09-05 NOTE — Telephone Encounter (Signed)
Called and spoke with pt and r/s her to see Dr. Leta Baptist per Dr. Jaynee Eagles (see previous notes). She was scheduled for 01/30 at 4:00 pm and placed on the cancellation list. Pt has requested that we only contact her by phone and do not send her mychart messages as she does not see them.

## 2021-09-06 ENCOUNTER — Encounter: Payer: Self-pay | Admitting: Specialist

## 2021-09-06 ENCOUNTER — Other Ambulatory Visit: Payer: Self-pay

## 2021-09-06 ENCOUNTER — Ambulatory Visit (INDEPENDENT_AMBULATORY_CARE_PROVIDER_SITE_OTHER): Payer: Medicare Other | Admitting: Specialist

## 2021-09-06 VITALS — BP 145/96 | HR 72 | Ht 67.5 in | Wt 155.0 lb

## 2021-09-06 DIAGNOSIS — M4726 Other spondylosis with radiculopathy, lumbar region: Secondary | ICD-10-CM

## 2021-09-06 DIAGNOSIS — M5416 Radiculopathy, lumbar region: Secondary | ICD-10-CM

## 2021-09-06 DIAGNOSIS — T8482XA Fibrosis due to internal orthopedic prosthetic devices, implants and grafts, initial encounter: Secondary | ICD-10-CM

## 2021-09-06 DIAGNOSIS — M4316 Spondylolisthesis, lumbar region: Secondary | ICD-10-CM | POA: Diagnosis not present

## 2021-09-06 DIAGNOSIS — M5136 Other intervertebral disc degeneration, lumbar region: Secondary | ICD-10-CM

## 2021-09-06 MED ORDER — OXYCODONE-ACETAMINOPHEN 5-325 MG PO TABS: 1.0000 | ORAL_TABLET | Freq: Three times a day (TID) | ORAL | 0 refills | Status: DC | PRN

## 2021-09-06 NOTE — Progress Notes (Signed)
Office Visit Note   Patient: Kristin Gilmore           Date of Birth: Feb 01, 1951           MRN: [id number] Visit Date: 09/06/2021              Requested by: Aletha Halim., PA-C [address]  [place] [address] PCP: Aletha Halim., PA-C   Assessment & Plan: Visit Diagnoses:  1. Spondylolisthesis, lumbar region   2. DDD (degenerative disc disease), lumbar   3. Other spondylosis with radiculopathy, lumbar region   4. Radiculopathy, lumbar region   5. Arthrofibrosis of total knee arthroplasty, initial encounter ([place])     Plan: Avoid bending, stooping and avoid lifting weights greater than 10 lbs. Avoid prolong standing and walking. Order for a new walker with wheels. Surgery scheduling secretary Kandice Hams, will call you in the next week to schedule for surgery.  Surgery recommended is a four level lumbar fusion L2-3, L3-4, L4-5 and  L5-S1 this would be done with rods, screws and cages with local bone graft and allograft (donor bone graft). Take oxycodone for for pain. Risk of surgery includes risk of infection 1 in 200 patients, bleeding 1/2% chance you would need a transfusion.   Risk to the nerves is one in 10,000. You will need to use a brace for 3 months and wean from the brace on the 4th month. Expect improved walking and standing tolerance. Expect relief of leg pain but numbness may persist depending on the length and degree of pressure that has been present.  A bone density test is recommend prior to any surgical fusion surgery in order to discern if   Orders Placed This Encounter  Procedures   Ambulatory referral to Physical Therapy    Meds ordered this encounter  Medications   oxyCODONE-acetaminophen (PERCOCET) 5-325 MG tablet    Sig: Take 1 tablet by mouth every 8 (eight) hours as needed.    Dispense:  40 tablet    Refill:  0       Procedures: No procedures performed   Clinical Data: No additional  findings.   Subjective: Chief Complaint  Patient presents with   Right Knee - Follow-up    HPI  Review of Systems  Constitutional: Negative.   HENT: Negative.    Eyes: Negative.   Respiratory: Negative.    Cardiovascular: Negative.   Gastrointestinal: Negative.   Endocrine: Negative.   Genitourinary: Negative.   Musculoskeletal: Negative.   Skin: Negative.   Allergic/Immunologic: Negative.   Neurological: Negative.   Hematological: Negative.   Psychiatric/Behavioral: Negative.      Objective: Vital Signs: BP (!) 145/96   Pulse 72   Ht 5' 7.5" (1.715 m)   Wt 155 lb (70.3 kg)   BMI 23.92 kg/m   Physical Exam Constitutional:      Appearance: She is well-developed.  HENT:     Head: Normocephalic and atraumatic.  Eyes:     Pupils: Pupils are equal, round, and reactive to light.  Pulmonary:     Effort: Pulmonary effort is normal.     Breath sounds: Normal breath sounds.  Abdominal:     General: Bowel sounds are normal.     Palpations: Abdomen is soft.  Musculoskeletal:     Cervical back: Normal range of motion and neck supple.     Lumbar back: Negative right straight leg raise test and negative left straight leg raise test.     Right  knee:     Instability Tests: Medial McMurray test negative and lateral McMurray test negative.  Skin:    General: Skin is warm and dry.  Neurological:     Mental Status: She is alert and oriented to person, place, and time.  Psychiatric:        Behavior: Behavior normal.        Thought Content: Thought content normal.        Judgment: Judgment normal.    Right Knee Exam   Range of Motion  Extension:  -10 abnormal  Flexion:  140   Tests  McMurray:  Medial - negative Lateral - negative Varus: positive Valgus: positive Lachman:  Anterior - negative    Posterior - negative   Back Exam   Tenderness  The patient is experiencing tenderness in the lumbar.  Range of Motion  Extension:  abnormal  Flexion:  abnormal   Lateral bend right:  abnormal  Lateral bend left:  abnormal  Rotation right:  abnormal  Rotation left:  abnormal   Muscle Strength  Right Quadriceps:  4/5  Left Quadriceps:  5/5  Right Hamstrings:  5/5  Left Hamstrings:  5/5   Tests  Straight leg raise right: negative Straight leg raise left: negative  Reflexes  Babinski's sign: abnormal   Other  Toe walk: abnormal Heel walk: abnormal Sensation: decreased Gait: antalgic  Erythema: no back redness Scars: present  Comments:  Right knee with weaknes 4+/5 right foot DF 5-/5     Specialty Comments:  No specialty comments available.  Imaging: No results found.   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   Past Medical History:  Diagnosis Date   Arthritis    Depression    Dyslipidemia    Hypertension    Migraine    Migraine without aura, without mention of intractable migraine without mention of status migrainosus 04/13/2014   Parkinson's disease ([place]) 01/24/2021   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   Pre-diabetes    "i was told i was pre-diabetic a while ago"   Tremor 04/13/2014    Family History  Problem Relation Age of Onset   Cancer Father        stomach cancer   Migraines Mother     Past Surgical History:  Procedure Laterality Date   CHOLECYSTECTOMY     COLONOSCOPY W/ POLYPECTOMY     LUMBAR LAMINECTOMY  07/2020   3 level decompression - [place], [place]   LUMBAR LAMINECTOMY/DECOMPRESSION  MICRODISCECTOMY N/A 12/19/2017   Procedure: LAMINECTOMY LUMBAR TWO- LUMBAR THREE, LUMBAR THREE- LUMBAR FOUR, LUMBAR FOUR- LUMBAR FIVE ;  Surgeon: Consuella Lose, MD;  Location: [facility];  Service: Neurosurgery;  Laterality: N/A;   NASAL SINUS SURGERY     TONSILLECTOMY     TOTAL KNEE ARTHROPLASTY Right 06/26/2021   Procedure: RIGHT TOTAL KNEE ARTHROPLASTY;  Surgeon: Leandrew Koyanagi, MD;  Location: [facility];  Service: Orthopedics;  Laterality: Right;   WISDOM TOOTH EXTRACTION     Social History   Occupational History  Occupation: Retired  Tobacco Use   Smoking status: Never   Smokeless tobacco: Never  Vaping Use   Vaping Use: Never used  Substance and Sexual Activity   Alcohol use: Yes    Comment: occasional glass of wine   Drug use: No   Sexual activity: Not on file

## 2021-09-06 NOTE — Patient Instructions (Signed)
Plan: Avoid bending, stooping and avoid lifting weights greater than 10 lbs. Avoid prolong standing and walking. Order for a new walker with wheels. Surgery scheduling secretary Kandice Hams, will call you in the next week to schedule for surgery.  Surgery recommended is a four level lumbar fusion L2-3, L3-4, L4-5 and  L5-S1 this would be done with rods, screws and cages with local bone graft and allograft (donor bone graft). Take oxycodone for for pain. Risk of surgery includes risk of infection 1 in 200 patients, bleeding 1/2% chance you would need a transfusion.   Risk to the nerves is one in 10,000. You will need to use a brace for 3 months and wean from the brace on the 4th month. Expect improved walking and standing tolerance. Expect relief of leg pain but numbness may persist depending on the length and degree of pressure that has been present. Bone density is showing normal bone strength.  You desire a second opinion and this will be done prior to scheduling your surgery.

## 2021-09-08 ENCOUNTER — Other Ambulatory Visit: Payer: Self-pay | Admitting: Psychiatry

## 2021-09-08 DIAGNOSIS — F3171 Bipolar disorder, in partial remission, most recent episode hypomanic: Secondary | ICD-10-CM

## 2021-09-10 NOTE — Telephone Encounter (Signed)
Please schedule appt

## 2021-09-10 NOTE — Telephone Encounter (Signed)
Last seen 05-23-21

## 2021-09-11 NOTE — Telephone Encounter (Signed)
She is notorious for not making or keeping appts.  Please persist in calling her.  I won't renew RX until she schedules appt

## 2021-09-12 NOTE — Telephone Encounter (Signed)
Pt has made an appt for 02/09. Please send in refill

## 2021-09-18 ENCOUNTER — Other Ambulatory Visit: Payer: Self-pay | Admitting: Neurology

## 2021-09-18 ENCOUNTER — Telehealth: Payer: Self-pay | Admitting: Specialist

## 2021-09-18 NOTE — Telephone Encounter (Signed)
Pt called and would like to know the results of her bone density test?   CB [phone]

## 2021-09-19 NOTE — Telephone Encounter (Signed)
Per JN it is normal

## 2021-09-20 ENCOUNTER — Telehealth: Payer: Self-pay | Admitting: Specialist

## 2021-09-20 NOTE — Telephone Encounter (Signed)
Received vm from pt asking for date she first saw Dr. Louanne Skye. IC, spoke with patient, she stated she wasn't feeling well and is having chest pain. I advised her to call 911 and she said she "took a baby aspirin and will be fine". She asked me call back and leave info on machine. I called pt, phone rang na, machine did not come on, after ringing a while, fast busy . Unable to leave msg.

## 2021-09-20 NOTE — Telephone Encounter (Signed)
Pt called and states she is having a sharp pain down her back through her right hip down her right leg. She would like to know what to do? Louanne Skye was not available until 01/19. She would like to be put on the wait list.   CB [phone]

## 2021-09-20 NOTE — Telephone Encounter (Signed)
Patient advise that her bone density is normal

## 2021-09-21 NOTE — Telephone Encounter (Signed)
I called and advised her to try heat for 20 min increments and to take her pain meds as needed. She states that she has a 2nd opinion scheduled for 09/27/21

## 2021-09-25 ENCOUNTER — Telehealth: Payer: Self-pay | Admitting: Neurology

## 2021-09-25 ENCOUNTER — Ambulatory Visit: Payer: Medicare Other

## 2021-09-25 NOTE — Telephone Encounter (Signed)
Pt requesting refill for amitriptyline (ELAVIL) 10 MG tablet pharmacy [id number] - [place], [place]. Pt also asking for a larger quantity.

## 2021-09-25 NOTE — Telephone Encounter (Signed)
Called patient to advise her Dr Jaynee Eagles sent in a new Rx on 09/04/21 x 6 month supply. She read her bottle, stated she got #30 tabs, filled on 09/14/21, #Rx [id number]. She is taking 3 tabs a night so only has 10 day supply.  I advised [facility]'ll [facility] and let [facility] know. She asked that I leave a VM. Patient verbalized understanding, appreciation. Called [facility] [address], put through to VM x 2.will call back later.

## 2021-09-25 NOTE — Telephone Encounter (Signed)
Called pharmacy, spoke with Roe who stated she can fill elavil for #270 tablets. She was uncertain as to why patient only got 30 tablets.  Called patient, LVM informing her of above.

## 2021-09-26 ENCOUNTER — Other Ambulatory Visit: Payer: Self-pay

## 2021-09-26 ENCOUNTER — Ambulatory Visit (INDEPENDENT_AMBULATORY_CARE_PROVIDER_SITE_OTHER): Payer: Medicare Other | Admitting: Specialist

## 2021-09-26 ENCOUNTER — Encounter: Payer: Self-pay | Admitting: Specialist

## 2021-09-26 VITALS — BP 0/0 | HR 0 | Ht 67.5 in | Wt 147.0 lb

## 2021-09-26 DIAGNOSIS — M4156 Other secondary scoliosis, lumbar region: Secondary | ICD-10-CM | POA: Diagnosis not present

## 2021-09-26 DIAGNOSIS — M48062 Spinal stenosis, lumbar region with neurogenic claudication: Secondary | ICD-10-CM | POA: Diagnosis not present

## 2021-09-26 DIAGNOSIS — M4316 Spondylolisthesis, lumbar region: Secondary | ICD-10-CM

## 2021-09-26 DIAGNOSIS — M5416 Radiculopathy, lumbar region: Secondary | ICD-10-CM | POA: Diagnosis not present

## 2021-09-26 DIAGNOSIS — M4726 Other spondylosis with radiculopathy, lumbar region: Secondary | ICD-10-CM

## 2021-09-26 MED ORDER — OXYCODONE-ACETAMINOPHEN 5-325 MG PO TABS: 1.0000 | ORAL_TABLET | ORAL | 0 refills | Status: DC | PRN

## 2021-09-26 MED ORDER — MORPHINE SULFATE ER 15 MG PO TBCR: 15.0000 mg | EXTENDED_RELEASE_TABLET | Freq: Two times a day (BID) | ORAL | 0 refills | Status: DC

## 2021-09-26 NOTE — Patient Instructions (Addendum)
Plan:  Avoid bending, stooping and avoid lifting weights greater than 10 lbs. Avoid prolong standing and walking. Order for a new walker with wheels. Surgery scheduling secretary Kandice Hams, will call you in the next week to schedule for surgery.  Surgery recommended is a 2 level lumbar fusion  L3-4 and L4-5 this would be done with rods, screws and cages with local bone graft and allograft (donor bone graft). Take oxycodone for for pain. Add an every 12 hour MS Contin 15 mg every 12 hours and use the oxycodone every 4 hours for breakthrough pain.  Risk of surgery includes risk of infection 1 in 200 patients, bleeding 1/2% chance you would need a transfusion.   Risk to the nerves is one in 10,000. You will need to use a brace for 3 months and wean from the brace on the 4th month. Expect improved walking and standing tolerance. Expect relief of leg pain but numbness may persist depending on the length and degree of pressure that has been present. Second opinion is scheduled for tomorrow. We will schedule as soon as this is done. Avoid bending, stooping and avoid lifting weights greater than 10 lbs. Avoid prolong standing and walking. Avoid frequent bending and stooping  No lifting greater than 10 lbs. May use ice or moist heat for pain. Weight loss is of benefit. Handicap license is approved. [facility] will call to arrange for epidural steroid injection right L3 and L4.

## 2021-09-26 NOTE — Progress Notes (Signed)
Office Visit Note   Patient: Kristin Gilmore           Date of Birth: [birth year]           MRN: [id number] Visit Date: 09/26/2021              Requested by: Aletha Halim., PA-C [address]  [place] [address] PCP: Aletha Halim., PA-C   Assessment & Plan: Visit Diagnoses:  1. Spondylolisthesis, lumbar region   2. Radiculopathy, lumbar region   3. Spinal stenosis of lumbar region with neurogenic claudication   4. Other secondary scoliosis, lumbar region   5. Other spondylosis with radiculopathy, lumbar region     Plan:  Avoid bending, stooping and avoid lifting weights greater than 10 lbs. Avoid prolong standing and walking. Order for a new walker with wheels. Surgery scheduling secretary Kandice Hams, will call you in the next week to schedule for surgery.  Surgery recommended is a 2 level lumbar fusion  L3-4 and L4-5 this would be done with rods, screws and cages with local bone graft and allograft (donor bone graft). Take oxycodone for for pain. Add an every 12 hour MS Contin 15 mg every 12 hours and use the oxycodone every 4 hours for breakthrough pain.  Risk of surgery includes risk of infection 1 in 200 patients, bleeding 1/2% chance you would need a transfusion.   Risk to the nerves is one in 10,000. You will need to use a brace for 3 months and wean from the brace on the 4th month. Expect improved walking and standing tolerance. Expect relief of leg pain but numbness may persist depending on the length and degree of pressure that has been present. Second opinion is scheduled for tomorrow. We will schedule as soon as this is done. Avoid bending, stooping and avoid lifting weights greater than 10 lbs. Avoid prolong standing and walking. Avoid frequent bending and stooping  No lifting greater than 10 lbs. May use ice or moist heat for pain. Weight loss is of benefit. Handicap license is approved. [facility] will call to arrange for  epidural steroid injection right L3 and L4. Follow-Up Instructions: No follow-ups on file.   Orders:  Orders Placed This Encounter  Procedures   Ambulatory referral to Interventional Radiology   Meds ordered this encounter  Medications   morphine (MS CONTIN) 15 MG 12 hr tablet    Sig: Take 1 tablet (15 mg total) by mouth every 12 (twelve) hours.    Dispense:  14 tablet    Refill:  0   oxyCODONE-acetaminophen (PERCOCET/ROXICET) 5-325 MG tablet    Sig: Take 1 tablet by mouth every 4 (four) hours as needed for severe pain (breakthrough pain).    Dispense:  15 tablet    Refill:  0      Procedures: No procedures performed   Clinical Data: No additional findings.   Subjective: Chief Complaint  Patient presents with   Lower Back - Pain    Patient could not tolerate her BP being taken   Right Leg - Pain    HPI  Review of Systems   Objective: Vital Signs: BP (!) 0/0    Pulse (!) 0    Ht 5' 7.5" (1.715 m)    Wt 147 lb (66.7 kg)    BMI 22.68 kg/m   Physical Exam  Ortho Exam  Specialty Comments:  No specialty comments available.  Imaging: No results found.   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   Past Medical History:  Diagnosis Date   Arthritis    Depression    Dyslipidemia    Hypertension    Migraine    Migraine without aura, without mention of intractable migraine without mention of status migrainosus 04/13/2014   Parkinson's disease  ([place]) 01/24/2021   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   Pre-diabetes    "i was told i was pre-diabetic a while ago"   Tremor 04/13/2014    Family History  Problem Relation Age of Onset   Cancer Father        stomach cancer   Migraines Mother     Past Surgical History:  Procedure Laterality Date   CHOLECYSTECTOMY     COLONOSCOPY W/ POLYPECTOMY     LUMBAR LAMINECTOMY  07/2020   3 level decompression - [place], [place]   LUMBAR LAMINECTOMY/DECOMPRESSION MICRODISCECTOMY N/A 12/19/2017   Procedure: LAMINECTOMY LUMBAR TWO- LUMBAR THREE, LUMBAR THREE- LUMBAR FOUR, LUMBAR FOUR- LUMBAR FIVE ;  Surgeon: Consuella Lose, MD;  Location: [facility];  Service: Neurosurgery;  Laterality: N/A;   NASAL SINUS SURGERY     TONSILLECTOMY     TOTAL KNEE ARTHROPLASTY Right 06/26/2021   Procedure: RIGHT TOTAL KNEE ARTHROPLASTY;  Surgeon: Leandrew Koyanagi, MD;  Location: [facility];  Service: Orthopedics;  Laterality: Right;   WISDOM TOOTH EXTRACTION     Social History   Occupational History   Occupation: Retired  Tobacco Use   Smoking status: Never   Smokeless tobacco: Never  Vaping Use   Vaping Use: Never used  Substance and Sexual Activity   Alcohol use: Yes    Comment: occasional glass of wine   Drug use: No   Sexual activity: Not on file

## 2021-09-27 ENCOUNTER — Other Ambulatory Visit: Payer: Self-pay | Admitting: Specialist

## 2021-09-27 DIAGNOSIS — M4726 Other spondylosis with radiculopathy, lumbar region: Secondary | ICD-10-CM

## 2021-09-27 DIAGNOSIS — M48062 Spinal stenosis, lumbar region with neurogenic claudication: Secondary | ICD-10-CM

## 2021-09-27 DIAGNOSIS — M4316 Spondylolisthesis, lumbar region: Secondary | ICD-10-CM

## 2021-09-27 DIAGNOSIS — M4156 Other secondary scoliosis, lumbar region: Secondary | ICD-10-CM

## 2021-09-27 DIAGNOSIS — M5416 Radiculopathy, lumbar region: Secondary | ICD-10-CM

## 2021-10-02 ENCOUNTER — Other Ambulatory Visit: Payer: Self-pay | Admitting: Specialist

## 2021-10-02 MED ORDER — MORPHINE SULFATE ER 15 MG PO TBCR: 15.0000 mg | EXTENDED_RELEASE_TABLET | Freq: Two times a day (BID) | ORAL | 0 refills | Status: DC

## 2021-10-02 NOTE — Telephone Encounter (Signed)
Pt would like refill on morphine (MS CONTIN) 15 MG 12 hr tablet and oxycodone.

## 2021-10-03 ENCOUNTER — Telehealth: Payer: Self-pay

## 2021-10-03 ENCOUNTER — Other Ambulatory Visit: Payer: Self-pay | Admitting: Specialist

## 2021-10-03 MED ORDER — OXYCODONE-ACETAMINOPHEN 5-325 MG PO TABS: 1.0000 | ORAL_TABLET | Freq: Three times a day (TID) | ORAL | 0 refills | Status: DC | PRN

## 2021-10-03 NOTE — Telephone Encounter (Signed)
Pt called asking for a refill of her oxycodone rx, she state's she has four left but would like to pick this rx up when she goes to pick up her morphine rx. Pt would like a CB as soon as this has been called in please.   [phone]

## 2021-10-03 NOTE — Telephone Encounter (Signed)
Patient has decided she wants Dr. Louanne Skye to do her surgery.  She already went for 2nd opinion.  Need surgery sheet

## 2021-10-03 NOTE — Telephone Encounter (Signed)
I called and advised that he approved her request

## 2021-10-09 ENCOUNTER — Ambulatory Visit: Payer: Medicare Other | Admitting: Orthopaedic Surgery

## 2021-10-09 ENCOUNTER — Other Ambulatory Visit: Payer: Self-pay | Admitting: Specialist

## 2021-10-09 MED ORDER — MORPHINE SULFATE ER 15 MG PO TBCR: 15.0000 mg | EXTENDED_RELEASE_TABLET | Freq: Two times a day (BID) | ORAL | 0 refills | Status: DC

## 2021-10-09 NOTE — Telephone Encounter (Signed)
Pt called requesting a refill of morphine 15 mg. Please send to pharmacy on file. Pt is asking for a call when medication has been sent in. Pt phone number is [phone].

## 2021-10-10 ENCOUNTER — Ambulatory Visit: Payer: Medicare Other | Admitting: Neurology

## 2021-10-11 ENCOUNTER — Other Ambulatory Visit: Payer: Self-pay | Admitting: Specialist

## 2021-10-11 ENCOUNTER — Other Ambulatory Visit: Payer: Self-pay

## 2021-10-11 ENCOUNTER — Ambulatory Visit
Admission: RE | Admit: 2021-10-11 | Discharge: 2021-10-11 | Disposition: A | Discharge status: Home / Self Care | Payer: Medicare Other | Source: Ambulatory Visit | Attending: Specialist | Admitting: Specialist

## 2021-10-11 DIAGNOSIS — M4156 Other secondary scoliosis, lumbar region: Secondary | ICD-10-CM

## 2021-10-11 DIAGNOSIS — M4316 Spondylolisthesis, lumbar region: Secondary | ICD-10-CM

## 2021-10-11 DIAGNOSIS — M4726 Other spondylosis with radiculopathy, lumbar region: Secondary | ICD-10-CM

## 2021-10-11 DIAGNOSIS — M5416 Radiculopathy, lumbar region: Secondary | ICD-10-CM

## 2021-10-11 DIAGNOSIS — M48062 Spinal stenosis, lumbar region with neurogenic claudication: Secondary | ICD-10-CM

## 2021-10-11 IMAGING — XA DG EPIDURAL NERVE ROOT
4 series · 4 of 4 positions shown · non-contrast
Comparison: none

CLINICAL DATA: Lumbosacral spondylosis without myelopathy. Right
leg and groin pain. Advanced multilevel neural foraminal stenosis.
Right-sided epidural injections requested at L3 and L4.

[Series 1: ortho adipose · 1 of 1 slices shown (1 of 4)]
[im 1/1]
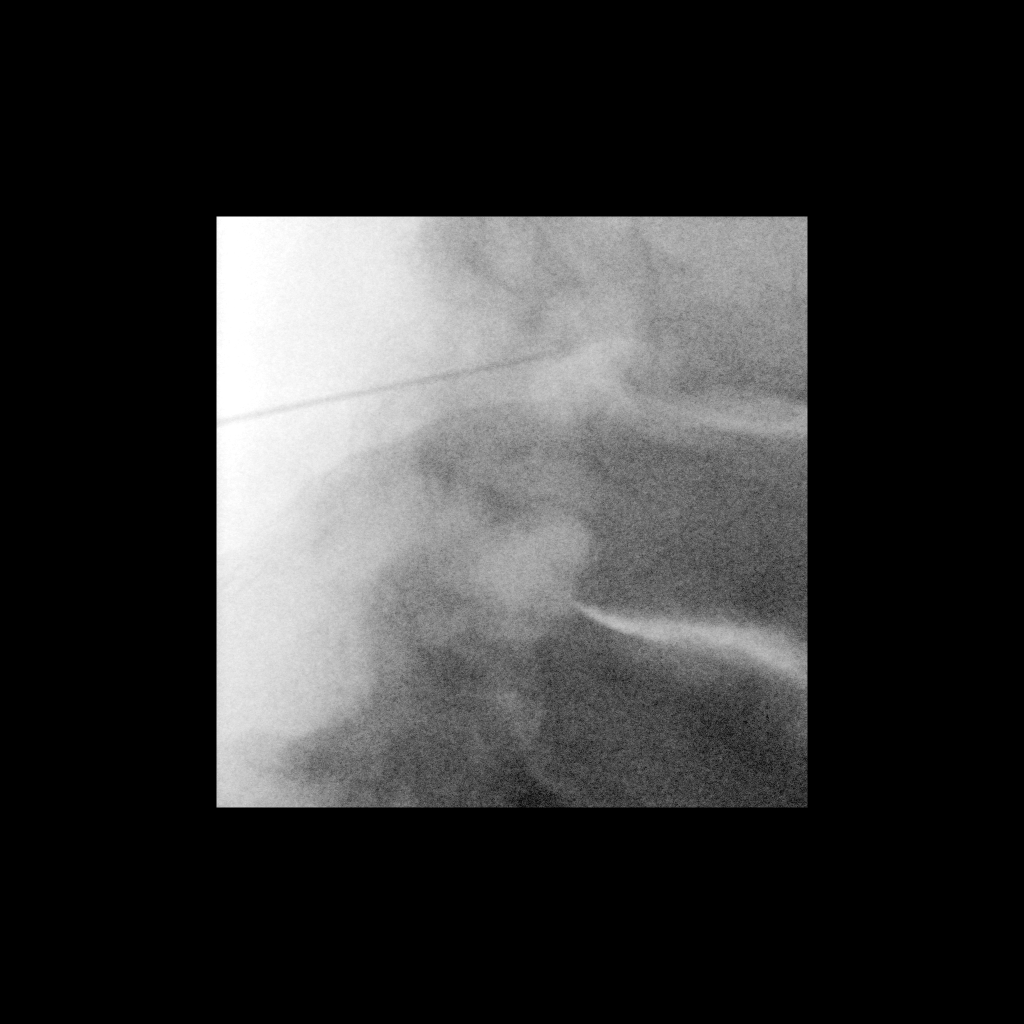

[Series 2: ortho adipose · 1 of 1 slices shown (2 of 4)]
[im 1/1]
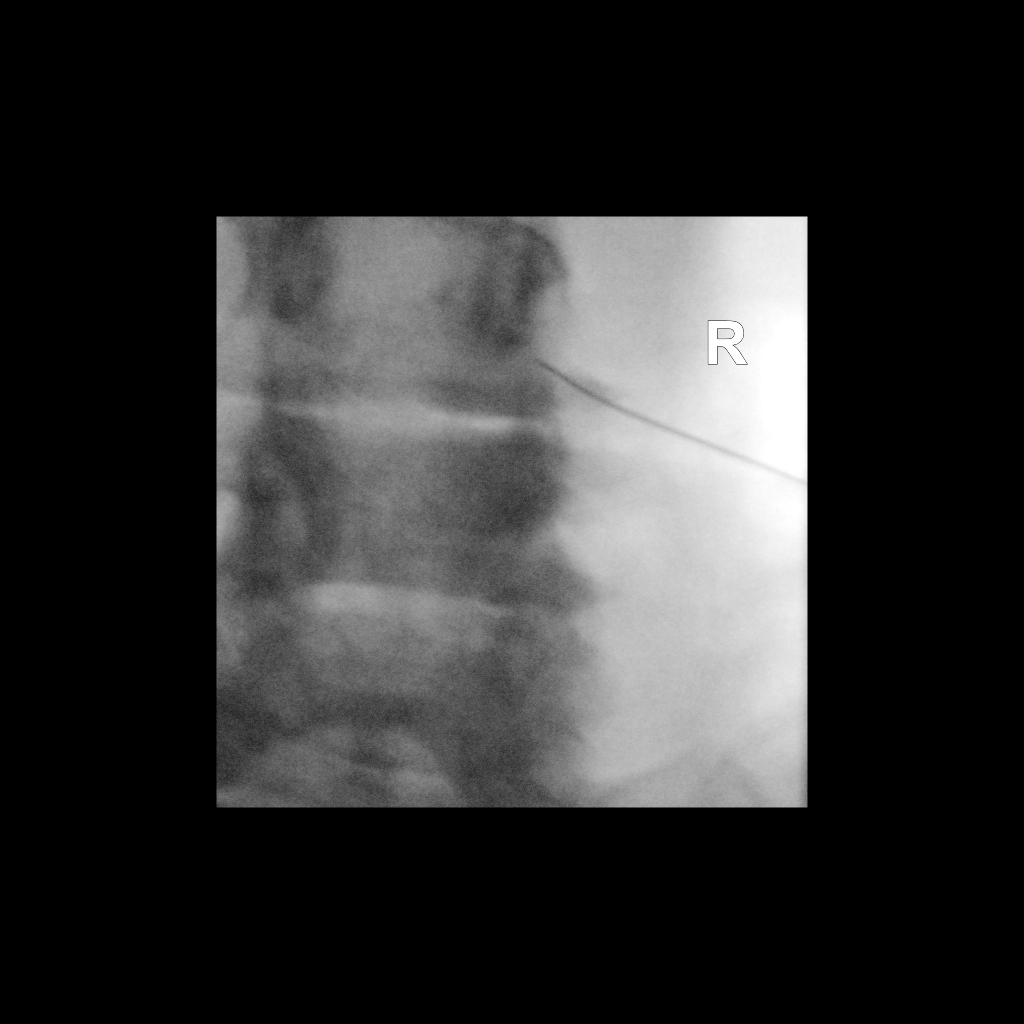

[Series 3: ortho adipose · 1 of 1 slices shown (3 of 4)]
[im 1/1]
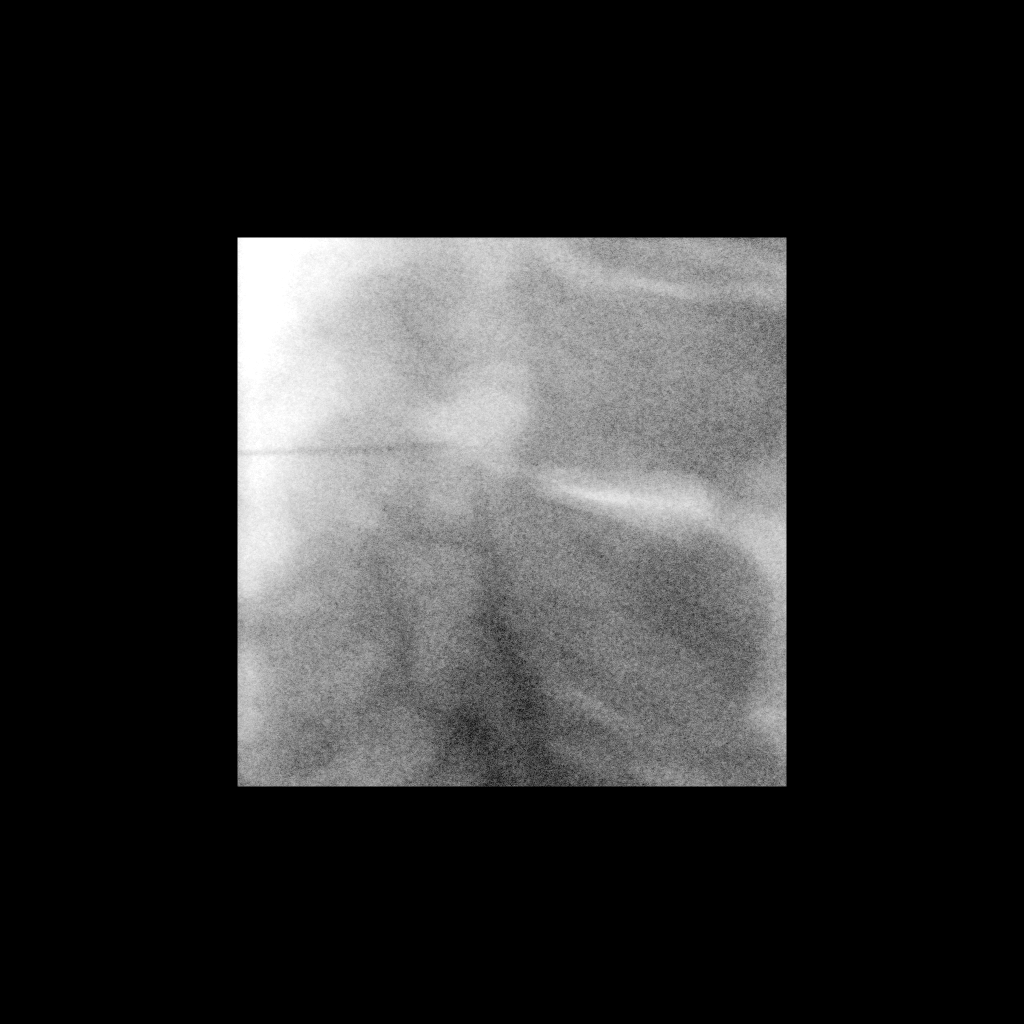

[Series 4: ortho adipose · 1 of 1 slices shown (4 of 4)]
[im 1/1]
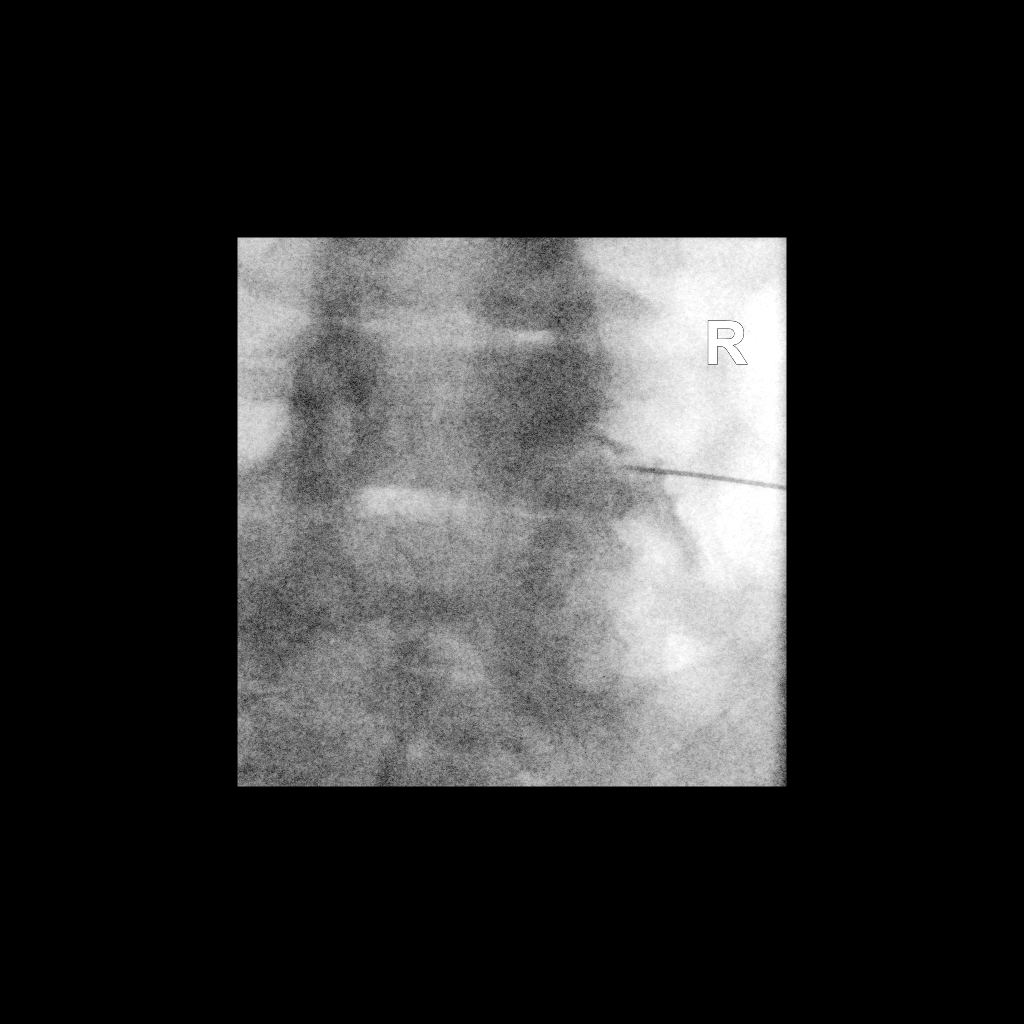

[4 of 4 positions shown; findings below may reference images not displayed]

EXAM:
EPIDURAL/NERVE ROOT

FLUOROSCOPY TIME:  Fluoroscopy Time: 38 seconds

Radiation Exposure Index: 53.87 microGray*m^2

PROCEDURE:
The procedure, risks, benefits, and alternatives were explained to
the patient. Questions regarding the procedure were encouraged and
answered. The patient understands and consents to the procedure.

RIGHT L3 NERVE ROOT BLOCK AND TRANSFORAMINAL EPIDURAL: A posterior
oblique approach was taken to the intervertebral foramen on the
right at L3-4 using a curved 3.5 inch 22 gauge spinal needle.
Injection of Isovue-M 200 outlined the right L3 nerve root and
showed good epidural spread. No vascular opacification is seen. 40
mg of Depo-Medrol mixed with 1.5 mL of 1% lidocaine were instilled.

RIGHT L4 NERVE ROOT BLOCK AND TRANSFORAMINAL EPIDURAL: A posterior
oblique approach was taken to the intervertebral foramen on the
right at L4-5 using a curved 3.5 inch 22 gauge spinal needle.
Injection of Isovue-M 200 outlined the right L4 nerve root and
showed good epidural spread. No vascular opacification is seen. 40
mg of Depo-Medrol mixed with 1.5 mL of 1% lidocaine were instilled.

The procedure was well-tolerated, and the patient was discharged
thirty minutes following the injection in good condition. She
reported relief of her leg pain at the time of discharge.

COMPLICATIONS:
None
IMPRESSION: Technically successful injections consisting of right L3 and right
L4 nerve root blocks and transforaminal epidurals.

## 2021-10-11 MED ORDER — METHYLPREDNISOLONE ACETATE 40 MG/ML INJ SUSP (RADIOLOG
80.0000 mg | Freq: Once | INTRAMUSCULAR | Status: AC
  Administered 2021-10-11: 80 mg via EPIDURAL

## 2021-10-11 MED ORDER — IOPAMIDOL (ISOVUE-M 200) INJECTION 41%
1.0000 mL | Freq: Once | INTRAMUSCULAR | Status: AC
  Administered 2021-10-11: 1 mL via EPIDURAL

## 2021-10-11 NOTE — Discharge Instructions (Signed)

## 2021-10-16 ENCOUNTER — Ambulatory Visit (INDEPENDENT_AMBULATORY_CARE_PROVIDER_SITE_OTHER): Payer: Medicare Other | Admitting: Orthopaedic Surgery

## 2021-10-16 ENCOUNTER — Other Ambulatory Visit: Payer: Self-pay | Admitting: Specialist

## 2021-10-16 ENCOUNTER — Other Ambulatory Visit: Payer: Self-pay

## 2021-10-16 ENCOUNTER — Encounter: Payer: Self-pay | Admitting: Orthopaedic Surgery

## 2021-10-16 DIAGNOSIS — Z96651 Presence of right artificial knee joint: Secondary | ICD-10-CM

## 2021-10-16 NOTE — Telephone Encounter (Signed)
Pt called requesting a refill of Morphine. Please send to pharmacy on file. Pt asked for a call back when medication has been called into pharmacy. Pt phone number is [phone].

## 2021-10-16 NOTE — Progress Notes (Signed)
Post-Op Visit Note   Patient: Kristin Gilmore           Date of Birth: 09-10-1951           MRN: [id number] Visit Date: 10/16/2021 PCP: Aletha Halim., PA-C   Assessment & Plan:  Chief Complaint:  Chief Complaint  Patient presents with   Right Knee - Pain   Visit Diagnoses:  1. Status post total right knee replacement     Plan: Jonny is 3 months status post right total knee replacement.  She is doing well overall reports some mild pain in the knee but mainly radicular pain in the right leg.  Scheduled for back surgery with Dr. Vernell Barrier later this month.  Right knee shows fully healed surgical scar.  Range of motion is 5 to 115 degrees.  Stable to varus valgus.  Overall she is recovering well from surgery.  She should continue to do her strengthening exercises as much as she can.  I think this will help with recovery from her back surgery as well.  I will see her back in 3 months with two-view x-rays of the right knee.  Follow-Up Instructions: Return in about 3 months (around 01/14/2022).   Orders:  No orders of the defined types were placed in this encounter.  No orders of the defined types were placed in this encounter.   Imaging: No results found.  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   Past Medical History:  Diagnosis Date   Arthritis    Depression    Dyslipidemia    Hypertension    Migraine    Migraine without aura, without mention of  intractable migraine without mention of status migrainosus 04/13/2014   Parkinson's disease ([place]) 01/24/2021   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   Pre-diabetes    "i was told i was pre-diabetic a while ago"   Tremor 04/13/2014    Family History  Problem Relation Age of Onset   Cancer Father        stomach cancer   Migraines Mother     Past Surgical History:  Procedure Laterality Date   CHOLECYSTECTOMY     COLONOSCOPY W/ POLYPECTOMY     LUMBAR LAMINECTOMY  07/2020   3 level decompression - [place], [place]   LUMBAR LAMINECTOMY/DECOMPRESSION MICRODISCECTOMY N/A 12/19/2017   Procedure: LAMINECTOMY LUMBAR TWO- LUMBAR THREE, LUMBAR THREE- LUMBAR FOUR, LUMBAR FOUR- LUMBAR FIVE ;  Surgeon: Consuella Lose, MD;  Location: [facility];  Service: Neurosurgery;  Laterality: N/A;   NASAL SINUS SURGERY     TONSILLECTOMY     TOTAL KNEE ARTHROPLASTY Right 06/26/2021   Procedure: RIGHT TOTAL KNEE ARTHROPLASTY;  Surgeon: Leandrew Koyanagi, MD;  Location: [facility];  Service: Orthopedics;  Laterality: Right;   WISDOM TOOTH EXTRACTION     Social History   Occupational History   Occupation: Retired  Tobacco Use   Smoking status: Never   Smokeless tobacco: Never  Vaping Use   Vaping Use: Never used  Substance and Sexual  Activity   Alcohol use: Yes    Comment: occasional glass of wine   Drug use: No   Sexual activity: Not on file

## 2021-10-17 MED ORDER — MORPHINE SULFATE ER 15 MG PO TBCR: 15.0000 mg | EXTENDED_RELEASE_TABLET | Freq: Two times a day (BID) | ORAL | 0 refills | Status: DC

## 2021-10-18 ENCOUNTER — Encounter: Payer: Self-pay | Admitting: Specialist

## 2021-10-18 ENCOUNTER — Ambulatory Visit (INDEPENDENT_AMBULATORY_CARE_PROVIDER_SITE_OTHER): Payer: Medicare Other | Admitting: Specialist

## 2021-10-18 ENCOUNTER — Telehealth: Payer: Self-pay | Admitting: Specialist

## 2021-10-18 ENCOUNTER — Other Ambulatory Visit: Payer: Self-pay

## 2021-10-18 VITALS — BP 123/54 | HR 83 | Ht 67.5 in | Wt 147.0 lb

## 2021-10-18 DIAGNOSIS — M5416 Radiculopathy, lumbar region: Secondary | ICD-10-CM | POA: Diagnosis not present

## 2021-10-18 DIAGNOSIS — M4156 Other secondary scoliosis, lumbar region: Secondary | ICD-10-CM

## 2021-10-18 DIAGNOSIS — M4316 Spondylolisthesis, lumbar region: Secondary | ICD-10-CM

## 2021-10-18 DIAGNOSIS — M4726 Other spondylosis with radiculopathy, lumbar region: Secondary | ICD-10-CM

## 2021-10-18 DIAGNOSIS — Z96651 Presence of right artificial knee joint: Secondary | ICD-10-CM

## 2021-10-18 NOTE — Patient Instructions (Signed)
Avoid bending, stooping and avoid lifting weights greater than 10 lbs. Avoid prolong standing and walking. Avoid frequent bending and stooping  No lifting greater than 10 lbs. May use ice or moist heat for pain. Weight loss is of benefit. Handicap license is approved. I would like you to call Monday and let us know how you are feeling. If the pain in the right leg returns and you are having increasing right leg pain.  You are still limited in standing and walking so that the difficulty walking is enough to make intervention.

## 2021-10-18 NOTE — Telephone Encounter (Signed)
Patient came in today

## 2021-10-18 NOTE — Telephone Encounter (Signed)
Pt called. States her husband is feeling sick and she does not think he can bring her. Anyway she can be worked in Monday afternoon? She wants to be seen before surgery.   CB [phone]

## 2021-10-18 NOTE — Progress Notes (Addendum)
Office Visit Note   Patient: Kristin Gilmore           Date of Birth: 1950-12-17           MRN: [id number] Visit Date: 10/18/2021              Requested by: Aletha Halim., PA-C [address]  [place] [address] PCP: Aletha Halim., PA-C   Assessment & Plan: Visit Diagnoses:  1. Spondylolisthesis, lumbar region   2. Radiculopathy, lumbar region   3. Other spondylosis with radiculopathy, lumbar region   4. Other secondary scoliosis, lumbar region   5. Status post total right knee replacement   71 year old female with severe right leg radicular pain post right TKR, has been requiring oxycontin and oxy IR to keep pain controlled. She was unable to tolerate the discomfort so right L3 and L4 TF ESI were done with good relief of pain. But she is still having to use a wheelchair to get around and is unable to stand upright. I recommend that we keep her on the Surgical schedule as she still stands to gain from intervention with improved standing and walking tolerance. Her parkinson's disease also playing into the need for her to be able to  Exercise and remain as functional as possible. With right leg weakness and inability to stand Upright or walk she is likely to undergo even further deterioration of function to where intervention would be more risk. She will call early next week to let us know how she is doing. I have recommended she discontinue her narcotics as she is not having much pain.   Plan: Avoid bending, stooping and avoid lifting weights greater than 10 lbs. Avoid prolong standing and walking. Avoid frequent bending and stooping  No lifting greater than 10 lbs. May use ice or moist heat for pain. Weight loss is of benefit. Handicap license is approved. I would like you to call Monday and let us know how you are feeling. If the pain in the right leg returns and you are having increasing right leg pain.  You are still limited in standing and walking so that  the difficulty walking is enough to make intervention.   Follow-Up Instructions: No follow-ups on file.   Orders:  No orders of the defined types were placed in this encounter.  No orders of the defined types were placed in this encounter.     Procedures: No procedures performed   Clinical Data: No additional findings.   Subjective: Chief Complaint  Patient presents with   Lower Back - Follow-up    71 year old female with history of right knee arthroplasty and right sided pain post knee replacement and prior to knee replacement. She has a collapsing degenerative scoliosis and a spondylolisthesis with right foramenal stenosis L3 and L4. Underwent right L3 and L4 ESIs with good relief, the injection was done last week and she reports pain levels are now 2-3.   Review of Systems  Constitutional: Negative.   HENT: Negative.    Eyes: Negative.   Respiratory: Negative.    Cardiovascular: Negative.   Gastrointestinal: Negative.   Endocrine: Negative.   Genitourinary: Negative.   Musculoskeletal: Negative.   Skin: Negative.   Allergic/Immunologic: Negative.   Neurological: Negative.   Hematological: Negative.   Psychiatric/Behavioral: Negative.      Objective: Vital Signs: BP (!) 123/54 (BP Location: Left Arm, Patient Position: Sitting)    Pulse 83    Ht 5'  7.5" (1.715 m)    Wt 147 lb (66.7 kg)    BMI 22.68 kg/m   Physical Exam Constitutional:      Appearance: She is well-developed.  HENT:     Head: Normocephalic and atraumatic.  Eyes:     Pupils: Pupils are equal, round, and reactive to light.  Pulmonary:     Effort: Pulmonary effort is normal.     Breath sounds: Normal breath sounds.  Abdominal:     General: Bowel sounds are normal.     Palpations: Abdomen is soft.  Musculoskeletal:     Cervical back: Normal range of motion and neck supple.     Lumbar back: Negative right straight leg raise test and negative left straight leg raise test.  Skin:    General:  Skin is warm and dry.  Neurological:     Mental Status: She is alert and oriented to person, place, and time.  Psychiatric:        Behavior: Behavior normal.        Thought Content: Thought content normal.        Judgment: Judgment normal.   Back Exam   Tenderness  The patient is experiencing tenderness in the lumbar.  Range of Motion  Extension:  abnormal  Flexion:  abnormal  Lateral bend right:  abnormal  Lateral bend left:  abnormal  Rotation right:  abnormal  Rotation left:  abnormal   Muscle Strength  Right Quadriceps:  5/5  Left Quadriceps:  5/5  Right Hamstrings:  5/5  Left Hamstrings:  5/5   Tests  Straight leg raise right: negative Straight leg raise left: negative  Reflexes  Patellar:  2/4 Achilles:  0/4 Biceps:  2/4  Other  Toe walk: abnormal Heel walk: abnormal Erythema: no back redness Scars: absent  Comments:  Unable to stand fully upright due to increased back and right leg. Complains of off balance. Right knee is flexed and the scar is present from previous right TKR    Specialty Comments:  No specialty comments available.  Imaging: No results found.   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   Past Medical History:  Diagnosis Date   Arthritis    Depression    Dyslipidemia    Hypertension    Migraine    Migraine without aura, without mention of intractable migraine without mention of status migrainosus 04/13/2014    Parkinson's disease ([place]) 01/24/2021   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   Pre-diabetes    "i was told i was pre-diabetic a while ago"   Tremor 04/13/2014    Family History  Problem Relation Age of Onset   Cancer Father        stomach cancer   Migraines Mother     Past Surgical History:  Procedure Laterality Date   CHOLECYSTECTOMY     COLONOSCOPY W/ POLYPECTOMY     LUMBAR LAMINECTOMY  07/2020   3 level decompression - [place], [place]   LUMBAR LAMINECTOMY/DECOMPRESSION MICRODISCECTOMY N/A 12/19/2017   Procedure: LAMINECTOMY  LUMBAR TWO- LUMBAR THREE, LUMBAR THREE- LUMBAR FOUR, LUMBAR FOUR- LUMBAR FIVE ;  Surgeon: Consuella Lose, MD;  Location: [facility];  Service: Neurosurgery;  Laterality: N/A;   NASAL SINUS SURGERY     TONSILLECTOMY     TOTAL KNEE ARTHROPLASTY Right 06/26/2021   Procedure: RIGHT TOTAL KNEE ARTHROPLASTY;  Surgeon: Leandrew Koyanagi, MD;  Location: [facility];  Service: Orthopedics;  Laterality: Right;   WISDOM TOOTH EXTRACTION     Social History   Occupational History   Occupation: Retired  Tobacco Use   Smoking status: Never   Smokeless tobacco: Never  Vaping Use   Vaping Use: Never used  Substance and Sexual Activity   Alcohol use: Yes    Comment: occasional glass of wine   Drug use: No   Sexual activity: Not on file

## 2021-10-22 ENCOUNTER — Other Ambulatory Visit: Payer: Self-pay | Admitting: Specialist

## 2021-10-22 ENCOUNTER — Telehealth: Payer: Self-pay

## 2021-10-22 NOTE — Telephone Encounter (Signed)
I will send in a prescription tomorrow, pharmacy will not refill for 2 more days as it is a twice a day tablet for pain.

## 2021-10-22 NOTE — Telephone Encounter (Signed)
Patient called. She would like some pain pills called in. She has 4 left. Her call back number is [phone]

## 2021-10-22 NOTE — Telephone Encounter (Signed)
Patient called into the office and stating that she had a lot of pain over the weekend and that she would like to follow through with the surgery.

## 2021-10-23 ENCOUNTER — Ambulatory Visit: Payer: Medicare Other | Admitting: Orthopaedic Surgery

## 2021-10-23 MED ORDER — MORPHINE SULFATE ER 15 MG PO TBCR: 15.0000 mg | EXTENDED_RELEASE_TABLET | Freq: Two times a day (BID) | ORAL | 0 refills | Status: DC

## 2021-10-23 NOTE — Telephone Encounter (Signed)
Yes, she is still scheduled.  I never heard she was going to cancel.  I called and left her a voice mail for a return call.

## 2021-10-23 NOTE — Progress Notes (Signed)
Surgical Instructions    Your procedure is scheduled on Tuesday, January 31st, 2023.   Report to [facility] Main Entrance "A" at 05:30 A.M., then check in with the Admitting office.  Call this number if you have problems the morning of surgery:  [phone]   If you have any questions prior to your surgery date call [phone]: Open Monday-Friday 8am-4pm    Remember:  Do not eat after midnight the night before your surgery  You may drink clear liquids until 04:30 the morning of your surgery.   Clear liquids allowed are: Water, Non-Citrus Juices (without pulp), Carbonated Beverages, Clear Tea, Black Coffee ONLY (NO MILK, CREAM OR POWDERED CREAMER of any kind), and Gatorade  Patient Instructions  The day of surgery (if you have diabetes): Drink ONE (1) 12 oz G2 given to you in your pre admission testing appointment by 04:30 the morning of surgery. Drink in one sitting. Do not sip.  This drink was given to you during your hospital  pre-op appointment visit.  Nothing else to drink after completing the  12 oz bottle of G2.         If you have questions, please contact your surgeons office.     Take these medicines the morning of surgery with A SIP OF WATER:  amLODipine (NORVASC) carvedilol (COREG) cephALEXin (KEFLEX) morphine (MS CONTIN)  pantoprazole (PROTONIX) predniSONE (STERAPRED UNI-PAK 21 TAB) propranolol (INDERAL)  venlafaxine XR (EFFEXOR-XR) tolterodine (DETROL LA) trihexyphenidyl (ARTANE)  If needed:  dicyclomine (BENTYL) gabapentin (NEURONTIN) HYDROcodone-acetaminophen (NORCO)  methocarbamol (ROBAXIN) glimepiride (AMARYL) ondansetron (ZOFRAN)  oxyCODONE-acetaminophen (PERCOCET) SUMAtriptan (IMITREX)    Follow your surgeon's instructions on when to stop Aspirin.  If no instructions were given by your surgeon then you will need to call the office to get those instructions.     As of today, STOP taking any Aspirin (unless otherwise instructed by your  surgeon) Aleve, Naproxen, Ibuprofen, Motrin, Advil, Goody's, BC's, all herbal medications, fish oil, and all vitamins.   WHAT DO I DO ABOUT MY DIABETES MEDICATION?   Do not take glimepiride (AMARYL) the evening before surgery and the morning of surgery.   HOW TO MANAGE YOUR DIABETES BEFORE AND AFTER SURGERY  Why is it important to control my blood sugar before and after surgery? Improving blood sugar levels before and after surgery helps healing and can limit problems. A way of improving blood sugar control is eating a healthy diet by:  Eating less sugar and carbohydrates  Increasing activity/exercise  Talking with your doctor about reaching your blood sugar goals High blood sugars (greater than 180 mg/dL) can raise your risk of infections and slow your recovery, so you will need to focus on controlling your diabetes during the weeks before surgery. Make sure that the doctor who takes care of your diabetes knows about your planned surgery including the date and location.  How do I manage my blood sugar before surgery? Check your blood sugar at least 4 times a day, starting 2 days before surgery, to make sure that the level is not too high or low.  Check your blood sugar the morning of your surgery when you wake up and every 2 hours until you get to the Short Stay unit.  If your blood sugar is less than 70 mg/dL, you will need to treat for low blood sugar: Do not take insulin. Treat a low blood sugar (less than 70 mg/dL) with  cup of clear juice (cranberry or apple), 4 glucose tablets, OR glucose gel.  Recheck blood sugar in 15 minutes after treatment (to make sure it is greater than 70 mg/dL). If your blood sugar is not greater than 70 mg/dL on recheck, call [phone] for further instructions. Report your blood sugar to the short stay nurse when you get to Short Stay.  If you are admitted to the hospital after surgery: Your blood sugar will be checked by the staff and you will  probably be given insulin after surgery (instead of oral diabetes medicines) to make sure you have good blood sugar levels. The goal for blood sugar control after surgery is 80-180 mg/dL.   After your COVID test   You are not required to quarantine however you are required to wear a well-fitting mask when you are out and around people not in your household.  If your mask becomes wet or soiled, replace with a new one.  Wash your hands often with soap and water for 20 seconds or clean your hands with an alcohol-based hand sanitizer that contains at least 60% alcohol.  Do not share personal items.  Notify your provider: if you are in close contact with someone who has COVID  or if you develop a fever of 100.4 or greater, sneezing, cough, sore throat, shortness of breath or body aches.      The day of surgery:        Do not wear jewelry or makeup Do not wear lotions, powders, perfumes, or deodorant. Do not shave 48 hours prior to surgery.   Do not bring valuables to the hospital. DO Not wear nail polish, gel polish, artificial nails, or any other type of covering on natural nails (fingers and toes) If you have artificial nails or gel coating that need to be removed by a nail salon, please have this removed prior to surgery. Artificial nails or gel coating may interfere with anesthesia's ability to adequately monitor your vital signs.              [facility] is not responsible for any belongings or valuables.  Do NOT Smoke (Tobacco/Vaping)  24 hours prior to your procedure  If you use a CPAP at night, you may bring your mask for your overnight stay.   Contacts, glasses, hearing aids, dentures or partials may not be worn into surgery, please bring cases for these belongings   For patients admitted to the hospital, discharge time will be determined by your treatment team.   Patients discharged the day of surgery will not be allowed to drive home, and someone needs to stay with them for  24 hours.  NO VISITORS WILL BE ALLOWED IN PRE-OP WHERE PATIENTS ARE PREPPED FOR SURGERY.  ONLY 1 SUPPORT PERSON MAY BE PRESENT IN THE WAITING ROOM WHILE YOU ARE IN SURGERY.  IF YOU ARE TO BE ADMITTED, ONCE YOU ARE IN YOUR ROOM YOU WILL BE ALLOWED TWO (2) VISITORS. 1 (ONE) VISITOR MAY STAY OVERNIGHT BUT MUST ARRIVE TO THE ROOM BY 8pm.  Minor children may have two parents present. Special consideration for safety and communication needs will be reviewed on a case by case basis.  Special instructions:    Oral Hygiene is also important to reduce your risk of infection.  Remember - BRUSH YOUR TEETH THE MORNING OF SURGERY WITH YOUR REGULAR TOOTHPASTE   [place]- Preparing For Surgery  Before surgery, you can play an important role. Because skin is not sterile, your skin needs to be as free of germs as possible. You can reduce the number  of germs on your skin by washing with CHG (chlorahexidine gluconate) Soap before surgery.  CHG is an antiseptic cleaner which kills germs and bonds with the skin to continue killing germs even after washing.     Please do not use if you have an allergy to CHG or antibacterial soaps. If your skin becomes reddened/irritated stop using the CHG.  Do not shave (including legs and underarms) for at least 48 hours prior to first CHG shower. It is OK to shave your face.  Please follow these instructions carefully.     Shower the NIGHT BEFORE SURGERY and the MORNING OF SURGERY with CHG Soap.   If you chose to wash your hair, wash your hair first as usual with your normal shampoo. After you shampoo, rinse your hair and body thoroughly to remove the shampoo.  Then [employer] and genitals (private parts) with your normal soap and rinse thoroughly to remove soap.  After that Use CHG Soap as you would any other liquid soap. You can apply CHG directly to the skin and wash gently with a scrungie or a clean washcloth.   Apply the CHG Soap to your body ONLY FROM THE NECK DOWN.  Do  not use on open wounds or open sores. Avoid contact with your eyes, ears, mouth and genitals (private parts). Wash Face and genitals (private parts)  with your normal soap.   Wash thoroughly, paying special attention to the area where your surgery will be performed.  Thoroughly rinse your body with warm water from the neck down.  DO NOT shower/wash with your normal soap after using and rinsing off the CHG Soap.  Pat yourself dry with a CLEAN TOWEL.  Wear CLEAN PAJAMAS to bed the night before surgery  Place CLEAN SHEETS on your bed the night before your surgery  DO NOT SLEEP WITH PETS.   Day of Surgery:  Take a shower with CHG soap. Wear Clean/Comfortable clothing the morning of surgery Do not apply any deodorants/lotions.   Remember to brush your teeth WITH YOUR REGULAR TOOTHPASTE.   Please read over the following fact sheets that you were given.

## 2021-10-24 ENCOUNTER — Ambulatory Visit (HOSPITAL_COMMUNITY)
Admission: RE | Admit: 2021-10-24 | Discharge: 2021-10-24 | Disposition: A | Discharge status: Home / Self Care | Payer: Medicare Other | Source: Ambulatory Visit | Attending: Surgery | Admitting: Surgery

## 2021-10-24 ENCOUNTER — Encounter (HOSPITAL_COMMUNITY)
Admission: RE | Admit: 2021-10-24 | Discharge: 2021-10-24 | Disposition: A | Discharge status: Home / Self Care | Payer: Medicare Other | Source: Ambulatory Visit | Attending: Specialist | Admitting: Specialist

## 2021-10-24 ENCOUNTER — Encounter (HOSPITAL_COMMUNITY): Payer: Self-pay

## 2021-10-24 ENCOUNTER — Other Ambulatory Visit: Payer: Self-pay

## 2021-10-24 VITALS — BP 142/106 | HR 93 | Temp 98.4°F | Resp 18 | Ht 67.0 in | Wt 145.0 lb

## 2021-10-24 DIAGNOSIS — Z01818 Encounter for other preprocedural examination: Secondary | ICD-10-CM | POA: Insufficient documentation

## 2021-10-24 DIAGNOSIS — Z79899 Other long term (current) drug therapy: Secondary | ICD-10-CM | POA: Insufficient documentation

## 2021-10-24 HISTORY — DX: Pneumonia, unspecified organism: J18.9

## 2021-10-24 LAB — TYPE AND SCREEN
ABO/RH(D): O POS
Antibody Screen: NEGATIVE

## 2021-10-24 LAB — COMPREHENSIVE METABOLIC PANEL
ALT: 19 U/L (ref 0–44)
AST: 18 U/L (ref 15–41)
Albumin: 3.9 g/dL (ref 3.5–5.0)
Alkaline Phosphatase: 91 U/L (ref 38–126)
Anion gap: 9 (ref 5–15)
BUN: 17 mg/dL (ref 8–23)
CO2: 29 mmol/L (ref 22–32)
Calcium: 9.3 mg/dL (ref 8.9–10.3)
Chloride: 97 mmol/L — ABNORMAL LOW (ref 98–111)
Creatinine, Ser: 0.92 mg/dL (ref 0.44–1.00)
GFR, Estimated: >60 mL/min (ref >60)
Glucose, Bld: 109 mg/dL — ABNORMAL HIGH (ref 70–99)
Potassium: 3.7 mmol/L (ref 3.5–5.1)
Sodium: 135 mmol/L (ref 135–145)
Total Bilirubin: 0.4 mg/dL (ref 0.3–1.2)
Total Protein: 6.7 g/dL (ref 6.5–8.1)

## 2021-10-24 LAB — CBC
HCT: 37.6 % (ref 36.0–46.0)
Hemoglobin: 12.2 g/dL (ref 12.0–15.0)
MCH: 30 pg (ref 26.0–34.0)
MCHC: 32.4 g/dL (ref 30.0–36.0)
MCV: 92.4 fL (ref 80.0–100.0)
Platelets: 314 10*3/uL (ref 150–400)
RBC: 4.07 MIL/uL (ref 3.87–5.11)
RDW: 14.5 % (ref 11.5–15.5)
WBC: 7.4 10*3/uL (ref 4.0–10.5)
nRBC: 0 % (ref 0.0–0.2)

## 2021-10-24 LAB — GLUCOSE, CAPILLARY: Glucose-Capillary: 114 mg/dL — ABNORMAL HIGH (ref 70–99)

## 2021-10-24 LAB — SURGICAL PCR SCREEN
MRSA, PCR: NEGATIVE
Staphylococcus aureus: NEGATIVE

## 2021-10-24 IMAGING — DX DG CHEST 2V
2 series · 2 of 2 positions shown · non-contrast
Comparison: Chest x-ray 03/13/2016

CLINICAL DATA: Preop spine surgery

EXAM:
CHEST - 2 VIEW

[w chest pa]
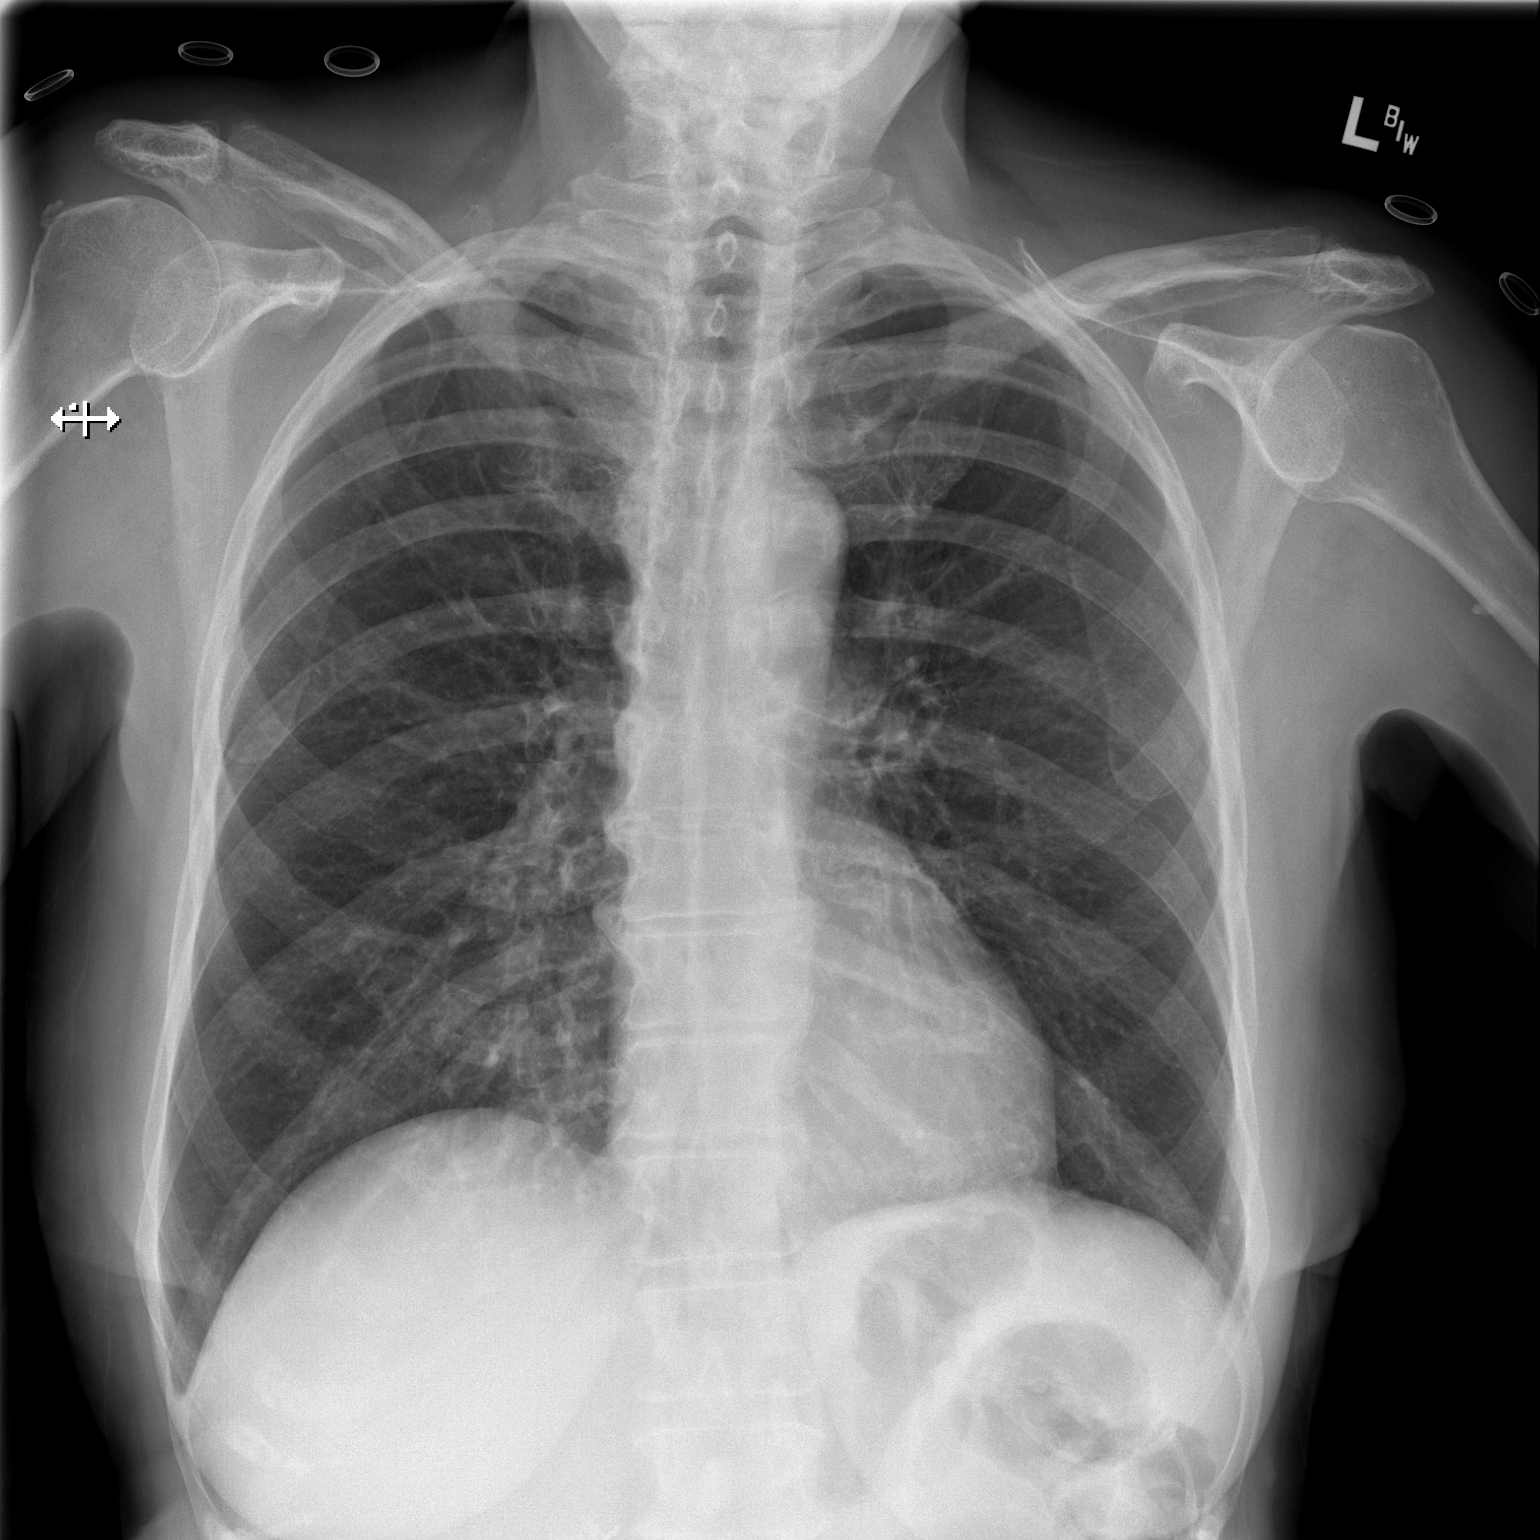

[w chest lat]
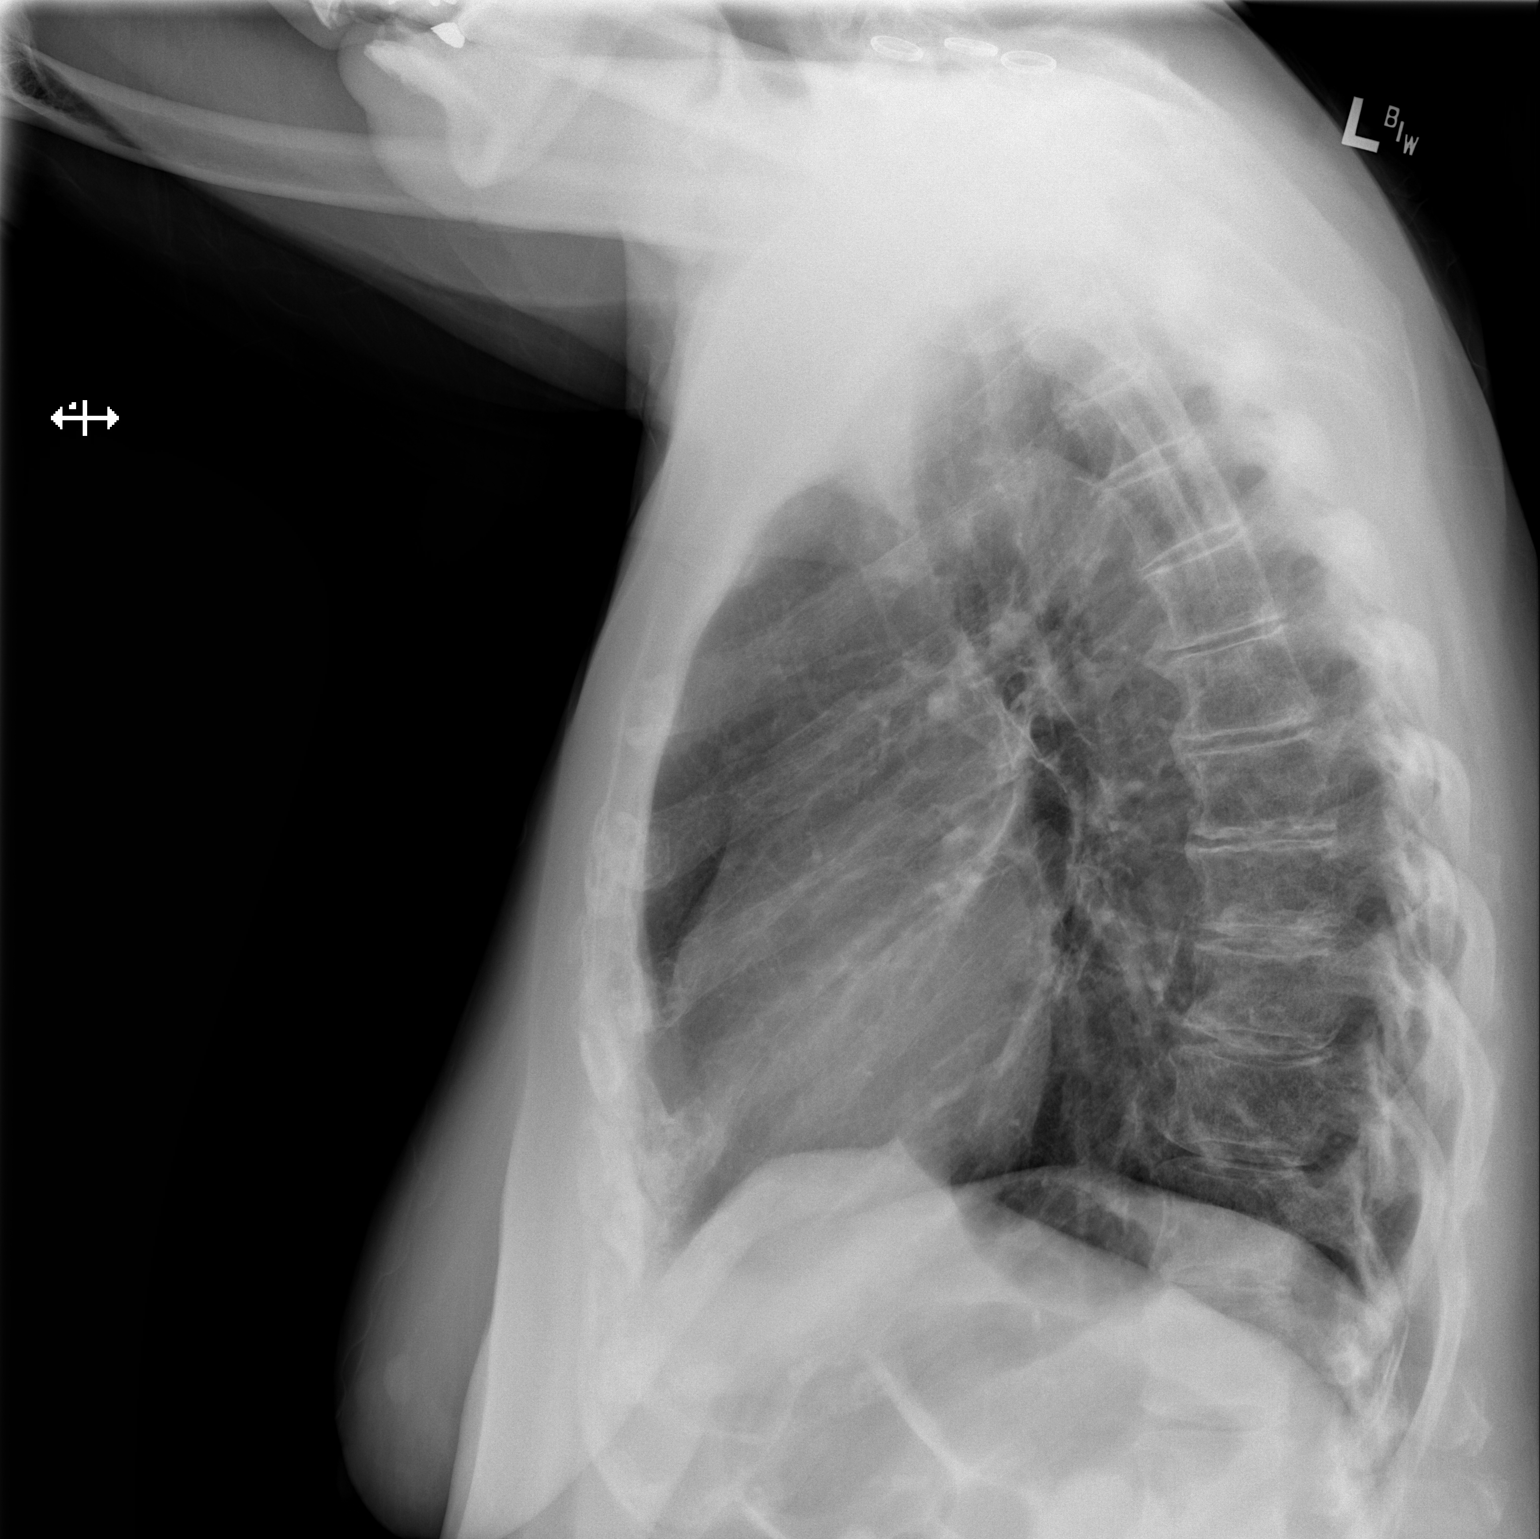

[2 of 2 positions shown; findings below may reference images not displayed]

FINDINGS: The heart size and mediastinal contours are within normal limits.
Both lungs are clear. Degenerative changes of the spine. Calcific
tendinopathy at the right shoulder
IMPRESSION: No active cardiopulmonary disease.

## 2021-10-24 NOTE — Progress Notes (Signed)
PCP - Bing Matter Cardiologist - denies  Chest x-ray - 10/24/20 EKG - 10/24/20 Stress Test - 2018 ECHO - 2017  Pre-diabetic - checks sugars 1x a week, per patient   ERAS Protcol - yes, G2 ordered & given   COVID TEST- Monday 1/30 @ 1:45 (surgery admit)   Anesthesia review: n/a  Patient denies shortness of breath, fever, cough and chest pain at PAT appointment   All instructions explained to the patient, with a verbal understanding of the material. Patient agrees to go over the instructions while at home for a better understanding. Patient also instructed to self quarantine after being tested for COVID-19. The opportunity to ask questions was provided.

## 2021-10-24 NOTE — Progress Notes (Signed)
Surgical Instructions    Your procedure is scheduled on Tuesday, January 31st.   Report to [facility] Main Entrance "A" at 05:30 A.M., then check in with the Admitting office.  Call this number if you have problems the morning of surgery:  [phone]   If you have any questions prior to your surgery date call [phone]: Open Monday-Friday 8am-4pm    Remember:  Do not eat after midnight the night before your surgery  You may drink clear liquids until 04:30 the morning of your surgery.   Clear liquids allowed are: Water, Non-Citrus Juices (without pulp), Carbonated Beverages, Clear Tea, Black Coffee ONLY (NO MILK, CREAM OR POWDERED CREAMER of any kind), and Gatorade  Please complete your PRE-SURGERY Gatorade G2 that was provided to you by 4:30 AM the morning of surgery.  Please, if able, drink it in one sitting. DO NOT SIP.  Nothing else to drink once you finish the G2    Take these medicines the morning of surgery with A SIP OF WATER:  amLODipine (NORVASC) carvedilol (COREG) cephALEXin (KEFLEX) morphine (MS CONTIN)  pantoprazole (PROTONIX) predniSONE (STERAPRED UNI-PAK 21 TAB) propranolol (INDERAL)  venlafaxine XR (EFFEXOR-XR) tolterodine (DETROL LA) trihexyphenidyl (ARTANE)  If needed:  dicyclomine (BENTYL) gabapentin (NEURONTIN) HYDROcodone-acetaminophen (NORCO)  methocarbamol (ROBAXIN) glimepiride (AMARYL) ondansetron (ZOFRAN)  oxyCODONE-acetaminophen (PERCOCET) SUMAtriptan (IMITREX)    Follow your surgeon's instructions on when to stop Aspirin.  If no instructions were given by your surgeon then you will need to call the office to get those instructions.     As of today, STOP taking any Aspirin (unless otherwise instructed by your surgeon) Aleve, Naproxen, Ibuprofen, Motrin, Advil, Goody's, BC's, all herbal medications, fish oil, and all vitamins.   WHAT DO I DO ABOUT MY DIABETES MEDICATION?   Do not take glimepiride (AMARYL) the evening before surgery or  the morning of surgery.   HOW TO MANAGE YOUR DIABETES BEFORE AND AFTER SURGERY  Why is it important to control my blood sugar before and after surgery? Improving blood sugar levels before and after surgery helps healing and can limit problems. A way of improving blood sugar control is eating a healthy diet by:  Eating less sugar and carbohydrates  Increasing activity/exercise  Talking with your doctor about reaching your blood sugar goals High blood sugars (greater than 180 mg/dL) can raise your risk of infections and slow your recovery, so you will need to focus on controlling your diabetes during the weeks before surgery. Make sure that the doctor who takes care of your diabetes knows about your planned surgery including the date and location.  How do I manage my blood sugar before surgery? Check your blood sugar at least 4 times a day, starting 2 days before surgery, to make sure that the level is not too high or low.  Check your blood sugar the morning of your surgery when you wake up and every 2 hours until you get to the Short Stay unit.  If your blood sugar is less than 70 mg/dL, you will need to treat for low blood sugar: Do not take insulin. Treat a low blood sugar (less than 70 mg/dL) with  cup of clear juice (cranberry or apple), 4 glucose tablets, OR glucose gel. Recheck blood sugar in 15 minutes after treatment (to make sure it is greater than 70 mg/dL). If your blood sugar is not greater than 70 mg/dL on recheck, call [phone] for further instructions. Report your blood sugar to the short stay nurse when you get  to Short Stay.  If you are admitted to the hospital after surgery: Your blood sugar will be checked by the staff and you will probably be given insulin after surgery (instead of oral diabetes medicines) to make sure you have good blood sugar levels. The goal for blood sugar control after surgery is 80-180 mg/dL.   After your COVID test   You are not required  to quarantine however you are required to wear a well-fitting mask when you are out and around people not in your household.  If your mask becomes wet or soiled, replace with a new one.  Wash your hands often with soap and water for 20 seconds or clean your hands with an alcohol-based hand sanitizer that contains at least 60% alcohol.  Do not share personal items.  Notify your provider: if you are in close contact with someone who has COVID  or if you develop a fever of 100.4 or greater, sneezing, cough, sore throat, shortness of breath or body aches.      The day of surgery:        Do not wear jewelry, makeup, or nail polish Do not wear lotions, powders, perfumes, or deodorant. Do not shave 48 hours prior to surgery.   Do not bring valuables to the hospital.              [facility] is not responsible for any belongings or valuables.  Do NOT Smoke (Tobacco/Vaping)  24 hours prior to your procedure  If you use a CPAP at night, you may bring your mask for your overnight stay.   Contacts, glasses, hearing aids, dentures or partials may not be worn into surgery, please bring cases for these belongings   For patients admitted to the hospital, discharge time will be determined by your treatment team.   Patients discharged the day of surgery will not be allowed to drive home, and someone needs to stay with them for 24 hours.  NO VISITORS WILL BE ALLOWED IN PRE-OP WHERE PATIENTS ARE PREPPED FOR SURGERY.  ONLY 1 SUPPORT PERSON MAY BE PRESENT IN THE WAITING ROOM WHILE YOU ARE IN SURGERY.  IF YOU ARE TO BE ADMITTED, ONCE YOU ARE IN YOUR ROOM YOU WILL BE ALLOWED TWO (2) VISITORS. 1 (ONE) VISITOR MAY STAY OVERNIGHT BUT MUST ARRIVE TO THE ROOM BY 8pm.  Minor children may have two parents present. Special consideration for safety and communication needs will be reviewed on a case by case basis.  Special instructions:    Oral Hygiene is also important to reduce your risk of infection.  Remember -  BRUSH YOUR TEETH THE MORNING OF SURGERY WITH YOUR REGULAR TOOTHPASTE   [place]- Preparing For Surgery  Before surgery, you can play an important role. Because skin is not sterile, your skin needs to be as free of germs as possible. You can reduce the number of germs on your skin by washing with CHG (chlorahexidine gluconate) Soap before surgery.  CHG is an antiseptic cleaner which kills germs and bonds with the skin to continue killing germs even after washing.     Please do not use if you have an allergy to CHG or antibacterial soaps. If your skin becomes reddened/irritated stop using the CHG.  Do not shave (including legs and underarms) for at least 48 hours prior to first CHG shower. It is OK to shave your face.  Please follow these instructions carefully.     Shower the NIGHT BEFORE SURGERY and the [facility]  OF SURGERY with CHG Soap.   If you chose to wash your hair, wash your hair first as usual with your normal shampoo. After you shampoo, rinse your hair and body thoroughly to remove the shampoo.  Then [employer] and genitals (private parts) with your normal soap and rinse thoroughly to remove soap.  After that Use CHG Soap as you would any other liquid soap. You can apply CHG directly to the skin and wash gently with a scrungie or a clean washcloth.   Apply the CHG Soap to your body ONLY FROM THE NECK DOWN.  Do not use on open wounds or open sores. Avoid contact with your eyes, ears, mouth and genitals (private parts). Wash Face and genitals (private parts)  with your normal soap.   Wash thoroughly, paying special attention to the area where your surgery will be performed.  Thoroughly rinse your body with warm water from the neck down.  DO NOT shower/wash with your normal soap after using and rinsing off the CHG Soap.  Pat yourself dry with a CLEAN TOWEL.  Wear CLEAN PAJAMAS to bed the night before surgery  Place CLEAN SHEETS on your bed the night before your surgery  DO NOT  SLEEP WITH PETS.   Day of Surgery:  Take a shower with CHG soap. Wear Clean/Comfortable clothing the morning of surgery Do not apply any deodorants/lotions.   Remember to brush your teeth WITH YOUR REGULAR TOOTHPASTE.   Please read over the following fact sheets that you were given.

## 2021-10-25 ENCOUNTER — Other Ambulatory Visit: Payer: Self-pay | Admitting: Specialist

## 2021-10-25 ENCOUNTER — Ambulatory Visit: Payer: Medicare Other | Admitting: Surgery

## 2021-10-25 ENCOUNTER — Telehealth: Payer: Self-pay | Admitting: Surgery

## 2021-10-25 ENCOUNTER — Encounter: Payer: Self-pay | Admitting: Surgery

## 2021-10-25 ENCOUNTER — Ambulatory Visit (INDEPENDENT_AMBULATORY_CARE_PROVIDER_SITE_OTHER): Payer: Medicare Other | Admitting: Surgery

## 2021-10-25 VITALS — BP 123/54 | HR 84 | Ht 67.5 in | Wt 140.0 lb

## 2021-10-25 DIAGNOSIS — M4316 Spondylolisthesis, lumbar region: Secondary | ICD-10-CM

## 2021-10-25 DIAGNOSIS — M5416 Radiculopathy, lumbar region: Secondary | ICD-10-CM

## 2021-10-25 NOTE — Telephone Encounter (Signed)
Called patient left message to return call to R/S her pro op appointment with Jeneen Rinks for tomorrow morning.

## 2021-10-25 NOTE — Progress Notes (Signed)
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  States that symptoms unchanged from previous visit.  She is wanting to proceed with L3-4 and L4-5 fusion as scheduled.  Today history and physical performed.  Review of systems positive for some constipation.  Today surgical procedure briefly discussed along with potential hospital stay.  All questions answered.  Patient's husband has Parkinson's and we discussed her possibly needing a short skilled nurse facility placement for rehab.

## 2021-10-25 NOTE — Telephone Encounter (Signed)
Pt was sent wrong medication. Pt asking for her oxycodone. Please send to pharmacy on file. Pt phone number is [phone].

## 2021-10-26 ENCOUNTER — Telehealth: Payer: Self-pay | Admitting: Specialist

## 2021-10-26 ENCOUNTER — Telehealth: Payer: Self-pay | Admitting: Surgery

## 2021-10-26 NOTE — Telephone Encounter (Signed)
Pt called requesting a refill of oxycodone. Pt states the wrong pain medication was sent in. Please call pt at [phone].

## 2021-10-26 NOTE — Telephone Encounter (Signed)
I called pt she was asking, he had said previously 4 level fusion, but now if a 2 level fusion. I advised that since she had a new MRI he decided from tat scan to do a 2 level.She said ok

## 2021-10-26 NOTE — Telephone Encounter (Signed)
Patient called asked for a call back her surgery on Tuesday. Patient would not give any more information.  The number to contact patient is [phone]

## 2021-10-29 ENCOUNTER — Telehealth: Payer: Self-pay | Admitting: Specialist

## 2021-10-29 ENCOUNTER — Ambulatory Visit: Payer: Medicare Other | Admitting: Diagnostic Neuroimaging

## 2021-10-29 ENCOUNTER — Other Ambulatory Visit (HOSPITAL_COMMUNITY)
Admission: RE | Admit: 2021-10-29 | Discharge: 2021-10-29 | Disposition: A | Discharge status: Home / Self Care | Payer: Medicare Other | Source: Ambulatory Visit | Attending: Specialist | Admitting: Specialist

## 2021-10-29 DIAGNOSIS — Z01812 Encounter for preprocedural laboratory examination: Secondary | ICD-10-CM | POA: Insufficient documentation

## 2021-10-29 DIAGNOSIS — Z01818 Encounter for other preprocedural examination: Secondary | ICD-10-CM

## 2021-10-29 DIAGNOSIS — Z20822 Contact with and (suspected) exposure to covid-19: Secondary | ICD-10-CM | POA: Insufficient documentation

## 2021-10-29 MED ORDER — OXYCODONE-ACETAMINOPHEN 5-325 MG PO TABS: 1.0000 | ORAL_TABLET | Freq: Three times a day (TID) | ORAL | 0 refills | Status: DC | PRN

## 2021-10-29 NOTE — Anesthesia Preprocedure Evaluation (Addendum)
Anesthesia Evaluation  Patient identified by MRN, date of birth, ID band Patient awake    Reviewed: Allergy & Precautions, NPO status , Patient's Chart, lab work & pertinent test results, reviewed documented beta blocker date and time   History of Anesthesia Complications Negative for: history of anesthetic complications  Airway Mallampati: II  TM Distance: >3 FB Neck ROM: Full    Dental no notable dental hx.    Pulmonary neg pulmonary ROS,    Pulmonary exam normal        Cardiovascular hypertension, Pt. on medications and Pt. on home beta blockers Normal cardiovascular exam  TTE 2017: EF 55-60%, mild LAE    Neuro/Psych Depression  L3-4 and L4-5 lumbar spondylolisthesis, Parkinson's dx    GI/Hepatic negative GI ROS,   Endo/Other  negative endocrine ROS  Renal/GU negative Renal ROS  negative genitourinary   Musculoskeletal  (+) Arthritis , narcotic dependent  Abdominal   Peds  Hematology negative hematology ROS (+)   Anesthesia Other Findings Day of surgery medications reviewed with patient.  Reproductive/Obstetrics negative OB ROS                           Anesthesia Physical Anesthesia Plan  ASA: 2  Anesthesia Plan: General   Post-op Pain Management: Tylenol PO (pre-op)   Induction: Intravenous  PONV Risk Score and Plan: 3 and Treatment may vary due to age or medical condition, Dexamethasone and Ondansetron  Airway Management Planned: Oral ETT  Additional Equipment: Arterial line  Intra-op Plan:   Post-operative Plan: Extubation in OR  Informed Consent: I have reviewed the patients History and Physical, chart, labs and discussed the procedure including the risks, benefits and alternatives for the proposed anesthesia with the patient or authorized representative who has indicated his/her understanding and acceptance.     Dental advisory given  Plan Discussed with:  CRNA  Anesthesia Plan Comments:        Anesthesia Quick Evaluation

## 2021-10-29 NOTE — Telephone Encounter (Signed)
Pt called requesting a called back from [facility]. Pt states she has concerning questions about her surgery and if she is not satisfied with he question answer she is going to cancel her surgery but need to speak to [facility]. Pt is asking for a immediate call back. Pt phone number is [phone].

## 2021-10-29 NOTE — H&P (Signed)
Kristin Gilmore is an 71 y.o. female.   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  States that symptoms unchanged from previous visit.  She is wanting to proceed with L3-4 and L4-5 fusion as scheduled.  Today history and physical performed.  Review of systems positive for some constipation.     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   Pneumonia    Pre-diabetes    "i was told i was pre-diabetic a while ago"   Tremor 04/13/2014    Past Surgical History:  Procedure Laterality Date   CHOLECYSTECTOMY     COLONOSCOPY W/ POLYPECTOMY     LUMBAR LAMINECTOMY  07/2020   3 level decompression - [place], [place]   LUMBAR LAMINECTOMY/DECOMPRESSION MICRODISCECTOMY N/A 12/19/2017   Procedure: LAMINECTOMY LUMBAR TWO- LUMBAR THREE, LUMBAR THREE- LUMBAR FOUR, LUMBAR FOUR- LUMBAR FIVE ;  Surgeon: Consuella Lose, MD;  Location: [facility];  Service: Neurosurgery;  Laterality: N/A;   NASAL SINUS SURGERY     TONSILLECTOMY     TOTAL KNEE ARTHROPLASTY Right 06/26/2021   Procedure: RIGHT TOTAL KNEE ARTHROPLASTY;  Surgeon: Leandrew Koyanagi, MD;  Location: [facility];  Service: Orthopedics;  Laterality: Right;   WISDOM TOOTH EXTRACTION      Family History  Problem Relation Age of Onset   Cancer Father        stomach cancer   Migraines Mother    Social History:  reports that she has never smoked. She has never used smokeless tobacco. She reports current alcohol use. She reports that she does not use drugs.  Allergies:  Allergies  Allergen Reactions   Levofloxacin Other (See Comments)    Other reaction(s): Dizziness (intolerance)   Amoxicillin Hives     UNSPECIFIED REACTION  Has patient had a PCN reaction causing immediate rash, facial/tongue/throat swelling, SOB or lightheadedness with hypotension: Unknown Has patient had a PCN reaction causing severe rash involving mucus membranes or skin necrosis: Unknown Has patient had a PCN reaction that required hospitalization: Unknown Has patient had a PCN reaction occurring within the last 10 years: No If all of the above answers are "NO", then may proceed with Cephalosporin use.    Clarithromycin Hives, Other (See Comments) and Rash   Atropine Hives    No medications prior to admission.    No results found for this or any previous visit (from the past 48 hour(s)). No results found.  Review of Systems  Constitutional:  Positive for activity change.  HENT: Negative.    Respiratory: Negative.    Cardiovascular: Negative.   Gastrointestinal:  Positive for constipation.  Musculoskeletal:  Positive for back pain and gait problem.   There were no vitals taken for this visit. Physical Exam HENT:     Head: Normocephalic.     Nose: Nose normal.  Eyes:     Extraocular Movements: Extraocular movements intact.  Cardiovascular:     Rate and Rhythm: Regular rhythm.  Pulmonary:     Effort: Pulmonary effort is normal. No respiratory distress.     Breath sounds: Normal breath sounds.  Neurological:     Mental Status: She is alert and oriented to  person, place, and time.  Psychiatric:        Mood and Affect: Mood normal.     Assessment/Plan L3-4 and L4-5 HNP/stenosis   Will proceed with RIGHT L3-4 AND L4-5 TRANSFORAMINAL LUMBAR INTERBODY FUSIONS WITH RODS, SCREWS, CAGES, LOCAL BONE GRAFT, ALLOGRAFT BONE GRAFT, VIVIGEN as scheduled.   surgical procedure briefly discussed along with potential hospital stay.  All questions answered.  Patient's husband has Parkinson's and we discussed her possibly needing a short skilled nurse facility placement for rehab   Benjiman Core, PA-C 10/29/2021, 11:17  PM

## 2021-10-29 NOTE — Telephone Encounter (Signed)
Dr. Louanne Skye called and spoke with patient this morning

## 2021-10-29 NOTE — Telephone Encounter (Signed)
Thank you :)

## 2021-10-30 ENCOUNTER — Other Ambulatory Visit: Payer: Self-pay

## 2021-10-30 ENCOUNTER — Inpatient Hospital Stay (HOSPITAL_COMMUNITY)
Admission: RE | Admit: 2021-10-30 | Discharge: 2021-11-04 | DRG: 455 | Disposition: A | Discharge status: Skilled Nursing Facility | Payer: Medicare Other | Source: Ambulatory Visit | Attending: Specialist | Admitting: Specialist

## 2021-10-30 ENCOUNTER — Encounter (HOSPITAL_COMMUNITY): Payer: Self-pay | Admitting: Specialist

## 2021-10-30 ENCOUNTER — Inpatient Hospital Stay (HOSPITAL_COMMUNITY): Payer: Medicare Other

## 2021-10-30 ENCOUNTER — Inpatient Hospital Stay (HOSPITAL_COMMUNITY): Payer: Medicare Other | Admitting: Anesthesiology

## 2021-10-30 ENCOUNTER — Inpatient Hospital Stay (HOSPITAL_COMMUNITY)
Admission: RE | Disposition: A | Discharge status: Skilled Nursing Facility | Payer: Self-pay | Source: Ambulatory Visit | Attending: Specialist

## 2021-10-30 DIAGNOSIS — M4156 Other secondary scoliosis, lumbar region: Secondary | ICD-10-CM | POA: Diagnosis present

## 2021-10-30 DIAGNOSIS — I10 Essential (primary) hypertension: Secondary | ICD-10-CM | POA: Diagnosis present

## 2021-10-30 DIAGNOSIS — M4317 Spondylolisthesis, lumbosacral region: Secondary | ICD-10-CM | POA: Diagnosis present

## 2021-10-30 DIAGNOSIS — Z888 Allergy status to other drugs, medicaments and biological substances status: Secondary | ICD-10-CM

## 2021-10-30 DIAGNOSIS — G20 Parkinson's disease: Secondary | ICD-10-CM | POA: Diagnosis present

## 2021-10-30 DIAGNOSIS — D500 Iron deficiency anemia secondary to blood loss (chronic): Secondary | ICD-10-CM | POA: Diagnosis present

## 2021-10-30 DIAGNOSIS — K5900 Constipation, unspecified: Secondary | ICD-10-CM | POA: Diagnosis not present

## 2021-10-30 DIAGNOSIS — Z880 Allergy status to penicillin: Secondary | ICD-10-CM | POA: Diagnosis not present

## 2021-10-30 DIAGNOSIS — M4316 Spondylolisthesis, lumbar region: Secondary | ICD-10-CM | POA: Diagnosis present

## 2021-10-30 DIAGNOSIS — R1084 Generalized abdominal pain: Secondary | ICD-10-CM

## 2021-10-30 DIAGNOSIS — Z96651 Presence of right artificial knee joint: Secondary | ICD-10-CM | POA: Diagnosis present

## 2021-10-30 DIAGNOSIS — F419 Anxiety disorder, unspecified: Secondary | ICD-10-CM | POA: Diagnosis present

## 2021-10-30 DIAGNOSIS — Z419 Encounter for procedure for purposes other than remedying health state, unspecified: Secondary | ICD-10-CM

## 2021-10-30 DIAGNOSIS — Z20822 Contact with and (suspected) exposure to covid-19: Secondary | ICD-10-CM | POA: Diagnosis present

## 2021-10-30 DIAGNOSIS — M4150 Other secondary scoliosis, site unspecified: Secondary | ICD-10-CM | POA: Diagnosis present

## 2021-10-30 DIAGNOSIS — M5116 Intervertebral disc disorders with radiculopathy, lumbar region: Secondary | ICD-10-CM | POA: Diagnosis present

## 2021-10-30 DIAGNOSIS — E162 Hypoglycemia, unspecified: Secondary | ICD-10-CM | POA: Diagnosis present

## 2021-10-30 DIAGNOSIS — Z881 Allergy status to other antibiotic agents status: Secondary | ICD-10-CM | POA: Diagnosis not present

## 2021-10-30 DIAGNOSIS — M4726 Other spondylosis with radiculopathy, lumbar region: Secondary | ICD-10-CM | POA: Diagnosis present

## 2021-10-30 DIAGNOSIS — E785 Hyperlipidemia, unspecified: Secondary | ICD-10-CM | POA: Diagnosis present

## 2021-10-30 DIAGNOSIS — Z01818 Encounter for other preprocedural examination: Secondary | ICD-10-CM

## 2021-10-30 LAB — SARS CORONAVIRUS 2 (TAT 6-24 HRS): SARS Coronavirus 2: NEGATIVE

## 2021-10-30 LAB — ABO/RH: ABO/RH(D): O POS

## 2021-10-30 LAB — GLUCOSE, CAPILLARY
Glucose-Capillary: 103 mg/dL — ABNORMAL HIGH (ref 70–99)
Glucose-Capillary: 121 mg/dL — ABNORMAL HIGH (ref 70–99)

## 2021-10-30 IMAGING — RF DG LUMBAR SPINE COMPLETE 4+V
1 series · 6 of 6 positions shown · non-contrast
Comparison: 04/09/2021

CLINICAL DATA: Lumbar fusion. Fluoroscopy time was 3 minutes and 13
seconds, 114.59 mGy

EXAM:
LUMBAR SPINE - COMPLETE 4+ VIEW

[Series 1: run · 6 of 6 slices shown]
[im 1/6]
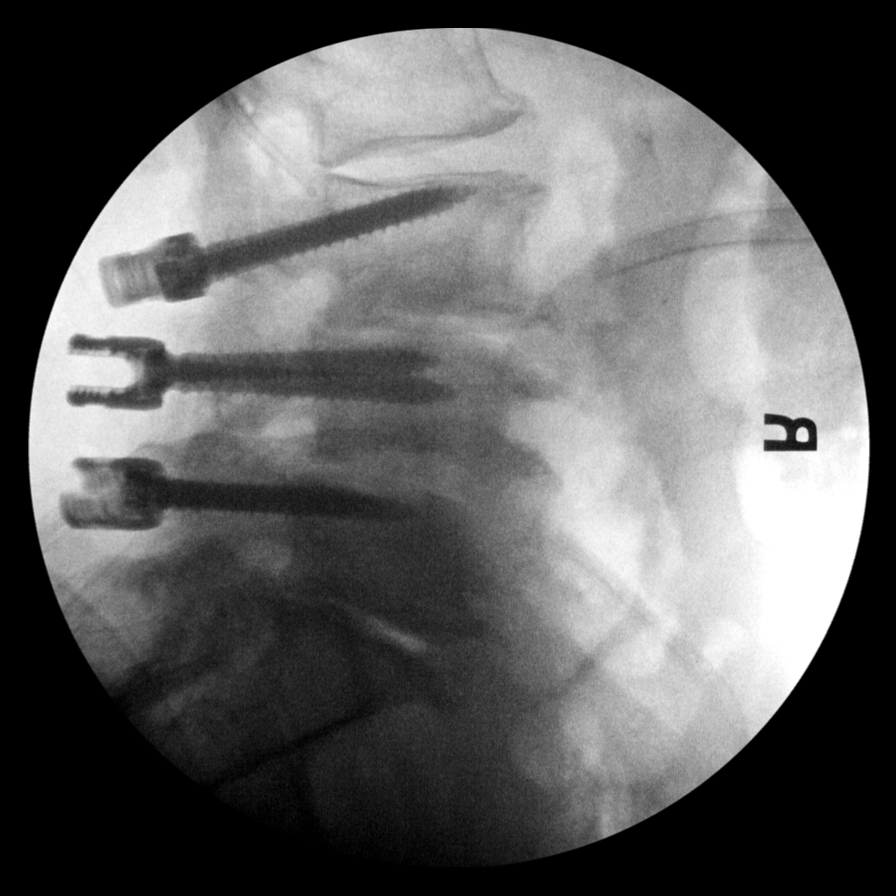
[im 2/6]
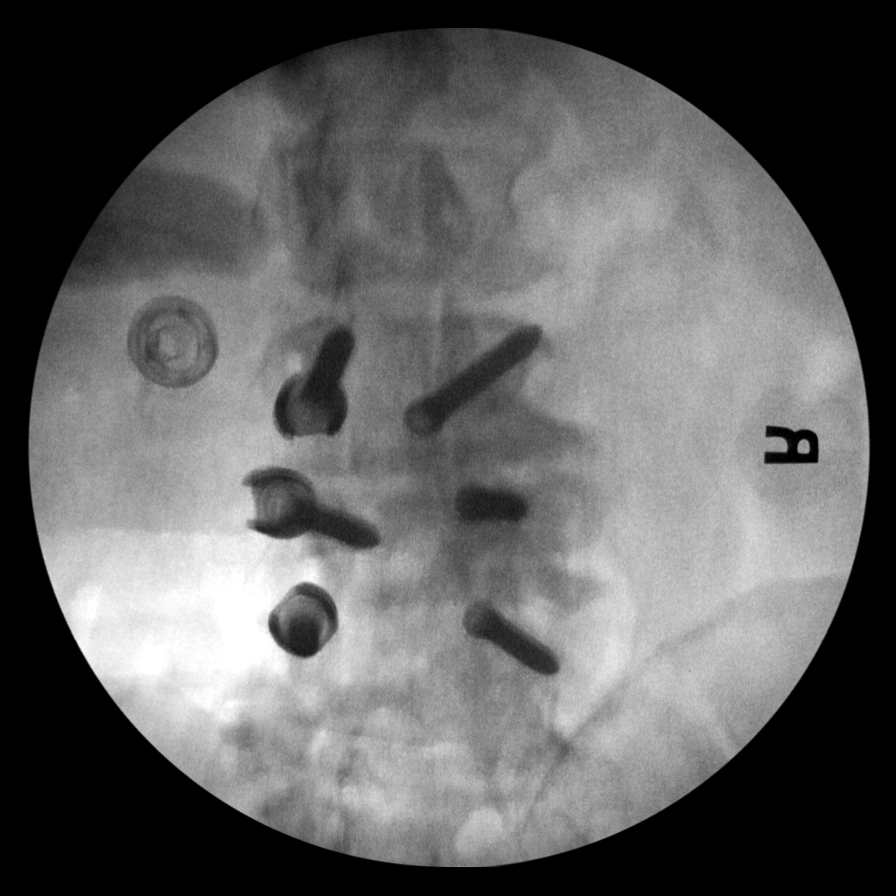
[im 3/6]
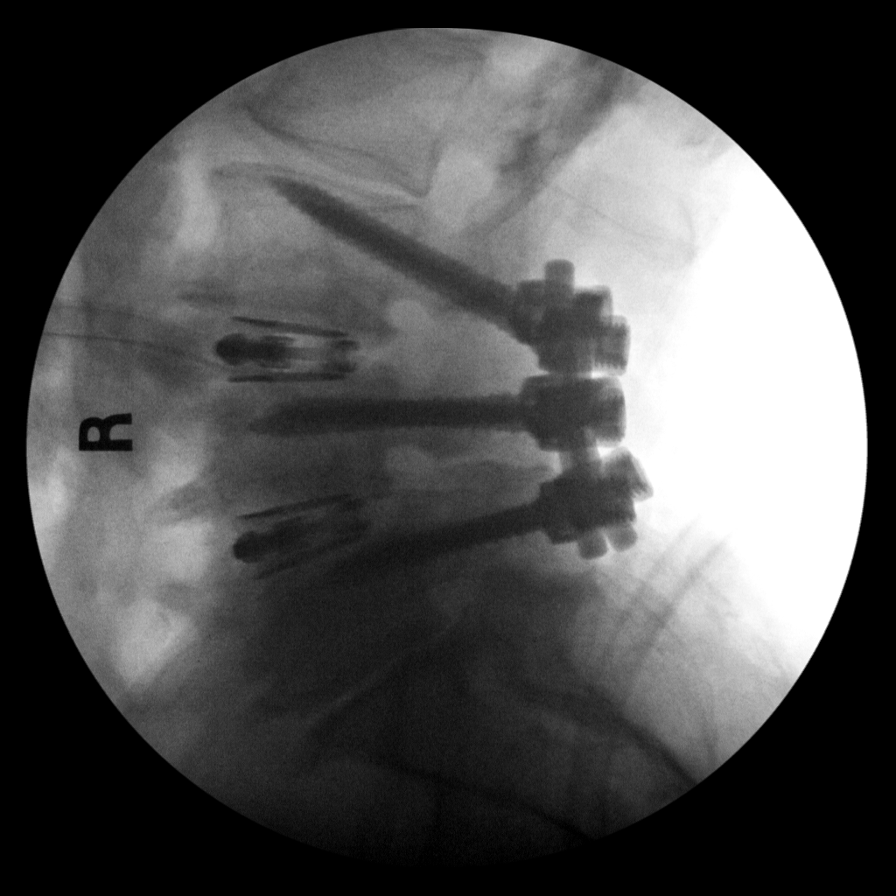
[im 4/6]
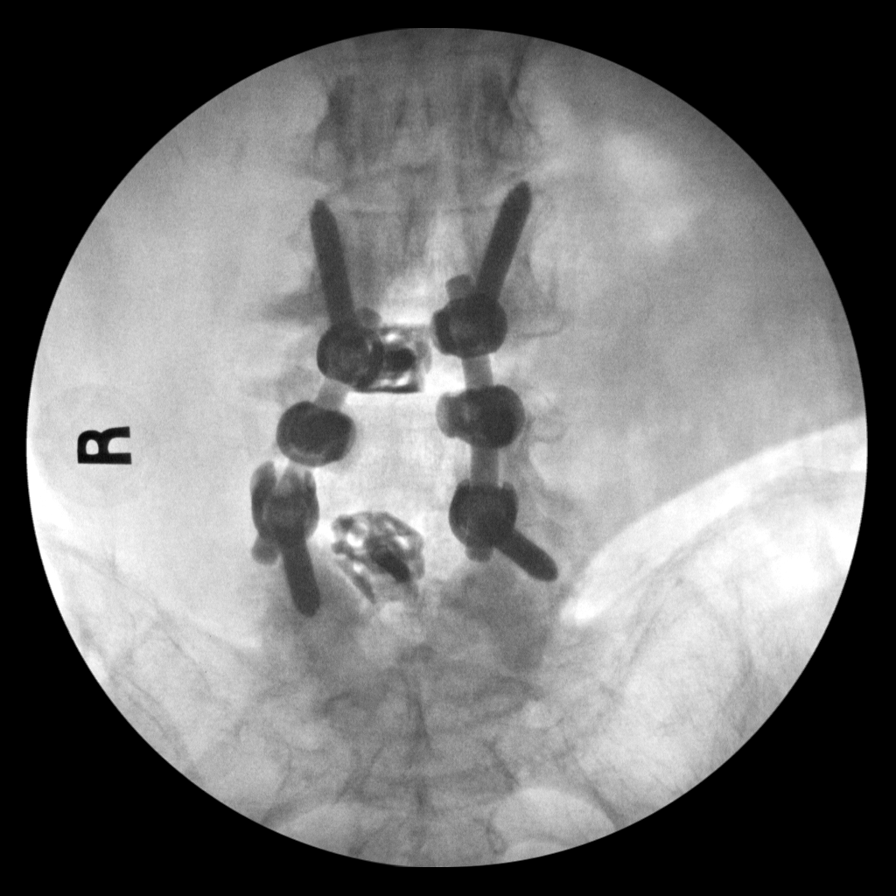
[im 5/6]
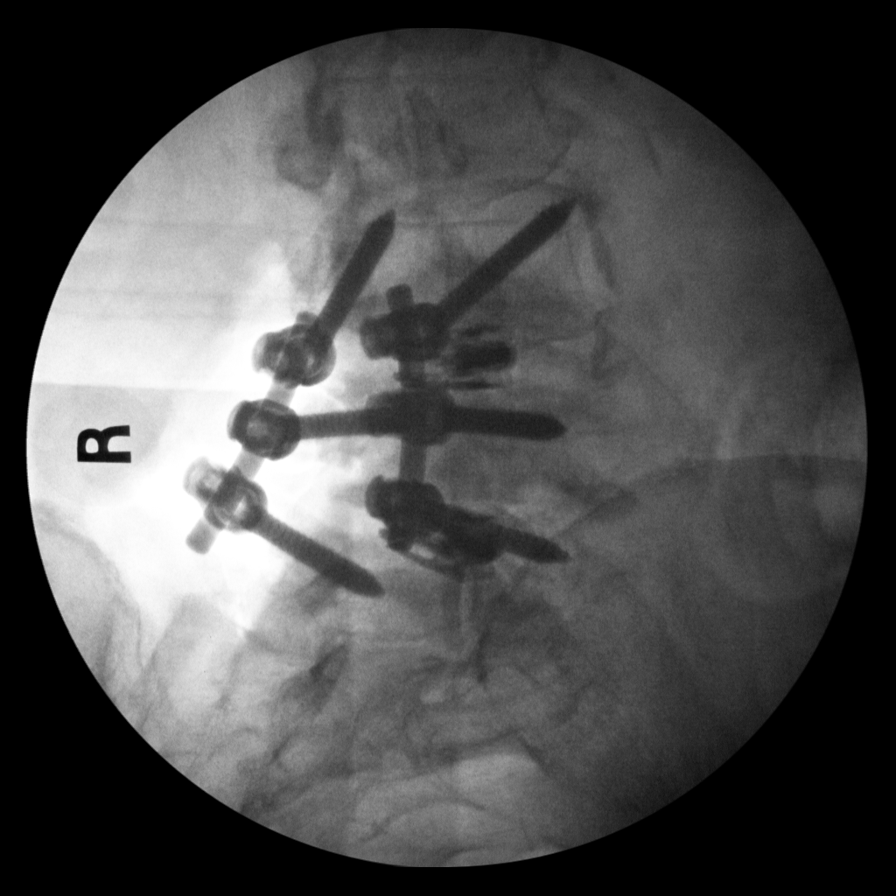
[im 6/6]
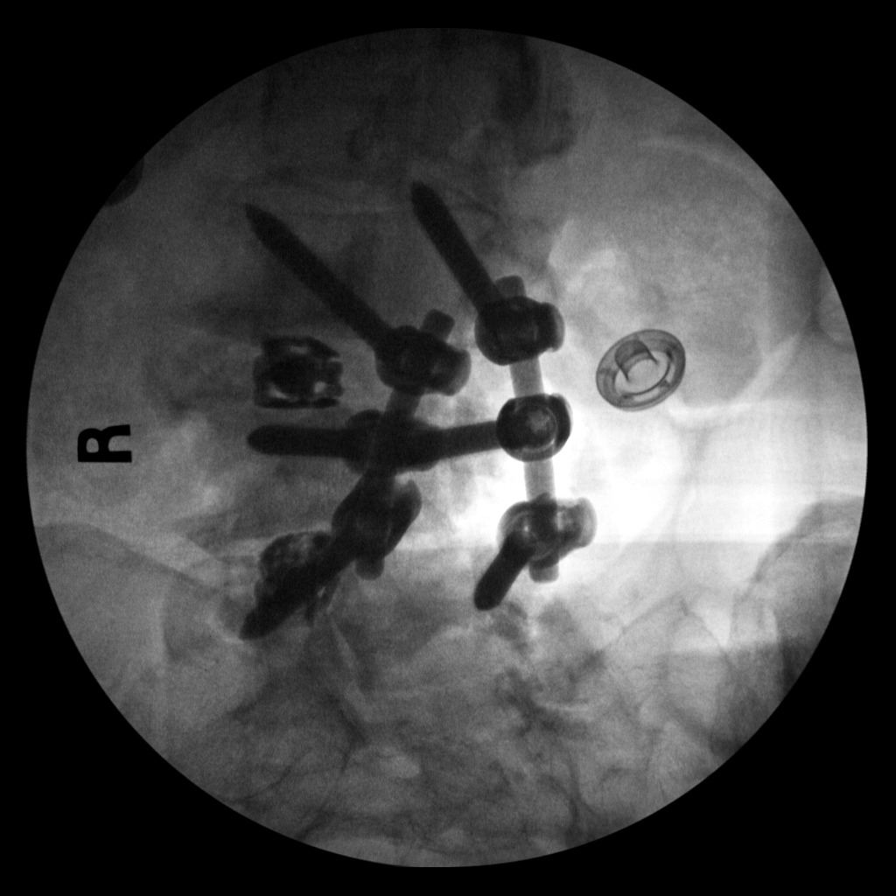

[6 of 6 positions shown; findings below may reference images not displayed]

FINDINGS: Fluoroscopic images demonstrate bilateral pedicle screw and rod
fixation at L3, L4 and L5 with interbody devices at L3-L4 and L4-L5.
Disc space narrowing at L5-S1.
IMPRESSION: Interbody fusion at L3 through L5.

## 2021-10-30 SURGERY — POSTERIOR LUMBAR FUSION 2 LEVEL
Anesthesia: General

## 2021-10-30 MED ORDER — ONDANSETRON HCL 4 MG/2ML IJ SOLN
INTRAMUSCULAR | Status: DC | PRN
  Administered 2021-10-30: 4 mg via INTRAVENOUS

## 2021-10-30 MED ORDER — LOPERAMIDE HCL 2 MG PO CAPS: 2.0000 mg | ORAL_CAPSULE | Freq: Four times a day (QID) | ORAL | Status: DC | PRN

## 2021-10-30 MED ORDER — QUETIAPINE FUMARATE 50 MG PO TABS
150.0000 mg | ORAL_TABLET | Freq: Every day | ORAL | Status: DC
  Administered 2021-10-30 – 2021-11-03 (×5): 150 mg via ORAL
  Filled 2021-10-30 (×5): qty 1

## 2021-10-30 MED ORDER — OMEGA-3-ACID ETHYL ESTERS 1 G PO CAPS
1.0000 g | ORAL_CAPSULE | Freq: Every day | ORAL | Status: DC
  Administered 2021-10-31 – 2021-11-04 (×5): 1 g via ORAL
  Filled 2021-10-30 (×5): qty 1

## 2021-10-30 MED ORDER — AMITRIPTYLINE HCL 10 MG PO TABS
30.0000 mg | ORAL_TABLET | Freq: Every day | ORAL | Status: DC
  Administered 2021-10-30 – 2021-11-03 (×5): 30 mg via ORAL
  Filled 2021-10-30 (×6): qty 3

## 2021-10-30 MED ORDER — ALUM & MAG HYDROXIDE-SIMETH 200-200-20 MG/5ML PO SUSP
30.0000 mL | Freq: Four times a day (QID) | ORAL | Status: DC | PRN
  Filled 2021-10-30: qty 30

## 2021-10-30 MED ORDER — DEXMEDETOMIDINE (PRECEDEX) IN NS 20 MCG/5ML (4 MCG/ML) IV SYRINGE
PREFILLED_SYRINGE | INTRAVENOUS | Status: DC | PRN
  Administered 2021-10-30 (×2): 4 ug via INTRAVENOUS

## 2021-10-30 MED ORDER — FENTANYL CITRATE (PF) 250 MCG/5ML IJ SOLN
INTRAMUSCULAR | Status: DC | PRN
  Administered 2021-10-30: 100 ug via INTRAVENOUS
  Administered 2021-10-30 (×3): 50 ug via INTRAVENOUS

## 2021-10-30 MED ORDER — VITAMIN D 25 MCG (1000 UNIT) PO TABS
4000.0000 [IU] | ORAL_TABLET | Freq: Every day | ORAL | Status: DC
  Administered 2021-10-31 – 2021-11-03 (×4): 4000 [IU] via ORAL
  Filled 2021-10-30 (×4): qty 4

## 2021-10-30 MED ORDER — CHLORHEXIDINE GLUCONATE 0.12 % MT SOLN
15.0000 mL | Freq: Once | OROMUCOSAL | Status: AC
  Administered 2021-10-30: 15 mL via OROMUCOSAL
  Filled 2021-10-30: qty 15

## 2021-10-30 MED ORDER — SODIUM CHLORIDE 0.9% FLUSH
3.0000 mL | Freq: Two times a day (BID) | INTRAVENOUS | Status: DC
  Administered 2021-10-31 – 2021-11-04 (×5): 3 mL via INTRAVENOUS

## 2021-10-30 MED ORDER — OXYCODONE HCL 5 MG PO TABS
5.0000 mg | ORAL_TABLET | ORAL | Status: DC | PRN
  Filled 2021-10-30 (×2): qty 1

## 2021-10-30 MED ORDER — ORAL CARE MOUTH RINSE: 15.0000 mL | Freq: Once | OROMUCOSAL | Status: AC

## 2021-10-30 MED ORDER — ACETAMINOPHEN 650 MG RE SUPP: 650.0000 mg | RECTAL | Status: DC | PRN

## 2021-10-30 MED ORDER — HYDROMORPHONE HCL 1 MG/ML IJ SOLN
INTRAMUSCULAR | Status: AC
  Filled 2021-10-30: qty 1

## 2021-10-30 MED ORDER — IRBESARTAN 300 MG PO TABS
300.0000 mg | ORAL_TABLET | Freq: Every day | ORAL | Status: DC
  Administered 2021-10-31 – 2021-11-04 (×5): 300 mg via ORAL
  Filled 2021-10-30 (×5): qty 1

## 2021-10-30 MED ORDER — SIMETHICONE 80 MG PO CHEW: 160.0000 mg | CHEWABLE_TABLET | Freq: Every day | ORAL | Status: DC | PRN

## 2021-10-30 MED ORDER — PROPOFOL 10 MG/ML IV BOLUS
INTRAVENOUS | Status: DC | PRN
  Administered 2021-10-30: 120 mg via INTRAVENOUS

## 2021-10-30 MED ORDER — DOCUSATE SODIUM 100 MG PO CAPS: 300.0000 mg | ORAL_CAPSULE | Freq: Every evening | ORAL | Status: DC | PRN

## 2021-10-30 MED ORDER — FLEET ENEMA 7-19 GM/118ML RE ENEM: 1.0000 | ENEMA | Freq: Once | RECTAL | Status: DC | PRN

## 2021-10-30 MED ORDER — FESOTERODINE FUMARATE ER 8 MG PO TB24
8.0000 mg | ORAL_TABLET | Freq: Every day | ORAL | Status: DC
  Administered 2021-10-31 – 2021-11-04 (×5): 8 mg via ORAL
  Filled 2021-10-30 (×5): qty 1

## 2021-10-30 MED ORDER — HYDROMORPHONE HCL 1 MG/ML IJ SOLN
0.2500 mg | INTRAMUSCULAR | Status: DC | PRN
  Administered 2021-10-30 (×2): 0.5 mg via INTRAVENOUS
  Administered 2021-10-30: 0.25 mg via INTRAVENOUS
  Administered 2021-10-30 (×3): 0.5 mg via INTRAVENOUS

## 2021-10-30 MED ORDER — KETOROLAC TROMETHAMINE 30 MG/ML IJ SOLN
INTRAMUSCULAR | Status: AC
  Filled 2021-10-30: qty 1

## 2021-10-30 MED ORDER — BISACODYL 5 MG PO TBEC
5.0000 mg | DELAYED_RELEASE_TABLET | Freq: Every day | ORAL | Status: DC | PRN
  Administered 2021-11-03: 5 mg via ORAL
  Filled 2021-10-30: qty 1

## 2021-10-30 MED ORDER — BUPIVACAINE LIPOSOME 1.3 % IJ SUSP
10.0000 mL | Freq: Once | INTRAMUSCULAR | Status: DC
  Filled 2021-10-30: qty 10

## 2021-10-30 MED ORDER — MIDAZOLAM HCL 2 MG/2ML IJ SOLN
INTRAMUSCULAR | Status: AC
  Filled 2021-10-30: qty 2

## 2021-10-30 MED ORDER — FENTANYL CITRATE (PF) 250 MCG/5ML IJ SOLN
INTRAMUSCULAR | Status: AC
  Filled 2021-10-30: qty 5

## 2021-10-30 MED ORDER — INSULIN ASPART 100 UNIT/ML IJ SOLN: 0.0000 [IU] | Freq: Three times a day (TID) | INTRAMUSCULAR | Status: DC

## 2021-10-30 MED ORDER — DEXAMETHASONE SODIUM PHOSPHATE 10 MG/ML IJ SOLN
INTRAMUSCULAR | Status: AC
  Filled 2021-10-30: qty 1

## 2021-10-30 MED ORDER — SODIUM CHLORIDE 0.9% FLUSH: 3.0000 mL | INTRAVENOUS | Status: DC | PRN

## 2021-10-30 MED ORDER — DEXAMETHASONE SODIUM PHOSPHATE 10 MG/ML IJ SOLN
INTRAMUSCULAR | Status: DC | PRN
Start: 2021-10-30 — End: 2021-10-30
  Administered 2021-10-30: 10 mg via INTRAVENOUS

## 2021-10-30 MED ORDER — SALINE SPRAY 0.65 % NA SOLN
1.0000 | Freq: Three times a day (TID) | NASAL | Status: DC
  Administered 2021-10-30 – 2021-11-04 (×4): 1 via NASAL
  Filled 2021-10-30: qty 44

## 2021-10-30 MED ORDER — KETOROLAC TROMETHAMINE 30 MG/ML IJ SOLN
30.0000 mg | Freq: Four times a day (QID) | INTRAMUSCULAR | Status: DC | PRN
  Administered 2021-10-30: 30 mg via INTRAVENOUS

## 2021-10-30 MED ORDER — OXYCODONE HCL ER 10 MG PO T12A
20.0000 mg | EXTENDED_RELEASE_TABLET | Freq: Two times a day (BID) | ORAL | Status: DC
  Administered 2021-10-30 – 2021-11-04 (×10): 20 mg via ORAL
  Filled 2021-10-30 (×10): qty 2

## 2021-10-30 MED ORDER — PROPOFOL 10 MG/ML IV BOLUS
INTRAVENOUS | Status: AC
  Filled 2021-10-30: qty 20

## 2021-10-30 MED ORDER — EPHEDRINE 5 MG/ML INJ
INTRAVENOUS | Status: AC
  Filled 2021-10-30: qty 5

## 2021-10-30 MED ORDER — ONDANSETRON HCL 4 MG PO TABS: 4.0000 mg | ORAL_TABLET | Freq: Four times a day (QID) | ORAL | Status: DC | PRN

## 2021-10-30 MED ORDER — VANCOMYCIN HCL IN DEXTROSE 1-5 GM/200ML-% IV SOLN
1000.0000 mg | INTRAVENOUS | Status: AC
  Administered 2021-10-30: 1000 mg via INTRAVENOUS
  Filled 2021-10-30: qty 200

## 2021-10-30 MED ORDER — PHENOL 1.4 % MT LIQD: 1.0000 | OROMUCOSAL | Status: DC | PRN

## 2021-10-30 MED ORDER — DOCUSATE SODIUM 100 MG PO CAPS
100.0000 mg | ORAL_CAPSULE | Freq: Two times a day (BID) | ORAL | Status: DC
  Administered 2021-10-30 – 2021-11-04 (×10): 100 mg via ORAL
  Filled 2021-10-30 (×10): qty 1

## 2021-10-30 MED ORDER — BUPIVACAINE HCL 0.5 % IJ SOLN
INTRAMUSCULAR | Status: DC | PRN
  Administered 2021-10-30: 15 mL

## 2021-10-30 MED ORDER — METHOCARBAMOL 1000 MG/10ML IJ SOLN
500.0000 mg | Freq: Three times a day (TID) | INTRAVENOUS | Status: DC
  Administered 2021-10-30 – 2021-11-04 (×11): 500 mg via INTRAVENOUS
  Filled 2021-10-30: qty 500
  Filled 2021-10-30 (×3): qty 5
  Filled 2021-10-30: qty 500
  Filled 2021-10-30 (×3): qty 5
  Filled 2021-10-30 (×2): qty 500
  Filled 2021-10-30: qty 5
  Filled 2021-10-30: qty 500
  Filled 2021-10-30 (×5): qty 5

## 2021-10-30 MED ORDER — ROCURONIUM BROMIDE 10 MG/ML (PF) SYRINGE
PREFILLED_SYRINGE | INTRAVENOUS | Status: DC | PRN
  Administered 2021-10-30 (×2): 50 mg via INTRAVENOUS
  Administered 2021-10-30: 30 mg via INTRAVENOUS

## 2021-10-30 MED ORDER — COQ10 100 MG PO CAPS: 100.0000 mg | ORAL_CAPSULE | Freq: Every day | ORAL | Status: DC

## 2021-10-30 MED ORDER — RED YEAST RICE 600 MG PO CAPS
1200.0000 mg | ORAL_CAPSULE | Freq: Two times a day (BID) | ORAL | Status: DC
Start: 2021-10-30 — End: 2021-10-30

## 2021-10-30 MED ORDER — RISAQUAD PO CAPS
1.0000 | ORAL_CAPSULE | Freq: Every day | ORAL | Status: DC
  Administered 2021-10-31 – 2021-11-04 (×5): 1 via ORAL
  Filled 2021-10-30 (×5): qty 1

## 2021-10-30 MED ORDER — LACTATED RINGERS IV SOLN: INTRAVENOUS | Status: DC | PRN

## 2021-10-30 MED ORDER — BUPIVACAINE LIPOSOME 1.3 % IJ SUSP
INTRAMUSCULAR | Status: AC
  Filled 2021-10-30: qty 20

## 2021-10-30 MED ORDER — ALLANTOIN 0.5 % EX GEL: 1.0000 "application " | Freq: Two times a day (BID) | CUTANEOUS | Status: DC

## 2021-10-30 MED ORDER — SODIUM CHLORIDE 0.9 % IV SOLN
250.0000 mL | INTRAVENOUS | Status: DC
Start: 2021-10-30 — End: 2021-11-04

## 2021-10-30 MED ORDER — LIDOCAINE 2% (20 MG/ML) 5 ML SYRINGE
INTRAMUSCULAR | Status: DC | PRN
Start: 2021-10-30 — End: 2021-10-30
  Administered 2021-10-30: 60 mg via INTRAVENOUS

## 2021-10-30 MED ORDER — VENLAFAXINE HCL ER 75 MG PO CP24
75.0000 mg | ORAL_CAPSULE | Freq: Every day | ORAL | Status: DC
  Administered 2021-10-31 – 2021-11-04 (×5): 75 mg via ORAL
  Filled 2021-10-30 (×5): qty 1

## 2021-10-30 MED ORDER — PHENYLEPHRINE HCL-NACL 20-0.9 MG/250ML-% IV SOLN
INTRAVENOUS | Status: DC | PRN
Start: 2021-10-30 — End: 2021-10-30
  Administered 2021-10-30: 25 ug/min via INTRAVENOUS

## 2021-10-30 MED ORDER — OXYCODONE HCL 5 MG PO TABS: 5.0000 mg | ORAL_TABLET | ORAL | Status: DC | PRN

## 2021-10-30 MED ORDER — PANTOPRAZOLE SODIUM 40 MG IV SOLR: 40.0000 mg | Freq: Every day | INTRAVENOUS | Status: DC

## 2021-10-30 MED ORDER — METHOCARBAMOL 1000 MG/10ML IJ SOLN
500.0000 mg | Freq: Four times a day (QID) | INTRAVENOUS | Status: DC | PRN
  Administered 2021-10-30: 500 mg via INTRAVENOUS
  Filled 2021-10-30: qty 500

## 2021-10-30 MED ORDER — CALCIUM CITRATE 950 (200 CA) MG PO TABS
200.0000 mg | ORAL_TABLET | Freq: Every day | ORAL | Status: DC
  Administered 2021-10-31 – 2021-11-04 (×5): 200 mg via ORAL
  Filled 2021-10-30 (×5): qty 1

## 2021-10-30 MED ORDER — SODIUM CHLORIDE 0.9 % IV SOLN: INTRAVENOUS | Status: DC

## 2021-10-30 MED ORDER — ONDANSETRON HCL 4 MG/2ML IJ SOLN: 4.0000 mg | Freq: Four times a day (QID) | INTRAMUSCULAR | Status: DC | PRN

## 2021-10-30 MED ORDER — 0.9 % SODIUM CHLORIDE (POUR BTL) OPTIME
TOPICAL | Status: DC | PRN
  Administered 2021-10-30 (×2): 1000 mL

## 2021-10-30 MED ORDER — TRIHEXYPHENIDYL HCL 2 MG PO TABS
2.0000 mg | ORAL_TABLET | Freq: Three times a day (TID) | ORAL | Status: DC
  Administered 2021-10-31 – 2021-11-04 (×13): 2 mg via ORAL
  Filled 2021-10-30 (×16): qty 1

## 2021-10-30 MED ORDER — EPHEDRINE SULFATE-NACL 50-0.9 MG/10ML-% IV SOSY
PREFILLED_SYRINGE | INTRAVENOUS | Status: DC | PRN
  Administered 2021-10-30: 5 mg via INTRAVENOUS
  Administered 2021-10-30: 10 mg via INTRAVENOUS
  Administered 2021-10-30 (×2): 5 mg via INTRAVENOUS

## 2021-10-30 MED ORDER — ONDANSETRON HCL 4 MG/2ML IJ SOLN
INTRAMUSCULAR | Status: AC
  Filled 2021-10-30: qty 2

## 2021-10-30 MED ORDER — BIOTIN 5000 MCG PO CAPS
5000.0000 ug | ORAL_CAPSULE | Freq: Every morning | ORAL | Status: DC
Start: 2021-10-31 — End: 2021-10-30

## 2021-10-30 MED ORDER — THROMBIN 20000 UNITS EX SOLR: CUTANEOUS | Status: DC | PRN

## 2021-10-30 MED ORDER — BUPIVACAINE HCL (PF) 0.5 % IJ SOLN
INTRAMUSCULAR | Status: AC
  Filled 2021-10-30: qty 30

## 2021-10-30 MED ORDER — KETOROLAC TROMETHAMINE 30 MG/ML IJ SOLN
30.0000 mg | Freq: Four times a day (QID) | INTRAMUSCULAR | Status: AC | PRN
  Administered 2021-10-31: 30 mg via INTRAVENOUS
  Filled 2021-10-30 (×2): qty 1

## 2021-10-30 MED ORDER — METHOCARBAMOL 500 MG PO TABS
500.0000 mg | ORAL_TABLET | Freq: Four times a day (QID) | ORAL | Status: DC | PRN
  Administered 2021-10-31 – 2021-11-03 (×4): 500 mg via ORAL
  Filled 2021-10-30 (×7): qty 1

## 2021-10-30 MED ORDER — ASPIRIN EC 81 MG PO TBEC
81.0000 mg | DELAYED_RELEASE_TABLET | Freq: Every day | ORAL | Status: DC
  Administered 2021-10-31 – 2021-11-04 (×5): 81 mg via ORAL
  Filled 2021-10-30 (×5): qty 1

## 2021-10-30 MED ORDER — PANTOPRAZOLE SODIUM 40 MG PO TBEC
40.0000 mg | DELAYED_RELEASE_TABLET | Freq: Two times a day (BID) | ORAL | Status: DC
  Administered 2021-10-30 – 2021-11-04 (×10): 40 mg via ORAL
  Filled 2021-10-30 (×10): qty 1

## 2021-10-30 MED ORDER — MAGNESIUM OXIDE -MG SUPPLEMENT 500 MG PO TABS: 500.0000 mg | ORAL_TABLET | Freq: Every day | ORAL | Status: DC

## 2021-10-30 MED ORDER — OXYCODONE HCL 5 MG PO TABS
10.0000 mg | ORAL_TABLET | ORAL | Status: DC | PRN
  Administered 2021-10-31 – 2021-11-04 (×10): 10 mg via ORAL
  Filled 2021-10-30 (×11): qty 2

## 2021-10-30 MED ORDER — BISACODYL 5 MG PO TBEC: 5.0000 mg | DELAYED_RELEASE_TABLET | Freq: Every day | ORAL | Status: DC | PRN

## 2021-10-30 MED ORDER — POLYETHYLENE GLYCOL 3350 17 G PO PACK
17.0000 g | PACK | Freq: Every day | ORAL | Status: DC | PRN
  Administered 2021-10-31 – 2021-11-01 (×2): 17 g via ORAL
  Filled 2021-10-30 (×2): qty 1

## 2021-10-30 MED ORDER — DEXMEDETOMIDINE (PRECEDEX) IN NS 20 MCG/5ML (4 MCG/ML) IV SYRINGE
PREFILLED_SYRINGE | INTRAVENOUS | Status: AC
  Filled 2021-10-30: qty 5

## 2021-10-30 MED ORDER — AMLODIPINE BESYLATE 5 MG PO TABS
5.0000 mg | ORAL_TABLET | Freq: Two times a day (BID) | ORAL | Status: DC
  Administered 2021-10-30 – 2021-11-04 (×9): 5 mg via ORAL
  Filled 2021-10-30 (×10): qty 1

## 2021-10-30 MED ORDER — FERROUS SULFATE 325 (65 FE) MG PO TABS
325.0000 mg | ORAL_TABLET | ORAL | Status: AC
  Administered 2021-10-31 – 2021-11-01 (×2): 325 mg via ORAL
  Filled 2021-10-30 (×2): qty 1

## 2021-10-30 MED ORDER — MENTHOL (TOPICAL ANALGESIC) 4 % EX GEL: 1.0000 "application " | Freq: Four times a day (QID) | CUTANEOUS | Status: DC | PRN

## 2021-10-30 MED ORDER — LACTINEX PO CHEW: 1.0000 | CHEWABLE_TABLET | Freq: Every day | ORAL | Status: DC

## 2021-10-30 MED ORDER — GLUCOSAMINE-CHONDROITIN 500-400 MG PO CAPS: 1.0000 | ORAL_CAPSULE | ORAL | Status: DC

## 2021-10-30 MED ORDER — CALCIUM CITRATE-VITAMIN D3 315-6.25 MG-MCG PO TABS: 1.0000 | ORAL_TABLET | ORAL | Status: DC

## 2021-10-30 MED ORDER — LIDOCAINE 2% (20 MG/ML) 5 ML SYRINGE
INTRAMUSCULAR | Status: AC
  Filled 2021-10-30: qty 5

## 2021-10-30 MED ORDER — LACTATED RINGERS IV SOLN: INTRAVENOUS | Status: DC

## 2021-10-30 MED ORDER — CALCIUM CITRATE 950 (200 CA) MG PO TABS
400.0000 mg | ORAL_TABLET | Freq: Every evening | ORAL | Status: DC
  Administered 2021-10-31 – 2021-11-03 (×4): 400 mg via ORAL
  Filled 2021-10-30 (×6): qty 2

## 2021-10-30 MED ORDER — ALBUMIN HUMAN 5 % IV SOLN: INTRAVENOUS | Status: DC | PRN

## 2021-10-30 MED ORDER — ROCURONIUM BROMIDE 10 MG/ML (PF) SYRINGE
PREFILLED_SYRINGE | INTRAVENOUS | Status: AC
  Filled 2021-10-30: qty 10

## 2021-10-30 MED ORDER — ACETAMINOPHEN 500 MG PO TABS
1000.0000 mg | ORAL_TABLET | Freq: Once | ORAL | Status: AC
  Administered 2021-10-30: 1000 mg via ORAL
  Filled 2021-10-30: qty 2

## 2021-10-30 MED ORDER — SUGAMMADEX SODIUM 200 MG/2ML IV SOLN
INTRAVENOUS | Status: DC | PRN
  Administered 2021-10-30: 200 mg via INTRAVENOUS

## 2021-10-30 MED ORDER — THROMBIN (RECOMBINANT) 20000 UNITS EX SOLR
CUTANEOUS | Status: AC
  Filled 2021-10-30: qty 20000

## 2021-10-30 MED ORDER — BUPIVACAINE LIPOSOME 1.3 % IJ SUSP
INTRAMUSCULAR | Status: DC | PRN
  Administered 2021-10-30: 15 mL

## 2021-10-30 MED ORDER — MIDAZOLAM HCL 2 MG/2ML IJ SOLN
INTRAMUSCULAR | Status: DC | PRN
  Administered 2021-10-30: 1 mg via INTRAVENOUS

## 2021-10-30 MED ORDER — MAGNESIUM OXIDE -MG SUPPLEMENT 400 (240 MG) MG PO TABS
400.0000 mg | ORAL_TABLET | Freq: Every day | ORAL | Status: DC
  Administered 2021-10-30 – 2021-11-03 (×5): 400 mg via ORAL
  Filled 2021-10-30 (×5): qty 1

## 2021-10-30 MED ORDER — MUPIROCIN 2 % EX OINT: 1.0000 "application " | TOPICAL_OINTMENT | Freq: Three times a day (TID) | CUTANEOUS | Status: DC

## 2021-10-30 MED ORDER — MENTHOL 3 MG MT LOZG: 1.0000 | LOZENGE | OROMUCOSAL | Status: DC | PRN

## 2021-10-30 MED ORDER — CARVEDILOL 12.5 MG PO TABS
12.5000 mg | ORAL_TABLET | Freq: Two times a day (BID) | ORAL | Status: DC
  Administered 2021-10-31 – 2021-11-04 (×9): 12.5 mg via ORAL
  Filled 2021-10-30 (×9): qty 1

## 2021-10-30 MED ORDER — ACETAMINOPHEN 325 MG PO TABS
650.0000 mg | ORAL_TABLET | ORAL | Status: DC | PRN
  Administered 2021-10-31 – 2021-11-03 (×5): 650 mg via ORAL
  Filled 2021-10-30 (×5): qty 2

## 2021-10-30 MED ORDER — METHOCARBAMOL 500 MG PO TABS: 500.0000 mg | ORAL_TABLET | Freq: Three times a day (TID) | ORAL | Status: DC | PRN

## 2021-10-30 MED ORDER — HYDROMORPHONE HCL 1 MG/ML IJ SOLN
0.5000 mg | INTRAMUSCULAR | Status: DC | PRN
  Administered 2021-10-30 – 2021-11-02 (×5): 0.5 mg via INTRAVENOUS
  Filled 2021-10-30 (×5): qty 0.5

## 2021-10-30 MED ORDER — PROBIOTIC 250 MG PO CAPS: ORAL_CAPSULE | Freq: Every day | ORAL | Status: DC

## 2021-10-30 MED ORDER — GABAPENTIN 300 MG PO CAPS
300.0000 mg | ORAL_CAPSULE | Freq: Three times a day (TID) | ORAL | Status: DC
  Administered 2021-10-30 – 2021-11-04 (×11): 300 mg via ORAL
  Filled 2021-10-30 (×12): qty 1

## 2021-10-30 SURGICAL SUPPLY — 84 items
ADH SKN CLS APL DERMABOND .7 (GAUZE/BANDAGES/DRESSINGS) ×1
ADH SKN CLS LQ APL DERMABOND (GAUZE/BANDAGES/DRESSINGS) ×1
AGENT HMST KT MTR STRL THRMB (HEMOSTASIS) ×1
BAG COUNTER SPONGE SURGICOUNT (BAG) ×2 IMPLANT
BAG SPNG CNTER NS LX DISP (BAG) ×1
BLADE CLIPPER SURG (BLADE) ×1 IMPLANT
BONE VIVIGEN FORMABLE 10CC (Bone Implant) ×2 IMPLANT
BONE VIVIGEN FORMABLE 5.4CC (Bone Implant) ×2 IMPLANT
BUR MATCHSTICK NEURO 3.0 LAGG (BURR) ×2 IMPLANT
BUR RND FLUTED 2.5 (BURR) IMPLANT
BUR SABER RD CUTTING 3.0 (BURR) IMPLANT
CAGE SABLE 10X26 6-12 8D (Cage) ×2 IMPLANT
CAP LOCKING THREADED (Cap) ×7 IMPLANT
COVER BACK TABLE 80X110 HD (DRAPES) ×2 IMPLANT
COVER MAYO STAND STRL (DRAPES) ×2 IMPLANT
COVER SURGICAL LIGHT HANDLE (MISCELLANEOUS) ×2 IMPLANT
DERMABOND ADHESIVE PROPEN (GAUZE/BANDAGES/DRESSINGS) ×1
DERMABOND ADVANCED (GAUZE/BANDAGES/DRESSINGS) ×1
DERMABOND ADVANCED .7 DNX12 (GAUZE/BANDAGES/DRESSINGS) ×1 IMPLANT
DERMABOND ADVANCED .7 DNX6 (GAUZE/BANDAGES/DRESSINGS) IMPLANT
DRAPE C-ARM 42X72 X-RAY (DRAPES) ×3 IMPLANT
DRAPE C-ARMOR (DRAPES) ×2 IMPLANT
DRAPE MICROSCOPE LEICA (MISCELLANEOUS) ×2 IMPLANT
DRAPE SURG 17X23 STRL (DRAPES) ×8 IMPLANT
DRSG MEPILEX BORDER 4X4 (GAUZE/BANDAGES/DRESSINGS) IMPLANT
DRSG MEPILEX BORDER 4X8 (GAUZE/BANDAGES/DRESSINGS) ×1 IMPLANT
DRSG XEROFORM 1X8 (GAUZE/BANDAGES/DRESSINGS) ×1 IMPLANT
DURAPREP 26ML APPLICATOR (WOUND CARE) ×2 IMPLANT
ELECT BLADE 6.5 EXT (BLADE) IMPLANT
ELECT CAUTERY BLADE 6.4 (BLADE) ×2 IMPLANT
ELECT REM PT RETURN 9FT ADLT (ELECTROSURGICAL) ×2
ELECTRODE REM PT RTRN 9FT ADLT (ELECTROSURGICAL) ×1 IMPLANT
EVACUATOR 1/8 PVC DRAIN (DRAIN) IMPLANT
GAUZE SPONGE 4X4 12PLY STRL LF (GAUZE/BANDAGES/DRESSINGS) ×1 IMPLANT
GLOVE SRG 8 PF TXTR STRL LF DI (GLOVE) ×1 IMPLANT
GLOVE SURG 8.5 LATEX PF (GLOVE) ×2 IMPLANT
GLOVE SURG LTX SZ9 (GLOVE) ×4 IMPLANT
GLOVE SURG ORTHO LTX SZ7.5 (GLOVE) ×3 IMPLANT
GLOVE SURG UNDER POLY LF SZ8 (GLOVE) ×2
GOWN STRL REUS W/ TWL LRG LVL3 (GOWN DISPOSABLE) ×1 IMPLANT
GOWN STRL REUS W/TWL 2XL LVL3 (GOWN DISPOSABLE) ×4 IMPLANT
GOWN STRL REUS W/TWL LRG LVL3 (GOWN DISPOSABLE) ×2
GRAFT BNE MATRIX VG FRMBL L 10 (Bone Implant) IMPLANT
GRAFT BNE MATRIX VG FRMBL MD 5 (Bone Implant) IMPLANT
KIT BASIN OR (CUSTOM PROCEDURE TRAY) ×2 IMPLANT
KIT GRAFTMAG DEL NEURO DISP (NEUROSURGERY SUPPLIES) ×1 IMPLANT
KIT POSITION SURG JACKSON T1 (MISCELLANEOUS) ×2 IMPLANT
KIT TURNOVER KIT B (KITS) ×2 IMPLANT
MANIFOLD NEPTUNE II (INSTRUMENTS) ×2 IMPLANT
NDL SPNL 18GX3.5 QUINCKE PK (NEEDLE) ×1 IMPLANT
NEEDLE 22X1 1/2 (OR ONLY) (NEEDLE) ×2 IMPLANT
NEEDLE SPNL 18GX3.5 QUINCKE PK (NEEDLE) ×2 IMPLANT
NS IRRIG 1000ML POUR BTL (IV SOLUTION) ×2 IMPLANT
PACK LAMINECTOMY ORTHO (CUSTOM PROCEDURE TRAY) ×2 IMPLANT
PAD ABD 7.5X8 STRL (GAUZE/BANDAGES/DRESSINGS) ×1 IMPLANT
PAD ARMBOARD 7.5X6 YLW CONV (MISCELLANEOUS) ×4 IMPLANT
PATTIES SURGICAL .75X.75 (GAUZE/BANDAGES/DRESSINGS) IMPLANT
PATTIES SURGICAL 1X1 (DISPOSABLE) ×2 IMPLANT
ROD 55MM (Rod) ×4 IMPLANT
ROD CREO MOD 6.5X50 (Rod) ×6 IMPLANT
ROD SPNL CVD 55X5.5XNS TI (Rod) IMPLANT
SCREW PA THRD CREO TULIP 5.5X4 (Head) ×6 IMPLANT
SCREW SD MOD CREO 6.5X40 (Screw) ×1 IMPLANT
SPONGE SURGIFOAM ABS GEL 100 (HEMOSTASIS) ×2 IMPLANT
SPONGE T-LAP 4X18 ~~LOC~~+RFID (SPONGE) ×3 IMPLANT
SURGIFLO W/THROMBIN 8M KIT (HEMOSTASIS) ×1 IMPLANT
SUT VIC AB 0 CT1 27 (SUTURE) ×2
SUT VIC AB 0 CT1 27XBRD ANBCTR (SUTURE) ×1 IMPLANT
SUT VIC AB 1 CTX 36 (SUTURE) ×4
SUT VIC AB 1 CTX36XBRD ANBCTR (SUTURE) ×2 IMPLANT
SUT VIC AB 2-0 CT1 27 (SUTURE) ×2
SUT VIC AB 2-0 CT1 TAPERPNT 27 (SUTURE) ×1 IMPLANT
SUT VIC AB 3-0 X1 27 (SUTURE) ×2 IMPLANT
SYR 20ML LL LF (SYRINGE) ×2 IMPLANT
SYR CONTROL 10ML LL (SYRINGE) ×4 IMPLANT
TAP BONE CORT 5.0/4.0 (BIT) ×1 IMPLANT
TAP SURG AMP CREO 4.5 (TAP) ×1 IMPLANT
TAP SURG AMP CREO 5.5 (TAP) ×1 IMPLANT
TAP SURG AMP CREO 6.5 (TAP) ×1 IMPLANT
TAPE CLOTH SURG 4X10 WHT LF (GAUZE/BANDAGES/DRESSINGS) ×1 IMPLANT
TOWEL GREEN STERILE (TOWEL DISPOSABLE) ×2 IMPLANT
TOWEL GREEN STERILE FF (TOWEL DISPOSABLE) ×2 IMPLANT
WATER STERILE IRR 1000ML POUR (IV SOLUTION) ×2 IMPLANT
YANKAUER SUCT BULB TIP NO VENT (SUCTIONS) ×2 IMPLANT

## 2021-10-30 NOTE — Progress Notes (Signed)
Patient states that she did not stop Ibuprofen and took 1000 mg yesterday.  Dr. Louanne Skye made aware and no new orders received.

## 2021-10-30 NOTE — Anesthesia Procedure Notes (Signed)
Arterial Line Insertion Performed by: Janace Litten, CRNA, CRNA  Preanesthetic checklist: patient identified, IV checked, monitors and equipment checked and pre-op evaluation Left, radial was placed Catheter size: 20 G Hand hygiene performed   Attempts: 1 Procedure performed without using ultrasound guided technique. Following insertion, dressing applied and Biopatch. Patient tolerated the procedure well with no immediate complications.

## 2021-10-30 NOTE — Discharge Instructions (Signed)

## 2021-10-30 NOTE — Brief Op Note (Signed)
10/30/2021  2:13 PM  PATIENT:  Kristin Gilmore  71 y.o. 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, severe lumbar degenerative disc disease, right L3 and L4 radiculopathy  POST-OPERATIVE DIAGNOSIS:  3-4 and L4-5 lumbar spondylolisthesis  spondylosis, severe lumbar degenerative disc disease, right L3 and L4 radiculopathy  PROCEDURE:  Procedure(s): RIGHT L3-4 AND L4-5 TRANSFORAMINAL LUMBAR INTERBODY FUSIONS WITH RODS, SCREWS, CAGES, LOCAL BONE GRAFT, ALLOGRAFT BONE GRAFT, VIVIGEN (N/A)  SURGEON:  Surgeon(s) and Role:    * Jessy Oto, MD - Primary  PHYSICIAN ASSISTANT: Benjiman Core, PA-C   ANESTHESIA:   local and general  EBL:  400 mL   BLOOD ADMINISTERED: 100 CC CELLSAVER  DRAINS: Urinary Catheter (Foley)   LOCAL MEDICATIONS USED:  MARCAINE 0.5%1:1 Exparel 1.3% Amount: 30 ml  SPECIMEN:  No Specimen  DISPOSITION OF SPECIMEN:  N/A  COUNTS:  YES  TOURNIQUET:  * No tourniquets in log *  DICTATION: .Dragon Dictation  PLAN OF CARE: Admit to inpatient   PATIENT DISPOSITION:  PACU - hemodynamically stable.   Delay start of Pharmacological VTE agent (>24hrs) due to surgical blood loss or risk of bleeding: yes

## 2021-10-30 NOTE — Op Note (Addendum)
10/30/2021  2:17 PM  PATIENT:  Kristin Gilmore  71 y.o. 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 Esperanza Richters, supplemented with local marcaine 0.5% 1:1 exparel 1.3% total 30cc.   EBL: 400CC  CELL SAVER BLOOD RETURNED: 100CC  DRAINS: Foley to SD.     COMPONENTS:   Implant Name Type Inv. Item Serial No. Manufacturer Lot No. LRB No. Used Action  ROD CREO MOD 6.5X50 - [id number] Rod ROD CREO MOD 6.5X50  [employer]   5 Implanted  ROD CREO MOD 6.5X50 - [id number] Rod ROD CREO MOD 6.5X50  [employer]   1 Implanted and Explanted  SCREW PA THRD CREO TULIP 5.5X4 - [id number] Head SCREW PA THRD CREO TULIP 5.5X4  [employer]   6 Implanted  SCREW PA THRD CREO TULIP 5.5X4 - [id number] Head SCREW PA THRD CREO TULIP 5.5X4  [employer]   1 Implanted and Explanted  CAP LOCKING THREADED - [id number] Cap CAP LOCKING THREADED  [employer]   6 Implanted  SCREW SD MOD CREO 6.5X40 - [id number] Screw SCREW SD MOD CREO 6.5X40  [employer]   1 Implanted  BONE VIVIGEN FORMABLE 10CC - [id number] Bone Implant BONE VIVIGEN FORMABLE 10CC [id number] [facility]   1 Implanted  CAGE SABLE 10X26 6-12 8D - [id number] Cage CAGE SABLE 10X26 6-12 8D  [employer] [id number]  1 Implanted  CAGE SABLE 10X26 6-12 8D - [id number] Cage CAGE SABLE  10X26 6-12 8D  [employer] [id number]  1 Implanted  BONE VIVIGEN FORMABLE 5.4CC - [id number] Bone Implant BONE VIVIGEN FORMABLE 5.4CC [id number] [facility]   1 Implanted  SCREW PA THRD CREO TULIP 5.5X4 - [id number] Head SCREW PA THRD CREO TULIP 5.5X4  [employer]  N/A 2 Wasted  ROD 55MM - [id number] Rod ROD 55MM  [employer]   2 Implanted  CAP LOCKING THREADED - [id number] Cap CAP LOCKING THREADED  [employer]  N/A 1 Implanted and Explanted    PROCEDURE: The patient was met in the holding area, and the appropriate lumbar levels right L4-5 and L3-4 identified and marked with an "X" and my initials. I had discussion with the patient in the preop holding area regarding a change of consent form.The fusion levels are reidentified as L4-5 and L3-4. Patient understands the rationale to perform TLIFs at two levels to decompress the right L4-5 and L5-S1 lateral recess and foramenal stenosis and to allow for some improved correction of her overall kyphoscoliosis. The patient was then transported to OR and was placed under general anestheticwithout difficulty. The patient received appropriate preoperative antibiotic prophylaxis Vancomycin 1000 mg for Penicillin alergy.  Nursing staff inserted a Foley catheter under sterile conditions. The patient was then turned to a prone position using the [place] spine frame. PAS. all pressure points well padded the arms at the side to 90 90. Standard prep with DuraPrep solution draped in  the usual manner from the lower dorsal spine the mid sacral segment. Iodine Vi-Drape was used and the old incision scar was marked. Time-out procedure was called and correct. Skin in the midline between L2 and S1 was then infiltrated with local anesthesia, marcaine 1/2% 1:1 exparel 1.3% total 30 cc used. Incision was then made  extending from L2-S1  through the skin and subcutaneous layers down to the patient's lumbodorsal fascia and spinous processes. The previous old incision  scar was elipsed.Using electrocautery carefully drilled bleeding and perform dissection of the muscle tissues of the preserving the facet capsule at the L2-3. Continuing the exposure out laterally to expose the lateral margin of the facet joint line at L2-3, L3-4 and L4-5 . Incision was carried in the midline down to the S1 level area bleeders controlled using electrocautery monopolar electrocautery.The posterior elements at S1 and L2 were identified and used to determine the level and depth of the exposure.   Cerebellar retractors then a  Viper self retaining retractor was used for the exposure of the posterior spine.  C-arm fluoroscopy was then brought into the field and using C-arm fluoroscopy then a hole made into the medial aspect of the pedicle of L3 observed in the pedicle using C arm at the 5 oclock position on the left L3 pedicle nerve probe initial entry was determined on fluoroscopy to be good position alignment so that a 4.5 mm tap was passed to 30 mm within the left L3 pedicle to a depth of nearly 50 mm observed on C-arm fluoroscopy to be beyond the midpoint of the lumbar vertebra and then position alignment within the left L3 pedicle this was then removed and the pedicle channel probed demonstrating patency no sign of rupture the cortex of the pedicle. Tapping with a 4.5 mm screw tap then 5.5 and 6.5 mm tap then 6.5 mm x 50 mm screw was placed on the left side at the L3 level. C-arm fluoroscopy was then brought into the field and using C-arm fluoroscopy then a hole made into the posterior medial aspect of the pedicle of right L3 observed in the pedicle using ball tipped nerve hook and hockey stick nerve probe initial entry was determined on fluoroscopy to be good position alignment so that 4.69m tap was then used to tap the right L3 pedicle to a depth of nearly 50 mm observed on C-arm fluoroscopy to be beyond the midpoint of the lumbar vertebra and then position alignment within the right L3 pedicle  this was then removed and the pedicle channel probed demonstrating patency no sign of rupture the cortex of the pedicle. Tapping with a 5.5 and 6.5 mm screw tap then 6.5 mm x 50 mm screw shank was placed on the right side at L3. C-arm fluoroscopy was then brought into the field and using C-arm fluoroscopy then a hole made into the posterior and medial aspect of the left pedicle of L4 observed in the pedicle using ball tipped nerve hook and hockey stick nerve probe initial entry was determined on fluoroscopy to be good position alignment so that a 4.590mtap was then used to tap the left L4 pedicle to a depth of nearly 50 mm observed on C-arm fluoroscopy to be beyond the posterior one third of the lumbar vertebra and good position alignment within the left L4 pedicle this was then removed and the pedicle channel probed demonstrating patency no sign of rupture the cortex of the pedicle. Tapping with a 5.5 and 6.5 mm screw tap  then 6.5 mm x 50 mm screw was placed on the left side at the L4 level. The pedicle channel of L4 on the left probed demonstrating patency no sign of rupture the cortex of the pedicle. Viper screw for fixation of this level was measured as 6.5 mm x 50 mm screw so  was placed on the left side at the L4 level. C-arm fluoroscopy was then brought into the field and using C-arm fluoroscopy then a hole made into the posterior and medial aspect of the right pedicle of L4 observed in the pedicle using ball tipped nerve hook and hockey stick nerve probe initial entry was determined on fluoroscopy to be good position alignment so that a 4.5 mm tap was then used to tap the right L4 pedicle to a depth of nearly 30 mm observed on C-arm fluoroscopy to be beyond the posterior one third of the lumbar vertebra and good position alignment within the right L4 pedicle this was then removed and the pedicle channel probed demonstrating patency no sign of rupture the cortex of the pedicle. Tapping with a 5.5 and 6.5 mm  screw then 6.17m x 50 mm screwshank was placed on the right side at the L4 level. The pedicle channel of L4 on the right probed demonstrating patency no sign of rupture the cortex of the pedicle. Viper screw for fixation of this level was measured as 6.5 mm x 50 mm screw shank placed on the right side at L4. C-arm fluoroscopy was then brought into the field and using C-arm fluoroscopy then the hole into the pedicle of left L5 was placed and observed with ball-tipped probe the L5 pedicle on this side was 6.5 mm x 40 mm. A 6.029mX 4048marefully passed down the center of the L5 pedicle to a depth of nearly 40 mm. Observed on C-arm fluoroscopy to be in good position alignment channel was probed with a ball-tipped probe ensure patency no sign of cortical disruption. Following tapping with a 5.5 mm tap and a 6.5 x 40 mm screw was placed on the left side pedicle at L5. C-arm fluoroscopy was used to localize the hole made in the medial aspect of the pedicle of L5 on the right localizing the pedicle within the spinal canal with nerve hook and hockey-stick nerve probe carefully passed down the center of the L5 pedicle to a depth of nearly 50 mm. Observed on C-arm fluoroscopy to be in good position alignment channel was probed with a ball-tipped probe ensure predicle screw  to be placed on the right side at the L5 level following decompression and TLIFs..C-arm fluoroscopy was used to localize the hole made in the lateral aspect of the pedicle of S1 on the right localizing the pedicle within the spinal canal with nerve hook and hockey-stick nerve probe re patency no sign of cortical disruption. Following tapping with a 4.5 mm, 5.5 mm and 6.5  mm taps a 6.5 x 50 mm screw shank was placed on the right side.  The right side decompression was carried out with complete facetectomies were perform on the right at L4-5 and L3-4 to provide for exposure of the right side L4-5 and L3-4 neuroforamen for ease of placement of TLIFs  (transforaminal lumbar interbody fusion) at each level inferior portions of the lamina and pars were also resected first beginning with the Leksell rongeur and osteotomes and then resecting using 2 and 3 mm Kerrison. Continued lateral laminectomy was carried out resecting the residual portions of the lamina  of L3, L4 and L5 performing foraminotomies on the right side at the L3-4 and L4-5 levels. The inferior articular process L4 and L3 were resected on the right side. The L5 nerve root identified bilaterally and the medial aspect of the L5 pedicle. Superior articular process of L5 was then resected from the right side further decompressing the right L5 nerve and providing for exposure of the area just superior to the L5 pedicle for a placement of L4-5 cage. 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 The right medial L3-4 facet was osteotomized and the  medial superior articular process of L4 was removed as a unit. A small band of scar tissure indented the right side of the posterior thecal sac, this was further freed from the underlying thecal sac and divided with a 2 mm kerrison.  Loupe magnification and headlight were used during this portion procedure. The operating room microscope brought into the field. Attention then turned to placement of the transforaminal lumbar interbody fusion cages. Using a Penfield 4 the right lateral aspect of the thecal sac at the L4-5 disc space was carefully freed up The thecal sac could then easily be retracted in the posterior lateral aspect of the L4-5 disc was exposed 15 blade scalpel used to incise the posterolateral disc and an osteotome used to resect a small portion of bone off the superior aspect of the posterior superior vertebral body of L5 in order to ease the entry into the L4-5 disc space. A  33m kerrison rongeur was then able to be introduced  in the disc space debrided it was quite narrow. 450mto 7 mm dilators were used to dialate the L4-5 disc space on the right side then  using small curettes and the disc space was debrided a minimal degenerative disc present in the endplates debrided to bleeding endplate bone. Shavers were inserted to trial the intervertebral disc space. A 7-12 mm x 2677mdjustable sable 8 degree lordotic cage was carefully packed with morcellized bone graft and the been harvested from previous laminotomies and vivigen.The cage was then inserted with the articulating insertion handle. The cage positioned anteriorly and then height adjusted.  Additional bone graft was then packed into the intervertebral disc space via the cannulated insertion handle. Bleeding controlled using bipolar electrocautery thrombin soaked gel cottonoids. Then turned to the right L3-4 level similarly the exposure the posterior lateral aspect this was carried out using a Penfield 4 bipolar electrocautery to control small bleeders present. Derricho retractor used to retract the thecal sac and L3 nerve root a 15 blade scalpel was used to incise posterior lateral aspect of the right L3-4 disc the disc space at this level showed a rather severe narrowing posteriorly was more open anteriorly so that an osteotome again was used to resect a small portion the posterior superior lip of the vertebral body at L4  in order to gain ease of access into the L3-4 disc space. The space was debrided of degenerative disc material using pituitary along root the entire disc space was then debrided of degenerative disc material using pituitary rongeurs curettage down to bleeding bone endplates. 4-7mm43malators were used to debride the disc space and pituitary ronguers used to remove the loosened debris. This space was then carefully assess using spacers  a 7.0 mm trial cage provided the best fit, the Globus 8 degree adjustable lordotic cage 7-12mm45m  16mwas chosen so that the  permanent 7-12 mmx 26 mm eight degreee lordotic Sable cage packed with local bone graft and vivigen was placed into the intervertebral disc space. The cage placed anteriorly and the height adjustment deployed. The posterior intervertebral disc space was then packed with additional bone graft via the cannulated insertion handle. Bleeding controlled using bipolar electrocautery.  Observed on C-arm fluoroscopy to be in good position alignment. The cages at L4-5 and L3-4 were placed anteriorly as best as possible the correct patient's kyphosis that was present. With this then the transforaminal lumbar interbody fusion portion of the case was completed bleeders were controlled using bipolar electrocautery thrombin-soaked Gelfoam were appropriate.Decortication of the left facet joints carried out at L4-5 and L3-4. These were packed with vivigen bone graft. 3 pedicle screws on the left and the right L3, L4 and L5 pedicle screw shanks already in place, the 3 creo screw tulips placed  on the right  The right side first quarter inch titanium rod was then carefully contoured using the french benders and a precontoured 563mrod. This was then placed into the pedicle screws on the right extending from L3-L5 each of the caps were carefully placed loosely tightened. Attention turned to the left side were similarly and then screws were carefully adjusted to allow for a better pattern screws to allow for placement of fixation of the rod a quarter inch 55 mm precontoured titanium rod was then carefully contoured. This was able to be inserted into the left pedicle screw fasteners, Caps onto the L3 fasteners were tightened to 80 foot lbs. Across the right side  L3-4 and L4-5 screw fasteners compression was obtained on the right side between L3 and L4, then L4 and L5 by compressing between the fasteners and tightening the screw caps 85 pounds. Similarly this was done on the left side at L3-4 and L4-5 obtaining compression and tightened  85 pounds. Irrigation was carried out with copious amounts of saline solution this was done throughout the case. Cell Saver was used during the case and a total of 100cc of autogenous blood was returned.  Hockey stick neuroprobe was used to probe the neuroforamen the left L3, L4 and L5, these were determined to be well decompressed. Permanent C-arm images were obtained in AP and lateral plane and oblique planes. Remaining local bone graft was then applied along the left lateral posterior lateral region extending from L3 to L5 facet beds.Gelfoam was then removed spinal canal. The lumbodorsal musculature carefully exam debrided of any devitalized tissue following removal of Viper retractors were the bleeders were controlled using electrocautery and the area dorsal lumbar muscle were then approximated in the midline with interrupted #1 Vicryl sutures loose the dorsal fascia was reattached to the spinous process of L2 to superiorly and L5  inferiorly this was done with #1 Vicryl sutures. Subcutaneous layers then approximated using interrupted 0 Vicryl sutures and 2-0 Vicryl sutures. Skin was closed with stainless steel staples then MedPlex bandage. All instrument and sponge counts were correct. The patient was then returned to a supine position on her bed reactivated extubated and returned to the recovery room in satisfactory condition.   JaBenjiman CoreA-C perform the duties of assistant surgeon during this case.He was present from the beginning of the case to the end of the case assisting in transfer the patient from his stretcher to the OR table and back to the stretcher at the end of the case. Assisted in careful retraction and suction of the  laminectomy site delicate neural structures operating under the operating room microscope. He performed closure of the incision from the fascia to the skin applying the dressing.         [name]  10/30/2021, 2:17 PM

## 2021-10-30 NOTE — Progress Notes (Signed)
Orthopedic Tech Progress Note Patient Details:  Kristin Gilmore [facility] 05-08-1951 [id number]  Ortho Devices Type of Ortho Device: Lumbar corsett Ortho Device/Splint Location: BACK Ortho Device/Splint Interventions: Ordered   Post Interventions Patient Tolerated: Poor, Other (comment) Instructions Provided: Care of device  Kristin Gilmore 10/30/2021, 5:36 PM

## 2021-10-30 NOTE — Transfer of Care (Signed)
Immediate Anesthesia Transfer of Care Note  Patient: Kristin Gilmore  Procedure(s) Performed: RIGHT L3-4 AND L4-5 TRANSFORAMINAL LUMBAR INTERBODY FUSIONS WITH RODS, SCREWS, CAGES, LOCAL BONE GRAFT, ALLOGRAFT BONE GRAFT, VIVIGEN  Patient Location: PACU  Anesthesia Type:General  Level of Consciousness: drowsy, patient cooperative and responds to stimulation  Airway & Oxygen Therapy: Patient Spontanous Breathing and Patient connected to nasal cannula oxygen  Post-op Assessment: Report given to RN and Post -op Vital signs reviewed and stable  Post vital signs: Reviewed and stable  Last Vitals:  Vitals Value Taken Time  BP 186/81 10/30/21 1434  Temp    Pulse 72 10/30/21 1436  Resp 20 10/30/21 1436  SpO2 100 % 10/30/21 1436  Vitals shown include unvalidated device data.  Last Pain:  Vitals:   10/30/21 0702  TempSrc:   PainSc: 0-No pain         Complications: No notable events documented.

## 2021-10-30 NOTE — Interval H&P Note (Signed)
History and Physical Interval Note:  10/30/2021 7:34 AM  Kristin Gilmore  has presented today for surgery, with the diagnosis of L3-4 and L4-5 lumbar spondylolisthesis with spondylosis, severe lumbar degenerative disc disease, right L3 and L4 radiculopathy.  The various methods of treatment have been discussed with the patient and family. After consideration of risks, benefits and other options for treatment, the patient has consented to  Procedure(s): RIGHT L3-4 AND L4-5 TRANSFORAMINAL LUMBAR INTERBODY FUSIONS WITH RODS, SCREWS, CAGES, LOCAL BONE GRAFT, ALLOGRAFT BONE GRAFT, VIVIGEN (N/A) as a surgical intervention.  The patient's history has been reviewed, patient examined, no change in status, stable for surgery.  I have reviewed the patient's chart and labs.  Questions were answered to the patient's satisfaction.     [name]

## 2021-10-30 NOTE — Anesthesia Procedure Notes (Signed)
Procedure Name: Intubation Date/Time: 10/30/2021 7:58 AM Performed by: Michele Rockers, CRNA Pre-anesthesia Checklist: Patient identified, Patient being monitored, Timeout performed, Emergency Drugs available and Suction available Patient Re-evaluated:Patient Re-evaluated prior to induction Oxygen Delivery Method: Circle system utilized Preoxygenation: Pre-oxygenation with 100% oxygen Induction Type: IV induction Ventilation: Mask ventilation without difficulty Laryngoscope Size: Miller and 2 Grade View: Grade I Tube type: Oral Tube size: 7.5 mm Number of attempts: 1 Airway Equipment and Method: Stylet Placement Confirmation: ETT inserted through vocal cords under direct vision, positive ETCO2 and breath sounds checked- equal and bilateral Secured at: 21 cm Tube secured with: Tape Dental Injury: Teeth and Oropharynx as per pre-operative assessment

## 2021-10-31 LAB — GLUCOSE, CAPILLARY
Glucose-Capillary: 97 mg/dL (ref 70–99)
Glucose-Capillary: 100 mg/dL — ABNORMAL HIGH (ref 70–99)
Glucose-Capillary: 44 mg/dL — CL (ref 70–99)
Glucose-Capillary: 92 mg/dL (ref 70–99)
Glucose-Capillary: 108 mg/dL — ABNORMAL HIGH (ref 70–99)

## 2021-10-31 LAB — CBC
HCT: 25.9 % — ABNORMAL LOW (ref 36.0–46.0)
Hemoglobin: 8.6 g/dL — ABNORMAL LOW (ref 12.0–15.0)
MCH: 30.4 pg (ref 26.0–34.0)
MCHC: 33.2 g/dL (ref 30.0–36.0)
MCV: 91.5 fL (ref 80.0–100.0)
Platelets: 187 10*3/uL (ref 150–400)
RBC: 2.83 MIL/uL — ABNORMAL LOW (ref 3.87–5.11)
RDW: 14.5 % (ref 11.5–15.5)
WBC: 7.7 10*3/uL (ref 4.0–10.5)
nRBC: 0 % (ref 0.0–0.2)

## 2021-10-31 LAB — BASIC METABOLIC PANEL
Anion gap: 5 (ref 5–15)
BUN: 21 mg/dL (ref 8–23)
CO2: 28 mmol/L (ref 22–32)
Calcium: 8.2 mg/dL — ABNORMAL LOW (ref 8.9–10.3)
Chloride: 102 mmol/L (ref 98–111)
Creatinine, Ser: 0.93 mg/dL (ref 0.44–1.00)
GFR, Estimated: >60 mL/min (ref >60)
Glucose, Bld: 100 mg/dL — ABNORMAL HIGH (ref 70–99)
Potassium: 3.8 mmol/L (ref 3.5–5.1)
Sodium: 135 mmol/L (ref 135–145)

## 2021-10-31 MED ORDER — SIMETHICONE 80 MG PO CHEW
160.0000 mg | CHEWABLE_TABLET | Freq: Four times a day (QID) | ORAL | Status: DC
  Administered 2021-10-31 – 2021-11-04 (×15): 160 mg via ORAL
  Filled 2021-10-31 (×15): qty 2

## 2021-10-31 MED ORDER — ALPRAZOLAM 0.25 MG PO TABS
0.2500 mg | ORAL_TABLET | Freq: Two times a day (BID) | ORAL | Status: DC | PRN
  Administered 2021-10-31 – 2021-11-04 (×5): 0.25 mg via ORAL
  Filled 2021-10-31 (×6): qty 1

## 2021-10-31 MED ORDER — INSULIN ASPART 100 UNIT/ML IJ SOLN: 0.0000 [IU] | Freq: Three times a day (TID) | INTRAMUSCULAR | Status: DC

## 2021-10-31 MED ORDER — GLUCOSE 4 G PO CHEW
CHEWABLE_TABLET | ORAL | Status: AC
  Filled 2021-10-31: qty 1

## 2021-10-31 NOTE — Progress Notes (Signed)
° ° ° °  Subjective: 1 Day Post-Op Procedure(s) (LRB): RIGHT L3-4 AND L4-5 TRANSFORAMINAL LUMBAR INTERBODY FUSIONS WITH RODS, SCREWS, CAGES, LOCAL BONE GRAFT, ALLOGRAFT BONE GRAFT, VIVIGEN (N/A) Awake, alert and oriented x 3. Some anxiety about husbands health. He does not want her to come home immediately post surgery but patient does not want to go home post surgery. Both have parkinson's and husband does not think he can take care of her.  Patient reports pain as moderate.    Objective:   VITALS:  Temp:  [97.7 F (36.5 C)-98.9 F (37.2 C)] 98.6 F (37 C) (02/01 0722) Pulse Rate:  [74-93] 89 (02/01 0722) Resp:  [13-20] 20 (02/01 0722) BP: (123-186)/(59-132) 125/66 (02/01 0722) SpO2:  [90 %-100 %] 95 % (02/01 0722)  Neurologically intact Neurovascular intact Sensation intact distally Intact pulses distally Dorsiflexion/Plantar flexion intact Incision: dressing C/D/I and no drainage Abdomen is bloated, non tender. Decreased BS no flatus.    LABS Recent Labs    10/31/21 0556  HGB 8.6*  WBC 7.7  PLT 187   Recent Labs    10/31/21 0556  NA 135  K 3.8  CL 102  CO2 28  BUN 21  CREATININE 0.93  GLUCOSE 100*   No results for input(s): LABPT, INR in the last 72 hours.   Assessment/Plan: 1 Day Post-Op Procedure(s) (LRB): RIGHT L3-4 AND L4-5 TRANSFORAMINAL LUMBAR INTERBODY FUSIONS WITH RODS, SCREWS, CAGES, LOCAL BONE GRAFT, ALLOGRAFT BONE GRAFT, VIVIGEN (N/A) Anemia due to surgery blood loss  Advance diet Up with therapy D/C foley Mobilize but needs to keep IV fluid due to ileus.  No reglan due to Parkinson's,  tends to increase symptoms.   [name] 10/31/2021, 11:07 AM Patient ID: Kristin Gilmore, female   DOB: [birth year], 71 y.o.   MRN: [id number]

## 2021-10-31 NOTE — Progress Notes (Signed)
Inpatient Diabetes Program Recommendations  AACE/ADA: New Consensus Statement on Inpatient Glycemic Control (2015)  Target Ranges:  Prepandial:   less than 140 mg/dL      Peak postprandial:   less than 180 mg/dL (1-2 hours)      Critically ill patients:  140 - 180 mg/dL   Lab Results  Component Value Date   GLUCAP 100 (H) 10/31/2021   HGBA1C 5.8 (H) 06/22/2021    Review of Glycemic Control  Latest Reference Range & Units 10/30/21 06:35 10/30/21 23:10 10/31/21 07:40 10/31/21 11:49 10/31/21 12:28  Glucose-Capillary 70 - 99 mg/dL 103 (H) 121 (H) 97 44 (LL) 100 (H)  (LL): Data is critically low (H): Data is abnormally high Diabetes history: PreDM Outpatient Diabetes medications: Amaryl 1 mg QA Current orders for Inpatient glycemic control: Novolog 0-15 units TID Decadron 10 mg x 1  Inpatient Diabetes Program Recommendations:   Noted hypoglycemia of 44 mg/dL, no insulin received. Consider decreasing correction to Novolog 0-6 units TID & HS.   Thanks, Bronson Curb, MSN, RNC-OB Diabetes Coordinator [phone] (8a-5p)

## 2021-10-31 NOTE — Evaluation (Signed)
Occupational Therapy Evaluation Patient Details Name: Kristin Gilmore MRN: [id number] DOB: 16-Mar-1951 Today's Date: 10/31/2021   History of Present Illness 71 y.o. female presents to [facility] hospital on 10/30/2021 for elective R L3-5 TLIF. PMH includes OA, depression, HTN, parkinson;s disease, pituitary microadenoma, PNA   Clinical Impression   Patient admitted for the procedure above.  PTA she lives with her spouse, they both have Parkinson's, and attempt to share the IADL responsibilities.  Pain and dizziness are the primary deficits.  Currently she needs a lot of encouragement, and up to [place] for transfers, and Mod A for lower body ADL from a sit/stand level.   OT to follow in the acute setting, and [place] OT can be considered for post acute.     Recommendations for follow up therapy are one component of a multi-disciplinary discharge planning process, led by the attending physician.  Recommendations may be updated based on patient status, additional functional criteria and insurance authorization.   Follow Up Recommendations  Home health OT    Assistance Recommended at Discharge Intermittent Supervision/Assistance  Patient can return home with the following      Functional Status Assessment  Patient has had a recent decline in their functional status and demonstrates the ability to make significant improvements in function in a reasonable and predictable amount of time.  Equipment Recommendations  BSC/3in1    Recommendations for Other Services       Precautions / Restrictions Precautions Precautions: Fall;Back Precaution Booklet Issued: No Required Braces or Orthoses: Spinal Brace Spinal Brace: Thoracolumbosacral orthotic;Applied in sitting position Restrictions Weight Bearing Restrictions: No      Mobility Bed Mobility Overal bed mobility: Needs Assistance Bed Mobility: Rolling, Sidelying to Sit, Sit to Sidelying Rolling: Min assist Sidelying to sit: Min assist     Sit to  sidelying: Min assist      Transfers                          Balance Overall balance assessment: Needs assistance Sitting-balance support: Single extremity supported, Bilateral upper extremity supported, Feet supported Sitting balance-Leahy Scale: Poor   Postural control: Posterior lean Standing balance support: Single extremity supported, Bilateral upper extremity supported Standing balance-Leahy Scale: Poor                             ADL either performed or assessed with clinical judgement   ADL       Grooming: Wash/dry hands;Wash/dry face;Set up;Sitting           Upper Body Dressing : Minimal assistance;Sitting   Lower Body Dressing: Maximal assistance;Sit to/from stand   Toilet Transfer: Moderate assistance;Stand-pivot;BSC/3in1                   Vision Baseline Vision/History: 1 Wears glasses Patient Visual Report: No change from baseline       Perception Perception Perception: Not tested   Praxis Praxis Praxis: Not tested    Pertinent Vitals/Pain Pain Assessment Pain Assessment: Faces Faces Pain Scale: Hurts whole lot Pain Location: back Pain Descriptors / Indicators: Sore Pain Intervention(s): Monitored during session     Hand Dominance Right   Extremity/Trunk Assessment Upper Extremity Assessment Upper Extremity Assessment: Overall WFL for tasks assessed   Lower Extremity Assessment Lower Extremity Assessment: Defer to PT evaluation   Cervical / Trunk Assessment Cervical / Trunk Assessment: Back Surgery   Communication Communication Communication: No difficulties  Cognition Arousal/Alertness: Awake/alert Behavior During Therapy: Anxious Overall Cognitive Status: Impaired/Different from baseline Area of Impairment: Attention                   Current Attention Level: Focused                                  Home Living Family/patient expects to be discharged to:: Private  residence Living Arrangements: Spouse/significant other Available Help at Discharge: Family;Available 24 hours/day;Neighbor Type of Home: House Home Access: Stairs to enter [employer] of Steps: 3 Entrance Stairs-Rails: Right;Left;Can reach both Home Layout: Two level;Able to live on main level with bedroom/bathroom Alternate Level Stairs-Number of Steps: stair lift   Bathroom Shower/Tub: Occupational psychologist: Standard     Home Equipment: Conservation officer, nature (2 wheels);Cane - single point;Shower seat;Wheelchair - manual;Grab bars - tub/shower          Prior Functioning/Environment Prior Level of Function : Needs assist           ADLs (physical): Dressing;IADLs            OT Problem List: Decreased activity tolerance;Decreased safety awareness;Decreased knowledge of use of DME or AE;Pain;Impaired balance (sitting and/or standing)      OT Treatment/Interventions: Self-care/ADL training;Balance training;Therapeutic activities;DME and/or AE instruction    OT Goals(Current goals can be found in the care plan section) Acute Rehab OT Goals Patient Stated Goal: Feel better OT Goal Formulation: With patient Time For Goal Achievement: 11/14/21 Potential to Achieve Goals: Good ADL Goals Pt Will Perform Grooming: with supervision;standing Pt Will Perform Upper Body Dressing: with supervision;sitting Pt Will Perform Lower Body Dressing: with supervision;sit to/from stand Pt Will Transfer to Toilet: with supervision;regular height toilet;ambulating Pt Will Perform Toileting - Clothing Manipulation and hygiene: with supervision;sit to/from stand  OT Frequency: Min 2X/week    Co-evaluation              AM-PAC OT "6 Clicks" Daily Activity     Outcome Measure Help from another person eating meals?: A Little Help from another person taking care of personal grooming?: A Little Help from another person toileting, which includes using toliet, bedpan, or  urinal?: A Lot Help from another person bathing (including washing, rinsing, drying)?: A Lot Help from another person to put on and taking off regular upper body clothing?: A Little Help from another person to put on and taking off regular lower body clothing?: A Lot 6 Click Score: 15   End of Session Equipment Utilized During Treatment: Rolling walker (2 wheels)  Activity Tolerance: Patient limited by pain Patient left: in bed;with call bell/phone within reach;with family/visitor present  OT Visit Diagnosis: Unsteadiness on feet (R26.81)                Time: 1711-1735 OT Time Calculation (min): 24 min Charges:  OT General Charges $OT Visit: 1 Visit OT Evaluation $OT Eval Moderate Complexity: 1 Mod OT Treatments $Therapeutic Activity: 8-22 mins  10/31/2021  RP, OTR/L  Acute Rehabilitation Services  Office:  [phone]   Metta Clines 10/31/2021, 5:45 PM

## 2021-10-31 NOTE — Evaluation (Signed)
Physical Therapy Evaluation Patient Details Name: Kristin Gilmore MRN: [id number] DOB: Jun 02, 1951 Today's Date: 10/31/2021  History of Present Illness  71 y.o. female presents to [facility] hospital on 10/30/2021 for elective R L3-5 TLIF. PMH includes OA, depression, HTN, parkinson;s disease, pituitary microadenoma, PNA  Clinical Impression  Pt presents to PT with significant anxiety and dizziness, limiting her ability to mobilize during this session. Pt benefits from minA for tactile cueing during bed mobility, and is unable to progress to transfer training due to reports of dizziness. Pt will benefit from aggressive mobilization in an effort to improve mobility quality and to reduce falls risk. PT is hopeful for progression of mobility next session, however may need to consider inpatient placement in the patient remains limited.     Recommendations for follow up therapy are one component of a multi-disciplinary discharge planning process, led by the attending physician.  Recommendations may be updated based on patient status, additional functional criteria and insurance authorization.  Follow Up Recommendations Home health PT (hopeful for progression with reduced anxiety)    Assistance Recommended at Discharge Intermittent Supervision/Assistance  Patient can return home with the following  A little help with walking and/or transfers;A little help with bathing/dressing/bathroom;Assistance with cooking/housework;Help with stairs or ramp for entrance;Assist for transportation    Equipment Recommendations BSC/3in1  Recommendations for Other Services       Functional Status Assessment Patient has had a recent decline in their functional status and demonstrates the ability to make significant improvements in function in a reasonable and predictable amount of time.     Precautions / Restrictions Precautions Precautions: Fall;Back Precaution Booklet Issued: No (verbally reviewed back  precautions) Required Braces or Orthoses: Spinal Brace Spinal Brace:  (unable to don brace as pt reports significant dizziness during all sitting attempts, unable to sit long enough to don brace) Restrictions Weight Bearing Restrictions: No      Mobility  Bed Mobility Overal bed mobility: Needs Assistance Bed Mobility: Rolling, Sidelying to Sit, Sit to Sidelying Rolling: Min assist Sidelying to sit: Min assist     Sit to sidelying: Min assist General bed mobility comments: pt requires verbal cues for log roll technique. Pt rolls and sits at side of bed 3 times during session, having to lay down within 1 minute of sitting each time due to dizziness    Transfers Overall transfer level:  (pt declines attempts due to reports of dizziness)                      Ambulation/Gait                  Stairs            Wheelchair Mobility    Modified Rankin (Stroke Patients Only)       Balance Overall balance assessment: Needs assistance Sitting-balance support: Single extremity supported, Bilateral upper extremity supported, Feet supported Sitting balance-Leahy Scale: Poor Sitting balance - Comments: minA, posterior lean initially, improves to minG Postural control: Posterior lean                                   Pertinent Vitals/Pain Pain Assessment Pain Assessment: Faces Faces Pain Scale: Hurts even more Pain Location: back Pain Descriptors / Indicators: Sore Pain Intervention(s): Monitored during session    Home Living Family/patient expects to be discharged to:: Private residence Living Arrangements: Spouse/significant other Available Help at Discharge: Family;Available  24 hours/day;Neighbor Type of Home: House Home Access: Stairs to enter Entrance Stairs-Rails: Right;Left;Can reach both Entrance Stairs-Number of Steps: 3 Alternate Level Stairs-Number of Steps: stair lift Home Layout: Two level;Able to live on main level with  bedroom/bathroom Home Equipment: Rolling Walker (2 wheels);Cane - single point;Shower seat;Wheelchair - manual;Grab bars - tub/shower Additional Comments: pt's spouse has Parkinsons disease and has a history of falls. Pt reports her spouse assists her with activities which require bending including putting dishes into the dishwasher    Prior Function Prior Level of Function : Needs assist       Physical Assist : ADLs (physical)   ADLs (physical): Dressing;IADLs Mobility Comments: pt ambulates with RW in the home, utilizes a cane to get into the car       Hand Dominance        Extremity/Trunk Assessment   Upper Extremity Assessment Upper Extremity Assessment: Overall WFL for tasks assessed    Lower Extremity Assessment Lower Extremity Assessment: Generalized weakness    Cervical / Trunk Assessment Cervical / Trunk Assessment: Back Surgery  Communication   Communication: No difficulties  Cognition Arousal/Alertness: Awake/alert Behavior During Therapy: Anxious Overall Cognitive Status: Impaired/Different from baseline Area of Impairment: Attention                   Current Attention Level: Focused           General Comments: pt with difficulty remaining focused on task at hand, appears very anxious during session with mind jumping from once concern to the next, not allowing PT or RN to resolve each concern as they arise        General Comments General comments (skin integrity, edema, etc.): VSS on RA, pt reports dizziness with all attempts at sitting upright, BP stable at 140s/70s.    Exercises     Assessment/Plan    PT Assessment Patient needs continued PT services  PT Problem List Decreased activity tolerance;Decreased balance;Decreased mobility;Pain;Decreased strength       PT Treatment Interventions DME instruction;Gait training;Stair training;Functional mobility training;Therapeutic activities;Therapeutic exercise;Balance training;Neuromuscular  re-education;Patient/family education    PT Goals (Current goals can be found in the Care Plan section)  Acute Rehab PT Goals Patient Stated Goal: to reduce dizziness with mobility PT Goal Formulation: With patient Time For Goal Achievement: 11/14/21 Potential to Achieve Goals: Fair    Frequency Min 5X/week     Co-evaluation               AM-PAC PT "6 Clicks" Mobility  Outcome Measure Help needed turning from your back to your side while in a flat bed without using bedrails?: A Little Help needed moving from lying on your back to sitting on the side of a flat bed without using bedrails?: A Little Help needed moving to and from a bed to a chair (including a wheelchair)?: Total Help needed standing up from a chair using your arms (e.g., wheelchair or bedside chair)?: Total Help needed to walk in hospital room?: Total Help needed climbing 3-5 steps with a railing? : Total 6 Click Score: 10    End of Session   Activity Tolerance: Other (comment) (limited by anxiety) Patient left: in bed;with call bell/phone within reach;with bed alarm set Nurse Communication: Mobility status PT Visit Diagnosis: Other abnormalities of gait and mobility (R26.89);Dizziness and giddiness (R42)    Time: 9357-0177 PT Time Calculation (min) (ACUTE ONLY): 30 min   Charges:   PT Evaluation $PT Eval Low Complexity: 1 Low PT Treatments $  Therapeutic Activity: 8-22 mins        Zenaida Niece, PT, DPT Acute Rehabilitation Pager: [phone] Office [place] 10/31/2021, 10:52 AM

## 2021-10-31 NOTE — TOC Progression Note (Addendum)
Transition of Care [facility]) - Progression Note    Patient Details  Name: Kristin Gilmore MRN: [id number] Date of Birth: 11-18-1950  Transition of Care [facility]) CM/SW [place], RN Phone Number:[phone]  10/31/2021, 3:58 PM  Clinical Narrative:    CM acknowledges TOC consult. Patient has [place] [facility] and DME 3in 1/ BSC recommendations. Cm at bedside to discuss with patient but patient is sleeping soundly. CM will discuss with patient in am. MD will need to enter [facility] orders Please.         Expected Discharge Plan and Services                                                 Social Determinants of Health (SDOH) Interventions    Readmission Risk Interventions No flowsheet data found.

## 2021-10-31 NOTE — Anesthesia Postprocedure Evaluation (Signed)
Anesthesia Post Note  Patient: Kristin Gilmore  Procedure(s) Performed: RIGHT L3-4 AND L4-5 TRANSFORAMINAL LUMBAR INTERBODY FUSIONS WITH RODS, SCREWS, CAGES, LOCAL BONE GRAFT, ALLOGRAFT BONE GRAFT, VIVIGEN     Patient location during evaluation: PACU Anesthesia Type: General Level of consciousness: awake and alert Pain management: pain level controlled Vital Signs Assessment: post-procedure vital signs reviewed and stable Respiratory status: spontaneous breathing, nonlabored ventilation and respiratory function stable Cardiovascular status: blood pressure returned to baseline Postop Assessment: no apparent nausea or vomiting Anesthetic complications: no   No notable events documented.             Marthenia Rolling

## 2021-10-31 NOTE — Progress Notes (Signed)
°  Transition of Care [facility]) Screening Note   Patient Details  Name: Kristin Gilmore Date of Birth: January 15, 1951   Transition of Care [facility]) CM/SW Contact:    Vinie Sill, LCSW Phone Number: 10/31/2021, 10:31 AM    Transition of Care Department [facility]) has reviewed patient and is following for disposition needs.

## 2021-11-01 ENCOUNTER — Inpatient Hospital Stay (HOSPITAL_COMMUNITY): Payer: Medicare Other

## 2021-11-01 LAB — CBC WITH DIFFERENTIAL/PLATELET
Abs Immature Granulocytes: 0.03 10*3/uL (ref 0.00–0.07)
Basophils Absolute: 0 10*3/uL (ref 0.0–0.1)
Basophils Relative: 0 %
Eosinophils Absolute: 0.4 10*3/uL (ref 0.0–0.5)
Eosinophils Relative: 4 %
HCT: 25.5 % — ABNORMAL LOW (ref 36.0–46.0)
Hemoglobin: 8.5 g/dL — ABNORMAL LOW (ref 12.0–15.0)
Immature Granulocytes: 0 %
Lymphocytes Relative: 23 %
Lymphs Abs: 1.9 10*3/uL (ref 0.7–4.0)
MCH: 31 pg (ref 26.0–34.0)
MCHC: 33.3 g/dL (ref 30.0–36.0)
MCV: 93.1 fL (ref 80.0–100.0)
Monocytes Absolute: 1 10*3/uL (ref 0.1–1.0)
Monocytes Relative: 12 %
Neutro Abs: 5.1 10*3/uL (ref 1.7–7.7)
Neutrophils Relative %: 61 %
Platelets: 179 10*3/uL (ref 150–400)
RBC: 2.74 MIL/uL — ABNORMAL LOW (ref 3.87–5.11)
RDW: 14.4 % (ref 11.5–15.5)
WBC: 8.4 10*3/uL (ref 4.0–10.5)
nRBC: 0 % (ref 0.0–0.2)

## 2021-11-01 LAB — BASIC METABOLIC PANEL
Anion gap: 6 (ref 5–15)
BUN: 16 mg/dL (ref 8–23)
CO2: 27 mmol/L (ref 22–32)
Calcium: 8.3 mg/dL — ABNORMAL LOW (ref 8.9–10.3)
Chloride: 103 mmol/L (ref 98–111)
Creatinine, Ser: 0.9 mg/dL (ref 0.44–1.00)
GFR, Estimated: >60 mL/min (ref >60)
Glucose, Bld: 94 mg/dL (ref 70–99)
Potassium: 4.3 mmol/L (ref 3.5–5.1)
Sodium: 136 mmol/L (ref 135–145)

## 2021-11-01 LAB — GLUCOSE, CAPILLARY
Glucose-Capillary: 82 mg/dL (ref 70–99)
Glucose-Capillary: 111 mg/dL — ABNORMAL HIGH (ref 70–99)

## 2021-11-01 IMAGING — DX DG ABDOMEN 1V
2 series · 2 of 2 positions shown · non-contrast
Comparison: 07/16/2019

CLINICAL DATA: Abdominal pain, recent lumbar surgery

EXAM:
ABDOMEN - 1 VIEW

[abdomen supine (1 of 2)]
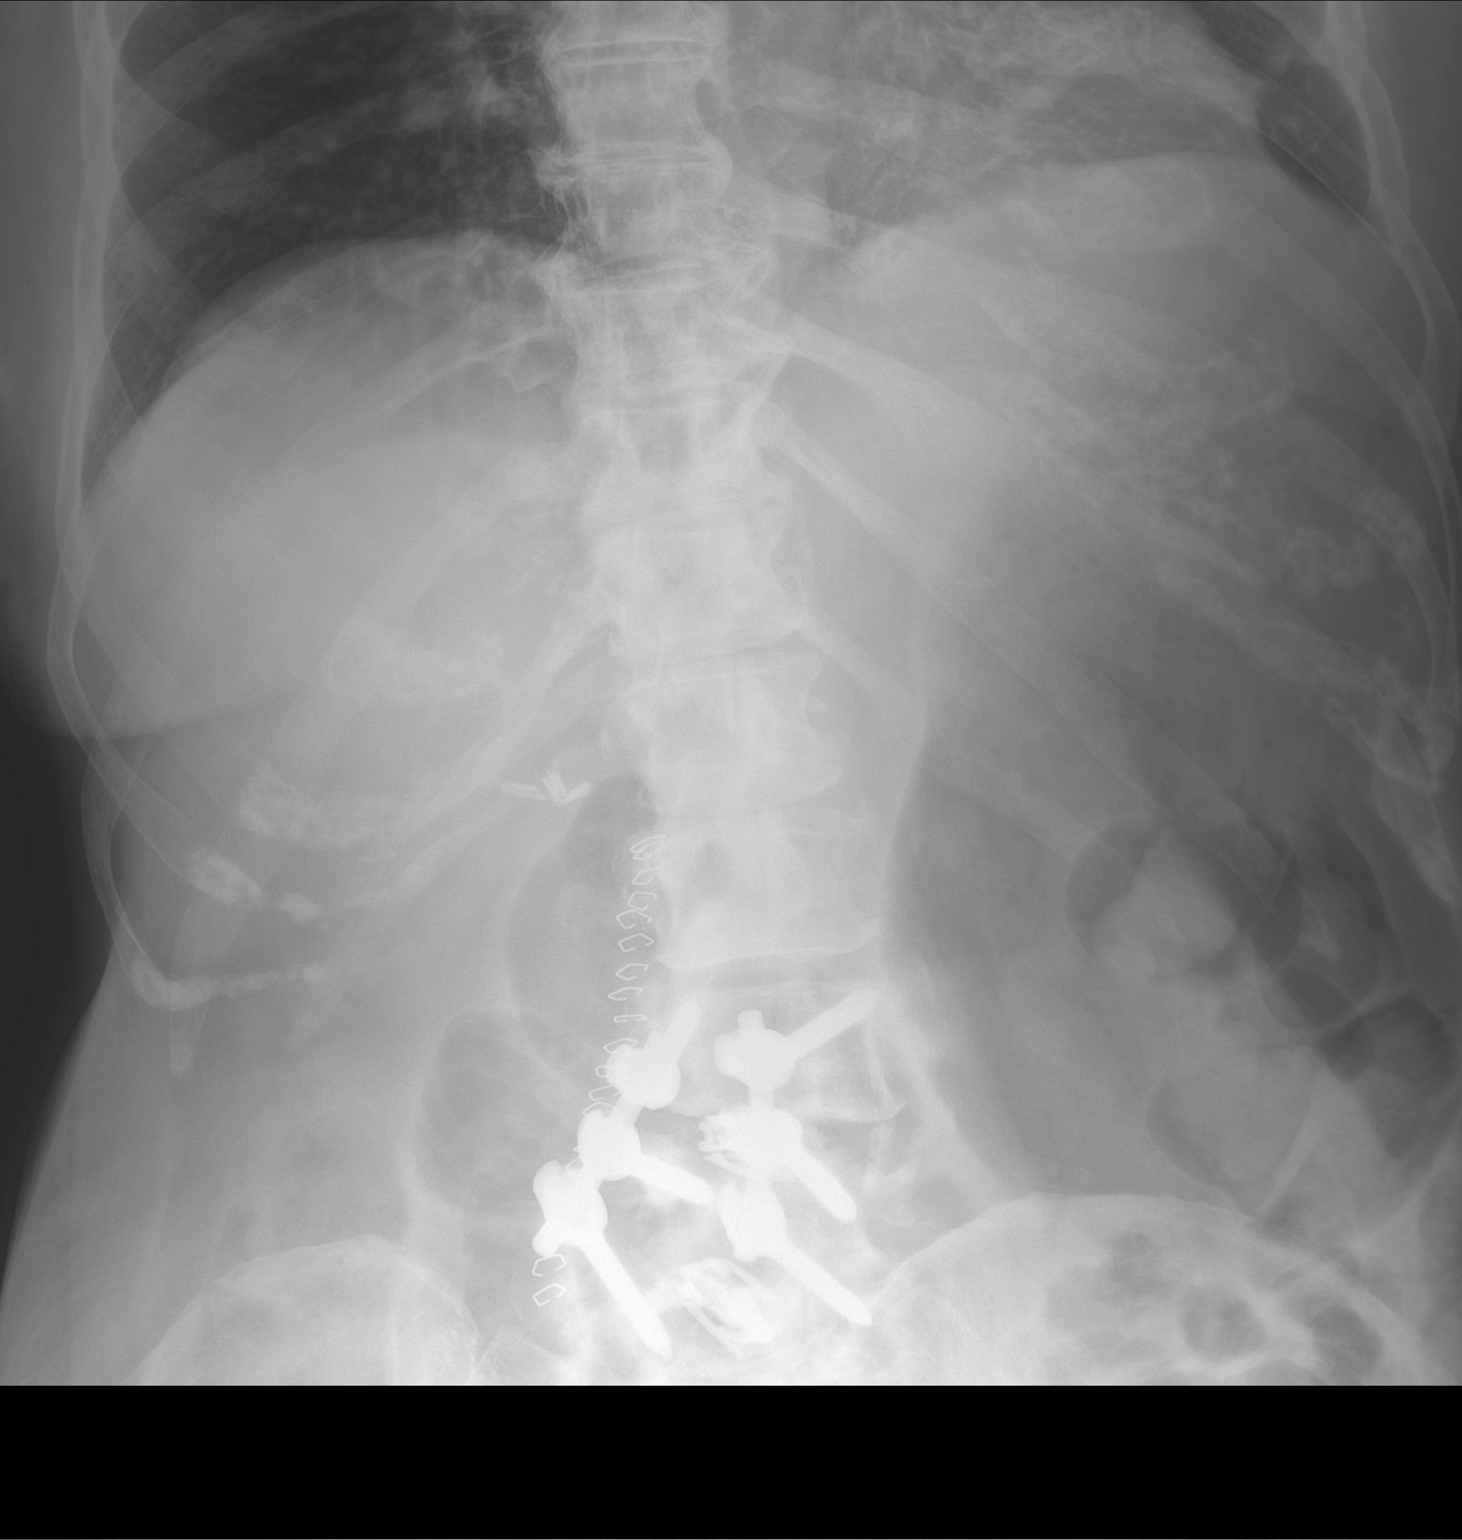

[abdomen supine (2 of 2)]
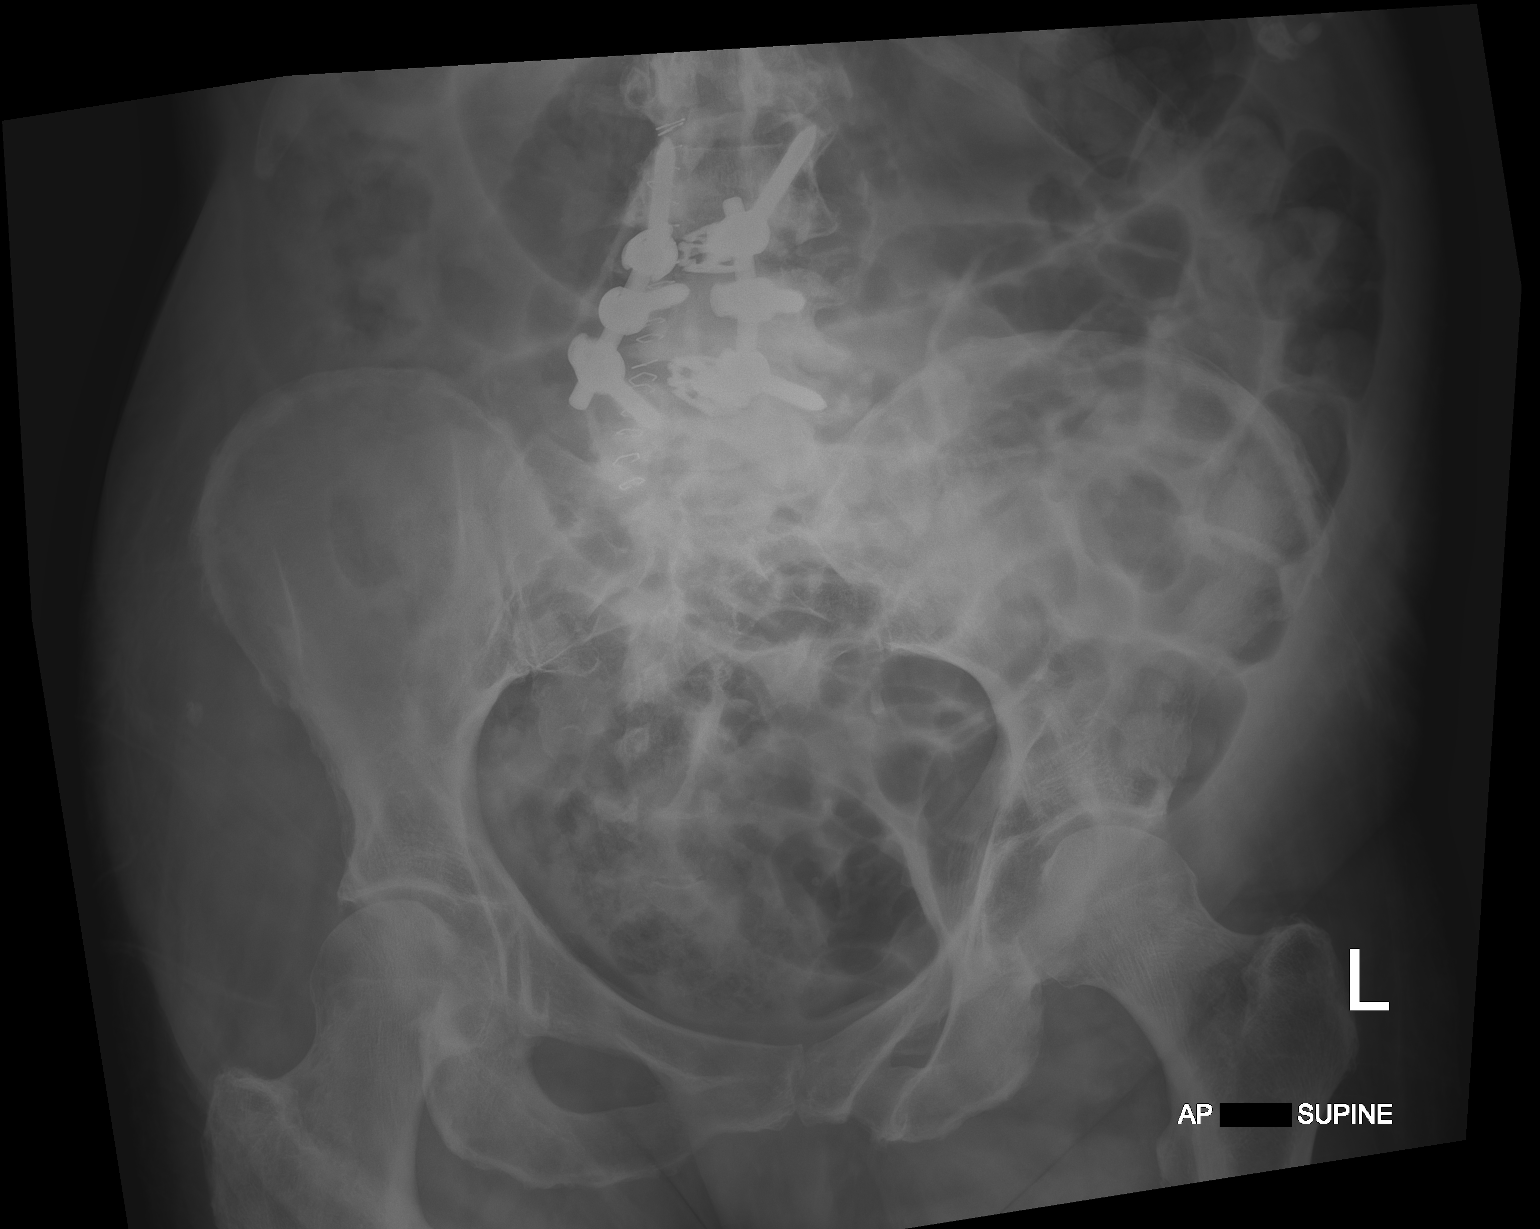

[2 of 2 positions shown; findings below may reference images not displayed]

FINDINGS: 2 supine frontal views of the abdomen and pelvis are obtained. There
is prominent gaseous distention of the stomach. No bowel obstruction
or ileus. No masses or abnormal calcifications. Postsurgical changes
are seen from lower lumbar discectomy and posterior fusion.
IMPRESSION: 1. Prominent gaseous distention of the stomach.
2. No evidence of bowel obstruction or ileus.

These results were discussed by telephone at the time of
interpretation on 11/01/2021 at [DATE] to provider NOSA TIGER , who
verbally acknowledged these results.

## 2021-11-01 IMAGING — DX DG CHEST 1V PORT
1 series · 1 of 1 positions shown · non-contrast
Comparison: 10/24/2021

CLINICAL DATA: Abdominal pain

EXAM:
PORTABLE CHEST 1 VIEW

[chest ap]
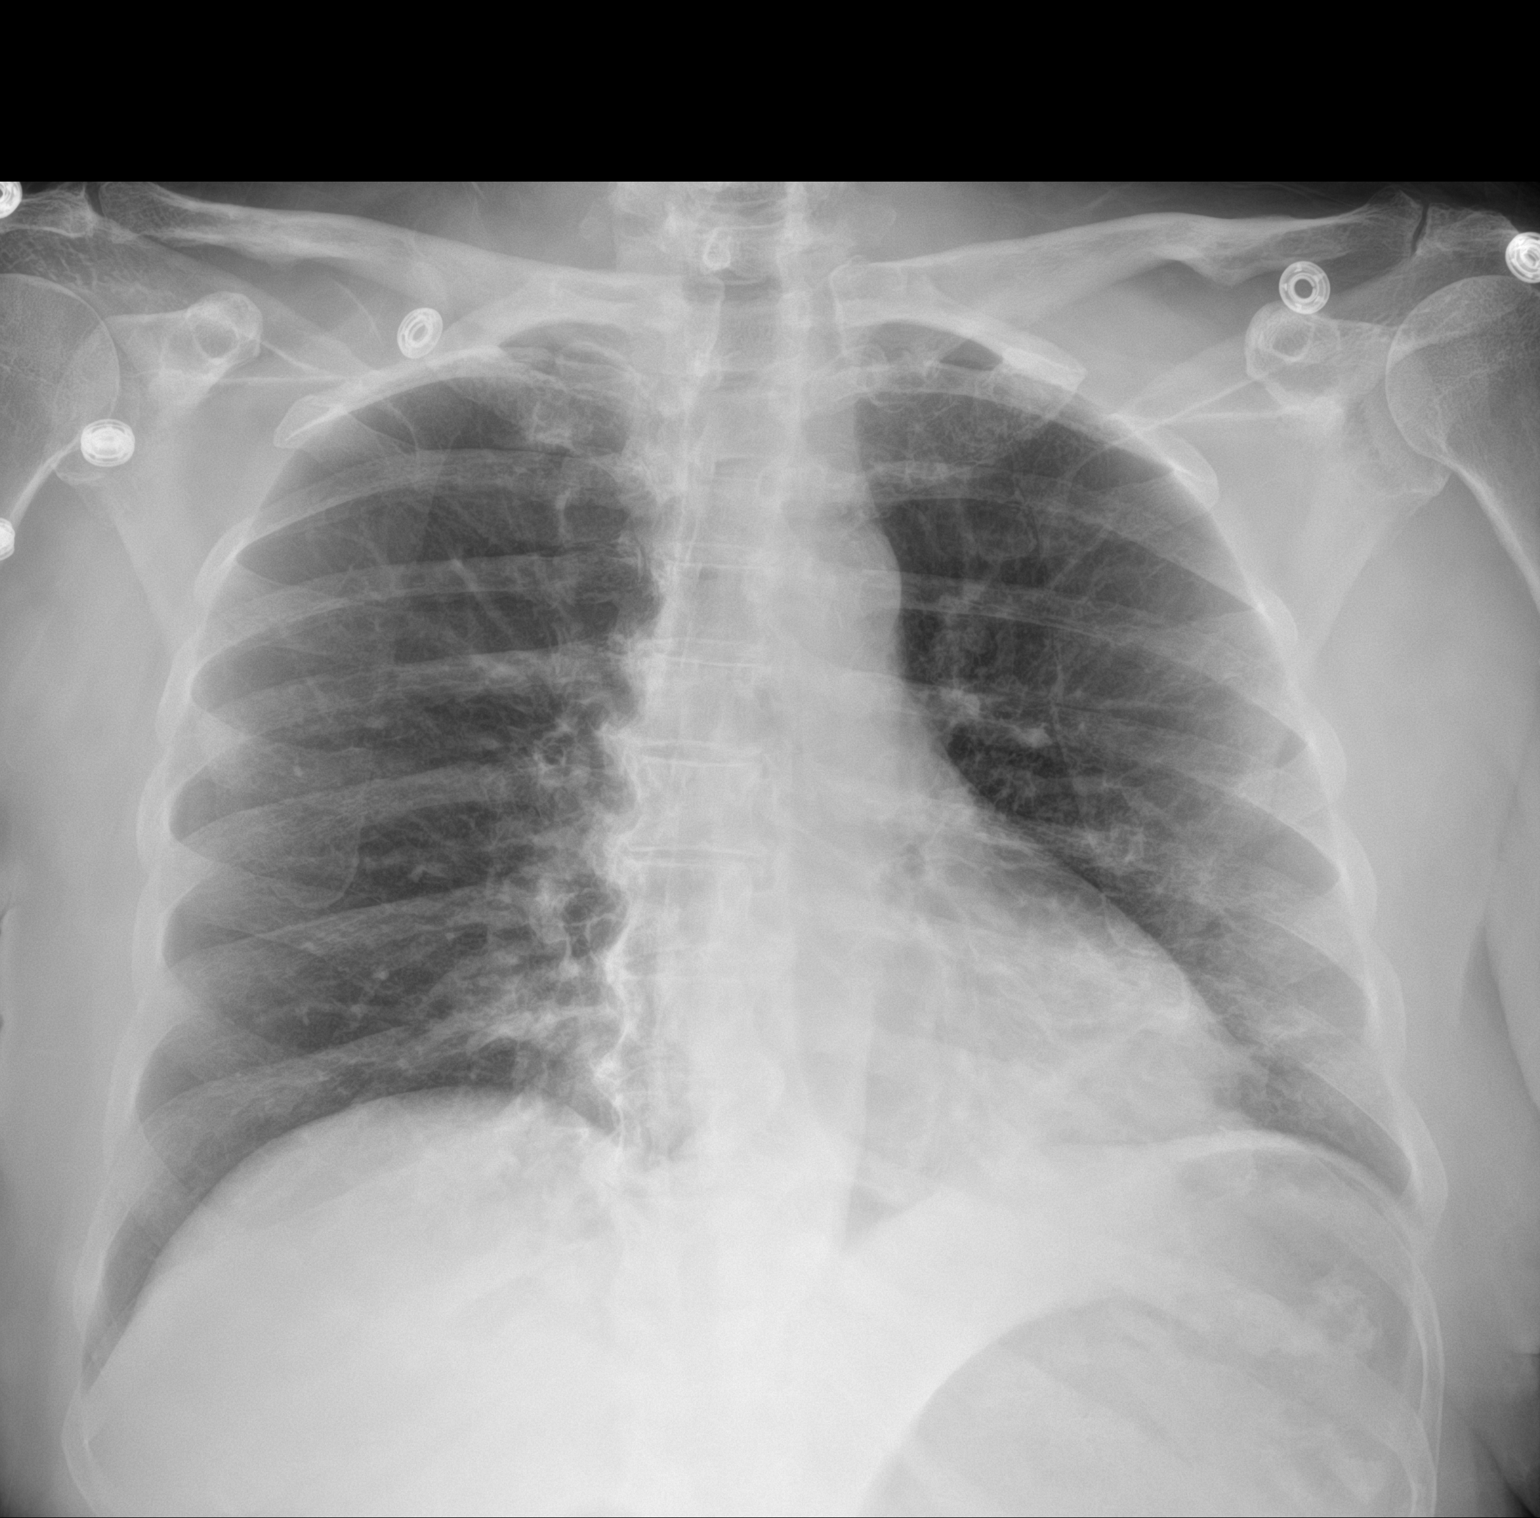

[1 of 1 positions shown; findings below may reference images not displayed]

FINDINGS: Single frontal view of the chest demonstrates an unremarkable
cardiac silhouette. Linear opacity in the retrocardiac region
consistent with hypoventilatory change and subsegmental atelectasis.
No airspace disease, effusion, or pneumothorax. No acute bony
abnormalities.
IMPRESSION: 1. Subsegmental atelectasis at the left lung base. No acute airspace
disease.

## 2021-11-01 MED ORDER — POLYETHYLENE GLYCOL 3350 17 G PO PACK
17.0000 g | PACK | Freq: Every day | ORAL | Status: DC
  Administered 2021-11-02 – 2021-11-04 (×3): 17 g via ORAL
  Filled 2021-11-01 (×3): qty 1

## 2021-11-01 MED FILL — Thrombin (Recombinant) For Soln 20000 Unit: CUTANEOUS | Qty: 1 | Status: AC

## 2021-11-01 NOTE — Progress Notes (Signed)
° ° ° °  Subjective: 2 Days Post-Op Procedure(s) (LRB): RIGHT L3-4 AND L4-5 TRANSFORAMINAL LUMBAR INTERBODY FUSIONS WITH RODS, SCREWS, CAGES, LOCAL BONE GRAFT, ALLOGRAFT BONE GRAFT, VIVIGEN (N/A) Alert, oriented x 4, seen at 1 pm, no abdomenal pain at 1 pm but then abdomenal pain. Foley to SD Patient reports pain as moderate.    Objective:   VITALS:  Temp:  [98 F (36.7 C)-99.8 F (37.7 C)] 98.9 F (37.2 C) (02/02 0746) Pulse Rate:  [78-90] 90 (02/02 0746) Resp:  [17-20] 17 (02/02 0746) BP: (105-187)/(60-101) 123/60 (02/02 0746) SpO2:  [90 %-93 %] 90 % (02/02 0746)  Neurologically intact Neurovascular intact Sensation intact distally Intact pulses distally Dorsiflexion/Plantar flexion intact Incision: dressing C/D/I, no drainage, and new dressing applied. No cellulitis present Compartment soft   LABS Recent Labs    10/31/21 0556  HGB 8.6*  WBC 7.7  PLT 187   Recent Labs    10/31/21 0556  NA 135  K 3.8  CL 102  CO2 28  BUN 21  CREATININE 0.93  GLUCOSE 100*   No results for input(s): LABPT, INR in the last 72 hours.   Assessment/Plan: 2 Days Post-Op Procedure(s) (LRB): RIGHT L3-4 AND L4-5 TRANSFORAMINAL LUMBAR INTERBODY FUSIONS WITH RODS, SCREWS, CAGES, LOCAL BONE GRAFT, ALLOGRAFT BONE GRAFT, VIVIGEN (N/A) Anemia due to blood Acute abdomenal pain, KUB shows distended stomach, she reports acute pain is gone now.  Advance diet Up with therapy D/C IV fluids  [name] 11/01/2021, 10:54 AM Patient ID: Windy Fast, female   DOB: [birth year], 71 y.o.   MRN: [id number]

## 2021-11-01 NOTE — Progress Notes (Signed)
Physical Therapy Treatment Patient Details Name: Kristin Gilmore MRN: [id number] DOB: 1950/11/04 Today's Date: 11/01/2021   History of Present Illness 71 y.o. female presents to [facility] hospital on 10/30/2021 for elective R L3-5 TLIF. PMH includes OA, depression, HTN, parkinson;s disease, pituitary microadenoma, PNA    PT Comments    Pt progressing slowly with mobility, able to transfer out of bed to recliner. Pt often demonstrating posterior lean during mobility and requires physical assistance to maintain balance and to perform all functional mobility. Pt remains very anxious and reports dizziness which limits her tolerance for mobility, dizziness seems to subside once seated in recliner. PT and pt discuss the need for increased mobility and time out of bed in order to progress. If pt does not progress to ambulation in the room next session then SNF placement will need to be considered as her spouse also has mobility difficulties.  Recommendations for follow up therapy are one component of a multi-disciplinary discharge planning process, led by the attending physician.  Recommendations may be updated based on patient status, additional functional criteria and insurance authorization.  Follow Up Recommendations  Home health PT (if pt does not progress tomorrow will likely need to consider SNF placement)     Assistance Recommended at Discharge Intermittent Supervision/Assistance  Patient can return home with the following A little help with walking and/or transfers;A little help with bathing/dressing/bathroom;Assistance with cooking/housework;Assist for transportation;Help with stairs or ramp for entrance   Equipment Recommendations  BSC/3in1    Recommendations for Other Services       Precautions / Restrictions Precautions Precautions: Fall;Back Precaution Booklet Issued: No Required Braces or Orthoses: Spinal Brace Spinal Brace: Lumbar corset;Applied in sitting  position Restrictions Weight Bearing Restrictions: No     Mobility  Bed Mobility Overal bed mobility: Needs Assistance Bed Mobility: Rolling, Sidelying to Sit Rolling: Min assist Sidelying to sit: Min assist            Transfers Overall transfer level: Needs assistance Equipment used: Rolling walker (2 wheels) Transfers: Sit to/from Stand Sit to Stand: Min assist           General transfer comment: PT providing cues for trunk flexion, minA to power up    Ambulation/Gait Ambulation/Gait assistance: Min assist Gait Distance (Feet): 3 Feet Assistive device: Rolling walker (2 wheels) Gait Pattern/deviations: Step-to pattern Gait velocity: reduced Gait velocity interpretation: <1.31 ft/sec, indicative of household ambulator   General Gait Details: pt with slowed step-to gait, mild posterior lean. PT with physical assist to safely transfer to chair as pt beginning to sit prior to reaching chair with BLE   Stairs             Wheelchair Mobility    Modified Rankin (Stroke Patients Only)       Balance Overall balance assessment: Needs assistance Sitting-balance support: Single extremity supported, Bilateral upper extremity supported Sitting balance-Leahy Scale: Poor Sitting balance - Comments: minA, posterior lean Postural control: Posterior lean Standing balance support: Bilateral upper extremity supported, During functional activity, Reliant on assistive device for balance Standing balance-Leahy Scale: Poor                              Cognition Arousal/Alertness: Awake/alert Behavior During Therapy: Anxious Overall Cognitive Status: Impaired/Different from baseline Area of Impairment: Memory, Safety/judgement, Awareness                     Memory: Decreased recall of precautions  Safety/Judgement: Decreased awareness of safety, Decreased awareness of deficits Awareness: Emergent            Exercises      General  Comments General comments (skin integrity, edema, etc.): VSS on RA, pt continues to report dizziness with sitting at edge of bed, reports of symptoms decrease once seated in recliner      Pertinent Vitals/Pain Pain Assessment Pain Assessment: Faces Faces Pain Scale: Hurts even more Pain Location: back Pain Descriptors / Indicators: Sore Pain Intervention(s): Monitored during session    Home Living                          Prior Function            PT Goals (current goals can now be found in the care plan section) Acute Rehab PT Goals Patient Stated Goal: to reduce dizziness with mobility Progress towards PT goals: Progressing toward goals (slowly)    Frequency    Min 5X/week      PT Plan Current plan remains appropriate    Co-evaluation              AM-PAC PT "6 Clicks" Mobility   Outcome Measure  Help needed turning from your back to your side while in a flat bed without using bedrails?: A Little Help needed moving from lying on your back to sitting on the side of a flat bed without using bedrails?: A Little Help needed moving to and from a bed to a chair (including a wheelchair)?: A Little Help needed standing up from a chair using your arms (e.g., wheelchair or bedside chair)?: A Little Help needed to walk in hospital room?: Total Help needed climbing 3-5 steps with a railing? : Total 6 Click Score: 14    End of Session   Activity Tolerance: Patient tolerated treatment well Patient left: in chair;with call bell/phone within reach;with chair alarm set Nurse Communication: Mobility status PT Visit Diagnosis: Other abnormalities of gait and mobility (R26.89);Dizziness and giddiness (R42)     Time: 1610-9604 PT Time Calculation (min) (ACUTE ONLY): 23 min  Charges:  $Therapeutic Activity: 23-37 mins                     Zenaida Niece, PT, DPT Acute Rehabilitation Pager: [phone] Office [place] La[place]ia Cihlar 11/01/2021, 10:54 AM

## 2021-11-01 NOTE — TOC Progression Note (Addendum)
Transition of Care [facility]) - Progression Note    Patient Details  Name: Kristin Gilmore MRN: [id number] Date of Birth: 19-Mar-1951  [facility] of [facility] [place] CM/SW [place], RN Phone Number:[phone]  11/01/2021, 10:07 AM  Clinical Narrative:    CM at bedside to offer patient choice. CM provided patient with [email or web address] list. Patient states that she has had home health but is unable to recall the name of the agency. Per patient ping the patient was last had services with [facility]. CM has called referral to [facility] with [place]. Acceptance pending. MD will need to enter [facility] PT/OT orders per therapy recommendations. CM has attempted to call referral for DME to [facility]. Message left, will await return call.   1345 DME rolling walker and 3in1 ordered called to [facility] with [place]. To be delivered to the bedside         Expected Discharge Plan and Services                                                 Social Determinants of Health (SDOH) Interventions    Readmission Risk Interventions No flowsheet data found.

## 2021-11-02 LAB — CBC WITH DIFFERENTIAL/PLATELET
Abs Immature Granulocytes: 0.01 10*3/uL (ref 0.00–0.07)
Basophils Absolute: 0 10*3/uL (ref 0.0–0.1)
Basophils Relative: 0 %
Eosinophils Absolute: 0.4 10*3/uL (ref 0.0–0.5)
Eosinophils Relative: 6 %
HCT: 24.8 % — ABNORMAL LOW (ref 36.0–46.0)
Hemoglobin: 8.2 g/dL — ABNORMAL LOW (ref 12.0–15.0)
Immature Granulocytes: 0 %
Lymphocytes Relative: 25 %
Lymphs Abs: 1.6 10*3/uL (ref 0.7–4.0)
MCH: 30.8 pg (ref 26.0–34.0)
MCHC: 33.1 g/dL (ref 30.0–36.0)
MCV: 93.2 fL (ref 80.0–100.0)
Monocytes Absolute: 0.7 10*3/uL (ref 0.1–1.0)
Monocytes Relative: 12 %
Neutro Abs: 3.6 10*3/uL (ref 1.7–7.7)
Neutrophils Relative %: 57 %
Platelets: 182 10*3/uL (ref 150–400)
RBC: 2.66 MIL/uL — ABNORMAL LOW (ref 3.87–5.11)
RDW: 14.6 % (ref 11.5–15.5)
WBC: 6.3 10*3/uL (ref 4.0–10.5)
nRBC: 0 % (ref 0.0–0.2)

## 2021-11-02 LAB — GLUCOSE, CAPILLARY
Glucose-Capillary: 90 mg/dL (ref 70–99)
Glucose-Capillary: 116 mg/dL — ABNORMAL HIGH (ref 70–99)
Glucose-Capillary: 117 mg/dL — ABNORMAL HIGH (ref 70–99)

## 2021-11-02 LAB — COMPREHENSIVE METABOLIC PANEL
ALT: 66 U/L — ABNORMAL HIGH (ref 0–44)
AST: 79 U/L — ABNORMAL HIGH (ref 15–41)
Albumin: 2.7 g/dL — ABNORMAL LOW (ref 3.5–5.0)
Alkaline Phosphatase: 78 U/L (ref 38–126)
Anion gap: 6 (ref 5–15)
BUN: 15 mg/dL (ref 8–23)
CO2: 26 mmol/L (ref 22–32)
Calcium: 8.4 mg/dL — ABNORMAL LOW (ref 8.9–10.3)
Chloride: 103 mmol/L (ref 98–111)
Creatinine, Ser: 0.85 mg/dL (ref 0.44–1.00)
GFR, Estimated: >60 mL/min (ref >60)
Glucose, Bld: 109 mg/dL — ABNORMAL HIGH (ref 70–99)
Potassium: 4.3 mmol/L (ref 3.5–5.1)
Sodium: 135 mmol/L (ref 135–145)
Total Bilirubin: 0.5 mg/dL (ref 0.3–1.2)
Total Protein: 4.8 g/dL — ABNORMAL LOW (ref 6.5–8.1)

## 2021-11-02 LAB — LIPASE, BLOOD: Lipase: 24 U/L (ref 11–51)

## 2021-11-02 LAB — AMYLASE: Amylase: 62 U/L (ref 28–100)

## 2021-11-02 MED ORDER — OXYCODONE HCL 10 MG PO TABS: 10.0000 mg | ORAL_TABLET | ORAL | 0 refills | Status: DC | PRN

## 2021-11-02 MED ORDER — METHOCARBAMOL 500 MG PO TABS: 500.0000 mg | ORAL_TABLET | Freq: Three times a day (TID) | ORAL | 1 refills | Status: DC | PRN

## 2021-11-02 MED ORDER — ALPRAZOLAM 0.25 MG PO TABS: 0.2500 mg | ORAL_TABLET | Freq: Two times a day (BID) | ORAL | 0 refills | Status: DC | PRN

## 2021-11-02 MED ORDER — FERROUS GLUCONATE 324 (38 FE) MG PO TABS
324.0000 mg | ORAL_TABLET | Freq: Two times a day (BID) | ORAL | Status: DC
  Administered 2021-11-02 – 2021-11-04 (×5): 324 mg via ORAL
  Filled 2021-11-02 (×6): qty 1

## 2021-11-02 MED ORDER — OXYCODONE HCL ER 20 MG PO T12A: 20.0000 mg | EXTENDED_RELEASE_TABLET | Freq: Two times a day (BID) | ORAL | 0 refills | Status: DC

## 2021-11-02 NOTE — Care Management Important Message (Signed)
Important Message  Patient Details  Name: Kristin Gilmore MRN: [id number] Date of Birth: 21-Dec-1950   Medicare Important Message Given:  Yes     Hannah Beat 11/02/2021, 1:03 PM

## 2021-11-02 NOTE — Progress Notes (Signed)
Physical Therapy Treatment Patient Details Name: Kristin Gilmore MRN: [id number] DOB: Dec 16, 1950 Today's Date: 11/02/2021   History of Present Illness 71 y.o. female presents to [facility] hospital on 10/30/2021 for elective R L3-5 TLIF. PMH includes OA, depression, HTN, parkinson;s disease, pituitary microadenoma, PNA    PT Comments    Pt tolerates treatment well but appears anxious with mobility. Pt reports dizziness with all mobility at this time. Pt is able to ambulate for increased distances this session however continues to require physical assistance for all mobility to improve safety. PT updates recommendations to SNF as the pt's spouse has mobility deficits at baseline as well and a return home would place each at an increased risk of fall or injury.  Recommendations for follow up therapy are one component of a multi-disciplinary discharge planning process, led by the attending physician.  Recommendations may be updated based on patient status, additional functional criteria and insurance authorization.  Follow Up Recommendations  Skilled nursing-short term rehab (<3 hours/day)     Assistance Recommended at Discharge Intermittent Supervision/Assistance  Patient can return home with the following A little help with walking and/or transfers;A little help with bathing/dressing/bathroom;Assistance with cooking/housework;Assist for transportation;Help with stairs or ramp for entrance   Equipment Recommendations  BSC/3in1    Recommendations for Other Services       Precautions / Restrictions Precautions Precautions: Fall;Back Precaution Booklet Issued: No Required Braces or Orthoses: Spinal Brace Spinal Brace: Lumbar corset;Applied in sitting position Restrictions Weight Bearing Restrictions: No     Mobility  Bed Mobility Overal bed mobility: Needs Assistance Bed Mobility: Sit to Sidelying, Rolling Rolling: Min guard       Sit to sidelying: Min assist       Transfers Overall transfer level: Needs assistance Equipment used: Rolling walker (2 wheels) Transfers: Sit to/from Stand Sit to Stand: Min guard           General transfer comment: verbal cues for technique    Ambulation/Gait Ambulation/Gait assistance: Min assist Gait Distance (Feet): 30 Feet (30' x 3 trials) Assistive device: Rolling walker (2 wheels) Gait Pattern/deviations: Step-through pattern Gait velocity: reduced Gait velocity interpretation: <1.8 ft/sec, indicate of risk for recurrent falls   General Gait Details: pt with slowed step-through gait   Stairs             Wheelchair Mobility    Modified Rankin (Stroke Patients Only)       Balance Overall balance assessment: Needs assistance Sitting-balance support: No upper extremity supported, Feet supported Sitting balance-Leahy Scale: Fair     Standing balance support: Bilateral upper extremity supported, Reliant on assistive device for balance Standing balance-Leahy Scale: Poor                              Cognition Arousal/Alertness: Awake/alert Behavior During Therapy: Anxious Overall Cognitive Status: Impaired/Different from baseline Area of Impairment: Memory, Awareness                     Memory: Decreased short-term memory     Awareness: Emergent            Exercises      General Comments General comments (skin integrity, edema, etc.): VSS on RA, pt reports dizziness when standing or even leaning forward in recliner. BP stable throughout session. Pt seems to develop dizziness even when preparing to mobilize but denies anxiety with mobility      Pertinent Vitals/Pain Pain Assessment Pain  Assessment: Faces Faces Pain Scale: Hurts little more Pain Location: back Pain Descriptors / Indicators: Grimacing Pain Intervention(s): Monitored during session    Home Living                          Prior Function            PT Goals (current  goals can now be found in the care plan section) Acute Rehab PT Goals Patient Stated Goal: to reduce dizziness with mobility Progress towards PT goals: Progressing toward goals    Frequency    Min 5X/week      PT Plan Current plan remains appropriate    Co-evaluation              AM-PAC PT "6 Clicks" Mobility   Outcome Measure  Help needed turning from your back to your side while in a flat bed without using bedrails?: A Little Help needed moving from lying on your back to sitting on the side of a flat bed without using bedrails?: A Little Help needed moving to and from a bed to a chair (including a wheelchair)?: A Little Help needed standing up from a chair using your arms (e.g., wheelchair or bedside chair)?: A Little Help needed to walk in hospital room?: A Little Help needed climbing 3-5 steps with a railing? : Total 6 Click Score: 16    End of Session Equipment Utilized During Treatment: Back brace Activity Tolerance: Patient tolerated treatment well Patient left: in bed;with call bell/phone within reach;with bed alarm set Nurse Communication: Mobility status PT Visit Diagnosis: Other abnormalities of gait and mobility (R26.89);Dizziness and giddiness (R42)     Time: 7159-5396 PT Time Calculation (min) (ACUTE ONLY): 44 min  Charges:  $Gait Training: 23-37 mins $Therapeutic Activity: 8-22 mins                     Zenaida Niece, PT, DPT Acute Rehabilitation Pager: [phone] Office [place] Bhavesh Vazquez 11/02/2021, 11:52 AM

## 2021-11-02 NOTE — Social Work (Signed)
CSW acknowledges consult for SNF- assessment will be completed by [facility] and  patient will be provided with choice before she is discharged to facility.  TOC will continue to follow and assist with discharge planning.   Kristin Gilmore, MSW, LCSW Clinical Social Worker

## 2021-11-02 NOTE — Progress Notes (Signed)
° ° ° °  Subjective: 3 Days Post-Op Procedure(s) (LRB): RIGHT L3-4 AND L4-5 TRANSFORAMINAL LUMBAR INTERBODY FUSIONS WITH RODS, SCREWS, CAGES, LOCAL BONE GRAFT, ALLOGRAFT BONE GRAFT, VIVIGEN (N/A) Awake, alert and oriented x 4. I have been out of bed one hour, I'm doing the best I can. Foley to SD. Husband was in room yesterday and it is apparent then she is not willing to work with him, threw cell phone to floor and was yelling at him. Foley placed at greater than 8 hours post Discontinuing due to inability to void 300 cc, mild retention. She is somewhat more mobile though she refused PT and says she is dizzy.  Hgb is steady 8.2-8.6.   Patient reports pain as moderate.    Objective:   VITALS:  Temp:  [97.6 F (36.4 C)-98.6 F (37 C)] 98 F (36.7 C) (02/03 0820) Pulse Rate:  [74-85] 83 (02/03 0820) Resp:  [18-20] 19 (02/03 0820) BP: (102-133)/(56-97) 128/83 (02/03 0820) SpO2:  [90 %-93 %] 93 % (02/03 0820)  Neurologically intact ABD soft Neurovascular intact Sensation intact distally Intact pulses distally Dorsiflexion/Plantar flexion intact Incision: dressing C/D/I, no drainage, scant drainage, and dry dressing. No cellulitis present   LABS Recent Labs    10/31/21 0556 11/01/21 1255 11/02/21 0425  HGB 8.6* 8.5* 8.2*  WBC 7.7 8.4 6.3  PLT 187 179 182   Recent Labs    11/01/21 1255 11/02/21 0425  NA 136 135  K 4.3 4.3  CL 103 103  CO2 27 26  BUN 16 15  CREATININE 0.90 0.85  GLUCOSE 94 109*   No results for input(s): LABPT, INR in the last 72 hours.   Assessment/Plan: 3 Days Post-Op Procedure(s) (LRB): RIGHT L3-4 AND L4-5 TRANSFORAMINAL LUMBAR INTERBODY FUSIONS WITH RODS, SCREWS, CAGES, LOCAL BONE GRAFT, ALLOGRAFT BONE GRAFT, VIVIGEN (N/A) Anemia of blood loss due to surgery, check orthostatics due to complains of dizziness. Very slow in rehab, is controlling the therapy. Needs to be OOB and walking.   Advance diet Up with therapy D/C IV fluids If she is  unable to effectively participate with PT then SNF placement is best for Gradual recuperation and rehab post fusion surgery. D/C foley   [name] 11/02/2021, 8:47 AM Patient ID: Kristin Gilmore, female   DOB: 09-11-1951, 70 y.o.   MRN: [id number]

## 2021-11-02 NOTE — Progress Notes (Signed)
Patient ID: Kristin Gilmore, female   DOB: [birth year], 71 y.o.   MRN: [id number] Has intermittant dizziness that is probably both Parkinson's disease and acute anemia related. But got out of bed and walked short distance to hall and back. Husband has Parkinson's disease and is chronically ill and not able to assist with helping to Transition in and out of bed Motor is intact. Foley out and she has not voided as yet will start in and out cath. Start SNF placement she has been in [facility] previously and would prefer this Facility.

## 2021-11-02 NOTE — [place] FL2 (Addendum)
[place] MEDICAID FL2 LEVEL OF CARE SCREENING TOOL     IDENTIFICATION  Patient Name: Kristin Gilmore Birthdate: 1951-08-23 Sex: female Admission Date (Current Location): 10/30/2021  [facility] and [place] Number:  Herbalist and Address:  [facility], [place] [address] [place], [place] [address]      Provider Number: [id number]  Attending Physician Name and Address:  Jessy Oto, MD  Relative Name and Phone Number:  Walaa Carel [phone]    Current Level of Care: Hospital Recommended Level of Care: [place] Prior Approval Number:  [id number]  Date Approved/Denied:   PASRR Number:    Discharge Plan: SNF    Current Diagnoses: Patient Active Problem List   Diagnosis Date Noted   Other spondylosis with radiculopathy, lumbar region 10/30/2021   Other secondary scoliosis, lumbar region 10/30/2021   Spondylolisthesis, lumbar region 10/30/2021   Spondylolisthesis of lumbosacral region 10/30/2021   Status post total right knee replacement 06/26/2021   Primary osteoarthritis of right knee 06/25/2021   Parkinson's disease ([place]) 01/24/2021   Trochanteric bursitis, left hip 01/18/2020   Lumbar spinal stenosis 12/19/2017   Pain in joint, ankle and foot 05/02/2016   Chronic venous insufficiency 05/02/2016   Peripheral edema 11/01/2015   Intractable chronic migraine without aura 11/02/2014   Tremor 04/13/2014   Migraine without aura 04/13/2014   Pituitary microadenoma ([place]) 04/13/2014    Orientation RESPIRATION BLADDER Height & Weight     Self, Time, Situation, Place  Normal Continent Weight: 63.5 kg Height:  5' 7.5" (171.5 cm)  BEHAVIORAL SYMPTOMS/MOOD NEUROLOGICAL BOWEL NUTRITION STATUS     (n/a) Continent Diet (Carb modified diet)  AMBULATORY STATUS COMMUNICATION OF NEEDS Skin   Limited Assist Verbally Other (Comment) (closed incision noted to back abdominal pad in place)                       Personal  Care Assistance Level of Assistance  Bathing, Feeding, Dressing, Total care Bathing Assistance: Limited assistance Feeding assistance: Independent Dressing Assistance: Limited assistance Total Care Assistance: Limited assistance   Functional Limitations Info  Sight, Hearing, Speech Sight Info: Adequate Hearing Info: Adequate Speech Info: Adequate    SPECIAL CARE FACTORS FREQUENCY  PT (By licensed PT), OT (By licensed OT)     PT Frequency: 5X OT Frequency: 5X            Contractures Contractures Info: Not present    Additional Factors Info  Code Status, Allergies, Psychotropic, Insulin Sliding Scale, Isolation Precautions, Suctioning Needs Code Status Info: Full Allergies Info: Levofloxacin, Amoxicillin, Clarithromycin, Atropine Psychotropic Info: see discharge summary Insulin Sliding Scale Info: see discharge summary Isolation Precautions Info: n/a Suctioning Needs: n/a   Current Medications (11/02/2021):  This is the current hospital active medication list Current Facility-Administered Medications  Medication Dose Route Frequency Provider Last Rate Last Admin   0.9 %  sodium chloride infusion  250 mL Intravenous Continuous Jessy Oto, MD       acetaminophen (TYLENOL) tablet 650 mg  650 mg Oral Q4H PRN Jessy Oto, MD   650 mg at 11/01/21 1510   Or   acetaminophen (TYLENOL) suppository 650 mg  650 mg Rectal Q4H PRN Jessy Oto, MD       acidophilus (RISAQUAD) capsule 1 capsule  1 capsule Oral Daily Donnamae Jude, RPH   1 capsule at 11/02/21 0949   ALPRAZolam Duanne Moron) tablet 0.25 mg  0.25 mg Oral BID PRN  Jessy Oto, MD   0.25 mg at 11/02/21 0949   alum & mag hydroxide-simeth (MAALOX/MYLANTA) 200-200-20 MG/5ML suspension 30 mL  30 mL Oral Q6H PRN Jessy Oto, MD       amitriptyline (ELAVIL) tablet 30 mg  30 mg Oral QHS Jessy Oto, MD   30 mg at 11/01/21 2121   amLODipine (NORVASC) tablet 5 mg  5 mg Oral BID Jessy Oto, MD   5 mg at 11/02/21 4081    aspirin EC tablet 81 mg  81 mg Oral Q breakfast Jessy Oto, MD   81 mg at 11/02/21 4481   bisacodyl (DULCOLAX) EC tablet 5 mg  5 mg Oral Daily PRN Jessy Oto, MD       calcium citrate (CALCITRATE - dosed in mg elemental calcium) tablet 200 mg of elemental calcium  200 mg of elemental calcium Oral Daily Donnamae Jude, RPH   200 mg of elemental calcium at 11/02/21 0947   calcium citrate (CALCITRATE - dosed in mg elemental calcium) tablet 400 mg of elemental calcium  400 mg of elemental calcium Oral QPM Donnamae Jude, RPH   400 mg of elemental calcium at 11/01/21 1903   carvedilol (COREG) tablet 12.5 mg  12.5 mg Oral BID WC Jessy Oto, MD   12.5 mg at 11/02/21 8563   cholecalciferol (VITAMIN D3) tablet 4,000 Units  4,000 Units Oral Q supper Jessy Oto, MD   4,000 Units at 11/01/21 1903   docusate sodium (COLACE) capsule 100 mg  100 mg Oral BID Jessy Oto, MD   100 mg at 11/02/21 0948   ferrous gluconate (FERGON) tablet 324 mg  324 mg Oral BID WC Jessy Oto, MD   324 mg at 11/02/21 0948   fesoterodine (TOVIAZ) tablet 8 mg  8 mg Oral Daily Jessy Oto, MD   8 mg at 11/02/21 1497   gabapentin (NEURONTIN) capsule 300 mg  300 mg Oral TID Jessy Oto, MD   300 mg at 11/02/21 0948   HYDROmorphone (DILAUDID) injection 0.5 mg  0.5 mg Intravenous Q3H PRN Jessy Oto, MD   0.5 mg at 11/01/21 1542   insulin aspart (novoLOG) injection 0-6 Units  0-6 Units Subcutaneous TID WC Jessy Oto, MD       irbesartan (AVAPRO) tablet 300 mg  300 mg Oral Daily Jessy Oto, MD   300 mg at 11/02/21 0263   loperamide (IMODIUM) capsule 2 mg  2 mg Oral QID PRN Jessy Oto, MD       magnesium oxide (MAG-OX) tablet 400 mg  400 mg Oral QHS Donnamae Jude, RPH   400 mg at 11/01/21 2159   menthol-cetylpyridinium (CEPACOL) lozenge 3 mg  1 lozenge Oral PRN Jessy Oto, MD       Or   phenol (CHLORASEPTIC) mouth spray 1 spray  1 spray Mouth/Throat PRN Jessy Oto, MD       methocarbamol  (ROBAXIN) tablet 500 mg  500 mg Oral Q6H PRN Jessy Oto, MD   500 mg at 11/01/21 1541   Or   methocarbamol (ROBAXIN) 500 mg in dextrose 5 % 50 mL IVPB  500 mg Intravenous Q6H PRN Jessy Oto, MD 100 mL/hr at 10/30/21 1625 500 mg at 10/30/21 1625   methocarbamol (ROBAXIN) 500 mg in dextrose 5 % 50 mL IVPB  500 mg Intravenous Q8H Jessy Oto, MD 100 mL/hr at 11/02/21 0511 500 mg  at 11/02/21 0511   omega-3 acid ethyl esters (LOVAZA) capsule 1 g  1 g Oral Daily Jessy Oto, MD   1 g at 11/02/21 0949   ondansetron (ZOFRAN) tablet 4 mg  4 mg Oral Q6H PRN Jessy Oto, MD       Or   ondansetron [facility]) injection 4 mg  4 mg Intravenous Q6H PRN Jessy Oto, MD       oxyCODONE (Oxy IR/ROXICODONE) immediate release tablet 10 mg  10 mg Oral Q3H PRN Jessy Oto, MD   10 mg at 11/02/21 5009   oxyCODONE (Oxy IR/ROXICODONE) immediate release tablet 5 mg  5 mg Oral Q3H PRN Donnamae Jude, [facility]       oxyCODONE (OXYCONTIN) 12 hr tablet 20 mg  20 mg Oral Q12H Jessy Oto, MD   20 mg at 11/01/21 2121   pantoprazole (PROTONIX) EC tablet 40 mg  40 mg Oral BID Jessy Oto, MD   40 mg at 11/02/21 0949   polyethylene glycol (MIRALAX / GLYCOLAX) packet 17 g  17 g Oral Daily Jessy Oto, MD   17 g at 11/02/21 0954   QUEtiapine (SEROQUEL) tablet 150 mg  150 mg Oral QHS Jessy Oto, MD   150 mg at 11/01/21 2120   simethicone (MYLICON) chewable tablet 160 mg  160 mg Oral QID Jessy Oto, MD   160 mg at 11/02/21 0949   sodium chloride (OCEAN) 0.65 % nasal spray 1 spray  1 spray Each Nare TID Jessy Oto, MD   1 spray at 11/02/21 1002   sodium chloride flush (NS) 0.9 % injection 3 mL  3 mL Intravenous Q12H Jessy Oto, MD   3 mL at 11/02/21 1002   sodium chloride flush (NS) 0.9 % injection 3 mL  3 mL Intravenous PRN Jessy Oto, MD       sodium phosphate (FLEET) 7-19 GM/118ML enema 1 enema  1 enema Rectal Once PRN Jessy Oto, MD       trihexyphenidyl (ARTANE) tablet 2 mg  2 mg Oral  TID WC Jessy Oto, MD   2 mg at 11/02/21 0948   venlafaxine XR (EFFEXOR-XR) 24 hr capsule 75 mg  75 mg Oral Q breakfast Jessy Oto, MD   75 mg at 11/02/21 3818     Discharge Medications: Please see discharge summary for a list of discharge medications.  Relevant Imaging Results:  Relevant Lab Results:   Additional Information SS# [id number]  Angelita Ingles, RN

## 2021-11-02 NOTE — TOC Progression Note (Signed)
Transition of Care [facility]) - Progression Note    Patient Details  Name: Kristin Gilmore MRN: [id number] Date of Birth: 1951-06-07  Transition of [facility] CM/SW Gilman, RN Phone [phone]  11/02/2021, 9:46 AM  Clinical Narrative:    New TOC consult acknowledged. Patient now has referral for SNF. CM will update SW for SNF workup.        Expected Discharge Plan and Services                                                 Social Determinants of Health (SDOH) Interventions    Readmission Risk Interventions No flowsheet data found.

## 2021-11-02 NOTE — Progress Notes (Addendum)
Occupational Therapy Treatment Patient Details Name: Kristin Gilmore MRN: [id number] DOB: 12-12-1950 Today's Date: 11/02/2021   History of present illness 71 y.o. female presents to [facility] hospital on 10/30/2021 for elective R L3-5 TLIF. PMH includes OA, depression, HTN, parkinson;s disease, pituitary microadenoma, PNA   OT comments  Patient continues to complain of dizziness and back pain.  The patient is needing up to [place] for short distance mobility, and up to [place] A for lower body ADL from a sit/stand level.  Per the patient, her spouse cannot provide reliable assistance given his Parkinson's.  She believes her mobility needs to improve, in order to safely return home.  Dizziness and back pain are the primary deficits impacting mobility.  Given her current needs for 24 hour assist, and the spouse inability to provide, the patient would benefit from a short rehab stay at a local SNF.     Recommendations for follow up therapy are one component of a multi-disciplinary discharge planning process, led by the attending physician.  Recommendations may be updated based on patient status, additional functional criteria and insurance authorization.    Follow Up Recommendations  SNF   Assistance Recommended at Discharge Intermittent Supervision/Assistance  Patient can return home with the following      Equipment Recommendations  BSC/3in1    Recommendations for Other Services      Precautions / Restrictions Precautions Precautions: Fall;Back Precaution Booklet Issued: No Required Braces or Orthoses: Spinal Brace Spinal Brace: Lumbar corset;Applied in sitting position Restrictions Weight Bearing Restrictions: No       Mobility Bed Mobility Overal bed mobility: Needs Assistance Bed Mobility: Sidelying to Sit   Sidelying to sit: Supervision            Transfers Overall transfer level: Needs assistance Equipment used: Rolling walker (2 wheels) Transfers: Sit to/from Stand, Bed to  chair/wheelchair/BSC Sit to Stand: Min guard     Step pivot transfers: Min guard     General transfer comment: cues to push from bed/chair.  Patient with one LOB turning     Balance Overall balance assessment: Needs assistance Sitting-balance support: Feet supported Sitting balance-Leahy Scale: Good Sitting balance - Comments: no posterior lean noted   Standing balance support: Bilateral upper extremity supported, During functional activity, Reliant on assistive device for balance Standing balance-Leahy Scale: Poor                             ADL either performed or assessed with clinical judgement   ADL       Grooming: Wash/dry hands;Wash/dry face;Sitting;Oral care;Standing;Set up           Upper Body Dressing : Set up;Sitting   Lower Body Dressing: Sit to/from stand;Moderate assistance   Toilet Transfer: Minimal assistance;Ambulation;Rolling walker (2 wheels)   Toileting- Clothing Manipulation and Hygiene: Supervision/safety;Sitting/lateral lean              Extremity/Trunk Assessment Upper Extremity Assessment Upper Extremity Assessment: Overall WFL for tasks assessed   Lower Extremity Assessment Lower Extremity Assessment: Defer to PT evaluation   Cervical / Trunk Assessment Cervical / Trunk Assessment: Back Surgery                      Cognition Arousal/Alertness: Awake/alert Behavior During Therapy: Anxious   Area of Impairment: Memory                     Memory: Decreased recall  of precautions, Decreased short-term memory                               General Comments  VSS on RA    Pertinent Vitals/ Pain       Pain Assessment Faces Pain Scale: Hurts whole lot Pain Location: back Pain Descriptors / Indicators: Sore, Aching, Grimacing, Guarding Pain Intervention(s): Monitored during session, Patient requesting pain meds-RN notified                                                           Frequency  Min 2X/week        Progress Toward Goals  OT Goals(current goals can now be found in the care plan section)  Progress towards OT goals: Progressing toward goals  Acute Rehab OT Goals OT Goal Formulation: With patient Time For Goal Achievement: 11/14/21 Potential to Achieve Goals: Good  Plan      Co-evaluation                 AM-PAC OT "6 Clicks" Daily Activity     Outcome Measure   Help from another person eating meals?: A Little Help from another person taking care of personal grooming?: A Little Help from another person toileting, which includes using toliet, bedpan, or urinal?: A Little Help from another person bathing (including washing, rinsing, drying)?: A Lot Help from another person to put on and taking off regular upper body clothing?: A Little Help from another person to put on and taking off regular lower body clothing?: A Lot 6 Click Score: 16    End of Session Equipment Utilized During Treatment: Rolling walker (2 wheels)  OT Visit Diagnosis: Unsteadiness on feet (R26.81)   Activity Tolerance Patient tolerated treatment well   Patient Left in chair;with call bell/phone within reach   Nurse Communication Mobility status        Time: 2725-3664 OT Time Calculation (min): 27 min  Charges: OT General Charges $OT Visit: 1 Visit OT Treatments $Self Care/Home Management : 23-37 mins  11/02/2021  RP, OTR/L  Acute Rehabilitation Services  Office:  [phone]   Metta Clines 11/02/2021, 9:44 AM

## 2021-11-02 NOTE — Discharge Summary (Addendum)
Physician Discharge Summary      Patient ID: Kristin Gilmore MRN: [id number] DOB/AGE: [birth year] 72 y.o.  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 female who was admitted 10/30/2021 with a chief complaint of spondylolisthesis with lumbar radiculopathy. , and found to have a diagnosis of Spondylolisthesis, lumbar region.  They were brought to the operating room on 10/30/2021 and underwent the above named procedures.    They were given perioperative antibiotics:  Anti-infectives (From admission, onward)    Start     Dose/Rate Route Frequency Ordered Stop   10/30/21 0615  vancomycin (VANCOCIN) IVPB 1000 mg/200 mL premix        1,000 mg 200 mL/hr over 60 Minutes Intravenous On call to O.R. 10/30/21 0601 10/30/21 0806     In PACU she had complaints of pain and discomfort, took maximal amounts of narcotics and still was concerned. She was stable and able to be transferred to [facility] for post operative care and rehabilitation. Overnight  she had continued discomfort, narcotics and muscle relaxers were effective in in relieving pain. POD#1 Hgb 8.6, she had an episode of  Hypoglycemia that responded to fruit juice and apple sauce, was on a sliding scale of insulin But had not received any. SSI was discontinued. Started on some xanax for anxiety but she  Remained somewhat hyperactive throughout the entire hospitalization. PT and OT initiated. She was unable to void after 8 hours, straight catheterization with 300 cc a foley catheter was replaced. She had some abdomenal bloating post op and was uncomfortable. This responded to mylicon and simethacone. VSS. POD#2 she complained of sharp pain in the abdomen and the results of lab showed Hgb 8.4 stabel and normal eletrolytes, KUB and chest xray showed  Gastric dialation with air. The sharp pain resolved spontaneously and likely was gastric distension pain. She was out of bed to bedside chair but requested to not have therapy due to  Feeling dizzy. POD#3. Hgb 8.2 abdomenal distension was resolved and able to start ferrous Gluconate. Told that she needed to get moving walking and to be OOB to mobilize prevent  Pulmonary complication or DVT. She was seen by  PT/OT and did increase her activities walkiing to the hallway and back, stood at sink. Indicated that husband is probably not in a Condition where he could assist with ADLs if she were to go home, he has parkinson's disease and is not able to perform lifting. SNF placement was undertaken. Orthostatic BP did  Not suggest hypotension due to anemia or hypovolemia. She is to be placed  for short term Rehabilitation. Foley was discontinued.   They were given sequential compression devices, early ambulation, and chemoprophylaxis baby aspirin for DVT prophylaxis.  They benefited maximally from their hospital stay and there were no complications.    Recent vital signs:  Vitals:   11/02/21 0355 11/02/21 0820  BP: (!) 133/56 128/83  Pulse: 74 83   Resp: 18 19  Temp: 97.6 F (36.4 C) 98 F (36.7 C)  SpO2: 90% 93%    Recent laboratory studies:  Results for orders placed or performed during the hospital encounter of 10/30/21  Glucose, capillary  Result Value Ref Range   Glucose-Capillary 103 (H) 70 - 99 mg/dL  Basic Metabolic Panel  Result Value Ref Range   Sodium 135 135 - 145 mmol/L   Potassium 3.8 3.5 - 5.1 mmol/L   Chloride 102 98 - 111 mmol/L   CO2 28 22 - 32 mmol/L   Glucose, Bld 100 (H) 70 - 99 mg/dL   BUN 21 8 - 23 mg/dL   Creatinine, Ser 0.93 0.44 - 1.00 mg/dL   Calcium 8.2 (L) 8.9 - 10.3 mg/dL   GFR, Estimated >60 >60 mL/min   Anion gap 5 5 - 15  CBC  Result Value Ref Range   WBC 7.7 4.0 - 10.5 K/uL   RBC 2.83 (L) 3.87 - 5.11 MIL/uL   Hemoglobin 8.6 (L) 12.0 - 15.0 g/dL   HCT 25.9 (L) 36.0 - 46.0 %   MCV 91.5 80.0 - 100.0 fL   MCH 30.4 26.0 - 34.0 pg   MCHC 33.2 30.0 - 36.0 g/dL   RDW 14.5 11.5 - 15.5 %   Platelets 187 150 - 400 K/uL   nRBC 0.0 0.0 - 0.2 %  Glucose, capillary  Result Value Ref Range   Glucose-Capillary 121 (H) 70 - 99 mg/dL  Glucose, capillary  Result Value Ref Range   Glucose-Capillary 97 70 - 99 mg/dL   Comment 1 Notify RN    Comment 2 Document in Chart   Glucose, capillary  Result Value Ref Range   Glucose-Capillary 44 (LL) 70 - 99 mg/dL   Comment 1 Notify RN    Comment 2 Document in Chart   Glucose, capillary  Result Value Ref Range   Glucose-Capillary 100 (H) 70 - 99 mg/dL   Comment 1 Notify RN    Comment 2 Document in Chart   Glucose, capillary  Result Value Ref Range   Glucose-Capillary 92 70 - 99 mg/dL   Comment 1 Notify RN    Comment 2 Document in Chart   Glucose, capillary  Result Value Ref Range   Glucose-Capillary 108 (H) 70 - 99 mg/dL  CBC with Differential/Platelet  Result Value Ref Range   WBC 8.4 4.0 - 10.5 K/uL   RBC 2.74 (L) 3.87 - 5.11 MIL/uL   Hemoglobin 8.5 (L) 12.0 - 15.0 g/dL   HCT 25.5 (L) 36.0 - 46.0 %   MCV 93.1 80.0 - 100.0 fL   MCH 31.0  26.0 - 34.0 pg   MCHC 33.3 30.0 - 36.0 g/dL   RDW 14.4 11.5 - 15.5 %   Platelets 179 150 - 400 K/uL   nRBC 0.0 0.0 - 0.2 %   Neutrophils Relative % 61 %   Neutro Abs 5.1 1.7 - 7.7 K/uL   Lymphocytes Relative 23 %   Lymphs Abs 1.9 0.7 - 4.0 K/uL   Monocytes Relative 12 %   Monocytes Absolute 1.0 0.1 - 1.0 K/uL   Eosinophils Relative  4 %   Eosinophils Absolute 0.4 0.0 - 0.5 K/uL   Basophils Relative 0 %   Basophils Absolute 0.0 0.0 - 0.1 K/uL   Immature Granulocytes 0 %   Abs Immature Granulocytes 0.03 0.00 - 0.07 K/uL  Basic metabolic panel  Result Value Ref Range   Sodium 136 135 - 145 mmol/L   Potassium 4.3 3.5 - 5.1 mmol/L   Chloride 103 98 - 111 mmol/L   CO2 27 22 - 32 mmol/L   Glucose, Bld 94 70 - 99 mg/dL   BUN 16 8 - 23 mg/dL   Creatinine, Ser 0.90 0.44 - 1.00 mg/dL   Calcium 8.3 (L) 8.9 - 10.3 mg/dL   GFR, Estimated >60 >60 mL/min   Anion gap 6 5 - 15  Glucose, capillary  Result Value Ref Range   Glucose-Capillary 82 70 - 99 mg/dL  CBC with Differential/Platelet  Result Value Ref Range   WBC 6.3 4.0 - 10.5 K/uL   RBC 2.66 (L) 3.87 - 5.11 MIL/uL   Hemoglobin 8.2 (L) 12.0 - 15.0 g/dL   HCT 24.8 (L) 36.0 - 46.0 %   MCV 93.2 80.0 - 100.0 fL   MCH 30.8 26.0 - 34.0 pg   MCHC 33.1 30.0 - 36.0 g/dL   RDW 14.6 11.5 - 15.5 %   Platelets 182 150 - 400 K/uL   nRBC 0.0 0.0 - 0.2 %   Neutrophils Relative % 57 %   Neutro Abs 3.6 1.7 - 7.7 K/uL   Lymphocytes Relative 25 %   Lymphs Abs 1.6 0.7 - 4.0 K/uL   Monocytes Relative 12 %   Monocytes Absolute 0.7 0.1 - 1.0 K/uL   Eosinophils Relative 6 %   Eosinophils Absolute 0.4 0.0 - 0.5 K/uL   Basophils Relative 0 %   Basophils Absolute 0.0 0.0 - 0.1 K/uL   Immature Granulocytes 0 %   Abs Immature Granulocytes 0.01 0.00 - 0.07 K/uL  Glucose, capillary  Result Value Ref Range   Glucose-Capillary 111 (H) 70 - 99 mg/dL  Comprehensive metabolic panel  Result Value Ref Range   Sodium 135 135 - 145 mmol/L   Potassium 4.3 3.5  - 5.1 mmol/L   Chloride 103 98 - 111 mmol/L   CO2 26 22 - 32 mmol/L   Glucose, Bld 109 (H) 70 - 99 mg/dL   BUN 15 8 - 23 mg/dL   Creatinine, Ser 0.85 0.44 - 1.00 mg/dL   Calcium 8.4 (L) 8.9 - 10.3 mg/dL   Total Protein 4.8 (L) 6.5 - 8.1 g/dL   Albumin 2.7 (L) 3.5 - 5.0 g/dL   AST 79 (H) 15 - 41 U/L   ALT 66 (H) 0 - 44 U/L   Alkaline Phosphatase 78 38 - 126 U/L   Total Bilirubin 0.5 0.3 - 1.2 mg/dL   GFR, Estimated >60 >60 mL/min   Anion gap 6 5 - 15  Lipase, blood  Result Value Ref Range   Lipase 24 11 - 51 U/L  Amylase  Result Value Ref Range   Amylase 62 28 - 100 U/L  Glucose, capillary  Result Value Ref Range   Glucose-Capillary 90 70 - 99 mg/dL  ABO/Rh  Result Value Ref Range   ABO/RH(D)      O POS Performed at [facility], [address], [place], [place] [address]     Discharge Medications:   Allergies as of 11/02/2021       Reactions   Levofloxacin Other (See Comments)  Other reaction(s): Dizziness (intolerance)   Amoxicillin Hives   UNSPECIFIED REACTION  Has patient had a PCN reaction causing immediate rash, facial/tongue/throat swelling, SOB or lightheadedness with hypotension: Unknown Has patient had a PCN reaction causing severe rash involving mucus membranes or skin necrosis: Unknown Has patient had a PCN reaction that required hospitalization: Unknown Has patient had a PCN reaction occurring within the last 10 years: No If all of the above answers are "NO", then may proceed with Cephalosporin use.   Clarithromycin Hives, Other (See Comments), Rash   Atropine Hives        Medication List     TAKE these medications    Allantoin 0.5 % Gel Apply 1 application topically 2 (two) times daily.   ALPRAZolam 0.25 MG tablet Commonly known as: XANAX Take 1 tablet (0.25 mg total) by mouth 2 (two) times daily as needed for anxiety.   amitriptyline 10 MG tablet Commonly known as: ELAVIL Take 3 tablets (30 mg total) by mouth at bedtime.    amLODipine 5 MG tablet Commonly known as: NORVASC Take 5 mg by mouth 2 (two) times daily.   aspirin EC 81 MG tablet Take 1 tablet (81 mg total) by mouth 2 (two) times daily. What changed: when to take this   Biofreeze 4 % Gel Generic drug: Menthol (Topical Analgesic) Apply 1 application topically 4 (four) times daily as needed (pain).   Biotin 5000 MCG Caps Take 5,000 mcg by mouth every morning.   bisacodyl 5 MG EC tablet Commonly known as: DULCOLAX Take 5-15 mg by mouth daily as needed (constipation).   CALCIUM CITRATE + D PO Take 1-2 capsules by mouth See admin instructions. Calcium 200 mg, vitamin D3 2.5 mcg - 100 units - take 1 tablet by mouth with breakfast and 2 tablets with supper   carvedilol 25 MG tablet Commonly known as: COREG Take 12.5 mg by mouth 2 (two) times daily with a meal.   CoQ10 100 MG Caps Take 100 mg by mouth daily with supper.   docusate sodium 100 MG capsule Commonly known as: COLACE Take 300 mg by mouth at bedtime as needed (constipation).   ferrous sulfate 325 (65 FE) MG EC tablet Take 325 mg by mouth See admin instructions. Take one tablet (325) mg) by mouth every afternoon on an empty stomach   Fish Oil 1000 MG Caps Take 1,000 mg by mouth 2 (two) times daily.   glimepiride 2 MG tablet Commonly known as: AMARYL Take 1 mg by mouth daily before breakfast.   Glucosamine-Chondroitin 500-400 MG Caps Take 1-2 capsules by mouth See admin instructions. Take one capsule by mouth every morning, and two capsules at night   ibuprofen 200 MG tablet Commonly known as: ADVIL Take 400 mg by mouth daily as needed for headache.   LACTINEX PO Take 1 tablet by mouth daily.   loperamide 2 MG capsule Commonly known as: IMODIUM Take 2 mg by mouth 4 (four) times daily as needed for diarrhea or loose stools.   MAGNESIUM CITRATE PO Take 150 mg by mouth at bedtime.   Magnesium Oxide 500 MG Tabs Take 500 mg by mouth at bedtime.   methocarbamol 500 MG  tablet Commonly known as: ROBAXIN Take 1 tablet (500 mg total) by mouth every 8 (eight) hours as needed for muscle spasms.   morphine 15 MG 12 hr tablet Commonly known as: MS Contin Take 1 tablet (15 mg total) by mouth every 12 (twelve) hours. What changed:  when to take this  reasons to take this   mupirocin ointment 2 % Commonly known as: BACTROBAN Place 1 application into the nose 3 (three) times daily.   OVER THE COUNTER MEDICATION Take 1 capsule by mouth 2 (two) times daily. [place]coe brain blood vessel relaxation (butterburr root extract)   OVER THE COUNTER MEDICATION Apply 1 application topically daily. Leg & back pain relief cream   oxyCODONE 20 mg 12 hr tablet Commonly known as: OXYCONTIN Take 1 tablet (20 mg total) by mouth every 12 (twelve) hours.   Oxycodone HCl 10 MG Tabs Take 1 tablet (10 mg total) by mouth every 3 (three) hours as needed for severe pain ((score 7 to 10)).   oxyCODONE-acetaminophen 5-325 MG tablet Commonly known as: Percocet Take 1 tablet by mouth every 8 (eight) hours as needed.   pantoprazole 40 MG tablet Commonly known as: PROTONIX Take 40 mg by mouth 2 (two) times daily.   PROBIOTIC PO Take 1 capsule by mouth daily with breakfast. SUPREMA DOPHILUS 5 BILLION CFU   QUEtiapine 300 MG tablet Commonly known as: SEROQUEL TAKE 0.5 TABLETS (150 MG TOTAL) BY MOUTH AT BEDTIME. What changed: See the new instructions.   Red Yeast Rice 600 MG Caps Take 1,200 mg by mouth 2 (two) times daily.   Simethicone 180 MG Caps Take 180 mg by mouth daily as needed (gas/bloating).   sodium chloride 0.65 % Soln nasal spray Commonly known as: OCEAN Place 1 spray into both nostrils 3 (three) times daily.   telmisartan 80 MG tablet Commonly known as: MICARDIS Take 80 mg by mouth at bedtime.   tolterodine 4 MG 24 hr capsule Commonly known as: DETROL LA Take 4 mg by mouth daily with breakfast.   trihexyphenidyl 2 MG tablet Commonly known as:  ARTANE TAKE 1 TABLET (2 MG TOTAL) BY MOUTH 3 (THREE) TIMES DAILY WITH MEALS. What changed: See the new instructions.   Turmeric 500 MG Caps Take 500 mg by mouth 2 (two) times daily with a meal.   venlafaxine XR 75 MG 24 hr capsule Commonly known as: EFFEXOR-XR TAKE 1 CAPSULE BY MOUTH DAILY WITH BREAKFAST. What changed: how to take this   Vitamin D3 50 MCG (2000 UT) Tabs Take 4,000 Units by mouth daily with supper.               Durable Medical Equipment  (From admission, onward)           Start     Ordered   10/30/21 1716  DME Walker rolling  Once       Question:  Patient needs a walker to treat with the following condition  Answer:  Fusion of spine of lumbar region   10/30/21 1715   10/30/21 1716  DME 3 n 1  Once        10/30/21 1715            Diagnostic Studies: DG Chest 2 View  Result Date: 10/25/2021 CLINICAL DATA:  Preop spine surgery EXAM: CHEST - 2 VIEW COMPARISON:  Chest x-ray 03/13/2016 FINDINGS: The heart size and mediastinal contours are within normal limits. Both lungs are clear. Degenerative changes of the spine. Calcific tendinopathy at the right shoulder IMPRESSION: No active cardiopulmonary disease. Electronically Signed   By: Donavan Foil M.D.   On: 10/25/2021 23:45   DG Lumbar Spine Complete  Result Date: 10/30/2021 CLINICAL DATA:  Lumbar fusion. Fluoroscopy time was 3 minutes and 13 seconds, 114.59 mGy EXAM: LUMBAR SPINE - COMPLETE 4+ VIEW COMPARISON:  04/09/2021 FINDINGS: Fluoroscopic images demonstrate bilateral pedicle screw and rod fixation at L3, L4 and L5 with interbody devices at L3-L4 and L4-L5. Disc space narrowing at L5-S1. IMPRESSION: Interbody fusion at L3 through L5. Electronically Signed   By: Markus Daft M.D.   On: 10/30/2021 14:01   DG Abd 1 View  Result Date: 11/01/2021 CLINICAL DATA:  Abdominal pain, recent lumbar surgery EXAM: ABDOMEN - 1 VIEW COMPARISON:  07/16/2019 FINDINGS: 2 supine frontal views of the abdomen and pelvis  are obtained. There is prominent gaseous distention of the stomach. No bowel obstruction or ileus. No masses or abnormal calcifications. Postsurgical changes are seen from lower lumbar discectomy and posterior fusion. IMPRESSION: 1. Prominent gaseous distention of the stomach. 2. No evidence of bowel obstruction or ileus. These results were discussed by telephone at the time of interpretation on 11/01/2021 at 6:43 pm to provider [name] , who verbally acknowledged these results. Electronically Signed   By: Randa Ngo M.D.   On: 11/01/2021 18:47   DG CHEST PORT 1 VIEW  Result Date: 11/01/2021 CLINICAL DATA:  Abdominal pain EXAM: PORTABLE CHEST 1 VIEW COMPARISON:  10/24/2021 FINDINGS: Single frontal view of the chest demonstrates an unremarkable cardiac silhouette. Linear opacity in the retrocardiac region consistent with hypoventilatory change and subsegmental atelectasis. No airspace disease, effusion, or pneumothorax. No acute bony abnormalities. IMPRESSION: 1. Subsegmental atelectasis at the left lung base. No acute airspace disease. Electronically Signed   By: Randa Ngo M.D.   On: 11/01/2021 18:49   DG Epidural/Nerve Root  Result Date: 10/11/2021 CLINICAL DATA:  Lumbosacral spondylosis without myelopathy. Right leg and groin pain. Advanced multilevel neural foraminal stenosis. Right-sided epidural injections requested at L3 and L4. EXAM: EPIDURAL/NERVE ROOT FLUOROSCOPY TIME:  Fluoroscopy Time: 38 seconds Radiation Exposure Index: 53.87 microGray*m^2 PROCEDURE: The procedure, risks, benefits, and alternatives were explained to the patient. Questions regarding the procedure were encouraged and answered. The patient understands and consents to the procedure. RIGHT L3 NERVE ROOT BLOCK AND TRANSFORAMINAL EPIDURAL: A posterior oblique approach was taken to the intervertebral foramen on the right at L3-4 using a curved 3.5 inch 22 gauge spinal needle. Injection of Isovue-M 200 outlined the right L3  nerve root and showed good epidural spread. No vascular opacification is seen. 40 mg of Depo-Medrol mixed with 1.5 mL of 1% lidocaine were instilled. RIGHT L4 NERVE ROOT BLOCK AND TRANSFORAMINAL EPIDURAL: A posterior oblique approach was taken to the intervertebral foramen on the right at L4-5 using a curved 3.5 inch 22 gauge spinal needle. Injection of Isovue-M 200 outlined the right L4 nerve root and showed good epidural spread. No vascular opacification is seen. 40 mg of Depo-Medrol mixed with 1.5 mL of 1% lidocaine were instilled. The procedure was well-tolerated, and the patient was discharged thirty minutes following the injection in good condition. She reported relief of her leg pain at the time of discharge. COMPLICATIONS: None IMPRESSION: Technically successful injections consisting of right L3 and right L4 nerve root blocks and transforaminal epidurals. Electronically Signed   By: Logan Bores M.D.   On: 10/11/2021 16:06   DG Epidural/Nerve Root  Result Date: 10/11/2021 CLINICAL DATA:  Lumbosacral spondylosis without myelopathy. Right leg and groin pain. Advanced multilevel neural foraminal stenosis. Right-sided epidural injections requested at L3 and L4. EXAM: EPIDURAL/NERVE ROOT FLUOROSCOPY TIME:  Fluoroscopy Time: 38 seconds Radiation Exposure Index: 53.87 microGray*m^2 PROCEDURE: The procedure, risks, benefits, and alternatives were explained to the patient. Questions regarding the procedure were encouraged and answered. The patient  understands and consents to the procedure. RIGHT L3 NERVE ROOT BLOCK AND TRANSFORAMINAL EPIDURAL: A posterior oblique approach was taken to the intervertebral foramen on the right at L3-4 using a curved 3.5 inch 22 gauge spinal needle. Injection of Isovue-M 200 outlined the right L3 nerve root and showed good epidural spread. No vascular opacification is seen. 40 mg of Depo-Medrol mixed with 1.5 mL of 1% lidocaine were instilled. RIGHT L4 NERVE ROOT BLOCK AND  TRANSFORAMINAL EPIDURAL: A posterior oblique approach was taken to the intervertebral foramen on the right at L4-5 using a curved 3.5 inch 22 gauge spinal needle. Injection of Isovue-M 200 outlined the right L4 nerve root and showed good epidural spread. No vascular opacification is seen. 40 mg of Depo-Medrol mixed with 1.5 mL of 1% lidocaine were instilled. The procedure was well-tolerated, and the patient was discharged thirty minutes following the injection in good condition. She reported relief of her leg pain at the time of discharge. COMPLICATIONS: None IMPRESSION: Technically successful injections consisting of right L3 and right L4 nerve root blocks and transforaminal epidurals. Electronically Signed   By: Logan Bores M.D.   On: 10/11/2021 16:06   DG C-Arm 1-60 Min-No Report  Result Date: 10/30/2021 Fluoroscopy was utilized by the requesting physician.  No radiographic interpretation.   DG C-Arm 1-60 Min-No Report  Result Date: 10/30/2021 Fluoroscopy was utilized by the requesting physician.  No radiographic interpretation.   DG C-Arm 1-60 Min-No Report  Result Date: 10/30/2021 Fluoroscopy was utilized by the requesting physician.  No radiographic interpretation.   DG C-Arm 1-60 Min-No Report  Result Date: 10/30/2021 Fluoroscopy was utilized by the requesting physician.  No radiographic interpretation.   DG C-Arm 1-60 Min-No Report  Result Date: 10/30/2021 Fluoroscopy was utilized by the requesting physician.  No radiographic interpretation.    Disposition: Discharge disposition: 03-Skilled Nursing Facility       Discharge Instructions     Call MD / Call 911   Complete by: As directed    If you experience chest pain or shortness of breath, CALL 911 and be transported to the hospital emergency room.  If you develope a fever above 101 F, pus (white drainage) or increased drainage or redness at the wound, or calf pain, call your surgeon's office.   Constipation Prevention    Complete by: As directed    Drink plenty of fluids.  Prune juice may be helpful.  You may use a stool softener, such as Colace (over the counter) 100 mg twice a day.  Use MiraLax (over the counter) for constipation as needed.   Diet - low sodium heart healthy   Complete by: As directed    Discharge instructions   Complete by: As directed    Call if there is increasing drainage, fever greater than 101.5, severe head aches, and worsening nausea or light sensitivity. If shortness of breath, bloody cough or chest tightness or pain go to an emergency room. No lifting greater than 10 lbs. Avoid bending, stooping and twisting. Use brace when sitting and out of bed even to go to bathroom. Walk in house for first 2 weeks then may start to get out slowly increasing distances up to one mile by 4-6 weeks post op. After 5 days may shower and change dressing following bathing with shower.When bathing remove the brace shower and replace brace before getting out of the shower. If drainage, keep dry dressing and do not bathe the incision, use an moisture impervious dressing. Please call and return  for scheduled follow up appointment 2 weeks from the time of surgery.   Driving restrictions   Complete by: As directed    No driving for 8 weeks   Increase activity slowly as tolerated   Complete by: As directed    Lifting restrictions   Complete by: As directed    No lifting for 12 weeks   Post-operative opioid taper instructions:   Complete by: As directed    POST-OPERATIVE OPIOID TAPER INSTRUCTIONS: It is important to wean off of your opioid medication as soon as possible. If you do not need pain medication after your surgery it is ok to stop day one. Opioids include: Codeine, Hydrocodone(Norco, Vicodin), Oxycodone(Percocet, oxycontin) and hydromorphone amongst others.  Long term and even short term use of opiods can cause: Increased pain response Dependence Constipation Depression Respiratory  depression And more.  Withdrawal symptoms can include Flu like symptoms Nausea, vomiting And more Techniques to manage these symptoms Hydrate well Eat regular healthy meals Stay active Use relaxation techniques(deep breathing, meditating, yoga) Do Not substitute Alcohol to help with tapering If you have been on opioids for less than two weeks and do not have pain than it is ok to stop all together.  Plan to wean off of opioids This plan should start within one week post op of your joint replacement. Maintain the same interval or time between taking each dose and first decrease the dose.  Cut the total daily intake of opioids by one tablet each day Next start to increase the time between doses. The last dose that should be eliminated is the evening dose.           Follow-up Information     Jessy Oto, MD Follow up in 2 week(s).   Specialty: Orthopedic Surgery Why: For wound re-check Contact information: [address] [place] [address] [phone]                  Signed: [name] 11/02/2021, 3:08 PM    Patient continued therapy. Bed available and ready for transfer to SNF 11/03/21.  Rx on chart. Had to wait for rapid covid test result and too late for transfer on Saturday so transfer ready for Sunday 11/04/21

## 2021-11-03 LAB — RESP PANEL BY RT-PCR (FLU A&B, COVID) ARPGX2
Influenza A by PCR: NEGATIVE
Influenza B by PCR: NEGATIVE
SARS Coronavirus 2 by RT PCR: NEGATIVE

## 2021-11-03 LAB — GLUCOSE, CAPILLARY
Glucose-Capillary: 112 mg/dL — ABNORMAL HIGH (ref 70–99)
Glucose-Capillary: 91 mg/dL (ref 70–99)
Glucose-Capillary: 130 mg/dL — ABNORMAL HIGH (ref 70–99)
Glucose-Capillary: 130 mg/dL — ABNORMAL HIGH (ref 70–99)

## 2021-11-03 NOTE — Progress Notes (Signed)
Physical Therapy Treatment Patient Details Name: Kristin Gilmore MRN: [id number] DOB: 11-May-1951 Today's Date: 11/03/2021   History of Present Illness 71 y.o. female presents to [facility] on 10/30/2021 for elective R L3-5 TLIF. PMH includes OA, depression, HTN, parkinson;s disease, pituitary microadenoma, PNA    PT Comments    Patient cognitively improved with ability to attend to tasks and able to recall back precautions and postural cues/corrections.  ABle to increase ambulation distance to 150 ft with RW and minguard assist.   Recommendations for follow up therapy are one component of a multi-disciplinary discharge planning process, led by the attending physician.  Recommendations may be updated based on patient status, additional functional criteria and insurance authorization.  Follow Up Recommendations  Skilled nursing-short term rehab (<3 hours/day)     Assistance Recommended at Discharge Intermittent Supervision/Assistance  Patient can return home with the following A little help with walking and/or transfers;A little help with bathing/dressing/bathroom;Assistance with cooking/housework;Assist for transportation;Help with stairs or ramp for entrance   Equipment Recommendations  BSC/3in1    Recommendations for Other Services       Precautions / Restrictions Precautions Precautions: Fall;Back Precaution Booklet Issued: No Precaution Comments: adhering to precautions throughout session, even stated, "I can't turn to look behind me because that would be twisting." Required Braces or Orthoses: Spinal Brace Spinal Brace: Lumbar corset;Applied in sitting position     Mobility  Bed Mobility               General bed mobility comments: pt up in bathroom on arrival; returned to recliner    Transfers Overall transfer level: Needs assistance Equipment used: Rolling walker (2 wheels) Transfers: Sit to/from Stand Sit to Stand: Min guard           General  transfer comment: verbal cues for technique    Ambulation/Gait Ambulation/Gait assistance: Min guard Gait Distance (Feet): 150 Feet Assistive device: Rolling walker (2 wheels) Gait Pattern/deviations: Step-through pattern Gait velocity: reduced     General Gait Details: pt with slowed step-through gait; good recall to stand upright and closer to [employer]    Modified Rankin (Stroke Patients Only)       Balance Overall balance assessment: Needs assistance Sitting-balance support: No upper extremity supported, Feet supported Sitting balance-Leahy Scale: Fair Sitting balance - Comments: no posterior lean noted   Standing balance support: Reliant on assistive device for balance, No upper extremity supported Standing balance-Leahy Scale: Fair                              Cognition Arousal/Alertness: Awake/alert Behavior During Therapy: Anxious Overall Cognitive Status: Within Functional Limits for tasks assessed                                          Exercises      General Comments General comments (skin integrity, edema, etc.): no reports of dizziness throughout session; pleased that she could walk out in hallway      Pertinent Vitals/Pain Pain Assessment Pain Assessment: Faces Faces Pain Scale: Hurts little more Pain Location: back Pain Descriptors / Indicators: Guarding Pain Intervention(s): Limited activity within patient's tolerance, Monitored during session, Premedicated before session, Repositioned    Home Living  Prior Function            PT Goals (current goals can now be found in the care plan section) Acute Rehab PT Goals Patient Stated Goal: to reduce dizziness with mobility Time For Goal Achievement: 11/14/21 Potential to Achieve Goals: Fair Progress towards PT goals: Progressing toward goals    Frequency    Min 5X/week      PT  Plan Current plan remains appropriate    Co-evaluation              AM-PAC PT "6 Clicks" Mobility   Outcome Measure  Help needed turning from your back to your side while in a flat bed without using bedrails?: A Little Help needed moving from lying on your back to sitting on the side of a flat bed without using bedrails?: A Little Help needed moving to and from a bed to a chair (including a wheelchair)?: A Little Help needed standing up from a chair using your arms (e.g., wheelchair or bedside chair)?: A Little Help needed to walk in hospital room?: A Little Help needed climbing 3-5 steps with a railing? : Total 6 Click Score: 16    End of Session Equipment Utilized During Treatment: Back brace Activity Tolerance: Patient tolerated treatment well Patient left: with call bell/phone within reach;in chair Nurse Communication: Mobility status PT Visit Diagnosis: Other abnormalities of gait and mobility (R26.89);Dizziness and giddiness (R42)     Time: 0737-1062 PT Time Calculation (min) (ACUTE ONLY): 8 min  Charges:  $Gait Training: 8-22 mins                      Arby Barrette, PT Acute Rehabilitation Services  Pager [phone] Office [phone]    Rexanne Mano 11/03/2021, 1:45 PM

## 2021-11-03 NOTE — TOC Initial Note (Addendum)
Transition of Care [facility]) - Initial/Assessment Note    Patient Details  Name: Kristin Gilmore MRN: [id number] Date of Birth: 21-Sep-1951  Transition of Care [facility]) CM/SW Contact:    Alfredia Ferguson, LCSW Phone Number: 11/03/2021, 10:49 AM  Clinical Narrative:                 CSW went to see patient to discuss SNF recommendation but noted patient had a difficult morning including screaming from her bathroom and yelling at staff. Per RN station this has unfortunately been a pattern this AM. CSW called spouse and introduced herself and role. Per spouse this is often due to when patient is experiencing a lot of pain and is unable to get his controlled. CSW noted spouse supports SNF and per conversations with her prior she is in support and per MD documentation she confirmed SNF with the provider. CSW will follow and try to meet the patient when she is more appropriate but due to consent provided to MD, spouse confirming it, and patient's current anxiousness/lability will begin the referral process and notes preference provided prior for [facility].   12:41p: CSW spoke with Dr. Lorin Mercy regarding patient being accepted to [facility] available today. CSW informed patient prior and left HIPPA appropriate voicemail with spouse. Patient wanted to speak with MD regarding it prior to making a decision. CSW visited patient and noted Dr. Lorin Mercy was speaking with her on the phone and patient wanted to speak with husband. CSW noted room phone was unable to dial out and patient is currently speaking with spouse on CSW phone.  Expected Discharge Plan: Skilled Nursing Facility Barriers to Discharge: SNF Pending bed offer   Patient Goals and CMS Choice Patient states their goals for this hospitalization and ongoing recovery are:: per spouse patient is struggling with painc ontrol and is open to rehab due to her and her husband's physical needs CMS Medicare.gov Compare Post Acute Care list provided to::  Patient Choice offered to / list presented to : Spouse, Patient  Expected Discharge Plan and Services Expected Discharge Plan: [place] Acute Care Choice: [place]   Expected Discharge Date: 11/02/21                                    Prior Living Arrangements/Services   Lives with:: Spouse Patient language and need for interpreter reviewed:: Yes Do you feel safe going back to the place where you live?: Yes      Need for Family Participation in Patient Care: No (Comment) Care giver support system in place?: No (comment)   Criminal Activity/Legal Involvement Pertinent to Current Situation/Hospitalization: No - Comment as needed  Activities of Daily Living      Permission Sought/Granted Permission sought to share information with : Facility Art therapist granted to share information with : Yes, Verbal Permission Granted  Share Information with NAME: To MD, confirmed/supported by spouse           Emotional Assessment Appearance:: Appears stated age Attitude/Demeanor/Rapport: Reactive, Screaming Affect (typically observed): Anxious Orientation: : Oriented to Self, Oriented to Place, Oriented to  Time, Oriented to Situation Alcohol / Substance Use: Not Applicable Psych Involvement: No (comment)  Admission diagnosis:  Spondylolisthesis of lumbosacral region [M43.17] Patient Active Problem List   Diagnosis Date Noted   Other spondylosis with radiculopathy, lumbar region 10/30/2021   Other secondary scoliosis, lumbar region  10/30/2021   Spondylolisthesis, lumbar region 10/30/2021   Spondylolisthesis of lumbosacral region 10/30/2021   Status post total right knee replacement 06/26/2021   Primary osteoarthritis of right knee 06/25/2021   Parkinson's disease ([place]) 01/24/2021   Trochanteric bursitis, left hip 01/18/2020   Lumbar spinal stenosis 12/19/2017   Pain in joint, ankle and foot 05/02/2016    Chronic venous insufficiency 05/02/2016   Peripheral edema 11/01/2015   Intractable chronic migraine without aura 11/02/2014   Tremor 04/13/2014   Migraine without aura 04/13/2014   Pituitary microadenoma ([place]) 04/13/2014   PCP:  Aletha Halim., PA-C Pharmacy:   [facility], [address] [place] [address] Phone: [phone] Fax: [phone]  [id number] - [place], [place] [address] [place] [address] Phone: [phone] Fax: [place], [address] [place] [address] Phone: [phone] Fax: [phone]     Social Determinants of Health (SDOH) Interventions    Readmission Risk Interventions No flowsheet data found.

## 2021-11-03 NOTE — Progress Notes (Signed)
° °  Subjective: 4 Days Post-Op Procedure(s) (LRB): RIGHT L3-4 AND L4-5 TRANSFORAMINAL LUMBAR INTERBODY FUSIONS WITH RODS, SCREWS, CAGES, LOCAL BONE GRAFT, ALLOGRAFT BONE GRAFT, VIVIGEN (N/A) Patient reports pain as mild.  Pleasant alert . " I'm not hurting right now. " I have walked in the room.   Objective: Vital signs in last 24 hours: Temp:  [97.9 F (36.6 C)-99.2 F (37.3 C)] 98.2 F (36.8 C) (02/04 0757) Pulse Rate:  [77-81] 78 (02/04 0757) Resp:  [16-19] 18 (02/04 0757) BP: (96-129)/(71-92) 106/86 (02/04 0757) SpO2:  [94 %-98 %] 98 % (02/04 0757)  Intake/Output from previous day: 02/03 0701 - 02/04 0700 In: 480 [P.O.:480] Out: -  Intake/Output this shift: No intake/output data recorded.  Recent Labs    11/01/21 1255 11/02/21 0425  HGB 8.5* 8.2*   Recent Labs    11/01/21 1255 11/02/21 0425  WBC 8.4 6.3  RBC 2.74* 2.66*  HCT 25.5* 24.8*  PLT 179 182   Recent Labs    11/01/21 1255 11/02/21 0425  NA 136 135  K 4.3 4.3  CL 103 103  CO2 27 26  BUN 16 15  CREATININE 0.90 0.85  GLUCOSE 94 109*  CALCIUM 8.3* 8.4*   No results for input(s): LABPT, INR in the last 72 hours.  Neurologically intact No results found.  Assessment/Plan: 4 Days Post-Op Procedure(s) (LRB): RIGHT L3-4 AND L4-5 TRANSFORAMINAL LUMBAR INTERBODY FUSIONS WITH RODS, SCREWS, CAGES, LOCAL BONE GRAFT, ALLOGRAFT BONE GRAFT, VIVIGEN (N/A) Up with therapy Discharge to SNF when bed available.   Marybelle Killings 11/03/2021, 11:09 AM

## 2021-11-04 LAB — GLUCOSE, CAPILLARY: Glucose-Capillary: 112 mg/dL — ABNORMAL HIGH (ref 70–99)

## 2021-11-04 NOTE — TOC Transition Note (Signed)
Transition of Care [facility]) - CM/SW Discharge Note   Patient Details  Name: COLANDRA OHANIAN MRN: [id number] Date of Birth: 05/10/1951  Transition of Care [facility]) CM/SW Contact:  Alfredia Ferguson, LCSW Phone Number: 11/04/2021, 8:13 AM   Clinical Narrative:    Patient accepted to [facility] today to room 125. RN number for report is [phone]. CSW prepared PTAR paperwork and when report is called will arrange transport. Spouse notified. Patient's DC will need an addendum to DC date of today. CSW reached out to pass that info along.      Barriers to Discharge: Barriers Resolved   Patient Goals and CMS Choice Patient states their goals for this hospitalization and ongoing recovery are:: per spouse patient is struggling with painc ontrol and is open to rehab due to her and her husband's physical needs CMS Medicare.gov Compare Post Acute Care list provided to:: Patient Choice offered to / list presented to : Spouse, Patient  Discharge Placement                       Discharge Plan and Services     Post Acute Care Choice: [place]                               Social Determinants of Health (SDOH) Interventions     Readmission Risk Interventions No flowsheet data found.

## 2021-11-04 NOTE — Progress Notes (Signed)
Pt with discharge orders, report called to [phone] and given to [place].  IV's removed.  Pt picked up and transported via PTAR. Equipment 3in1 and walker put into side office for [name] the pts husband to come and pick up, this nurse called her husband and ask him to come and get the items.

## 2021-11-05 ENCOUNTER — Telehealth: Payer: Self-pay | Admitting: Specialist

## 2021-11-05 MED FILL — Heparin Sodium (Porcine) Inj 1000 Unit/ML: INTRAMUSCULAR | Qty: 30 | Status: AC

## 2021-11-05 MED FILL — Sodium Chloride IV Soln 0.9%: INTRAVENOUS | Qty: 1000 | Status: AC

## 2021-11-05 NOTE — Telephone Encounter (Signed)
Patient's husband [name] called advised Dr. Louanne Skye was trying to contact him concerning his wife. [name] said Dr Louanne Skye can call him anytime. The number to contact him is [phone]

## 2021-11-08 ENCOUNTER — Ambulatory Visit: Payer: Medicare Other | Admitting: Psychiatry

## 2021-11-15 ENCOUNTER — Ambulatory Visit (INDEPENDENT_AMBULATORY_CARE_PROVIDER_SITE_OTHER): Payer: Medicare Other | Admitting: Specialist

## 2021-11-15 ENCOUNTER — Ambulatory Visit (INDEPENDENT_AMBULATORY_CARE_PROVIDER_SITE_OTHER): Payer: Medicare Other

## 2021-11-15 ENCOUNTER — Other Ambulatory Visit: Payer: Self-pay

## 2021-11-15 ENCOUNTER — Encounter: Payer: Self-pay | Admitting: Specialist

## 2021-11-15 VITALS — BP 134/70 | HR 101 | Ht 67.5 in | Wt 140.0 lb

## 2021-11-15 DIAGNOSIS — M5416 Radiculopathy, lumbar region: Secondary | ICD-10-CM

## 2021-11-15 DIAGNOSIS — M4316 Spondylolisthesis, lumbar region: Secondary | ICD-10-CM | POA: Diagnosis not present

## 2021-11-15 NOTE — Patient Instructions (Addendum)
° ° °  Call if there is increasing drainage, fever greater than 101.5, severe head aches, and worsening nausea or light sensitivity. If shortness of breath, bloody cough or chest tightness or pain go to an emergency room. No lifting greater than 10 lbs. Avoid bending, stooping and twisting. Use brace when sitting and out of bed even to go to bathroom. Walk in house for first 2 weeks then may start to get out slowly increasing distances up to one mile by 4-6 weeks post op. May shower when bathing remove the brace shower and replace brace before getting out of the shower. When the steristrips come off leave off or remove one week from today.

## 2021-11-15 NOTE — Progress Notes (Signed)
Post-Op Visit Note   Patient: Kristin Gilmore           Date of Birth: [birth year]           MRN: [id number] Visit Date: 11/15/2021 PCP: Aletha Halim., PA-C   Assessment & Plan:16 days post op L3-4 and L4-5 TLIFs Right quadriceps weakness residual of previous severe spondylosis and the surgical procedure.  Chief Complaint:  Chief Complaint  Patient presents with   Lower Back - Pain, Routine Post Op  Complaints of right leg feeling weak, she is still at the SNF undergoing therapy and gait training.  Incision is healed and dressing removed and the staples are ready to be removed. Motor shows no focal weakness.  Radiographs without acute change, hardware and cages in good position and alignment.  Visit Diagnoses:  1. Spondylolisthesis, lumbar region   2. Radiculopathy, lumbar region     Plan: Call if there is increasing drainage, fever greater than 101.5, severe head aches, and worsening nausea or light sensitivity. If shortness of breath, bloody cough or chest tightness or pain go to an emergency room. No lifting greater than 10 lbs. Avoid bending, stooping and twisting. Use brace when sitting and out of bed even to go to bathroom. Walk in house for first 2 weeks then may start to get out slowly increasing distances up to one mile by 4-6 weeks post op. May shower when bathing remove the brace shower and replace brace before getting out of the shower. When the steristrips come off leave off or remove one week from today.   Follow-Up Instructions: No follow-ups on file.   Orders:  Orders Placed This Encounter  Procedures   XR Lumbar Spine 2-3 Views   No orders of the defined types were placed in this encounter.   Imaging: No results found.  PMFS History: Patient Active Problem List   Diagnosis Date Noted   Spondylolisthesis, lumbar region 10/30/2021    Priority: High   Other spondylosis with radiculopathy, lumbar region 10/30/2021   Other secondary  scoliosis, lumbar region 10/30/2021   Spondylolisthesis of lumbosacral region 10/30/2021   Status post total right knee replacement 06/26/2021   Primary osteoarthritis of right knee 06/25/2021   Parkinson's disease ([place]) 01/24/2021   Trochanteric bursitis, left hip 01/18/2020   Lumbar spinal stenosis 12/19/2017   Pain in joint, ankle and foot 05/02/2016   Chronic venous insufficiency 05/02/2016   Peripheral edema 11/01/2015   Intractable chronic migraine without aura 11/02/2014   Tremor 04/13/2014   Migraine without aura 04/13/2014   Pituitary microadenoma ([place]) 04/13/2014   Past Medical History:  Diagnosis Date   Arthritis    Depression    Dyslipidemia    Hypertension    Migraine    Migraine without aura, without mention of intractable migraine without mention of status migrainosus 04/13/2014   Parkinson's disease ([place]) 01/24/2021   Pituitary microadenoma ([place]) 04/13/2014   Pneumonia    Pre-diabetes    "i was told i was pre-diabetic a while ago"   Tremor 04/13/2014    Family History  Problem Relation Age of Onset   Cancer Father        stomach cancer   Migraines Mother     Past Surgical History:  Procedure Laterality Date   CHOLECYSTECTOMY     COLONOSCOPY W/ POLYPECTOMY     LUMBAR LAMINECTOMY  07/2020   3 level decompression - [place], [place]   LUMBAR LAMINECTOMY/DECOMPRESSION MICRODISCECTOMY N/A 12/19/2017   Procedure: LAMINECTOMY LUMBAR TWO-  LUMBAR THREE, LUMBAR THREE- LUMBAR FOUR, LUMBAR FOUR- LUMBAR FIVE ;  Surgeon: Consuella Lose, MD;  Location: [facility];  Service: Neurosurgery;  Laterality: N/A;   NASAL SINUS SURGERY     TONSILLECTOMY     TOTAL KNEE ARTHROPLASTY Right 06/26/2021   Procedure: RIGHT TOTAL KNEE ARTHROPLASTY;  Surgeon: Leandrew Koyanagi, MD;  Location: [facility];  Service: Orthopedics;  Laterality: Right;   WISDOM TOOTH EXTRACTION     Social History   Occupational History   Occupation: Retired  Tobacco Use   Smoking status: Never   Smokeless tobacco:  Never  Vaping Use   Vaping Use: Never used  Substance and Sexual Activity   Alcohol use: Yes    Comment: occasional glass of wine   Drug use: No   Sexual activity: Not on file

## 2021-11-27 ENCOUNTER — Telehealth: Payer: Self-pay | Admitting: Specialist

## 2021-11-27 NOTE — Telephone Encounter (Signed)
Patient called asked if (PT) will be coming in her home like last time? Patient said she did not get to sleep until 5:00 pm this morning and when calling back please leave a message. The number to contact patient is [phone]

## 2021-11-27 NOTE — Telephone Encounter (Signed)
Pt called stating both her legs are swollen and she spoke to on call Dr. Lorin Mercy and he advised he to elevates legs over night and to call Dr. Louanne Skye in the morning. Pt complaints are severe leg and swelling. Please call pt as soon as possible about this matter at [phone]

## 2021-11-28 ENCOUNTER — Other Ambulatory Visit: Payer: Self-pay | Admitting: Radiology

## 2021-11-28 DIAGNOSIS — M79661 Pain in right lower leg: Secondary | ICD-10-CM

## 2021-11-28 NOTE — Telephone Encounter (Signed)
Per Dr. Louanne Skye she should contact her PCP, she states that she did and they told her to call here. I advised that we were going to order the dopplers of both legs, and then if they are negative for DVT that she should contact her PCP and talk to them about a lasix to take.She states that she understands ?

## 2021-11-28 NOTE — Telephone Encounter (Signed)
See other message, we need to find out the cause of her swelling ?

## 2021-11-29 ENCOUNTER — Telehealth: Payer: Self-pay | Admitting: *Deleted

## 2021-11-29 ENCOUNTER — Emergency Department (HOSPITAL_BASED_OUTPATIENT_CLINIC_OR_DEPARTMENT_OTHER): Payer: Medicare Other

## 2021-11-29 ENCOUNTER — Encounter (HOSPITAL_COMMUNITY): Payer: Medicare Other

## 2021-11-29 ENCOUNTER — Telehealth: Payer: Self-pay | Admitting: Specialist

## 2021-11-29 ENCOUNTER — Emergency Department (HOSPITAL_COMMUNITY)
Admission: EM | Admit: 2021-11-29 | Discharge: 2021-11-29 | Disposition: A | Discharge status: Home / Self Care | Payer: Medicare Other | Attending: Emergency Medicine | Admitting: Emergency Medicine

## 2021-11-29 ENCOUNTER — Emergency Department (HOSPITAL_COMMUNITY): Payer: Medicare Other

## 2021-11-29 ENCOUNTER — Encounter (HOSPITAL_COMMUNITY): Payer: Self-pay

## 2021-11-29 DIAGNOSIS — Z7982 Long term (current) use of aspirin: Secondary | ICD-10-CM | POA: Diagnosis not present

## 2021-11-29 DIAGNOSIS — R2242 Localized swelling, mass and lump, left lower limb: Secondary | ICD-10-CM | POA: Diagnosis not present

## 2021-11-29 DIAGNOSIS — D649 Anemia, unspecified: Secondary | ICD-10-CM | POA: Diagnosis not present

## 2021-11-29 DIAGNOSIS — M7989 Other specified soft tissue disorders: Secondary | ICD-10-CM | POA: Diagnosis present

## 2021-11-29 DIAGNOSIS — Z79899 Other long term (current) drug therapy: Secondary | ICD-10-CM | POA: Diagnosis not present

## 2021-11-29 DIAGNOSIS — G20 Parkinson's disease: Secondary | ICD-10-CM | POA: Diagnosis not present

## 2021-11-29 DIAGNOSIS — R0602 Shortness of breath: Secondary | ICD-10-CM | POA: Diagnosis not present

## 2021-11-29 LAB — CBC WITH DIFFERENTIAL/PLATELET
Abs Immature Granulocytes: 0.02 10*3/uL (ref 0.00–0.07)
Basophils Absolute: 0.1 10*3/uL (ref 0.0–0.1)
Basophils Relative: 1 %
Eosinophils Absolute: 0.3 10*3/uL (ref 0.0–0.5)
Eosinophils Relative: 4 %
HCT: 29.9 % — ABNORMAL LOW (ref 36.0–46.0)
Hemoglobin: 9.5 g/dL — ABNORMAL LOW (ref 12.0–15.0)
Immature Granulocytes: 0 %
Lymphocytes Relative: 26 %
Lymphs Abs: 1.9 10*3/uL (ref 0.7–4.0)
MCH: 30 pg (ref 26.0–34.0)
MCHC: 31.8 g/dL (ref 30.0–36.0)
MCV: 94.3 fL (ref 80.0–100.0)
Monocytes Absolute: 0.7 10*3/uL (ref 0.1–1.0)
Monocytes Relative: 10 %
Neutro Abs: 4.4 10*3/uL (ref 1.7–7.7)
Neutrophils Relative %: 59 %
Platelets: 364 10*3/uL (ref 150–400)
RBC: 3.17 MIL/uL — ABNORMAL LOW (ref 3.87–5.11)
RDW: 14.6 % (ref 11.5–15.5)
WBC: 7.4 10*3/uL (ref 4.0–10.5)
nRBC: 0 % (ref 0.0–0.2)

## 2021-11-29 LAB — BASIC METABOLIC PANEL
Anion gap: 10 (ref 5–15)
BUN: 25 mg/dL — ABNORMAL HIGH (ref 8–23)
CO2: 27 mmol/L (ref 22–32)
Calcium: 9.5 mg/dL (ref 8.9–10.3)
Chloride: 101 mmol/L (ref 98–111)
Creatinine, Ser: 0.93 mg/dL (ref 0.44–1.00)
GFR, Estimated: >60 mL/min (ref >60)
Glucose, Bld: 86 mg/dL (ref 70–99)
Potassium: 4.1 mmol/L (ref 3.5–5.1)
Sodium: 138 mmol/L (ref 135–145)

## 2021-11-29 LAB — BRAIN NATRIURETIC PEPTIDE: B Natriuretic Peptide: 59.2 pg/mL (ref 0.0–100.0)

## 2021-11-29 MED ORDER — FUROSEMIDE 20 MG PO TABS: 20.0000 mg | ORAL_TABLET | Freq: Every day | ORAL | 0 refills | Status: DC

## 2021-11-29 NOTE — ED Notes (Signed)
This nurse entered the pt's room to finish triaging the pt and to assist in hooking her up to the cardiac monitor. Pt now finished getting into ED gown and insisting her back brace be placed over her gown. This nurse explained to the pt that she would first need to hook her up to cardiac leads which required putting stickers onto her chest and stomach. The pt shouted at this nurse, "get away from me you didn't explain that the first time! I want another nurse you are abrupt and abrasive! Get out of here!" This nurse exited the exam room and reported incident to [place], Agricultural consultant.  ?

## 2021-11-29 NOTE — ED Notes (Signed)
Pt back from imaging at this time.

## 2021-11-29 NOTE — Telephone Encounter (Signed)
[  Yesterday 2:48 PM] Kristin Gilmore ?I tried to tell her why I was calling and she said I know he has ordered in home PT on me, I have heard this four times.  What do you want.  I said no mam I am calling because he would like a vascular study on your legs. She said oh, well you will have to call me back, I am hungry and going to eat and hung up. ? ?I called [name] this morning with [facility] and pt is schedule today 11/29/21 at 11am at [facility] vascular.  ? ?I called pt and left message with appt date and time on answer machine, I did ask her is she would call me directly once she got the message.  ? ? ? ? ?

## 2021-11-29 NOTE — Discharge Instructions (Signed)
As we discussed we did not find an acute cause of your leg swelling today.  Your heart function, kidney function appear normal.  You have no evidence of blood clot in both of your legs.  I recommend that you follow-up with your primary care doctor as discussed first thing in the morning, and begin taking diuretic pill to help with fluid in your legs for the next few days in the meantime.  If you notice worsening pain, swelling, new shortness of breath, chest pain please return to the emergency department for further evaluation. ?

## 2021-11-29 NOTE — Telephone Encounter (Signed)
Patient called asked for a call back concerning what test is Dr. Louanne Skye ordering for her. Patient said she want a clear understanding what the test is for? The number to contact patient is [phone] ?

## 2021-11-29 NOTE — ED Notes (Signed)
Will attempt EKG once vascular leaves, pt was noncompliant earlier when trying to get an EKG.  ?

## 2021-11-29 NOTE — Telephone Encounter (Signed)
Pt called back and I tried to explain to her when her appt was and she stated she could not make the appt for today, so I asked her if she wanted a later appt time for today and she rudely said "NO, I dont need to have this done and I dont know what the test is and Im not going go blindly on something I know nothing about" I explained to her what the [place] vas was for and why he wanted it done. She was rude during the whole converstion after and I told her I wasn't going to have her to talk to me that way and she brings up all her issues and started getting rude with me. So I told her I will be happy to give her my managers number and she can take it up with her, and I hung up the phone.  ?

## 2021-11-29 NOTE — Progress Notes (Signed)
BLE venous duplex has been completed.  Preliminary results given to [facility]. ? ? ?Results can be found under chart review under CV PROC. ?11/29/2021 5:12 PM ?Shana Zavaleta RVT, RDMS ? ?

## 2021-11-29 NOTE — ED Notes (Signed)
Pt does not want to be placed on cardiac monitoring stating she is only a few weeks out of a major back surgery and doesn't want to remove her back brace for any length of time due to pain. This EMT tried to explain why it was necessary in regards to her chief complaint. Pt continued to state she could not take it off. RN made aware ?

## 2021-11-29 NOTE — ED Triage Notes (Signed)
BIB EMS from home, complains of bilateral leg swelling x2 weeks, per PCP recommendation she should come to the ED. Ambulatory w/ assistance, normally ambulates w/ walker.  ?

## 2021-11-29 NOTE — ED Provider Notes (Signed)
[facility] DEPT Provider Note   CSN: [id number] Arrival date & time: 11/29/21  1455     History  No chief complaint on file.   Kristin Gilmore is a 70 y.o. female with past medical history of polio with left leg wasting, as well as recent history of for back surgery for lumbar fusion on 10/30/2021, who was recently been in rehab facility for several weeks who presents now with bilateral leg swelling, left greater than right.  Patient was advised by PCP to come in for evaluation for edema versus blood clot.  She denies any chest pain, shortness of breath.  She denies any history of blood clots.  She was not placed on a prophylactic Lovenox or other treatment while she was in rehab facility.  She denies history of ACS, heart failure.  She has not had similar swelling in the past.  Denies any feeling of fever, chills.  HPI     Home Medications Prior to Admission medications   Medication Sig Start Date End Date Taking? Authorizing Provider  furosemide (LASIX) 20 MG tablet Take 1 tablet (20 mg total) by mouth daily. 11/29/21  Yes Marialuisa Basara H, PA-C  Allantoin 0.5 % GEL Apply 1 application topically 2 (two) times daily.    [provider]  ALPRAZolam Duanne Moron) 0.25 MG tablet Take 1 tablet (0.25 mg total) by mouth 2 (two) times daily as needed for anxiety. 11/02/21   Jessy Oto, MD  amitriptyline (ELAVIL) 10 MG tablet Take 3 tablets (30 mg total) by mouth at bedtime. 09/04/21   Melvenia Beam, MD  amLODipine (NORVASC) 5 MG tablet Take 5 mg by mouth 2 (two) times daily. 07/27/18   [provider]  aspirin EC 81 MG tablet Take 1 tablet (81 mg total) by mouth 2 (two) times daily. Patient taking differently: Take 81 mg by mouth daily with breakfast. 06/26/21   Aundra Dubin, PA-C  Biotin 5000 MCG CAPS Take 5,000 mcg by mouth every morning.    [provider]  bisacodyl (DULCOLAX) 5 MG EC tablet Take 5-15 mg by mouth daily as  needed (constipation).    [provider]  Calcium Citrate-Vitamin D (CALCIUM CITRATE + D PO) Take 1-2 capsules by mouth See admin instructions. Calcium 200 mg, vitamin D3 2.5 mcg - 100 units - take 1 tablet by mouth with breakfast and 2 tablets with supper    [provider]  carvedilol (COREG) 25 MG tablet Take 12.5 mg by mouth 2 (two) times daily with a meal. 07/14/19   [provider]  Cholecalciferol (VITAMIN D3) 2000 UNITS TABS Take 4,000 Units by mouth daily with supper.    [provider]  Coenzyme Q10 (COQ10) 100 MG CAPS Take 100 mg by mouth daily with supper.    [provider]  docusate sodium (COLACE) 100 MG capsule Take 300 mg by mouth at bedtime as needed (constipation).    [provider]  ferrous sulfate 325 (65 FE) MG EC tablet Take 325 mg by mouth See admin instructions. Take one tablet (325) mg) by mouth every afternoon on an empty stomach 10/05/19   [provider]  glimepiride (AMARYL) 2 MG tablet Take 1 mg by mouth daily before breakfast. 10/19/19   [provider]  Glucosamine-Chondroitin 500-400 MG CAPS Take 1-2 capsules by mouth See admin instructions. Take one capsule by mouth every morning, and two capsules at night    [provider]  ibuprofen (ADVIL) 200  MG tablet Take 400 mg by mouth daily as needed for headache.    [provider]  Lactobacillus (LACTINEX PO) Take 1 tablet by mouth daily.    [provider]  loperamide (IMODIUM) 2 MG capsule Take 2 mg by mouth 4 (four) times daily as needed for diarrhea or loose stools.    [provider]  MAGNESIUM CITRATE PO Take 150 mg by mouth at bedtime.    [provider]  Magnesium Oxide 500 MG TABS Take 500 mg by mouth at bedtime.    [provider]  Menthol, Topical Analgesic, (BIOFREEZE) 4 % GEL Apply 1 application topically 4 (four) times daily as needed (pain).    [provider]  methocarbamol  (ROBAXIN) 500 MG tablet Take 1 tablet (500 mg total) by mouth every 8 (eight) hours as needed for muscle spasms. 11/02/21   Jessy Oto, MD  morphine (MS CONTIN) 15 MG 12 hr tablet Take 1 tablet (15 mg total) by mouth every 12 (twelve) hours. Patient taking differently: Take 15 mg by mouth every 12 (twelve) hours as needed (severe pain). 10/23/21   Jessy Oto, MD  mupirocin ointment (BACTROBAN) 2 % Place 1 application into the nose 3 (three) times daily. 06/16/21   [provider]  Omega-3 Fatty Acids (FISH OIL) 1000 MG CAPS Take 1,000 mg by mouth 2 (two) times daily.    [provider]  OVER THE COUNTER MEDICATION Take 1 capsule by mouth 2 (two) times daily. [place] brain blood vessel relaxation (butterburr root extract)    [provider]  OVER THE COUNTER MEDICATION Apply 1 application topically daily. Leg & back pain relief cream    [provider]  oxyCODONE (OXYCONTIN) 20 mg 12 hr tablet Take 1 tablet (20 mg total) by mouth every 12 (twelve) hours. 11/02/21   Jessy Oto, MD  oxyCODONE 10 MG TABS Take 1 tablet (10 mg total) by mouth every 3 (three) hours as needed for severe pain ((score 7 to 10)). 11/02/21   Jessy Oto, MD  oxyCODONE-acetaminophen (PERCOCET) 5-325 MG tablet Take 1 tablet by mouth every 8 (eight) hours as needed. 10/29/21   Jessy Oto, MD  pantoprazole (PROTONIX) 40 MG tablet Take 40 mg by mouth 2 (two) times daily.    [provider]  Probiotic Product (PROBIOTIC PO) Take 1 capsule by mouth daily with breakfast. SUPREMA DOPHILUS 5 BILLION CFU    [provider]  QUEtiapine (SEROQUEL) 300 MG tablet TAKE 0.5 TABLETS (150 MG TOTAL) BY MOUTH AT BEDTIME. Patient taking differently: Take 150 mg by mouth at bedtime. 09/12/21   Cottle, Billey Co., MD  Red Yeast Rice 600 MG CAPS Take 1,200 mg by mouth 2 (two) times daily.    [provider]  Simethicone 180 MG CAPS Take 180 mg by mouth daily as needed  (gas/bloating).    [provider]  sodium chloride (OCEAN) 0.65 % SOLN nasal spray Place 1 spray into both nostrils 3 (three) times daily.    [provider]  telmisartan (MICARDIS) 80 MG tablet Take 80 mg by mouth at bedtime.    [provider]  tolterodine (DETROL LA) 4 MG 24 hr capsule Take 4 mg by mouth daily with breakfast. 02/20/21   [provider]  trihexyphenidyl (ARTANE) 2 MG tablet TAKE 1 TABLET (2 MG TOTAL) BY MOUTH 3 (THREE) TIMES DAILY WITH MEALS. Patient taking differently: Take 2 mg by mouth 3 (three) times  daily with meals. 06/05/21   Kathrynn Ducking, MD  Turmeric 500 MG CAPS Take 500 mg by mouth 2 (two) times daily with a meal.    [provider]  venlafaxine XR (EFFEXOR-XR) 75 MG 24 hr capsule TAKE 1 CAPSULE BY MOUTH DAILY WITH BREAKFAST. Patient taking differently: 75 mg daily with breakfast. 08/02/21   Kathrynn Ducking, MD      Allergies    Levofloxacin, Amoxicillin, Clarithromycin, and Atropine    Review of Systems   Review of Systems  Cardiovascular:  Positive for leg swelling.  All other systems reviewed and are negative.  Physical Exam Updated Vital Signs BP (!) 148/82    Pulse 85    Temp (!) 97.5 F (36.4 C) (Oral)    Resp (!) 23    Ht 5' 7.5" (1.715 m)    Wt 70.8 kg    SpO2 97%    BMI 24.07 kg/m  Physical Exam Vitals and nursing note reviewed.  Constitutional:      General: She is not in acute distress.    Appearance: Normal appearance.  HENT:     Head: Normocephalic and atraumatic.  Eyes:     General:        Right eye: No discharge.        Left eye: No discharge.  Cardiovascular:     Rate and Rhythm: Normal rate and regular rhythm.     Heart sounds: No murmur heard.   No friction rub. No gallop.     Comments: DP, PT pulses intact bilaterally Pulmonary:     Effort: Pulmonary effort is normal.     Breath sounds: Normal breath sounds.  Abdominal:     General: Bowel sounds are normal.     Palpations:  Abdomen is soft.  Musculoskeletal:     Comments: approximately 1+ bilateral lower extremity edema, left greater than right.  No redness, or focal swelling noted.  Some tenderness to palpation of the left calf.  Skin:    General: Skin is warm and dry.     Capillary Refill: Capillary refill takes less than 2 seconds.  Neurological:     Mental Status: She is alert and oriented to person, place, and time.  Psychiatric:        Mood and Affect: Mood normal.        Behavior: Behavior normal.    ED Results / Procedures / Treatments   Labs (all labs ordered are listed, but only abnormal results are displayed) Labs Reviewed  CBC WITH DIFFERENTIAL/PLATELET - Abnormal; Notable for the following components:      Result Value   RBC 3.17 (*)    Hemoglobin 9.5 (*)    HCT 29.9 (*)    All other components within normal limits  BASIC METABOLIC PANEL - Abnormal; Notable for the following components:   BUN 25 (*)    All other components within normal limits  BRAIN NATRIURETIC PEPTIDE    EKG None  Radiology DG Chest 2 View  Result Date: 11/29/2021 CLINICAL DATA:  Shortness of breath. EXAM: CHEST - 2 VIEW COMPARISON:  Chest x-ray dated November 01, 2021 FINDINGS: Cardiac and mediastinal contours are within normal limits. Inferior costophrenic angles are excluded from the field of view. No large pleural effusion or evidence of pneumothorax. IMPRESSION: No active cardiopulmonary disease. Electronically Signed   By: Yetta Glassman M.D.   On: 11/29/2021 17:21   VAS [place] LOWER EXTREMITY VENOUS (DVT) (7a-7p)  Result Date: 11/29/2021  Lower Venous DVT  Study Patient Name:  KIMI BORDEAU  Date of Exam:   11/29/2021 Medical Rec #: [id number]             Accession #:    [id number] Date of Birth: 05/11/1951              Patient Gender: F Patient Age:   79 years Exam Location:  [facility] Procedure:      VAS [place] LOWER EXTREMITY VENOUS (DVT) Referring Phys: Allegra Cerniglia  --------------------------------------------------------------------------------  Indications: Swelling.  Limitations: Body habitus and poor ultrasound/tissue interface. Comparison Study: No prior study Performing Technologist: Jody Hill RVT, RDMS  Examination Guidelines: A complete evaluation includes B-mode imaging, spectral Doppler, color Doppler, and power Doppler as needed of all accessible portions of each vessel. Bilateral testing is considered an integral part of a complete examination. Limited examinations for reoccurring indications may be performed as noted. The reflux portion of the exam is performed with the patient in reverse Trendelenburg.  +---------+---------------+---------+-----------+----------+--------------+  RIGHT     Compressibility Phasicity Spontaneity Properties Thrombus Aging  +---------+---------------+---------+-----------+----------+--------------+  CFV       Full            Yes       Yes                                    +---------+---------------+---------+-----------+----------+--------------+  SFJ       Full                                                             +---------+---------------+---------+-----------+----------+--------------+  FV Prox   Full                                                             +---------+---------------+---------+-----------+----------+--------------+  FV Mid    Full                                                             +---------+---------------+---------+-----------+----------+--------------+  FV Distal Full                                                             +---------+---------------+---------+-----------+----------+--------------+  PFV       Full                                                             +---------+---------------+---------+-----------+----------+--------------+  POP       Full  Yes       Yes                                     +---------+---------------+---------+-----------+----------+--------------+  PERO      Full                                                             +---------+---------------+---------+-----------+----------+--------------+   Right Technical Findings: Not visualized segments include PTV.  +---------+---------------+---------+-----------+----------+--------------+  LEFT      Compressibility Phasicity Spontaneity Properties Thrombus Aging  +---------+---------------+---------+-----------+----------+--------------+  CFV       Full            Yes       Yes                                    +---------+---------------+---------+-----------+----------+--------------+  SFJ       Full                                                             +---------+---------------+---------+-----------+----------+--------------+  FV Prox   Full                                                             +---------+---------------+---------+-----------+----------+--------------+  FV Mid    Full                                                             +---------+---------------+---------+-----------+----------+--------------+  FV Distal Full                                                             +---------+---------------+---------+-----------+----------+--------------+  PFV       Full                                                             +---------+---------------+---------+-----------+----------+--------------+  POP       Full            Yes       Yes                                    +---------+---------------+---------+-----------+----------+--------------+  PTV       Full                                                             +---------+---------------+---------+-----------+----------+--------------+  PERO      Full                                                             +---------+---------------+---------+-----------+----------+--------------+     Summary: RIGHT: - There is no evidence of deep vein  thrombosis in the lower extremity. However, portions of this examination were limited- see technologist comments above.  - No cystic structure found in the popliteal fossa.  LEFT: - There is no evidence of deep vein thrombosis in the lower extremity.  - No cystic structure found in the popliteal fossa.  *See table(s) above for measurements and observations.    Preliminary     Procedures Procedures    Medications Ordered in ED Medications - No data to display  ED Course/ Medical Decision Making/ A&P Clinical Course as of 11/29/21 2042  Thu Nov 29, 2021  1658 Negative for DVT bilaterally per [place] tech [CP]    Clinical Course User Index [CP] Anselmo Pickler, PA-C                           Medical Decision Making Amount and/or Complexity of Data Reviewed Labs: ordered. Radiology: ordered. ECG/medicine tests: ordered.  Risk Prescription drug management.   This patient presents to the ED for concern of bilateral lower extremity edema, left greater than right, as well as calf pain, this involves an extensive number of treatment options, and is a complaint that carries with it a high risk of complications and morbidity. The emergent differential diagnosis prior to evaluation includes, but is not limited to, acute DVT, cellulitis, heart failure, liver failure, kidney failure, injury, swelling, inflammation versus other.  This is not an exhaustive differential.  Patient without any signs or symptoms of chest pain, shortness of breath but possible if patient has DVT for progression to pulmonary embolism.   Past Medical History / Co-morbidities: Parkinson's with tremor, status post lumbar spinal fusion  Additional history: Additional history obtained from patient's husband. External records from outside source obtained and reviewed including recent orthopedic operative notes, and outpatient visits.  Physical Exam: Physical exam performed. The pertinent findings include: Lower extremity  edema as noted in physical exam, with no other significant signs or symptoms.  Otherwise well-appearing patient wearing back brace.   Lab Tests: I ordered, and personally interpreted labs.  The pertinent results include: Anemia at 9.5 but is improved from around 8 during recent admission.  Overall unremarkable BMP, negative BNP   Imaging Studies: I ordered imaging studies including plain film chest x-ray, bilateral lower extremity ultrasound. I independently visualized and interpreted imaging which showed no acute intrathoracic abnormality, no evidence of DVT bilaterally. I agree with the radiologist interpretation.   Cardiac Monitoring:  The patient was maintained on a cardiac monitor.  My attending physician Dr. Johnney Killian viewed and interpreted the cardiac monitored which showed  an underlying rhythm of: Patient in normal sinus rhythm, her initial EKG is read as atrial fibrillation, however she is tremulous at baseline due to her Parkinson's, her RR interval is regular, and I can see P waves and T waves.  In the fibrillatory baseline from her tremor.   Disposition: After consideration of the diagnostic results and the patients response to treatment, I feel that patient does have some lower extremity edema, is possible that is related to her recent surgery, I do not see any evidence of acute heart failure, kidney failure, or signs and symptoms of liver failure at this time.  She has no evidence of DVT.  She has no evidence of cellulitis.  Discussed recommend short course of Lasix, and close follow-up with PCP.  Patient understands and agrees to plan, discharged in stable condition at this time.   Final Clinical Impression(s) / ED Diagnoses Final diagnoses:  Leg swelling    Rx / DC Orders ED Discharge Orders          Ordered    furosemide (LASIX) 20 MG tablet  Daily        11/29/21 1913              Dorien Chihuahua 11/29/21 2043    Hayden Rasmussen, MD 11/30/21  [phone]

## 2021-11-29 NOTE — ED Notes (Signed)
Vascular at the bedside. 

## 2021-11-29 NOTE — ED Notes (Signed)
Pt in imaging at this time.

## 2021-11-29 NOTE — Telephone Encounter (Signed)
Patient called. I spoke to Dr Louanne Skye and he suggested she go to thr ER since she declined any scheduled appointments. She reluctantly agreed to go, however ,she does not want to wait.  ?

## 2021-11-29 NOTE — ED Notes (Signed)
PA-C at the bedside to evaluate.  

## 2021-11-29 NOTE — Telephone Encounter (Signed)
I called and spoke with patient advised that we need to rule out a DVT in her legs since she is postop. She asked what that has to do with anything, I advised that if there is a clot that we need to get it treated, otherwise it can go to the  heart/lungs and potentially kill her if this is the case, otherwise, if it is negative and it is just edema then she will need to contact her PCP to have this evaluated and treated as this would be considered a primary issue at that to get on some lasix. She states that she really thinks that it is just the edema cant we just treat her for that, I advised that we have to make sure so that it doesn't turn out to be he DVT. I advised that I would talk to the procedure schedulers and see if they can get her in this afternoon. ----As I am typing this message she called the answering service and they called the triage line--please see attached message from [place] ?

## 2021-12-03 ENCOUNTER — Other Ambulatory Visit: Payer: Self-pay | Admitting: Specialist

## 2021-12-03 NOTE — Telephone Encounter (Signed)
Pt asking for refill of oxycodone, morphine. Please call pt about this medication for refills. Please send to pharmacy on file. Pt phone number is [phone]. ?

## 2021-12-05 ENCOUNTER — Other Ambulatory Visit: Payer: Self-pay | Admitting: Surgery

## 2021-12-05 MED ORDER — OXYCODONE-ACETAMINOPHEN 5-325 MG PO TABS: 1.0000 | ORAL_TABLET | Freq: Three times a day (TID) | ORAL | 0 refills | Status: DC | PRN

## 2021-12-05 MED ORDER — MORPHINE SULFATE ER 15 MG PO TBCR: 15.0000 mg | EXTENDED_RELEASE_TABLET | Freq: Two times a day (BID) | ORAL | 0 refills | Status: DC | PRN

## 2021-12-05 NOTE — Telephone Encounter (Signed)
IC patient LM advising ?

## 2021-12-17 ENCOUNTER — Ambulatory Visit: Payer: Medicare Other | Admitting: Diagnostic Neuroimaging

## 2021-12-18 ENCOUNTER — Telehealth: Payer: Self-pay | Admitting: Psychiatry

## 2021-12-18 NOTE — Telephone Encounter (Signed)
Bobby called in at [facility] to request an appt with Dr. [name]. It was confirmed ok to schedule and while trying to make her appt, she got very testy with the receptionist. I got on the phone and talked to her. Police were called to her house this am at 5am. Her husband called them- they were fighting and she was angry. I asked if she felt manic ( because she seemed manic) and she said no. She has not slept all night, did not take her meds to sleep b/c she knew she would be up fighting with H. Only sleeps 3  to 5 hrs lately. She is taking Amitryptylene and Seroquel still.  She and H have Parkinsons and she has had multiple back surgeries and is in chronic pain. Takes pain meds. She did say she is the energizer bunny but she's not manic because she can just turn it off.She was calm by the end of the conversation but I told her I would need to see when Dr. [name] could see her and I would call her back. She expressed the need to go to sleep and told me to leave a message for her. ?

## 2021-12-20 ENCOUNTER — Ambulatory Visit: Payer: Medicare Other | Admitting: Surgery

## 2021-12-21 ENCOUNTER — Ambulatory Visit: Payer: Self-pay | Admitting: Cardiology

## 2021-12-21 NOTE — Progress Notes (Deleted)
? ? ?Patient referred by Aletha Halim., PA-C for *** ? ?Subjective:  ? ?Kristin Gilmore, female    DOB: 1950-11-07, 71 y.o.   MRN: [id number] ? ?*** ?No chief complaint on file. ? ? ?*** ?HPI ? ?71 y.o. *** female with *** ? ?*** ?Past Medical History:  ?Diagnosis Date  ? Arthritis   ? Depression   ? Dyslipidemia   ? Hypertension   ? Migraine   ? Migraine without aura, without mention of intractable migraine without mention of status migrainosus 04/13/2014  ? Parkinson's disease ([place]) 01/24/2021  ? 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 Pneumonia   ? Pre-diabetes   ? "i was told i was pre-diabetic a while ago"  ? Tremor 04/13/2014  ? ? ?*** ?Past Surgical History:  ?Procedure Laterality Date  ? CHOLECYSTECTOMY    ? COLONOSCOPY W/ POLYPECTOMY    ? LUMBAR LAMINECTOMY  07/2020  ? 3 level decompression - [place], [place]  ? LUMBAR LAMINECTOMY/DECOMPRESSION MICRODISCECTOMY N/A 12/19/2017  ? Procedure: LAMINECTOMY LUMBAR TWO- LUMBAR THREE, LUMBAR THREE- LUMBAR FOUR, LUMBAR FOUR- LUMBAR FIVE ;  Surgeon: Consuella Lose, MD;  Location: [facility];  Service: Neurosurgery;  Laterality: N/A;  ? NASAL SINUS SURGERY    ? TONSILLECTOMY    ? TOTAL KNEE ARTHROPLASTY Right 06/26/2021  ? Procedure: RIGHT TOTAL KNEE ARTHROPLASTY;  Surgeon: Leandrew Koyanagi, MD;  Location: [facility];  Service: Orthopedics;  Laterality: Right;  ? WISDOM TOOTH EXTRACTION    ? ? ?*** ?Social History  ? ?Tobacco Use  ?Smoking Status Never  ?Smokeless Tobacco Never  ? ? ?Social History  ? ?Substance and Sexual Activity  ?Alcohol Use Yes  ? Comment: occasional glass of wine  ? ? ?*** ?Family History  ?Problem Relation Age of Onset  ? Cancer Father   ?     stomach cancer  ? Migraines Mother   ? ? ?*** ? ?Current Outpatient Medications:  ?  Allantoin 0.5 % GEL, Apply 1 application topically 2 (two) times daily., Disp: , Rfl:  ?  ALPRAZolam (XANAX) 0.25 MG tablet, Take 1 tablet (0.25 mg total) by mouth 2 (two) times daily as needed for anxiety., Disp: 30 tablet,  Rfl: 0 ?  amitriptyline (ELAVIL) 10 MG tablet, Take 3 tablets (30 mg total) by mouth at bedtime., Disp: 270 tablet, Rfl: 1 ?  amLODipine (NORVASC) 5 MG tablet, Take 5 mg by mouth 2 (two) times daily., Disp: , Rfl:  ?  aspirin EC 81 MG tablet, Take 1 tablet (81 mg total) by mouth 2 (two) times daily. (Patient taking differently: Take 81 mg by mouth daily with breakfast.), Disp: 84 tablet, Rfl: 0 ?  Biotin 5000 MCG CAPS, Take 5,000 mcg by mouth every morning., Disp: , Rfl:  ?  bisacodyl (DULCOLAX) 5 MG EC tablet, Take 5-15 mg by mouth daily as needed (constipation)., Disp: , Rfl:  ?  Calcium Citrate-Vitamin D (CALCIUM CITRATE + D PO), Take 1-2 capsules by mouth See admin instructions. Calcium 200 mg, vitamin D3 2.5 mcg - 100 units - take 1 tablet by mouth with breakfast and 2 tablets with supper, Disp: , Rfl:  ?  carvedilol (COREG) 25 MG tablet, Take 12.5 mg by mouth 2 (two) times daily with a meal., Disp: , Rfl:  ?  Cholecalciferol (VITAMIN D3) 2000 UNITS TABS, Take 4,000 Units by mouth daily with supper., Disp: , Rfl:  ?  Coenzyme Q10 (COQ10) 100 MG CAPS, Take 100 mg by mouth daily with supper., Disp: , Rfl:  ?  docusate sodium (COLACE) 100 MG capsule, Take 300 mg by mouth at bedtime as needed (constipation)., Disp: , Rfl:  ?  ferrous sulfate 325 (65 FE) MG EC tablet, Take 325 mg by mouth See admin instructions. Take one tablet (325) mg) by mouth every afternoon on an empty stomach, Disp: , Rfl:  ?  furosemide (LASIX) 20 MG tablet, Take 1 tablet (20 mg total) by mouth daily., Disp: 10 tablet, Rfl: 0 ?  glimepiride (AMARYL) 2 MG tablet, Take 1 mg by mouth daily before breakfast., Disp: , Rfl:  ?  Glucosamine-Chondroitin 500-400 MG CAPS, Take 1-2 capsules by mouth See admin instructions. Take one capsule by mouth every morning, and two capsules at night, Disp: , Rfl:  ?  ibuprofen (ADVIL) 200 MG tablet, Take 400 mg by mouth daily as needed for headache., Disp: , Rfl:  ?  Lactobacillus (LACTINEX PO), Take 1 tablet by  mouth daily., Disp: , Rfl:  ?  loperamide (IMODIUM) 2 MG capsule, Take 2 mg by mouth 4 (four) times daily as needed for diarrhea or loose stools., Disp: , Rfl:  ?  MAGNESIUM CITRATE PO, Take 150 mg by mouth at bedtime., Disp: , Rfl:  ?  Magnesium Oxide 500 MG TABS, Take 500 mg by mouth at bedtime., Disp: , Rfl:  ?  Menthol, Topical Analgesic, (BIOFREEZE) 4 % GEL, Apply 1 application topically 4 (four) times daily as needed (pain)., Disp: , Rfl:  ?  methocarbamol (ROBAXIN) 500 MG tablet, Take 1 tablet (500 mg total) by mouth every 8 (eight) hours as needed for muscle spasms., Disp: 30 tablet, Rfl: 1 ?  morphine (MS CONTIN) 15 MG 12 hr tablet, Take 1 tablet (15 mg total) by mouth every 12 (twelve) hours as needed (severe pain)., Disp: 15 tablet, Rfl: 0 ?  mupirocin ointment (BACTROBAN) 2 %, Place 1 application into the nose 3 (three) times daily., Disp: , Rfl:  ?  Omega-3 Fatty Acids (FISH OIL) 1000 MG CAPS, Take 1,000 mg by mouth 2 (two) times daily., Disp: , Rfl:  ?  OVER THE COUNTER MEDICATION, Take 1 capsule by mouth 2 (two) times daily. [place] brain blood vessel relaxation (butterburr root extract), Disp: , Rfl:  ?  OVER THE COUNTER MEDICATION, Apply 1 application topically daily. Leg & back pain relief cream, Disp: , Rfl:  ?  oxyCODONE (OXYCONTIN) 20 mg 12 hr tablet, Take 1 tablet (20 mg total) by mouth every 12 (twelve) hours., Disp: 14 tablet, Rfl: 0 ?  oxyCODONE 10 MG TABS, Take 1 tablet (10 mg total) by mouth every 3 (three) hours as needed for severe pain ((score 7 to 10))., Disp: 30 tablet, Rfl: 0 ?  oxyCODONE-acetaminophen (PERCOCET) 5-325 MG tablet, Take 1 tablet by mouth every 8 (eight) hours as needed., Disp: 40 tablet, Rfl: 0 ?  pantoprazole (PROTONIX) 40 MG tablet, Take 40 mg by mouth 2 (two) times daily., Disp: , Rfl:  ?  Probiotic Product (PROBIOTIC PO), Take 1 capsule by mouth daily with breakfast. SUPREMA DOPHILUS 5 BILLION CFU, Disp: , Rfl:  ?  QUEtiapine (SEROQUEL) 300 MG tablet,  TAKE 0.5 TABLETS (150 MG TOTAL) BY MOUTH AT BEDTIME. (Patient taking differently: Take 150 mg by mouth at bedtime.), Disp: 45 tablet, Rfl: 1 ?  Red Yeast Rice 600 MG CAPS, Take 1,200 mg by mouth 2 (two) times daily., Disp: , Rfl:  ?  Simethicone 180 MG CAPS, Take 180 mg by mouth daily as needed (gas/bloating)., Disp: , Rfl:  ?  sodium chloride (OCEAN) 0.65 % SOLN nasal spray, Place 1 spray into both nostrils 3 (three) times daily., Disp: , Rfl:  ?  telmisartan (MICARDIS) 80 MG tablet, Take 80 mg by mouth at bedtime., Disp: , Rfl:  ?  tolterodine (DETROL LA) 4 MG 24 hr capsule, Take 4 mg by mouth daily with breakfast., Disp: , Rfl:  ?  trihexyphenidyl (ARTANE) 2 MG tablet, TAKE 1 TABLET (2 MG TOTAL) BY MOUTH 3 (THREE) TIMES DAILY WITH MEALS. (Patient taking differently: Take 2 mg by mouth 3 (three) times daily with meals.), Disp: 270 tablet, Rfl: 1 ?  Turmeric 500 MG CAPS, Take 500 mg by mouth 2 (two) times daily with a meal., Disp: , Rfl:  ?  venlafaxine XR (EFFEXOR-XR) 75 MG 24 hr capsule, TAKE 1 CAPSULE BY MOUTH DAILY WITH BREAKFAST. (Patient taking differently: 75 mg daily with breakfast.), Disp: 90 capsule, Rfl: 0 ? ? ?Cardiovascular and other pertinent studies: ? ?Reviewed external labs and tests, independently interpreted ? ?*** ?EKG ***/***/202***: ?*** ? ?*** ? ?*** ?Recent labs: ?11/29/2021: ?Glucose 86, BUN/Cr 25/0.93. EGFR >60. Na/K 138/4.1. Rest of the CMP normal ?H/H 9.5/2.9.9. MCV 94. Platelets 364 ?HbA1C 5.8% ? ? ?*** ?ROS ? ?   ? ?*** ?There were no vitals filed for this visit. ? ? ?There is no height or weight on file to calculate BMI. ?There were no vitals filed for this visit. ? ?*** ?Objective:  ? Physical Exam ? ? ? ?*** ?   ? ?Visit diagnoses: ?No diagnosis found.  ? ?No orders of the defined types were placed in this encounter. ?  ? ?Medication changes this visit: ?There are no discontinued medications.  ?No orders of the defined types were placed in this encounter. ?  ? ?Assessment &  Recommendations:  ? ?*** ? ?*** ? ?Thank you for referring the patient to [place]. Please feel free to contact with any questions. ? ? ?Nigel Mormon, MD ?Pager: [phone] ?Office: [phone] ?

## 2021-12-25 ENCOUNTER — Telehealth: Payer: Self-pay | Admitting: Specialist

## 2021-12-25 NOTE — Telephone Encounter (Signed)
Received call from Lottie Dawson (PT) with [facility] stating that patient refused HHPT last month and want to set up HHPT now. Doug asked if Dr. Louanne Skye will order HHPT for the patient?  Marden Noble said the order they have is outdated now. The number to contact Marden Noble is [phone] ?

## 2021-12-26 NOTE — Telephone Encounter (Signed)
Printed for Dr. Louanne Skye to review and advise ?

## 2021-12-27 ENCOUNTER — Ambulatory Visit: Payer: Medicare Other | Admitting: Psychiatry

## 2021-12-27 ENCOUNTER — Telehealth: Payer: Self-pay | Admitting: Specialist

## 2021-12-27 NOTE — Telephone Encounter (Signed)
Doug from [facility] called. Says they have not received the order for home PT. His call back number is [phone] ?

## 2021-12-28 ENCOUNTER — Other Ambulatory Visit: Payer: Self-pay | Admitting: Specialist

## 2021-12-28 DIAGNOSIS — Z981 Arthrodesis status: Secondary | ICD-10-CM

## 2021-12-28 NOTE — Telephone Encounter (Signed)
This is pending with Dr. Louanne Skye, see other message ?

## 2021-12-31 ENCOUNTER — Telehealth: Payer: Self-pay | Admitting: Specialist

## 2021-12-31 NOTE — Telephone Encounter (Signed)
Submitted 12/28/21 ?

## 2021-12-31 NOTE — Telephone Encounter (Signed)
Received call from [facility] with [facility] needing verbal orders for Nursing and HHPT.   Previous orders were given but patient refused having [facility] come in back in February.    The number to contact Marissa is [phone]  ?

## 2021-12-31 NOTE — Telephone Encounter (Signed)
I called and gave verbal, advised order was placed for this on Friday ?

## 2022-01-07 ENCOUNTER — Ambulatory Visit: Payer: Medicare Other | Admitting: Specialist

## 2022-01-15 ENCOUNTER — Ambulatory Visit: Payer: Medicare Other | Admitting: Orthopaedic Surgery

## 2022-01-15 ENCOUNTER — Telehealth: Payer: Self-pay | Admitting: Neurology

## 2022-01-15 NOTE — Telephone Encounter (Addendum)
Reviewed past phone note. Dr Jaynee Eagles does not specialize in parkinsonism. She had reviewed pt's chart and pt was to instead see someone on the parkinson's team and was scheduled with Dr Leta Baptist.  ?
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
## 2022-01-16 NOTE — Telephone Encounter (Signed)
I called pt and left a message with the pt's husband to call back. Per notes in epic pt was scheduled with [facility] on 10/29/21 and 12/17/21. Both of these appts were c/a by the pt. Our office c/a on 10/10/21 with Dr. Jaynee Eagles due to the fact she does not specialize in PD. If pt calls back TE staff can relay and advise pt has appt on 03/19/22 that we would be happy to keep as scheduled.  ?

## 2022-01-17 ENCOUNTER — Telehealth: Payer: Self-pay | Admitting: Psychiatry

## 2022-01-17 NOTE — Telephone Encounter (Signed)
See messages please.  ?

## 2022-01-17 NOTE — Telephone Encounter (Signed)
Called patient and she said she was tired of getting the runaround, that she didn't know me and wanted to talk with Dr. [name]. She would not provide me any information as to why she was calling.  ?
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
## 2022-01-21 ENCOUNTER — Telehealth: Payer: Self-pay | Admitting: Neurology

## 2022-01-21 NOTE — Telephone Encounter (Signed)
Pt called and only wanted to speak with front desk. Would not disclose the matter stated it was personal and I could not help her. ?Stated only those at the front desk could help her. Requested those at the front call her back.  ?

## 2022-01-28 ENCOUNTER — Ambulatory Visit: Payer: Medicare Other | Admitting: Specialist

## 2022-01-29 ENCOUNTER — Other Ambulatory Visit: Payer: Self-pay | Admitting: Neurology

## 2022-01-30 ENCOUNTER — Telehealth: Payer: Self-pay | Admitting: Orthopaedic Surgery

## 2022-01-30 NOTE — Telephone Encounter (Signed)
Pt is calling to get an appointment asap as she cant get out of bed- I offered next week and she said that was not good enough  ? ?Wants a nurse to call  ?

## 2022-01-31 ENCOUNTER — Other Ambulatory Visit: Payer: Self-pay | Admitting: *Deleted

## 2022-01-31 MED ORDER — TRIHEXYPHENIDYL HCL 2 MG PO TABS: 2.0000 mg | ORAL_TABLET | Freq: Three times a day (TID) | ORAL | 0 refills | Status: DC

## 2022-01-31 NOTE — Telephone Encounter (Signed)
Called patient both numbers again no answer. ? ?

## 2022-01-31 NOTE — Telephone Encounter (Signed)
Called patient no answer LMOM on both numbers. She can come in Friday AM at 10 am per Dr. Erlinda Hong. Appt made.  ? ?Patient needs to confirm if yes or no.  ? ? ?

## 2022-01-31 NOTE — Telephone Encounter (Signed)
She can come in friday

## 2022-02-01 ENCOUNTER — Ambulatory Visit: Payer: Medicare Other | Admitting: Orthopaedic Surgery

## 2022-02-06 ENCOUNTER — Other Ambulatory Visit: Payer: Self-pay | Admitting: Neurology

## 2022-02-12 ENCOUNTER — Emergency Department (HOSPITAL_BASED_OUTPATIENT_CLINIC_OR_DEPARTMENT_OTHER)
Admission: EM | Admit: 2022-02-12 | Discharge: 2022-02-12 | Disposition: A | Discharge status: Home / Self Care | Payer: Medicare Other | Attending: Emergency Medicine | Admitting: Emergency Medicine

## 2022-02-12 ENCOUNTER — Emergency Department (HOSPITAL_BASED_OUTPATIENT_CLINIC_OR_DEPARTMENT_OTHER): Payer: Medicare Other | Admitting: Radiology

## 2022-02-12 ENCOUNTER — Encounter (HOSPITAL_BASED_OUTPATIENT_CLINIC_OR_DEPARTMENT_OTHER): Payer: Self-pay

## 2022-02-12 ENCOUNTER — Other Ambulatory Visit: Payer: Self-pay

## 2022-02-12 DIAGNOSIS — Z7982 Long term (current) use of aspirin: Secondary | ICD-10-CM | POA: Insufficient documentation

## 2022-02-12 DIAGNOSIS — M25561 Pain in right knee: Secondary | ICD-10-CM | POA: Insufficient documentation

## 2022-02-12 DIAGNOSIS — Z96651 Presence of right artificial knee joint: Secondary | ICD-10-CM | POA: Diagnosis not present

## 2022-02-12 DIAGNOSIS — G20 Parkinson's disease: Secondary | ICD-10-CM | POA: Insufficient documentation

## 2022-02-12 MED ORDER — TRAMADOL HCL 50 MG PO TABS
50.0000 mg | ORAL_TABLET | Freq: Four times a day (QID) | ORAL | 0 refills | Status: AC | PRN
Start: 2022-02-12 — End: 2022-02-15

## 2022-02-12 MED ORDER — FENTANYL CITRATE PF 50 MCG/ML IJ SOSY
50.0000 ug | PREFILLED_SYRINGE | Freq: Once | INTRAMUSCULAR | Status: AC
  Administered 2022-02-12: 50 ug via INTRAMUSCULAR
  Filled 2022-02-12: qty 1

## 2022-02-12 NOTE — ED Triage Notes (Signed)
Pt comes via [facility] from home for R knee pain that has been going on all day, denies injury.  ?

## 2022-02-12 NOTE — Discharge Instructions (Signed)
Follow up with ortho this week ?
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
## 2022-02-13 ENCOUNTER — Telehealth: Payer: Self-pay | Admitting: Specialist

## 2022-02-13 NOTE — Telephone Encounter (Signed)
She has an appointment with Dr. Erlinda Hong tomorrow-----PER DR. NITKA she is to see Dr. Erlinda Hong as he is the one that did her surgery and needs to follow up with him for the right knee ?

## 2022-02-13 NOTE — Telephone Encounter (Signed)
Ms. Kristin Gilmore is currently in the ER at [facility]. She was transported there due to extreme knee pain. She was instructed per the ER physicians to see Dr. Louanne Skye ASAP. Could you please f/u with to get her scheduled with an appointment.  ?

## 2022-02-14 ENCOUNTER — Ambulatory Visit (INDEPENDENT_AMBULATORY_CARE_PROVIDER_SITE_OTHER): Payer: Medicare Other | Admitting: Orthopaedic Surgery

## 2022-02-14 DIAGNOSIS — M25561 Pain in right knee: Secondary | ICD-10-CM | POA: Diagnosis not present

## 2022-02-14 MED ORDER — SULFAMETHOXAZOLE-TRIMETHOPRIM 800-160 MG PO TABS: 1.0000 | ORAL_TABLET | Freq: Two times a day (BID) | ORAL | 0 refills | Status: DC

## 2022-02-14 MED ORDER — METHYLPREDNISOLONE 4 MG PO TBPK: ORAL_TABLET | ORAL | 0 refills | Status: DC

## 2022-02-14 NOTE — Progress Notes (Signed)
Office Visit Note   Patient: Kristin Gilmore           Date of Birth: 10-23-1950           MRN: [id number] Visit Date: 02/14/2022              Requested by: Aletha Halim., PA-C [address],  [place] [address] PCP: Aletha Halim., PA-C   Assessment & Plan: Visit Diagnoses:  1. Acute pain of right knee     Plan: Impression is prepatellar bursitis.  Low suspicion for infection or septic joint.  Will send in prescription for Medrol Dosepak and empiric Bactrim.  Follow-up in about 2 weeks if no improvement.  Follow-Up Instructions: No follow-ups on file.   Orders:  No orders of the defined types were placed in this encounter.  Meds ordered this encounter  Medications   methylPREDNISolone (MEDROL DOSEPAK) 4 MG TBPK tablet    Sig: Take as directed    Dispense:  21 tablet    Refill:  0   sulfamethoxazole-trimethoprim (BACTRIM DS) 800-160 MG tablet    Sig: Take 1 tablet by mouth 2 (two) times daily.    Dispense:  20 tablet    Refill:  0      Procedures: No procedures performed   Clinical Data: No additional findings.   Subjective: No chief complaint on file.   HPI Kristin Gilmore is a 71 year old female here for evaluation of cute onset of right knee pain in the last couple days.  Denies any injuries.  Denies any constitutional symptoms.  Went to the ER 2 days ago and vital signs were normal.  X-rays were normal.  She was given instructions to follow-up with me.  She states that she was getting up from her kitchen table and had immediate pain.  She has been able to continue to walk with a rollator.  Review of Systems  Constitutional: Negative.   HENT: Negative.    Eyes: Negative.   Respiratory: Negative.    Cardiovascular: Negative.   Endocrine: Negative.   Musculoskeletal: Negative.   Neurological: Negative.   Hematological: Negative.   Psychiatric/Behavioral: Negative.    All other systems reviewed and are negative.   Objective: Vital  Signs: There were no vitals taken for this visit.  Physical Exam Vitals and nursing note reviewed.  Constitutional:      Appearance: She is well-developed.  Pulmonary:     Effort: Pulmonary effort is normal.  Skin:    General: Skin is warm.     Capillary Refill: Capillary refill takes less than 2 seconds.  Neurological:     Mental Status: She is alert and oriented to person, place, and time.  Psychiatric:        Behavior: Behavior normal.        Thought Content: Thought content normal.        Judgment: Judgment normal.    Ortho Exam Examination right knee shows slight warmth to touch.  The prepatellar bursa is tender to palpation.  There is no fluctuance.  Slight redness but not like cellulitis.  No joint effusion.  Range of motion of the joint is well-tolerated.  She is just tender to touch to the bursa. Specialty Comments:  PER Dr. Fabio Bering patient needs to be seen for her RIGHT Knee- she should be following up with Dr. Erlinda Hong   Imaging: No results found.   PMFS History: Patient Active Problem List   Diagnosis Date Noted   Other spondylosis with  radiculopathy, lumbar region 10/30/2021   Other secondary scoliosis, lumbar region 10/30/2021   Spondylolisthesis, lumbar region 10/30/2021   Spondylolisthesis of lumbosacral region 10/30/2021   Status post total right knee replacement 06/26/2021   Primary osteoarthritis of right knee 06/25/2021   Parkinson's disease ([facility]) 01/24/2021   Trochanteric bursitis, left hip 01/18/2020   Lumbar spinal stenosis 12/19/2017   Pain in joint, ankle and foot 05/02/2016   Chronic venous insufficiency 05/02/2016   Peripheral edema 11/01/2015   Intractable chronic migraine without aura 11/02/2014   Tremor 04/13/2014   Migraine without aura 04/13/2014   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   Past Medical History:  Diagnosis Date   Arthritis    Depression    Dyslipidemia    Hypertension    Migraine    Migraine without aura, without  mention of intractable migraine without mention of status migrainosus 04/13/2014   Parkinson's disease ([facility]) 01/24/2021   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   Pneumonia    Pre-diabetes    "i was told i was pre-diabetic a while ago"   Tremor 04/13/2014    Family History  Problem Relation Age of Onset   Cancer Father        stomach cancer   Migraines Mother     Past Surgical History:  Procedure Laterality Date   CHOLECYSTECTOMY     COLONOSCOPY W/ POLYPECTOMY     LUMBAR LAMINECTOMY  07/2020   3 level decompression - [place], [place]   LUMBAR LAMINECTOMY/DECOMPRESSION MICRODISCECTOMY N/A 12/19/2017   Procedure: LAMINECTOMY LUMBAR TWO- LUMBAR THREE, LUMBAR THREE- LUMBAR FOUR, LUMBAR FOUR- LUMBAR FIVE ;  Surgeon: Consuella Lose, MD;  Location: [facility];  Service: Neurosurgery;  Laterality: N/A;   NASAL SINUS SURGERY     TONSILLECTOMY     TOTAL KNEE ARTHROPLASTY Right 06/26/2021   Procedure: RIGHT TOTAL KNEE ARTHROPLASTY;  Surgeon: Leandrew Koyanagi, MD;  Location: [facility];  Service: Orthopedics;  Laterality: Right;   WISDOM TOOTH EXTRACTION     Social History   Occupational History   Occupation: Retired  Tobacco Use   Smoking status: Never   Smokeless tobacco: Never  Vaping Use   Vaping Use: Never used  Substance and Sexual Activity   Alcohol use: Yes    Comment: occasional glass of wine   Drug use: No   Sexual activity: Not on file

## 2022-02-17 ENCOUNTER — Telehealth: Payer: Self-pay | Admitting: Surgical
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
## 2022-02-18 ENCOUNTER — Telehealth: Payer: Self-pay | Admitting: Orthopaedic Surgery

## 2022-02-18 ENCOUNTER — Other Ambulatory Visit: Payer: Self-pay | Admitting: Physician Assistant

## 2022-02-18 MED ORDER — METHYLPREDNISOLONE 4 MG PO TBPK: ORAL_TABLET | ORAL | 0 refills | Status: DC

## 2022-02-18 NOTE — Telephone Encounter (Signed)
Pt asking for Dr. Erlinda Hong please call her back at [phone]

## 2022-02-18 NOTE — Telephone Encounter (Signed)
sent 

## 2022-02-18 NOTE — Telephone Encounter (Signed)
Tried to call and let patient know. No answer. No voicemail.

## 2022-02-18 NOTE — Telephone Encounter (Signed)
Patient called advised her medication (Prednisone)was misplaced when EMS came to her home. Patient asked if the Rx can be refilled and sent to her pharmacy? Patient had [employer] at her home at the time of the call.  The number to contact patient is [phone]

## 2022-02-19 NOTE — Telephone Encounter (Signed)
She's welcome to send me a [employer].  Thanks.

## 2022-02-26 ENCOUNTER — Other Ambulatory Visit: Payer: Self-pay | Admitting: *Deleted

## 2022-02-26 NOTE — Telephone Encounter (Addendum)
I was transferred call from pt who was screaming that she has a migraine and needs sumatriptan '100mg'$  tabs 05-2021 that Dr. Jannifer Franklin prescriibed. She lost her husband 2 days ago and is out of her sumatriptan and has a bad headache. I relayed my sympathies to her.   She was screaming/ rude and I relayed that I was trying to help her but all she wanted was the sumatriptan or would the tramadol help that she received 02-12-22 would help I relayed I see that rx but was not certain why she was given that, but did relay that it could help.  I relayed if she continued screaming /rude I would not listen and hang up.  She continued so I did hang up.

## 2022-02-28 ENCOUNTER — Telehealth: Payer: Self-pay | Admitting: Orthopaedic Surgery

## 2022-02-28 MED ORDER — SUMATRIPTAN SUCCINATE 100 MG PO TABS: 100.0000 mg | ORAL_TABLET | Freq: Two times a day (BID) | ORAL | 0 refills | Status: DC | PRN

## 2022-02-28 NOTE — Telephone Encounter (Signed)
I can't prescribe oxycodone based on hearsay from another provider.

## 2022-02-28 NOTE — Telephone Encounter (Signed)
She's welcome to come in for an appointment.  Not much I can do over the phone.

## 2022-02-28 NOTE — Telephone Encounter (Signed)
Pt said I need medication for my migraine, husband passed away. Pt is yelling in the phone.  Ask pt for her birthday, she responded I have been a patient there don't you know my birthday.  Informed pt I speak with a lot of patients and cannot remember everyone's birthday. Pt preceded to give me her birthday and said I need my sumatriptan now. Having a migraine, I'm going to get off the phone with you and you tell someone to send my Sumatriptan. [facility]. She hung up.

## 2022-02-28 NOTE — Addendum Note (Signed)
Addended by: Brandon Melnick on: 02/28/2022 05:17 PM   Modules accepted: Orders

## 2022-02-28 NOTE — Telephone Encounter (Signed)
Pt also states she called Dr. Lorin Mercy who was on call. Dr. Lorin Mercy stated to her that she need to be on pain medication. Please send to [facility]. Pt states her husband past away and have no way to get to and from her appts. Pt asked for oxycodone but do not send morfin. She don't like it. States tramadol really don't help her pain

## 2022-02-28 NOTE — Telephone Encounter (Signed)
Pt called requesting a cal back from Dr. Erlinda Hong. Pt states her knee is no better after her surgery. Please call pt at [phone]

## 2022-02-28 NOTE — Telephone Encounter (Signed)
This pt has been prescribed sumatriptan '100mg'$  from Dr Jannifer Franklin 06/2021.

## 2022-03-01 ENCOUNTER — Telehealth: Payer: Self-pay | Admitting: Specialist

## 2022-03-01 ENCOUNTER — Telehealth: Payer: Self-pay | Admitting: Orthopaedic Surgery
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
## 2022-03-01 NOTE — Telephone Encounter (Signed)
Patient returned call asked for a call back. Please see previous message. The number to contact patient [phone]

## 2022-03-01 NOTE — Telephone Encounter (Signed)
Lauren, Can you please call her?
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
## 2022-03-01 NOTE — Telephone Encounter (Signed)
Tried calling both numbers again-patient answered home phone stating I should call her on cell phone-tried to explain I had done that earlier and no one answer. Patient hung up on me. I tried calling cell number and once again, no one answered.

## 2022-03-01 NOTE — Telephone Encounter (Signed)
I called home number listed on file and lady answered stating difficult to hear on home phone to please call patient on her cell at [phone].  Tried calling patient-no answer and no VM to LM.

## 2022-03-01 NOTE — Telephone Encounter (Signed)
Lauren can you please call her

## 2022-03-04 ENCOUNTER — Other Ambulatory Visit: Payer: Self-pay | Admitting: Physician Assistant

## 2022-03-04 ENCOUNTER — Telehealth: Payer: Self-pay | Admitting: Specialist

## 2022-03-04 ENCOUNTER — Encounter: Payer: Self-pay | Admitting: Neurology

## 2022-03-04 MED ORDER — TRAMADOL HCL 50 MG PO TABS: 50.0000 mg | ORAL_TABLET | Freq: Two times a day (BID) | ORAL | 2 refills | Status: DC | PRN

## 2022-03-04 NOTE — Telephone Encounter (Signed)
I called and advised that Ria Comment sent in Tramadol for her to the pharmacy

## 2022-03-04 NOTE — Telephone Encounter (Signed)
Patient called advised she is out of her pain medicine (Tramadol) Patient said her husband passed away 2 weeks ago and she is in a lot of pain.  Patient asked for a call back as soon as possible.   The number to contact patient is [phone]  Please see previous messages.

## 2022-03-04 NOTE — Telephone Encounter (Signed)
Sent in

## 2022-03-07 NOTE — Telephone Encounter (Signed)
Pt called and states she would like to be worked into [place] [phone]

## 2022-03-13 ENCOUNTER — Telehealth: Payer: Self-pay | Admitting: Specialist

## 2022-03-13 NOTE — Telephone Encounter (Signed)
Pt called requesting a call back from Dr. Louanne Skye or Alyse Low about her pain she is having. Please call pt at [phone].

## 2022-03-14 NOTE — Telephone Encounter (Signed)
Attempted to contact patient to ask any questions or concerns however there was no answer and no voicemail.

## 2022-03-19 ENCOUNTER — Ambulatory Visit: Payer: Medicare Other | Admitting: Diagnostic Neuroimaging

## 2022-03-20 ENCOUNTER — Ambulatory Visit (INDEPENDENT_AMBULATORY_CARE_PROVIDER_SITE_OTHER): Payer: Medicare Other | Admitting: Surgery

## 2022-03-20 ENCOUNTER — Telehealth: Payer: Self-pay | Admitting: Surgery

## 2022-03-20 ENCOUNTER — Encounter: Payer: Self-pay | Admitting: Surgery

## 2022-03-20 VITALS — BP 140/75 | HR 85 | Ht 67.5 in | Wt 156.0 lb

## 2022-03-20 DIAGNOSIS — Z981 Arthrodesis status: Secondary | ICD-10-CM

## 2022-03-20 DIAGNOSIS — M5416 Radiculopathy, lumbar region: Secondary | ICD-10-CM | POA: Diagnosis not present

## 2022-03-20 DIAGNOSIS — Z96651 Presence of right artificial knee joint: Secondary | ICD-10-CM | POA: Diagnosis not present

## 2022-03-20 NOTE — Telephone Encounter (Signed)
Pt called stating that she seen PA Ricard Dillon and asking for a different pain med. She states tramadol is not working. Please call pt about this matter at [phone].

## 2022-03-21 NOTE — Telephone Encounter (Signed)
Printed and gave to Dr. Otelia Sergeant to advise

## 2022-03-22 ENCOUNTER — Telehealth: Payer: Self-pay | Admitting: Surgery

## 2022-03-26 ENCOUNTER — Ambulatory Visit: Payer: Medicare Other | Admitting: Physical Therapy

## 2022-03-26 ENCOUNTER — Telehealth: Payer: Self-pay | Admitting: Specialist

## 2022-03-26 NOTE — Telephone Encounter (Signed)
Patient called asked if Dr. Otelia Sergeant will write a note for the post office stating she can not walk to get her mail at the mailbox from the street. Patient asked if the note could read she need her mail delivered to her mailbox at her door. The number to contact patient is  [phone]

## 2022-03-27 ENCOUNTER — Encounter: Payer: Self-pay | Admitting: Radiology

## 2022-03-27 NOTE — Telephone Encounter (Signed)
I wrote the note and called her and lmom for patient to call me back and let me know if she wants me to mail it to her or if she wants to have someone pick it up for her.--I advised that she could leae message with the front desk to let me know.

## 2022-03-28 ENCOUNTER — Telehealth: Payer: Self-pay | Admitting: Orthopaedic Surgery

## 2022-03-28 ENCOUNTER — Other Ambulatory Visit: Payer: Self-pay | Admitting: Neurology

## 2022-03-28 NOTE — Telephone Encounter (Signed)
Tylenol #3 is the strongest I can send in if she wants that.  Thanks.

## 2022-03-28 NOTE — Telephone Encounter (Signed)
Pt called requesting pain medication. Pt tramadol is not working. Pt is asking for a call back from Dr. Erlinda Hong or Beverely Risen. Pt is asking please send meds to pharmacy on file. Pt phone number is [phone].

## 2022-03-29 ENCOUNTER — Telehealth: Payer: Self-pay

## 2022-03-29 NOTE — Telephone Encounter (Signed)
Tried to call patient twice. First time she answered and yelled into phone saying "phone doesn't work" and hung up.  Immediately tried patient again, she answered and laid phone down. I tried talking, but no response.   Closing message.

## 2022-03-29 NOTE — Telephone Encounter (Signed)
She can come in wed afternoon

## 2022-03-29 NOTE — Telephone Encounter (Signed)
Pt lvm on triage phone. States she is having a lot of pain in her knee and wants to be worked in sooner than 7/11. Please advise

## 2022-04-01 NOTE — Telephone Encounter (Signed)
I have made multiple attempts to contact patient at number on file. She hung up on first attempt, and then would not answer on the following two attempts.

## 2022-04-03 ENCOUNTER — Other Ambulatory Visit: Payer: Self-pay | Admitting: Physician Assistant

## 2022-04-03 NOTE — Telephone Encounter (Signed)
Patient would like for [place] to mail the letter to the post office for her said she did not get any message on VM about it mailing address [address] [place], [place], [place] [address] and faxed to Fax: [phone]

## 2022-04-03 NOTE — Telephone Encounter (Signed)
Note mailed and faxed as requested by the patient

## 2022-04-04 ENCOUNTER — Telehealth: Payer: Self-pay | Admitting: Specialist

## 2022-04-04 ENCOUNTER — Ambulatory Visit: Payer: Medicare Other | Admitting: Specialist

## 2022-04-04 NOTE — Telephone Encounter (Signed)
Pt called and is wondering if she can get a new prescription for a new walker?

## 2022-04-05 ENCOUNTER — Other Ambulatory Visit: Payer: Self-pay | Admitting: Specialist

## 2022-04-05 DIAGNOSIS — M5416 Radiculopathy, lumbar region: Secondary | ICD-10-CM

## 2022-04-08 ENCOUNTER — Ambulatory Visit: Payer: Medicare Other | Admitting: Orthopedic Surgery

## 2022-04-09 ENCOUNTER — Ambulatory Visit (INDEPENDENT_AMBULATORY_CARE_PROVIDER_SITE_OTHER): Payer: Medicare Other

## 2022-04-09 ENCOUNTER — Ambulatory Visit (INDEPENDENT_AMBULATORY_CARE_PROVIDER_SITE_OTHER): Payer: Medicare Other | Admitting: Orthopaedic Surgery

## 2022-04-09 DIAGNOSIS — Z96651 Presence of right artificial knee joint: Secondary | ICD-10-CM | POA: Diagnosis not present

## 2022-04-09 NOTE — Progress Notes (Signed)
Office Visit Note   Patient: Kristin Gilmore           Date of Birth: Jul 30, 1951           MRN: [id number] Visit Date: 04/09/2022              Requested by: Aletha Halim., PA-C [address],  [place] [address] PCP: Aletha Halim., PA-C   Assessment & Plan: Visit Diagnoses:  1. Status post total right knee replacement     Plan: X-rays demonstrate subacute patella fracture.  The components are stable.  She has good function and extension strength to the knee.  I think the main thing that this limiting her is the lack of flexion which is due to her going to PT inconsistently in the postoperative period.  Therefore we will place another order to go to physical therapy.  Dental prophylaxis reinforced.  Follow-Up Instructions: No follow-ups on file.   Orders:  Orders Placed This Encounter  Procedures   XR KNEE 3 VIEW RIGHT   No orders of the defined types were placed in this encounter.     Procedures: No procedures performed   Clinical Data: No additional findings.   Subjective: Chief Complaint  Patient presents with   Right Knee - Pain    HPI  Patient is status post right total knee replacement.  Reports occasional pain and stiffness discomfort with increased activity.  Recently lost her husband to heart attack.  Denies any recent injuries.  Had a fall onto her knee several months ago.  Review of Systems  Constitutional: Negative.   HENT: Negative.    Eyes: Negative.   Respiratory: Negative.    Cardiovascular: Negative.   Endocrine: Negative.   Musculoskeletal: Negative.   Neurological: Negative.   Hematological: Negative.   Psychiatric/Behavioral: Negative.    All other systems reviewed and are negative.    Objective: Vital Signs: There were no vitals taken for this visit.  Physical Exam Vitals and nursing note reviewed.  Constitutional:      Appearance: She is well-developed.  Pulmonary:     Effort: Pulmonary effort is  normal.  Skin:    General: Skin is warm.     Capillary Refill: Capillary refill takes less than 2 seconds.  Neurological:     Mental Status: She is alert and oriented to person, place, and time.  Psychiatric:        Behavior: Behavior normal.        Thought Content: Thought content normal.        Judgment: Judgment normal.     Ortho Exam Examination right knee shows a fully healed surgical scar.  No joint effusion.  No evidence of infection.  Stable to varus valgus stress.  Range of motion approximately 15 degrees to 115 degrees.  Flexion contracture can be improved to about 8 to 10 degrees.  No tenderness to palpation to the patella.  Specialty Comments:  PER Dr. Fabio Bering patient needs to be seen for her RIGHT Knee- she should be following up with Dr. Erlinda Hong   Imaging: XR KNEE 3 VIEW RIGHT  Result Date: 04/09/2022 Fracture to the lateral portion of the patella.  Component looks stable.  Fracture appears to be chronic.    PMFS History: Patient Active Problem List   Diagnosis Date Noted   Other spondylosis with radiculopathy, lumbar region 10/30/2021   Other secondary scoliosis, lumbar region 10/30/2021   Spondylolisthesis, lumbar region 10/30/2021   Spondylolisthesis of lumbosacral region  10/30/2021   Status post total right knee replacement 06/26/2021   Primary osteoarthritis of right knee 06/25/2021   Parkinson's disease ([facility]) 01/24/2021   Trochanteric bursitis, left hip 01/18/2020   Lumbar spinal stenosis 12/19/2017   Pain in joint, ankle and foot 05/02/2016   Chronic venous insufficiency 05/02/2016   Peripheral edema 11/01/2015   Intractable chronic migraine without aura 11/02/2014   Tremor 04/13/2014   Migraine without aura 04/13/2014   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   Past Medical History:  Diagnosis Date   Arthritis    Depression    Dyslipidemia    Hypertension    Migraine    Migraine without aura, without mention of intractable migraine without  mention of status migrainosus 04/13/2014   Parkinson's disease ([facility]) 01/24/2021   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   Pneumonia    Pre-diabetes    "i was told i was pre-diabetic a while ago"   Tremor 04/13/2014    Family History  Problem Relation Age of Onset   Cancer Father        stomach cancer   Migraines Mother     Past Surgical History:  Procedure Laterality Date   CHOLECYSTECTOMY     COLONOSCOPY W/ POLYPECTOMY     LUMBAR LAMINECTOMY  07/2020   3 level decompression - [place], [place]   LUMBAR LAMINECTOMY/DECOMPRESSION MICRODISCECTOMY N/A 12/19/2017   Procedure: LAMINECTOMY LUMBAR TWO- LUMBAR THREE, LUMBAR THREE- LUMBAR FOUR, LUMBAR FOUR- LUMBAR FIVE ;  Surgeon: Consuella Lose, MD;  Location: [facility];  Service: Neurosurgery;  Laterality: N/A;   NASAL SINUS SURGERY     TONSILLECTOMY     TOTAL KNEE ARTHROPLASTY Right 06/26/2021   Procedure: RIGHT TOTAL KNEE ARTHROPLASTY;  Surgeon: Leandrew Koyanagi, MD;  Location: [facility];  Service: Orthopedics;  Laterality: Right;   WISDOM TOOTH EXTRACTION     Social History   Occupational History   Occupation: Retired  Tobacco Use   Smoking status: Never   Smokeless tobacco: Never  Vaping Use   Vaping Use: Never used  Substance and Sexual Activity   Alcohol use: Yes    Comment: occasional glass of wine   Drug use: No   Sexual activity: Not on file

## 2022-04-10 ENCOUNTER — Ambulatory Visit: Payer: Medicare Other

## 2022-04-11 NOTE — Telephone Encounter (Signed)
Can you call patient and let her know that letter is ready for pickup at the front desk please.?

## 2022-04-11 NOTE — Telephone Encounter (Signed)
Patient would like a refill on Tramadol Patient stated per Erlinda Hong she go through [facility] for pain medication

## 2022-04-12 ENCOUNTER — Other Ambulatory Visit: Payer: Self-pay | Admitting: Specialist

## 2022-04-12 ENCOUNTER — Other Ambulatory Visit: Payer: Self-pay | Admitting: Surgery

## 2022-04-12 MED ORDER — TRAMADOL HCL 50 MG PO TABS: 50.0000 mg | ORAL_TABLET | Freq: Two times a day (BID) | ORAL | 0 refills | Status: DC | PRN

## 2022-04-12 NOTE — Telephone Encounter (Signed)
Pt called requesting tramadol per Dr. Erlinda Hong pain medication need to be prescribed from Dr. Louanne Skye. Please send to pharmacy [facility]. Pt phone number is [phone].

## 2022-04-14 ENCOUNTER — Other Ambulatory Visit: Payer: Self-pay | Admitting: Physician Assistant

## 2022-04-14 MED ORDER — HYDROCODONE-ACETAMINOPHEN 5-325 MG PO TABS: 1.0000 | ORAL_TABLET | Freq: Four times a day (QID) | ORAL | 0 refills | Status: DC | PRN

## 2022-04-15 ENCOUNTER — Telehealth: Payer: Self-pay

## 2022-04-15 MED ORDER — TRAMADOL HCL 50 MG PO TABS: 50.0000 mg | ORAL_TABLET | Freq: Two times a day (BID) | ORAL | 0 refills | Status: DC | PRN

## 2022-04-15 NOTE — Telephone Encounter (Signed)
Patient states she needs a refill on Blood pressure medication doctor states she will not refill it and she needs someone to approve the refill at the pharmacy, I informed patient we may not be able to order Amlodipine for her being we did not prescribe it, she stated she understood

## 2022-04-15 NOTE — Telephone Encounter (Signed)
I called and advised her BP meds from her PCP, she states that she has been discharged from her. She is going to an Urgent Care for her refill on the BP meds. She was also complaining of being in a lot of pain.

## 2022-04-15 NOTE — Telephone Encounter (Signed)
Patient called stating that her right knee is in severe pain and that she can hardly walk.  Stated that she did get the Rx for Hydrocodone, but now her stomach is upset and she has diarrhea.  Cb# [phone].  Please advise.  Thank you.

## 2022-04-15 NOTE — Telephone Encounter (Signed)
I'm not sure what to do about her complaints because she's had the same complaints for the last year and I've done a thorough evaluation and can't find anything.  I'm not sure there's anything else I can do.

## 2022-04-15 NOTE — Telephone Encounter (Signed)
Tried to call patient. She picked up the phone and hung it up.

## 2022-04-18 ENCOUNTER — Telehealth: Payer: Self-pay | Admitting: Specialist

## 2022-04-18 NOTE — Telephone Encounter (Signed)
Pt called stating she received a message about a letter she need for the post office. Pt is asking for letter to be mailed to her. Pt phone number is [phone].

## 2022-04-18 NOTE — Telephone Encounter (Signed)
Letter mailed

## 2022-04-19 ENCOUNTER — Telehealth: Payer: Self-pay

## 2022-04-19 ENCOUNTER — Other Ambulatory Visit: Payer: Self-pay | Admitting: Specialist

## 2022-04-19 ENCOUNTER — Telehealth: Payer: Self-pay | Admitting: Physician Assistant

## 2022-04-19 NOTE — Telephone Encounter (Signed)
Patient calling concerning Rx refill for Hydrocodone.  Advised her of the message per Bevely Palmer, but patient stated that she needs this Rx refill before the weekend.  Please advise.

## 2022-04-19 NOTE — Telephone Encounter (Signed)
Pt called requesting PA Audrea Muscat Persons. Pt states Dr Louanne Skye is not in today and PA Audrea Muscat called in her hydrocodone. She states she only has 3 left and won't make it thru the weekend with just 3. She states she is in sever pains and please send in her hydrocodone to pharmacy on file. Please call pt when sent in she is requesting. Phone number is [phone]

## 2022-04-19 NOTE — Telephone Encounter (Signed)
Please advise 

## 2022-04-19 NOTE — Telephone Encounter (Signed)
Patient called needing Rx refilled Hydrocodone. The number to contact patient is [phone]

## 2022-04-20 MED ORDER — HYDROCODONE-ACETAMINOPHEN 5-325 MG PO TABS: 1.0000 | ORAL_TABLET | Freq: Four times a day (QID) | ORAL | 0 refills | Status: DC | PRN

## 2022-04-22 ENCOUNTER — Encounter (HOSPITAL_BASED_OUTPATIENT_CLINIC_OR_DEPARTMENT_OTHER): Payer: Self-pay

## 2022-04-22 ENCOUNTER — Telehealth: Payer: Self-pay | Admitting: Specialist

## 2022-04-22 ENCOUNTER — Emergency Department (HOSPITAL_BASED_OUTPATIENT_CLINIC_OR_DEPARTMENT_OTHER): Payer: Medicare Other

## 2022-04-22 ENCOUNTER — Other Ambulatory Visit: Payer: Self-pay | Admitting: Physician Assistant

## 2022-04-22 ENCOUNTER — Other Ambulatory Visit: Payer: Self-pay

## 2022-04-22 ENCOUNTER — Emergency Department (HOSPITAL_BASED_OUTPATIENT_CLINIC_OR_DEPARTMENT_OTHER)
Admission: EM | Admit: 2022-04-22 | Discharge: 2022-04-23 | Disposition: A | Discharge status: Home / Self Care | Payer: Medicare Other | Attending: Emergency Medicine | Admitting: Emergency Medicine

## 2022-04-22 DIAGNOSIS — Z79899 Other long term (current) drug therapy: Secondary | ICD-10-CM | POA: Diagnosis not present

## 2022-04-22 DIAGNOSIS — Z7982 Long term (current) use of aspirin: Secondary | ICD-10-CM | POA: Diagnosis not present

## 2022-04-22 DIAGNOSIS — M5416 Radiculopathy, lumbar region: Secondary | ICD-10-CM | POA: Insufficient documentation

## 2022-04-22 DIAGNOSIS — M545 Low back pain, unspecified: Secondary | ICD-10-CM | POA: Diagnosis present

## 2022-04-22 MED ORDER — ONDANSETRON HCL 4 MG/2ML IJ SOLN
4.0000 mg | Freq: Once | INTRAMUSCULAR | Status: AC
  Administered 2022-04-22: 4 mg via INTRAVENOUS
  Filled 2022-04-22: qty 2

## 2022-04-22 MED ORDER — LORAZEPAM 2 MG/ML IJ SOLN
1.0000 mg | Freq: Once | INTRAMUSCULAR | Status: AC
  Administered 2022-04-22: 1 mg via INTRAVENOUS
  Filled 2022-04-22: qty 1

## 2022-04-22 MED ORDER — HYDROMORPHONE HCL 1 MG/ML IJ SOLN
1.0000 mg | Freq: Once | INTRAMUSCULAR | Status: AC
  Administered 2022-04-22: 1 mg via INTRAVENOUS
  Filled 2022-04-22: qty 1

## 2022-04-22 MED ORDER — ACETAMINOPHEN 325 MG PO TABS
650.0000 mg | ORAL_TABLET | Freq: Once | ORAL | Status: AC
  Administered 2022-04-22: 650 mg via ORAL
  Filled 2022-04-22: qty 2

## 2022-04-22 MED ORDER — FENTANYL CITRATE PF 50 MCG/ML IJ SOSY
50.0000 ug | PREFILLED_SYRINGE | Freq: Once | INTRAMUSCULAR | Status: AC
  Administered 2022-04-22: 50 ug via INTRAMUSCULAR
  Filled 2022-04-22: qty 1

## 2022-04-22 MED ORDER — HYDROCODONE-ACETAMINOPHEN 10-325 MG PO TABS
1.0000 | ORAL_TABLET | Freq: Once | ORAL | Status: DC
Start: 2022-04-22 — End: 2022-04-22

## 2022-04-22 MED ORDER — HYDROCODONE-ACETAMINOPHEN 5-325 MG PO TABS: 1.0000 | ORAL_TABLET | Freq: Four times a day (QID) | ORAL | 0 refills | Status: DC | PRN

## 2022-04-22 MED ORDER — LIDOCAINE 5 % EX PTCH
1.0000 | MEDICATED_PATCH | CUTANEOUS | Status: DC
  Administered 2022-04-22: 1 via TRANSDERMAL
  Filled 2022-04-22: qty 1

## 2022-04-22 NOTE — ED Notes (Signed)
Patient transported to CT 

## 2022-04-22 NOTE — Telephone Encounter (Signed)
Pt called requesting medical advice for back pains until her appt on Thursday. Pt states ice is not working. Please call pt with medical advice for back pain. Pt phone number is [phone].

## 2022-04-22 NOTE — Telephone Encounter (Signed)
Looks like [facility]ka has sent this in

## 2022-04-22 NOTE — ED Triage Notes (Signed)
Patient BIB [facility] from Home.  Endorses Lower Back Pain (More Severe Right Lower Back) that began this AM. History of Sciatica and Patient endorses Pain is Similar.   No Acute Trauma or Injury.   Uncomfortable during Triage. A&Ox4. GCS 15. BIB [facility].

## 2022-04-22 NOTE — Telephone Encounter (Signed)
Dr. Louanne Skye sent her in some pain meds to her pharmacy-- I called and lmom for her to advise her this was done.

## 2022-04-22 NOTE — ED Provider Notes (Signed)
[facility]ERGENC[facility] DEPT Provider Note   CSN: [id number] Arrival date & time: 04/22/22  1708     History  Chief Complaint  Patient presents with   Back Pain    Kristin Gilmore is a 71 y.o. female.  71 year old female with past medical history of multiple back surgeries and right side sciatica as well as right knee replacement presents with complaint of pain in her right buttock radiating down her right leg.  Patient has been going to see her orthopedic for same however states the muscle relaxant and hydrocodone she has been prescribed is not helping at all with her pain.  She denies recent falls or injuries, fevers, abdominal pain, groin numbness, loss of bowel or bladder control.  Patient is scheduled to see her orthopedist later this week and for a CT of her lumbar spine tomorrow.  Patient reports recent passing of her husband and difficulty completing all tasks that are now on her plate.  Patient acknowledges she would benefit from physical therapy however has not been able to do this as of yet.       Home Medications Prior to Admission medications   Medication Sig Start Date End Date Taking? Authorizing Provider  Allantoin 0.5 % GEL Apply 1 application topically 2 (two) times daily.    [provider]  ALPRAZolam Duanne Moron) 0.25 MG tablet Take 1 tablet (0.25 mg total) by mouth 2 (two) times daily as needed for anxiety. 11/02/21   Jessy Oto, MD  amitriptyline (ELAVIL) 10 MG tablet Take 3 tablets (30 mg total) by mouth at bedtime. 09/04/21   Melvenia Beam, MD  amLODipine (NORVASC) 5 MG tablet Take 5 mg by mouth 2 (two) times daily. 07/27/18   [provider]  aspirin EC 81 MG tablet Take 1 tablet (81 mg total) by mouth 2 (two) times daily. Patient taking differently: Take 81 mg by mouth daily with breakfast. 06/26/21   Aundra Dubin, PA-C  Biotin 5000 MCG CAPS Take 5,000 mcg by mouth every morning.    [provider]  bisacodyl  (DULCOLAX) 5 MG EC tablet Take 5-15 mg by mouth daily as needed (constipation).    [provider]  Calcium Citrate-Vitamin D (CALCIUM CITRATE + D PO) Take 1-2 capsules by mouth See admin instructions. Calcium 200 mg, vitamin D3 2.5 mcg - 100 units - take 1 tablet by mouth with breakfast and 2 tablets with supper    [provider]  carvedilol (COREG) 25 MG tablet Take 12.5 mg by mouth 2 (two) times daily with a meal. 07/14/19   [provider]  Cholecalciferol (VITAMIN D3) 2000 UNITS TABS Take 4,000 Units by mouth daily with supper.    [provider]  Coenzyme Q10 (COQ10) 100 MG CAPS Take 100 mg by mouth daily with supper.    [provider]  docusate sodium (COLACE) 100 MG capsule Take 300 mg by mouth at bedtime as needed (constipation).    [provider]  ferrous sulfate 325 (65 FE) MG EC tablet Take 325 mg by mouth See admin instructions. Take one tablet (325) mg) by mouth every afternoon on an empty stomach 10/05/19   [provider]  furosemide (LASIX) 20 MG tablet Take 1 tablet (20 mg total) by mouth daily. 11/29/21   Prosperi, Christian H, PA-C  glimepiride (AMARYL) 2 MG tablet Take 1 mg by mouth daily before breakfast. 10/19/19   [provider]  Glucosamine-Chondroitin 500-400 MG CAPS Take 1-2 capsules by  mouth See admin instructions. Take one capsule by mouth every morning, and two capsules at night    [provider]  HYDROcodone-acetaminophen (NORCO) 5-325 MG tablet Take 1 tablet by mouth every 6 (six) hours as needed. 04/22/22   Jessy Oto, MD  ibuprofen (ADVIL) 200 MG tablet Take 400 mg by mouth daily as needed for headache.    [provider]  Lactobacillus (LACTINEX PO) Take 1 tablet by mouth daily.    [provider]  loperamide (IMODIUM) 2 MG capsule Take 2 mg by mouth 4 (four) times daily as needed for diarrhea or loose stools.    [provider]  MAGNESIUM CITRATE PO Take 150  mg by mouth at bedtime.    [provider]  Magnesium Oxide 500 MG TABS Take 500 mg by mouth at bedtime.    [provider]  Menthol, Topical Analgesic, (BIOFREEZE) 4 % GEL Apply 1 application topically 4 (four) times daily as needed (pain).    [provider]  methocarbamol (ROBAXIN) 500 MG tablet Take 1 tablet (500 mg total) by mouth every 8 (eight) hours as needed for muscle spasms. 11/02/21   Jessy Oto, MD  methylPREDNISolone (MEDROL DOSEPAK) 4 MG TBPK tablet Take as directed 02/18/22   Aundra Dubin, PA-C  mupirocin ointment (BACTROBAN) 2 % Place 1 application into the nose 3 (three) times daily. 06/16/21   [provider]  Omega-3 Fatty Acids (FISH OIL) 1000 MG CAPS Take 1,000 mg by mouth 2 (two) times daily.    [provider]  OVER THE COUNTER MEDICATION Take 1 capsule by mouth 2 (two) times daily. [place] brain blood vessel relaxation (butterburr root extract)    [provider]  OVER THE COUNTER MEDICATION Apply 1 application topically daily. Leg & back pain relief cream    [provider]  oxyCODONE (OXYCONTIN) 20 mg 12 hr tablet Take 1 tablet (20 mg total) by mouth every 12 (twelve) hours. 11/02/21   Jessy Oto, MD  pantoprazole (PROTONIX) 40 MG tablet Take 40 mg by mouth 2 (two) times daily.    [provider]  Probiotic Product (PROBIOTIC PO) Take 1 capsule by mouth daily with breakfast. SUPREMA DOPHILUS 5 BILLION CFU    [provider]  QUEtiapine (SEROQUEL) 300 MG tablet TAKE 0.5 TABLETS (150 MG TOTAL) BY MOUTH AT BEDTIME. Patient taking differently: Take 150 mg by mouth at bedtime. 09/12/21   Cottle, Billey Co., MD  Red Yeast Rice 600 MG CAPS Take 1,200 mg by mouth 2 (two) times daily.    [provider]  Simethicone 180 MG CAPS Take 180 mg by mouth daily as needed (gas/bloating).    [provider]  sodium chloride (OCEAN) 0.65 % SOLN nasal spray Place 1 spray into  both nostrils 3 (three) times daily.    [provider]  sulfamethoxazole-trimethoprim (BACTRIM DS) 800-160 MG tablet Take 1 tablet by mouth 2 (two) times daily. 02/14/22   Leandrew Koyanagi, MD  SUMAtriptan (IMITREX) 100 MG tablet Take 1 tablet (100 mg total) by mouth 2 (two) times daily as needed for up to 28 days for migraine. May repeat in 2 hours if headache persists or recurs. 02/28/22 03/28/22  Garvin Fila, MD  telmisartan (MICARDIS) 80 MG tablet Take 80 mg by mouth at bedtime.    [provider]  tolterodine (DETROL LA) 4 MG 24 hr capsule Take 4 mg by mouth daily with breakfast. 02/20/21  [provider]  trihexyphenidyl (ARTANE) 2 MG tablet Take 1 tablet (2 mg total) by mouth 3 (three) times daily with meals. 01/31/22   Penumalli, Earlean Polka, MD  Turmeric 500 MG CAPS Take 500 mg by mouth 2 (two) times daily with a meal.    [provider]  venlafaxine XR (EFFEXOR-XR) 75 MG 24 hr capsule TAKE 1 CAPSULE BY MOUTH DAILY WITH BREAKFAST. Patient taking differently: 75 mg daily with breakfast. 08/02/21   Kathrynn Ducking, MD      Allergies    Levofloxacin, Amoxicillin, Clarithromycin, and Atropine    Review of Systems   Review of Systems Negative except as per HPI Physical Exam Updated Vital Signs BP 112/66 (BP Location: Right Arm)   Pulse 87   Temp 98.2 F (36.8 C) (Oral)   Resp 18   Ht 5' 7.5" (1.715 m)   Wt 70.8 kg   SpO2 94%   BMI 24.09 kg/m  Physical Exam Vitals and nursing note reviewed.  Constitutional:      General: She is not in acute distress.    Appearance: She is well-developed. She is not diaphoretic.  HENT:     Head: Normocephalic and atraumatic.  Cardiovascular:     Pulses: Normal pulses.  Pulmonary:     Effort: Pulmonary effort is normal.  Abdominal:     Palpations: Abdomen is soft.     Tenderness: There is no abdominal tenderness.  Musculoskeletal:        General: Tenderness present. No swelling or deformity.     Thoracic  back: No tenderness or bony tenderness.     Lumbar back: Tenderness and bony tenderness present. Decreased range of motion.       Back:     Comments: Tenderness to lumbar spine midline as well as right paraspinous and right SI.  Leg strength symmetric, sensation intact, reflexes symmetric.  DP pulses present.  Skin:    General: Skin is warm and dry.     Findings: No bruising, erythema or rash.  Neurological:     Mental Status: She is alert and oriented to person, place, and time.     Sensory: No sensory deficit.     Motor: No weakness.  Psychiatric:        Behavior: Behavior normal.     ED Results / Procedures / Treatments   Labs (all labs ordered are listed, but only abnormal results are displayed) Labs Reviewed - No data to display  EKG None  Radiology No results found.  Procedures Procedures    Medications Ordered in ED Medications  lidocaine (LIDODERM) 5 % 1 patch (1 patch Transdermal Patch Applied 04/22/22 1920)  HYDROcodone-acetaminophen (NORCO) 10-325 MG per tablet 1 tablet (has no administration in time range)  acetaminophen (TYLENOL) tablet 650 mg (has no administration in time range)  fentaNYL (SUBLIMAZE) injection 50 mcg (50 mcg Intramuscular Given 04/22/22 1924)  LORazepam (ATIVAN) injection 1 mg (1 mg Intravenous Given 04/22/22 2022)  HYDROmorphone (DILAUDID) injection 1 mg (1 mg Intravenous Given 04/22/22 2024)  ondansetron (ZOFRAN) injection 4 mg (4 mg Intravenous Given 04/22/22 2022)    ED Course/ Medical Decision Making/ A&P                           Medical Decision Making Amount and/or Complexity of Data Reviewed Radiology: ordered.  Risk Prescription drug management.   71 year old female with complaint of severe right lower back pain radiating down her right leg, recently  seen by her orthopedist and diagnosed with sciatica.  Patient is being treated with muscle relaxant and hydrocodone which she is taking with ibuprofen and a scheduled regimen  without control of her pain.  She is not doing any physical therapy currently.  Patient is scheduled for a CT lumbar spine without contrast tomorrow and follow-up appoint with her orthopedist on Thursday. Patient is found to have diffuse midline to right low back pain to right SI joint.  She does have good strength in her lower extremities with symmetric reflexes. Patient was given fentanyl for her pain without any relief.  Plan was for patient to get her CT done tonight as she is in such significant pain I do not think would be feasible for her to obtain this outpatient tomorrow.  Patient was given Dilaudid and Ativan and states that she "do(es) not feel like I could go to the opera right now but significantly better." Patient request her scheduled hydrocodone and Tylenol which has been ordered. Plan is for patient to likely go home to follow-up with her orthopedist, she is pending her CT read at this time. Care signed out pending CT report and final dispo plan.         Final Clinical Impression(s) / ED Diagnoses Final diagnoses:  Lumbar radiculopathy, right    Rx / DC Orders ED Discharge Orders     None         Tacy Learn, PA-C 04/22/22 2156    Malvin Johns, MD 04/22/22 2314

## 2022-04-22 NOTE — Discharge Instructions (Addendum)
Follow-up with your orthopedic team as scheduled. Continue with your pain medications as scheduled. Return to ER for worsening or concerning symptoms. Regarding your pain medication regimen, you may take your other medications but do not take the Tylenol tonight as you got a dose of Tylenol in the emergency department.  You can restart it tomorrow.

## 2022-04-22 NOTE — ED Notes (Signed)
Multiple RN's have attempted to reassure patient that a provider will see her soon. She continues to scream out "Please help me" at the top of her lungs.

## 2022-04-23 ENCOUNTER — Telehealth: Payer: Self-pay | Admitting: Specialist

## 2022-04-23 ENCOUNTER — Other Ambulatory Visit: Payer: Medicare Other

## 2022-04-23 DIAGNOSIS — M5416 Radiculopathy, lumbar region: Secondary | ICD-10-CM | POA: Diagnosis not present

## 2022-04-23 MED ORDER — DICLOFENAC SODIUM 1 % EX GEL
4.0000 g | Freq: Four times a day (QID) | CUTANEOUS | Status: DC
  Administered 2022-04-23: 4 g via TOPICAL
  Filled 2022-04-23: qty 100

## 2022-04-23 MED ORDER — OXYCODONE HCL 5 MG PO TABS
10.0000 mg | ORAL_TABLET | Freq: Once | ORAL | Status: AC
  Administered 2022-04-23: 10 mg via ORAL
  Filled 2022-04-23: qty 2

## 2022-04-23 NOTE — ED Notes (Addendum)
Called [facility] for update on transportation, stated the truck is dropping someone off and will come for patient after. No ETA.

## 2022-04-23 NOTE — Telephone Encounter (Signed)
I called and lmom that we have sent in pain meds to get her thru till we can seen her in the office.

## 2022-04-23 NOTE — ED Notes (Signed)
Patient picked up by [facility] to be transported home.

## 2022-04-23 NOTE — ED Notes (Signed)
Called [facility] at 1208pm, stated one person ahead of patient

## 2022-04-23 NOTE — Telephone Encounter (Signed)
Patient called advised she went to the hospital yesterday and got home at 5:00pm.  Patient said her left leg is hurting really bad and asked if a nurse can call her as soon as possible.  The number to contact patient is [phone]

## 2022-04-24 ENCOUNTER — Telehealth: Payer: Self-pay | Admitting: Specialist

## 2022-04-24 NOTE — Telephone Encounter (Signed)
Pt called requesting a call back as soon as possible. Pt states she is in pain. Please call her at [phone]. She is in severe pain.

## 2022-04-24 NOTE — Telephone Encounter (Signed)
I called and spoke with patient and advised that until she is seen in the office tomorrow that she is doing all that we can tell her to do. However I advised that she should keep the appointment for tomorrow to review her CT scan. She states that she understands

## 2022-04-25 ENCOUNTER — Ambulatory Visit: Payer: Self-pay | Admitting: Physical Therapy

## 2022-04-25 ENCOUNTER — Telehealth: Payer: Self-pay | Admitting: Specialist

## 2022-04-25 ENCOUNTER — Ambulatory Visit (INDEPENDENT_AMBULATORY_CARE_PROVIDER_SITE_OTHER): Payer: Medicare Other | Admitting: Specialist

## 2022-04-25 ENCOUNTER — Encounter: Payer: Self-pay | Admitting: Specialist

## 2022-04-25 VITALS — Ht 67.5 in | Wt 156.0 lb

## 2022-04-25 DIAGNOSIS — M4726 Other spondylosis with radiculopathy, lumbar region: Secondary | ICD-10-CM

## 2022-04-25 DIAGNOSIS — M7061 Trochanteric bursitis, right hip: Secondary | ICD-10-CM

## 2022-04-25 DIAGNOSIS — M5416 Radiculopathy, lumbar region: Secondary | ICD-10-CM

## 2022-04-25 DIAGNOSIS — Z96651 Presence of right artificial knee joint: Secondary | ICD-10-CM

## 2022-04-25 DIAGNOSIS — M4316 Spondylolisthesis, lumbar region: Secondary | ICD-10-CM

## 2022-04-25 DIAGNOSIS — G20 Parkinson's disease: Secondary | ICD-10-CM | POA: Diagnosis not present

## 2022-04-25 NOTE — Telephone Encounter (Signed)
I called and advised that she needs to come as she is doing everything at home that we have told her and it is not helping, and we can not treat her over the phone, I advised that she has called every single day that she is in pain. And that if she wants [place] to help her that she needs to come in to her appt today, she states that her aid had not got there but that when she does that they will come in.  Patient did show up for her appointment

## 2022-04-25 NOTE — Progress Notes (Signed)
Office Visit Note   Patient: Kristin Gilmore           Date of Birth: 05/16/1951           MRN: [id number] Visit Date: 04/25/2022              Requested by: Aletha Halim., PA-C [address],  [place] [address] PCP: Aletha Halim., PA-C   Assessment & Plan: Visit Diagnoses:  1. Spondylolisthesis, lumbar region   2. Other spondylosis with radiculopathy, lumbar region   3. Parkinson disease ([place])   4. Radiculopathy, lumbar region   5. Status post total right knee replacement     Plan: Avoid bending, stooping and avoid lifting weights greater than 10 lbs. Avoid prolong standing and walking. Avoid frequent bending and stooping  No lifting greater than 10 lbs. May use ice or moist heat for pain. Weight loss is of benefit. Handicap license is approved. Dr. Romona Curls secretary/Assistant will call to arrange for epidural steroid injection    Follow-Up Instructions: No follow-ups on file.   Orders:  No orders of the defined types were placed in this encounter.  No orders of the defined types were placed in this encounter.     Procedures: No procedures performed   Clinical Data: No additional findings.   Subjective: Chief Complaint  Patient presents with  . Lower Back - Follow-up    CT review    71 year old female with history of L3-4 and L4-5 TLIFs for spondylosis with scoliosis. Pain is into the right lateral thigh. Went to the ER twice with pain. CT scan was done and the study shows right sided L5 foramenal stenosis that is severe. There is some residual posterior disc spurs on the right side at the level of the foramen but the posterior foramen is open and the nerves are well decompressed.   Review of Systems  Constitutional: Negative.   HENT: Negative.    Eyes: Negative.   Respiratory: Negative.    Cardiovascular: Negative.   Gastrointestinal: Negative.   Endocrine: Negative.   Genitourinary: Negative.   Musculoskeletal: Negative.    Skin: Negative.   Allergic/Immunologic: Negative.   Neurological: Negative.   Hematological: Negative.   Psychiatric/Behavioral: Negative.       Objective: Vital Signs: Ht 5' 7.5" (1.715 m)   Wt 156 lb (70.8 kg)   BMI 24.07 kg/m   Physical Exam Constitutional:      Appearance: She is well-developed.  HENT:     Head: Normocephalic and atraumatic.  Eyes:     Pupils: Pupils are equal, round, and reactive to light.  Pulmonary:     Effort: Pulmonary effort is normal.     Breath sounds: Normal breath sounds.  Abdominal:     General: Bowel sounds are normal.     Palpations: Abdomen is soft.  Musculoskeletal:        General: Normal range of motion.     Cervical back: Normal range of motion and neck supple.  Skin:    General: Skin is warm and dry.  Neurological:     Mental Status: She is alert and oriented to person, place, and time.  Psychiatric:        Behavior: Behavior normal.        Thought Content: Thought content normal.        Judgment: Judgment normal.   Right Hip Exam   Tenderness  The patient is experiencing tenderness in the greater trochanter.   Back  Exam   Tenderness  The patient is experiencing tenderness in the lumbar.  Muscle Strength  Right Quadriceps:  5/5  Left Quadriceps:  5/5  Right Hamstrings:  5/5  Left Hamstrings:  5/5   Comments:  Right EHL 3/5 Right hip abductor 4/5     Specialty Comments:  PER Dr. Fabio Bering patient needs to be seen for her RIGHT Knee- she should be following up with Dr. Erlinda Hong   Imaging: No results found.   PMFS History: Patient Active Problem List   Diagnosis Date Noted  . Spondylolisthesis, lumbar region 10/30/2021    Priority: High  . Other spondylosis with radiculopathy, lumbar region 10/30/2021  . Other secondary scoliosis, lumbar region 10/30/2021  . Spondylolisthesis of lumbosacral region 10/30/2021  . Status post total right knee replacement 06/26/2021  . Primary osteoarthritis of right knee  06/25/2021  . Parkinson's disease ([place]) 01/24/2021  . Trochanteric bursitis, left hip 01/18/2020  . Lumbar spinal stenosis 12/19/2017  . Pain in joint, ankle and foot 05/02/2016  . Chronic venous insufficiency 05/02/2016  . Peripheral edema 11/01/2015  . Intractable chronic migraine without aura 11/02/2014  . Tremor 04/13/2014  . Migraine without aura 04/13/2014  . Pituitary microadenoma ([place]) 04/13/2014   Past Medical History:  Diagnosis Date  . Arthritis   . Depression   . Dyslipidemia   . Hypertension   . Migraine   . Migraine without aura, without mention of intractable migraine without mention of status migrainosus 04/13/2014  . Parkinson's disease ([place]) 01/24/2021  . Pituitary microadenoma ([place]) 04/13/2014  . Pneumonia   . Pre-diabetes    "i was told i was pre-diabetic a while ago"  . Tremor 04/13/2014    Family History  Problem Relation Age of Onset  . Cancer Father        stomach cancer  . Migraines Mother     Past Surgical History:  Procedure Laterality Date  . CHOLECYSTECTOMY    . COLONOSCOPY W/ POLYPECTOMY    . LUMBAR LAMINECTOMY  07/2020   3 level decompression - [place], [place]  . LUMBAR LAMINECTOMY/DECOMPRESSION MICRODISCECTOMY N/A 12/19/2017   Procedure: LAMINECTOMY LUMBAR TWO- LUMBAR THREE, LUMBAR THREE- LUMBAR FOUR, LUMBAR FOUR- LUMBAR FIVE ;  Surgeon: Consuella Lose, MD;  Location: [facility];  Service: Neurosurgery;  Laterality: N/A;  . NASAL SINUS SURGERY    . TONSILLECTOMY    . TOTAL KNEE ARTHROPLASTY Right 06/26/2021   Procedure: RIGHT TOTAL KNEE ARTHROPLASTY;  Surgeon: Leandrew Koyanagi, MD;  Location: [facility];  Service: Orthopedics;  Laterality: Right;  . WISDOM TOOTH EXTRACTION     Social History   Occupational History  . Occupation: Retired  Tobacco Use  . Smoking status: Never  . Smokeless tobacco: Never  Vaping Use  . Vaping Use: Never used  Substance and Sexual Activity  . Alcohol use: Yes    Comment: occasional glass of wine  . Drug use: No   . Sexual activity: Not on file

## 2022-04-25 NOTE — Telephone Encounter (Signed)
Patient called needing a call back before she cancels her appointment. Patient said she is already on pain medication and don't know what else Dr. Louanne Skye can do for her. Patient said Dr. Louanne Skye do not do back injections and she could not get an injection today. The number to contact patient is [phone]

## 2022-04-25 NOTE — Patient Instructions (Signed)
Avoid bending, stooping and avoid lifting weights greater than 10 lbs. Avoid prolong standing and walking. Avoid frequent bending and stooping  No lifting greater than 10 lbs. May use ice or moist heat for pain. Weight loss is of benefit. Handicap license is approved. Interventional radiology with [facility] will call to arrange for epidural steroid injection

## 2022-05-01 ENCOUNTER — Other Ambulatory Visit: Payer: Self-pay | Admitting: Specialist

## 2022-05-01 DIAGNOSIS — M5416 Radiculopathy, lumbar region: Secondary | ICD-10-CM

## 2022-05-02 ENCOUNTER — Ambulatory Visit: Payer: Medicare Other | Admitting: Specialist

## 2022-05-02 ENCOUNTER — Other Ambulatory Visit: Payer: Self-pay | Admitting: Specialist

## 2022-05-02 MED ORDER — HYDROCODONE-ACETAMINOPHEN 5-325 MG PO TABS: 1.0000 | ORAL_TABLET | Freq: Four times a day (QID) | ORAL | 0 refills | Status: DC | PRN

## 2022-05-02 NOTE — Telephone Encounter (Signed)
Patient called needing Rx refilled Hydrocodone. The number to contact patient is [phone]

## 2022-05-03 ENCOUNTER — Other Ambulatory Visit: Payer: Self-pay | Admitting: Psychiatry

## 2022-05-03 DIAGNOSIS — F3171 Bipolar disorder, in partial remission, most recent episode hypomanic: Secondary | ICD-10-CM

## 2022-05-04 ENCOUNTER — Other Ambulatory Visit: Payer: Self-pay | Admitting: Diagnostic Neuroimaging

## 2022-05-06 ENCOUNTER — Ambulatory Visit
Admission: RE | Admit: 2022-05-06 | Discharge: 2022-05-06 | Disposition: A | Discharge status: Home / Self Care | Payer: Medicare Other | Source: Ambulatory Visit | Attending: Specialist | Admitting: Specialist

## 2022-05-06 DIAGNOSIS — M5416 Radiculopathy, lumbar region: Secondary | ICD-10-CM

## 2022-05-06 MED ORDER — METHYLPREDNISOLONE ACETATE 40 MG/ML INJ SUSP (RADIOLOG
80.0000 mg | Freq: Once | INTRAMUSCULAR | Status: AC
  Administered 2022-05-06: 80 mg via EPIDURAL

## 2022-05-06 MED ORDER — IOPAMIDOL (ISOVUE-M 200) INJECTION 41%
1.0000 mL | Freq: Once | INTRAMUSCULAR | Status: AC
  Administered 2022-05-06: 1 mL via EPIDURAL

## 2022-05-06 NOTE — Discharge Instructions (Signed)
Post Procedure Spinal Discharge Instruction Sheet ? ?You may resume a regular diet and any medications that you routinely take (including pain medications) unless otherwise noted by MD. ? ?No driving day of procedure. ? ?Light activity throughout the rest of the day.  Do not do any strenuous work, exercise, bending or lifting.  The day following the procedure, you can resume normal physical activity but you should refrain from exercising or physical therapy for at least three days thereafter. ? ?You may apply ice to the injection site, 20 minutes on, 20 minutes off, as needed. Do not apply ice directly to skin.  ? ? ?Common Side Effects: ? ?Headaches- take your usual medications as directed by your physician.  Increase your fluid intake.  Caffeinated beverages may be helpful.  Lie flat in bed until your headache resolves. ? ?Restlessness or inability to sleep- you may have trouble sleeping for the next few days.  Ask your referring physician if you need any medication for sleep. ? ?Facial flushing or redness- should subside within a few days. ? ?Increased pain- a temporary increase in pain a day or two following your procedure is not unusual.  Take your pain medication as prescribed by your referring physician. ? ?Leg cramps ? ?Please contact our office at [phone] for the following symptoms: ?Fever greater than 100 degrees. ?Headaches unresolved with medication after 2-3 days. ?Increased swelling, pain, or redness at injection site. ? ? ?Thank you for visiting [facility] today.  ? ?MAY RESUME ASPIRIN IMMEDIATELY AFTER PROCEDURE!  ?

## 2022-05-07 ENCOUNTER — Other Ambulatory Visit: Payer: Self-pay | Admitting: Orthopaedic Surgery

## 2022-05-07 MED ORDER — HYDROCODONE-ACETAMINOPHEN 5-325 MG PO TABS: 1.0000 | ORAL_TABLET | Freq: Four times a day (QID) | ORAL | 0 refills | Status: DC | PRN

## 2022-05-07 NOTE — Telephone Encounter (Signed)
Patient called in stating she got in injection in her butt yesterday at the imaging place and has been in intense pain since then and she called [facility] Main and he advise her that she should take 2 Hydrocodone but she only has 2 left and she needs that called in today while she had someone to pick it up please advise

## 2022-05-08 ENCOUNTER — Telehealth: Payer: Self-pay | Admitting: Radiology

## 2022-05-08 NOTE — Telephone Encounter (Signed)
Patient called triage line in horrific pain. She states that she is unable to rest well, she is confused about whether she has taken her medicine although she wrote it down, and she feels like her blood pressure is continuing to climb. She does not feel the hydrocodone is helping and feels that she is in a medical emergency. I explained to patient that she should not take more pain medication as she is not sure whether she has already taken it or not. I expressed she should go to the emergency room to be evaluated if she is feeling confused and her blood pressure is continuing to "sky-rocket".  She states that she just sits around and cries because she is in terrific pain. I advised that I could get message to Dr. Louanne Skye, however, he is not in the clinic this afternoon and she may not get a response until tomorrow. I again expressed if she feels that she is in extreme distress that she should go to the ED.  She states that she will call a neighbor over to check her blood pressure and will then decide about going to ED. She would like to know what Dr. Louanne Skye thinks that she should do.  Please advise.

## 2022-05-09 ENCOUNTER — Telehealth: Payer: Self-pay | Admitting: Specialist

## 2022-05-09 ENCOUNTER — Other Ambulatory Visit: Payer: Self-pay | Admitting: Radiology

## 2022-05-09 DIAGNOSIS — M4726 Other spondylosis with radiculopathy, lumbar region: Secondary | ICD-10-CM

## 2022-05-09 DIAGNOSIS — M5416 Radiculopathy, lumbar region: Secondary | ICD-10-CM

## 2022-05-09 DIAGNOSIS — M4316 Spondylolisthesis, lumbar region: Secondary | ICD-10-CM

## 2022-05-09 NOTE — Telephone Encounter (Signed)
Patient called advised she is in so much pain. Patient said she spoke with Lurena Joiner and Dr. Lorin Mercy and was told to take 2 hydrocodone at a time and it's not helping with pain. Patient said he left side lower buttocks is swollen and hurts pretty bad. Patient said she don't know what to do. Patient said she is doing everything she was told to do. The number to contact patient is [phone]

## 2022-05-09 NOTE — Telephone Encounter (Signed)
I called and advised that per Dr. Louanne Skye we will order NCS and [facility] will be call her. As far as her pain she can go to the ED for due to her pain, there is nothing else that we can suggest for her pain

## 2022-05-10 ENCOUNTER — Telehealth: Payer: Self-pay

## 2022-05-10 ENCOUNTER — Other Ambulatory Visit: Payer: Self-pay | Admitting: Specialist

## 2022-05-10 MED ORDER — HYDROMORPHONE HCL 4 MG PO TABS: 4.0000 mg | ORAL_TABLET | Freq: Four times a day (QID) | ORAL | 0 refills | Status: DC | PRN

## 2022-05-10 NOTE — Telephone Encounter (Signed)
Pt called again

## 2022-05-10 NOTE — Telephone Encounter (Signed)
I called and changed referral to [facility] for EMG/NCS, Dr. Louanne Skye will call in her some Dilaudid.

## 2022-05-10 NOTE — Telephone Encounter (Signed)
Pt lvm on triage phone stating she would like a cb to discuss getting nerve study done Cb# [phone]

## 2022-05-13 ENCOUNTER — Encounter: Payer: Self-pay | Admitting: Neurology

## 2022-05-15 ENCOUNTER — Telehealth: Payer: Self-pay

## 2022-05-15 NOTE — Telephone Encounter (Signed)
I called and lmom that she can go to the ER for pain control, however that she really needs to make sure she goes the [facility] tomorrow as that test can tell us a lot.

## 2022-05-15 NOTE — Telephone Encounter (Signed)
Pt called and states that she was up all night with left sided pain. Called and lm on vm for triage line and states that she is taking her medication as directed and she is still in horrible pain and that it took her "2 hours to make a sandwich" because she is in so much discomfort and wants some to call her back to discuss. Cb [phone]

## 2022-05-16 ENCOUNTER — Telehealth: Payer: Self-pay

## 2022-05-16 ENCOUNTER — Ambulatory Visit (INDEPENDENT_AMBULATORY_CARE_PROVIDER_SITE_OTHER): Payer: Medicare Other | Admitting: Neurology

## 2022-05-16 ENCOUNTER — Other Ambulatory Visit: Payer: Self-pay | Admitting: Orthopedic Surgery

## 2022-05-16 ENCOUNTER — Other Ambulatory Visit: Payer: Self-pay

## 2022-05-16 DIAGNOSIS — R202 Paresthesia of skin: Secondary | ICD-10-CM | POA: Diagnosis not present

## 2022-05-16 DIAGNOSIS — M5417 Radiculopathy, lumbosacral region: Secondary | ICD-10-CM

## 2022-05-16 MED ORDER — OXYCODONE HCL ER 20 MG PO T12A: 20.0000 mg | EXTENDED_RELEASE_TABLET | Freq: Two times a day (BID) | ORAL | 0 refills | Status: DC

## 2022-05-16 NOTE — Telephone Encounter (Signed)
Patient would like a Rx refill on her pain medicine sent to her pharmacy.  CB# [phone].  Please advise.  Thank you.

## 2022-05-16 NOTE — Procedures (Signed)
[facility]  [address]  [place], [place] [address] Tel: [phone] Fax:  [phone] Test Date:  05/16/2022  Patient: Kristin Gilmore DOB: 1951-04-22 Physician: Narda Amber, DO  Sex: Female Height: '5\' 7"'$  Ref Phys: [name], MD  ID#: [id number]   Technician:    Patient Complaints: This is a 71 year old female with history of lumbar surgery referred for evaluation of low back and radicular pain.  NCV & EMG Findings: Extensive electrodiagnostic testing of the right lower extremity and additional studies of the left shows:  Bilateral sural and superficial peroneal sensory responses are within normal limits. Right peroneal (EBD) and bilateral peroneal (TA) motor responses show reduced amplitude (R1.3, L2.5, R2.1 mV).  Left peroneal (EDB) bilateral tibial H reflex studies are within normal limits.   Bilateral tibial H reflex studies show prolonged latency.   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  Impression: Chronic L5 radiculopathy affecting bilateral lower extremities, moderate and worse on the right. Chronic S1 radiculopathy affecting right lower extremities, mild. There is no evidence of a large fiber sensorimotor polyneuropathy.   ___________________________ Narda Amber, DO    Nerve Conduction Studies Anti Sensory Summary Table   Stim Site NR Peak (ms) Norm Peak (ms) P-T Amp (V) Norm P-T Amp  Left Sup Peroneal Anti Sensory (Ant Lat Mall)  32C  12 cm    3.7 <4.6 4.7 >3  Right Sup Peroneal Anti Sensory (Ant Lat Mall)  32C  12 cm    3.0 <4.6 5.8 >3  Left Sural Anti Sensory (Lat Mall)  32C  Calf    2.4 <4.6 3.6 >3  Right Sural Anti Sensory (Lat Mall)  32C  Calf    2.8 <4.6 3.5 >3   Motor Summary Table   Stim Site NR Onset (ms) Norm Onset (ms) O-P Amp (mV) Norm O-P Amp Site1 Site2 Delta-0 (ms) Dist (cm) Vel (m/s) Norm Vel (m/s)  Left Peroneal  Motor (Ext Dig Brev)  32C  Ankle    5.5 <6.0 3.1 >2.5 B Fib Ankle 8.3 33.0 40 >40  B Fib    13.8  2.5  Poplt B Fib 2.0 8.0 40 >40  Poplt    15.8  2.5         Right Peroneal Motor (Ext Dig Brev)  32C  Ankle    4.2 <6.0 1.3 >2.5 B Fib Ankle 8.8 36.0 41 >40  B Fib    13.0  1.3  Poplt B Fib 1.8 8.0 44 >40  Poplt    14.8  1.2         Left Peroneal TA Motor (Tib Ant)  32C  Fib Head    3.2 <4.5 2.5 >3 Poplit Fib Head 1.4 7.0 50 >40  Poplit    4.6  2.1         Right Peroneal TA Motor (Tib Ant)  32C  Fib Head    3.1 <4.5 2.1 >3 Poplit Fib Head 1.7 8.0 47 >40  Poplit    4.8  1.8         Left Tibial Motor (Abd Hall Brev)  32C  Ankle    5.0 <6.0 4.4 >4 Knee Ankle 10.1 43.0 43 >40  Knee    15.1  3.4         Right Tibial Motor (Abd Hall Brev)  32C  Ankle    3.8 <6.0 5.7 >4 Knee Ankle 11.3  45.0 40 >40  Knee    15.1  3.0          H Reflex Studies   NR H-Lat (ms) Lat Norm (ms) L-R H-Lat (ms)  Left Tibial (Gastroc)  32C     37.82 <35 1.50  Right Tibial (Gastroc)  32C     36.33 <35 1.50   EMG   Side Muscle Ins Act Fibs Psw Fasc Number Recrt Dur Dur. Amp Amp. Poly Poly. Comment  Right AntTibialis Nml Nml Nml Nml 2- Rapid Many 1+ Many 1+ Many 1+ N/A  Right Gastroc Nml Nml Nml Nml 1- Rapid Some 1+ Some 1+ Some 1+ N/A  Right Flex Dig Long Nml Nml Nml Nml 2- Rapid Many 1+ Many 1+ Many 1+ N/A  Right RectFemoris Nml Nml Nml Nml Nml Nml Nml Nml Nml Nml Nml Nml N/A  Right BicepsFemS Nml Nml Nml Nml 1- Rapid Some 1+ Some 1+ Some 1+ N/A  Right GluteusMed Nml Nml Nml Nml 1- Rapid Some 1+ Some 1+ Some 1+ N/A  Left BicepsFemS Nml Nml Nml Nml Nml Nml Nml Nml Nml Nml Nml Nml N/A  Left AntTibialis Nml Nml Nml Nml 2- Rapid Many 1+ Many 1+ Many 1+ N/A  Left Gastroc Nml Nml Nml Nml 1- Rapid Some 1+ Some 1+ Some 1+ N/A  Left RectFemoris Nml Nml Nml Nml Nml Nml Nml Nml Nml Nml Nml Nml N/A  Left GluteusMed Nml Nml Nml Nml 1- Rapid Some 1+ Some 1+ Some 1+ N/A  Left Flex Dig Long Nml Nml Nml Nml 2- Rapid Some  1+ Some 1+ Some 1+ N/A      Waveforms:

## 2022-05-17 ENCOUNTER — Other Ambulatory Visit: Payer: Self-pay | Admitting: Orthopedic Surgery

## 2022-05-17 ENCOUNTER — Other Ambulatory Visit: Payer: Self-pay | Admitting: Family

## 2022-05-17 ENCOUNTER — Telehealth: Payer: Self-pay | Admitting: Orthopedic Surgery

## 2022-05-17 MED ORDER — HYDROCODONE-ACETAMINOPHEN 5-325 MG PO TABS: 1.0000 | ORAL_TABLET | Freq: Four times a day (QID) | ORAL | 0 refills | Status: DC | PRN

## 2022-05-17 NOTE — Telephone Encounter (Signed)
It looks like Dr. Sharol Given had sent in an ER for this pt yesterday. It looks like she has had hydromorphone 4 mg # 20 filled on 05/10/2022, Hydrocodone 5/325 # 30 05/07/22 and again #20 on 05/10/2022 please see below.

## 2022-05-17 NOTE — Telephone Encounter (Signed)
I attempted to reach patient x 2. Voicemail picks up but does not state who phone belongs to-no message left.

## 2022-05-17 NOTE — Telephone Encounter (Signed)
I called and spoke with pharmacist. He states that they did not receive rx that you sent in yesterday. Would you please resend it? Thank you.

## 2022-05-17 NOTE — Telephone Encounter (Signed)
I called patient. She states that [facility] they do not have a prescription for her and she is in horrific pain and needs something. I advised Dr. Sharol Given sent in medication after 5pm yesterday and it shows that the pharmacy received it.  I explained that I will call pharmacy. Pharmacy closed until 2pm.

## 2022-05-17 NOTE — Telephone Encounter (Signed)
Talked with patient and advised her that [facility] on Thursday, [facility]/16/2022. Patient stated that she will call her pharmacy.

## 2022-05-17 NOTE — Telephone Encounter (Signed)
I called, voicemail picked up. No name on machine. I did not leave message.

## 2022-05-17 NOTE — Telephone Encounter (Signed)
Patient called. Says the pharmacy does not have the RX for her pain medication. Would like it called in for her.

## 2022-05-17 NOTE — Telephone Encounter (Signed)
Patient calling in stating pharmacy is saying they did not get the order for her Rx I assured her [facility] [address] and it was confirmed by them on 05/16/22 at 05:18 PM EDT. She is upset because she thinks we did not send her pain medication in I advised her she will need to talk to the pharmacy

## 2022-05-20 ENCOUNTER — Telehealth: Payer: Self-pay | Admitting: Specialist

## 2022-05-20 NOTE — Telephone Encounter (Signed)
Patient called asked if she can be worked into Dr. Otho Ket schedule because she is in so much pain she can not stand it. Patient said her friend is coming over to take her to the hospital if she can not get in today to see someone. Patient said she had a NCS and would like to know the results. The number to contact patient is [phone]

## 2022-05-21 ENCOUNTER — Encounter: Payer: Self-pay | Admitting: Surgery

## 2022-05-21 ENCOUNTER — Ambulatory Visit (INDEPENDENT_AMBULATORY_CARE_PROVIDER_SITE_OTHER): Payer: Medicare Other | Admitting: Surgery

## 2022-05-21 VITALS — BP 161/71 | HR 76

## 2022-05-21 DIAGNOSIS — M5416 Radiculopathy, lumbar region: Secondary | ICD-10-CM | POA: Diagnosis not present

## 2022-05-21 MED ORDER — METHYLPREDNISOLONE ACETATE 80 MG/ML IJ SUSP: 80.0000 mg | Freq: Once | INTRAMUSCULAR | Status: DC

## 2022-05-21 NOTE — Telephone Encounter (Signed)
Came in to see Jeneen Rinks today and put on Dr. Otho Ket schedule for Friday

## 2022-05-21 NOTE — Progress Notes (Signed)
71 year old white female returns with complaints of chronic low back pain and lower extremity radiculopathy.  Symptoms unchanged from her last office visit with Dr. Louanne Skye April 25, 2022.  Looks like patient had an appointment with Dr. Louanne Skye May 02, 2022 but this was canceled.  States that she is in severe pain.  she was previously advised to go to the emergency room for evaluation if her pain was that severe but she did not want to go and spend the money for that.  Patient had NCV/EMG study done May 16, 2022 that showed:  NCV & EMG Findings: Extensive electrodiagnostic testing of the right lower extremity and additional studies of the left shows:  Bilateral sural and superficial peroneal sensory responses are within normal limits. Right peroneal (EBD) and bilateral peroneal (TA) motor responses show reduced amplitude (R1.3, L2.5, R2.1 mV).  Left peroneal (EDB) bilateral tibial H reflex studies are within normal limits.   Bilateral tibial H reflex studies show prolonged latency.   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   Impression: Chronic L5 radiculopathy affecting bilateral lower extremities, moderate and worse on the right. Chronic S1 radiculopathy affecting right lower extremities, mild. There is no evidence of a large fiber sensorimotor polyneuropathy.     ___________________________ Narda Amber, DO  Patient also had previous lumbar CT scan done and Dr. Louanne Skye sent her for lumbar ESI and she reports no improvement with that.   Plan Today advised patient that there is not much I can do at this point.  She was scheduled an appointment to see Dr. Louanne Skye this Friday to discuss treatment options.  Options at this point would be surgery or referral to pain management.  Today she was given a Depo-Medrol 80 mg IM injection.  No change in her narcotics.

## 2022-05-22 ENCOUNTER — Other Ambulatory Visit: Payer: Self-pay | Admitting: Specialist

## 2022-05-22 ENCOUNTER — Telehealth: Payer: Self-pay | Admitting: Specialist

## 2022-05-22 DIAGNOSIS — M4726 Other spondylosis with radiculopathy, lumbar region: Secondary | ICD-10-CM

## 2022-05-22 NOTE — Telephone Encounter (Signed)
Pt called requesting to change back to hydromorphe. Pt states hydrocodone is not working. Also do you want her to go back methocarbamol. Please call pt at [phone]. Pt states she just spoke to Dr. Louanne Skye a few mins ago and forgot to ask. Please send to [place].

## 2022-05-23 ENCOUNTER — Emergency Department (HOSPITAL_BASED_OUTPATIENT_CLINIC_OR_DEPARTMENT_OTHER)
Admission: EM | Admit: 2022-05-23 | Discharge: 2022-05-23 | Disposition: A | Discharge status: Home / Self Care | Payer: Medicare Other | Attending: Emergency Medicine | Admitting: Emergency Medicine

## 2022-05-23 ENCOUNTER — Other Ambulatory Visit: Payer: Self-pay | Admitting: Specialist

## 2022-05-23 DIAGNOSIS — M5441 Lumbago with sciatica, right side: Secondary | ICD-10-CM | POA: Insufficient documentation

## 2022-05-23 DIAGNOSIS — Z7982 Long term (current) use of aspirin: Secondary | ICD-10-CM | POA: Diagnosis not present

## 2022-05-23 DIAGNOSIS — M5442 Lumbago with sciatica, left side: Secondary | ICD-10-CM | POA: Insufficient documentation

## 2022-05-23 DIAGNOSIS — M545 Low back pain, unspecified: Secondary | ICD-10-CM | POA: Diagnosis present

## 2022-05-23 DIAGNOSIS — G8929 Other chronic pain: Secondary | ICD-10-CM | POA: Insufficient documentation

## 2022-05-23 MED ORDER — LORAZEPAM 1 MG PO TABS
1.0000 mg | ORAL_TABLET | Freq: Once | ORAL | Status: AC
  Administered 2022-05-23: 1 mg via ORAL
  Filled 2022-05-23: qty 1

## 2022-05-23 MED ORDER — HYDROMORPHONE HCL 2 MG PO TABS: 2.0000 mg | ORAL_TABLET | ORAL | 0 refills | Status: DC | PRN

## 2022-05-23 MED ORDER — HYDROMORPHONE HCL 1 MG/ML IJ SOLN
2.0000 mg | Freq: Once | INTRAMUSCULAR | Status: AC
  Administered 2022-05-23: 2 mg via INTRAMUSCULAR
  Filled 2022-05-23: qty 2

## 2022-05-23 NOTE — Discharge Instructions (Addendum)
Follow-up with your doctors.  Sounds as if you may need to be referred to pain management.  Follow back up with orthopedics.  Take the pain medication you have at home.  Return for any new or worse symptoms

## 2022-05-23 NOTE — ED Triage Notes (Signed)
Pt here from home with c/o low back pain chronic in nature , pt was here for same 1 month ago , no trauma noted

## 2022-05-23 NOTE — Telephone Encounter (Signed)
Pt called again about change of medication. Please call pt about this matter. Pt states she is in severe pain. Pt phone number [phone].

## 2022-05-23 NOTE — ED Notes (Signed)
Called [facility] to pick up patient for home '@17'$ :27.  Per [facility] #5 on the list.

## 2022-05-23 NOTE — ED Notes (Signed)
Pt given crackers and water, per Shirlean Mylar - Medic

## 2022-05-23 NOTE — ED Provider Notes (Signed)
[facility] EMERGENCY DEPT Provider Note   CSN: [id number] Arrival date & time: 05/23/22  1436     History  Chief Complaint  Patient presents with   Back Pain   Pain Management    Kristin Gilmore is a 71 y.o. female.  Patient came in by EMS from home for worsening low back pain that does have radiation down the left leg and some into the right leg.  Patient followed by orthopedics Dr. Otho Ket office and she has had chronic pain in this area.  No trauma or injury.  Patient is on multiple pain medicines at home.  Last seen by [place] on July 24 for lumbar radicular pain more so on the right.  Patient without any incontinence.  Patient does walk with a walker.       Home Medications Prior to Admission medications   Medication Sig Start Date End Date Taking? Authorizing Provider  HYDROmorphone (DILAUDID) 4 MG tablet Take 1 tablet (4 mg total) by mouth every 6 (six) hours as needed for severe pain. 05/10/22   Jessy Oto, MD  Allantoin 0.5 % GEL Apply 1 application topically 2 (two) times daily.    [provider]  ALPRAZolam Duanne Moron) 0.25 MG tablet Take 1 tablet (0.25 mg total) by mouth 2 (two) times daily as needed for anxiety. 11/02/21   Jessy Oto, MD  amitriptyline (ELAVIL) 10 MG tablet Take 3 tablets (30 mg total) by mouth at bedtime. 09/04/21   Melvenia Beam, MD  amLODipine (NORVASC) 5 MG tablet Take 5 mg by mouth 2 (two) times daily. 07/27/18   [provider]  aspirin EC 81 MG tablet Take 1 tablet (81 mg total) by mouth 2 (two) times daily. Patient taking differently: Take 81 mg by mouth daily with breakfast. 06/26/21   Aundra Dubin, PA-C  Biotin 5000 MCG CAPS Take 5,000 mcg by mouth every morning.    [provider]  bisacodyl (DULCOLAX) 5 MG EC tablet Take 5-15 mg by mouth daily as needed (constipation).    [provider]  Calcium Citrate-Vitamin D (CALCIUM CITRATE + D PO) Take 1-2 capsules by mouth See admin  instructions. Calcium 200 mg, vitamin D3 2.5 mcg - 100 units - take 1 tablet by mouth with breakfast and 2 tablets with supper    [provider]  carvedilol (COREG) 25 MG tablet Take 12.5 mg by mouth 2 (two) times daily with a meal. 07/14/19   [provider]  Cholecalciferol (VITAMIN D3) 2000 UNITS TABS Take 4,000 Units by mouth daily with supper.    [provider]  Coenzyme Q10 (COQ10) 100 MG CAPS Take 100 mg by mouth daily with supper.    [provider]  docusate sodium (COLACE) 100 MG capsule Take 300 mg by mouth at bedtime as needed (constipation).    [provider]  ferrous sulfate 325 (65 FE) MG EC tablet Take 325 mg by mouth See admin instructions. Take one tablet (325) mg) by mouth every afternoon on an empty stomach 10/05/19   [provider]  furosemide (LASIX) 20 MG tablet Take 1 tablet (20 mg total) by mouth daily. 11/29/21   Prosperi, Christian H, PA-C  glimepiride (AMARYL) 2 MG tablet Take 1 mg by mouth daily before breakfast. 10/19/19   [provider]  Glucosamine-Chondroitin 500-400 MG CAPS Take 1-2 capsules by mouth See admin instructions. Take one capsule by mouth every morning, and two capsules at night    [provider]  HYDROcodone-acetaminophen (NORCO) 5-325 MG tablet Take 1 tablet by mouth every 6 (six) hours as needed for moderate pain. 05/17/22   Newt Minion, MD  ibuprofen (ADVIL) 200 MG tablet Take 400 mg by mouth daily as needed for headache.    [provider]  Lactobacillus (LACTINEX PO) Take 1 tablet by mouth daily.    [provider]  loperamide (IMODIUM) 2 MG capsule Take 2 mg by mouth 4 (four) times daily as needed for diarrhea or loose stools.    [provider]  MAGNESIUM CITRATE PO Take 150 mg by mouth at bedtime.    [provider]  Magnesium Oxide 500 MG TABS Take 500 mg by mouth at bedtime.    [provider]  Menthol, Topical Analgesic,  (BIOFREEZE) 4 % GEL Apply 1 application topically 4 (four) times daily as needed (pain).    [provider]  methocarbamol (ROBAXIN) 500 MG tablet Take 1 tablet (500 mg total) by mouth every 8 (eight) hours as needed for muscle spasms. 11/02/21   Jessy Oto, MD  mupirocin ointment (BACTROBAN) 2 % Place 1 application into the nose 3 (three) times daily. 06/16/21   [provider]  Omega-3 Fatty Acids (FISH OIL) 1000 MG CAPS Take 1,000 mg by mouth 2 (two) times daily.    [provider]  OVER THE COUNTER MEDICATION Take 1 capsule by mouth 2 (two) times daily. [place] brain blood vessel relaxation (butterburr root extract)    [provider]  OVER THE COUNTER MEDICATION Apply 1 application topically daily. Leg & back pain relief cream    [provider]  oxyCODONE (OXYCONTIN) 20 mg 12 hr tablet Take 1 tablet (20 mg total) by mouth every 12 (twelve) hours. 05/16/22   Newt Minion, MD  pantoprazole (PROTONIX) 40 MG tablet Take 40 mg by mouth 2 (two) times daily.    [provider]  Probiotic Product (PROBIOTIC PO) Take 1 capsule by mouth daily with breakfast. SUPREMA DOPHILUS 5 BILLION CFU    [provider]  QUEtiapine (SEROQUEL) 300 MG tablet TAKE 0.5 TABLETS (150 MG TOTAL) BY MOUTH AT BEDTIME. Patient taking differently: Take 150 mg by mouth at bedtime. 09/12/21   Cottle, Billey Co., MD  Red Yeast Rice 600 MG CAPS Take 1,200 mg by mouth 2 (two) times daily.    [provider]  Simethicone 180 MG CAPS Take 180 mg by mouth daily as needed (gas/bloating).    [provider]  sodium chloride (OCEAN) 0.65 % SOLN nasal spray Place 1 spray into both nostrils 3 (three) times daily.    [provider]  sulfamethoxazole-trimethoprim (BACTRIM DS) 800-160 MG tablet Take 1 tablet by mouth 2 (two) times daily. 02/14/22   Leandrew Koyanagi, MD  SUMAtriptan (IMITREX) 100 MG tablet Take 1 tablet (100 mg total) by mouth 2  (two) times daily as needed for up to 28 days for migraine. May repeat in 2 hours if headache persists or recurs. 02/28/22 03/28/22  Garvin Fila, MD  telmisartan (MICARDIS) 80 MG tablet Take 80 mg by mouth at bedtime.    [provider]  tolterodine (DETROL LA) 4 MG 24 hr capsule Take 4 mg by mouth daily with breakfast. 02/20/21   [provider]  trihexyphenidyl (ARTANE) 2 MG tablet Take 1 tablet (2 mg total) by mouth 3 (three) times daily with meals. 01/31/22   Penumalli, Earlean Polka, MD  Turmeric 500 MG CAPS Take  500 mg by mouth 2 (two) times daily with a meal.    [provider]  venlafaxine XR (EFFEXOR-XR) 75 MG 24 hr capsule TAKE 1 CAPSULE BY MOUTH DAILY WITH BREAKFAST. Patient taking differently: 75 mg daily with breakfast. 08/02/21   Kathrynn Ducking, MD      Allergies    Levofloxacin, Amoxicillin, Clarithromycin, and Atropine    Review of Systems   Review of Systems  Constitutional:  Negative for chills and fever.  HENT:  Negative for ear pain and sore throat.   Eyes:  Negative for pain and visual disturbance.  Respiratory:  Negative for cough and shortness of breath.   Cardiovascular:  Negative for chest pain and palpitations.  Gastrointestinal:  Negative for abdominal pain and vomiting.  Genitourinary:  Negative for difficulty urinating, dysuria and hematuria.  Musculoskeletal:  Positive for back pain. Negative for arthralgias.  Skin:  Negative for color change and rash.  Neurological:  Negative for seizures, syncope, weakness and numbness.  All other systems reviewed and are negative.   Physical Exam Updated Vital Signs BP (!) 161/91 (BP Location: Right Arm)   Pulse 78   Temp 97.9 F (36.6 C) (Tympanic)   Resp 20   SpO2 100%  Physical Exam Vitals and nursing note reviewed.  Constitutional:      General: She is not in acute distress.    Appearance: Normal appearance. She is well-developed.  HENT:     Head: Normocephalic and atraumatic.  Eyes:      Conjunctiva/sclera: Conjunctivae normal.  Cardiovascular:     Rate and Rhythm: Normal rate and regular rhythm.     Heart sounds: No murmur heard. Pulmonary:     Effort: Pulmonary effort is normal. No respiratory distress.     Breath sounds: Normal breath sounds.  Abdominal:     Palpations: Abdomen is soft.     Tenderness: There is no abdominal tenderness.  Musculoskeletal:        General: No swelling.     Cervical back: Neck supple.     Comments: Bilateral lower extremities with good strength in the feet.  No numbness to the top or the bottom of the foot.  Skin:    General: Skin is warm and dry.     Capillary Refill: Capillary refill takes less than 2 seconds.  Neurological:     General: No focal deficit present.     Mental Status: She is alert and oriented to person, place, and time.     Cranial Nerves: No cranial nerve deficit.     Sensory: No sensory deficit.     Motor: No weakness.  Psychiatric:        Mood and Affect: Mood normal.     ED Results / Procedures / Treatments   Labs (all labs ordered are listed, but only abnormal results are displayed) Labs Reviewed - No data to display  EKG None  Radiology No results found.  Procedures Procedures    Medications Ordered in ED Medications  LORazepam (ATIVAN) tablet 1 mg (1 mg Oral Given 05/23/22 1616)  HYDROmorphone (DILAUDID) injection 2 mg (2 mg Intramuscular Given 05/23/22 1617)    ED Course/ Medical Decision Making/ A&P                           Medical Decision Making Risk Prescription drug management.   Patient without any cauda equina symptoms.  Patient received Ativan orally and IM hydromorphone with significant improvement.  She is ambulating well.  Patient clearly has chronic back pain followed by Dr. Louanne Skye orthopedics to follow back up with them.  I think they are thinking about pain management for her.  Patient does have pain medications to take at home.  Patient will be transported back by  [facility].   Final Clinical Impression(s) / ED Diagnoses Final diagnoses:  Chronic bilateral low back pain with bilateral sciatica    Rx / DC Orders ED Discharge Orders     None         Fredia Sorrow, MD 05/23/22 1731

## 2022-05-24 ENCOUNTER — Ambulatory Visit: Payer: Medicare Other | Admitting: Specialist

## 2022-05-24 NOTE — Telephone Encounter (Signed)
Per chart, patient was seen in ED last night due to pain. Dr. Louanne Skye sent in hydromorphone to her pharmacy.

## 2022-05-27 ENCOUNTER — Emergency Department (HOSPITAL_BASED_OUTPATIENT_CLINIC_OR_DEPARTMENT_OTHER): Payer: Medicare Other

## 2022-05-27 ENCOUNTER — Encounter (HOSPITAL_COMMUNITY): Payer: Self-pay

## 2022-05-27 ENCOUNTER — Other Ambulatory Visit: Payer: Self-pay | Admitting: Specialist

## 2022-05-27 ENCOUNTER — Encounter (HOSPITAL_BASED_OUTPATIENT_CLINIC_OR_DEPARTMENT_OTHER): Payer: Self-pay | Admitting: Emergency Medicine

## 2022-05-27 ENCOUNTER — Other Ambulatory Visit: Payer: Self-pay

## 2022-05-27 ENCOUNTER — Inpatient Hospital Stay (HOSPITAL_BASED_OUTPATIENT_CLINIC_OR_DEPARTMENT_OTHER)
Admission: EM | Admit: 2022-05-27 | Discharge: 2022-06-08 | DRG: 872 | Disposition: A | Discharge status: Skilled Nursing Facility | Payer: Medicare Other | Attending: Family Medicine | Admitting: Family Medicine

## 2022-05-27 DIAGNOSIS — Z7982 Long term (current) use of aspirin: Secondary | ICD-10-CM

## 2022-05-27 DIAGNOSIS — M5416 Radiculopathy, lumbar region: Principal | ICD-10-CM

## 2022-05-27 DIAGNOSIS — G8929 Other chronic pain: Secondary | ICD-10-CM | POA: Diagnosis present

## 2022-05-27 DIAGNOSIS — F419 Anxiety disorder, unspecified: Secondary | ICD-10-CM | POA: Diagnosis present

## 2022-05-27 DIAGNOSIS — R7303 Prediabetes: Secondary | ICD-10-CM | POA: Diagnosis present

## 2022-05-27 DIAGNOSIS — A4101 Sepsis due to Methicillin susceptible Staphylococcus aureus: Secondary | ICD-10-CM | POA: Diagnosis not present

## 2022-05-27 DIAGNOSIS — B9561 Methicillin susceptible Staphylococcus aureus infection as the cause of diseases classified elsewhere: Secondary | ICD-10-CM

## 2022-05-27 DIAGNOSIS — N179 Acute kidney failure, unspecified: Secondary | ICD-10-CM

## 2022-05-27 DIAGNOSIS — Z79899 Other long term (current) drug therapy: Secondary | ICD-10-CM

## 2022-05-27 DIAGNOSIS — Z881 Allergy status to other antibiotic agents status: Secondary | ICD-10-CM

## 2022-05-27 DIAGNOSIS — M7031 Other bursitis of elbow, right elbow: Secondary | ICD-10-CM

## 2022-05-27 DIAGNOSIS — Z79891 Long term (current) use of opiate analgesic: Secondary | ICD-10-CM

## 2022-05-27 DIAGNOSIS — G20 Parkinson's disease: Secondary | ICD-10-CM | POA: Diagnosis present

## 2022-05-27 DIAGNOSIS — F3171 Bipolar disorder, in partial remission, most recent episode hypomanic: Secondary | ICD-10-CM

## 2022-05-27 DIAGNOSIS — G20A1 Parkinson's disease without dyskinesia, without mention of fluctuations: Secondary | ICD-10-CM | POA: Diagnosis present

## 2022-05-27 DIAGNOSIS — M48061 Spinal stenosis, lumbar region without neurogenic claudication: Secondary | ICD-10-CM | POA: Diagnosis present

## 2022-05-27 DIAGNOSIS — Z981 Arthrodesis status: Secondary | ICD-10-CM

## 2022-05-27 DIAGNOSIS — M1711 Unilateral primary osteoarthritis, right knee: Secondary | ICD-10-CM | POA: Diagnosis present

## 2022-05-27 DIAGNOSIS — E785 Hyperlipidemia, unspecified: Secondary | ICD-10-CM | POA: Diagnosis present

## 2022-05-27 DIAGNOSIS — Z9049 Acquired absence of other specified parts of digestive tract: Secondary | ICD-10-CM

## 2022-05-27 DIAGNOSIS — D179 Benign lipomatous neoplasm, unspecified: Secondary | ICD-10-CM | POA: Diagnosis present

## 2022-05-27 DIAGNOSIS — Z888 Allergy status to other drugs, medicaments and biological substances status: Secondary | ICD-10-CM

## 2022-05-27 DIAGNOSIS — E860 Dehydration: Secondary | ICD-10-CM | POA: Diagnosis present

## 2022-05-27 DIAGNOSIS — Z96651 Presence of right artificial knee joint: Secondary | ICD-10-CM | POA: Diagnosis present

## 2022-05-27 DIAGNOSIS — R6510 Systemic inflammatory response syndrome (SIRS) of non-infectious origin without acute organ dysfunction: Secondary | ICD-10-CM | POA: Diagnosis present

## 2022-05-27 DIAGNOSIS — D352 Benign neoplasm of pituitary gland: Secondary | ICD-10-CM | POA: Diagnosis present

## 2022-05-27 DIAGNOSIS — M71121 Other infective bursitis, right elbow: Secondary | ICD-10-CM | POA: Diagnosis present

## 2022-05-27 DIAGNOSIS — R079 Chest pain, unspecified: Secondary | ICD-10-CM | POA: Diagnosis not present

## 2022-05-27 DIAGNOSIS — M7072 Other bursitis of hip, left hip: Secondary | ICD-10-CM | POA: Diagnosis present

## 2022-05-27 DIAGNOSIS — R7881 Bacteremia: Secondary | ICD-10-CM

## 2022-05-27 DIAGNOSIS — Z20822 Contact with and (suspected) exposure to covid-19: Secondary | ICD-10-CM | POA: Diagnosis present

## 2022-05-27 DIAGNOSIS — Z91199 Patient's noncompliance with other medical treatment and regimen due to unspecified reason: Secondary | ICD-10-CM

## 2022-05-27 DIAGNOSIS — E875 Hyperkalemia: Secondary | ICD-10-CM | POA: Diagnosis present

## 2022-05-27 DIAGNOSIS — M5116 Intervertebral disc disorders with radiculopathy, lumbar region: Secondary | ICD-10-CM | POA: Diagnosis present

## 2022-05-27 DIAGNOSIS — I10 Essential (primary) hypertension: Secondary | ICD-10-CM | POA: Diagnosis present

## 2022-05-27 DIAGNOSIS — M549 Dorsalgia, unspecified: Secondary | ICD-10-CM | POA: Diagnosis present

## 2022-05-27 HISTORY — DX: Other complications of anesthesia, initial encounter: T88.59XA

## 2022-05-27 LAB — CBC WITH DIFFERENTIAL/PLATELET
Abs Immature Granulocytes: 0.03 10*3/uL (ref 0.00–0.07)
Basophils Absolute: 0 10*3/uL (ref 0.0–0.1)
Basophils Relative: 0 %
Eosinophils Absolute: 0.1 10*3/uL (ref 0.0–0.5)
Eosinophils Relative: 1 %
HCT: 38.3 % (ref 36.0–46.0)
Hemoglobin: 12.6 g/dL (ref 12.0–15.0)
Immature Granulocytes: 0 %
Lymphocytes Relative: 8 %
Lymphs Abs: 1 10*3/uL (ref 0.7–4.0)
MCH: 31 pg (ref 26.0–34.0)
MCHC: 32.9 g/dL (ref 30.0–36.0)
MCV: 94.3 fL (ref 80.0–100.0)
Monocytes Absolute: 1.3 10*3/uL — ABNORMAL HIGH (ref 0.1–1.0)
Monocytes Relative: 10 %
Neutro Abs: 10.3 10*3/uL — ABNORMAL HIGH (ref 1.7–7.7)
Neutrophils Relative %: 81 %
Platelets: 227 10*3/uL (ref 150–400)
RBC: 4.06 MIL/uL (ref 3.87–5.11)
RDW: 13.2 % (ref 11.5–15.5)
WBC: 12.7 10*3/uL — ABNORMAL HIGH (ref 4.0–10.5)
nRBC: 0 % (ref 0.0–0.2)

## 2022-05-27 LAB — BASIC METABOLIC PANEL
Anion gap: 9 (ref 5–15)
BUN: 42 mg/dL — ABNORMAL HIGH (ref 8–23)
CO2: 28 mmol/L (ref 22–32)
Calcium: 9.1 mg/dL (ref 8.9–10.3)
Chloride: 99 mmol/L (ref 98–111)
Creatinine, Ser: 1.07 mg/dL — ABNORMAL HIGH (ref 0.44–1.00)
GFR, Estimated: 55 mL/min — ABNORMAL LOW (ref >60)
Glucose, Bld: 118 mg/dL — ABNORMAL HIGH (ref 70–99)
Potassium: 5.3 mmol/L — ABNORMAL HIGH (ref 3.5–5.1)
Sodium: 136 mmol/L (ref 135–145)

## 2022-05-27 LAB — URINALYSIS, ROUTINE W REFLEX MICROSCOPIC
Bilirubin Urine: NEGATIVE
Glucose, UA: NEGATIVE mg/dL
Hgb urine dipstick: NEGATIVE
Ketones, ur: NEGATIVE mg/dL
Leukocytes,Ua: NEGATIVE
Nitrite: NEGATIVE
Specific Gravity, Urine: 1.028 (ref 1.005–1.030)
pH: 5.5 (ref 5.0–8.0)

## 2022-05-27 LAB — RESP PANEL BY RT-PCR (FLU A&B, COVID) ARPGX2
Influenza A by PCR: NEGATIVE
Influenza B by PCR: NEGATIVE
SARS Coronavirus 2 by RT PCR: NEGATIVE

## 2022-05-27 MED ORDER — IBUPROFEN 400 MG PO TABS: 600.0000 mg | ORAL_TABLET | Freq: Once | ORAL | Status: DC

## 2022-05-27 MED ORDER — HYDROMORPHONE HCL 1 MG/ML IJ SOLN
1.0000 mg | Freq: Once | INTRAMUSCULAR | Status: AC
  Administered 2022-05-27: 1 mg via INTRAMUSCULAR
  Filled 2022-05-27: qty 1

## 2022-05-27 MED ORDER — DIAZEPAM 5 MG PO TABS
5.0000 mg | ORAL_TABLET | Freq: Once | ORAL | Status: AC
  Administered 2022-05-27: 5 mg via ORAL
  Filled 2022-05-27: qty 1

## 2022-05-27 MED ORDER — KETOROLAC TROMETHAMINE 60 MG/2ML IM SOLN
30.0000 mg | Freq: Once | INTRAMUSCULAR | Status: AC
  Administered 2022-05-27: 30 mg via INTRAMUSCULAR
  Filled 2022-05-27: qty 2

## 2022-05-27 MED ORDER — OXYCODONE HCL 5 MG PO TABS
5.0000 mg | ORAL_TABLET | Freq: Once | ORAL | Status: AC
  Administered 2022-05-27: 5 mg via ORAL
  Filled 2022-05-27: qty 1

## 2022-05-27 MED ORDER — ACETAMINOPHEN 325 MG PO TABS
650.0000 mg | ORAL_TABLET | Freq: Once | ORAL | Status: AC
  Administered 2022-05-27: 650 mg via ORAL
  Filled 2022-05-27: qty 2

## 2022-05-27 MED ORDER — LACTATED RINGERS IV BOLUS
1000.0000 mL | Freq: Once | INTRAVENOUS | Status: AC
  Administered 2022-05-27: 1000 mL via INTRAVENOUS

## 2022-05-27 MED ORDER — OXYCODONE-ACETAMINOPHEN 5-325 MG PO TABS
1.0000 | ORAL_TABLET | Freq: Once | ORAL | Status: AC
  Administered 2022-05-27: 1 via ORAL
  Filled 2022-05-27: qty 1

## 2022-05-27 MED ORDER — SODIUM CHLORIDE 0.9 % IV SOLN: INTRAVENOUS | Status: DC

## 2022-05-27 MED ORDER — HYDROMORPHONE HCL 1 MG/ML IJ SOLN
1.0000 mg | Freq: Once | INTRAMUSCULAR | Status: AC
  Administered 2022-05-27: 1 mg via INTRAVENOUS
  Filled 2022-05-27: qty 1

## 2022-05-27 NOTE — Progress Notes (Signed)
Plan of Care Note for accepted transfer   Patient: Kristin Gilmore MRN: [id number]   DOA: 05/27/2022  Facility requesting transfer: [facility]. Requesting Provider: Lacretia Leigh, MD. Reason for transfer: Intractable back pain. Facility course:  Per Dr. Zenia Resides: " Chief Complaint  Patient presents with   Sciatica    Kristin Gilmore is a 71 y.o. female.   71 year old female presents with worsening chronic lower back pain with radicular symptoms.  No loss of bowel or bladder function.  Is seeing a spine surgeon for this currently.  Plan and is for medical management but may require surgical intervention according to her.  Denies any new history of trauma.  States pain is sharp and worse with any movement.  Denies any saddle anesthesia.  11:54 AM Attempted to discharge patient after pain medication given.  Patient states that she cannot walk.  She is given second dose of medication still unable to walk due to severe pain.  Discussed with patient's orthopedic surgeon, Dr. Louanne Skye, recommends admission for pain management and he will order nerve block.  Discussed with Dr. Olevia Bowens from [facility] hospitalist and he will admit"  Lab work: Basic metabolic panel [[id number]] (Abnormal)   Collected: 05/27/22 1046   Updated: 05/27/22 1136   Specimen Type: Blood   Specimen Source: Vein    Sodium 136 mmol/L   Potassium 5.3 High  mmol/L   Chloride 99 mmol/L   CO2 28 mmol/L   Glucose, Bld 118 High  mg/dL   BUN 42 High  mg/dL   Creatinine, Ser 1.07 High  mg/dL   Calcium 9.1 mg/dL   GFR, Estimated 55 Low  mL/min   Anion gap 9  CBC with Differential/Platelet [[id number]] (Abnormal)   Collected: 05/27/22 1046   Updated: 05/27/22 1102   Specimen Type: Blood   Specimen Source: Vein    WBC 12.7 High  K/uL   RBC 4.06 MIL/uL   Hemoglobin 12.6 g/dL   HCT 38.3 %   MCV 94.3 fL   MCH 31.0 pg   MCHC 32.9 g/dL   RDW 13.2 %   Platelets 227 K/uL   nRBC 0.0 %   Neutrophils Relative % 81 %   Neutro Abs 10.3  High  K/uL   Lymphocytes Relative 8 %   Lymphs Abs 1.0 K/uL   Monocytes Relative 10 %   Monocytes Absolute 1.3 High  K/uL   Eosinophils Relative 1 %   Eosinophils Absolute 0.1 K/uL   Basophils Relative 0 %   Basophils Absolute 0.0 K/uL   Immature Granulocytes 0 %   Abs Immature Granulocytes 0.03 K/uL   Plan of care: The patient is accepted for admission to Telemetry unit, at [facility]   Author: Reubin Milan, MD 05/27/2022  Check [email or web address] for on-call coverage.  Nursing staff, Please call [place] number on Amion as soon as patient's arrival, so appropriate admitting provider can evaluate the pt.

## 2022-05-27 NOTE — ED Notes (Signed)
Pt continues to scream out repeatedly. Pt given gingerale, her bed adjusted, and door left cracked per her request.

## 2022-05-27 NOTE — ED Notes (Signed)
Patient refusing to get into bed.  States unable to lay down due to back pain. Express concern for risk of falling due to pain meds given.

## 2022-05-27 NOTE — ED Triage Notes (Signed)
Pt arrives to ED with c/o sciatica that started this morning.

## 2022-05-27 NOTE — ED Provider Notes (Addendum)
[facility] EMERGENCY DEPT Provider Note   CSN: [id number] Arrival date & time: 05/27/22  0753     History  Chief Complaint  Patient presents with   Sciatica    Kristin Gilmore is a 71 y.o. female.  71 year old female presents with worsening chronic lower back pain with radicular symptoms.  No loss of bowel or bladder function.  Is seeing a spine surgeon for this currently.  Plan and is for medical management but may require surgical intervention according to her.  Denies any new history of trauma.  States pain is sharp and worse with any movement.  Denies any saddle anesthesia.       Home Medications Prior to Admission medications   Medication Sig Start Date End Date Taking? Authorizing Provider  Allantoin 0.5 % GEL Apply 1 application topically 2 (two) times daily.    [provider]  ALPRAZolam Duanne Moron) 0.25 MG tablet Take 1 tablet (0.25 mg total) by mouth 2 (two) times daily as needed for anxiety. 11/02/21   Jessy Oto, MD  amitriptyline (ELAVIL) 10 MG tablet Take 3 tablets (30 mg total) by mouth at bedtime. 09/04/21   Melvenia Beam, MD  amLODipine (NORVASC) 5 MG tablet Take 5 mg by mouth 2 (two) times daily. 07/27/18   [provider]  aspirin EC 81 MG tablet Take 1 tablet (81 mg total) by mouth 2 (two) times daily. Patient taking differently: Take 81 mg by mouth daily with breakfast. 06/26/21   Aundra Dubin, PA-C  Biotin 5000 MCG CAPS Take 5,000 mcg by mouth every morning.    [provider]  bisacodyl (DULCOLAX) 5 MG EC tablet Take 5-15 mg by mouth daily as needed (constipation).    [provider]  Calcium Citrate-Vitamin D (CALCIUM CITRATE + D PO) Take 1-2 capsules by mouth See admin instructions. Calcium 200 mg, vitamin D3 2.5 mcg - 100 units - take 1 tablet by mouth with breakfast and 2 tablets with supper    [provider]  carvedilol (COREG) 25 MG tablet Take 12.5 mg by mouth 2 (two) times daily  with a meal. 07/14/19   [provider]  Cholecalciferol (VITAMIN D3) 2000 UNITS TABS Take 4,000 Units by mouth daily with supper.    [provider]  Coenzyme Q10 (COQ10) 100 MG CAPS Take 100 mg by mouth daily with supper.    [provider]  docusate sodium (COLACE) 100 MG capsule Take 300 mg by mouth at bedtime as needed (constipation).    [provider]  ferrous sulfate 325 (65 FE) MG EC tablet Take 325 mg by mouth See admin instructions. Take one tablet (325) mg) by mouth every afternoon on an empty stomach 10/05/19   [provider]  furosemide (LASIX) 20 MG tablet Take 1 tablet (20 mg total) by mouth daily. 11/29/21   Prosperi, Christian H, PA-C  glimepiride (AMARYL) 2 MG tablet Take 1 mg by mouth daily before breakfast. 10/19/19   [provider]  Glucosamine-Chondroitin 500-400 MG CAPS Take 1-2 capsules by mouth See admin instructions. Take one capsule by mouth every morning, and two capsules at night    [provider]  HYDROcodone-acetaminophen (NORCO) 5-325 MG tablet Take 1 tablet by mouth every 6 (six) hours as needed for moderate pain. 05/17/22   Newt Minion, MD  HYDROmorphone (DILAUDID) 2 MG tablet Take 1 tablet (2 mg total) by mouth every 4 (four) hours as needed for severe pain. 05/23/22   Louanne Skye,  Daleen Bo, MD  ibuprofen (ADVIL) 200 MG tablet Take 400 mg by mouth daily as needed for headache.    [provider]  Lactobacillus (LACTINEX PO) Take 1 tablet by mouth daily.    [provider]  loperamide (IMODIUM) 2 MG capsule Take 2 mg by mouth 4 (four) times daily as needed for diarrhea or loose stools.    [provider]  MAGNESIUM CITRATE PO Take 150 mg by mouth at bedtime.    [provider]  Magnesium Oxide 500 MG TABS Take 500 mg by mouth at bedtime.    [provider]  Menthol, Topical Analgesic, (BIOFREEZE) 4 % GEL Apply 1 application topically 4 (four) times daily as needed  (pain).    [provider]  methocarbamol (ROBAXIN) 500 MG tablet Take 1 tablet (500 mg total) by mouth every 8 (eight) hours as needed for muscle spasms. 11/02/21   Jessy Oto, MD  mupirocin ointment (BACTROBAN) 2 % Place 1 application into the nose 3 (three) times daily. 06/16/21   [provider]  Omega-3 Fatty Acids (FISH OIL) 1000 MG CAPS Take 1,000 mg by mouth 2 (two) times daily.    [provider]  OVER THE COUNTER MEDICATION Take 1 capsule by mouth 2 (two) times daily. [place] brain blood vessel relaxation (butterburr root extract)    [provider]  OVER THE COUNTER MEDICATION Apply 1 application topically daily. Leg & back pain relief cream    [provider]  oxyCODONE (OXYCONTIN) 20 mg 12 hr tablet Take 1 tablet (20 mg total) by mouth every 12 (twelve) hours. 05/16/22   Newt Minion, MD  pantoprazole (PROTONIX) 40 MG tablet Take 40 mg by mouth 2 (two) times daily.    [provider]  Probiotic Product (PROBIOTIC PO) Take 1 capsule by mouth daily with breakfast. SUPREMA DOPHILUS 5 BILLION CFU    [provider]  QUEtiapine (SEROQUEL) 300 MG tablet TAKE 0.5 TABLETS (150 MG TOTAL) BY MOUTH AT BEDTIME. Patient taking differently: Take 150 mg by mouth at bedtime. 09/12/21   Cottle, Billey Co., MD  Red Yeast Rice 600 MG CAPS Take 1,200 mg by mouth 2 (two) times daily.    [provider]  Simethicone 180 MG CAPS Take 180 mg by mouth daily as needed (gas/bloating).    [provider]  sodium chloride (OCEAN) 0.65 % SOLN nasal spray Place 1 spray into both nostrils 3 (three) times daily.    [provider]  sulfamethoxazole-trimethoprim (BACTRIM DS) 800-160 MG tablet Take 1 tablet by mouth 2 (two) times daily. 02/14/22   Leandrew Koyanagi, MD  SUMAtriptan (IMITREX) 100 MG tablet Take 1 tablet (100 mg total) by mouth 2 (two) times daily as needed for up to 28 days for migraine. May repeat in 2 hours  if headache persists or recurs. 02/28/22 03/28/22  Garvin Fila, MD  telmisartan (MICARDIS) 80 MG tablet Take 80 mg by mouth at bedtime.    [provider]  tolterodine (DETROL LA) 4 MG 24 hr capsule Take 4 mg by mouth daily with breakfast. 02/20/21   [provider]  trihexyphenidyl (ARTANE) 2 MG tablet Take 1 tablet (2 mg total) by mouth 3 (three) times daily with meals. 01/31/22   Penumalli, Earlean Polka, MD  Turmeric 500 MG CAPS Take 500 mg by mouth 2 (two) times daily with a meal.    [provider]  venlafaxine XR (EFFEXOR-XR) 75 MG 24 hr  capsule TAKE 1 CAPSULE BY MOUTH DAILY WITH BREAKFAST. Patient taking differently: 75 mg daily with breakfast. 08/02/21   Kathrynn Ducking, MD      Allergies    Levofloxacin, Amoxicillin, Clarithromycin, and Atropine    Review of Systems   Review of Systems  All other systems reviewed and are negative.   Physical Exam Updated Vital Signs BP (!) 143/90 (BP Location: Right Arm)   Pulse 74   Temp 98.2 F (36.8 C) (Oral)   Resp 18   Ht 1.702 m ('5\' 7"'$ )   Wt 72.6 kg   SpO2 100%   BMI 25.06 kg/m  Physical Exam Vitals and nursing note reviewed.  Constitutional:      General: She is not in acute distress.    Appearance: Normal appearance. She is well-developed. She is not toxic-appearing.  HENT:     Head: Normocephalic and atraumatic.  Eyes:     General: Lids are normal.     Conjunctiva/sclera: Conjunctivae normal.     Pupils: Pupils are equal, round, and reactive to light.  Neck:     Thyroid: No thyroid mass.     Trachea: No tracheal deviation.  Cardiovascular:     Rate and Rhythm: Normal rate and regular rhythm.     Heart sounds: Normal heart sounds. No murmur heard.    No gallop.  Pulmonary:     Effort: Pulmonary effort is normal. No respiratory distress.     Breath sounds: Normal breath sounds. No stridor. No decreased breath sounds, wheezing, rhonchi or rales.  Abdominal:     General: There is no distension.      Palpations: Abdomen is soft.     Tenderness: There is no abdominal tenderness. There is no rebound.  Musculoskeletal:        General: No tenderness. Normal range of motion.     Cervical back: Normal range of motion and neck supple.       Back:  Skin:    General: Skin is warm and dry.     Findings: No abrasion or rash.  Neurological:     General: No focal deficit present.     Mental Status: She is alert and oriented to person, place, and time. Mental status is at baseline.     GCS: GCS eye subscore is 4. GCS verbal subscore is 5. GCS motor subscore is 6.     Cranial Nerves: Cranial nerves 2-12 are intact. No cranial nerve deficit.     Sensory: Sensation is intact. No sensory deficit.     Motor: Motor function is intact.     Comments: Strength is 5 of 5 in bilateral lower extremities.  Psychiatric:        Attention and Perception: Attention normal.        Speech: Speech normal.        Behavior: Behavior normal.     ED Results / Procedures / Treatments   Labs (all labs ordered are listed, but only abnormal results are displayed) Labs Reviewed - No data to display  EKG None  Radiology No results found.  Procedures Procedures    Medications Ordered in ED Medications  diazepam (VALIUM) tablet 5 mg (has no administration in time range)  HYDROmorphone (DILAUDID) injection 1 mg (has no administration in time range)    ED Course/ Medical Decision Making/ A&P                           Medical  Decision Making Amount and/or Complexity of Data Reviewed Labs: ordered.  Risk Prescription drug management.   Patient medicated for pain here and does feel better.  She has no focal neurological deficits at this time.  No evidence of cauda equina.  Do not feel that she would benefit from having imaging at this time.  Suspect her symptoms are from chronic pain.  Will discharge and she will follow-up with her doctor  11:54 AM Attempted to discharge patient after pain medication  given.  Patient states that she cannot walk.  She is given second dose of medication still unable to walk due to severe pain.  Discussed with patient's orthopedic surgeon, Dr. Louanne Skye, recommends admission for pain management and he will order nerve block.  Discussed with Dr. Olevia Bowens from [facility]ist and he will admit      Final Clinical Impression(s) / ED Diagnoses Final diagnoses:  None    Rx / DC Orders ED Discharge Orders     None         Lacretia Leigh, MD 05/27/22 4098    Lacretia Leigh, MD 05/27/22 1154

## 2022-05-28 DIAGNOSIS — R6510 Systemic inflammatory response syndrome (SIRS) of non-infectious origin without acute organ dysfunction: Secondary | ICD-10-CM | POA: Diagnosis not present

## 2022-05-28 DIAGNOSIS — D352 Benign neoplasm of pituitary gland: Secondary | ICD-10-CM | POA: Diagnosis present

## 2022-05-28 DIAGNOSIS — F3171 Bipolar disorder, in partial remission, most recent episode hypomanic: Secondary | ICD-10-CM | POA: Diagnosis present

## 2022-05-28 DIAGNOSIS — I340 Nonrheumatic mitral (valve) insufficiency: Secondary | ICD-10-CM | POA: Diagnosis not present

## 2022-05-28 DIAGNOSIS — Z79899 Other long term (current) drug therapy: Secondary | ICD-10-CM | POA: Diagnosis not present

## 2022-05-28 DIAGNOSIS — M7072 Other bursitis of hip, left hip: Secondary | ICD-10-CM | POA: Diagnosis present

## 2022-05-28 DIAGNOSIS — E875 Hyperkalemia: Secondary | ICD-10-CM | POA: Diagnosis present

## 2022-05-28 DIAGNOSIS — F419 Anxiety disorder, unspecified: Secondary | ICD-10-CM | POA: Diagnosis present

## 2022-05-28 DIAGNOSIS — M48062 Spinal stenosis, lumbar region with neurogenic claudication: Secondary | ICD-10-CM

## 2022-05-28 DIAGNOSIS — M549 Dorsalgia, unspecified: Secondary | ICD-10-CM | POA: Diagnosis not present

## 2022-05-28 DIAGNOSIS — I10 Essential (primary) hypertension: Secondary | ICD-10-CM | POA: Diagnosis present

## 2022-05-28 DIAGNOSIS — Z91199 Patient's noncompliance with other medical treatment and regimen due to unspecified reason: Secondary | ICD-10-CM | POA: Diagnosis not present

## 2022-05-28 DIAGNOSIS — M71121 Other infective bursitis, right elbow: Secondary | ICD-10-CM | POA: Diagnosis present

## 2022-05-28 DIAGNOSIS — Z7982 Long term (current) use of aspirin: Secondary | ICD-10-CM | POA: Diagnosis not present

## 2022-05-28 DIAGNOSIS — Z96651 Presence of right artificial knee joint: Secondary | ICD-10-CM | POA: Diagnosis present

## 2022-05-28 DIAGNOSIS — D179 Benign lipomatous neoplasm, unspecified: Secondary | ICD-10-CM | POA: Diagnosis present

## 2022-05-28 DIAGNOSIS — M1711 Unilateral primary osteoarthritis, right knee: Secondary | ICD-10-CM | POA: Diagnosis present

## 2022-05-28 DIAGNOSIS — M5116 Intervertebral disc disorders with radiculopathy, lumbar region: Secondary | ICD-10-CM | POA: Diagnosis present

## 2022-05-28 DIAGNOSIS — N289 Disorder of kidney and ureter, unspecified: Secondary | ICD-10-CM | POA: Diagnosis not present

## 2022-05-28 DIAGNOSIS — E860 Dehydration: Secondary | ICD-10-CM | POA: Diagnosis present

## 2022-05-28 DIAGNOSIS — A4101 Sepsis due to Methicillin susceptible Staphylococcus aureus: Secondary | ICD-10-CM | POA: Diagnosis present

## 2022-05-28 DIAGNOSIS — M7021 Olecranon bursitis, right elbow: Secondary | ICD-10-CM | POA: Diagnosis not present

## 2022-05-28 DIAGNOSIS — R079 Chest pain, unspecified: Secondary | ICD-10-CM | POA: Diagnosis not present

## 2022-05-28 DIAGNOSIS — M48061 Spinal stenosis, lumbar region without neurogenic claudication: Secondary | ICD-10-CM | POA: Diagnosis present

## 2022-05-28 DIAGNOSIS — R7303 Prediabetes: Secondary | ICD-10-CM | POA: Diagnosis present

## 2022-05-28 DIAGNOSIS — G8929 Other chronic pain: Secondary | ICD-10-CM | POA: Diagnosis present

## 2022-05-28 DIAGNOSIS — G20 Parkinson's disease: Secondary | ICD-10-CM | POA: Diagnosis present

## 2022-05-28 DIAGNOSIS — N179 Acute kidney failure, unspecified: Secondary | ICD-10-CM | POA: Diagnosis present

## 2022-05-28 DIAGNOSIS — Z20822 Contact with and (suspected) exposure to covid-19: Secondary | ICD-10-CM | POA: Diagnosis present

## 2022-05-28 DIAGNOSIS — M1990 Unspecified osteoarthritis, unspecified site: Secondary | ICD-10-CM | POA: Diagnosis not present

## 2022-05-28 DIAGNOSIS — M5416 Radiculopathy, lumbar region: Secondary | ICD-10-CM | POA: Diagnosis present

## 2022-05-28 DIAGNOSIS — B9561 Methicillin susceptible Staphylococcus aureus infection as the cause of diseases classified elsewhere: Secondary | ICD-10-CM | POA: Diagnosis not present

## 2022-05-28 DIAGNOSIS — E785 Hyperlipidemia, unspecified: Secondary | ICD-10-CM | POA: Diagnosis present

## 2022-05-28 DIAGNOSIS — R7881 Bacteremia: Secondary | ICD-10-CM | POA: Diagnosis not present

## 2022-05-28 DIAGNOSIS — D649 Anemia, unspecified: Secondary | ICD-10-CM | POA: Diagnosis not present

## 2022-05-28 LAB — CBC WITH DIFFERENTIAL/PLATELET
Abs Immature Granulocytes: 0.15 10*3/uL — ABNORMAL HIGH (ref 0.00–0.07)
Basophils Absolute: 0.1 10*3/uL (ref 0.0–0.1)
Basophils Relative: 0 %
Eosinophils Absolute: 0.1 10*3/uL (ref 0.0–0.5)
Eosinophils Relative: 0 %
HCT: 35.5 % — ABNORMAL LOW (ref 36.0–46.0)
Hemoglobin: 12.1 g/dL (ref 12.0–15.0)
Immature Granulocytes: 1 %
Lymphocytes Relative: 8 %
Lymphs Abs: 1.3 10*3/uL (ref 0.7–4.0)
MCH: 31.5 pg (ref 26.0–34.0)
MCHC: 34.1 g/dL (ref 30.0–36.0)
MCV: 92.4 fL (ref 80.0–100.0)
Monocytes Absolute: 1.9 10*3/uL — ABNORMAL HIGH (ref 0.1–1.0)
Monocytes Relative: 12 %
Neutro Abs: 13.3 10*3/uL — ABNORMAL HIGH (ref 1.7–7.7)
Neutrophils Relative %: 79 %
Platelets: 219 10*3/uL (ref 150–400)
RBC: 3.84 MIL/uL — ABNORMAL LOW (ref 3.87–5.11)
RDW: 13.6 % (ref 11.5–15.5)
WBC: 16.8 10*3/uL — ABNORMAL HIGH (ref 4.0–10.5)
nRBC: 0 % (ref 0.0–0.2)

## 2022-05-28 LAB — COMPREHENSIVE METABOLIC PANEL
ALT: 28 U/L (ref 0–44)
AST: 26 U/L (ref 15–41)
Albumin: 3.3 g/dL — ABNORMAL LOW (ref 3.5–5.0)
Alkaline Phosphatase: 75 U/L (ref 38–126)
Anion gap: 8 (ref 5–15)
BUN: 24 mg/dL — ABNORMAL HIGH (ref 8–23)
CO2: 23 mmol/L (ref 22–32)
Calcium: 8.5 mg/dL — ABNORMAL LOW (ref 8.9–10.3)
Chloride: 101 mmol/L (ref 98–111)
Creatinine, Ser: 0.88 mg/dL (ref 0.44–1.00)
GFR, Estimated: >60 mL/min (ref >60)
Glucose, Bld: 130 mg/dL — ABNORMAL HIGH (ref 70–99)
Potassium: 4.2 mmol/L (ref 3.5–5.1)
Sodium: 132 mmol/L — ABNORMAL LOW (ref 135–145)
Total Bilirubin: 0.6 mg/dL (ref 0.3–1.2)
Total Protein: 6.3 g/dL — ABNORMAL LOW (ref 6.5–8.1)

## 2022-05-28 LAB — APTT: aPTT: 33 seconds (ref 24–36)

## 2022-05-28 LAB — C-REACTIVE PROTEIN: CRP: 32.1 mg/dL — ABNORMAL HIGH (ref <1.0)

## 2022-05-28 LAB — PROTIME-INR
INR: 1.2 (ref 0.8–1.2)
Prothrombin Time: 15.3 seconds — ABNORMAL HIGH (ref 11.4–15.2)

## 2022-05-28 LAB — SEDIMENTATION RATE: Sed Rate: 41 mm/hr — ABNORMAL HIGH (ref 0–22)

## 2022-05-28 LAB — LACTIC ACID, PLASMA
Lactic Acid, Venous: 1 mmol/L (ref 0.5–1.9)
Lactic Acid, Venous: 0.9 mmol/L (ref 0.5–1.9)

## 2022-05-28 MED ORDER — HYDROMORPHONE HCL 1 MG/ML IJ SOLN
1.0000 mg | Freq: Once | INTRAMUSCULAR | Status: AC
  Administered 2022-05-28: 1 mg via INTRAVENOUS
  Filled 2022-05-28: qty 1

## 2022-05-28 MED ORDER — ACETAMINOPHEN 650 MG RE SUPP: 650.0000 mg | Freq: Four times a day (QID) | RECTAL | Status: DC | PRN

## 2022-05-28 MED ORDER — VANCOMYCIN HCL 1250 MG/250ML IV SOLN
1250.0000 mg | INTRAVENOUS | Status: DC
  Administered 2022-05-29: 1250 mg via INTRAVENOUS
  Filled 2022-05-28: qty 250

## 2022-05-28 MED ORDER — SODIUM CHLORIDE 0.9 % IV SOLN: INTRAVENOUS | Status: AC

## 2022-05-28 MED ORDER — ACETAMINOPHEN 325 MG PO TABS
650.0000 mg | ORAL_TABLET | Freq: Four times a day (QID) | ORAL | Status: DC | PRN
  Administered 2022-06-05 – 2022-06-08 (×3): 650 mg via ORAL
  Filled 2022-05-28 (×3): qty 2

## 2022-05-28 MED ORDER — SODIUM CHLORIDE 0.9% FLUSH
3.0000 mL | Freq: Two times a day (BID) | INTRAVENOUS | Status: DC
  Administered 2022-05-28 – 2022-06-08 (×19): 3 mL via INTRAVENOUS

## 2022-05-28 MED ORDER — ALPRAZOLAM 0.25 MG PO TABS
0.2500 mg | ORAL_TABLET | Freq: Two times a day (BID) | ORAL | Status: DC | PRN
Start: 2022-05-28 — End: 2022-06-08
  Administered 2022-05-28 – 2022-06-08 (×20): 0.25 mg via ORAL
  Filled 2022-05-28 (×20): qty 1

## 2022-05-28 MED ORDER — HYDROMORPHONE HCL 2 MG PO TABS
2.0000 mg | ORAL_TABLET | ORAL | Status: DC | PRN
  Administered 2022-05-28: 4 mg via ORAL
  Administered 2022-05-29: 2 mg via ORAL
  Administered 2022-05-29 – 2022-05-30 (×2): 4 mg via ORAL
  Administered 2022-05-30: 2 mg via ORAL
  Administered 2022-05-30 (×2): 4 mg via ORAL
  Administered 2022-05-31 – 2022-06-02 (×8): 2 mg via ORAL
  Administered 2022-06-02 – 2022-06-08 (×19): 4 mg via ORAL
  Filled 2022-05-28 (×5): qty 2
  Filled 2022-05-28: qty 1
  Filled 2022-05-28 (×2): qty 2
  Filled 2022-05-28: qty 1
  Filled 2022-05-28 (×2): qty 2
  Filled 2022-05-28 (×2): qty 1
  Filled 2022-05-28 (×3): qty 2
  Filled 2022-05-28: qty 1
  Filled 2022-05-28: qty 2
  Filled 2022-05-28: qty 1
  Filled 2022-05-28: qty 2
  Filled 2022-05-28: qty 1
  Filled 2022-05-28 (×2): qty 2
  Filled 2022-05-28: qty 1
  Filled 2022-05-28 (×6): qty 2
  Filled 2022-05-28: qty 1
  Filled 2022-05-28 (×2): qty 2
  Filled 2022-05-28 (×2): qty 1
  Filled 2022-05-28: qty 2

## 2022-05-28 MED ORDER — ALBUTEROL SULFATE (2.5 MG/3ML) 0.083% IN NEBU
2.5000 mg | INHALATION_SOLUTION | Freq: Four times a day (QID) | RESPIRATORY_TRACT | Status: DC | PRN
Start: 2022-05-28 — End: 2022-06-08

## 2022-05-28 MED ORDER — CARVEDILOL 12.5 MG PO TABS
12.5000 mg | ORAL_TABLET | Freq: Two times a day (BID) | ORAL | Status: DC
  Administered 2022-05-28 – 2022-06-08 (×21): 12.5 mg via ORAL
  Filled 2022-05-28 (×22): qty 1

## 2022-05-28 MED ORDER — IRBESARTAN 300 MG PO TABS
300.0000 mg | ORAL_TABLET | Freq: Every day | ORAL | Status: DC
  Administered 2022-05-28 – 2022-06-08 (×11): 300 mg via ORAL
  Filled 2022-05-28 (×12): qty 1

## 2022-05-28 MED ORDER — ENOXAPARIN SODIUM 40 MG/0.4ML IJ SOSY
40.0000 mg | PREFILLED_SYRINGE | INTRAMUSCULAR | Status: DC
  Administered 2022-05-28 – 2022-06-07 (×11): 40 mg via SUBCUTANEOUS
  Filled 2022-05-28 (×11): qty 0.4

## 2022-05-28 MED ORDER — HYDROCODONE-ACETAMINOPHEN 7.5-325 MG PO TABS
1.0000 | ORAL_TABLET | ORAL | Status: DC | PRN
  Administered 2022-05-28 – 2022-06-08 (×22): 1 via ORAL
  Filled 2022-05-28 (×25): qty 1

## 2022-05-28 MED ORDER — ENOXAPARIN SODIUM 40 MG/0.4ML IJ SOSY: 40.0000 mg | PREFILLED_SYRINGE | INTRAMUSCULAR | Status: DC

## 2022-05-28 MED ORDER — SODIUM CHLORIDE 0.9 % IV SOLN
2.0000 g | Freq: Once | INTRAVENOUS | Status: AC
  Administered 2022-05-28: 2 g via INTRAVENOUS
  Filled 2022-05-28: qty 12.5

## 2022-05-28 MED ORDER — IBUPROFEN 600 MG PO TABS
600.0000 mg | ORAL_TABLET | Freq: Once | ORAL | Status: AC
  Administered 2022-05-29: 600 mg via ORAL
  Filled 2022-05-28: qty 1

## 2022-05-28 MED ORDER — SODIUM CHLORIDE 0.9 % IV SOLN
2.0000 g | Freq: Two times a day (BID) | INTRAVENOUS | Status: DC
  Administered 2022-05-28 – 2022-05-29 (×2): 2 g via INTRAVENOUS
  Filled 2022-05-28 (×2): qty 12.5

## 2022-05-28 MED ORDER — HYDROMORPHONE HCL 2 MG PO TABS
2.0000 mg | ORAL_TABLET | ORAL | Status: DC | PRN
  Administered 2022-05-28: 2 mg via ORAL
  Filled 2022-05-28: qty 1

## 2022-05-28 MED ORDER — VANCOMYCIN HCL 1500 MG/300ML IV SOLN
1500.0000 mg | Freq: Once | INTRAVENOUS | Status: AC
  Administered 2022-05-28: 1500 mg via INTRAVENOUS
  Filled 2022-05-28: qty 300

## 2022-05-28 MED ORDER — LACTATED RINGERS IV BOLUS (SEPSIS)
1000.0000 mL | Freq: Once | INTRAVENOUS | Status: AC
  Administered 2022-05-28: 1000 mL via INTRAVENOUS

## 2022-05-28 MED ORDER — AMLODIPINE BESYLATE 5 MG PO TABS
5.0000 mg | ORAL_TABLET | Freq: Two times a day (BID) | ORAL | Status: DC
  Administered 2022-05-28 – 2022-06-08 (×21): 5 mg via ORAL
  Filled 2022-05-28 (×22): qty 1

## 2022-05-28 MED ORDER — METRONIDAZOLE 500 MG/100ML IV SOLN
500.0000 mg | Freq: Two times a day (BID) | INTRAVENOUS | Status: DC
  Administered 2022-05-28 – 2022-05-29 (×3): 500 mg via INTRAVENOUS
  Filled 2022-05-28 (×3): qty 100

## 2022-05-28 MED ORDER — TRIHEXYPHENIDYL HCL 2 MG PO TABS
2.0000 mg | ORAL_TABLET | Freq: Three times a day (TID) | ORAL | Status: DC
  Administered 2022-05-28 – 2022-06-08 (×33): 2 mg via ORAL
  Filled 2022-05-28 (×38): qty 1

## 2022-05-28 NOTE — H&P (Addendum)
History and Physical    Patient: Kristin Gilmore QMG:[id number] DOB: Feb 01, 1951 DOA: 05/27/2022 DOS: the patient was seen and examined on 05/28/2022 PCP: [facility] [place]  Patient coming from: Home  Chief Complaint:  Chief Complaint  Patient presents with   Sciatica   HPI: Kristin Gilmore is a 71 y.o. female with medical history significant of hypertension, dyslipidemia, prediabetes, anxiety, depression who presents with complaints of worsening back pain.  Patient reports having severe pain in her lower back with radiation down the left leg more so than into the right.  Denies any recent injury or trauma to onset symptoms.  She is being followed by Dr. Louanne Skye and has issues with chronic pain in this area.  They were trying to arrange for her to have an outpatient nerve block, but have been waiting on insurance approval.  She reports that she last had surgery of her lumbar spine back in January of this year.  She denies having any significant fevers, chills, saddle anesthesia, or  incontinence,.  Patient reports that she has been without her home medications as she spent 24 hours in the emergency department.  In the emergency department patient was noted to be febrile up to 101.4 F with tachycardia and tachypnea.  Blood pressures noted to be elevated up to 175/64 , and O2 saturations currently maintained on room air.  Labs from 8/28 significant for WBC 12.7, potassium 5.3, BUN 42, and creatinine 1.07.  Chest x-ray showed no acute abnormality. Urinalysis did not show signs of infection.  Influenza and COVID-19 screening were negative.  Patient has been given 1 L lactated Ringer's, ketorolac, oxycodone, and Dilaudid without improvement in symptoms.  No blood cultures or further work-up had been done for the fever.  Review of Systems: As mentioned in the history of present illness. All other systems reviewed and are negative. 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   Pneumonia    Pre-diabetes    "i was told i was pre-diabetic a while ago"   Tremor 04/13/2014   Past Surgical History:  Procedure Laterality Date   CHOLECYSTECTOMY     COLONOSCOPY W/ POLYPECTOMY     LUMBAR LAMINECTOMY  07/2020   3 level decompression - [place], [place]   LUMBAR LAMINECTOMY/DECOMPRESSION MICRODISCECTOMY N/A 12/19/2017   Procedure: LAMINECTOMY LUMBAR TWO- LUMBAR THREE, LUMBAR THREE- LUMBAR FOUR, LUMBAR FOUR- LUMBAR FIVE ;  Surgeon: Consuella Lose, MD;  Location: [facility];  Service: Neurosurgery;  Laterality: N/A;   NASAL SINUS SURGERY     TONSILLECTOMY     TOTAL KNEE ARTHROPLASTY Right 06/26/2021   Procedure: RIGHT TOTAL KNEE ARTHROPLASTY;  Surgeon: Leandrew Koyanagi, MD;  Location: [facility];  Service: Orthopedics;  Laterality: Right;   WISDOM TOOTH EXTRACTION     Social History:  reports that she has never smoked. She has never used smokeless tobacco. She reports current alcohol use. She reports that she does not use drugs.  Allergies  Allergen Reactions   Levofloxacin Other (See Comments)    Other reaction(s): Dizziness (intolerance)   Amoxicillin Hives    UNSPECIFIED REACTION  Has patient had a PCN reaction causing immediate rash, facial/tongue/throat swelling, SOB or lightheadedness with hypotension: Unknown Has patient  had a PCN reaction causing severe rash involving mucus membranes or skin necrosis: Unknown Has patient had a PCN reaction that required hospitalization: Unknown Has patient had a PCN reaction occurring within the last 10 years: No If all of the above answers are "NO", then may proceed with Cephalosporin use.    Clarithromycin Hives, Other (See Comments) and Rash   Atropine Hives    Family  History  Problem Relation Age of Onset   Cancer Father        stomach cancer   Migraines Mother     Prior to Admission medications   Medication Sig Start Date End Date Taking? Authorizing Provider  Allantoin 0.5 % GEL Apply 1 application topically 2 (two) times daily.    [provider]  ALPRAZolam Duanne Moron) 0.25 MG tablet Take 1 tablet (0.25 mg total) by mouth 2 (two) times daily as needed for anxiety. 11/02/21   Jessy Oto, MD  amitriptyline (ELAVIL) 10 MG tablet Take 3 tablets (30 mg total) by mouth at bedtime. 09/04/21   Melvenia Beam, MD  amLODipine (NORVASC) 5 MG tablet Take 5 mg by mouth 2 (two) times daily. 07/27/18   [provider]  aspirin EC 81 MG tablet Take 1 tablet (81 mg total) by mouth 2 (two) times daily. Patient taking differently: Take 81 mg by mouth daily with breakfast. 06/26/21   Aundra Dubin, PA-C  Biotin 5000 MCG CAPS Take 5,000 mcg by mouth every morning.    [provider]  bisacodyl (DULCOLAX) 5 MG EC tablet Take 5-15 mg by mouth daily as needed (constipation).    [provider]  Calcium Citrate-Vitamin D (CALCIUM CITRATE + D PO) Take 1-2 capsules by mouth See admin instructions. Calcium 200 mg, vitamin D3 2.5 mcg - 100 units - take 1 tablet by mouth with breakfast and 2 tablets with supper    [provider]  carvedilol (COREG) 25 MG tablet Take 12.5 mg by mouth 2 (two) times daily with a meal. 07/14/19   [provider]  Cholecalciferol (VITAMIN D3) 2000 UNITS TABS Take 4,000 Units by mouth daily with supper.    [provider]  Coenzyme Q10 (COQ10) 100 MG CAPS Take 100 mg by mouth daily with supper.    [provider]  docusate sodium (COLACE) 100 MG capsule Take 300 mg by mouth at bedtime as needed (constipation).    [provider]  ferrous sulfate 325 (65 FE) MG EC tablet Take 325 mg by mouth See admin instructions. Take one tablet (325) mg) by mouth every afternoon on an  empty stomach 10/05/19   [provider]  furosemide (LASIX) 20 MG tablet Take 1 tablet (20 mg total) by mouth daily. 11/29/21   Prosperi, Christian H, PA-C  glimepiride (AMARYL) 2 MG tablet Take 1 mg by mouth daily before breakfast. 10/19/19   [provider]  Glucosamine-Chondroitin 500-400 MG CAPS Take 1-2 capsules by mouth See admin instructions. Take one capsule by mouth every morning, and two capsules at night    [provider]  HYDROcodone-acetaminophen (NORCO) 5-325 MG tablet Take 1 tablet by mouth every 6 (six) hours as needed for moderate pain. 05/17/22   Newt Minion, MD  HYDROmorphone (DILAUDID) 2 MG tablet Take 1 tablet (2 mg total) by mouth every 4 (four) hours as needed for severe pain. 05/23/22   Jessy Oto, MD  ibuprofen (ADVIL) 200 MG tablet Take 400 mg by mouth daily as needed for headache.  [provider]  Lactobacillus (LACTINEX PO) Take 1 tablet by mouth daily.    [provider]  loperamide (IMODIUM) 2 MG capsule Take 2 mg by mouth 4 (four) times daily as needed for diarrhea or loose stools.    [provider]  MAGNESIUM CITRATE PO Take 150 mg by mouth at bedtime.    [provider]  Magnesium Oxide 500 MG TABS Take 500 mg by mouth at bedtime.    [provider]  Menthol, Topical Analgesic, (BIOFREEZE) 4 % GEL Apply 1 application topically 4 (four) times daily as needed (pain).    [provider]  methocarbamol (ROBAXIN) 500 MG tablet Take 1 tablet (500 mg total) by mouth every 8 (eight) hours as needed for muscle spasms. 11/02/21   Jessy Oto, MD  mupirocin ointment (BACTROBAN) 2 % Place 1 application into the nose 3 (three) times daily. 06/16/21   [provider]  Omega-3 Fatty Acids (FISH OIL) 1000 MG CAPS Take 1,000 mg by mouth 2 (two) times daily.    [provider]  OVER THE COUNTER MEDICATION Take 1 capsule by mouth 2 (two) times daily. [place] brain blood  vessel relaxation (butterburr root extract)    [provider]  OVER THE COUNTER MEDICATION Apply 1 application topically daily. Leg & back pain relief cream    [provider]  oxyCODONE (OXYCONTIN) 20 mg 12 hr tablet Take 1 tablet (20 mg total) by mouth every 12 (twelve) hours. 05/16/22   Newt Minion, MD  pantoprazole (PROTONIX) 40 MG tablet Take 40 mg by mouth 2 (two) times daily.    [provider]  Probiotic Product (PROBIOTIC PO) Take 1 capsule by mouth daily with breakfast. SUPREMA DOPHILUS 5 BILLION CFU    [provider]  QUEtiapine (SEROQUEL) 300 MG tablet TAKE 0.5 TABLETS (150 MG TOTAL) BY MOUTH AT BEDTIME. Patient taking differently: Take 150 mg by mouth at bedtime. 09/12/21   Cottle, Billey Co., MD  Red Yeast Rice 600 MG CAPS Take 1,200 mg by mouth 2 (two) times daily.    [provider]  Simethicone 180 MG CAPS Take 180 mg by mouth daily as needed (gas/bloating).    [provider]  sodium chloride (OCEAN) 0.65 % SOLN nasal spray Place 1 spray into both nostrils 3 (three) times daily.    [provider]  sulfamethoxazole-trimethoprim (BACTRIM DS) 800-160 MG tablet Take 1 tablet by mouth 2 (two) times daily. 02/14/22   Leandrew Koyanagi, MD  SUMAtriptan (IMITREX) 100 MG tablet Take 1 tablet (100 mg total) by mouth 2 (two) times daily as needed for up to 28 days for migraine. May repeat in 2 hours if headache persists or recurs. 02/28/22 03/28/22  Garvin Fila, MD  telmisartan (MICARDIS) 80 MG tablet Take 80 mg by mouth at bedtime.    [provider]  tolterodine (DETROL LA) 4 MG 24 hr capsule Take 4 mg by mouth daily with breakfast. 02/20/21   [provider]  trihexyphenidyl (ARTANE) 2 MG tablet Take 1 tablet (2 mg total) by mouth 3 (three) times daily with meals. 01/31/22   Penumalli, Earlean Polka, MD  Turmeric 500 MG CAPS Take 500 mg by mouth 2 (two) times daily with a meal.    [provider]   venlafaxine XR (EFFEXOR-XR) 75 MG 24 hr capsule TAKE 1 CAPSULE BY MOUTH DAILY WITH BREAKFAST. Patient taking differently: 75 mg daily with breakfast. 08/02/21   Margette Fast  K, MD    Physical Exam: Vitals:   05/27/22 2239 05/28/22 0020 05/28/22 0200 05/28/22 0443  BP: (!) 175/64  (!) 156/63 (!) 171/137  Pulse: 92  90 (!) 104  Resp: '20  20 20  ' Temp:  98.5 F (36.9 C)  98.7 F (37.1 C)  TempSrc:  Oral  Oral  SpO2: 94%  100% 92%  Weight:      Height:       Constitutional: Female who appears to be in some distress Eyes: PERRL, lids and conjunctivae normal ENMT: Mucous membranes are dry Neck: normal, supple, no masses, no thyromegaly Respiratory: clear to auscultation bilaterally, no wheezing, no crackles.  Cardiovascular: Regular rate and rhythm, no murmurs / rubs / gallops. No extremity edema.  Abdomen: no tenderness, no masses palpated.  Bowel sounds positive.  Musculoskeletal: no clubbing / cyanosis.  Swelling noted of the right elbow. Skin: no rashes, lesions, ulcers. No induration Neurologic: CN 2-12 grossly intact.  Tremor present. Psychiatric: Normal judgment and insight. Alert and oriented x 3.  Anxious mood.   Data Reviewed:   Sinus rhythm at 79 bpm.  Reviewed labs imaging and pertinent records as noted above in the HPI.  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  Patient presented worsening lower back pain with radiation mostly down the left hip and leg.  Postop from L3 -4 and L4 -L5 TLIF.  Uncontrolled with home pain regimen which includes Dilaudid 2 mg p.o.  Every 4 hours as needed for pain.  Dr. Louanne Skye orthopedics has been consulted and were hopeful to get the patient a nerve block in the inpatient setting.  Orders were placed for nerve block, but later discontinued. -Admit to a telemetry bed -Continue pain regimen of Dilaudid -Appreciate Dr. Louanne Skye orthopedics consultative services, we will follow-up for any further  recommendation  SIRS Patient was noted to befebrile up to 101.4 F with tachycardia, tachypnea, and leukocytosis of 12.7.  Chest x-ray and urinalysis without signs of infection.  Unclear source at this time, but orthopedics question right olecranon bursitis. -Sepsis order set initiated -Check blood cultures -Check ESR, CRP, lactic acid, PT/INR, and APTT -Start empiric antibiotics of vancomycin, metronidazole, and cefepime  Hyperkalemia Acute.  Initial potassium 5.3.  Patient has been given 1 L of lactated Ringer's. -Recheck potassium(4.2)  Dehydration Patient had not been able to eat or drink since coming into the hospital. Noted to have elevated BUN to creatinine ratio -Continue IV fluids  Essential hypertension Blood pressures have been elevated up to 175/64.  Home blood pressure regimen appears to include amlodipine 5 mg twice daily, Coreg 12.5 mg twice daily with meals, furosemide 20 mg daily, and telmisartan 80 mg nightly. -Held p.o. furosemide -Continue patient on blood pressure regimen with pharmacy substitution of telmisartan  Anxiety and depression -Resume home meds once med reconciliation completed  Prediabetes Last hemoglobin A1c 5.8 on 06/22/2021.  Home medication regimen appears to include glipizide.  Parkinson's disease Home medication regimen includes Artane 2 mg 3 times daily. -Resume home regimen  DVT prophylaxis: Lovenox Advance Care Planning:   Code Status: Full Code  Consults: Orthopedics  Family Communication:   Severity of Illness: The appropriate patient status for this patient is INPATIENT. Inpatient status is judged to be reasonable and necessary in order to provide the required intensity of service to ensure the patient's safety. The patient's presenting symptoms, physical exam findings, and initial radiographic and laboratory data in the context of their chronic comorbidities  is felt to place them at high risk for further clinical deterioration.  Furthermore, it is not anticipated that the patient will be medically stable for discharge from the hospital within 2 midnights of admission.   * I certify that at the point of admission it is my clinical judgment that the patient will require inpatient hospital care spanning beyond 2 midnights from the point of admission due to high intensity of service, high risk for further deterioration and high frequency of surveillance required.*  Author: Norval Morton, MD 05/28/2022 7:39 AM  For on call review [email or web address].

## 2022-05-28 NOTE — Progress Notes (Addendum)
Patient refused to go to MRI, she keep insisting that the doctor told her that she need injection to knock her out before they do the procedure, explained that this is scan of the back and not the procedure. Informed MD Fuller Plan that the patient refused, he said Dr. Louanne Skye will contact anesthesia to have it done tomorrow.

## 2022-05-28 NOTE — Progress Notes (Signed)
Patient stated that she had pain medications in her purse. I educated patient on the importance of not taking any home meds because they could interfere with the medications that she is getting from the hospital. Home medication removed from patients reach and will be sent down to pharmacy until discharge.

## 2022-05-28 NOTE — Progress Notes (Signed)
Elink following code sepsis °

## 2022-05-28 NOTE — Progress Notes (Signed)
MD made aware that bolus finished and that  vitals are in per order    05/28/22 1148  Vitals  Temp 98.7 F (37.1 C)  Temp Source Oral  BP 139/63  BP Location Right Arm  BP Method Automatic  Patient Position (if appropriate) Lying  Pulse Rate 84  Pulse Rate Source Dinamap  Resp 20  Level of Consciousness  Level of Consciousness Alert  MEWS COLOR  MEWS Score Color Green  Oxygen Therapy  SpO2 92 %  O2 Device Room Air  MEWS Score  MEWS Temp 0  MEWS Systolic 0  MEWS Pulse 0  MEWS RR 0  MEWS LOC 0  MEWS Score 0

## 2022-05-28 NOTE — Progress Notes (Signed)
Mobility Specialist Progress Note:   05/28/22 0930  Mobility  Activity Transferred to/from BSC;Transferred from bed to chair  Level of Assistance Moderate assist, patient does 50-74% (+2)  Assistive Device Front wheel walker;BSC  Distance Ambulated (ft) 3 ft  Activity Response Tolerated poorly  $Mobility charge 1 Mobility   Pt adamantly refusing use of purewick, stating she can get on BSC. Pt screaming out in pain multiple times throughout session. Required modA+2 to stand and pivot to BSC to void, then to chair with use of RW. Pt left in chair with chair alarm on, all needs met.     Acute Rehab Secure Chat or Office Phone: [phone]  

## 2022-05-28 NOTE — Consult Note (Signed)
Reason for Consult:Left hip and leg pain,right elbow swelling and pain, low back pain.  Referring Physician: Dr. Fuller Plan, MD. Consulting Physician:Collyn Ribas [facility]  Orthopedic Diagnosis:1)Septic right olecranon bursitis vs inflamatory bursitis 2)Left hip pain, mid buttock, could be ischial bursitis vs referred pain from lumbar spine. Her pain previously was right sided and severe. 3)Bilateral leg weakness and pain diffuse, EMG/NCV with bilateral L5 and S1 radiculopathy, right greater than left. Right L5 Selective nerve root block was done 05/06/2022 by  Dr. Jeralyn Ruths, was confirmed by fluoro but did not provide any relief of pain per Mrs. Guagliardo. 4)Parkinson's disease, she is in constant motion and shows worsening  Tremor. 5) Right posterior distal humerus swelling, no warmth but swelling is present over the posterior right distal triceps area.  Kristin Gilmore is an 71 y.o. female.Admitted via ER at [facility] for severe pain. 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 Underwent right knee replacement by Dr.Xu in 05/2021. Had severe right knee pain post op and persistent  pain into the right anterior thigh and right knee. Post op radiographs of the knee unremarkable. She had selective nerve root blocks and eventuallly underwent L3-4 and L4-5 TLIFs interbody fusion surgery 10/30/2021. Post operatively she has had recurrent pain but into the right buttock and posterior lateral thigh, complaints of leg weakness and pain with standing and walking. 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 She has not had imaging of her nerves by myelogram or MRI since her surgery 1/31?2023.  She is complaining of pain in the back and both legs today, lying on her left side, she complains of left buttock pain  that is severe. This is the leg Opposite her previous right leg pain. Pain is worse when she lies on her back.   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   Pneumonia    Pre-diabetes    "i was told i was pre-diabetic a while ago"   Tremor 04/13/2014    Past Surgical History:  Procedure Laterality Date   CHOLECYSTECTOMY     COLONOSCOPY W/ POLYPECTOMY     LUMBAR LAMINECTOMY  07/2020   3 level decompression - [place], [place]   LUMBAR LAMINECTOMY/DECOMPRESSION MICRODISCECTOMY N/A 12/19/2017   Procedure: LAMINECTOMY LUMBAR TWO- LUMBAR THREE, LUMBAR THREE- LUMBAR FOUR, LUMBAR FOUR- LUMBAR FIVE ;  Surgeon: Consuella Lose, MD;  Location: [place];  Service: Neurosurgery;  Laterality: N/A;   NASAL SINUS SURGERY     TONSILLECTOMY     TOTAL KNEE ARTHROPLASTY Right 06/26/2021   Procedure: RIGHT TOTAL KNEE ARTHROPLASTY;  Surgeon: Leandrew Koyanagi, MD;  Location: [place];  Service: Orthopedics;  Laterality: Right;   WISDOM TOOTH EXTRACTION      Family History  Problem Relation Age of Onset   Cancer Father        stomach cancer   Migraines Mother     Social History:  reports that she has never smoked. She has never used smokeless tobacco. She reports current alcohol use. She reports that she does not  use drugs.  Allergies:  Allergies  Allergen Reactions   Levofloxacin Other (See Comments)    Other reaction(s): Dizziness (intolerance)   Amoxicillin Hives    UNSPECIFIED REACTION  Has patient had a PCN reaction causing immediate rash, facial/tongue/throat swelling, SOB or lightheadedness with hypotension: Unknown Has patient had a PCN reaction causing severe rash involving mucus membranes or skin necrosis: Unknown Has patient had a  PCN reaction that required hospitalization: Unknown Has patient had a PCN reaction occurring within the last 10 years: No If all of the above answers are "NO", then may proceed with Cephalosporin use.    Clarithromycin Hives, Other (See Comments) and Rash   Atropine Hives    Medications: Prior to Admission:  Facility-Administered Medications Prior to Admission  Medication Dose Route Frequency Provider Last Rate Last Admin   methylPREDNISolone acetate (DEPO-MEDROL) injection 80 mg  80 mg Intramuscular Once Lanae Crumbly, PA-C       Medications Prior to Admission  Medication Sig Dispense Refill Last Dose   ALPRAZolam (XANAX) 0.25 MG tablet Take 1 tablet (0.25 mg total) by mouth 2 (two) times daily as needed for anxiety. 30 tablet 0 unknown   HYDROmorphone (DILAUDID) 2 MG tablet Take 1 tablet (2 mg total) by mouth every 4 (four) hours as needed for severe pain. 20 tablet 0 05/28/2022   Allantoin 0.5 % GEL Apply 1 application topically 2 (two) times daily.      amitriptyline (ELAVIL) 10 MG tablet Take 3 tablets (30 mg total) by mouth at bedtime. 270 tablet 1    amLODipine (NORVASC) 5 MG tablet Take 5 mg by mouth 2 (two) times daily.      aspirin EC 81 MG tablet Take 1 tablet (81 mg total) by mouth 2 (two) times daily. (Patient taking differently: Take 81 mg by mouth daily with breakfast.) 84 tablet 0    Biotin 5000 MCG CAPS Take 5,000 mcg by mouth every morning.      bisacodyl (DULCOLAX) 5 MG EC tablet Take 5-15 mg by mouth daily as needed (constipation).      Calcium Citrate-Vitamin D (CALCIUM CITRATE + D PO) Take 1-2 capsules by mouth See admin instructions. Calcium 200 mg, vitamin D3 2.5 mcg - 100 units - take 1 tablet by mouth with breakfast and 2 tablets with supper      carvedilol (COREG) 12.5 MG tablet Take 12.5 mg by mouth 2 (two) times daily.      carvedilol (COREG) 25 MG tablet Take 12.5 mg by mouth 2 (two) times daily with a meal.      Cholecalciferol (VITAMIN D3) 2000 UNITS TABS  Take 4,000 Units by mouth daily with supper.      Coenzyme Q10 (COQ10) 100 MG CAPS Take 100 mg by mouth daily with supper.      docusate sodium (COLACE) 100 MG capsule Take 300 mg by mouth at bedtime as needed (constipation).      ferrous sulfate 325 (65 FE) MG EC tablet Take 325 mg by mouth See admin instructions. Take one tablet (325) mg) by mouth every afternoon on an empty stomach      furosemide (LASIX) 20 MG tablet Take 1 tablet (20 mg total) by mouth daily. 10 tablet 0    glimepiride (AMARYL) 2 MG tablet Take 1 mg by mouth daily before breakfast.      Glucosamine-Chondroitin 500-400 MG CAPS Take 1-2 capsules by mouth See admin instructions. Take one capsule by mouth every morning, and two capsules at night  HYDROcodone-acetaminophen (NORCO) 5-325 MG tablet Take 1 tablet by mouth every 6 (six) hours as needed for moderate pain. 30 tablet 0    ibuprofen (ADVIL) 200 MG tablet Take 400 mg by mouth daily as needed for headache.      Lactobacillus (LACTINEX PO) Take 1 tablet by mouth daily.      loperamide (IMODIUM) 2 MG capsule Take 2 mg by mouth 4 (four) times daily as needed for diarrhea or loose stools.      MAGNESIUM CITRATE PO Take 150 mg by mouth at bedtime.      Magnesium Oxide 500 MG TABS Take 500 mg by mouth at bedtime.      Menthol, Topical Analgesic, (BIOFREEZE) 4 % GEL Apply 1 application topically 4 (four) times daily as needed (pain).      methocarbamol (ROBAXIN) 500 MG tablet Take 1 tablet (500 mg total) by mouth every 8 (eight) hours as needed for muscle spasms. 30 tablet 1    mupirocin ointment (BACTROBAN) 2 % Place 1 application into the nose 3 (three) times daily.      Omega-3 Fatty Acids (FISH OIL) 1000 MG CAPS Take 1,000 mg by mouth 2 (two) times daily.      OVER THE COUNTER MEDICATION Take 1 capsule by mouth 2 (two) times daily. [place] brain blood vessel relaxation (butterburr root extract)      OVER THE COUNTER MEDICATION Apply 1 application topically daily.  Leg & back pain relief cream      oxyCODONE (OXYCONTIN) 20 mg 12 hr tablet Take 1 tablet (20 mg total) by mouth every 12 (twelve) hours. 14 tablet 0    pantoprazole (PROTONIX) 40 MG tablet Take 40 mg by mouth 2 (two) times daily.      potassium chloride (KLOR-CON) 10 MEQ tablet Take 10 mEq by mouth daily.      Probiotic Product (PROBIOTIC PO) Take 1 capsule by mouth daily with breakfast. SUPREMA DOPHILUS 5 BILLION CFU      QUEtiapine (SEROQUEL) 300 MG tablet TAKE 0.5 TABLETS (150 MG TOTAL) BY MOUTH AT BEDTIME. (Patient taking differently: Take 150 mg by mouth at bedtime.) 45 tablet 1    Red Yeast Rice 600 MG CAPS Take 1,200 mg by mouth 2 (two) times daily.      Simethicone 180 MG CAPS Take 180 mg by mouth daily as needed (gas/bloating).      sodium chloride (OCEAN) 0.65 % SOLN nasal spray Place 1 spray into both nostrils 3 (three) times daily.      sulfamethoxazole-trimethoprim (BACTRIM DS) 800-160 MG tablet Take 1 tablet by mouth 2 (two) times daily. 20 tablet 0    SUMAtriptan (IMITREX) 100 MG tablet Take 1 tablet (100 mg total) by mouth 2 (two) times daily as needed for up to 28 days for migraine. May repeat in 2 hours if headache persists or recurs. 10 tablet 0    telmisartan (MICARDIS) 40 MG tablet Take 40 mg by mouth daily.      telmisartan (MICARDIS) 80 MG tablet Take 80 mg by mouth at bedtime.      tolterodine (DETROL LA) 4 MG 24 hr capsule Take 4 mg by mouth daily with breakfast.      trihexyphenidyl (ARTANE) 2 MG tablet Take 1 tablet (2 mg total) by mouth 3 (three) times daily with meals. 270 tablet 0    Turmeric 500 MG CAPS Take 500 mg by mouth 2 (two) times daily with a meal.      venlafaxine XR (EFFEXOR-XR)  75 MG 24 hr capsule TAKE 1 CAPSULE BY MOUTH DAILY WITH BREAKFAST. (Patient taking differently: 75 mg daily with breakfast.) 90 capsule 0    Scheduled:  amLODipine  5 mg Oral BID   carvedilol  12.5 mg Oral BID WC   enoxaparin (LOVENOX) injection  40 mg Subcutaneous Q24H    irbesartan  300 mg Oral Daily   sodium chloride flush  3 mL Intravenous Q12H   trihexyphenidyl  2 mg Oral TID WC   Continuous:  sodium chloride 125 mL/hr at 05/27/22 1049   ceFEPime (MAXIPIME) IV     metronidazole 500 mg (05/28/22 1123)   [START ON 05/29/2022] vancomycin     EHM:[id number] **OR** acetaminophen, albuterol, ALPRAZolam, HYDROmorphone Anti-infectives (From admission, onward)    Start     Dose/Rate Route Frequency Ordered Stop   05/29/22 1000  vancomycin (VANCOREADY) IVPB 1250 mg/250 mL        1,250 mg 166.7 mL/hr over 90 Minutes Intravenous Every 24 hours 05/28/22 1308     05/28/22 2200  ceFEPIme (MAXIPIME) 2 g in sodium chloride 0.9 % 100 mL IVPB        2 g 200 mL/hr over 30 Minutes Intravenous Every 12 hours 05/28/22 1308     05/28/22 1030  ceFEPIme (MAXIPIME) 2 g in sodium chloride 0.9 % 100 mL IVPB        2 g 200 mL/hr over 30 Minutes Intravenous  Once 05/28/22 0936 05/28/22 1117   05/28/22 1030  metroNIDAZOLE (FLAGYL) IVPB 500 mg        500 mg 100 mL/hr over 60 Minutes Intravenous Every 12 hours 05/28/22 0936 06/04/22 1029   05/28/22 1030  vancomycin (VANCOREADY) IVPB 1500 mg/300 mL        1,500 mg 150 mL/hr over 120 Minutes Intravenous  Once 05/28/22 0936 05/28/22 1347       Results for orders placed or performed during the hospital encounter of 05/27/22 (from the past 48 hour(s))  CBC with Differential/Platelet     Status: Abnormal   Collection Time: 05/27/22 10:46 AM  Result Value Ref Range   WBC 12.7 (H) 4.0 - 10.5 K/uL   RBC 4.06 3.87 - 5.11 MIL/uL   Hemoglobin 12.6 12.0 - 15.0 g/dL   HCT 38.3 36.0 - 46.0 %   MCV 94.3 80.0 - 100.0 fL   MCH 31.0 26.0 - 34.0 pg   MCHC 32.9 30.0 - 36.0 g/dL   RDW 13.2 11.5 - 15.5 %   Platelets 227 150 - 400 K/uL   nRBC 0.0 0.0 - 0.2 %   Neutrophils Relative % 81 %   Neutro Abs 10.3 (H) 1.7 - 7.7 K/uL   Lymphocytes Relative 8 %   Lymphs Abs 1.0 0.7 - 4.0 K/uL   Monocytes Relative 10 %   Monocytes Absolute 1.3  (H) 0.1 - 1.0 K/uL   Eosinophils Relative 1 %   Eosinophils Absolute 0.1 0.0 - 0.5 K/uL   Basophils Relative 0 %   Basophils Absolute 0.0 0.0 - 0.1 K/uL   Immature Granulocytes 0 %   Abs Immature Granulocytes 0.03 0.00 - 0.07 K/uL    Comment: Performed at [facility], [address] [place], [place] [address]  Basic metabolic panel     Status: Abnormal   Collection Time: 05/27/22 10:46 AM  Result Value Ref Range   Sodium 136 135 - 145 mmol/L   Potassium 5.3 (H) 3.5 - 5.1 mmol/L   Chloride 99 98 - 111 mmol/L  CO2 28 22 - 32 mmol/L   Glucose, Bld 118 (H) 70 - 99 mg/dL    Comment: Glucose reference range applies only to samples taken after fasting for at least 8 hours.   BUN 42 (H) 8 - 23 mg/dL   Creatinine, Ser 1.07 (H) 0.44 - 1.00 mg/dL   Calcium 9.1 8.9 - 10.3 mg/dL   GFR, Estimated 55 (L) >60 mL/min    Comment: (NOTE) Calculated using the CKD-EPI Creatinine Equation (2021)    Anion gap 9 5 - 15    Comment: Performed at [facility], [address] [place], [place] [address]  Urinalysis, Routine w reflex microscopic Urine, Clean Catch     Status: Abnormal   Collection Time: 05/27/22  8:10 PM  Result Value Ref Range   Color, Urine YELLOW YELLOW   APPearance HAZY (A) CLEAR   Specific Gravity, Urine 1.028 1.005 - 1.030   pH 5.5 5.0 - 8.0   Glucose, UA NEGATIVE NEGATIVE mg/dL   Hgb urine dipstick NEGATIVE NEGATIVE   Bilirubin Urine NEGATIVE NEGATIVE   Ketones, ur NEGATIVE NEGATIVE mg/dL   Protein, ur TRACE (A) NEGATIVE mg/dL   Nitrite NEGATIVE NEGATIVE   Leukocytes,Ua NEGATIVE NEGATIVE   RBC / HPF 0-5 0 - 5 RBC/hpf   WBC, UA 0-5 0 - 5 WBC/hpf    Comment: Performed at [facility], [address], [place], [place] [address]  Resp Panel by RT-PCR (Flu A&B, Covid) Urine, Clean Catch     Status: None   Collection Time: 05/27/22  8:11 PM   Specimen: Urine, Clean Catch; Nasal Swab  Result Value Ref Range   SARS  Coronavirus 2 by RT PCR NEGATIVE NEGATIVE    Comment: (NOTE) SARS-CoV-2 target nucleic acids are NOT DETECTED.  The SARS-CoV-2 RNA is generally detectable in upper respiratory specimens during the acute phase of infection. The lowest concentration of SARS-CoV-2 viral copies this assay can detect is 138 copies/mL. A negative result does not preclude SARS-Cov-2 infection and should not be used as the sole basis for treatment or other patient management decisions. A negative result may occur with  improper specimen collection/handling, submission of specimen other than nasopharyngeal swab, presence of viral mutation(s) within the areas targeted by this assay, and inadequate number of viral copies(<138 copies/mL). A negative result must be combined with clinical observations, patient history, and epidemiological information. The expected result is Negative.  Fact Sheet for Patients:  EntrepreneurPulse.com.au  Fact Sheet for Healthcare Providers:  IncredibleEmployment.be  This test is no t yet approved or cleared by the [place] FDA and  has been authorized for detection and/or diagnosis of SARS-CoV-2 by FDA under an Emergency Use Authorization (EUA). This EUA will remain  in effect (meaning this test can be used) for the duration of the COVID-19 declaration under Section 564(b)(1) of the Act, 21 U.S.C.section 360bbb-3(b)(1), unless the authorization is terminated  or revoked sooner.       Influenza A by PCR NEGATIVE NEGATIVE   Influenza B by PCR NEGATIVE NEGATIVE    Comment: (NOTE) The Xpert Xpress SARS-CoV-2/FLU/RSV plus assay is intended as an aid in the diagnosis of influenza from Nasopharyngeal swab specimens and should not be used as a sole basis for treatment. Nasal washings and aspirates are unacceptable for Xpert Xpress SARS-CoV-2/FLU/RSV testing.  Fact Sheet for Patients: EntrepreneurPulse.com.au  Fact Sheet  for Healthcare Providers: IncredibleEmployment.be  This test is not yet approved or cleared by the [place] and has been authorized for  detection and/or diagnosis of SARS-CoV-2 by FDA under an Emergency Use Authorization (EUA). This EUA will remain in effect (meaning this test can be used) for the duration of the COVID-19 declaration under Section 564(b)(1) of the Act, 21 U.S.C. section 360bbb-3(b)(1), unless the authorization is terminated or revoked.  Performed at [facility], [address] [place], [place] [address]   CBC with Differential     Status: Abnormal   Collection Time: 05/28/22 10:38 AM  Result Value Ref Range   WBC 16.8 (H) 4.0 - 10.5 K/uL   RBC 3.84 (L) 3.87 - 5.11 MIL/uL   Hemoglobin 12.1 12.0 - 15.0 g/dL   HCT 35.5 (L) 36.0 - 46.0 %   MCV 92.4 80.0 - 100.0 fL   MCH 31.5 26.0 - 34.0 pg   MCHC 34.1 30.0 - 36.0 g/dL   RDW 13.6 11.5 - 15.5 %   Platelets 219 150 - 400 K/uL   nRBC 0.0 0.0 - 0.2 %   Neutrophils Relative % 79 %   Neutro Abs 13.3 (H) 1.7 - 7.7 K/uL   Lymphocytes Relative 8 %   Lymphs Abs 1.3 0.7 - 4.0 K/uL   Monocytes Relative 12 %   Monocytes Absolute 1.9 (H) 0.1 - 1.0 K/uL   Eosinophils Relative 0 %   Eosinophils Absolute 0.1 0.0 - 0.5 K/uL   Basophils Relative 0 %   Basophils Absolute 0.1 0.0 - 0.1 K/uL   Immature Granulocytes 1 %   Abs Immature Granulocytes 0.15 (H) 0.00 - 0.07 K/uL    Comment: Performed at [facility], [address] [place], [place] [address]  Comprehensive metabolic panel     Status: Abnormal   Collection Time: 05/28/22 10:38 AM  Result Value Ref Range   Sodium 132 (L) 135 - 145 mmol/L   Potassium 4.2 3.5 - 5.1 mmol/L   Chloride 101 98 - 111 mmol/L   CO2 23 22 - 32 mmol/L   Glucose, Bld 130 (H) 70 - 99 mg/dL    Comment: Glucose reference range applies only to samples taken after fasting for at least 8 hours.   BUN 24 (H) 8 - 23 mg/dL   Creatinine, Ser 0.88 0.44  - 1.00 mg/dL   Calcium 8.5 (L) 8.9 - 10.3 mg/dL   Total Protein 6.3 (L) 6.5 - 8.1 g/dL   Albumin 3.3 (L) 3.5 - 5.0 g/dL   AST 26 15 - 41 U/L   ALT 28 0 - 44 U/L   Alkaline Phosphatase 75 38 - 126 U/L   Total Bilirubin 0.6 0.3 - 1.2 mg/dL   GFR, Estimated >60 >60 mL/min    Comment: (NOTE) Calculated using the CKD-EPI Creatinine Equation (2021)    Anion gap 8 5 - 15    Comment: Performed at [facility], [place] [address], [place], [place] [address]  Lactic acid, plasma     Status: None   Collection Time: 05/28/22 10:38 AM  Result Value Ref Range   Lactic Acid, Venous 1.0 0.5 - 1.9 mmol/L    Comment: Performed at [facility] [address], [place],  [address]  Protime-INR     Status: Abnormal   Collection Time: 05/28/22 10:38 AM  Result Value Ref Range   Prothrombin Time 15.3 (H) 11.4 - 15.2 seconds   INR 1.2 0.8 - 1.2    Comment: (NOTE) INR goal varies based on device and disease states. Performed at [facility], [place] [address]  [place], [place] [address]   APTT     Status: None   Collection Time: 05/28/22 10:38 AM  Result Value Ref Range   aPTT 33 24 - 36 seconds    Comment: Performed at [facility] [address] [place], [place] [address]  Lactic acid, plasma     Status: None   Collection Time: 05/28/22 12:31 PM  Result Value Ref Range   Lactic Acid, Venous 0.9 0.5 - 1.9 mmol/L    Comment: Performed at [facility] [address], [place], [place] [address]  Sedimentation rate     Status: Abnormal   Collection Time: 05/28/22  4:13 PM  Result Value Ref Range   Sed Rate 41 (H) 0 - 22 mm/hr    Comment: Performed at [facility] [address] [place], [place] [address]  C-reactive protein     Status: Abnormal   Collection Time: 05/28/22  4:13 PM  Result Value Ref Range   CRP 32.1 (H) <1.0 mg/dL    Comment: Performed at [facility] [address] [place], [place] [address]    DG Chest Portable 1 View  Result  Date: 05/27/2022 CLINICAL DATA:  Chest pain and fevers, initial encounter EXAM: PORTABLE CHEST 1 VIEW COMPARISON:  11/30/2018 FINDINGS: Cardiac shadow is stable. Lungs are well aerated bilaterally. No focal infiltrate or effusion is seen. Degenerative changes of the thoracic spine are noted. IMPRESSION: No active disease. Electronically Signed   By: Inez Catalina M.D.   On: 05/27/2022 20:32    ROS Blood pressure 139/63, pulse 84, temperature 98.7 F (37.1 C), temperature source Oral, resp. rate 20, height '5\' 7"'$  (1.702 m), weight 72.6 kg, SpO2 92 %. Physical Exam Awake, alert and oriented x 4. Complaints of severe pain but with sitting and talking she is able to joke and laugh. Vital signs are stable. Resting tremor is profound and is worse than that seen 1-2 months ago. There is erythrema and warmth right elbow with swelling that is diffuse over the right olecranon bursa. There is minimal fluctuance. ROM of the right elbow is full extension and full flexion. There is normal motor in both legs Pain with palpation over the right anterior knee.  EHL and foot DF strength is normal.  She is lying on the left side with her hips flexed up. No recent MRI or imaging studies.   Orthopaedic Exam: Resting tremor is profound and is worse than that seen 1-2 months ago. There is erythrema and warmth right elbow with swelling that is diffuse over the right olecranon bursa. There is minimal fluctuance. ROM of the right elbow is full extension and full flexion. There is normal motor in both legs Pain with palpation over the right anterior knee.  EHL and foot DF strength is normal.  She is lying on the left side with her hips flexed up. No recent MRI or imaging studies.   Assessment/Plan:7 months post op L3-4 and L4-5 right TLIFs, she has non focal pain and weakness. There is no recent imaging study other than CT scan and a  new MRI of the lumbar spine and left hip is recommended. SNR (selective nerve root  block is not warranted as she has had recent right L5 SNRB with no improvement in her pain. She had either a right elbow septic olecranon bursitis or an inflamatory bursitis, elevated WBC on admission, warmth, erythrema, tenderness and swelling suggest infection.  Right distal posterior triceps swelling, non tender.  Plan: Hold on any further selective nerve root blocks, she has had them and it did not help. Neurology eval to help with Parkinson treatment as her signs and symptoms seem to be much worse, her regular neurologist for this condition, Dr. Floyde Parkins has retired. MRI of the lumbar spine and left hip under general anesthesia due to  Worsening back buttock and leg compaints. She has had a non contrast CT scan but no imaging of the lumbar spine, this would be indicated as she is having severe complaints of ongoing back and leg pain 7 months post op right L3-4 and L4-5 fusions. Left hip pain. Right olecranon bursits, likely septic or infected bursitis, recommend starting ancef for staph coverage.  Check CRP and uric acid.  Kristin Gilmore 05/28/2022, 6:11 PM

## 2022-05-28 NOTE — ED Notes (Signed)
Patient is asleep in no acute distress breathing spontaneously to room air . Pain is controlled .

## 2022-05-28 NOTE — Progress Notes (Signed)
Home medications (Hydromorphone and acetaminophen) sent to pharmacy

## 2022-05-28 NOTE — ED Notes (Signed)
Tried calling 6 [address] multiple times with no success . Charge RN made aware . Patient picked up by [facility].

## 2022-05-28 NOTE — Progress Notes (Signed)
Pharmacy Antibiotic Note  Kristin Gilmore is a 71 y.o. female admitted on 05/27/2022 with worsening chronic lower back pain. Noted to be febrile in the ED upto 101.4 with associated tachycardia and tachypnea. Pharmacy has been consulted for vancomycin and cefepime dosing for sepsis of unknown source.  WBC 16.8, TM 101.4, UA not indicative of UTI  Plan: Vancomycin '1500mg'$  IV x1 in ED, followed by Vancomycin '1250mg'$  IV q24 hours (eAUC 468, Scr 0.88, VD 0.72) Cefepime 2g IV q12 hours for CrCl 30-60 mL/min Flagyl '500mg'$  IV q12 hours per MD F/u culture data, ability to narrow abx, renal function  Height: '5\' 7"'$  (170.2 cm) Weight: 72.6 kg (160 lb) IBW/kg (Calculated) : 61.6  Temp (24hrs), Avg:99.3 F (37.4 C), Min:98.5 F (36.9 C), Max:101.4 F (38.6 C)  Recent Labs  Lab 05/27/22 1046  WBC 12.7*  CREATININE 1.07*    Estimated Creatinine Clearance: 46.2 mL/min (A) (by C-G formula based on SCr of 1.07 mg/dL (H)).    Allergies  Allergen Reactions   Levofloxacin Other (See Comments)    Other reaction(s): Dizziness (intolerance)   Amoxicillin Hives    UNSPECIFIED REACTION  Has patient had a PCN reaction causing immediate rash, facial/tongue/throat swelling, SOB or lightheadedness with hypotension: Unknown Has patient had a PCN reaction causing severe rash involving mucus membranes or skin necrosis: Unknown Has patient had a PCN reaction that required hospitalization: Unknown Has patient had a PCN reaction occurring within the last 10 years: No If all of the above answers are "NO", then may proceed with Cephalosporin use.    Clarithromycin Hives, Other (See Comments) and Rash   Atropine Hives    Antimicrobials this admission: Vancomycin 8/29 >>  Cefepime 8/29 >>  Flagyl 8/29 >>  Dose adjustments this admission:  Microbiology results: 8/29 BCx:   Thank you for allowing pharmacy to be a part of this patient's care.  Dimple Nanas, PharmD, BCPS 05/28/2022 9:44 AM

## 2022-05-29 DIAGNOSIS — G20 Parkinson's disease: Secondary | ICD-10-CM | POA: Diagnosis not present

## 2022-05-29 DIAGNOSIS — B9561 Methicillin susceptible Staphylococcus aureus infection as the cause of diseases classified elsewhere: Secondary | ICD-10-CM

## 2022-05-29 DIAGNOSIS — M549 Dorsalgia, unspecified: Secondary | ICD-10-CM | POA: Diagnosis not present

## 2022-05-29 DIAGNOSIS — R7881 Bacteremia: Secondary | ICD-10-CM | POA: Diagnosis not present

## 2022-05-29 DIAGNOSIS — R6510 Systemic inflammatory response syndrome (SIRS) of non-infectious origin without acute organ dysfunction: Secondary | ICD-10-CM

## 2022-05-29 LAB — BLOOD CULTURE ID PANEL (REFLEXED) - BCID2

## 2022-05-29 LAB — CBC
HCT: 31.1 % — ABNORMAL LOW (ref 36.0–46.0)
Hemoglobin: 10.6 g/dL — ABNORMAL LOW (ref 12.0–15.0)
MCH: 31.5 pg (ref 26.0–34.0)
MCHC: 34.1 g/dL (ref 30.0–36.0)
MCV: 92.6 fL (ref 80.0–100.0)
Platelets: 173 10*3/uL (ref 150–400)
RBC: 3.36 MIL/uL — ABNORMAL LOW (ref 3.87–5.11)
RDW: 13.2 % (ref 11.5–15.5)
WBC: 12.2 10*3/uL — ABNORMAL HIGH (ref 4.0–10.5)
nRBC: 0 % (ref 0.0–0.2)

## 2022-05-29 LAB — URIC ACID: Uric Acid, Serum: 4.7 mg/dL (ref 2.5–7.1)

## 2022-05-29 MED ORDER — CEFAZOLIN SODIUM-DEXTROSE 2-4 GM/100ML-% IV SOLN
2.0000 g | Freq: Three times a day (TID) | INTRAVENOUS | Status: DC
  Administered 2022-05-29 – 2022-06-08 (×29): 2 g via INTRAVENOUS
  Filled 2022-05-29 (×30): qty 100

## 2022-05-29 MED ORDER — ONDANSETRON HCL 4 MG PO TABS
4.0000 mg | ORAL_TABLET | Freq: Four times a day (QID) | ORAL | Status: DC | PRN
  Administered 2022-05-29 – 2022-05-30 (×2): 4 mg via ORAL
  Filled 2022-05-29 (×4): qty 1

## 2022-05-29 NOTE — Progress Notes (Signed)
Mobility Specialist Progress Note:   05/29/22 0930  Mobility  Activity Dangled on edge of bed  Level of Assistance Moderate assist, patient does 50-74%  Assistive Device None  Activity Response Tolerated poorly  $Mobility charge 1 Mobility   Pt eager for mobility session, however session limited by 10/10 pain. Pt able to get EOB with modA, then became agitated and stated she couldn't do anymore. Pt back in bed on L side, bed alarm on.   Nelta Numbers Acute Rehab Secure Chat or Office Phone: [phone]

## 2022-05-29 NOTE — Plan of Care (Addendum)
Patient is insisting to use BSC even tho she in too much pain. Need 2-3 persons to assist her. She aggressively talk to nurse/NT if she did not get what she want.   Problem: Pain Managment: Goal: General experience of comfort will improve Outcome: Progressing
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
## 2022-05-29 NOTE — Progress Notes (Signed)
PHARMACY - PHYSICIAN COMMUNICATION CRITICAL VALUE ALERT - BLOOD CULTURE IDENTIFICATION (BCID)  Kristin Gilmore is an 71 y.o. female who presented to [facility] on 05/27/2022 with a chief complaint of radiating back pain.  Assessment: Patient has history of back surgeries with hardware placement--MRI spine pending. Ortho concerned for potential septic bursitis of the right elbow as well.   Micro called back 8/30 pm - Bcx know growing 2/4 GPC, BCID identifying as MSSA.   Name of physician (or Provider) Contacted:  N/a - primary team already notified  Current antibiotics:  Vancomycin, cefepime, Flagyl  Changes to prescribed antibiotics recommended:  MSSA bacteremia is an automatic consult to ID. Will narrow abx to cefazolin in setting of MSSA Bcx and continue to monitor.   Results for orders placed or performed during the hospital encounter of 05/27/22  Blood Culture ID Panel (Reflexed) (Collected: 05/28/2022 10:41 AM)  Result Value Ref Range   Enterococcus faecalis NOT DETECTED NOT DETECTED   Enterococcus Faecium NOT DETECTED NOT DETECTED   Listeria monocytogenes NOT DETECTED NOT DETECTED   Staphylococcus species DETECTED (A) NOT DETECTED   Staphylococcus aureus (BCID) DETECTED (A) NOT DETECTED   Staphylococcus epidermidis NOT DETECTED NOT DETECTED   Staphylococcus lugdunensis NOT DETECTED NOT DETECTED   Streptococcus species NOT DETECTED NOT DETECTED   Streptococcus agalactiae NOT DETECTED NOT DETECTED   Streptococcus pneumoniae NOT DETECTED NOT DETECTED   Streptococcus pyogenes NOT DETECTED NOT DETECTED   A.calcoaceticus-baumannii NOT DETECTED NOT DETECTED   Bacteroides fragilis NOT DETECTED NOT DETECTED   Enterobacterales NOT DETECTED NOT DETECTED   Enterobacter cloacae complex NOT DETECTED NOT DETECTED   Escherichia coli NOT DETECTED NOT DETECTED   Klebsiella aerogenes NOT DETECTED NOT DETECTED   Klebsiella oxytoca NOT DETECTED NOT DETECTED   Klebsiella pneumoniae NOT  DETECTED NOT DETECTED   Proteus species NOT DETECTED NOT DETECTED   Salmonella species NOT DETECTED NOT DETECTED   Serratia marcescens NOT DETECTED NOT DETECTED   Haemophilus influenzae NOT DETECTED NOT DETECTED   Neisseria meningitidis NOT DETECTED NOT DETECTED   Pseudomonas aeruginosa NOT DETECTED NOT DETECTED   Stenotrophomonas maltophilia NOT DETECTED NOT DETECTED   Candida albicans NOT DETECTED NOT DETECTED   Candida auris NOT DETECTED NOT DETECTED   Candida glabrata NOT DETECTED NOT DETECTED   Candida krusei NOT DETECTED NOT DETECTED   Candida parapsilosis NOT DETECTED NOT DETECTED   Candida tropicalis NOT DETECTED NOT DETECTED   Cryptococcus neoformans/gattii NOT DETECTED NOT DETECTED   Meth resistant mecA/C and MREJ NOT DETECTED NOT DETECTED   Erin Hearing PharmD., BCPS Clinical Pharmacist 05/29/2022 4:51 PM

## 2022-05-29 NOTE — Progress Notes (Signed)
PHARMACY - PHYSICIAN COMMUNICATION CRITICAL VALUE ALERT - BLOOD CULTURE IDENTIFICATION (BCID)  Kristin Gilmore is an 71 y.o. female who presented to [facility] on 05/27/2022 with a chief complaint of radiating back pain.  Assessment: Patient has history of back surgeries with hardware placement--MRI spine pending. Ortho concerned for potential septic bursitis of the right elbow as well. Bcx growing 1/4 GPC, BCID identifying as MSSA.   Name of physician (or Provider) Contacted:  Holli Humbles, MD  Current antibiotics:  Vancomycin, cefepime, Flagyl  Changes to prescribed antibiotics recommended:  MSSA bacteremia is an automatic consult to ID. Will narrow abx to cefazolin in setting of MSSA Bcx and continue to monitor.   Results for orders placed or performed during the hospital encounter of 05/27/22  Blood Culture ID Panel (Reflexed) (Collected: 05/28/2022 10:41 AM)  Result Value Ref Range   Enterococcus faecalis NOT DETECTED NOT DETECTED   Enterococcus Faecium NOT DETECTED NOT DETECTED   Listeria monocytogenes NOT DETECTED NOT DETECTED   Staphylococcus species DETECTED (A) NOT DETECTED   Staphylococcus aureus (BCID) DETECTED (A) NOT DETECTED   Staphylococcus epidermidis NOT DETECTED NOT DETECTED   Staphylococcus lugdunensis NOT DETECTED NOT DETECTED   Streptococcus species NOT DETECTED NOT DETECTED   Streptococcus agalactiae NOT DETECTED NOT DETECTED   Streptococcus pneumoniae NOT DETECTED NOT DETECTED   Streptococcus pyogenes NOT DETECTED NOT DETECTED   A.calcoaceticus-baumannii NOT DETECTED NOT DETECTED   Bacteroides fragilis NOT DETECTED NOT DETECTED   Enterobacterales NOT DETECTED NOT DETECTED   Enterobacter cloacae complex NOT DETECTED NOT DETECTED   Escherichia coli NOT DETECTED NOT DETECTED   Klebsiella aerogenes NOT DETECTED NOT DETECTED   Klebsiella oxytoca NOT DETECTED NOT DETECTED   Klebsiella pneumoniae NOT DETECTED NOT DETECTED   Proteus species NOT DETECTED  NOT DETECTED   Salmonella species NOT DETECTED NOT DETECTED   Serratia marcescens NOT DETECTED NOT DETECTED   Haemophilus influenzae NOT DETECTED NOT DETECTED   Neisseria meningitidis NOT DETECTED NOT DETECTED   Pseudomonas aeruginosa NOT DETECTED NOT DETECTED   Stenotrophomonas maltophilia NOT DETECTED NOT DETECTED   Candida albicans NOT DETECTED NOT DETECTED   Candida auris NOT DETECTED NOT DETECTED   Candida glabrata NOT DETECTED NOT DETECTED   Candida krusei NOT DETECTED NOT DETECTED   Candida parapsilosis NOT DETECTED NOT DETECTED   Candida tropicalis NOT DETECTED NOT DETECTED   Cryptococcus neoformans/gattii NOT DETECTED NOT DETECTED   Meth resistant mecA/C and MREJ NOT DETECTED NOT [place], PharmD PGY1 Pharmacy Resident 8/30/20233:37 PM

## 2022-05-29 NOTE — Progress Notes (Signed)
Mobility Specialist Progress Note:   05/29/22 1040  Mobility  Activity Transferred to/from [facility]  Level of Assistance Minimal assist, patient does 75% or more (+2)  Assistive Device BSC;Front wheel walker  Distance Ambulated (ft) 2 ft  Activity Response Tolerated poorly  $Mobility charge 1 Mobility   Pt received on BSC, requesting to transfer back to bed, adamantly refusing sitting up in chair. Pt not receptive to education/feedback. Prefers to use something stationary (chair, BSC) vs RW despite encouragement. Pt left sitting EOB with all needs met, bed alarm on.   Nelta Numbers Acute Rehab Secure Chat or Office Phone: [phone]

## 2022-05-29 NOTE — Progress Notes (Signed)
PROGRESS NOTE    Kristin Gilmore  PTW:[id number] DOB: October 31, 1950 DOA: 05/27/2022 PCP: [facility] [place]   Brief Narrative:  Kristin Gilmore is a 71 y.o. female with medical history significant of hypertension, dyslipidemia, prediabetes, anxiety, depression who presents with complaints of worsening back pain, denies trauma. Patient reports having severe pain in her lower back with radiation down the left leg. Sees Dr. Louanne Skye and has issues with chronic pain in this area.  They were trying to arrange for her to have an outpatient nerve block, but have been waiting on insurance approval.  She reports that she last had surgery of her lumbar spine back in January of this year.  She denies having any significant fevers, chills, saddle anesthesia, or  incontinence.  Assessment & Plan:   Principal Problem:   Intractable back pain Active Problems:   SIRS (systemic inflammatory response syndrome) (HCC)   Lumbar spinal stenosis   Parkinson's disease (HCC)   Bipolar disorder, in partial remission, most recent episode hypomanic (HCC)   Hyperkalemia   Dehydration   Essential hypertension   Lower back pain; acute on chronic, chronic history of lumbar stenosis with radiculopathy, POA - Acute on chronic pain without deficits or paresthesias. - Initial plan for neurology evaluation and potential nerve block however this appears to have been done previously this month with minimal to no improvement, as such will not be repeated per documentation given limited therapeutics but increased risk of complications. -MRI pending, previously declined by patient, now awaiting for sedation given her intolerance to MRI -Continue home pain meds including hydrocodone and p.o. Dilaudid -appears to be moderately well controlled at this time, not pain-free but again near her baseline. -Dr. Louanne Skye orthopedics following, appreciate insight and recommendations   SIRS without overt source for  infection Rule out bursitis -Potentially secondary to uncontrolled pain  -Single febrile event at intake, currently on cefepime, Flagyl, vancomycin for broad-spectrum coverage  -Imaging shows questionable right olecranon bursitis -Check blood cultures -ESR 41, CRP 32, lactic acid normal, INR normal -De-escalate antibiotics as appropriate -   Acute hyperkalemia Dehydration/AKI K - Initially 5.3, resolved with IV fluids Poor p.o. intake, IV fluids given at admission, creatinine resolved   Essential hypertension -Uncontrolled in the setting of pain -Resume home medications amlodipine carvedilol telmisartan -Furosemide on hold given above AKI   Anxiety and depression -Currently on Xanax only, med rec pending for patient's chronic medications -Current unconfirmed medications listed include amitriptyline, Seroquel, Effexor   Prediabetes Last hemoglobin A1c 5.8 on 06/22/2021.  Home medication regimen appears to include glipizide.   Parkinson's disease Home medication regimen includes Artane 2 mg 3 times daily.  DVT prophylaxis: Lovenox Code Status: Full Family Communication: None present  Status is: Inpatient  Dispo: The patient is from: Home              Anticipated d/c is to: Home              Anticipated d/c date is: 24 to 48 hours              Patient currently not medically stable for discharge  Consultants:  Orthopedic surgery  Procedures:  None  Antimicrobials:  Cefepime, vancomycin, Flagyl  Subjective: No acute issues or events overnight, review of systems limited as patient was requesting privacy  Objective: Vitals:   05/28/22 0200 05/28/22 0443 05/28/22 1148 05/28/22 2135  BP: (!) 156/63 (!) 171/137 139/63 129/68  Pulse: 90 (!) 104 84 94  Resp:  '20 20 20 18  ' Temp:  98.7 F (37.1 C) 98.7 F (37.1 C) 98.1 F (36.7 C)  TempSrc:  Oral Oral Oral  SpO2: 100% 92% 92% 95%  Weight:      Height:        Intake/Output Summary (Last 24 hours) at 05/29/2022  0755 Last data filed at 05/29/2022 0300 Gross per 24 hour  Intake 1491.02 ml  Output --  Net 1491.02 ml   Filed Weights   05/27/22 0757  Weight: 72.6 kg    Examination:  General:  Pleasantly resting in bed, No acute distress. HEENT:  Normocephalic atraumatic.  Sclerae nonicteric, noninjected.  Extraocular movements intact bilaterally. Neck:  Without mass or deformity.  Trachea is midline. Lungs:  Without rhonchi, wheeze, or rales. Heart:  Regular rate and rhythm. Abdomen:  Soft, nontender, nondistended.  Without guarding or rebound. Extremities: Without cyanosis, clubbing, edema, or deformity.  Data Reviewed: I have personally reviewed following labs and imaging studies  CBC: Recent Labs  Lab 05/27/22 1046 05/28/22 1038  WBC 12.7* 16.8*  NEUTROABS 10.3* 13.3*  HGB 12.6 12.1  HCT 38.3 35.5*  MCV 94.3 92.4  PLT 227 672   Basic Metabolic Panel: Recent Labs  Lab 05/27/22 1046 05/28/22 1038  NA 136 132*  K 5.3* 4.2  CL 99 101  CO2 28 23  GLUCOSE 118* 130*  BUN 42* 24*  CREATININE 1.07* 0.88  CALCIUM 9.1 8.5*   GFR: Estimated Creatinine Clearance: 56.2 mL/min (by C-G formula based on SCr of 0.88 mg/dL). Liver Function Tests: Recent Labs  Lab 05/28/22 1038  AST 26  ALT 28  ALKPHOS 75  BILITOT 0.6  PROT 6.3*  ALBUMIN 3.3*   No results for input(s): "LIPASE", "AMYLASE" in the last 168 hours. No results for input(s): "AMMONIA" in the last 168 hours. Coagulation Profile: Recent Labs  Lab 05/28/22 1038  INR 1.2   Cardiac Enzymes: No results for input(s): "CKTOTAL", "CKMB", "CKMBINDEX", "TROPONINI" in the last 168 hours. BNP (last 3 results) No results for input(s): "PROBNP" in the last 8760 hours. HbA1C: No results for input(s): "HGBA1C" in the last 72 hours. CBG: No results for input(s): "GLUCAP" in the last 168 hours. Lipid Profile: No results for input(s): "CHOL", "HDL", "LDLCALC", "TRIG", "CHOLHDL", "LDLDIRECT" in the last 72 hours. Thyroid  Function Tests: No results for input(s): "TSH", "T4TOTAL", "FREET4", "T3FREE", "THYROIDAB" in the last 72 hours. Anemia Panel: No results for input(s): "VITAMINB12", "FOLATE", "FERRITIN", "TIBC", "IRON", "RETICCTPCT" in the last 72 hours. Sepsis Labs: Recent Labs  Lab 05/28/22 1038 05/28/22 1231  LATICACIDVEN 1.0 0.9    Recent Results (from the past 240 hour(s))  Resp Panel by RT-PCR (Flu A&B, Covid) Urine, Clean Catch     Status: None   Collection Time: 05/27/22  8:11 PM   Specimen: Urine, Clean Catch; Nasal Swab  Result Value Ref Range Status   SARS Coronavirus 2 by RT PCR NEGATIVE NEGATIVE Final    Comment: (NOTE) SARS-CoV-2 target nucleic acids are NOT DETECTED.  The SARS-CoV-2 RNA is generally detectable in upper respiratory specimens during the acute phase of infection. The lowest concentration of SARS-CoV-2 viral copies this assay can detect is 138 copies/mL. A negative result does not preclude SARS-Cov-2 infection and should not be used as the sole basis for treatment or other patient management decisions. A negative result may occur with  improper specimen collection/handling, submission of specimen other than nasopharyngeal swab, presence of viral mutation(s) within the areas targeted by this assay, and inadequate  number of viral copies(<138 copies/mL). A negative result must be combined with clinical observations, patient history, and epidemiological information. The expected result is Negative.  Fact Sheet for Patients:  EntrepreneurPulse.com.au  Fact Sheet for Healthcare Providers:  IncredibleEmployment.be  This test is no t yet approved or cleared by the [place] FDA and  has been authorized for detection and/or diagnosis of SARS-CoV-2 by FDA under an Emergency Use Authorization (EUA). This EUA will remain  in effect (meaning this test can be used) for the duration of the COVID-19 declaration under Section 564(b)(1) of  the Act, 21 U.S.C.section 360bbb-3(b)(1), unless the authorization is terminated  or revoked sooner.       Influenza A by PCR NEGATIVE NEGATIVE Final   Influenza B by PCR NEGATIVE NEGATIVE Final    Comment: (NOTE) The Xpert Xpress SARS-CoV-2/FLU/RSV plus assay is intended as an aid in the diagnosis of influenza from Nasopharyngeal swab specimens and should not be used as a sole basis for treatment. Nasal washings and aspirates are unacceptable for Xpert Xpress SARS-CoV-2/FLU/RSV testing.  Fact Sheet for Patients: EntrepreneurPulse.com.au  Fact Sheet for Healthcare Providers: IncredibleEmployment.be  This test is not yet approved or cleared by the [place] FDA and has been authorized for detection and/or diagnosis of SARS-CoV-2 by FDA under an Emergency Use Authorization (EUA). This EUA will remain in effect (meaning this test can be used) for the duration of the COVID-19 declaration under Section 564(b)(1) of the Act, 21 U.S.C. section 360bbb-3(b)(1), unless the authorization is terminated or revoked.  Performed at [facility], [address], [place], [place] [address]   Culture, blood (x 2)     Status: None (Preliminary result)   Collection Time: 05/28/22 10:38 AM   Specimen: BLOOD RIGHT ARM  Result Value Ref Range Status   Specimen Description BLOOD RIGHT ARM  Final   Special Requests   Final    BOTTLES DRAWN AEROBIC AND ANAEROBIC Blood Culture adequate volume   Culture   Final    NO GROWTH < 24 HOURS Performed at [facility], [place] [address] [place], [place] [address]    Report Status PENDING  Incomplete  Culture, blood (x 2)     Status: None (Preliminary result)   Collection Time: 05/28/22 10:41 AM   Specimen: BLOOD LEFT ARM  Result Value Ref Range Status   Specimen Description BLOOD LEFT ARM  Final   Special Requests   Final    BOTTLES DRAWN AEROBIC AND ANAEROBIC Blood Culture adequate volume    Culture   Final    NO GROWTH < 24 HOURS Performed at [facility], [place] [address], [place], [place] [address]    Report Status PENDING  Incomplete         Radiology Studies: DG Chest Portable 1 View  Result Date: 05/27/2022 CLINICAL DATA:  Chest pain and fevers, initial encounter EXAM: PORTABLE CHEST 1 VIEW COMPARISON:  11/30/2018 FINDINGS: Cardiac shadow is stable. Lungs are well aerated bilaterally. No focal infiltrate or effusion is seen. Degenerative changes of the thoracic spine are noted. IMPRESSION: No active disease. Electronically Signed   By: Inez Catalina M.D.   On: 05/27/2022 20:32    Scheduled Meds:  amLODipine  5 mg Oral BID   carvedilol  12.5 mg Oral BID WC   enoxaparin (LOVENOX) injection  40 mg Subcutaneous Q24H   irbesartan  300 mg Oral Daily   sodium chloride flush  3 mL Intravenous Q12H   trihexyphenidyl  2  mg Oral TID WC   Continuous Infusions:  ceFEPime (MAXIPIME) IV 2 g (05/28/22 2131)   metronidazole 500 mg (05/28/22 2137)   vancomycin       LOS: 1 day   Time spent: 22mn  Kaylina Cahue C Keigan Girten, DO [facility]  If 7PM-7AM, please contact night-coverage [email or web address]  05/29/2022, 7:55 AM

## 2022-05-29 NOTE — Progress Notes (Signed)
Patient refusing to leave on cardiac monitoring leads. They have been replaced multiple times. Order has been discontinued due to refusal

## 2022-05-29 NOTE — Consult Note (Signed)
[place] for Infectious Disease    Date of Admission:  05/27/2022     Reason for Consult: MSSA bacteremia     Referring Physician: Garwin Brothers consult  Current antibiotics: Cefazolin  ASSESSMENT:    71 y.o. female admitted with:  MSSA Bacteremia: Blood cultures from admission are positive on 05/28/2022.  TTE and repeat blood cultures ordered today. Severe back pain: In the setting of prior lumbar spine surgery with instrumentation in January 2023.  Back pain has been recalcitrant to selective nerve block in August 2023.  Additionally, CT scan in July 2023 showed possible findings consistent with screw loosening.  MRI with contrast has been ordered and is pending. Possible right elbow bursitis: Concerning for the possibility of septic bursitis in the setting of MSSA bacteremia.  Cannot exclude septic arthritis given her bacteremia. Sepsis: Secondary to #1. Acute kidney injury: Resolved with IV fluids. Parkinson's disease. Diabetes: Most recent A1c of 5.8 in September 2022.  RECOMMENDATIONS:    Continue Cefazolin TTE Repeat blood cultures MRI lumbar spine pending CT right elbow  Lab monitoring Following   Principal Problem:   Bacteremia due to methicillin susceptible Staphylococcus aureus (MSSA) Active Problems:   Lumbar spinal stenosis   Parkinson's disease (HCC)   Intractable back pain   SIRS (systemic inflammatory response syndrome) (HCC)   Bipolar disorder, in partial remission, most recent episode hypomanic (HCC)   Hyperkalemia   Dehydration   Essential hypertension   MEDICATIONS:    Scheduled Meds:  amLODipine  5 mg Oral BID   carvedilol  12.5 mg Oral BID WC   enoxaparin (LOVENOX) injection  40 mg Subcutaneous Q24H   irbesartan  300 mg Oral Daily   sodium chloride flush  3 mL Intravenous Q12H   trihexyphenidyl  2 mg Oral TID WC   Continuous Infusions:   ceFAZolin (ANCEF) IV     PRN Meds:.acetaminophen **OR** acetaminophen, albuterol, ALPRAZolam,  HYDROcodone-acetaminophen, HYDROmorphone, ondansetron  HPI:    Kristin Gilmore is a 71 y.o. female with with a past medical history as noted below who presented 2 days ago with severe pain in her lower back.  She has a history of chronic back pain followed by Dr. Louanne Skye.  She has a history of prior spine surgery in January 2023 with L3-4 and L4-5 transforaminal lumbar interbody fusion with rod, screws, cages, bone graft performed by Dr Louanne Skye.  She received an outpatient nerve block in early August without much improvement in her pain symptoms.  Given her presentation, an MRI is pending to further evaluate her lumbar spine and will have to be done with sedation currently planned for tomorrow.  She also had exam findings concerning for right olecranon bursitis.  She was started on broad-spectrum antibiotics.  Of note, she had a CT scan in July notable for mild periprosthetic lucency at the transpedicular screw at L5 consistent with loosening.  She was febrile on admission up to 101.4.  She had leukocytosis.  Her blood cultures are positive in 2 out of 2 sets for MSSA.  Her inflammatory markers are elevated.  She also has a history of total knee arthroplasty in September 2022 and prior L2-5 laminectomy in March 2019.   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   Pneumonia    Pre-diabetes    "i was told i was pre-diabetic a while ago"   Tremor 04/13/2014    Social History   Tobacco Use   Smoking status: Never   Smokeless tobacco: Never  Vaping Use   Vaping Use: Never used  Substance Use Topics   Alcohol use: Yes    Comment: occasional glass of wine   Drug use: No    Family History  Problem Relation Age of Onset   Cancer  Father        stomach cancer   Migraines Mother     Allergies  Allergen Reactions   Levofloxacin Other (See Comments)     Dizziness (intolerance)   Amoxicillin Hives    UNSPECIFIED REACTION  Has patient had a PCN reaction causing immediate rash, facial/tongue/throat swelling, SOB or lightheadedness with hypotension: Unknown Has patient had a PCN reaction causing severe rash involving mucus membranes or skin necrosis: Unknown Has patient had a PCN reaction that required hospitalization: Unknown Has patient had a PCN reaction occurring within the last 10 years: No If all of the above answers are "NO", then may proceed with Cephalosporin use. Sweats     Clarithromycin Hives, Other (See Comments) and Rash   Atropine Hives   Fish Allergy Other (See Comments)    Childhood reaction    Review of Systems  All other systems reviewed and are negative.  Except as noted above.   OBJECTIVE:   Blood pressure (!) 166/97, pulse 88, temperature 99.3 F (37.4 C), temperature source Oral, resp. rate 17, height '5\' 7"'$  (1.702 m), weight 72.6 kg, SpO2 96 %. Body mass index is 25.06 kg/m.  Physical Exam Constitutional:      General: She is not in acute distress.    Appearance: Normal appearance.     Comments: She is lying on her left side.  HENT:     Head: Normocephalic and atraumatic.     Mouth/Throat:     Mouth: Mucous membranes are moist.     Pharynx: Oropharynx is clear.  Eyes:     Extraocular Movements: Extraocular movements intact.     Conjunctiva/sclera: Conjunctivae normal.  Cardiovascular:     Pulses: Normal pulses.  Pulmonary:     Effort: Pulmonary effort is normal. No respiratory distress.  Abdominal:     General: There is no distension.     Palpations: Abdomen is soft.  Musculoskeletal:     Cervical back: Normal range of motion and neck supple.     Comments: Right elbow erythematous and swollen.  TTP  Skin:    General: Skin is warm and dry.     Findings: No rash.   Neurological:     General: No focal deficit present.     Mental Status: She is alert and oriented to person, place, and time.     Comments: She has a resting tremor.  Psychiatric:        Mood and Affect: Mood normal.        Behavior: Behavior normal.      Lab Results: Lab Results  Component Value Date   WBC 12.2 (H) 05/29/2022   HGB 10.6 (L) 05/29/2022   HCT 31.1 (L) 05/29/2022   MCV 92.6 05/29/2022   PLT 173 05/29/2022    Lab Results  Component Value Date   NA 132 (L) 05/28/2022   K 4.2 05/28/2022   CO2 23 05/28/2022   GLUCOSE 130 (H) 05/28/2022   BUN 24 (  H) 05/28/2022   CREATININE 0.88 05/28/2022   CALCIUM 8.5 (L) 05/28/2022   GFRNONAA >60 05/28/2022   GFRAA >60 12/20/2017    Lab Results  Component Value Date   ALT 28 05/28/2022   AST 26 05/28/2022   ALKPHOS 75 05/28/2022   BILITOT 0.6 05/28/2022       Component Value Date/Time   CRP 32.1 (H) 05/28/2022 1613       Component Value Date/Time   ESRSEDRATE 41 (H) 05/28/2022 1613    I have reviewed the micro and lab results in Epic.  Imaging: DG Chest Portable 1 View  Result Date: 05/27/2022 CLINICAL DATA:  Chest pain and fevers, initial encounter EXAM: PORTABLE CHEST 1 VIEW COMPARISON:  11/30/2018 FINDINGS: Cardiac shadow is stable. Lungs are well aerated bilaterally. No focal infiltrate or effusion is seen. Degenerative changes of the thoracic spine are noted. IMPRESSION: No active disease. Electronically Signed   By: Inez Catalina M.D.   On: 05/27/2022 20:32     Imaging independently reviewed in Epic.  [facility] for Infectious Disease [facility] [phone] pager 05/29/2022, 6:32 PM

## 2022-05-29 NOTE — Progress Notes (Signed)
Patient ID: Kristin Gilmore, female   DOB: 1951-03-11, 71 y.o.   MRN: [id number] Awake, alert and oriented x 4. I feel some better today. Had previous SNRB in early August without improvement in pain pattern. Right elbow with some improvement in swelling and redness. Legs with normal motor in L5 distribution.  Left hip pain and back pain complaints. MRI of the lumbar ordered  Will check for radiographic signs of left cause for left hip pain and for  Right distal humerus soft tissue swelling cause above the elbow.

## 2022-05-30 ENCOUNTER — Inpatient Hospital Stay (HOSPITAL_COMMUNITY): Payer: Medicare Other | Admitting: Certified Registered Nurse Anesthetist

## 2022-05-30 ENCOUNTER — Other Ambulatory Visit (HOSPITAL_COMMUNITY): Payer: Medicare Other

## 2022-05-30 ENCOUNTER — Inpatient Hospital Stay (HOSPITAL_COMMUNITY): Payer: Medicare Other

## 2022-05-30 ENCOUNTER — Encounter (HOSPITAL_COMMUNITY): Payer: Self-pay | Admitting: Internal Medicine

## 2022-05-30 ENCOUNTER — Encounter (HOSPITAL_COMMUNITY)
Admission: EM | Disposition: A | Discharge status: Skilled Nursing Facility | Payer: Self-pay | Source: Home / Self Care | Attending: Internal Medicine

## 2022-05-30 DIAGNOSIS — I340 Nonrheumatic mitral (valve) insufficiency: Secondary | ICD-10-CM | POA: Diagnosis not present

## 2022-05-30 DIAGNOSIS — I10 Essential (primary) hypertension: Secondary | ICD-10-CM

## 2022-05-30 DIAGNOSIS — N289 Disorder of kidney and ureter, unspecified: Secondary | ICD-10-CM | POA: Diagnosis not present

## 2022-05-30 DIAGNOSIS — R7881 Bacteremia: Secondary | ICD-10-CM | POA: Diagnosis not present

## 2022-05-30 DIAGNOSIS — M1990 Unspecified osteoarthritis, unspecified site: Secondary | ICD-10-CM | POA: Diagnosis not present

## 2022-05-30 DIAGNOSIS — B9561 Methicillin susceptible Staphylococcus aureus infection as the cause of diseases classified elsewhere: Secondary | ICD-10-CM | POA: Diagnosis not present

## 2022-05-30 HISTORY — PX: RADIOLOGY WITH ANESTHESIA: SHX6223

## 2022-05-30 LAB — COMPREHENSIVE METABOLIC PANEL
ALT: 20 U/L (ref 0–44)
AST: 18 U/L (ref 15–41)
Albumin: 2.7 g/dL — ABNORMAL LOW (ref 3.5–5.0)
Alkaline Phosphatase: 73 U/L (ref 38–126)
Anion gap: 9 (ref 5–15)
BUN: 17 mg/dL (ref 8–23)
CO2: 25 mmol/L (ref 22–32)
Calcium: 8.7 mg/dL — ABNORMAL LOW (ref 8.9–10.3)
Chloride: 101 mmol/L (ref 98–111)
Creatinine, Ser: 1 mg/dL (ref 0.44–1.00)
GFR, Estimated: 60 mL/min — ABNORMAL LOW (ref >60)
Glucose, Bld: 165 mg/dL — ABNORMAL HIGH (ref 70–99)
Potassium: 4.3 mmol/L (ref 3.5–5.1)
Sodium: 135 mmol/L (ref 135–145)
Total Bilirubin: 0.5 mg/dL (ref 0.3–1.2)
Total Protein: 6.1 g/dL — ABNORMAL LOW (ref 6.5–8.1)

## 2022-05-30 LAB — CBC
HCT: 31.9 % — ABNORMAL LOW (ref 36.0–46.0)
Hemoglobin: 10.9 g/dL — ABNORMAL LOW (ref 12.0–15.0)
MCH: 31.7 pg (ref 26.0–34.0)
MCHC: 34.2 g/dL (ref 30.0–36.0)
MCV: 92.7 fL (ref 80.0–100.0)
Platelets: 213 10*3/uL (ref 150–400)
RBC: 3.44 MIL/uL — ABNORMAL LOW (ref 3.87–5.11)
RDW: 13.2 % (ref 11.5–15.5)
WBC: 12.1 10*3/uL — ABNORMAL HIGH (ref 4.0–10.5)
nRBC: 0 % (ref 0.0–0.2)

## 2022-05-30 LAB — ECHOCARDIOGRAM COMPLETE
Area-P 1/2: 4.21 cm2
Height: 67 in
S' Lateral: 3.6 cm
Weight: 2592 oz

## 2022-05-30 SURGERY — MRI WITH ANESTHESIA
Anesthesia: General

## 2022-05-30 MED ORDER — LIDOCAINE 2% (20 MG/ML) 5 ML SYRINGE
INTRAMUSCULAR | Status: DC | PRN
  Administered 2022-05-30: 60 mg via INTRAVENOUS

## 2022-05-30 MED ORDER — PROPOFOL 10 MG/ML IV BOLUS
INTRAVENOUS | Status: DC | PRN
  Administered 2022-05-30: 140 mg via INTRAVENOUS

## 2022-05-30 MED ORDER — DEXAMETHASONE SODIUM PHOSPHATE 10 MG/ML IJ SOLN
INTRAMUSCULAR | Status: DC | PRN
  Administered 2022-05-30: 10 mg via INTRAVENOUS

## 2022-05-30 MED ORDER — FENTANYL CITRATE (PF) 250 MCG/5ML IJ SOLN
INTRAMUSCULAR | Status: DC | PRN
  Administered 2022-05-30: 100 ug via INTRAVENOUS

## 2022-05-30 MED ORDER — PHENYLEPHRINE HCL-NACL 20-0.9 MG/250ML-% IV SOLN
INTRAVENOUS | Status: DC | PRN
  Administered 2022-05-30: 25 ug/min via INTRAVENOUS

## 2022-05-30 MED ORDER — ONDANSETRON HCL 4 MG/2ML IJ SOLN
INTRAMUSCULAR | Status: DC | PRN
  Administered 2022-05-30: 4 mg via INTRAVENOUS

## 2022-05-30 MED ORDER — CHLORHEXIDINE GLUCONATE 0.12 % MT SOLN
OROMUCOSAL | Status: AC
  Administered 2022-05-30: 15 mL via OROMUCOSAL
  Filled 2022-05-30: qty 15

## 2022-05-30 MED ORDER — MIDAZOLAM HCL 2 MG/2ML IJ SOLN
INTRAMUSCULAR | Status: DC | PRN
  Administered 2022-05-30 (×2): 1 mg via INTRAVENOUS

## 2022-05-30 MED ORDER — SUGAMMADEX SODIUM 200 MG/2ML IV SOLN
INTRAVENOUS | Status: DC | PRN
  Administered 2022-05-30: 200 mg via INTRAVENOUS

## 2022-05-30 MED ORDER — ORAL CARE MOUTH RINSE: 15.0000 mL | Freq: Once | OROMUCOSAL | Status: AC

## 2022-05-30 MED ORDER — FAMOTIDINE IN NACL 20-0.9 MG/50ML-% IV SOLN
20.0000 mg | Freq: Once | INTRAVENOUS | Status: AC
  Administered 2022-05-30: 20 mg via INTRAVENOUS
  Filled 2022-05-30: qty 50

## 2022-05-30 MED ORDER — ROCURONIUM BROMIDE 10 MG/ML (PF) SYRINGE
PREFILLED_SYRINGE | INTRAVENOUS | Status: DC | PRN
  Administered 2022-05-30: 70 mg via INTRAVENOUS

## 2022-05-30 MED ORDER — LACTATED RINGERS IV SOLN: INTRAVENOUS | Status: DC

## 2022-05-30 MED ORDER — GADOBUTROL 1 MMOL/ML IV SOLN
10.0000 mL | Freq: Once | INTRAVENOUS | Status: AC | PRN
  Administered 2022-05-30: 10 mL via INTRAVENOUS

## 2022-05-30 MED ORDER — CHLORHEXIDINE GLUCONATE 0.12 % MT SOLN: 15.0000 mL | Freq: Once | OROMUCOSAL | Status: AC

## 2022-05-30 NOTE — Anesthesia Preprocedure Evaluation (Addendum)
Anesthesia Evaluation  Patient identified by MRN, date of birth, ID band Patient awake    Reviewed: Allergy & Precautions, NPO status , Patient's Chart, lab work & pertinent test results, reviewed documented beta blocker date and time   History of Anesthesia Complications Negative for: history of anesthetic complications  Airway Mallampati: III  TM Distance: >3 FB Neck ROM: Full    Dental  (+) Dental Advisory Given   Pulmonary    breath sounds clear to auscultation       Cardiovascular hypertension, Pt. on medications and Pt. on home beta blockers  Rhythm:Regular  Left ventricle: The cavity size was normal. Wall thickness was  normal. Systolic function was normal. The estimated ejection  fraction was in the range of 55% to 60%. Wall motion was normal;  there were no regional wall motion abnormalities. Doppler  parameters are consistent with both elevated ventricular  end-diastolic filling pressure and elevated left atrial filling  pressure.  - Left atrium: The atrium was mildly dilated.  - Atrial septum: No defect or patent foramen ovale was identified.    Neuro/Psych  Headaches, PSYCHIATRIC DISORDERS Depression Bipolar Disorder  Neuromuscular disease    GI/Hepatic negative GI ROS, Neg liver ROS,   Endo/Other  Lab Results      Component                Value               Date                      HGBA1C                   5.8 (H)             06/22/2021             Renal/GU Lab Results      Component                Value               Date                      CREATININE               1.00                05/30/2022                Musculoskeletal  (+) Arthritis ,   Abdominal   Peds  Hematology  (+) Blood dyscrasia, anemia , Lab Results      Component                Value               Date                      WBC                      12.1 (H)            05/30/2022                HGB                       10.9 (L)            05/30/2022  HCT                      31.9 (L)            05/30/2022                MCV                      92.7                05/30/2022                PLT                      213                 05/30/2022              Anesthesia Other Findings Severe back pain s/p back surgery, septic bursitis  Reproductive/Obstetrics                           Anesthesia Physical Anesthesia Plan  ASA: 3  Anesthesia Plan: General   Post-op Pain Management:    Induction: Intravenous  PONV Risk Score and Plan: 3 and Ondansetron and Dexamethasone  Airway Management Planned: Oral ETT  Additional Equipment: None  Intra-op Plan:   Post-operative Plan: Extubation in OR  Informed Consent: I have reviewed the patients History and Physical, chart, labs and discussed the procedure including the risks, benefits and alternatives for the proposed anesthesia with the patient or authorized representative who has indicated his/her understanding and acceptance.     Dental advisory given  Plan Discussed with: CRNA  Anesthesia Plan Comments:         Anesthesia Quick Evaluation

## 2022-05-30 NOTE — Progress Notes (Signed)
For MRI under sedation scheduled at 8 am, NPO at 1 am.

## 2022-05-30 NOTE — Progress Notes (Signed)
PROGRESS NOTE    Kristin Gilmore  ZDG:[id number] DOB: 02-27-1951 DOA: 05/27/2022 PCP: [facility] [place]   Brief Narrative:  Kristin Gilmore is a 71 y.o. female with medical history significant of hypertension, dyslipidemia, prediabetes, anxiety, depression who presents with complaints of worsening back pain, denies trauma. Patient reports having severe pain in her lower back with radiation down the left leg. Sees Dr. Louanne Skye and has issues with chronic pain in this area.  They were trying to arrange for her to have an outpatient nerve block, but have been waiting on insurance approval.  She reports that she last had surgery of her lumbar spine back in January of this year.  She denies having any significant fevers, chills, saddle anesthesia, or  incontinence.  Assessment & Plan:   Principal Problem:   Bacteremia due to methicillin susceptible Staphylococcus aureus (MSSA) Active Problems:   Intractable back pain   SIRS (systemic inflammatory response syndrome) (HCC)   Lumbar spinal stenosis   Parkinson's disease (HCC)   Bipolar disorder, in partial remission, most recent episode hypomanic (HCC)   Hyperkalemia   Dehydration   Essential hypertension    Lower back pain; acute on chronic, chronic history of lumbar stenosis with radiculopathy, POA - Acute on chronic pain without deficits or paresthesias. - Initial plan for neurology evaluation and potential nerve block however this appears to have been done previously this month with minimal to no improvement, as such will not be repeated per documentation given limited therapeutics but increased risk of complications. -MRI without overt findings to explain worsening back pain - discussed with ortho -Continue home pain meds including hydrocodone and p.o. Dilaudid - appears to be moderately well controlled at this time, not pain-free but again near her baseline. -Dr. Louanne Skye orthopedics following, appreciate  insight and recommendations - likely require outpatient follow up given no indication for procedure/intervention at this time   SIRS without overt source for infection Rule out MSSA bacteremia, POA Rule out bursitis, POA -Potentially secondary to uncontrolled pain  -Single febrile event at intake, currently on cefazolin -Imaging shows questionable right olecranon bursitis -Blood cultures growing MSSA - unclear source - CT R elbow; plain film L hip, R humerus, R elbow pending -ESR 41, CRP 32, lactic acid normal, INR normal -De-escalate antibiotics as appropriate   Acute hyperkalemia Dehydration/AKI, resolving K - Initially 5.3, resolved with IV fluids Poor p.o. intake, IV fluids given at admission, creatinine resolved   Essential hypertension -Uncontrolled in the setting of pain -Resume home medications amlodipine carvedilol telmisartan -Furosemide on hold given above AKI at intake - resume in next 24-48h   Anxiety and depression -Currently on Xanax only, med rec pending for patient's chronic medications -Current unconfirmed medications listed include amitriptyline, Seroquel, Effexor   Prediabetes Last hemoglobin A1c 5.8 on 06/22/2021.  Home medication regimen appears to include glipizide.   Parkinson's disease Home medication regimen includes Artane 2 mg 3 times daily.  DVT prophylaxis: Lovenox Code Status: Full Family Communication: None present  Status is: Inpatient  Dispo: The patient is from: Home              Anticipated d/c is to: Home              Anticipated d/c date is: 24 to 48 hours              Patient currently not medically stable for discharge  Consultants:  Orthopedic surgery  Procedures:  None  Antimicrobials:  Cefepime, vancomycin,  Flagyl  Subjective: No acute issues or events overnight, review of systems limited as patient was requesting privacy  Objective: Vitals:   05/29/22 0832 05/29/22 1556 05/30/22 0324 05/30/22 0806  BP: (!) 141/98 (!)  166/97 (!) 139/96 (!) 159/81  Pulse: 75 88 87 90  Resp: _0 Temp: 98.4 F (36.9 C) 99.3 F (37.4 C) 98.2 F (36.8 C) 98.7 F (37.1 C)  TempSrc: Oral Oral Oral Oral  SpO2: 98% 96% 94%   Weight:    73.5 kg  Height:    _1  (1.702 m)    Intake/Output Summary (Last 24 hours) at 05/30/2022 0829 Last data filed at 05/30/2022 0300 Gross per 24 hour  Intake 500 ml  Output --  Net 500 ml    Filed Weights   05/27/22 0757 05/30/22 0806  Weight: 72.6 kg 73.5 kg    Examination:  General:  Pleasantly resting in bed, No acute distress. HEENT:  Normocephalic atraumatic.  Sclerae nonicteric, noninjected.  Extraocular movements intact bilaterally. Neck:  Without mass or deformity.  Trachea is midline. Lungs:  Without rhonchi, wheeze, or rales. Heart:  Regular rate and rhythm. Abdomen:  Soft, nontender, nondistended.  Without guarding or rebound. Extremities: Without cyanosis, clubbing, edema, or deformity.  Data Reviewed: I have personally reviewed following labs and imaging studies  CBC: Recent Labs  Lab 05/27/22 1046 05/28/22 1038 05/29/22 1330 05/30/22 0621  WBC 12.7* 16.8* 12.2* 12.1*  NEUTROABS 10.3* 13.3*  --   --   HGB 12.6 12.1 10.6* 10.9*  HCT 38.3 35.5* 31.1* 31.9*  MCV 94.3 92.4 92.6 92.7  PLT 227 219 173 619    Basic Metabolic Panel: Recent Labs  Lab 05/27/22 1046 05/28/22 1038 05/30/22 0621  NA 136 132* 135  K 5.3* 4.2 4.3  CL 99 101 101  CO2 _2 GLUCOSE 118* 130* 165*  BUN 42* 24* 17  CREATININE 1.07* 0.88 1.00  CALCIUM 9.1 8.5* 8.7*    GFR: Estimated Creatinine Clearance: 49.5 mL/min (by C-G formula based on SCr of 1 mg/dL). Liver Function Tests: Recent Labs  Lab 05/28/22 1038 05/30/22 0621  AST 26 18  ALT 28 20  ALKPHOS 75 73  BILITOT 0.6 0.5  PROT 6.3* 6.1*  ALBUMIN 3.3* 2.7*    No results for input(s): "LIPASE", "AMYLASE" in the last 168 hours. No results for input(s): "AMMONIA" in the last 168 hours. Coagulation  Profile: Recent Labs  Lab 05/28/22 1038  INR 1.2    Cardiac Enzymes: No results for input(s): "CKTOTAL", "CKMB", "CKMBINDEX", "TROPONINI" in the last 168 hours. BNP (last 3 results) No results for input(s): "PROBNP" in the last 8760 hours. HbA1C: No results for input(s): "HGBA1C" in the last 72 hours. CBG: No results for input(s): "GLUCAP" in the last 168 hours. Lipid Profile: No results for input(s): "CHOL", "HDL", "LDLCALC", "TRIG", "CHOLHDL", "LDLDIRECT" in the last 72 hours. Thyroid Function Tests: No results for input(s): "TSH", "T4TOTAL", "FREET4", "T3FREE", "THYROIDAB" in the last 72 hours. Anemia Panel: No results for input(s): "VITAMINB12", "FOLATE", "FERRITIN", "TIBC", "IRON", "RETICCTPCT" in the last 72 hours. Sepsis Labs: Recent Labs  Lab 05/28/22 1038 05/28/22 1231  LATICACIDVEN 1.0 0.9     Recent Results (from the past 240 hour(s))  Resp Panel by RT-PCR (Flu A&B, Covid) Urine, Clean Catch     Status: None   Collection Time: 05/27/22  8:11 PM   Specimen: Urine, Clean Catch; Nasal Swab  Result Value Ref Range Status   SARS Coronavirus  2 by RT PCR NEGATIVE NEGATIVE Final    Comment: (NOTE) SARS-CoV-2 target nucleic acids are NOT DETECTED.  The SARS-CoV-2 RNA is generally detectable in upper respiratory specimens during the acute phase of infection. The lowest concentration of SARS-CoV-2 viral copies this assay can detect is 138 copies/mL. A negative result does not preclude SARS-Cov-2 infection and should not be used as the sole basis for treatment or other patient management decisions. A negative result may occur with  improper specimen collection/handling, submission of specimen other than nasopharyngeal swab, presence of viral mutation(s) within the areas targeted by this assay, and inadequate number of viral copies(<138 copies/mL). A negative result must be combined with clinical observations, patient history, and epidemiological information. The  expected result is Negative.  Fact Sheet for Patients:  EntrepreneurPulse.com.au  Fact Sheet for Healthcare Providers:  IncredibleEmployment.be  This test is no t yet approved or cleared by the [place] FDA and  has been authorized for detection and/or diagnosis of SARS-CoV-2 by FDA under an Emergency Use Authorization (EUA). This EUA will remain  in effect (meaning this test can be used) for the duration of the COVID-19 declaration under Section 564(b)(1) of the Act, 21 U.S.C.section 360bbb-3(b)(1), unless the authorization is terminated  or revoked sooner.       Influenza A by PCR NEGATIVE NEGATIVE Final   Influenza B by PCR NEGATIVE NEGATIVE Final    Comment: (NOTE) The Xpert Xpress SARS-CoV-2/FLU/RSV plus assay is intended as an aid in the diagnosis of influenza from Nasopharyngeal swab specimens and should not be used as a sole basis for treatment. Nasal washings and aspirates are unacceptable for Xpert Xpress SARS-CoV-2/FLU/RSV testing.  Fact Sheet for Patients: EntrepreneurPulse.com.au  Fact Sheet for Healthcare Providers: IncredibleEmployment.be  This test is not yet approved or cleared by the [place] FDA and has been authorized for detection and/or diagnosis of SARS-CoV-2 by FDA under an Emergency Use Authorization (EUA). This EUA will remain in effect (meaning this test can be used) for the duration of the COVID-19 declaration under Section 564(b)(1) of the Act, 21 U.S.C. section 360bbb-3(b)(1), unless the authorization is terminated or revoked.  Performed at [facility], [address] [place], [place] [address]   Culture, blood (x 2)     Status: None (Preliminary result)   Collection Time: 05/28/22 10:38 AM   Specimen: BLOOD RIGHT ARM  Result Value Ref Range Status   Specimen Description BLOOD RIGHT ARM  Final   Special Requests   Final    BOTTLES DRAWN  AEROBIC AND ANAEROBIC Blood Culture adequate volume   Culture  Setup Time   Final    GRAM POSITIVE COCCI IN CLUSTERS BOTTLES DRAWN AEROBIC ONLY CRITICAL RESULT CALLED TO, READ BACK BY AND VERIFIED WITH: Eustace Pen [phone] _0  FH    Culture   Final    NO GROWTH 2 DAYS Performed at [facility], [place] [address] [place], [place] [address]    Report Status PENDING  Incomplete  Culture, blood (x 2)     Status: None (Preliminary result)   Collection Time: 05/28/22 10:41 AM   Specimen: BLOOD LEFT ARM  Result Value Ref Range Status   Specimen Description BLOOD LEFT ARM  Final   Special Requests   Final    BOTTLES DRAWN AEROBIC AND ANAEROBIC Blood Culture adequate volume   Culture  Setup Time   Final    GRAM POSITIVE COCCI ANAEROBIC BOTTLE ONLY CRITICAL RESULT CALLED TO, READ BACK BY AND VERIFIED  WITH: Shawn Route [id number] AT 1406 BY CM Performed at [facility], [place] [address], [place], [place] [address]    Culture GRAM POSITIVE COCCI  Final   Report Status PENDING  Incomplete  Blood Culture ID Panel (Reflexed)     Status: Abnormal   Collection Time: 05/28/22 10:41 AM  Result Value Ref Range Status   Enterococcus faecalis NOT DETECTED NOT DETECTED Final   Enterococcus Faecium NOT DETECTED NOT DETECTED Final   Listeria monocytogenes NOT DETECTED NOT DETECTED Final   Staphylococcus species DETECTED (A) NOT DETECTED Final    Comment: CRITICAL RESULT CALLED TO, READ BACK BY AND VERIFIED WITH: PHARMD C DAVID [id number] AT 1406 BY CM    Staphylococcus aureus (BCID) DETECTED (A) NOT DETECTED Final    Comment: CRITICAL RESULT CALLED TO, READ BACK BY AND VERIFIED WITH: PHARMD C DAVIS [id number] AT 1406 BY CM    Staphylococcus epidermidis NOT DETECTED NOT DETECTED Final   Staphylococcus lugdunensis NOT DETECTED NOT DETECTED Final   Streptococcus species NOT DETECTED NOT DETECTED Final   Streptococcus agalactiae NOT DETECTED NOT DETECTED Final   Streptococcus pneumoniae NOT  DETECTED NOT DETECTED Final   Streptococcus pyogenes NOT DETECTED NOT DETECTED Final   A.calcoaceticus-baumannii NOT DETECTED NOT DETECTED Final   Bacteroides fragilis NOT DETECTED NOT DETECTED Final   Enterobacterales NOT DETECTED NOT DETECTED Final   Enterobacter cloacae complex NOT DETECTED NOT DETECTED Final   Escherichia coli NOT DETECTED NOT DETECTED Final   Klebsiella aerogenes NOT DETECTED NOT DETECTED Final   Klebsiella oxytoca NOT DETECTED NOT DETECTED Final   Klebsiella pneumoniae NOT DETECTED NOT DETECTED Final   Proteus species NOT DETECTED NOT DETECTED Final   Salmonella species NOT DETECTED NOT DETECTED Final   Serratia marcescens NOT DETECTED NOT DETECTED Final   Haemophilus influenzae NOT DETECTED NOT DETECTED Final   Neisseria meningitidis NOT DETECTED NOT DETECTED Final   Pseudomonas aeruginosa NOT DETECTED NOT DETECTED Final   Stenotrophomonas maltophilia NOT DETECTED NOT DETECTED Final   Candida albicans NOT DETECTED NOT DETECTED Final   Candida auris NOT DETECTED NOT DETECTED Final   Candida glabrata NOT DETECTED NOT DETECTED Final   Candida krusei NOT DETECTED NOT DETECTED Final   Candida parapsilosis NOT DETECTED NOT DETECTED Final   Candida tropicalis NOT DETECTED NOT DETECTED Final   Cryptococcus neoformans/gattii NOT DETECTED NOT DETECTED Final   Meth resistant mecA/C and MREJ NOT DETECTED NOT DETECTED Final    Comment: Performed at [facility], [address], [place], [place] [address]         Radiology Studies: No results found.  Scheduled Meds:  [MAR Hold] amLODipine  5 mg Oral BID   [MAR Hold] carvedilol  12.5 mg Oral BID WC   chlorhexidine  15 mL Mouth/Throat Once   Or   mouth rinse  15 mL Mouth Rinse Once   chlorhexidine       [MAR Hold] enoxaparin (LOVENOX) injection  40 mg Subcutaneous Q24H   [MAR Hold] irbesartan  300 mg Oral Daily   [MAR Hold] sodium chloride flush  3 mL Intravenous Q12H   [MAR Hold] trihexyphenidyl  2 mg Oral  TID WC   Continuous Infusions:  [MAR Hold]  ceFAZolin (ANCEF) IV 2 g (05/30/22 0602)   lactated ringers       LOS: 2 days   Time spent: 90mn  Mischele Detter C Saranya Harlin, DO [facility]  If 7PM-7AM, please contact night-coverage [email or web address]  05/30/2022, 8:29 AM

## 2022-05-30 NOTE — Anesthesia Procedure Notes (Signed)
Procedure Name: Intubation Date/Time: 05/30/2022 9:22 AM  Performed by: Bryson Corona, CRNAPre-anesthesia Checklist: Patient identified, Emergency Drugs available, Suction available and Patient being monitored Patient Re-evaluated:Patient Re-evaluated prior to induction Oxygen Delivery Method: Circle System Utilized Preoxygenation: Pre-oxygenation with 100% oxygen Induction Type: IV induction Ventilation: Mask ventilation without difficulty Laryngoscope Size: Mac and 3 Grade View: Grade III Tube type: Oral Tube size: 7.0 mm Number of attempts: 1 Airway Equipment and Method: Stylet Placement Confirmation: ETT inserted through vocal cords under direct vision, positive ETCO2 and breath sounds checked- equal and bilateral Secured at: 22 cm Tube secured with: Tape Dental Injury: Teeth and Oropharynx as per pre-operative assessment  Comments: MRI blade MAC 3. Epiglottis blocking the glottic opening. Grade 3 but Easily passed ETT.

## 2022-05-30 NOTE — Progress Notes (Signed)
Patient arrived to short stay prior to MRI with sedation from 6N30 with 2 necklaces and 8 rings. Jewelry removed, taken to security. Yellow carbon copy placed in pt's chart. ID tag [id number].

## 2022-05-30 NOTE — Transfer of Care (Signed)
Immediate Anesthesia Transfer of Care Note  Patient: Kristin Gilmore  Procedure(s) Performed: MRI WITH ANESTHESIA LUMBAR SPINE W/WO CONSTRAST  Patient Location: PACU  Anesthesia Type:General  Level of Consciousness: awake, alert  and oriented  Airway & Oxygen Therapy: Patient Spontanous Breathing  Post-op Assessment: Report given to RN and Post -op Vital signs reviewed and stable  Post vital signs: Reviewed and stable  Last Vitals:  Vitals Value Taken Time  BP 161/83 05/30/22 1045  Temp    Pulse 90 05/30/22 1056  Resp 15 05/30/22 1056  SpO2 94 % 05/30/22 1056  Vitals shown include unvalidated device data.  Last Pain:  Vitals:   05/30/22 1030  TempSrc:   PainSc: 0-No pain      Patients Stated Pain Goal: 0 (82/70/78 6754)  Complications: No notable events documented.

## 2022-05-31 ENCOUNTER — Inpatient Hospital Stay (HOSPITAL_COMMUNITY): Payer: Medicare Other

## 2022-05-31 ENCOUNTER — Encounter (HOSPITAL_COMMUNITY): Payer: Self-pay | Admitting: Radiology

## 2022-05-31 DIAGNOSIS — M5416 Radiculopathy, lumbar region: Secondary | ICD-10-CM

## 2022-05-31 DIAGNOSIS — B9561 Methicillin susceptible Staphylococcus aureus infection as the cause of diseases classified elsewhere: Secondary | ICD-10-CM | POA: Diagnosis not present

## 2022-05-31 DIAGNOSIS — G20 Parkinson's disease: Secondary | ICD-10-CM | POA: Diagnosis not present

## 2022-05-31 DIAGNOSIS — M7031 Other bursitis of elbow, right elbow: Secondary | ICD-10-CM

## 2022-05-31 DIAGNOSIS — N179 Acute kidney failure, unspecified: Secondary | ICD-10-CM

## 2022-05-31 DIAGNOSIS — R7881 Bacteremia: Secondary | ICD-10-CM | POA: Diagnosis not present

## 2022-05-31 LAB — COMPREHENSIVE METABOLIC PANEL
ALT: 17 U/L (ref 0–44)
AST: 15 U/L (ref 15–41)
Albumin: 2.7 g/dL — ABNORMAL LOW (ref 3.5–5.0)
Alkaline Phosphatase: 77 U/L (ref 38–126)
Anion gap: 11 (ref 5–15)
BUN: 26 mg/dL — ABNORMAL HIGH (ref 8–23)
CO2: 24 mmol/L (ref 22–32)
Calcium: 9 mg/dL (ref 8.9–10.3)
Chloride: 101 mmol/L (ref 98–111)
Creatinine, Ser: 0.92 mg/dL (ref 0.44–1.00)
GFR, Estimated: >60 mL/min (ref >60)
Glucose, Bld: 188 mg/dL — ABNORMAL HIGH (ref 70–99)
Potassium: 4 mmol/L (ref 3.5–5.1)
Sodium: 136 mmol/L (ref 135–145)
Total Bilirubin: 0.3 mg/dL (ref 0.3–1.2)
Total Protein: 6.2 g/dL — ABNORMAL LOW (ref 6.5–8.1)

## 2022-05-31 LAB — CULTURE, BLOOD (ROUTINE X 2)
Special Requests: ADEQUATE
Special Requests: ADEQUATE

## 2022-05-31 LAB — CBC
HCT: 33.1 % — ABNORMAL LOW (ref 36.0–46.0)
Hemoglobin: 11.4 g/dL — ABNORMAL LOW (ref 12.0–15.0)
MCH: 31.5 pg (ref 26.0–34.0)
MCHC: 34.4 g/dL (ref 30.0–36.0)
MCV: 91.4 fL (ref 80.0–100.0)
Platelets: 242 10*3/uL (ref 150–400)
RBC: 3.62 MIL/uL — ABNORMAL LOW (ref 3.87–5.11)
RDW: 12.8 % (ref 11.5–15.5)
WBC: 9.3 10*3/uL (ref 4.0–10.5)
nRBC: 0 % (ref 0.0–0.2)

## 2022-05-31 NOTE — Progress Notes (Signed)
PT Cancellation Note  Patient Details Name: Kristin Gilmore MRN: [id number] DOB: 03/09/1951   Cancelled Treatment:    Reason Eval/Treat Not Completed: Other (comment).  Reattempted and pt did not commit to trying to do therapy today even later.  Will retry as time and pt allow.   Ramond Dial 05/31/2022, 1:56 PM  Mee Hives, PT PhD Acute Rehab Dept. Number: [place] [facility] [place]

## 2022-05-31 NOTE — Progress Notes (Signed)
[facility]  Date of Admission:  05/27/2022           Reason for visit: Follow up on MSSA bacteremia  Current antibiotics: Cefazolin  ASSESSMENT:    71 y.o. female admitted with:  MSSA Bacteremia: Blood cultures from admission are positive on 05/28/2022.  TTE unable to be done due to her combativeness noted on report.  Repeat blood cultures 05/30/22 NGTD. Severe back pain: In the setting of prior lumbar spine surgery with instrumentation in January 2023.  Back pain has been recalcitrant to selective nerve block in August 2023.  MRI with contrast 05/30/22 was fortunately negative for an obvious infectious source.  Possible right elbow bursitis: Concerning for the possibility of septic bursitis in the setting of MSSA bacteremia.  Cannot exclude septic arthritis given her bacteremia.  Imaging is pending.  Acute kidney injury: Resolved with IV fluids. Parkinson's disease.   RECOMMENDATIONS:    Continue cefazolin  Recommend TEE Await imaging of her right elbow Lab monitoring Following.  Dr Gale Journey here as needed over the weekend.  I will be back on Tuesday.    Principal Problem:   Bacteremia due to methicillin susceptible Staphylococcus aureus (MSSA) Active Problems:   Lumbar spinal stenosis   Parkinson's disease (HCC)   Intractable back pain   SIRS (systemic inflammatory response syndrome) (HCC)   Bipolar disorder, in partial remission, most recent episode hypomanic (HCC)   Hyperkalemia   Dehydration   Essential hypertension    MEDICATIONS:    Scheduled Meds:  amLODipine  5 mg Oral BID   carvedilol  12.5 mg Oral BID WC   enoxaparin (LOVENOX) injection  40 mg Subcutaneous Q24H   irbesartan  300 mg Oral Daily   sodium chloride flush  3 mL Intravenous Q12H   trihexyphenidyl  2 mg Oral TID WC   Continuous Infusions:   ceFAZolin (ANCEF) IV 2 g (05/31/22 0606)   PRN Meds:.acetaminophen **OR** acetaminophen, albuterol, ALPRAZolam,  HYDROcodone-acetaminophen, HYDROmorphone, ondansetron  SUBJECTIVE:   24 hour events:  Patient underwent MRI with sedation yesterday which fortunately was negative for any findings to explain her worsening back pain and did not show any evidence of infection Afebrile Tmax 98.3 Repeat blood cultures drawn yesterday no growth TTE yesterday not really completed as the patient became combative Imaging of her right elbow is pending I ordered a CT scan, orthopedics ordered an x-ray WBC normalized, CMP pending this morning  She is on the commode. About to go down for x-rays of hip, elbow, humerus.   Review of Systems  All other systems reviewed and are negative.     OBJECTIVE:   Blood pressure (!) 154/113, pulse 85, temperature 98 F (36.7 C), temperature source Oral, resp. rate 18, height '5\' 7"'$  (1.702 m), weight 73.5 kg, SpO2 96 %. Body mass index is 25.37 kg/m.  Physical Exam Constitutional:      General: She is not in acute distress.    Appearance: Normal appearance.     Comments: She is sitting on the bedside toilet.   HENT:     Head: Normocephalic and atraumatic.  Eyes:     Extraocular Movements: Extraocular movements intact.     Conjunctiva/sclera: Conjunctivae normal.  Musculoskeletal:     Cervical back: Normal range of motion and neck supple.  Skin:    General: Skin is warm and dry.     Findings: No rash.  Neurological:     General: No focal deficit present.  Mental Status: She is alert and oriented to person, place, and time.  Psychiatric:        Mood and Affect: Mood normal.        Behavior: Behavior normal.      Lab Results: Lab Results  Component Value Date   WBC 9.3 05/31/2022   HGB 11.4 (L) 05/31/2022   HCT 33.1 (L) 05/31/2022   MCV 91.4 05/31/2022   PLT 242 05/31/2022    Lab Results  Component Value Date   NA 135 05/30/2022   K 4.3 05/30/2022   CO2 25 05/30/2022   GLUCOSE 165 (H) 05/30/2022   BUN 17 05/30/2022   CREATININE 1.00 05/30/2022    CALCIUM 8.7 (L) 05/30/2022   GFRNONAA 60 (L) 05/30/2022   GFRAA >60 12/20/2017    Lab Results  Component Value Date   ALT 20 05/30/2022   AST 18 05/30/2022   ALKPHOS 73 05/30/2022   BILITOT 0.5 05/30/2022       Component Value Date/Time   CRP 32.1 (H) 05/28/2022 1613       Component Value Date/Time   ESRSEDRATE 41 (H) 05/28/2022 1613     I have reviewed the micro and lab results in Epic.  Imaging: ECHOCARDIOGRAM COMPLETE  Result Date: 05/30/2022    ECHOCARDIOGRAM REPORT   Patient Name:   Kristin Gilmore Date of Exam: 05/30/2022 Medical Rec #:  [id number]            Height:       67.0 in Accession #:    [id number]           Weight:       162.0 lb Date of Birth:  07-Oct-1950             BSA:          1.849 m Patient Age:    12 years             BP:           124/88 mmHg Patient Gender: F                    HR:           84 bpm. Exam Location:  Inpatient Procedure: 2D Echo, Color Doppler and Cardiac Doppler Indications:    Bacteremia  History:        Patient has prior history of Echocardiogram examinations, most                 recent 11/23/2015. Risk Factors:Hypertension and Dyslipidemia.  Sonographer:    Memory Argue Referring Phys: [id number] Jaconita  1. Study was not completed as the patient became combative.  2. Left ventricular ejection fraction, by estimation, is 60 to 65%. The left ventricle has normal function. The left ventricle has no regional wall motion abnormalities. Left ventricular diastolic function could not be evaluated.  3. Right ventricular systolic function is normal. The right ventricular size is normal.  4. Left atrial size was severely dilated.  5. The mitral valve is normal in structure. Mild mitral valve regurgitation. No evidence of mitral stenosis.  6. The aortic valve is tricuspid. Aortic valve regurgitation is not visualized. No aortic stenosis is present. FINDINGS  Left Ventricle: Left ventricular ejection fraction, by estimation, is 60  to 65%. The left ventricle has normal function. The left ventricle has no regional wall motion abnormalities. The left ventricular internal cavity size was normal in size. There is  no left  ventricular hypertrophy. Left ventricular diastolic function could not be evaluated. Right Ventricle: The right ventricular size is normal. No increase in right ventricular wall thickness. Right ventricular systolic function is normal. Left Atrium: Left atrial size was severely dilated. Right Atrium: Right atrial size was normal in size. Pericardium: There is no evidence of pericardial effusion. Mitral Valve: The mitral valve is normal in structure. Mild mitral valve regurgitation. No evidence of mitral valve stenosis. Tricuspid Valve: The tricuspid valve is normal in structure. Tricuspid valve regurgitation is not demonstrated. No evidence of tricuspid stenosis. Aortic Valve: The aortic valve is tricuspid. Aortic valve regurgitation is not visualized. No aortic stenosis is present. Pulmonic Valve: The pulmonic valve was normal in structure. Pulmonic valve regurgitation is not visualized. No evidence of pulmonic stenosis. Aorta: The aortic root is normal in size and structure. Venous: The inferior vena cava was not well visualized. IAS/Shunts: No atrial level shunt detected by color flow Doppler.  LEFT VENTRICLE PLAX 2D LVIDd:         5.40 cm LVIDs:         3.60 cm LV PW:         0.90 cm LV IVS:        0.80 cm LVOT diam:     2.00 cm LVOT Area:     3.14 cm  LEFT ATRIUM           Index LA diam:      3.90 cm 2.11 cm/m LA Vol (A4C): 77.5 ml 41.91 ml/m   AORTA Ao Root diam: 2.60 cm MITRAL VALVE MV Area (PHT): 4.21 cm     SHUNTS MV Decel Time: 180 msec     Systemic Diam: 2.00 cm MV E velocity: 124.00 cm/s MV A velocity: 104.00 cm/s MV E/A ratio:  1.19 Skeet Latch MD Electronically signed by Skeet Latch MD Signature Date/Time: 05/30/2022/7:26:46 PM    Final    MR Lumbar Spine W Wo Contrast  Result Date:  05/30/2022 CLINICAL DATA:  Low back pain with bilateral leg pain and weakness, history of posterior fusion at L4-L5 10/30/2021. EXAM: MRI LUMBAR SPINE WITHOUT AND WITH CONTRAST TECHNIQUE: Multiplanar and multiecho pulse sequences of the lumbar spine were obtained without and with intravenous contrast. CONTRAST:  68m GADAVIST GADOBUTROL 1 MMOL/ML IV SOLN COMPARISON:  CT lumbar spine 04/22/2022, lumbar spine MRI 07/18/2021 FINDINGS: Segmentation: Standard; the lowest formed disc space is designated L5-S1. Alignment: Levocurvature centered at L2 is unchanged. Trace anterolisthesis of L3 on L4 and 5 mm anterolisthesis of L4 on L5 is unchanged. Vertebrae: Postsurgical changes reflecting posterior instrumented fusion at L3 through L5 with interbody spacers are again seen. Lucency around the L5 screws seen on the prior CT is not appreciated by MRI. Background marrow signal is within normal limits. There is degenerative endplate marrow signal abnormality with edema but no enhancement at L2-L3 which is increased since the preoperative study from 2022. There is no other marrow edema. There is no abnormal marrow enhancement. Conus medullaris and cauda equina: Conus extends to the L1 level. Conus and cauda equina appear normal. There is no abnormal enhancement of the conus or cauda equina nerve roots. Paraspinal and other soft tissues: There are postsurgical changes in the soft tissues posterior to the surgical levels. There is no suspicious fluid collection. The endometrium is distended measuring up to 8 mm in thickness. Disc levels: T12-L1: No significant spinal canal or neural foraminal stenosis L1-L2: No significant spinal canal or neural foraminal stenosis. L2-L3: There is disc desiccation  and narrowing with a diffuse disc bulge and small superiorly migrated extrusion, and moderate bilateral facet arthropathy resulting in moderate spinal canal stenosis with subarticular zone narrowing and possible irritation of either  traversing L3 nerve root, and no significant neural foraminal stenosis. Compared to the preoperative study from 2022, the spinal canal stenosis has worsened. L3-L4: Status post posterior instrumented fusion and decompression. There is no residual spinal canal stenosis. There is mild residual right worse than left neural foraminal stenosis without convincing evidence of nerve root compression. L4-L5: Status post posterior instrumented fusion and decompression. There is grade 1 anterolisthesis with uncovering of the disc posteriorly and bilateral facet arthropathy. There is no residual spinal canal stenosis; however, there is severe left and mild-to-moderate right neural foraminal stenosis with compression of the exiting left L4 nerve root (5-11). L5-S1: There is advanced disc desiccation and narrowing with a disc bulge and central protrusion and annular fissure, moderate bilateral facet arthropathy resulting in severe left worse than right neural foraminal stenosis without significant spinal canal stenosis. There is no evidence of compression of the traversing S1 nerve roots as was questioned on prior CT. IMPRESSION: 1. Status post posterior instrumented fusion and decompression at L3 through L5 without residual spinal canal stenosis at the surgical levels; however, there is severe left and mild-to-moderate right neural foraminal stenosis at L4-L5 with compression of the exiting left L4 nerve root. No other convincing nerve root compression at the surgical levels. 2. 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 3. Unchanged advanced disc degeneration and moderate facet arthropathy at L5-S1 with severe left worse than right neural foraminal stenosis. No evidence of compression of the traversing S1 nerve roots as was questioned  on prior CT. 4. Distended endometrium measuring up to 8 mm. Recommend correlation with pelvic ultrasound given patient age. Electronically Signed   By: Valetta Mole M.D.   On: 05/30/2022 11:16     Imaging independently reviewed in Epic.    [facility] [phone] pager 05/31/2022, 8:22 AM

## 2022-05-31 NOTE — Progress Notes (Signed)
Patient keeps trying to get out of bed. Educated patient on need to stay in bed and call for assistance due to her being a major fall risk. Patient being rude and yelling and refusing to follow orders to place her legs back in bed . Mats at bedside, call light in reach, bed alarm on, bed in lowest position. Will make MD aware

## 2022-05-31 NOTE — Progress Notes (Signed)
    [facility] has been requested to perform a transesophageal echocardiogram on Kristin Gilmore for bacteremia. Per chart review, patient has a past medical history of HTN, HLD, prediabetes, anxiety, depression, Bipolar disorder who presented on 8/29 complaining of worsening back pain with radiation down the left leg. She had surgery on her lumbar spine in 09/2021, and has been trying to arrange a nerve block that has not been completed due to insurance delays. In the ED, patient was noted to be febrile, tachycardic and tachypneic. Blood cultures were drawn 8/29 and returned positive for MSSA from an unclear source. Repeat cultures on 8/31 show no growth to date. Patient has possible right elbow bursitis, imaging is pending. Echo was ordered, but was unable to be done due to combativeness.  After careful review of history and examination, the risks and benefits of transesophageal echocardiogram have been explained including risks of esophageal damage, perforation (1:10,000 risk), bleeding, pharyngeal hematoma as well as other potential complications associated with conscious sedation including aspiration, arrhythmia, respiratory failure and death. Alternatives to treatment were discussed, questions were answered. Patient is willing to proceed.   Margie Billet, PA-C 05/31/2022 3:15 PM

## 2022-05-31 NOTE — Progress Notes (Signed)
Patient arrived back to [place] room 30 alert and oriented, bed in lowest position, call light in reach, bed alarm on. Will continue to monitor patient.

## 2022-05-31 NOTE — Progress Notes (Signed)
Patient ID: Kristin Gilmore, female   DOB: 1951-09-18, 71 y.o.   MRN: [id number] Results of radiographs of the right humerus show a Soft tissue mass over the right distal posterior upper arm in the area of the distal triceps. 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  A CT scan is recommended to further define the mass and assess its size. The right elbow has soft tissue swelling over the area of the proximal posterior ulna in the area of the olecranon bursa. The normal fat planes are Lost due to soft tissue edema and induration. Left hip radiographs show no acute findings.

## 2022-05-31 NOTE — Progress Notes (Signed)
Patient refused CT tonight, she said she wants to do in the morning

## 2022-05-31 NOTE — Care Plan (Signed)
Transport went to get pt. For CT of Elbow and Pt. Refused exam.

## 2022-05-31 NOTE — Progress Notes (Signed)
PT Cancellation Note  Patient Details Name: Kristin Gilmore MRN: [id number] DOB: 24-Jul-1951   Cancelled Treatment:    Reason Eval/Treat Not Completed: Other (comment).  Refusing therapy because her pain is controlled, does not want to move now.  Retry at another time.   Ramond Dial 05/31/2022, 11:12 AM  Mee Hives, PT PhD Acute Rehab Dept. Number: [place] [facility] [place]

## 2022-05-31 NOTE — Progress Notes (Signed)
PROGRESS NOTE    [name]  TFT:[id number] DOB: 08/12/1951 DOA: 05/27/2022 PCP: [facility] [place]   Brief Narrative:  Kristin Gilmore is a 71 y.o. female with medical history significant of hypertension, dyslipidemia, prediabetes, anxiety, depression who presents with complaints of worsening back pain, denies trauma. Patient reports having severe pain in her lower back with radiation down the left leg. Sees Dr. Louanne Skye and has issues with chronic pain in this area.  They were trying to arrange for her to have an outpatient nerve block, but have been waiting on insurance approval.  She reports that she last had surgery of her lumbar spine back in January of this year.  She denies having any significant fevers, chills, saddle anesthesia, or  incontinence.  Assessment & Plan:   Principal Problem:   Bacteremia due to methicillin susceptible Staphylococcus aureus (MSSA) Active Problems:   Intractable back pain   SIRS (systemic inflammatory response syndrome) (HCC)   Lumbar spinal stenosis   Parkinson's disease (HCC)   Bipolar disorder, in partial remission, most recent episode hypomanic (HCC)   Hyperkalemia   Dehydration   Essential hypertension   Sepsis secondary to MSSA bacteremia, POA Unclear source -Single febrile event at intake -blood cultures growing MSSA, ID consulted per protocol -Continue cefazolin -Multiple imaging looking for source of infection thus far been unremarkable, including MRI of the lumbar spine, plain film of right elbow pelvis and hips and right humerus. -CT of left elbow pending -ESR 41, CRP 32, lactic acid normal, INR normal -TTE performed previously although patient became somewhat combative and the test was not completed, cardiology able to read what imaging was taken without any overt findings of valvular abnormalities. -Lengthy discussion today at bedside with cardiology about need for TEE, patient markedly combative,  yelling that she would prefer to talk to her orthopedic surgeon despite recommendations from both myself and infectious disease.  Ultimately patient did agree to TEE which unfortunately cannot be done until next week.  Patient has high risk for refusing this procedure once it becomes available, as such would likely need to treat patient for 6 weeks at minimum for presumed endocarditis given no other source for infection in the setting of MSSA.   Profound noncompliance  -Patient continues to threaten staff "I will throw myself into the floor" when asked to participate in care or take medications that she is not willing to participate in or take respectively. -Patient became combative when attempting to complete echocardiogram -Patient continues to refuse care at bedside per nursing and other staff. -Lengthy discussion with patient about need for compliance -which includes procedures, bathing, vital signs, labs as well as medications.  Acute hyperkalemia, resolved Dehydration/AKI, resolved Resolved with IV fluid and ultimately increased p.o. intake   Essential hypertension -Currently well controlled on home regimen- amlodipine carvedilol telmisartan -Concern that uncontrolled hypertension at intake was in the setting of medication noncompliance at home -Furosemide on hold given above AKI -resume in the next 24 hours   Anxiety and depression -Currently on Xanax only, med rec pending for patient's chronic medications -Current unconfirmed medications listed include amitriptyline, Seroquel, Effexor   Prediabetes Last hemoglobin A1c 5.8 on 06/22/2021.   Home medication regimen appears to include glipizide.   Parkinson's disease Home medication regimen includes Artane 2 mg 3 times daily.  Lower back pain; acute on chronic, chronic history of lumbar stenosis with radiculopathy, POA - Acute on chronic pain without deficits or paresthesias. - Initial plan for neurology evaluation  and potential  nerve block however this appears to have been done previously this month with minimal to no improvement, as such will not be repeated per documentation given limited therapeutics but increased risk of complications. -MRI without overt findings to explain worsening back pain - discussed with ortho -Continue home pain meds including hydrocodone and p.o. Dilaudid - appears to be moderately well controlled at this time, not pain-free but again near her baseline. -Dr. Louanne Skye orthopedics following, appreciate insight and recommendations - likely require outpatient follow up given no indication for procedure/intervention at this time  DVT prophylaxis: Lovenox Code Status: Full Family Communication: None present  Status is: Inpatient  Dispo: The patient is from: Home              Anticipated d/c is to: Home              Anticipated d/c date is: 06/05/22              Patient currently not medically stable for discharge  Consultants:  Orthopedic surgery, cardiology, infectious disease  Procedures:  None  Antimicrobials:  Resolving  Subjective: No acute issues or events overnight  Objective: Vitals:   05/30/22 1115 05/30/22 1130 05/30/22 1702 05/30/22 2101  BP: (!) 157/63 124/88 (!) 115/57 (!) 154/113  Pulse: 78 80 77 85  Resp: '17 19 17 18  ' Temp:   98.3 F (36.8 C) 98 F (36.7 C)  TempSrc:   Oral Oral  SpO2: 90% 90% 94% 96%  Weight:      Height:        Intake/Output Summary (Last 24 hours) at 05/31/2022 0813 Last data filed at 05/30/2022 1700 Gross per 24 hour  Intake 800 ml  Output 650 ml  Net 150 ml    Filed Weights   05/27/22 0757 05/30/22 0806  Weight: 72.6 kg 73.5 kg    Examination:  General:  Pleasantly resting in bed, No acute distress. HEENT:  Normocephalic atraumatic.  Sclerae nonicteric, noninjected.  Extraocular movements intact bilaterally. Neck:  Without mass or deformity.  Trachea is midline. Abdomen: Nondistended Extremities: Without cyanosis, clubbing,  edema, or obvious deformity. Vascular:  Dorsalis pedis and posterior tibial pulses palpable bilaterally. Skin:  Warm and dry, no erythema, no ulcerations.  Data Reviewed: I have personally reviewed following labs and imaging studies  CBC: Recent Labs  Lab 05/27/22 1046 05/28/22 1038 05/29/22 1330 05/30/22 0621 05/31/22 0737  WBC 12.7* 16.8* 12.2* 12.1* 9.3  NEUTROABS 10.3* 13.3*  --   --   --   HGB 12.6 12.1 10.6* 10.9* 11.4*  HCT 38.3 35.5* 31.1* 31.9* 33.1*  MCV 94.3 92.4 92.6 92.7 91.4  PLT 227 219 173 213 032    Basic Metabolic Panel: Recent Labs  Lab 05/27/22 1046 05/28/22 1038 05/30/22 0621  NA 136 132* 135  K 5.3* 4.2 4.3  CL 99 101 101  CO2 '28 23 25  ' GLUCOSE 118* 130* 165*  BUN 42* 24* 17  CREATININE 1.07* 0.88 1.00  CALCIUM 9.1 8.5* 8.7*    GFR: Estimated Creatinine Clearance: 49.5 mL/min (by C-G formula based on SCr of 1 mg/dL). Liver Function Tests: Recent Labs  Lab 05/28/22 1038 05/30/22 0621  AST 26 18  ALT 28 20  ALKPHOS 75 73  BILITOT 0.6 0.5  PROT 6.3* 6.1*  ALBUMIN 3.3* 2.7*    No results for input(s): "LIPASE", "AMYLASE" in the last 168 hours. No results for input(s): "AMMONIA" in the last 168 hours. Coagulation Profile: Recent Labs  Lab 05/28/22 1038  INR 1.2    Cardiac Enzymes: No results for input(s): "CKTOTAL", "CKMB", "CKMBINDEX", "TROPONINI" in the last 168 hours. BNP (last 3 results) No results for input(s): "PROBNP" in the last 8760 hours. HbA1C: No results for input(s): "HGBA1C" in the last 72 hours. CBG: No results for input(s): "GLUCAP" in the last 168 hours. Lipid Profile: No results for input(s): "CHOL", "HDL", "LDLCALC", "TRIG", "CHOLHDL", "LDLDIRECT" in the last 72 hours. Thyroid Function Tests: No results for input(s): "TSH", "T4TOTAL", "FREET4", "T3FREE", "THYROIDAB" in the last 72 hours. Anemia Panel: No results for input(s): "VITAMINB12", "FOLATE", "FERRITIN", "TIBC", "IRON", "RETICCTPCT" in the last 72  hours. Sepsis Labs: Recent Labs  Lab 05/28/22 1038 05/28/22 1231  LATICACIDVEN 1.0 0.9     Recent Results (from the past 240 hour(s))  Resp Panel by RT-PCR (Flu A&B, Covid) Urine, Clean Catch     Status: None   Collection Time: 05/27/22  8:11 PM   Specimen: Urine, Clean Catch; Nasal Swab  Result Value Ref Range Status   SARS Coronavirus 2 by RT PCR NEGATIVE NEGATIVE Final    Comment: (NOTE) SARS-CoV-2 target nucleic acids are NOT DETECTED.  The SARS-CoV-2 RNA is generally detectable in upper respiratory specimens during the acute phase of infection. The lowest concentration of SARS-CoV-2 viral copies this assay can detect is 138 copies/mL. A negative result does not preclude SARS-Cov-2 infection and should not be used as the sole basis for treatment or other patient management decisions. A negative result may occur with  improper specimen collection/handling, submission of specimen other than nasopharyngeal swab, presence of viral mutation(s) within the areas targeted by this assay, and inadequate number of viral copies(<138 copies/mL). A negative result must be combined with clinical observations, patient history, and epidemiological information. The expected result is Negative.  Fact Sheet for Patients:  EntrepreneurPulse.com.au  Fact Sheet for Healthcare Providers:  IncredibleEmployment.be  This test is no t yet approved or cleared by the [place] FDA and  has been authorized for detection and/or diagnosis of SARS-CoV-2 by FDA under an Emergency Use Authorization (EUA). This EUA will remain  in effect (meaning this test can be used) for the duration of the COVID-19 declaration under Section 564(b)(1) of the Act, 21 U.S.C.section 360bbb-3(b)(1), unless the authorization is terminated  or revoked sooner.       Influenza A by PCR NEGATIVE NEGATIVE Final   Influenza B by PCR NEGATIVE NEGATIVE Final    Comment: (NOTE) The Xpert  Xpress SARS-CoV-2/FLU/RSV plus assay is intended as an aid in the diagnosis of influenza from Nasopharyngeal swab specimens and should not be used as a sole basis for treatment. Nasal washings and aspirates are unacceptable for Xpert Xpress SARS-CoV-2/FLU/RSV testing.  Fact Sheet for Patients: EntrepreneurPulse.com.au  Fact Sheet for Healthcare Providers: IncredibleEmployment.be  This test is not yet approved or cleared by the [place] FDA and has been authorized for detection and/or diagnosis of SARS-CoV-2 by FDA under an Emergency Use Authorization (EUA). This EUA will remain in effect (meaning this test can be used) for the duration of the COVID-19 declaration under Section 564(b)(1) of the Act, 21 U.S.C. section 360bbb-3(b)(1), unless the authorization is terminated or revoked.  Performed at [facility], [address] [place], [place] [address]   Culture, blood (x 2)     Status: Abnormal   Collection Time: 05/28/22 10:38 AM   Specimen: BLOOD RIGHT ARM  Result Value Ref Range Status   Specimen Description BLOOD RIGHT ARM  Final  Special Requests   Final    BOTTLES DRAWN AEROBIC AND ANAEROBIC Blood Culture adequate volume   Culture  Setup Time   Final    GRAM POSITIVE COCCI IN CLUSTERS BOTTLES DRAWN AEROBIC ONLY CRITICAL RESULT CALLED TO, READ BACK BY AND VERIFIED WITH: PHARMD FRedmond Pulling [phone] '@1638'  FH    Culture (A)  Final    STAPHYLOCOCCUS AUREUS SUSCEPTIBILITIES PERFORMED ON PREVIOUS CULTURE WITHIN THE LAST 5 DAYS. Performed at [facility], [place] [address], [place], [place] [address]    Report Status 05/31/2022 FINAL  Final  Culture, blood (x 2)     Status: Abnormal   Collection Time: 05/28/22 10:41 AM   Specimen: BLOOD LEFT ARM  Result Value Ref Range Status   Specimen Description BLOOD LEFT ARM  Final   Special Requests   Final    BOTTLES DRAWN AEROBIC AND ANAEROBIC Blood Culture adequate  volume   Culture  Setup Time   Final    GRAM POSITIVE COCCI ANAEROBIC BOTTLE ONLY CRITICAL RESULT CALLED TO, READ BACK BY AND VERIFIED WITH: PHARMD C DAVIS [id number] AT 1406 BY CM Performed at [facility], [place] [address] [place], [place] [address]    Culture STAPHYLOCOCCUS AUREUS (A)  Final   Report Status 05/31/2022 FINAL  Final   Organism ID, Bacteria STAPHYLOCOCCUS AUREUS  Final      Susceptibility   Staphylococcus aureus - MIC*    CIPROFLOXACIN <=0.5 SENSITIVE Sensitive     ERYTHROMYCIN 0.5 SENSITIVE Sensitive     GENTAMICIN <=0.5 SENSITIVE Sensitive     OXACILLIN 0.5 SENSITIVE Sensitive     TETRACYCLINE <=1 SENSITIVE Sensitive     VANCOMYCIN 1 SENSITIVE Sensitive     TRIMETH/SULFA <=10 SENSITIVE Sensitive     CLINDAMYCIN <=0.25 SENSITIVE Sensitive     RIFAMPIN <=0.5 SENSITIVE Sensitive     Inducible Clindamycin NEGATIVE Sensitive     * STAPHYLOCOCCUS AUREUS  Blood Culture ID Panel (Reflexed)     Status: Abnormal   Collection Time: 05/28/22 10:41 AM  Result Value Ref Range Status   Enterococcus faecalis NOT DETECTED NOT DETECTED Final   Enterococcus Faecium NOT DETECTED NOT DETECTED Final   Listeria monocytogenes NOT DETECTED NOT DETECTED Final   Staphylococcus species DETECTED (A) NOT DETECTED Final    Comment: CRITICAL RESULT CALLED TO, READ BACK BY AND VERIFIED WITH: PHARMD C DAVID [phone] AT 1406 BY CM    Staphylococcus aureus (BCID) DETECTED (A) NOT DETECTED Final    Comment: CRITICAL RESULT CALLED TO, READ BACK BY AND VERIFIED WITH: PHARMD C DAVIS [id number] AT 1406 BY CM    Staphylococcus epidermidis NOT DETECTED NOT DETECTED Final   Staphylococcus lugdunensis NOT DETECTED NOT DETECTED Final   Streptococcus species NOT DETECTED NOT DETECTED Final   Streptococcus agalactiae NOT DETECTED NOT DETECTED Final   Streptococcus pneumoniae NOT DETECTED NOT DETECTED Final   Streptococcus pyogenes NOT DETECTED NOT DETECTED Final   A.calcoaceticus-baumannii NOT DETECTED NOT  DETECTED Final   Bacteroides fragilis NOT DETECTED NOT DETECTED Final   Enterobacterales NOT DETECTED NOT DETECTED Final   Enterobacter cloacae complex NOT DETECTED NOT DETECTED Final   Escherichia coli NOT DETECTED NOT DETECTED Final   Klebsiella aerogenes NOT DETECTED NOT DETECTED Final   Klebsiella oxytoca NOT DETECTED NOT DETECTED Final   Klebsiella pneumoniae NOT DETECTED NOT DETECTED Final   Proteus species NOT DETECTED NOT DETECTED Final   Salmonella species NOT DETECTED NOT DETECTED Final   Serratia marcescens NOT DETECTED NOT DETECTED Final  Haemophilus influenzae NOT DETECTED NOT DETECTED Final   Neisseria meningitidis NOT DETECTED NOT DETECTED Final   Pseudomonas aeruginosa NOT DETECTED NOT DETECTED Final   Stenotrophomonas maltophilia NOT DETECTED NOT DETECTED Final   Candida albicans NOT DETECTED NOT DETECTED Final   Candida auris NOT DETECTED NOT DETECTED Final   Candida glabrata NOT DETECTED NOT DETECTED Final   Candida krusei NOT DETECTED NOT DETECTED Final   Candida parapsilosis NOT DETECTED NOT DETECTED Final   Candida tropicalis NOT DETECTED NOT DETECTED Final   Cryptococcus neoformans/gattii NOT DETECTED NOT DETECTED Final   Meth resistant mecA/C and MREJ NOT DETECTED NOT DETECTED Final    Comment: Performed at [facility], [place] [address], [place], [place] [address]  Culture, blood (Routine X 2) w Reflex to ID Panel     Status: None (Preliminary result)   Collection Time: 05/30/22  6:21 AM   Specimen: BLOOD  Result Value Ref Range Status   Specimen Description BLOOD RIGHT ANTECUBITAL  Final   Special Requests   Final    BOTTLES DRAWN AEROBIC AND ANAEROBIC Blood Culture adequate volume   Culture   Final    NO GROWTH 1 DAY Performed at [facility], [place] [address], [place], [place] [address]    Report Status PENDING  Incomplete  Culture, blood (Routine X 2) w Reflex to ID Panel     Status: None (Preliminary result)   Collection Time: 05/30/22  6:26  AM   Specimen: BLOOD RIGHT HAND  Result Value Ref Range Status   Specimen Description BLOOD RIGHT HAND  Final   Special Requests   Final    BOTTLES DRAWN AEROBIC AND ANAEROBIC Blood Culture adequate volume   Culture   Final    NO GROWTH 1 DAY Performed at [facility], [place] [address] [place], [place] [address]    Report Status PENDING  Incomplete         Radiology Studies: ECHOCARDIOGRAM COMPLETE  Result Date: 05/30/2022    ECHOCARDIOGRAM REPORT   Patient Name:   Kristin Gilmore Date of Exam: 05/30/2022 Medical Rec #:  [id number]            Height:       67.0 in Accession #:    [id number]           Weight:       162.0 lb Date of Birth:  [birth year]             BSA:          1.849 m Patient Age:    79 years             BP:           124/88 mmHg Patient Gender: F                    HR:           84 bpm. Exam Location:  Inpatient Procedure: 2D Echo, Color Doppler and Cardiac Doppler Indications:    Bacteremia  History:        Patient has prior history of Echocardiogram examinations, most                 recent 11/23/2015. Risk Factors:Hypertension and Dyslipidemia.  Sonographer:    Memory Argue Referring Phys: [id number] [place]  1. Study was not completed as the patient became combative.  2. Left ventricular ejection fraction, by estimation, is 60 to 65%.  The left ventricle has normal function. The left ventricle has no regional wall motion abnormalities. Left ventricular diastolic function could not be evaluated.  3. Right ventricular systolic function is normal. The right ventricular size is normal.  4. Left atrial size was severely dilated.  5. The mitral valve is normal in structure. Mild mitral valve regurgitation. No evidence of mitral stenosis.  6. The aortic valve is tricuspid. Aortic valve regurgitation is not visualized. No aortic stenosis is present. FINDINGS  Left Ventricle: Left ventricular ejection fraction, by estimation, is 60 to 65%. The left ventricle  has normal function. The left ventricle has no regional wall motion abnormalities. The left ventricular internal cavity size was normal in size. There is  no left ventricular hypertrophy. Left ventricular diastolic function could not be evaluated. Right Ventricle: The right ventricular size is normal. No increase in right ventricular wall thickness. Right ventricular systolic function is normal. Left Atrium: Left atrial size was severely dilated. Right Atrium: Right atrial size was normal in size. Pericardium: There is no evidence of pericardial effusion. Mitral Valve: The mitral valve is normal in structure. Mild mitral valve regurgitation. No evidence of mitral valve stenosis. Tricuspid Valve: The tricuspid valve is normal in structure. Tricuspid valve regurgitation is not demonstrated. No evidence of tricuspid stenosis. Aortic Valve: The aortic valve is tricuspid. Aortic valve regurgitation is not visualized. No aortic stenosis is present. Pulmonic Valve: The pulmonic valve was normal in structure. Pulmonic valve regurgitation is not visualized. No evidence of pulmonic stenosis. Aorta: The aortic root is normal in size and structure. Venous: The inferior vena cava was not well visualized. IAS/Shunts: No atrial level shunt detected by color flow Doppler.  LEFT VENTRICLE PLAX 2D LVIDd:         5.40 cm LVIDs:         3.60 cm LV PW:         0.90 cm LV IVS:        0.80 cm LVOT diam:     2.00 cm LVOT Area:     3.14 cm  LEFT ATRIUM           Index LA diam:      3.90 cm 2.11 cm/m LA Vol (A4C): 77.5 ml 41.91 ml/m   AORTA Ao Root diam: 2.60 cm MITRAL VALVE MV Area (PHT): 4.21 cm     SHUNTS MV Decel Time: 180 msec     Systemic Diam: 2.00 cm MV E velocity: 124.00 cm/s MV A velocity: 104.00 cm/s MV E/A ratio:  1.19 Skeet Latch MD Electronically signed by Skeet Latch MD Signature Date/Time: 05/30/2022/7:26:46 PM    Final    MR Lumbar Spine W Wo Contrast  Result Date: 05/30/2022 CLINICAL DATA:  Low back pain  with bilateral leg pain and weakness, history of posterior fusion at L4-L5 10/30/2021. EXAM: MRI LUMBAR SPINE WITHOUT AND WITH CONTRAST TECHNIQUE: Multiplanar and multiecho pulse sequences of the lumbar spine were obtained without and with intravenous contrast. CONTRAST:  36m GADAVIST GADOBUTROL 1 MMOL/ML IV SOLN COMPARISON:  CT lumbar spine 04/22/2022, lumbar spine MRI 07/18/2021 FINDINGS: Segmentation: Standard; the lowest formed disc space is designated L5-S1. Alignment: Levocurvature centered at L2 is unchanged. Trace anterolisthesis of L3 on L4 and 5 mm anterolisthesis of L4 on L5 is unchanged. Vertebrae: Postsurgical changes reflecting posterior instrumented fusion at L3 through L5 with interbody spacers are again seen. Lucency around the L5 screws seen on the prior CT is not appreciated by MRI. Background marrow signal is within normal  limits. There is degenerative endplate marrow signal abnormality with edema but no enhancement at L2-L3 which is increased since the preoperative study from 2022. There is no other marrow edema. There is no abnormal marrow enhancement. Conus medullaris and cauda equina: Conus extends to the L1 level. Conus and cauda equina appear normal. There is no abnormal enhancement of the conus or cauda equina nerve roots. Paraspinal and other soft tissues: There are postsurgical changes in the soft tissues posterior to the surgical levels. There is no suspicious fluid collection. The endometrium is distended measuring up to 8 mm in thickness. Disc levels: T12-L1: No significant spinal canal or neural foraminal stenosis L1-L2: No significant spinal canal or neural foraminal stenosis. L2-L3: There is disc desiccation and narrowing with a diffuse disc bulge and small superiorly migrated extrusion, and moderate bilateral facet arthropathy resulting in moderate spinal canal stenosis with subarticular zone narrowing and possible irritation of either traversing L3 nerve root, and no significant  neural foraminal stenosis. Compared to the preoperative study from 2022, the spinal canal stenosis has worsened. L3-L4: Status post posterior instrumented fusion and decompression. There is no residual spinal canal stenosis. There is mild residual right worse than left neural foraminal stenosis without convincing evidence of nerve root compression. L4-L5: Status post posterior instrumented fusion and decompression. There is grade 1 anterolisthesis with uncovering of the disc posteriorly and bilateral facet arthropathy. There is no residual spinal canal stenosis; however, there is severe left and mild-to-moderate right neural foraminal stenosis with compression of the exiting left L4 nerve root (5-11). L5-S1: There is advanced disc desiccation and narrowing with a disc bulge and central protrusion and annular fissure, moderate bilateral facet arthropathy resulting in severe left worse than right neural foraminal stenosis without significant spinal canal stenosis. There is no evidence of compression of the traversing S1 nerve roots as was questioned on prior CT. IMPRESSION: 1. Status post posterior instrumented fusion and decompression at L3 through L5 without residual spinal canal stenosis at the surgical levels; however, there is severe left and mild-to-moderate right neural foraminal stenosis at L4-L5 with compression of the exiting left L4 nerve root. No other convincing nerve root compression at the surgical levels. 2. 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 3. Unchanged advanced disc degeneration and moderate facet arthropathy at L5-S1 with severe left worse than right neural foraminal stenosis. No evidence of compression of the traversing S1 nerve roots as was questioned on prior CT. 4. Distended endometrium  measuring up to 8 mm. Recommend correlation with pelvic ultrasound given patient age. Electronically Signed   By: Valetta Mole M.D.   On: 05/30/2022 11:16    Scheduled Meds:  amLODipine  5 mg Oral BID   carvedilol  12.5 mg Oral BID WC   enoxaparin (LOVENOX) injection  40 mg Subcutaneous Q24H   irbesartan  300 mg Oral Daily   sodium chloride flush  3 mL Intravenous Q12H   trihexyphenidyl  2 mg Oral TID WC   Continuous Infusions:   ceFAZolin (ANCEF) IV 2 g (05/31/22 0606)     LOS: 3 days   Time spent: 45mn  Ravan Schlemmer C Ashlinn Hemrick, DO [facility]  If 7PM-7AM, please contact night-coverage [email or web address]  05/31/2022, 8:13 AM

## 2022-05-31 NOTE — Progress Notes (Signed)
Patient transferred down to xray via bed by transportation staff.

## 2022-06-01 DIAGNOSIS — B9561 Methicillin susceptible Staphylococcus aureus infection as the cause of diseases classified elsewhere: Secondary | ICD-10-CM | POA: Diagnosis not present

## 2022-06-01 DIAGNOSIS — R7881 Bacteremia: Secondary | ICD-10-CM | POA: Diagnosis not present

## 2022-06-01 LAB — CBC
HCT: 37 % (ref 36.0–46.0)
Hemoglobin: 12.1 g/dL (ref 12.0–15.0)
MCH: 30.3 pg (ref 26.0–34.0)
MCHC: 32.7 g/dL (ref 30.0–36.0)
MCV: 92.7 fL (ref 80.0–100.0)
Platelets: 339 10*3/uL (ref 150–400)
RBC: 3.99 MIL/uL (ref 3.87–5.11)
RDW: 13.2 % (ref 11.5–15.5)
WBC: 13.3 10*3/uL — ABNORMAL HIGH (ref 4.0–10.5)
nRBC: 0 % (ref 0.0–0.2)

## 2022-06-01 LAB — COMPREHENSIVE METABOLIC PANEL
ALT: 13 U/L (ref 0–44)
AST: 17 U/L (ref 15–41)
Albumin: 3 g/dL — ABNORMAL LOW (ref 3.5–5.0)
Alkaline Phosphatase: 84 U/L (ref 38–126)
Anion gap: 8 (ref 5–15)
BUN: 25 mg/dL — ABNORMAL HIGH (ref 8–23)
CO2: 29 mmol/L (ref 22–32)
Calcium: 9.4 mg/dL (ref 8.9–10.3)
Chloride: 101 mmol/L (ref 98–111)
Creatinine, Ser: 0.89 mg/dL (ref 0.44–1.00)
GFR, Estimated: >60 mL/min (ref >60)
Glucose, Bld: 132 mg/dL — ABNORMAL HIGH (ref 70–99)
Potassium: 4.4 mmol/L (ref 3.5–5.1)
Sodium: 138 mmol/L (ref 135–145)
Total Bilirubin: 0.4 mg/dL (ref 0.3–1.2)
Total Protein: 6.8 g/dL (ref 6.5–8.1)

## 2022-06-01 MED ORDER — HYDRALAZINE HCL 20 MG/ML IJ SOLN
5.0000 mg | Freq: Four times a day (QID) | INTRAMUSCULAR | Status: DC | PRN
  Filled 2022-06-01: qty 1

## 2022-06-01 MED ORDER — ALUM & MAG HYDROXIDE-SIMETH 200-200-20 MG/5ML PO SUSP
30.0000 mL | ORAL | Status: DC | PRN
  Administered 2022-06-01 – 2022-06-08 (×8): 30 mL via ORAL
  Filled 2022-06-01 (×10): qty 30

## 2022-06-01 NOTE — Progress Notes (Addendum)
PROGRESS NOTE    Kristin Gilmore  PNT:[id number] DOB: [birth year] DOA: 05/27/2022 PCP: [facility] [place]   Brief Narrative:  Kristin Gilmore is a 71 y.o. female with medical history significant of hypertension, dyslipidemia, prediabetes, anxiety, depression who presents with complaints of worsening back pain, denies trauma. Patient reports having severe pain in her lower back with radiation down the left leg. Sees Dr. Louanne Skye and has issues with chronic pain in this area.  They were trying to arrange for her to have an outpatient nerve block, but have been waiting on insurance approval.  She reports that she last had surgery of her lumbar spine back in January of this year.  She denies having any significant fevers, chills, saddle anesthesia, or  incontinence.  Assessment & Plan:   Principal Problem:   Bacteremia due to methicillin susceptible Staphylococcus aureus (MSSA) Active Problems:   Intractable back pain   SIRS (systemic inflammatory response syndrome) (HCC)   Lumbar spinal stenosis   Parkinson's disease (HCC)   Bipolar disorder, in partial remission, most recent episode hypomanic (HCC)   Hyperkalemia   Dehydration   Essential hypertension   Lumbar radicular pain   AKI (acute kidney injury) ([place])   Bursitis of right elbow   Sepsis secondary to MSSA bacteremia, POA Unclear source -Single febrile event at intake -blood cultures growing MSSA, ID consulted per protocol -Continue cefazolin -Multiple imaging looking for source of infection thus far been unremarkable, including MRI of the lumbar spine, plain film of right elbow pelvis and hips and right humerus. -CT of left elbow pending -ESR 41, CRP 32, lactic acid normal, INR normal -TTE performed previously although patient became somewhat combative and the test was not completed, cardiology able to read what imaging was taken without any overt findings of valvular abnormalities. -Lengthy  discussion today previously about need for TEE, patient markedly combative, yelling that she would prefer to talk to her orthopedic surgeon despite recommendations from both myself and infectious disease teams. Ultimately patient did agree to TEE which unfortunately cannot be done until next week. Patient has high risk for refusing this procedure once it becomes available, as such would likely need to treat patient for 6 weeks at minimum for presumed endocarditis given no other source for infection in the setting of MSSA.   Profound noncompliance  -Patient continues to threaten staff "I will throw myself into the floor" when asked to participate in care or take medications that she is not willing to participate in or take respectively. -Patient became combative when attempting to complete echocardiogram -Patient continues to refuse care at bedside per nursing and other staff. -Lengthy discussion with patient about need for compliance -which includes procedures, bathing, vital signs, labs as well as medications.  Acute hyperkalemia, resolved Dehydration/AKI, resolved Resolved with IV fluid and ultimately increased p.o. intake   Essential hypertension -Currently well controlled on home regimen- amlodipine carvedilol telmisartan -Concern that uncontrolled hypertension at intake was in the setting of medication noncompliance at home -Furosemide on hold given above AKI -resume in the next 24 hours   Anxiety and depression -Currently on Xanax only, med rec pending for patient's chronic medications -Current unconfirmed medications listed include amitriptyline, Seroquel, Effexor   Prediabetes Last hemoglobin A1c 5.8 on 06/22/2021.   Home medication regimen appears to include glipizide.   Parkinson's disease Home medication regimen includes Artane 2 mg 3 times daily.  Lower back pain; acute on chronic, chronic history of lumbar stenosis with radiculopathy, POA -  Acute on chronic pain without  deficits or paresthesias. - Initial plan for neurology evaluation and potential nerve block however this appears to have been done previously this month with minimal to no improvement, as such will not be repeated per documentation given limited therapeutics but increased risk of complications. -MRI without overt findings to explain worsening back pain - discussed with ortho -Continue home pain meds including hydrocodone and p.o. Dilaudid - appears to be moderately well controlled at this time, not pain-free but again near her baseline. -Dr. Louanne Skye orthopedics following, appreciate insight and recommendations - likely require outpatient follow up given no indication for procedure/intervention at this time  DVT prophylaxis: Lovenox Code Status: Full Family Communication: None present  Status is: Inpatient  Dispo: The patient is from: Home              Anticipated d/c is to: Home              Anticipated d/c date is: 06/05/22 after TEE              Patient currently not medically stable for discharge  Consultants:  Orthopedic surgery, cardiology, infectious disease  Procedures:  None  Antimicrobials:  cefazolin  Subjective: No acute issues or events overnight  Objective: Vitals:   05/31/22 2033 06/01/22 0355 06/01/22 0558 06/01/22 0744  BP: (!) 161/88 (!) 178/95 (!) 163/76 (!) 165/131  Pulse: 68 66  69  Resp: '18 19  17  ' Temp: 98.2 F (36.8 C) (!) 97.4 F (36.3 C)  97.6 F (36.4 C)  TempSrc: Oral Oral  Oral  SpO2: 98% 98%  97%  Weight:      Height:        Intake/Output Summary (Last 24 hours) at 06/01/2022 0804 Last data filed at 06/01/2022 0143 Gross per 24 hour  Intake 600 ml  Output --  Net 600 ml    Filed Weights   05/27/22 0757 05/30/22 0806  Weight: 72.6 kg 73.5 kg    Examination:  General:  Pleasantly resting in bed, No acute distress. HEENT:  Normocephalic atraumatic.  Sclerae nonicteric, noninjected.  Extraocular movements intact bilaterally. Neck:   Without mass or deformity.  Trachea is midline. Abdomen: Nondistended Extremities: Without cyanosis, clubbing, edema, or obvious deformity. Vascular:  Dorsalis pedis and posterior tibial pulses palpable bilaterally. Skin:  Warm and dry, no erythema, no ulcerations.  Data Reviewed: I have personally reviewed following labs and imaging studies  CBC: Recent Labs  Lab 05/27/22 1046 05/28/22 1038 05/29/22 1330 05/30/22 0621 05/31/22 0737  WBC 12.7* 16.8* 12.2* 12.1* 9.3  NEUTROABS 10.3* 13.3*  --   --   --   HGB 12.6 12.1 10.6* 10.9* 11.4*  HCT 38.3 35.5* 31.1* 31.9* 33.1*  MCV 94.3 92.4 92.6 92.7 91.4  PLT 227 219 173 213 782    Basic Metabolic Panel: Recent Labs  Lab 05/27/22 1046 05/28/22 1038 05/30/22 0621 05/31/22 0737  NA 136 132* 135 136  K 5.3* 4.2 4.3 4.0  CL 99 101 101 101  CO2 '28 23 25 24  ' GLUCOSE 118* 130* 165* 188*  BUN 42* 24* 17 26*  CREATININE 1.07* 0.88 1.00 0.92  CALCIUM 9.1 8.5* 8.7* 9.0    GFR: Estimated Creatinine Clearance: 53.8 mL/min (by C-G formula based on SCr of 0.92 mg/dL). Liver Function Tests: Recent Labs  Lab 05/28/22 1038 05/30/22 0621 05/31/22 0737  AST '26 18 15  ' ALT '28 20 17  ' ALKPHOS 75 73 77  BILITOT 0.6 0.5 0.3  PROT 6.3* 6.1* 6.2*  ALBUMIN 3.3* 2.7* 2.7*    No results for input(s): "LIPASE", "AMYLASE" in the last 168 hours. No results for input(s): "AMMONIA" in the last 168 hours. Coagulation Profile: Recent Labs  Lab 05/28/22 1038  INR 1.2    Cardiac Enzymes: No results for input(s): "CKTOTAL", "CKMB", "CKMBINDEX", "TROPONINI" in the last 168 hours. BNP (last 3 results) No results for input(s): "PROBNP" in the last 8760 hours. HbA1C: No results for input(s): "HGBA1C" in the last 72 hours. CBG: No results for input(s): "GLUCAP" in the last 168 hours. Lipid Profile: No results for input(s): "CHOL", "HDL", "LDLCALC", "TRIG", "CHOLHDL", "LDLDIRECT" in the last 72 hours. Thyroid Function Tests: No results for  input(s): "TSH", "T4TOTAL", "FREET4", "T3FREE", "THYROIDAB" in the last 72 hours. Anemia Panel: No results for input(s): "VITAMINB12", "FOLATE", "FERRITIN", "TIBC", "IRON", "RETICCTPCT" in the last 72 hours. Sepsis Labs: Recent Labs  Lab 05/28/22 1038 05/28/22 1231  LATICACIDVEN 1.0 0.9     Recent Results (from the past 240 hour(s))  Resp Panel by RT-PCR (Flu A&B, Covid) Urine, Clean Catch     Status: None   Collection Time: 05/27/22  8:11 PM   Specimen: Urine, Clean Catch; Nasal Swab  Result Value Ref Range Status   SARS Coronavirus 2 by RT PCR NEGATIVE NEGATIVE Final    Comment: (NOTE) SARS-CoV-2 target nucleic acids are NOT DETECTED.  The SARS-CoV-2 RNA is generally detectable in upper respiratory specimens during the acute phase of infection. The lowest concentration of SARS-CoV-2 viral copies this assay can detect is 138 copies/mL. A negative result does not preclude SARS-Cov-2 infection and should not be used as the sole basis for treatment or other patient management decisions. A negative result may occur with  improper specimen collection/handling, submission of specimen other than nasopharyngeal swab, presence of viral mutation(s) within the areas targeted by this assay, and inadequate number of viral copies(<138 copies/mL). A negative result must be combined with clinical observations, patient history, and epidemiological information. The expected result is Negative.  Fact Sheet for Patients:  EntrepreneurPulse.com.au  Fact Sheet for Healthcare Providers:  IncredibleEmployment.be  This test is no t yet approved or cleared by the [place] FDA and  has been authorized for detection and/or diagnosis of SARS-CoV-2 by FDA under an Emergency Use Authorization (EUA). This EUA will remain  in effect (meaning this test can be used) for the duration of the COVID-19 declaration under Section 564(b)(1) of the Act, 21 U.S.C.section  360bbb-3(b)(1), unless the authorization is terminated  or revoked sooner.       Influenza A by PCR NEGATIVE NEGATIVE Final   Influenza B by PCR NEGATIVE NEGATIVE Final    Comment: (NOTE) The Xpert Xpress SARS-CoV-2/FLU/RSV plus assay is intended as an aid in the diagnosis of influenza from Nasopharyngeal swab specimens and should not be used as a sole basis for treatment. Nasal washings and aspirates are unacceptable for Xpert Xpress SARS-CoV-2/FLU/RSV testing.  Fact Sheet for Patients: EntrepreneurPulse.com.au  Fact Sheet for Healthcare Providers: IncredibleEmployment.be  This test is not yet approved or cleared by the [place] FDA and has been authorized for detection and/or diagnosis of SARS-CoV-2 by FDA under an Emergency Use Authorization (EUA). This EUA will remain in effect (meaning this test can be used) for the duration of the COVID-19 declaration under Section 564(b)(1) of the Act, 21 U.S.C. section 360bbb-3(b)(1), unless the authorization is terminated or revoked.  Performed at [facility], [address] [place], [place] [address]   Culture,  blood (x 2)     Status: Abnormal   Collection Time: 05/28/22 10:38 AM   Specimen: BLOOD RIGHT ARM  Result Value Ref Range Status   Specimen Description BLOOD RIGHT ARM  Final   Special Requests   Final    BOTTLES DRAWN AEROBIC AND ANAEROBIC Blood Culture adequate volume   Culture  Setup Time   Final    GRAM POSITIVE COCCI IN CLUSTERS BOTTLES DRAWN AEROBIC ONLY CRITICAL RESULT CALLED TO, READ BACK BY AND VERIFIED WITH: PHARMD FRedmond Pulling [phone] '@1638'  FH    Culture (A)  Final    STAPHYLOCOCCUS AUREUS SUSCEPTIBILITIES PERFORMED ON PREVIOUS CULTURE WITHIN THE LAST 5 DAYS. Performed at [facility], [place] [address], [place], [place] [address]    Report Status 05/31/2022 FINAL  Final  Culture, blood (x 2)     Status: Abnormal   Collection Time: 05/28/22 10:41  AM   Specimen: BLOOD LEFT ARM  Result Value Ref Range Status   Specimen Description BLOOD LEFT ARM  Final   Special Requests   Final    BOTTLES DRAWN AEROBIC AND ANAEROBIC Blood Culture adequate volume   Culture  Setup Time   Final    GRAM POSITIVE COCCI ANAEROBIC BOTTLE ONLY CRITICAL RESULT CALLED TO, READ BACK BY AND VERIFIED WITH: PHARMD C DAVIS [id number] AT 1406 BY CM Performed at [facility], [place] [address], [place], [place] [address]    Culture STAPHYLOCOCCUS AUREUS (A)  Final   Report Status 05/31/2022 FINAL  Final   Organism ID, Bacteria STAPHYLOCOCCUS AUREUS  Final      Susceptibility   Staphylococcus aureus - MIC*    CIPROFLOXACIN <=0.5 SENSITIVE Sensitive     ERYTHROMYCIN 0.5 SENSITIVE Sensitive     GENTAMICIN <=0.5 SENSITIVE Sensitive     OXACILLIN 0.5 SENSITIVE Sensitive     TETRACYCLINE <=1 SENSITIVE Sensitive     VANCOMYCIN 1 SENSITIVE Sensitive     TRIMETH/SULFA <=10 SENSITIVE Sensitive     CLINDAMYCIN <=0.25 SENSITIVE Sensitive     RIFAMPIN <=0.5 SENSITIVE Sensitive     Inducible Clindamycin NEGATIVE Sensitive     * STAPHYLOCOCCUS AUREUS  Blood Culture ID Panel (Reflexed)     Status: Abnormal   Collection Time: 05/28/22 10:41 AM  Result Value Ref Range Status   Enterococcus faecalis NOT DETECTED NOT DETECTED Final   Enterococcus Faecium NOT DETECTED NOT DETECTED Final   Listeria monocytogenes NOT DETECTED NOT DETECTED Final   Staphylococcus species DETECTED (A) NOT DETECTED Final    Comment: CRITICAL RESULT CALLED TO, READ BACK BY AND VERIFIED WITH: PHARMD C DAVID [id number] AT 1406 BY CM    Staphylococcus aureus (BCID) DETECTED (A) NOT DETECTED Final    Comment: CRITICAL RESULT CALLED TO, READ BACK BY AND VERIFIED WITH: PHARMD C DAVIS [id number] AT 1406 BY CM    Staphylococcus epidermidis NOT DETECTED NOT DETECTED Final   Staphylococcus lugdunensis NOT DETECTED NOT DETECTED Final   Streptococcus species NOT DETECTED NOT DETECTED Final   Streptococcus  agalactiae NOT DETECTED NOT DETECTED Final   Streptococcus pneumoniae NOT DETECTED NOT DETECTED Final   Streptococcus pyogenes NOT DETECTED NOT DETECTED Final   A.calcoaceticus-baumannii NOT DETECTED NOT DETECTED Final   Bacteroides fragilis NOT DETECTED NOT DETECTED Final   Enterobacterales NOT DETECTED NOT DETECTED Final   Enterobacter cloacae complex NOT DETECTED NOT DETECTED Final   Escherichia coli NOT DETECTED NOT DETECTED Final   Klebsiella aerogenes NOT DETECTED NOT DETECTED Final   Klebsiella oxytoca NOT DETECTED  NOT DETECTED Final   Klebsiella pneumoniae NOT DETECTED NOT DETECTED Final   Proteus species NOT DETECTED NOT DETECTED Final   Salmonella species NOT DETECTED NOT DETECTED Final   Serratia marcescens NOT DETECTED NOT DETECTED Final   Haemophilus influenzae NOT DETECTED NOT DETECTED Final   Neisseria meningitidis NOT DETECTED NOT DETECTED Final   Pseudomonas aeruginosa NOT DETECTED NOT DETECTED Final   Stenotrophomonas maltophilia NOT DETECTED NOT DETECTED Final   Candida albicans NOT DETECTED NOT DETECTED Final   Candida auris NOT DETECTED NOT DETECTED Final   Candida glabrata NOT DETECTED NOT DETECTED Final   Candida krusei NOT DETECTED NOT DETECTED Final   Candida parapsilosis NOT DETECTED NOT DETECTED Final   Candida tropicalis NOT DETECTED NOT DETECTED Final   Cryptococcus neoformans/gattii NOT DETECTED NOT DETECTED Final   Meth resistant mecA/C and MREJ NOT DETECTED NOT DETECTED Final    Comment: Performed at [facility], [place] [address] [place], [place] [address]  Culture, blood (Routine X 2) w Reflex to ID Panel     Status: None (Preliminary result)   Collection Time: 05/30/22  6:21 AM   Specimen: BLOOD  Result Value Ref Range Status   Specimen Description BLOOD RIGHT ANTECUBITAL  Final   Special Requests   Final    BOTTLES DRAWN AEROBIC AND ANAEROBIC Blood Culture adequate volume   Culture   Final    NO GROWTH 1 DAY Performed at [facility], [place] [address] [place], [place] [address]    Report Status PENDING  Incomplete  Culture, blood (Routine X 2) w Reflex to ID Panel     Status: None (Preliminary result)   Collection Time: 05/30/22  6:26 AM   Specimen: BLOOD RIGHT HAND  Result Value Ref Range Status   Specimen Description BLOOD RIGHT HAND  Final   Special Requests   Final    BOTTLES DRAWN AEROBIC AND ANAEROBIC Blood Culture adequate volume   Culture   Final    NO GROWTH 1 DAY Performed at [facility], [place] [address] [place], [place] [address]    Report Status PENDING  Incomplete         Radiology Studies: DG Humerus Right  Addendum Date: 05/31/2022   ADDENDUM REPORT: 05/31/2022 10:24 ADDENDUM: There is lucency in the soft tissues over the distal portion of the humeral diaphysis measuring 8.4 cm x 3.6 cm suspicious for a lipoma or other fatty mass. Further evaluation with CT is recommended. Electronically Signed   By: Valetta Mole M.D.   On: 05/31/2022 10:24   Result Date: 05/31/2022 CLINICAL DATA:  Pain of right mid humerus EXAM: RIGHT HUMERUS - 2+ VIEW COMPARISON:  None Available. FINDINGS: There is no acute fracture or dislocation. Bony alignment is normal. There are amorphous calcifications about the greater tuberosity which may reflect calcific tendinitis. The soft tissues are otherwise unremarkable. IMPRESSION: 1. No acute fracture or dislocation. 2. Calcific densities adjacent to the greater tuberosity may reflect calcific tendinitis. Electronically Signed: By: Valetta Mole M.D. On: 05/31/2022 10:07   DG HIP UNILAT WITH PELVIS 2-3 VIEWS LEFT  Result Date: 05/31/2022 CLINICAL DATA:  Chronic left groin and hip pain. EXAM: DG HIP (WITH OR WITHOUT PELVIS) 2-3V LEFT COMPARISON:  [facility] x-rays from 10/05/2021 FINDINGS: No evidence for an acute fracture. No subluxation or dislocation. Specifically, no evidence for left pubic ramus fracture or left femoral neck fracture.  IMPRESSION: Stable.  No acute bony abnormality.  Electronically Signed   By: Misty Stanley M.D.   On: 05/31/2022 10:08   DG Elbow 2 Views Right  Result Date: 05/31/2022 CLINICAL DATA:  Pain. EXAM: RIGHT ELBOW - 2 VIEW COMPARISON:  None available FINDINGS: No elbow joint effusion. Minimal enthesopathic spurring at the triceps insertion on the olecranon. Mild chronic enthesopathic change at the common flexor tendon origin at the lateral epicondyle. No acute fracture is seen. No dislocation. IMPRESSION: No acute fracture is seen. Mild chronic enthesopathic change at the common flexor tendon origin at the lateral epicondyle. Electronically Signed   By: Yvonne Kendall M.D.   On: 05/31/2022 10:05   ECHOCARDIOGRAM COMPLETE  Result Date: 05/30/2022    ECHOCARDIOGRAM REPORT   Patient Name:   TENEA SENS Date of Exam: 05/30/2022 Medical Rec #:  [id number]            Height:       67.0 in Accession #:    [id number]           Weight:       162.0 lb Date of Birth:  1951/07/21             BSA:          1.849 m Patient Age:    2 years             BP:           124/88 mmHg Patient Gender: F                    HR:           84 bpm. Exam Location:  Inpatient Procedure: 2D Echo, Color Doppler and Cardiac Doppler Indications:    Bacteremia  History:        Patient has prior history of Echocardiogram examinations, most                 recent 11/23/2015. Risk Factors:Hypertension and Dyslipidemia.  Sonographer:    Memory Argue Referring Phys: [id number] [place]  1. Study was not completed as the patient became combative.  2. Left ventricular ejection fraction, by estimation, is 60 to 65%. The left ventricle has normal function. The left ventricle has no regional wall motion abnormalities. Left ventricular diastolic function could not be evaluated.  3. Right ventricular systolic function is normal. The right ventricular size is normal.  4. Left atrial size was severely dilated.  5. The mitral valve is normal  in structure. Mild mitral valve regurgitation. No evidence of mitral stenosis.  6. The aortic valve is tricuspid. Aortic valve regurgitation is not visualized. No aortic stenosis is present. FINDINGS  Left Ventricle: Left ventricular ejection fraction, by estimation, is 60 to 65%. The left ventricle has normal function. The left ventricle has no regional wall motion abnormalities. The left ventricular internal cavity size was normal in size. There is  no left ventricular hypertrophy. Left ventricular diastolic function could not be evaluated. Right Ventricle: The right ventricular size is normal. No increase in right ventricular wall thickness. Right ventricular systolic function is normal. Left Atrium: Left atrial size was severely dilated. Right Atrium: Right atrial size was normal in size. Pericardium: There is no evidence of pericardial effusion. Mitral Valve: The mitral valve is normal in structure. Mild mitral valve regurgitation. No evidence of mitral valve stenosis. Tricuspid Valve: The tricuspid valve is normal in structure. Tricuspid valve regurgitation is not demonstrated. No evidence of tricuspid stenosis. Aortic Valve: The aortic valve  is tricuspid. Aortic valve regurgitation is not visualized. No aortic stenosis is present. Pulmonic Valve: The pulmonic valve was normal in structure. Pulmonic valve regurgitation is not visualized. No evidence of pulmonic stenosis. Aorta: The aortic root is normal in size and structure. Venous: The inferior vena cava was not well visualized. IAS/Shunts: No atrial level shunt detected by color flow Doppler.  LEFT VENTRICLE PLAX 2D LVIDd:         5.40 cm LVIDs:         3.60 cm LV PW:         0.90 cm LV IVS:        0.80 cm LVOT diam:     2.00 cm LVOT Area:     3.14 cm  LEFT ATRIUM           Index LA diam:      3.90 cm 2.11 cm/m LA Vol (A4C): 77.5 ml 41.91 ml/m   AORTA Ao Root diam: 2.60 cm MITRAL VALVE MV Area (PHT): 4.21 cm     SHUNTS MV Decel Time: 180 msec      Systemic Diam: 2.00 cm MV E velocity: 124.00 cm/s MV A velocity: 104.00 cm/s MV E/A ratio:  1.19 Skeet Latch MD Electronically signed by Skeet Latch MD Signature Date/Time: 05/30/2022/7:26:46 PM    Final    MR Lumbar Spine W Wo Contrast  Result Date: 05/30/2022 CLINICAL DATA:  Low back pain with bilateral leg pain and weakness, history of posterior fusion at L4-L5 10/30/2021. EXAM: MRI LUMBAR SPINE WITHOUT AND WITH CONTRAST TECHNIQUE: Multiplanar and multiecho pulse sequences of the lumbar spine were obtained without and with intravenous contrast. CONTRAST:  60m GADAVIST GADOBUTROL 1 MMOL/ML IV SOLN COMPARISON:  CT lumbar spine 04/22/2022, lumbar spine MRI 07/18/2021 FINDINGS: Segmentation: Standard; the lowest formed disc space is designated L5-S1. Alignment: Levocurvature centered at L2 is unchanged. Trace anterolisthesis of L3 on L4 and 5 mm anterolisthesis of L4 on L5 is unchanged. Vertebrae: Postsurgical changes reflecting posterior instrumented fusion at L3 through L5 with interbody spacers are again seen. Lucency around the L5 screws seen on the prior CT is not appreciated by MRI. Background marrow signal is within normal limits. There is degenerative endplate marrow signal abnormality with edema but no enhancement at L2-L3 which is increased since the preoperative study from 2022. There is no other marrow edema. There is no abnormal marrow enhancement. Conus medullaris and cauda equina: Conus extends to the L1 level. Conus and cauda equina appear normal. There is no abnormal enhancement of the conus or cauda equina nerve roots. Paraspinal and other soft tissues: There are postsurgical changes in the soft tissues posterior to the surgical levels. There is no suspicious fluid collection. The endometrium is distended measuring up to 8 mm in thickness. Disc levels: T12-L1: No significant spinal canal or neural foraminal stenosis L1-L2: No significant spinal canal or neural foraminal stenosis.  L2-L3: There is disc desiccation and narrowing with a diffuse disc bulge and small superiorly migrated extrusion, and moderate bilateral facet arthropathy resulting in moderate spinal canal stenosis with subarticular zone narrowing and possible irritation of either traversing L3 nerve root, and no significant neural foraminal stenosis. Compared to the preoperative study from 2022, the spinal canal stenosis has worsened. L3-L4: Status post posterior instrumented fusion and decompression. There is no residual spinal canal stenosis. There is mild residual right worse than left neural foraminal stenosis without convincing evidence of nerve root compression. L4-L5: Status post posterior instrumented fusion and decompression. There is grade  1 anterolisthesis with uncovering of the disc posteriorly and bilateral facet arthropathy. There is no residual spinal canal stenosis; however, there is severe left and mild-to-moderate right neural foraminal stenosis with compression of the exiting left L4 nerve root (5-11). L5-S1: There is advanced disc desiccation and narrowing with a disc bulge and central protrusion and annular fissure, moderate bilateral facet arthropathy resulting in severe left worse than right neural foraminal stenosis without significant spinal canal stenosis. There is no evidence of compression of the traversing S1 nerve roots as was questioned on prior CT. IMPRESSION: 1. Status post posterior instrumented fusion and decompression at L3 through L5 without residual spinal canal stenosis at the surgical levels; however, there is severe left and mild-to-moderate right neural foraminal stenosis at L4-L5 with compression of the exiting left L4 nerve root. No other convincing nerve root compression at the surgical levels. 2. 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 3. Unchanged advanced disc degeneration and moderate facet arthropathy at L5-S1 with severe left worse than right neural foraminal stenosis. No evidence of compression of the traversing S1 nerve roots as was questioned on prior CT. 4. Distended endometrium measuring up to 8 mm. Recommend correlation with pelvic ultrasound given patient age. Electronically Signed   By: Valetta Mole M.D.   On: 05/30/2022 11:16    Scheduled Meds:  amLODipine  5 mg Oral BID   carvedilol  12.5 mg Oral BID WC   enoxaparin (LOVENOX) injection  40 mg Subcutaneous Q24H   irbesartan  300 mg Oral Daily   sodium chloride flush  3 mL Intravenous Q12H   trihexyphenidyl  2 mg Oral TID WC   Continuous Infusions:   ceFAZolin (ANCEF) IV 2 g (06/01/22 0438)     LOS: 4 days   Time spent: 58mn  Latrece Nitta C Chao Blazejewski, DO [facility]  If 7PM-7AM, please contact night-coverage [email or web address]  06/01/2022, 8:04 AM

## 2022-06-01 NOTE — Plan of Care (Signed)
?  Problem: Nutrition: ?Goal: Adequate nutrition will be maintained ?Outcome: Progressing ?  ?Problem: Safety: ?Goal: Ability to remain free from injury will improve ?Outcome: Progressing ?  ?Problem: Pain Managment: ?Goal: General experience of comfort will improve ?Outcome: Not Progressing ?  ?

## 2022-06-01 NOTE — Plan of Care (Signed)

## 2022-06-01 NOTE — Progress Notes (Signed)
PT Cancellation Note  Patient Details Name: Kristin Gilmore MRN: [id number] DOB: 12-19-1950   Cancelled Treatment:    Reason Eval/Treat Not Completed: Patient declined, no reason specified;Pain limiting ability to participate. Pt stated "that is the last thing I want to do" when offered physical therapy.  Donna Bernard, PT   Donna Bernard 06/01/2022, 12:25 PM

## 2022-06-01 NOTE — Progress Notes (Signed)
Patient ID: Kristin Gilmore, female   DOB: [birth year], 71 y.o.   MRN: [id number]  Awaiting CT right elbow. Patient states she is having no pain in th elbow at all. Has full range of motion without pain. Will review scan when available.

## 2022-06-02 ENCOUNTER — Inpatient Hospital Stay (HOSPITAL_COMMUNITY): Payer: Medicare Other

## 2022-06-02 DIAGNOSIS — B9561 Methicillin susceptible Staphylococcus aureus infection as the cause of diseases classified elsewhere: Secondary | ICD-10-CM | POA: Diagnosis not present

## 2022-06-02 DIAGNOSIS — R7881 Bacteremia: Secondary | ICD-10-CM | POA: Diagnosis not present

## 2022-06-02 LAB — COMPREHENSIVE METABOLIC PANEL
ALT: 11 U/L (ref 0–44)
AST: 13 U/L — ABNORMAL LOW (ref 15–41)
Albumin: 2.8 g/dL — ABNORMAL LOW (ref 3.5–5.0)
Alkaline Phosphatase: 78 U/L (ref 38–126)
Anion gap: 12 (ref 5–15)
BUN: 26 mg/dL — ABNORMAL HIGH (ref 8–23)
CO2: 26 mmol/L (ref 22–32)
Calcium: 8.9 mg/dL (ref 8.9–10.3)
Chloride: 96 mmol/L — ABNORMAL LOW (ref 98–111)
Creatinine, Ser: 1.01 mg/dL — ABNORMAL HIGH (ref 0.44–1.00)
GFR, Estimated: 59 mL/min — ABNORMAL LOW (ref >60)
Glucose, Bld: 165 mg/dL — ABNORMAL HIGH (ref 70–99)
Potassium: 4.2 mmol/L (ref 3.5–5.1)
Sodium: 134 mmol/L — ABNORMAL LOW (ref 135–145)
Total Bilirubin: 0.5 mg/dL (ref 0.3–1.2)
Total Protein: 6.4 g/dL — ABNORMAL LOW (ref 6.5–8.1)

## 2022-06-02 LAB — CBC
HCT: 36.5 % (ref 36.0–46.0)
Hemoglobin: 12.2 g/dL (ref 12.0–15.0)
MCH: 30.7 pg (ref 26.0–34.0)
MCHC: 33.4 g/dL (ref 30.0–36.0)
MCV: 91.9 fL (ref 80.0–100.0)
Platelets: 312 10*3/uL (ref 150–400)
RBC: 3.97 MIL/uL (ref 3.87–5.11)
RDW: 13.1 % (ref 11.5–15.5)
WBC: 11.8 10*3/uL — ABNORMAL HIGH (ref 4.0–10.5)
nRBC: 0 % (ref 0.0–0.2)

## 2022-06-02 MED ORDER — IOHEXOL 300 MG/ML  SOLN
100.0000 mL | Freq: Once | INTRAMUSCULAR | Status: AC | PRN
Start: 2022-06-02 — End: 2022-06-02
  Administered 2022-06-02: 100 mL via INTRAVENOUS

## 2022-06-02 NOTE — Progress Notes (Signed)
PROGRESS NOTE    Kristin Gilmore  CBU:[id number] DOB: [birth year] DOA: 05/27/2022 PCP: [facility] [place]   Brief Narrative:  Kristin Gilmore is a 71 y.o. female with medical history significant of hypertension, dyslipidemia, prediabetes, anxiety, depression who presents with complaints of worsening back pain, denies trauma. Patient reports having severe pain in her lower back with radiation down the left leg. Sees Dr. Louanne Skye and has issues with chronic pain in this area.  They were trying to arrange for her to have an outpatient nerve block, but have been waiting on insurance approval.  She reports that she last had surgery of her lumbar spine back in January of this year.  She denies having any significant fevers, chills, saddle anesthesia, or  incontinence.  Assessment & Plan:   Principal Problem:   Bacteremia due to methicillin susceptible Staphylococcus aureus (MSSA) Active Problems:   Intractable back pain   SIRS (systemic inflammatory response syndrome) (HCC)   Lumbar spinal stenosis   Parkinson's disease (HCC)   Bipolar disorder, in partial remission, most recent episode hypomanic (HCC)   Hyperkalemia   Dehydration   Essential hypertension   Lumbar radicular pain   AKI (acute kidney injury) ([place])   Bursitis of right elbow  Sepsis secondary to MSSA bacteremia, POA Unclear source -Single febrile event at intake -blood cultures growing MSSA, ID consulted per protocol -Continue cefazolin -Multiple imaging looking for source of infection thus far been unremarkable, including MRI of the lumbar spine, plain film of right elbow pelvis and hips and right humerus. -ESR 41, CRP 32, lactic acid normal, INR normal -TTE performed previously although patient became somewhat combative and the test was not completed, cardiology able to read what imaging was taken without any overt findings of valvular abnormalities. -CT of left elbow pending - not taken yet -  patient continues to refuse - wants sedation for imaging which we discussed was inappropriate -Lengthy discussion 05/31/22 about need for TEE, patient markedly combative, yelling that she would prefer to talk to her orthopedic surgeon despite recommendations from both myself and infectious disease teams. Ultimately patient did agree to TEE which unfortunately cannot be done until next week (06/05/22). Patient has high risk for refusing this procedure once it becomes available, as such would likely need to treat patient for 6 weeks at minimum for presumed endocarditis given no other source for infection in the setting of MSSA.   Profound noncompliance  -Patient continues to threaten staff "I will throw myself into the floor" when asked to participate in care or take medications that she is not willing to participate in or take respectively. -Patient became combative when attempting to complete echocardiogram -Patient continues to refuse care at bedside per nursing and other staff. -Lengthy discussion with patient about need for compliance -which includes procedures, bathing, vital signs, labs as well as medications.  Acute hyperkalemia, resolved Dehydration/AKI, resolved Resolved with IV fluid and ultimately increased p.o. intake   Essential hypertension -Currently well controlled on home regimen- amlodipine carvedilol telmisartan -Concern that uncontrolled hypertension at intake was in the setting of medication noncompliance at home -Furosemide on hold given above AKI -resume in the next 24 hours   Anxiety and depression -Currently on Xanax only, med rec pending for patient's chronic medications -Current unconfirmed medications listed include amitriptyline, Seroquel, Effexor   Prediabetes Last hemoglobin A1c 5.8 on 06/22/2021.   Home medication regimen appears to include glipizide.   Parkinson's disease Home medication regimen includes Artane 2 mg 3  times daily.  Lower back pain; acute on  chronic, chronic history of lumbar stenosis with radiculopathy, POA - Acute on chronic pain without deficits or paresthesias. - Initial plan for neurology evaluation and potential nerve block however this appears to have been done previously this month with minimal to no improvement, as such will not be repeated per documentation given limited therapeutics but increased risk of complications. -MRI without overt findings to explain worsening back pain - discussed with ortho -Continue home pain meds including hydrocodone and p.o. Dilaudid - appears to be moderately well controlled at this time, not pain-free but again near her baseline. -Dr. Louanne Skye orthopedics following, appreciate insight and recommendations - likely require outpatient follow up given no indication for procedure/intervention at this time  DVT prophylaxis: Lovenox Code Status: Full Family Communication: None present  Status is: Inpatient  Dispo: The patient is from: Home              Anticipated d/c is to: Home              Anticipated d/c date is: 06/05/22 after TEE              Patient currently not medically stable for discharge  Consultants:  Orthopedic surgery, cardiology, infectious disease  Procedures:  None  Antimicrobials:  cefazolin  Subjective: No acute issues or events overnight  Objective: Vitals:   06/01/22 1606 06/01/22 2100 06/02/22 0534 06/02/22 0759  BP: (!) 157/82 (!) 156/84  (!) 175/88  Pulse: 70 72 84 77  Resp: '16 17 20 16  ' Temp: 97.9 F (36.6 C) 98 F (36.7 C) 98.2 F (36.8 C) 97.9 F (36.6 C)  TempSrc: Oral Oral Oral Oral  SpO2: 98% 99% 95% 92%  Weight:      Height:        Intake/Output Summary (Last 24 hours) at 06/02/2022 0806 Last data filed at 06/02/2022 0225 Gross per 24 hour  Intake --  Output 2 ml  Net -2 ml    Filed Weights   05/27/22 0757 05/30/22 0806  Weight: 72.6 kg 73.5 kg    Examination:  General:  Pleasantly resting in bed, No acute distress. HEENT:   Normocephalic atraumatic.  Sclerae nonicteric, noninjected.  Extraocular movements intact bilaterally. Neck:  Without mass or deformity.  Trachea is midline. Abdomen: Nondistended Extremities: Without cyanosis, clubbing, edema, or obvious deformity. Vascular:  Dorsalis pedis and posterior tibial pulses palpable bilaterally. Skin:  Warm and dry, no erythema, no ulcerations.  Data Reviewed: I have personally reviewed following labs and imaging studies  CBC: Recent Labs  Lab 05/27/22 1046 05/28/22 1038 05/29/22 1330 05/30/22 0621 05/31/22 0737 06/01/22 0626 06/02/22 0649  WBC 12.7* 16.8* 12.2* 12.1* 9.3 13.3* 11.8*  NEUTROABS 10.3* 13.3*  --   --   --   --   --   HGB 12.6 12.1 10.6* 10.9* 11.4* 12.1 12.2  HCT 38.3 35.5* 31.1* 31.9* 33.1* 37.0 36.5  MCV 94.3 92.4 92.6 92.7 91.4 92.7 91.9  PLT 227 219 173 213 242 339 143    Basic Metabolic Panel: Recent Labs  Lab 05/28/22 1038 05/30/22 0621 05/31/22 0737 06/01/22 0626 06/02/22 0649  NA 132* 135 136 138 134*  K 4.2 4.3 4.0 4.4 4.2  CL 101 101 101 101 96*  CO2 '23 25 24 29 26  ' GLUCOSE 130* 165* 188* 132* 165*  BUN 24* 17 26* 25* 26*  CREATININE 0.88 1.00 0.92 0.89 1.01*  CALCIUM 8.5* 8.7* 9.0 9.4 8.9  GFR: Estimated Creatinine Clearance: 49 mL/min (A) (by C-G formula based on SCr of 1.01 mg/dL (H)). Liver Function Tests: Recent Labs  Lab 05/28/22 1038 05/30/22 0621 05/31/22 0737 06/01/22 0626 06/02/22 0649  AST '26 18 15 17 ' 13*  ALT '28 20 17 13 11  ' ALKPHOS 75 73 77 84 78  BILITOT 0.6 0.5 0.3 0.4 0.5  PROT 6.3* 6.1* 6.2* 6.8 6.4*  ALBUMIN 3.3* 2.7* 2.7* 3.0* 2.8*    No results for input(s): "LIPASE", "AMYLASE" in the last 168 hours. No results for input(s): "AMMONIA" in the last 168 hours. Coagulation Profile: Recent Labs  Lab 05/28/22 1038  INR 1.2    Cardiac Enzymes: No results for input(s): "CKTOTAL", "CKMB", "CKMBINDEX", "TROPONINI" in the last 168 hours. BNP (last 3 results) No results for  input(s): "PROBNP" in the last 8760 hours. HbA1C: No results for input(s): "HGBA1C" in the last 72 hours. CBG: No results for input(s): "GLUCAP" in the last 168 hours. Lipid Profile: No results for input(s): "CHOL", "HDL", "LDLCALC", "TRIG", "CHOLHDL", "LDLDIRECT" in the last 72 hours. Thyroid Function Tests: No results for input(s): "TSH", "T4TOTAL", "FREET4", "T3FREE", "THYROIDAB" in the last 72 hours. Anemia Panel: No results for input(s): "VITAMINB12", "FOLATE", "FERRITIN", "TIBC", "IRON", "RETICCTPCT" in the last 72 hours. Sepsis Labs: Recent Labs  Lab 05/28/22 1038 05/28/22 1231  LATICACIDVEN 1.0 0.9     Recent Results (from the past 240 hour(s))  Resp Panel by RT-PCR (Flu A&B, Covid) Urine, Clean Catch     Status: None   Collection Time: 05/27/22  8:11 PM   Specimen: Urine, Clean Catch; Nasal Swab  Result Value Ref Range Status   SARS Coronavirus 2 by RT PCR NEGATIVE NEGATIVE Final    Comment: (NOTE) SARS-CoV-2 target nucleic acids are NOT DETECTED.  The SARS-CoV-2 RNA is generally detectable in upper respiratory specimens during the acute phase of infection. The lowest concentration of SARS-CoV-2 viral copies this assay can detect is 138 copies/mL. A negative result does not preclude SARS-Cov-2 infection and should not be used as the sole basis for treatment or other patient management decisions. A negative result may occur with  improper specimen collection/handling, submission of specimen other than nasopharyngeal swab, presence of viral mutation(s) within the areas targeted by this assay, and inadequate number of viral copies(<138 copies/mL). A negative result must be combined with clinical observations, patient history, and epidemiological information. The expected result is Negative.  Fact Sheet for Patients:  EntrepreneurPulse.com.au  Fact Sheet for Healthcare Providers:  IncredibleEmployment.be  This test is no t yet  approved or cleared by the [place] FDA and  has been authorized for detection and/or diagnosis of SARS-CoV-2 by FDA under an Emergency Use Authorization (EUA). This EUA will remain  in effect (meaning this test can be used) for the duration of the COVID-19 declaration under Section 564(b)(1) of the Act, 21 U.S.C.section 360bbb-3(b)(1), unless the authorization is terminated  or revoked sooner.       Influenza A by PCR NEGATIVE NEGATIVE Final   Influenza B by PCR NEGATIVE NEGATIVE Final    Comment: (NOTE) The Xpert Xpress SARS-CoV-2/FLU/RSV plus assay is intended as an aid in the diagnosis of influenza from Nasopharyngeal swab specimens and should not be used as a sole basis for treatment. Nasal washings and aspirates are unacceptable for Xpert Xpress SARS-CoV-2/FLU/RSV testing.  Fact Sheet for Patients: EntrepreneurPulse.com.au  Fact Sheet for Healthcare Providers: IncredibleEmployment.be  This test is not yet approved or cleared by the [place] FDA and has been  authorized for detection and/or diagnosis of SARS-CoV-2 by FDA under an Emergency Use Authorization (EUA). This EUA will remain in effect (meaning this test can be used) for the duration of the COVID-19 declaration under Section 564(b)(1) of the Act, 21 U.S.C. section 360bbb-3(b)(1), unless the authorization is terminated or revoked.  Performed at [facility], [address] [place], [place] [address]   Culture, blood (x 2)     Status: Abnormal   Collection Time: 05/28/22 10:38 AM   Specimen: BLOOD RIGHT ARM  Result Value Ref Range Status   Specimen Description BLOOD RIGHT ARM  Final   Special Requests   Final    BOTTLES DRAWN AEROBIC AND ANAEROBIC Blood Culture adequate volume   Culture  Setup Time   Final    GRAM POSITIVE COCCI IN CLUSTERS BOTTLES DRAWN AEROBIC ONLY CRITICAL RESULT CALLED TO, READ BACK BY AND VERIFIED WITH: PHARMD Tillman Sers  [phone] '@1638'  FH    Culture (A)  Final    STAPHYLOCOCCUS AUREUS SUSCEPTIBILITIES PERFORMED ON PREVIOUS CULTURE WITHIN THE LAST 5 DAYS. Performed at [facility], [place] [address], [place], [place] [address]    Report Status 05/31/2022 FINAL  Final  Culture, blood (x 2)     Status: Abnormal   Collection Time: 05/28/22 10:41 AM   Specimen: BLOOD LEFT ARM  Result Value Ref Range Status   Specimen Description BLOOD LEFT ARM  Final   Special Requests   Final    BOTTLES DRAWN AEROBIC AND ANAEROBIC Blood Culture adequate volume   Culture  Setup Time   Final    GRAM POSITIVE COCCI ANAEROBIC BOTTLE ONLY CRITICAL RESULT CALLED TO, READ BACK BY AND VERIFIED WITH: PHARMD C DAVIS [id number] AT 1406 BY CM Performed at [facility], [place] [address], [place], [place] [address]    Culture STAPHYLOCOCCUS AUREUS (A)  Final   Report Status 05/31/2022 FINAL  Final   Organism ID, Bacteria STAPHYLOCOCCUS AUREUS  Final      Susceptibility   Staphylococcus aureus - MIC*    CIPROFLOXACIN <=0.5 SENSITIVE Sensitive     ERYTHROMYCIN 0.5 SENSITIVE Sensitive     GENTAMICIN <=0.5 SENSITIVE Sensitive     OXACILLIN 0.5 SENSITIVE Sensitive     TETRACYCLINE <=1 SENSITIVE Sensitive     VANCOMYCIN 1 SENSITIVE Sensitive     TRIMETH/SULFA <=10 SENSITIVE Sensitive     CLINDAMYCIN <=0.25 SENSITIVE Sensitive     RIFAMPIN <=0.5 SENSITIVE Sensitive     Inducible Clindamycin NEGATIVE Sensitive     * STAPHYLOCOCCUS AUREUS  Blood Culture ID Panel (Reflexed)     Status: Abnormal   Collection Time: 05/28/22 10:41 AM  Result Value Ref Range Status   Enterococcus faecalis NOT DETECTED NOT DETECTED Final   Enterococcus Faecium NOT DETECTED NOT DETECTED Final   Listeria monocytogenes NOT DETECTED NOT DETECTED Final   Staphylococcus species DETECTED (A) NOT DETECTED Final    Comment: CRITICAL RESULT CALLED TO, READ BACK BY AND VERIFIED WITH: PHARMD C DAVID [id number] AT 1406 BY CM    Staphylococcus aureus (BCID) DETECTED  (A) NOT DETECTED Final    Comment: CRITICAL RESULT CALLED TO, READ BACK BY AND VERIFIED WITH: PHARMD C DAVIS [id number] AT 1406 BY CM    Staphylococcus epidermidis NOT DETECTED NOT DETECTED Final   Staphylococcus lugdunensis NOT DETECTED NOT DETECTED Final   Streptococcus species NOT DETECTED NOT DETECTED Final   Streptococcus agalactiae NOT DETECTED NOT DETECTED Final   Streptococcus pneumoniae NOT DETECTED NOT DETECTED Final  Streptococcus pyogenes NOT DETECTED NOT DETECTED Final   A.calcoaceticus-baumannii NOT DETECTED NOT DETECTED Final   Bacteroides fragilis NOT DETECTED NOT DETECTED Final   Enterobacterales NOT DETECTED NOT DETECTED Final   Enterobacter cloacae complex NOT DETECTED NOT DETECTED Final   Escherichia coli NOT DETECTED NOT DETECTED Final   Klebsiella aerogenes NOT DETECTED NOT DETECTED Final   Klebsiella oxytoca NOT DETECTED NOT DETECTED Final   Klebsiella pneumoniae NOT DETECTED NOT DETECTED Final   Proteus species NOT DETECTED NOT DETECTED Final   Salmonella species NOT DETECTED NOT DETECTED Final   Serratia marcescens NOT DETECTED NOT DETECTED Final   Haemophilus influenzae NOT DETECTED NOT DETECTED Final   Neisseria meningitidis NOT DETECTED NOT DETECTED Final   Pseudomonas aeruginosa NOT DETECTED NOT DETECTED Final   Stenotrophomonas maltophilia NOT DETECTED NOT DETECTED Final   Candida albicans NOT DETECTED NOT DETECTED Final   Candida auris NOT DETECTED NOT DETECTED Final   Candida glabrata NOT DETECTED NOT DETECTED Final   Candida krusei NOT DETECTED NOT DETECTED Final   Candida parapsilosis NOT DETECTED NOT DETECTED Final   Candida tropicalis NOT DETECTED NOT DETECTED Final   Cryptococcus neoformans/gattii NOT DETECTED NOT DETECTED Final   Meth resistant mecA/C and MREJ NOT DETECTED NOT DETECTED Final    Comment: Performed at [facility], [address] [place], [place] [address]  Culture, blood (Routine X 2) w Reflex to ID Panel     Status: None  (Preliminary result)   Collection Time: 05/30/22  6:21 AM   Specimen: BLOOD  Result Value Ref Range Status   Specimen Description BLOOD RIGHT ANTECUBITAL  Final   Special Requests   Final    BOTTLES DRAWN AEROBIC AND ANAEROBIC Blood Culture adequate volume   Culture   Final    NO GROWTH 2 DAYS Performed at [facility], [place] [address], [place], [place] [address]    Report Status PENDING  Incomplete  Culture, blood (Routine X 2) w Reflex to ID Panel     Status: None (Preliminary result)   Collection Time: 05/30/22  6:26 AM   Specimen: BLOOD RIGHT HAND  Result Value Ref Range Status   Specimen Description BLOOD RIGHT HAND  Final   Special Requests   Final    BOTTLES DRAWN AEROBIC AND ANAEROBIC Blood Culture adequate volume   Culture   Final    NO GROWTH 2 DAYS Performed at [facility], [place] [address], [place], [place] [address]    Report Status PENDING  Incomplete         Radiology Studies: DG Humerus Right  Addendum Date: 05/31/2022   ADDENDUM REPORT: 05/31/2022 10:24 ADDENDUM: There is lucency in the soft tissues over the distal portion of the humeral diaphysis measuring 8.4 cm x 3.6 cm suspicious for a lipoma or other fatty mass. Further evaluation with CT is recommended. Electronically Signed   By: Valetta Mole M.D.   On: 05/31/2022 10:24   Result Date: 05/31/2022 CLINICAL DATA:  Pain of right mid humerus EXAM: RIGHT HUMERUS - 2+ VIEW COMPARISON:  None Available. FINDINGS: There is no acute fracture or dislocation. Bony alignment is normal. There are amorphous calcifications about the greater tuberosity which may reflect calcific tendinitis. The soft tissues are otherwise unremarkable. IMPRESSION: 1. No acute fracture or dislocation. 2. Calcific densities adjacent to the greater tuberosity may reflect calcific tendinitis. Electronically Signed: By: Valetta Mole M.D. On: 05/31/2022 10:07   DG HIP UNILAT WITH PELVIS 2-3 VIEWS LEFT  Result Date: 05/31/2022 CLINICAL  DATA:  Chronic left groin and hip pain. EXAM: DG HIP (WITH OR WITHOUT PELVIS) 2-3V LEFT COMPARISON:  [facility] [place] x-rays from 10/05/2021 FINDINGS: No evidence for an acute fracture. No subluxation or dislocation. Specifically, no evidence for left pubic ramus fracture or left femoral neck fracture. IMPRESSION: Stable.  No acute bony abnormality. Electronically Signed   By: Misty Stanley M.D.   On: 05/31/2022 10:08   DG Elbow 2 Views Right  Result Date: 05/31/2022 CLINICAL DATA:  Pain. EXAM: RIGHT ELBOW - 2 VIEW COMPARISON:  None available FINDINGS: No elbow joint effusion. Minimal enthesopathic spurring at the triceps insertion on the olecranon. Mild chronic enthesopathic change at the common flexor tendon origin at the lateral epicondyle. No acute fracture is seen. No dislocation. IMPRESSION: No acute fracture is seen. Mild chronic enthesopathic change at the common flexor tendon origin at the lateral epicondyle. Electronically Signed   By: Yvonne Kendall M.D.   On: 05/31/2022 10:05    Scheduled Meds:  amLODipine  5 mg Oral BID   carvedilol  12.5 mg Oral BID WC   enoxaparin (LOVENOX) injection  40 mg Subcutaneous Q24H   irbesartan  300 mg Oral Daily   sodium chloride flush  3 mL Intravenous Q12H   trihexyphenidyl  2 mg Oral TID WC   Continuous Infusions:   ceFAZolin (ANCEF) IV 2 g (06/02/22 0625)     LOS: 5 days   Time spent: 33mn  Carzell Saldivar C Keymoni Mccaster, DO [facility]  If 7PM-7AM, please contact night-coverage [email or web address]  06/02/2022, 8:06 AM

## 2022-06-02 NOTE — Progress Notes (Signed)
PT Cancellation Note  Patient Details Name: Kristin Gilmore MRN: [id number] DOB: 03/28/1951   Cancelled Treatment:    Reason Eval/Treat Not Completed: Patient declined, no reason specified;Pain limiting ability to participate. Pt politely declining PT at this time, and reported she would likely not be able to participate at all today due to her pain level being too high. Educated pt on importance of mobility to prevent muscular atrophy and deconditioning, to promote BM, and to reduce risk of PNA. Pt verbalized understanding and did report concern over only being able to take 2 steps currently. Developed plan with pt to notify RN when pain is tolerable so RN can contact therapy to come to work with pt during that time. She verbalized understanding. Will plan to follow-up later as time permits.   Moishe Spice, PT, DPT Acute Rehabilitation Services  Office: [place] 06/02/2022, 12:45 PM

## 2022-06-02 NOTE — Evaluation (Signed)
Physical Therapy Evaluation Patient Details Name: Kristin Gilmore MRN: [id number] DOB: July 04, 1951 Today's Date: 06/02/2022  History of Present Illness  Pt is a 71 y.o. female who presented 05/27/22 with worsening chronic lower back pain with radicular symptoms. Pt admitted with sepsis secondary to MSSA bacteremia with unclear source. PMH includes OA, depression, HTN, parkinson;s disease, pituitary microadenoma, PNA, R L3-5 TLOIF 10/30/2021   Clinical Impression  Pt presents with condition above and deficits mentioned below, see PT Problem List. PTA, she was mod I with mobility, often using a RW when alone for her safety. She lives alone in a 2-level house with 3 STE and has neighbors who can assist her PRN. At this time, pt is limited by pain, only tolerating taking a few small, shuffling stpes at EOB using a RW and needing minA for stability. Pt also demonstrates deficits in strength, balance, and activity tolerance. She is at high risk for falls, does not have the amount of support she needs at home, and would benefit from short-term rehab at a facility, thus recommending pt d/c to a SNF. Pt reports a desire to go to [facility]. Will continue to follow acutely.     Recommendations for follow up therapy are one component of a multi-disciplinary discharge planning process, led by the attending physician.  Recommendations may be updated based on patient status, additional functional criteria and insurance authorization.  Follow Up Recommendations Skilled nursing-short term rehab (<3 hours/day) (pt would like to go [facility]) Can patient physically be transported by private vehicle: Yes    Assistance Recommended at Discharge Frequent or constant Supervision/Assistance  Patient can return home with the following  A little help with walking and/or transfers;A little help with bathing/dressing/bathroom;Assistance with cooking/housework;Assist for transportation;Help with stairs or ramp for  entrance    Equipment Recommendations None recommended by PT  Recommendations for Other Services  OT consult    Functional Status Assessment Patient has had a recent decline in their functional status and demonstrates the ability to make significant improvements in function in a reasonable and predictable amount of time.     Precautions / Restrictions Precautions Precautions: Fall Restrictions Weight Bearing Restrictions: No      Mobility  Bed Mobility Overal bed mobility: Needs Assistance Bed Mobility: Supine to Sit, Sit to Supine     Supine to sit: Min assist, HOB elevated Sit to supine: Min assist, Min guard, HOB elevated   General bed mobility comments: Min guard assist for pt to return herself to supine upon entering room, extra time to pivot hips and lift legs onto bed. MinA to ascend trunk to return to sit EOB with HOB elevated and use of rails. MinA to lift legs back onto bed per pt request on 2nd rep.    Transfers Overall transfer level: Needs assistance Equipment used: Rolling walker (2 wheels) Transfers: Sit to/from Stand Sit to Stand: Min assist           General transfer comment: MinA for stability when coming to stand from EOB, extra time to power up to stand.    Ambulation/Gait Ambulation/Gait assistance: Min assist Gait Distance (Feet): 2 Feet Assistive device: Rolling walker (2 wheels) Gait Pattern/deviations: Step-through pattern, Decreased step length - right, Decreased step length - left, Decreased stride length, Trunk flexed, Shuffle Gait velocity: reduced Gait velocity interpretation: <1.31 ft/sec, indicative of household ambulator   General Gait Details: Pt with very slow, shuffling steps anteriorly and posteriorly at EOB. Pt limited in progressing further by R knee  and back pain. Maintains trunk and knee flexion. MinA for stability.  Stairs            Wheelchair Mobility    Modified Rankin (Stroke Patients Only)       Balance  Overall balance assessment: Needs assistance Sitting-balance support: No upper extremity supported, Feet supported Sitting balance-Leahy Scale: Fair Sitting balance - Comments: Static sitting EOB with supervision   Standing balance support: Bilateral upper extremity supported, During functional activity, Reliant on assistive device for balance Standing balance-Leahy Scale: Poor Standing balance comment: Reliant on RW and minA                             Pertinent Vitals/Pain Pain Assessment Pain Assessment: Faces Faces Pain Scale: Hurts a little bit Pain Location: back, R knee Pain Descriptors / Indicators: Discomfort Pain Intervention(s): Limited activity within patient's tolerance, Monitored during session, Premedicated before session, Repositioned    Home Living Family/patient expects to be discharged to:: Private residence Living Arrangements: Alone Available Help at Discharge: Neighbor Type of Home: House Home Access: Stairs to enter Entrance Stairs-Rails: Psychiatric nurse of Steps: 3 Alternate Level Stairs-Number of Steps: stair lift Home Layout: Two level;Able to live on main level with bedroom/bathroom Home Equipment: Rolling Walker (2 wheels);Cane - single point;Shower seat;Wheelchair - manual;Grab bars - tub/shower;Wheelchair - power;BSC/3in1;Grab bars - toilet Additional Comments: Pt's spouse recently passed away    Prior Function Prior Level of Function : Needs assist             Mobility Comments: Can walk without RW but tends to use RW when alone for safety purposes. Mod I for mobility. Recently got electric w/c which she intends to use for longer distances in community only. ADLs Comments: Has not driven since April. Does not leave house unless neighbor helps her. Was dusting house PTA.     Hand Dominance        Extremity/Trunk Assessment   Upper Extremity Assessment Upper Extremity Assessment: Defer to OT evaluation  (resting tremors noted)    Lower Extremity Assessment Lower Extremity Assessment: Generalized weakness;LLE deficits/detail;RLE deficits/detail RLE Deficits / Details: hamstring tightness noted LLE Deficits / Details: reports hx of polio in L leg    Cervical / Trunk Assessment Cervical / Trunk Assessment: Other exceptions Cervical / Trunk Exceptions: hx of back surgeries and chronic back pain  Communication   Communication: No difficulties  Cognition Arousal/Alertness: Awake/alert Behavior During Therapy: WFL for tasks assessed/performed Overall Cognitive Status: Within Functional Limits for tasks assessed                                 General Comments: Needs encouragement to try to progress mobility, limited by pain.        General Comments      Exercises     Assessment/Plan    PT Assessment Patient needs continued PT services  PT Problem List Decreased strength;Decreased range of motion;Decreased activity tolerance;Decreased balance;Decreased mobility;Pain       PT Treatment Interventions Gait training;DME instruction;Functional mobility training;Therapeutic activities;Therapeutic exercise;Balance training;Neuromuscular re-education;Patient/family education    PT Goals (Current goals can be found in the Care Plan section)  Acute Rehab PT Goals Patient Stated Goal: to walk better PT Goal Formulation: With patient Time For Goal Achievement: 06/16/22 Potential to Achieve Goals: Good    Frequency Min 2X/week     Co-evaluation  AM-PAC PT "6 Clicks" Mobility  Outcome Measure Help needed turning from your back to your side while in a flat bed without using bedrails?: A Little Help needed moving from lying on your back to sitting on the side of a flat bed without using bedrails?: A Little Help needed moving to and from a bed to a chair (including a wheelchair)?: A Little Help needed standing up from a chair using your arms (e.g.,  wheelchair or bedside chair)?: A Little Help needed to walk in hospital room?: Total Help needed climbing 3-5 steps with a railing? : Total 6 Click Score: 14    End of Session Equipment Utilized During Treatment: Gait belt Activity Tolerance: Patient limited by pain Patient left: in bed;with call bell/phone within reach;with bed alarm set   PT Visit Diagnosis: Unsteadiness on feet (R26.81);Other abnormalities of gait and mobility (R26.89);Muscle weakness (generalized) (M62.81);Difficulty in walking, not elsewhere classified (R26.2);Pain Pain - Right/Left: Right Pain - part of body: Knee (back)    Time: 4917-9150 PT Time Calculation (min) (ACUTE ONLY): 39 min   Charges:   PT Evaluation $PT Eval Moderate Complexity: 1 Mod PT Treatments $Therapeutic Activity: 23-37 mins        Moishe Spice, PT, DPT Acute Rehabilitation Services  Office: [phone]   Orvan Falconer 06/02/2022, 4:30 PM

## 2022-06-03 DIAGNOSIS — B9561 Methicillin susceptible Staphylococcus aureus infection as the cause of diseases classified elsewhere: Secondary | ICD-10-CM | POA: Diagnosis not present

## 2022-06-03 DIAGNOSIS — R7881 Bacteremia: Secondary | ICD-10-CM | POA: Diagnosis not present

## 2022-06-03 MED ORDER — ORAL CARE MOUTH RINSE: 15.0000 mL | OROMUCOSAL | Status: DC | PRN

## 2022-06-03 NOTE — Social Work (Signed)
CSW went to speak with pt, PT was working with pt in room. CSW will follow up at a later time.

## 2022-06-03 NOTE — NC FL2 (Signed)
[place] MEDICAID FL2 LEVEL OF CARE SCREENING TOOL     IDENTIFICATION  Patient Name: Kristin Gilmore Birthdate: Mar 21, 1951 Sex: female Admission Date (Current Location): 05/27/2022  [facility] and [place] Number:  Herbalist and Address:  [facility], [place] [address] [place], [place] [address]      Provider Number: [id number]  Attending Physician Name and Address:  Kayleen Memos, DO  Relative Name and Phone Number:  Janina Mayo [phone]    Current Level of Care: Hospital Recommended Level of Care: [place] Prior Approval Number:    Date Approved/Denied:   PASRR Number: [id number]  Discharge Plan: SNF    Current Diagnoses: Patient Active Problem List   Diagnosis Date Noted   Lumbar radicular pain    AKI (acute kidney injury) ([place])    Bursitis of right elbow    Bacteremia due to methicillin susceptible Staphylococcus aureus (MSSA) 05/29/2022   SIRS (systemic inflammatory response syndrome) ([place]) 05/28/2022   Bipolar disorder, in partial remission, most recent episode hypomanic ([place]) 05/28/2022   Hyperkalemia 05/28/2022   Dehydration 05/28/2022   Essential hypertension 05/28/2022   Intractable back pain 05/27/2022   Other spondylosis with radiculopathy, lumbar region 10/30/2021   Other secondary scoliosis, lumbar region 10/30/2021   Spondylolisthesis, lumbar region 10/30/2021   Spondylolisthesis of lumbosacral region 10/30/2021   Status post total right knee replacement 06/26/2021   Primary osteoarthritis of right knee 06/25/2021   Parkinson's disease ([place]) 01/24/2021   Trochanteric bursitis, left hip 01/18/2020   Lumbar spinal stenosis 12/19/2017   Pain in joint, ankle and foot 05/02/2016   Chronic venous insufficiency 05/02/2016   Peripheral edema 11/01/2015   Intractable chronic migraine without aura 11/02/2014   Tremor 04/13/2014   Migraine without aura 04/13/2014   Pituitary  microadenoma ([place]) 04/13/2014    Orientation RESPIRATION BLADDER Height & Weight     Self, Time, Situation, Place  Normal Continent Weight: 162 lb (73.5 kg) Height:  '5\' 7"'$  (170.2 cm)  BEHAVIORAL SYMPTOMS/MOOD NEUROLOGICAL BOWEL NUTRITION STATUS      Continent    AMBULATORY STATUS COMMUNICATION OF NEEDS Skin   Limited Assist Verbally Normal                       Personal Care Assistance Level of Assistance  Bathing, Dressing, Feeding Bathing Assistance: Limited assistance Feeding assistance: Independent Dressing Assistance: Limited assistance     Functional Limitations Info  Sight, Speech, Hearing Sight Info: Impaired Hearing Info: Adequate Speech Info: Adequate    SPECIAL CARE FACTORS FREQUENCY  PT (By licensed PT), OT (By licensed OT)     PT Frequency: 3x a week OT Frequency: 5x a week            Contractures Contractures Info: Not present    Additional Factors Info  Code Status, Allergies Code Status Info: Full Allergies Info: Levofloxacin   Amoxicillin   Clarithromycin   Atropine   Fish Allergy           Current Medications (06/03/2022):  This is the current hospital active medication list Current Facility-Administered Medications  Medication Dose Route Frequency Provider Last Rate Last Admin   acetaminophen (TYLENOL) tablet 650 mg  650 mg Oral Q6H PRN Fuller Plan A, MD       Or   acetaminophen (TYLENOL) suppository 650 mg  650 mg Rectal Q6H PRN Fuller Plan A, MD       albuterol (PROVENTIL) (2.5 MG/3ML)  0.083% nebulizer solution 2.5 mg  2.5 mg Nebulization Q6H PRN Fuller Plan A, MD       ALPRAZolam Duanne Moron) tablet 0.25 mg  0.25 mg Oral BID PRN Fuller Plan A, MD   0.25 mg at 06/03/22 1028   alum & mag hydroxide-simeth (MAALOX/MYLANTA) 200-200-20 MG/5ML suspension 30 mL  30 mL Oral Q4H PRN Little Ishikawa, MD   30 mL at 06/03/22 0651   amLODipine (NORVASC) tablet 5 mg  5 mg Oral BID Fuller Plan A, MD   5 mg at 06/03/22 1017    carvedilol (COREG) tablet 12.5 mg  12.5 mg Oral BID WC Smith, Rondell A, MD   12.5 mg at 06/03/22 1016   ceFAZolin (ANCEF) IVPB 2g/100 mL premix  2 g Intravenous Q8H Jule Ser N, DO 200 mL/hr at 06/03/22 1412 2 g at 06/03/22 1412   enoxaparin (LOVENOX) injection 40 mg  40 mg Subcutaneous Q24H Smith, Rondell A, MD   40 mg at 06/02/22 2200   hydrALAZINE (APRESOLINE) injection 5 mg  5 mg Intravenous Q6H PRN Shela Leff, MD       HYDROcodone-acetaminophen (NORCO) 7.5-325 MG per tablet 1 tablet  1 tablet Oral Q4H PRN Jessy Oto, MD   1 tablet at 06/03/22 1022   HYDROmorphone (DILAUDID) tablet 2-4 mg  2-4 mg Oral Q4H PRN Fuller Plan A, MD   4 mg at 06/03/22 1257   irbesartan (AVAPRO) tablet 300 mg  300 mg Oral Daily Tamala Julian, Rondell A, MD   300 mg at 06/03/22 1016   ondansetron (ZOFRAN) tablet 4 mg  4 mg Oral Q6H PRN Little Ishikawa, MD   4 mg at 05/30/22 1331   Oral care mouth rinse  15 mL Mouth Rinse PRN Little Ishikawa, MD       sodium chloride flush (NS) 0.9 % injection 3 mL  3 mL Intravenous Q12H Smith, Rondell A, MD   3 mL at 06/03/22 1028   trihexyphenidyl (ARTANE) tablet 2 mg  2 mg Oral TID WC Fuller Plan A, MD   2 mg at 06/03/22 1257     Discharge Medications: Please see discharge summary for a list of discharge medications.  Relevant Imaging Results:  Relevant Lab Results:   Additional Information SS# [id number]  Reece Agar, [place]

## 2022-06-03 NOTE — Care Management Important Message (Signed)
Important Message  Patient Details  Name: Kristin Gilmore MRN: [id number] Date of Birth: 1951/05/06   Medicare Important Message Given:  Yes     Edwinna Rochette Montine Circle 06/03/2022, 12:37 PM

## 2022-06-03 NOTE — Evaluation (Signed)
Occupational Therapy Evaluation Patient Details Name: Kristin Gilmore MRN: [id number] DOB: 1951-06-30 Today's Date: 06/03/2022   History of Present Illness Pt is a 71 y.o. female who presented 05/27/22 with worsening chronic lower back pain with radicular symptoms. Pt admitted with sepsis secondary to MSSA bacteremia with unclear source. PMH includes OA, depression, HTN, parkinson;s disease, pituitary microadenoma, PNA, R L3-5 TLOIF 10/30/2021   Clinical Impression   Pt reports using cane and rollator for mobility at baseline, has needed increased assistance with IADLs, reports having HH in the past. Pt currently needing supervision-max A for ADLs, min A for bed mobility and min A for transfers with RW. Able to take a few steps toward chair before fatiguing and quickly verbalizing "I need to sit". Pt tangential with conversation, needing cues to redirect multiple times thorughout session. Pt presenting with impairments listed below, will follow acutely. Recommend SNF at d/c.      Recommendations for follow up therapy are one component of a multi-disciplinary discharge planning process, led by the attending physician.  Recommendations may be updated based on patient status, additional functional criteria and insurance authorization.   Follow Up Recommendations  Skilled nursing-short term rehab (<3 hours/day)    Assistance Recommended at Discharge Frequent or constant Supervision/Assistance  Patient can return home with the following A little help with walking and/or transfers;A lot of help with bathing/dressing/bathroom;Assistance with cooking/housework;Assist for transportation;Help with stairs or ramp for entrance;Direct supervision/assist for financial management;Direct supervision/assist for medications management    Functional Status Assessment  Patient has had a recent decline in their functional status and demonstrates the ability to make significant improvements in function in a  reasonable and predictable amount of time.  Equipment Recommendations  None recommended by OT;Other (comment) (defer)    Recommendations for Other Services Rehab consult     Precautions / Restrictions Precautions Precautions: Fall Restrictions Weight Bearing Restrictions: No      Mobility Bed Mobility Overal bed mobility: Needs Assistance Bed Mobility: Supine to Sit     Supine to sit: Min assist, HOB elevated     General bed mobility comments: min A for trunk elevation    Transfers Overall transfer level: Needs assistance Equipment used: Rolling walker (2 wheels) Transfers: Sit to/from Stand, Bed to chair/wheelchair/BSC Sit to Stand: Min assist Stand pivot transfers: Min assist         General transfer comment: min A, cues for sequencing/safety      Balance Overall balance assessment: Needs assistance Sitting-balance support: No upper extremity supported, Feet supported Sitting balance-Leahy Scale: Fair Sitting balance - Comments: Static sitting EOB with supervision   Standing balance support: Bilateral upper extremity supported, During functional activity, Reliant on assistive device for balance Standing balance-Leahy Scale: Poor Standing balance comment: Reliant on RW and minA                           ADL either performed or assessed with clinical judgement   ADL Overall ADL's : Needs assistance/impaired Eating/Feeding: Supervision/ safety   Grooming: Supervision/safety   Upper Body Bathing: Minimal assistance   Lower Body Bathing: Maximal assistance   Upper Body Dressing : Minimal assistance   Lower Body Dressing: Maximal assistance   Toilet Transfer: Minimal assistance   Toileting- Clothing Manipulation and Hygiene: Moderate assistance       Functional mobility during ADLs: Moderate assistance       Vision   Vision Assessment?: No apparent visual deficits  Perception     Praxis      Pertinent Vitals/Pain Pain  Assessment Pain Assessment: Faces Pain Score: 6  Faces Pain Scale: Hurts even more Pain Location: back to BLE, reports sciatica Pain Descriptors / Indicators: Discomfort Pain Intervention(s): Limited activity within patient's tolerance, Monitored during session, Repositioned     Hand Dominance     Extremity/Trunk Assessment Upper Extremity Assessment Upper Extremity Assessment: Generalized weakness (tremors noted, pt with hx of Parkinson's Disease)   Lower Extremity Assessment Lower Extremity Assessment: Defer to PT evaluation   Cervical / Trunk Assessment Cervical / Trunk Assessment: Other exceptions Cervical / Trunk Exceptions: hx of back surgeries and chronic back pain   Communication Communication Communication: No difficulties   Cognition Arousal/Alertness: Awake/alert Behavior During Therapy: WFL for tasks assessed/performed Overall Cognitive Status: Within Functional Limits for tasks assessed                                       General Comments  VSS on RA    Exercises     Shoulder Instructions      Home Living Family/patient expects to be discharged to:: Private residence Living Arrangements: Alone Available Help at Discharge: Neighbor Type of Home: House Home Access: Stairs to enter Technical brewer of Steps: 3 Entrance Stairs-Rails: Right;Left Home Layout: Two level;Able to live on main level with bedroom/bathroom Alternate Level Stairs-Number of Steps: stair lift   Bathroom Shower/Tub: Occupational psychologist: Standard     Home Equipment: Conservation officer, nature (2 wheels);Cane - single point;Shower seat;Wheelchair - manual;Grab bars - tub/shower;Wheelchair - power;BSC/3in1;Grab bars - toilet          Prior Functioning/Environment Prior Level of Function : Needs assist             Mobility Comments: uses cane and rollator for short distances, has new power w/c for longer distances ADLs Comments: Has not driven since  April. Does not leave house unless neighbor helps her. Has had HH in the past, family friends plans to move in with pt for a few days when she gets home. ind with ADLs        OT Problem List: Decreased strength;Decreased activity tolerance;Decreased range of motion;Impaired balance (sitting and/or standing);Decreased safety awareness      OT Treatment/Interventions: Self-care/ADL training;Therapeutic exercise;Energy conservation;DME and/or AE instruction;Therapeutic activities;Patient/family education;Visual/perceptual remediation/compensation    OT Goals(Current goals can be found in the care plan section) Acute Rehab OT Goals Patient Stated Goal: none stated OT Goal Formulation: With patient Time For Goal Achievement: 06/17/22 Potential to Achieve Goals: Good ADL Goals Pt Will Perform Upper Body Dressing: with supervision;sitting Pt Will Perform Lower Body Dressing: with min assist;sitting/lateral leans;sit to/from stand Pt Will Transfer to Toilet: with supervision;ambulating;stand pivot transfer;bedside commode Additional ADL Goal #1: pt will perform bed mobility with supervision in prep for ADLs  OT Frequency: Min 2X/week    Co-evaluation              AM-PAC OT "6 Clicks" Daily Activity     Outcome Measure Help from another person eating meals?: None Help from another person taking care of personal grooming?: A Little Help from another person toileting, which includes using toliet, bedpan, or urinal?: A Lot Help from another person bathing (including washing, rinsing, drying)?: A Lot Help from another person to put on and taking off regular upper body clothing?: A Little Help from another person  to put on and taking off regular lower body clothing?: A Lot 6 Click Score: 16   End of Session Equipment Utilized During Treatment: Gait belt;Rolling walker (2 wheels) Nurse Communication: Mobility status  Activity Tolerance: Patient tolerated treatment well Patient left: in  chair;with call bell/phone within reach;with chair alarm set  OT Visit Diagnosis: Unsteadiness on feet (R26.81);Other abnormalities of gait and mobility (R26.89);Muscle weakness (generalized) (M62.81)                Time: 1326-1350 OT Time Calculation (min): 24 min Charges:  OT General Charges $OT Visit: 1 Visit OT Evaluation $OT Eval Moderate Complexity: 1 Mod OT Treatments $Self Care/Home Management : 8-22 mins  Lynnda Child, OTD, OTR/L Acute Rehab [phone]) [phone] - [place] 06/03/2022, 2:23 PM

## 2022-06-03 NOTE — Progress Notes (Signed)
Mobility Specialist Progress Note:   06/03/22 0900  Mobility  Activity Transferred to/from [facility]  Level of Assistance Contact guard assist, steadying assist  Assistive Device Front wheel walker  Distance Ambulated (ft) 2 ft  Activity Response Tolerated well  $Mobility charge 1 Mobility   Pt requesting to transfer to [facility] to void. Required no physical assist to stand and pivot to [facility]. Pt able to take small shuffling steps after voiding, returned back to bed with no complaints. Pt left with all needs met.   Kristin Gilmore Acute Rehab Secure Chat or Office Phone: [phone]

## 2022-06-03 NOTE — Progress Notes (Signed)
PROGRESS NOTE    Kristin Gilmore  JQB:[id number] DOB: 1950-10-24 DOA: 05/27/2022 PCP: [facility] [place]   Brief Narrative:  Kristin Gilmore is a 71 y.o. female with medical history significant of hypertension, dyslipidemia, prediabetes, anxiety, depression who presents with complaints of worsening back pain, denies trauma. Patient reports having severe pain in her lower back with radiation down the left leg. Sees Dr. Louanne Skye and has issues with chronic pain in this area.  They were trying to arrange for her to have an outpatient nerve block, but have been waiting on insurance approval.  She reports that she last had surgery of her lumbar spine back in January of this year.  She denies having any significant fevers, chills, saddle anesthesia, or  incontinence.  06/03/2022: The patient was seen and examined at bedside.  Reports pain in her right knee and lower back.  X-ray done on 04/09/2022 showed fracture to the lateral portion of the patella.  Component looks stable.  Fracture appears to be chronic.  Had an MRI lumbar spine with and without contrast on 05/30/2022.  Assessment & Plan:   Principal Problem:   Bacteremia due to methicillin susceptible Staphylococcus aureus (MSSA) Active Problems:   Intractable back pain   SIRS (systemic inflammatory response syndrome) (HCC)   Lumbar spinal stenosis   Parkinson's disease (HCC)   Bipolar disorder, in partial remission, most recent episode hypomanic ([place])   Hyperkalemia   Dehydration   Essential hypertension   Lumbar radicular pain   AKI (acute kidney injury) ([place])   Bursitis of right elbow  Sepsis secondary to MSSA bacteremia, POA Unclear source Blood cultures positive for MSSA, pansensitive.   ID following  Continue cefazolin as recommended by ID.   -Multiple imaging looking for source of infection thus far been unrevealing. -ESR 41, CRP 32, lactic acid normal, INR normal -TTE performed previously although  patient became somewhat combative and the test was not completed, cardiology able to read what imaging was taken without any overt findings of valvular abnormalities. -CT of left elbow 06/02/2022 showed olecranon bursitis with bursal distension measuring 3.1 x 0.9 x 3.2 cm. Mild adjacent soft tissue swelling. TEE planned on 06/05/2022.   Essential hypertension BP stable Continue home oral antihypertensives Continue to monitor vital signs.   Chronic anxiety and depression -Currently on Xanax only, med rec pending for patient's chronic medications -Current unconfirmed medications listed include amitriptyline, Seroquel, Effexor   Prediabetes Last hemoglobin A1c 5.8 on 06/22/2021.   Home medication regimen appears to include glipizide. Hold off oral hypoglycemics Diet-controlled   Parkinson's disease Continue Home medication regimen includes Artane 2 mg 3 times daily.  Lower back pain; acute on chronic, chronic history of lumbar stenosis with radiculopathy, POA - Acute on chronic pain without deficits or paresthesias. - Initial plan for neurology evaluation and potential nerve block however this appears to have been done previously this month with minimal to no improvement, as such will not be repeated given limited therapeutics but increased risk of complications. -MRI without overt findings to explain worsening back pain - discussed with ortho -Continue home pain meds including hydrocodone and p.o. Dilaudid - appears to be moderately well controlled at this time, not pain-free but again near her baseline. -Dr. Louanne Skye orthopedics following, appreciate insight and recommendations - likely require outpatient follow up given no indication for procedure/intervention at this time  DVT prophylaxis: Subcu Lovenox daily Code Status: Full Family Communication: None present  Status is: Inpatient  Dispo: The patient  is from: Home              Anticipated d/c is to: Home              Anticipated d/c  date is: 06/06/22 after TEE              Patient currently not medically stable for discharge  Consultants:  Orthopedic surgery, cardiology, infectious disease  Procedures:  None  Antimicrobials:  cefazolin  Subjective: No acute issues or events overnight  Objective: Vitals:   06/02/22 0759 06/02/22 1538 06/02/22 2226 06/03/22 0813  BP: (!) 175/88 (!) 142/90 (!) 160/87 (!) 136/96  Pulse: 77 66 69 73  Resp: '16 16 18 17  ' Temp: 97.9 F (36.6 C) 97.6 F (36.4 C) 98 F (36.7 C) 98.4 F (36.9 C)  TempSrc: Oral Oral  Oral  SpO2: 92% 98% 96% 97%  Weight:      Height:        Intake/Output Summary (Last 24 hours) at 06/03/2022 1307 Last data filed at 06/03/2022 0525 Gross per 24 hour  Intake 1203 ml  Output 300 ml  Net 903 ml   Filed Weights   05/27/22 0757 05/30/22 0806  Weight: 72.6 kg 73.5 kg    Examination:  General: Pleasant well-developed well-nourished in no acute distress.  She is alert and oriented x3 HEENT: Normocephalic atraumatic.   Extraocular movements intact bilaterally. Cardiothoracic: Regular rate and rhythm no rubs or gallops. Neck:  Without mass or deformity.  Trachea is midline. Abdomen: Nondistended.  Bowel sounds present. Extremities: No lower extremity edema Vascular: Good pulses.  Skin:  Warm and dry, no erythema, no ulcerations.  Data Reviewed: I have personally reviewed following labs and imaging studies  CBC: Recent Labs  Lab 05/28/22 1038 05/29/22 1330 05/30/22 0621 05/31/22 0737 06/01/22 0626 06/02/22 0649  WBC 16.8* 12.2* 12.1* 9.3 13.3* 11.8*  NEUTROABS 13.3*  --   --   --   --   --   HGB 12.1 10.6* 10.9* 11.4* 12.1 12.2  HCT 35.5* 31.1* 31.9* 33.1* 37.0 36.5  MCV 92.4 92.6 92.7 91.4 92.7 91.9  PLT 219 173 213 242 339 836   Basic Metabolic Panel: Recent Labs  Lab 05/28/22 1038 05/30/22 0621 05/31/22 0737 06/01/22 0626 06/02/22 0649  NA 132* 135 136 138 134*  K 4.2 4.3 4.0 4.4 4.2  CL 101 101 101 101 96*  CO2 '23 25 24  29 26  ' GLUCOSE 130* 165* 188* 132* 165*  BUN 24* 17 26* 25* 26*  CREATININE 0.88 1.00 0.92 0.89 1.01*  CALCIUM 8.5* 8.7* 9.0 9.4 8.9   GFR: Estimated Creatinine Clearance: 49 mL/min (A) (by C-G formula based on SCr of 1.01 mg/dL (H)). Liver Function Tests: Recent Labs  Lab 05/28/22 1038 05/30/22 0621 05/31/22 0737 06/01/22 0626 06/02/22 0649  AST '26 18 15 17 ' 13*  ALT '28 20 17 13 11  ' ALKPHOS 75 73 77 84 78  BILITOT 0.6 0.5 0.3 0.4 0.5  PROT 6.3* 6.1* 6.2* 6.8 6.4*  ALBUMIN 3.3* 2.7* 2.7* 3.0* 2.8*   No results for input(s): "LIPASE", "AMYLASE" in the last 168 hours. No results for input(s): "AMMONIA" in the last 168 hours. Coagulation Profile: Recent Labs  Lab 05/28/22 1038  INR 1.2   Cardiac Enzymes: No results for input(s): "CKTOTAL", "CKMB", "CKMBINDEX", "TROPONINI" in the last 168 hours. BNP (last 3 results) No results for input(s): "PROBNP" in the last 8760 hours. HbA1C: No results for input(s): "HGBA1C" in the last  72 hours. CBG: No results for input(s): "GLUCAP" in the last 168 hours. Lipid Profile: No results for input(s): "CHOL", "HDL", "LDLCALC", "TRIG", "CHOLHDL", "LDLDIRECT" in the last 72 hours. Thyroid Function Tests: No results for input(s): "TSH", "T4TOTAL", "FREET4", "T3FREE", "THYROIDAB" in the last 72 hours. Anemia Panel: No results for input(s): "VITAMINB12", "FOLATE", "FERRITIN", "TIBC", "IRON", "RETICCTPCT" in the last 72 hours. Sepsis Labs: Recent Labs  Lab 05/28/22 1038 05/28/22 1231  LATICACIDVEN 1.0 0.9    Recent Results (from the past 240 hour(s))  Resp Panel by RT-PCR (Flu A&B, Covid) Urine, Clean Catch     Status: None   Collection Time: 05/27/22  8:11 PM   Specimen: Urine, Clean Catch; Nasal Swab  Result Value Ref Range Status   SARS Coronavirus 2 by RT PCR NEGATIVE NEGATIVE Final    Comment: (NOTE) SARS-CoV-2 target nucleic acids are NOT DETECTED.  The SARS-CoV-2 RNA is generally detectable in upper respiratory specimens  during the acute phase of infection. The lowest concentration of SARS-CoV-2 viral copies this assay can detect is 138 copies/mL. A negative result does not preclude SARS-Cov-2 infection and should not be used as the sole basis for treatment or other patient management decisions. A negative result may occur with  improper specimen collection/handling, submission of specimen other than nasopharyngeal swab, presence of viral mutation(s) within the areas targeted by this assay, and inadequate number of viral copies(<138 copies/mL). A negative result must be combined with clinical observations, patient history, and epidemiological information. The expected result is Negative.  Fact Sheet for Patients:  EntrepreneurPulse.com.au  Fact Sheet for Healthcare Providers:  IncredibleEmployment.be  This test is no t yet approved or cleared by the [place] FDA and  has been authorized for detection and/or diagnosis of SARS-CoV-2 by FDA under an Emergency Use Authorization (EUA). This EUA will remain  in effect (meaning this test can be used) for the duration of the COVID-19 declaration under Section 564(b)(1) of the Act, 21 U.S.C.section 360bbb-3(b)(1), unless the authorization is terminated  or revoked sooner.       Influenza A by PCR NEGATIVE NEGATIVE Final   Influenza B by PCR NEGATIVE NEGATIVE Final    Comment: (NOTE) The Xpert Xpress SARS-CoV-2/FLU/RSV plus assay is intended as an aid in the diagnosis of influenza from Nasopharyngeal swab specimens and should not be used as a sole basis for treatment. Nasal washings and aspirates are unacceptable for Xpert Xpress SARS-CoV-2/FLU/RSV testing.  Fact Sheet for Patients: EntrepreneurPulse.com.au  Fact Sheet for Healthcare Providers: IncredibleEmployment.be  This test is not yet approved or cleared by the [place] FDA and has been authorized for detection  and/or diagnosis of SARS-CoV-2 by FDA under an Emergency Use Authorization (EUA). This EUA will remain in effect (meaning this test can be used) for the duration of the COVID-19 declaration under Section 564(b)(1) of the Act, 21 U.S.C. section 360bbb-3(b)(1), unless the authorization is terminated or revoked.  Performed at [facility], [address] [place], [place] [address]   Culture, blood (x 2)     Status: Abnormal   Collection Time: 05/28/22 10:38 AM   Specimen: BLOOD RIGHT ARM  Result Value Ref Range Status   Specimen Description BLOOD RIGHT ARM  Final   Special Requests   Final    BOTTLES DRAWN AEROBIC AND ANAEROBIC Blood Culture adequate volume   Culture  Setup Time   Final    GRAM POSITIVE COCCI IN CLUSTERS BOTTLES DRAWN AEROBIC ONLY CRITICAL RESULT CALLED TO, READ BACK BY AND VERIFIED  WITH: Eustace Pen [phone] '@1638'  FH    Culture (A)  Final    STAPHYLOCOCCUS AUREUS SUSCEPTIBILITIES PERFORMED ON PREVIOUS CULTURE WITHIN THE LAST 5 DAYS. Performed at [facility], [place] [address] [place], [place] [address]    Report Status 05/31/2022 FINAL  Final  Culture, blood (x 2)     Status: Abnormal   Collection Time: 05/28/22 10:41 AM   Specimen: BLOOD LEFT ARM  Result Value Ref Range Status   Specimen Description BLOOD LEFT ARM  Final   Special Requests   Final    BOTTLES DRAWN AEROBIC AND ANAEROBIC Blood Culture adequate volume   Culture  Setup Time   Final    GRAM POSITIVE COCCI ANAEROBIC BOTTLE ONLY CRITICAL RESULT CALLED TO, READ BACK BY AND VERIFIED WITH: PHARMD C DAVIS [id number] AT 1406 BY CM Performed at [facility], [place] [address] [place], [place] [address]    Culture STAPHYLOCOCCUS AUREUS (A)  Final   Report Status 05/31/2022 FINAL  Final   Organism ID, Bacteria STAPHYLOCOCCUS AUREUS  Final      Susceptibility   Staphylococcus aureus - MIC*    CIPROFLOXACIN <=0.5 SENSITIVE Sensitive     ERYTHROMYCIN 0.5 SENSITIVE Sensitive      GENTAMICIN <=0.5 SENSITIVE Sensitive     OXACILLIN 0.5 SENSITIVE Sensitive     TETRACYCLINE <=1 SENSITIVE Sensitive     VANCOMYCIN 1 SENSITIVE Sensitive     TRIMETH/SULFA <=10 SENSITIVE Sensitive     CLINDAMYCIN <=0.25 SENSITIVE Sensitive     RIFAMPIN <=0.5 SENSITIVE Sensitive     Inducible Clindamycin NEGATIVE Sensitive     * STAPHYLOCOCCUS AUREUS  Blood Culture ID Panel (Reflexed)     Status: Abnormal   Collection Time: 05/28/22 10:41 AM  Result Value Ref Range Status   Enterococcus faecalis NOT DETECTED NOT DETECTED Final   Enterococcus Faecium NOT DETECTED NOT DETECTED Final   Listeria monocytogenes NOT DETECTED NOT DETECTED Final   Staphylococcus species DETECTED (A) NOT DETECTED Final    Comment: CRITICAL RESULT CALLED TO, READ BACK BY AND VERIFIED WITH: PHARMD C DAVID [id number] AT 1406 BY CM    Staphylococcus aureus (BCID) DETECTED (A) NOT DETECTED Final    Comment: CRITICAL RESULT CALLED TO, READ BACK BY AND VERIFIED WITH: PHARMD C DAVIS [id number] AT 1406 BY CM    Staphylococcus epidermidis NOT DETECTED NOT DETECTED Final   Staphylococcus lugdunensis NOT DETECTED NOT DETECTED Final   Streptococcus species NOT DETECTED NOT DETECTED Final   Streptococcus agalactiae NOT DETECTED NOT DETECTED Final   Streptococcus pneumoniae NOT DETECTED NOT DETECTED Final   Streptococcus pyogenes NOT DETECTED NOT DETECTED Final   A.calcoaceticus-baumannii NOT DETECTED NOT DETECTED Final   Bacteroides fragilis NOT DETECTED NOT DETECTED Final   Enterobacterales NOT DETECTED NOT DETECTED Final   Enterobacter cloacae complex NOT DETECTED NOT DETECTED Final   Escherichia coli NOT DETECTED NOT DETECTED Final   Klebsiella aerogenes NOT DETECTED NOT DETECTED Final   Klebsiella oxytoca NOT DETECTED NOT DETECTED Final   Klebsiella pneumoniae NOT DETECTED NOT DETECTED Final   Proteus species NOT DETECTED NOT DETECTED Final   Salmonella species NOT DETECTED NOT DETECTED Final   Serratia marcescens NOT  DETECTED NOT DETECTED Final   Haemophilus influenzae NOT DETECTED NOT DETECTED Final   Neisseria meningitidis NOT DETECTED NOT DETECTED Final   Pseudomonas aeruginosa NOT DETECTED NOT DETECTED Final   Stenotrophomonas maltophilia NOT DETECTED NOT DETECTED Final   Candida albicans NOT DETECTED NOT DETECTED Final  Candida auris NOT DETECTED NOT DETECTED Final   Candida glabrata NOT DETECTED NOT DETECTED Final   Candida krusei NOT DETECTED NOT DETECTED Final   Candida parapsilosis NOT DETECTED NOT DETECTED Final   Candida tropicalis NOT DETECTED NOT DETECTED Final   Cryptococcus neoformans/gattii NOT DETECTED NOT DETECTED Final   Meth resistant mecA/C and MREJ NOT DETECTED NOT DETECTED Final    Comment: Performed at [facility], [place] [address] [place], [place] [address]  Culture, blood (Routine X 2) w Reflex to ID Panel     Status: None (Preliminary result)   Collection Time: 05/30/22  6:21 AM   Specimen: BLOOD  Result Value Ref Range Status   Specimen Description BLOOD RIGHT ANTECUBITAL  Final   Special Requests   Final    BOTTLES DRAWN AEROBIC AND ANAEROBIC Blood Culture adequate volume   Culture   Final    NO GROWTH 4 DAYS Performed at [facility], [place] [address], [place], [place] [address]    Report Status PENDING  Incomplete  Culture, blood (Routine X 2) w Reflex to ID Panel     Status: None (Preliminary result)   Collection Time: 05/30/22  6:26 AM   Specimen: BLOOD RIGHT HAND  Result Value Ref Range Status   Specimen Description BLOOD RIGHT HAND  Final   Special Requests   Final    BOTTLES DRAWN AEROBIC AND ANAEROBIC Blood Culture adequate volume   Culture   Final    NO GROWTH 4 DAYS Performed at [facility], [place] [address], [place], [place] [address]    Report Status PENDING  Incomplete         Radiology Studies: CT EBLOW RIGHT W CONTRAST  Result Date: 06/02/2022 CLINICAL DATA:  bacteremia, unclear source EXAM: CT OF THE UPPER RIGHT EXTREMITY  WITH CONTRAST TECHNIQUE: Multidetector CT imaging of the upper right extremity was performed according to the standard protocol following intravenous contrast administration. RADIATION DOSE REDUCTION: This exam was performed according to the departmental dose-optimization program which includes automated exposure control, adjustment of the mA and/or kV according to patient size and/or use of iterative reconstruction technique. CONTRAST:  155m OMNIPAQUE IOHEXOL 300 MG/ML  SOLN COMPARISON:  Radiograph 05/31/2022 FINDINGS: Bones/Joint/Cartilage There is no evidence of acute fracture. Alignment is normal. No joint effusion. Mild ulnar trochlear osteoarthritis. Ligaments Suboptimally assessed by CT. Muscles and Tendons No acute myotendinous abnormality by CT. There is large intramuscular lipoma of the triceps which is partially visualized. Soft tissues There is distension of the olecranon bursa measuring up to 3.1 x 0.9 x 3.2 cm. Mild adjacent soft tissue swelling. IMPRESSION: Olecranon bursitis with bursal distension measuring 3.1 x 0.9 x 3.2 cm. Mild adjacent soft tissue swelling. No acute osseous abnormality or joint effusion. Partially visualized large intramuscular lipoma in the triceps. Electronically Signed   By: JMaurine SimmeringM.D.   On: 06/02/2022 15:01    Scheduled Meds:  amLODipine  5 mg Oral BID   carvedilol  12.5 mg Oral BID WC   enoxaparin (LOVENOX) injection  40 mg Subcutaneous Q24H   irbesartan  300 mg Oral Daily   sodium chloride flush  3 mL Intravenous Q12H   trihexyphenidyl  2 mg Oral TID WC   Continuous Infusions:   ceFAZolin (ANCEF) IV 2 g (06/03/22 0532)     LOS: 6 days   Time spent: 441m  CaKayleen MemosDO [facility]  If 7PM-7AM, please contact night-coverage [email or web address]  06/03/2022, 1:07 PM

## 2022-06-03 NOTE — TOC Initial Note (Signed)
Transition of Care [facility]) - Initial/Assessment Note    Patient Details  Name: Kristin Gilmore MRN: [id number] Date of Birth: 1951-05-16  [facility] CM/SW Contact:    Tresa Endo Phone Number: 06/03/2022, 2:23 PM  Clinical Narrative:        CSW received SNF consult. CSW met with pt via phone. CSW introduced self and explained role at the hospital. Pt reports that PTA she live at home alone with the help pf some neighbors. PT reports that due to pain pt is not able to ambulate and has weakness. Pt is min guard and was only able to walk 4 ft with RW. Pt needs minimal assistance with ADLs.   CSW reviewed PT/OT recommendations for SNF. Pt reports she is wanting to go to [facility] for rehab. Pt gave CSW permission to fax out to facilities in the area with the primary chosice being [facility].   CSW will continue to follow.    Expected Discharge Plan: Skilled Nursing Facility Barriers to Discharge: Continued Medical Work up   Patient Goals and CMS Choice Patient states their goals for this hospitalization and ongoing recovery are:: Rehab CMS Medicare.gov Compare Post Acute Care list provided to:: Patient Choice offered to / list presented to : Patient  Expected Discharge Plan and Services Expected Discharge Plan: [place] In-house Referral: Clinical Social Work   Post Acute Care Choice: [place] Living arrangements for the past 2 months: [place]                                      Prior Living Arrangements/Services Living arrangements for the past 2 months: Single Family Home Lives with:: Self Patient language and need for interpreter reviewed:: Yes Do you feel safe going back to the place where you live?: Yes      Need for Family Participation in Patient Care: Yes (Comment) Care giver support system in place?: No (comment)   Criminal Activity/Legal Involvement Pertinent to Current  Situation/Hospitalization: No - Comment as needed  Activities of Daily Living Home Assistive Devices/Equipment: Blood pressure cuff, Eyeglasses, Cane (specify quad or straight), Walker (specify type) ADL Screening (condition at time of admission) Patient's cognitive ability adequate to safely complete daily activities?: Yes Is the patient deaf or have difficulty hearing?: No Does the patient have difficulty seeing, even when wearing glasses/contacts?: No Does the patient have difficulty concentrating, remembering, or making decisions?: No Patient able to express need for assistance with ADLs?: Yes Does the patient have difficulty dressing or bathing?: Yes Independently performs ADLs?: Yes (appropriate for developmental age) Does the patient have difficulty walking or climbing stairs?: Yes Weakness of Legs: Both Weakness of Arms/Hands: Both  Permission Sought/Granted Permission sought to share information with : Customer service manager, Other (comment) (Pt has friends and neighbors who look after her) Permission granted to share information with : Yes, Verbal Permission Granted     Permission granted to share info w AGENCY: SNF        Emotional Assessment Appearance:: Appears stated age Attitude/Demeanor/Rapport: Unable to Assess Affect (typically observed): Unable to Assess Orientation: : Oriented to Self, Oriented to Place, Oriented to  Time, Oriented to Situation Alcohol / Substance Use: Not Applicable Psych Involvement: No (comment)  Admission diagnosis:  Lumbar radicular pain [M54.16] Intractable back pain [M54.9] Sepsis ([place]) [A41.9] Patient Active Problem List   Diagnosis Date Noted  Lumbar radicular pain    AKI (acute kidney injury) ([place])    Bursitis of right elbow    Bacteremia due to methicillin susceptible Staphylococcus aureus (MSSA) 05/29/2022   SIRS (systemic inflammatory response syndrome) ([place]) 05/28/2022   Bipolar disorder, in partial remission, most  recent episode hypomanic ([place]) 05/28/2022   Hyperkalemia 05/28/2022   Dehydration 05/28/2022   Essential hypertension 05/28/2022   Intractable back pain 05/27/2022   Other spondylosis with radiculopathy, lumbar region 10/30/2021   Other secondary scoliosis, lumbar region 10/30/2021   Spondylolisthesis, lumbar region 10/30/2021   Spondylolisthesis of lumbosacral region 10/30/2021   Status post total right knee replacement 06/26/2021   Primary osteoarthritis of right knee 06/25/2021   Parkinson's disease ([place]) 01/24/2021   Trochanteric bursitis, left hip 01/18/2020   Lumbar spinal stenosis 12/19/2017   Pain in joint, ankle and foot 05/02/2016   Chronic venous insufficiency 05/02/2016   Peripheral edema 11/01/2015   Intractable chronic migraine without aura 11/02/2014   Tremor 04/13/2014   Migraine without aura 04/13/2014   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 [place] [address] Phone: [phone] Fax: [phone]  [id number] [place], [place] [place]: [phone]: [address]Phone: [phone]: 8[place] [facility]Phone: [phone]: [phone]    Social Determinants of Health (SDOH) Interventions    Readmission Risk Interventions     No data to display

## 2022-06-03 NOTE — Plan of Care (Signed)
Id brief note   Ct elbow showed olecronon bursitis  8/31 repeat bcx ngtd   A/p Mssa septicemia  -consider discussing case with ortho about new ct finding -continue current abx

## 2022-06-03 NOTE — Plan of Care (Signed)
  Problem: Nutrition: Goal: Adequate nutrition will be maintained Outcome: Progressing   Problem: Pain Managment: Goal: General experience of comfort will improve Outcome: Progressing   Problem: Skin Integrity: Goal: Risk for impaired skin integrity will decrease Outcome: Progressing   

## 2022-06-04 ENCOUNTER — Encounter (HOSPITAL_COMMUNITY): Payer: Self-pay | Admitting: Certified Registered Nurse Anesthetist

## 2022-06-04 ENCOUNTER — Inpatient Hospital Stay (HOSPITAL_COMMUNITY): Payer: Medicare Other

## 2022-06-04 ENCOUNTER — Encounter (HOSPITAL_COMMUNITY): Payer: Self-pay | Admitting: Internal Medicine

## 2022-06-04 ENCOUNTER — Telehealth: Payer: Self-pay

## 2022-06-04 ENCOUNTER — Encounter (HOSPITAL_COMMUNITY)
Admission: EM | Disposition: A | Discharge status: Skilled Nursing Facility | Payer: Self-pay | Source: Home / Self Care | Attending: Internal Medicine

## 2022-06-04 DIAGNOSIS — B9561 Methicillin susceptible Staphylococcus aureus infection as the cause of diseases classified elsewhere: Secondary | ICD-10-CM | POA: Diagnosis not present

## 2022-06-04 DIAGNOSIS — R7881 Bacteremia: Secondary | ICD-10-CM | POA: Diagnosis not present

## 2022-06-04 LAB — URINALYSIS, ROUTINE W REFLEX MICROSCOPIC
Bilirubin Urine: NEGATIVE
Glucose, UA: NEGATIVE mg/dL
Hgb urine dipstick: NEGATIVE
Ketones, ur: NEGATIVE mg/dL
Nitrite: NEGATIVE
Protein, ur: NEGATIVE mg/dL
Specific Gravity, Urine: <1.005 — ABNORMAL LOW (ref 1.005–1.030)
pH: 6 (ref 5.0–8.0)

## 2022-06-04 LAB — COMPREHENSIVE METABOLIC PANEL
ALT: 9 U/L (ref 0–44)
AST: 17 U/L (ref 15–41)
Albumin: 2.6 g/dL — ABNORMAL LOW (ref 3.5–5.0)
Alkaline Phosphatase: 93 U/L (ref 38–126)
Anion gap: 9 (ref 5–15)
BUN: 40 mg/dL — ABNORMAL HIGH (ref 8–23)
CO2: 28 mmol/L (ref 22–32)
Calcium: 8.4 mg/dL — ABNORMAL LOW (ref 8.9–10.3)
Chloride: 96 mmol/L — ABNORMAL LOW (ref 98–111)
Creatinine, Ser: 1.22 mg/dL — ABNORMAL HIGH (ref 0.44–1.00)
GFR, Estimated: 47 mL/min — ABNORMAL LOW (ref >60)
Glucose, Bld: 160 mg/dL — ABNORMAL HIGH (ref 70–99)
Potassium: 4.4 mmol/L (ref 3.5–5.1)
Sodium: 133 mmol/L — ABNORMAL LOW (ref 135–145)
Total Bilirubin: 0.3 mg/dL (ref 0.3–1.2)
Total Protein: 5.7 g/dL — ABNORMAL LOW (ref 6.5–8.1)

## 2022-06-04 LAB — CBC
HCT: 32.7 % — ABNORMAL LOW (ref 36.0–46.0)
Hemoglobin: 10.9 g/dL — ABNORMAL LOW (ref 12.0–15.0)
MCH: 31 pg (ref 26.0–34.0)
MCHC: 33.3 g/dL (ref 30.0–36.0)
MCV: 92.9 fL (ref 80.0–100.0)
Platelets: 325 10*3/uL (ref 150–400)
RBC: 3.52 MIL/uL — ABNORMAL LOW (ref 3.87–5.11)
RDW: 12.8 % (ref 11.5–15.5)
WBC: 8.1 10*3/uL (ref 4.0–10.5)
nRBC: 0 % (ref 0.0–0.2)

## 2022-06-04 LAB — CULTURE, BLOOD (ROUTINE X 2)
Culture: NO GROWTH
Culture: NO GROWTH
Special Requests: ADEQUATE
Special Requests: ADEQUATE

## 2022-06-04 LAB — URINALYSIS, MICROSCOPIC (REFLEX): Bacteria, UA: NONE SEEN

## 2022-06-04 SURGERY — CANCELLED PROCEDURE

## 2022-06-04 NOTE — Progress Notes (Signed)
Procedure cancelled (TEE) due to patient eating graham crackers this morning at 8am. Patient procedure rescheduled for tomorrow

## 2022-06-04 NOTE — Progress Notes (Signed)
Mobility Specialist Criteria Algorithm Info.   06/04/22 1115  Mobility  Activity Refused mobility   Patient adamantly refused stating "I haven't ate all day, I just want to sleep" and "no therapy today please". Will follow up as time permits.  Kristin Gilmore, [place], [place]  XTGGY:[phone] Office: [phone]

## 2022-06-04 NOTE — Telephone Encounter (Signed)
Silvestre Gunner, PA with [facility] wanted to let Dr. Louanne Skye know that CT Scan is complete for patient.  Patient is in room Many.  Please advise.  Thank you.

## 2022-06-04 NOTE — Progress Notes (Signed)
Brief ID Note:   Noted TEE pending for today. Ortho eval pending re: olecranon bursitis.   Will see patient back tomorrow 9/6 for further recommendations after the aforementioned information available.     Janene Madeira, MSN, NP-C [facility] [email or web address] Pager: [phone] Office: [phone] [facility] Main Line: [place] Communication Welcome

## 2022-06-04 NOTE — Progress Notes (Signed)
PROGRESS NOTE    Kristin Gilmore  CEY:[id number] DOB: 15-Mar-1951 DOA: 05/27/2022 PCP: [facility] [place]   Brief Narrative:  Kristin Gilmore is Kristin Gilmore 71 y.o. female with medical history significant of hypertension, dyslipidemia, prediabetes, anxiety, depression who presents with complaints of worsening back pain, denies trauma. Patient reports having severe pain in her lower back with radiation down the left leg. Sees Dr. Louanne Skye and has issues with chronic pain in this area.  They were trying to arrange for her to have an outpatient nerve block, but have been waiting on insurance approval.  She reports that she last had surgery of her lumbar spine back in January of this year.  She denies having any significant fevers, chills, saddle anesthesia, or  incontinence.  06/03/2022: The patient was seen and examined at bedside.  Reports pain in her right knee and lower back.  X-ray done on 04/09/2022 showed fracture to the lateral portion of the patella.  Component looks stable.  Fracture appears to be chronic.  Had an MRI lumbar spine with and without contrast on 05/30/2022.  Assessment & Plan:   Principal Problem:   Bacteremia due to methicillin susceptible Staphylococcus aureus (MSSA) Active Problems:   Intractable back pain   SIRS (systemic inflammatory response syndrome) (HCC)   Lumbar spinal stenosis   Parkinson's disease (HCC)   Bipolar disorder, in partial remission, most recent episode hypomanic ([place])   Hyperkalemia   Dehydration   Essential hypertension   Lumbar radicular pain   AKI (acute kidney injury) ([place])   Bursitis of right elbow  Sepsis secondary to MSSA bacteremia, POA Unclear source Blood cultures positive for MSSA, pansensitive.   ID following  Continue cefazolin as recommended by ID.   -Multiple imaging looking for source of infection thus far been unrevealing. -ESR 41, CRP 32, lactic acid normal, INR normal -TTE performed previously although  patient became somewhat combative and the test was not completed, cardiology able to read what imaging was taken without any overt findings of valvular abnormalities. -CT of left elbow 06/02/2022 showed olecranon bursitis with bursal distension measuring 3.1 x 0.9 x 3.2 cm. Mild adjacent soft tissue swelling.  I discussed with orthopedics, they'll probably evaluate 9/6.  Also has significant RLE knee pain (has hx right knee replacement), discussed with orthopedics as well, will follow recs TEE planned on 06/04/2022, delayed bc she ate graham cracker   Essential hypertension BP stable Continue home oral antihypertensives Continue to monitor vital signs.   Chronic anxiety and depression -Currently on Xanax only, med rec pending for patient's chronic medications -Current unconfirmed medications listed include amitriptyline, Seroquel, Effexor   Prediabetes Last hemoglobin A1c 5.8 on 06/22/2021.   Home medication regimen appears to include glipizide. Hold off oral hypoglycemics Diet-controlled   Parkinson's disease Continue Home medication regimen includes Artane 2 mg 3 times daily.  Lower back pain; acute on chronic, chronic history of lumbar stenosis with radiculopathy, POA - Acute on chronic pain without deficits or paresthesias. - Initial plan for neurology evaluation and potential nerve block however this appears to have been done previously this month with minimal to no improvement, as such will not be repeated given limited therapeutics but increased risk of complications. -MRI without overt findings to explain worsening back pain - discussed with ortho -Continue home pain meds including hydrocodone and p.o. Dilaudid - appears to be moderately well controlled at this time, not pain-free but again near her baseline. -Dr. Louanne Skye orthopedics following, appreciate insight and recommendations -  likely require outpatient follow up given no indication for procedure/intervention at this time  DVT  prophylaxis: Subcu Lovenox daily Code Status: Full Family Communication: None present  Status is: Inpatient  Dispo: The patient is from: Home              Anticipated d/c is to: Home              Anticipated d/c date is: 06/06/22 after TEE              Patient currently not medically stable for discharge  Consultants:  Orthopedic surgery, cardiology, infectious disease  Procedures:  None  Antimicrobials:  cefazolin  Subjective: No acute issues or events overnight  Objective: Vitals:   06/03/22 2105 06/04/22 0642 06/04/22 0845 06/04/22 1127  BP: (!) 144/81 (!) 135/98 (!) 143/60 (!) 149/120  Pulse: 65 72 91 81  Resp: '18 18 17 15  ' Temp: 98.1 F (36.7 C) 98.4 F (36.9 C) 98.6 F (37 C) 98 F (36.7 C)  TempSrc: Oral Oral Oral Temporal  SpO2: 94% 97% 91% 92%  Weight:      Height:        Intake/Output Summary (Last 24 hours) at 06/04/2022 1957 Last data filed at 06/04/2022 1210 Gross per 24 hour  Intake 543 ml  Output --  Net 543 ml   Filed Weights   05/27/22 0757 05/30/22 0806  Weight: 72.6 kg 73.5 kg    Examination:  General: Pleasant well-developed well-nourished in no acute distress.  She is alert and oriented x3 HEENT: Normocephalic atraumatic.   Extraocular movements intact bilaterally. Cardiothoracic: Regular rate and rhythm no rubs or gallops. Neck:  Without mass or deformity.  Trachea is midline. Abdomen: Nondistended.  Bowel sounds present. Extremities: No lower extremity edema Vascular: Good pulses.  Skin:  Warm and dry, no erythema, no ulcerations.  Data Reviewed: I have personally reviewed following labs and imaging studies  CBC: Recent Labs  Lab 05/30/22 0621 05/31/22 0737 06/01/22 0626 06/02/22 0649 06/04/22 0609  WBC 12.1* 9.3 13.3* 11.8* 8.1  HGB 10.9* 11.4* 12.1 12.2 10.9*  HCT 31.9* 33.1* 37.0 36.5 32.7*  MCV 92.7 91.4 92.7 91.9 92.9  PLT 213 242 339 312 989   Basic Metabolic Panel: Recent Labs  Lab 05/30/22 0621 05/31/22 0737  06/01/22 0626 06/02/22 0649 06/04/22 0609  NA 135 136 138 134* 133*  K 4.3 4.0 4.4 4.2 4.4  CL 101 101 101 96* 96*  CO2 '25 24 29 26 28  ' GLUCOSE 165* 188* 132* 165* 160*  BUN 17 26* 25* 26* 40*  CREATININE 1.00 0.92 0.89 1.01* 1.22*  CALCIUM 8.7* 9.0 9.4 8.9 8.4*   GFR: Estimated Creatinine Clearance: 40.5 mL/min (Orman Matsumura) (by C-G formula based on SCr of 1.22 mg/dL (H)). Liver Function Tests: Recent Labs  Lab 05/30/22 0621 05/31/22 0737 06/01/22 0626 06/02/22 0649 06/04/22 0609  AST '18 15 17 ' 13* 17  ALT '20 17 13 11 9  ' ALKPHOS 73 77 84 78 93  BILITOT 0.5 0.3 0.4 0.5 0.3  PROT 6.1* 6.2* 6.8 6.4* 5.7*  ALBUMIN 2.7* 2.7* 3.0* 2.8* 2.6*   No results for input(s): "LIPASE", "AMYLASE" in the last 168 hours. No results for input(s): "AMMONIA" in the last 168 hours. Coagulation Profile: No results for input(s): "INR", "PROTIME" in the last 168 hours.  Cardiac Enzymes: No results for input(s): "CKTOTAL", "CKMB", "CKMBINDEX", "TROPONINI" in the last 168 hours. BNP (last 3 results) No results for input(s): "PROBNP" in the last 8760 hours.  HbA1C: No results for input(s): "HGBA1C" in the last 72 hours. CBG: No results for input(s): "GLUCAP" in the last 168 hours. Lipid Profile: No results for input(s): "CHOL", "HDL", "LDLCALC", "TRIG", "CHOLHDL", "LDLDIRECT" in the last 72 hours. Thyroid Function Tests: No results for input(s): "TSH", "T4TOTAL", "FREET4", "T3FREE", "THYROIDAB" in the last 72 hours. Anemia Panel: No results for input(s): "VITAMINB12", "FOLATE", "FERRITIN", "TIBC", "IRON", "RETICCTPCT" in the last 72 hours. Sepsis Labs: No results for input(s): "PROCALCITON", "LATICACIDVEN" in the last 168 hours.   Recent Results (from the past 240 hour(s))  Resp Panel by RT-PCR (Flu Mazzy Santarelli&B, Covid) Urine, Clean Catch     Status: None   Collection Time: 05/27/22  8:11 PM   Specimen: Urine, Clean Catch; Nasal Swab  Result Value Ref Range Status   SARS Coronavirus 2 by RT PCR NEGATIVE  NEGATIVE Final    Comment: (NOTE) SARS-CoV-2 target nucleic acids are NOT DETECTED.  The SARS-CoV-2 RNA is generally detectable in upper respiratory specimens during the acute phase of infection. The lowest concentration of SARS-CoV-2 viral copies this assay can detect is 138 copies/mL. Bonnye Halle negative result does not preclude SARS-Cov-2 infection and should not be used as the sole basis for treatment or other patient management decisions. Kerah Hardebeck negative result may occur with  improper specimen collection/handling, submission of specimen other than nasopharyngeal swab, presence of viral mutation(s) within the areas targeted by this assay, and inadequate number of viral copies(<138 copies/mL). Delene Morais negative result must be combined with clinical observations, patient history, and epidemiological information. The expected result is Negative.  Fact Sheet for Patients:  EntrepreneurPulse.com.au  Fact Sheet for Healthcare Providers:  IncredibleEmployment.be  This test is no t yet approved or cleared by the [place] FDA and  has been authorized for detection and/or diagnosis of SARS-CoV-2 by FDA under an Emergency Use Authorization (EUA). This EUA will remain  in effect (meaning this test can be used) for the duration of the COVID-19 declaration under Section 564(b)(1) of the Act, 21 U.S.C.section 360bbb-3(b)(1), unless the authorization is terminated  or revoked sooner.       Influenza Breane Grunwald by PCR NEGATIVE NEGATIVE Final   Influenza B by PCR NEGATIVE NEGATIVE Final    Comment: (NOTE) The Xpert Xpress SARS-CoV-2/FLU/RSV plus assay is intended as an aid in the diagnosis of influenza from Nasopharyngeal swab specimens and should not be used as Yasmene Salomone sole basis for treatment. Nasal washings and aspirates are unacceptable for Xpert Xpress SARS-CoV-2/FLU/RSV testing.  Fact Sheet for Patients: EntrepreneurPulse.com.au  Fact Sheet for Healthcare  Providers: IncredibleEmployment.be  This test is not yet approved or cleared by the [place] FDA and has been authorized for detection and/or diagnosis of SARS-CoV-2 by FDA under an Emergency Use Authorization (EUA). This EUA will remain in effect (meaning this test can be used) for the duration of the COVID-19 declaration under Section 564(b)(1) of the Act, 21 U.S.C. section 360bbb-3(b)(1), unless the authorization is terminated or revoked.  Performed at [facility], [address], [place], [place] [address]   Culture, blood (x 2)     Status: Abnormal   Collection Time: 05/28/22 10:38 AM   Specimen: BLOOD RIGHT ARM  Result Value Ref Range Status   Specimen Description BLOOD RIGHT ARM  Final   Special Requests   Final    BOTTLES DRAWN AEROBIC AND ANAEROBIC Blood Culture adequate volume   Culture  Setup Time   Final    GRAM POSITIVE COCCI IN CLUSTERS BOTTLES DRAWN AEROBIC ONLY CRITICAL RESULT  CALLED TO, READ BACK BY AND VERIFIED WITH: PHARMD FRedmond Pulling [phone] '@1638'  FH    Culture (Zaylon Bossier)  Final    STAPHYLOCOCCUS AUREUS SUSCEPTIBILITIES PERFORMED ON PREVIOUS CULTURE WITHIN THE LAST 5 DAYS. Performed at [facility], [place] [address], [place], [place] [address]    Report Status 05/31/2022 FINAL  Final  Culture, blood (x 2)     Status: Abnormal   Collection Time: 05/28/22 10:41 AM   Specimen: BLOOD LEFT ARM  Result Value Ref Range Status   Specimen Description BLOOD LEFT ARM  Final   Special Requests   Final    BOTTLES DRAWN AEROBIC AND ANAEROBIC Blood Culture adequate volume   Culture  Setup Time   Final    GRAM POSITIVE COCCI ANAEROBIC BOTTLE ONLY CRITICAL RESULT CALLED TO, READ BACK BY AND VERIFIED WITH: PHARMD C DAVIS [id number] AT 1406 BY CM Performed at [facility], [place] [address] [place], [place] [address]    Culture STAPHYLOCOCCUS AUREUS (Quetzaly Ebner)  Final   Report Status 05/31/2022 FINAL  Final   Organism ID, Bacteria  STAPHYLOCOCCUS AUREUS  Final      Susceptibility   Staphylococcus aureus - MIC*    CIPROFLOXACIN <=0.5 SENSITIVE Sensitive     ERYTHROMYCIN 0.5 SENSITIVE Sensitive     GENTAMICIN <=0.5 SENSITIVE Sensitive     OXACILLIN 0.5 SENSITIVE Sensitive     TETRACYCLINE <=1 SENSITIVE Sensitive     VANCOMYCIN 1 SENSITIVE Sensitive     TRIMETH/SULFA <=10 SENSITIVE Sensitive     CLINDAMYCIN <=0.25 SENSITIVE Sensitive     RIFAMPIN <=0.5 SENSITIVE Sensitive     Inducible Clindamycin NEGATIVE Sensitive     * STAPHYLOCOCCUS AUREUS  Blood Culture ID Panel (Reflexed)     Status: Abnormal   Collection Time: 05/28/22 10:41 AM  Result Value Ref Range Status   Enterococcus faecalis NOT DETECTED NOT DETECTED Final   Enterococcus Faecium NOT DETECTED NOT DETECTED Final   Listeria monocytogenes NOT DETECTED NOT DETECTED Final   Staphylococcus species DETECTED (Travers Goodley) NOT DETECTED Final    Comment: CRITICAL RESULT CALLED TO, READ BACK BY AND VERIFIED WITH: PHARMD C DAVID [id number] AT 1406 BY CM    Staphylococcus aureus (BCID) DETECTED (Erol Flanagin) NOT DETECTED Final    Comment: CRITICAL RESULT CALLED TO, READ BACK BY AND VERIFIED WITH: PHARMD C DAVIS [id number] AT 1406 BY CM    Staphylococcus epidermidis NOT DETECTED NOT DETECTED Final   Staphylococcus lugdunensis NOT DETECTED NOT DETECTED Final   Streptococcus species NOT DETECTED NOT DETECTED Final   Streptococcus agalactiae NOT DETECTED NOT DETECTED Final   Streptococcus pneumoniae NOT DETECTED NOT DETECTED Final   Streptococcus pyogenes NOT DETECTED NOT DETECTED Final   Alondra Sahni.calcoaceticus-baumannii NOT DETECTED NOT DETECTED Final   Bacteroides fragilis NOT DETECTED NOT DETECTED Final   Enterobacterales NOT DETECTED NOT DETECTED Final   Enterobacter cloacae complex NOT DETECTED NOT DETECTED Final   Escherichia coli NOT DETECTED NOT DETECTED Final   Klebsiella aerogenes NOT DETECTED NOT DETECTED Final   Klebsiella oxytoca NOT DETECTED NOT DETECTED Final   Klebsiella  pneumoniae NOT DETECTED NOT DETECTED Final   Proteus species NOT DETECTED NOT DETECTED Final   Salmonella species NOT DETECTED NOT DETECTED Final   Serratia marcescens NOT DETECTED NOT DETECTED Final   Haemophilus influenzae NOT DETECTED NOT DETECTED Final   Neisseria meningitidis NOT DETECTED NOT DETECTED Final   Pseudomonas aeruginosa NOT DETECTED NOT DETECTED Final   Stenotrophomonas maltophilia NOT DETECTED NOT DETECTED Final   Candida albicans  NOT DETECTED NOT DETECTED Final   Candida auris NOT DETECTED NOT DETECTED Final   Candida glabrata NOT DETECTED NOT DETECTED Final   Candida krusei NOT DETECTED NOT DETECTED Final   Candida parapsilosis NOT DETECTED NOT DETECTED Final   Candida tropicalis NOT DETECTED NOT DETECTED Final   Cryptococcus neoformans/gattii NOT DETECTED NOT DETECTED Final   Meth resistant mecA/C and MREJ NOT DETECTED NOT DETECTED Final    Comment: Performed at [facility], [place] [address], [place], [place] [address]  Culture, blood (Routine X 2) w Reflex to ID Panel     Status: None   Collection Time: 05/30/22  6:21 AM   Specimen: BLOOD  Result Value Ref Range Status   Specimen Description BLOOD RIGHT ANTECUBITAL  Final   Special Requests   Final    BOTTLES DRAWN AEROBIC AND ANAEROBIC Blood Culture adequate volume   Culture   Final    NO GROWTH 5 DAYS Performed at [facility], [place] [address], [place], [place] [address]    Report Status 06/04/2022 FINAL  Final  Culture, blood (Routine X 2) w Reflex to ID Panel     Status: None   Collection Time: 05/30/22  6:26 AM   Specimen: BLOOD RIGHT HAND  Result Value Ref Range Status   Specimen Description BLOOD RIGHT HAND  Final   Special Requests   Final    BOTTLES DRAWN AEROBIC AND ANAEROBIC Blood Culture adequate volume   Culture   Final    NO GROWTH 5 DAYS Performed at [facility], [place] [address], [place], [place] [address]    Report Status 06/04/2022 FINAL  Final         Radiology  Studies: No results found.  Scheduled Meds:  amLODipine  5 mg Oral BID   carvedilol  12.5 mg Oral BID WC   enoxaparin (LOVENOX) injection  40 mg Subcutaneous Q24H   irbesartan  300 mg Oral Daily   sodium chloride flush  3 mL Intravenous Q12H   trihexyphenidyl  2 mg Oral TID WC   Continuous Infusions:   ceFAZolin (ANCEF) IV 2 g (06/04/22 1316)     LOS: 7 days   Time spent: 47mn  Caldwell Powell, DO [facility]  If 7PM-7AM, please contact night-coverage [email or web address]  06/04/2022, 7:57 PM

## 2022-06-05 ENCOUNTER — Inpatient Hospital Stay (HOSPITAL_COMMUNITY): Payer: Medicare Other | Admitting: Anesthesiology

## 2022-06-05 ENCOUNTER — Encounter (HOSPITAL_COMMUNITY)
Admission: EM | Disposition: A | Discharge status: Skilled Nursing Facility | Payer: Self-pay | Source: Home / Self Care | Attending: Internal Medicine

## 2022-06-05 ENCOUNTER — Encounter (HOSPITAL_COMMUNITY): Payer: Self-pay | Admitting: Internal Medicine

## 2022-06-05 ENCOUNTER — Inpatient Hospital Stay (HOSPITAL_COMMUNITY): Payer: Medicare Other

## 2022-06-05 DIAGNOSIS — R7881 Bacteremia: Secondary | ICD-10-CM

## 2022-06-05 DIAGNOSIS — D649 Anemia, unspecified: Secondary | ICD-10-CM | POA: Diagnosis not present

## 2022-06-05 DIAGNOSIS — I10 Essential (primary) hypertension: Secondary | ICD-10-CM | POA: Diagnosis not present

## 2022-06-05 DIAGNOSIS — B9561 Methicillin susceptible Staphylococcus aureus infection as the cause of diseases classified elsewhere: Secondary | ICD-10-CM | POA: Diagnosis not present

## 2022-06-05 DIAGNOSIS — I340 Nonrheumatic mitral (valve) insufficiency: Secondary | ICD-10-CM

## 2022-06-05 DIAGNOSIS — M1990 Unspecified osteoarthritis, unspecified site: Secondary | ICD-10-CM | POA: Diagnosis not present

## 2022-06-05 DIAGNOSIS — M5416 Radiculopathy, lumbar region: Secondary | ICD-10-CM | POA: Diagnosis not present

## 2022-06-05 HISTORY — PX: TEE WITHOUT CARDIOVERSION: SHX5443

## 2022-06-05 SURGERY — ECHOCARDIOGRAM, TRANSESOPHAGEAL
Anesthesia: Monitor Anesthesia Care

## 2022-06-05 MED ORDER — LIDOCAINE 2% (20 MG/ML) 5 ML SYRINGE
INTRAMUSCULAR | Status: DC | PRN
  Administered 2022-06-05: 60 mg via INTRAVENOUS

## 2022-06-05 MED ORDER — LACTATED RINGERS IV SOLN: INTRAVENOUS | Status: DC | PRN

## 2022-06-05 MED ORDER — SODIUM CHLORIDE 0.9 % IV SOLN
INTRAVENOUS | Status: AC | PRN
  Administered 2022-06-05: 500 mL via INTRAVENOUS

## 2022-06-05 MED ORDER — LIDOCAINE VISCOUS HCL 2 % MT SOLN
15.0000 mL | Freq: Once | OROMUCOSAL | Status: DC
  Filled 2022-06-05: qty 15

## 2022-06-05 MED ORDER — ALUM & MAG HYDROXIDE-SIMETH 200-200-20 MG/5ML PO SUSP
30.0000 mL | Freq: Once | ORAL | Status: AC
  Administered 2022-06-05: 30 mL via ORAL

## 2022-06-05 MED ORDER — PROPOFOL 10 MG/ML IV BOLUS
INTRAVENOUS | Status: DC | PRN
  Administered 2022-06-05: 20 mg via INTRAVENOUS
  Administered 2022-06-05: 30 mg via INTRAVENOUS
  Administered 2022-06-05: 50 mg via INTRAVENOUS

## 2022-06-05 MED ORDER — POLYETHYLENE GLYCOL 3350 17 G PO PACK
17.0000 g | PACK | Freq: Every day | ORAL | Status: DC
Start: 2022-06-05 — End: 2022-06-08
  Administered 2022-06-08: 17 g via ORAL
  Filled 2022-06-05 (×4): qty 1

## 2022-06-05 MED ORDER — DOCUSATE SODIUM 100 MG PO CAPS
100.0000 mg | ORAL_CAPSULE | Freq: Two times a day (BID) | ORAL | Status: DC
  Administered 2022-06-05 – 2022-06-08 (×6): 100 mg via ORAL
  Filled 2022-06-05 (×6): qty 1

## 2022-06-05 MED ORDER — PROPOFOL 500 MG/50ML IV EMUL
INTRAVENOUS | Status: DC | PRN
  Administered 2022-06-05: 100 ug/kg/min via INTRAVENOUS

## 2022-06-05 NOTE — Anesthesia Preprocedure Evaluation (Signed)
Anesthesia Evaluation  Patient identified by MRN, date of birth, ID band Patient awake    Reviewed: Allergy & Precautions  Airway Mallampati: II       Dental   Pulmonary pneumonia,    breath sounds clear to auscultation       Cardiovascular hypertension,  Rhythm:Regular Rate:Normal     Neuro/Psych  Headaches,  Neuromuscular disease    GI/Hepatic negative GI ROS, Neg liver ROS,   Endo/Other    Renal/GU Renal disease     Musculoskeletal  (+) Arthritis ,   Abdominal   Peds  Hematology   Anesthesia Other Findings   Reproductive/Obstetrics                             Anesthesia Physical Anesthesia Plan  ASA: 3  Anesthesia Plan: MAC   Post-op Pain Management:    Induction:   PONV Risk Score and Plan: 2 and Propofol infusion  Airway Management Planned: Nasal Cannula and Simple Face Mask  Additional Equipment:   Intra-op Plan:   Post-operative Plan:   Informed Consent: I have reviewed the patients History and Physical, chart, labs and discussed the procedure including the risks, benefits and alternatives for the proposed anesthesia with the patient or authorized representative who has indicated his/her understanding and acceptance.     Dental advisory given  Plan Discussed with: Anesthesiologist and CRNA  Anesthesia Plan Comments:         Anesthesia Quick Evaluation

## 2022-06-05 NOTE — Progress Notes (Signed)
OT Cancellation Note  Patient Details Name: SEMIYAH NEWGENT MRN: [id number] DOB: 10/18/1950   Cancelled Treatment:    Reason Eval/Treat Not Completed: Patient declined, no reason specified (attempted x2 this afternoon, pt refusing stating she wants to rest, and is in too much pain. Will follow.)  Lynnda Child, OTD, OTR/L Acute Rehab [phone] - [place] 06/05/2022, 3:57 PM

## 2022-06-05 NOTE — Anesthesia Procedure Notes (Signed)
Procedure Name: MAC Date/Time: 06/05/2022 11:30 AM  Performed by: Anastasio Auerbach, CRNAPre-anesthesia Checklist: Emergency Drugs available, Suction available and Patient being monitored Oxygen Delivery Method: Nasal cannula Induction Type: IV induction Airway Equipment and Method: Bite block

## 2022-06-05 NOTE — Progress Notes (Signed)
PROGRESS NOTE    Kristin Gilmore  QBH:[id number] DOB: December 22, 1950 DOA: 05/27/2022 PCP: [facility] @ [place]   Brief Narrative:  Kristin Gilmore is Kristin Gilmore 71 y.o. female with medical history significant of hypertension, dyslipidemia, prediabetes, anxiety, depression who presents with complaints of worsening back pain, denies trauma. Patient reports having severe pain in her lower back with radiation down the left leg. Sees Dr. Louanne Skye and has issues with chronic pain in this area.  They were trying to arrange for her to have an outpatient nerve block, but have been waiting on insurance approval.  She reports that she last had surgery of her lumbar spine back in January of this year.  She denies having any significant fevers, chills, saddle anesthesia, or  incontinence.  06/03/2022: The patient was seen and examined at bedside.  Reports pain in her right knee and lower back.  X-ray done on 04/09/2022 showed fracture to the lateral portion of the patella.  Component looks stable.  Fracture appears to be chronic.  Had an MRI lumbar spine with and without contrast on 05/30/2022.  Assessment & Plan:   Principal Problem:   Bacteremia due to methicillin susceptible Staphylococcus aureus (MSSA) Active Problems:   Intractable back pain   SIRS (systemic inflammatory response syndrome) (HCC)   Lumbar spinal stenosis   Parkinson's disease (HCC)   Bipolar disorder, in partial remission, most recent episode hypomanic ([place])   Hyperkalemia   Dehydration   Essential hypertension   Lumbar radicular pain   AKI (acute kidney injury) ([place])   Bursitis of right elbow  Chest pain Unclear cause, now resolved.  She notes she has this intermittently, is not interested in working this up further. She refused EKG ordered to w/u.  CXR without acute cardiopulm abnormality. TEE today without noted wall motion abnormality  Sepsis secondary to MSSA bacteremia, POA Unclear source Blood cultures  positive for MSSA, pansensitive.   ID following  Continue cefazolin as recommended by ID.   -Multiple imaging looking for source of infection thus far been unrevealing. -ESR 41, CRP 32, lactic acid normal, INR normal -TTE performed previously although patient became somewhat combative and the test was not completed, cardiology able to read what imaging was taken without any overt findings of valvular abnormalities. -CT of left elbow 06/02/2022 showed olecranon bursitis with bursal distension measuring 3.1 x 0.9 x 3.2 cm. Mild adjacent soft tissue swelling.  I discussed with orthopedics, they'll probably evaluate 9/6.  Also has significant RLE knee pain (has hx right knee replacement), discussed with orthopedics as well, will follow recs TEE without evidence of vegetation/infective endocarditis   Essential hypertension BP stable Continue home oral antihypertensives Continue to monitor vital signs.   Chronic anxiety and depression -Currently on Xanax only, med rec pending for patient's chronic medications -Current unconfirmed medications listed include amitriptyline, Seroquel, Effexor   Prediabetes Last hemoglobin A1c 5.8 on 06/22/2021.   Home medication regimen appears to include glipizide. Hold off oral hypoglycemics Diet-controlled   Parkinson's disease Continue Home medication regimen includes Artane 2 mg 3 times daily.  Lower back pain; acute on chronic, chronic history of lumbar stenosis with radiculopathy, POA - Acute on chronic pain without deficits or paresthesias. - Initial plan for neurology evaluation and potential nerve block however this appears to have been done previously this month with minimal to no improvement, as such will not be repeated given limited therapeutics but increased risk of complications. -MRI without overt findings to explain worsening back pain -  discussed with ortho -Continue home pain meds including hydrocodone and p.o. Dilaudid - appears to be  moderately well controlled at this time, not pain-free but again near her baseline. -Dr. Louanne Skye orthopedics following, appreciate insight and recommendations - likely require outpatient follow up given no indication for procedure/intervention at this time  DVT prophylaxis: Subcu Lovenox daily Code Status: Full Family Communication: None present  Status is: Inpatient  Dispo: The patient is from: Home              Anticipated d/c is to: Home              Anticipated d/c date is: 06/06/22 after TEE              Patient currently not medically stable for discharge  Consultants:  Orthopedic surgery, cardiology, infectious disease  Procedures:  None  Antimicrobials:  cefazolin  Subjective: C/o chest pain to nurses earlier Doesn't want this evaluated any further Refused EKG earlier, doesn't think this is related to her heart  Objective: Vitals:   06/05/22 1022 06/05/22 1154 06/05/22 1204 06/05/22 1225  BP: 135/63 128/61 100/76 (!) 140/69  Pulse: 66 67 60 62  Resp: '14 16 19 16  ' Temp:  98 F (36.7 C)  98 F (36.7 C)  TempSrc:  Temporal  Oral  SpO2: 98% 96% 95% 92%  Weight:      Height:        Intake/Output Summary (Last 24 hours) at 06/05/2022 1519 Last data filed at 06/05/2022 1154 Gross per 24 hour  Intake 200 ml  Output 200 ml  Net 0 ml   Filed Weights   05/27/22 0757 05/30/22 0806  Weight: 72.6 kg 73.5 kg    Examination:  General: No acute distress. Cardiovascular: RRR Lungs: unlabored Abdomen: Soft, nontender, nondistended  Neurological: Alert and oriented 3. Moves all extremities 4 with equal strength. Cranial nerves II through XII grossly intact. Extremities: no TTP to R knee today  Data Reviewed: I have personally reviewed following labs and imaging studies  CBC: Recent Labs  Lab 05/30/22 0621 05/31/22 0737 06/01/22 0626 06/02/22 0649 06/04/22 0609  WBC 12.1* 9.3 13.3* 11.8* 8.1  HGB 10.9* 11.4* 12.1 12.2 10.9*  HCT 31.9* 33.1* 37.0 36.5 32.7*   MCV 92.7 91.4 92.7 91.9 92.9  PLT 213 242 339 312 366   Basic Metabolic Panel: Recent Labs  Lab 05/30/22 0621 05/31/22 0737 06/01/22 0626 06/02/22 0649 06/04/22 0609  NA 135 136 138 134* 133*  K 4.3 4.0 4.4 4.2 4.4  CL 101 101 101 96* 96*  CO2 '25 24 29 26 28  ' GLUCOSE 165* 188* 132* 165* 160*  BUN 17 26* 25* 26* 40*  CREATININE 1.00 0.92 0.89 1.01* 1.22*  CALCIUM 8.7* 9.0 9.4 8.9 8.4*   GFR: Estimated Creatinine Clearance: 40.5 mL/min (Cherese Lozano) (by C-G formula based on SCr of 1.22 mg/dL (H)). Liver Function Tests: Recent Labs  Lab 05/30/22 0621 05/31/22 0737 06/01/22 0626 06/02/22 0649 06/04/22 0609  AST '18 15 17 ' 13* 17  ALT '20 17 13 11 9  ' ALKPHOS 73 77 84 78 93  BILITOT 0.5 0.3 0.4 0.5 0.3  PROT 6.1* 6.2* 6.8 6.4* 5.7*  ALBUMIN 2.7* 2.7* 3.0* 2.8* 2.6*   No results for input(s): "LIPASE", "AMYLASE" in the last 168 hours. No results for input(s): "AMMONIA" in the last 168 hours. Coagulation Profile: No results for input(s): "INR", "PROTIME" in the last 168 hours.  Cardiac Enzymes: No results for input(s): "CKTOTAL", "CKMB", "CKMBINDEX", "TROPONINI"  in the last 168 hours. BNP (last 3 results) No results for input(s): "PROBNP" in the last 8760 hours. HbA1C: No results for input(s): "HGBA1C" in the last 72 hours. CBG: No results for input(s): "GLUCAP" in the last 168 hours. Lipid Profile: No results for input(s): "CHOL", "HDL", "LDLCALC", "TRIG", "CHOLHDL", "LDLDIRECT" in the last 72 hours. Thyroid Function Tests: No results for input(s): "TSH", "T4TOTAL", "FREET4", "T3FREE", "THYROIDAB" in the last 72 hours. Anemia Panel: No results for input(s): "VITAMINB12", "FOLATE", "FERRITIN", "TIBC", "IRON", "RETICCTPCT" in the last 72 hours. Sepsis Labs: No results for input(s): "PROCALCITON", "LATICACIDVEN" in the last 168 hours.   Recent Results (from the past 240 hour(s))  Resp Panel by RT-PCR (Flu Nyheem Binette&B, Covid) Urine, Clean Catch     Status: None   Collection Time:  05/27/22  8:11 PM   Specimen: Urine, Clean Catch; Nasal Swab  Result Value Ref Range Status   SARS Coronavirus 2 by RT PCR NEGATIVE NEGATIVE Final    Comment: (NOTE) SARS-CoV-2 target nucleic acids are NOT DETECTED.  The SARS-CoV-2 RNA is generally detectable in upper respiratory specimens during the acute phase of infection. The lowest concentration of SARS-CoV-2 viral copies this assay can detect is 138 copies/mL. Zarea Diesing negative result does not preclude SARS-Cov-2 infection and should not be used as the sole basis for treatment or other patient management decisions. Natayla Cadenhead negative result may occur with  improper specimen collection/handling, submission of specimen other than nasopharyngeal swab, presence of viral mutation(s) within the areas targeted by this assay, and inadequate number of viral copies(<138 copies/mL). Fahad Cisse negative result must be combined with clinical observations, patient history, and epidemiological information. The expected result is Negative.  Fact Sheet for Patients:  EntrepreneurPulse.com.au  Fact Sheet for Healthcare Providers:  IncredibleEmployment.be  This test is no t yet approved or cleared by the [place] FDA and  has been authorized for detection and/or diagnosis of SARS-CoV-2 by FDA under an Emergency Use Authorization (EUA). This EUA will remain  in effect (meaning this test can be used) for the duration of the COVID-19 declaration under Section 564(b)(1) of the Act, 21 U.S.C.section 360bbb-3(b)(1), unless the authorization is terminated  or revoked sooner.       Influenza Dhamar Gregory by PCR NEGATIVE NEGATIVE Final   Influenza B by PCR NEGATIVE NEGATIVE Final    Comment: (NOTE) The Xpert Xpress SARS-CoV-2/FLU/RSV plus assay is intended as an aid in the diagnosis of influenza from Nasopharyngeal swab specimens and should not be used as Elizbeth Posa sole basis for treatment. Nasal washings and aspirates are unacceptable for Xpert  Xpress SARS-CoV-2/FLU/RSV testing.  Fact Sheet for Patients: EntrepreneurPulse.com.au  Fact Sheet for Healthcare Providers: IncredibleEmployment.be  This test is not yet approved or cleared by the [place] FDA and has been authorized for detection and/or diagnosis of SARS-CoV-2 by FDA under an Emergency Use Authorization (EUA). This EUA will remain in effect (meaning this test can be used) for the duration of the COVID-19 declaration under Section 564(b)(1) of the Act, 21 U.S.C. section 360bbb-3(b)(1), unless the authorization is terminated or revoked.  Performed at [facility], [address] [place], [place] [address]   Culture, blood (x 2)     Status: Abnormal   Collection Time: 05/28/22 10:38 AM   Specimen: BLOOD RIGHT ARM  Result Value Ref Range Status   Specimen Description BLOOD RIGHT ARM  Final   Special Requests   Final    BOTTLES DRAWN AEROBIC AND ANAEROBIC Blood Culture adequate volume   Culture  Setup Time   Final    GRAM POSITIVE COCCI IN CLUSTERS BOTTLES DRAWN AEROBIC ONLY CRITICAL RESULT CALLED TO, READ BACK BY AND VERIFIED WITH: PHARMD FRedmond Pulling [phone] '@1638'  FH    Culture (Havana Baldwin)  Final    STAPHYLOCOCCUS AUREUS SUSCEPTIBILITIES PERFORMED ON PREVIOUS CULTURE WITHIN THE LAST 5 DAYS. Performed at [facility], [place] [address], [place], [place] [address]    Report Status 05/31/2022 FINAL  Final  Culture, blood (x 2)     Status: Abnormal   Collection Time: 05/28/22 10:41 AM   Specimen: BLOOD LEFT ARM  Result Value Ref Range Status   Specimen Description BLOOD LEFT ARM  Final   Special Requests   Final    BOTTLES DRAWN AEROBIC AND ANAEROBIC Blood Culture adequate volume   Culture  Setup Time   Final    GRAM POSITIVE COCCI ANAEROBIC BOTTLE ONLY CRITICAL RESULT CALLED TO, READ BACK BY AND VERIFIED WITH: PHARMD C DAVIS [phone] AT 1406 BY CM Performed at [facility], [place] [address],  [place], [place] [address]    Culture STAPHYLOCOCCUS AUREUS (Senia Even)  Final   Report Status 05/31/2022 FINAL  Final   Organism ID, Bacteria STAPHYLOCOCCUS AUREUS  Final      Susceptibility   Staphylococcus aureus - MIC*    CIPROFLOXACIN <=0.5 SENSITIVE Sensitive     ERYTHROMYCIN 0.5 SENSITIVE Sensitive     GENTAMICIN <=0.5 SENSITIVE Sensitive     OXACILLIN 0.5 SENSITIVE Sensitive     TETRACYCLINE <=1 SENSITIVE Sensitive     VANCOMYCIN 1 SENSITIVE Sensitive     TRIMETH/SULFA <=10 SENSITIVE Sensitive     CLINDAMYCIN <=0.25 SENSITIVE Sensitive     RIFAMPIN <=0.5 SENSITIVE Sensitive     Inducible Clindamycin NEGATIVE Sensitive     * STAPHYLOCOCCUS AUREUS  Blood Culture ID Panel (Reflexed)     Status: Abnormal   Collection Time: 05/28/22 10:41 AM  Result Value Ref Range Status   Enterococcus faecalis NOT DETECTED NOT DETECTED Final   Enterococcus Faecium NOT DETECTED NOT DETECTED Final   Listeria monocytogenes NOT DETECTED NOT DETECTED Final   Staphylococcus species DETECTED (Manan Olmo) NOT DETECTED Final    Comment: CRITICAL RESULT CALLED TO, READ BACK BY AND VERIFIED WITH: PHARMD C DAVID [id number] AT 1406 BY CM    Staphylococcus aureus (BCID) DETECTED (Serenity Fortner) NOT DETECTED Final    Comment: CRITICAL RESULT CALLED TO, READ BACK BY AND VERIFIED WITH: PHARMD C DAVIS [id number] AT 1406 BY CM    Staphylococcus epidermidis NOT DETECTED NOT DETECTED Final   Staphylococcus lugdunensis NOT DETECTED NOT DETECTED Final   Streptococcus species NOT DETECTED NOT DETECTED Final   Streptococcus agalactiae NOT DETECTED NOT DETECTED Final   Streptococcus pneumoniae NOT DETECTED NOT DETECTED Final   Streptococcus pyogenes NOT DETECTED NOT DETECTED Final   Honore Wipperfurth.calcoaceticus-baumannii NOT DETECTED NOT DETECTED Final   Bacteroides fragilis NOT DETECTED NOT DETECTED Final   Enterobacterales NOT DETECTED NOT DETECTED Final   Enterobacter cloacae complex NOT DETECTED NOT DETECTED Final   Escherichia coli NOT DETECTED NOT DETECTED  Final   Klebsiella aerogenes NOT DETECTED NOT DETECTED Final   Klebsiella oxytoca NOT DETECTED NOT DETECTED Final   Klebsiella pneumoniae NOT DETECTED NOT DETECTED Final   Proteus species NOT DETECTED NOT DETECTED Final   Salmonella species NOT DETECTED NOT DETECTED Final   Serratia marcescens NOT DETECTED NOT DETECTED Final   Haemophilus influenzae NOT DETECTED NOT DETECTED Final   Neisseria meningitidis NOT DETECTED NOT DETECTED Final   Pseudomonas  aeruginosa NOT DETECTED NOT DETECTED Final   Stenotrophomonas maltophilia NOT DETECTED NOT DETECTED Final   Candida albicans NOT DETECTED NOT DETECTED Final   Candida auris NOT DETECTED NOT DETECTED Final   Candida glabrata NOT DETECTED NOT DETECTED Final   Candida krusei NOT DETECTED NOT DETECTED Final   Candida parapsilosis NOT DETECTED NOT DETECTED Final   Candida tropicalis NOT DETECTED NOT DETECTED Final   Cryptococcus neoformans/gattii NOT DETECTED NOT DETECTED Final   Meth resistant mecA/C and MREJ NOT DETECTED NOT DETECTED Final    Comment: Performed at [facility], [place] [address], [place], [place] [address]  Culture, blood (Routine X 2) w Reflex to ID Panel     Status: None   Collection Time: 05/30/22  6:21 AM   Specimen: BLOOD  Result Value Ref Range Status   Specimen Description BLOOD RIGHT ANTECUBITAL  Final   Special Requests   Final    BOTTLES DRAWN AEROBIC AND ANAEROBIC Blood Culture adequate volume   Culture   Final    NO GROWTH 5 DAYS Performed at [facility], [place] [address] [place], [place] [address]    Report Status 06/04/2022 FINAL  Final  Culture, blood (Routine X 2) w Reflex to ID Panel     Status: None   Collection Time: 05/30/22  6:26 AM   Specimen: BLOOD RIGHT HAND  Result Value Ref Range Status   Specimen Description BLOOD RIGHT HAND  Final   Special Requests   Final    BOTTLES DRAWN AEROBIC AND ANAEROBIC Blood Culture adequate volume   Culture   Final    NO GROWTH 5 DAYS Performed at  [facility], [place] [address] [place], [place] [address]    Report Status 06/04/2022 FINAL  Final         Radiology Studies: ECHO TEE  Result Date: 06/05/2022    TRANSESOPHOGEAL ECHO REPORT   Patient Name:   ZUZANNA MARONEY Date of Exam: 06/05/2022 Medical Rec #:  [id number]            Height:       67.0 in Accession #:    [id number]           Weight:       162.0 lb Date of Birth:  Feb 11, 1951             BSA:          1.849 m Patient Age:    19 years             BP:           135/63 mmHg Patient Gender: F                    HR:           58 bpm. Exam Location:  Inpatient Procedure: Cardiac Doppler, Color Doppler and Transesophageal Echo Indications:    MSSA bacteremia, rule out endocarditis  History:        Patient has prior history of Echocardiogram examinations.                 Signs/Symptoms:Bacteremia. MSSA bacteremia.  Sonographer:    Eartha Inch Referring Phys: [id number] Little Ishikawa PROCEDURE: The transesophogeal probe was passed without difficulty through the esophogus of the patient. Imaged were obtained with the patient in Donne Baley supine position. Sedation performed by different physician. The patient developed no complications during the procedure. IMPRESSIONS  1. Left ventricular ejection fraction,  by estimation, is 60 to 65%. The left ventricle has normal function.  2. Right ventricular systolic function is normal. The right ventricular size is normal.  3. Left atrial size was severely dilated. No left atrial/left atrial appendage thrombus was detected.  4. The mitral valve is grossly normal. Mild mitral valve regurgitation.  5. The aortic valve is tricuspid. Aortic valve regurgitation is not visualized.  6. Agitated saline contrast bubble study was negative, with no evidence of any interatrial shunt. Conclusion(s)/Recommendation(s): No evidence of vegetation/infective endocarditis on this transesophageael echocardiogram. FINDINGS  Left Ventricle: Left ventricular ejection fraction,  by estimation, is 60 to 65%. The left ventricle has normal function. The left ventricular internal cavity size was normal in size. There is no left ventricular hypertrophy. Right Ventricle: The right ventricular size is normal. No increase in right ventricular wall thickness. Right ventricular systolic function is normal. Left Atrium: Left atrial size was severely dilated. No left atrial/left atrial appendage thrombus was detected. Right Atrium: Right atrial size was normal in size. Pericardium: There is no evidence of pericardial effusion. Mitral Valve: The mitral valve is grossly normal. Mild mitral valve regurgitation. Tricuspid Valve: The tricuspid valve is grossly normal. Tricuspid valve regurgitation is trivial. Aortic Valve: The aortic valve is tricuspid. Aortic valve regurgitation is not visualized. Pulmonic Valve: The pulmonic valve was grossly normal. Pulmonic valve regurgitation is trivial. Aorta: The aortic root and ascending aorta are structurally normal, with no evidence of dilitation. IAS/Shunts: No atrial level shunt detected by color flow Doppler. Agitated saline contrast was given intravenously to evaluate for intracardiac shunting. Agitated saline contrast bubble study was negative, with no evidence of any interatrial shunt. Lyman Bishop MD Electronically signed by Lyman Bishop MD Signature Date/Time: 06/05/2022/2:43:55 PM    Final    DG CHEST PORT 1 VIEW  Result Date: 06/05/2022 CLINICAL DATA:  71 year old female with shortness of breath. EXAM: PORTABLE CHEST - 1 VIEW COMPARISON:  05/27/2022 FINDINGS: The mediastinal contours are within normal limits. No cardiomegaly. The lungs are clear bilaterally without evidence of focal consolidation, pleural effusion, or pneumothorax. No acute osseous abnormality. IMPRESSION: No acute cardiopulmonary process. Electronically Signed   By: Ruthann Cancer M.D.   On: 06/05/2022 10:33    Scheduled Meds:  amLODipine  5 mg Oral BID   carvedilol  12.5 mg Oral  BID WC   enoxaparin (LOVENOX) injection  40 mg Subcutaneous Q24H   irbesartan  300 mg Oral Daily   lidocaine  15 mL Oral Once   sodium chloride flush  3 mL Intravenous Q12H   trihexyphenidyl  2 mg Oral TID WC   Continuous Infusions:   ceFAZolin (ANCEF) IV 2 g (06/05/22 1504)     LOS: 8 days   Time spent: 54mn  Caldwell Powell, DO [facility]  If 7PM-7AM, please contact night-coverage [email or web address]  06/05/2022, 3:19 PM

## 2022-06-05 NOTE — CV Procedure (Signed)
TRANSESOPHAGEAL ECHOCARDIOGRAM (TEE) NOTE  INDICATIONS: infective endocarditis  PROCEDURE:   Informed consent was obtained prior to the procedure. The risks, benefits and alternatives for the procedure were discussed and the patient comprehended these risks.  Risks include, but are not limited to, cough, sore throat, vomiting, nausea, somnolence, esophageal and stomach trauma or perforation, bleeding, low blood pressure, aspiration, pneumonia, infection, trauma to the teeth and death.    After a procedural time-out, the patient was given propofol for sedation by anesthesia. See their separate report.  The patient's heart rate, blood pressure, and oxygen saturation are monitored continuously during the procedure.The oropharynx was anesthetized with topical cetacaine.  The transesophageal probe was inserted in the esophagus and stomach without difficulty and multiple views were obtained.  The patient was kept under observation until the patient left the procedure room.  I was present face-to-face 100% of this time. The patient left the procedure room in stable condition.   Agitated microbubble saline contrast was administered.  COMPLICATIONS:    There were no immediate complications.  Findings:  LEFT VENTRICLE: The left ventricular wall thickness is normal.  The left ventricular cavity is normal in size. Wall motion is normal.  LVEF is 60-65%.  RIGHT VENTRICLE:  The right ventricle is normal in structure and function without any thrombus or masses.    LEFT ATRIUM:  The left atrium is severely dilated in size without any thrombus or masses.  There is not spontaneous echo contrast ("smoke") in the left atrium consistent with a low flow state.  LEFT ATRIAL APPENDAGE:  The left atrial appendage is free of any thrombus or masses. The appendage has single lobes. Pulse doppler indicates high flow in the appendage.  ATRIAL SEPTUM:  The atrial septum appears intact and is free of thrombus  and/or masses.  There is no evidence for interatrial shunting by color doppler and saline microbubble.  RIGHT ATRIUM:  The right atrium is normal in size and function without any thrombus or masses.  MITRAL VALVE:  The mitral valve is normal in structure and function with Mild regurgitation.  There were no vegetations or stenosis.  AORTIC VALVE:  The aortic valve is trileaflet, normal in structure and function with  no  regurgitation.  There were no vegetations or stenosis  TRICUSPID VALVE:  The tricuspid valve is normal in structure and function with  trivial  regurgitation.  There were no vegetations or stenosis   PULMONIC VALVE:  The pulmonic valve is normal in structure and function with  trivial  regurgitation.  There were no vegetations or stenosis.   AORTIC ARCH, ASCENDING AND DESCENDING AORTA:  There was grade 1 Ron Parker et. Al, 1992) atherosclerosis of the aortic arch and proximal descending aorta.  12. PULMONARY VEINS: Anomalous pulmonary venous return was not noted.  13. PERICARDIUM: The pericardium appeared normal and non-thickened.  There is no pericardial effusion.  IMPRESSION:   No evidence of endocarditis No LAA thrombus Negative for PFO Severe LAE Mild MR Trivial TR, PI LVEF 60-65%, normal wall motion  RECOMMENDATIONS:     Antibiotics for bacteremia per ID recommendations.  Time Spent Directly with the Patient:  45 minutes   Pixie Casino, MD, [facility], [place] Director of the [facility] Lipid [facility]  Card[facility] Diplomate of the American Board of Clinical Lipidology Attending Cardiologist  Direct Dial: [phone]  Fax: [phone]  Website:  [email or web address] 06/05/2022, 11:47 AM

## 2022-06-05 NOTE — Transfer of Care (Signed)
Immediate Anesthesia Transfer of Care Note  Patient: Kristin Gilmore  Procedure(s) Performed: TRANSESOPHAGEAL ECHOCARDIOGRAM (TEE)  Patient Location: PACU and Endoscopy Unit  Anesthesia Type:MAC  Level of Consciousness: awake, alert  and oriented  Airway & Oxygen Therapy: Patient Spontanous Breathing  Post-op Assessment: Report given to RN  Post vital signs: Reviewed and stable  Last Vitals:  Vitals Value Taken Time  BP 128/61 06/05/22 1154  Temp    Pulse 66 06/05/22 1154  Resp 16 06/05/22 1154  SpO2 90 % 06/05/22 1154  Vitals shown include unvalidated device data.  Last Pain:  Vitals:   06/05/22 1154  TempSrc:   PainSc: 8       Patients Stated Pain Goal: 0 (90/24/09 7353)  Complications: No notable events documented.

## 2022-06-05 NOTE — Plan of Care (Signed)
  Problem: Education: Goal: Knowledge of General Education information will improve Description Including pain rating scale, medication(s)/side effects and non-pharmacologic comfort measures Outcome: Progressing   Problem: Health Behavior/Discharge Planning: Goal: Ability to manage health-related needs will improve Outcome: Progressing   

## 2022-06-05 NOTE — Progress Notes (Signed)
  Echocardiogram Echocardiogram Transesophageal has been performed.  Kristin Gilmore 06/05/2022, 11:57 AM

## 2022-06-05 NOTE — Progress Notes (Signed)
Physical Therapy Treatment Patient Details Name: Kristin Gilmore MRN: [id number] DOB: 11/26/1950 Today's Date: 06/05/2022   History of Present Illness Pt is a 71 y.o. female who presented 05/27/22 with worsening chronic lower back pain with radicular symptoms. Pt admitted with sepsis secondary to MSSA bacteremia with unclear source. PMH includes OA, depression, HTN, parkinson;s disease, pituitary microadenoma, PNA, R L3-5 TLOIF 10/30/2021    PT Comments    Pt initially declining PT 2x this morning due to pain, even with pre-medication, but upon hearing pt call out for assistance pt was sitting EOB and insisting on transferring to commode and complaining of "14" out of 10 pain in her chest. RN also arrived and got report from pt to communicate with MD. As pt was insistent on sitting on commode, PT provided min guard assist for safety. Extended period of time waiting for pt to complete a BM to assist her back to bed for EKG, but pt declining due to inability to have BM. Towards the end, pt reported her pain was almost "all better" but wanted to remain on commode. RN aware of pain and pt location. Will continue to follow acutely. Current recommendations remain appropriate.     Recommendations for follow up therapy are one component of a multi-disciplinary discharge planning process, led by the attending physician.  Recommendations may be updated based on patient status, additional functional criteria and insurance authorization.  Follow Up Recommendations  Skilled nursing-short term rehab (<3 hours/day) (pt would like to go [facility]) Can patient physically be transported by private vehicle: Yes   Assistance Recommended at Discharge Frequent or constant Supervision/Assistance  Patient can return home with the following A little help with walking and/or transfers;A little help with bathing/dressing/bathroom;Assistance with cooking/housework;Assist for transportation;Help with stairs or ramp for  entrance   Equipment Recommendations  None recommended by PT    Recommendations for Other Services       Precautions / Restrictions Precautions Precautions: Fall Restrictions Weight Bearing Restrictions: No     Mobility  Bed Mobility Overal bed mobility: Needs Assistance             General bed mobility comments: Pt sitting EOB upon arrival with RN present, complaining of chest pain.    Transfers Overall transfer level: Needs assistance Equipment used:  (holding onto commode/bed) Transfers: Sit to/from Stand, Bed to chair/wheelchair/BSC Sit to Stand: Min guard   Step pivot transfers: Min guard       General transfer comment: Min guard assist for safety with transfers to stand from EOB and commode multiple times. Pt found coming to stand from commode multiple times without PT present as pt declined for PT to remain in room while pt tried to have BM. Extended period of time monitoring pt for completion of BM to assist her back to bed. Min guard for slow, small steps bed > commode.    Ambulation/Gait Ambulation/Gait assistance: Min guard Gait Distance (Feet): 2 Feet Assistive device:  (holding onto commode/bed) Gait Pattern/deviations: Step-through pattern, Decreased step length - right, Decreased step length - left, Decreased stride length, Trunk flexed, Shuffle Gait velocity: reduced Gait velocity interpretation: <1.31 ft/sec, indicative of household ambulator   General Gait Details: Pt with very slow, shuffling steps bed > commode holding onto commode and bed for stability. Min guard for safety.   Stairs             Wheelchair Mobility    Modified Rankin (Stroke Patients Only)       Balance Overall  balance assessment: Needs assistance Sitting-balance support: No upper extremity supported, Feet supported Sitting balance-Leahy Scale: Fair Sitting balance - Comments: Static sitting EOB with supervision   Standing balance support: Bilateral upper  extremity supported, During functional activity, Reliant on assistive device for balance Standing balance-Leahy Scale: Poor Standing balance comment: Reliant on UE support                            Cognition Arousal/Alertness: Awake/alert Behavior During Therapy: Anxious Overall Cognitive Status: Within Functional Limits for tasks assessed                                 General Comments: Pt has poor insight into her safety, insisting on going to sit on commode even with reportedly excrutiating chest pain and RN encouraging pt to be in bed for EKG. RN aware of situation and present during her resistance to lay down. PT assisting in monitoring pt for her safety. Pt anxious in regards to worrying she may be having a heart attack but then reports may be due to heart burn or constipation, still resisting to get in bed until she has a BM.        Exercises      General Comments General comments (skin integrity, edema, etc.): pt repeatedly declining to get to bed for EKG as pt complaining of chest pain that eventually improved while sitting on commode. RN aware and reporting PT can leave and RN will step in to assist pt back to bed when she is done with BM.      Pertinent Vitals/Pain Pain Assessment Pain Assessment: Faces Pain Score:  ("14") Pain Location: chest, under L breast, abdomen, back Pain Descriptors / Indicators: Discomfort, Grimacing, Guarding, Moaning Pain Intervention(s): Limited activity within patient's tolerance, Monitored during session, Repositioned, Premedicated before session, Patient requesting pain meds-RN notified (RN aware)    Home Living                          Prior Function            PT Goals (current goals can now be found in the care plan section) Acute Rehab PT Goals Patient Stated Goal: to reduce her pain PT Goal Formulation: With patient Time For Goal Achievement: 06/16/22 Potential to Achieve Goals:  Good Progress towards PT goals: Progressing toward goals    Frequency    Min 2X/week      PT Plan Current plan remains appropriate    Co-evaluation              AM-PAC PT "6 Clicks" Mobility   Outcome Measure  Help needed turning from your back to your side while in a flat bed without using bedrails?: A Little Help needed moving from lying on your back to sitting on the side of a flat bed without using bedrails?: A Little Help needed moving to and from a bed to a chair (including a wheelchair)?: A Little Help needed standing up from a chair using your arms (e.g., wheelchair or bedside chair)?: A Little Help needed to walk in hospital room?: Total Help needed climbing 3-5 steps with a railing? : Total 6 Click Score: 14    End of Session   Activity Tolerance: Patient limited by pain Patient left: with call bell/phone within reach;Other (comment) (on commode) Nurse Communication: Mobility status;Other (comment) (pain)  PT Visit Diagnosis: Unsteadiness on feet (R26.81);Other abnormalities of gait and mobility (R26.89);Muscle weakness (generalized) (M62.81);Difficulty in walking, not elsewhere classified (R26.2);Pain Pain - Right/Left: Right Pain - part of body: Knee (back)     Time: 6773-7366 PT Time Calculation (min) (ACUTE ONLY): 44 min  Charges:  $Therapeutic Activity: 38-52 mins                     Moishe Spice, PT, DPT Acute Rehabilitation Services  Office: [phone]    Orvan Falconer 06/05/2022, 9:42 AM

## 2022-06-05 NOTE — Anesthesia Postprocedure Evaluation (Signed)
Anesthesia Post Note  Patient: Kristin Gilmore  Procedure(s) Performed: TRANSESOPHAGEAL ECHOCARDIOGRAM (TEE)     Patient location during evaluation: Endoscopy Anesthesia Type: MAC Level of consciousness: awake Pain management: pain level controlled Vital Signs Assessment: post-procedure vital signs reviewed and stable Respiratory status: spontaneous breathing Cardiovascular status: stable Postop Assessment: no apparent nausea or vomiting Anesthetic complications: no   No notable events documented.  Last Vitals:  Vitals:   06/05/22 1022 06/05/22 1154  BP: 135/63 128/61  Pulse: 66 67  Resp: 14 16  Temp:    SpO2: 98% 96%    Last Pain:  Vitals:   06/05/22 1154  TempSrc:   PainSc: 8                  Kiptyn Rafuse

## 2022-06-05 NOTE — Interval H&P Note (Signed)
History and Physical Interval Note:  06/05/2022 11:09 AM  Kristin Gilmore  has presented today for surgery, with the diagnosis of bacteremia.  The various methods of treatment have been discussed with the patient and family. After consideration of risks, benefits and other options for treatment, the patient has consented to  Procedure(s): TRANSESOPHAGEAL ECHOCARDIOGRAM (TEE) (N/A) as a surgical intervention for MSSA bacteremia.  The patient's history has been reviewed, patient examined, no change in status, stable for surgery.  I have reviewed the patient's chart and labs.  Questions were answered to the patient's satisfaction.     Pixie Casino

## 2022-06-06 DIAGNOSIS — M7021 Olecranon bursitis, right elbow: Secondary | ICD-10-CM | POA: Diagnosis not present

## 2022-06-06 DIAGNOSIS — G20 Parkinson's disease: Secondary | ICD-10-CM | POA: Diagnosis not present

## 2022-06-06 DIAGNOSIS — R7881 Bacteremia: Secondary | ICD-10-CM | POA: Diagnosis not present

## 2022-06-06 DIAGNOSIS — B9561 Methicillin susceptible Staphylococcus aureus infection as the cause of diseases classified elsewhere: Secondary | ICD-10-CM | POA: Diagnosis not present

## 2022-06-06 DIAGNOSIS — M549 Dorsalgia, unspecified: Secondary | ICD-10-CM | POA: Diagnosis not present

## 2022-06-06 MED ORDER — ENSURE ENLIVE PO LIQD
237.0000 mL | Freq: Two times a day (BID) | ORAL | Status: DC
  Administered 2022-06-08: 237 mL via ORAL

## 2022-06-06 MED ORDER — BISACODYL 10 MG RE SUPP
10.0000 mg | Freq: Once | RECTAL | Status: AC
  Administered 2022-06-06: 10 mg via RECTAL
  Filled 2022-06-06: qty 1

## 2022-06-06 NOTE — Anesthesia Postprocedure Evaluation (Addendum)
Anesthesia Post Note  Patient: Kristin Gilmore  Procedure(s) Performed: MRI WITH ANESTHESIA LUMBAR SPINE W/WO CONSTRAST     Patient location during evaluation: PACU Anesthesia Type: General Level of consciousness: awake and patient cooperative Pain management: pain level controlled Vital Signs Assessment: post-procedure vital signs reviewed and stable Respiratory status: spontaneous breathing, nonlabored ventilation and respiratory function stable Cardiovascular status: blood pressure returned to baseline and stable Postop Assessment: no apparent nausea or vomiting Anesthetic complications: no   No notable events documented.               Eris Breck

## 2022-06-06 NOTE — Progress Notes (Addendum)
She is awake, alert and oriented. Moving better one week post admit for right elbow septic bursitis with bacteremia and sepsis. She has painful lumbar degenerative disc disease immediately above lumbar fusion and mechanical disc pain but in the face of an infection needs infection Treated. Exercise may be helpful but since the DDD is immediately adjacent to her fusion I believe she may eventually need to have the fusion extend to incorporate this segment. Will her Parkinson's disease she is constantly in motion and this likely worsens her pain and makes rest difficult. She had a left ischial bursitis that may improve with therapy or a further left ischial bursa cortisone injection once her infection of the elbow has completed treatment.  There is no surgical treatment planned, she has improved with conservative  Management and antibiotics. She is stable from an orthopaedic standpoint and ID will help in determing the best oral medication for treatment of the  Olecranon bursitis.  I will see her in my office in 2 weeks.

## 2022-06-06 NOTE — Plan of Care (Signed)
  Problem: Education: Goal: Knowledge of General Education information will improve Description Including pain rating scale, medication(s)/side effects and non-pharmacologic comfort measures Outcome: Progressing   Problem: Health Behavior/Discharge Planning: Goal: Ability to manage health-related needs will improve Outcome: Progressing   

## 2022-06-06 NOTE — TOC Progression Note (Signed)
Transition of Care [facility]) - Progression Note    Patient Details  Name: Kristin Gilmore MRN: [id number] Date of Birth: October 22, 1950  [facility] CM/SW Contact  Reece Agar, [place] Phone Number: 06/06/2022, 1:31 PM  Clinical Narrative:    [facility] contacted CSW for bed offer if the pt on Saturday if the pt is willing tot pay the non insurance covered daily rate of $41.  CSW hared this information with the pt, pt is agreeable to pay the daily fee of 41 dollars. CSW will continue to follow DC planning.  Expected Discharge Plan: [place] Barriers to Discharge: Continued Medical Work up  Expected Discharge Plan and Services Expected Discharge Plan: [place] In-house Referral: Clinical Social Work   Post Acute Care Choice: [place] Living arrangements for the past 2 months: Single Family Home                                       Social Determinants of Health (SDOH) Interventions    Readmission Risk Interventions     No data to display

## 2022-06-06 NOTE — Progress Notes (Signed)
     Subjective: 1 Day Post-Op Procedure(s) (LRB): TRANSESOPHAGEAL ECHOCARDIOGRAM (TEE) (N/A)  Patient reports pain as moderate.    Objective:   VITALS:  Temp:  [97.8 F (36.6 C)-98.7 F (37.1 C)] 98.7 F (37.1 C) (09/07 0929) Pulse Rate:  [56-74] 59 (09/07 0929) Resp:  [16-20] 18 (09/07 0929) BP: (156-167)/(65-77) 156/65 (09/07 0929) SpO2:  [94 %-100 %] 95 % (09/07 0929)  Neurologically intact ABD soft Neurovascular intact Sensation intact distally Intact pulses distally Right olecranon bursitis is less swollen, still mild erythrema, desquamation due to decrease swelling and likely related to  Staph infection.  Right Triceps intramuscular lipoma, benign LABS Recent Labs    06/04/22 0609  HGB 10.9*  WBC 8.1  PLT 325   Recent Labs    06/04/22 0609  NA 133*  K 4.4  CL 96*  CO2 28  BUN 40*  CREATININE 1.22*  GLUCOSE 160*   No results for input(s): "LABPT", "INR" in the last 72 hours.   Assessment/Plan: 1 Day Post-Op Procedure(s) (LRB): TRANSESOPHAGEAL ECHOCARDIOGRAM (TEE) (N/A) Lumbar Degenerative disc disease Left ischial bursitis, unable to inject with steroid due to right olecranon bursitis. Therapy will likely help this. Right distal Triceps intramuscular lipoma, likely benign should be followed for any signs of malignant characteristics, liposarcoma is less likely but if there is enlargement over 4 cm then resection with intraop biopsy would be considered.  Her spine condition is adjacent level degenerative disc disease, presently with infected bursitis not a surgical candidate. She will be seen in follow up of her elbow by me then referred to spine surgeon, Dr. Laurance Flatten when he Is available in 2-3 weeks.   Advance diet Up with therapy Continue ABX therapy due to right olecranon bursa infection Discharge to SNF  [name] 06/06/2022, 1:57 PM Patient ID: Kristin Gilmore, female   DOB: 1951/09/27, 71 y.o.   MRN: [id number]

## 2022-06-06 NOTE — Progress Notes (Signed)
Occupational Therapy Treatment Patient Details Name: Kristin Gilmore MRN: [id number] DOB: 1951-06-25 Today's Date: 06/06/2022   History of present illness Pt is a 71 y.o. female who presented 05/27/22 with worsening chronic lower back pain with radicular symptoms. Pt admitted with sepsis secondary to MSSA bacteremia with unclear source. PMH includes OA, depression, HTN, parkinson;s disease, pituitary microadenoma, PNA, R L3-5 TLOIF 10/30/2021   OT comments  Pt making excellent progress towards OT goals, 4/4 met and upgraded. Pt able to mobilize to/from bathroom using RW with Supervision. Pt able to complete toileting tasks and demonstrate skills for LB dressing tasks with Supervision as well. Pt remains limited by reported high back pain levels, requiring frequent repositioning during session. Pt reports unsure if back sx will be completed during this admission (Dr. Louanne Skye at bedside at end of session to discuss with pt). If surgical interventions planned during admission, SNF rehab may still be a viable option. However, if no surgery planned, pt appears to be functioning well enough to return home with HHOT despite pain.    Recommendations for follow up therapy are one component of a multi-disciplinary discharge planning process, led by the attending physician.  Recommendations may be updated based on patient status, additional functional criteria and insurance authorization.    Follow Up Recommendations  Skilled nursing-short term rehab (<3 hours/day) (HHOT if surgical intervention not being completed during this admission)    Assistance Recommended at Discharge Frequent or constant Supervision/Assistance  Patient can return home with the following  A little help with bathing/dressing/bathroom;Assistance with cooking/housework;Assist for transportation   Equipment Recommendations  None recommended by OT    Recommendations for Other Services      Precautions / Restrictions  Precautions Precautions: Fall Restrictions Weight Bearing Restrictions: No       Mobility Bed Mobility               General bed mobility comments: received in bathroom    Transfers Overall transfer level: Needs assistance Equipment used: Rolling walker (2 wheels) Transfers: Sit to/from Stand Sit to Stand: Supervision                 Balance Overall balance assessment: Needs assistance Sitting-balance support: No upper extremity supported, Feet supported Sitting balance-Leahy Scale: Fair     Standing balance support: Bilateral upper extremity supported, During functional activity, Reliant on assistive device for balance Standing balance-Leahy Scale: Fair                             ADL either performed or assessed with clinical judgement   ADL Overall ADL's : Needs assistance/impaired                     Lower Body Dressing: Supervision/safety;Sitting/lateral leans;Sit to/from stand Lower Body Dressing Details (indicate cue type and reason): able to cross LEs easily to reach socks. discussed strategies to minimize pain though pt denies any issues bending to feet at this time Toilet Transfer: Supervision/safety;Ambulation;Rolling walker (2 wheels);Comfort height toilet Toilet Transfer Details (indicate cue type and reason): no assist to stand from toilet Toileting- Clothing Manipulation and Hygiene: Supervision/safety;Sitting/lateral lean;Sit to/from stand Toileting - Clothing Manipulation Details (indicate cue type and reason): no assist for hygiene     Functional mobility during ADLs: Supervision/safety;Rolling walker (2 wheels) General ADL Comments: pt reports need to frequently reposition d/t back pain though mobilizing without assist. discussed what pt would need to do to go home safely  as pt appearing to approach HH level.    Extremity/Trunk Assessment Upper Extremity Assessment Upper Extremity Assessment: RUE deficits/detail RUE  Deficits / Details: R elbow reddenned though ROM WFL   Lower Extremity Assessment Lower Extremity Assessment: Defer to PT evaluation        Vision   Vision Assessment?: No apparent visual deficits   Perception     Praxis      Cognition Arousal/Alertness: Awake/alert Behavior During Therapy: WFL for tasks assessed/performed Overall Cognitive Status: No family/caregiver present to determine baseline cognitive functioning                                 General Comments: participatory, easily distracted and some decreased insight on home vs if rehab needed        Exercises      Shoulder Instructions       General Comments Dr. Louanne Skye in to talk with pt. Prior to MD arrival, inquired if back sx was going to be completed during this admission and if not, feel pt could DC with [facility] services.    Pertinent Vitals/ Pain       Pain Assessment Pain Assessment: Faces Faces Pain Scale: Hurts even more Pain Location: back Pain Descriptors / Indicators: Discomfort, Grimacing, Guarding, Moaning Pain Intervention(s): Monitored during session, Repositioned, Other (comment) (pt frequently changing position)  Home Living                                          Prior Functioning/Environment              Frequency  Min 2X/week        Progress Toward Goals  OT Goals(current goals can now be found in the care plan section)  Progress towards OT goals: Progressing toward goals  Acute Rehab OT Goals Patient Stated Goal: pain control, go to [facility] OT Goal Formulation: With patient Time For Goal Achievement: 06/17/22 Potential to Achieve Goals: Good ADL Goals Pt Will Perform Upper Body Dressing: with supervision;sitting Pt Will Perform Lower Body Dressing: with min assist;sitting/lateral leans;sit to/from stand Pt Will Transfer to Toilet: with supervision;ambulating;stand pivot transfer;bedside commode Additional ADL Goal #1: pt will perform  bed mobility with supervision in prep for ADLs  Plan Discharge plan remains appropriate    Co-evaluation                 AM-PAC OT "6 Clicks" Daily Activity     Outcome Measure   Help from another person eating meals?: None Help from another person taking care of personal grooming?: A Little Help from another person toileting, which includes using toliet, bedpan, or urinal?: A Little Help from another person bathing (including washing, rinsing, drying)?: A Little Help from another person to put on and taking off regular upper body clothing?: A Little Help from another person to put on and taking off regular lower body clothing?: A Little 6 Click Score: 19    End of Session Equipment Utilized During Treatment: Rolling walker (2 wheels)  OT Visit Diagnosis: Unsteadiness on feet (R26.81);Other abnormalities of gait and mobility (R26.89);Muscle weakness (generalized) (M62.81)   Activity Tolerance Patient tolerated treatment well;Patient limited by pain   Patient Left in chair;Other (comment) (Dr. Louanne Skye at bedside)   Nurse Communication Mobility status        Time: 1212-1229 OT  Time Calculation (min): 17 min  Charges: OT General Charges $OT Visit: 1 Visit OT Treatments $Self Care/Home Management : 8-22 mins  Malachy Chamber, OTR/L Acute Rehab Services Office: [phone]   Layla Maw 06/06/2022, 1:08 PM

## 2022-06-06 NOTE — Progress Notes (Signed)
PROGRESS NOTE    Kristin Gilmore  DBZ:[id number] DOB: [birth year] DOA: 05/27/2022 PCP: [facility] @ [place]   Brief Narrative:  Kristin Gilmore is Kristin Gilmore 71 y.o. female with medical history significant of hypertension, dyslipidemia, prediabetes, anxiety, depression who presents with complaints of worsening back pain, denies trauma. Patient reports having severe pain in her lower back with radiation down the left leg. Sees Dr. Louanne Skye and has issues with chronic pain in this area.  They were trying to arrange for her to have an outpatient nerve block, but have been waiting on insurance approval.  She reports that she last had surgery of her lumbar spine back in January of this year.  She denies having any significant fevers, chills, saddle anesthesia, or  incontinence.  06/03/2022: The patient was seen and examined at bedside.  Reports pain in her right knee and lower back.  X-ray done on 04/09/2022 showed fracture to the lateral portion of the patella.  Component looks stable.  Fracture appears to be chronic.  Had an MRI lumbar spine with and without contrast on 05/30/2022.  Assessment & Plan:   Principal Problem:   Bacteremia due to methicillin susceptible Staphylococcus aureus (MSSA) Active Problems:   Intractable back pain   SIRS (systemic inflammatory response syndrome) (HCC)   Lumbar spinal stenosis   Parkinson's disease (HCC)   Bipolar disorder, in partial remission, most recent episode hypomanic ([place])   Hyperkalemia   Dehydration   Essential hypertension   Lumbar radicular pain   AKI (acute kidney injury) ([place])   Bursitis of right elbow  Chest pain Unclear cause, now resolved.  She notes she has this intermittently, is not interested in working this up further. She refused EKG ordered to w/u.  CXR without acute cardiopulm abnormality. TEE today without noted wall motion abnormality  Sepsis secondary to MSSA bacteremia, POA Unclear source Blood cultures  positive for MSSA, pansensitive.   ID following  Continue cefazolin as recommended by ID.   -Multiple imaging looking for source of infection thus far been unrevealing. -ESR 41, CRP 32, lactic acid normal, INR normal -TTE performed previously although patient became somewhat combative and the test was not completed, cardiology able to read what imaging was taken without any overt findings of valvular abnormalities. -CT of left elbow 06/02/2022 showed olecranon bursitis with bursal distension measuring 3.1 x 0.9 x 3.2 cm. Mild adjacent soft tissue swelling.  Also has significant RLE knee pain (has hx right knee replacement) -> though this has resolved, unclear significance Appreciate ortho recs TEE without evidence of vegetation/infective endocarditis   Essential hypertension BP stable Continue home oral antihypertensives Continue to monitor vital signs.   Chronic anxiety and depression -Currently on Xanax only, med rec pending for patient's chronic medications -Current unconfirmed medications listed include amitriptyline, Seroquel, Effexor   Prediabetes Last hemoglobin A1c 5.8 on 06/22/2021.   Home medication regimen appears to include glipizide. Hold off oral hypoglycemics Diet-controlled   Parkinson's disease Continue Home medication regimen includes Artane 2 mg 3 times daily.  Lower back pain; acute on chronic, chronic history of lumbar stenosis with radiculopathy, POA - Acute on chronic pain without deficits or paresthesias. - Initial plan for neurology evaluation and potential nerve block however this appears to have been done previously this month with minimal to no improvement, as such will not be repeated given limited therapeutics but increased risk of complications. -MRI without overt findings to explain worsening back pain - discussed with ortho -Continue home pain meds  including hydrocodone and p.o. Dilaudid - appears to be moderately well controlled at this time, not  pain-free but again near her baseline. -Dr. Louanne Skye orthopedics following, appreciate insight and recommendations - likely require outpatient follow up given no indication for procedure/intervention at this time  DVT prophylaxis: Subcu Lovenox daily Code Status: Full Family Communication: None present  Status is: Inpatient  Dispo: The patient is from: Home              Anticipated d/c is to: Home              Anticipated d/c date is: 06/06/22 after TEE              Patient currently not medically stable for discharge  Consultants:  Orthopedic surgery, cardiology, infectious disease  Procedures:  None  Antimicrobials:  cefazolin  Subjective: No complaints today  Objective: Vitals:   06/05/22 1022 06/05/22 1154 06/05/22 1204 06/05/22 1225  BP: 135/63 128/61 100/76 (!) 140/69  Pulse: 66 67 60 62  Resp: _0 Temp:  98 F (36.7 C)  98 F (36.7 C)  TempSrc:  Temporal  Oral  SpO2: 98% 96% 95% 92%  Weight:      Height:        Intake/Output Summary (Last 24 hours) at 06/05/2022 1519 Last data filed at 06/05/2022 1154 Gross per 24 hour  Intake 200 ml  Output 200 ml  Net 0 ml   Filed Weights   05/27/22 0757 05/30/22 0806  Weight: 72.6 kg 73.5 kg    Examination:  General: No acute distress. Cardiovascular: RRR Lungs: unlabored Abdomen: Soft, nontender, nondistended  Neurological: Alert and oriented 3. Moves all extremities 4 with equal strength. Cranial nerves II through XII grossly intact. Extremities: no TTP to R knee   Data Reviewed: I have personally reviewed following labs and imaging studies  CBC: Recent Labs  Lab 05/30/22 0621 05/31/22 0737 06/01/22 0626 06/02/22 0649 06/04/22 0609  WBC 12.1* 9.3 13.3* 11.8* 8.1  HGB 10.9* 11.4* 12.1 12.2 10.9*  HCT 31.9* 33.1* 37.0 36.5 32.7*  MCV 92.7 91.4 92.7 91.9 92.9  PLT 213 242 339 312 505   Basic Metabolic Panel: Recent Labs  Lab 05/30/22 0621 05/31/22 0737 06/01/22 0626 06/02/22 0649  06/04/22 0609  NA 135 136 138 134* 133*  K 4.3 4.0 4.4 4.2 4.4  CL 101 101 101 96* 96*  CO2 _1 GLUCOSE 165* 188* 132* 165* 160*  BUN 17 26* 25* 26* 40*  CREATININE 1.00 0.92 0.89 1.01* 1.22*  CALCIUM 8.7* 9.0 9.4 8.9 8.4*   GFR: Estimated Creatinine Clearance: 40.5 mL/min (Courtne Lighty) (by C-G formula based on SCr of 1.22 mg/dL (H)). Liver Function Tests: Recent Labs  Lab 05/30/22 0621 05/31/22 0737 06/01/22 0626 06/02/22 0649 06/04/22 0609  AST _2 13* 17  ALT _3 ALKPHOS 73 77 84 78 93  BILITOT 0.5 0.3 0.4 0.5 0.3  PROT 6.1* 6.2* 6.8 6.4* 5.7*  ALBUMIN 2.7* 2.7* 3.0* 2.8* 2.6*   No results for input(s): "LIPASE", "AMYLASE" in the last 168 hours. No results for input(s): "AMMONIA" in the last 168 hours. Coagulation Profile: No results for input(s): "INR", "PROTIME" in the last 168 hours.  Cardiac Enzymes: No results for input(s): "CKTOTAL", "CKMB", "CKMBINDEX", "TROPONINI" in the last 168 hours. BNP (last 3 results) No results for input(s): "PROBNP" in the last 8760 hours. HbA1C: No results for input(s): "HGBA1C" in the  last 72 hours. CBG: No results for input(s): "GLUCAP" in the last 168 hours. Lipid Profile: No results for input(s): "CHOL", "HDL", "LDLCALC", "TRIG", "CHOLHDL", "LDLDIRECT" in the last 72 hours. Thyroid Function Tests: No results for input(s): "TSH", "T4TOTAL", "FREET4", "T3FREE", "THYROIDAB" in the last 72 hours. Anemia Panel: No results for input(s): "VITAMINB12", "FOLATE", "FERRITIN", "TIBC", "IRON", "RETICCTPCT" in the last 72 hours. Sepsis Labs: No results for input(s): "PROCALCITON", "LATICACIDVEN" in the last 168 hours.   Recent Results (from the past 240 hour(s))  Resp Panel by RT-PCR (Flu Callaway Hardigree&B, Covid) Urine, Clean Catch     Status: None   Collection Time: 05/27/22  8:11 PM   Specimen: Urine, Clean Catch; Nasal Swab  Result Value Ref Range Status   SARS Coronavirus 2 by RT PCR NEGATIVE NEGATIVE Final    Comment:  (NOTE) SARS-CoV-2 target nucleic acids are NOT DETECTED.  The SARS-CoV-2 RNA is generally detectable in upper respiratory specimens during the acute phase of infection. The lowest concentration of SARS-CoV-2 viral copies this assay can detect is 138 copies/mL. Conception Doebler negative result does not preclude SARS-Cov-2 infection and should not be used as the sole basis for treatment or other patient management decisions. Raymon Schlarb negative result may occur with  improper specimen collection/handling, submission of specimen other than nasopharyngeal swab, presence of viral mutation(s) within the areas targeted by this assay, and inadequate number of viral copies(<138 copies/mL). Analicia Skibinski negative result must be combined with clinical observations, patient history, and epidemiological information. The expected result is Negative.  Fact Sheet for Patients:  EntrepreneurPulse.com.au  Fact Sheet for Healthcare Providers:  IncredibleEmployment.be  This test is no t yet approved or cleared by the [place] FDA and  has been authorized for detection and/or diagnosis of SARS-CoV-2 by FDA under an Emergency Use Authorization (EUA). This EUA will remain  in effect (meaning this test can be used) for the duration of the COVID-19 declaration under Section 564(b)(1) of the Act, 21 U.S.C.section 360bbb-3(b)(1), unless the authorization is terminated  or revoked sooner.       Influenza Nuno Brubacher by PCR NEGATIVE NEGATIVE Final   Influenza B by PCR NEGATIVE NEGATIVE Final    Comment: (NOTE) The Xpert Xpress SARS-CoV-2/FLU/RSV plus assay is intended as an aid in the diagnosis of influenza from Nasopharyngeal swab specimens and should not be used as Lazarius Rivkin sole basis for treatment. Nasal washings and aspirates are unacceptable for Xpert Xpress SARS-CoV-2/FLU/RSV testing.  Fact Sheet for Patients: EntrepreneurPulse.com.au  Fact Sheet for Healthcare  Providers: IncredibleEmployment.be  This test is not yet approved or cleared by the [place] FDA and has been authorized for detection and/or diagnosis of SARS-CoV-2 by FDA under an Emergency Use Authorization (EUA). This EUA will remain in effect (meaning this test can be used) for the duration of the COVID-19 declaration under Section 564(b)(1) of the Act, 21 U.S.C. section 360bbb-3(b)(1), unless the authorization is terminated or revoked.  Performed at [facility], [address], [place], [place] [address]   Culture, blood (x 2)     Status: Abnormal   Collection Time: 05/28/22 10:38 AM   Specimen: BLOOD RIGHT ARM  Result Value Ref Range Status   Specimen Description BLOOD RIGHT ARM  Final   Special Requests   Final    BOTTLES DRAWN AEROBIC AND ANAEROBIC Blood Culture adequate volume   Culture  Setup Time   Final    GRAM POSITIVE COCCI IN CLUSTERS BOTTLES DRAWN AEROBIC ONLY CRITICAL RESULT CALLED TO, READ BACK BY AND VERIFIED WITH:  PHARMD FRedmond Pulling [phone] _0  FH    Culture (Gavinn Collard)  Final    STAPHYLOCOCCUS AUREUS SUSCEPTIBILITIES PERFORMED ON PREVIOUS CULTURE WITHIN THE LAST 5 DAYS. Performed at [facility], [place] [address], [place], [place] [address]    Report Status 05/31/2022 FINAL  Final  Culture, blood (x 2)     Status: Abnormal   Collection Time: 05/28/22 10:41 AM   Specimen: BLOOD LEFT ARM  Result Value Ref Range Status   Specimen Description BLOOD LEFT ARM  Final   Special Requests   Final    BOTTLES DRAWN AEROBIC AND ANAEROBIC Blood Culture adequate volume   Culture  Setup Time   Final    GRAM POSITIVE COCCI ANAEROBIC BOTTLE ONLY CRITICAL RESULT CALLED TO, READ BACK BY AND VERIFIED WITH: PHARMD C DAVIS [id number] AT 1406 BY CM Performed at [facility], [place] [address], [place],  [address]    Culture STAPHYLOCOCCUS AUREUS (Tanush Drees)  Final   Report Status 05/31/2022 FINAL  Final   Organism ID, Bacteria  STAPHYLOCOCCUS AUREUS  Final      Susceptibility   Staphylococcus aureus - MIC*    CIPROFLOXACIN <=0.5 SENSITIVE Sensitive     ERYTHROMYCIN 0.5 SENSITIVE Sensitive     GENTAMICIN <=0.5 SENSITIVE Sensitive     OXACILLIN 0.5 SENSITIVE Sensitive     TETRACYCLINE <=1 SENSITIVE Sensitive     VANCOMYCIN 1 SENSITIVE Sensitive     TRIMETH/SULFA <=10 SENSITIVE Sensitive     CLINDAMYCIN <=0.25 SENSITIVE Sensitive     RIFAMPIN <=0.5 SENSITIVE Sensitive     Inducible Clindamycin NEGATIVE Sensitive     * STAPHYLOCOCCUS AUREUS  Blood Culture ID Panel (Reflexed)     Status: Abnormal   Collection Time: 05/28/22 10:41 AM  Result Value Ref Range Status   Enterococcus faecalis NOT DETECTED NOT DETECTED Final   Enterococcus Faecium NOT DETECTED NOT DETECTED Final   Listeria monocytogenes NOT DETECTED NOT DETECTED Final   Staphylococcus species DETECTED (Shawne Bulow) NOT DETECTED Final    Comment: CRITICAL RESULT CALLED TO, READ BACK BY AND VERIFIED WITH: PHARMD C DAVID [id number] AT 1406 BY CM    Staphylococcus aureus (BCID) DETECTED (Meena Barrantes) NOT DETECTED Final    Comment: CRITICAL RESULT CALLED TO, READ BACK BY AND VERIFIED WITH: PHARMD C DAVIS [id number] AT 1406 BY CM    Staphylococcus epidermidis NOT DETECTED NOT DETECTED Final   Staphylococcus lugdunensis NOT DETECTED NOT DETECTED Final   Streptococcus species NOT DETECTED NOT DETECTED Final   Streptococcus agalactiae NOT DETECTED NOT DETECTED Final   Streptococcus pneumoniae NOT DETECTED NOT DETECTED Final   Streptococcus pyogenes NOT DETECTED NOT DETECTED Final   Zaydn Gutridge.calcoaceticus-baumannii NOT DETECTED NOT DETECTED Final   Bacteroides fragilis NOT DETECTED NOT DETECTED Final   Enterobacterales NOT DETECTED NOT DETECTED Final   Enterobacter cloacae complex NOT DETECTED NOT DETECTED Final   Escherichia coli NOT DETECTED NOT DETECTED Final   Klebsiella aerogenes NOT DETECTED NOT DETECTED Final   Klebsiella oxytoca NOT DETECTED NOT DETECTED Final   Klebsiella  pneumoniae NOT DETECTED NOT DETECTED Final   Proteus species NOT DETECTED NOT DETECTED Final   Salmonella species NOT DETECTED NOT DETECTED Final   Serratia marcescens NOT DETECTED NOT DETECTED Final   Haemophilus influenzae NOT DETECTED NOT DETECTED Final   Neisseria meningitidis NOT DETECTED NOT DETECTED Final   Pseudomonas aeruginosa NOT DETECTED NOT DETECTED Final   Stenotrophomonas maltophilia NOT DETECTED NOT DETECTED Final   Candida albicans NOT DETECTED NOT DETECTED Final   Candida  auris NOT DETECTED NOT DETECTED Final   Candida glabrata NOT DETECTED NOT DETECTED Final   Candida krusei NOT DETECTED NOT DETECTED Final   Candida parapsilosis NOT DETECTED NOT DETECTED Final   Candida tropicalis NOT DETECTED NOT DETECTED Final   Cryptococcus neoformans/gattii NOT DETECTED NOT DETECTED Final   Meth resistant mecA/C and MREJ NOT DETECTED NOT DETECTED Final    Comment: Performed at [facility], [place] [address] [place], [place] [address]  Culture, blood (Routine X 2) w Reflex to ID Panel     Status: None   Collection Time: 05/30/22  6:21 AM   Specimen: BLOOD  Result Value Ref Range Status   Specimen Description BLOOD RIGHT ANTECUBITAL  Final   Special Requests   Final    BOTTLES DRAWN AEROBIC AND ANAEROBIC Blood Culture adequate volume   Culture   Final    NO GROWTH 5 DAYS Performed at [facility], [place] [address] [place], [place]  [address]    Report Status 06/04/2022 FINAL  Final  Culture, blood (Routine X 2) w Reflex to ID Panel     Status: None   Collection Time: 05/30/22  6:26 AM   Specimen: BLOOD RIGHT HAND  Result Value Ref Range Status   Specimen Description BLOOD RIGHT HAND  Final   Special Requests   Final    BOTTLES DRAWN AEROBIC AND ANAEROBIC Blood Culture adequate volume   Culture   Final    NO GROWTH 5 DAYS Performed at [facility], [place] [address], [place], [place] [address]    Report Status 06/04/2022 FINAL  Final         Radiology  Studies: ECHO TEE  Result Date: 06/05/2022    TRANSESOPHOGEAL ECHO REPORT   Patient Name:   KADIA ABAYA Date of Exam: 06/05/2022 Medical Rec #:  [id number]            Height:       67.0 in Accession #:    [id number]           Weight:       162.0 lb Date of Birth:  09-15-1951             BSA:          1.849 m Patient Age:    57 years             BP:           135/63 mmHg Patient Gender: F                    HR:           58 bpm. Exam Location:  Inpatient Procedure: Cardiac Doppler, Color Doppler and Transesophageal Echo Indications:    MSSA bacteremia, rule out endocarditis  History:        Patient has prior history of Echocardiogram examinations.                 Signs/Symptoms:Bacteremia. MSSA bacteremia.  Sonographer:    Eartha Inch Referring Phys: [id number] Little Ishikawa PROCEDURE: The transesophogeal probe was passed without difficulty through the esophogus of the patient. Imaged were obtained with the patient in Paydon Carll supine position. Sedation performed by different physician. The patient developed no complications during the procedure. IMPRESSIONS  1. Left ventricular ejection fraction, by estimation, is 60 to 65%. The left ventricle has normal function.  2. Right ventricular systolic function is normal. The right ventricular size is normal.  3. Left atrial size was severely dilated. No left atrial/left atrial appendage thrombus was detected.  4. The mitral valve is grossly normal. Mild mitral valve regurgitation.  5. The aortic valve is tricuspid. Aortic valve regurgitation is not visualized.  6. Agitated saline contrast bubble study was negative, with no evidence of any interatrial shunt. Conclusion(s)/Recommendation(s): No evidence of vegetation/infective endocarditis on this transesophageael echocardiogram. FINDINGS  Left Ventricle: Left ventricular ejection fraction, by estimation, is 60 to 65%. The left ventricle has normal function. The left ventricular internal cavity size was normal in size.  There is no left ventricular hypertrophy. Right Ventricle: The right ventricular size is normal. No increase in right ventricular wall thickness. Right ventricular systolic function is normal. Left Atrium: Left atrial size was severely dilated. No left atrial/left atrial appendage thrombus was detected. Right Atrium: Right atrial size was normal in size. Pericardium: There is no evidence of pericardial effusion. Mitral Valve: The mitral valve is grossly normal. Mild mitral valve regurgitation. Tricuspid Valve: The tricuspid valve is grossly normal. Tricuspid valve regurgitation is trivial. Aortic Valve: The aortic valve is tricuspid. Aortic valve regurgitation is not visualized. Pulmonic Valve: The pulmonic valve was grossly normal. Pulmonic valve regurgitation is trivial. Aorta: The aortic root and ascending aorta are structurally normal, with no evidence of dilitation. IAS/Shunts: No atrial level shunt detected by color flow Doppler. Agitated saline contrast was given intravenously to evaluate for intracardiac shunting. Agitated saline contrast bubble study was negative, with no evidence of any interatrial shunt. Lyman Bishop MD Electronically signed by Lyman Bishop MD Signature Date/Time: 06/05/2022/2:43:55 PM    Final    DG CHEST PORT 1 VIEW  Result Date: 06/05/2022 CLINICAL DATA:  71 year old female with shortness of breath. EXAM: PORTABLE CHEST - 1 VIEW COMPARISON:  05/27/2022 FINDINGS: The mediastinal contours are within normal limits. No cardiomegaly. The lungs are clear bilaterally without evidence of focal consolidation, pleural effusion, or pneumothorax. No acute osseous abnormality. IMPRESSION: No acute cardiopulmonary process. Electronically Signed   By: Ruthann Cancer M.D.   On: 06/05/2022 10:33    Scheduled Meds:  amLODipine  5 mg Oral BID   carvedilol  12.5 mg Oral BID WC   enoxaparin (LOVENOX) injection  40 mg Subcutaneous Q24H   irbesartan  300 mg Oral Daily   lidocaine  15 mL Oral Once    sodium chloride flush  3 mL Intravenous Q12H   trihexyphenidyl  2 mg Oral TID WC   Continuous Infusions:   ceFAZolin (ANCEF) IV 2 g (06/05/22 1504)     LOS: 8 days   Time spent: 30mn  Caldwell Powell, DO [facility]  If 7PM-7AM, please contact night-coverage [email or web address]  06/05/2022, 3:19 PM

## 2022-06-06 NOTE — Progress Notes (Signed)
[facility] for Infectious Disease  Date of Admission:  05/27/2022           Reason for visit: Follow up on MSSA bacteremia  Current antibiotics: Cefazolin  ASSESSMENT:    71 y.o. female admitted with:  MSSA bacteremia: Blood cultures at admission on 05/28/2022 positive for MSSA.  Repeat blood cultures 05/30/2022 finalized as no growth and TEE excluded endocarditis. Back pain: In the setting of prior lumbar spine surgery with instrumentation in January 2023.  Her back pain has been recalcitrant to outpatient modalities including selective nerve block in August 2023.  She had a CT scan completed prior to admission in July 2023 showing findings of possible screw loosening.  She has been evaluated by her surgeon.  Repeat MRI this admission was fortunately negative for any obvious infectious etiology. Right olecranon bursitis: Noted on imaging and exam.  Possibly septic bursitis in the setting of her bacteremia.  Fortunately she is relatively asymptomatic from this and has good range of motion. Parkinson's disease.  RECOMMENDATIONS:    Continue cefazolin Anticipating 4 weeks of therapy from negative cultures Likely to need PICC line if she is able to go to SNF for antibiotics Will discuss this with patient tomorrow and finalize antibiotic recommendations at that time is she is agreeable to this.    Principal Problem:   Bacteremia due to methicillin susceptible Staphylococcus aureus (MSSA) Active Problems:   Lumbar spinal stenosis   Parkinson's disease (HCC)   Intractable back pain   SIRS (systemic inflammatory response syndrome) (HCC)   Bipolar disorder, in partial remission, most recent episode hypomanic (HCC)   Hyperkalemia   Dehydration   Essential hypertension   Lumbar radicular pain   AKI (acute kidney injury) ([place])   Bursitis of right elbow    MEDICATIONS:    Scheduled Meds:  amLODipine  5 mg Oral BID   carvedilol  12.5 mg Oral BID WC   docusate sodium  100  mg Oral BID   enoxaparin (LOVENOX) injection  40 mg Subcutaneous Q24H   feeding supplement  237 mL Oral BID BM   irbesartan  300 mg Oral Daily   lidocaine  15 mL Oral Once   polyethylene glycol  17 g Oral Daily   sodium chloride flush  3 mL Intravenous Q12H   trihexyphenidyl  2 mg Oral TID WC   Continuous Infusions:   ceFAZolin (ANCEF) IV 2 g (06/06/22 0627)   PRN Meds:.acetaminophen **OR** acetaminophen, albuterol, ALPRAZolam, alum & mag hydroxide-simeth, hydrALAZINE, HYDROcodone-acetaminophen, HYDROmorphone, ondansetron, mouth rinse  SUBJECTIVE:   24 hour events:  No acute events overnight She underwent TEE which was fortunately negative She remains afebrile She is on cefazolin Her renal function is overall stable WBC 8.1 Repeat blood cultures 8/31 finalized as no growth  She reports feeling well overall improved.  She continues to report back pain.  She has some gas right now from drinking too much ginger ale.  She is hoping to go to rehab after discharge.  Review of Systems  All other systems reviewed and are negative.     OBJECTIVE:   Blood pressure (!) 156/65, pulse (!) 59, temperature 98.7 F (37.1 C), temperature source Oral, resp. rate 18, height '5\' 7"'$  (1.702 m), weight 73.5 kg, SpO2 95 %. Body mass index is 25.37 kg/m.  Physical Exam Constitutional:      General: She is not in acute distress.    Appearance: Normal appearance.  HENT:     Head: Normocephalic  and atraumatic.  Abdominal:     General: There is no distension.     Palpations: Abdomen is soft.  Musculoskeletal:     Comments: She is able to flex and extend her elbow without any difficulty and there is no tenderness to palpation.  There is some mild erythema over her olecranon with a scabbed lesion but no active drainage right now.  Neurological:     General: No focal deficit present.     Mental Status: She is alert and oriented to person, place, and time.  Psychiatric:        Mood and Affect:  Mood normal.        Behavior: Behavior normal.      Lab Results: Lab Results  Component Value Date   WBC 8.1 06/04/2022   HGB 10.9 (L) 06/04/2022   HCT 32.7 (L) 06/04/2022   MCV 92.9 06/04/2022   PLT 325 06/04/2022    Lab Results  Component Value Date   NA 133 (L) 06/04/2022   K 4.4 06/04/2022   CO2 28 06/04/2022   GLUCOSE 160 (H) 06/04/2022   BUN 40 (H) 06/04/2022   CREATININE 1.22 (H) 06/04/2022   CALCIUM 8.4 (L) 06/04/2022   GFRNONAA 47 (L) 06/04/2022   GFRAA >60 12/20/2017    Lab Results  Component Value Date   ALT 9 06/04/2022   AST 17 06/04/2022   ALKPHOS 93 06/04/2022   BILITOT 0.3 06/04/2022       Component Value Date/Time   CRP 32.1 (H) 05/28/2022 1613       Component Value Date/Time   ESRSEDRATE 41 (H) 05/28/2022 1613     I have reviewed the micro and lab results in Epic.  Imaging: ECHO TEE  Result Date: 06/05/2022    TRANSESOPHOGEAL ECHO REPORT   Patient Name:   Kristin Gilmore Date of Exam: 06/05/2022 Medical Rec #:  [id number]            Height:       67.0 in Accession #:    [id number]           Weight:       162.0 lb Date of Birth:  [birth year]             BSA:          1.849 m Patient Age:    18 years             BP:           135/63 mmHg Patient Gender: F                    HR:           58 bpm. Exam Location:  Inpatient Procedure: Cardiac Doppler, Color Doppler and Transesophageal Echo Indications:    MSSA bacteremia, rule out endocarditis  History:        Patient has prior history of Echocardiogram examinations.                 Signs/Symptoms:Bacteremia. MSSA bacteremia.  Sonographer:    Eartha Inch Referring Phys: [id number] Little Ishikawa PROCEDURE: The transesophogeal probe was passed without difficulty through the esophogus of the patient. Imaged were obtained with the patient in a supine position. Sedation performed by different physician. The patient developed no complications during the procedure. IMPRESSIONS  1. Left ventricular  ejection fraction, by estimation, is 60 to 65%. The left ventricle has normal function.  2. Right ventricular systolic function is normal.  The right ventricular size is normal.  3. Left atrial size was severely dilated. No left atrial/left atrial appendage thrombus was detected.  4. The mitral valve is grossly normal. Mild mitral valve regurgitation.  5. The aortic valve is tricuspid. Aortic valve regurgitation is not visualized.  6. Agitated saline contrast bubble study was negative, with no evidence of any interatrial shunt. Conclusion(s)/Recommendation(s): No evidence of vegetation/infective endocarditis on this transesophageael echocardiogram. FINDINGS  Left Ventricle: Left ventricular ejection fraction, by estimation, is 60 to 65%. The left ventricle has normal function. The left ventricular internal cavity size was normal in size. There is no left ventricular hypertrophy. Right Ventricle: The right ventricular size is normal. No increase in right ventricular wall thickness. Right ventricular systolic function is normal. Left Atrium: Left atrial size was severely dilated. No left atrial/left atrial appendage thrombus was detected. Right Atrium: Right atrial size was normal in size. Pericardium: There is no evidence of pericardial effusion. Mitral Valve: The mitral valve is grossly normal. Mild mitral valve regurgitation. Tricuspid Valve: The tricuspid valve is grossly normal. Tricuspid valve regurgitation is trivial. Aortic Valve: The aortic valve is tricuspid. Aortic valve regurgitation is not visualized. Pulmonic Valve: The pulmonic valve was grossly normal. Pulmonic valve regurgitation is trivial. Aorta: The aortic root and ascending aorta are structurally normal, with no evidence of dilitation. IAS/Shunts: No atrial level shunt detected by color flow Doppler. Agitated saline contrast was given intravenously to evaluate for intracardiac shunting. Agitated saline contrast bubble study was negative, with no  evidence of any interatrial shunt. Lyman Bishop MD Electronically signed by Lyman Bishop MD Signature Date/Time: 06/05/2022/2:43:55 PM    Final    DG CHEST PORT 1 VIEW  Result Date: 06/05/2022 CLINICAL DATA:  71 year old female with shortness of breath. EXAM: PORTABLE CHEST - 1 VIEW COMPARISON:  05/27/2022 FINDINGS: The mediastinal contours are within normal limits. No cardiomegaly. The lungs are clear bilaterally without evidence of focal consolidation, pleural effusion, or pneumothorax. No acute osseous abnormality. IMPRESSION: No acute cardiopulmonary process. Electronically Signed   By: Ruthann Cancer M.D.   On: 06/05/2022 10:33     Imaging independently reviewed in Epic.    [facility] [phone] pager 06/06/2022, 12:00 PM

## 2022-06-07 ENCOUNTER — Inpatient Hospital Stay: Payer: Self-pay

## 2022-06-07 DIAGNOSIS — M7021 Olecranon bursitis, right elbow: Secondary | ICD-10-CM | POA: Diagnosis not present

## 2022-06-07 DIAGNOSIS — B9561 Methicillin susceptible Staphylococcus aureus infection as the cause of diseases classified elsewhere: Secondary | ICD-10-CM | POA: Diagnosis not present

## 2022-06-07 DIAGNOSIS — M549 Dorsalgia, unspecified: Secondary | ICD-10-CM | POA: Diagnosis not present

## 2022-06-07 DIAGNOSIS — R7881 Bacteremia: Secondary | ICD-10-CM | POA: Diagnosis not present

## 2022-06-07 DIAGNOSIS — G20 Parkinson's disease: Secondary | ICD-10-CM | POA: Diagnosis not present

## 2022-06-07 LAB — CBC WITH DIFFERENTIAL/PLATELET
Abs Immature Granulocytes: 0.06 10*3/uL (ref 0.00–0.07)
Basophils Absolute: 0 10*3/uL (ref 0.0–0.1)
Basophils Relative: 0 %
Eosinophils Absolute: 0.1 10*3/uL (ref 0.0–0.5)
Eosinophils Relative: 1 %
HCT: 34.8 % — ABNORMAL LOW (ref 36.0–46.0)
Hemoglobin: 11.5 g/dL — ABNORMAL LOW (ref 12.0–15.0)
Immature Granulocytes: 1 %
Lymphocytes Relative: 24 %
Lymphs Abs: 1.7 10*3/uL (ref 0.7–4.0)
MCH: 31.1 pg (ref 26.0–34.0)
MCHC: 33 g/dL (ref 30.0–36.0)
MCV: 94.1 fL (ref 80.0–100.0)
Monocytes Absolute: 0.7 10*3/uL (ref 0.1–1.0)
Monocytes Relative: 10 %
Neutro Abs: 4.6 10*3/uL (ref 1.7–7.7)
Neutrophils Relative %: 64 %
Platelets: 430 10*3/uL — ABNORMAL HIGH (ref 150–400)
RBC: 3.7 MIL/uL — ABNORMAL LOW (ref 3.87–5.11)
RDW: 12.6 % (ref 11.5–15.5)
WBC: 7.2 10*3/uL (ref 4.0–10.5)
nRBC: 0 % (ref 0.0–0.2)

## 2022-06-07 LAB — COMPREHENSIVE METABOLIC PANEL
ALT: 8 U/L (ref 0–44)
AST: 17 U/L (ref 15–41)
Albumin: 3.1 g/dL — ABNORMAL LOW (ref 3.5–5.0)
Alkaline Phosphatase: 94 U/L (ref 38–126)
Anion gap: 13 (ref 5–15)
BUN: 28 mg/dL — ABNORMAL HIGH (ref 8–23)
CO2: 26 mmol/L (ref 22–32)
Calcium: 9.4 mg/dL (ref 8.9–10.3)
Chloride: 96 mmol/L — ABNORMAL LOW (ref 98–111)
Creatinine, Ser: 0.99 mg/dL (ref 0.44–1.00)
GFR, Estimated: >60 mL/min (ref >60)
Glucose, Bld: 124 mg/dL — ABNORMAL HIGH (ref 70–99)
Potassium: 4.2 mmol/L (ref 3.5–5.1)
Sodium: 135 mmol/L (ref 135–145)
Total Bilirubin: 0.3 mg/dL (ref 0.3–1.2)
Total Protein: 6.7 g/dL (ref 6.5–8.1)

## 2022-06-07 LAB — MAGNESIUM: Magnesium: 2 mg/dL (ref 1.7–2.4)

## 2022-06-07 LAB — PHOSPHORUS: Phosphorus: 3.5 mg/dL (ref 2.5–4.6)

## 2022-06-07 MED ORDER — CHLORHEXIDINE GLUCONATE CLOTH 2 % EX PADS
6.0000 | MEDICATED_PAD | Freq: Every day | CUTANEOUS | Status: DC
  Administered 2022-06-07 – 2022-06-08 (×2): 6 via TOPICAL

## 2022-06-07 MED ORDER — SODIUM CHLORIDE 0.9% FLUSH
10.0000 mL | Freq: Two times a day (BID) | INTRAVENOUS | Status: DC
  Administered 2022-06-08: 10 mL

## 2022-06-07 MED ORDER — SODIUM CHLORIDE 0.9% FLUSH: 10.0000 mL | INTRAVENOUS | Status: DC | PRN

## 2022-06-07 NOTE — Progress Notes (Signed)
[facility] for Infectious Disease  Date of Admission:  05/27/2022           Reason for visit: Follow up on MSSA bacteremia/septic bursitis   Current antibiotics: Cefazolin    ASSESSMENT:    71 y.o. female admitted with:  MSSA bacteremia: Blood cultures at admission on 05/28/2022 positive for MSSA.  Repeat blood cultures 05/30/2022 finalized as no growth and TEE excluded endocarditis. Right olecranon septic bursitis: Noted on imaging and exam.  Presumed septic bursitis in the setting of her bacteremia.  Fortunately she is relatively asymptomatic from this and has good range of motion. Back pain: In the setting of prior lumbar spine      surgery with instrumentation in January 2023.  Her back pain has been recalcitrant to outpatient modalities including selective nerve block in August 2023.  She had a CT scan completed prior to admission in July 2023 showing findings of possible screw loosening.  She has been evaluated by her surgeon.  Repeat MRI this admission was fortunately negative for any obvious infectious etiology. Parkinson's disease.  RECOMMENDATIONS:    Continue cefazolin See OPAT note Place PICC line Will sign off, please call as needed  Diagnosis: MSSA bacteremia/septic bursitis  Culture Result: MSSA  Allergies  Allergen Reactions   Levofloxacin Other (See Comments)     Dizziness (intolerance)   Amoxicillin Hives    UNSPECIFIED REACTION  Has patient had a PCN reaction causing immediate rash, facial/tongue/throat swelling, SOB or lightheadedness with hypotension: Unknown Has patient had a PCN reaction causing severe rash involving mucus membranes or skin necrosis: Unknown Has patient had a PCN reaction that required hospitalization: Unknown Has patient had a PCN reaction occurring within the last 10 years: No If all of the above answers are "NO", then may proceed with Cephalosporin use. Sweats     Clarithromycin Hives, Other (See Comments) and Rash    Atropine Hives   Fish Allergy Other (See Comments)    Childhood reaction. Pt denies further reaction.    OPAT Orders Discharge antibiotics to be given via PICC line Discharge antibiotics: Per pharmacy protocol Cefazolin 2gm q8h  Duration: 4 weeks  End Date: 06/27/22  [facility] Care Per Protocol:  Home health RN for IV administration and teaching; PICC line care and labs.    Labs weekly while on IV antibiotics: _xxx_ CBC with differential __xxx BMP __ CMP __ CRP __ ESR __ Vancomycin trough __ CK  _xxx_ Please pull PIC at completion of IV antibiotics __ Please leave PIC in place until doctor has seen patient or been notified  Fax weekly labs to [phone]  Clinic Follow Up Appt: 06/19/22 at 4pm with Dr Juleen China    Principal Problem:   Bacteremia due to methicillin susceptible Staphylococcus aureus (MSSA) Active Problems:   Lumbar spinal stenosis   Parkinson's disease (HCC)   Intractable back pain   SIRS (systemic inflammatory response syndrome) (HCC)   Bipolar disorder, in partial remission, most recent episode hypomanic ([place])   Hyperkalemia   Dehydration   Essential hypertension   Lumbar radicular pain   AKI (acute kidney injury) ([place])   Bursitis of right elbow    MEDICATIONS:    Scheduled Meds:  amLODipine  5 mg Oral BID   carvedilol  12.5 mg Oral BID WC   docusate sodium  100 mg Oral BID   enoxaparin (LOVENOX) injection  40 mg Subcutaneous Q24H   feeding supplement  237 mL Oral BID BM   irbesartan  300 mg Oral Daily   lidocaine  15 mL Oral Once   polyethylene glycol  17 g Oral Daily   sodium chloride flush  3 mL Intravenous Q12H   trihexyphenidyl  2 mg Oral TID WC   Continuous Infusions:   ceFAZolin (ANCEF) IV 2 g (06/07/22 0539)   PRN Meds:.acetaminophen **OR** acetaminophen, albuterol, ALPRAZolam, alum & mag hydroxide-simeth, hydrALAZINE, HYDROcodone-acetaminophen, HYDROmorphone, ondansetron, mouth rinse  SUBJECTIVE:   24 hour events:  No  new events  No new complaints Getting ready to use the commode Discussed PICC line  Review of Systems  All other systems reviewed and are negative.     OBJECTIVE:   Blood pressure (!) 149/82, pulse (!) 54, temperature 98.3 F (36.8 C), temperature source Oral, resp. rate 16, height '5\' 7"'  (1.702 m), weight 73.5 kg, SpO2 96 %. Body mass index is 25.37 kg/m.  Physical Exam Constitutional:      Appearance: Normal appearance.  HENT:     Head: Normocephalic and atraumatic.  Eyes:     Extraocular Movements: Extraocular movements intact.     Conjunctiva/sclera: Conjunctivae normal.  Pulmonary:     Effort: Pulmonary effort is normal. No respiratory distress.  Abdominal:     General: There is no distension.     Palpations: Abdomen is soft.  Skin:    General: Skin is warm and dry.  Neurological:     General: No focal deficit present.     Mental Status: She is alert and oriented to person, place, and time.      Lab Results: Lab Results  Component Value Date   WBC 7.2 06/07/2022   HGB 11.5 (L) 06/07/2022   HCT 34.8 (L) 06/07/2022   MCV 94.1 06/07/2022   PLT 430 (H) 06/07/2022    Lab Results  Component Value Date   NA 135 06/07/2022   K 4.2 06/07/2022   CO2 26 06/07/2022   GLUCOSE 124 (H) 06/07/2022   BUN 28 (H) 06/07/2022   CREATININE 0.99 06/07/2022   CALCIUM 9.4 06/07/2022   GFRNONAA >60 06/07/2022   GFRAA >60 12/20/2017    Lab Results  Component Value Date   ALT 8 06/07/2022   AST 17 06/07/2022   ALKPHOS 94 06/07/2022   BILITOT 0.3 06/07/2022       Component Value Date/Time   CRP 32.1 (H) 05/28/2022 1613       Component Value Date/Time   ESRSEDRATE 41 (H) 05/28/2022 1613     I have reviewed the micro and lab results in Epic.  Imaging: [place] EKG SITE RITE  Result Date: 06/07/2022 If Site Rite image not attached, placement could not be confirmed due to current cardiac rhythm.  ECHO TEE  Result Date: 06/05/2022    TRANSESOPHOGEAL ECHO REPORT    Patient Name:   KAMALJIT HIZER Date of Exam: 06/05/2022 Medical Rec #:  [id number]            Height:       67.0 in Accession #:    [id number]           Weight:       162.0 lb Date of Birth:  28-Jun-1951             BSA:          1.849 m Patient Age:    71 years             BP:           135/63 mmHg Patient Gender: F  HR:           58 bpm. Exam Location:  Inpatient Procedure: Cardiac Doppler, Color Doppler and Transesophageal Echo Indications:    MSSA bacteremia, rule out endocarditis  History:        Patient has prior history of Echocardiogram examinations.                 Signs/Symptoms:Bacteremia. MSSA bacteremia.  Sonographer:    Eartha Inch Referring Phys: [id number] Little Ishikawa PROCEDURE: The transesophogeal probe was passed without difficulty through the esophogus of the patient. Imaged were obtained with the patient in a supine position. Sedation performed by different physician. The patient developed no complications during the procedure. IMPRESSIONS  1. Left ventricular ejection fraction, by estimation, is 60 to 65%. The left ventricle has normal function.  2. Right ventricular systolic function is normal. The right ventricular size is normal.  3. Left atrial size was severely dilated. No left atrial/left atrial appendage thrombus was detected.  4. The mitral valve is grossly normal. Mild mitral valve regurgitation.  5. The aortic valve is tricuspid. Aortic valve regurgitation is not visualized.  6. Agitated saline contrast bubble study was negative, with no evidence of any interatrial shunt. Conclusion(s)/Recommendation(s): No evidence of vegetation/infective endocarditis on this transesophageael echocardiogram. FINDINGS  Left Ventricle: Left ventricular ejection fraction, by estimation, is 60 to 65%. The left ventricle has normal function. The left ventricular internal cavity size was normal in size. There is no left ventricular hypertrophy. Right Ventricle: The right  ventricular size is normal. No increase in right ventricular wall thickness. Right ventricular systolic function is normal. Left Atrium: Left atrial size was severely dilated. No left atrial/left atrial appendage thrombus was detected. Right Atrium: Right atrial size was normal in size. Pericardium: There is no evidence of pericardial effusion. Mitral Valve: The mitral valve is grossly normal. Mild mitral valve regurgitation. Tricuspid Valve: The tricuspid valve is grossly normal. Tricuspid valve regurgitation is trivial. Aortic Valve: The aortic valve is tricuspid. Aortic valve regurgitation is not visualized. Pulmonic Valve: The pulmonic valve was grossly normal. Pulmonic valve regurgitation is trivial. Aorta: The aortic root and ascending aorta are structurally normal, with no evidence of dilitation. IAS/Shunts: No atrial level shunt detected by color flow Doppler. Agitated saline contrast was given intravenously to evaluate for intracardiac shunting. Agitated saline contrast bubble study was negative, with no evidence of any interatrial shunt. Lyman Bishop MD Electronically signed by Lyman Bishop MD Signature Date/Time: 06/05/2022/2:43:55 PM    Final      Imaging independently reviewed in Epic.    [facility] for [facility] [phone] pager 06/07/2022, 10:59 AM  I have personally spent 50 minutes involved in face-to-face and non-face-to-face activities for this patient on the day of the visit. Professional time spent includes the following activities: Preparing to see the patient (review of tests), Obtaining and/or reviewing separately obtained history (admission/discharge record), Performing a medically appropriate examination and/or evaluation , Ordering medications/tests/procedures, referring and communicating with other health care professionals, Documenting clinical information in the EMR, Independently interpreting results (not separately  reported), Communicating results to the patient/family/caregiver, Counseling and educating the patient/family/caregiver and Care coordination (not separately reported).

## 2022-06-07 NOTE — TOC Progression Note (Signed)
Transition of Care [facility]) - Progression Note    Patient Details  Name: CONSTANCE WHITTLE MRN: [id number] Date of Birth: March 09, 1951  [facility] CM/SW Contact  Reece Agar, [place] Phone Number: 06/07/2022, 11:57 AM  Clinical Narrative:    CSW contacted Whitney at [facility] to inquire about pt IV ABX being set up at home if she DC's before September 28th. Whitney stated they can have the ABX setup.   Whitney provided CSW with information for admission tomorrow. Pt will be in room 108 and the number to call for report will be [phone]. Loree Fee will not be on call tomorrow but provided CSW with the number for Peggy [phone]).   Expected Discharge Plan: [place] Barriers to Discharge: Continued Medical Work up  Expected Discharge Plan and Services Expected Discharge Plan: [place] In-house Referral: Clinical Social Work   Post Acute Care Choice: [place] Living arrangements for the past 2 months: Single Family Home                                       Social Determinants of Health (SDOH) Interventions    Readmission Risk Interventions     No data to display

## 2022-06-07 NOTE — Progress Notes (Signed)
PHARMACY CONSULT NOTE FOR:  OUTPATIENT  PARENTERAL ANTIBIOTIC THERAPY (OPAT)  Indication: MSSA bacteremia/septic bursitis Regimen: Cefazolin 2 gm IV Q 8 hours  End date: 06/27/22  IV antibiotic discharge orders are pended. To discharging provider:  please sign these orders via discharge navigator,  Select New Orders & click on the button choice - Manage This Unsigned Work.     Thank you for allowing pharmacy to be a part of this patient's care.  Jimmy Footman, PharmD, BCPS, [facility] Clinical Pharmacist Phone: [phone] 06/07/2022, 8:58 AM

## 2022-06-07 NOTE — Progress Notes (Signed)
Peripherally Inserted Central Catheter Placement  The IV Nurse has discussed with the patient and/or persons authorized to consent for the patient, the purpose of this procedure and the potential benefits and risks involved with this procedure.  The benefits include less needle sticks, lab draws from the catheter, and the patient may be discharged home with the catheter. Risks include, but not limited to, infection, bleeding, blood clot (thrombus formation), and puncture of an artery; nerve damage and irregular heartbeat and possibility to perform a PICC exchange if needed/ordered by physician.  Alternatives to this procedure were also discussed.  Bard Power PICC patient education guide, fact sheet on infection prevention and patient information card has been provided to patient /or left at bedside.    PICC Placement Documentation  PICC Single Lumen 54/27/06 Right Cephalic 37 cm 0 cm (Active)  Indication for Insertion or Continuance of Line Prolonged intravenous therapies;Home intravenous therapies (PICC only) 06/07/22 1700  Exposed Catheter (cm) 0 cm 06/07/22 1700  Site Assessment Clean, Dry, Intact 06/07/22 1700  Line Status Flushed;Saline locked;Blood return noted 06/07/22 1700  Dressing Type Transparent;Securing device 06/07/22 1700  Dressing Status Antimicrobial disc in place 06/07/22 1700  Safety Lock Not Applicable 23/76/28 3151  Line Care Connections checked and tightened 06/07/22 1700  Line Adjustment (NICU/IV Team Only) No 06/07/22 1700  Dressing Intervention New dressing 06/07/22 1700  Dressing Change Due 06/14/22 06/07/22 [place] 06/07/2022, 5:13 PM

## 2022-06-07 NOTE — Progress Notes (Addendum)
PROGRESS NOTE    Kristin Gilmore  FVC:[id number] DOB: 11-Jul-1951 DOA: 05/27/2022 PCP: [facility] @ [place]   Brief Narrative:  Kristin Gilmore is Kristin Gilmore 71 y.o. female with medical history significant of hypertension, dyslipidemia, prediabetes, anxiety, depression who presents with complaints of worsening back pain, denies trauma. Patient reports having severe pain in her lower back with radiation down the left leg. Sees Dr. Louanne Skye and has issues with chronic pain in this area.  They were trying to arrange for her to have an outpatient nerve block, but have been waiting on insurance approval.  She reports that she last had surgery of her lumbar spine back in January of this year.  She denies having any significant fevers, chills, saddle anesthesia, or  incontinence.  06/03/2022: The patient was seen and examined at bedside.  Reports pain in her right knee and lower back.  X-ray done on 04/09/2022 showed fracture to the lateral portion of the patella.  Component looks stable.  Fracture appears to be chronic.  Had an MRI lumbar spine with and without contrast on 05/30/2022.  Assessment & Plan:   Principal Problem:   Bacteremia due to methicillin susceptible Staphylococcus aureus (MSSA) Active Problems:   Intractable back pain   SIRS (systemic inflammatory response syndrome) (HCC)   Lumbar spinal stenosis   Parkinson's disease (HCC)   Bipolar disorder, in partial remission, most recent episode hypomanic ([place])   Hyperkalemia   Dehydration   Essential hypertension   Lumbar radicular pain   AKI (acute kidney injury) ([place])   Bursitis of right elbow  Chest pain Unclear cause, now resolved.  She notes she has this intermittently, is not interested in working this up further. She refused EKG ordered to w/u.  CXR without acute cardiopulm abnormality. TEE today without noted wall motion abnormality  Sepsis secondary to MSSA bacteremia, POA Unclear source Blood cultures  positive for MSSA, pansensitive.   ID following  Continue cefazolin as recommended by ID.   -Multiple imaging looking for source of infection thus far been unrevealing. -ESR 41, CRP 32, lactic acid normal, INR normal -TTE performed previously although patient became somewhat combative and the test was not completed, cardiology able to read what imaging was taken without any overt findings of valvular abnormalities. -CT of left elbow 06/02/2022 showed olecranon bursitis with bursal distension measuring 3.1 x 0.9 x 3.2 cm. Mild adjacent soft tissue swelling.  Also has significant RLE knee pain (has hx right knee replacement) -> though this has resolved, unclear significance, but low suspicion for septic joint (discussed with orthopedics) Appreciate ortho recs - recommending medical management with antibiotics for olecranon bursitis TEE without evidence of vegetation/infective endocarditis   Essential hypertension BP stable Continue home oral antihypertensives Continue to monitor vital signs.   Chronic anxiety and depression -Currently on Xanax only, med rec pending for patient's chronic medications -Current unconfirmed medications listed include amitriptyline, Seroquel, Effexor   Prediabetes Last hemoglobin A1c 5.8 on 06/22/2021.   Home medication regimen appears to include glipizide. Hold off oral hypoglycemics Diet-controlled   Parkinson's disease Continue Home medication regimen includes Artane 2 mg 3 times daily.  Lower back pain; acute on chronic, chronic history of lumbar stenosis with radiculopathy, POA - Acute on chronic pain without deficits or paresthesias. - Initial plan for neurology evaluation and potential nerve block however this appears to have been done previously this month with minimal to no improvement, as such will not be repeated given limited therapeutics but increased risk of  complications. -MRI without overt findings to explain worsening back pain - discussed with  ortho -Continue home pain meds including hydrocodone and p.o. Dilaudid - appears to be moderately well controlled at this time, not pain-free but again near her baseline. -Dr. Louanne Skye orthopedics following, appreciate insight and recommendations - likely require outpatient follow up given no indication for procedure/intervention at this time  DVT prophylaxis: Subcu Lovenox daily Code Status: Full Family Communication: None present  Status is: Inpatient  Dispo: The patient is from: Home              Anticipated d/c is to: Home              Anticipated d/c date is: 9/9 to SNF              Patient currently not medically stable for discharge  Consultants:  Orthopedic surgery, cardiology, infectious disease  Procedures:  None  Antimicrobials:  Anti-infectives (From admission, onward)    Start     Dose/Rate Route Frequency Ordered Stop   05/29/22 2000  ceFAZolin (ANCEF) IVPB 2g/100 mL premix        2 g 200 mL/hr over 30 Minutes Intravenous Every 8 hours 05/29/22 1517 06/27/22 2359   05/29/22 1000  vancomycin (VANCOREADY) IVPB 1250 mg/250 mL  Status:  Discontinued        1,250 mg 166.7 mL/hr over 90 Minutes Intravenous Every 24 hours 05/28/22 1308 05/29/22 1517   05/28/22 2200  ceFEPIme (MAXIPIME) 2 g in sodium chloride 0.9 % 100 mL IVPB  Status:  Discontinued        2 g 200 mL/hr over 30 Minutes Intravenous Every 12 hours 05/28/22 1308 05/29/22 1517   05/28/22 1030  ceFEPIme (MAXIPIME) 2 g in sodium chloride 0.9 % 100 mL IVPB        2 g 200 mL/hr over 30 Minutes Intravenous  Once 05/28/22 0936 05/28/22 1117   05/28/22 1030  metroNIDAZOLE (FLAGYL) IVPB 500 mg  Status:  Discontinued        500 mg 100 mL/hr over 60 Minutes Intravenous Every 12 hours 05/28/22 0936 05/29/22 1517   05/28/22 1030  vancomycin (VANCOREADY) IVPB 1500 mg/300 mL        1,500 mg 150 mL/hr over 120 Minutes Intravenous  Once 05/28/22 0936 05/28/22 1347        Subjective: Feeling progressively  better  Objective: Vitals:   06/05/22 1022 06/05/22 1154 06/05/22 1204 06/05/22 1225  BP: 135/63 128/61 100/76 (!) 140/69  Pulse: 66 67 60 62  Resp: _0 Temp:  98 F (36.7 C)  98 F (36.7 C)  TempSrc:  Temporal  Oral  SpO2: 98% 96% 95% 92%  Weight:      Height:        Intake/Output Summary (Last 24 hours) at 06/05/2022 1519 Last data filed at 06/05/2022 1154 Gross per 24 hour  Intake 200 ml  Output 200 ml  Net 0 ml   Filed Weights   05/27/22 0757 05/30/22 0806  Weight: 72.6 kg 73.5 kg    Examination:  General: No acute distress. Cardiovascular: RRR Lungs: unlabored Abdomen: Soft, nontender, nondistended Neurological: Alert and oriented 3. Moves all extremities 4 with equal strength. Cranial nerves II through XII grossly intact. Extremities: no TTP to R knee   Data Reviewed: I have personally reviewed following labs and imaging studies  CBC: Recent Labs  Lab 05/30/22 0621 05/31/22 0737 06/01/22 0626 06/02/22 0649 06/04/22 0609  WBC 12.1* 9.3  13.3* 11.8* 8.1  HGB 10.9* 11.4* 12.1 12.2 10.9*  HCT 31.9* 33.1* 37.0 36.5 32.7*  MCV 92.7 91.4 92.7 91.9 92.9  PLT 213 242 339 312 003   Basic Metabolic Panel: Recent Labs  Lab 05/30/22 0621 05/31/22 0737 06/01/22 0626 06/02/22 0649 06/04/22 0609  NA 135 136 138 134* 133*  K 4.3 4.0 4.4 4.2 4.4  CL 101 101 101 96* 96*  CO2 _0 GLUCOSE 165* 188* 132* 165* 160*  BUN 17 26* 25* 26* 40*  CREATININE 1.00 0.92 0.89 1.01* 1.22*  CALCIUM 8.7* 9.0 9.4 8.9 8.4*   GFR: Estimated Creatinine Clearance: 40.5 mL/min (Dominik Yordy) (by C-G formula based on SCr of 1.22 mg/dL (H)). Liver Function Tests: Recent Labs  Lab 05/30/22 0621 05/31/22 0737 06/01/22 0626 06/02/22 0649 06/04/22 0609  AST _1 13* 17  ALT _2 ALKPHOS 73 77 84 78 93  BILITOT 0.5 0.3 0.4 0.5 0.3  PROT 6.1* 6.2* 6.8 6.4* 5.7*  ALBUMIN 2.7* 2.7* 3.0* 2.8* 2.6*   No results for input(s): "LIPASE", "AMYLASE" in the last  168 hours. No results for input(s): "AMMONIA" in the last 168 hours. Coagulation Profile: No results for input(s): "INR", "PROTIME" in the last 168 hours.  Cardiac Enzymes: No results for input(s): "CKTOTAL", "CKMB", "CKMBINDEX", "TROPONINI" in the last 168 hours. BNP (last 3 results) No results for input(s): "PROBNP" in the last 8760 hours. HbA1C: No results for input(s): "HGBA1C" in the last 72 hours. CBG: No results for input(s): "GLUCAP" in the last 168 hours. Lipid Profile: No results for input(s): "CHOL", "HDL", "LDLCALC", "TRIG", "CHOLHDL", "LDLDIRECT" in the last 72 hours. Thyroid Function Tests: No results for input(s): "TSH", "T4TOTAL", "FREET4", "T3FREE", "THYROIDAB" in the last 72 hours. Anemia Panel: No results for input(s): "VITAMINB12", "FOLATE", "FERRITIN", "TIBC", "IRON", "RETICCTPCT" in the last 72 hours. Sepsis Labs: No results for input(s): "PROCALCITON", "LATICACIDVEN" in the last 168 hours.   Recent Results (from the past 240 hour(s))  Resp Panel by RT-PCR (Flu Taralyn Ferraiolo&B, Covid) Urine, Clean Catch     Status: None   Collection Time: 05/27/22  8:11 PM   Specimen: Urine, Clean Catch; Nasal Swab  Result Value Ref Range Status   SARS Coronavirus 2 by RT PCR NEGATIVE NEGATIVE Final    Comment: (NOTE) SARS-CoV-2 target nucleic acids are NOT DETECTED.  The SARS-CoV-2 RNA is generally detectable in upper respiratory specimens during the acute phase of infection. The lowest concentration of SARS-CoV-2 viral copies this assay can detect is 138 copies/mL. Anysia Choi negative result does not preclude SARS-Cov-2 infection and should not be used as the sole basis for treatment or other patient management decisions. Zelig Gacek negative result may occur with  improper specimen collection/handling, submission of specimen other than nasopharyngeal swab, presence of viral mutation(s) within the areas targeted by this assay, and inadequate number of viral copies(<138 copies/mL). Ainsley Deakins negative result  must be combined with clinical observations, patient history, and epidemiological information. The expected result is Negative.  Fact Sheet for Patients:  EntrepreneurPulse.com.au  Fact Sheet for Healthcare Providers:  IncredibleEmployment.be  This test is no t yet approved or cleared by the [place] FDA and  has been authorized for detection and/or diagnosis of SARS-CoV-2 by FDA under an Emergency Use Authorization (EUA). This EUA will remain  in effect (meaning this test can be used) for the duration of the COVID-19 declaration under Section 564(b)(1) of the Act, 21 U.S.C.section 360bbb-3(b)(1), unless the authorization is  terminated  or revoked sooner.       Influenza Mallie Linnemann by PCR NEGATIVE NEGATIVE Final   Influenza B by PCR NEGATIVE NEGATIVE Final    Comment: (NOTE) The Xpert Xpress SARS-CoV-2/FLU/RSV plus assay is intended as an aid in the diagnosis of influenza from Nasopharyngeal swab specimens and should not be used as Matricia Begnaud sole basis for treatment. Nasal washings and aspirates are unacceptable for Xpert Xpress SARS-CoV-2/FLU/RSV testing.  Fact Sheet for Patients: EntrepreneurPulse.com.au  Fact Sheet for Healthcare Providers: IncredibleEmployment.be  This test is not yet approved or cleared by the [place] FDA and has been authorized for detection and/or diagnosis of SARS-CoV-2 by FDA under an Emergency Use Authorization (EUA). This EUA will remain in effect (meaning this test can be used) for the duration of the COVID-19 declaration under Section 564(b)(1) of the Act, 21 U.S.C. section 360bbb-3(b)(1), unless the authorization is terminated or revoked.  Performed at [facility], [address] [place], [place] [address]   Culture, blood (x 2)     Status: Abnormal   Collection Time: 05/28/22 10:38 AM   Specimen: BLOOD RIGHT ARM  Result Value Ref Range Status    Specimen Description BLOOD RIGHT ARM  Final   Special Requests   Final    BOTTLES DRAWN AEROBIC AND ANAEROBIC Blood Culture adequate volume   Culture  Setup Time   Final    GRAM POSITIVE COCCI IN CLUSTERS BOTTLES DRAWN AEROBIC ONLY CRITICAL RESULT CALLED TO, READ BACK BY AND VERIFIED WITH: PHARMD FRedmond Pulling [phone] _0  FH    Culture (Nanako Stopher)  Final    STAPHYLOCOCCUS AUREUS SUSCEPTIBILITIES PERFORMED ON PREVIOUS CULTURE WITHIN THE LAST 5 DAYS. Performed at [facility], [place] [address], [place], [place] [address]    Report Status 05/31/2022 FINAL  Final  Culture, blood (x 2)     Status: Abnormal   Collection Time: 05/28/22 10:41 AM   Specimen: BLOOD LEFT ARM  Result Value Ref Range Status   Specimen Description BLOOD LEFT ARM  Final   Special Requests   Final    BOTTLES DRAWN AEROBIC AND ANAEROBIC Blood Culture adequate volume   Culture  Setup Time   Final    GRAM POSITIVE COCCI ANAEROBIC BOTTLE ONLY CRITICAL RESULT CALLED TO, READ BACK BY AND VERIFIED WITH: PHARMD C DAVIS [id number] AT 1406 BY CM Performed at [facility], [place] [address], [place], [place] [address]    Culture STAPHYLOCOCCUS AUREUS (Kassidy Frankson)  Final   Report Status 05/31/2022 FINAL  Final   Organism ID, Bacteria STAPHYLOCOCCUS AUREUS  Final      Susceptibility   Staphylococcus aureus - MIC*    CIPROFLOXACIN <=0.5 SENSITIVE Sensitive     ERYTHROMYCIN 0.5 SENSITIVE Sensitive     GENTAMICIN <=0.5 SENSITIVE Sensitive     OXACILLIN 0.5 SENSITIVE Sensitive     TETRACYCLINE <=1 SENSITIVE Sensitive     VANCOMYCIN 1 SENSITIVE Sensitive     TRIMETH/SULFA <=10 SENSITIVE Sensitive     CLINDAMYCIN <=0.25 SENSITIVE Sensitive     RIFAMPIN <=0.5 SENSITIVE Sensitive     Inducible Clindamycin NEGATIVE Sensitive     * STAPHYLOCOCCUS AUREUS  Blood Culture ID Panel (Reflexed)     Status: Abnormal   Collection Time: 05/28/22 10:41 AM  Result Value Ref Range Status   Enterococcus faecalis NOT DETECTED NOT DETECTED Final    Enterococcus Faecium NOT DETECTED NOT DETECTED Final   Listeria monocytogenes NOT DETECTED NOT DETECTED Final   Staphylococcus species DETECTED (Andriea Hasegawa) NOT  DETECTED Final    Comment: CRITICAL RESULT CALLED TO, READ BACK BY AND VERIFIED WITH: PHARMD C DAVID [id number] AT 1406 BY CM    Staphylococcus aureus (BCID) DETECTED (Jamiesha Victoria) NOT DETECTED Final    Comment: CRITICAL RESULT CALLED TO, READ BACK BY AND VERIFIED WITH: PHARMD C DAVIS [id number] AT 1406 BY CM    Staphylococcus epidermidis NOT DETECTED NOT DETECTED Final   Staphylococcus lugdunensis NOT DETECTED NOT DETECTED Final   Streptococcus species NOT DETECTED NOT DETECTED Final   Streptococcus agalactiae NOT DETECTED NOT DETECTED Final   Streptococcus pneumoniae NOT DETECTED NOT DETECTED Final   Streptococcus pyogenes NOT DETECTED NOT DETECTED Final   Tanisia Yokley.calcoaceticus-baumannii NOT DETECTED NOT DETECTED Final   Bacteroides fragilis NOT DETECTED NOT DETECTED Final   Enterobacterales NOT DETECTED NOT DETECTED Final   Enterobacter cloacae complex NOT DETECTED NOT DETECTED Final   Escherichia coli NOT DETECTED NOT DETECTED Final   Klebsiella aerogenes NOT DETECTED NOT DETECTED Final   Klebsiella oxytoca NOT DETECTED NOT DETECTED Final   Klebsiella pneumoniae NOT DETECTED NOT DETECTED Final   Proteus species NOT DETECTED NOT DETECTED Final   Salmonella species NOT DETECTED NOT DETECTED Final   Serratia marcescens NOT DETECTED NOT DETECTED Final   Haemophilus influenzae NOT DETECTED NOT DETECTED Final   Neisseria meningitidis NOT DETECTED NOT DETECTED Final   Pseudomonas aeruginosa NOT DETECTED NOT DETECTED Final   Stenotrophomonas maltophilia NOT DETECTED NOT DETECTED Final   Candida albicans NOT DETECTED NOT DETECTED Final   Candida auris NOT DETECTED NOT DETECTED Final   Candida glabrata NOT DETECTED NOT DETECTED Final   Candida krusei NOT DETECTED NOT DETECTED Final   Candida parapsilosis NOT DETECTED NOT DETECTED Final   Candida tropicalis NOT  DETECTED NOT DETECTED Final   Cryptococcus neoformans/gattii NOT DETECTED NOT DETECTED Final   Meth resistant mecA/C and MREJ NOT DETECTED NOT DETECTED Final    Comment: Performed at [facility], [address], [place], [place] [address]  Culture, blood (Routine X 2) w Reflex to ID Panel     Status: None   Collection Time: 05/30/22  6:21 AM   Specimen: BLOOD  Result Value Ref Range Status   Specimen Description BLOOD RIGHT ANTECUBITAL  Final   Special Requests   Final    BOTTLES DRAWN AEROBIC AND ANAEROBIC Blood Culture adequate volume   Culture   Final    NO GROWTH 5 DAYS Performed at [facility], [place] [address] [place], [place] [address]    Report Status 06/04/2022 FINAL  Final  Culture, blood (Routine X 2) w Reflex to ID Panel     Status: None   Collection Time: 05/30/22  6:26 AM   Specimen: BLOOD RIGHT HAND  Result Value Ref Range Status   Specimen Description BLOOD RIGHT HAND  Final   Special Requests   Final    BOTTLES DRAWN AEROBIC AND ANAEROBIC Blood Culture adequate volume   Culture   Final    NO GROWTH 5 DAYS Performed at [facility], [place] [address] [place], [place] [address]    Report Status 06/04/2022 FINAL  Final         Radiology Studies: ECHO TEE  Result Date: 06/05/2022    TRANSESOPHOGEAL ECHO REPORT   Patient Name:   LULIA SCHRINER Date of Exam: 06/05/2022 Medical Rec #:  [id number]            Height:       67.0 in Accession #:  [id number]           Weight:       162.0 lb Date of Birth:  17-Mar-1951             BSA:          1.849 m Patient Age:    28 years             BP:           135/63 mmHg Patient Gender: F                    HR:           58 bpm. Exam Location:  Inpatient Procedure: Cardiac Doppler, Color Doppler and Transesophageal Echo Indications:    MSSA bacteremia, rule out endocarditis  History:        Patient has prior history of Echocardiogram examinations.                 Signs/Symptoms:Bacteremia. MSSA bacteremia.   Sonographer:    Eartha Inch Referring Phys: [id number] Little Ishikawa PROCEDURE: The transesophogeal probe was passed without difficulty through the esophogus of the patient. Imaged were obtained with the patient in Hamdan Toscano supine position. Sedation performed by different physician. The patient developed no complications during the procedure. IMPRESSIONS  1. Left ventricular ejection fraction, by estimation, is 60 to 65%. The left ventricle has normal function.  2. Right ventricular systolic function is normal. The right ventricular size is normal.  3. Left atrial size was severely dilated. No left atrial/left atrial appendage thrombus was detected.  4. The mitral valve is grossly normal. Mild mitral valve regurgitation.  5. The aortic valve is tricuspid. Aortic valve regurgitation is not visualized.  6. Agitated saline contrast bubble study was negative, with no evidence of any interatrial shunt. Conclusion(s)/Recommendation(s): No evidence of vegetation/infective endocarditis on this transesophageael echocardiogram. FINDINGS  Left Ventricle: Left ventricular ejection fraction, by estimation, is 60 to 65%. The left ventricle has normal function. The left ventricular internal cavity size was normal in size. There is no left ventricular hypertrophy. Right Ventricle: The right ventricular size is normal. No increase in right ventricular wall thickness. Right ventricular systolic function is normal. Left Atrium: Left atrial size was severely dilated. No left atrial/left atrial appendage thrombus was detected. Right Atrium: Right atrial size was normal in size. Pericardium: There is no evidence of pericardial effusion. Mitral Valve: The mitral valve is grossly normal. Mild mitral valve regurgitation. Tricuspid Valve: The tricuspid valve is grossly normal. Tricuspid valve regurgitation is trivial. Aortic Valve: The aortic valve is tricuspid. Aortic valve regurgitation is not visualized. Pulmonic Valve: The pulmonic valve  was grossly normal. Pulmonic valve regurgitation is trivial. Aorta: The aortic root and ascending aorta are structurally normal, with no evidence of dilitation. IAS/Shunts: No atrial level shunt detected by color flow Doppler. Agitated saline contrast was given intravenously to evaluate for intracardiac shunting. Agitated saline contrast bubble study was negative, with no evidence of any interatrial shunt. Lyman Bishop MD Electronically signed by Lyman Bishop MD Signature Date/Time: 06/05/2022/2:43:55 PM    Final    DG CHEST PORT 1 VIEW  Result Date: 06/05/2022 CLINICAL DATA:  71 year old female with shortness of breath. EXAM: PORTABLE CHEST - 1 VIEW COMPARISON:  05/27/2022 FINDINGS: The mediastinal contours are within normal limits. No cardiomegaly. The lungs are clear bilaterally without evidence of focal consolidation, pleural effusion, or pneumothorax. No acute osseous abnormality. IMPRESSION: No acute cardiopulmonary process. Electronically Signed   By:  Ruthann Cancer M.D.   On: 06/05/2022 10:33    Scheduled Meds:  amLODipine  5 mg Oral BID   carvedilol  12.5 mg Oral BID WC   enoxaparin (LOVENOX) injection  40 mg Subcutaneous Q24H   irbesartan  300 mg Oral Daily   lidocaine  15 mL Oral Once   sodium chloride flush  3 mL Intravenous Q12H   trihexyphenidyl  2 mg Oral TID WC   Continuous Infusions:   ceFAZolin (ANCEF) IV 2 g (06/05/22 1504)     LOS: 8 days   Time spent: 70mn  Caldwell Powell, DO [facility]  If 7PM-7AM, please contact night-coverage [email or web address]  06/05/2022, 3:19 PM

## 2022-06-07 NOTE — Progress Notes (Signed)
PT Cancellation Note  Patient Details Name: Kristin Gilmore MRN: [id number] DOB: [birth year]   Cancelled Treatment:    Reason Eval/Treat Not Completed: Patient at procedure or test/unavailable. Will plan to follow-up another day as able.   Moishe Spice, PT, DPT Acute Rehabilitation Services  Office: [place] 06/07/2022, 4:29 PM

## 2022-06-08 DIAGNOSIS — R7881 Bacteremia: Secondary | ICD-10-CM | POA: Diagnosis not present

## 2022-06-08 DIAGNOSIS — B9561 Methicillin susceptible Staphylococcus aureus infection as the cause of diseases classified elsewhere: Secondary | ICD-10-CM | POA: Diagnosis not present

## 2022-06-08 LAB — CBC WITH DIFFERENTIAL/PLATELET
Abs Immature Granulocytes: 0.09 10*3/uL — ABNORMAL HIGH (ref 0.00–0.07)
Basophils Absolute: 0.1 10*3/uL (ref 0.0–0.1)
Basophils Relative: 1 %
Eosinophils Absolute: 0.1 10*3/uL (ref 0.0–0.5)
Eosinophils Relative: 1 %
HCT: 35.7 % — ABNORMAL LOW (ref 36.0–46.0)
Hemoglobin: 11.8 g/dL — ABNORMAL LOW (ref 12.0–15.0)
Immature Granulocytes: 1 %
Lymphocytes Relative: 25 %
Lymphs Abs: 2.1 10*3/uL (ref 0.7–4.0)
MCH: 31.1 pg (ref 26.0–34.0)
MCHC: 33.1 g/dL (ref 30.0–36.0)
MCV: 93.9 fL (ref 80.0–100.0)
Monocytes Absolute: 0.8 10*3/uL (ref 0.1–1.0)
Monocytes Relative: 9 %
Neutro Abs: 5.3 10*3/uL (ref 1.7–7.7)
Neutrophils Relative %: 63 %
Platelets: 441 10*3/uL — ABNORMAL HIGH (ref 150–400)
RBC: 3.8 MIL/uL — ABNORMAL LOW (ref 3.87–5.11)
RDW: 12.5 % (ref 11.5–15.5)
WBC: 8.4 10*3/uL (ref 4.0–10.5)
nRBC: 0 % (ref 0.0–0.2)

## 2022-06-08 LAB — COMPREHENSIVE METABOLIC PANEL
ALT: 8 U/L (ref 0–44)
AST: 22 U/L (ref 15–41)
Albumin: 3.4 g/dL — ABNORMAL LOW (ref 3.5–5.0)
Alkaline Phosphatase: 99 U/L (ref 38–126)
Anion gap: 11 (ref 5–15)
BUN: 26 mg/dL — ABNORMAL HIGH (ref 8–23)
CO2: 25 mmol/L (ref 22–32)
Calcium: 9.5 mg/dL (ref 8.9–10.3)
Chloride: 97 mmol/L — ABNORMAL LOW (ref 98–111)
Creatinine, Ser: 1 mg/dL (ref 0.44–1.00)
GFR, Estimated: 60 mL/min — ABNORMAL LOW (ref >60)
Glucose, Bld: 132 mg/dL — ABNORMAL HIGH (ref 70–99)
Potassium: 4.7 mmol/L (ref 3.5–5.1)
Sodium: 133 mmol/L — ABNORMAL LOW (ref 135–145)
Total Bilirubin: 0.5 mg/dL (ref 0.3–1.2)
Total Protein: 7 g/dL (ref 6.5–8.1)

## 2022-06-08 LAB — MAGNESIUM: Magnesium: 2.1 mg/dL (ref 1.7–2.4)

## 2022-06-08 LAB — PHOSPHORUS: Phosphorus: 4.1 mg/dL (ref 2.5–4.6)

## 2022-06-08 MED ORDER — CEFAZOLIN IV (FOR PTA / DISCHARGE USE ONLY): 2.0000 g | Freq: Three times a day (TID) | INTRAVENOUS | 0 refills | Status: AC

## 2022-06-08 MED ORDER — AMITRIPTYLINE HCL 10 MG PO TABS: 10.0000 mg | ORAL_TABLET | Freq: Every evening | ORAL | 1 refills | Status: DC | PRN

## 2022-06-08 MED ORDER — ALPRAZOLAM 0.25 MG PO TABS: 0.2500 mg | ORAL_TABLET | Freq: Two times a day (BID) | ORAL | 0 refills | Status: AC | PRN

## 2022-06-08 MED ORDER — HYDROMORPHONE HCL 2 MG PO TABS
2.0000 mg | ORAL_TABLET | ORAL | 0 refills | Status: AC | PRN
Start: 2022-06-08 — End: 2022-06-11

## 2022-06-08 MED ORDER — QUETIAPINE FUMARATE 300 MG PO TABS: 150.0000 mg | ORAL_TABLET | Freq: Every evening | ORAL | 1 refills | Status: DC | PRN

## 2022-06-08 MED ORDER — DICYCLOMINE HCL 10 MG PO CAPS: 10.0000 mg | ORAL_CAPSULE | Freq: Three times a day (TID) | ORAL | Status: DC | PRN

## 2022-06-08 NOTE — Progress Notes (Signed)
Report called and given to facility nurse, all questions answered. Pt left via ptar this pm

## 2022-06-08 NOTE — Progress Notes (Signed)
Attempted to call report twice to receiving facility, calls unanswered

## 2022-06-08 NOTE — Discharge Summary (Addendum)
Physician Discharge Summary  Kristin Gilmore VZC:[id number] DOB: 27-Sep-1951 DOA: 05/27/2022  PCP: [facility]  Admit date: 05/27/2022 Discharge date: 06/08/2022  Time spent: 40 minutes  Recommendations for Outpatient Follow-up:  CBC/CMP  Follow with ID outpatient, complete abx per ID recs Follow with orthopedics outpatient, follow back pain outpatient  Follow endometrial thickening outpatient, needs pelvic ultrasound outpatient Follow intramuscular lipoma outpatient per ortho Follow meds - at risk for polypharmacy     Discharge Diagnoses:  Principal Problem:   Bacteremia due to methicillin susceptible Staphylococcus aureus (MSSA) Active Problems:   Intractable back pain   SIRS (systemic inflammatory response syndrome) (HCC)   Lumbar spinal stenosis   Parkinson's disease ([place])   Bipolar disorder, in partial remission, most recent episode hypomanic ([place])   Hyperkalemia   Dehydration   Essential hypertension   Lumbar radicular pain   AKI (acute kidney injury) ([place])   Bursitis of right elbow   Discharge Condition: stable  Diet recommendation: heart healthy  Filed Weights   05/27/22 0757 05/30/22 0806  Weight: 72.6 kg 73.5 kg    History of present illness:  Kristin Gilmore 71 y.o. female with medical history significant of hypertension, dyslipidemia, prediabetes, anxiety, depression who presents with complaints of worsening back pain, denies trauma. Patient reports having severe pain in her lower back with radiation down the left leg. Sees Dr. Louanne Skye and has issues with chronic pain in this area.  They were trying to arrange for her to have an outpatient nerve block, but have been waiting on insurance approval.  She reports that she last had surgery of her lumbar spine back in January of this year.  She denies having any significant fevers, chills, saddle anesthesia, or  incontinence.  She was found to have fever and positive blood  cultures with MSSA bacteremia.  Suspected due to septic bursitis of R elbow.  ID and orthopedics following.  Plan for IV abx with ancef.  See below for additional details    Hospital Course:  Assessment and Plan: Sepsis secondary to MSSA bacteremia, POA Unclear source, possibly R olecranon septic bursitis  Blood cultures positive for MSSA, pansensitive.   ID following  Continue cefazolin as recommended by ID.  Plan to continue until 06/27/22.  OPAT orders per ID. -ESR 41, CRP 32, lactic acid normal, INR normal -TTE performed previously although patient became somewhat combative and the test was not completed, cardiology able to read what imaging was taken without any overt findings of valvular abnormalities. -CT of left elbow 06/02/2022 showed olecranon bursitis with bursal distension measuring 3.1 x 0.9 x 3.2 cm. Mild adjacent soft tissue swelling.  -MRI lumbar spine without evidence infection Also has significant RLE knee pain (has hx right knee replacement) -> though this has resolved, unclear significance, but low suspicion for septic joint (discussed with orthopedics) Appreciate ortho recs - recommending medical management with antibiotics for olecranon bursitis TEE without evidence of vegetation/infective endocarditis   Essential hypertension BP stable Continue home oral antihypertensives Continue to monitor vital signs.   Chronic anxiety and depression -Currently on Xanax only -Current unconfirmed medications listed include amitriptyline, Seroquel, Effexor -> she notes she takes elavil and seroquel as needed, no longer taking effexor    Chest pain Unclear cause, now resolved.  She notes she has this intermittently, is not interested in working this up further. She refused EKG ordered to w/u.  CXR without acute cardiopulm abnormality. TEE without noted wall motion abnormality Resolved  Prediabetes Last hemoglobin A1c 5.8 on 06/22/2021.   No longer on any meds   Parkinson's  disease Continue Home medication regimen includes Artane 2 mg 3 times daily.   Lower back pain; acute on chronic, chronic history of lumbar stenosis with radiculopathy, POA - Acute on chronic pain without deficits or paresthesias. - Initial plan for Gilmore evaluation and potential nerve block however this appears to have been done previously this month with minimal to no improvement, as such will not be repeated given limited therapeutics but increased risk of complications. -MRI without overt findings to explain worsening back pain - discussed with ortho -Continue home pain meds including p.o. Dilaudid - appears to be moderately well controlled at this time, not pain-free but again near her baseline. -Dr. Louanne Skye orthopedics following, appreciate insight and recommendations - likely require outpatient follow up given no indication for procedure/intervention at this time.  Dr. 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 Left ventricular ejection fraction, by estimation, is 60 to 65%. The  left ventricle has normal function.   2. Right ventricular systolic function is normal. The right ventricular  size is normal.   3. Left atrial size was severely dilated. No left atrial/left atrial  appendage thrombus was detected.   4. The mitral valve is grossly normal. Mild mitral valve regurgitation.   5. The aortic valve is tricuspid. Aortic valve regurgitation is not  visualized.   6. Agitated saline contrast bubble study was negative, with no evidence  of any interatrial shunt.   Conclusion(s)/Recommendation(s): No evidence of vegetation/infective  endocarditis on this transesophageael echocardiogram.   Echo IMPRESSIONS     1. Study was not completed as the patient became combative.   2. Left ventricular ejection  fraction, by estimation, is 60 to 65%. The  left ventricle has normal function. The left ventricle has no regional  wall motion abnormalities. Left ventricular diastolic function could not  be evaluated.   3. Right ventricular systolic function is normal. The right ventricular  size is normal.   4. Left atrial size was severely dilated.   5. The mitral valve is normal in structure. Mild mitral valve  regurgitation. No evidence of mitral stenosis.   6. The aortic valve is tricuspid. Aortic valve regurgitation is not  visualized. No aortic stenosis is present.   Consultations: Orthopedics Infectious disease cardiology  Discharge Exam: Vitals:   06/07/22 2135 06/08/22 0357  BP: (!) 133/57   Pulse: (!) 54 74  Resp: 17 20  Temp: 98.3 F (36.8 C) 98.4 F (36.9 C)  SpO2: 98% 97%   Eager to discharge Discussed d/c plan C/o chronic abdominal discomfort  General: No acute distress. Cardiovascular: RRR Lungs: unlabored Abdomen: Soft, nontender, nondistended Neurological: Alert and oriented 3. Moves all extremities 4 . Cranial nerves II through XII grossly intact.  Constant shaking.  Extremities: No clubbing or cyanosis. No edema.   Discharge Instructions   Discharge Instructions     Call Kristin Gilmore for:  difficulty breathing, headache or visual disturbances   Complete by: As directed    Call Kristin Gilmore for:  extreme fatigue   Complete by: As directed    Call Kristin Gilmore for:  hives   Complete by: As directed    Call Kristin Gilmore for:  persistant dizziness or light-headedness   Complete by: As directed  Call Kristin Gilmore for:  persistant nausea and vomiting   Complete by: As directed    Call Kristin Gilmore for:  redness, tenderness, or signs of infection (pain, swelling, redness, odor or green/yellow discharge around incision site)   Complete by: As directed    Call Kristin Gilmore for:  severe uncontrolled pain   Complete by: As directed    Call Kristin Gilmore for:  temperature >100.4   Complete by: As directed    Diet - low sodium heart  healthy   Complete by: As directed    Discharge instructions   Complete by: As directed    You were seen for an infection in your blood as well as an infected elbow bursa.  You've improved on IV antibiotics.  You had Kristin Gilmore TEE and there was no evidence of endocarditis (heart valve infection).  We'll plan to continue your IV antibiotics until 06/27/2022.  You should follow up with infectious disease and orthopedics outpatient.  Please follow up with orthopedics and your pain doctors outpatient for continued management of your back pain.  You need Kristin Gilmore pelvic ultrasound as an outpatient.   Return for new, recurrent, or worsening symptoms.  Please ask your PCP to request records from this hospitalization so they know what was done and what the next steps will be.   Home infusion instructions   Complete by: As directed    Instructions: Flushing of vascular access device: 0.9% NaCl pre/post medication administration and prn patency; Heparin 100 u/ml, 95m for implanted ports and Heparin 10u/ml, 557mfor all other central venous catheters.   Increase activity slowly   Complete by: As directed       Allergies as of 06/08/2022       Reactions   Levofloxacin Other (See Comments)    Dizziness (intolerance)   Amoxicillin Hives   UNSPECIFIED REACTION  Has patient had Takeesha Isley PCN reaction causing immediate rash, facial/tongue/throat swelling, SOB or lightheadedness with hypotension: Unknown Has patient had Jackie Russman PCN reaction causing severe rash involving mucus membranes or skin necrosis: Unknown Has patient had Blessing Zaucha PCN reaction that required hospitalization: Unknown Has patient had Meeya Goldin PCN reaction occurring within the last 10 years: No If all of the above answers are "NO", then may proceed with Cephalosporin use. Sweats    Clarithromycin Hives, Other (See Comments), Rash   Atropine Hives   Fish Allergy Other (See Comments)   Childhood reaction. Pt denies further reaction.        Medication List     STOP  taking these medications    Allantoin 0.5 % Gel   furosemide 20 MG tablet Commonly known as: LASIX   glimepiride 2 MG tablet Commonly known as: AMARYL   HYDROcodone-acetaminophen 5-325 MG tablet Commonly known as: Norco   oxyCODONE 20 mg 12 hr tablet Commonly known as: OXYCONTIN   sulfamethoxazole-trimethoprim 800-160 MG tablet Commonly known as: BACTRIM DS   venlafaxine XR 75 MG 24 hr capsule Commonly known as: EFFEXOR-XR       TAKE these medications    ALPRAZolam 0.25 MG tablet Commonly known as: XANAX Take 1 tablet (0.25 mg total) by mouth 2 (two) times daily as needed for up to 3 days for anxiety.   amitriptyline 10 MG tablet Commonly known as: ELAVIL Take 1 tablet (10 mg total) by mouth at bedtime as needed for sleep. What changed:  how much to take when to take this reasons to take this   amLODipine 5 MG tablet Commonly known as: NORVASC Take 5 mg by mouth  2 (two) times daily.   aspirin EC 81 MG tablet Take 1 tablet (81 mg total) by mouth 2 (two) times daily. What changed: when to take this   Biofreeze 4 % Gel Generic drug: Menthol (Topical Analgesic) Apply 1 application topically 4 (four) times daily as needed (pain).   Biotin 5000 MCG Caps Take 5,000 mcg by mouth every morning.   bisacodyl 5 MG EC tablet Commonly known as: DULCOLAX Take 5-15 mg by mouth daily as needed (constipation).   CALCIUM CITRATE + D PO Take 1-2 capsules by mouth See admin instructions. Calcium 200 mg, vitamin D3 2.5 mcg - 100 units - take 1 tablet by mouth with breakfast and 2 tablets with supper   carvedilol 25 MG tablet Commonly known as: COREG Take 12.5 mg by mouth 2 (two) times daily with Shanekia Latella meal. What changed: Another medication with the same name was removed. Continue taking this medication, and follow the directions you see here.   ceFAZolin  IVPB Commonly known as: ANCEF Inject 2 g into the vein every 8 (eight) hours for 20 days. Indication:  MSSA  bacteremia/septic bursitis First Dose: Yes Last Day of Therapy:  06/27/22 Labs - Once weekly:  CBC/D and BMP, Labs - Every other week:  ESR and CRP   CoQ10 100 MG Caps Take 100 mg by mouth daily with supper.   dicyclomine 10 MG capsule Commonly known as: BENTYL Take 1 capsule (10 mg total) by mouth 3 (three) times daily as needed for spasms.   docusate sodium 100 MG capsule Commonly known as: COLACE Take 300 mg by mouth at bedtime as needed (constipation).   ferrous sulfate 325 (65 FE) MG EC tablet Take 325 mg by mouth See admin instructions. Take one tablet (325) mg) by mouth every afternoon on an empty stomach   Fish Oil 1000 MG Caps Take 1,000 mg by mouth 2 (two) times daily.   Glucosamine-Chondroitin 500-400 MG Caps Take 1-2 capsules by mouth See admin instructions. Take one capsule by mouth every morning, and two capsules at night   HYDROmorphone 2 MG tablet Commonly known as: Dilaudid Take 1 tablet (2 mg total) by mouth every 4 (four) hours as needed for up to 3 days for severe pain.   ibuprofen 200 MG tablet Commonly known as: ADVIL Take 400 mg by mouth daily as needed for headache.   LACTINEX PO Take 1 tablet by mouth daily.   loperamide 2 MG capsule Commonly known as: IMODIUM Take 2 mg by mouth 4 (four) times daily as needed for diarrhea or loose stools.   MAGNESIUM CITRATE PO Take 150 mg by mouth at bedtime.   Magnesium Oxide -Mg Supplement 500 MG Tabs Take 500 mg by mouth at bedtime.   methocarbamol 500 MG tablet Commonly known as: ROBAXIN Take 1 tablet (500 mg total) by mouth every 8 (eight) hours as needed for muscle spasms.   mupirocin ointment 2 % Commonly known as: BACTROBAN Place 1 application into the nose 3 (three) times daily.   OVER THE COUNTER MEDICATION Take 1 capsule by mouth 2 (two) times daily. [place] brain blood vessel relaxation (butterburr root extract)   OVER THE COUNTER MEDICATION Apply 1 application topically daily.  Leg & back pain relief cream   pantoprazole 40 MG tablet Commonly known as: PROTONIX Take 40 mg by mouth 2 (two) times daily.   potassium chloride 10 MEQ tablet Commonly known as: KLOR-CON Take 10 mEq by mouth daily.   PROBIOTIC PO Take  1 capsule by mouth daily with breakfast. SUPREMA DOPHILUS 5 BILLION CFU   QUEtiapine 300 MG tablet Commonly known as: SEROQUEL Take 0.5 tablets (150 mg total) by mouth at bedtime as needed. What changed: See the new instructions.   Red Yeast Rice 600 MG Caps Take 1,200 mg by mouth 2 (two) times daily.   Simethicone 180 MG Caps Take 180 mg by mouth daily as needed (gas/bloating).   sodium chloride 0.65 % Soln nasal spray Commonly known as: OCEAN Place 1 spray into both nostrils 3 (three) times daily.   SUMAtriptan 100 MG tablet Commonly known as: IMITREX Take 1 tablet (100 mg total) by mouth 2 (two) times daily as needed for up to 28 days for migraine. May repeat in 2 hours if headache persists or recurs.   telmisartan 80 MG tablet Commonly known as: MICARDIS Take 80 mg by mouth at bedtime. What changed: Another medication with the same name was removed. Continue taking this medication, and follow the directions you see here.   tolterodine 4 MG 24 hr capsule Commonly known as: DETROL LA Take 4 mg by mouth daily with breakfast.   trihexyphenidyl 2 MG tablet Commonly known as: ARTANE Take 1 tablet (2 mg total) by mouth 3 (three) times daily with meals.   Turmeric 500 MG Caps Take 500 mg by mouth 2 (two) times daily with Isabele Lollar meal.   Vitamin D3 50 MCG (2000 UT) Tabs Take 4,000 Units by mouth daily with supper.               Home Infusion Instuctions  (From admission, onward)           Start     Ordered   06/08/22 0000  Home infusion instructions       Question:  Instructions  Answer:  Flushing of vascular access device: 0.9% NaCl pre/post medication administration and prn patency; Heparin 100 u/ml, 94m for implanted ports  and Heparin 10u/ml, 52mfor all other central venous catheters.   06/08/22 1013           Allergies  Allergen Reactions   Levofloxacin Other (See Comments)     Dizziness (intolerance)   Amoxicillin Hives    UNSPECIFIED REACTION  Has patient had Richrd Kuzniar PCN reaction causing immediate rash, facial/tongue/throat swelling, SOB or lightheadedness with hypotension: Unknown Has patient had Mekayla Soman PCN reaction causing severe rash involving mucus membranes or skin necrosis: Unknown Has patient had Mertie Haslem PCN reaction that required hospitalization: Unknown Has patient had Abdulah Iqbal PCN reaction occurring within the last 10 years: No If all of the above answers are "NO", then may proceed with Cephalosporin use. Sweats     Clarithromycin Hives, Other (See Comments) and Rash   Atropine Hives   Fish Allergy Other (See Comments)    Childhood reaction. Pt denies further reaction.    Follow-up Information     NiJessy OtoMD Follow up in 2 week(s).   Specialty: Orthopedic Surgery Contact information: [address] [phone] [facility] up.   Specialty: Family Medicine Contact information: [address] [phone]         CaConstance HawMD .   Specialty: Cardiology Contact information: [address] [place] [phone]       CaConstance HawMD .   Specialty: Cardiology Contact information: [address] [place] [address] [phone]         Kristin Pine, Kristin Gilmore Follow up.   Specialties: Infectious Diseases, Internal Medicine Why: appointment scheduled fo 06/19/22 at 4 PM Contact information: [place] [address] [phone]                  The results of significant diagnostics from this hospitalization (including imaging, microbiology, ancillary and laboratory) are listed below for  reference.    Significant Diagnostic Studies: [place] EKG SITE RITE  Result Date: 06/07/2022 If Site Rite image not attached, placement could not be confirmed due to current cardiac rhythm.  ECHO TEE  Result Date: 06/05/2022    TRANSESOPHOGEAL ECHO REPORT   Patient Name:   AMAREA MACDOWELL Date of Exam: 06/05/2022 Medical Rec #:  [id number]            Height:       67.0 in Accession #:    [id number]           Weight:       162.0 lb Date of Birth:  Dec 24, 1950             BSA:          1.849 m Patient Age:    60 years             BP:           135/63 mmHg Patient Gender: F                    HR:           58 bpm. Exam Location:  Inpatient Procedure: Cardiac Doppler, Color Doppler and Transesophageal Echo Indications:    MSSA bacteremia, rule out endocarditis  History:        Patient has prior history of Echocardiogram examinations.                 Signs/Symptoms:Bacteremia. MSSA bacteremia.  Sonographer:    Eartha Inch Referring Phys: [id number] Kristin Gilmore PROCEDURE: The transesophogeal probe was passed without difficulty through the esophogus of the patient. Imaged were obtained with the patient in Sabin Gibeault supine position. Sedation performed by different physician. The patient developed no complications during the procedure. IMPRESSIONS  1. Left ventricular ejection fraction, by estimation, is 60 to 65%. The left ventricle has normal function.  2. Right ventricular systolic function is normal. The right ventricular size is normal.  3. Left atrial size was severely dilated. No left atrial/left atrial appendage thrombus was detected.  4. The mitral valve is grossly normal. Mild mitral valve regurgitation.  5. The aortic valve is tricuspid. Aortic valve regurgitation is not visualized.  6. Agitated saline contrast bubble study was negative, with no evidence of any interatrial shunt. Conclusion(s)/Recommendation(s): No evidence of vegetation/infective endocarditis on this transesophageael echocardiogram. FINDINGS   Left Ventricle: Left ventricular ejection fraction, by estimation, is 60 to 65%. The left ventricle has normal function. The left ventricular internal cavity size was normal in size. There is no left ventricular hypertrophy. Right Ventricle: The right ventricular size is normal. No increase in right ventricular wall thickness. Right ventricular systolic function is normal. Left Atrium: Left atrial size was severely dilated. No left atrial/left atrial appendage thrombus was detected. Right Atrium: Right atrial size was normal in size. Pericardium: There is no evidence of pericardial effusion. Mitral Valve: The mitral valve is grossly normal. Mild mitral valve regurgitation. Tricuspid Valve: The tricuspid valve is  grossly normal. Tricuspid valve regurgitation is trivial. Aortic Valve: The aortic valve is tricuspid. Aortic valve regurgitation is not visualized. Pulmonic Valve: The pulmonic valve was grossly normal. Pulmonic valve regurgitation is trivial. Aorta: The aortic root and ascending aorta are structurally normal, with no evidence of dilitation. IAS/Shunts: No atrial level shunt detected by color flow Doppler. Agitated saline contrast was given intravenously to evaluate for intracardiac shunting. Agitated saline contrast bubble study was negative, with no evidence of any interatrial shunt. Kristin Bishop Kristin Gilmore Electronically signed by Kristin Bishop Kristin Gilmore Signature Date/Time: 06/05/2022/2:43:55 PM    Final    DG CHEST PORT 1 VIEW  Result Date: 06/05/2022 CLINICAL DATA:  71 year old female with shortness of breath. EXAM: PORTABLE CHEST - 1 VIEW COMPARISON:  05/27/2022 FINDINGS: The mediastinal contours are within normal limits. No cardiomegaly. The lungs are clear bilaterally without evidence of focal consolidation, pleural effusion, or pneumothorax. No acute osseous abnormality. IMPRESSION: No acute cardiopulmonary process. Electronically Signed   By: Ruthann Cancer M.D.   On: 06/05/2022 10:33   CT EBLOW RIGHT W  CONTRAST  Result Date: 06/02/2022 CLINICAL DATA:  bacteremia, unclear source EXAM: CT OF THE UPPER RIGHT EXTREMITY WITH CONTRAST TECHNIQUE: Multidetector CT imaging of the upper right extremity was performed according to the standard protocol following intravenous contrast administration. RADIATION DOSE REDUCTION: This exam was performed according to the departmental dose-optimization program which includes automated exposure control, adjustment of the mA and/or kV according to patient size and/or use of iterative reconstruction technique. CONTRAST:  176m OMNIPAQUE IOHEXOL 300 MG/ML  SOLN COMPARISON:  Radiograph 05/31/2022 FINDINGS: Bones/Joint/Cartilage There is no evidence of acute fracture. Alignment is normal. No joint effusion. Mild ulnar trochlear osteoarthritis. Ligaments Suboptimally assessed by CT. Muscles and Tendons No acute myotendinous abnormality by CT. There is large intramuscular lipoma of the triceps which is partially visualized. Soft tissues There is distension of the olecranon bursa measuring up to 3.1 x 0.9 x 3.2 cm. Mild adjacent soft tissue swelling. IMPRESSION: Olecranon bursitis with bursal distension measuring 3.1 x 0.9 x 3.2 cm. Mild adjacent soft tissue swelling. No acute osseous abnormality or joint effusion. Partially visualized large intramuscular lipoma in the triceps. Electronically Signed   By: JMaurine SimmeringM.D.   On: 06/02/2022 15:01   DG Humerus Right  Addendum Date: 05/31/2022   ADDENDUM REPORT: 05/31/2022 10:24 ADDENDUM: There is lucency in the soft tissues over the distal portion of the humeral diaphysis measuring 8.4 cm x 3.6 cm suspicious for Aniceto Kyser lipoma or other fatty mass. Further evaluation with CT is recommended. Electronically Signed   By: PValetta MoleM.D.   On: 05/31/2022 10:24   Result Date: 05/31/2022 CLINICAL DATA:  Pain of right mid humerus EXAM: RIGHT HUMERUS - 2+ VIEW COMPARISON:  None Available. FINDINGS: There is no acute fracture or dislocation. Bony  alignment is normal. There are amorphous calcifications about the greater tuberosity which may reflect calcific tendinitis. The soft tissues are otherwise unremarkable. IMPRESSION: 1. No acute fracture or dislocation. 2. Calcific densities adjacent to the greater tuberosity may reflect calcific tendinitis. Electronically Signed: By: PValetta MoleM.D. On: 05/31/2022 10:07   DG HIP UNILAT WITH PELVIS 2-3 VIEWS LEFT  Result Date: 05/31/2022 CLINICAL DATA:  Chronic left groin and hip pain. EXAM: DG HIP (WITH OR WITHOUT PELVIS) 2-3V LEFT [facility]  [facility] FINDINGS: No evidence for an acute fracture. No subluxation or dislocation. Specifically, no evidence for left pubic ramus fracture or left  femoral neck fracture. IMPRESSION: Stable.  No acute bony abnormality. Electronically Signed   By: Misty Stanley M.D.   On: 05/31/2022 10:08   DG Elbow 2 Views Right  Result Date: 05/31/2022 CLINICAL DATA:  Pain. EXAM: RIGHT ELBOW - 2 VIEW COMPARISON:  None available FINDINGS: No elbow joint effusion. Minimal enthesopathic spurring at the triceps insertion on the olecranon. Mild chronic enthesopathic change at the common flexor tendon origin at the lateral epicondyle. No acute fracture is seen. No dislocation. IMPRESSION: No acute fracture is seen. Mild chronic enthesopathic change at the common flexor tendon origin at the lateral epicondyle. Electronically Signed   By: Yvonne Kendall M.D.   On: 05/31/2022 10:05   ECHOCARDIOGRAM COMPLETE  Result Date: 05/30/2022    ECHOCARDIOGRAM REPORT   Patient Name:   GARNELL BEGEMAN Date of Exam: 05/30/2022 Medical Rec #:  [id number]            Height:       67.0 in Accession #:    [id number]           Weight:       162.0 lb Date of Birth:  December 02, 1950             BSA:          1.849 m Patient Age:    33 years             BP:           124/88 mmHg Patient Gender: F                    HR:           84 bpm. Exam Location:   Inpatient Procedure: 2D Echo, Color Doppler and Cardiac Doppler Indications:    Bacteremia  History:        Patient has prior history of Echocardiogram examinations, most                 recent 11/23/2015. Risk Factors:Hypertension and Dyslipidemia.  Sonographer:    Memory Argue Referring Phys: [id number] Reeves  1. Study was not completed as the patient became combative.  2. Left ventricular ejection fraction, by estimation, is 60 to 65%. The left ventricle has normal function. The left ventricle has no regional wall motion abnormalities. Left ventricular diastolic function could not be evaluated.  3. Right ventricular systolic function is normal. The right ventricular size is normal.  4. Left atrial size was severely dilated.  5. The mitral valve is normal in structure. Mild mitral valve regurgitation. No evidence of mitral stenosis.  6. The aortic valve is tricuspid. Aortic valve regurgitation is not visualized. No aortic stenosis is present. FINDINGS  Left Ventricle: Left ventricular ejection fraction, by estimation, is 60 to 65%. The left ventricle has normal function. The left ventricle has no regional wall motion abnormalities. The left ventricular internal cavity size was normal in size. There is  no left ventricular hypertrophy. Left ventricular diastolic function could not be evaluated. Right Ventricle: The right ventricular size is normal. No increase in right ventricular wall thickness. Right ventricular systolic function is normal. Left Atrium: Left atrial size was severely dilated. Right Atrium: Right atrial size was normal in size. Pericardium: There is no evidence of pericardial effusion. Mitral Valve: The mitral valve is normal in structure. Mild mitral valve regurgitation. No evidence of mitral valve stenosis. Tricuspid Valve: The tricuspid valve is normal in structure. Tricuspid valve regurgitation is not demonstrated.  No evidence of tricuspid stenosis. Aortic Valve: The aortic  valve is tricuspid. Aortic valve regurgitation is not visualized. No aortic stenosis is present. Pulmonic Valve: The pulmonic valve was normal in structure. Pulmonic valve regurgitation is not visualized. No evidence of pulmonic stenosis. Aorta: The aortic root is normal in size and structure. Venous: The inferior vena cava was not well visualized. IAS/Shunts: No atrial level shunt detected by color flow Doppler.  LEFT VENTRICLE PLAX 2D LVIDd:         5.40 cm LVIDs:         3.60 cm LV PW:         0.90 cm LV IVS:        0.80 cm LVOT diam:     2.00 cm LVOT Area:     3.14 cm  LEFT ATRIUM           Index LA diam:      3.90 cm 2.11 cm/m LA Vol (A4C): 77.5 ml 41.91 ml/m   AORTA Ao Root diam: 2.60 cm MITRAL VALVE MV Area (PHT): 4.21 cm     SHUNTS MV Decel Time: 180 msec     Systemic Diam: 2.00 cm MV E velocity: 124.00 cm/s MV Moiz Ryant velocity: 104.00 cm/s MV E/Khailee Mick ratio:  1.19 Skeet Latch Kristin Gilmore Electronically signed by Skeet Latch Kristin Gilmore Signature Date/Time: 05/30/2022/7:26:46 PM    Final    MR Lumbar Spine W Wo Contrast  Result Date: 05/30/2022 CLINICAL DATA:  Low back pain with bilateral leg pain and weakness, history of posterior fusion at L4-L5 10/30/2021. EXAM: MRI LUMBAR SPINE WITHOUT AND WITH CONTRAST TECHNIQUE: Multiplanar and multiecho pulse sequences of the lumbar spine were obtained without and with intravenous contrast. CONTRAST:  49m GADAVIST GADOBUTROL 1 MMOL/ML IV SOLN COMPARISON:  CT lumbar spine 04/22/2022, lumbar spine MRI 07/18/2021 FINDINGS: Segmentation: Standard; the lowest formed disc space is designated L5-S1. Alignment: Levocurvature centered at L2 is unchanged. Trace anterolisthesis of L3 on L4 and 5 mm anterolisthesis of L4 on L5 is unchanged. Vertebrae: Postsurgical changes reflecting posterior instrumented fusion at L3 through L5 with interbody spacers are again seen. Lucency around the L5 screws seen on the prior CT is not appreciated by MRI. Background marrow signal is within normal  limits. There is degenerative endplate marrow signal abnormality with edema but no enhancement at L2-L3 which is increased since the preoperative study from 2022. There is no other marrow edema. There is no abnormal marrow enhancement. Conus medullaris and cauda equina: Conus extends to the L1 level. Conus and cauda equina appear normal. There is no abnormal enhancement of the conus or cauda equina nerve roots. Paraspinal and other soft tissues: There are postsurgical changes in the soft tissues posterior to the surgical levels. There is no suspicious fluid collection. The endometrium is distended measuring up to 8 mm in thickness. Disc levels: T12-L1: No significant spinal canal or neural foraminal stenosis L1-L2: No significant spinal canal or neural foraminal stenosis. L2-L3: There is disc desiccation and narrowing with Jaxsen Bernhart diffuse disc bulge and small superiorly migrated extrusion, and moderate bilateral facet arthropathy resulting in moderate spinal canal stenosis with subarticular zone narrowing and possible irritation of either traversing L3 nerve root, and no significant neural foraminal stenosis. Compared to the preoperative study from 2022, the spinal canal stenosis has worsened. L3-L4: Status post posterior instrumented fusion and decompression. There is no residual spinal canal stenosis. There is mild residual right worse than left neural foraminal stenosis without convincing evidence of nerve root compression. L4-L5:  Status post posterior instrumented fusion and decompression. There is grade 1 anterolisthesis with uncovering of the disc posteriorly and bilateral facet arthropathy. There is no residual spinal canal stenosis; however, there is severe left and mild-to-moderate right neural foraminal stenosis with compression of the exiting left L4 nerve root (5-11). L5-S1: There is advanced disc desiccation and narrowing with Aleka Twitty disc bulge and central protrusion and annular fissure, moderate bilateral facet  arthropathy resulting in severe left worse than right neural foraminal stenosis without significant spinal canal stenosis. There is no evidence of compression of the traversing S1 nerve roots as was questioned on prior CT. IMPRESSION: 1. Status post posterior instrumented fusion and decompression at L3 through L5 without residual spinal canal stenosis at the surgical levels; however, there is severe left and mild-to-moderate right neural foraminal stenosis at L4-L5 with compression of the exiting left L4 nerve root. No other convincing nerve root compression at the surgical levels. 2. 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 3. Unchanged advanced disc degeneration and moderate facet arthropathy at L5-S1 with severe left worse than right neural foraminal stenosis. No evidence of compression of the traversing S1 nerve roots as was questioned on prior CT. 4. Distended endometrium measuring up to 8 mm. Recommend correlation with pelvic ultrasound given patient age. Electronically Signed   By: Valetta Mole M.D.   On: 05/30/2022 11:16   DG Chest Portable 1 View  Result Date: 05/27/2022 CLINICAL DATA:  Chest pain and fevers, initial encounter EXAM: PORTABLE CHEST 1 VIEW COMPARISON:  11/30/2018 FINDINGS: Cardiac shadow is stable. Lungs are well aerated bilaterally. No focal infiltrate or effusion is seen. Degenerative changes of the thoracic spine are noted. IMPRESSION: No active disease. Electronically Signed   By: Inez Catalina M.D.   On: 05/27/2022 20:32   NCV with EMG(electromyography)  Result Date: 05/16/2022 Kristin Berthold, Kristin Gilmore     05/16/2022  3:31 PM Kristin Gilmore [place], [place]  [place], [place] [address] Tel: [phone] Fax:  [phone] Test Date:  05/16/2022 Patient: Momoka Stringfield  DOB: Feb 18, 1951 Physician: Kristin Amber, Kristin Gilmore Sex: Female Height: _0  Ref Phys: [name], Kristin Gilmore ID#: [id number]   Technician:  Patient Complaints: This is Keondre Markson 71 year old female with history of lumbar surgery referred for evaluation of low back and radicular pain. NCV & EMG Findings: Extensive electrodiagnostic testing of the right lower extremity and additional studies of the left shows: Bilateral sural and superficial peroneal sensory responses are within normal limits. Right peroneal (EBD) and bilateral peroneal (TA) motor responses show reduced amplitude (R1.3, L2.5, R2.1 mV).  Left peroneal (EDB) bilateral tibial H reflex studies are within normal limits.  Bilateral tibial H reflex studies show prolonged latency.  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 Impression: Chronic L5 radiculopathy affecting bilateral lower extremities, moderate and worse on the right. Chronic S1 radiculopathy affecting right lower extremities, mild. There is no evidence of Glady Ouderkirk large fiber sensorimotor polyneuropathy. ___________________________ Kristin Amber, Kristin Gilmore Nerve Conduction Studies Anti Sensory Summary Table  Stim Site NR Peak (ms) Norm Peak (ms) P-T Amp (V) Norm P-T Amp Left Sup Peroneal Anti Sensory (Ant Lat Mall)  32C 12 cm    3.7 <4.6 4.7 >3 Right Sup Peroneal Anti Sensory (Ant Lat Mall)  32C 12 cm  3.0 <4.6 5.8 >3 Left Sural Anti Sensory (Lat Mall)  32C Calf    2.4 <4.6 3.6 >3 Right Sural Anti Sensory (Lat Mall)  32C Calf    2.8 <4.6 3.5 >3 Motor Summary Table  Stim Site NR Onset (ms) Norm Onset (ms) O-P Amp (mV) Norm O-P Amp Site1 Site2 Delta-0 (ms) Dist (cm) Vel (m/s) Norm Vel (m/s) Left Peroneal Motor (Ext Dig Brev)  32C Ankle    5.5 <6.0 3.1 >2.5 B Fib Ankle 8.3 33.0 40 >40 B Fib    13.8  2.5  Poplt B Fib 2.0 8.0 40 >40 Poplt    15.8  2.5        Right Peroneal Motor (Ext Dig Brev)  32C Ankle    4.2 <6.0 1.3 >2.5 B Fib Ankle 8.8 36.0  41 >40 B Fib    13.0  1.3  Poplt B Fib 1.8 8.0 44 >40 Poplt    14.8  1.2        Left Peroneal TA Motor (Tib Ant)  32C Fib Head    3.2 <4.5 2.5 >3 Poplit Fib Head 1.4 7.0 50 >40 Poplit    4.6  2.1        Right Peroneal TA Motor (Tib Ant)  32C Fib Head    3.1 <4.5 2.1 >3 Poplit Fib Head 1.7 8.0 47 >40 Poplit    4.8  1.8        Left Tibial Motor (Abd Hall Brev)  32C Ankle    5.0 <6.0 4.4 >4 Knee Ankle 10.1 43.0 43 >40 Knee    15.1  3.4        Right Tibial Motor (Abd Hall Brev)  32C Ankle    3.8 <6.0 5.7 >4 Knee Ankle 11.3 45.0 40 >40 Knee    15.1  3.0        H Reflex Studies  NR H-Lat (ms) Lat Norm (ms) L-R H-Lat (ms) Left Tibial (Gastroc)  32C    37.82 <35 1.50 Right Tibial (Gastroc)  32C    36.33 <35 1.50 EMG  Side Muscle Ins Act Fibs Psw Fasc Number Recrt Dur Dur. Amp Amp. Poly Poly. Comment Right AntTibialis Nml Nml Nml Nml 2- Rapid Many 1+ Many 1+ Many 1+ N/Analea Muller Right Gastroc Nml Nml Nml Nml 1- Rapid Some 1+ Some 1+ Some 1+ N/Agron Swiney Right Flex Dig Long Nml Nml Nml Nml 2- Rapid Many 1+ Many 1+ Many 1+ N/Maddyson Keil Right RectFemoris _0  _1  Nml Nml N/Tamecka Milham Right BicepsFemS Nml Nml Nml Nml 1- Rapid Some 1+ Some 1+ Some 1+ N/Jerzee Jerome Right GluteusMed Nml Nml Nml Nml 1- Rapid Some 1+ Some 1+ Some 1+ N/Zacharias Ridling Left BicepsFemS _2  _3  Nml Nml N/Shelden Raborn Left AntTibialis Nml Nml Nml Nml 2- Rapid Many 1+ Many 1+ Many 1+ N/Hermann Dottavio Left Gastroc Nml Nml Nml Nml 1- Rapid Some 1+ Some 1+ Some 1+ N/Meredith Kilbride Left RectFemoris _4  _5  Nml Nml N/Diana Armijo Left GluteusMed Nml Nml Nml Nml 1- Rapid Some 1+ Some 1+ Some 1+ N/Eugune Sine Left Flex Dig Long Nml Nml Nml Nml 2- Rapid Some 1+ Some 1+ Some 1+ N/Lakeena Downie Waveforms:                Microbiology: Recent Results (from the past 240 hour(s))  Culture, blood (Routine X 2) w Reflex to ID Panel     Status: None  Collection Time: 05/30/22  6:21 AM   Specimen: BLOOD  Result Value Ref Range Status   Specimen Description BLOOD RIGHT ANTECUBITAL  Final    Special Requests   Final    BOTTLES DRAWN AEROBIC AND ANAEROBIC Blood Culture adequate volume   Culture   Final    NO GROWTH 5 DAYS Performed at [facility], [address] [place], [place] [address]    Report Status 06/04/2022 FINAL  Final  Culture, blood (Routine X 2) w Reflex to ID Panel     Status: None   Collection Time: 05/30/22  6:26 AM   Specimen: BLOOD RIGHT HAND  Result Value Ref Range Status   Specimen Description BLOOD RIGHT HAND  Final   Special Requests   Final    BOTTLES DRAWN AEROBIC AND ANAEROBIC Blood Culture adequate volume   Culture   Final    NO GROWTH 5 DAYS Performed at [facility], [place] [address], [place], [place] [address]    Report Status 06/04/2022 FINAL  Final     Labs: Basic Metabolic Panel: Recent Labs  Lab 06/02/22 0649 06/04/22 0609 06/07/22 0652 06/08/22 0316  NA 134* 133* 135 133*  K 4.2 4.4 4.2 4.7  CL 96* 96* 96* 97*  CO2 _0 GLUCOSE 165* 160* 124* 132*  BUN 26* 40* 28* 26*  CREATININE 1.01* 1.22* 0.99 1.00  CALCIUM 8.9 8.4* 9.4 9.5  MG  --   --  2.0 2.1  PHOS  --   --  3.5 4.1   Liver Function Tests: Recent Labs  Lab 06/02/22 0649 06/04/22 0609 06/07/22 0652 06/08/22 0316  AST 13* _1 ALT _2 ALKPHOS 78 93 94 99  BILITOT 0.5 0.3 0.3 0.5  PROT 6.4* 5.7* 6.7 7.0  ALBUMIN 2.8* 2.6* 3.1* 3.4*   No results for input(s): "LIPASE", "AMYLASE" in the last 168 hours. No results for input(s): "AMMONIA" in the last 168 hours. CBC: Recent Labs  Lab 06/02/22 0649 06/04/22 0609 06/07/22 0652 06/08/22 0316  WBC 11.8* 8.1 7.2 8.4  NEUTROABS  --   --  4.6 5.3  HGB 12.2 10.9* 11.5* 11.8*  HCT 36.5 32.7* 34.8* 35.7*  MCV 91.9 92.9 94.1 93.9  PLT 312 325 430* 441*   Cardiac Enzymes: No results for input(s): "CKTOTAL", "CKMB", "CKMBINDEX", "TROPONINI" in the last 168 hours. BNP: BNP (last 3 results) Recent Labs    11/29/21 1615  BNP 59.2    ProBNP (last 3 results) No results for  input(s): "PROBNP" in the last 8760 hours.  CBG: No results for input(s): "GLUCAP" in the last 168 hours.     Signed:  Fayrene Helper Kristin Gilmore.  [facility] 06/08/2022, 10:33 AM

## 2022-06-08 NOTE — Plan of Care (Signed)
  Problem: Education: Goal: Knowledge of General Education information will improve Description Including pain rating scale, medication(s)/side effects and non-pharmacologic comfort measures Outcome: Progressing   Problem: Health Behavior/Discharge Planning: Goal: Ability to manage health-related needs will improve Outcome: Progressing   

## 2022-06-08 NOTE — Progress Notes (Signed)
Mobility Specialist Progress Note:   06/08/22 0930  Mobility  Activity Turned to right side;Turned to left side;Turned to back - supine  Level of Assistance Standby assist, set-up cues, supervision of patient - no hands on  Assistive Device None  Activity Response Tolerated fair  $Mobility charge 1 Mobility   Session limited by abdominal pain. Pt declining any OOB mobility, ale to perform bed mobility at supervision level. Left with all needs met.   Nelta Numbers Acute Rehab Secure Chat or Office Phone: [phone]

## 2022-06-08 NOTE — TOC Transition Note (Signed)
Transition of Care [facility]) - CM/SW Discharge Note   Patient Details  Name: Kristin Gilmore MRN: [id number] Date of Birth: May 02, 1951  [facility] CM/SW Contact:  Tresa Endo Phone Number: 06/08/2022, 11:18 AM   Clinical Narrative:    Patient will DC to: [facility] Anticipated DC date: 06/08/2022 Family notified: Pt asked to not call friends listed Transport by: Corey Harold   Per MD patient ready for DC to [facility] room 108. RN to call report prior to discharge [phone]). RN, patient, patient's family, and facility notified of DC. Discharge Summary and FL2 sent to facility. DC packet on chart. Ambulance transport requested for patient.   CSW will sign off for now as social work intervention is no longer needed. Please consult [place] again if new needs arise.     Final next level of care: Skilled Nursing Facility Barriers to Discharge: Continued Medical Work up   Patient Goals and CMS Choice Patient states their goals for this hospitalization and ongoing recovery are:: Rehab CMS Medicare.gov Compare Post Acute Care list provided to:: Patient Choice offered to / list presented to : Patient  Discharge Placement                       Discharge Plan and Services In-house Referral: Clinical Social Work   Post Acute Care Choice: [place]                               Social Determinants of Health (SDOH) Interventions     Readmission Risk Interventions     No data to display

## 2022-06-19 ENCOUNTER — Other Ambulatory Visit: Payer: Self-pay

## 2022-06-19 ENCOUNTER — Telehealth: Payer: Medicare Other | Admitting: Internal Medicine

## 2022-06-19 ENCOUNTER — Encounter: Payer: Self-pay | Admitting: Internal Medicine

## 2022-06-19 ENCOUNTER — Ambulatory Visit (INDEPENDENT_AMBULATORY_CARE_PROVIDER_SITE_OTHER): Payer: Medicare Other | Admitting: Internal Medicine

## 2022-06-19 DIAGNOSIS — B9561 Methicillin susceptible Staphylococcus aureus infection as the cause of diseases classified elsewhere: Secondary | ICD-10-CM

## 2022-06-19 DIAGNOSIS — M7021 Olecranon bursitis, right elbow: Secondary | ICD-10-CM | POA: Diagnosis not present

## 2022-06-19 DIAGNOSIS — R7881 Bacteremia: Secondary | ICD-10-CM

## 2022-06-19 NOTE — Progress Notes (Signed)
[place] for Infectious Disease  CHIEF COMPLAINT:    Follow up for MSSA bacteremia  SUBJECTIVE:    Kristin Gilmore is a 71 y.o. female with PMHx as below who presents to the clinic for MSSA bacteremia.   Patient presenting for a phone visit as she is residing at [facility] from her recent prolonged admission.  She was admitted at [facility] 05/27/22-06/08/22 for MSSA bacteremia.  This was likely 2/2 septic bursitis of the right elbow.  This improved with antibiotics alone.  She also complained of back pain in the setting of back surgery with instrumentation in January 2023 with Dr Louanne Skye.  MRI was performed and did not show evidence of spinal infection.  TEE was negative and her bacteremia cleared quickly.  She was discharged on Ancef through 06/27/22 (4 weeks) and discharged to SNF.  She reports doing overall well at rehab.  She is getting better daily.  She has no fevers, chills.  She occasionally has some dizzinss and abdominal pain but is looking forward to a milkshake later today.    Please see A&P for the details of today's visit and status of the patient's medical problems.   Patient's Medications  New Prescriptions   No medications on file  Previous Medications   AMITRIPTYLINE (ELAVIL) 10 MG TABLET    Take 1 tablet (10 mg total) by mouth at bedtime as needed for sleep.   AMLODIPINE (NORVASC) 5 MG TABLET    Take 5 mg by mouth 2 (two) times daily.   ASPIRIN EC 81 MG TABLET    Take 1 tablet (81 mg total) by mouth 2 (two) times daily.   BIOTIN 5000 MCG CAPS    Take 5,000 mcg by mouth every morning.   BISACODYL (DULCOLAX) 5 MG EC TABLET    Take 5-15 mg by mouth daily as needed (constipation).   CALCIUM CITRATE-VITAMIN D (CALCIUM CITRATE + D PO)    Take 1-2 capsules by mouth See admin instructions. Calcium 200 mg, vitamin D3 2.5 mcg - 100 units - take 1 tablet by mouth with breakfast and 2 tablets with supper   CARVEDILOL (COREG) 25 MG TABLET    Take 12.5 mg by mouth 2 (two)  times daily with a meal.   CEFAZOLIN (ANCEF) IVPB    Inject 2 g into the vein every 8 (eight) hours for 20 days. Indication:  MSSA bacteremia/septic bursitis First Dose: Yes Last Day of Therapy:  06/27/22 Labs - Once weekly:  CBC/D and BMP, Labs - Every other week:  ESR and CRP   CHOLECALCIFEROL (VITAMIN D3) 2000 UNITS TABS    Take 4,000 Units by mouth daily with supper.   COENZYME Q10 (COQ10) 100 MG CAPS    Take 100 mg by mouth daily with supper.   DICYCLOMINE (BENTYL) 10 MG CAPSULE    Take 1 capsule (10 mg total) by mouth 3 (three) times daily as needed for spasms.   DOCUSATE SODIUM (COLACE) 100 MG CAPSULE    Take 300 mg by mouth at bedtime as needed (constipation).   FERROUS SULFATE 325 (65 FE) MG EC TABLET    Take 325 mg by mouth See admin instructions. Take one tablet (325) mg) by mouth every afternoon on an empty stomach   GLUCOSAMINE-CHONDROITIN 500-400 MG CAPS    Take 1-2 capsules by mouth See admin instructions. Take one capsule by mouth every morning, and two capsules at night   IBUPROFEN (ADVIL) 200 MG TABLET  Take 400 mg by mouth daily as needed for headache.   LACTOBACILLUS (LACTINEX PO)    Take 1 tablet by mouth daily.   LOPERAMIDE (IMODIUM) 2 MG CAPSULE    Take 2 mg by mouth 4 (four) times daily as needed for diarrhea or loose stools.   MAGNESIUM CITRATE PO    Take 150 mg by mouth at bedtime.   MAGNESIUM OXIDE 500 MG TABS    Take 500 mg by mouth at bedtime.   MENTHOL, TOPICAL ANALGESIC, (BIOFREEZE) 4 % GEL    Apply 1 application topically 4 (four) times daily as needed (pain).   METHOCARBAMOL (ROBAXIN) 500 MG TABLET    Take 1 tablet (500 mg total) by mouth every 8 (eight) hours as needed for muscle spasms.   MUPIROCIN OINTMENT (BACTROBAN) 2 %    Place 1 application into the nose 3 (three) times daily.   OMEGA-3 FATTY ACIDS (FISH OIL) 1000 MG CAPS    Take 1,000 mg by mouth 2 (two) times daily.   OVER THE COUNTER MEDICATION    Take 1 capsule by mouth 2 (two) times daily. [place] brain blood vessel relaxation (butterburr root extract)   OVER THE COUNTER MEDICATION    Apply 1 application topically daily. Leg & back pain relief cream   PANTOPRAZOLE (PROTONIX) 40 MG TABLET    Take 40 mg by mouth 2 (two) times daily.   POTASSIUM CHLORIDE (KLOR-CON) 10 MEQ TABLET    Take 10 mEq by mouth daily.   PROBIOTIC PRODUCT (PROBIOTIC PO)    Take 1 capsule by mouth daily with breakfast. SUPREMA DOPHILUS 5 BILLION CFU   QUETIAPINE (SEROQUEL) 300 MG TABLET    Take 0.5 tablets (150 mg total) by mouth at bedtime as needed.   RED YEAST RICE 600 MG CAPS    Take 1,200 mg by mouth 2 (two) times daily.   SIMETHICONE 180 MG CAPS    Take 180 mg by mouth daily as needed (gas/bloating).   SODIUM CHLORIDE (OCEAN) 0.65 % SOLN NASAL SPRAY    Place 1 spray into both nostrils 3 (three) times daily.   SUMATRIPTAN (IMITREX) 100 MG TABLET    Take 1 tablet (100 mg total) by mouth 2 (two) times daily as needed for up to 28 days for migraine. May repeat in 2 hours if headache persists or recurs.   TELMISARTAN (MICARDIS) 80 MG TABLET    Take 80 mg by mouth at bedtime.   TOLTERODINE (DETROL LA) 4 MG 24 HR CAPSULE    Take 4 mg by mouth daily with breakfast.   TRIHEXYPHENIDYL (ARTANE) 2 MG TABLET    Take 1 tablet (2 mg total) by mouth 3 (three) times daily with meals.   TURMERIC 500 MG CAPS    Take 500 mg by mouth 2 (two) times daily with a meal.  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   Pneumonia    Pre-diabetes    "i was told i was pre-diabetic a while ago"  Tremor 04/13/2014    Social History   Tobacco Use   Smoking status: Never   Smokeless tobacco: Never  Vaping Use   Vaping Use: Never used  Substance Use Topics   Alcohol use: Yes    Comment: occasional glass of wine   Drug use: No    Family History  Problem Relation Age of Onset   Cancer Father        stomach cancer   Migraines Mother     Allergies  Allergen Reactions   Levofloxacin Other (See Comments)     Dizziness (intolerance)   Amoxicillin Hives    UNSPECIFIED REACTION  Has patient had a PCN reaction causing immediate rash, facial/tongue/throat swelling, SOB or lightheadedness with hypotension: Unknown Has patient had a PCN reaction causing severe rash involving mucus membranes or skin necrosis: Unknown Has patient had a PCN reaction that required hospitalization: Unknown Has patient had a PCN reaction occurring within the last 10 years: No If all of the above answers are "NO", then may proceed with Cephalosporin use. Sweats     Clarithromycin Hives, Other (See Comments) and Rash   Atropine Hives   Fish Allergy Other (See Comments)    Childhood reaction. Pt denies further reaction.     OBJECTIVE:      Labs and Microbiology:    Latest Ref Rng & Units 06/08/2022    3:16 AM 06/07/2022    6:52 AM 06/04/2022    6:09 AM  CBC  WBC 4.0 - 10.5 K/uL 8.4  7.2  8.1   Hemoglobin 12.0 - 15.0 g/dL 11.8  11.5  10.9   Hematocrit 36.0 - 46.0 % 35.7  34.8  32.7   Platelets 150 - 400 K/uL 441  430  325       Latest Ref Rng & Units 06/08/2022    3:16 AM 06/07/2022    6:52 AM 06/04/2022    6:09 AM  CMP  Glucose 70 - 99 mg/dL 132  124  160   BUN 8 - 23 mg/dL 26  28  40   Creatinine 0.44 - 1.00 mg/dL 1.00  0.99  1.22   Sodium 135 - 145 mmol/L 133  135  133   Potassium 3.5 - 5.1 mmol/L 4.7  4.2  4.4   Chloride 98 - 111 mmol/L 97  96  96   CO2 22 - 32 mmol/L _0 Calcium 8.9 - 10.3 mg/dL 9.5  9.4  8.4   Total Protein 6.5 - 8.1 g/dL 7.0  6.7  5.7   Total Bilirubin 0.3  - 1.2 mg/dL 0.5  0.3  0.3   Alkaline Phos 38 - 126 U/L 99  94  93   AST 15 - 41 U/L _1 ALT 0 - 44 U/L _2 No results found for this or any previous visit (from the past 240 hour(s)).  Imaging:    ASSESSMENT & PLAN:    1. Bacteremia due to methicillin susceptible Staphylococcus aureus (MSSA)  2. Olecranon bursitis of right elbow   She is here for hospital follow up for MSSA bacteremia due to suspected septic right olecranon bursitis.  She will complete 4 weeks of IV cefazolin 2 gm q8h on 06/27/22 and then PICC line will be removed.  Continue with orthopedic follow up for her chronic back pain.  RTC as needed.   [place] [facility] Infectious  Disease [facility] 06/19/2022, 7:12 AM  Virtual Visit via Telephone Note   I connected with Ajani Schnieders [place] on 06/19/22 at 2:38 PM by telephone and verified that I am speaking with the correct person using two identifiers.   I discussed the limitations, risks, security and privacy concerns of performing an evaluation and management service by telephone and the availability of in person appointments. I also discussed with the patient that there may be a patient responsible charge related to this service. The patient expressed understanding and agreed to proceed.  Patient location: SNF My location: [facility] Duration of call: 12 min (23 min in chart)

## 2022-06-20 ENCOUNTER — Telehealth: Payer: Self-pay | Admitting: Specialist

## 2022-06-20 ENCOUNTER — Encounter: Payer: Medicare Other | Admitting: Neurology

## 2022-06-20 NOTE — Telephone Encounter (Signed)
Patient is in rehab and is wanting to know the next steps she should take about possibly scheduling a surgery. Does she need an appointment with Dr. Louanne Skye first? CB # [phone]

## 2022-06-20 NOTE — Telephone Encounter (Signed)
Patient called back again requesting a call back asap and also wanting to make sure she will be released from rehab on 9/29. CB # [phone]

## 2022-06-20 NOTE — Telephone Encounter (Signed)
I tried to call patient but had to lmom---if she calls back she needs an appointment with Dr. Laurance Flatten per Dr. Louanne Skye

## 2022-06-23 NOTE — Progress Notes (Signed)
Office Visit Note   Patient: Kristin Gilmore           Date of Birth: [birth year]           MRN: [id number] Visit Date: 03/20/2022              Requested by: Aletha Halim., PA-C [address],  [place] [address] PCP: [facility]   Assessment & Plan: Visit Diagnoses:  1. Status post lumbar spinal fusion   2. Radiculopathy, lumbar region   3. Status post total right knee replacement     Plan: Since patient is complaining of increased pain I will schedule CT lumbar spine and have her follow-up with Dr. Louanne Skye.  Put in referral for formal PT.  No narcotics given.  Follow-Up Instructions: Return in about 4 weeks (around 04/17/2022) for WITH DR NITKA TO REVIEW CT LUMBAR SPINE.   Orders:  Orders Placed This Encounter  Procedures   CT LUMBAR SPINE WO CONTRAST   Ambulatory referral to Physical Therapy   No orders of the defined types were placed in this encounter.     Procedures: No procedures performed   Clinical Data: No additional findings.   Subjective: Chief Complaint  Patient presents with   Lower Back - Follow-up    HPI 71 year old female comes in complaining of severe back pain.  She is status post L3-4 and L4-5 fusion October 30, 2021.  Patient ambulates with a walker.  States that her back hurts to sit walk and is all the time.  No numbness and tingling.  States that she would like to see Dr. Louanne Skye in the afternoon for right knee but he is not her physician for that problem.  Patient was seen by Dr. Louanne Skye November 15, 2021.  Patient canceled appointments with [place] December 20, 2021, January 07, 2022, Jan 28, 2022. Review of Systems No current complaints of cardiopulmonary GI/GU issues  Objective: Vital Signs: BP 140/75   Pulse 85   Ht 5' 7.5" (1.715 m)   Wt 156 lb (70.8 kg)   BMI 24.07 kg/m   Physical Exam During the visit patient was argumentative.  She is done this in the past.  She did bring in a friend who was  driving for her.  She is neurologically intact. Ortho Exam  Specialty Comments:  PER Dr. Fabio Bering patient needs to be seen for her RIGHT Knee- she should be following up with Dr. Erlinda Hong   Imaging: No results found.   PMFS History: Patient Active Problem List   Diagnosis Date Noted   Lumbar radicular pain    AKI (acute kidney injury) ([place])    Bursitis of right elbow    Bacteremia due to methicillin susceptible Staphylococcus aureus (MSSA) 05/29/2022   SIRS (systemic inflammatory response syndrome) ([place]) 05/28/2022   Bipolar disorder, in partial remission, most recent episode hypomanic ([place]) 05/28/2022   Hyperkalemia 05/28/2022   Dehydration 05/28/2022   Essential hypertension 05/28/2022   Intractable back pain 05/27/2022   Other spondylosis with radiculopathy, lumbar region 10/30/2021   Other secondary scoliosis, lumbar region 10/30/2021   Spondylolisthesis, lumbar region 10/30/2021   Spondylolisthesis of lumbosacral region 10/30/2021   Status post total right knee replacement 06/26/2021   Primary osteoarthritis of right knee 06/25/2021   Parkinson's disease ([place]) 01/24/2021   Trochanteric bursitis, left hip 01/18/2020   Lumbar spinal stenosis 12/19/2017   Pain in joint, ankle and foot 05/02/2016   Chronic venous insufficiency 05/02/2016  Peripheral edema 11/01/2015   Intractable chronic migraine without aura 11/02/2014   Tremor 04/13/2014   Migraine without aura 04/13/2014   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   Past Medical History:  Diagnosis Date   Arthritis    Complication of anesthesia    "It didn't knock me out quick enough."   Depression    Dyslipidemia    Hypertension    Migraine    Migraine without aura, without mention of intractable migraine without mention of status migrainosus 04/13/2014   Parkinson's disease ([place]) 01/24/2021   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   Pneumonia    Pre-diabetes    "i was told i was pre-diabetic a while ago"    Tremor 04/13/2014    Family History  Problem Relation Age of Onset   Cancer Father        stomach cancer   Migraines Mother     Past Surgical History:  Procedure Laterality Date   CHOLECYSTECTOMY     COLONOSCOPY W/ POLYPECTOMY     LUMBAR LAMINECTOMY  07/2020   3 level decompression - [place], [place]   LUMBAR LAMINECTOMY/DECOMPRESSION MICRODISCECTOMY N/A 12/19/2017   Procedure: LAMINECTOMY LUMBAR TWO- LUMBAR THREE, LUMBAR THREE- LUMBAR FOUR, LUMBAR FOUR- LUMBAR FIVE ;  Surgeon: Consuella Lose, MD;  Location: [facility];  Service: Neurosurgery;  Laterality: N/A;   NASAL SINUS SURGERY     RADIOLOGY WITH ANESTHESIA N/A 05/30/2022   Procedure: MRI WITH ANESTHESIA LUMBAR SPINE W/WO CONSTRAST;  Surgeon: Radiologist, Medication, MD;  Location: [facility];  Service: Radiology;  Laterality: N/A;   TEE WITHOUT CARDIOVERSION N/A 06/05/2022   Procedure: TRANSESOPHAGEAL ECHOCARDIOGRAM (TEE);  Surgeon: Pixie Casino, MD;  [facility] END[facility]COPY;  Service: Cardiovascular;  Laterality: N/A;   TONSILLECTOMY     TOTAL KNEE ARTHROPLASTY Right 06/26/2021   Procedure: RIGHT TOTAL KNEE ARTHROPLASTY;  Surgeon: Leandrew Koyanagi, MD;  Location: [facility];  Service: Orthopedics;  Laterality: Right;   WISDOM TOOTH EXTRACTION     Social History   Occupational History   Occupation: Retired  Tobacco Use   Smoking status: Never   Smokeless tobacco: Never  Vaping Use   Vaping Use: Never used  Substance and Sexual Activity   Alcohol use: Yes    Comment: occasional glass of wine   Drug use: No   Sexual activity: Not on file

## 2022-07-02 ENCOUNTER — Ambulatory Visit: Payer: Self-pay

## 2022-07-02 ENCOUNTER — Ambulatory Visit (INDEPENDENT_AMBULATORY_CARE_PROVIDER_SITE_OTHER): Payer: Medicare Other | Admitting: Orthopaedic Surgery

## 2022-07-02 ENCOUNTER — Ambulatory Visit (INDEPENDENT_AMBULATORY_CARE_PROVIDER_SITE_OTHER): Payer: Medicare Other

## 2022-07-02 DIAGNOSIS — Z96651 Presence of right artificial knee joint: Secondary | ICD-10-CM | POA: Diagnosis not present

## 2022-07-02 DIAGNOSIS — M25511 Pain in right shoulder: Secondary | ICD-10-CM | POA: Diagnosis not present

## 2022-07-02 MED ORDER — BUPIVACAINE HCL 0.25 % IJ SOLN
2.0000 mL | INTRAMUSCULAR | Status: AC | PRN
  Administered 2022-07-02: 2 mL via INTRA_ARTICULAR

## 2022-07-02 MED ORDER — METHYLPREDNISOLONE ACETATE 40 MG/ML IJ SUSP
80.0000 mg | INTRAMUSCULAR | Status: AC | PRN
  Administered 2022-07-02: 80 mg via INTRA_ARTICULAR

## 2022-07-02 MED ORDER — LIDOCAINE HCL 1 % IJ SOLN
2.0000 mL | INTRAMUSCULAR | Status: AC | PRN
  Administered 2022-07-02: 2 mL

## 2022-07-02 NOTE — Addendum Note (Signed)
Addended by: Otelia Sergeant on: 07/02/2022 03:59 PM   Modules accepted: Orders

## 2022-07-02 NOTE — Progress Notes (Signed)
   Procedure Note  Patient: Kristin Gilmore             Date of Birth: March 17, 1951           MRN: [id number]             Visit Date: 07/02/2022  Procedures: Visit Diagnoses:  1. Acute pain of right shoulder   2. Status post total right knee replacement    Large Joint Inj: R glenohumeral on 07/02/2022 4:22 PM Indications: pain Details: 22 G 3.5 in needle, ultrasound-guided posterior approach Medications: 2 mL lidocaine 1 %; 2 mL bupivacaine 0.25 %; 80 mg methylPREDNISolone acetate 40 MG/ML Outcome: tolerated well, no immediate complications  US-guided glenohumeral joint injection, right  shoulder After discussion on risks/benefits/indications, informed verbal consent was obtained. A timeout was then performed. The patient was positioned lying lateral recumbent on examination table. The patient's shoulder was prepped with betadine and multiple alcohol swabs and utilizing ultrasound guidance, the patient's glenohumeral joint was identified on ultrasound. Using ultrasound guidance a 22-gauge, 3.5 inch needle with a mixture of 2:2:2 cc's lidocaine:bupivicaine:depomedrol was directed from a lateral to medial direction via in-plane technique into the glenohumeral joint with visualization of appropriate spread of injectate into the joint. Patient tolerated the procedure well without immediate complications.      Procedure, treatment alternatives, risks and benefits explained, specific risks discussed. Consent was given by the patient. Immediately prior to procedure a time out was called to verify the correct patient, procedure, equipment, support staff and site/side marked as required. Patient was prepped and draped in the usual sterile fashion.

## 2022-07-02 NOTE — Progress Notes (Signed)
Office Visit Note   Patient: Kristin Gilmore           Date of Birth: [birth year]           MRN: [id number] Visit Date: 07/02/2022              Requested by: [facility] [address] [place],  [place] [address] PCP: [facility]   Assessment & Plan: Visit Diagnoses:  1. Acute pain of right shoulder   2. Status post total right knee replacement     Plan: Impression is chronic right knee pain status post total knee replacement over a year ago as well as a right shoulder pain.  We will order bone scan to evaluate for loosening and complications with the implant.  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  We will send to Dr. Rolena Infante for intra-articular injection.  Follow-up after the bone scan.  Follow-Up Instructions: No follow-ups on file.   Orders:  Orders Placed This Encounter  Procedures   XR Shoulder Right   No orders of the defined types were placed in this encounter.     Procedures: No procedures performed   Clinical Data: No additional findings.   Subjective: Chief Complaint  Patient presents with   Right Shoulder - Pain    HPI Kristin Gilmore returns today for ongoing right knee pain.  She is a year status post right total knee replacement on 06/26/2021.  Also complaining of right shoulder pain.  Was recently hospitalized for bacteremia.  Spent 21 days in a SNF receiving IV antibiotics.  Review of Systems   Objective: Vital Signs: There were no vitals taken for this visit.  Physical Exam  Ortho Exam Examination of the right knee is unchanged.  She has about a 15 degree flexion contracture.  No signs of infection. Examination of the right shoulder shows mild limitation range of motion in all planes.  Manual muscle testing of the rotator cuff is grossly normal. Specialty Comments:  PER Dr. Fabio Bering patient needs to be seen for her RIGHT Knee- she should be following up  with Dr. Erlinda Hong   Imaging: XR Shoulder Right  Result Date: 07/02/2022 Moderate arthritic changes to the [facility] joint.  Mild downward sloping acromion.  No acute abnormalities.    PMFS History: Patient Active Problem List   Diagnosis Date Noted   Acute pain of right shoulder 07/02/2022   Lumbar radicular pain    AKI (acute kidney injury) ([place])    Bursitis of right elbow    Bacteremia due to methicillin susceptible Staphylococcus aureus (MSSA) 05/29/2022   SIRS (systemic inflammatory response syndrome) ([place]) 05/28/2022   Bipolar disorder, in partial remission, most recent episode hypomanic ([place]) 05/28/2022   Hyperkalemia 05/28/2022   Dehydration 05/28/2022   Essential hypertension 05/28/2022   Intractable back pain 05/27/2022   Other spondylosis with radiculopathy, lumbar region 10/30/2021   Other secondary scoliosis, lumbar region 10/30/2021   Spondylolisthesis, lumbar region 10/30/2021   Spondylolisthesis of lumbosacral region 10/30/2021   Status post total right knee replacement 06/26/2021   Primary osteoarthritis of right knee 06/25/2021   Parkinson's disease 01/24/2021   Trochanteric bursitis, left hip 01/18/2020   Lumbar spinal stenosis 12/19/2017   Pain in joint, ankle and foot 05/02/2016   Chronic venous insufficiency 05/02/2016   Peripheral edema 11/01/2015   Intractable chronic migraine without aura 11/02/2014   Tremor 04/13/2014   Migraine without aura 04/13/2014   Pituitary microadenoma (  [place]) 04/13/2014   Past Medical History:  Diagnosis Date   Arthritis    Complication of anesthesia    "It didn't knock me out quick enough."   Depression    Dyslipidemia    Hypertension    Migraine    Migraine without aura, without mention of intractable migraine without mention of status migrainosus 04/13/2014   Parkinson's disease ([place]) 01/24/2021   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   Pneumonia    Pre-diabetes    "i was told i was pre-diabetic a while ago"   Tremor  04/13/2014    Family History  Problem Relation Age of Onset   Cancer Father        stomach cancer   Migraines Mother     Past Surgical History:  Procedure Laterality Date   CHOLECYSTECTOMY     COLONOSCOPY W/ POLYPECTOMY     LUMBAR LAMINECTOMY  07/2020   3 level decompression - [place], [place]   LUMBAR LAMINECTOMY/DECOMPRESSION MICRODISCECTOMY N/A 12/19/2017   Procedure: LAMINECTOMY LUMBAR TWO- LUMBAR THREE, LUMBAR THREE- LUMBAR FOUR, LUMBAR FOUR- LUMBAR FIVE ;  Surgeon: Consuella Lose, MD;  Location: [facility];  Service: Neurosurgery;  Laterality: N/A;   NASAL SINUS SURGERY     RADIOLOGY WITH ANESTHESIA N/A 05/30/2022   Procedure: MRI WITH ANESTHESIA LUMBAR SPINE W/WO CONSTRAST;  Surgeon: Radiologist, Medication, MD;  Location: [facility];  Service: Radiology;  Laterality: N/A;   TEE WITHOUT CARDIOVERSION N/A 06/05/2022   Procedure: TRANSESOPHAGEAL ECHOCARDIOGRAM (TEE);  Surgeon: Pixie Casino, MD;  Location: [facility] ENDOSCOPY;  Service: Cardiovascular;  Laterality: N/A;   TONSILLECTOMY     TOTAL KNEE ARTHROPLASTY Right 06/26/2021   Procedure: RIGHT TOTAL KNEE ARTHROPLASTY;  Surgeon: Leandrew Koyanagi, MD;  Location: [facility];  Service: Orthopedics;  Laterality: Right;   WISDOM TOOTH EXTRACTION     Social History   Occupational History   Occupation: Retired  Tobacco Use   Smoking status: Never   Smokeless tobacco: Never  Vaping Use   Vaping Use: Never used  Substance and Sexual Activity   Alcohol use: Yes    Comment: occasional glass of wine   Drug use: No   Sexual activity: Not on file

## 2022-07-03 ENCOUNTER — Ambulatory Visit: Payer: Medicare Other | Admitting: Orthopedic Surgery

## 2022-07-03 NOTE — Addendum Note (Signed)
Addended by: Lendon Collar on: 07/03/2022 10:46 AM   Modules accepted: Orders

## 2022-07-05 ENCOUNTER — Telehealth: Payer: Self-pay | Admitting: Orthopaedic Surgery

## 2022-07-05 NOTE — Telephone Encounter (Signed)
What MRI is she referring to?

## 2022-07-05 NOTE — Telephone Encounter (Signed)
Patient would like to get someone  to discuss her MRI she did not get results when she was  here

## 2022-07-08 ENCOUNTER — Ambulatory Visit: Payer: Medicare Other | Admitting: Orthopedic Surgery

## 2022-07-08 ENCOUNTER — Other Ambulatory Visit: Payer: Self-pay | Admitting: Specialist

## 2022-07-08 NOTE — Telephone Encounter (Signed)
Called and LMOM for patient.  

## 2022-07-08 NOTE — Telephone Encounter (Signed)
Pt called requesting a refill of pain medication. Please send to pharmacy on file. Pt phone number is [phone].

## 2022-07-08 NOTE — Telephone Encounter (Signed)
Pt called about hydromorphone. Please send to pharmacy on file. Pt phone number is [phone].

## 2022-07-09 ENCOUNTER — Telehealth: Payer: Self-pay | Admitting: Specialist

## 2022-07-09 NOTE — Telephone Encounter (Signed)
Pt called again about her pain medication. Please send meds. Pt states she is in severe pain and need her meds. Please call pt at [phone].

## 2022-07-09 NOTE — Telephone Encounter (Signed)
Pt called requesting refill of hydromorphone pain medication. Please send to pharmacy on file. Pt phone number is [phone].

## 2022-07-10 ENCOUNTER — Encounter (HOSPITAL_BASED_OUTPATIENT_CLINIC_OR_DEPARTMENT_OTHER): Payer: Self-pay | Admitting: Emergency Medicine

## 2022-07-10 ENCOUNTER — Emergency Department (HOSPITAL_BASED_OUTPATIENT_CLINIC_OR_DEPARTMENT_OTHER)
Admission: EM | Admit: 2022-07-10 | Discharge: 2022-07-10 | Disposition: A | Discharge status: Home / Self Care | Payer: Medicare Other | Attending: Emergency Medicine | Admitting: Emergency Medicine

## 2022-07-10 ENCOUNTER — Other Ambulatory Visit: Payer: Self-pay

## 2022-07-10 DIAGNOSIS — Z79899 Other long term (current) drug therapy: Secondary | ICD-10-CM | POA: Diagnosis not present

## 2022-07-10 DIAGNOSIS — M545 Low back pain, unspecified: Secondary | ICD-10-CM | POA: Diagnosis not present

## 2022-07-10 DIAGNOSIS — Z7982 Long term (current) use of aspirin: Secondary | ICD-10-CM | POA: Diagnosis not present

## 2022-07-10 DIAGNOSIS — Z760 Encounter for issue of repeat prescription: Secondary | ICD-10-CM | POA: Insufficient documentation

## 2022-07-10 DIAGNOSIS — I10 Essential (primary) hypertension: Secondary | ICD-10-CM | POA: Insufficient documentation

## 2022-07-10 MED ORDER — HYDROMORPHONE HCL 1 MG/ML IJ SOLN
1.0000 mg | Freq: Once | INTRAMUSCULAR | Status: AC
  Administered 2022-07-10: 1 mg via SUBCUTANEOUS
  Filled 2022-07-10: qty 1

## 2022-07-10 MED ORDER — HYDROMORPHONE HCL 2 MG PO TABS: 2.0000 mg | ORAL_TABLET | Freq: Four times a day (QID) | ORAL | 0 refills | Status: DC | PRN

## 2022-07-10 NOTE — ED Triage Notes (Signed)
Pt has chronic back pain.  Pt states she ran out of pain medication two days ago and wishes to have a refill.  Pt states she has a doctors appt on Monday.

## 2022-07-10 NOTE — ED Triage Notes (Addendum)
BIB EMS. Hx of several surgeries resulting in chronic back pain. States went to rehab and was prescribed narcotics. States has ran out of pain meds, called ortho but didn't prescribe meds. Here for pain control until she can see ortho.

## 2022-07-10 NOTE — ED Provider Notes (Signed)
[facility] EMERGENCY DEPARTMENT Provider Note   CSN: [id number] Arrival date & time: 07/10/22  1832     History  Chief Complaint  Patient presents with   Medication Refill    Kristin Gilmore is a 71 y.o. female.   Medication Refill   71 year old female presents emergency department with complaints of low back pain.  Patient states that she has a history of chronic low back pain due to lumbar radiculopathy which has been operated on multiple times.  She reports being out of pain medication due to orthopedic turnover.  She states that her orthopedic surgeon is retiring soon and she has an appointment on Monday with a new provider.  She states that she tried to contact orthopedic office both yesterday and today with no response due to Dr. Being in surgery and was not able to get a prescription of her pain medication.  She denies any repeat trauma/mechanism of injury.  She states has been trying to take at home Tylenol/ibuprofen with some relief of symptoms the pain has been getting significantly worse.  Denies saddle anesthesia, bowel/bladder dysfunction, weakness/sensory deficit lower extremities, fever, history of IV drug use, no malignancy.  Past medical history significant for lumbar radiculopathy, bacteremia due to MSSA, lumbar spinal stenosis, hypertension, dyslipidemia  Home Medications Prior to Admission medications   Medication Sig Start Date End Date Taking? Authorizing Provider  HYDROmorphone (DILAUDID) 2 MG tablet Take 1 tablet (2 mg total) by mouth every 6 (six) hours as needed for severe pain. 07/10/22  Yes Dion Saucier A, PA  ALPRAZolam Duanne Moron) 0.25 MG tablet Take 0.25 mg by mouth at bedtime as needed for anxiety.    [provider]  amitriptyline (ELAVIL) 10 MG tablet Take 1 tablet (10 mg total) by mouth at bedtime as needed for sleep. 06/08/22   Elodia Florence., MD  amLODipine (NORVASC) 5 MG tablet Take 5 mg by mouth 2 (two) times daily.  07/27/18   [provider]  aspirin EC 81 MG tablet Take 1 tablet (81 mg total) by mouth 2 (two) times daily. Patient taking differently: Take 81 mg by mouth daily with breakfast. 06/26/21   Aundra Dubin, PA-C  Biotin 5000 MCG CAPS Take 5,000 mcg by mouth every morning.    [provider]  bisacodyl (DULCOLAX) 5 MG EC tablet Take 5-15 mg by mouth daily as needed (constipation).    [provider]  Calcium Citrate-Vitamin D (CALCIUM CITRATE + D PO) Take 1-2 capsules by mouth See admin instructions. Calcium 200 mg, vitamin D3 2.5 mcg - 100 units - take 1 tablet by mouth with breakfast and 2 tablets with supper    [provider]  carvedilol (COREG) 25 MG tablet Take 12.5 mg by mouth 2 (two) times daily with a meal. 07/14/19   [provider]  Cholecalciferol (VITAMIN D3) 2000 UNITS TABS Take 4,000 Units by mouth daily with supper.    [provider]  Coenzyme Q10 (COQ10) 100 MG CAPS Take 100 mg by mouth daily with supper.    [provider]  dicyclomine (BENTYL) 10 MG capsule Take 1 capsule (10 mg total) by mouth 3 (three) times daily as needed for spasms. 06/08/22   Elodia Florence., MD  docusate sodium (COLACE) 100 MG capsule Take 300 mg by mouth at bedtime as needed (constipation).    [provider]  ferrous sulfate 325 (65 FE) MG EC tablet Take 325 mg by mouth See admin instructions. Take  one tablet (325) mg) by mouth every afternoon on an empty stomach 10/05/19   [provider]  Glucosamine-Chondroitin 500-400 MG CAPS Take 1-2 capsules by mouth See admin instructions. Take one capsule by mouth every morning, and two capsules at night    [provider]  ibuprofen (ADVIL) 200 MG tablet Take 400 mg by mouth daily as needed for headache.    [provider]  Lactobacillus (LACTINEX PO) Take 1 tablet by mouth daily.    [provider]  loperamide (IMODIUM) 2 MG capsule Take 2 mg by mouth 4  (four) times daily as needed for diarrhea or loose stools.    [provider]  losartan (COZAAR) 100 MG tablet Take 100 mg by mouth daily.    [provider]  MAGNESIUM CITRATE PO Take 150 mg by mouth at bedtime. Patient not taking: Reported on 06/19/2022    [provider]  Magnesium Oxide 500 MG TABS Take 400 mg by mouth at bedtime.    [provider]  Menthol, Topical Analgesic, (BIOFREEZE) 4 % GEL Apply 1 application topically 4 (four) times daily as needed (pain).    [provider]  methocarbamol (ROBAXIN) 500 MG tablet Take 1 tablet (500 mg total) by mouth every 8 (eight) hours as needed for muscle spasms. 11/02/21   Jessy Oto, MD  mupirocin ointment (BACTROBAN) 2 % Place 1 application into the nose 3 (three) times daily. 06/16/21   [provider]  Omega-3 Fatty Acids (FISH OIL) 1000 MG CAPS Take 1,000 mg by mouth 2 (two) times daily.    [provider]  OVER THE COUNTER MEDICATION Take 1 capsule by mouth 2 (two) times daily. [place] brain blood vessel relaxation (butterburr root extract) Patient not taking: Reported on 06/19/2022    [provider]  OVER THE COUNTER MEDICATION Apply 1 application topically daily. Leg & back pain relief cream Patient not taking: Reported on 06/19/2022    [provider]  pantoprazole (PROTONIX) 40 MG tablet Take 40 mg by mouth 2 (two) times daily.    [provider]  potassium chloride (KLOR-CON) 10 MEQ tablet Take 10 mEq by mouth daily. 05/06/22   [provider]  Probiotic Product (PROBIOTIC PO) Take 1 capsule by mouth daily with breakfast. SUPREMA DOPHILUS 5 BILLION CFU Patient not taking: Reported on 06/19/2022    [provider]  QUEtiapine (SEROQUEL) 300 MG tablet Take 0.5 tablets (150 mg total) by mouth at bedtime as needed. 06/08/22   Elodia Florence., MD  Red Yeast Rice 600 MG CAPS Take 1,200 mg by mouth 2 (two) times daily.     [provider]  Simethicone 180 MG CAPS Take 180 mg by mouth daily as needed (gas/bloating).    [provider]  sodium chloride (OCEAN) 0.65 % SOLN nasal spray Place 1 spray into both nostrils 3 (three) times daily.    [provider]  SUMAtriptan (IMITREX) 100 MG tablet Take 1 tablet (100 mg total) by mouth 2 (two) times daily as needed for up to 28 days for migraine. May repeat in 2 hours if headache persists or recurs. 02/28/22 03/28/22  Garvin Fila, MD  telmisartan (MICARDIS) 80 MG tablet Take 80 mg by mouth at bedtime. Patient not taking: Reported on 06/19/2022    [provider]  tolterodine (DETROL LA) 4 MG 24 hr capsule Take 4 mg by mouth daily with breakfast. 02/20/21   [provider]  trihexyphenidyl (ARTANE)  2 MG tablet Take 1 tablet (2 mg total) by mouth 3 (three) times daily with meals. Patient not taking: Reported on 06/19/2022 01/31/22   Penumalli, Earlean Polka, MD  Turmeric 500 MG CAPS Take 500 mg by mouth 2 (two) times daily with a meal.    [provider]      Allergies    Levofloxacin, Amoxicillin, Clarithromycin, Atropine, and Fish allergy    Review of Systems   Review of Systems  All other systems reviewed and are negative.   Physical Exam Updated Vital Signs Pulse 93   Temp 98.6 F (37 C) (Oral)   Resp 20   Ht '5\' 7"'$  (1.702 m)   Wt 77.1 kg   SpO2 100%   BMI 26.63 kg/m  Physical Exam Vitals and nursing note reviewed.  Constitutional:      General: She is not in acute distress.    Appearance: She is well-developed.  HENT:     Head: Normocephalic and atraumatic.  Eyes:     Conjunctiva/sclera: Conjunctivae normal.  Cardiovascular:     Rate and Rhythm: Normal rate and regular rhythm.  Pulmonary:     Effort: Pulmonary effort is normal. No respiratory distress.     Breath sounds: Normal breath sounds. No wheezing or rales.  Abdominal:     Palpations: Abdomen is soft.     Tenderness: There is no abdominal  tenderness.  Musculoskeletal:        General: No swelling.     Cervical back: Neck supple.     Comments: Midline tenderness to lumbar spine with no obvious step-off or deformity.  Prior surgical scar rates without signs of erythematous skin changes or palpable fluctuance.  Patient has equal muscle strength in bilateral lower extremities.  She is complaining no sensory deficits to all major nerve distributions of lower extremities.  Dorsalis pedis pulses full and intact bilaterally.  Skin:    General: Skin is warm and dry.     Capillary Refill: Capillary refill takes less than 2 seconds.  Neurological:     Mental Status: She is alert.  Psychiatric:        Mood and Affect: Mood normal.     ED Results / Procedures / Treatments   Labs (all labs ordered are listed, but only abnormal results are displayed) Labs Reviewed - No data to display  EKG None  Radiology No results found.  Procedures Procedures    Medications Ordered in ED Medications  HYDROmorphone (DILAUDID) injection 1 mg (1 mg Subcutaneous Given 07/10/22 2111)    ED Course/ Medical Decision Making/ A&P                           Medical Decision Making Risk Prescription drug management.   This patient presents to the ED for concern of low back pain, this involves an extensive number of treatment options, and is a complaint that carries with it a high risk of complications and morbidity.  The differential diagnosis includes cauda equina, spinal epidural abscess, lumbar radiculopathy, spinal stenosis, disc herniation, abdominal pathology, muscular strain, fracture, dislocation  Co morbidities that complicate the patient evaluation  See HPI   Additional history obtained:  Additional history obtained from EMR External records from outside source obtained and reviewed including prior prescription filled on 10/2 for Dilaudid.   Lab Tests:  N/a   Imaging Studies ordered:  N/a   Cardiac Monitoring: /  EKG:  The patient was maintained on a  cardiac monitor.  I personally viewed and interpreted the cardiac monitored which showed an underlying rhythm of: Sinus rhythm   Consultations Obtained:  N/a   Problem List / ED Course / Critical interventions / Medication management  Low back pain I ordered medication including hydromorphone subcu for back pain   Reevaluation of the patient after these medicines showed that the patient improved I have reviewed the patients home medicines and have made adjustments as needed   Social Determinants of Health:  Chronic opioid dependence.   Test / Admission - Considered:  Low back pain Vitals signs within normal range and stable throughout visit. Patient symptoms likely secondary to chronic low back pain given lack of exacerbating factor as well as chronic pain that is unchanged in nature.  Patient eliciting no signs of red flag signs for her back pain.  She has been afebrile, no new weakness/sensory deficits, saddle esthesia, bowel/bladder dysfunction..  Further imaging deemed unnecessary at this time.  Patient has close follow-up with orthopedic outpatient for new provider establishment.  Last prescription filled for hydromorphone on 10/2 which is a 5-day prescription.  Reasonable to extend patient's prescription through the weekend pending orthopedic follow-up on Monday also given that she has been reaching out to orthopedics several times over the past 2 to 3 days with no response from physician.  Treatment plan discussed at length with patient she knowledge understanding was agreeable to said plan. Worrisome signs and symptoms were discussed with the patient, and the patient acknowledged understanding to return to the ED if noticed. Patient was stable upon discharge.          Final Clinical Impression(s) / ED Diagnoses Final diagnoses:  Medication refill    Rx / DC Orders ED Discharge Orders          Ordered    HYDROmorphone  (DILAUDID) 2 MG tablet  Every 6 hours PRN        07/10/22 2125              Wilnette Kales, [place] 07/10/22 2125    Dorie Rank, MD 07/11/22 1606

## 2022-07-10 NOTE — Discharge Instructions (Signed)
Your medicine was refilled and sent to your pharmacy.  Keep your upcoming appoint with orthopedic on Monday for continued prescription.

## 2022-07-10 NOTE — ED Notes (Signed)
[facility] called at 9:25 pm to transport patient home.

## 2022-07-11 ENCOUNTER — Encounter (HOSPITAL_COMMUNITY): Payer: Medicare Other

## 2022-07-11 NOTE — Telephone Encounter (Signed)
Duplicate message, pending with [place]

## 2022-07-11 NOTE — Telephone Encounter (Signed)
Duplicate message. 

## 2022-07-12 ENCOUNTER — Other Ambulatory Visit: Payer: Self-pay | Admitting: Diagnostic Neuroimaging

## 2022-07-12 NOTE — Telephone Encounter (Signed)
FYI  Patient went to ED on 07/10/2022 due to being out of pain medication. She was given Hydromorphone '2mg'$  #12.   She has appointment scheduled with Dr. Laurance Flatten on 07/15/2022.

## 2022-07-15 ENCOUNTER — Ambulatory Visit (INDEPENDENT_AMBULATORY_CARE_PROVIDER_SITE_OTHER): Payer: Medicare Other | Admitting: Orthopedic Surgery

## 2022-07-15 ENCOUNTER — Ambulatory Visit (INDEPENDENT_AMBULATORY_CARE_PROVIDER_SITE_OTHER): Payer: Medicare Other

## 2022-07-15 ENCOUNTER — Encounter: Payer: Self-pay | Admitting: Orthopedic Surgery

## 2022-07-15 VITALS — Ht 67.0 in | Wt 160.0 lb

## 2022-07-15 DIAGNOSIS — G20B1 Parkinson's disease with dyskinesia, without mention of fluctuations: Secondary | ICD-10-CM

## 2022-07-15 DIAGNOSIS — G8929 Other chronic pain: Secondary | ICD-10-CM

## 2022-07-15 DIAGNOSIS — M5442 Lumbago with sciatica, left side: Secondary | ICD-10-CM | POA: Diagnosis not present

## 2022-07-15 DIAGNOSIS — M5441 Lumbago with sciatica, right side: Secondary | ICD-10-CM

## 2022-07-15 DIAGNOSIS — M5416 Radiculopathy, lumbar region: Secondary | ICD-10-CM

## 2022-07-15 MED ORDER — HYDROCODONE-ACETAMINOPHEN 5-325 MG PO TABS: 1.0000 | ORAL_TABLET | Freq: Four times a day (QID) | ORAL | 0 refills | Status: AC | PRN

## 2022-07-15 MED ORDER — TRIHEXYPHENIDYL HCL 2 MG PO TABS: 2.0000 mg | ORAL_TABLET | Freq: Three times a day (TID) | ORAL | 1 refills | Status: DC

## 2022-07-15 NOTE — Progress Notes (Signed)
Orthopedic Spine Surgery Office Note  Assessment: Patient is a 71 y.o. female with lumbar back pain and possibly L5 radiculopathy.  She does have what appears to be complete resection of her inferior facet on the left at L2 with translation through the facet joint at L2-3.  However, she is not symptomatic in her upper lumbar spine and her pain is worse when sitting down.  She also has stenosis at L2-3 but is not having any neurogenic claudication or radicular symptoms in those distributions.  Her main issue is low back pain and radicular pain. The radicular pain is more in an L5 distribution and could be coming from foraminal stenosis at L5/S1. Some, but not all, of her back pain may be coming from pseudarthrosis.    Plan: -She has tried activity modification, narcotic pain medications, physical therapy, home exercises without any significant relief. -I explained that some of her pain would not be treated with any kind of surgery, especially the pain that she has with superficial palpation over her lower lumbar spine -Some of this more chronic pain that I do not think is from her spine will have to be treated with pain management. I gave her a one-time narcotic prescription since my partner had previously been treating her more chronic pain bu the is retiring so I am taking over. I explained that this needs to last her until she gets to pain management. -She also requested a refill of one of her Trihexyphenidyl because she is almost out and needs it for her parkinson's. I told her I would again do a one-tim refill but she would need to establish care with a neurologist to get her parkinsons treated. A referral was provided her to neurology. -Recommended L5 transforaminal injections since she has stenosis at L5/S1 foramen and some of her pain seems to be in an L5 distribution. I instructed her to pay particular attention to the first 24 hours for diagnostic purposes. A referral was provided to Dr. Ernestina Patches  for these injections.  -Patient should return to office in 4 weeks, repeat x-rays of lumbar spine at next visit: scoliosis   Patient expressed understanding of the plan and all questions were answered to their satisfaction.   ___________________________________________________________________________   History:  Patient is a 71 y.o. female who presents today for lumbar spine.  Patient has had a long history of low back and bilateral leg pain.  She states that her pain for the last few months has been in her lower back and radiates into her bilateral legs in the buttock region.  She says the pain will often also radiate into the her labia particular on the left side.  She does not have any pain that radiates down past the knee.  She describes the pain as electric in nature.  Pain has been getting worse with time.  There is no trauma or injury that brought on the pain.   Weakness: Denies Symptoms of imbalance: Yes, has had issues with balance and uses a walker but attributes it to her Parkinson's Paresthesias and numbness: Denies Bowel or bladder incontinence: Denies Saddle anesthesia: Denies  Treatments tried: Physical therapy, activity modification, narcotic pain medication, over-the-counter pain medications  Review of systems: Denies fevers and chills, night sweats, unexplained weight loss, history of cancer, pain that wakes them at night  Past medical history: Migraines Chronic pain Venous insufficiency Hypertension Parkinson's   Allergies: Levofloxacin, amoxicillin, clarithromycin, atropine, fish  Past surgical history:  Cholecystectomy Polypectomy Lumbar laminectomy L3-L5 posterior spinal  fusion and TLIF Tonsillectomy Right total knee arthroplasty Tooth extraction  Social history: Denies use of nicotine product (smoking, vaping, patches, smokeless) Alcohol use: Rare Denies recreational drug use    Physical Exam:  General: no acute distress, appears stated  age Neurologic: alert, answering questions appropriately, following commands Respiratory: unlabored breathing on room air, symmetric chest rise Psychiatric: appropriate affect, normal cadence to speech   MSK (spine):  -Strength exam      Left  Right EHL    5/5  5/5 TA    5/5  5/5 GSC    5/5  5/5 Knee extension  5/5  5/5 Hip flexion   5/5  5/5  -Sensory exam    Sensation intact to light touch in L3-S1 nerve distributions of bilateral lower extremities  -Achilles DTR: 1/4 on the left, 1/4 on the right -Patellar tendon DTR: 1/4 on the left, 1/4 on the right  -Straight leg raise: Negative -Contralateral straight leg raise: Negative -Clonus: no beats bilaterally  -Left hip exam: No pain through range of motion, negative Stinchfield -Right hip exam: No pain through range of motion, negative Stinchfield  -Superficial tenderness to palpation over the lower lumbar spine and bilateral SI joints, negative Faber bilaterally, negative SI compression test bilaterally, negative SI distraction test  -No tenderness or reproduction of symptoms with palpation over the piriformis and ischial bursa  Imaging: XR of the lumbar spine from 07/15/2022 was independently reviewed and interpreted, showing interbody devices at L3/4 and L4/5 and pedicle screws at L3, L4, L5. There is no lucency seen around the screws or interbody devices. Lumbar scoliosis with apex at L2 - measures 28 degrees. Disc height loss at L2/3 and L5/S1.   CT of the lumbar spine from 04/22/2022 shows vacuum disc at L5/S1 and L2/3. On the coronal, it appears the inferior facet of L2 is not present and there is translation at the level of the facet joint. Subtle lucency around the L3 screws. More obvious lucency around the L5 screws.   MRI of the lumbar spine from 05/30/2022 was independently reviewed and interpreted, showing L2/3 lateral recess and central stenosis. Foraminal stenosis at L4/5 and L5/S1. 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    Patient name: Kristin Gilmore Patient MRN: [id number] Date of visit: 07/15/22

## 2022-07-16 ENCOUNTER — Telehealth: Payer: Self-pay | Admitting: Physical Medicine and Rehabilitation

## 2022-07-16 NOTE — Telephone Encounter (Signed)
Pt called requesting a call back. Pt states the voicemail said appt with Dr Ernestina Patches for an injection.Dont see referral or notes in appt file Please call pt at [phone].

## 2022-07-17 ENCOUNTER — Telehealth: Payer: Self-pay | Admitting: Orthopaedic Surgery

## 2022-07-17 ENCOUNTER — Ambulatory Visit (HOSPITAL_COMMUNITY)
Admission: RE | Admit: 2022-07-17 | Discharge: 2022-07-17 | Disposition: A | Discharge status: Home / Self Care | Payer: Medicare Other | Source: Ambulatory Visit | Attending: Orthopaedic Surgery | Admitting: Orthopaedic Surgery

## 2022-07-17 ENCOUNTER — Ambulatory Visit (HOSPITAL_COMMUNITY): Payer: Medicare Other

## 2022-07-17 ENCOUNTER — Encounter (HOSPITAL_COMMUNITY): Payer: Self-pay

## 2022-07-17 DIAGNOSIS — M25511 Pain in right shoulder: Secondary | ICD-10-CM | POA: Insufficient documentation

## 2022-07-17 DIAGNOSIS — Z96651 Presence of right artificial knee joint: Secondary | ICD-10-CM | POA: Insufficient documentation

## 2022-07-17 NOTE — Addendum Note (Signed)
Addended by: Ileene Rubens on: 07/17/2022 09:27 AM   Modules accepted: Orders

## 2022-07-17 NOTE — Addendum Note (Signed)
Addended by: Ileene Rubens on: 07/17/2022 06:15 AM   Modules accepted: Orders

## 2022-07-17 NOTE — Telephone Encounter (Signed)
Patient states she did not go to her appointment today for her knee because she could not lay down without her back hurting. Please call patient to advise [phone]

## 2022-07-17 NOTE — Addendum Note (Signed)
Addended by: Ileene Rubens on: 07/17/2022 02:24 PM   Modules accepted: Orders

## 2022-07-18 ENCOUNTER — Telehealth: Payer: Self-pay

## 2022-07-18 NOTE — Telephone Encounter (Signed)
Referral is in chart. I have called and LM for patient to call us back to schedule ESI

## 2022-07-18 NOTE — Telephone Encounter (Signed)
I spoke with patient. She states that she is in excruciating pain with her knee, back and leg. She went to have her bone scan yesterday, but was told that she should go on home because she could not lay down due to pain. She has a few hydromorphone left, but was told to take the hydrocodone, so that is what she has been doing. She is also using ibuprofen. She states that Dr. Erlinda Hong will have to knock her out to have the bone scan, and that she needs to get it done because her knee is "killing her" today. Advised I would send a message to Dr. Erlinda Hong to see what he would like to do.  Please advise.

## 2022-07-18 NOTE — Telephone Encounter (Signed)
I tried to reach patient to discuss. Voicemail left for patient to return call if needed.

## 2022-07-18 NOTE — Telephone Encounter (Signed)
Patient called and left a VM stating that she is having pain in her shoulder, back, and her right leg.  Stated that she is taking Hydrocodone and Ibuprofen.  Cb# [phone].  Please advise.

## 2022-07-19 ENCOUNTER — Telehealth: Payer: Self-pay | Admitting: Orthopaedic Surgery

## 2022-07-19 ENCOUNTER — Other Ambulatory Visit: Payer: Self-pay | Admitting: Physician Assistant

## 2022-07-19 ENCOUNTER — Telehealth: Payer: Self-pay | Admitting: Surgical

## 2022-07-19 MED ORDER — DIAZEPAM 5 MG PO TABS: 5.0000 mg | ORAL_TABLET | Freq: Once | ORAL | 0 refills | Status: AC

## 2022-07-19 MED ORDER — DIAZEPAM 2 MG PO TABS: ORAL_TABLET | ORAL | 0 refills | Status: DC

## 2022-07-19 NOTE — Telephone Encounter (Signed)
Lvm advising pt.

## 2022-07-19 NOTE — Telephone Encounter (Signed)
Patient called advised she rescheduled her appointment for 07/25/2022 at 12:00 noon.  Patient asked if she can get something called into her pharmacy to knock her out so she can have the text. Patient said she can not lay flat on the hard surface because she can not take the pain. The number to contact patient is [phone]

## 2022-07-19 NOTE — Telephone Encounter (Signed)
I'd say she should go to ER if her pain is severe, no one is available to work her in since clinic is closing for the day

## 2022-07-19 NOTE — Telephone Encounter (Signed)
Spoke with Patient advised her message was left on her answering machine and that I spoke with [place] and she advised per [facility] patient will need to go to the ER if pain is worse. Patient said she will think about it and see if she need to go to the hospital.

## 2022-07-19 NOTE — Telephone Encounter (Signed)
Kristin Gilmore, pt called triage phone. She is a Dr. Erlinda Hong pt. She called in with severe right shoulder pain radiating down to her wrist. She says this has increased in pain over last two days. With no injury noted. She cannot use arm. She says that arm is swollen too. She is worried about her MSSA coming back? She had this previously in this arm she said. No other symptoms, just cannot use her arm. Took hydrocodone just a little bit a go, taking q6h.

## 2022-07-19 NOTE — Telephone Encounter (Signed)
Pt advised she need to have medication for relaxation she advised she is unable to do the MRI scan so she had to reschedule it  she is asking for medication prior to MRI. Please call..[phone]

## 2022-07-19 NOTE — Telephone Encounter (Signed)
Noted  

## 2022-07-19 NOTE — Telephone Encounter (Signed)
Double message. Already sent to lindsey for review

## 2022-07-19 NOTE — Telephone Encounter (Signed)
Double message

## 2022-07-19 NOTE — Telephone Encounter (Signed)
sent 

## 2022-07-19 NOTE — Telephone Encounter (Signed)
Pt informed. She said she will think about going to ER

## 2022-07-19 NOTE — Telephone Encounter (Signed)
Please send in thank you

## 2022-07-19 NOTE — Telephone Encounter (Signed)
Pt called requesting vallum to keep her calm. Pt cancelled her MRI and going to calm and reschedule. Please send to pharmacy on file. Pt phone number is [phone].

## 2022-07-19 NOTE — Telephone Encounter (Signed)
I will send in Valium to help her relax so that she can get the bone scan done

## 2022-07-19 NOTE — Addendum Note (Signed)
Addended by: Azucena Cecil on: 07/19/2022 05:51 PM   Modules accepted: Orders

## 2022-07-19 NOTE — Telephone Encounter (Signed)
LM on home VM to go to ER. Advised her to call me back to let me know she got my message.

## 2022-07-22 ENCOUNTER — Telehealth: Payer: Self-pay | Admitting: Orthopedic Surgery

## 2022-07-22 NOTE — Telephone Encounter (Signed)
I called, voicemail picked up but no information on who phone belongs to. No message left. I will try again.

## 2022-07-22 NOTE — Telephone Encounter (Signed)
Pt called requesting a refill of hydromorphone. She states she only has 6 left. Please call pt at [phone].

## 2022-07-23 NOTE — Telephone Encounter (Signed)
I called, goes straight to voicemail. No information left.

## 2022-07-23 NOTE — Telephone Encounter (Signed)
Lmom advised of Dr. Tawanna Sat message

## 2022-07-25 ENCOUNTER — Encounter (HOSPITAL_COMMUNITY)
Admission: RE | Admit: 2022-07-25 | Discharge: 2022-07-25 | Disposition: A | Discharge status: Home / Self Care | Payer: Medicare Other | Source: Ambulatory Visit | Attending: Orthopaedic Surgery | Admitting: Orthopaedic Surgery

## 2022-07-25 DIAGNOSIS — M25561 Pain in right knee: Secondary | ICD-10-CM | POA: Insufficient documentation

## 2022-07-25 DIAGNOSIS — Z96651 Presence of right artificial knee joint: Secondary | ICD-10-CM | POA: Insufficient documentation

## 2022-07-25 DIAGNOSIS — G8929 Other chronic pain: Secondary | ICD-10-CM | POA: Insufficient documentation

## 2022-07-25 DIAGNOSIS — M25511 Pain in right shoulder: Secondary | ICD-10-CM | POA: Insufficient documentation

## 2022-07-25 MED ORDER — TECHNETIUM TC 99M MEDRONATE IV KIT
20.0000 | PACK | Freq: Once | INTRAVENOUS | Status: AC | PRN
  Administered 2022-07-25: 21 via INTRAVENOUS

## 2022-07-28 ENCOUNTER — Other Ambulatory Visit: Payer: Self-pay | Admitting: Orthopedic Surgery

## 2022-07-29 ENCOUNTER — Ambulatory Visit: Payer: Self-pay

## 2022-07-29 ENCOUNTER — Ambulatory Visit (INDEPENDENT_AMBULATORY_CARE_PROVIDER_SITE_OTHER): Payer: Medicare Other | Admitting: Physical Medicine and Rehabilitation

## 2022-07-29 VITALS — BP 111/40 | HR 64

## 2022-07-29 DIAGNOSIS — M5416 Radiculopathy, lumbar region: Secondary | ICD-10-CM

## 2022-07-29 MED ORDER — METHYLPREDNISOLONE ACETATE 80 MG/ML IJ SUSP
80.0000 mg | Freq: Once | INTRAMUSCULAR | Status: AC
  Administered 2022-07-29: 80 mg

## 2022-07-29 NOTE — Patient Instructions (Signed)

## 2022-07-29 NOTE — Progress Notes (Unsigned)
Numeric Pain Rating Scale and Functional Assessment Average Pain 1   In the last MONTH (on 0-10 scale) has pain interfered with the following?  1. General activity like being  able to carry out your everyday physical activities such as walking, climbing stairs, carrying groceries, or moving a chair?  Rating(7)   +Driver, -BT, -Dye Allergies.  Moving around makes pain worse. Tylenol and Ibuprofen for pain

## 2022-07-30 ENCOUNTER — Telehealth: Payer: Self-pay | Admitting: Orthopaedic Surgery

## 2022-07-30 NOTE — Telephone Encounter (Signed)
Maybe.  I sent her a mychart message about the bone scan

## 2022-07-30 NOTE — Telephone Encounter (Signed)
Pt want her MRI results and she said that she needed an appt for her shoulder. Apt made on 11/14 for shoulder. Please call for results

## 2022-07-31 NOTE — Telephone Encounter (Signed)
Called patient. No answer. LMOM that results were sent to her via [place].

## 2022-08-01 NOTE — Progress Notes (Signed)
Kristin Gilmore - 71 y.o. female MRN [id number]  Date of birth: 1951/03/25  Office Visit Note: Visit Date: 07/29/2022 PCP: Constance Haw, MD Referred by: Callie Fielding, MD  Subjective: Chief Complaint  Patient presents with   Lower Back - Pain   HPI:  Kristin Gilmore is a 71 y.o. female who comes in today at the request of Dr. Ileene Rubens for planned Bilateral L5-S1 Lumbar Transforaminal epidural steroid injection with fluoroscopic guidance.  The patient has failed conservative care including home exercise, medications, time and activity modification.  This injection will be diagnostic and hopefully therapeutic.  Please see requesting physician notes for further details and justification. MRI reviewed with images and spine model.  MRI reviewed in the note below.   Patient with exceeding amount of anxiety about the injection.  I had a long talk with her prior to the procedure and really let her know exactly what was going to happen but she still was perseverating on with this hurt and how would it feel etc.  He can see in the procedure note itself that the patient had a great deal of difficulty with all parts of the injection including just numbing the skin.  We were able to complete the right sided injection but not the left.  She would likely need sedation somewhere to have this done if she needed in the future.  She is probably a better candidate for multifactorial pain management with pain psychology and medication treatment for the anxiety.  She has actually had injections in the past with [place] but did not remember those.   ROS Otherwise per HPI.  Assessment & Plan: Visit Diagnoses:    ICD-10-CM   1. Lumbar radiculopathy  M54.16 XR C-ARM NO REPORT    Epidural Steroid injection    methylPREDNISolone acetate (DEPO-MEDROL) injection 80 mg      Plan: No additional findings.   Meds & Orders:  Meds ordered this encounter  Medications   methylPREDNISolone acetate  (DEPO-MEDROL) injection 80 mg    Orders Placed This Encounter  Procedures   XR C-ARM NO REPORT   Epidural Steroid injection    Follow-up: Return for visit to requesting provider as needed.   Procedures: No procedures performed  Lumbosacral Transforaminal Epidural Steroid Injection - Sub-Pedicular Approach with Fluoroscopic Guidance  Patient: Kristin Gilmore      Date of Birth: 1951/09/13 MRN: [id number] PCP: Constance Haw, MD      Visit Date: 07/29/2022   Universal Protocol:    Date/Time: 07/29/2022  Consent Given By: the patient  Position: PRONE  Additional Comments: Vital signs were monitored before and after the procedure. Patient was prepped and draped in the usual sterile fashion. The correct patient, procedure, and site was verified.   Injection Procedure Details:   Procedure diagnoses: Lumbar radiculopathy [M54.16]    Meds Administered:  Meds ordered this encounter  Medications   methylPREDNISolone acetate (DEPO-MEDROL) injection 80 mg    Laterality: Bilateral  Location/Site: L5  Needle:5.0 in., 22 ga.  Short bevel or Quincke spinal needle  Needle Placement: Transforaminal  Findings:    -Comments: Excellent flow of contrast along the nerve, nerve root and into the epidural space.  As seen in the body of the note today the patient presented with an incredible amount of anxiety towards the injection.  We have seen her in the past for facet joint blocks and she really did not remember having those.  The last time I saw her  was last year.  We were able to complete the injection on the right but she was not able to tolerate any further injection as she was just having a significant amount of pain which I think was anxiety driven as we had no complications and no needling at the nerve root or other nerves.  In the future if she requires an injection I would suggest referral to someone that can do this with sedation.  Procedure Details: After squaring  off the end-plates to get a true AP view, the C-arm was positioned so that an oblique view of the foramen as noted above was visualized. The target area is just inferior to the "nose of the scotty dog" or sub pedicular. The soft tissues overlying this structure were infiltrated with 2-3 ml. of 1% Lidocaine without Epinephrine.  The spinal needle was inserted toward the target using a "trajectory" view along the fluoroscope beam.  Under AP and lateral visualization, the needle was advanced so it did not puncture dura and was located close the 6 O'Clock position of the pedical in AP tracterory. Biplanar projections were used to confirm position. Aspiration was confirmed to be negative for CSF and/or blood. A 1-2 ml. volume of Isovue-250 was injected and flow of contrast was noted at each level. Radiographs were obtained for documentation purposes.   After attaining the desired flow of contrast documented above, a 0.5 to 1.0 ml test dose of 0.25% Marcaine was injected into each respective transforaminal space.  The patient was observed for 90 seconds post injection.  After no sensory deficits were reported, and normal lower extremity motor function was noted,   the above injectate was administered so that equal amounts of the injectate were placed at each foramen (level) into the transforaminal epidural space.   Additional Comments:  No complications occurred Dressing: 2 x 2 sterile gauze and Band-Aid    Post-procedure details: Patient was observed during the procedure. Post-procedure instructions were reviewed.  Patient left the clinic in stable condition.    Clinical History: PER Dr. Fabio Bering patient needs to be seen for her RIGHT Knee- she should be following up with Dr. Erlinda Hong   MRI LUMBAR SPINE WITHOUT AND WITH CONTRAST    CONTRAST:  75m GADAVIST GADOBUTROL 1 MMOL/ML IV SOLN   COMPARISON:  CT lumbar spine 04/22/2022, lumbar spine MRI 07/18/2021   FINDINGS: Segmentation: Standard; the  lowest formed disc space is designated L5-S1.   Alignment: Levocurvature centered at L2 is unchanged. Trace anterolisthesis of L3 on L4 and 5 mm anterolisthesis of L4 on L5 is unchanged.   Vertebrae: Postsurgical changes reflecting posterior instrumented fusion at L3 through L5 with interbody spacers are again seen. Lucency around the L5 screws seen on the prior CT is not appreciated by MRI.   Background marrow signal is within normal limits. There is degenerative endplate marrow signal abnormality with edema but no enhancement at L2-L3 which is increased since the preoperative study from 2022. There is no other marrow edema. There is no abnormal marrow enhancement.   Conus medullaris and cauda equina: Conus extends to the L1 level. Conus and cauda equina appear normal. There is no abnormal enhancement of the conus or cauda equina nerve roots.   Paraspinal and other soft tissues: There are postsurgical changes in the soft tissues posterior to the surgical levels. There is no suspicious fluid collection. The endometrium is distended measuring up to 8 mm in thickness.   Disc levels:   T12-L1: No significant spinal canal or  neural foraminal stenosis   L1-L2: No significant spinal canal or neural foraminal stenosis.   L2-L3: There is disc desiccation and narrowing with a diffuse disc bulge and small superiorly migrated extrusion, and moderate bilateral facet arthropathy resulting in moderate spinal canal stenosis with subarticular zone narrowing and possible irritation of either traversing L3 nerve root, and no significant neural foraminal stenosis. Compared to the preoperative study from 2022, the spinal canal stenosis has worsened.   L3-L4: Status post posterior instrumented fusion and decompression. There is no residual spinal canal stenosis. There is mild residual right worse than left neural foraminal stenosis without convincing evidence of nerve root compression.    L4-L5: Status post posterior instrumented fusion and decompression. There is grade 1 anterolisthesis with uncovering of the disc posteriorly and bilateral facet arthropathy. There is no residual spinal canal stenosis; however, there is severe left and mild-to-moderate right neural foraminal stenosis with compression of the exiting left L4 nerve root (5-11).   L5-S1: There is advanced disc desiccation and narrowing with a disc bulge and central protrusion and annular fissure, moderate bilateral facet arthropathy resulting in severe left worse than right neural foraminal stenosis without significant spinal canal stenosis. There is no evidence of compression of the traversing S1 nerve roots as was questioned on prior CT.   IMPRESSION: 1. Status post posterior instrumented fusion and decompression at L3 through L5 without residual spinal canal stenosis at the surgical levels; however, there is severe left and mild-to-moderate right neural foraminal stenosis at L4-L5 with compression of the exiting left L4 nerve root. No other convincing nerve root compression at the surgical levels. 2. 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 3. Unchanged advanced disc degeneration and moderate facet arthropathy at L5-S1 with severe left worse than right neural foraminal stenosis. No evidence of compression of the traversing S1 nerve roots as was questioned on prior CT. 4. Distended endometrium measuring up to 8 mm. Recommend correlation with pelvic ultrasound given patient age.     Electronically Signed   By: Valetta Mole M.D.   On: 05/30/2022 11:16     Objective:  VS:  HT:    WT:   BMI:     BP:(!) 111/40  HR:64bpm  TEMP: ( )  RESP:  Physical Exam Vitals and nursing note reviewed.   Constitutional:      General: She is not in acute distress.    Appearance: Normal appearance. She is not ill-appearing.  HENT:     Head: Normocephalic and atraumatic.     Right Ear: External ear normal.     Left Ear: External ear normal.  Eyes:     Extraocular Movements: Extraocular movements intact.  Cardiovascular:     Rate and Rhythm: Normal rate.     Pulses: Normal pulses.  Pulmonary:     Effort: Pulmonary effort is normal. No respiratory distress.  Abdominal:     General: There is no distension.     Palpations: Abdomen is soft.  Musculoskeletal:        General: Tenderness present.     Cervical back: Neck supple.     Right lower leg: No edema.     Left lower leg: No edema.     Comments: Patient has good distal strength with no pain over the greater trochanters.  No clonus or focal weakness.  Skin:  Findings: No erythema, lesion or rash.  Neurological:     General: No focal deficit present.     Mental Status: She is alert and oriented to person, place, and time.     Sensory: No sensory deficit.     Motor: No weakness or abnormal muscle tone.     Coordination: Coordination normal.  Psychiatric:        Mood and Affect: Mood normal.        Behavior: Behavior normal.      Imaging: No results found.

## 2022-08-01 NOTE — Procedures (Signed)
Lumbosacral Transforaminal Epidural Steroid Injection - Sub-Pedicular Approach with Fluoroscopic Guidance  Patient: Kristin Noy Moise      Date of Birth: 10/10/1950 MRN: [id number] PCP: Constance Haw, MD      Visit Date: 07/29/2022   Universal Protocol:    Date/Time: 07/29/2022  Consent Given By: the patient  Position: PRONE  Additional Comments: Vital signs were monitored before and after the procedure. Patient was prepped and draped in the usual sterile fashion. The correct patient, procedure, and site was verified.   Injection Procedure Details:   Procedure diagnoses: Lumbar radiculopathy [M54.16]    Meds Administered:  Meds ordered this encounter  Medications   methylPREDNISolone acetate (DEPO-MEDROL) injection 80 mg    Laterality: Bilateral  Location/Site: L5  Needle:5.0 in., 22 ga.  Short bevel or Quincke spinal needle  Needle Placement: Transforaminal  Findings:    -Comments: Excellent flow of contrast along the nerve, nerve root and into the epidural space.  As seen in the body of the note today the patient presented with an incredible amount of anxiety towards the injection.  We have seen her in the past for facet joint blocks and she really did not remember having those.  The last time I saw her was last year.  We were able to complete the injection on the right but she was not able to tolerate any further injection as she was just having a significant amount of pain which I think was anxiety driven as we had no complications and no needling at the nerve root or other nerves.  In the future if she requires an injection I would suggest referral to someone that can do this with sedation.  Procedure Details: After squaring off the end-plates to get a true AP view, the C-arm was positioned so that an oblique view of the foramen as noted above was visualized. The target area is just inferior to the "nose of the scotty dog" or sub pedicular. The soft tissues  overlying this structure were infiltrated with 2-3 ml. of 1% Lidocaine without Epinephrine.  The spinal needle was inserted toward the target using a "trajectory" view along the fluoroscope beam.  Under AP and lateral visualization, the needle was advanced so it did not puncture dura and was located close the 6 O'Clock position of the pedical in AP tracterory. Biplanar projections were used to confirm position. Aspiration was confirmed to be negative for CSF and/or blood. A 1-2 ml. volume of Isovue-250 was injected and flow of contrast was noted at each level. Radiographs were obtained for documentation purposes.   After attaining the desired flow of contrast documented above, a 0.5 to 1.0 ml test dose of 0.25% Marcaine was injected into each respective transforaminal space.  The patient was observed for 90 seconds post injection.  After no sensory deficits were reported, and normal lower extremity motor function was noted,   the above injectate was administered so that equal amounts of the injectate were placed at each foramen (level) into the transforaminal epidural space.   Additional Comments:  No complications occurred Dressing: 2 x 2 sterile gauze and Band-Aid    Post-procedure details: Patient was observed during the procedure. Post-procedure instructions were reviewed.  Patient left the clinic in stable condition.

## 2022-08-06 ENCOUNTER — Telehealth: Payer: Self-pay | Admitting: Psychiatry

## 2022-08-06 NOTE — Telephone Encounter (Signed)
Patient called in for refill on Clonazepam '300mg'$ . States she really needs medication. Ph: [phone] Appt 1/23 Pharmacy [facility] [place] Patient last seen 05/23/21

## 2022-08-07 ENCOUNTER — Other Ambulatory Visit: Payer: Self-pay | Admitting: Radiology

## 2022-08-07 ENCOUNTER — Other Ambulatory Visit: Payer: Self-pay

## 2022-08-07 DIAGNOSIS — G8929 Other chronic pain: Secondary | ICD-10-CM

## 2022-08-07 DIAGNOSIS — M5416 Radiculopathy, lumbar region: Secondary | ICD-10-CM

## 2022-08-07 DIAGNOSIS — G20B1 Parkinson's disease with dyskinesia, without mention of fluctuations: Secondary | ICD-10-CM

## 2022-08-07 DIAGNOSIS — M48062 Spinal stenosis, lumbar region with neurogenic claudication: Secondary | ICD-10-CM

## 2022-08-07 DIAGNOSIS — F3171 Bipolar disorder, in partial remission, most recent episode hypomanic: Secondary | ICD-10-CM

## 2022-08-07 DIAGNOSIS — Z981 Arthrodesis status: Secondary | ICD-10-CM

## 2022-08-07 DIAGNOSIS — M4156 Other secondary scoliosis, lumbar region: Secondary | ICD-10-CM

## 2022-08-07 DIAGNOSIS — M4726 Other spondylosis with radiculopathy, lumbar region: Secondary | ICD-10-CM

## 2022-08-07 DIAGNOSIS — M4316 Spondylolisthesis, lumbar region: Secondary | ICD-10-CM

## 2022-08-07 DIAGNOSIS — M5136 Other intervertebral disc degeneration, lumbar region: Secondary | ICD-10-CM

## 2022-08-07 DIAGNOSIS — M51369 Other intervertebral disc degeneration, lumbar region without mention of lumbar back pain or lower extremity pain: Secondary | ICD-10-CM

## 2022-08-07 MED ORDER — QUETIAPINE FUMARATE 300 MG PO TABS: 150.0000 mg | ORAL_TABLET | Freq: Every evening | ORAL | 1 refills | Status: DC | PRN

## 2022-08-07 NOTE — Telephone Encounter (Signed)
LVM to rtc 

## 2022-08-07 NOTE — Telephone Encounter (Signed)
Pt needed Seroquel sent.Sent rx

## 2022-08-08 ENCOUNTER — Other Ambulatory Visit (HOSPITAL_BASED_OUTPATIENT_CLINIC_OR_DEPARTMENT_OTHER): Payer: Self-pay | Admitting: Gastroenterology

## 2022-08-08 DIAGNOSIS — R1033 Periumbilical pain: Secondary | ICD-10-CM

## 2022-08-12 ENCOUNTER — Ambulatory Visit: Payer: Medicare Other | Admitting: Orthopedic Surgery

## 2022-08-12 ENCOUNTER — Other Ambulatory Visit (HOSPITAL_BASED_OUTPATIENT_CLINIC_OR_DEPARTMENT_OTHER): Payer: Self-pay | Admitting: Gastroenterology

## 2022-08-12 DIAGNOSIS — R1033 Periumbilical pain: Secondary | ICD-10-CM

## 2022-08-13 ENCOUNTER — Encounter: Payer: Self-pay | Admitting: Orthopaedic Surgery

## 2022-08-13 ENCOUNTER — Ambulatory Visit (INDEPENDENT_AMBULATORY_CARE_PROVIDER_SITE_OTHER): Payer: Medicare Other | Admitting: Orthopaedic Surgery

## 2022-08-13 DIAGNOSIS — G8929 Other chronic pain: Secondary | ICD-10-CM | POA: Diagnosis not present

## 2022-08-13 DIAGNOSIS — M25511 Pain in right shoulder: Secondary | ICD-10-CM

## 2022-08-13 DIAGNOSIS — Z96651 Presence of right artificial knee joint: Secondary | ICD-10-CM | POA: Diagnosis not present

## 2022-08-13 NOTE — Progress Notes (Signed)
Office Visit Note   Patient: Kristin Gilmore           Date of Birth: [birth year]           MRN: [id number] Visit Date: 08/13/2022              Requested by: Constance Haw, [address] [place],  [place] [address] PCP: Constance Haw, MD   Assessment & Plan: Visit Diagnoses:  1. Status post total right knee replacement   2. Chronic right shoulder pain     Plan: Bone scan is negative for any abnormalities or problems with knee replacement.  These results were reviewed with her in detail.  In regards to the right shoulder difficult to say exactly what is going on as patient does have chronic pain syndrome.  Functionally speaking her shoulder is doing well.  Impression is osteoarthritis and based on her age would expect mild tendinitis of the rotator cuff.  I think the best thing to do is for her to follow-up with Dr. Rolena Infante for ultrasound-guided intra-articular injection.  She will make an appointment.  Follow-up with me as needed.  Follow-Up Instructions: No follow-ups on file.   Orders:  No orders of the defined types were placed in this encounter.  No orders of the defined types were placed in this encounter.     Procedures: No procedures performed   Clinical Data: No additional findings.   Subjective: Chief Complaint  Patient presents with   Right Shoulder - Pain   Right Knee - Follow-up    Bone Scan Review    HPI Alnita returns today to discuss right knee bone scan and follow-up for right shoulder pain.  Review of Systems  Constitutional: Negative.   HENT: Negative.    Eyes: Negative.   Respiratory: Negative.    Cardiovascular: Negative.   Endocrine: Negative.   Musculoskeletal: Negative.   Neurological: Negative.   Hematological: Negative.   Psychiatric/Behavioral: Negative.    All other systems reviewed and are negative.    Objective: Vital Signs: There were no vitals taken for this visit.  Physical Exam Vitals and  nursing note reviewed.  Constitutional:      Appearance: She is well-developed.  Pulmonary:     Effort: Pulmonary effort is normal.  Skin:    General: Skin is warm.     Capillary Refill: Capillary refill takes less than 2 seconds.  Neurological:     Mental Status: She is alert and oriented to person, place, and time.  Psychiatric:        Behavior: Behavior normal.        Thought Content: Thought content normal.        Judgment: Judgment normal.     Ortho Exam Examination of the right knee is unchanged. Examination of right shoulder shows normal passive and active range of motion.  She has good strength to manual muscle testing of the rotator cuff.  She reports pain inside the shoulder with active range of motion. Specialty Comments:  PER Dr. Fabio Bering patient needs to be seen for her RIGHT Knee- she should be following up with Dr. Erlinda Hong   MRI LUMBAR SPINE WITHOUT AND WITH CONTRAST    CONTRAST:  36m GADAVIST GADOBUTROL 1 MMOL/ML IV SOLN   COMPARISON:  CT lumbar spine 04/22/2022, lumbar spine MRI 07/18/2021   FINDINGS: Segmentation: Standard; the lowest formed disc space is designated L5-S1.   Alignment: Levocurvature centered at L2 is unchanged. Trace anterolisthesis of  L3 on L4 and 5 mm anterolisthesis of L4 on L5 is unchanged.   Vertebrae: Postsurgical changes reflecting posterior instrumented fusion at L3 through L5 with interbody spacers are again seen. Lucency around the L5 screws seen on the prior CT is not appreciated by MRI.   Background marrow signal is within normal limits. There is degenerative endplate marrow signal abnormality with edema but no enhancement at L2-L3 which is increased since the preoperative study from 2022. There is no other marrow edema. There is no abnormal marrow enhancement.   Conus medullaris and cauda equina: Conus extends to the L1 level. Conus and cauda equina appear normal. There is no abnormal enhancement of the conus or cauda  equina nerve roots.   Paraspinal and other soft tissues: There are postsurgical changes in the soft tissues posterior to the surgical levels. There is no suspicious fluid collection. The endometrium is distended measuring up to 8 mm in thickness.   Disc levels:   T12-L1: No significant spinal canal or neural foraminal stenosis   L1-L2: No significant spinal canal or neural foraminal stenosis.   L2-L3: There is disc desiccation and narrowing with a diffuse disc bulge and small superiorly migrated extrusion, and moderate bilateral facet arthropathy resulting in moderate spinal canal stenosis with subarticular zone narrowing and possible irritation of either traversing L3 nerve root, and no significant neural foraminal stenosis. Compared to the preoperative study from 2022, the spinal canal stenosis has worsened.   L3-L4: Status post posterior instrumented fusion and decompression. There is no residual spinal canal stenosis. There is mild residual right worse than left neural foraminal stenosis without convincing evidence of nerve root compression.   L4-L5: Status post posterior instrumented fusion and decompression. There is grade 1 anterolisthesis with uncovering of the disc posteriorly and bilateral facet arthropathy. There is no residual spinal canal stenosis; however, there is severe left and mild-to-moderate right neural foraminal stenosis with compression of the exiting left L4 nerve root (5-11).   L5-S1: There is advanced disc desiccation and narrowing with a disc bulge and central protrusion and annular fissure, moderate bilateral facet arthropathy resulting in severe left worse than right neural foraminal stenosis without significant spinal canal stenosis. There is no evidence of compression of the traversing S1 nerve roots as was questioned on prior CT.   IMPRESSION: 1. Status post posterior instrumented fusion and decompression at L3 through L5 without residual  spinal canal stenosis at the surgical levels; however, there is severe left and mild-to-moderate right neural foraminal stenosis at L4-L5 with compression of the exiting left L4 nerve root. No other convincing nerve root compression at the surgical levels. 2. 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 3. Unchanged advanced disc degeneration and moderate facet arthropathy at L5-S1 with severe left worse than right neural foraminal stenosis. No evidence of compression of the traversing S1 nerve roots as was questioned on prior CT. 4. Distended endometrium measuring up to 8 mm. Recommend correlation with pelvic ultrasound given patient age.     Electronically Signed   By: Valetta Mole M.D.   On: 05/30/2022 11:16  Imaging: No results found.   PMFS History: Patient Active Problem List   Diagnosis Date Noted   Acute pain of right shoulder 07/02/2022   Lumbar radicular pain    AKI (acute kidney injury) ([place])    Bursitis of right elbow  Bacteremia due to methicillin susceptible Staphylococcus aureus (MSSA) 05/29/2022   SIRS (systemic inflammatory response syndrome) ([place]) 05/28/2022   Bipolar disorder, in partial remission, most recent episode hypomanic ([place]) 05/28/2022   Hyperkalemia 05/28/2022   Dehydration 05/28/2022   Essential hypertension 05/28/2022   Intractable back pain 05/27/2022   Other spondylosis with radiculopathy, lumbar region 10/30/2021   Other secondary scoliosis, lumbar region 10/30/2021   Spondylolisthesis, lumbar region 10/30/2021   Spondylolisthesis of lumbosacral region 10/30/2021   Status post total right knee replacement 06/26/2021   Primary osteoarthritis of right knee 06/25/2021   Parkinson's disease 01/24/2021   Trochanteric bursitis, left hip 01/18/2020    Lumbar spinal stenosis 12/19/2017   Pain in joint, ankle and foot 05/02/2016   Chronic venous insufficiency 05/02/2016   Peripheral edema 11/01/2015   Intractable chronic migraine without aura 11/02/2014   Tremor 04/13/2014   Migraine without aura 04/13/2014   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   Past Medical History:  Diagnosis Date   Arthritis    Complication of anesthesia    "It didn't knock me out quick enough."   Depression    Dyslipidemia    Hypertension    Migraine    Migraine without aura, without mention of intractable migraine without mention of status migrainosus 04/13/2014   Parkinson's disease 01/24/2021   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   Pneumonia    Pre-diabetes    "i was told i was pre-diabetic a while ago"   Tremor 04/13/2014    Family History  Problem Relation Age of Onset   Cancer Father        stomach cancer   Migraines Mother     Past Surgical History:  Procedure Laterality Date   CHOLECYSTECTOMY     COLONOSCOPY W/ POLYPECTOMY     LUMBAR LAMINECTOMY  07/2020   3 level decompression - [place], [place]   LUMBAR LAMINECTOMY/DECOMPRESSION MICRODISCECTOMY N/A 12/19/2017   Procedure: LAMINECTOMY LUMBAR TWO- LUMBAR THREE, LUMBAR THREE- LUMBAR FOUR, LUMBAR FOUR- LUMBAR FIVE ;  Surgeon: Consuella Lose, MD;  Location: [facility];  Service: Neurosurgery;  Laterality: N/A;   NASAL SINUS SURGERY     RADIOLOGY WITH ANESTHESIA N/A 05/30/2022   Procedure: MRI WITH ANESTHESIA LUMBAR SPINE W/WO CONSTRAST;  Surgeon: Radiologist, Medication, MD;  Location: [facility];  Service: Radiology;  Laterality: N/A;   TEE WITHOUT CARDIOVERSION N/A 06/05/2022   Procedure: TRANSESOPHAGEAL ECHOCARDIOGRAM (TEE);  Surgeon: Pixie Casino, MD;  [facility];  Service: Cardiovascular;  Laterality: N/A;   TONSILLECTOMY     TOTAL KNEE ARTHROPLASTY Right 06/26/2021   Procedure: RIGHT TOTAL KNEE ARTHROPLASTY;  Surgeon: Leandrew Koyanagi, MD;  Location: [facility];  Service: Orthopedics;   Laterality: Right;   WISDOM TOOTH EXTRACTION     Social History   Occupational History   Occupation: Retired  Tobacco Use   Smoking status: Never   Smokeless tobacco: Never  Vaping Use   Vaping Use: Never used  Substance and Sexual Activity   Alcohol use: Yes    Comment: occasional glass of wine   Drug use: No   Sexual activity: Not on file

## 2022-08-21 ENCOUNTER — Telehealth: Payer: Self-pay | Admitting: Orthopedic Surgery

## 2022-08-21 NOTE — Telephone Encounter (Signed)
I called and Lmom for pt that Dr. Laurance Flatten only refilled that medicine 1 time and advised her then that it would only be the one time, till she could get in with Neurology, which she had an appointment with on 08/14/22. I advised that the on-call MD at that office could refill this medication and that Dr. Laurance Flatten will not be refilling it at this time.

## 2022-08-21 NOTE — Telephone Encounter (Signed)
Patient called in stating she needs a refill on  trihexyphenidyl (ARTANE) 2 MG tablet  she cannot get it filled by her other Dr being he is out of office

## 2022-08-23 ENCOUNTER — Ambulatory Visit (HOSPITAL_COMMUNITY)
Admission: RE | Admit: 2022-08-23 | Discharge: 2022-08-23 | Disposition: A | Discharge status: Home / Self Care | Payer: Medicare Other | Source: Ambulatory Visit | Attending: Gastroenterology | Admitting: Gastroenterology

## 2022-08-23 DIAGNOSIS — R1033 Periumbilical pain: Secondary | ICD-10-CM | POA: Diagnosis present

## 2022-08-23 MED ORDER — IOHEXOL 350 MG/ML SOLN
75.0000 mL | Freq: Once | INTRAVENOUS | Status: AC | PRN
  Administered 2022-08-23: 75 mL via INTRAVENOUS

## 2022-09-09 IMAGING — CR DG CHEST 2V
2 series · 2 of 2 positions shown · non-contrast
Comparison: Chest x-ray dated November 01, 2021

CLINICAL DATA: Shortness of breath.

EXAM:
CHEST - 2 VIEW

[w chest lat]
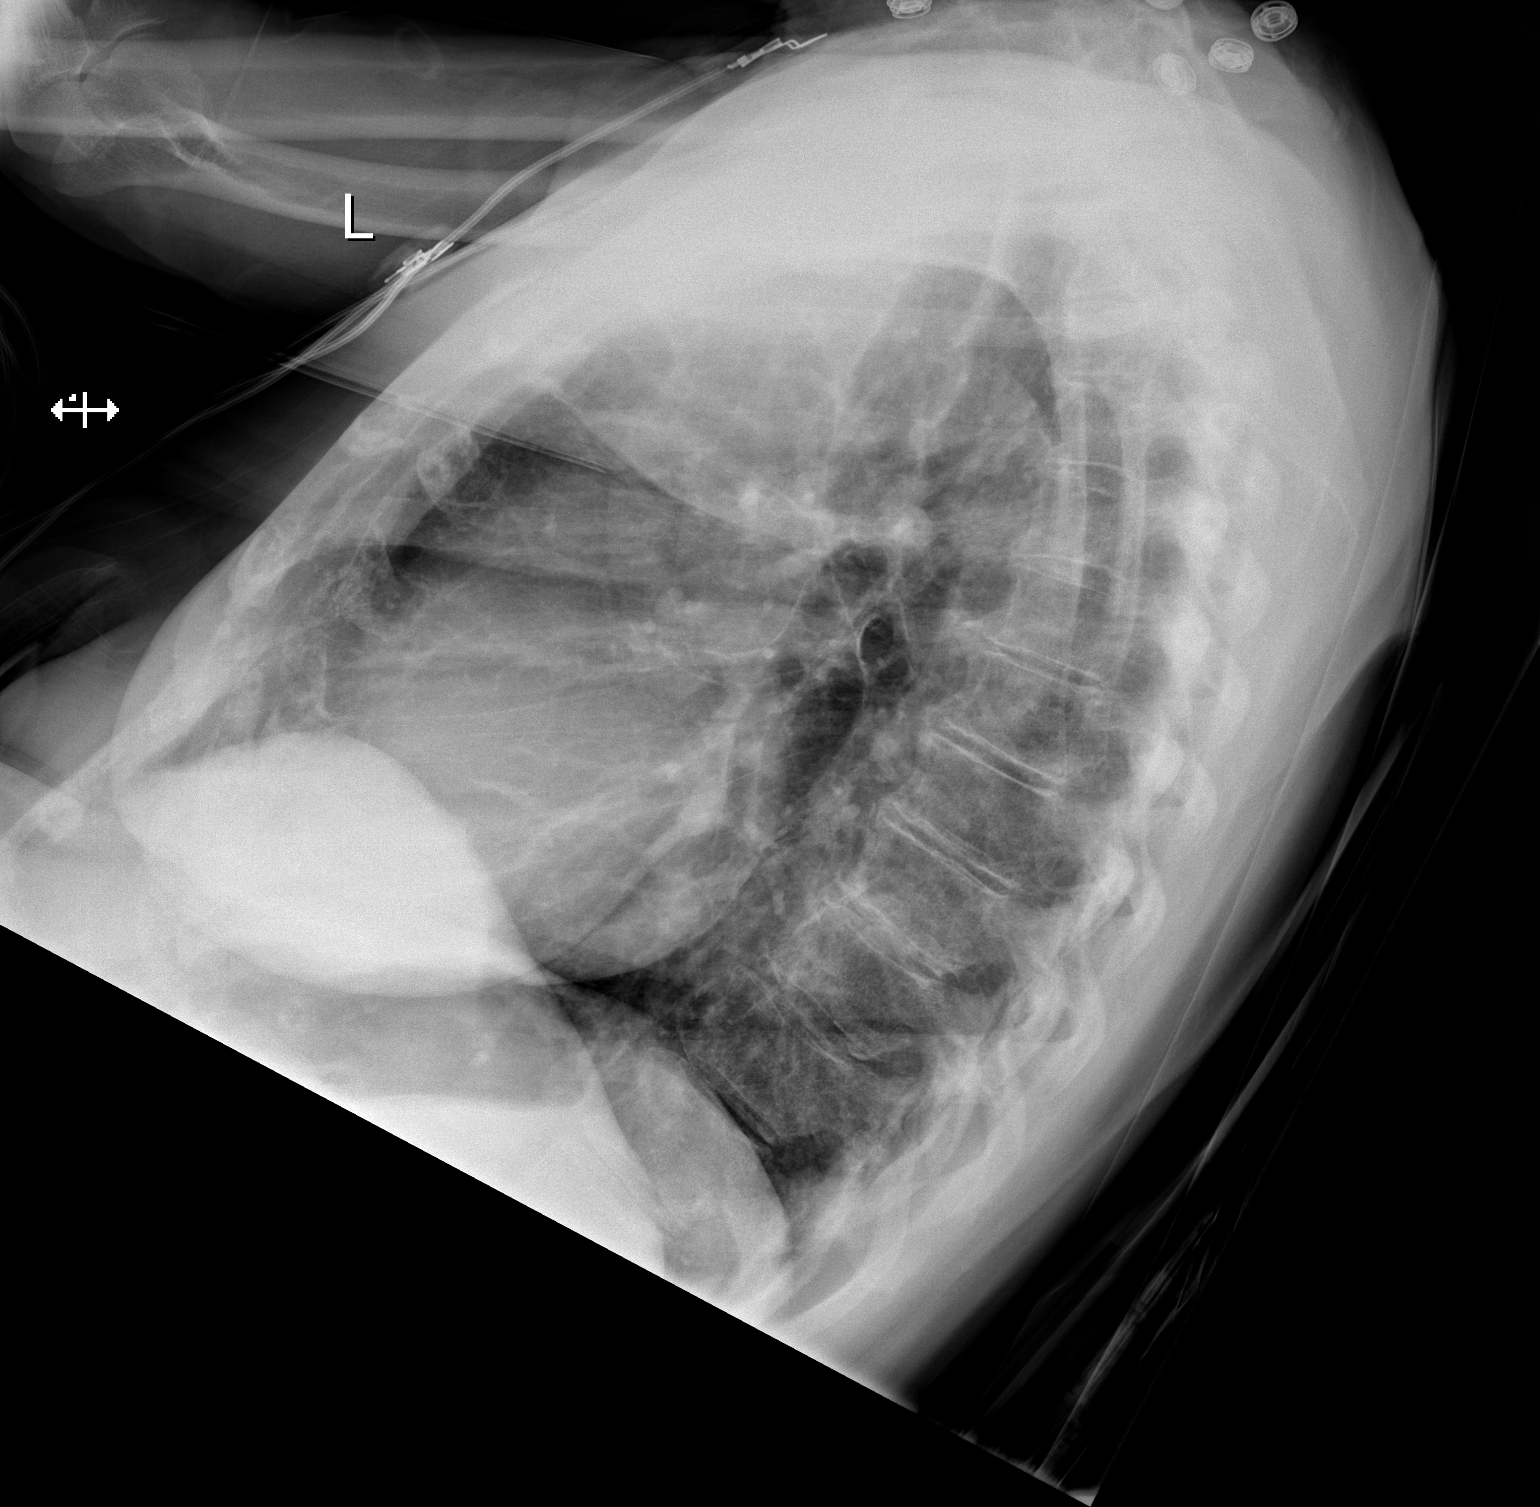

[x chest ap]
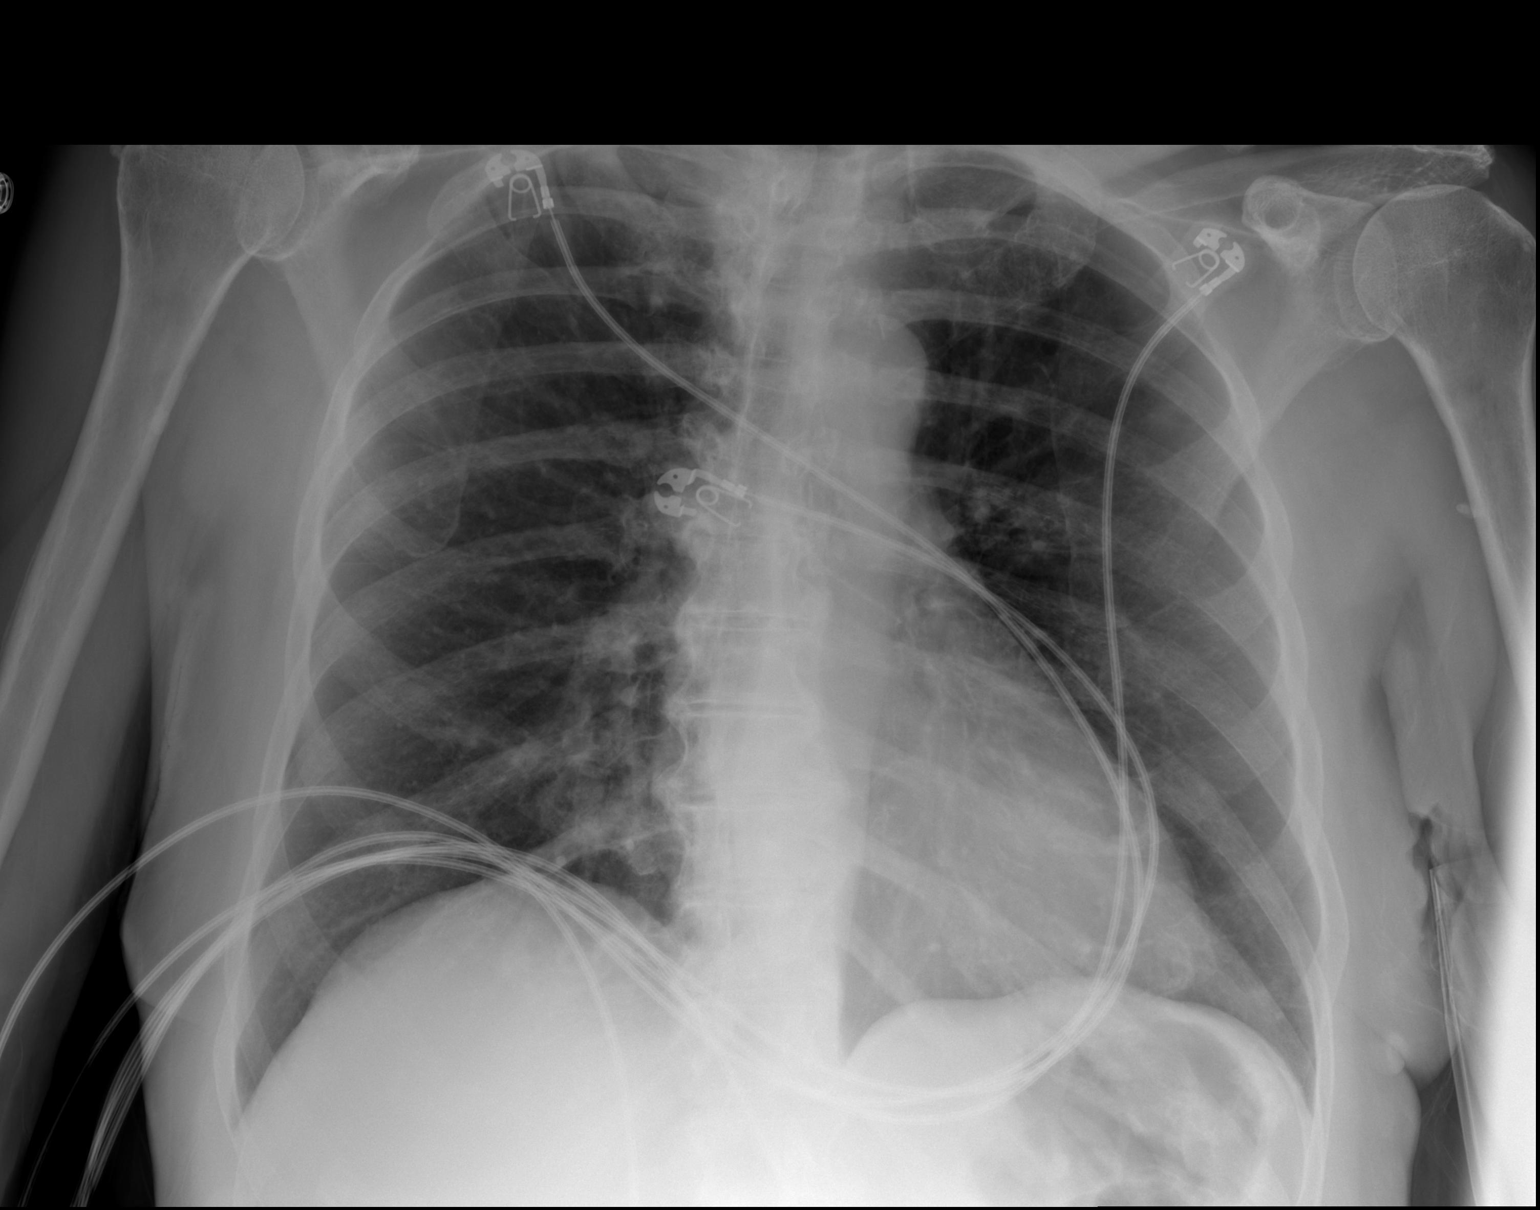

[2 of 2 positions shown; findings below may reference images not displayed]

FINDINGS: Cardiac and mediastinal contours are within normal limits. Inferior
costophrenic angles are excluded from the field of view. No large
pleural effusion or evidence of pneumothorax.
IMPRESSION: No active cardiopulmonary disease.

## 2022-09-25 ENCOUNTER — Telehealth: Payer: Self-pay | Admitting: Orthopedic Surgery

## 2022-09-25 ENCOUNTER — Other Ambulatory Visit: Payer: Self-pay | Admitting: Sports Medicine

## 2022-09-25 DIAGNOSIS — R2231 Localized swelling, mass and lump, right upper limb: Secondary | ICD-10-CM

## 2022-09-25 NOTE — Telephone Encounter (Signed)
I called and lmom-advised that she needs to get this from her PCP or Cardiologist, this is not a medications that needs to be refilled by an orthopedics provider and that Dr. Laurance Flatten will not refill this for her.

## 2022-09-25 NOTE — Telephone Encounter (Signed)
Patient called in asking if we can refill carvedilol (COREG) 25 MG tablet  stated we have done it for her before

## 2022-10-01 ENCOUNTER — Ambulatory Visit: Payer: Medicare Other | Admitting: Physical Therapy

## 2022-10-03 ENCOUNTER — Ambulatory Visit: Payer: Medicare Other | Admitting: Physical Therapy

## 2022-10-03 ENCOUNTER — Ambulatory Visit (INDEPENDENT_AMBULATORY_CARE_PROVIDER_SITE_OTHER): Payer: Medicare Other | Admitting: Physical Therapy

## 2022-10-03 ENCOUNTER — Encounter: Payer: Self-pay | Admitting: Physical Therapy

## 2022-10-03 DIAGNOSIS — M25511 Pain in right shoulder: Secondary | ICD-10-CM

## 2022-10-03 DIAGNOSIS — M25661 Stiffness of right knee, not elsewhere classified: Secondary | ICD-10-CM

## 2022-10-03 DIAGNOSIS — R2689 Other abnormalities of gait and mobility: Secondary | ICD-10-CM

## 2022-10-03 NOTE — Therapy (Signed)
OUTPATIENT PHYSICAL THERAPY SHOULDER EVALUATION   Patient Name: Kristin Gilmore MRN: [id number] DOB:1951/03/22, 72 y.o., female Today's Date: 10/03/2022  END OF SESSION:  PT End of Session - 10/03/22 1648     Visit Number 1    Number of Visits 16    Date for PT Re-Evaluation 11/28/22    Authorization Type Medicare    PT Start Time 1433    PT Stop Time 1520    PT Time Calculation (min) 47 min    Activity Tolerance Patient tolerated treatment well    Behavior During Therapy [facility] for tasks assessed/performed             Past Medical History:  Diagnosis Date   Arthritis    Complication of anesthesia    "It didn't knock me out quick enough."   Depression    Dyslipidemia    Hypertension    Migraine    Migraine without aura, without mention of intractable migraine without mention of status migrainosus 04/13/2014   Parkinson's disease 01/24/2021   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   Pneumonia    Pre-diabetes    "i was told i was pre-diabetic a while ago"   Tremor 04/13/2014   Past Surgical History:  Procedure Laterality Date   CHOLECYSTECTOMY     COLONOSCOPY W/ POLYPECTOMY     LUMBAR LAMINECTOMY  07/2020   3 level decompression - [place], [place]   LUMBAR LAMINECTOMY/DECOMPRESSION MICRODISCECTOMY N/A 12/19/2017   Procedure: LAMINECTOMY LUMBAR TWO- LUMBAR THREE, LUMBAR THREE- LUMBAR FOUR, LUMBAR FOUR- LUMBAR FIVE ;  Surgeon: Consuella Lose, MD;  Location: [facility];  Service: Neurosurgery;  Laterality: N/A;   NASAL SINUS SURGERY     RADIOLOGY WITH ANESTHESIA N/A 05/30/2022   Procedure: MRI WITH ANESTHESIA LUMBAR SPINE W/WO CONSTRAST;  Surgeon: Radiologist, Medication, MD;  Location: [place] [facility] [facility];  Service: Radiology;  Laterality: N/A;   TEE WITHOUT CARDIOVERSION N/A 06/05/2022   Procedure: TRANSESOPHAGEAL ECHOCARDIOGRAM (TEE);  Surgeon: Pixie Casino, MD;  Location: [facility] ENDOSCOPY;  Service: Cardiovascular;  Laterality: N/A;   TONSILLECTOMY     TOTAL KNEE ARTHROPLASTY Right  06/26/2021   Procedure: RIGHT TOTAL KNEE ARTHROPLASTY;  Surgeon: Leandrew Koyanagi, MD;  Location: [facility];  Service: Orthopedics;  Laterality: Right;   WISDOM TOOTH EXTRACTION     Patient Active Problem List   Diagnosis Date Noted   Acute pain of right shoulder 07/02/2022   Lumbar radicular pain    AKI (acute kidney injury) ([place])    Bursitis of right elbow    Bacteremia due to methicillin susceptible Staphylococcus aureus (MSSA) 05/29/2022   SIRS (systemic inflammatory response syndrome) ([place]) 05/28/2022   Bipolar disorder, in partial remission, most recent episode hypomanic ([place]) 05/28/2022   Hyperkalemia 05/28/2022   Dehydration 05/28/2022   Essential hypertension 05/28/2022   Intractable back pain 05/27/2022   Other spondylosis with radiculopathy, lumbar region 10/30/2021   Other secondary scoliosis, lumbar region 10/30/2021   Spondylolisthesis, lumbar region 10/30/2021   Spondylolisthesis of lumbosacral region 10/30/2021   Status post total right knee replacement 06/26/2021   Primary osteoarthritis of right knee 06/25/2021   Parkinson's disease 01/24/2021   Trochanteric bursitis, left hip 01/18/2020   Lumbar spinal stenosis 12/19/2017   Pain in joint, ankle and foot 05/02/2016   Chronic venous insufficiency 05/02/2016   Peripheral edema 11/01/2015   Intractable chronic migraine without aura 11/02/2014   Tremor 04/13/2014   Migraine without aura 04/13/2014   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   THERAPY DIAG:  Acute pain of right shoulder  Stiffness of right knee  Other abnormalities of gait and mobility  Rationale for Evaluation and Treatment: Rehabilitation  ONSET DATE: September 2023  SUBJECTIVE:                                                                                                                                                                                       SUBJECTIVE STATEMENT: Pt with main complaint of R shoulder pain. She is R handed. Had sepsis in R elbow in the last 6 months, thinks shoulder pain started around that time. She also notes no pain in elbow, but tingling into fingers 1-3 in R hand more recently. Pain in shoulder is in anterior shoulder, with limited reaching and elevation ability. She also has parkinsons, with significant tremor of hands. She also is having MRI in next couple weeks for mass in R upper arm that was found on recent x-ray.  She states "stiffness" in legs, despite doing several LE exercises daily. She had R knee replacement. R more stiff than L. She also had polio as a child, and has mild LE asymmetry from this.  She states she want to work on her posture She has an aide that helps her get to appts/trasnportation, does laundry, grocery shopping. Pt does own bathing and dressing. Uses 4 WW at all times.    PERTINENT HISTORY: Parkinsons, previous back surgery, previous R TKA .   PAIN:  Are you having pain? Yes: NPRS scale: 5-6/10 Pain location: R shoulder  Pain description: pain Aggravating factors: elevation, increased use  Relieving factors: none stated/ rest   PRECAUTIONS: None  WEIGHT BEARING RESTRICTIONS: No  FALLS:  Has patient fallen in last 6 months? No   PLOF: Independent and Needs assistance with homemaking  PATIENT GOALS: Decreased pain in shoulder, decreased stiffness in Legs, improve posture.    OBJECTIVE:   DIAGNOSTIC FINDINGS:    PATIENT SURVEYS:  FOTO: score: 48 eval  COGNITION: Overall cognitive status: Within functional limits for tasks assessed    POSTURE: sits with feet up off floor, increased tremors in hands and feet.     ROM:   LE: knees; L: flexion: wnl, ext: - 20                    R: flexion: wnl, ext: - 20   Active ROM Right eval Left eval  Shoulder flexion 110 125  Shoulder extension    Shoulder abduction 45   Shoulder adduction    Shoulder  internal rotation wfl   Shoulder external  rotation wfl   Elbow flexion wfl   Elbow extension wfl   Wrist flexion    Wrist extension    Wrist ulnar deviation    Wrist radial deviation    Wrist pronation    Wrist supination    (Blank rows = not tested)  UPPER EXTREMITY MMT:  LE: hips: 4-/5,  Knees: 4/5   MMT Right eval Left eval  Shoulder flexion 3-   Shoulder extension    Shoulder abduction 3-   Shoulder adduction    Shoulder internal rotation 4-   Shoulder external rotation 4-   Middle trapezius    Lower trapezius    Elbow flexion 4   Elbow extension 4   Wrist flexion    Wrist extension    Wrist ulnar deviation    Wrist radial deviation    Wrist pronation    Wrist supination    Grip strength (lbs)    (Blank rows = not tested)  JOINT MOBILITY TESTING:  Mild hypomobility of R shoulder   PALPATION:  Pain in R bicep/proximal, and bicep groove,  large mass in lateral upper arm non painful.  Hypomobile knee extension, likely from parkinson's and some contracture.    TODAY'S TREATMENT:                                                                                                                                         DATE:   10/04/2022 See below for HEP  PATIENT EDUCATION: Education details: PT POC, Exam findings, Education in great detail on initial HEP , 2 exercises for shoulder,  Person educated: Patient Education method: Explanation, Demonstration, Tactile cues, Verbal cues, and Handouts Education comprehension: verbalized understanding, returned demonstration, verbal cues required, tactile cues required, and needs further education Requires max cuing for exercises.   HOME EXERCISE PROGRAM: Access Code: [email or web address] URL: [email or web address] Date: 10/04/2022 Prepared by: Lyndee Hensen  Exercises - Supine Pectoralis Stretch  - 1 x daily - 3 reps - 30 hold - Supine Shoulder Flexion AAROM with Hands Clasped  - 1 x daily - 1 sets - 10 reps - 5  hold - Seated Scapular Retraction  - 1 x daily - 1-2 sets - 10 reps  ASSESSMENT:  CLINICAL IMPRESSION: Patient    OBJECTIVE IMPAIRMENTS: Abnormal gait, decreased activity tolerance, decreased knowledge of use of DME, decreased mobility, decreased ROM, decreased strength, hypomobility, increased muscle spasms, impaired flexibility, impaired UE functional use, improper body mechanics, and pain.   ACTIVITY LIMITATIONS: carrying, lifting, stairs, self feeding, reach over head, and locomotion level  PARTICIPATION LIMITATIONS: meal prep, cleaning, shopping, and community activity  PERSONAL FACTORS: Time since onset of injury/illness/exacerbation and 1-2 comorbidities: parkinson's, mass in R arm,   are also affecting patient's functional outcome.   REHAB POTENTIAL: Good  CLINICAL DECISION MAKING: Evolving/moderate complexity  EVALUATION COMPLEXITY: Moderate   GOALS: Goals reviewed with patient? Yes   SHORT TERM  GOALS: Target date: 10/24/2022    Pt to be independent with initial HEP for UE and LEs.   Goal status: INITIAL  2.  Pt to be independent with mobility for parkinsons and understand parkinsons implications for mobility.   Goal status: INITIAL     LONG TERM GOALS: Target date: 11/28/2022   Pt to be independent with final HEP for arm, LEs and parkinsons.   Goal status: INITIAL  2.  Pt to demo improved AROM /elevation for R shoulder to be equal to L, to improve ability for ADLS and IADLS.   Goal status: INITIAL  3.  Pt to demo improved strength of R UE to be at least 4/5, to improve ability for reach, lift, carry and IADLS.   Goal status: INITIAL  4.  Pt to demo improved strength of Bil knees to at least 4+/5 to improve ability for stairs , gait , and IADLs.   Goal status: INITIAL  5.  Pt to demo gait mechanics to be safe and WNL for pt age and diagnosis, to improve ability and safety with community navigation.   Goal status: INITIAL    PLAN:  PT  FREQUENCY: 1-2x/week  PT DURATION: 8 weeks  PLANNED INTERVENTIONS: Therapeutic exercises, Therapeutic activity, Neuromuscular re-education, Balance training, Gait training, Patient/Family education, Self Care, Joint mobilization, Joint manipulation, Stair training, DME instructions, Aquatic Therapy, Dry Needling, Electrical stimulation, Spinal manipulation, Spinal mobilization, Cryotherapy, Moist heat, Taping, Vasopneumatic device, Traction, Ultrasound, Ionotophoresis '4mg'$ /ml Dexamethasone, and Manual therapy  PLAN FOR NEXT SESSION:  UE arm pain, nerve glides, wrist extension stretches, education on parkinson's, LE mobility, stretching, LTR, HSS, knee extension mobility.    Lyndee Hensen, PT, DPT 10:33 AM  10/04/22

## 2022-10-07 DIAGNOSIS — H524 Presbyopia: Secondary | ICD-10-CM | POA: Diagnosis not present

## 2022-10-10 ENCOUNTER — Encounter: Payer: Medicare Other | Admitting: Physical Therapy

## 2022-10-15 ENCOUNTER — Ambulatory Visit
Admission: RE | Admit: 2022-10-15 | Discharge: 2022-10-15 | Disposition: A | Discharge status: Home / Self Care | Payer: Medicare Other | Source: Ambulatory Visit | Attending: Sports Medicine | Admitting: Sports Medicine

## 2022-10-15 DIAGNOSIS — R2231 Localized swelling, mass and lump, right upper limb: Secondary | ICD-10-CM

## 2022-10-15 DIAGNOSIS — D1721 Benign lipomatous neoplasm of skin and subcutaneous tissue of right arm: Secondary | ICD-10-CM | POA: Diagnosis not present

## 2022-10-15 MED ORDER — GADOPICLENOL 0.5 MMOL/ML IV SOLN
7.0000 mL | Freq: Once | INTRAVENOUS | Status: AC | PRN
  Administered 2022-10-15: 7 mL via INTRAVENOUS

## 2022-10-17 ENCOUNTER — Ambulatory Visit (INDEPENDENT_AMBULATORY_CARE_PROVIDER_SITE_OTHER): Payer: Medicare Other | Admitting: Physical Therapy

## 2022-10-17 DIAGNOSIS — M25511 Pain in right shoulder: Secondary | ICD-10-CM | POA: Diagnosis not present

## 2022-10-17 DIAGNOSIS — M25661 Stiffness of right knee, not elsewhere classified: Secondary | ICD-10-CM

## 2022-10-17 DIAGNOSIS — R2689 Other abnormalities of gait and mobility: Secondary | ICD-10-CM

## 2022-10-17 NOTE — Therapy (Signed)
OUTPATIENT PHYSICAL THERAPY TREATMENT   Patient Name: Kristin Gilmore MRN: [id number] DOB:[birth year], 72 y.o., female Today's Date: 10/17/2022  END OF SESSION:  PT End of Session - 10/18/22 1009     Visit Number 2    Number of Visits 16    Date for PT Re-Evaluation 11/28/22    Authorization Type Medicare    PT Start Time 1430    PT Stop Time 1515    PT Time Calculation (min) 45 min    Activity Tolerance Patient tolerated treatment well    Behavior During Therapy [facility] for tasks assessed/performed              Past Medical History:  Diagnosis Date   Arthritis    Complication of anesthesia    "It didn't knock me out quick enough."   Depression    Dyslipidemia    Hypertension    Migraine    Migraine without aura, without mention of intractable migraine without mention of status migrainosus 04/13/2014   Parkinson's disease 01/24/2021   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   Pneumonia    Pre-diabetes    "i was told i was pre-diabetic a while ago"   Tremor 04/13/2014   Past Surgical History:  Procedure Laterality Date   CHOLECYSTECTOMY     COLONOSCOPY W/ POLYPECTOMY     LUMBAR LAMINECTOMY  07/2020   3 level decompression - [place], [place]   LUMBAR LAMINECTOMY/DECOMPRESSION MICRODISCECTOMY N/A 12/19/2017   Procedure: LAMINECTOMY LUMBAR TWO- LUMBAR THREE, LUMBAR THREE- LUMBAR FOUR, LUMBAR FOUR- LUMBAR FIVE ;  Surgeon: Consuella Lose, MD;  Location: [place];  Service: Neurosurgery;  Laterality: N/A;   NASAL SINUS SURGERY     RADIOLOGY WITH ANESTHESIA N/A 05/30/2022   Procedure: MRI WITH ANESTHESIA LUMBAR SPINE W/WO CONSTRAST;  Surgeon: Radiologist, Medication, MD;  Location: [place];  Service: Radiology;  Laterality: N/A;   TEE WITHOUT CARDIOVERSION N/A 06/05/2022   Procedure: TRANSESOPHAGEAL ECHOCARDIOGRAM (TEE);  Surgeon: Pixie Casino, MD;  Location: [facility] ENDOSCOPY;  Service: Cardiovascular;  Laterality: N/A;   TONSILLECTOMY     TOTAL KNEE ARTHROPLASTY Right  06/26/2021   Procedure: RIGHT TOTAL KNEE ARTHROPLASTY;  Surgeon: Leandrew Koyanagi, MD;  Location: [place];  Service: Orthopedics;  Laterality: Right;   WISDOM TOOTH EXTRACTION     Patient Active Problem List   Diagnosis Date Noted   Acute pain of right shoulder 07/02/2022   Lumbar radicular pain    AKI (acute kidney injury) ([place])    Bursitis of right elbow    Bacteremia due to methicillin susceptible Staphylococcus aureus (MSSA) 05/29/2022   SIRS (systemic inflammatory response syndrome) ([place]) 05/28/2022   Bipolar disorder, in partial remission, most recent episode hypomanic ([place]) 05/28/2022   Hyperkalemia 05/28/2022   Dehydration 05/28/2022   Essential hypertension 05/28/2022   Intractable back pain 05/27/2022   Other spondylosis with radiculopathy, lumbar region 10/30/2021   Other secondary scoliosis, lumbar region 10/30/2021   Spondylolisthesis, lumbar region 10/30/2021   Spondylolisthesis of lumbosacral region 10/30/2021   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   THERAPY DIAG:  Acute pain of right shoulder  Stiffness of right knee  Other abnormalities of gait and mobility  Rationale for Evaluation and Treatment: Rehabilitation  ONSET DATE: September 2023  SUBJECTIVE:                                                                                                                                                                                       SUBJECTIVE STATEMENT: 10/17/22: Pt with no new complaints. Legs feel stiff and R shoulder is sore. She did get MRI.   Eval: Pt with main complaint of R shoulder pain. She is R handed. Had sepsis in R elbow in the last 6 months, thinks shoulder pain started around that time. She also notes no pain in elbow, but tingling into fingers 1-3 in R hand more recently. Pain in shoulder is in anterior shoulder, with limited reaching and elevation ability. She also has parkinsons, with significant tremor of hands. She also is having MRI in next couple weeks for mass in R upper arm that was found on recent x-ray.  She states "stiffness" in legs, despite doing several LE exercises daily. She had R knee replacement. R more stiff than L. She also had polio as a child, and has mild LE asymmetry from this.  She states she want to work on her posture She has an aide that helps her get to appts/trasnportation, does laundry, grocery shopping. Pt does own bathing and dressing. Uses 4 WW at all times.    PERTINENT HISTORY: Parkinsons, previous back surgery, previous R TKA .   PAIN:  Are you having pain? Yes: NPRS scale: 5-6/10 Pain location: R shoulder  Pain description: pain Aggravating factors: elevation, increased use  Relieving factors: none stated/ rest   PRECAUTIONS: None  WEIGHT BEARING RESTRICTIONS: No  FALLS:  Has patient fallen in last 6 months? No   PLOF: Independent and Needs assistance with homemaking  PATIENT GOALS: Decreased pain in shoulder, decreased stiffness in Legs, improve posture.    OBJECTIVE:   DIAGNOSTIC FINDINGS:    PATIENT SURVEYS:  FOTO: score: 48 eval  COGNITION: Overall cognitive status: Within functional limits for tasks assessed    POSTURE: sits with feet up off floor, increased tremors in hands and feet.     ROM:   LE: knees; L: flexion: wnl, ext: - 20                    R: flexion: wnl, ext: - 20   Active ROM Right eval Left eval   Shoulder flexion 110 125  Shoulder extension  Shoulder abduction 45   Shoulder adduction    Shoulder internal rotation wfl   Shoulder external rotation wfl   Elbow flexion wfl   Elbow extension wfl   Wrist flexion    Wrist extension    Wrist ulnar deviation    Wrist radial deviation    Wrist pronation    Wrist supination    (Blank rows = not tested)  UPPER EXTREMITY MMT:  LE: hips: 4-/5,  Knees: 4/5   MMT Right eval Left eval  Shoulder flexion 3-   Shoulder extension    Shoulder abduction 3-   Shoulder adduction    Shoulder internal rotation 4-   Shoulder external rotation 4-   Middle trapezius    Lower trapezius    Elbow flexion 4   Elbow extension 4   Wrist flexion    Wrist extension    Wrist ulnar deviation    Wrist radial deviation    Wrist pronation    Wrist supination    Grip strength (lbs)    (Blank rows = not tested)  JOINT MOBILITY TESTING:  Mild hypomobility of R shoulder   PALPATION:  Pain in R bicep/proximal, and bicep groove,  large mass in lateral upper arm non painful.  Hypomobile knee extension, likely from parkinson's and some contracture.    TODAY'S TREATMENT:                                                                                                                                         DATE:   10/17/22: Therapeutic Exercise: Aerobic: Supine:  Pec stretch 30 sec x 3; with wrist ext for nerve glide 2 x 10;  Seated:  LAQ x 10 bil; Review of Ankle pumps x 10 bil (for HEP); Thoracic/trunk rotation x 15;  Standing: Stretches: Seated HSS 30 sec x 4 bil;   LTR x 15;   Neuromuscular Re-education: Manual Therapy: PROM for knee extension bilaterally  Therapeutic Activity: Self Care:    10/04/2022 See below for HEP  PATIENT EDUCATION: Education details: reviewed HEP in detail . Person educated: Patient Education method: Explanation, Demonstration, Tactile cues, Verbal cues, and Handouts Education comprehension: verbalized  understanding, returned demonstration, verbal cues required, tactile cues required, and needs further education Requires max cuing for exercises.   HOME EXERCISE PROGRAM: Access Code: [email or web address]   ASSESSMENT:  CLINICAL IMPRESSION: 10/17/22: Pt with good ability for exercises today, requires max verbal and tactile cuing for correct performance and for education for HEP. Pt with much stiffness in legs/knees likely from parkinsons. She also has stiffness in trunk and hips and will benefit from more education and work on mobility for this. R shoulder painful with most motions, pt has not yet gotten MRI results. Plan to progress as able.    OBJECTIVE IMPAIRMENTS: Abnormal gait, decreased activity tolerance, decreased knowledge of use of DME, decreased mobility, decreased ROM, decreased strength, hypomobility, increased muscle  spasms, impaired flexibility, impaired UE functional use, improper body mechanics, and pain.   ACTIVITY LIMITATIONS: carrying, lifting, stairs, self feeding, reach over head, and locomotion level  PARTICIPATION LIMITATIONS: meal prep, cleaning, shopping, and community activity  PERSONAL FACTORS: Time since onset of injury/illness/exacerbation and 1-2 comorbidities: parkinson's, mass in R arm,   are also affecting patient's functional outcome.   REHAB POTENTIAL: Good  CLINICAL DECISION MAKING: Evolving/moderate complexity  EVALUATION COMPLEXITY: Moderate   GOALS: Goals reviewed with patient? Yes   SHORT TERM GOALS: Target date: 10/24/2022    Pt to be independent with initial HEP for UE and LEs.   Goal status: INITIAL  2.  Pt to be independent with mobility for parkinsons and understand parkinsons implications for mobility.   Goal status: INITIAL     LONG TERM GOALS: Target date: 11/28/2022   Pt to be independent with final HEP for arm, LEs and parkinsons.   Goal status: INITIAL  2.  Pt to demo improved AROM /elevation for R shoulder to be equal to L, to  improve ability for ADLS and IADLS.   Goal status: INITIAL  3.  Pt to demo improved strength of R UE to be at least 4/5, to improve ability for reach, lift, carry and IADLS.   Goal status: INITIAL  4.  Pt to demo improved strength of Bil knees to at least 4+/5 to improve ability for stairs , gait , and IADLs.   Goal status: INITIAL  5.  Pt to demo gait mechanics to be safe and WNL for pt age and diagnosis, to improve ability and safety with community navigation.   Goal status: INITIAL    PLAN:  PT FREQUENCY: 1-2x/week  PT DURATION: 8 weeks  PLANNED INTERVENTIONS: Therapeutic exercises, Therapeutic activity, Neuromuscular re-education, Balance training, Gait training, Patient/Family education, Self Care, Joint mobilization, Joint manipulation, Stair training, DME instructions, Aquatic Therapy, Dry Needling, Electrical stimulation, Spinal manipulation, Spinal mobilization, Cryotherapy, Moist heat, Taping, Vasopneumatic device, Traction, Ultrasound, Ionotophoresis '4mg'$ /ml Dexamethasone, and Manual therapy  PLAN FOR NEXT SESSION:  Scap and ER strength for shoulder; education on parkinson's, LE mobility, stretching,    Lyndee Hensen, PT, DPT 10:10 AM  10/18/22

## 2022-10-18 ENCOUNTER — Encounter: Payer: Self-pay | Admitting: Physical Therapy

## 2022-10-22 ENCOUNTER — Ambulatory Visit: Payer: Medicare Other | Admitting: Psychiatry

## 2022-10-24 ENCOUNTER — Ambulatory Visit: Payer: Medicare Other | Admitting: Physical Therapy

## 2022-10-24 ENCOUNTER — Telehealth: Payer: Self-pay | Admitting: Cardiology

## 2022-10-24 DIAGNOSIS — R2689 Other abnormalities of gait and mobility: Secondary | ICD-10-CM

## 2022-10-24 DIAGNOSIS — M25661 Stiffness of right knee, not elsewhere classified: Secondary | ICD-10-CM

## 2022-10-24 DIAGNOSIS — M25511 Pain in right shoulder: Secondary | ICD-10-CM

## 2022-10-24 NOTE — Therapy (Signed)
OUTPATIENT PHYSICAL THERAPY TREATMENT   Patient Name: Kristin Gilmore MRN: [id number] DOB:12-May-1951, 72 y.o., female Today's Date: 10/24/2022  END OF SESSION:  PT End of Session - 10/27/22 2114     Visit Number 3    Number of Visits 16    Date for PT Re-Evaluation 11/28/22    Authorization Type Medicare    PT Start Time 1430    PT Stop Time 1516    PT Time Calculation (min) 46 min    Activity Tolerance Patient tolerated treatment well    Behavior During Therapy [facility] for tasks assessed/performed              Past Medical History:  Diagnosis Date   Arthritis    Complication of anesthesia    "It didn't knock me out quick enough."   Depression    Dyslipidemia    Hypertension    Migraine    Migraine without aura, without mention of intractable migraine without mention of status migrainosus 04/13/2014   Parkinson's disease 01/24/2021   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   Pneumonia    Pre-diabetes    "i was told i was pre-diabetic a while ago"   Tremor 04/13/2014   Past Surgical History:  Procedure Laterality Date   CHOLECYSTECTOMY     COLONOSCOPY W/ POLYPECTOMY     LUMBAR LAMINECTOMY  07/2020   3 level decompression - [place], [place]   LUMBAR LAMINECTOMY/DECOMPRESSION MICRODISCECTOMY N/A 12/19/2017   Procedure: LAMINECTOMY LUMBAR TWO- LUMBAR THREE, LUMBAR THREE- LUMBAR FOUR, LUMBAR FOUR- LUMBAR FIVE ;  Surgeon: Consuella Lose, MD;  Location: [place];  Service: Neurosurgery;  Laterality: N/A;   NASAL SINUS SURGERY     RADIOLOGY WITH ANESTHESIA N/A 05/30/2022   Procedure: MRI WITH ANESTHESIA LUMBAR SPINE W/WO CONSTRAST;  Surgeon: Radiologist, Medication, MD;  Location: [place];  Service: Radiology;  Laterality: N/A;   TEE WITHOUT CARDIOVERSION N/A 06/05/2022   Procedure: TRANSESOPHAGEAL ECHOCARDIOGRAM (TEE);  Surgeon: Pixie Casino, MD;  Location: [facility] ENDOSCOPY;  Service: Cardiovascular;  Laterality: N/A;   TONSILLECTOMY     TOTAL KNEE ARTHROPLASTY Right  06/26/2021   Procedure: RIGHT TOTAL KNEE ARTHROPLASTY;  Surgeon: Leandrew Koyanagi, MD;  Location: [place];  Service: Orthopedics;  Laterality: Right;   WISDOM TOOTH EXTRACTION     Patient Active Problem List   Diagnosis Date Noted   Acute pain of right shoulder 07/02/2022   Lumbar radicular pain    AKI (acute kidney injury) ([place])    Bursitis of right elbow    Bacteremia due to methicillin susceptible Staphylococcus aureus (MSSA) 05/29/2022   SIRS (systemic inflammatory response syndrome) ([place]) 05/28/2022   Bipolar disorder, in partial remission, most recent episode hypomanic ([place]) 05/28/2022   Hyperkalemia 05/28/2022   Dehydration 05/28/2022   Essential hypertension 05/28/2022   Intractable back pain 05/27/2022   Other spondylosis with radiculopathy, lumbar region 10/30/2021   Other secondary scoliosis, lumbar region 10/30/2021   Spondylolisthesis, lumbar region 10/30/2021   Spondylolisthesis of lumbosacral region 10/30/2021   Status post total right knee replacement 06/26/2021   Primary osteoarthritis of right knee 06/25/2021   Parkinson's disease 01/24/2021   Trochanteric bursitis, left hip 01/18/2020   Lumbar spinal stenosis 12/19/2017   Pain in joint, ankle and foot 05/02/2016   Chronic venous insufficiency 05/02/2016   Peripheral edema 11/01/2015   Intractable chronic migraine without aura 11/02/2014   Tremor 04/13/2014   Migraine without aura 04/13/2014   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   THERAPY DIAG:  Acute pain of right shoulder  Stiffness of right knee  Other abnormalities of gait and mobility  Rationale for Evaluation and Treatment: Rehabilitation  ONSET DATE: September 2023  SUBJECTIVE:                                                                                                                                                                                       SUBJECTIVE STATEMENT: 10/24/22: Pt with no new complaints. Legs feel stiff and R shoulder is sore. Has multiple questions about HEP.   Eval: Pt with main complaint of R shoulder pain. She is R handed. Had sepsis in R elbow in the last 6 months, thinks shoulder pain started around that time. She also notes no pain in elbow, but tingling into fingers 1-3 in R hand more recently. Pain in shoulder is in anterior shoulder, with limited reaching and elevation ability. She also has parkinsons, with significant tremor of hands. She also is having MRI in next couple weeks for mass in R upper arm that was found on recent x-ray.  She states "stiffness" in legs, despite doing several LE exercises daily. She had R knee replacement. R more stiff than L. She also had polio as a child, and has mild LE asymmetry from this.  She states she want to work on her posture She has an aide that helps her get to appts/trasnportation, does laundry, grocery shopping. Pt does own bathing and dressing. Uses 4 WW at all times.    PERTINENT HISTORY: Parkinsons, previous back surgery, previous R TKA .   PAIN:  Are you having pain? Yes: NPRS scale: 5-6/10 Pain location: R shoulder  Pain description: pain Aggravating factors: elevation, increased use  Relieving factors: none stated/ rest   PRECAUTIONS: None  WEIGHT BEARING RESTRICTIONS: No  FALLS:  Has patient fallen in last 6 months? No   PLOF: Independent and Needs assistance with homemaking  PATIENT GOALS: Decreased pain in shoulder, decreased stiffness in Legs, improve posture.    OBJECTIVE:   DIAGNOSTIC FINDINGS:    PATIENT SURVEYS:  FOTO: score: 48 eval  COGNITION: Overall cognitive status: Within functional limits for tasks assessed    POSTURE: sits with feet up off floor, increased tremors in hands and feet.     ROM:   LE: knees; L: flexion: wnl, ext: - 20                    R: flexion: wnl, ext: - 20   Active ROM Right eval  Left eval  Shoulder flexion 110 125  Shoulder  extension    Shoulder abduction 45   Shoulder adduction    Shoulder internal rotation wfl   Shoulder external rotation wfl   Elbow flexion wfl   Elbow extension wfl   Wrist flexion    Wrist extension    Wrist ulnar deviation    Wrist radial deviation    Wrist pronation    Wrist supination    (Blank rows = not tested)  UPPER EXTREMITY MMT:  LE: hips: 4-/5,  Knees: 4/5   MMT Right eval Left eval  Shoulder flexion 3-   Shoulder extension    Shoulder abduction 3-   Shoulder adduction    Shoulder internal rotation 4-   Shoulder external rotation 4-   Middle trapezius    Lower trapezius    Elbow flexion 4   Elbow extension 4   Wrist flexion    Wrist extension    Wrist ulnar deviation    Wrist radial deviation    Wrist pronation    Wrist supination    Grip strength (lbs)    (Blank rows = not tested)  JOINT MOBILITY TESTING:  Mild hypomobility of R shoulder   PALPATION:  Pain in R bicep/proximal, and bicep groove,  large mass in lateral upper arm non painful.  Hypomobile knee extension, likely from parkinson's and some contracture.    TODAY'S TREATMENT:                                                                                                                                         DATE:   10/24/22: Therapeutic Exercise: Aerobic: Supine:  Pec stretch 30 sec x 3; with wrist ext for nerve glide 2 x 10;  Seated:  LAQ x 20 bil 2.5 lb; Thoracic/trunk rotation x 15;  Shoulder ER Ytb 2 x 10;  Standing: Stretches: Seated HSS 30 sec x 4 bil;  LTR x 15;   Neuromuscular Re-education: Manual Therapy: PROM for knee extension bilaterally  Therapeutic Activity: Self Care:    10/17/22: Therapeutic Exercise: Aerobic: Supine:  Pec stretch 30 sec x 3; with wrist ext for nerve glide 2 x 10;  Seated:  LAQ x 10 bil; Review of Ankle pumps x 10 bil (for HEP); Thoracic/trunk rotation x 15;  Standing: Stretches: Seated HSS 30  sec x 4 bil;   LTR x 15;   Neuromuscular Re-education: Manual Therapy: PROM for knee extension bilaterally  Therapeutic Activity: Self Care:    10/04/2022 See below for HEP  PATIENT EDUCATION: Education details: reviewed HEP in detail . Person educated: Patient Education method: Explanation, Demonstration, Tactile cues, Verbal cues, and Handouts Education comprehension: verbalized understanding, returned demonstration, verbal cues required, tactile cues required, and needs further education Requires max cuing for exercises.   HOME EXERCISE PROGRAM: Access Code: [email or web address]   ASSESSMENT:  CLINICAL IMPRESSION: 10/24/22: Pt with good tolerance for exercises today, requires max verbal and tactile cuing for correct  performance and for re-directing to stay on task with one exercise at a time. Ongoing, max education for HEP, due to pt having multiple questions about HEP and prior HEP from previous PT. This has been a barrier for progress during sessions, due to repeating education on exercises that pt has been doing without difficulty for years (she states).  She is able to perform light shoulder strength today without increased pain. Will benefit from ongoing strength for R shoulder to restore elevation, as well as education and mobility for LEs and trunk for parkinsons.    OBJECTIVE IMPAIRMENTS: Abnormal gait, decreased activity tolerance, decreased knowledge of use of DME, decreased mobility, decreased ROM, decreased strength, hypomobility, increased muscle spasms, impaired flexibility, impaired UE functional use, improper body mechanics, and pain.   ACTIVITY LIMITATIONS: carrying, lifting, stairs, self feeding, reach over head, and locomotion level  PARTICIPATION LIMITATIONS: meal prep, cleaning, shopping, and community activity  PERSONAL FACTORS: Time since onset of injury/illness/exacerbation and 1-2 comorbidities: parkinson's, mass in R arm,   are also affecting patient's functional  outcome.   REHAB POTENTIAL: Good  CLINICAL DECISION MAKING: Evolving/moderate complexity  EVALUATION COMPLEXITY: Moderate   GOALS: Goals reviewed with patient? Yes   SHORT TERM GOALS: Target date: 10/24/2022  Pt to be independent with initial HEP for UE and LEs.   Goal status: INITIAL  2.  Pt to be independent with mobility for parkinsons and understand parkinsons implications for mobility.   Goal status: INITIAL   LONG TERM GOALS: Target date: 11/28/2022   Pt to be independent with final HEP for arm, LEs and parkinsons.   Goal status: INITIAL  2.  Pt to demo improved AROM /elevation for R shoulder to be equal to L, to improve ability for ADLS and IADLS.   Goal status: INITIAL  3.  Pt to demo improved strength of R UE to be at least 4/5, to improve ability for reach, lift, carry and IADLS.   Goal status: INITIAL  4.  Pt to demo improved strength of Bil knees to at least 4+/5 to improve ability for stairs , gait , and IADLs.   Goal status: INITIAL  5.  Pt to demo gait mechanics to be safe and WNL for pt age and diagnosis, to improve ability and safety with community navigation.   Goal status: INITIAL    PLAN:  PT FREQUENCY: 1-2x/week  PT DURATION: 8 weeks  PLANNED INTERVENTIONS: Therapeutic exercises, Therapeutic activity, Neuromuscular re-education, Balance training, Gait training, Patient/Family education, Self Care, Joint mobilization, Joint manipulation, Stair training, DME instructions, Aquatic Therapy, Dry Needling, Electrical stimulation, Spinal manipulation, Spinal mobilization, Cryotherapy, Moist heat, Taping, Vasopneumatic device, Traction, Ultrasound, Ionotophoresis '4mg'$ /ml Dexamethasone, and Manual therapy  PLAN FOR NEXT SESSION:  Scap and ER strength for shoulder; education on parkinson's, LE mobility, stretching,    Lyndee Hensen, PT, DPT 9:15 PM  10/27/22

## 2022-10-24 NOTE — Telephone Encounter (Signed)
Patient called stating she just found her ankle weights following her PT visit on 10/24/22. States she isn't aware of what exercises Lyndee Hensen wanted her to do at home. Per Ander Purpura, pt is to do seated knee kick outs, use 2-3 lbs weight.   Per Adonis Brook, I left VM advising patient of what Lauren instructed.

## 2022-10-27 ENCOUNTER — Encounter: Payer: Self-pay | Admitting: Physical Therapy

## 2022-10-30 DIAGNOSIS — G20A1 Parkinson's disease without dyskinesia, without mention of fluctuations: Secondary | ICD-10-CM | POA: Diagnosis not present

## 2022-10-31 ENCOUNTER — Encounter: Payer: Self-pay | Admitting: Physical Therapy

## 2022-10-31 ENCOUNTER — Ambulatory Visit: Payer: Medicare Other | Admitting: Physical Therapy

## 2022-10-31 DIAGNOSIS — R2689 Other abnormalities of gait and mobility: Secondary | ICD-10-CM | POA: Diagnosis not present

## 2022-10-31 DIAGNOSIS — M25661 Stiffness of right knee, not elsewhere classified: Secondary | ICD-10-CM

## 2022-10-31 DIAGNOSIS — M25511 Pain in right shoulder: Secondary | ICD-10-CM | POA: Diagnosis not present

## 2022-10-31 NOTE — Therapy (Signed)
OUTPATIENT PHYSICAL THERAPY TREATMENT   Patient Name: ZOEANN MOL MRN: [id number] DOB:16-Oct-1950, 72 y.o., female Today's Date: 10/31/2022  END OF SESSION:  PT End of Session - 10/31/22 1608     Visit Number 4    Number of Visits 16    Date for PT Re-Evaluation 11/28/22    Authorization Type Medicare    PT Start Time 1431    PT Stop Time 1514    PT Time Calculation (min) 43 min    Activity Tolerance Patient tolerated treatment well    Behavior During Therapy [facility] for tasks assessed/performed               Past Medical History:  Diagnosis Date   Arthritis    Complication of anesthesia    "It didn't knock me out quick enough."   Depression    Dyslipidemia    Hypertension    Migraine    Migraine without aura, without mention of intractable migraine without mention of status migrainosus 04/13/2014   Parkinson's disease 01/24/2021   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   Pneumonia    Pre-diabetes    "i was told i was pre-diabetic a while ago"   Tremor 04/13/2014   Past Surgical History:  Procedure Laterality Date   CHOLECYSTECTOMY     COLONOSCOPY W/ POLYPECTOMY     LUMBAR LAMINECTOMY  07/2020   3 level decompression - [place], [place]   LUMBAR LAMINECTOMY/DECOMPRESSION MICRODISCECTOMY N/A 12/19/2017   Procedure: LAMINECTOMY LUMBAR TWO- LUMBAR THREE, LUMBAR THREE- LUMBAR FOUR, LUMBAR FOUR- LUMBAR FIVE ;  Surgeon: Consuella Lose, MD;  Location: [place];  Service: Neurosurgery;  Laterality: N/A;   NASAL SINUS SURGERY     RADIOLOGY WITH ANESTHESIA N/A 05/30/2022   Procedure: MRI WITH ANESTHESIA LUMBAR SPINE W/WO CONSTRAST;  Surgeon: Radiologist, Medication, MD;  Location: [place];  Service: Radiology;  Laterality: N/A;   TEE WITHOUT CARDIOVERSION N/A 06/05/2022   Procedure: TRANSESOPHAGEAL ECHOCARDIOGRAM (TEE);  Surgeon: Pixie Casino, MD;  Location: [facility] ENDOSCOPY;  Service: Cardiovascular;  Laterality: N/A;   TONSILLECTOMY     TOTAL KNEE ARTHROPLASTY Right  06/26/2021   Procedure: RIGHT TOTAL KNEE ARTHROPLASTY;  Surgeon: Leandrew Koyanagi, MD;  Location: [place];  Service: Orthopedics;  Laterality: Right;   WISDOM TOOTH EXTRACTION     Patient Active Problem List   Diagnosis Date Noted   Acute pain of right shoulder 07/02/2022   Lumbar radicular pain    AKI (acute kidney injury) ([place])    Bursitis of right elbow    Bacteremia due to methicillin susceptible Staphylococcus aureus (MSSA) 05/29/2022   SIRS (systemic inflammatory response syndrome) ([place]) 05/28/2022   Bipolar disorder, in partial remission, most recent episode hypomanic ([place]) 05/28/2022   Hyperkalemia 05/28/2022   Dehydration 05/28/2022   Essential hypertension 05/28/2022   Intractable back pain 05/27/2022   Other spondylosis with radiculopathy, lumbar region 10/30/2021   Other secondary scoliosis, lumbar region 10/30/2021   Spondylolisthesis, lumbar region 10/30/2021   Spondylolisthesis of lumbosacral region 10/30/2021   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   THERAPY DIAG:  Acute pain of right shoulder  Stiffness of right knee  Other abnormalities of gait and mobility  Rationale for Evaluation and Treatment: Rehabilitation  ONSET DATE: September 2023  SUBJECTIVE:                                                                                                                                                                                       SUBJECTIVE STATEMENT: 10/31/2022  R shoulder sore today.   Eval: Pt with main complaint of R shoulder pain. She is R handed. Had sepsis in R elbow in the last 6 months, thinks shoulder pain started around that time. She also notes no pain in elbow, but tingling into fingers 1-3 in R hand more recently. Pain in shoulder is in anterior shoulder, with limited reaching and elevation ability. She also has parkinsons, with significant tremor of hands. She also is having MRI in next couple weeks for mass in R upper arm that was found on recent x-ray.  She states "stiffness" in legs, despite doing several LE exercises daily. She had R knee replacement. R more stiff than L. She also had polio as a child, and has mild LE asymmetry from this.  She states she want to work on her posture She has an aide that helps her get to appts/trasnportation, does laundry, grocery shopping. Pt does own bathing and dressing. Uses 4 WW at all times.    PERTINENT HISTORY: Parkinsons, previous back surgery, previous R TKA .   PAIN:  Are you having pain? Yes: NPRS scale: 5-6/10 Pain location: R shoulder  Pain description: pain Aggravating factors: elevation, increased use  Relieving factors: none stated/ rest   PRECAUTIONS: None  WEIGHT BEARING RESTRICTIONS: No  FALLS:  Has patient fallen in last 6 months? No   PLOF: Independent and Needs assistance with homemaking  PATIENT GOALS: Decreased pain in shoulder, decreased stiffness in Legs, improve posture.    OBJECTIVE:   DIAGNOSTIC FINDINGS:    PATIENT SURVEYS:  FOTO: score: 48 eval  COGNITION: Overall cognitive status: Within functional limits for tasks assessed    POSTURE: sits with feet up off floor, increased tremors in hands and feet.     ROM:   LE: knees; L: flexion: wnl, ext: - 20                    R: flexion: wnl, ext: - 20   Active ROM Right eval Left eval  Shoulder flexion 110 125  Shoulder extension    Shoulder  abduction 45   Shoulder  adduction    Shoulder internal rotation wfl   Shoulder external rotation wfl   Elbow flexion wfl   Elbow extension wfl   Wrist flexion    Wrist extension    Wrist ulnar deviation    Wrist radial deviation    Wrist pronation    Wrist supination    (Blank rows = not tested)  UPPER EXTREMITY MMT:  LE: hips: 4-/5,  Knees: 4/5   MMT Right eval Left eval  Shoulder flexion 3-   Shoulder extension    Shoulder abduction 3-   Shoulder adduction    Shoulder internal rotation 4-   Shoulder external rotation 4-   Middle trapezius    Lower trapezius    Elbow flexion 4   Elbow extension 4   Wrist flexion    Wrist extension    Wrist ulnar deviation    Wrist radial deviation    Wrist pronation    Wrist supination    Grip strength (lbs)    (Blank rows = not tested)  JOINT MOBILITY TESTING:  Mild hypomobility of R shoulder   PALPATION:  Pain in R bicep/proximal, and bicep groove,  large mass in lateral upper arm non painful.  Hypomobile knee extension, likely from parkinson's and some contracture.    TODAY'S TREATMENT:                                                                                                                                         DATE:   10/31/22: Therapeutic Exercise: Aerobic:  recumbent bike L1 x 5 min;  Supine: Seated:  Scap squeeze 2 x 10 ; Education and practice for upright posture and head position x 3 min;  Shoulder ER Ytb 2 x 10; shoulder pulley x 15 flexion Standing:  Scap squeeze at wall x 10 ( increased tremors)  Stretches: Seated HSS 30 sec x 4 bil;   Neuromuscular Re-education: Manual Therapy:  Therapeutic Activity: Self Care:  10/24/22: Therapeutic Exercise: Aerobic: Supine:  Pec stretch 30 sec x 3; with wrist ext for nerve glide 2 x 10;  Seated:  LAQ x 20 bil 2.5 lb; Thoracic/trunk rotation x 15;  Shoulder ER Ytb 2 x 10;  Standing: Stretches: Seated HSS 30 sec x 4 bil;  LTR x 15;   Neuromuscular  Re-education: Manual Therapy: PROM for knee extension bilaterally  Therapeutic Activity: Self Care:    10/17/22: Therapeutic Exercise: Aerobic: Supine:  Pec stretch 30 sec x 3; with wrist ext for nerve glide 2 x 10;  Seated:  LAQ x 10 bil; Review of Ankle pumps x 10 bil (for HEP); Thoracic/trunk rotation x 15;  Standing: Stretches: Seated HSS 30 sec x 4 bil;   LTR x 15;   Neuromuscular Re-education: Manual Therapy: PROM for knee extension bilaterally  Therapeutic Activity: Self Care:    10/04/2022 See below for HEP  PATIENT EDUCATION: Education details: reviewed HEP  in detail . Person educated: Patient Education method: Explanation, Demonstration, Tactile cues, Verbal cues, and Handouts Education comprehension: verbalized understanding, returned demonstration, verbal cues required, tactile cues required, and needs further education Requires max cuing for exercises.   HOME EXERCISE PROGRAM: Access Code: [email or web address]   ASSESSMENT:  CLINICAL IMPRESSION: 10/31/22: Pt with better ability for staying focused on task today. She does have quite a bit of soreness with elevation . Discussed not over doing active elevation if sore. Will continue to focus on scap strength and ER/neutral motions for strengthening. She did well on bike today, discussed use of her recumbent bike at home, for increased leg strength and decreasing stiffness vs her using small peddler.     OBJECTIVE IMPAIRMENTS: Abnormal gait, decreased activity tolerance, decreased knowledge of use of DME, decreased mobility, decreased ROM, decreased strength, hypomobility, increased muscle spasms, impaired flexibility, impaired UE functional use, improper body mechanics, and pain.   ACTIVITY LIMITATIONS: carrying, lifting, stairs, self feeding, reach over head, and locomotion level  PARTICIPATION LIMITATIONS: meal prep, cleaning, shopping, and community activity  PERSONAL FACTORS: Time since onset of injury/illness/exacerbation  and 1-2 comorbidities: parkinson's, mass in R arm,   are also affecting patient's functional outcome.   REHAB POTENTIAL: Good  CLINICAL DECISION MAKING: Evolving/moderate complexity  EVALUATION COMPLEXITY: Moderate   GOALS: Goals reviewed with patient? Yes   SHORT TERM GOALS: Target date: 10/24/2022  Pt to be independent with initial HEP for UE and LEs.   Goal status: INITIAL  2.  Pt to be independent with mobility for parkinsons and understand parkinsons implications for mobility.   Goal status: INITIAL   LONG TERM GOALS: Target date: 11/28/2022   Pt to be independent with final HEP for arm, LEs and parkinsons.   Goal status: INITIAL  2.  Pt to demo improved AROM /elevation for R shoulder to be equal to L, to improve ability for ADLS and IADLS.   Goal status: INITIAL  3.  Pt to demo improved strength of R UE to be at least 4/5, to improve ability for reach, lift, carry and IADLS.   Goal status: INITIAL  4.  Pt to demo improved strength of Bil knees to at least 4+/5 to improve ability for stairs , gait , and IADLs.   Goal status: INITIAL  5.  Pt to demo gait mechanics to be safe and WNL for pt age and diagnosis, to improve ability and safety with community navigation.   Goal status: INITIAL    PLAN:  PT FREQUENCY: 1-2x/week  PT DURATION: 8 weeks  PLANNED INTERVENTIONS: Therapeutic exercises, Therapeutic activity, Neuromuscular re-education, Balance training, Gait training, Patient/Family education, Self Care, Joint mobilization, Joint manipulation, Stair training, DME instructions, Aquatic Therapy, Dry Needling, Electrical stimulation, Spinal manipulation, Spinal mobilization, Cryotherapy, Moist heat, Taping, Vasopneumatic device, Traction, Ultrasound, Ionotophoresis '4mg'$ /ml Dexamethasone, and Manual therapy  PLAN FOR NEXT SESSION:  Scap and ER strength for shoulder; education on parkinson's, LE mobility, stretching,    Lyndee Hensen, PT, DPT 4:09 PM   10/31/22

## 2022-11-07 ENCOUNTER — Ambulatory Visit: Payer: Medicare Other | Admitting: Physical Therapy

## 2022-11-07 ENCOUNTER — Encounter: Payer: Self-pay | Admitting: Physical Therapy

## 2022-11-07 DIAGNOSIS — M25661 Stiffness of right knee, not elsewhere classified: Secondary | ICD-10-CM | POA: Diagnosis not present

## 2022-11-07 DIAGNOSIS — R2689 Other abnormalities of gait and mobility: Secondary | ICD-10-CM

## 2022-11-07 DIAGNOSIS — M25511 Pain in right shoulder: Secondary | ICD-10-CM

## 2022-11-07 NOTE — Therapy (Signed)
OUTPATIENT PHYSICAL THERAPY TREATMENT   Patient Name: VELICIA DEJAGER MRN: [id number] DOB:Feb 01, 1951, 72 y.o., female Today's Date: 11/07/2022  END OF SESSION:  PT End of Session - 11/07/22 1430     Visit Number 5    Number of Visits 16    Date for PT Re-Evaluation 11/28/22    Authorization Type Medicare    PT Start Time 1431    PT Stop Time 1513    PT Time Calculation (min) 42 min    Activity Tolerance Patient tolerated treatment well    Behavior During Therapy WFL for tasks assessed/performed               Past Medical History:  Diagnosis Date   Arthritis    Complication of anesthesia    "It didn't knock me out quick enough."   Depression    Dyslipidemia    Hypertension    Migraine    Migraine without aura, without mention of intractable migraine without mention of status migrainosus 04/13/2014   Parkinson's disease 01/24/2021   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   Pneumonia    Pre-diabetes    "i was told i was pre-diabetic a while ago"   Tremor 04/13/2014   Past Surgical History:  Procedure Laterality Date   CHOLECYSTECTOMY     COLONOSCOPY W/ POLYPECTOMY     LUMBAR LAMINECTOMY  07/2020   3 level decompression - [place], [place]   LUMBAR LAMINECTOMY/DECOMPRESSION MICRODISCECTOMY N/A 12/19/2017   Procedure: LAMINECTOMY LUMBAR TWO- LUMBAR THREE, LUMBAR THREE- LUMBAR FOUR, LUMBAR FOUR- LUMBAR FIVE ;  Surgeon: Consuella Lose, MD;  Location: [place];  Service: Neurosurgery;  Laterality: N/A;   NASAL SINUS SURGERY     RADIOLOGY WITH ANESTHESIA N/A 05/30/2022   Procedure: MRI WITH ANESTHESIA LUMBAR SPINE W/WO CONSTRAST;  Surgeon: Radiologist, Medication, MD;  Location: [place];  Service: Radiology;  Laterality: N/A;   TEE WITHOUT CARDIOVERSION N/A 06/05/2022   Procedure: TRANSESOPHAGEAL ECHOCARDIOGRAM (TEE);  Surgeon: Pixie Casino, MD;  [facility] ENDOSCOPY;  Service: Cardiovascular;  Laterality: N/A;   TONSILLECTOMY     TOTAL KNEE ARTHROPLASTY Right  06/26/2021   Procedure: RIGHT TOTAL KNEE ARTHROPLASTY;  Surgeon: Leandrew Koyanagi, MD;  Location: [place];  Service: Orthopedics;  Laterality: Right;   WISDOM TOOTH EXTRACTION     Patient Active Problem List   Diagnosis Date Noted   Acute pain of right shoulder 07/02/2022   Lumbar radicular pain    AKI (acute kidney injury) ([place])    Bursitis of right elbow    Bacteremia due to methicillin susceptible Staphylococcus aureus (MSSA) 05/29/2022   SIRS (systemic inflammatory response syndrome) ([place]) 05/28/2022   Bipolar disorder, in partial remission, most recent episode hypomanic ([place]) 05/28/2022   Hyperkalemia 05/28/2022   Dehydration 05/28/2022   Essential hypertension 05/28/2022   Intractable back pain 05/27/2022   Other spondylosis with radiculopathy, lumbar region 10/30/2021   Other secondary scoliosis, lumbar region 10/30/2021   Spondylolisthesis, lumbar region 10/30/2021   Spondylolisthesis of lumbosacral region 10/30/2021   Status post total right knee replacement 06/26/2021   Primary osteoarthritis of right knee 06/25/2021   Parkinson's disease 01/24/2021   Trochanteric bursitis, left hip 01/18/2020   Lumbar spinal stenosis 12/19/2017   Pain in joint, ankle and foot 05/02/2016   Chronic venous insufficiency 05/02/2016   Peripheral edema 11/01/2015   Intractable chronic migraine without aura 11/02/2014   Tremor 04/13/2014   Migraine without aura 04/13/2014   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   THERAPY DIAG:  Acute pain of right shoulder  Stiffness of right knee  Other abnormalities of gait and mobility  Rationale for Evaluation and Treatment: Rehabilitation  ONSET DATE: September 2023  SUBJECTIVE:                                                                                                                                                                                       SUBJECTIVE STATEMENT: 11/07/2022  R shoulder still sore. R hip has been very sore. She has an appt with ortho in near future. States most soreness first thing in am, and also with initial standing.   Eval: Pt with main complaint of R shoulder pain. She is R handed. Had sepsis in R elbow in the last 6 months, thinks shoulder pain started around that time. She also notes no pain in elbow, but tingling into fingers 1-3 in R hand more recently. Pain in shoulder is in anterior shoulder, with limited reaching and elevation ability. She also has parkinsons, with significant tremor of hands. She also is having MRI in next couple weeks for mass in R upper arm that was found on recent x-ray.  She states "stiffness" in legs, despite doing several LE exercises daily. She had R knee replacement. R more stiff than L. She also had polio as a child, and has mild LE asymmetry from this.  She states she want to work on her posture She has an aide that helps her get to appts/trasnportation, does laundry, grocery shopping. Pt does own bathing and dressing. Uses 4 WW at all times.    PERTINENT HISTORY: Parkinsons, previous back surgery, previous R TKA .   PAIN:  Are you having pain? Yes: NPRS scale: 5-6/10 Pain location: R shoulder  Pain description: pain Aggravating factors: elevation, increased use  Relieving factors: none stated/ rest   PRECAUTIONS: None  WEIGHT BEARING RESTRICTIONS: No  FALLS:  Has patient fallen in last 6 months? No   PLOF: Independent and Needs assistance with homemaking  PATIENT GOALS: Decreased pain in shoulder, decreased stiffness in Legs, improve posture.    OBJECTIVE:   DIAGNOSTIC FINDINGS:    PATIENT SURVEYS:  FOTO: score: 48 eval  COGNITION: Overall cognitive status: Within functional limits for tasks assessed    POSTURE: sits with feet up off floor, increased tremors in hands and feet.     ROM:   LE: knees; L: flexion: wnl, ext: - 20                     R: flexion: wnl, ext: -  20   Active ROM Right eval Left eval  Shoulder flexion 110 125  Shoulder extension    Shoulder abduction 45   Shoulder adduction    Shoulder internal rotation wfl   Shoulder external rotation wfl   Elbow flexion wfl   Elbow extension wfl   Wrist flexion    Wrist extension    Wrist ulnar deviation    Wrist radial deviation    Wrist pronation    Wrist supination    (Blank rows = not tested)  UPPER EXTREMITY MMT:  LE: hips: 4-/5,  Knees: 4/5   MMT Right eval Left eval  Shoulder flexion 3-   Shoulder extension    Shoulder abduction 3-   Shoulder adduction    Shoulder internal rotation 4-   Shoulder external rotation 4-   Middle trapezius    Lower trapezius    Elbow flexion 4   Elbow extension 4   Wrist flexion    Wrist extension    Wrist ulnar deviation    Wrist radial deviation    Wrist pronation    Wrist supination    Grip strength (lbs)    (Blank rows = not tested)  JOINT MOBILITY TESTING:  Mild hypomobility of R shoulder   PALPATION:  Pain in R bicep/proximal, and bicep groove,  large mass in lateral upper arm non painful.  Hypomobile knee extension, likely from parkinson's and some contracture.    TODAY'S TREATMENT:                                                                                                                                         DATE:   11/07/22: Therapeutic Exercise: Aerobic:  recumbent bike L1 x 8 min;  Supine: S/L:  Hip clams 2 x 10 on R Seated:   Standing:   Stretches: Piriformis 30 sec x 5 on R;  hip ER fallouts x 15, 5 sec;  LTR x 10;  Neuromuscular Re-education: Manual Therapy:  Therapeutic Activity: Self Care: Modalities: ionto 4 hr patch with dexamethasone, to R hip gr troch. Pt states understanding of use and wear time.    10/31/22: Therapeutic Exercise: Aerobic:  recumbent bike L1 x 5 min;  Supine: Seated:  Scap squeeze 2 x 10 ; Education and practice for upright posture and head  position x 3 min;  Shoulder ER Ytb 2 x 10; shoulder pulley x 15 flexion Standing:  Scap squeeze at wall x 10 ( increased tremors)  Stretches: Seated HSS 30 sec x 4 bil;   Neuromuscular Re-education: Manual Therapy:  Therapeutic Activity: Self Care:  10/24/22: Therapeutic Exercise: Aerobic: Supine:  Pec stretch 30 sec x 3; with wrist ext for nerve glide 2 x 10;  Seated:  LAQ x 20 bil 2.5 lb; Thoracic/trunk rotation x 15;  Shoulder ER Ytb 2 x 10;  Standing: Stretches: Seated HSS 30 sec x 4 bil;  LTR x  15;   Neuromuscular Re-education: Manual Therapy: PROM for knee extension bilaterally  Therapeutic Activity: Self Care:    10/17/22: Therapeutic Exercise: Aerobic: Supine:  Pec stretch 30 sec x 3; with wrist ext for nerve glide 2 x 10;  Seated:  LAQ x 10 bil; Review of Ankle pumps x 10 bil (for HEP); Thoracic/trunk rotation x 15;  Standing: Stretches: Seated HSS 30 sec x 4 bil;   LTR x 15;   Neuromuscular Re-education: Manual Therapy: PROM for knee extension bilaterally  Therapeutic Activity: Self Care:  10/04/2022 See below for HEP  PATIENT EDUCATION: Education details: reviewed HEP in detail . Person educated: Patient Education method: Explanation, Demonstration, Tactile cues, Verbal cues, and Handouts Education comprehension: verbalized understanding, returned demonstration, verbal cues required, tactile cues required, and needs further education Requires max cuing for exercises.   HOME EXERCISE PROGRAM: Access Code: [email or web address]   ASSESSMENT:  CLINICAL IMPRESSION: 11/07/22: Pt continues to state knee "stiffness". She is doing several exercises daily, and will also start to use exercise bike at home. Also discussed getting up and walking more frequently. R hip sore with assessment today, likley bursitis. Pain in lateral hip/gr troch, into glute muscle as well as soreness into ITB. Educated on mobility for hip for this today, and ionto patch for pain relief. Pt to benefit from  ongoing mobility for LEs and progressive strength as tolerated.    OBJECTIVE IMPAIRMENTS: Abnormal gait, decreased activity tolerance, decreased knowledge of use of DME, decreased mobility, decreased ROM, decreased strength, hypomobility, increased muscle spasms, impaired flexibility, impaired UE functional use, improper body mechanics, and pain.   ACTIVITY LIMITATIONS: carrying, lifting, stairs, self feeding, reach over head, and locomotion level  PARTICIPATION LIMITATIONS: meal prep, cleaning, shopping, and community activity  PERSONAL FACTORS: Time since onset of injury/illness/exacerbation and 1-2 comorbidities: parkinson's, mass in R arm,   are also affecting patient's functional outcome.   REHAB POTENTIAL: Good  CLINICAL DECISION MAKING: Evolving/moderate complexity  EVALUATION COMPLEXITY: Moderate   GOALS: Goals reviewed with patient? Yes   SHORT TERM GOALS: Target date: 10/24/2022  Pt to be independent with initial HEP for UE and LEs.   Goal status: INITIAL  2.  Pt to be independent with mobility for parkinsons and understand parkinsons implications for mobility.   Goal status: INITIAL   LONG TERM GOALS: Target date: 11/28/2022   Pt to be independent with final HEP for arm, LEs and parkinsons.   Goal status: INITIAL  2.  Pt to demo improved AROM /elevation for R shoulder to be equal to L, to improve ability for ADLS and IADLS.   Goal status: INITIAL  3.  Pt to demo improved strength of R UE to be at least 4/5, to improve ability for reach, lift, carry and IADLS.   Goal status: INITIAL  4.  Pt to demo improved strength of Bil knees to at least 4+/5 to improve ability for stairs , gait , and IADLs.   Goal status: INITIAL  5.  Pt to demo gait mechanics to be safe and WNL for pt age and diagnosis, to improve ability and safety with community navigation.   Goal status: INITIAL    PLAN:  PT FREQUENCY: 1-2x/week  PT DURATION: 8 weeks  PLANNED  INTERVENTIONS: Therapeutic exercises, Therapeutic activity, Neuromuscular re-education, Balance training, Gait training, Patient/Family education, Self Care, Joint mobilization, Joint manipulation, Stair training, DME instructions, Aquatic Therapy, Dry Needling, Electrical stimulation, Spinal manipulation, Spinal mobilization, Cryotherapy, Moist heat, Taping, Vasopneumatic device, Traction, Ultrasound, Ionotophoresis '4mg'$ /ml Dexamethasone,  and Manual therapy  PLAN FOR NEXT SESSION:  Scap and ER strength for shoulder; education on parkinson's, LE mobility, stretching,    Lyndee Hensen, PT, DPT 3:30 PM  11/07/22

## 2022-11-08 ENCOUNTER — Telehealth: Payer: Self-pay | Admitting: Psychiatry

## 2022-11-08 DIAGNOSIS — Z79899 Other long term (current) drug therapy: Secondary | ICD-10-CM | POA: Diagnosis not present

## 2022-11-08 NOTE — Telephone Encounter (Signed)
Patient lvm requesting to speak to the nurse to obtain Xanax until seen by Dr [name]. The message stated she ran out of medication and that Dr [name] prescribed it for her in the past. Contact patient # [phone]

## 2022-11-08 NOTE — Telephone Encounter (Signed)
I will not fill a BZ for her bc I have not seen her in over a year.

## 2022-11-11 ENCOUNTER — Telehealth: Payer: Self-pay | Admitting: Psychiatry

## 2022-11-11 NOTE — Telephone Encounter (Signed)
Pt called at 11:55a.  She would like Dr. [name] to call her back about problems she is having with Xanax.  She said her PCP won't prescribe it because it's controlled substance and is not supposed to be good for pts over 65 and she is 71.  She wonders if there is something else he can prescribe for her.    Next appt 3/14

## 2022-11-12 ENCOUNTER — Ambulatory Visit: Payer: Medicare Other | Admitting: Physical Therapy

## 2022-11-12 ENCOUNTER — Encounter: Payer: Self-pay | Admitting: Physical Therapy

## 2022-11-12 DIAGNOSIS — R2689 Other abnormalities of gait and mobility: Secondary | ICD-10-CM | POA: Diagnosis not present

## 2022-11-12 DIAGNOSIS — M25511 Pain in right shoulder: Secondary | ICD-10-CM

## 2022-11-12 DIAGNOSIS — M25661 Stiffness of right knee, not elsewhere classified: Secondary | ICD-10-CM

## 2022-11-12 NOTE — Therapy (Signed)
OUTPATIENT PHYSICAL THERAPY TREATMENT   Patient Name: IVOREE KRUMWIEDE MRN: [id number] DOB:25-May-1951, 72 y.o., female Today's Date: 11/12/2022  END OF SESSION:  PT End of Session - 11/12/22 1259     Visit Number 6    Number of Visits 16    Date for PT Re-Evaluation 11/28/22    Authorization Type Medicare    PT Start Time 1300    PT Stop Time 1345    PT Time Calculation (min) 45 min    Activity Tolerance Patient tolerated treatment well    Behavior During Therapy [facility] for tasks assessed/performed               Past Medical History:  Diagnosis Date   Arthritis    Complication of anesthesia    "It didn't knock me out quick enough."   Depression    Dyslipidemia    Hypertension    Migraine    Migraine without aura, without mention of intractable migraine without mention of status migrainosus 04/13/2014   Parkinson's disease 01/24/2021   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   Pneumonia    Pre-diabetes    "i was told i was pre-diabetic a while ago"   Tremor 04/13/2014   Past Surgical History:  Procedure Laterality Date   CHOLECYSTECTOMY     COLONOSCOPY W/ POLYPECTOMY     LUMBAR LAMINECTOMY  07/2020   3 level decompression - [place], [place]   LUMBAR LAMINECTOMY/DECOMPRESSION MICRODISCECTOMY N/A 12/19/2017   Procedure: LAMINECTOMY LUMBAR TWO- LUMBAR THREE, LUMBAR THREE- LUMBAR FOUR, LUMBAR FOUR- LUMBAR FIVE ;  Surgeon: Consuella Lose, MD;  Location: [place];  Service: Neurosurgery;  Laterality: N/A;   NASAL SINUS SURGERY     RADIOLOGY WITH ANESTHESIA N/A 05/30/2022   Procedure: MRI WITH ANESTHESIA LUMBAR SPINE W/WO CONSTRAST;  Surgeon: Radiologist, Medication, MD;  Location: [place];  Service: Radiology;  Laterality: N/A;   TEE WITHOUT CARDIOVERSION N/A 06/05/2022   Procedure: TRANSESOPHAGEAL ECHOCARDIOGRAM (TEE);  Surgeon: Pixie Casino, MD;  [facility] ENDOSCOPY;  Service: Cardiovascular;  Laterality: N/A;   TONSILLECTOMY     TOTAL KNEE ARTHROPLASTY Right  06/26/2021   Procedure: RIGHT TOTAL KNEE ARTHROPLASTY;  Surgeon: Leandrew Koyanagi, MD;  Location: [place];  Service: Orthopedics;  Laterality: Right;   WISDOM TOOTH EXTRACTION     Patient Active Problem List   Diagnosis Date Noted   Acute pain of right shoulder 07/02/2022   Lumbar radicular pain    AKI (acute kidney injury) ([place])    Bursitis of right elbow    Bacteremia due to methicillin susceptible Staphylococcus aureus (MSSA) 05/29/2022   SIRS (systemic inflammatory response syndrome) ([place]) 05/28/2022   Bipolar disorder, in partial remission, most recent episode hypomanic ([place]) 05/28/2022   Hyperkalemia 05/28/2022   Dehydration 05/28/2022   Essential hypertension 05/28/2022   Intractable back pain 05/27/2022   Other spondylosis with radiculopathy, lumbar region 10/30/2021   Other secondary scoliosis, lumbar region 10/30/2021   Spondylolisthesis, lumbar region 10/30/2021   Spondylolisthesis of lumbosacral region 10/30/2021   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   THERAPY DIAG:  Acute pain of right shoulder  Stiffness of right knee  Other abnormalities of gait and mobility  Rationale for Evaluation and Treatment: Rehabilitation  ONSET DATE: September 2023  SUBJECTIVE:                                                                                                                                                                                       SUBJECTIVE STATEMENT:  11/12/2022 R shoulder less sore.  R hip has sore in the mornings. Knees feeling very stiff, even though she is doing HEP and now riding bike 2x/day.   Eval: Pt with main complaint of R shoulder pain. She is R handed. Had sepsis in R elbow in the last 6 months, thinks shoulder pain started around that time. She also notes no pain in elbow, but tingling into fingers 1-3 in R hand more recently. Pain in shoulder is in anterior shoulder, with limited reaching and elevation ability. She also has parkinsons, with significant tremor of hands. She also is having MRI in next couple weeks for mass in R upper arm that was found on recent x-ray.  She states "stiffness" in legs, despite doing several LE exercises daily. She had R knee replacement. R more stiff than L. She also had polio as a child, and has mild LE asymmetry from this.  She states she want to work on her posture She has an aide that helps her get to appts/trasnportation, does laundry, grocery shopping. Pt does own bathing and dressing. Uses 4 WW at all times.    PERTINENT HISTORY: Parkinsons, previous back surgery, previous R TKA .   PAIN:  Are you having pain? Yes: NPRS scale: 5-6 /10 Pain location: R shoulder  Pain description: pain Aggravating factors: elevation, increased use  Relieving factors: none stated/ rest   PRECAUTIONS: None  WEIGHT BEARING RESTRICTIONS: No  FALLS:  Has patient fallen in last 6 months? No  PLOF: Independent and Needs assistance with homemaking  PATIENT GOALS: Decreased pain in shoulder, decreased stiffness in Legs, improve posture.    OBJECTIVE:   DIAGNOSTIC FINDINGS:    PATIENT SURVEYS:  FOTO: score: 48 eval  COGNITION: Overall cognitive status: Within functional limits for tasks assessed    POSTURE: sits with feet up off floor, increased tremors in hands and feet.     ROM:   LE: knees; L: flexion: wnl, ext: - 20                    R: flexion: wnl,  ext: - 20  Active ROM Right eval Left eval  Shoulder flexion 110 125  Shoulder extension    Shoulder abduction 45   Shoulder adduction    Shoulder internal rotation wfl   Shoulder external rotation wfl   Elbow flexion wfl   Elbow extension wfl   Wrist flexion    Wrist extension    Wrist ulnar deviation    Wrist radial deviation    Wrist pronation    Wrist supination    (Blank rows = not tested)  UPPER EXTREMITY MMT:  LE: hips: 4-/5,  Knees: 4/5   MMT Right eval Left eval  Shoulder flexion 3-   Shoulder extension    Shoulder abduction 3-   Shoulder adduction    Shoulder internal rotation 4-   Shoulder external rotation 4-   Middle trapezius    Lower trapezius    Elbow flexion 4   Elbow extension 4   Wrist flexion    Wrist extension    Wrist ulnar deviation    Wrist radial deviation    Wrist pronation    Wrist supination    Grip strength (lbs)    (Blank rows = not tested)  JOINT MOBILITY TESTING:  Mild hypomobility of R shoulder   PALPATION:  Pain in R bicep/proximal, and bicep groove,  large mass in lateral upper arm non painful.  Hypomobile knee extension, likely from parkinson's and some contracture.    TODAY'S TREATMENT:                                                                                                                                         DATE:   11/12/22: Therapeutic Exercise: Aerobic:  recumbent bike L1 x 8 min;  Supine:  S/L:  Hip clams 2 x 10 on R , hip abd x 10 on R;  Seated:   Standing:  Ambulation with RW 45 ft x 8, with cuing for more knee flexion with swing, and more upright posture.  Stretches: Seated Piriformis 30 sec x 3 on R;  Hip flexor off side of bed, therapist assisted x 3 min;  Neuromuscular Re-education: Manual Therapy: Manual PROM for knee flex and ext bil; STM/TPR to R glute in s/l.  Self Care: Modalities:   11/07/22: Therapeutic Exercise: Aerobic:  recumbent bike L1 x 8 min;  Supine: S/L:  Hip clams 2 x 10  on R Seated:   Standing:   Stretches: Piriformis 30 sec x 5 on R;  hip ER fallouts x 15, 5 sec;  LTR x 10;  Neuromuscular Re-education: Manual Therapy:  Therapeutic Activity: Self Care: Modalities: ionto 4 hr patch with dexamethasone, to R hip gr troch. Pt states understanding of use and wear time.    10/31/22: Therapeutic Exercise: Aerobic:  recumbent bike L1 x 5 min;  Supine: Seated:  Scap squeeze 2 x 10 ; Education and practice for upright posture and head position x 3  min;  Shoulder ER Ytb 2 x 10; shoulder pulley x 15 flexion Standing:  Scap squeeze at wall x 10 ( increased tremors)  Stretches: Seated HSS 30 sec x 4 bil;   Neuromuscular Re-education: Manual Therapy:  Therapeutic Activity: Self Care:  10/24/22: Therapeutic Exercise: Aerobic: Supine:  Pec stretch 30 sec x 3; with wrist ext for nerve glide 2 x 10;  Seated:  LAQ x 20 bil 2.5 lb; Thoracic/trunk rotation x 15;  Shoulder ER Ytb 2 x 10;  Standing: Stretches: Seated HSS 30 sec x 4 bil;  LTR x 15;   Neuromuscular Re-education: Manual Therapy: PROM for knee extension bilaterally  Therapeutic Activity: Self Care:    10/17/22: Therapeutic Exercise: Aerobic: Supine:  Pec stretch 30 sec x 3; with wrist ext for nerve glide 2 x 10;  Seated:  LAQ x 10 bil; Review of Ankle pumps x 10 bil (for HEP); Thoracic/trunk rotation x 15;  Standing: Stretches: Seated HSS 30 sec x 4 bil;   LTR x 15;   Neuromuscular Re-education: Manual Therapy: PROM for knee extension bilaterally  Therapeutic Activity: Self Care:   PATIENT EDUCATION: Education details: reviewed HEP in detail . Person educated: Patient Education method: Explanation, Demonstration, Tactile cues, Verbal cues, and Handouts Education comprehension: verbalized understanding, returned demonstration, verbal cues required, tactile cues required, and needs further education Requires max cuing for exercises.   HOME EXERCISE PROGRAM: Access Code:  ID:[id number]   ASSESSMENT:  CLINICAL IMPRESSION: 11/12/22: Pt with most soreness in R glute today. Very tender with manual, will benefit from continued release of muscle tension as tolerated. She will wear pants vs dress next visit, to better assess gait mechanics. She has tightness in hip flexors, with limited hip extension, and decreased knee flexion with gait, and increased pain in R glute with walking,mostly 1st few steps. She is doing well with HEP for lumbar and LE mobility and strength.   Pt continues to state knee "stiffness". She is doing several exercises daily, and will also start to use exercise bike at home. Also discussed getting up and walking more frequently. R hip sore with assessment today, likley bursitis. Pain in lateral hip/gr troch, into glute muscle as well as soreness into ITB. Educated on mobility for hip for this today, and ionto patch for pain relief. Pt to benefit from ongoing mobility for LEs and progressive strength as tolerated.    OBJECTIVE IMPAIRMENTS: Abnormal gait, decreased activity tolerance, decreased knowledge of use of DME, decreased mobility, decreased ROM, decreased strength, hypomobility, increased muscle spasms, impaired flexibility, impaired UE functional use, improper body mechanics, and pain.   ACTIVITY LIMITATIONS: carrying, lifting, stairs, self feeding, reach over head, and locomotion level  PARTICIPATION LIMITATIONS: meal prep, cleaning, shopping, and community activity  PERSONAL FACTORS: Time since onset of injury/illness/exacerbation and 1-2 comorbidities: parkinson's, mass in R arm,   are also affecting patient's functional outcome.   REHAB POTENTIAL: Good  CLINICAL DECISION MAKING: Evolving/moderate complexity  EVALUATION COMPLEXITY: Moderate   GOALS: Goals reviewed with patient? Yes   SHORT TERM GOALS: Target date: 10/24/2022  Pt to be independent with initial HEP for UE and LEs.   Goal status: INITIAL  2.  Pt to be independent  with mobility for parkinsons and understand parkinsons implications for mobility.   Goal status: INITIAL   LONG TERM GOALS: Target date: 11/28/2022   Pt to be independent with final HEP for arm, LEs and parkinsons.   Goal status: INITIAL  2.  Pt to demo improved AROM /  elevation for R shoulder to be equal to L, to improve ability for ADLS and IADLS.   Goal status: INITIAL  3.  Pt to demo improved strength of R UE to be at least 4/5, to improve ability for reach, lift, carry and IADLS.   Goal status: INITIAL  4.  Pt to demo improved strength of Bil knees to at least 4+/5 to improve ability for stairs , gait , and IADLs.   Goal status: INITIAL  5.  Pt to demo gait mechanics to be safe and WNL for pt age and diagnosis, to improve ability and safety with community navigation.   Goal status: INITIAL    PLAN:  PT FREQUENCY: 1-2x/week  PT DURATION: 8 weeks  PLANNED INTERVENTIONS: Therapeutic exercises, Therapeutic activity, Neuromuscular re-education, Balance training, Gait training, Patient/Family education, Self Care, Joint mobilization, Joint manipulation, Stair training, DME instructions, Aquatic Therapy, Dry Needling, Electrical stimulation, Spinal manipulation, Spinal mobilization, Cryotherapy, Moist heat, Taping, Vasopneumatic device, Traction, Ultrasound, Ionotophoresis 12m/ml Dexamethasone, and Manual therapy  PLAN FOR NEXT SESSION:  manual for R glute/hip,  gait mechanics, R hip strength, LE mobility  LLyndee Hensen PT, DPT 2:00 PM  11/12/22

## 2022-11-14 NOTE — Telephone Encounter (Signed)
She made an appt for March. She was told that Dr. [name] would not prescribe a bz at this time because it had been over a year since she was seen. She has made several appts, but always cancels them. She said she would try to stretch out what she has left.

## 2022-11-20 ENCOUNTER — Ambulatory Visit: Payer: Medicare Other | Admitting: Orthopedic Surgery

## 2022-11-21 ENCOUNTER — Encounter: Payer: Self-pay | Admitting: Physical Therapy

## 2022-11-21 ENCOUNTER — Ambulatory Visit: Payer: Medicare Other | Admitting: Physical Therapy

## 2022-11-21 DIAGNOSIS — M25511 Pain in right shoulder: Secondary | ICD-10-CM

## 2022-11-21 DIAGNOSIS — M25661 Stiffness of right knee, not elsewhere classified: Secondary | ICD-10-CM

## 2022-11-21 DIAGNOSIS — R2689 Other abnormalities of gait and mobility: Secondary | ICD-10-CM | POA: Diagnosis not present

## 2022-11-21 NOTE — Therapy (Signed)
OUTPATIENT PHYSICAL THERAPY TREATMENT   Patient Name: Kristin Gilmore MRN: [id number] DOB:19-Feb-1951, 72 y.o., female Today's Date: 11/21/2022  END OF SESSION:  PT End of Session - 11/21/22 1649     Visit Number 7    Number of Visits 16    Date for PT Re-Evaluation 11/28/22    Authorization Type Medicare    PT Start Time 1433    PT Stop Time 1515    PT Time Calculation (min) 42 min    Activity Tolerance Patient tolerated treatment well    Behavior During Therapy [facility] for tasks assessed/performed                Past Medical History:  Diagnosis Date   Arthritis    Complication of anesthesia    "It didn't knock me out quick enough."   Depression    Dyslipidemia    Hypertension    Migraine    Migraine without aura, without mention of intractable migraine without mention of status migrainosus 04/13/2014   Parkinson's disease 01/24/2021   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   Pneumonia    Pre-diabetes    "i was told i was pre-diabetic a while ago"   Tremor 04/13/2014   Past Surgical History:  Procedure Laterality Date   CHOLECYSTECTOMY     COLONOSCOPY W/ POLYPECTOMY     LUMBAR LAMINECTOMY  07/2020   3 level decompression - [place], [place]   LUMBAR LAMINECTOMY/DECOMPRESSION MICRODISCECTOMY N/A 12/19/2017   Procedure: LAMINECTOMY LUMBAR TWO- LUMBAR THREE, LUMBAR THREE- LUMBAR FOUR, LUMBAR FOUR- LUMBAR FIVE ;  Surgeon: Consuella Lose, MD;  Location: [place];  Service: Neurosurgery;  Laterality: N/A;   NASAL SINUS SURGERY     RADIOLOGY WITH ANESTHESIA N/A 05/30/2022   Procedure: MRI WITH ANESTHESIA LUMBAR SPINE W/WO CONSTRAST;  Surgeon: Radiologist, Medication, MD;  Location: [place];  Service: Radiology;  Laterality: N/A;   TEE WITHOUT CARDIOVERSION N/A 06/05/2022   Procedure: TRANSESOPHAGEAL ECHOCARDIOGRAM (TEE);  Surgeon: Pixie Casino, MD;  Location: [facility];  Service: Cardiovascular;  Laterality: N/A;   TONSILLECTOMY     TOTAL KNEE ARTHROPLASTY Right  06/26/2021   Procedure: RIGHT TOTAL KNEE ARTHROPLASTY;  Surgeon: Leandrew Koyanagi, MD;  Location: [place];  Service: Orthopedics;  Laterality: Right;   WISDOM TOOTH EXTRACTION     Patient Active Problem List   Diagnosis Date Noted   Acute pain of right shoulder 07/02/2022   Lumbar radicular pain    AKI (acute kidney injury) ([place])    Bursitis of right elbow    Bacteremia due to methicillin susceptible Staphylococcus aureus (MSSA) 05/29/2022   SIRS (systemic inflammatory response syndrome) ([place]) 05/28/2022   Bipolar disorder, in partial remission, most recent episode hypomanic ([place]) 05/28/2022   Hyperkalemia 05/28/2022   Dehydration 05/28/2022   Essential hypertension 05/28/2022   Intractable back pain 05/27/2022   Other spondylosis with radiculopathy, lumbar region 10/30/2021   Other secondary scoliosis, lumbar region 10/30/2021   Spondylolisthesis, lumbar region 10/30/2021   Spondylolisthesis of lumbosacral region 10/30/2021   Status post total right knee replacement 06/26/2021   Primary osteoarthritis of right knee 06/25/2021   Parkinson's disease 01/24/2021   Trochanteric bursitis, left hip 01/18/2020   Lumbar spinal stenosis 12/19/2017   Pain in joint, ankle and foot 05/02/2016   Chronic venous insufficiency 05/02/2016   Peripheral edema 11/01/2015   Intractable chronic migraine without aura 11/02/2014   Tremor 04/13/2014   Migraine without aura 04/13/2014   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   THERAPY DIAG:  Acute pain of right shoulder  Stiffness of right knee  Other abnormalities of gait and mobility  Rationale for Evaluation and Treatment: Rehabilitation  ONSET DATE: September 2023  SUBJECTIVE:                                                                                                                                                                                       SUBJECTIVE STATEMENT:  11/21/2022 R shoulder more painful today. States R hip doing well. She is doing a few stretches in AM and that is helping.   Eval: Pt with main complaint of R shoulder pain. She is R handed. Had sepsis in R elbow in the last 6 months, thinks shoulder pain started around that time. She also notes no pain in elbow, but tingling into fingers 1-3 in R hand more recently. Pain in shoulder is in anterior shoulder, with limited reaching and elevation ability. She also has parkinsons, with significant tremor of hands. She also is having MRI in next couple weeks for mass in R upper arm that was found on recent x-ray.  She states "stiffness" in legs, despite doing several LE exercises daily. She had R knee replacement. R more stiff than L. She also had polio as a child, and has mild LE asymmetry from this.  She states she want to work on her posture She has an aide that helps her get to appts/trasnportation, does laundry, grocery shopping. Pt does own bathing and dressing. Uses 4 WW at all times.    PERTINENT HISTORY: Parkinsons, previous back surgery, previous R TKA .   PAIN:  Are you having pain? Yes: NPRS scale: 5-6 /10 Pain location: R shoulder  Pain description: pain Aggravating factors: elevation, increased use  Relieving factors: none stated/ rest   PRECAUTIONS: None  WEIGHT BEARING RESTRICTIONS: No  FALLS:  Has patient fallen in last 6 months? No  PLOF: Independent and Needs assistance with homemaking  PATIENT GOALS: Decreased pain in shoulder, decreased stiffness in Legs, improve posture.    OBJECTIVE:   DIAGNOSTIC FINDINGS:    PATIENT SURVEYS:  FOTO: score: 48 eval  COGNITION: Overall cognitive status: Within functional limits for tasks assessed    POSTURE: sits with feet up off floor, increased tremors in hands and feet.     ROM:   LE: knees; L: flexion: wnl, ext: - 20                    R: flexion: wnl, ext: - 20   Active ROM  Right eval Left  eval  Shoulder flexion 110 125  Shoulder extension    Shoulder abduction 45   Shoulder adduction    Shoulder internal rotation wfl   Shoulder external rotation wfl   Elbow flexion wfl   Elbow extension wfl   Wrist flexion    Wrist extension    Wrist ulnar deviation    Wrist radial deviation    Wrist pronation    Wrist supination    (Blank rows = not tested)  UPPER EXTREMITY MMT:  LE: hips: 4-/5,  Knees: 4/5   MMT Right eval Left eval  Shoulder flexion 3-   Shoulder extension    Shoulder abduction 3-   Shoulder adduction    Shoulder internal rotation 4-   Shoulder external rotation 4-   Middle trapezius    Lower trapezius    Elbow flexion 4   Elbow extension 4   Wrist flexion    Wrist extension    Wrist ulnar deviation    Wrist radial deviation    Wrist pronation    Wrist supination    Grip strength (lbs)    (Blank rows = not tested)  JOINT MOBILITY TESTING:  Mild hypomobility of R shoulder   PALPATION:  Pain in R bicep/proximal, and bicep groove,  large mass in lateral upper arm non painful.  Hypomobile knee extension, likely from parkinson's and some contracture.    TODAY'S TREATMENT:                                                                                                                                         DATE:   11/21/22: Therapeutic Exercise: Aerobic:  Supine:  S/L:    hip abd x 10 on R;  Seated:   Standing:  Ambulation with RW 45 ft x 8, with cuing for more knee flexion and ext;   Marching 1 UE x 20;  Hip abd 2 x 10 bil;  Stretches: Seated Piriformis 30 sec x 3 on R;  Hip ER fallouts x 10; Seated HSS 30 sec x 3 bil; Neuromuscular Re-education: Manual Therapy:  Self Care: Modalities:     11/12/22: Therapeutic Exercise: Aerobic:  recumbent bike L1 x 8 min;  Supine:  S/L:  Hip clams 2 x 10 on R , hip abd x 10 on R;  Seated:   Standing:  Ambulation with RW 45 ft x 8, with cuing for more knee flexion with swing, and  more upright posture.  Stretches: Seated Piriformis 30 sec x 3 on R;  Hip flexor off side of bed, therapist assisted x 3 min;  Neuromuscular Re-education: Manual Therapy: Manual PROM for knee flex and ext bil; STM/TPR to R glute in s/l.  Self Care: Modalities:     11/07/22: Therapeutic Exercise: Aerobic:  recumbent bike L1 x 8 min;  Supine: S/L:  Hip clams 2 x 10 on R Seated:   Standing:   Stretches:  Piriformis 30 sec x 5 on R;  hip ER fallouts x 15, 5 sec;  LTR x 10;  Neuromuscular Re-education: Manual Therapy:  Therapeutic Activity: Self Care: Modalities: ionto 4 hr patch with dexamethasone, to R hip gr troch. Pt states understanding of use and wear time.    10/31/22: Therapeutic Exercise: Aerobic:  recumbent bike L1 x 5 min;  Supine: Seated:  Scap squeeze 2 x 10 ; Education and practice for upright posture and head position x 3 min;  Shoulder ER Ytb 2 x 10; shoulder pulley x 15 flexion Standing:  Scap squeeze at wall x 10 ( increased tremors)  Stretches: Seated HSS 30 sec x 4 bil;   Neuromuscular Re-education: Manual Therapy:  Therapeutic Activity: Self Care:  10/24/22: Therapeutic Exercise: Aerobic: Supine:  Pec stretch 30 sec x 3; with wrist ext for nerve glide 2 x 10;  Seated:  LAQ x 20 bil 2.5 lb; Thoracic/trunk rotation x 15;  Shoulder ER Ytb 2 x 10;  Standing: Stretches: Seated HSS 30 sec x 4 bil;  LTR x 15;   Neuromuscular Re-education: Manual Therapy: PROM for knee extension bilaterally  Therapeutic Activity: Self Care:    PATIENT EDUCATION: Education details: reviewed HEP in detail . Person educated: Patient Education method: Explanation, Demonstration, Tactile cues, Verbal cues, and Handouts Education comprehension: verbalized understanding, returned demonstration, verbal cues required, tactile cues required, and needs further education Requires max cuing for exercises.   HOME EXERCISE PROGRAM: Access Code: ID:[id number]   ASSESSMENT:  CLINICAL  IMPRESSION: 11/21/22: No tenderness in Glute/hip with palpation today. Reviewed exercises for hip, recommended pt continue to do in AM. Gait assessed further today, pt was wearing pants vs dress. Noted stiffness in knees, and cuing to increase knee flexion and extension with walking. Noted hip extension loss, plan to work on this next visit. Reviewed HEP.   Pt continues to state knee "stiffness". She is doing several exercises daily, and will also start to use exercise bike at home. Also discussed getting up and walking more frequently. R hip sore with assessment today, likley bursitis. Pain in lateral hip/gr troch, into glute muscle as well as soreness into ITB. Educated on mobility for hip for this today, and ionto patch for pain relief. Pt to benefit from ongoing mobility for LEs and progressive strength as tolerated.    OBJECTIVE IMPAIRMENTS: Abnormal gait, decreased activity tolerance, decreased knowledge of use of DME, decreased mobility, decreased ROM, decreased strength, hypomobility, increased muscle spasms, impaired flexibility, impaired UE functional use, improper body mechanics, and pain.   ACTIVITY LIMITATIONS: carrying, lifting, stairs, self feeding, reach over head, and locomotion level  PARTICIPATION LIMITATIONS: meal prep, cleaning, shopping, and community activity  PERSONAL FACTORS: Time since onset of injury/illness/exacerbation and 1-2 comorbidities: parkinson's, mass in R arm,   are also affecting patient's functional outcome.   REHAB POTENTIAL: Good  CLINICAL DECISION MAKING: Evolving/moderate complexity  EVALUATION COMPLEXITY: Moderate   GOALS: Goals reviewed with patient? Yes   SHORT TERM GOALS: Target date: 10/24/2022  Pt to be independent with initial HEP for UE and LEs.   Goal status: INITIAL  2.  Pt to be independent with mobility for parkinsons and understand parkinsons implications for mobility.   Goal status: INITIAL   LONG TERM GOALS: Target date:  11/28/2022   Pt to be independent with final HEP for arm, LEs and parkinsons.   Goal status: INITIAL  2.  Pt to demo improved AROM /elevation for R shoulder to be equal to L, to  improve ability for ADLS and IADLS.   Goal status: INITIAL  3.  Pt to demo improved strength of R UE to be at least 4/5, to improve ability for reach, lift, carry and IADLS.   Goal status: INITIAL  4.  Pt to demo improved strength of Bil knees to at least 4+/5 to improve ability for stairs , gait , and IADLs.   Goal status: INITIAL  5.  Pt to demo gait mechanics to be safe and WNL for pt age and diagnosis, to improve ability and safety with community navigation.   Goal status: INITIAL    PLAN:  PT FREQUENCY: 1-2x/week  PT DURATION: 8 weeks  PLANNED INTERVENTIONS: Therapeutic exercises, Therapeutic activity, Neuromuscular re-education, Balance training, Gait training, Patient/Family education, Self Care, Joint mobilization, Joint manipulation, Stair training, DME instructions, Aquatic Therapy, Dry Needling, Electrical stimulation, Spinal manipulation, Spinal mobilization, Cryotherapy, Moist heat, Taping, Vasopneumatic device, Traction, Ultrasound, Ionotophoresis 70m/ml Dexamethasone, and Manual therapy  PLAN FOR NEXT SESSION:  hip extension.   LLyndee Hensen PT, DPT 4:49 PM  11/21/22

## 2022-11-28 ENCOUNTER — Encounter: Payer: Medicare Other | Admitting: Physical Therapy

## 2022-12-05 ENCOUNTER — Encounter: Payer: Self-pay | Admitting: Physical Therapy

## 2022-12-05 ENCOUNTER — Ambulatory Visit: Payer: Medicare Other | Admitting: Physical Therapy

## 2022-12-05 DIAGNOSIS — R2689 Other abnormalities of gait and mobility: Secondary | ICD-10-CM | POA: Diagnosis not present

## 2022-12-05 DIAGNOSIS — M25661 Stiffness of right knee, not elsewhere classified: Secondary | ICD-10-CM

## 2022-12-05 DIAGNOSIS — M25511 Pain in right shoulder: Secondary | ICD-10-CM

## 2022-12-05 NOTE — Therapy (Signed)
OUTPATIENT PHYSICAL THERAPY TREATMENT/Re-Cert    Patient Name: Kristin Gilmore MRN: [id number] DOB:10-07-1950, 72 y.o., female Today's Date: 12/05/2022  END OF SESSION:  PT End of Session - 12/05/22 1459     Visit Number 8    Number of Visits 16    Date for PT Re-Evaluation 01/16/23    Authorization Type Medicare re-cert done at visit 8    PT Start Time 1429    PT Stop Time 1512    PT Time Calculation (min) 43 min    Equipment Utilized During Treatment Gait belt    Activity Tolerance Patient tolerated treatment well    Behavior During Therapy WFL for tasks assessed/performed                Past Medical History:  Diagnosis Date   Arthritis    Complication of anesthesia    "It didn't knock me out quick enough."   Depression    Dyslipidemia    Hypertension    Migraine    Migraine without aura, without mention of intractable migraine without mention of status migrainosus 04/13/2014   Parkinson's disease 01/24/2021   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   Pneumonia    Pre-diabetes    "i was told i was pre-diabetic a while ago"   Tremor 04/13/2014   Past Surgical History:  Procedure Laterality Date   CHOLECYSTECTOMY     COLONOSCOPY W/ POLYPECTOMY     LUMBAR LAMINECTOMY  07/2020   3 level decompression - [place], [place]   LUMBAR LAMINECTOMY/DECOMPRESSION MICRODISCECTOMY N/A 12/19/2017   Procedure: LAMINECTOMY LUMBAR TWO- LUMBAR THREE, LUMBAR THREE- LUMBAR FOUR, LUMBAR FOUR- LUMBAR FIVE ;  Surgeon: Consuella Lose, MD;  Location: [facility];  Service: Neurosurgery;  Laterality: N/A;   NASAL SINUS SURGERY     RADIOLOGY WITH ANESTHESIA N/A 05/30/2022   Procedure: MRI WITH ANESTHESIA LUMBAR SPINE W/WO CONSTRAST;  Surgeon: Radiologist, Medication, MD;  Location: [facility];  Service: Radiology;  Laterality: N/A;   TEE WITHOUT CARDIOVERSION N/A 06/05/2022   Procedure: TRANSESOPHAGEAL ECHOCARDIOGRAM (TEE);  Surgeon: Pixie Casino, MD;  Location[facility] ENDOSCOPY;  Service:  Cardiovascular;  Laterality: N/A;   TONSILLECTOMY     TOTAL KNEE ARTHROPLASTY Right 06/26/2021   Procedure: RIGHT TOTAL KNEE ARTHROPLASTY;  Surgeon: Leandrew Koyanagi, MD;  Location: [facility];  Service: Orthopedics;  Laterality: Right;   WISDOM TOOTH EXTRACTION     Patient Active Problem List   Diagnosis Date Noted   Acute pain of right shoulder 07/02/2022   Lumbar radicular pain    AKI (acute kidney injury) ([place])    Bursitis of right elbow    Bacteremia due to methicillin susceptible Staphylococcus aureus (MSSA) 05/29/2022   SIRS (systemic inflammatory response syndrome) ([place]) 05/28/2022   Bipolar disorder, in partial remission, most recent episode hypomanic ([place]) 05/28/2022   Hyperkalemia 05/28/2022   Dehydration 05/28/2022   Essential hypertension 05/28/2022   Intractable back pain 05/27/2022   Other spondylosis with radiculopathy, lumbar region 10/30/2021   Other secondary scoliosis, lumbar region 10/30/2021   Spondylolisthesis, lumbar region 10/30/2021   Spondylolisthesis of lumbosacral region 10/30/2021   Status post total right knee replacement 06/26/2021   Primary osteoarthritis of right knee 06/25/2021   Parkinson's disease 01/24/2021   Trochanteric bursitis, left hip 01/18/2020   Lumbar spinal stenosis 12/19/2017   Pain in joint, ankle and foot 05/02/2016   Chronic venous insufficiency 05/02/2016   Peripheral edema 11/01/2015   Intractable chronic migraine without aura 11/02/2014   Tremor 04/13/2014   Migraine  without aura 04/13/2014   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   THERAPY DIAG:  Acute pain of right shoulder  Stiffness of right knee  Other abnormalities of gait and mobility  Rationale for Evaluation and Treatment: Rehabilitation  ONSET DATE: September 2023  SUBJECTIVE:                                                                                                                                                                                       SUBJECTIVE STATEMENT:  12/05/2022 R shoulder has been a bit better. R hip bothering her. Has been doing stretches in AM when its stiff. Also has been doing bicycle, up to 30-40 min/day , thinks it is helping with some leg stiffness.   Eval: Pt with main complaint of R shoulder pain. She is R handed. Had sepsis in R elbow in the last 6 months, thinks shoulder pain started around that time. She also notes no pain in elbow, but tingling into fingers 1-3 in R hand more recently. Pain in shoulder is in anterior shoulder, with limited reaching and elevation ability. She also has parkinsons, with significant tremor of hands. She also is having MRI in next couple weeks for mass in R upper arm that was found on recent x-ray.  She states "stiffness" in legs, despite doing several LE exercises daily. She had R knee replacement. R more stiff than L. She also had polio as a child, and has mild LE asymmetry from this.  She states she want to work on her posture She has an aide that helps her get to appts/trasnportation, does laundry, grocery shopping. Pt does own bathing and dressing. Uses 4 WW at all times.    PERTINENT HISTORY: Parkinsons, previous back surgery, previous R TKA .   PAIN:  Are you having pain? Yes: NPRS scale: 5-6 /10 Pain location: R shoulder  Pain description: pain Aggravating factors: elevation, increased use  Relieving factors: none stated/ rest   PRECAUTIONS: None  WEIGHT BEARING RESTRICTIONS: No  FALLS:  Has patient fallen in last 6 months? No  PLOF: Independent and Needs assistance with homemaking  PATIENT GOALS: Decreased pain in shoulder, decreased stiffness in Legs, improve posture.    OBJECTIVE: updated 12/05/22  DIAGNOSTIC FINDINGS:    PATIENT SURVEYS:  FOTO: score: 48 eval  COGNITION: Overall cognitive status: Within functional limits for tasks  assessed    POSTURE: sits with feet up off floor, increased tremors in hands and feet.     ROM:   LE: knees; L: flexion: wnl, ext: -  15                   R: flexion: wnl, ext: - 15  Active ROM Right eval Left eval  Shoulder flexion 115 125  Shoulder extension    Shoulder abduction 85   Shoulder adduction    Shoulder internal rotation wfl   Shoulder external rotation wfl   Elbow flexion wfl   Elbow extension wfl   Wrist flexion    Wrist extension    Wrist ulnar deviation    Wrist radial deviation    Wrist pronation    Wrist supination    (Blank rows = not tested)  UPPER EXTREMITY MMT:  LE: hips: 4-/5,  Knees: 4+/5   MMT Right eval Left eval  Shoulder flexion 3-   Shoulder extension    Shoulder abduction 3-   Shoulder adduction    Shoulder internal rotation 4-   Shoulder external rotation 4-   Middle trapezius    Lower trapezius    Elbow flexion 4   Elbow extension 4   Wrist flexion    Wrist extension    Wrist ulnar deviation    Wrist radial deviation    Wrist pronation    Wrist supination    Grip strength (lbs)    (Blank rows = not tested)  JOINT MOBILITY TESTING:  Mild hypomobility of R shoulder   PALPATION:  Pain in R bicep/proximal, and bicep groove,  large mass in lateral upper arm non painful.  Hypomobile knee extension, likely from parkinson's and some contracture.    TODAY'S TREATMENT:                                                                                                                                         DATE:   12/05/22: Therapeutic Exercise: Aerobic:  Supine: Clams GTB x 20;  S/L:     Seated:   Standing:  Ambulation with RW 45 ft x 8, with cuing for longer strike length;     Stretches: Supine hip flexor stretch, trial for leg off end of table, and off side of table, better tolerated off side of table; x 3 bil each position;  Neuromuscular Re-education: Manual Therapy: Manual hip flexor and quad stretch on R, in s/l.  Self  Care: Modalities:   11/21/22: Therapeutic Exercise: Aerobic:  Supine:  S/L:    hip abd x 10 on R;  Seated:   Standing:  Ambulation with RW 45 ft x 8, with cuing for more knee flexion and ext;   Marching 1 UE x 20;  Hip abd 2 x 10 bil;  Stretches: Seated Piriformis 30 sec x 3 on R;  Hip ER fallouts x 10; Seated HSS 30 sec x 3 bil; Neuromuscular Re-education: Manual Therapy:  Self Care: Modalities:    11/12/22: Therapeutic Exercise: Aerobic:  recumbent bike L1 x 8 min;  Supine:  S/L:  Hip clams 2 x 10 on R , hip abd x 10 on R;  Seated:   Standing:  Ambulation with RW 45 ft x 8, with cuing for more knee flexion with swing, and more upright posture.  Stretches: Seated Piriformis 30 sec x 3 on R;  Hip flexor off side of bed, therapist assisted x 3 min;  Neuromuscular Re-education: Manual Therapy: Manual PROM for knee flex and ext bil; STM/TPR to R glute in s/l.  Self Care: Modalities:   PATIENT EDUCATION: Education details: reviewed HEP in detail . Person educated: Patient Education method: Explanation, Demonstration, Tactile cues, Verbal cues, and Handouts Education comprehension: verbalized understanding, returned demonstration, verbal cues required, tactile cues required, and needs further education Requires max cuing for exercises.   HOME EXERCISE PROGRAM: Access Code: [email or web address]   ASSESSMENT:  CLINICAL IMPRESSION: [id number] Re-Cert.  Pt has been seen for 8 visits, she is able to come 1x/wk due to transportation. She is doing very well with compliance with HEP. She has continued stiffness in LEs, due to parkinsons, but with improving management of this. She has variable pain in R shoulder, with likely tear of RTC, also doing better with management of this. She has pain in R hip, that is limiting for mobility and standing/walking. Much stiffness in hip flexors and quads, will benefit from continued work on this, as well as gait mechanics and endurance. Pt tires easily with  standing/walking with RW. Pt to benefit from continued care for education on management of symptoms and parkinsons, as well as improving functional activity and pain.     OBJECTIVE IMPAIRMENTS: Abnormal gait, decreased activity tolerance, decreased knowledge of use of DME, decreased mobility, decreased ROM, decreased strength, hypomobility, increased muscle spasms, impaired flexibility, impaired UE functional use, improper body mechanics, and pain.   ACTIVITY LIMITATIONS: carrying, lifting, stairs, self feeding, reach over head, and locomotion level  PARTICIPATION LIMITATIONS: meal prep, cleaning, shopping, and community activity  PERSONAL FACTORS: Time since onset of injury/illness/exacerbation and 1-2 comorbidities: parkinson's, mass in R arm,   are also affecting patient's functional outcome.   REHAB POTENTIAL: Good  CLINICAL DECISION MAKING: Evolving/moderate complexity  EVALUATION COMPLEXITY: Moderate   GOALS: Goals reviewed with patient? Yes   SHORT TERM GOALS: Target date: 10/24/2022  Pt to be independent with initial HEP for UE and LEs.   Goal status: MET  2.  Pt to be independent with mobility for parkinsons and understand parkinsons implications for mobility.   Goal status: MET   LONG TERM GOALS: Target date: 01/16/23   Pt to be independent with final HEP for arm, LEs and parkinsons.   Goal status: IN PROGRESS  2.  Pt to demo improved AROM /elevation for R shoulder to be equal to L, to improve ability for ADLS and IADLS.   Goal status: IN PROGRESS  3.  Pt to demo improved strength of R UE to be at least 4/5, to improve ability for reach, lift, carry and IADLS.   Goal status: IN PROGRESS  4.  Pt to demo improved strength of Bil knees to at least 4+/5 to improve ability for stairs , gait , and IADLs.   Goal status: IN PROGRESS  5.  Pt to demo gait mechanics to be safe and WNL for pt age and diagnosis, to improve ability and safety with community navigation.    Goal status: IN PROGRESS    PLAN:  PT FREQUENCY: 1-2x/week  PT DURATION: 6 weeks  PLANNED INTERVENTIONS: Therapeutic exercises, Therapeutic activity, Neuromuscular  re-education, Balance training, Gait training, Patient/Family education, Self Care, Joint mobilization, Joint manipulation, Stair training, DME instructions, Aquatic Therapy, Dry Needling, Electrical stimulation, Spinal manipulation, Spinal mobilization, Cryotherapy, Moist heat, Taping, Vasopneumatic device, Traction, Ultrasound, Ionotophoresis '4mg'$ /ml Dexamethasone, and Manual therapy  PLAN FOR NEXT SESSION:  hip extension, LE strength, walking.   Lyndee Hensen, PT, DPT 3:14 PM  12/05/22

## 2022-12-10 ENCOUNTER — Telehealth: Payer: Self-pay | Admitting: Orthopedic Surgery

## 2022-12-10 ENCOUNTER — Encounter: Payer: Medicare Other | Admitting: Physical Therapy

## 2022-12-10 ENCOUNTER — Telehealth: Payer: Self-pay | Admitting: *Deleted

## 2022-12-10 ENCOUNTER — Telehealth: Payer: Self-pay | Admitting: Physical Therapy

## 2022-12-10 NOTE — Telephone Encounter (Signed)
I called and she states that she is having sciatica pain. I advised that she can take up to Ibuprofen '800mg'$  and Tylenol '1000mg'$ , alternating them every 4 hrs, Advised not to exceed '2400mg'$  of Ibuprofen or '3000mg'$  of Tylenol

## 2022-12-10 NOTE — Telephone Encounter (Signed)
I called and advised that when she tired to call triage and lmom that Gabriel Cirri tried to call her back and had to lmom for her, I advised that when she calls [place] to please make sure that she is able to answer the phone so we are not playing phone tag all day.  I advised her to calls [place] back.

## 2022-12-10 NOTE — Telephone Encounter (Signed)
Patient called clinic with complaints of sciatica and wanting to know which exercises from her HEP are OK to do. Reviewed entire HEP with patient and answered all questions. Discussed performing supine and seated exercises but placing standing exercises on hold. Instructed patient on stretches that may be helpful with current symptoms. Patient inquired about medication to take and advised patient to f/u with MD in regards to medication.   3:27 PM, 12/10/22 Jerene Pitch, DPT Physical Therapy with [facility]

## 2022-12-10 NOTE — Telephone Encounter (Signed)
Patient called. She would like someone to call her back about her pain. [phone]

## 2022-12-10 NOTE — Telephone Encounter (Signed)
Tried caling pt back, no answer.

## 2022-12-10 NOTE — Telephone Encounter (Signed)
Patient states she is in severe pain. Transferring call to Triage.

## 2022-12-12 ENCOUNTER — Ambulatory Visit: Payer: Medicare Other | Admitting: Psychiatry

## 2022-12-16 ENCOUNTER — Telehealth: Payer: Self-pay | Admitting: Orthopedic Surgery

## 2022-12-16 ENCOUNTER — Telehealth: Payer: Self-pay

## 2022-12-16 NOTE — Telephone Encounter (Signed)
I have called her multiple times and it keeps going to her VM--Patient needs to come in on Wednesday @ 9 to be evaluated Per Dr. Laurance Flatten.

## 2022-12-16 NOTE — Telephone Encounter (Signed)
Physical Therapist already spoke to pt.   Patient Name: Kristin Gilmore Gender: Female DOB: Feb 10, 1951 Age: 72 Y 77 M 8 D Return Phone Number: [phone] (Primary) Address: City/ State/ Zip: [place]   [address] Client [place] [facility] [place] Client Site [place] [facility] [employer] Type Call Who Is Calling Patient / Member / Family / Caregiver Call Type Triage / Clinical Relationship To Patient Self Return Phone Number [phone] (Primary) Chief Complaint Leg Pain Reason for Call Symptomatic / Request for [place] says that she has really bad sciatica, and she can hardly walk. She has terrible pain from the outside of her leg down to her foot; the knee hurts a lot. Should she force herself to do her exercises? Sees Dr. Edwina Barth (not listed) Translation No Nurse Assessment Nurse: Eugenio Hoes, RN, Jenny Reichmann Date/Time Eilene Ghazi Time): 12/14/2022 9:26:30 AM Confirm and document reason for call. If symptomatic, describe symptoms. ---Caller states that she is having sciatica pain. Caller states that symptoms have been present for several weeks. Caller states that she took both Tylenol and ibuprofen but is still having pain. Caller states that pain is 10/10. Does the patient have any new or worsening symptoms? ---Yes Will a triage be completed? ---Yes Related visit to physician within the last 2 weeks? ---No Does the PT have any chronic conditions? (i.e. diabetes, asthma, this includes High risk factors for pregnancy, etc.) ---Yes List chronic conditions. ---back surgery, TKR Is this a behavioral health or substance abuse call? ---No Guidelines Guideline Title Affirmed Question Affirmed Notes Nurse Date/Time (Eastern Time) Back Pain [1] SEVERE back pain (e.g., excruciating, unable to do any normal activities) AND [2] Lynett Fish 12/14/2022 9:28:32 AM Guidelines Guideline Title  Affirmed Question Affirmed Notes Nurse Date/Time Eilene Ghazi Time) not improved 2 hours after pain medicine Disp. Time Eilene Ghazi Time) Disposition Final User 12/14/2022 9:33:42 AM See HCP within 4 Hours (or PCP triage) Yes Eugenio Hoes, RN, Jenny Reichmann Final Disposition 12/14/2022 9:33:42 AM See HCP within 4 Hours (or PCP triage) Yes Eugenio Hoes, RN, [place] Disagree/Comply Disagree Caller Understands Yes PreDisposition Did not know what to do Care Advice Given Per Guideline SEE HCP (OR PCP TRIAGE) WITHIN 4 HOURS: * IF OFFICE WILL BE OPEN: You need to be seen within the next 3 or 4 hours. Call your doctor (or NP/PA) now or as soon as the office opens. CALL BACK IF: * You become worse CARE ADVICE given per Back Pain (Adult) guideline. Referrals GO TO FACILITY REFUSED

## 2022-12-16 NOTE — Telephone Encounter (Signed)
I spoke with patient she states that she can't come in on 12/18/22 so I could get her in on 01/02/23 @ 145. She states that she is in a lot of pain in the mornings till she gets up and starts moving. I advised she needs to take her OTC meds for the pain.

## 2022-12-16 NOTE — Telephone Encounter (Signed)
Patient would like a call back.  Stated that she is in a lot of pain with her back that is radiating down to her hip and down her right leg.  CB# [phone].  Please advise.

## 2022-12-16 NOTE — Telephone Encounter (Signed)
I called and lmom for pt to call me back 

## 2022-12-16 NOTE — Telephone Encounter (Signed)
Pt called asking for a urgent call back from [place]. Pt states she need medical advise and really need a call right away. Please call pt at [phone].

## 2022-12-18 ENCOUNTER — Ambulatory Visit: Payer: Medicare Other | Admitting: Orthopedic Surgery

## 2022-12-19 ENCOUNTER — Encounter: Payer: Medicare Other | Admitting: Physical Therapy

## 2022-12-20 ENCOUNTER — Telehealth: Payer: Self-pay | Admitting: Cardiology

## 2022-12-20 NOTE — Telephone Encounter (Signed)
Pt would like a call back. She has some questions.

## 2022-12-25 DIAGNOSIS — F4381 Prolonged grief disorder: Secondary | ICD-10-CM | POA: Diagnosis not present

## 2022-12-25 DIAGNOSIS — M461 Sacroiliitis, not elsewhere classified: Secondary | ICD-10-CM | POA: Diagnosis not present

## 2022-12-25 DIAGNOSIS — G44221 Chronic tension-type headache, intractable: Secondary | ICD-10-CM | POA: Diagnosis not present

## 2022-12-25 DIAGNOSIS — G20A1 Parkinson's disease without dyskinesia, without mention of fluctuations: Secondary | ICD-10-CM | POA: Diagnosis not present

## 2022-12-26 ENCOUNTER — Telehealth: Payer: Self-pay | Admitting: Physical Therapy

## 2022-12-26 ENCOUNTER — Encounter: Payer: Medicare Other | Admitting: Physical Therapy

## 2022-12-26 NOTE — Telephone Encounter (Signed)
Pt states she would like a call back.

## 2022-12-27 ENCOUNTER — Other Ambulatory Visit: Payer: Self-pay | Admitting: Surgical

## 2022-12-27 MED ORDER — GABAPENTIN 100 MG PO CAPS: 100.0000 mg | ORAL_CAPSULE | Freq: Three times a day (TID) | ORAL | 0 refills | Status: DC

## 2022-12-30 ENCOUNTER — Telehealth: Payer: Self-pay

## 2022-12-30 ENCOUNTER — Telehealth: Payer: Self-pay | Admitting: Radiology

## 2022-12-30 DIAGNOSIS — M5416 Radiculopathy, lumbar region: Secondary | ICD-10-CM

## 2022-12-30 NOTE — Telephone Encounter (Signed)
I called and lmom advising her of Dr. Tawanna Sat options.  She needs to advise [place] which options she would like to try.

## 2022-12-30 NOTE — Telephone Encounter (Signed)
Patient called triage line. She is having right sided sciatica and headaches. Her neurologist told her to contact Dr.Moore. She does not feel like she can wait until her next appointment. She wants [name] to call her back. [phone]

## 2022-12-30 NOTE — Telephone Encounter (Signed)
She would like to get an injection with Dr. Ernestina Patches. --- She saw her Neurologist on Wednesday 12/25/22, he wanted her to stop with the tylenol and Advil which is causing her headaches, he gave her Gabapentin and amitripline. I advised that she needs to take those as he has directed and that she has to give it time to help. She asked what she could due for her headache, I advised that she needs to let the tylenol/advil out of her system and it may improve and it may take a few days.

## 2022-12-31 ENCOUNTER — Encounter: Payer: Self-pay | Admitting: Psychiatry

## 2022-12-31 ENCOUNTER — Ambulatory Visit (INDEPENDENT_AMBULATORY_CARE_PROVIDER_SITE_OTHER): Payer: Medicare Other | Admitting: Psychiatry

## 2022-12-31 DIAGNOSIS — F5105 Insomnia due to other mental disorder: Secondary | ICD-10-CM

## 2022-12-31 DIAGNOSIS — F3110 Bipolar disorder, current episode manic without psychotic features, unspecified: Secondary | ICD-10-CM

## 2022-12-31 DIAGNOSIS — F4321 Adjustment disorder with depressed mood: Secondary | ICD-10-CM | POA: Diagnosis not present
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
## 2023-01-01 ENCOUNTER — Other Ambulatory Visit: Payer: Self-pay

## 2023-01-01 ENCOUNTER — Telehealth: Payer: Self-pay | Admitting: Psychiatry

## 2023-01-01 MED ORDER — DIVALPROEX SODIUM ER 250 MG PO TB24: ORAL_TABLET | ORAL | 1 refills | Status: DC

## 2023-01-01 NOTE — Telephone Encounter (Signed)
Pended.

## 2023-01-01 NOTE — Telephone Encounter (Signed)
Refill request for Depakote received from [facility]. Pt had visit yesterday and looks like he is restarting Depakote. Can we send in an RX for her to [facility]  at [place], [place]?

## 2023-01-02 ENCOUNTER — Other Ambulatory Visit (INDEPENDENT_AMBULATORY_CARE_PROVIDER_SITE_OTHER): Payer: Medicare Other

## 2023-01-02 ENCOUNTER — Encounter: Payer: Medicare Other | Admitting: Physical Therapy

## 2023-01-02 ENCOUNTER — Telehealth: Payer: Self-pay

## 2023-01-02 ENCOUNTER — Ambulatory Visit (INDEPENDENT_AMBULATORY_CARE_PROVIDER_SITE_OTHER): Payer: Medicare Other | Admitting: Orthopedic Surgery

## 2023-01-02 ENCOUNTER — Other Ambulatory Visit: Payer: Self-pay | Admitting: Physical Medicine and Rehabilitation

## 2023-01-02 DIAGNOSIS — M75111 Incomplete rotator cuff tear or rupture of right shoulder, not specified as traumatic: Secondary | ICD-10-CM | POA: Diagnosis not present

## 2023-01-02 DIAGNOSIS — M5416 Radiculopathy, lumbar region: Secondary | ICD-10-CM

## 2023-01-02 DIAGNOSIS — M542 Cervicalgia: Secondary | ICD-10-CM

## 2023-01-02 MED ORDER — DIAZEPAM 5 MG PO TABS: ORAL_TABLET | ORAL | 0 refills | Status: DC

## 2023-01-02 NOTE — Telephone Encounter (Signed)
Patient is scheduled for injection on 01/13/23. Patient needs pre-procedure Valium sent to [place]

## 2023-01-02 NOTE — Progress Notes (Signed)
Orthopedic Spine Surgery Office Note  Assessment: Patient is a 72 y.o. female with right shoulder pain possible rotator cuff tear.  Also seen for low back pain radiating into the right lateral thigh and anterior lateral leg, possible radiculopathy.   Plan: -Explained that initially conservative treatment is tried as a significant number of patients may experience relief with these treatment modalities. Discussed that the conservative treatments include:  -activity modification  -physical therapy  -over the counter pain medications  -medrol dosepak  -cervical steroid injections -Patient has tried Tylenol, ibuprofen, activity modification, steroid injections -Discussed diagnostic and therapeutic injection of the right shoulder.  Patient wanted proceed with that today -If she is not any better at her next visit, will order MRI of the right shoulder to evaluate for rotator cuff tear -She wanted to try another injection to the L5-S1 to see if she can get some relief on that right side, referral provided to Dr. Ernestina Patches -Referred to pain management for her chronic pain -She is not interested in any further spine surgical intervention at this time -Patient should return to office on an as needed basis   Patient expressed understanding of the plan and all questions were answered to the patient's satisfaction.   Right shoulder injection note: After discussing the risk, benefits, alternatives of right shoulder intra-articular injection, patient elected to proceed.  The soft spot inferior to the posterior acromion was prepped with alcohol based prep.  A 22-gauge needle was used to inject 1 cc of Depo-Medrol, 1 cc of bupivacaine, 1 cc of lidocaine into the shoulder joint via the posterior approach under standard sterile technique.  The needle was withdrawn and then a Band-Aid was applied.  Patient tolerated the procedure well.  Checked on patient a couple minutes after the injection and she stated her  shoulder pain was better.  __________________________________________________________________________  History: Patient is a 72 y.o. female who has been previously seen in the office for symptoms consistent with lumbar radiculopathy.  After her last visit, a injection was recommended at L5/S1 for diagnostic and therapeutic purposes.  Patient cannot remember exactly how much relief she got with that injection.  She comes in today with pain in her low back radiating into the lateral aspect of her right thigh and anterior lateral leg.  It does not radiate past the ankle.  She has no similar symptoms on the left side.  Pain is felt on a daily basis and has been getting worse in the last month or so.  Denies numbness and paresthesias in the lower extremities  She also wanted talk about her right shoulder today.  She has noticed for the last 2 to 3 months pain in the right shoulder.  There is no trauma or injury that brought on the pain.  Pain is felt over the lateral aspect of the shoulder.  It is worse with any activity in the shoulder.  It does improve with rest.  She initially was getting some relief but not complete relief with Tylenol and ibuprofen alternating.  She was told by her neurologist to stop those medications as it may be causing her headaches.  She has no pain radiating past the shoulder.  No left shoulder pain.  Has a history of carpal tunnel syndrome and gets numbness and paresthesias in her right hand at night.  No recent changes.  No other numbness or paresthesias.  Previous treatments: Tylenol, ibuprofen, activity modification, steroid injections  Physical Exam:  General: no acute distress, appears stated age Neurologic:  alert, answering questions appropriately, following commands Respiratory: unlabored breathing on room air, symmetric chest rise Psychiatric: appropriate affect, normal cadence to speech   MSK (spine):  -Strength exam      Left  Right  EHL    5/5  5/5 TA     5/5  5/5 GSC    5/5  5/5 Knee extension  5/5  5/5 Hip flexion   5/5  5/5  -Sensory exam    Sensation intact to light touch in L3-S1 nerve distributions of bilateral lower extremities  -Spurling: Negative bilaterally -Hoffman sign: Negative bilaterally -Interosseous wasting: None seen  Left shoulder exam: No pain to range of motion Right shoulder exam: Pain with external rotation past 90 degrees, positive Hawkins, pain with Jobe and slight weakness when compared to contralateral side, no weakness with external rotation with arm at side, negative belly press  Tinel's at wrist: Negative bilaterally Phalen's at wrist: Negative bilaterally Durkan's: Negative bilaterally  Tinel's at elbow: Negative bilaterally  Imaging: XR of the cervical spine from 01/02/2023 was independently reviewed and interpreted, showing disc height loss at C5/6 and C6/7.  Positive cervical sagittal balance with loss of lordosis at the lower cervical levels.  XR of the cervical spine from 01/02/2023 was independently reviewed and interpreted, showing instrumentation from L3-L5 with interbody devices at L3/4 and L4/5.  There is a lumbar scoliosis with apex at L2/3.  No lucencies around the instrumentation, screws are not backed out.  Disc height loss at L2/3 and L5/S1.  No fracture or dislocation seen.    Patient name: Kristin Gilmore Patient MRN: [id number] Date of visit: 01/02/23

## 2023-01-04 ENCOUNTER — Other Ambulatory Visit: Payer: Self-pay | Admitting: Surgical

## 2023-01-09 ENCOUNTER — Encounter: Payer: Medicare Other | Admitting: Physical Therapy

## 2023-01-13 ENCOUNTER — Other Ambulatory Visit: Payer: Self-pay

## 2023-01-13 ENCOUNTER — Ambulatory Visit (INDEPENDENT_AMBULATORY_CARE_PROVIDER_SITE_OTHER): Payer: Medicare Other | Admitting: Physical Medicine and Rehabilitation

## 2023-01-13 VITALS — BP 158/73 | HR 74

## 2023-01-13 DIAGNOSIS — M5416 Radiculopathy, lumbar region: Secondary | ICD-10-CM

## 2023-01-13 MED ORDER — METHYLPREDNISOLONE ACETATE 80 MG/ML IJ SUSP: 80.0000 mg | Freq: Once | INTRAMUSCULAR | Status: DC

## 2023-01-13 NOTE — Patient Instructions (Signed)

## 2023-01-13 NOTE — Progress Notes (Signed)
Functional Pain Scale - descriptive words and definitions  Intense (8)    Cannot complete any ADLs without much assistance/cannot concentrate/conversation is difficult/unable to sleep and unable to use distraction. Severe range order  Average Pain 8-9   +Driver, -BT, -Dye Allergies.

## 2023-01-15 ENCOUNTER — Other Ambulatory Visit: Payer: Self-pay | Admitting: Surgical

## 2023-01-16 ENCOUNTER — Encounter: Payer: Medicare Other | Admitting: Physical Therapy

## 2023-01-21 DIAGNOSIS — G894 Chronic pain syndrome: Secondary | ICD-10-CM | POA: Diagnosis not present

## 2023-01-21 DIAGNOSIS — M545 Low back pain, unspecified: Secondary | ICD-10-CM | POA: Diagnosis not present

## 2023-01-21 DIAGNOSIS — M129 Arthropathy, unspecified: Secondary | ICD-10-CM | POA: Diagnosis not present

## 2023-01-21 DIAGNOSIS — E559 Vitamin D deficiency, unspecified: Secondary | ICD-10-CM | POA: Diagnosis not present

## 2023-01-21 DIAGNOSIS — Z79899 Other long term (current) drug therapy: Secondary | ICD-10-CM | POA: Diagnosis not present

## 2023-01-23 ENCOUNTER — Encounter: Payer: Medicare Other | Admitting: Physical Therapy

## 2023-01-24 NOTE — Progress Notes (Signed)
Kristin Gilmore - 72 y.o. female MRN [id number]  Date of birth: 16-Sep-1951  Office Visit Note: Visit Date: 01/13/2023 PCP: Pcp, No Referred by: London Sheer, MD  Subjective: Chief Complaint  Patient presents with   Lower Back - Pain   HPI:  Kristin Gilmore is a 72 y.o. female who comes in today at the request of Dr. Willia Craze for planned Bilateral L5-S1 Lumbar Transforaminal epidural steroid injection with fluoroscopic guidance.  The patient has failed conservative care including home exercise, medications, time and activity modification.  This injection will be diagnostic and hopefully therapeutic.  Please see requesting physician notes for further details and justification.   ROS Otherwise per HPI.  Assessment & Plan: Visit Diagnoses:    ICD-10-CM   1. Radiculopathy, lumbar region  M54.16 XR C-ARM NO REPORT    Epidural Steroid injection    methylPREDNISolone acetate (DEPO-MEDROL) injection 80 mg      Plan: No additional findings.   Meds & Orders:  Meds ordered this encounter  Medications   methylPREDNISolone acetate (DEPO-MEDROL) injection 80 mg    Orders Placed This Encounter  Procedures   XR C-ARM NO REPORT   Epidural Steroid injection    Follow-up: Return for visit to requesting provider as needed.   Procedures: No procedures performed  Lumbosacral Transforaminal Epidural Steroid Injection - Sub-Pedicular Approach with Fluoroscopic Guidance  Patient: Kristin Gilmore      Date of Birth: 1951-09-11 MRN: [id number] PCP: Pcp, No      Visit Date: 01/13/2023   Universal Protocol:    Date/Time: 01/13/2023  Consent Given By: the patient  Position: PRONE  Additional Comments: Vital signs were monitored before and after the procedure. Patient was prepped and draped in the usual sterile fashion. The correct patient, procedure, and site was verified.   Injection Procedure Details:   Procedure diagnoses: Radiculopathy, lumbar region  [M54.16]    Meds Administered:  Meds ordered this encounter  Medications   methylPREDNISolone acetate (DEPO-MEDROL) injection 80 mg    Laterality: Bilateral  Location/Site: L5  Needle:5.0 in., 22 ga.  Short bevel or Quincke spinal needle  Needle Placement: Transforaminal  Findings:    -Comments: Excellent flow of contrast along the nerve, nerve root and into the epidural space.  Procedure Details: After squaring off the end-plates to get a true AP view, the C-arm was positioned so that an oblique view of the foramen as noted above was visualized. The target area is just inferior to the "nose of the scotty dog" or sub pedicular. The soft tissues overlying this structure were infiltrated with 2-3 ml. of 1% Lidocaine without Epinephrine.  The spinal needle was inserted toward the target using a "trajectory" view along the fluoroscope beam.  Under AP and lateral visualization, the needle was advanced so it did not puncture dura and was located close the 6 O'Clock position of the pedical in AP tracterory. Biplanar projections were used to confirm position. Aspiration was confirmed to be negative for CSF and/or blood. A 1-2 ml. volume of Isovue-250 was injected and flow of contrast was noted at each level. Radiographs were obtained for documentation purposes.   After attaining the desired flow of contrast documented above, a 0.5 to 1.0 ml test dose of 0.25% Marcaine was injected into each respective transforaminal space.  The patient was observed for 90 seconds post injection.  After no sensory deficits were reported, and normal lower extremity motor function was noted,   the above injectate was administered  so that equal amounts of the injectate were placed at each foramen (level) into the transforaminal epidural space.   Additional Comments:  No complications occurred Dressing: 2 x 2 sterile gauze and Band-Aid    Post-procedure details: Patient was observed during the  procedure. Post-procedure instructions were reviewed.  Patient left the clinic in stable condition.    Clinical History: PER Dr. Karleen Dolphin patient needs to be seen for her RIGHT Knee- she should be following up with Dr. Roda Shutters   MRI LUMBAR SPINE WITHOUT AND WITH CONTRAST    CONTRAST:  10mL GADAVIST GADOBUTROL 1 MMOL/ML IV SOLN   COMPARISON:  CT lumbar spine 04/22/2022, lumbar spine MRI 07/18/2021   FINDINGS: Segmentation: Standard; the lowest formed disc space is designated L5-S1.   Alignment: Levocurvature centered at L2 is unchanged. Trace anterolisthesis of L3 on L4 and 5 mm anterolisthesis of L4 on L5 is unchanged.   Vertebrae: Postsurgical changes reflecting posterior instrumented fusion at L3 through L5 with interbody spacers are again seen. Lucency around the L5 screws seen on the prior CT is not appreciated by MRI.   Background marrow signal is within normal limits. There is degenerative endplate marrow signal abnormality with edema but no enhancement at L2-L3 which is increased since the preoperative study from 2022. There is no other marrow edema. There is no abnormal marrow enhancement.   Conus medullaris and cauda equina: Conus extends to the L1 level. Conus and cauda equina appear normal. There is no abnormal enhancement of the conus or cauda equina nerve roots.   Paraspinal and other soft tissues: There are postsurgical changes in the soft tissues posterior to the surgical levels. There is no suspicious fluid collection. The endometrium is distended measuring up to 8 mm in thickness.   Disc levels:   T12-L1: No significant spinal canal or neural foraminal stenosis   L1-L2: No significant spinal canal or neural foraminal stenosis.   L2-L3: There is disc desiccation and narrowing with a diffuse disc bulge and small superiorly migrated extrusion, and moderate bilateral facet arthropathy resulting in moderate spinal canal stenosis with subarticular zone  narrowing and possible irritation of either traversing L3 nerve root, and no significant neural foraminal stenosis. Compared to the preoperative study from 2022, the spinal canal stenosis has worsened.   L3-L4: Status post posterior instrumented fusion and decompression. There is no residual spinal canal stenosis. There is mild residual right worse than left neural foraminal stenosis without convincing evidence of nerve root compression.   L4-L5: Status post posterior instrumented fusion and decompression. There is grade 1 anterolisthesis with uncovering of the disc posteriorly and bilateral facet arthropathy. There is no residual spinal canal stenosis; however, there is severe left and mild-to-moderate right neural foraminal stenosis with compression of the exiting left L4 nerve root (5-11).   L5-S1: There is advanced disc desiccation and narrowing with a disc bulge and central protrusion and annular fissure, moderate bilateral facet arthropathy resulting in severe left worse than right neural foraminal stenosis without significant spinal canal stenosis. There is no evidence of compression of the traversing S1 nerve roots as was questioned on prior CT.   IMPRESSION: 1. Status post posterior instrumented fusion and decompression at L3 through L5 without residual spinal canal stenosis at the surgical levels; however, there is severe left and mild-to-moderate right neural foraminal stenosis at L4-L5 with compression of the exiting left L4 nerve root. No other convincing nerve root compression at the surgical levels. 2. 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 3. Unchanged advanced disc degeneration and moderate facet arthropathy at L5-S1 with severe left worse than right neural foraminal  stenosis. No evidence of compression of the traversing S1 nerve roots as was questioned on prior CT. 4. Distended endometrium measuring up to 8 mm. Recommend correlation with pelvic ultrasound given patient age.     Electronically Signed   By: Lesia Hausen M.D.   On: 05/30/2022 11:16     Objective:  VS:  HT:    WT:   BMI:     BP:(!) 158/73  HR:74bpm  TEMP: ( )  RESP:  Physical Exam Vitals and nursing note reviewed.  Constitutional:      General: She is not in acute distress.    Appearance: Normal appearance. She is not ill-appearing.  HENT:     Head: Normocephalic and atraumatic.     Right Ear: External ear normal.     Left Ear: External ear normal.  Eyes:     Extraocular Movements: Extraocular movements intact.  Cardiovascular:     Rate and Rhythm: Normal rate.     Pulses: Normal pulses.  Pulmonary:     Effort: Pulmonary effort is normal. No respiratory distress.  Abdominal:     General: There is no distension.     Palpations: Abdomen is soft.  Musculoskeletal:        General: Tenderness present.     Cervical back: Neck supple.     Right lower leg: No edema.     Left lower leg: No edema.     Comments: Patient has good distal strength with no pain over the greater trochanters.  No clonus or focal weakness.  Skin:    Findings: No erythema, lesion or rash.  Neurological:     General: No focal deficit present.     Mental Status: She is alert and oriented to person, place, and time.     Sensory: No sensory deficit.     Motor: No weakness or abnormal muscle tone.     Coordination: Coordination normal.  Psychiatric:        Mood and Affect: Mood normal.        Behavior: Behavior normal.      Imaging: No results found.

## 2023-01-24 NOTE — Procedures (Signed)
Lumbosacral Transforaminal Epidural Steroid Injection - Sub-Pedicular Approach with Fluoroscopic Guidance  Patient: Kristin Gilmore      Date of Birth: 1951/07/23 MRN: [id number] PCP: Pcp, No      Visit Date: 01/13/2023   Universal Protocol:    Date/Time: 01/13/2023  Consent Given By: the patient  Position: PRONE  Additional Comments: Vital signs were monitored before and after the procedure. Patient was prepped and draped in the usual sterile fashion. The correct patient, procedure, and site was verified.   Injection Procedure Details:   Procedure diagnoses: Radiculopathy, lumbar region [M54.16]    Meds Administered:  Meds ordered this encounter  Medications   methylPREDNISolone acetate (DEPO-MEDROL) injection 80 mg    Laterality: Bilateral  Location/Site: L5  Needle:5.0 in., 22 ga.  Short bevel or Quincke spinal needle  Needle Placement: Transforaminal  Findings:    -Comments: Excellent flow of contrast along the nerve, nerve root and into the epidural space.  Procedure Details: After squaring off the end-plates to get a true AP view, the C-arm was positioned so that an oblique view of the foramen as noted above was visualized. The target area is just inferior to the "nose of the scotty dog" or sub pedicular. The soft tissues overlying this structure were infiltrated with 2-3 ml. of 1% Lidocaine without Epinephrine.  The spinal needle was inserted toward the target using a "trajectory" view along the fluoroscope beam.  Under AP and lateral visualization, the needle was advanced so it did not puncture dura and was located close the 6 O'Clock position of the pedical in AP tracterory. Biplanar projections were used to confirm position. Aspiration was confirmed to be negative for CSF and/or blood. A 1-2 ml. volume of Isovue-250 was injected and flow of contrast was noted at each level. Radiographs were obtained for documentation purposes.   After attaining the desired  flow of contrast documented above, a 0.5 to 1.0 ml test dose of 0.25% Marcaine was injected into each respective transforaminal space.  The patient was observed for 90 seconds post injection.  After no sensory deficits were reported, and normal lower extremity motor function was noted,   the above injectate was administered so that equal amounts of the injectate were placed at each foramen (level) into the transforaminal epidural space.   Additional Comments:  No complications occurred Dressing: 2 x 2 sterile gauze and Band-Aid    Post-procedure details: Patient was observed during the procedure. Post-procedure instructions were reviewed.  Patient left the clinic in stable condition.

## 2023-01-26 ENCOUNTER — Other Ambulatory Visit: Payer: Self-pay | Admitting: Surgical

## 2023-01-27 NOTE — Telephone Encounter (Signed)
[place] patient, I think I just prescribed this on practice call one day.

## 2023-02-01 ENCOUNTER — Other Ambulatory Visit: Payer: Self-pay | Admitting: Psychiatry

## 2023-02-01 DIAGNOSIS — F3171 Bipolar disorder, in partial remission, most recent episode hypomanic: Secondary | ICD-10-CM

## 2023-02-04 DIAGNOSIS — R030 Elevated blood-pressure reading, without diagnosis of hypertension: Secondary | ICD-10-CM | POA: Diagnosis not present

## 2023-02-04 DIAGNOSIS — E559 Vitamin D deficiency, unspecified: Secondary | ICD-10-CM | POA: Diagnosis not present

## 2023-02-04 DIAGNOSIS — G894 Chronic pain syndrome: Secondary | ICD-10-CM | POA: Diagnosis not present

## 2023-02-04 DIAGNOSIS — Z79899 Other long term (current) drug therapy: Secondary | ICD-10-CM | POA: Diagnosis not present

## 2023-02-04 DIAGNOSIS — M545 Low back pain, unspecified: Secondary | ICD-10-CM | POA: Diagnosis not present

## 2023-02-04 DIAGNOSIS — Z6827 Body mass index (BMI) 27.0-27.9, adult: Secondary | ICD-10-CM | POA: Diagnosis not present

## 2023-02-05 ENCOUNTER — Telehealth: Payer: Self-pay | Admitting: Psychiatry

## 2023-02-05 ENCOUNTER — Other Ambulatory Visit: Payer: Self-pay | Admitting: Psychiatry

## 2023-02-05 DIAGNOSIS — F3110 Bipolar disorder, current episode manic without psychotic features, unspecified: Secondary | ICD-10-CM

## 2023-02-05 MED ORDER — DIVALPROEX SODIUM ER 250 MG PO TB24: 750.0000 mg | ORAL_TABLET | Freq: Every day | ORAL | 1 refills | Status: DC

## 2023-02-05 NOTE — Telephone Encounter (Signed)
Pt LVM @ 11

## 2023-02-05 NOTE — Telephone Encounter (Signed)
Sent to [facility] ER 250 mg tablets, 3 at night.  To [facility].

## 2023-02-05 NOTE — Telephone Encounter (Signed)
Patient reported [facility] will not fill her [facility]. When I called the pharmacy the pharmacist said the Rx had been deactivated, but can't/won't provide any more information that that. She said a new Rx will need to be sent. I can do that, but wanted to check with you first to make sure it should have been stopped. I don't see any documentation in Epic of [place] or another provider stopping it.

## 2023-02-05 NOTE — Telephone Encounter (Signed)
She stated she has 2 refills of Depakote, but [facility] won't ref[facility].  They told her that Dr Jennelle Human doesn't want her taking it any more.  She wants a call back to find out why.  Next appt 5/29

## 2023-02-06 ENCOUNTER — Telehealth: Payer: Self-pay | Admitting: Psychiatry

## 2023-02-06 NOTE — Telephone Encounter (Signed)
Patient notified of Rx sent by Dr. Jennelle Human.

## 2023-02-06 NOTE — Telephone Encounter (Signed)
Kristin Gilmore called at 9:58 to check on prescription for her Depakote.  As clarification from message yesterday she reports that she is taking 2 every night and that it is working perfectly.  She does not need 3 per night.  But the pharmacy will not get her a refill saying the medication has been discontinued.  Please call the pharmacy for approval to give her refills; however, she does only need 2 per night.  [facility] [address]  Please call [place] to clarify with her.  Appt 5/29

## 2023-02-13 ENCOUNTER — Ambulatory Visit (INDEPENDENT_AMBULATORY_CARE_PROVIDER_SITE_OTHER): Payer: Medicare Other | Admitting: Physical Therapy

## 2023-02-13 ENCOUNTER — Encounter: Payer: Self-pay | Admitting: Physical Therapy

## 2023-02-13 DIAGNOSIS — M25661 Stiffness of right knee, not elsewhere classified: Secondary | ICD-10-CM | POA: Diagnosis not present

## 2023-02-13 DIAGNOSIS — R2689 Other abnormalities of gait and mobility: Secondary | ICD-10-CM | POA: Diagnosis not present

## 2023-02-13 DIAGNOSIS — M25511 Pain in right shoulder: Secondary | ICD-10-CM | POA: Diagnosis not present

## 2023-02-13 NOTE — Therapy (Addendum)
OUTPATIENT PHYSICAL THERAPY TREATMENT/Re-Cert    Patient Name: Kristin Gilmore MRN: [id number] DOB:[birth year], 72 y.o., female Today's Date: 02/13/2023  END OF SESSION:  PT End of Session - 02/13/23 1337     Visit Number 9    Number of Visits 20    Date for PT Re-Evaluation 04/10/23    Authorization Type Medicare re-cert done at visit 8, and visit 9.    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   Pneumonia    Pre-diabetes    "i was told i was pre-diabetic a while ago"   Tremor 04/13/2014   Past Surgical History:  Procedure Laterality Date   CHOLECYSTECTOMY     COLONOSCOPY W/ POLYPECTOMY     LUMBAR LAMINECTOMY  07/2020   3 level decompression - [place], [place]   LUMBAR LAMINECTOMY/DECOMPRESSION MICRODISCECTOMY N/A 12/19/2017   Procedure: LAMINECTOMY LUMBAR TWO- LUMBAR THREE, LUMBAR THREE- LUMBAR FOUR, LUMBAR FOUR- LUMBAR FIVE ;  Surgeon: Lisbeth Renshaw, MD;  Location: [facility] OR;  Service: Neurosurgery;  Laterality: N/A;   NASAL SINUS SURGERY     RADIOLOGY WITH ANESTHESIA N/A 05/30/2022   Procedure: MRI WITH ANESTHESIA LUMBAR SPINE W/WO CONSTRAST;  Surgeon: Radiologist, Medication, MD;  Location: [facility] OR;  Service: Radiology;  Laterality: N/A;   TEE WITHOUT CARDIOVERSION N/A 06/05/2022   Procedure: TRANSESOPHAGEAL ECHOCARDIOGRAM (TEE);  Surgeon: Chrystie Nose, MD;  Location: [facility] ENDOSCOPY;   Service: Cardiovascular;  Laterality: N/A;   TONSILLECTOMY     TOTAL KNEE ARTHROPLASTY Right 06/26/2021   Procedure: RIGHT TOTAL KNEE ARTHROPLASTY;  Surgeon: Tarry Kos, MD;  Location: [facility] OR;  Service: Orthopedics;  Laterality: Right;   WISDOM TOOTH EXTRACTION     Patient Active Problem List   Diagnosis Date Noted   Acute pain of right shoulder 07/02/2022   Lumbar radicular pain    AKI (acute kidney injury) (HCC)    Bursitis of right elbow    Bacteremia due to methicillin susceptible Staphylococcus aureus (MSSA) 05/29/2022   SIRS (systemic inflammatory response syndrome) (HCC) 05/28/2022   Bipolar disorder, in partial remission, most recent episode hypomanic (HCC) 05/28/2022   Hyperkalemia 05/28/2022   Dehydration 05/28/2022   Essential hypertension 05/28/2022   Intractable back pain 05/27/2022   Other spondylosis with radiculopathy, lumbar region 10/30/2021   Other secondary scoliosis, lumbar region 10/30/2021   Spondylolisthesis, lumbar region 10/30/2021   Spondylolisthesis of lumbosacral region 10/30/2021   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   THERAPY DIAG:  Acute pain of right shoulder  Stiffness of right knee  Other abnormalities of gait and mobility  Rationale for Evaluation and Treatment: Rehabilitation  ONSET DATE: September 2023  SUBJECTIVE:                                                                                                                                                                                       SUBJECTIVE STATEMENT:  02/13/2023 Pt last seen 12/05/22. She has not been feeling well, has had other appts (neurology) and has had trouble with transportation. She still has an aide coming 2x/wk . She has had increasing back and radicular pain since she was last seen. She is seeing pain management and has started Hydrocodone which she feels it is helping some. She also uses ice and has been doing some stretching.  Has been able to continue to do bike.  Hurst most with standing and walking.  R shoulder:  doing better. Meds may be helping this too.  R knee: still feeling very stiff.  Pt request to focus more on standing, walking and endurance. She would like to be able to walk to the pool so she can exercise in the water.    Eval: Pt with main complaint of R shoulder pain. She is R handed. Had sepsis in R elbow in the last 6 months, thinks shoulder pain started around that time. She also notes no pain in elbow, but tingling into fingers 1-3 in R hand more recently. Pain in shoulder is in anterior shoulder, with limited reaching and elevation ability. She also has parkinsons, with significant tremor of hands. She also is having MRI in next couple weeks for mass in R upper arm that was found on recent x-ray.  She states "stiffness" in legs, despite doing several LE exercises daily. She had R knee replacement. R more stiff than L. She also had polio as a child, and has mild LE asymmetry from this.  She states she want to work on her posture She has an aide that helps her get to appts/trasnportation, does laundry, grocery shopping. Pt does own bathing and dressing. Uses 4 WW at all times.    PERTINENT HISTORY: Parkinsons, previous back surgery, previous R TKA .   PAIN:  Are you having pain? Yes: NPRS scale: 5-6 /10 Pain location: R shoulder  Pain description: pain Aggravating factors:  elevation, increased use  Relieving factors: none stated/ rest  PRECAUTIONS: None  WEIGHT BEARING RESTRICTIONS: No  FALLS:  Has patient fallen in last 6 months? No  PLOF: Independent and Needs assistance with homemaking  PATIENT GOALS: Decreased pain in shoulder, decreased stiffness in Legs, improve posture.    OBJECTIVE: updated 02/13/23  DIAGNOSTIC FINDINGS:    PATIENT SURVEYS:  FOTO: score: 48 eval  COGNITION: Overall cognitive status: Within functional limits for tasks assessed    POSTURE:  tremors in hands and feet.     ROM:   LE: knees; L: flexion: wnl, ext: - 15        02/13/23: -10 to 120                    R: flexion: wnl, ext: - 15       02/13/23:   -10 to 110  Back: mod/significant limitation for flex, ext and SB.  Active ROM Right eval Left eval R Arom 02/13/23  Shoulder flexion 115 125 120  Shoulder extension     Shoulder abduction 85  120  Shoulder adduction     Shoulder internal rotation wfl  Sore and mild pain with behind the back   Shoulder external rotation wfl  Mild limitation/no pain, with behind the head  Elbow flexion wfl    Elbow extension wfl    Wrist flexion     Wrist extension     Wrist ulnar deviation     Wrist radial deviation     Wrist pronation     Wrist supination     (Blank rows = not tested)  UPPER EXTREMITY MMT:  LE: hips: 4-/5,  Knees: 4+/5   MMT Right eval Left eval R 02/13/23  Shoulder flexion 3-  3-  Shoulder extension     Shoulder abduction 3-  3-  Shoulder adduction     Shoulder internal rotation 4-  4  Shoulder external rotation 4-  4  Middle trapezius     Lower trapezius     Elbow flexion 4    Elbow extension 4    Wrist flexion     Wrist extension     Wrist ulnar deviation     Wrist radial deviation     Wrist pronation     Wrist supination     Grip strength (lbs)     (Blank rows = not tested)  JOINT MOBILITY TESTING:  Mild hypomobility of R shoulder   PALPATION:      TODAY'S TREATMENT:                                                                                                                                          DATE:   02/13/23: Therapeutic Exercise: Aerobic:  Supine:  S/L:     Seated:   Standing:  Ambulation with RW 45 ft x 8, with cuing for longer strike length and step height ;  Stretches:  Neuromuscular Re-education: Manual Therapy:   Self Care: Modalities:    12/05/22: Therapeutic Exercise: Aerobic:  Supine: Clams GTB x 20;  S/L:     Seated:   Standing:  Ambulation with RW 45 ft x 8, with cuing for longer strike length;     Stretches: Supine hip flexor stretch, trial for leg off end of table, and off side of table, better tolerated off side of table; x 3 bil each position;  Neuromuscular Re-education: Manual Therapy: Manual hip flexor and quad stretch on R, in s/l.  Self Care: Modalities:    PATIENT EDUCATION: Education details: reviewed HEP and PT POC. Person educated: Patient Education method: Explanation, Demonstration, Tactile cues, Verbal cues, and Handouts Education comprehension: verbalized understanding, returned demonstration, verbal cues required, tactile cues required, and needs further education Requires max cuing for exercises.   HOME EXERCISE PROGRAM: Access Code: [email or web address]   ASSESSMENT:  CLINICAL IMPRESSION: 5/40/9811 Re-Cert.  Pt last seen 3/7. She has only been seen for 8 prior visits. She presents today with some improvement of R shoulder pain and function. She does still have some pain with certain motions, but may be expected due to likely tear/condition of shoulder. She is able to use arm functionally. Bil knees continue to be stiff. Her main complaint is leg stiffness, as well as decreased ability for walking. She is reliant on walker, and will continue to use for safety. She has limited endurance and tolerance for walking and community activity, due to fatigue of legs and back. Today she is able to walk for 2.5 minutes  before needing to sit down. She will benefit from skilled PT with more focus on walking, stairs, safety with functional activity, and LE strength to improve ability for community activity. Goals updated today.   Re-Eval: 3/7:  Pt has been seen for 8 visits, she is able to come 1x/wk due to transportation. She is doing very well with compliance with HEP. She has continued stiffness in LEs, due to parkinsons, but with improving management of this. She has variable pain in R shoulder, with likely tear of RTC, also doing better with management of this. She has pain in R hip, that is limiting for mobility and standing/walking. Much stiffness in hip flexors and quads, will benefit from continued work on this, as well as gait mechanics and endurance. Pt tires easily with standing/walking with RW. Pt to benefit from continued care for education on management of symptoms and parkinsons, as well as improving functional activity and pain.     OBJECTIVE IMPAIRMENTS: Abnormal gait, decreased activity tolerance, decreased knowledge of use of DME, decreased mobility, difficulty walking, decreased ROM, decreased strength, decreased safety awareness, hypomobility, increased muscle spasms, impaired flexibility, impaired UE functional use, improper body mechanics, and pain.   ACTIVITY LIMITATIONS: carrying, lifting, stairs, self feeding, reach over head, and locomotion level  PARTICIPATION LIMITATIONS: meal prep, cleaning, shopping, and community activity  PERSONAL FACTORS: Time since onset of injury/illness/exacerbation and 1-2 comorbidities: parkinson's, mass in R arm,   are also affecting patient's functional outcome.   REHAB POTENTIAL: Good  CLINICAL DECISION MAKING: Evolving/moderate complexity  EVALUATION COMPLEXITY: Moderate   GOALS: Goals reviewed with patient? Yes   SHORT TERM GOALS: Target date: 10/24/2022  Pt to be independent with initial HEP for UE and LEs.   Goal status: MET  2.  Pt to be  independent with mobility for parkinsons and understand parkinsons implications for mobility.   Goal status: MET   LONG TERM GOALS:  Target date: 04/10/23   Pt to be independent with final HEP for arm, LEs and parkinsons.   Goal status: IN PROGRESS  2.  Pt to demo improved AROM /elevation for R shoulder to be equal to L, to improve ability for ADLS and IADLS.   Goal status: IN PROGRESS/partially met   3.  Pt to demo improved strength of R UE to be at least 4/5, to improve ability for reach, lift, carry and IADLS.   Goal status: IN PROGRESS/partially met   4.  Pt to demo improved strength of Bil knees to at least 4+/5 to improve ability for stairs , gait , and IADLs.   Goal status: MET  5.  Pt to demo gait mechanics to be safe and WNL for pt age and diagnosis, to improve ability and safety with community navigation.   Goal status: IN PROGRESS  6. Pt to demo ability for walking(with RW)  for at least 8-10 minutes safely, to improve endurance and ability  for community activity .   Goal status:  NEW      PLAN:  PT FREQUENCY: 1-2x/week  PT DURATION: 8 weeks  PLANNED INTERVENTIONS: Therapeutic exercises, Therapeutic activity, Neuromuscular re-education, Balance training, Gait training, Patient/Family education, Self Care, Joint mobilization, Joint manipulation, Stair training, DME instructions, Aquatic Therapy, Dry Needling, Electrical stimulation, Spinal manipulation, Spinal mobilization, Cryotherapy, Moist heat, Taping, Vasopneumatic device, Traction, Ultrasound, Ionotophoresis 4mg /ml Dexamethasone, and Manual therapy  PLAN FOR NEXT SESSION:  hip extension, LE strength, walking., stairs, walking endurance. Back pain.   Sedalia Muta, PT, DPT 1:38 PM  02/13/23  PHYSICAL THERAPY DISCHARGE SUMMARY  Visits from Start of Care: 9 Plan: Patient agrees to discharge.  Patient goals were not met. Patient is being discharged due to- not returning since last visit.      Sedalia Muta, PT, DPT 11:51 AM  04/28/23

## 2023-02-19 DIAGNOSIS — G20A1 Parkinson's disease without dyskinesia, without mention of fluctuations: Secondary | ICD-10-CM | POA: Diagnosis not present

## 2023-02-19 DIAGNOSIS — G44221 Chronic tension-type headache, intractable: Secondary | ICD-10-CM | POA: Diagnosis not present

## 2023-02-25 ENCOUNTER — Telehealth: Payer: Self-pay | Admitting: Orthopedic Surgery

## 2023-02-25 NOTE — Telephone Encounter (Signed)
Patient advising she want to speak  [name] ASAP, due to not having any relief

## 2023-02-25 NOTE — Telephone Encounter (Signed)
I called patient, she states that she is in severe pain, she states that her leg is getting worse. She states that the last injection did not do anything for her pain. Please advise

## 2023-02-26 ENCOUNTER — Telehealth: Payer: Medicare Other | Admitting: Psychiatry

## 2023-02-26 NOTE — Telephone Encounter (Signed)
I called and lmom advising her Dr. Buel Ream message

## 2023-02-27 ENCOUNTER — Other Ambulatory Visit: Payer: Self-pay | Admitting: Neurology

## 2023-02-27 ENCOUNTER — Telehealth: Payer: Self-pay | Admitting: Orthopedic Surgery

## 2023-02-27 NOTE — Telephone Encounter (Signed)
Patient wanting to know when Dr. Christell Constant is going to call her back.

## 2023-02-27 NOTE — Telephone Encounter (Signed)
I called her back  and advised her that I had left her message last night, she states that her phone is not working. I did advise her of Dr. Kathi Der message from yesterday. She is scheduled to see Dr. Roda Shutters tomorrow about her knee, and she sees pain management on 03/06/23 @ 145 pm

## 2023-02-28 ENCOUNTER — Telehealth: Payer: Self-pay | Admitting: Radiology

## 2023-02-28 ENCOUNTER — Ambulatory Visit: Payer: Medicare Other | Admitting: Orthopaedic Surgery

## 2023-02-28 ENCOUNTER — Telehealth: Payer: Self-pay

## 2023-02-28 NOTE — Telephone Encounter (Signed)
Patient left voicemail on triage line that she had appointment with Dr. Roda Shutters scheduled for this morning but had to cancel because she is sick and [facility] who provide transportation did not want to get her cold. She is having increased knee pian and swelling. She has used ice, heat, and is elevating the leg. She would like to know if there is something else that you can recommend she do?  CB [phone]

## 2023-02-28 NOTE — Telephone Encounter (Signed)
Tried to call patient back. No answer. Left message if she needs anything to call us back.

## 2023-02-28 NOTE — Telephone Encounter (Signed)
Patient would like a call back.  Did not state what she needed.  Cb# [phone].  Please advise.  Thank you.

## 2023-02-28 NOTE — Telephone Encounter (Signed)
Nothing to do right now.  She can follow-up for an appointment.

## 2023-02-28 NOTE — Telephone Encounter (Signed)
Tried to call patient. No answer. LMOM with information from Dr.Xu.

## 2023-03-04 ENCOUNTER — Ambulatory Visit: Payer: Medicare Other | Admitting: Podiatry

## 2023-03-05 ENCOUNTER — Other Ambulatory Visit (INDEPENDENT_AMBULATORY_CARE_PROVIDER_SITE_OTHER): Payer: Medicare Other

## 2023-03-05 ENCOUNTER — Ambulatory Visit: Payer: Medicare Other | Admitting: Orthopaedic Surgery

## 2023-03-05 DIAGNOSIS — G8929 Other chronic pain: Secondary | ICD-10-CM | POA: Diagnosis not present

## 2023-03-05 DIAGNOSIS — M25561 Pain in right knee: Secondary | ICD-10-CM

## 2023-03-05 NOTE — Progress Notes (Signed)
Office Visit Note   Patient: Kristin Gilmore           Date of Birth: Oct 14, 1950           MRN: [id number] Visit Date: 03/05/2023              Requested by: Jackelyn Poling, DO [address] [place],  [place] [address] PCP: Jackelyn Poling, DO   Assessment & Plan: Visit Diagnoses:  1. Chronic pain of right knee     Plan: Impression is 72 year old female with chronic right knee pain 2 years status post right total knee replacement.  Implant looks stable without any loosening or subsidence.  She has had a bone scan in the past which was normal.  I think her pain is part of her chronic pain syndrome and she is currently at [facility] pain clinic.  I encouraged her to do what she can in terms of strengthening.  I do not recommend injection or surgery.  Follow-up as needed.  Follow-Up Instructions: No follow-ups on file.   Orders:  Orders Placed This Encounter  Procedures   XR KNEE 3 VIEW RIGHT   No orders of the defined types were placed in this encounter.     Procedures: No procedures performed   Clinical Data: No additional findings.   Subjective: Chief Complaint  Patient presents with   Right Knee - Pain    HPI Kristin Gilmore comes in today for evaluation of chronic right knee pain.  Underwent total knee replacement almost 2 years ago.  She states that she has chronic pain.  Denies any constitutional symptoms.  States that the pain is preventing her from doing exercises and physical therapy.  She is not happy with the appearance of the knee also.  Denies ever injuring it.  Review of Systems  Constitutional: Negative.   HENT: Negative.    Eyes: Negative.   Respiratory: Negative.    Cardiovascular: Negative.   Endocrine: Negative.   Musculoskeletal: Negative.   Neurological: Negative.   Hematological: Negative.   Psychiatric/Behavioral: Negative.    All other systems reviewed and are negative.    Objective: Vital Signs: There were no vitals taken for this  visit.  Physical Exam Vitals and nursing note reviewed.  Constitutional:      Appearance: She is well-developed.  HENT:     Head: Atraumatic.     Nose: Nose normal.  Eyes:     Extraocular Movements: Extraocular movements intact.  Cardiovascular:     Pulses: Normal pulses.  Pulmonary:     Effort: Pulmonary effort is normal.  Abdominal:     Palpations: Abdomen is soft.  Musculoskeletal:     Cervical back: Neck supple.  Skin:    General: Skin is warm.     Capillary Refill: Capillary refill takes less than 2 seconds.  Neurological:     Mental Status: She is alert. Mental status is at baseline.  Psychiatric:        Behavior: Behavior normal.        Thought Content: Thought content normal.        Judgment: Judgment normal.     Ortho Exam Examination of the right knee shows a fully healed surgical scar.  Fluid range of motion without any pain.  Collaterals are stable.  No signs of infection.  Specialty Comments:  PER Dr. Karleen Dolphin patient needs to be seen for her RIGHT Knee- she should be following up with Dr. Roda Shutters   MRI LUMBAR SPINE WITHOUT AND WITH  CONTRAST    CONTRAST:  10mL GADAVIST GADOBUTROL 1 MMOL/ML IV SOLN   COMPARISON:  CT lumbar spine 04/22/2022, lumbar spine MRI 07/18/2021   FINDINGS: Segmentation: Standard; the lowest formed disc space is designated L5-S1.   Alignment: Levocurvature centered at L2 is unchanged. Trace anterolisthesis of L3 on L4 and 5 mm anterolisthesis of L4 on L5 is unchanged.   Vertebrae: Postsurgical changes reflecting posterior instrumented fusion at L3 through L5 with interbody spacers are again seen. Lucency around the L5 screws seen on the prior CT is not appreciated by MRI.   Background marrow signal is within normal limits. There is degenerative endplate marrow signal abnormality with edema but no enhancement at L2-L3 which is increased since the preoperative study from 2022. There is no other marrow edema. There is no  abnormal marrow enhancement.   Conus medullaris and cauda equina: Conus extends to the L1 level. Conus and cauda equina appear normal. There is no abnormal enhancement of the conus or cauda equina nerve roots.   Paraspinal and other soft tissues: There are postsurgical changes in the soft tissues posterior to the surgical levels. There is no suspicious fluid collection. The endometrium is distended measuring up to 8 mm in thickness.   Disc levels:   T12-L1: No significant spinal canal or neural foraminal stenosis   L1-L2: No significant spinal canal or neural foraminal stenosis.   L2-L3: There is disc desiccation and narrowing with a diffuse disc bulge and small superiorly migrated extrusion, and moderate bilateral facet arthropathy resulting in moderate spinal canal stenosis with subarticular zone narrowing and possible irritation of either traversing L3 nerve root, and no significant neural foraminal stenosis. Compared to the preoperative study from 2022, the spinal canal stenosis has worsened.   L3-L4: Status post posterior instrumented fusion and decompression. There is no residual spinal canal stenosis. There is mild residual right worse than left neural foraminal stenosis without convincing evidence of nerve root compression.   L4-L5: Status post posterior instrumented fusion and decompression. There is grade 1 anterolisthesis with uncovering of the disc posteriorly and bilateral facet arthropathy. There is no residual spinal canal stenosis; however, there is severe left and mild-to-moderate right neural foraminal stenosis with compression of the exiting left L4 nerve root (5-11).   L5-S1: There is advanced disc desiccation and narrowing with a disc bulge and central protrusion and annular fissure, moderate bilateral facet arthropathy resulting in severe left worse than right neural foraminal stenosis without significant spinal canal stenosis. There is no evidence of  compression of the traversing S1 nerve roots as was questioned on prior CT.   IMPRESSION: 1. Status post posterior instrumented fusion and decompression at L3 through L5 without residual spinal canal stenosis at the surgical levels; however, there is severe left and mild-to-moderate right neural foraminal stenosis at L4-L5 with compression of the exiting left L4 nerve root. No other convincing nerve root compression at the surgical levels. 2. 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 3. Unchanged advanced disc degeneration and moderate facet arthropathy at L5-S1 with severe left worse than right neural foraminal stenosis. No evidence of compression of the traversing S1 nerve roots as was questioned on prior CT. 4. Distended endometrium measuring up to 8 mm. Recommend correlation with pelvic ultrasound given patient age.     Electronically Signed   By: Selena Lesser.D.  On: 05/30/2022 11:16  Imaging: XR KNEE 3 VIEW RIGHT  Result Date: 03/05/2023 Stable right total knee replacement without complication.    PMFS History: Patient Active Problem List   Diagnosis Date Noted   Acute pain of right shoulder 07/02/2022   Lumbar radicular pain    AKI (acute kidney injury) (HCC)    Bursitis of right elbow    Bacteremia due to methicillin susceptible Staphylococcus aureus (MSSA) 05/29/2022   SIRS (systemic inflammatory response syndrome) (HCC) 05/28/2022   Bipolar disorder, in partial remission, most recent episode hypomanic (HCC) 05/28/2022   Hyperkalemia 05/28/2022   Dehydration 05/28/2022   Essential hypertension 05/28/2022   Intractable back pain 05/27/2022   Other spondylosis with radiculopathy, lumbar region 10/30/2021   Other secondary scoliosis, lumbar region 10/30/2021    Spondylolisthesis, lumbar region 10/30/2021   Spondylolisthesis of lumbosacral region 10/30/2021   Status post total right knee replacement 06/26/2021   Primary osteoarthritis of right knee 06/25/2021   Parkinson's disease 01/24/2021   Trochanteric bursitis, left hip 01/18/2020   Lumbar spinal stenosis 12/19/2017   Pain in joint, ankle and foot 05/02/2016   Chronic venous insufficiency 05/02/2016   Peripheral edema 11/01/2015   Intractable chronic migraine without aura 11/02/2014   Tremor 04/13/2014   Migraine without aura 04/13/2014   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   Pneumonia    Pre-diabetes    "i was told i was pre-diabetic a while ago"   Tremor 04/13/2014    Family History  Problem Relation Age of Onset   Cancer Father        stomach cancer   Migraines Mother     Past Surgical History:  Procedure Laterality Date   CHOLECYSTECTOMY     COLONOSCOPY W/ POLYPECTOMY     LUMBAR LAMINECTOMY  07/2020   3 level decompression - [place], [place]   LUMBAR LAMINECTOMY/DECOMPRESSION MICRODISCECTOMY N/A 12/19/2017   Procedure: LAMINECTOMY LUMBAR TWO- LUMBAR THREE, LUMBAR THREE- LUMBAR FOUR, LUMBAR FOUR- LUMBAR FIVE ;  Surgeon: Lisbeth Renshaw, MD;  Location: [facility] OR;  Service: Neurosurgery;  Laterality: N/A;   NASAL SINUS SURGERY     RADIOLOGY WITH ANESTHESIA N/A 05/30/2022   Procedure: MRI WITH ANESTHESIA LUMBAR SPINE W/WO CONSTRAST;  Surgeon: Radiologist, Medication, MD;  Location: [facility] OR;  Service: Radiology;  Laterality: N/A;   TEE WITHOUT CARDIOVERSION N/A 06/05/2022   Procedure: TRANSESOPHAGEAL ECHOCARDIOGRAM  (TEE);  Surgeon: Chrystie Nose, MD;  Location: [facility] ENDOSCOPY;  Service: Cardiovascular;  Laterality: N/A;   TONSILLECTOMY     TOTAL KNEE ARTHROPLASTY Right 06/26/2021   Procedure: RIGHT TOTAL KNEE ARTHROPLASTY;  Surgeon: Tarry Kos, MD;  Location: [facility] OR;  Service: Orthopedics;  Laterality: Right;   WISDOM TOOTH EXTRACTION     Social History   Occupational History   Occupation: Retired  Tobacco Use   Smoking status: Never   Smokeless tobacco: Never  Vaping Use   Vaping Use: Never used  Substance and Sexual Activity   Alcohol use: Yes    Comment: occasional glass of wine   Drug use: No   Sexual activity: Not on file

## 2023-03-06 ENCOUNTER — Encounter: Payer: Medicare Other | Admitting: Physical Therapy

## 2023-03-07 DIAGNOSIS — G894 Chronic pain syndrome: Secondary | ICD-10-CM | POA: Diagnosis not present

## 2023-03-07 DIAGNOSIS — R030 Elevated blood-pressure reading, without diagnosis of hypertension: Secondary | ICD-10-CM | POA: Diagnosis not present

## 2023-03-07 DIAGNOSIS — Z79899 Other long term (current) drug therapy: Secondary | ICD-10-CM | POA: Diagnosis not present

## 2023-03-07 DIAGNOSIS — M545 Low back pain, unspecified: Secondary | ICD-10-CM | POA: Diagnosis not present

## 2023-03-07 DIAGNOSIS — Z6829 Body mass index (BMI) 29.0-29.9, adult: Secondary | ICD-10-CM | POA: Diagnosis not present

## 2023-03-07 DIAGNOSIS — M5430 Sciatica, unspecified side: Secondary | ICD-10-CM | POA: Diagnosis not present

## 2023-03-10 ENCOUNTER — Ambulatory Visit: Payer: Medicare Other | Admitting: Orthopedic Surgery

## 2023-03-10 ENCOUNTER — Other Ambulatory Visit (INDEPENDENT_AMBULATORY_CARE_PROVIDER_SITE_OTHER): Payer: Medicare Other

## 2023-03-10 DIAGNOSIS — M25551 Pain in right hip: Secondary | ICD-10-CM

## 2023-03-10 DIAGNOSIS — M5416 Radiculopathy, lumbar region: Secondary | ICD-10-CM

## 2023-03-10 MED ORDER — DIAZEPAM 5 MG PO TABS: 5.0000 mg | ORAL_TABLET | Freq: Once | ORAL | 0 refills | Status: DC | PRN

## 2023-03-10 MED ORDER — PREGABALIN 150 MG PO CAPS: 150.0000 mg | ORAL_CAPSULE | Freq: Every day | ORAL | 0 refills | Status: DC

## 2023-03-10 MED ORDER — METHYLPREDNISOLONE 4 MG PO TBPK: ORAL_TABLET | ORAL | 0 refills | Status: DC

## 2023-03-10 NOTE — Progress Notes (Signed)
Orthopedic Spine Surgery Office Note  Assessment: Patient is a 72 y.o. female with acute exacerbation of chronic radicular pain   Plan: -Patient has tried PT, activity modification, over-the-counter medications, steroid injections, pain management -Recommended EMG/NCS to evaluate further -Prescribed Medrol Dosepak and Lyrica for additional pain relief -Encouraged her to continue to follow with pain management -I told her that I wanted to get the EMG/NCS since the injection was not helpful and so it did not provide diagnostic information -Patient should return to office in 4 weeks, x-rays at next visit: None   Patient expressed understanding of the plan and all questions were answered to the patient's satisfaction.   ___________________________________________________________________________  History: Patient is a 72 y.o. female who has been previously seen in the office for symptoms consistent with lumbar radiculopathy.  She did not get significant relief with prior injection at L5/S1 with Dr. Alvester Morin.  She comes in today because she has had worsening pain in her right lower extremity.  She feels it starts in her low back and radiates into the lateral aspect of her thigh.  It goes to the level of the knee.  It does not radiate past the knee.  She does not have any symptoms on the left lower extremity.  She still has pain even with her hydrocodone from pain management.  Denies paresthesias and numbness.  There is no recent trauma or injury that preceded this worsening of her pain.  Previous treatments: PT, activity modification, over-the-counter medications, steroid injections, pain management  Physical Exam:  General: no acute distress, appears stated age Neurologic: alert, answering questions appropriately, following commands Respiratory: unlabored breathing on room air, symmetric chest rise Psychiatric: appropriate affect, normal cadence to speech   MSK (spine):  -Strength  exam      Left  Right EHL    5/5  5/5 TA    5/5  5/5 GSC    5/5  5/5 Knee extension  5/5  5/5 Hip flexion   5/5  5/5  -Sensory exam    Sensation intact to light touch in L3-S1 nerve distributions of bilateral lower extremities  -Straight leg raise: Negative bilaterally -Femoral nerve stretch test: Negative bilaterally -Clonus: no beats bilaterally  Imaging: XR of the right hip from 03/10/2023 was independently reviewed and interpreted, showing valgus alignment.  Small cam deformity seen.  No fracture or dislocation.  Joint space narrowing seen but no other significant degenerative changes.  XR of the lumbar spine from 07/15/2022 was previously independently reviewed and interpreted, showing interbody devices at L3/4 and L4/5 and pedicle screws at L3, L4, L5. There is no lucency seen around the screws or interbody devices. Lumbar scoliosis with apex at L2 - measures 28 degrees. Disc height loss at L2/3 and L5/S1.    CT of the lumbar spine from 04/22/2022 shows vacuum disc at L5/S1 and L2/3. On the coronal, it appears the inferior facet of L2 is not present and there is translation at the level of the facet joint. Subtle lucency around the L3 screws. More obvious lucency around the L5 screws.    MRI of the lumbar spine from 05/30/2022 was previously independently reviewed and interpreted, showing L2/3 lateral recess and central stenosis. Foraminal stenosis at L4/5 and L5/S1. Central disc herniation at L5/S1.    Patient name: Kristin Gilmore Patient MRN: [id number] Date of visit: 03/10/23

## 2023-03-11 DIAGNOSIS — Z79899 Other long term (current) drug therapy: Secondary | ICD-10-CM | POA: Diagnosis not present

## 2023-03-13 ENCOUNTER — Ambulatory Visit: Payer: Medicare Other | Admitting: Psychiatry

## 2023-03-13 ENCOUNTER — Telehealth: Payer: Self-pay | Admitting: Rehabilitative and Restorative Service Providers"

## 2023-03-13 ENCOUNTER — Encounter: Payer: Medicare Other | Admitting: Physical Therapy

## 2023-03-13 NOTE — Telephone Encounter (Signed)
Pt called requesting a call back from [facility]. Pt did not leave a reason for call back. Please call pt at [phone].

## 2023-03-14 ENCOUNTER — Telehealth: Payer: Self-pay | Admitting: Orthopedic Surgery

## 2023-03-14 NOTE — Telephone Encounter (Signed)
See new message

## 2023-03-14 NOTE — Telephone Encounter (Signed)
Pt called requesting I more valium. She states she need to for her upcoming procedure. Please send to pharmacy on file. Also medication he put her on is helping with volcarn is helping with her pain and dosage package with pain meds is helping a lot. Pt phone [phone].

## 2023-03-14 NOTE — Telephone Encounter (Signed)
I tried to call, but VM was full. She must leave a detailed message as to what she is calling about

## 2023-03-17 ENCOUNTER — Ambulatory Visit: Payer: Medicare Other | Admitting: Orthopedic Surgery

## 2023-03-17 NOTE — Telephone Encounter (Signed)
Tried to call VM is full, need to know what procedure she is having--she needs to get medication from Dr. Claiborne Billings ordered the procedure.

## 2023-03-18 ENCOUNTER — Ambulatory Visit (INDEPENDENT_AMBULATORY_CARE_PROVIDER_SITE_OTHER): Payer: Medicare Other

## 2023-03-18 ENCOUNTER — Telehealth: Payer: Self-pay | Admitting: Orthopedic Surgery

## 2023-03-18 ENCOUNTER — Ambulatory Visit: Payer: Medicare Other | Admitting: Podiatry

## 2023-03-18 DIAGNOSIS — M2062 Acquired deformities of toe(s), unspecified, left foot: Secondary | ICD-10-CM

## 2023-03-18 DIAGNOSIS — M779 Enthesopathy, unspecified: Secondary | ICD-10-CM

## 2023-03-18 DIAGNOSIS — B351 Tinea unguium: Secondary | ICD-10-CM | POA: Diagnosis not present

## 2023-03-18 DIAGNOSIS — M79675 Pain in left toe(s): Secondary | ICD-10-CM

## 2023-03-18 DIAGNOSIS — M79674 Pain in right toe(s): Secondary | ICD-10-CM | POA: Diagnosis not present

## 2023-03-18 MED ORDER — DIAZEPAM 5 MG PO TABS: 5.0000 mg | ORAL_TABLET | Freq: Once | ORAL | 0 refills | Status: DC | PRN

## 2023-03-18 MED ORDER — CICLOPIROX 8 % EX SOLN: Freq: Every day | CUTANEOUS | 2 refills | Status: DC

## 2023-03-18 NOTE — Telephone Encounter (Signed)
Patient called asked if she can get another Valium before she can get the NCS. Patient said she can't relax taking 1 tab.  Patient asked what office will be calling her for NCS.  The number to contact patient is [phone]

## 2023-03-19 NOTE — Telephone Encounter (Signed)
See other message

## 2023-03-20 ENCOUNTER — Encounter: Payer: Medicare Other | Admitting: Physical Therapy

## 2023-03-20 ENCOUNTER — Telehealth: Payer: Self-pay | Admitting: Orthopedic Surgery

## 2023-03-20 MED ORDER — GABAPENTIN 300 MG PO CAPS: 300.0000 mg | ORAL_CAPSULE | Freq: Three times a day (TID) | ORAL | 2 refills | Status: DC

## 2023-03-20 NOTE — Telephone Encounter (Signed)
Patient called asked if she can get a Rx for Gabapentin for her back.  Patient uses [facility] on [address]. The number to contact patient is [phone]

## 2023-03-20 NOTE — Telephone Encounter (Signed)
Sent referral over to [facility], Dr. Larna Daughters office

## 2023-03-21 ENCOUNTER — Encounter: Payer: Self-pay | Admitting: Physical Medicine & Rehabilitation

## 2023-03-21 NOTE — Telephone Encounter (Signed)
Tried to call vm was full

## 2023-03-25 ENCOUNTER — Telehealth: Payer: Self-pay | Admitting: Orthopedic Surgery

## 2023-03-25 NOTE — Telephone Encounter (Signed)
Patient called asked for a call back concerning her NCS. Patient said she haven't heard from anyone yet to schedule her.   The number to contact patient is [phone]

## 2023-03-25 NOTE — Telephone Encounter (Signed)
I called and advised that he only sent in the 300mg  Capsules, however, she can still use the 100mg  caps she will just need to take 3-100mg  caps 3 times a day.  She states that she understands this.

## 2023-03-25 NOTE — Telephone Encounter (Signed)
Ms. Schoff would like some clarification her gabapentin Rx.  She states that her nurse's aid went to the pharmacy to pick up her prescriptions and she brought home 2 gabapentin Rxs one for 100mg  and one for 300mg . She states that they both have Dr. Christell Constant as the prescribing physician.

## 2023-03-25 NOTE — Progress Notes (Signed)
Subjective:   Patient ID: Kristin Gilmore, female   DOB: 72 y.o.   MRN: [id number]   HPI Chief Complaint  Patient presents with   Nail Problem    Nail Trim. Patient starts that her Left foot hallux toe is starting to turn inward. Has been going on for the last 6 months.    Female presents the office for above concerns.  She states that the nails are thickened elongated she is having difficulty trimming them are causing discomfort and she also uses her left big toe started to turn inwards.  This meeting was the last 6 months.  No injuries that she reports.  No recent treatment otherwise.  No other concerns.   Review of Systems  All other systems reviewed and are negative.  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   Pneumonia    Pre-diabetes    "i was told i was pre-diabetic a while ago"   Tremor 04/13/2014    Past Surgical History:  Procedure Laterality Date   CHOLECYSTECTOMY     COLONOSCOPY W/ POLYPECTOMY     LUMBAR LAMINECTOMY  07/2020   3 level decompression - [place], [place]   LUMBAR LAMINECTOMY/DECOMPRESSION MICRODISCECTOMY N/A 12/19/2017   Procedure: LAMINECTOMY LUMBAR TWO- LUMBAR THREE, LUMBAR THREE- LUMBAR FOUR, LUMBAR FOUR- LUMBAR FIVE ;  Surgeon: Lisbeth Renshaw, MD;  Location: [facility] OR;  Service: Neurosurgery;  Laterality: N/A;   NASAL SINUS SURGERY     RADIOLOGY WITH ANESTHESIA N/A 05/30/2022   Procedure: MRI WITH ANESTHESIA LUMBAR SPINE W/WO CONSTRAST;  Surgeon: Radiologist, Medication, MD;  Location: [facility] OR;  Service: Radiology;  Laterality: N/A;   TEE WITHOUT CARDIOVERSION N/A 06/05/2022   Procedure: TRANSESOPHAGEAL ECHOCARDIOGRAM (TEE);   Surgeon: Chrystie Nose, MD;  Location: [facility] ENDOSCOPY;  Service: Cardiovascular;  Laterality: N/A;   TONSILLECTOMY     TOTAL KNEE ARTHROPLASTY Right 06/26/2021   Procedure: RIGHT TOTAL KNEE ARTHROPLASTY;  Surgeon: Tarry Kos, MD;  Location: [facility] OR;  Service: Orthopedics;  Laterality: Right;   WISDOM TOOTH EXTRACTION       Current Outpatient Medications:    ciclopirox (PENLAC) 8 % solution, Apply topically at bedtime. Apply over nail and surrounding skin. Apply daily over previous coat. After seven (7) days, may remove with alcohol and continue cycle., Disp: 6.6 mL, Rfl: 2   ALPRAZolam (XANAX) 0.25 MG tablet, Take 0.25 mg by mouth at bedtime as needed for anxiety. (Patient not taking: Reported on 01/02/2023), Disp: , Rfl:    amitriptyline (ELAVIL) 10 MG tablet, Take 1 tablet (10 mg total) by mouth at bedtime as needed for sleep. (Patient taking differently: Take 10 mg by mouth at bedtime as needed for sleep. For HA), Disp: 270 tablet, Rfl: 1   amLODipine (NORVASC) 5 MG tablet, Take 5 mg by mouth 2 (two) times daily., Disp: , Rfl:    aspirin EC 81 MG tablet, Take 1 tablet (81 mg total) by mouth 2 (two) times daily. (Patient not taking: Reported on 01/02/2023), Disp: 84 tablet, Rfl: 0   Biotin 5000 MCG CAPS, Take 5,000 mcg by mouth every morning. (Patient not taking: Reported on 01/02/2023), Disp: , Rfl:  bisacodyl (DULCOLAX) 5 MG EC tablet, Take 5-15 mg by mouth daily as needed (constipation). (Patient not taking: Reported on 01/02/2023), Disp: , Rfl:    Calcium Citrate-Vitamin D (CALCIUM CITRATE + D PO), Take 1-2 capsules by mouth See admin instructions. Calcium 200 mg, vitamin D3 2.5 mcg - 100 units - take 1 tablet by mouth with breakfast and 2 tablets with supper (Patient not taking: Reported on 01/02/2023), Disp: , Rfl:    carbidopa-levodopa (SINEMET IR) 25-100 MG tablet, Take 1 tablet by mouth 3 (three) times daily., Disp: , Rfl:    carvedilol (COREG) 25 MG tablet, Take 12.5 mg by mouth 2 (two) times  daily with a meal., Disp: , Rfl:    Cholecalciferol (VITAMIN D3) 2000 UNITS TABS, Take 4,000 Units by mouth daily with supper. (Patient not taking: Reported on 01/02/2023), Disp: , Rfl:    Coenzyme Q10 (COQ10) 100 MG CAPS, Take 100 mg by mouth daily with supper. (Patient not taking: Reported on 01/02/2023), Disp: , Rfl:    diazepam (VALIUM) 5 MG tablet, Take 1 tablet (5 mg total) by mouth once as needed for up to 1 dose (prior to nerve study)., Disp: 1 tablet, Rfl: 0   dicyclomine (BENTYL) 10 MG capsule, Take 1 capsule (10 mg total) by mouth 3 (three) times daily as needed for spasms. (Patient not taking: Reported on 01/02/2023), Disp: , Rfl:    divalproex (DEPAKOTE ER) 250 MG 24 hr tablet, Take 3 tablets (750 mg total) by mouth daily., Disp: 270 tablet, Rfl: 1   docusate sodium (COLACE) 100 MG capsule, Take 300 mg by mouth at bedtime as needed (constipation). (Patient not taking: Reported on 01/02/2023), Disp: , Rfl:    ferrous sulfate 325 (65 FE) MG EC tablet, Take 325 mg by mouth See admin instructions. Take one tablet (325) mg) by mouth every afternoon on an empty stomach (Patient not taking: Reported on 01/02/2023), Disp: , Rfl:    gabapentin (NEURONTIN) 300 MG capsule, Take 1 capsule (300 mg total) by mouth 3 (three) times daily., Disp: 90 capsule, Rfl: 2   Glucosamine-Chondroitin 500-400 MG CAPS, Take 1-2 capsules by mouth See admin instructions. Take one capsule by mouth every morning, and two capsules at night (Patient not taking: Reported on 01/02/2023), Disp: , Rfl:    ibuprofen (ADVIL) 200 MG tablet, Take 400 mg by mouth daily as needed for headache. (Patient not taking: Reported on 01/02/2023), Disp: , Rfl:    Lactobacillus (LACTINEX PO), Take 1 tablet by mouth daily. (Patient not taking: Reported on 01/02/2023), Disp: , Rfl:    loperamide (IMODIUM) 2 MG capsule, Take 2 mg by mouth 4 (four) times daily as needed for diarrhea or loose stools. (Patient not taking: Reported on 01/02/2023), Disp: , Rfl:     losartan (COZAAR) 100 MG tablet, Take 100 mg by mouth daily. (Patient not taking: Reported on 01/02/2023), Disp: , Rfl:    MAGNESIUM CITRATE PO, Take 150 mg by mouth at bedtime. (Patient not taking: Reported on 01/02/2023), Disp: , Rfl:    Magnesium Oxide 500 MG TABS, Take 400 mg by mouth at bedtime. (Patient not taking: Reported on 01/02/2023), Disp: , Rfl:    Menthol, Topical Analgesic, (BIOFREEZE) 4 % GEL, Apply 1 application topically 4 (four) times daily as needed (pain). (Patient not taking: Reported on 01/02/2023), Disp: , Rfl:    methocarbamol (ROBAXIN) 500 MG tablet, Take 1 tablet (500 mg total) by mouth every 8 (eight) hours as needed for muscle spasms. (Patient not taking: Reported  on 01/02/2023), Disp: 30 tablet, Rfl: 1   methylPREDNISolone (MEDROL DOSEPAK) 4 MG TBPK tablet, Take as prescribed on the box, Disp: 21 tablet, Rfl: 0   mupirocin ointment (BACTROBAN) 2 %, Place 1 application into the nose 3 (three) times daily. (Patient not taking: Reported on 01/02/2023), Disp: , Rfl:    Omega-3 Fatty Acids (FISH OIL) 1000 MG CAPS, Take 1,000 mg by mouth 2 (two) times daily. (Patient not taking: Reported on 01/02/2023), Disp: , Rfl:    OVER THE COUNTER MEDICATION, Take 1 capsule by mouth 2 (two) times daily. PETADOLEX - Health brain blood vessel relaxation (butterburr root extract) (Patient not taking: Reported on 01/02/2023), Disp: , Rfl:    OVER THE COUNTER MEDICATION, Apply 1 application  topically daily. Leg & back pain relief cream (Patient not taking: Reported on 01/02/2023), Disp: , Rfl:    pantoprazole (PROTONIX) 40 MG tablet, Take 40 mg by mouth 2 (two) times daily. (Patient not taking: Reported on 01/02/2023), Disp: , Rfl:    potassium chloride (KLOR-CON) 10 MEQ tablet, Take 10 mEq by mouth daily. (Patient not taking: Reported on 01/02/2023), Disp: , Rfl:    Probiotic Product (PROBIOTIC PO), Take 1 capsule by mouth daily with breakfast. SUPREMA DOPHILUS 5 BILLION CFU (Patient not taking: Reported on  01/02/2023), Disp: , Rfl:    QUEtiapine (SEROQUEL) 300 MG tablet, Take 0.5 tablets (150 mg total) by mouth at bedtime as needed., Disp: 45 tablet, Rfl: 0   Red Yeast Rice 600 MG CAPS, Take 1,200 mg by mouth 2 (two) times daily. (Patient not taking: Reported on 01/02/2023), Disp: , Rfl:    Simethicone 180 MG CAPS, Take 180 mg by mouth daily as needed (gas/bloating). (Patient not taking: Reported on 01/02/2023), Disp: , Rfl:    sodium chloride (OCEAN) 0.65 % SOLN nasal spray, Place 1 spray into both nostrils 3 (three) times daily. (Patient not taking: Reported on 01/02/2023), Disp: , Rfl:    SUMAtriptan (IMITREX) 100 MG tablet, Take 1 tablet (100 mg total) by mouth 2 (two) times daily as needed for up to 28 days for migraine. May repeat in 2 hours if headache persists or recurs., Disp: 10 tablet, Rfl: 0   telmisartan (MICARDIS) 80 MG tablet, Take 80 mg by mouth at bedtime., Disp: , Rfl:    tolterodine (DETROL LA) 4 MG 24 hr capsule, Take 4 mg by mouth daily with breakfast. (Patient not taking: Reported on 12/31/2022), Disp: , Rfl:    trihexyphenidyl (ARTANE) 2 MG tablet, Take 1 tablet (2 mg total) by mouth 3 (three) times daily with meals. (Patient not taking: Reported on 12/31/2022), Disp: 270 tablet, Rfl: 0   trihexyphenidyl (ARTANE) 2 MG tablet, Take 1 tablet (2 mg total) by mouth 3 (three) times daily with meals. (Patient not taking: Reported on 12/31/2022), Disp: 60 tablet, Rfl: 1   Turmeric 500 MG CAPS, Take 500 mg by mouth 2 (two) times daily with a meal. (Patient not taking: Reported on 01/02/2023), Disp: , Rfl:   Allergies  Allergen Reactions   Levofloxacin Other (See Comments)     Dizziness (intolerance)   Amoxicillin Hives    UNSPECIFIED REACTION  Has patient had a PCN reaction causing immediate rash, facial/tongue/throat swelling, SOB or lightheadedness with hypotension: Unknown Has patient had a PCN reaction causing severe rash involving mucus membranes or skin necrosis: Unknown Has patient had a PCN  reaction that required hospitalization: Unknown Has patient had a PCN reaction occurring within the last 10 years: No If all of  the above answers are "NO", then may proceed with Cephalosporin use. Sweats     Clarithromycin Hives, Other (See Comments) and Rash   Atropine Hives   Fish Allergy Other (See Comments)    Childhood reaction. Pt denies further reaction.          Objective:  Physical Exam  General: AAO x3, NAD  Dermatological: Nails are hypertrophic, dystrophic, brittle, discolored, elongated 10. No surrounding redness or drainage. Tenderness nails 1-5 bilaterally. No open lesions or pre-ulcerative lesions are identified today.  Vascular: Dorsalis Pedis artery and Posterior Tibial artery pedal pulses are palpable bilateral with immedate capillary fill time. There is no pain with calf compression, swelling, warmth, erythema.   Neruologic: Grossly intact via light touch bilateral.   Musculoskeletal: There is minimal hallux abduction noted.  Fracture of the nail she states that the toe looks better as far as the position.  There is no significant pain associated with this.  No edema or erythema.  MMT 5/5.      Assessment:   72 year old female with symptomatic onychomycosis, minimal hallux abduction.      Plan:  -Treatment options discussed including all alternatives, risks, and complications -Etiology of symptoms were discussed -X-rays were obtained reviewed of the left foot.  3 views of the foot were obtained.  There is no evidence of acute fracture.  Very slight hallux abduction is noted. -Sharply debrided the nails x 10 without any complications or bleeding.  She is insistent on treatment options.  Prescribed Penlac. -Discussed shoes, good arch supports to use toe spacers as needed.  Vivi Barrack DPM

## 2023-03-26 ENCOUNTER — Telehealth: Payer: Self-pay | Admitting: Orthopedic Surgery

## 2023-03-26 NOTE — Telephone Encounter (Signed)
Patient is aware that Dr. Larna Daughters office will call to sched

## 2023-03-26 NOTE — Telephone Encounter (Signed)
I called and spoke with patient, she is complaining about how much she is hurting in her hip, back and in the right shoulder. I advised her to use ice on them and if she would like she could try heat as well. She was complaining of being out of breath, I advised her that if this is the case that she needs to get to the ER and make sure she don't have a PE, she was adamant that she was not going to the ER that she did not want to pay for the EMS bill, that she was still paying on the last bill. However, she advised mr that she was on her to the pool as she felt like it would help her. She states that she had a hard time yesterday after going to the pool trying to pull up her bathing suit bottoms with her right arm due to her shoulder pain. She also asked me about getting her Home owners to put her up a handicap parking spot that would be closer to her home, I advised that she would need to contact the [facility] for this, she states that she had and there has been no response, I advised that she needs to keep contacting them till they do something about it. She's states that she is so tired of the pain that she has been putting up with it for over a year.

## 2023-03-26 NOTE — Telephone Encounter (Signed)
Pt called requesting a call back right away. Pt states she can't walk right now. Pt is asking for a call from [place] right away. Please call pt at [phone].

## 2023-03-27 ENCOUNTER — Encounter: Payer: Medicare Other | Admitting: Physical Therapy

## 2023-03-28 ENCOUNTER — Telehealth: Payer: Self-pay

## 2023-03-28 NOTE — Telephone Encounter (Signed)
Patient is supposed to be going to Dr. Wynn Banker office for it

## 2023-03-28 NOTE — Telephone Encounter (Signed)
Patient left a voicemail concerning her NCV. Spoke with Dr. Alvester Morin and he stated he informed Dr. Christell Constant that he could not do it. Is she set up to go somewhere else?

## 2023-03-31 ENCOUNTER — Telehealth: Payer: Self-pay | Admitting: Orthopedic Surgery

## 2023-03-31 NOTE — Telephone Encounter (Signed)
Ms. Cansler called in to inquire about the Voltaren she is taking.  Per patient, the packaging says to use for 21 days and then stop.  She would like to know if Dr. Christell Constant wants her to continue.  However, she states that it is not helping her knee pain it is "stiffer than ever" and the pain is worse.  Also, can she now continue with her exercises.  Please call to discuss [phone].

## 2023-03-31 NOTE — Telephone Encounter (Signed)
I called and lmom for her to stop the meds and to call her pain management MD and have him to eval her

## 2023-04-02 ENCOUNTER — Telehealth: Payer: Self-pay | Admitting: Orthopedic Surgery

## 2023-04-02 NOTE — Telephone Encounter (Signed)
Patient called. Would like [name] to call her back.

## 2023-04-02 NOTE — Telephone Encounter (Signed)
I called and lmom, for pt that if she is in a lot of pain that she should contact her pain management, as far as [place] we have to wait for her to get her EMG/NCS to figure out what we can do for her next.

## 2023-04-04 ENCOUNTER — Telehealth: Payer: Self-pay | Admitting: Orthopedic Surgery

## 2023-04-04 NOTE — Telephone Encounter (Signed)
Pt called in stating the facility who we referred her too cannot get her scheduled until 08/15 and they advised if she didn't want to wait we would need to refer her to another clinic for her testing

## 2023-04-08 DIAGNOSIS — Z6830 Body mass index (BMI) 30.0-30.9, adult: Secondary | ICD-10-CM | POA: Diagnosis not present

## 2023-04-08 DIAGNOSIS — E559 Vitamin D deficiency, unspecified: Secondary | ICD-10-CM | POA: Diagnosis not present

## 2023-04-08 DIAGNOSIS — R030 Elevated blood-pressure reading, without diagnosis of hypertension: Secondary | ICD-10-CM | POA: Diagnosis not present

## 2023-04-08 DIAGNOSIS — M545 Low back pain, unspecified: Secondary | ICD-10-CM | POA: Diagnosis not present

## 2023-04-08 DIAGNOSIS — M5430 Sciatica, unspecified side: Secondary | ICD-10-CM | POA: Diagnosis not present

## 2023-04-08 DIAGNOSIS — G894 Chronic pain syndrome: Secondary | ICD-10-CM | POA: Diagnosis not present

## 2023-04-10 ENCOUNTER — Telehealth: Payer: Self-pay | Admitting: Orthopedic Surgery

## 2023-04-10 ENCOUNTER — Ambulatory Visit: Payer: Medicare Other | Admitting: Orthopedic Surgery

## 2023-04-10 NOTE — Telephone Encounter (Signed)
Patient called asked if Kristin Gilmore would call her back. Patient said she had to cancel her appointment due to being sick with a migraine headache.  The number to contact patient is [phone]

## 2023-04-10 NOTE — Telephone Encounter (Signed)
Tried calling pt back. Mailbox was full  

## 2023-04-10 NOTE — Telephone Encounter (Signed)
Tried calling pt back. Mailbox was full

## 2023-04-10 NOTE — Telephone Encounter (Signed)
Patient would like [name] to call her.  ?

## 2023-04-10 NOTE — Telephone Encounter (Signed)
Has questions for Dr. Christell Constant Seeing pain management but her pain management MD is leaving. She wants to know if Dr Christell Constant can manage her medications instead of getting another pain management doctor-she takes hydrocodone and meloxicam She also needs a medical summary letter as to what her disabilities are so she can get a handicap parking spot at the pool She also needs her handicap renewed. Is this ok? Will she need another appt to get this stuff done?

## 2023-04-11 ENCOUNTER — Telehealth: Payer: Self-pay | Admitting: Orthopedic Surgery

## 2023-04-11 ENCOUNTER — Other Ambulatory Visit: Payer: Self-pay | Admitting: Orthopedic Surgery

## 2023-04-11 NOTE — Telephone Encounter (Signed)
I called and talked to the pt. She doesn't have a recent handicap form to take to the [facility]. She would like the handicap form,letter and last office note mailed to her. Since you are with him can you please take care of this?

## 2023-04-11 NOTE — Telephone Encounter (Signed)
Note completed 

## 2023-04-11 NOTE — Telephone Encounter (Signed)
Patient called needing Rx refilled Pregabalin. The number to contact patient is [phone]

## 2023-04-15 NOTE — Telephone Encounter (Signed)
Placed in mail for pt

## 2023-04-18 ENCOUNTER — Encounter: Payer: Self-pay | Admitting: Physical Medicine & Rehabilitation

## 2023-04-18 ENCOUNTER — Encounter: Payer: Medicare Other | Attending: Physical Medicine & Rehabilitation | Admitting: Physical Medicine & Rehabilitation

## 2023-04-18 VITALS — BP 128/70 | HR 73 | Ht 67.0 in | Wt 187.0 lb

## 2023-04-18 DIAGNOSIS — M79651 Pain in right thigh: Secondary | ICD-10-CM | POA: Diagnosis not present

## 2023-04-18 NOTE — Progress Notes (Signed)
Pt referred for EMG/NCV of BLEs please refer to scanned report under media tab.

## 2023-04-24 ENCOUNTER — Telehealth: Payer: Self-pay | Admitting: Orthopedic Surgery

## 2023-04-24 NOTE — Telephone Encounter (Signed)
Patient called in about the results for a test that was taken.CB#[phone]

## 2023-04-25 ENCOUNTER — Telehealth: Payer: Self-pay | Admitting: Orthopedic Surgery

## 2023-04-25 NOTE — Telephone Encounter (Signed)
Patient called. Would like test results.

## 2023-04-25 NOTE — Telephone Encounter (Signed)
I called and advised that the results are not back yet

## 2023-04-25 NOTE — Telephone Encounter (Signed)
Patient called. Would like [name] to call her. [phone]

## 2023-04-27 ENCOUNTER — Other Ambulatory Visit: Payer: Self-pay | Admitting: Psychiatry

## 2023-04-27 DIAGNOSIS — F3171 Bipolar disorder, in partial remission, most recent episode hypomanic: Secondary | ICD-10-CM

## 2023-04-27 NOTE — Telephone Encounter (Signed)
Please call to schedule appt, was a no show last one.

## 2023-04-28 ENCOUNTER — Ambulatory Visit: Payer: Medicare Other | Admitting: Professional Counselor

## 2023-04-30 ENCOUNTER — Telehealth: Payer: Self-pay

## 2023-04-30 DIAGNOSIS — E785 Hyperlipidemia, unspecified: Secondary | ICD-10-CM | POA: Diagnosis not present

## 2023-04-30 DIAGNOSIS — I10 Essential (primary) hypertension: Secondary | ICD-10-CM | POA: Diagnosis not present

## 2023-04-30 DIAGNOSIS — F319 Bipolar disorder, unspecified: Secondary | ICD-10-CM | POA: Diagnosis not present

## 2023-04-30 DIAGNOSIS — G20A1 Parkinson's disease without dyskinesia, without mention of fluctuations: Secondary | ICD-10-CM | POA: Diagnosis not present

## 2023-04-30 DIAGNOSIS — E119 Type 2 diabetes mellitus without complications: Secondary | ICD-10-CM | POA: Diagnosis not present

## 2023-04-30 NOTE — Telephone Encounter (Signed)
See other message

## 2023-04-30 NOTE — Telephone Encounter (Signed)
Patient called and would like to know her EMG results.

## 2023-04-30 NOTE — Telephone Encounter (Signed)
I called and lmom advising her of Dr. Buel Ream Message

## 2023-05-01 ENCOUNTER — Telehealth: Payer: Self-pay | Admitting: Orthopedic Surgery

## 2023-05-01 DIAGNOSIS — M5416 Radiculopathy, lumbar region: Secondary | ICD-10-CM

## 2023-05-01 NOTE — Telephone Encounter (Signed)
I called and lmovm for her to call me back.

## 2023-05-01 NOTE — Telephone Encounter (Signed)
Pt called in stating someone from this office let her know the results were in unsure who she spoke with last message was from [place] yesterday stating she wanted a call with those results per Neysa Bonito last note we do not have them yet she would like [name] to call and let her know please advise

## 2023-05-02 ENCOUNTER — Telehealth: Payer: Self-pay | Admitting: Orthopedic Surgery

## 2023-05-02 MED ORDER — PREGABALIN 75 MG PO CAPS: 75.0000 mg | ORAL_CAPSULE | Freq: Three times a day (TID) | ORAL | 1 refills | Status: DC

## 2023-05-02 NOTE — Telephone Encounter (Signed)
Patient called asked for a call back  concerning results.  [phone]

## 2023-05-02 NOTE — Addendum Note (Signed)
Addended by: Willia Craze on: 05/02/2023 04:40 PM   Modules accepted: Orders

## 2023-05-02 NOTE — Telephone Encounter (Signed)
See other message

## 2023-05-02 NOTE — Telephone Encounter (Signed)
I called her back, and explained to her what was found on the EMG. She states that the "Gabapentin isn't doing spit" for her. She would like to try the lyrica. I did advised her that she CANNOT take the Gabapentin and Lyrica at the same time, she states that she understands this.  Also she would like to go back to PT and the [facility] [employer].

## 2023-05-05 ENCOUNTER — Telehealth: Payer: Self-pay | Admitting: Orthopedic Surgery

## 2023-05-05 NOTE — Telephone Encounter (Signed)
Kristin Gilmore from [place] called in requesting pt PT referral -for her back to be faxed over to [phone]

## 2023-05-06 ENCOUNTER — Ambulatory Visit: Payer: Medicare Other | Admitting: Professional Counselor

## 2023-05-07 ENCOUNTER — Other Ambulatory Visit: Payer: Self-pay | Admitting: Radiology

## 2023-05-07 DIAGNOSIS — M25551 Pain in right hip: Secondary | ICD-10-CM

## 2023-05-07 DIAGNOSIS — M5416 Radiculopathy, lumbar region: Secondary | ICD-10-CM

## 2023-05-07 NOTE — Telephone Encounter (Signed)
Order placed

## 2023-05-08 ENCOUNTER — Telehealth: Payer: Self-pay | Admitting: Orthopedic Surgery

## 2023-05-08 NOTE — Telephone Encounter (Signed)
Patient request call back from [place], states she is in extreme pain from her right knee

## 2023-05-09 NOTE — Telephone Encounter (Signed)
I did call and speak with patient yesterday. I advised that Dr. Christell Constant will see her 1 time for her right knee pain and he will then refer her to someone else for it. She would like an afternoon appointment. **Holding message to look for PM appt**

## 2023-05-12 ENCOUNTER — Ambulatory Visit (INDEPENDENT_AMBULATORY_CARE_PROVIDER_SITE_OTHER): Payer: Medicare Other | Admitting: Orthopedic Surgery

## 2023-05-12 ENCOUNTER — Ambulatory Visit (INDEPENDENT_AMBULATORY_CARE_PROVIDER_SITE_OTHER): Payer: Medicare Other

## 2023-05-12 ENCOUNTER — Ambulatory Visit: Payer: Medicare Other | Admitting: Professional Counselor

## 2023-05-12 ENCOUNTER — Other Ambulatory Visit: Payer: Self-pay

## 2023-05-12 DIAGNOSIS — M25551 Pain in right hip: Secondary | ICD-10-CM

## 2023-05-12 DIAGNOSIS — G8929 Other chronic pain: Secondary | ICD-10-CM | POA: Diagnosis not present

## 2023-05-12 DIAGNOSIS — M25561 Pain in right knee: Secondary | ICD-10-CM | POA: Diagnosis not present

## 2023-05-12 NOTE — Progress Notes (Signed)
Orthopedic Spine Surgery Office Note   Assessment: Patient is a 72 y.o. female with chronic right sided radicular pain from the right hop along the lateral thigh. 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 I told her that she should not be taking gabapentin while using this medication. She should take 75mg  of lyrica twice per day. If her pain is not better, she can increase it to 3 times per day after 1 week -Recommended diagnostic/therapeutic right SI joint injection with Dr. 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    ___________________________________________________________________________   History: Patient is a 72 y.o. female who has been previously seen in the office for symptoms for low back pain that radiates into her right lateral thigh. It does not radiate past her knee. No pain on the left lower extremity. She has not noticed any recent changes in her right leg pain since I last saw her. Her bigger problem today is knee pain. She underwent right TKA with Dr. Roda Shutters in 05/2021. She said she has had pain since the knee was done. She has had work up with him for infection and bone scan. She does have a chronic lateral aspect of the patella fracture, which she has had for over 1 year now. Dr. Roda Shutters has worked her up including getting  a bone scan but did not find anything that he recommended further operative intervention. She said for the last week she has had worsening of her chronic right knee pain. There was no recent trauma or injury. She has not had any recent infections including UTI or PNA. She has not noticed any redness or drainage around the knee.    Previous treatments: PT, activity modification, over-the-counter medications, steroid injections, pain management   Physical Exam:   General: no acute distress, appears stated age Neurologic: alert, answering questions appropriately, following commands Respiratory: unlabored breathing on room air, symmetric chest rise Psychiatric: appropriate affect, normal cadence to speech     MSK (spine):   -Strength exam                                                   Left                  Right EHL                              5/5                  5/5 TA  5/5                  5/5 GSC                             5/5                  5/5 Knee extension            5/5                  5/5 Hip flexion                    5/5                  5/5   -Sensory exam                           Sensation intact to light touch in L3-S1 nerve distributions of bilateral lower extremities   -Straight leg raise: Negative bilaterally -Femoral nerve stretch test: Negative bilaterally -Clonus: no beats bilaterally  Right knee exam: midline incision appears well healed, there is no erythema/induration/drainage around the incision, has painless passive range of motion from 5-95 degrees, knee stable to varus/valgus stress, EHL/TA/GSC intact, SILT in s/s/dp/sp/t nerve distributions   Imaging: XR of the right hip from 05/12/2023 was independently reviewed and interpreted, showing valgus alignment.  Minimal joint space narrowing and subchondral sclerosis. No other significant degenerative changes seen. No fracture or dislocation seen.    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 There is no lucency seen around the components. There is a lateral patella fracture but the patella component appears in place. This fracture was seen on knee films from 03/2022. No new fractures seen. No dislocation seen.    XR of the lumbar spine from 07/15/2022 was previously independently reviewed and interpreted, showing interbody devices at L3/4 and L4/5 and pedicle screws at L3, L4, L5. There is no lucency seen around the screws or interbody devices. Lumbar scoliosis with apex at L2 - measures 28 degrees. Disc height loss at L2/3 and L5/S1.    CT of the lumbar spine from 04/22/2022 was previously independently reviewed and interpreted, showing vacuum disc at L5/S1 and L2/3. On the coronal, it appears the inferior facet of L2 is not present and there is translation at the level of the facet joint. Subtle lucency around the L3 screws. More obvious lucency around the L5 screws.    MRI of the lumbar spine from 05/30/2022 was previously independently reviewed and interpreted, showing L2/3 lateral recess and central stenosis. Foraminal stenosis at L4/5 and L5/S1. Central disc herniation at L5/S1.      Patient name: Kristin Gilmore Patient MRN: [id number] Date of visit: 05/12/23

## 2023-05-13 ENCOUNTER — Telehealth: Payer: Self-pay | Admitting: Orthopedic Surgery

## 2023-05-13 NOTE — Telephone Encounter (Signed)
Patient called and wanted to let you know that the lyrica she taking she has to take it differently.CB#[phone]

## 2023-05-13 NOTE — Telephone Encounter (Signed)
Patient came in 05/12/23

## 2023-05-14 ENCOUNTER — Telehealth: Payer: Self-pay | Admitting: Orthopedic Surgery

## 2023-05-14 NOTE — Telephone Encounter (Signed)
Patient called and had questions about her medications that she is taking.CB#[phone]

## 2023-05-14 NOTE — Telephone Encounter (Signed)
Patient called. Would like [name] to call her back.

## 2023-05-14 NOTE — Telephone Encounter (Signed)
I tried to call, but her VM is full, will try again later

## 2023-05-14 NOTE — Telephone Encounter (Signed)
I tried to call, but her VM is full, will try again later------see other message

## 2023-05-15 ENCOUNTER — Ambulatory Visit: Payer: Medicare Other | Admitting: Physical Medicine & Rehabilitation

## 2023-05-15 NOTE — Telephone Encounter (Signed)
I tried to call her back again but there was no answer and her VM is full. I will try again later

## 2023-05-15 NOTE — Telephone Encounter (Signed)
I tried to call her back but there was no answer and her VM is full. I will try again later.---see other message

## 2023-05-16 ENCOUNTER — Ambulatory Visit: Payer: Medicare Other | Admitting: Orthopedic Surgery

## 2023-05-16 NOTE — Telephone Encounter (Signed)
I can't reach the patient.

## 2023-05-19 ENCOUNTER — Ambulatory Visit: Payer: Medicare Other | Admitting: Orthopedic Surgery

## 2023-05-19 DIAGNOSIS — M4800 Spinal stenosis, site unspecified: Secondary | ICD-10-CM | POA: Diagnosis not present

## 2023-05-19 DIAGNOSIS — G894 Chronic pain syndrome: Secondary | ICD-10-CM | POA: Diagnosis not present

## 2023-05-19 DIAGNOSIS — Z6830 Body mass index (BMI) 30.0-30.9, adult: Secondary | ICD-10-CM | POA: Diagnosis not present

## 2023-05-19 DIAGNOSIS — M545 Low back pain, unspecified: Secondary | ICD-10-CM | POA: Diagnosis not present

## 2023-05-19 DIAGNOSIS — R030 Elevated blood-pressure reading, without diagnosis of hypertension: Secondary | ICD-10-CM | POA: Diagnosis not present

## 2023-05-19 DIAGNOSIS — M5430 Sciatica, unspecified side: Secondary | ICD-10-CM | POA: Diagnosis not present

## 2023-05-20 ENCOUNTER — Telehealth: Payer: Self-pay | Admitting: Orthopedic Surgery

## 2023-05-20 ENCOUNTER — Ambulatory Visit: Payer: Medicare Other | Admitting: Sports Medicine

## 2023-05-20 ENCOUNTER — Ambulatory Visit: Payer: Medicare Other | Admitting: Podiatry

## 2023-05-20 NOTE — Telephone Encounter (Signed)
Pt called requesting Dr. Christell Constant send a referral to [facility] for Dr Durene Romans for right knee. Pt asked if Neysa Bonito can call her when sent. Pt states she had knee replacement with Dr Roda Shutters couple years ago and dont wish to see him again. Please send referral and call pt when sent. Pt phone number is [phone].

## 2023-05-20 NOTE — Telephone Encounter (Signed)
Patient called and said she needs a referral. CB#[phone]

## 2023-05-21 ENCOUNTER — Other Ambulatory Visit: Payer: Self-pay | Admitting: Radiology

## 2023-05-21 DIAGNOSIS — G8929 Other chronic pain: Secondary | ICD-10-CM

## 2023-05-21 NOTE — Telephone Encounter (Signed)
I tried to call her to let her know that the referral was made. No answer and her VM is full.

## 2023-05-21 NOTE — Telephone Encounter (Signed)
Tried to call and have been unable to leave a message due to VM is full

## 2023-05-21 NOTE — Telephone Encounter (Signed)
See other message

## 2023-05-22 ENCOUNTER — Encounter: Payer: Self-pay | Admitting: Sports Medicine

## 2023-05-22 ENCOUNTER — Other Ambulatory Visit: Payer: Self-pay

## 2023-05-22 ENCOUNTER — Ambulatory Visit (INDEPENDENT_AMBULATORY_CARE_PROVIDER_SITE_OTHER): Payer: Medicare Other | Admitting: Sports Medicine

## 2023-05-22 DIAGNOSIS — G8929 Other chronic pain: Secondary | ICD-10-CM

## 2023-05-22 DIAGNOSIS — M533 Sacrococcygeal disorders, not elsewhere classified: Secondary | ICD-10-CM

## 2023-05-22 DIAGNOSIS — L821 Other seborrheic keratosis: Secondary | ICD-10-CM | POA: Insufficient documentation

## 2023-05-22 DIAGNOSIS — M793 Panniculitis, unspecified: Secondary | ICD-10-CM | POA: Insufficient documentation

## 2023-05-22 NOTE — Progress Notes (Signed)
   Procedure Note  Patient: Kristin Gilmore             Date of Birth: 1951-07-15           MRN: [id number]             Visit Date: 05/22/2023  Procedures: Visit Diagnoses:  1. Chronic right SI joint pain    U/S-guided SI-joint injection, right   After discussion of risk/benefits/indications, informed verbal consent was obtained. A timeout was then performed. The patient was positioned in a prone position on exam room table with a pillow placed under the pelvis for mild hip flexion. The SI joint area was cleaned and prepped with betadine and alcohol swabs. Sterile ultrasound gel was applied and the ultrasound transducer was placed in an anatomic axial plane over the PSIS, then moved distally over the SI-joint. Using ultrasound guidance, a 22-gauge, 3.5" needle was inserted from a medial to lateral approach utilizing an in-plane approach and directed into the SI-joint. The SI-joint was then injected with a mixture of 4:2 lidocaine:depomedrol with visualization of the injectate flow into the SI-joint under ultrasound visualization. The patient tolerated the procedure well without immediate complications.  Lab Results  Component Value Date   HGBA1C 5.8 (H) 06/22/2021   - I evaluated the patient about 5 minutes post-injection and she was doing well without AE's - follow-up with Dr. Christell Constant as indicated; I am happy to see them as needed  Madelyn Brunner, DO Primary Care Sports Medicine Physician  [facility] - Orthopedics  This note was dictated using Dragon naturally speaking software and may contain errors in syntax, spelling, or content which have not been identified prior to signing this note.

## 2023-05-27 DIAGNOSIS — K080 Exfoliation of teeth due to systemic causes: Secondary | ICD-10-CM | POA: Diagnosis not present

## 2023-05-30 DIAGNOSIS — S82091A Other fracture of right patella, initial encounter for closed fracture: Secondary | ICD-10-CM | POA: Diagnosis not present

## 2023-05-30 DIAGNOSIS — Z96651 Presence of right artificial knee joint: Secondary | ICD-10-CM | POA: Diagnosis not present

## 2023-06-06 NOTE — Progress Notes (Signed)
Sent message, via epic in basket, requesting orders in epic from surgeon.  

## 2023-06-09 ENCOUNTER — Encounter: Payer: Self-pay | Admitting: Podiatry

## 2023-06-09 ENCOUNTER — Ambulatory Visit: Payer: Medicare Other | Admitting: Podiatry

## 2023-06-09 DIAGNOSIS — B351 Tinea unguium: Secondary | ICD-10-CM | POA: Diagnosis not present

## 2023-06-09 DIAGNOSIS — L309 Dermatitis, unspecified: Secondary | ICD-10-CM | POA: Insufficient documentation

## 2023-06-09 DIAGNOSIS — M79674 Pain in right toe(s): Secondary | ICD-10-CM

## 2023-06-09 DIAGNOSIS — M79675 Pain in left toe(s): Secondary | ICD-10-CM

## 2023-06-09 DIAGNOSIS — E1141 Type 2 diabetes mellitus with diabetic mononeuropathy: Secondary | ICD-10-CM

## 2023-06-09 NOTE — Progress Notes (Signed)
This patient returns to my office for at risk foot care.  This patient requires this care by a professional since this patient will be at risk due to having diabetes and Parkinsons. This patient is unable to cut nails herself since the patient cannot reach hernails.These nails are painful walking and wearing shoes.  This patient presents for at risk foot care today.  General Appearance  Alert, conversant and in no acute stress.  Vascular  Dorsalis pedis and posterior tibial  pulses are palpable  bilaterally.  Capillary return is within normal limits  bilaterally. Temperature is within normal limits  bilaterally.  Neurologic  Senn-Weinstein monofilament wire test within normal limits  bilaterally. Muscle power within normal limits bilaterally.  Nails Thick disfigured discolored nails with subungual debris  from hallux to fifth toes bilaterally. No evidence of bacterial infection or drainage bilaterally.  Orthopedic  No limitations of motion  feet .  No crepitus or effusions noted.  No bony pathology or digital deformities noted.  Skin  normotropic skin with no porokeratosis noted bilaterally.  No signs of infections or ulcers noted.   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  Onychomycosis  Pain in right toes  Pain in left toes  Dermatitis.  Consent was obtained for treatment procedures.   Mechanical debridement of nails 1-5  bilaterally performed with a nail nipper.  Filed with dremel without incident. Discussed her dermatitis  and told her to try cortaid.   Return office visit    10 weeks.                 Told patient to return for periodic foot care and evaluation due to potential at risk complications.   Helane Gunther DPM

## 2023-06-10 DIAGNOSIS — K080 Exfoliation of teeth due to systemic causes: Secondary | ICD-10-CM | POA: Diagnosis not present

## 2023-06-11 ENCOUNTER — Telehealth: Payer: Self-pay | Admitting: Orthopedic Surgery

## 2023-06-11 DIAGNOSIS — Z23 Encounter for immunization: Secondary | ICD-10-CM | POA: Diagnosis not present

## 2023-06-11 DIAGNOSIS — I10 Essential (primary) hypertension: Secondary | ICD-10-CM | POA: Diagnosis not present

## 2023-06-11 DIAGNOSIS — Z01818 Encounter for other preprocedural examination: Secondary | ICD-10-CM | POA: Diagnosis not present

## 2023-06-11 MED ORDER — PREGABALIN 75 MG PO CAPS: 75.0000 mg | ORAL_CAPSULE | Freq: Three times a day (TID) | ORAL | 1 refills | Status: DC

## 2023-06-11 NOTE — Addendum Note (Signed)
Addended by: Willia Craze on: 06/11/2023 01:14 PM   Modules accepted: Orders

## 2023-06-11 NOTE — Progress Notes (Signed)
Second request for preop orders in [facility]: Left message for [facility]

## 2023-06-11 NOTE — Telephone Encounter (Signed)
Patient called and needs a refill on Pregabalin 75mg . CB#[phone]

## 2023-06-12 NOTE — Patient Instructions (Addendum)
SURGICAL WAITING ROOM VISITATION Patients having surgery or a procedure may have no more than 2 support people in the waiting area - these visitors may rotate.    Children under the age of 46 must have an adult with them who is not the patient.  If the patient needs to stay at the hospital during part of their recovery, the visitor guidelines for inpatient rooms apply. Pre-op nurse will coordinate an appropriate time for 1 support person to accompany patient in pre-op.  This support person may not rotate.    Please refer to the [facility] website for the visitor guidelines for Inpatients (after your surgery is over and you are in a regular room).       Your procedure is scheduled on: 06-19-23   Report to [facility] Main Entrance    Report to admitting at 5:15 AM   Call this number if you have problems the morning of surgery [phone]   Do not eat food :After Midnight.   After Midnight you may have the following liquids until 4:20 AM DAY OF SURGERY  Water Non-Citrus Juices (without pulp, NO RED-Apple, White grape, White cranberry) Black Coffee (NO MILK/CREAM OR CREAMERS, sugar ok)  Clear Tea (NO MILK/CREAM OR CREAMERS, sugar ok) regular and decaf                             Plain Jell-O (NO RED)                                           Fruit ices (not with fruit pulp, NO RED)                                     Popsicles (NO RED)                                                               Sports drinks like Gatorade (NO RED)                   The day of surgery:  Drink ONE (1) Pre-Surgery G2 by 4:20 AM the morning of surgery. Drink in one sitting. Do not sip.  This drink was given to you during your hospital  pre-op appointment visit. Nothing else to drink after completing the Pre-Surgery G2.          If you have questions, please contact your surgeon's office.   FOLLOW  ANY ADDITIONAL PRE OP INSTRUCTIONS YOU RECEIVED FROM YOUR SURGEON'S OFFICE!!!      Oral Hygiene is also important to reduce your risk of infection.                                    Remember - BRUSH YOUR TEETH THE MORNING OF SURGERY WITH YOUR REGULAR TOOTHPASTE   Do NOT smoke after Midnight   Take these medicines the morning of surgery with A SIP OF WATER:   Amlodipine  Amoxicillin  Carbidopa-Levodopa  Carvedilol  Divalproex  Hyoscyamine  Pregabalin  Tylenol if needed  Stop all vitamins and herbal supplements 7 days before surgery                              You may not have any metal on your body including hair pins, jewelry, and body piercing             Do not wear make-up, lotions, powders, perfumes or deodorant  Do not wear nail polish including gel and S&S, artificial/acrylic nails, or any other type of covering on natural nails including finger and toenails. If you have artificial nails, gel coating, etc. that needs to be removed by a nail salon please have this removed prior to surgery or surgery may need to be canceled/ delayed if the surgeon/ anesthesia feels like they are unable to be safely monitored.   Do not shave  48 hours prior to surgery.    Do not bring valuables to the hospital. [name] IS NOT RESPONSIBLE   FOR VALUABLES.   Contacts, dentures or bridgework may not be worn into surgery.   Bring small overnight bag day of surgery.   DO NOT BRING YOUR HOME MEDICATIONS TO THE HOSPITAL. PHARMACY WILL DISPENSE MEDICATIONS LISTED ON YOUR MEDICATION LIST TO YOU DURING YOUR ADMISSION IN THE HOSPITAL!   Special Instructions: Bring a copy of your healthcare power of attorney and living will documents the day of surgery if you haven't scanned them before.              Please read over the following fact sheets you were given: IF YOU HAVE QUESTIONS ABOUT YOUR PRE-OP INSTRUCTIONS PLEASE CALL [phone] [name]  If you received a COVID test during your pre-op visit  it is requested that you wear a mask when out in public, stay away from anyone that  may not be feeling well and notify your surgeon if you develop symptoms. If you test positive for Covid or have been in contact with anyone that has tested positive in the last 10 days please notify you surgeon.  [place] - Preparing for Surgery Before surgery, you can play an important role.  Because skin is not sterile, your skin needs to be as free of germs as possible.  You can reduce the number of germs on your skin by washing with CHG (chlorahexidine gluconate) soap before surgery.  CHG is an antiseptic cleaner which kills germs and bonds with the skin to continue killing germs even after washing. Please DO NOT use if you have an allergy to CHG or antibacterial soaps.  If your skin becomes reddened/irritated stop using the CHG and inform your nurse when you arrive at Short Stay. Do not shave (including legs and underarms) for at least 48 hours prior to the first CHG shower.  You may shave your face/neck.  Please follow these instructions carefully:  1.  Shower with CHG Soap the night before surgery and the  morning of surgery.  2.  If you choose to wash your hair, wash your hair first as usual with your normal  shampoo.  3.  After you shampoo, rinse your hair and body thoroughly to remove the shampoo.                             4.  Use CHG as you would any other liquid soap.  You can apply  chg directly to the skin and wash.  Gently with a scrungie or clean washcloth.  5.  Apply the CHG Soap to your body ONLY FROM THE NECK DOWN.   Do   not use on face/ open                           Wound or open sores. Avoid contact with eyes, ears mouth and   genitals (private parts).                       Wash face,  Genitals (private parts) with your normal soap.             6.  Wash thoroughly, paying special attention to the area where your    surgery  will be performed.  7.  Thoroughly rinse your body with warm water from the neck down.  8.  DO NOT shower/wash with your normal soap after using and  rinsing off the CHG Soap.                9.  Pat yourself dry with a clean towel.            10.  Wear clean pajamas.            11.  Place clean sheets on your bed the night of your first shower and do not  sleep with pets. Day of Surgery : Do not apply any lotions/deodorants the morning of surgery.  Please wear clean clothes to the hospital/surgery center.  FAILURE TO FOLLOW THESE INSTRUCTIONS MAY RESULT IN THE CANCELLATION OF YOUR SURGERY  PATIENT SIGNATURE_________________________________  NURSE SIGNATURE__________________________________  ________________________________________________________________________    Kristin Gilmore  An incentive spirometer is a tool that can help keep your lungs clear and active. This tool measures how well you are filling your lungs with each breath. Taking long deep breaths may help reverse or decrease the chance of developing breathing (pulmonary) problems (especially infection) following: A long period of time when you are unable to move or be active. BEFORE THE PROCEDURE  If the spirometer includes an indicator to show your best effort, your nurse or respiratory therapist will set it to a desired goal. If possible, sit up straight or lean slightly forward. Try not to slouch. Hold the incentive spirometer in an upright position. INSTRUCTIONS FOR USE  Sit on the edge of your bed if possible, or sit up as far as you can in bed or on a chair. Hold the incentive spirometer in an upright position. Breathe out normally. Place the mouthpiece in your mouth and seal your lips tightly around it. Breathe in slowly and as deeply as possible, raising the piston or the ball toward the top of the column. Hold your breath for 3-5 seconds or for as long as possible. Allow the piston or ball to fall to the bottom of the column. Remove the mouthpiece from your mouth and breathe out normally. Rest for a few seconds and repeat Steps 1 through 7 at least 10  times every 1-2 hours when you are awake. Take your time and take a few normal breaths between deep breaths. The spirometer may include an indicator to show your best effort. Use the indicator as a goal to work toward during each repetition. After each set of 10 deep breaths, practice coughing to be sure your lungs are clear. If you have an incision (the cut made at the  time of surgery), support your incision when coughing by placing a pillow or rolled up towels firmly against it. Once you are able to get out of bed, walk around indoors and cough well. You may stop using the incentive spirometer when instructed by your caregiver.  RISKS AND COMPLICATIONS Take your time so you do not get dizzy or light-headed. If you are in pain, you may need to take or ask for pain medication before doing incentive spirometry. It is harder to take a deep breath if you are having pain. AFTER USE Rest and breathe slowly and easily. It can be helpful to keep track of a log of your progress. Your caregiver can provide you with a simple table to help with this. If you are using the spirometer at home, follow these instructions: SEEK MEDICAL CARE IF:  You are having difficultly using the spirometer. You have trouble using the spirometer as often as instructed. Your pain medication is not giving enough relief while using the spirometer. You develop fever of 100.5 F (38.1 C) or higher. SEEK IMMEDIATE MEDICAL CARE IF:  You cough up bloody sputum that had not been present before. You develop fever of 102 F (38.9 C) or greater. You develop worsening pain at or near the incision site. MAKE SURE YOU:  Understand these instructions. Will watch your condition. Will get help right away if you are not doing well or get worse. Document Released: 01/27/2007 Document Revised: 12/09/2011 Document Reviewed: 03/30/2007 [facility] Patient Information 2014 [place],  [place].   ________________________________________________________________________

## 2023-06-12 NOTE — Progress Notes (Addendum)
COVID Vaccine Completed:  Date of COVID positive in last 90 days:  No  PCP - Jackelyn Poling, DO Cardiologist - Previously saw Loman Brooklyn, MD  Chest x-ray - N/A EKG - 06-13-23 Epic Stress Test - 04-10-17 Epic ECHO - 06-05-22 Epic Cardiac Cath - N/A Pacemaker/ICD device last checked: Spinal Cord Stimulator:  N/A  Bowel Prep - N/A  Sleep Study - N/A CPAP -   Fasting Blood Sugar - N/A Checks Blood Sugar _____ times a day  Last dose of GLP1 agonist-  N/A GLP1 instructions:  N/A   Last dose of SGLT-2 inhibitors-  N/A SGLT-2 instructions: N/A  Blood Thinner Instructions:  Time Aspirin Instructions:  ASA 81.  Per patient to stop 5 days before  Last Dose:  Activity level:  Difficulty climbing stairs due to severe knee pain.  Able to perform activities of daily living without stopping and without symptoms of chest pain or shortness of breath.  Anesthesia review:  Poor R wave progression on EKG  Parkinson's disease with severe hand tremors.  Chest pain evaluated by cardiology, no recent episodes of chest pain.  Patient states that they are doing further testing because unsure if tremor is Parkinsons  Patient denies shortness of breath, fever, cough and chest pain at PAT appointment  Patient verbalized understanding of instructions that were given to them at the PAT appointment. Patient was also instructed that they will need to review over the PAT instructions again at home before surgery.

## 2023-06-13 ENCOUNTER — Encounter (HOSPITAL_COMMUNITY)
Admission: RE | Admit: 2023-06-13 | Discharge: 2023-06-13 | Disposition: A | Discharge status: Home / Self Care | Payer: Medicare Other | Source: Ambulatory Visit | Attending: Orthopedic Surgery | Admitting: Orthopedic Surgery

## 2023-06-13 ENCOUNTER — Other Ambulatory Visit: Payer: Self-pay

## 2023-06-13 ENCOUNTER — Encounter (HOSPITAL_COMMUNITY): Payer: Self-pay

## 2023-06-13 VITALS — BP 118/91 | HR 71 | Temp 99.0°F | Resp 16 | Ht 67.0 in | Wt 190.6 lb

## 2023-06-13 DIAGNOSIS — I10 Essential (primary) hypertension: Secondary | ICD-10-CM | POA: Diagnosis not present

## 2023-06-13 DIAGNOSIS — Z01818 Encounter for other preprocedural examination: Secondary | ICD-10-CM | POA: Insufficient documentation

## 2023-06-13 DIAGNOSIS — Z79899 Other long term (current) drug therapy: Secondary | ICD-10-CM | POA: Diagnosis not present

## 2023-06-13 HISTORY — DX: Anxiety disorder, unspecified: F41.9

## 2023-06-13 LAB — BASIC METABOLIC PANEL
Anion gap: 10 (ref 5–15)
BUN: 37 mg/dL — ABNORMAL HIGH (ref 8–23)
CO2: 28 mmol/L (ref 22–32)
Calcium: 8.8 mg/dL — ABNORMAL LOW (ref 8.9–10.3)
Chloride: 100 mmol/L (ref 98–111)
Creatinine, Ser: 1.15 mg/dL — ABNORMAL HIGH (ref 0.44–1.00)
GFR, Estimated: 50 mL/min — ABNORMAL LOW (ref >60)
Glucose, Bld: 119 mg/dL — ABNORMAL HIGH (ref 70–99)
Potassium: 4.7 mmol/L (ref 3.5–5.1)
Sodium: 138 mmol/L (ref 135–145)

## 2023-06-16 ENCOUNTER — Emergency Department (HOSPITAL_COMMUNITY)
Admission: EM | Admit: 2023-06-16 | Discharge: 2023-06-16 | Disposition: A | Discharge status: Home / Self Care | Payer: Medicare Other | Attending: Emergency Medicine | Admitting: Emergency Medicine

## 2023-06-16 ENCOUNTER — Emergency Department (HOSPITAL_COMMUNITY): Payer: Medicare Other

## 2023-06-16 ENCOUNTER — Encounter (HOSPITAL_COMMUNITY): Payer: Self-pay | Admitting: Physician Assistant

## 2023-06-16 ENCOUNTER — Other Ambulatory Visit: Payer: Self-pay

## 2023-06-16 DIAGNOSIS — K080 Exfoliation of teeth due to systemic causes: Secondary | ICD-10-CM | POA: Diagnosis not present

## 2023-06-16 DIAGNOSIS — Z7982 Long term (current) use of aspirin: Secondary | ICD-10-CM | POA: Diagnosis not present

## 2023-06-16 DIAGNOSIS — R0789 Other chest pain: Secondary | ICD-10-CM

## 2023-06-16 DIAGNOSIS — Z79899 Other long term (current) drug therapy: Secondary | ICD-10-CM | POA: Diagnosis not present

## 2023-06-16 DIAGNOSIS — I10 Essential (primary) hypertension: Secondary | ICD-10-CM | POA: Insufficient documentation

## 2023-06-16 DIAGNOSIS — R58 Hemorrhage, not elsewhere classified: Secondary | ICD-10-CM | POA: Diagnosis not present

## 2023-06-16 DIAGNOSIS — R079 Chest pain, unspecified: Secondary | ICD-10-CM | POA: Diagnosis not present

## 2023-06-16 DIAGNOSIS — E119 Type 2 diabetes mellitus without complications: Secondary | ICD-10-CM | POA: Diagnosis not present

## 2023-06-16 DIAGNOSIS — K0889 Other specified disorders of teeth and supporting structures: Secondary | ICD-10-CM | POA: Diagnosis not present

## 2023-06-16 DIAGNOSIS — R0902 Hypoxemia: Secondary | ICD-10-CM | POA: Diagnosis not present

## 2023-06-16 LAB — COMPREHENSIVE METABOLIC PANEL
ALT: 9 U/L (ref 0–44)
AST: 20 U/L (ref 15–41)
Albumin: 4.1 g/dL (ref 3.5–5.0)
Alkaline Phosphatase: 80 U/L (ref 38–126)
Anion gap: 14 (ref 5–15)
BUN: 28 mg/dL — ABNORMAL HIGH (ref 8–23)
CO2: 24 mmol/L (ref 22–32)
Calcium: 9.3 mg/dL (ref 8.9–10.3)
Chloride: 101 mmol/L (ref 98–111)
Creatinine, Ser: 1.06 mg/dL — ABNORMAL HIGH (ref 0.44–1.00)
GFR, Estimated: 55 mL/min — ABNORMAL LOW (ref >60)
Glucose, Bld: 105 mg/dL — ABNORMAL HIGH (ref 70–99)
Potassium: 4.3 mmol/L (ref 3.5–5.1)
Sodium: 139 mmol/L (ref 135–145)
Total Bilirubin: 0.6 mg/dL (ref 0.3–1.2)
Total Protein: 7 g/dL (ref 6.5–8.1)

## 2023-06-16 LAB — CBC WITH DIFFERENTIAL/PLATELET
Abs Immature Granulocytes: 0.02 10*3/uL (ref 0.00–0.07)
Basophils Absolute: 0 10*3/uL (ref 0.0–0.1)
Basophils Relative: 1 %
Eosinophils Absolute: 0.1 10*3/uL (ref 0.0–0.5)
Eosinophils Relative: 2 %
HCT: 41.4 % (ref 36.0–46.0)
Hemoglobin: 13 g/dL (ref 12.0–15.0)
Immature Granulocytes: 0 %
Lymphocytes Relative: 30 %
Lymphs Abs: 2 10*3/uL (ref 0.7–4.0)
MCH: 31 pg (ref 26.0–34.0)
MCHC: 31.4 g/dL (ref 30.0–36.0)
MCV: 98.6 fL (ref 80.0–100.0)
Monocytes Absolute: 0.6 10*3/uL (ref 0.1–1.0)
Monocytes Relative: 9 %
Neutro Abs: 4 10*3/uL (ref 1.7–7.7)
Neutrophils Relative %: 58 %
Platelets: 195 10*3/uL (ref 150–400)
RBC: 4.2 MIL/uL (ref 3.87–5.11)
RDW: 13.4 % (ref 11.5–15.5)
WBC: 6.9 10*3/uL (ref 4.0–10.5)
nRBC: 0 % (ref 0.0–0.2)

## 2023-06-16 LAB — TROPONIN I (HIGH SENSITIVITY)
Troponin I (High Sensitivity): 6 ng/L (ref <18)
Troponin I (High Sensitivity): 6 ng/L (ref <18)

## 2023-06-16 MED ORDER — HYDROCODONE-ACETAMINOPHEN 5-325 MG PO TABS
1.0000 | ORAL_TABLET | Freq: Once | ORAL | Status: AC
  Administered 2023-06-16: 1 via ORAL
  Filled 2023-06-16: qty 1

## 2023-06-16 NOTE — Discharge Instructions (Addendum)
You are seen in the emergency department today for chest pain.  Your cardiac workup was thankfully unremarkable with no acute findings noted to explain her pain.  I spoke with a cardiologist regarding her care and they advised that you are safe for outpatient follow-up.  Please reach out to the cardiology group if you do not hear from the next 24 to 36 hours.  If symptoms worsen or return, please return the emergency department.

## 2023-06-16 NOTE — ED Provider Triage Note (Signed)
Emergency Medicine Provider Triage Evaluation Note  Kristin Gilmore , a 72 y.o. female  was evaluated in triage.  Pt complains of chest pain.  Patient reports that she was at the dental office earlier today having teeth removed while in the recovery room she began to experience chest pain.  Reports the pain was central nature without radiation to the neck.  Denies any nausea, vomiting, or diaphoresis when the chest pain began.  Patient is poorly given 2 doses of nitroglycerin at the dental office and symptoms have since improved/resolved.  No prior cardiac history the patient to report to me the patient does have a history of hypertension, hyperlipidemia, and type 2 diabetes.  Review of Systems  Positive: As above Negative: As above  Physical Exam  BP 131/78 (BP Location: Right Arm)   Pulse 86   Temp 98.3 F (36.8 C) (Oral)   Resp 18   Ht 5\' 7"  (1.702 m)   Wt 86 kg   SpO2 98%   BMI 29.69 kg/m  Gen:   Awake, no distress   Resp:  Normal effort  MSK:   Moves extremities without difficulty  Other:    Medical Decision Making  Medically screening exam initiated at 12:43 PM.  Appropriate orders placed.  Vesenia Pearman Mcwright was informed that the remainder of the evaluation will be completed by another provider, this initial triage assessment does not replace that evaluation, and the importance of remaining in the ED until their evaluation is complete.     Smitty Knudsen, PA-C 06/16/23 1243

## 2023-06-16 NOTE — ED Notes (Signed)
Tech attempted to snap EKG. Pt. Stated, "I do not want to mess my dress up".

## 2023-06-16 NOTE — ED Notes (Signed)
Lt green, blue, ST, Lav, dark green tubes sent to lab

## 2023-06-16 NOTE — ED Triage Notes (Signed)
Pt to the ED from a dentals office  with a CC of  Chest pain after having two teeth removed. Pt relays chest pain started shortly after dental office gave  2 nitros that improve CP. Pt relays sob. Denies dizziness,n/v/d, LOC

## 2023-06-16 NOTE — ED Provider Notes (Signed)
 [facility] AT [facility] Provider Note   CSN: [id number] Arrival date & time: 06/16/23  1129     History Chief Complaint  Patient presents with   Chest Pain    Kristin Gilmore is a 72 y.o. female.  Patient hypertension, bipolar disorder, type 2 diabetes who presents to the emergency department concerns of chest pain.  Reports that she was at the dental office earlier today after having 2 teeth removed.  Reports that while she was in the recovery room, she began experience ultralights chest pain without radiation.  Was given 2 doses of nitro which improved/relieved her chest pain.  No longer experiencing any chest pain or shortness of breath.  Denies any dizziness, nausea, vomiting, diarrhea or diaphoresis during episode of chest pain.   Chest Pain      Home Medications Prior to Admission medications   Medication Sig Start Date End Date Taking? Authorizing Provider  acetaminophen (TYLENOL) 500 MG tablet Take 1,000 mg by mouth every 6 (six) hours as needed for mild pain or moderate pain.    [provider]  amitriptyline (ELAVIL) 10 MG tablet Take 1 tablet (10 mg total) by mouth at bedtime as needed for sleep. Patient taking differently: Take 25 mg by mouth at bedtime. 06/08/22   Zigmund Daniel., MD  amLODipine (NORVASC) 5 MG tablet Take 5 mg by mouth 2 (two) times daily. 07/27/18   [provider]  amoxicillin (AMOXIL) 500 MG capsule Take 500 mg by mouth 3 (three) times daily.    [provider]  aspirin EC 81 MG tablet Take 81 mg by mouth daily. Swallow whole.    [provider]  bisacodyl (DULCOLAX) 5 MG EC tablet Take 5-15 mg by mouth at bedtime as needed (constipation).    [provider]  BLACK COHOSH PO Take 1 tablet by mouth daily.    [provider]  Calcium Citrate-Vitamin D (CALCIUM CITRATE + D PO) Take 1-2 capsules by mouth See admin instructions. Calcium 200 mg, vitamin D3 2.5  mcg - 100 units - take 1 tablet by mouth with breakfast and 2 tablets with supper    [provider]  carbidopa-levodopa (SINEMET IR) 25-100 MG tablet Take 1 tablet by mouth 4 (four) times daily. 12/25/22   [provider]  carvedilol (COREG) 12.5 MG tablet Take 12.5 mg by mouth 2 (two) times daily. 03/24/23   [provider]  Cholecalciferol (VITAMIN D3) 50 MCG (2000 UT) capsule Take 2,000 Units by mouth daily.    [provider]  Coenzyme Q10 (COQ10) 100 MG CAPS Take 100 mg by mouth daily with supper.    [provider]  diazepam (VALIUM) 5 MG tablet Take 1 tablet (5 mg total) by mouth once as needed for up to 1 dose (prior to nerve study). Patient not taking: Reported on 06/09/2023 03/18/23   London Sheer, MD  divalproex (DEPAKOTE ER) 250 MG 24 hr tablet Take 3 tablets (750 mg total) by mouth daily. Patient taking differently: Take 125-375 mg by mouth See admin instructions. Take 125 mg in morning and 375 mg at bedtime 02/05/23   Cottle, Steva Ready., MD  docusate sodium (COLACE) 100 MG capsule Take 200-300 mg by mouth at bedtime.    [provider]  Homeopathic Products [facility] RELIEF EX) Apply 1 application  topically daily as needed (Knee pain).    [provider]  hyoscyamine (ANASPAZ) 0.125 MG TBDP disintergrating tablet Take 0.125  mg by mouth daily as needed (Stomach pain). 03/06/23   [provider]  ibuprofen (ADVIL) 200 MG tablet Take 400 mg by mouth daily as needed for headache.    [provider]  loperamide (IMODIUM) 2 MG capsule Take 2 mg by mouth 4 (four) times daily as needed for diarrhea or loose stools.    [provider]  MAGNESIUM CITRATE PO Take 150 mg by mouth at bedtime.    [provider]  Magnesium Oxide 500 MG TABS Take 500 mg by mouth at bedtime.    [provider]  Menthol, Topical Analgesic, (BIOFREEZE) 4 % GEL Apply 1 application  topically 4 (four) times daily as  needed (pain).    [provider]  Omega-3 Fatty Acids (FISH OIL) 500 MG CAPS Take 500 mg by mouth 2 (two) times daily. Super    [provider]  OVER THE COUNTER MEDICATION Take 50 mg by mouth 2 (two) times daily. PETADOLEX - Health brain blood vessel relaxation (butterburr root extract)    [provider]  OVER THE COUNTER MEDICATION Apply 1 application  topically daily as needed (Pain). [place]    [provider]  pregabalin (LYRICA) 75 MG capsule Take 1 capsule (75 mg total) by mouth 3 (three) times daily. 06/11/23 08/10/23  London Sheer, MD  QUEtiapine (SEROQUEL) 300 MG tablet Take 0.5 tablets (150 mg total) by mouth at bedtime as needed. Patient taking differently: Take 75 mg by mouth at bedtime. 02/02/23   Cottle, Steva Ready., MD  Red Yeast Rice 600 MG CAPS Take 1,200 mg by mouth 2 (two) times daily with a meal.    [provider]  rosuvastatin (CRESTOR) 20 MG tablet Take 20 mg by mouth every Monday, Wednesday, and Friday. Dinner 05/01/23   [provider]  telmisartan (MICARDIS) 80 MG tablet Take 80 mg by mouth at bedtime.    [provider]  Turmeric 500 MG CAPS Take 500 mg by mouth 2 (two) times daily with a meal. extract    [provider]      Allergies    Levofloxacin, Amoxicillin, Clarithromycin, and Atropine    Review of Systems   Review of Systems  Cardiovascular:  Positive for chest pain.  All other systems reviewed and are negative.   Physical Exam Updated Vital Signs BP (!) 188/105   Pulse 88   Temp 98.3 F (36.8 C) (Oral)   Resp 18   Ht 5\' 7"  (1.702 m)   Wt 86 kg   SpO2 97%   BMI 29.69 kg/m  Physical Exam Vitals and nursing note reviewed.  Constitutional:      General: She is not in acute distress.    Appearance: She is well-developed.  HENT:     Head: Normocephalic and atraumatic.  Eyes:     Conjunctiva/sclera: Conjunctivae normal.  Cardiovascular:     Rate and Rhythm: Normal rate  and regular rhythm.     Heart sounds: No murmur heard. Pulmonary:     Effort: Pulmonary effort is normal. No respiratory distress.     Breath sounds: Normal breath sounds. No decreased breath sounds.  Abdominal:     Palpations: Abdomen is soft.     Tenderness: There is no abdominal tenderness.  Musculoskeletal:        General: No swelling.     Cervical back: Neck supple.  Skin:    General: Skin is warm and dry.     Capillary Refill: Capillary refill takes  less than 2 seconds.  Neurological:     Mental Status: She is alert.  Psychiatric:        Mood and Affect: Mood normal.     ED Results / Procedures / Treatments   Labs (all labs ordered are listed, but only abnormal results are displayed) Labs Reviewed  COMPREHENSIVE METABOLIC PANEL - Abnormal; Notable for the following components:      Result Value   Glucose, Bld 105 (*)    BUN 28 (*)    Creatinine, Ser 1.06 (*)    GFR, Estimated 55 (*)    All other components within normal limits  CBC WITH DIFFERENTIAL/PLATELET  TROPONIN I (HIGH SENSITIVITY)  TROPONIN I (HIGH SENSITIVITY)    EKG EKG Interpretation Date/Time:  Monday June 16 2023 12:35:13 EDT Ventricular Rate:  67 PR Interval:  184 QRS Duration:  86 QT Interval:  386 QTC Calculation: 407 R Axis:   39  Text Interpretation: Normal sinus rhythm Anterior infarct , age undetermined Abnormal ECG When compared with ECG of 13-Jun-2023 14:36, PREVIOUS ECG IS PRESENT Confirmed by Richardean Canal [id number]) on 06/17/2023 4:38:16 PM  Radiology No results found.  Procedures Procedures   Medications Ordered in ED Medications  HYDROcodone-acetaminophen (NORCO/VICODIN) 5-325 MG per tablet 1 tablet (1 tablet Oral Given 06/16/23 1445)    ED Course/ Medical Decision Making/ A&P                               Medical Decision Making Amount and/or Complexity of Data Reviewed Labs: ordered. Radiology: ordered.  Risk Prescription drug management.   This patient presents  to the ED for concern of chest pain. Differential diagnosis includes ACS, MI, pulmonary embolism, viral URI, pneumonia   Lab Tests:  I Ordered, and personally interpreted labs.  The pertinent results include: CBC unremarkable, CMP with mild decrease in renal function with creatinine at 1.06 and GFR 55, troponin 6 with delta troponin also 6   Imaging Studies ordered:  I ordered imaging studies including chest x-ray I independently visualized and interpreted imaging which showed no acute cardiopulmonary process I agree with the radiologist interpretation   Medicines ordered and prescription drug management:  I ordered medication including Norco for dental pain Reevaluation of the patient after these medicines showed that the patient improved I have reviewed the patients home medicines and have made adjustments as needed   Problem List / ED Course:  Patient presents to the emergency department concerns of chest pain.  Reports that she was at the dental office earlier today when she was having 2 teeth removed.  She reports that while she was at the recovery room after anesthesia, she began to experience centralized chest pain without radiation.  Was given 2 doses of nitroglycerin that she reports alleviated her symptoms.  Has not had any recurrence of this pain.  Workup with concern for possible cardiac etiology of her current symptoms.  Denies any chest pain or shortness of breath at this time.  Denies any fevers, nausea, vomiting, diaphoresis, abdominal pain, urinary concerns. Lab workup is large unremarkable for any acute abnormalities noted on CBC, CMP, or troponin.  Some level of renal impairment but this appears to be patient's baseline comparing prior labs.  Troponin was initially 6 with delta troponin at 6.  Chest x-ray is unremarkable and EKG shows normal sinus rhythm.  Given the patient has not had any recurrence in symptoms, will consult cardiology for recommendations  the patient is  safe for outpatient follow-up given that her heart score is currently a 4 placing her at moderate risk. Patient was given a dose of Norco for dental pain as she had 2 teeth removed earlier today.  She has not been able to take any of her prescribed Norco at this time so a dose of 5 mg Norco was given here in the emergency department.  Pain alleviated. Spoke with Dr. Cristal Deer, cardiologist, who advises outpatient follow-up given reassuring workup and flat troponin.  Will place order for referral to cardiology and informed patient of this plan. Patient feels reassured regarding current care plan and workup. No other acute concerns at this time. Discussed strict return precautions with patient. Patient discharged home in stable condition.  Final Clinical Impression(s) / ED Diagnoses Final diagnoses:  Atypical chest pain  Pain, dental    Rx / DC Orders ED Discharge Orders          Ordered    Ambulatory referral to Cardiology       Comments: If you have not heard from the Cardiology office within the next 72 hours please call [phone].   06/16/23 1723              Smitty Knudsen, PA-C 06/18/23 1502    Ernie Avena, MD 06/18/23 2326

## 2023-06-19 ENCOUNTER — Ambulatory Visit (HOSPITAL_COMMUNITY): Admission: RE | Admit: 2023-06-19 | Payer: Medicare Other | Source: Ambulatory Visit | Admitting: Orthopedic Surgery

## 2023-06-23 ENCOUNTER — Ambulatory Visit: Payer: Medicare Other | Admitting: Orthopedic Surgery

## 2023-07-03 ENCOUNTER — Telehealth: Payer: Self-pay | Admitting: Orthopedic Surgery

## 2023-07-03 NOTE — Telephone Encounter (Signed)
I tried to call her the VM was full.

## 2023-07-03 NOTE — Telephone Encounter (Signed)
Patient called back, she states that she got relief for 3 weeks with the injection, she is back to having back, buttock and pain in the right leg.  I did advise her that I don't think that she can get another injection till a few weeks after her knee surgery with Dr. Charlann Boxer due to her needing heal. She states that she is scheduled for 07/24/23 to see Dr. Christell Constant.

## 2023-07-03 NOTE — Telephone Encounter (Signed)
Patient called asked if Kristin Gilmore would give her a call because the injection she had has worn off and the pain is getting increasingly worse.   Patient said she is having surgery 07/15/2023 with Dr. Charlann Boxer. The number to contact patient is [phone]

## 2023-07-04 ENCOUNTER — Telehealth: Payer: Self-pay | Admitting: Orthopedic Surgery

## 2023-07-04 NOTE — Telephone Encounter (Signed)
See other message

## 2023-07-04 NOTE — Telephone Encounter (Signed)
Pt called returning call to [place]. Pt phone number is [phone].

## 2023-07-04 NOTE — Telephone Encounter (Signed)
I called and advised her of Dr. Kathi Der message. And she understands this

## 2023-07-04 NOTE — Telephone Encounter (Signed)
I tried to call---VM was full

## 2023-07-07 ENCOUNTER — Other Ambulatory Visit (HOSPITAL_COMMUNITY): Payer: Medicare Other

## 2023-07-08 NOTE — Progress Notes (Signed)
COVID Vaccine received:  []  No []  Yes Date of any COVID positive Test in last 90 days:  PCP - Jackelyn Poling DO Cardiologist - Previously saw Dr. Loman [place] Chest x-ray -  EKG -06/17/23 Epic   Stress Test - 04/10/17 Epic ECHO - 06/05/22 Epic Cardiac Cath -   Bowel Prep - [x]  No  []   Yes ______  Pacemaker / ICD device []  No []  Yes   Spinal Cord Stimulator:[]  No []  Yes       History of Sleep Apnea? []  No []  Yes   CPAP used?- []  No []  Yes    Does the patient monitor blood sugar?          []  No []  Yes  []  N/A  Patient has: []  NO Hx DM   []  Pre-DM                 []  DM1  []   DM2 Does patient have a [employer] or [employer]om? []  No []  Yes   Fasting Blood Sugar Ranges-  Checks Blood Sugar _____ times a day  GLP1 agonist / usual dose -  GLP1 instructions:  SGLT-2 inhibitors / usual dose -  SGLT-2 instructions:   Blood Thinner / Instructions: Aspirin Instructions:  Comments:   Activity level: Patient is able / unable to climb a flight of stairs without difficulty; []  No CP  []  No SOB, but would have ___   Patient can / can not perform ADLs without assistance.   Anesthesia review: Prior EKG showed poor R wave progression. Went to ED 06/13/23 for chest pain.  Patient denies shortness of breath, fever, cough and chest pain at PAT appointment.  Patient verbalized understanding and agreement to the Pre-Surgical Instructions that were given to them at this PAT appointment. Patient was also educated of the need to review these PAT instructions again prior to his/her surgery.I reviewed the appropriate phone numbers to call if they have any and questions or concerns.

## 2023-07-08 NOTE — Patient Instructions (Signed)
SURGICAL WAITING ROOM VISITATION  Patients having surgery or a procedure may have no more than 2 support people in the waiting area - these visitors may rotate.    Children under the age of 85 must have an adult with them who is not the patient.  Due to an increase in RSV and influenza rates and associated hospitalizations, children ages 62 and under may not visit patients in [facility] hospitals.  If the patient needs to stay at the hospital during part of their recovery, the visitor guidelines for inpatient rooms apply. Pre-op nurse will coordinate an appropriate time for 1 support person to accompany patient in pre-op.  This support person may not rotate.    Please refer to the [facility] website for the visitor guidelines for Inpatients (after your surgery is over and you are in a regular room).       Your procedure is scheduled on: 07/15/23   Report to [facility] Main Entrance    Report to admitting at  5:15 AM   Call this number if you have problems the morning of surgery [phone]   Do not eat food :After Midnight.   After Midnight you may have the following liquids until 4:15 AM DAY OF SURGERY  Water Non-Citrus Juices (without pulp, NO RED-Apple, White grape, White cranberry) Black Coffee (NO MILK/CREAM OR CREAMERS, sugar ok)  Clear Tea (NO MILK/CREAM OR CREAMERS, sugar ok) regular and decaf                             Plain Jell-O (NO RED)                                           Fruit ices (not with fruit pulp, NO RED)                                     Popsicles (NO RED)                                                               Sports drinks like Gatorade (NO RED)                  The day of surgery:  Drink ONE (1) Pre-Surgery G2 at 4:15  AM the morning of surgery. Drink in one sitting. Do not sip.  This drink was given to you during your hospital  pre-op appointment visit. Nothing else to drink after completing the  Pre-SurgeryG2.     Oral  Hygiene is also important to reduce your risk of infection.                                    Remember - BRUSH YOUR TEETH THE MORNING OF SURGERY WITH YOUR REGULAR TOOTHPASTE  DENTURES WILL BE REMOVED PRIOR TO SURGERY PLEASE DO NOT APPLY "Poly grip" OR ADHESIVES!!!   Stop all vitamins and herbal supplements 7 days before surgery.   Take these medicines the morning  of surgery with A SIP OF WATER: Amlodipine, Amoxicillin, sinemet, Carvedilol, Hycosamine, Rosuvastatin, Tylenol if needed.  DO NOT TAKE ANY ORAL DIABETIC MEDICATIONS DAY OF YOUR SURGERY             You may not have any metal on your body including hair pins, jewelry, and body piercing             Do not wear make-up, lotions, powders, perfumes/cologne, or deodorant  Do not wear nail polish including gel and S&S, artificial/acrylic nails, or any other type of covering on natural nails including finger and toenails. If you have artificial nails, gel coating, etc. that needs to be removed by a nail salon please have this removed prior to surgery or surgery may need to be canceled/ delayed if the surgeon/ anesthesia feels like they are unable to be safely monitored.   Do not shave  48 hours prior to surgery.               Men may shave face and neck.   Do not bring valuables to the hospital. [facility] IS NOT             RESPONSIBLE   FOR VALUABLES.   Contacts, glasses, dentures or bridgework may not be worn into surgery.   Bring small overnight bag day of surgery.   DO NOT BRING YOUR HOME MEDICATIONS TO THE HOSPITAL. PHARMACY WILL DISPENSE MEDICATIONS LISTED ON YOUR MEDICATION LIST TO YOU DURING YOUR ADMISSION IN THE HOSPITAL!    Patients discharged on the day of surgery will not be allowed to drive home.  Someone NEEDS to stay with you for the first 24 hours after anesthesia.   Special Instructions: Bring a copy of your healthcare power of attorney and living will documents the day of surgery if you haven't scanned them  before.              Please read over the following fact sheets you were given: IF YOU HAVE QUESTIONS ABOUT YOUR PRE-OP INSTRUCTIONS PLEASE CALL [phone] Rosey Bath   If you received a COVID test during your pre-op visit  it is requested that you wear a mask when out in public, stay away from anyone that may not be feeling well and notify your surgeon if you develop symptoms. If you test positive for Covid or have been in contact with anyone that has tested positive in the last 10 days please notify you surgeon.     - Preparing for Surgery Before surgery, you can play an important role.  Because skin is not sterile, your skin needs to be as free of germs as possible.  You can reduce the number of germs on your skin by washing with CHG (chlorahexidine gluconate) soap before surgery.  CHG is an antiseptic cleaner which kills germs and bonds with the skin to continue killing germs even after washing. Please DO NOT use if you have an allergy to CHG or antibacterial soaps.  If your skin becomes reddened/irritated stop using the CHG and inform your nurse when you arrive at Short Stay. Do not shave (including legs and underarms) for at least 48 hours prior to the first CHG shower.  You may shave your face/neck.  Please follow these instructions carefully:  1.  Shower with CHG Soap the night before surgery and the  morning of surgery.  2.  If you choose to wash your hair, wash your hair first as usual with your normal  shampoo.  3.  After you shampoo, rinse your hair and body thoroughly to remove the shampoo.                             4.  Use CHG as you would any other liquid soap.  You can apply chg directly to the skin and wash.  Gently with a scrungie or clean washcloth.  5.  Apply the CHG Soap to your body ONLY FROM THE NECK DOWN.   Do   not use on face/ open                           Wound or open sores. Avoid contact with eyes, ears mouth and   genitals (private parts).                        Wash face,  Genitals (private parts) with your normal soap.             6.  Wash thoroughly, paying special attention to the area where your    surgery  will be performed.  7.  Thoroughly rinse your body with warm water from the neck down.  8.  DO NOT shower/wash with your normal soap after using and rinsing off the CHG Soap.                9.  Pat yourself dry with a clean towel.            10.  Wear clean pajamas.            11.  Place clean sheets on your bed the night of your first shower and do not  sleep with pets. Day of Surgery : Do not apply any lotions/deodorants the morning of surgery.  Please wear clean clothes to the hospital/surgery center.  FAILURE TO FOLLOW THESE INSTRUCTIONS MAY RESULT IN THE CANCELLATION OF YOUR SURGERY  PATIENT SIGNATURE_________________________________  NURSE SIGNATURE__________________________________   ________________________________________________________________________Incentive Spirometer   An incentive spirometer is a tool that can help keep your lungs clear and active. This tool measures how well you are filling your lungs with each breath. Taking long deep breaths may help reverse or decrease the chance of developing breathing (pulmonary) problems (especially infection) following: A long period of time when you are unable to move or be active. BEFORE THE PROCEDURE  If the spirometer includes an indicator to show your best effort, your nurse or respiratory therapist will set it to a desired goal. If possible, sit up straight or lean slightly forward. Try not to slouch. Hold the incentive spirometer in an upright position. INSTRUCTIONS FOR USE  Sit on the edge of your bed if possible, or sit up as far as you can in bed or on a chair. Hold the incentive spirometer in an upright position. Breathe out normally. Place the mouthpiece in your mouth and seal your lips tightly around it. Breathe in slowly and as deeply as possible, raising the  piston or the ball toward the top of the column. Hold your breath for 3-5 seconds or for as long as possible. Allow the piston or ball to fall to the bottom of the column. Remove the mouthpiece from your mouth and breathe out normally. Rest for a few seconds and repeat Steps 1 through 7 at least 10 times every 1-2 hours when you are awake. Take your time and take a few  normal breaths between deep breaths. The spirometer may include an indicator to show your best effort. Use the indicator as a goal to work toward during each repetition. After each set of 10 deep breaths, practice coughing to be sure your lungs are clear. If you have an incision (the cut made at the time of surgery), support your incision when coughing by placing a pillow or rolled up towels firmly against it. Once you are able to get out of bed, walk around indoors and cough well. You may stop using the incentive spirometer when instructed by your caregiver.  RISKS AND COMPLICATIONS Take your time so you do not get dizzy or light-headed. If you are in pain, you may need to take or ask for pain medication before doing incentive spirometry. It is harder to take a deep breath if you are having pain. AFTER USE Rest and breathe slowly and easily. It can be helpful to keep track of a log of your progress. Your caregiver can provide you with a simple table to help with this. If you are using the spirometer at home, follow these instructions: SEEK MEDICAL CARE IF:  You are having difficultly using the spirometer. You have trouble using the spirometer as often as instructed. Your pain medication is not giving enough relief while using the spirometer. You develop fever of 100.5 F (38.1 C) or higher. SEEK IMMEDIATE MEDICAL CARE IF:  You cough up bloody sputum that had not been present before. You develop fever of 102 F (38.9 C) or greater. You develop worsening pain at or near the incision site. MAKE SURE YOU:  Understand these  instructions. Will watch your condition. Will get help right away if you are not doing well or get worse. Document Released: 01/27/2007 Document Revised: 12/09/2011 Document Reviewed: 03/30/2007 [facility] Patient Information 2014 [place], [place].

## 2023-07-10 ENCOUNTER — Encounter (HOSPITAL_COMMUNITY)
Admission: RE | Admit: 2023-07-10 | Discharge: 2023-07-10 | Disposition: A | Discharge status: Home / Self Care | Payer: Medicare Other | Source: Ambulatory Visit | Attending: Anesthesiology | Admitting: Anesthesiology

## 2023-07-10 NOTE — Progress Notes (Signed)
  Bowel Prep reminder:   For Anesthesia: PCP -  Cardiologist -   Chest x-ray -  EKG -  Stress Test -  ECHO -  Cardiac Cath -  Pacemaker/ICD device last checked: Pacemaker orders received: Device Rep notified:  Spinal Cord Stimulator:  Sleep Study -  CPAP -   Fasting Blood Sugar -  Checks Blood Sugar _____ times a day Date and result of last Hgb A1c-  Last dose of GLP1 agonist-  GLP1 instructions:   Last dose of SGLT-2 inhibitors-  SGLT-2 instructions:   Blood Thinner Instructions: Aspirin Instructions: Last Dose:  Activity level: Can go up a flight of stairs and activities of daily living without stopping and without chest pain and/or shortness of breath   Able to exercise without chest pain and/or shortness of breath   Unable to go up a flight of stairs without chest pain and/or shortness of breath     Anesthesia review:   Patient denies shortness of breath, fever, cough and chest pain at PAT appointment   Patient verbalized understanding of instructions that were given to them at the PAT appointment. Patient was also instructed that they will need to review over the PAT instructions again at home before surgery.

## 2023-07-11 NOTE — Patient Instructions (Addendum)
SURGICAL WAITING ROOM VISITATION Patients having surgery or a procedure may have no more than 2 support people in the waiting area - these visitors may rotate.    Children under the age of 85 must have an adult with them who is not the patient.  If the patient needs to stay at the hospital during part of their recovery, the visitor guidelines for inpatient rooms apply. Pre-op nurse will coordinate an appropriate time for 1 support person to accompany patient in pre-op.  This support person may not rotate.    Please refer to the [facility] website for the visitor guidelines for Inpatients (after your surgery is over and you are in a regular room).       Your procedure is scheduled on: 07-15-23   Report to [facility] Main Entrance    Report to admitting at 5:15 AM   Call this number if you have problems the morning of surgery [phone]   Do not eat food :After Midnight.   After Midnight you may have the following liquids until 4:15 AM DAY OF SURGERY  Water Non-Citrus Juices (without pulp, NO RED-Apple, White grape, White cranberry) Black Coffee (NO MILK/CREAM OR CREAMERS, sugar ok)  Clear Tea (NO MILK/CREAM OR CREAMERS, sugar ok) regular and decaf                             Plain Jell-O (NO RED)                                           Fruit ices (not with fruit pulp, NO RED)                                     Popsicles (NO RED)                                                               Sports drinks like Gatorade (NO RED)                   The day of surgery:  Drink ONE (1) Pre-Surgery  G2 by 4:15 AM the morning of surgery. Drink in one sitting. Do not sip.  This drink was given to you during your hospital  pre-op appointment visit. Nothing else to drink after completing the Pre-Surgery G2.          If you have questions, please contact your surgeon's office.   FOLLOW  ANY ADDITIONAL PRE OP INSTRUCTIONS YOU RECEIVED FROM YOUR SURGEON'S OFFICE!!!      Oral Hygiene is also important to reduce your risk of infection.                                    Remember - BRUSH YOUR TEETH THE MORNING OF SURGERY WITH YOUR REGULAR TOOTHPASTE   Do NOT smoke after Midnight   Take these medicines the morning of surgery with A SIP OF WATER:   Amlodipine  Carbidopa-Levodopa  Carvedilol  Divalproex  Pregabalin  Tylenol if needed  Stop all vitamins and herbal supplements 7 days before surgery                              You may not have any metal on your body including hair pins, jewelry, and body piercing             Do not wear make-up, lotions, powders, perfumes or deodorant  Do not wear nail polish including gel and S&S, artificial/acrylic nails, or any other type of covering on natural nails including finger and toenails. If you have artificial nails, gel coating, etc. that needs to be removed by a nail salon please have this removed prior to surgery or surgery may need to be canceled/ delayed if the surgeon/ anesthesia feels like they are unable to be safely monitored.   Do not shave  48 hours prior to surgery.       Do not bring valuables to the hospital. [facility] IS NOT RESPONSIBLE   FOR VALUABLES.   Contacts, dentures or bridgework may not be worn into surgery.   Bring small overnight bag day of surgery.   DO NOT BRING YOUR HOME MEDICATIONS TO THE HOSPITAL. PHARMACY WILL DISPENSE MEDICATIONS LISTED ON YOUR MEDICATION LIST TO YOU DURING YOUR ADMISSION IN THE HOSPITAL!    Special Instructions: Bring a copy of your healthcare power of attorney and living will documents the day of surgery if you haven't scanned them before.              Please read over the following fact sheets you were given: IF YOU HAVE QUESTIONS ABOUT YOUR PRE-OP INSTRUCTIONS PLEASE CALL [phone] [name]  If you received a COVID test during your pre-op visit  it is requested that you wear a mask when out in public, stay away from anyone that may not be feeling well  and notify your surgeon if you develop symptoms. If you test positive for Covid or have been in contact with anyone that has tested positive in the last 10 days please notify you surgeon.  [facility] - Preparing for Surgery Before surgery, you can play an important role.  Because skin is not sterile, your skin needs to be as free of germs as possible.  You can reduce the number of germs on your skin by washing with CHG (chlorahexidine gluconate) soap before surgery.  CHG is an antiseptic cleaner which kills germs and bonds with the skin to continue killing germs even after washing. Please DO NOT use if you have an allergy to CHG or antibacterial soaps.  If your skin becomes reddened/irritated stop using the CHG and inform your nurse when you arrive at Short Stay. Do not shave (including legs and underarms) for at least 48 hours prior to the first CHG shower.  You may shave your face/neck.  Please follow these instructions carefully:  1.  Shower with CHG Soap the night before surgery and the  morning of surgery.  2.  If you choose to wash your hair, wash your hair first as usual with your normal  shampoo.  3.  After you shampoo, rinse your hair and body thoroughly to remove the shampoo.                             4.  Use CHG as you would any other liquid soap.  You  can apply chg directly to the skin and wash.  Gently with a scrungie or clean washcloth.  5.  Apply the CHG Soap to your body ONLY FROM THE NECK DOWN.   Do   not use on face/ open                           Wound or open sores. Avoid contact with eyes, ears mouth and   genitals (private parts).                       Wash face,  Genitals (private parts) with your normal soap.             6.  Wash thoroughly, paying special attention to the area where your    surgery  will be performed.  7.  Thoroughly rinse your body with warm water from the neck down.  8.  DO NOT shower/wash with your normal soap after using and rinsing off the CHG  Soap.                9.  Pat yourself dry with a clean towel.            10.  Wear clean pajamas.            11.  Place clean sheets on your bed the night of your first shower and do not  sleep with pets. Day of Surgery : Do not apply any lotions/deodorants the morning of surgery.  Please wear clean clothes to the hospital/surgery center.  FAILURE TO FOLLOW THESE INSTRUCTIONS MAY RESULT IN THE CANCELLATION OF YOUR SURGERY  PATIENT SIGNATURE_________________________________  NURSE SIGNATURE__________________________________  ________________________________________________________________________    Kristin Gilmore  An incentive spirometer is a tool that can help keep your lungs clear and active. This tool measures how well you are filling your lungs with each breath. Taking long deep breaths may help reverse or decrease the chance of developing breathing (pulmonary) problems (especially infection) following: A long period of time when you are unable to move or be active. BEFORE THE PROCEDURE  If the spirometer includes an indicator to show your best effort, your nurse or respiratory therapist will set it to a desired goal. If possible, sit up straight or lean slightly forward. Try not to slouch. Hold the incentive spirometer in an upright position. INSTRUCTIONS FOR USE  Sit on the edge of your bed if possible, or sit up as far as you can in bed or on a chair. Hold the incentive spirometer in an upright position. Breathe out normally. Place the mouthpiece in your mouth and seal your lips tightly around it. Breathe in slowly and as deeply as possible, raising the piston or the ball toward the top of the column. Hold your breath for 3-5 seconds or for as long as possible. Allow the piston or ball to fall to the bottom of the column. Remove the mouthpiece from your mouth and breathe out normally. Rest for a few seconds and repeat Steps 1 through 7 at least 10 times every 1-2 hours  when you are awake. Take your time and take a few normal breaths between deep breaths. The spirometer may include an indicator to show your best effort. Use the indicator as a goal to work toward during each repetition. After each set of 10 deep breaths, practice coughing to be sure your lungs are clear. If you have an incision (the cut made  at the time of surgery), support your incision when coughing by placing a pillow or rolled up towels firmly against it. Once you are able to get out of bed, walk around indoors and cough well. You may stop using the incentive spirometer when instructed by your caregiver.  RISKS AND COMPLICATIONS Take your time so you do not get dizzy or light-headed. If you are in pain, you may need to take or ask for pain medication before doing incentive spirometry. It is harder to take a deep breath if you are having pain. AFTER USE Rest and breathe slowly and easily. It can be helpful to keep track of a log of your progress. Your caregiver can provide you with a simple table to help with this. If you are using the spirometer at home, follow these instructions: SEEK MEDICAL CARE IF:  You are having difficultly using the spirometer. You have trouble using the spirometer as often as instructed. Your pain medication is not giving enough relief while using the spirometer. You develop fever of 100.5 F (38.1 C) or higher. SEEK IMMEDIATE MEDICAL CARE IF:  You cough up bloody sputum that had not been present before. You develop fever of 102 F (38.9 C) or greater. You develop worsening pain at or near the incision site. MAKE SURE YOU:  Understand these instructions. Will watch your condition. Will get help right away if you are not doing well or get worse. Document Released: 01/27/2007 Document Revised: 12/09/2011 Document Reviewed: 03/30/2007 [facility] Patient Information 2014 [place], [place].   ________________________________________________________________________

## 2023-07-11 NOTE — Progress Notes (Addendum)
COVID Vaccine Completed:  Date of COVID positive in last 13 days:No  PCP - Jackelyn Poling, DO Cardiologist -  last saw Maxine Glenn, PA-C in 2021  Chest x-ray - 06-16-23 Epic EKG - 06-17-23 Epic Stress Test - 04-10-17 Epic ECHO - 06-05-22 Epic Cardiac Cath - N/A Pacemaker/ICD device last checked: Spinal Cord Stimulator:N/A  Bowel Prep - N/A  Sleep Study - N/A CPAP -    Fasting Blood Sugar - N/A Checks Blood Sugar _____ times a day  Last dose of GLP1 agonist-  N/A GLP1 instructions:  N/A   Last dose of SGLT-2 inhibitors-  N/A SGLT-2 instructions: N/A   Blood Thinner Instructions:  Time Aspirin Instructions:  ASA 81. Has not taken Aspirin in a few months.   Last Dose:  Activity level: Difficulty climbing stairs due to severe knee pain.  Able to perform activities of daily living without stopping and without symptoms of chest pain or shortness of breath.  Patient lives alone   Anesthesia review:  Seen in the ER on 06-16-23 for chest pain.  Patient states that she has not had any further episodes of chest pain since ER visit.  Parkinson's, HTN .  Poor R wave progression on EKG from 06-13-23  Parkinson's disease with severe hand tremors.    Patient states that they are doing further testing because unsure if tremor is Parkinsons   Patient denies shortness of breath, fever, cough and chest pain at PAT appointment  Patient verbalized understanding of instructions that were given to them at the PAT appointment. Patient was also instructed that they will need to review over the PAT instructions again at home before surgery.

## 2023-07-14 ENCOUNTER — Telehealth: Payer: Self-pay | Admitting: *Deleted

## 2023-07-14 ENCOUNTER — Other Ambulatory Visit: Payer: Self-pay

## 2023-07-14 ENCOUNTER — Encounter (HOSPITAL_COMMUNITY): Payer: Self-pay

## 2023-07-14 ENCOUNTER — Encounter (HOSPITAL_COMMUNITY): Payer: Self-pay | Admitting: Anesthesiology

## 2023-07-14 ENCOUNTER — Encounter (HOSPITAL_COMMUNITY)
Admission: RE | Admit: 2023-07-14 | Discharge: 2023-07-14 | Disposition: A | Discharge status: Home / Self Care | Payer: Medicare Other | Source: Ambulatory Visit | Attending: Orthopedic Surgery | Admitting: Orthopedic Surgery

## 2023-07-14 NOTE — Anesthesia Preprocedure Evaluation (Signed)
Anesthesia Evaluation    Reviewed: Allergy & Precautions, Patient's Chart, lab work & pertinent test results  Airway        Dental   Pulmonary neg pulmonary ROS          Cardiovascular hypertension, Pt. on medications and Pt. on home beta blockers      Neuro/Psych  Headaches PSYCHIATRIC DISORDERS Anxiety Depression Bipolar Disorder   Parkinsons    GI/Hepatic negative GI ROS, Neg liver ROS,,,  Endo/Other  diabetes (prediabetic)    Renal/GU negative Renal ROS  negative genitourinary   Musculoskeletal  (+) Arthritis , Osteoarthritis,    Abdominal   Peds  Hematology negative hematology ROS (+)   Anesthesia Other Findings   Reproductive/Obstetrics negative OB ROS                             Anesthesia Physical Anesthesia Plan  ASA: 3  Anesthesia Plan: MAC, Regional and Spinal   Post-op Pain Management: Regional block* and Tylenol PO (pre-op)*   Induction:   PONV Risk Score and Plan: 2 and Propofol infusion and TIVA  Airway Management Planned: Natural Airway and Simple Face Mask  Additional Equipment: None  Intra-op Plan:   Post-operative Plan:   Informed Consent:   Plan Discussed with:   Anesthesia Plan Comments:        Anesthesia Quick Evaluation

## 2023-07-14 NOTE — Telephone Encounter (Signed)
Pre-operative Risk Assessment    Patient Name: Kristin Gilmore  DOB: [birth year] MRN: [id number]  DATE OF LAST VISIT: 02/16/2020 Otilio Saber, PAC DATE OF NEXT VISIT: 08/14/23 DR. TOBB -NEW PT APPT ; PT LAST SEEN 02/16/2020    Request for Surgical Clearance    Procedure:   RIGHT KNEE OPEN EXCISION FOR REMOVAL OF LOOSE FRAGMENTS  Date of Surgery:  Clearance TBD                                 Surgeon:  DR. MATTHEW OLIN Surgeon's Group or Practice Name:  [facility] Phone number:  [phone] ATTN: Rosalva Ferron Fax number:  [phone]    Type of Clearance Requested:   - Medical ; ASA    Type of Anesthesia:  Spinal   Additional requests/questions:    Elpidio Anis   07/14/2023, 4:26 PM

## 2023-07-14 NOTE — Progress Notes (Signed)
Anesthesia Chart Review   Case: [id number] Date/Time: 07/15/23 0700   Procedure: Right knee open excision for removal loose fragments (Right: Knee)   Anesthesia type: MAC, Regional, Spinal   Pre-op diagnosis: Right patella fracture with loose intraarticular fragments   Location: [facility] ROOM 09 / [facility]   Surgeons: Durene Romans, MD       DISCUSSION:72 y.o. never smoker with h/o HTN, Parkinson's disease, right patella fracture with loose intraarticular fragments scheduled for above procedure 07/15/2023 with Dr. Durene Romans.   Pt seen in ED 06/16/2023 due to chest pain.  Pt experienced chest pain while in recovery following a dental procedure.  Workup unremarkable, no recurrence of chest pain. Cardiology consulted who felt she was safe for outpatient follow up, heart score 4 placing her at moderate risk. Referral to cardiology made.   Discussed with Dr. Salvadore Farber.  Will need cardiology f/u/clearance before proceeding.  VS: BP 118/84   Pulse 78   Temp 36.9 C (Oral)   Resp 16   Ht 5\' 7"  (1.702 m)   Wt 83.9 kg   SpO2 96%   BMI 28.98 kg/m   PROVIDERS: Jackelyn Poling, DO is PCP    LABS: Labs reviewed: Acceptable for surgery. (all labs ordered are listed, but only abnormal results are displayed)  Labs Reviewed - No data to display   IMAGES:   EKG:   CV: Echo 06/05/2022 1. Left ventricular ejection fraction, by estimation, is 60 to 65%. The  left ventricle has normal function.   2. Right ventricular systolic function is normal. The right ventricular  size is normal.   3. Left atrial size was severely dilated. No left atrial/left atrial  appendage thrombus was detected.   4. The mitral valve is grossly normal. Mild mitral valve regurgitation.   5. The aortic valve is tricuspid. Aortic valve regurgitation is not  visualized.   6. Agitated saline contrast bubble study was negative, with no evidence  of any interatrial shunt.   Conclusion(s)/Recommendation(s): No evidence of  vegetation/infective  endocarditis on this transesophageael echocardiogram.  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   Pneumonia    Pre-diabetes    "i was told i was pre-diabetic a while ago"   Tremor 04/13/2014    Past Surgical History:  Procedure Laterality Date   CHOLECYSTECTOMY     COLONOSCOPY W/ POLYPECTOMY     LUMBAR LAMINECTOMY  07/2020   3 level decompression - [place], [place]   LUMBAR LAMINECTOMY/DECOMPRESSION MICRODISCECTOMY N/A 12/19/2017   Procedure: LAMINECTOMY LUMBAR TWO- LUMBAR THREE, LUMBAR THREE- LUMBAR FOUR, LUMBAR FOUR- LUMBAR FIVE ;  Surgeon: Lisbeth Renshaw, MD;  Location: [facility];  Service: Neurosurgery;  Laterality: N/A;   NASAL SINUS SURGERY     RADIOLOGY WITH ANESTHESIA N/A 05/30/2022   Procedure: MRI WITH ANESTHESIA LUMBAR SPINE W/WO CONSTRAST;  Surgeon: Radiologist, Medication, MD;  Location: [facility] OR;  Service: Radiology;  Laterality: N/A;   TEE WITHOUT CARDIOVERSION N/A 06/05/2022   Procedure: TRANSESOPHAGEAL ECHOCARDIOGRAM (TEE);  Surgeon: Chrystie Nose, MD;  Location: [facility] ENDOSCOPY;  Service: Cardiovascular;  Laterality: N/A;   TONSILLECTOMY     TOTAL  KNEE ARTHROPLASTY Right 06/26/2021   Procedure: RIGHT TOTAL KNEE ARTHROPLASTY;  Surgeon: Tarry Kos, MD;  Location: [facility] OR;  Service: Orthopedics;  Laterality: Right;   WISDOM TOOTH EXTRACTION      MEDICATIONS:  acetaminophen (TYLENOL) 500 MG tablet   amitriptyline (ELAVIL) 10 MG tablet   amLODipine (NORVASC) 5 MG tablet   amoxicillin (AMOXIL) 500 MG capsule   aspirin EC 81 MG tablet   bisacodyl (DULCOLAX) 5 MG EC tablet   BLACK COHOSH PO   Calcium  Citrate-Vitamin D (CALCIUM CITRATE + D PO)   carbidopa-levodopa (SINEMET IR) 25-100 MG tablet   carvedilol (COREG) 12.5 MG tablet   Cholecalciferol (VITAMIN D3) 50 MCG (2000 UT) capsule   Coenzyme Q10 (COQ10) 100 MG CAPS   diazepam (VALIUM) 5 MG tablet   divalproex (DEPAKOTE ER) 250 MG 24 hr tablet   docusate sodium (COLACE) 100 MG capsule   Homeopathic Products (THERAWORX RELIEF EX)   hyoscyamine (ANASPAZ) 0.125 MG TBDP disintergrating tablet   ibuprofen (ADVIL) 200 MG tablet   loperamide (IMODIUM) 2 MG capsule   MAGNESIUM CITRATE PO   Magnesium Oxide 500 MG TABS   Menthol, Topical Analgesic, (BIOFREEZE) 4 % GEL   Omega-3 Fatty Acids (FISH OIL) 500 MG CAPS   OVER THE COUNTER MEDICATION   OVER THE COUNTER MEDICATION   pregabalin (LYRICA) 75 MG capsule   QUEtiapine (SEROQUEL) 300 MG tablet   Red Yeast Rice 600 MG CAPS   rosuvastatin (CRESTOR) 20 MG tablet   telmisartan (MICARDIS) 80 MG tablet   Turmeric 500 MG CAPS   No current facility-administered medications for this encounter.     Jodell Cipro Ward, PA-C [facility] [phone]

## 2023-07-15 ENCOUNTER — Ambulatory Visit (HOSPITAL_COMMUNITY): Admission: RE | Admit: 2023-07-15 | Payer: Medicare Other | Source: Home / Self Care | Admitting: Orthopedic Surgery

## 2023-07-15 ENCOUNTER — Encounter (HOSPITAL_COMMUNITY): Admission: RE | Payer: Self-pay | Source: Ambulatory Visit

## 2023-07-15 ENCOUNTER — Encounter (HOSPITAL_COMMUNITY): Admission: RE | Payer: Self-pay | Source: Home / Self Care

## 2023-07-15 SURGERY — IRRIGATION AND DEBRIDEMENT KNEE
Anesthesia: Spinal | Site: Knee | Laterality: Right

## 2023-07-17 ENCOUNTER — Ambulatory Visit: Payer: Medicare Other | Attending: Cardiology | Admitting: Cardiology

## 2023-07-17 ENCOUNTER — Encounter: Payer: Self-pay | Admitting: Cardiology

## 2023-07-17 VITALS — BP 130/70 | HR 60 | Ht 67.0 in | Wt 191.7 lb

## 2023-07-17 DIAGNOSIS — I10 Essential (primary) hypertension: Secondary | ICD-10-CM | POA: Diagnosis not present

## 2023-07-17 DIAGNOSIS — R079 Chest pain, unspecified: Secondary | ICD-10-CM

## 2023-07-17 DIAGNOSIS — Z01812 Encounter for preprocedural laboratory examination: Secondary | ICD-10-CM | POA: Diagnosis not present

## 2023-07-17 DIAGNOSIS — R7303 Prediabetes: Secondary | ICD-10-CM | POA: Diagnosis not present

## 2023-07-17 DIAGNOSIS — E782 Mixed hyperlipidemia: Secondary | ICD-10-CM

## 2023-07-17 NOTE — Progress Notes (Signed)
Cardiology Office Note:    Date:  07/17/2023   ID:  Kristin Gilmore, DOB Dec 02, 1950, MRN [id number]  PCP:  Jackelyn Poling, DO  Cardiologist:  Thomasene Ripple, DO  Electrophysiologist:  None   Referring MD: Smitty Knudsen, PA-C    " I am ok"    History of Present Illness:    Kristin Gilmore is a 72 y.o. female  with a history of dyslipidemia, hypertension, prediabetes, Parkinson's disease, arthritis, and anxiety presents with chest discomfort. The discomfort is described as a sudden onset of severe pain and tightness across the chest, which has occurred several times at home over the past few years. The most recent episode occurred after dental work, during which she also experienced shaking. The pain lasted approximately ten minutes and was relieved by medication. The patient denies shortness of breath but reports difficulty walking due to a bad back and a knee that requires surgery. She has no family history of heart disease.    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   Pneumonia    Pre-diabetes    "i was told i was pre-diabetic a while ago"   Tremor 04/13/2014    Past Surgical History:  Procedure Laterality Date   CHOLECYSTECTOMY     COLONOSCOPY W/ POLYPECTOMY     LUMBAR LAMINECTOMY  07/2020   3 level decompression - [place], [place]   LUMBAR LAMINECTOMY/DECOMPRESSION MICRODISCECTOMY N/A 12/19/2017   Procedure: LAMINECTOMY LUMBAR TWO- LUMBAR THREE, LUMBAR THREE- LUMBAR FOUR, LUMBAR FOUR- LUMBAR FIVE ;  Surgeon: Lisbeth Renshaw, MD;  Location: [facility] OR;  Service: Neurosurgery;  Laterality: N/A;   NASAL SINUS SURGERY      RADIOLOGY WITH ANESTHESIA N/A 05/30/2022   Procedure: MRI WITH ANESTHESIA LUMBAR SPINE W/WO CONSTRAST;  Surgeon: Radiologist, Medication, MD;  Location: [facility] OR;  Service: Radiology;  Laterality: N/A;   TEE WITHOUT CARDIOVERSION N/A 06/05/2022   Procedure: TRANSESOPHAGEAL ECHOCARDIOGRAM (TEE);  Surgeon: Chrystie Nose, MD;  Location[facility] ENDOSCOPY;  Service: Cardiovascular;  Laterality: N/A;   TONSILLECTOMY     TOTAL KNEE ARTHROPLASTY Right 06/26/2021   Procedure: RIGHT TOTAL KNEE ARTHROPLASTY;  Surgeon: Tarry Kos, MD;  Location: [facility] OR;  Service: Orthopedics;  Laterality: Right;   WISDOM TOOTH EXTRACTION      Current Medications: Current Meds  Medication Sig   acetaminophen (TYLENOL) 500 MG tablet Take 1,000 mg by mouth every 6 (six) hours as needed for mild pain or moderate pain.   amitriptyline (ELAVIL) 10 MG tablet Take 1 tablet (10 mg total) by mouth at bedtime as needed for sleep. (Patient taking differently: Take 25 mg by mouth at bedtime.)   amLODipine (NORVASC) 5 MG tablet Take 5 mg by mouth 2 (two) times daily.   bisacodyl (DULCOLAX) 5 MG EC tablet Take 5-15 mg by mouth at bedtime as needed (constipation).   BLACK COHOSH PO Take 1 tablet by mouth daily.   Calcium Citrate-Vitamin D (CALCIUM CITRATE + D PO) Take 1-2 capsules by mouth See admin instructions. Calcium 200 mg, vitamin D3 2.5 mcg - 100 units - take 1 tablet by mouth with breakfast and  2 tablets with supper   carbidopa-levodopa (SINEMET IR) 25-100 MG tablet Take 1 tablet by mouth 4 (four) times daily.   carvedilol (COREG) 12.5 MG tablet Take 12.5 mg by mouth 2 (two) times daily.   Cholecalciferol (VITAMIN D3) 50 MCG (2000 UT) capsule Take 2,000 Units by mouth daily.   Coenzyme Q10 (COQ10) 100 MG CAPS Take 100 mg by mouth daily with supper.   divalproex (DEPAKOTE ER) 250 MG 24 hr tablet Take 3 tablets (750 mg total) by mouth daily. (Patient taking differently: Take 125-375 mg by mouth See admin instructions. Take 125 mg in  morning and 375 mg at bedtime)   docusate sodium (COLACE) 100 MG capsule Take 200-300 mg by mouth at bedtime.   Homeopathic Products [facility] RELIEF EX) Apply 1 application  topically daily as needed (Knee pain).   hyoscyamine (ANASPAZ) 0.125 MG TBDP disintergrating tablet Take 0.125 mg by mouth daily as needed (Stomach pain).   ibuprofen (ADVIL) 200 MG tablet Take 400 mg by mouth daily as needed for headache.   loperamide (IMODIUM) 2 MG capsule Take 2 mg by mouth 4 (four) times daily as needed for diarrhea or loose stools.   MAGNESIUM CITRATE PO Take 150 mg by mouth at bedtime.   Magnesium Oxide 500 MG TABS Take 500 mg by mouth at bedtime.   Menthol, Topical Analgesic, (BIOFREEZE) 4 % GEL Apply 1 application  topically 4 (four) times daily as needed (pain).   Omega-3 Fatty Acids (FISH OIL) 500 MG CAPS Take 500 mg by mouth 2 (two) times daily. Super   OVER THE COUNTER MEDICATION Take 50 mg by mouth 2 (two) times daily. PETADOLEX - Health brain blood vessel relaxation (butterburr root extract)   OVER THE COUNTER MEDICATION Apply 1 application  topically daily as needed (Pain). Bengay   pregabalin (LYRICA) 75 MG capsule Take 1 capsule (75 mg total) by mouth 3 (three) times daily.   QUEtiapine (SEROQUEL) 300 MG tablet Take 0.5 tablets (150 mg total) by mouth at bedtime as needed. (Patient taking differently: Take 75 mg by mouth at bedtime.)   Red Yeast Rice 600 MG CAPS Take 1,200 mg by mouth 2 (two) times daily with a meal.   rosuvastatin (CRESTOR) 20 MG tablet Take 20 mg by mouth every Monday, Wednesday, and Friday. Dinner   telmisartan (MICARDIS) 80 MG tablet Take 80 mg by mouth at bedtime.   Turmeric 500 MG CAPS Take 500 mg by mouth 2 (two) times daily with a meal. extract     Allergies:   Levofloxacin, Amoxicillin, Clarithromycin, and Atropine   Social History   Socioeconomic History   Marital status: Widowed    Spouse name: Public house manager   Number of children: 1   Years of education: college    Highest education level: Not on file  Occupational History   Occupation: Retired  Tobacco Use   Smoking status: Never   Smokeless tobacco: Never  Vaping Use   Vaping status: Never Used  Substance and Sexual Activity   Alcohol use: Not Currently   Drug use: No   Sexual activity: Not on file  Other Topics Concern   Not on file  Social History Narrative   Lives at home, married   Patient does not drink caffeine.   Patient is right handed.   Social Determinants of Health   Financial Resource Strain: Not on file  Food Insecurity: Not on file  Transportation Needs: Not on file  Physical Activity: Not on file  Stress: Not on  file  Social Connections: Unknown (09/17/2022)   Received from [facility]   Social Network    Social Network: Not on file     Family History: The patient's family history includes Cancer in her father; Migraines in her mother.  ROS:   Review of Systems  Constitution: Negative for decreased appetite, fever and weight gain.  HENT: Negative for congestion, ear discharge, hoarse voice and sore throat.   Eyes: Negative for discharge, redness, vision loss in right eye and visual halos.  Cardiovascular: Negative for chest pain, dyspnea on exertion, leg swelling, orthopnea and palpitations.  Respiratory: Negative for cough, hemoptysis, shortness of breath and snoring.   Endocrine: Negative for heat intolerance and polyphagia.  Hematologic/Lymphatic: Negative for bleeding problem. Does not bruise/bleed easily.  Skin: Negative for flushing, nail changes, rash and suspicious lesions.  Musculoskeletal: Negative for arthritis, joint pain, muscle cramps, myalgias, neck pain and stiffness.  Gastrointestinal: Negative for abdominal pain, bowel incontinence, diarrhea and excessive appetite.  Genitourinary: Negative for decreased libido, genital sores and incomplete emptying.  Neurological: Negative for brief paralysis, focal weakness, headaches and loss  of balance.  Psychiatric/Behavioral: Negative for altered mental status, depression and suicidal ideas.  Allergic/Immunologic: Negative for HIV exposure and persistent infections.    EKGs/Labs/Other Studies Reviewed:    The following studies were reviewed today:   EKG:  The ekg ordered today demonstrates   Recent Labs: 06/16/2023: ALT 9; BUN 28; Creatinine, Ser 1.06; Hemoglobin 13.0; Platelets 195; Potassium 4.3; Sodium 139  Recent Lipid Panel    Component Value Date/Time   CHOL  04/07/2008 0630    166        ATP III CLASSIFICATION:  <200     mg/dL   Desirable  027-253  mg/dL   Borderline High  >=664    mg/dL   High   TRIG 86 40/34/7425 0630   HDL 64 04/07/2008 0630   CHOLHDL 2.6 04/07/2008 0630   VLDL 17 04/07/2008 0630   LDLCALC  04/07/2008 0630    85        Total Cholesterol/HDL:CHD Risk Coronary Heart Disease Risk Table                     Men   Women  1/2 Average Risk   3.4   3.3    Physical Exam:    VS:  BP 130/70 (BP Location: Right Arm, Patient Position: Sitting, Cuff Size: Normal)   Pulse 60   Ht 5\' 7"  (1.702 m)   Wt 191 lb 11.2 oz (87 kg)   SpO2 97%   BMI 30.02 kg/m     Wt Readings from Last 3 Encounters:  07/17/23 191 lb 11.2 oz (87 kg)  07/14/23 185 lb (83.9 kg)  06/16/23 189 lb 9.5 oz (86 kg)     GEN: Well nourished, well developed in no acute distress HEENT: Normal NECK: No JVD; No carotid bruits LYMPHATICS: No lymphadenopathy CARDIAC: S1S2 noted,RRR, no murmurs, rubs, gallops RESPIRATORY:  Clear to auscultation without rales, wheezing or rhonchi  ABDOMEN: Soft, non-tender, non-distended, +bowel sounds, no guarding. EXTREMITIES: No edema, No cyanosis, no clubbing MUSCULOSKELETAL:  No deformity  SKIN: Warm and dry NEUROLOGIC:  Alert and oriented x 3, non-focal PSYCHIATRIC:  Normal affect, good insight  ASSESSMENT:    1. Chest pain, unspecified type   2. Pre-procedure lab exam   3. Prediabetes   4. Essential hypertension   5. Mixed  hyperlipidemia    PLAN:  Chest Pain Recent episodes of chest discomfort described as a tightness and severe pain across the chest. Previous stress test in 2018 was normal, recent EKG showed subtle changes but could be due to breast tissue abnormality. The symptoms chest pain is concerning, this patient does have intermediate risk for coronary artery disease and at this time I would like to pursue an ischemic evaluation in this patient.  Shared decision a coronary CTA at this time is appropriate.  I have discussed with the patient about the testing.  The patient has no IV contrast allergy and is agreeable to proceed with this test. -Schedule follow-up appointment in 12 weeks to discuss results and potential surgical clearance for knee surgery.  Prediabetes Hemoglobin A1c in July was 6.2, indicating prediabetes. -Continue monitoring blood glucose levels.  Hypertension Currently managed with medication. -Continue current medication regimen.  Dyslipidemia Currently managed with medication. -Continue current medication regimen.  Parkinson's Disease No new symptoms reported. -Continue current management plan.  Arthritis Patient reports difficulty walking due to bad back and knee. Knee surgery planned. -Continue current management plan.  Anxiety No new symptoms reported. -Continue current management plan.  The patient is in agreement with the above plan. The patient left the office in stable condition.  The patient will follow up in   Medication Adjustments/Labs and Tests Ordered: Current medicines are reviewed at length with the patient today.  Concerns regarding medicines are outlined above.  Orders Placed This Encounter  Procedures   CT CORONARY MORPH W/CTA COR W/SCORE W/CA W/CM &/OR WO/CM   Basic metabolic panel   No orders of the defined types were placed in this encounter.   Patient Instructions  Medication Instructions:  NO CHANGES  *If you need a refill on your  cardiac medications before your next appointment, please call your pharmacy*   Lab Work: Non-Fasting BMET today -- for CT test  If you have labs (blood work) drawn today and your tests are completely normal, you will receive your results only by: MyChart Message (if you have MyChart) OR A paper copy in the mail If you have any lab test that is abnormal or we need to change your treatment, we will call you to review the results.   Testing/Procedures: Coronary CT test at [facility] get a call to schedule this test once approved with insurance.    Follow-Up: At [facility], you and your health needs are our priority.  As part of our continuing mission to provide you with exceptional heart care, we have created designated Provider Care Teams.  These Care Teams include your primary Cardiologist (physician) and Advanced Practice Providers (APPs -  Physician Assistants and Nurse Practitioners) who all work together to provide you with the care you need, when you need it.  We recommend signing up for the patient portal called "MyChart".  Sign up information is provided on this After Visit Summary.  MyChart is used to connect with patients for Virtual Visits (Telemedicine).  Patients are able to view lab/test results, encounter notes, upcoming appointments, etc.  Non-urgent messages can be sent to your provider as well.   To learn more about what you can do with MyChart, go to [email or web address].    Your next appointment:    12 weeks with Dr. Servando Salina  Other Instructions    Your cardiac CT will be scheduled at one of the below locations:   [facility] [address] [place], [place] [address] [phone]  OR  [facility] [address] [place], [place] [address] [phone]  OR   [facility] [address] [place], [place] [address] [phone]  If scheduled at [facility], please arrive at the [facility] and Children's Entrance (Entrance C2) of [facility] 30 minutes prior to test start time. You can use the FREE valet parking offered at entrance C (encouraged to control the heart rate for the test)  Proceed to the [facility] Radiology Department (first floor) to check-in and test prep.  All radiology patients and guests should use entrance C2 at [facility], accessed from [facility], even though the hospital's physical address listed is [address]    If scheduled at [facility] or [facility], please arrive 15 mins early for check-in and test prep.  There is spacious parking and easy access to the radiology department from the [facility] Heart and Vascular entrance. Please enter here and check-in with the desk attendant.   Please follow these instructions carefully (unless otherwise directed):  An IV will be required for this test and Nitroglycerin will be given.  Hold all erectile dysfunction medications at least 3 days (72 hrs) prior to test. (Ie viagra, cialis, sildenafil, tadalafil, etc)   On the Night Before the Test: Be sure to Drink plenty of water. Do not consume any caffeinated/decaffeinated beverages or chocolate 12 hours prior to your test. Do not take any antihistamines 12 hours prior to your test.  On the Day of the Test: Drink plenty of water until 1 hour prior to the test. Do not eat any food 1 hour prior to test. You may take your regular medications prior to the test.  Take metoprolol (Lopressor) two hours prior to test. If you take Furosemide/Hydrochlorothiazide/Spironolactone, please HOLD on the morning of the test. FEMALES- please wear underwire-free bra if available, avoid dresses & tight clothing      After the Test: Drink plenty of water. After receiving IV contrast, you may experience a mild flushed feeling. This is normal. On  occasion, you may experience a mild rash up to 24 hours after the test. This is not dangerous. If this occurs, you can take Benadryl 25 mg and increase your fluid intake. If you experience trouble breathing, this can be serious. If it is severe call 911 IMMEDIATELY. If it is mild, please call our office. If you take any of these medications: Glipizide/Metformin, Avandament, Glucavance, please do not take 48 hours after completing test unless otherwise instructed.  We will call to schedule your test 2-4 weeks out understanding that some insurance companies will need an authorization prior to the service being performed.   For more information and frequently asked questions, please visit our website : [email or web address]  For non-scheduling related questions, please contact the cardiac imaging nurse navigator should you have any questions/concerns: Cardiac Imaging Nurse Navigators Direct Office Dial: [phone]   For scheduling needs, including cancellations and rescheduling, please call [place], [phone].     Adopting a Healthy Lifestyle.  Know what a healthy weight is for you (roughly BMI <25) and aim to maintain this   Aim for 7+ servings of fruits and vegetables daily   65-80+ fluid ounces of water or unsweet tea for healthy kidneys   Limit to max 1 drink of alcohol per day; avoid smoking/tobacco   Limit animal fats in diet for cholesterol and heart health - choose grass fed whenever available  Avoid highly processed foods, and foods high in saturated/trans fats   Aim for low stress - take time to unwind and care for your mental health   Aim for 150 min of moderate intensity exercise weekly for heart health, and weights twice weekly for bone health   Aim for 7-9 hours of sleep daily   When it comes to diets, agreement about the perfect plan isnt easy to find, even among the experts. Experts at the [facility] [employer] developed an idea known as  the Healthy Eating Plate. Just imagine a plate divided into logical, healthy portions.   The emphasis is on diet quality:   Load up on vegetables and fruits - one-half of your plate: Aim for color and variety, and remember that potatoes dont count.   Go for whole grains - one-quarter of your plate: Whole wheat, barley, wheat berries, quinoa, oats, brown rice, and foods made with them. If you want pasta, go with whole wheat pasta.   Protein power - one-quarter of your plate: Fish, chicken, beans, and nuts are all healthy, versatile protein sources. Limit red meat.   The diet, however, does go beyond the plate, offering a few other suggestions.   Use healthy plant oils, such as olive, canola, soy, corn, sunflower and peanut. Check the labels, and avoid partially hydrogenated oil, which have unhealthy trans fats.   If youre thirsty, drink water. Coffee and tea are good in moderation, but skip sugary drinks and limit milk and dairy products to one or two daily servings.   The type of carbohydrate in the diet is more important than the amount. Some sources of carbohydrates, such as vegetables, fruits, whole grains, and beans-are healthier than others.   Finally, stay active  Signed, Thomasene Ripple, DO  07/17/2023 1:08 PM    [facility]

## 2023-07-17 NOTE — Patient Instructions (Signed)
Medication Instructions:  NO CHANGES  *If you need a refill on your cardiac medications before your next appointment, please call your pharmacy*   Lab Work: Non-Fasting BMET today -- for CT test  If you have labs (blood work) drawn today and your tests are completely normal, you will receive your results only by: MyChart Message (if you have MyChart) OR A paper copy in the mail If you have any lab test that is abnormal or we need to change your treatment, we will call you to review the results.   Testing/Procedures: Coronary CT test at [facility] get a call to schedule this test once approved with insurance.    Follow-Up: At [facility], you and your health needs are our priority.  As part of our continuing mission to provide you with exceptional heart care, we have created designated Provider Care Teams.  These Care Teams include your primary Cardiologist (physician) and Advanced Practice Providers (APPs -  Physician Assistants and Nurse Practitioners) who all work together to provide you with the care you need, when you need it.  We recommend signing up for the patient portal called "MyChart".  Sign up information is provided on this After Visit Summary.  MyChart is used to connect with patients for Virtual Visits (Telemedicine).  Patients are able to view lab/test results, encounter notes, upcoming appointments, etc.  Non-urgent messages can be sent to your provider as well.   To learn more about what you can do with MyChart, go to [email or web address].    Your next appointment:    12 weeks with Dr. Servando Salina  Other Instructions    Your cardiac CT will be scheduled at one of the below locations:   [facility] [address] [place], [place] [address] [phone]  OR  [facility] [address] [place], [place] [address] [phone]  OR   [facility] [address] [place], [place] [address] [phone]  If scheduled at [facility], please arrive at the [facility] and Children's Entrance (Entrance C2) of [facility] 30 minutes prior to test start time. You can use the FREE valet parking offered at entrance C (encouraged to control the heart rate for the test)  Proceed to the [facility] Radiology Department (first floor) to check-in and test prep.  All radiology patients and guests should use entrance C2 at [facility], accessed from [facility], even though the hospital's physical address listed is [address]    If scheduled at [facility] or [facility], please arrive 15 mins early for check-in and test prep.  There is spacious parking and easy access to the radiology department from the [facility] Heart and Vascular entrance. Please enter here and check-in with the desk attendant.   Please follow these instructions carefully (unless otherwise directed):  An IV will be required for this test and Nitroglycerin will be given.  Hold all erectile dysfunction medications at least 3 days (72 hrs) prior to test. (Ie viagra, cialis, sildenafil, tadalafil, etc)   On the Night Before the Test: Be sure to Drink plenty of water. Do not consume any caffeinated/decaffeinated beverages or chocolate 12 hours prior to your test. Do not take any antihistamines 12 hours prior to your test.  On the Day of the Test: Drink plenty of water until 1 hour prior to the test. Do not eat any food 1 hour  prior to test. You may take your regular medications prior to the test.  Take metoprolol (Lopressor) two hours prior to test. If you take Furosemide/Hydrochlorothiazide/Spironolactone, please HOLD on the morning of the test. FEMALES- please wear underwire-free bra if available, avoid dresses & tight clothing      After the Test: Drink plenty of water. After receiving IV  contrast, you may experience a mild flushed feeling. This is normal. On occasion, you may experience a mild rash up to 24 hours after the test. This is not dangerous. If this occurs, you can take Benadryl 25 mg and increase your fluid intake. If you experience trouble breathing, this can be serious. If it is severe call 911 IMMEDIATELY. If it is mild, please call our office. If you take any of these medications: Glipizide/Metformin, Avandament, Glucavance, please do not take 48 hours after completing test unless otherwise instructed.  We will call to schedule your test 2-4 weeks out understanding that some insurance companies will need an authorization prior to the service being performed.   For more information and frequently asked questions, please visit our website : [email or web address]  For non-scheduling related questions, please contact the cardiac imaging nurse navigator should you have any questions/concerns: Cardiac Imaging Nurse Navigators Direct Office Dial: [phone]   For scheduling needs, including cancellations and rescheduling, please call [place], [phone].

## 2023-07-18 LAB — BASIC METABOLIC PANEL
BUN/Creatinine Ratio: 21 (ref 12–28)
BUN: 27 mg/dL (ref 8–27)
CO2: 26 mmol/L (ref 20–29)
Calcium: 9.6 mg/dL (ref 8.7–10.3)
Chloride: 100 mmol/L (ref 96–106)
Creatinine, Ser: 1.26 mg/dL — ABNORMAL HIGH (ref 0.57–1.00)
Glucose: 98 mg/dL (ref 70–99)
Potassium: 4.7 mmol/L (ref 3.5–5.2)
Sodium: 142 mmol/L (ref 134–144)
eGFR: 45 mL/min/{1.73_m2} — ABNORMAL LOW (ref >59)

## 2023-07-24 ENCOUNTER — Other Ambulatory Visit (INDEPENDENT_AMBULATORY_CARE_PROVIDER_SITE_OTHER): Payer: Medicare Other

## 2023-07-24 ENCOUNTER — Ambulatory Visit: Payer: Medicare Other | Admitting: Orthopedic Surgery

## 2023-07-24 DIAGNOSIS — M5416 Radiculopathy, lumbar region: Secondary | ICD-10-CM | POA: Diagnosis not present

## 2023-07-24 NOTE — Progress Notes (Signed)
Orthopedic Spine Surgery Office Note   Assessment: Patient is a 72 y.o. female with chronic right sided radicular pain from the right hip along the lateral thigh     Plan: -Patient has tried PT, activity modification, over-the-counter medications, steroid injections, pain management -We went over the medications again that she can take.  I told her that since Lyrica has been helping, she should continue to take that.  She also finds diclofenac helpful which I previously prescribed.  I told her that she can take 50 mg twice daily but she should not be taking ibuprofen without medication.  She can continue to use Tylenol in addition to those medications -I told her we can reevaluate things after her knee replacement revision surgery -Patient should return after her knee replacement to check in on her symptoms,  x-rays at next visit: scoliosis     Patient expressed understanding of the plan and all questions were answered to the patient's satisfaction.    ___________________________________________________________________________   History: Patient is a 72 y.o. female who has been previously seen in the office for symptoms for low back pain that radiates into her right lateral thigh.  Patient is still having significant low back pain that radiates into the right lateral thigh.  She feels it sometimes radiates into the right lateral leg as well.  She is not having any pain radiating into the left lower extremity.  She feels her symptoms are similar to the last time that I saw her.  She states that the pain can be severe at times and she has difficulty walking more than just around the house because of the pain.  She does notice that the pain gets better if she sits down.  After our last visit, she got an SI joint injection which did provide her with some temporary relief of her lower back pain.  She said it lasted about 2 to 3 weeks.  It did not help with the radiating right leg pain.  She was scheduled  to undergo right knee surgery with Dr. Charlann Boxer but that was canceled due to an infected molar and then chest pain.  She is now undergoing workup for cardiac clearance but is still anticipating on undergoing right knee surgery in the near future.  She has been finding Lyrica helpful for her pain.   Previous treatments: PT, activity modification, over-the-counter medications, steroid injections, pain management    Physical Exam:   General: no acute distress, appears stated age Neurologic: alert, answering questions appropriately, following commands Respiratory: unlabored breathing on room air, symmetric chest rise Psychiatric: appropriate affect, normal cadence to speech     MSK (spine):   -Strength exam                                                   Left                  Right EHL                              5/5                  5/5 TA  5/5                  5/5 GSC                             5/5                  5/5 Knee extension            5/5                  5/5 Hip flexion                    5/5                  5/5   -Sensory exam                           Sensation intact to light touch in L3-S1 nerve distributions of bilateral lower extremities   -Straight leg raise: Negative bilaterally -Femoral nerve stretch test: Negative bilaterally -Clonus: no beats bilaterally    Imaging: XRs of the lumbar spine from 07/24/2023 were independently reviewed and interpreted, showing interbody devices at L3/4 and L4/5 and pedicle screws at L3, L4, L5. There is no lucency seen around the screws or interbody devices. The L3 screws appear to violate the endplate and go into the L2/3 disc space. Lumbar scoliosis with apex at L2. No fracture or dislocation seen. Disc height loss with anterior osteophyte formation at L2/3 and L5/S1.    CT of the lumbar spine from 04/22/2022 was previously independently reviewed and interpreted, showing vacuum disc at L5/S1 and  L2/3. On the coronal, it appears the inferior facet of L2 is not present and there is translation at the level of the facet joint. Subtle lucency around the L3 screws. More obvious lucency around the L5 screws.    MRI of the lumbar spine from 05/30/2022 was previously independently reviewed and interpreted, showing L2/3 lateral recess and central stenosis. Foraminal stenosis at L4/5 and L5/S1. Central disc herniation at L5/S1.      Patient name: Kristin Gilmore Patient MRN: [id number] Date of visit: 07/24/23

## 2023-07-30 ENCOUNTER — Telehealth (HOSPITAL_COMMUNITY): Payer: Self-pay | Admitting: *Deleted

## 2023-07-30 NOTE — Telephone Encounter (Signed)
Reaching out to patient to offer assistance regarding upcoming cardiac imaging study; pt verbalizes understanding of appt date/time, parking situation and where to check in, pre-test NPO status and medications ordered, and verified current allergies; name and call back number provided for further questions should they arise Hayley Sharpe RN Navigator Cardiac Imaging [facility] [phone] office [phone] cell  

## 2023-07-31 ENCOUNTER — Ambulatory Visit (HOSPITAL_COMMUNITY)
Admission: RE | Admit: 2023-07-31 | Discharge: 2023-07-31 | Disposition: A | Discharge status: Home / Self Care | Payer: Medicare Other | Source: Ambulatory Visit | Attending: Cardiology | Admitting: Cardiology

## 2023-07-31 DIAGNOSIS — I700 Atherosclerosis of aorta: Secondary | ICD-10-CM | POA: Diagnosis not present

## 2023-07-31 DIAGNOSIS — I3481 Nonrheumatic mitral (valve) annulus calcification: Secondary | ICD-10-CM | POA: Insufficient documentation

## 2023-07-31 DIAGNOSIS — I2510 Atherosclerotic heart disease of native coronary artery without angina pectoris: Secondary | ICD-10-CM | POA: Insufficient documentation

## 2023-07-31 DIAGNOSIS — R079 Chest pain, unspecified: Secondary | ICD-10-CM | POA: Insufficient documentation

## 2023-07-31 MED ORDER — NITROGLYCERIN 0.4 MG SL SUBL
0.8000 mg | SUBLINGUAL_TABLET | Freq: Once | SUBLINGUAL | Status: AC
  Administered 2023-07-31: 0.8 mg via SUBLINGUAL

## 2023-07-31 MED ORDER — IOHEXOL 350 MG/ML SOLN
100.0000 mL | Freq: Once | INTRAVENOUS | Status: AC | PRN
  Administered 2023-07-31: 100 mL via INTRAVENOUS

## 2023-07-31 MED ORDER — DILTIAZEM HCL 25 MG/5ML IV SOLN: 10.0000 mg | INTRAVENOUS | Status: DC | PRN

## 2023-07-31 MED ORDER — METOPROLOL TARTRATE 5 MG/5ML IV SOLN: 10.0000 mg | Freq: Once | INTRAVENOUS | Status: DC | PRN

## 2023-07-31 MED ORDER — NITROGLYCERIN 0.4 MG SL SUBL
SUBLINGUAL_TABLET | SUBLINGUAL | Status: AC
  Filled 2023-07-31: qty 2

## 2023-08-01 DIAGNOSIS — E119 Type 2 diabetes mellitus without complications: Secondary | ICD-10-CM | POA: Diagnosis not present

## 2023-08-01 DIAGNOSIS — Z23 Encounter for immunization: Secondary | ICD-10-CM | POA: Diagnosis not present

## 2023-08-01 DIAGNOSIS — E785 Hyperlipidemia, unspecified: Secondary | ICD-10-CM | POA: Diagnosis not present

## 2023-08-06 ENCOUNTER — Telehealth: Payer: Self-pay | Admitting: Cardiology

## 2023-08-06 NOTE — Telephone Encounter (Signed)
Pt is wanting to know the results of her CT Scan. Please advise

## 2023-08-06 NOTE — Telephone Encounter (Signed)
Patient is calling for calling for CT results. Please advise.,

## 2023-08-07 NOTE — Telephone Encounter (Signed)
Follow Up:      Patient says she needs to talk to a nurse again. She said she have some more questions, concerning her test results.

## 2023-08-07 NOTE — Telephone Encounter (Signed)
Spoke with patient and she would like a call back form Nurse [employer]

## 2023-08-12 NOTE — Telephone Encounter (Signed)
Left message for patient to return the call.

## 2023-08-12 NOTE — Telephone Encounter (Signed)
Spoke with pt she wants to know what should she do when she has these pains? Is it okay to take Excedrin? Pt has several questions. Added her to schedule for MyChart visit. Asked pt if she had a blood pressure cuff she states "I have one but I can't do it because my husband died a year agio and it's hard for me to out it on myself."

## 2023-08-13 ENCOUNTER — Other Ambulatory Visit: Payer: Self-pay | Admitting: Orthopedic Surgery

## 2023-08-14 ENCOUNTER — Ambulatory Visit: Payer: Medicare Other | Attending: Cardiology | Admitting: Cardiology

## 2023-08-14 ENCOUNTER — Ambulatory Visit: Payer: Medicare Other | Admitting: Cardiology

## 2023-08-14 ENCOUNTER — Encounter: Payer: Self-pay | Admitting: Cardiology

## 2023-08-14 VITALS — Ht 67.0 in | Wt 185.0 lb

## 2023-08-14 DIAGNOSIS — I10 Essential (primary) hypertension: Secondary | ICD-10-CM | POA: Diagnosis not present

## 2023-08-14 DIAGNOSIS — I2510 Atherosclerotic heart disease of native coronary artery without angina pectoris: Secondary | ICD-10-CM

## 2023-08-14 DIAGNOSIS — E785 Hyperlipidemia, unspecified: Secondary | ICD-10-CM | POA: Diagnosis not present

## 2023-08-14 MED ORDER — NITROGLYCERIN 0.4 MG SL SUBL: 0.4000 mg | SUBLINGUAL_TABLET | SUBLINGUAL | 3 refills | Status: DC | PRN

## 2023-08-14 NOTE — Patient Instructions (Signed)
Medication Instructions:  Your physician has recommended you make the following change in your medication:  AS NEEDED: Nitroglycerin 0.4 mg place 1 tablet under your tongue every 5 minutes (up to three times) as needed for chest pain.  *If you need a refill on your cardiac medications before your next appointment, please call your pharmacy*    Testing/Procedures: None   Follow-Up: At [facility], you and your health needs are our priority.  As part of our continuing mission to provide you with exceptional heart care, we have created designated Provider Care Teams.  These Care Teams include your primary Cardiologist (physician) and Advanced Practice Providers (APPs -  Physician Assistants and Nurse Practitioners) who all work together to provide you with the care you need, when you need it.  Your next appointment:   1 year(s)  Provider:   Thomasene Ripple, DO or an APP

## 2023-08-14 NOTE — Addendum Note (Signed)
Addended by: Reynolds Bowl on: 08/14/2023 11:48 AM   Modules accepted: Orders

## 2023-08-14 NOTE — Progress Notes (Signed)
Virtual Visit via Telephone Note   Because of Kristin Gilmore's co-morbid illnesses, she is at least at moderate risk for complications without adequate follow up.  This format is felt to be most appropriate for this patient at this time.  The patient did not have access to video technology/had technical difficulties with video requiring transitioning to audio format only (telephone).  All issues noted in this document were discussed and addressed.  No physical exam could be performed with this format.  Please refer to the patient's chart for her consent to telehealth for [facility].   Date:  08/14/2023   ID:  Kristin Gilmore, DOB Jun 28, 1951, MRN [id number]  Patient Location: Home Provider Location: Office/Clinic   Virtual Visit via telephone  Note . I connected with the patient today by a telephone enabled telemedicine application and verified that I am speaking with the correct person using two identifiers.  PCP:  Jackelyn Poling, DO  Cardiologist:  Thomasene Ripple, DO  Electrophysiologist:  None   Evaluation Performed:  Follow-Up Visit  Chief Complaint:  " recently had chest pain in my doctor office"  History of Present Illness:    Kristin Gilmore is a 72 y.o. female with minimal coronary artery disease seen on coronary CTA, dyslipidemia, hypertension, prediabetes, Parkinson disease and anxiety.  At her last visit she reported chest pain which was concerning with her risk factors I sent the patient for coronary CTA.  Coronary CTA came back with minimal coronary artery disease.  She tells me that she was doing fine and recently at her doctor office she had same chest discomfort which she described as sudden onset of severe chest pain.  She described it to be a pain that was above her breast tightness.  She notes that she was given nitroglycerin and it resolved.   The patient does not have symptoms concerning for COVID-19 infection (fever, chills, cough, or new  shortness of breath).    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   Pneumonia    Pre-diabetes    "i was told i was pre-diabetic a while ago"   Tremor 04/13/2014   Past Surgical History:  Procedure Laterality Date   CHOLECYSTECTOMY     COLONOSCOPY W/ POLYPECTOMY     LUMBAR LAMINECTOMY  07/2020   3 level decompression - [place], [place]   LUMBAR LAMINECTOMY/DECOMPRESSION MICRODISCECTOMY N/A 12/19/2017   Procedure: LAMINECTOMY LUMBAR TWO- LUMBAR THREE, LUMBAR THREE- LUMBAR FOUR, LUMBAR FOUR- LUMBAR FIVE ;  Surgeon: Lisbeth Renshaw, MD;  Location: [facility] OR;  Service: Neurosurgery;  Laterality: N/A;   NASAL SINUS SURGERY     RADIOLOGY WITH ANESTHESIA N/A 05/30/2022   Procedure: MRI WITH ANESTHESIA LUMBAR SPINE W/WO CONSTRAST;  Surgeon: Radiologist, Medication, MD;  Location: [facility] OR;  Service: Radiology;  Laterality: N/A;   TEE WITHOUT CARDIOVERSION N/A 06/05/2022   Procedure: TRANSESOPHAGEAL ECHOCARDIOGRAM (TEE);  Surgeon: Chrystie Nose, MD;  Location[facility] ENDOSCOPY;  Service: Cardiovascular;  Laterality: N/A;   TONSILLECTOMY     TOTAL KNEE ARTHROPLASTY Right 06/26/2021   Procedure: RIGHT TOTAL KNEE ARTHROPLASTY;  Surgeon: Tarry Kos,  MD;  Location: [facility] OR;  Service: Orthopedics;  Laterality: Right;   WISDOM TOOTH EXTRACTION       Current Meds  Medication Sig   acetaminophen (TYLENOL) 500 MG tablet Take 1,000 mg by mouth every 6 (six) hours as needed for mild pain or moderate pain.   amitriptyline (ELAVIL) 10 MG tablet Take 1 tablet (10 mg total) by mouth at bedtime as needed for sleep. (Patient taking differently: Take 25 mg by mouth at bedtime.)    amLODipine (NORVASC) 5 MG tablet Take 5 mg by mouth 2 (two) times daily.   amoxicillin (AMOXIL) 500 MG capsule Take 500 mg by mouth 3 (three) times daily.   aspirin EC 81 MG tablet Take 81 mg by mouth daily. Swallow whole.   bisacodyl (DULCOLAX) 5 MG EC tablet Take 5-15 mg by mouth at bedtime as needed (constipation).   BLACK COHOSH PO Take 1 tablet by mouth daily.   Calcium Citrate-Vitamin D (CALCIUM CITRATE + D PO) Take 1-2 capsules by mouth See admin instructions. Calcium 200 mg, vitamin D3 2.5 mcg - 100 units - take 1 tablet by mouth with breakfast and 2 tablets with supper   carbidopa-levodopa (SINEMET IR) 25-100 MG tablet Take 1 tablet by mouth 4 (four) times daily.   carvedilol (COREG) 12.5 MG tablet Take 12.5 mg by mouth 2 (two) times daily.   Cholecalciferol (VITAMIN D3) 50 MCG (2000 UT) capsule Take 2,000 Units by mouth daily.   Coenzyme Q10 (COQ10) 100 MG CAPS Take 100 mg by mouth daily with supper.   diazepam (VALIUM) 5 MG tablet Take 1 tablet (5 mg total) by mouth once as needed for up to 1 dose (prior to nerve study).   divalproex (DEPAKOTE ER) 250 MG 24 hr tablet Take 3 tablets (750 mg total) by mouth daily. (Patient taking differently: Take 125-375 mg by mouth See admin instructions. Take 125 mg in morning and 375 mg at bedtime)   docusate sodium (COLACE) 100 MG capsule Take 200-300 mg by mouth at bedtime.   Homeopathic Products [facility] RELIEF EX) Apply 1 application  topically daily as needed (Knee pain).   hyoscyamine (ANASPAZ) 0.125 MG TBDP disintergrating tablet Take 0.125 mg by mouth daily as needed (Stomach pain).   ibuprofen (ADVIL) 200 MG tablet Take 400 mg by mouth daily as needed for headache.   loperamide (IMODIUM) 2 MG capsule Take 2 mg by mouth 4 (four) times daily as needed for diarrhea or loose stools.   MAGNESIUM CITRATE PO Take 150 mg by mouth at bedtime.   Magnesium Oxide 500 MG TABS Take 500 mg by mouth at bedtime.   Menthol, Topical Analgesic, (BIOFREEZE)  4 % GEL Apply 1 application  topically 4 (four) times daily as needed (pain).   Omega-3 Fatty Acids (FISH OIL) 500 MG CAPS Take 500 mg by mouth 2 (two) times daily. Super   OVER THE COUNTER MEDICATION Take 50 mg by mouth 2 (two) times daily. PETADOLEX - Health brain blood vessel relaxation (butterburr root extract)   OVER THE COUNTER MEDICATION Apply 1 application  topically daily as needed (Pain). Bengay   pregabalin (LYRICA) 75 MG capsule TAKE 1 CAPSULE BY MOUTH 3 TIMES DAILY.   QUEtiapine (SEROQUEL) 300 MG tablet Take 0.5 tablets (150 mg total) by mouth at bedtime as needed. (Patient taking differently: Take 75 mg by mouth at bedtime.)   Red Yeast Rice 600 MG CAPS Take 1,200 mg by mouth 2 (two) times daily with a meal.   rosuvastatin (CRESTOR)  20 MG tablet Take 20 mg by mouth every Monday, Wednesday, and Friday. Dinner   telmisartan (MICARDIS) 80 MG tablet Take 80 mg by mouth at bedtime.   Turmeric 500 MG CAPS Take 500 mg by mouth 2 (two) times daily with a meal. extract     Allergies:   Levofloxacin, Amoxicillin, Clarithromycin, and Atropine   Social History   Tobacco Use   Smoking status: Never   Smokeless tobacco: Never  Vaping Use   Vaping status: Never Used  Substance Use Topics   Alcohol use: Not Currently   Drug use: No     Family Hx: The patient's family history includes Cancer in her father; Migraines in her mother.  ROS:   Please see the history of present illness.    Atypical chest pain All other systems reviewed and are negative.   Prior CV studies:   The following studies were reviewed today:  Reviewed cardiac CTA  Labs/Other Tests and Data Reviewed:    EKG:    Recent Labs: 06/16/2023: ALT 9; Hemoglobin 13.0; Platelets 195 07/17/2023: BUN 27; Creatinine, Ser 1.26; Potassium 4.7; Sodium 142   Recent Lipid Panel Lab Results  Component Value Date/Time   CHOL  04/07/2008 06:30 AM    166        ATP III CLASSIFICATION:  <200     mg/dL   Desirable   191-478  mg/dL   Borderline High  >=295    mg/dL   High   TRIG 86 62/13/0865 06:30 AM   HDL 64 04/07/2008 06:30 AM   CHOLHDL 2.6 04/07/2008 06:30 AM   LDLCALC  04/07/2008 06:30 AM    85        Total Cholesterol/HDL:CHD Risk Coronary Heart Disease Risk Table                     Men   Women  1/2 Average Risk   3.4   3.3    Wt Readings from Last 3 Encounters:  08/14/23 185 lb (83.9 kg)  07/17/23 191 lb 11.2 oz (87 kg)  07/14/23 185 lb (83.9 kg)     Objective:    Vital Signs:  Ht 5\' 7"  (1.702 m)   Wt 185 lb (83.9 kg)   BMI 28.98 kg/m     ASSESSMENT & PLAN:    Minimal CAD with atypical chest pain-I do not believe this is anginal pain, she does have minimal coronary artery disease and this does not sound as this is microvascular angina as well.  This is sporadic short-lived.  Since she improved with nitroglycerin we will give her as needed sublingual nitroglycerin hopefully will monitor the patient closely if she has to use this medication often. Blood pressure is acceptable, continue with current antihypertensive regimen. Dyslipidemia  COVID-19 Education: The signs and symptoms of COVID-19 were discussed with the patient and how to seek care for testing (follow up with PCP or arrange E-visit).  The importance of social distancing was discussed today.  Time:   Today, I have spent 10  minutes with the patient with telehealth technology discussing the above problems.     Medication Adjustments/Labs and Tests Ordered: Current medicines are reviewed at length with the patient today.  Concerns regarding medicines are outlined above.   Tests Ordered: No orders of the defined types were placed in this encounter.   Medication Changes: No orders of the defined types were placed in this encounter.   Follow Up:  In Person in 1 year(s)  Osvaldo Shipper, DO  08/14/2023 11:20 AM    [facility]

## 2023-08-18 ENCOUNTER — Ambulatory Visit: Payer: Medicare Other | Admitting: Podiatry

## 2023-08-19 DIAGNOSIS — E875 Hyperkalemia: Secondary | ICD-10-CM | POA: Diagnosis not present

## 2023-08-21 ENCOUNTER — Ambulatory Visit: Payer: Medicare Other | Admitting: Podiatry

## 2023-08-22 ENCOUNTER — Other Ambulatory Visit: Payer: Self-pay | Admitting: Psychiatry

## 2023-08-22 DIAGNOSIS — F3110 Bipolar disorder, current episode manic without psychotic features, unspecified: Secondary | ICD-10-CM

## 2023-08-22 DIAGNOSIS — Z20822 Contact with and (suspected) exposure to covid-19: Secondary | ICD-10-CM | POA: Diagnosis not present

## 2023-08-22 DIAGNOSIS — Z6833 Body mass index (BMI) 33.0-33.9, adult: Secondary | ICD-10-CM | POA: Diagnosis not present

## 2023-08-22 DIAGNOSIS — R051 Acute cough: Secondary | ICD-10-CM | POA: Diagnosis not present

## 2023-09-08 ENCOUNTER — Other Ambulatory Visit: Payer: Self-pay | Admitting: Psychiatry

## 2023-09-08 DIAGNOSIS — F3171 Bipolar disorder, in partial remission, most recent episode hypomanic: Secondary | ICD-10-CM

## 2023-09-08 NOTE — Telephone Encounter (Signed)
Call to RS

## 2023-09-13 ENCOUNTER — Other Ambulatory Visit: Payer: Self-pay | Admitting: Psychiatry

## 2023-09-13 DIAGNOSIS — F3110 Bipolar disorder, current episode manic without psychotic features, unspecified: Secondary | ICD-10-CM

## 2023-09-15 NOTE — Telephone Encounter (Signed)
Please schedule an appt

## 2023-09-15 NOTE — Telephone Encounter (Signed)
Sched RS to get RF

## 2023-09-17 ENCOUNTER — Ambulatory Visit: Payer: Medicare Other | Admitting: Podiatry

## 2023-09-17 ENCOUNTER — Encounter: Payer: Self-pay | Admitting: Podiatry

## 2023-09-17 ENCOUNTER — Other Ambulatory Visit: Payer: Self-pay | Admitting: Psychiatry

## 2023-09-17 DIAGNOSIS — F3110 Bipolar disorder, current episode manic without psychotic features, unspecified: Secondary | ICD-10-CM

## 2023-09-17 DIAGNOSIS — M79674 Pain in right toe(s): Secondary | ICD-10-CM

## 2023-09-17 DIAGNOSIS — B351 Tinea unguium: Secondary | ICD-10-CM

## 2023-09-17 DIAGNOSIS — M79675 Pain in left toe(s): Secondary | ICD-10-CM | POA: Diagnosis not present

## 2023-09-17 DIAGNOSIS — E1141 Type 2 diabetes mellitus with diabetic mononeuropathy: Secondary | ICD-10-CM | POA: Diagnosis not present

## 2023-09-17 NOTE — Progress Notes (Signed)
This patient returns to my office for at risk foot care.  This patient requires this care by a professional since this patient will be at risk due to having diabetes and Parkinsons. This patient is unable to cut nails herself since the patient cannot reach hernails.These nails are painful walking and wearing shoes.  This patient presents for at risk foot care today.  General Appearance  Alert, conversant and in no acute stress.  Vascular  Dorsalis pedis and posterior tibial  pulses are palpable  bilaterally.  Capillary return is within normal limits  bilaterally. Temperature is within normal limits  bilaterally.  Neurologic  Senn-Weinstein monofilament wire test within normal limits  bilaterally. Muscle power within normal limits bilaterally.  Nails Thick disfigured discolored nails with subungual debris  from hallux to fifth toes bilaterally. No evidence of bacterial infection or drainage bilaterally.  Orthopedic  No limitations of motion  feet .  No crepitus or effusions noted.  No bony pathology or digital deformities noted.  Skin  normotropic skin with no porokeratosis noted bilaterally.  No signs of infections or ulcers noted.     Onychomycosis  Pain in right toes  Pain in left toes  Dermatitis.  Consent was obtained for treatment procedures.   Mechanical debridement of nails 1-5  bilaterally performed with a nail nipper.  Filed with dremel without incident.    Return office visit    12 weeks.                 Told patient to return for periodic foot care and evaluation due to potential at risk complications.   Helane Gunther DPM

## 2023-10-02 ENCOUNTER — Other Ambulatory Visit: Payer: Self-pay | Admitting: Psychiatry

## 2023-10-02 DIAGNOSIS — F3171 Bipolar disorder, in partial remission, most recent episode hypomanic: Secondary | ICD-10-CM

## 2023-10-02 NOTE — Telephone Encounter (Signed)
 Raven pointed out pt has had multiple no shows/cancellations. She was last seen 12/2022 and advised returning in a few weeks, she has no scheduled apt. Looks like we continue doing her refills. Please review.

## 2023-10-03 DIAGNOSIS — Z471 Aftercare following joint replacement surgery: Secondary | ICD-10-CM | POA: Diagnosis not present

## 2023-10-03 DIAGNOSIS — Z96651 Presence of right artificial knee joint: Secondary | ICD-10-CM | POA: Diagnosis not present

## 2023-10-06 DIAGNOSIS — B356 Tinea cruris: Secondary | ICD-10-CM | POA: Diagnosis not present

## 2023-10-10 ENCOUNTER — Ambulatory Visit: Payer: Medicare Other | Admitting: Cardiology

## 2023-10-24 DIAGNOSIS — M25561 Pain in right knee: Secondary | ICD-10-CM | POA: Diagnosis not present

## 2023-10-24 DIAGNOSIS — M25661 Stiffness of right knee, not elsewhere classified: Secondary | ICD-10-CM | POA: Diagnosis not present

## 2023-10-24 DIAGNOSIS — R2689 Other abnormalities of gait and mobility: Secondary | ICD-10-CM | POA: Diagnosis not present

## 2023-10-24 DIAGNOSIS — M6281 Muscle weakness (generalized): Secondary | ICD-10-CM | POA: Diagnosis not present

## 2023-11-04 ENCOUNTER — Telehealth: Payer: Self-pay | Admitting: Psychiatry

## 2023-11-04 DIAGNOSIS — F3171 Bipolar disorder, in partial remission, most recent episode hypomanic: Secondary | ICD-10-CM

## 2023-11-04 NOTE — Telephone Encounter (Signed)
12/31/22 lv- multiple cx/ns appt. Please schd. Pt an appt.

## 2023-11-05 DIAGNOSIS — M51361 Other intervertebral disc degeneration, lumbar region with lower extremity pain only: Secondary | ICD-10-CM | POA: Diagnosis not present

## 2023-11-05 DIAGNOSIS — M48061 Spinal stenosis, lumbar region without neurogenic claudication: Secondary | ICD-10-CM | POA: Diagnosis not present

## 2023-11-05 DIAGNOSIS — M069 Rheumatoid arthritis, unspecified: Secondary | ICD-10-CM | POA: Diagnosis not present

## 2023-11-05 DIAGNOSIS — Z79899 Other long term (current) drug therapy: Secondary | ICD-10-CM | POA: Diagnosis not present

## 2023-11-05 DIAGNOSIS — I10 Essential (primary) hypertension: Secondary | ICD-10-CM | POA: Diagnosis not present

## 2023-11-05 DIAGNOSIS — M47816 Spondylosis without myelopathy or radiculopathy, lumbar region: Secondary | ICD-10-CM | POA: Diagnosis not present

## 2023-11-05 DIAGNOSIS — T8484XD Pain due to internal orthopedic prosthetic devices, implants and grafts, subsequent encounter: Secondary | ICD-10-CM | POA: Diagnosis not present

## 2023-11-05 DIAGNOSIS — M810 Age-related osteoporosis without current pathological fracture: Secondary | ICD-10-CM | POA: Diagnosis not present

## 2023-11-05 DIAGNOSIS — Z96651 Presence of right artificial knee joint: Secondary | ICD-10-CM | POA: Diagnosis not present

## 2023-11-05 DIAGNOSIS — Z9089 Acquired absence of other organs: Secondary | ICD-10-CM | POA: Diagnosis not present

## 2023-11-05 DIAGNOSIS — F329 Major depressive disorder, single episode, unspecified: Secondary | ICD-10-CM | POA: Diagnosis not present

## 2023-11-07 DIAGNOSIS — M810 Age-related osteoporosis without current pathological fracture: Secondary | ICD-10-CM | POA: Diagnosis not present

## 2023-11-07 DIAGNOSIS — T8484XD Pain due to internal orthopedic prosthetic devices, implants and grafts, subsequent encounter: Secondary | ICD-10-CM | POA: Diagnosis not present

## 2023-11-07 DIAGNOSIS — M47816 Spondylosis without myelopathy or radiculopathy, lumbar region: Secondary | ICD-10-CM | POA: Diagnosis not present

## 2023-11-07 DIAGNOSIS — Z96651 Presence of right artificial knee joint: Secondary | ICD-10-CM | POA: Diagnosis not present

## 2023-11-07 DIAGNOSIS — Z79899 Other long term (current) drug therapy: Secondary | ICD-10-CM | POA: Diagnosis not present

## 2023-11-07 DIAGNOSIS — I10 Essential (primary) hypertension: Secondary | ICD-10-CM | POA: Diagnosis not present

## 2023-11-07 DIAGNOSIS — M069 Rheumatoid arthritis, unspecified: Secondary | ICD-10-CM | POA: Diagnosis not present

## 2023-11-07 DIAGNOSIS — M48061 Spinal stenosis, lumbar region without neurogenic claudication: Secondary | ICD-10-CM | POA: Diagnosis not present

## 2023-11-07 DIAGNOSIS — Z9089 Acquired absence of other organs: Secondary | ICD-10-CM | POA: Diagnosis not present

## 2023-11-07 DIAGNOSIS — F329 Major depressive disorder, single episode, unspecified: Secondary | ICD-10-CM | POA: Diagnosis not present

## 2023-11-07 DIAGNOSIS — M51361 Other intervertebral disc degeneration, lumbar region with lower extremity pain only: Secondary | ICD-10-CM | POA: Diagnosis not present

## 2023-11-10 DIAGNOSIS — I10 Essential (primary) hypertension: Secondary | ICD-10-CM | POA: Diagnosis not present

## 2023-11-10 DIAGNOSIS — M48061 Spinal stenosis, lumbar region without neurogenic claudication: Secondary | ICD-10-CM | POA: Diagnosis not present

## 2023-11-10 DIAGNOSIS — M47816 Spondylosis without myelopathy or radiculopathy, lumbar region: Secondary | ICD-10-CM | POA: Diagnosis not present

## 2023-11-10 DIAGNOSIS — M810 Age-related osteoporosis without current pathological fracture: Secondary | ICD-10-CM | POA: Diagnosis not present

## 2023-11-10 DIAGNOSIS — M51361 Other intervertebral disc degeneration, lumbar region with lower extremity pain only: Secondary | ICD-10-CM | POA: Diagnosis not present

## 2023-11-10 DIAGNOSIS — Z96651 Presence of right artificial knee joint: Secondary | ICD-10-CM | POA: Diagnosis not present

## 2023-11-10 DIAGNOSIS — F329 Major depressive disorder, single episode, unspecified: Secondary | ICD-10-CM | POA: Diagnosis not present

## 2023-11-10 DIAGNOSIS — T8484XD Pain due to internal orthopedic prosthetic devices, implants and grafts, subsequent encounter: Secondary | ICD-10-CM | POA: Diagnosis not present

## 2023-11-10 DIAGNOSIS — Z9089 Acquired absence of other organs: Secondary | ICD-10-CM | POA: Diagnosis not present

## 2023-11-10 DIAGNOSIS — Z79899 Other long term (current) drug therapy: Secondary | ICD-10-CM | POA: Diagnosis not present

## 2023-11-10 DIAGNOSIS — M069 Rheumatoid arthritis, unspecified: Secondary | ICD-10-CM | POA: Diagnosis not present

## 2023-11-12 DIAGNOSIS — Z96651 Presence of right artificial knee joint: Secondary | ICD-10-CM | POA: Diagnosis not present

## 2023-11-12 DIAGNOSIS — M51361 Other intervertebral disc degeneration, lumbar region with lower extremity pain only: Secondary | ICD-10-CM | POA: Diagnosis not present

## 2023-11-12 DIAGNOSIS — F329 Major depressive disorder, single episode, unspecified: Secondary | ICD-10-CM | POA: Diagnosis not present

## 2023-11-12 DIAGNOSIS — I10 Essential (primary) hypertension: Secondary | ICD-10-CM | POA: Diagnosis not present

## 2023-11-12 DIAGNOSIS — T8484XD Pain due to internal orthopedic prosthetic devices, implants and grafts, subsequent encounter: Secondary | ICD-10-CM | POA: Diagnosis not present

## 2023-11-12 DIAGNOSIS — M810 Age-related osteoporosis without current pathological fracture: Secondary | ICD-10-CM | POA: Diagnosis not present

## 2023-11-12 DIAGNOSIS — M47816 Spondylosis without myelopathy or radiculopathy, lumbar region: Secondary | ICD-10-CM | POA: Diagnosis not present

## 2023-11-12 DIAGNOSIS — M48061 Spinal stenosis, lumbar region without neurogenic claudication: Secondary | ICD-10-CM | POA: Diagnosis not present

## 2023-11-12 DIAGNOSIS — Z79899 Other long term (current) drug therapy: Secondary | ICD-10-CM | POA: Diagnosis not present

## 2023-11-12 DIAGNOSIS — M069 Rheumatoid arthritis, unspecified: Secondary | ICD-10-CM | POA: Diagnosis not present

## 2023-11-12 DIAGNOSIS — Z9089 Acquired absence of other organs: Secondary | ICD-10-CM | POA: Diagnosis not present

## 2023-11-13 ENCOUNTER — Other Ambulatory Visit (INDEPENDENT_AMBULATORY_CARE_PROVIDER_SITE_OTHER): Payer: Medicare Other

## 2023-11-13 ENCOUNTER — Ambulatory Visit: Payer: Medicare Other | Admitting: Orthopedic Surgery

## 2023-11-13 DIAGNOSIS — M5416 Radiculopathy, lumbar region: Secondary | ICD-10-CM

## 2023-11-13 DIAGNOSIS — M4186 Other forms of scoliosis, lumbar region: Secondary | ICD-10-CM

## 2023-11-14 NOTE — Telephone Encounter (Signed)
Pt is not very reliable with appts.

## 2023-11-14 NOTE — Progress Notes (Signed)
Orthopedic Spine Surgery Office Note   Assessment: Patient is a 73 y.o. female with chronic right sided radicular pain from the right hip along the lateral thigh. Also has severe back pain. She does not have any coronal imbalance >4cm and her sagittal imbalance seems to be positional as her T1 pelvis angle is under 20      Plan: -Patient has tried PT, activity modification, over-the-counter medications, steroid injections, pain management -She is interested in surgical management. I am not sure surgery would help with her back pain. She wants the coronal curvature corrected. I told her this would require an osteotomy due to the prior fusion. This would be a lot of work for me to do on my own. She wanted a referral. I referred her to [facility] to see if they think surgery would be of benefit for her -Patient can return to office on an as needed basis     Patient expressed understanding of the plan and all questions were answered to the patient's satisfaction.    ___________________________________________________________________________   History: Patient is a 73 y.o. female who has been previously seen in the office for symptoms for low back pain that radiates into her right lateral thigh. Her symptoms have gotten worse since she was last seen in the office. She says she has difficulty walking more than a few feet because of her back pain. Her leg pain is more tolerable but is still bothersome. She has not developed any new radicular leg pain. Has not noticed any weakness. No bowel or bladder incontinence. No saddle anesthesia.    Previous treatments: PT, activity modification, over-the-counter medications, steroid injections, pain management     Physical Exam:   General: no acute distress, appears stated age Neurologic: alert, answering questions appropriately, following commands Respiratory: unlabored breathing on room air, symmetric chest rise Psychiatric: appropriate affect, normal cadence to  speech     MSK (spine):   -Strength exam                                                   Left                  Right EHL                              5/5                  5/5 TA                                 5/5                  5/5 GSC                             5/5                  5/5 Knee extension            5/5                  5/5 Hip flexion  5/5                  5/5   -Sensory exam                           Sensation intact to light touch in L3-S1 nerve distributions of bilateral lower extremities   -Straight leg raise: Negative bilaterally -Femoral nerve stretch test: Negative bilaterally -Clonus: no beats bilaterally     Imaging: Scoliosis XRs from 11/13/2023 were independently reviewed and interpreted, showing T1 pelvis angle of 18 degrees. SVA of 16cm. PI of 40, LL of 30. Disc height loss at L2/3 and L5/S1. Posterior instrumentation with TLIF from L3-L5. Coronal lumbar curvature that measures 25 degrees with apex to the left. Coronal imbalance on 3.2cm. No fracture or dislocation seen.     MRI of the lumbar spine from 05/30/2022 was previously independently reviewed and interpreted, showing L2/3 lateral recess and central stenosis. Foraminal stenosis at L4/5 and L5/S1. Central disc herniation at L5/S1.      Patient name: Kristin Gilmore Patient MRN: [id number] Date of visit: 11/13/2023

## 2023-11-17 NOTE — Telephone Encounter (Signed)
Kristin Gilmore has made an appt 4/21 in person due to [employer].  She said it will be hard to come in but will get a friend to come with her.  She also is going to have back surgery again but is not sure when.  She did ask that her medications be filled.

## 2023-11-19 ENCOUNTER — Other Ambulatory Visit: Payer: Self-pay

## 2023-11-19 DIAGNOSIS — F3110 Bipolar disorder, current episode manic without psychotic features, unspecified: Secondary | ICD-10-CM

## 2023-11-19 DIAGNOSIS — F3171 Bipolar disorder, in partial remission, most recent episode hypomanic: Secondary | ICD-10-CM

## 2023-11-19 MED ORDER — QUETIAPINE FUMARATE 300 MG PO TABS: 150.0000 mg | ORAL_TABLET | Freq: Every evening | ORAL | 1 refills | Status: DC | PRN

## 2023-11-19 MED ORDER — DIVALPROEX SODIUM ER 250 MG PO TB24: 750.0000 mg | ORAL_TABLET | Freq: Every day | ORAL | 1 refills | Status: DC

## 2023-11-19 NOTE — Telephone Encounter (Signed)
Lvm to rc regarding what meds she needs sent in.

## 2023-11-19 NOTE — Telephone Encounter (Signed)
Sent seroquel and depakote to pharm. Per pt reqst.

## 2023-11-20 ENCOUNTER — Telehealth: Payer: Self-pay | Admitting: Orthopedic Surgery

## 2023-11-20 NOTE — Telephone Encounter (Signed)
IC, spoke to patient, advised her to contact [facility] for the MRI and advised that I will send msg to our xray dept for all spine xrays to to put on a CD and will mail to her as she states it is very difficult for her to come pick up. She voiced understanding.

## 2023-11-20 NOTE — Telephone Encounter (Signed)
Pt called requesting records and imaging be sent with referral from Dr Christell Constant to [facility]. Pt is asking for a call back at [phone].

## 2023-11-20 NOTE — Telephone Encounter (Signed)
Please copy all spine xrays from 2020-present to CD. Patient is requesting for an upcoming appt at [facility]. She cannot pickup. Please let me know when ready. Thank you!

## 2023-11-20 NOTE — Telephone Encounter (Signed)
Xray CD mailed to patient per patients request.

## 2023-11-21 DIAGNOSIS — M51361 Other intervertebral disc degeneration, lumbar region with lower extremity pain only: Secondary | ICD-10-CM | POA: Diagnosis not present

## 2023-11-21 DIAGNOSIS — M47816 Spondylosis without myelopathy or radiculopathy, lumbar region: Secondary | ICD-10-CM | POA: Diagnosis not present

## 2023-11-21 DIAGNOSIS — F329 Major depressive disorder, single episode, unspecified: Secondary | ICD-10-CM | POA: Diagnosis not present

## 2023-11-21 DIAGNOSIS — T8484XD Pain due to internal orthopedic prosthetic devices, implants and grafts, subsequent encounter: Secondary | ICD-10-CM | POA: Diagnosis not present

## 2023-11-21 DIAGNOSIS — Z9089 Acquired absence of other organs: Secondary | ICD-10-CM | POA: Diagnosis not present

## 2023-11-21 DIAGNOSIS — I10 Essential (primary) hypertension: Secondary | ICD-10-CM | POA: Diagnosis not present

## 2023-11-21 DIAGNOSIS — Z96651 Presence of right artificial knee joint: Secondary | ICD-10-CM | POA: Diagnosis not present

## 2023-11-21 DIAGNOSIS — M48061 Spinal stenosis, lumbar region without neurogenic claudication: Secondary | ICD-10-CM | POA: Diagnosis not present

## 2023-11-21 DIAGNOSIS — M810 Age-related osteoporosis without current pathological fracture: Secondary | ICD-10-CM | POA: Diagnosis not present

## 2023-11-21 DIAGNOSIS — Z79899 Other long term (current) drug therapy: Secondary | ICD-10-CM | POA: Diagnosis not present

## 2023-11-21 DIAGNOSIS — M069 Rheumatoid arthritis, unspecified: Secondary | ICD-10-CM | POA: Diagnosis not present

## 2023-11-25 DIAGNOSIS — M47816 Spondylosis without myelopathy or radiculopathy, lumbar region: Secondary | ICD-10-CM | POA: Diagnosis not present

## 2023-11-25 DIAGNOSIS — Z96651 Presence of right artificial knee joint: Secondary | ICD-10-CM | POA: Diagnosis not present

## 2023-11-25 DIAGNOSIS — M48061 Spinal stenosis, lumbar region without neurogenic claudication: Secondary | ICD-10-CM | POA: Diagnosis not present

## 2023-11-25 DIAGNOSIS — Z79899 Other long term (current) drug therapy: Secondary | ICD-10-CM | POA: Diagnosis not present

## 2023-11-25 DIAGNOSIS — M810 Age-related osteoporosis without current pathological fracture: Secondary | ICD-10-CM | POA: Diagnosis not present

## 2023-11-25 DIAGNOSIS — M51361 Other intervertebral disc degeneration, lumbar region with lower extremity pain only: Secondary | ICD-10-CM | POA: Diagnosis not present

## 2023-11-25 DIAGNOSIS — M069 Rheumatoid arthritis, unspecified: Secondary | ICD-10-CM | POA: Diagnosis not present

## 2023-11-25 DIAGNOSIS — F329 Major depressive disorder, single episode, unspecified: Secondary | ICD-10-CM | POA: Diagnosis not present

## 2023-11-25 DIAGNOSIS — Z9089 Acquired absence of other organs: Secondary | ICD-10-CM | POA: Diagnosis not present

## 2023-11-25 DIAGNOSIS — T8484XD Pain due to internal orthopedic prosthetic devices, implants and grafts, subsequent encounter: Secondary | ICD-10-CM | POA: Diagnosis not present

## 2023-11-25 DIAGNOSIS — I10 Essential (primary) hypertension: Secondary | ICD-10-CM | POA: Diagnosis not present

## 2023-11-26 ENCOUNTER — Other Ambulatory Visit: Payer: Self-pay | Admitting: Orthopedic Surgery

## 2023-11-26 DIAGNOSIS — G252 Other specified forms of tremor: Secondary | ICD-10-CM | POA: Diagnosis not present

## 2023-11-26 DIAGNOSIS — G20A1 Parkinson's disease without dyskinesia, without mention of fluctuations: Secondary | ICD-10-CM | POA: Diagnosis not present

## 2023-11-27 DIAGNOSIS — L304 Erythema intertrigo: Secondary | ICD-10-CM | POA: Diagnosis not present

## 2023-11-27 DIAGNOSIS — R0981 Nasal congestion: Secondary | ICD-10-CM | POA: Diagnosis not present

## 2023-11-27 DIAGNOSIS — I10 Essential (primary) hypertension: Secondary | ICD-10-CM | POA: Diagnosis not present

## 2023-11-28 DIAGNOSIS — M48061 Spinal stenosis, lumbar region without neurogenic claudication: Secondary | ICD-10-CM | POA: Diagnosis not present

## 2023-11-28 DIAGNOSIS — Z96651 Presence of right artificial knee joint: Secondary | ICD-10-CM | POA: Diagnosis not present

## 2023-11-28 DIAGNOSIS — F329 Major depressive disorder, single episode, unspecified: Secondary | ICD-10-CM | POA: Diagnosis not present

## 2023-11-28 DIAGNOSIS — M51361 Other intervertebral disc degeneration, lumbar region with lower extremity pain only: Secondary | ICD-10-CM | POA: Diagnosis not present

## 2023-11-28 DIAGNOSIS — M810 Age-related osteoporosis without current pathological fracture: Secondary | ICD-10-CM | POA: Diagnosis not present

## 2023-11-28 DIAGNOSIS — M069 Rheumatoid arthritis, unspecified: Secondary | ICD-10-CM | POA: Diagnosis not present

## 2023-11-28 DIAGNOSIS — Z79899 Other long term (current) drug therapy: Secondary | ICD-10-CM | POA: Diagnosis not present

## 2023-11-28 DIAGNOSIS — I10 Essential (primary) hypertension: Secondary | ICD-10-CM | POA: Diagnosis not present

## 2023-11-28 DIAGNOSIS — M47816 Spondylosis without myelopathy or radiculopathy, lumbar region: Secondary | ICD-10-CM | POA: Diagnosis not present

## 2023-11-28 DIAGNOSIS — T8484XD Pain due to internal orthopedic prosthetic devices, implants and grafts, subsequent encounter: Secondary | ICD-10-CM | POA: Diagnosis not present

## 2023-11-28 DIAGNOSIS — Z9089 Acquired absence of other organs: Secondary | ICD-10-CM | POA: Diagnosis not present

## 2023-12-01 DIAGNOSIS — M51361 Other intervertebral disc degeneration, lumbar region with lower extremity pain only: Secondary | ICD-10-CM | POA: Diagnosis not present

## 2023-12-01 DIAGNOSIS — M47816 Spondylosis without myelopathy or radiculopathy, lumbar region: Secondary | ICD-10-CM | POA: Diagnosis not present

## 2023-12-01 DIAGNOSIS — I10 Essential (primary) hypertension: Secondary | ICD-10-CM | POA: Diagnosis not present

## 2023-12-01 DIAGNOSIS — M069 Rheumatoid arthritis, unspecified: Secondary | ICD-10-CM | POA: Diagnosis not present

## 2023-12-01 DIAGNOSIS — Z9089 Acquired absence of other organs: Secondary | ICD-10-CM | POA: Diagnosis not present

## 2023-12-01 DIAGNOSIS — Z96651 Presence of right artificial knee joint: Secondary | ICD-10-CM | POA: Diagnosis not present

## 2023-12-01 DIAGNOSIS — M810 Age-related osteoporosis without current pathological fracture: Secondary | ICD-10-CM | POA: Diagnosis not present

## 2023-12-01 DIAGNOSIS — M48061 Spinal stenosis, lumbar region without neurogenic claudication: Secondary | ICD-10-CM | POA: Diagnosis not present

## 2023-12-01 DIAGNOSIS — Z79899 Other long term (current) drug therapy: Secondary | ICD-10-CM | POA: Diagnosis not present

## 2023-12-01 DIAGNOSIS — F329 Major depressive disorder, single episode, unspecified: Secondary | ICD-10-CM | POA: Diagnosis not present

## 2023-12-01 DIAGNOSIS — T8484XD Pain due to internal orthopedic prosthetic devices, implants and grafts, subsequent encounter: Secondary | ICD-10-CM | POA: Diagnosis not present

## 2023-12-04 ENCOUNTER — Other Ambulatory Visit: Payer: Self-pay | Admitting: Psychiatry

## 2023-12-04 DIAGNOSIS — F3171 Bipolar disorder, in partial remission, most recent episode hypomanic: Secondary | ICD-10-CM

## 2023-12-04 DIAGNOSIS — Z981 Arthrodesis status: Secondary | ICD-10-CM | POA: Diagnosis not present

## 2023-12-04 DIAGNOSIS — G20A1 Parkinson's disease without dyskinesia, without mention of fluctuations: Secondary | ICD-10-CM | POA: Diagnosis not present

## 2023-12-04 DIAGNOSIS — M533 Sacrococcygeal disorders, not elsewhere classified: Secondary | ICD-10-CM | POA: Diagnosis not present

## 2023-12-04 DIAGNOSIS — E1141 Type 2 diabetes mellitus with diabetic mononeuropathy: Secondary | ICD-10-CM | POA: Diagnosis not present

## 2023-12-05 DIAGNOSIS — M069 Rheumatoid arthritis, unspecified: Secondary | ICD-10-CM | POA: Diagnosis not present

## 2023-12-05 DIAGNOSIS — T8484XD Pain due to internal orthopedic prosthetic devices, implants and grafts, subsequent encounter: Secondary | ICD-10-CM | POA: Diagnosis not present

## 2023-12-05 DIAGNOSIS — F329 Major depressive disorder, single episode, unspecified: Secondary | ICD-10-CM | POA: Diagnosis not present

## 2023-12-05 DIAGNOSIS — M47816 Spondylosis without myelopathy or radiculopathy, lumbar region: Secondary | ICD-10-CM | POA: Diagnosis not present

## 2023-12-05 DIAGNOSIS — M51361 Other intervertebral disc degeneration, lumbar region with lower extremity pain only: Secondary | ICD-10-CM | POA: Diagnosis not present

## 2023-12-05 DIAGNOSIS — I10 Essential (primary) hypertension: Secondary | ICD-10-CM | POA: Diagnosis not present

## 2023-12-05 DIAGNOSIS — M810 Age-related osteoporosis without current pathological fracture: Secondary | ICD-10-CM | POA: Diagnosis not present

## 2023-12-05 DIAGNOSIS — Z96651 Presence of right artificial knee joint: Secondary | ICD-10-CM | POA: Diagnosis not present

## 2023-12-05 DIAGNOSIS — M48061 Spinal stenosis, lumbar region without neurogenic claudication: Secondary | ICD-10-CM | POA: Diagnosis not present

## 2023-12-05 DIAGNOSIS — Z79899 Other long term (current) drug therapy: Secondary | ICD-10-CM | POA: Diagnosis not present

## 2023-12-05 DIAGNOSIS — Z9089 Acquired absence of other organs: Secondary | ICD-10-CM | POA: Diagnosis not present

## 2023-12-10 DIAGNOSIS — F329 Major depressive disorder, single episode, unspecified: Secondary | ICD-10-CM | POA: Diagnosis not present

## 2023-12-10 DIAGNOSIS — Z96651 Presence of right artificial knee joint: Secondary | ICD-10-CM | POA: Diagnosis not present

## 2023-12-10 DIAGNOSIS — Z9089 Acquired absence of other organs: Secondary | ICD-10-CM | POA: Diagnosis not present

## 2023-12-10 DIAGNOSIS — M48061 Spinal stenosis, lumbar region without neurogenic claudication: Secondary | ICD-10-CM | POA: Diagnosis not present

## 2023-12-10 DIAGNOSIS — M069 Rheumatoid arthritis, unspecified: Secondary | ICD-10-CM | POA: Diagnosis not present

## 2023-12-10 DIAGNOSIS — M51361 Other intervertebral disc degeneration, lumbar region with lower extremity pain only: Secondary | ICD-10-CM | POA: Diagnosis not present

## 2023-12-10 DIAGNOSIS — M47816 Spondylosis without myelopathy or radiculopathy, lumbar region: Secondary | ICD-10-CM | POA: Diagnosis not present

## 2023-12-10 DIAGNOSIS — T8484XD Pain due to internal orthopedic prosthetic devices, implants and grafts, subsequent encounter: Secondary | ICD-10-CM | POA: Diagnosis not present

## 2023-12-10 DIAGNOSIS — I10 Essential (primary) hypertension: Secondary | ICD-10-CM | POA: Diagnosis not present

## 2023-12-10 DIAGNOSIS — M810 Age-related osteoporosis without current pathological fracture: Secondary | ICD-10-CM | POA: Diagnosis not present

## 2023-12-10 DIAGNOSIS — Z79899 Other long term (current) drug therapy: Secondary | ICD-10-CM | POA: Diagnosis not present

## 2023-12-15 DIAGNOSIS — T8484XD Pain due to internal orthopedic prosthetic devices, implants and grafts, subsequent encounter: Secondary | ICD-10-CM | POA: Diagnosis not present

## 2023-12-15 DIAGNOSIS — Z9089 Acquired absence of other organs: Secondary | ICD-10-CM | POA: Diagnosis not present

## 2023-12-15 DIAGNOSIS — M48061 Spinal stenosis, lumbar region without neurogenic claudication: Secondary | ICD-10-CM | POA: Diagnosis not present

## 2023-12-15 DIAGNOSIS — M069 Rheumatoid arthritis, unspecified: Secondary | ICD-10-CM | POA: Diagnosis not present

## 2023-12-15 DIAGNOSIS — Z79899 Other long term (current) drug therapy: Secondary | ICD-10-CM | POA: Diagnosis not present

## 2023-12-15 DIAGNOSIS — M47816 Spondylosis without myelopathy or radiculopathy, lumbar region: Secondary | ICD-10-CM | POA: Diagnosis not present

## 2023-12-15 DIAGNOSIS — F329 Major depressive disorder, single episode, unspecified: Secondary | ICD-10-CM | POA: Diagnosis not present

## 2023-12-15 DIAGNOSIS — I10 Essential (primary) hypertension: Secondary | ICD-10-CM | POA: Diagnosis not present

## 2023-12-15 DIAGNOSIS — Z96651 Presence of right artificial knee joint: Secondary | ICD-10-CM | POA: Diagnosis not present

## 2023-12-15 DIAGNOSIS — M810 Age-related osteoporosis without current pathological fracture: Secondary | ICD-10-CM | POA: Diagnosis not present

## 2023-12-15 DIAGNOSIS — M51361 Other intervertebral disc degeneration, lumbar region with lower extremity pain only: Secondary | ICD-10-CM | POA: Diagnosis not present

## 2023-12-23 ENCOUNTER — Encounter: Payer: Self-pay | Admitting: Podiatry

## 2023-12-23 ENCOUNTER — Ambulatory Visit: Payer: Medicare Other | Admitting: Podiatry

## 2023-12-23 DIAGNOSIS — E1141 Type 2 diabetes mellitus with diabetic mononeuropathy: Secondary | ICD-10-CM | POA: Diagnosis not present

## 2023-12-23 DIAGNOSIS — B351 Tinea unguium: Secondary | ICD-10-CM

## 2023-12-23 DIAGNOSIS — M79674 Pain in right toe(s): Secondary | ICD-10-CM

## 2023-12-23 DIAGNOSIS — M79675 Pain in left toe(s): Secondary | ICD-10-CM

## 2023-12-23 NOTE — Progress Notes (Signed)
 This patient returns to my office for at risk foot care.  This patient requires this care by a professional since this patient will be at risk due to having diabetes and Parkinsons. This patient is unable to cut nails herself since the patient cannot reach hernails.These nails are painful walking and wearing shoes.  This patient presents for at risk foot care today.  General Appearance  Alert, conversant and in no acute stress.  Vascular  Dorsalis pedis and posterior tibial  pulses are palpable  bilaterally.  Capillary return is within normal limits  bilaterally. Temperature is within normal limits  bilaterally.  Neurologic  Senn-Weinstein monofilament wire test within normal limits  bilaterally. Muscle power within normal limits bilaterally.  Nails Thick disfigured discolored nails with subungual debris  from hallux to fifth toes bilaterally. No evidence of bacterial infection or drainage bilaterally.  Orthopedic  No limitations of motion  feet .  No crepitus or effusions noted.  No bony pathology or digital deformities noted.  Skin  normotropic skin with no porokeratosis noted bilaterally.  No signs of infections or ulcers noted.     Onychomycosis  Pain in right toes  Pain in left toes    Consent was obtained for treatment procedures.   Mechanical debridement of nails 1-5  bilaterally performed with a nail nipper.  Filed with dremel without incident.    Return office visit    10  weeks.                 Told patient to return for periodic foot care and evaluation due to potential at risk complications.   Helane Gunther DPM

## 2023-12-25 DIAGNOSIS — M069 Rheumatoid arthritis, unspecified: Secondary | ICD-10-CM | POA: Diagnosis not present

## 2023-12-25 DIAGNOSIS — F329 Major depressive disorder, single episode, unspecified: Secondary | ICD-10-CM | POA: Diagnosis not present

## 2023-12-25 DIAGNOSIS — M810 Age-related osteoporosis without current pathological fracture: Secondary | ICD-10-CM | POA: Diagnosis not present

## 2023-12-25 DIAGNOSIS — Z79899 Other long term (current) drug therapy: Secondary | ICD-10-CM | POA: Diagnosis not present

## 2023-12-25 DIAGNOSIS — M47816 Spondylosis without myelopathy or radiculopathy, lumbar region: Secondary | ICD-10-CM | POA: Diagnosis not present

## 2023-12-25 DIAGNOSIS — T8484XD Pain due to internal orthopedic prosthetic devices, implants and grafts, subsequent encounter: Secondary | ICD-10-CM | POA: Diagnosis not present

## 2023-12-25 DIAGNOSIS — I10 Essential (primary) hypertension: Secondary | ICD-10-CM | POA: Diagnosis not present

## 2023-12-25 DIAGNOSIS — M51361 Other intervertebral disc degeneration, lumbar region with lower extremity pain only: Secondary | ICD-10-CM | POA: Diagnosis not present

## 2023-12-25 DIAGNOSIS — M48061 Spinal stenosis, lumbar region without neurogenic claudication: Secondary | ICD-10-CM | POA: Diagnosis not present

## 2023-12-25 DIAGNOSIS — Z9089 Acquired absence of other organs: Secondary | ICD-10-CM | POA: Diagnosis not present

## 2023-12-25 DIAGNOSIS — Z96651 Presence of right artificial knee joint: Secondary | ICD-10-CM | POA: Diagnosis not present

## 2024-01-01 DIAGNOSIS — M48061 Spinal stenosis, lumbar region without neurogenic claudication: Secondary | ICD-10-CM | POA: Diagnosis not present

## 2024-01-01 DIAGNOSIS — M51361 Other intervertebral disc degeneration, lumbar region with lower extremity pain only: Secondary | ICD-10-CM | POA: Diagnosis not present

## 2024-01-01 DIAGNOSIS — Z9089 Acquired absence of other organs: Secondary | ICD-10-CM | POA: Diagnosis not present

## 2024-01-01 DIAGNOSIS — Z79899 Other long term (current) drug therapy: Secondary | ICD-10-CM | POA: Diagnosis not present

## 2024-01-01 DIAGNOSIS — F329 Major depressive disorder, single episode, unspecified: Secondary | ICD-10-CM | POA: Diagnosis not present

## 2024-01-01 DIAGNOSIS — M47816 Spondylosis without myelopathy or radiculopathy, lumbar region: Secondary | ICD-10-CM | POA: Diagnosis not present

## 2024-01-01 DIAGNOSIS — T8484XD Pain due to internal orthopedic prosthetic devices, implants and grafts, subsequent encounter: Secondary | ICD-10-CM | POA: Diagnosis not present

## 2024-01-01 DIAGNOSIS — Z96651 Presence of right artificial knee joint: Secondary | ICD-10-CM | POA: Diagnosis not present

## 2024-01-01 DIAGNOSIS — M069 Rheumatoid arthritis, unspecified: Secondary | ICD-10-CM | POA: Diagnosis not present

## 2024-01-01 DIAGNOSIS — I10 Essential (primary) hypertension: Secondary | ICD-10-CM | POA: Diagnosis not present

## 2024-01-01 DIAGNOSIS — M810 Age-related osteoporosis without current pathological fracture: Secondary | ICD-10-CM | POA: Diagnosis not present

## 2024-01-06 DIAGNOSIS — M25561 Pain in right knee: Secondary | ICD-10-CM | POA: Diagnosis not present

## 2024-01-09 ENCOUNTER — Telehealth: Payer: Self-pay | Admitting: *Deleted

## 2024-01-09 ENCOUNTER — Telehealth: Payer: Self-pay

## 2024-01-09 NOTE — Telephone Encounter (Signed)
 Pt has been scheduled tele preop appt 01/15/24. Med rec and consent are done.

## 2024-01-09 NOTE — Telephone Encounter (Signed)
Pt returning call regarding Pre-Op appt. Please advise 

## 2024-01-09 NOTE — Telephone Encounter (Signed)
 Pt has been scheduled tele preop appt 01/15/24. Med rec and consent are done.      Patient Consent for Virtual Visit        Kristin Gilmore has provided verbal consent on 01/09/2024 for a virtual visit (video or telephone).   CONSENT FOR VIRTUAL VISIT FOR:  Kristin Gilmore  By participating in this virtual visit I agree to the following:  I hereby voluntarily request, consent and authorize [facility] and its employed or contracted physicians, physician assistants, nurse practitioners or other licensed health care professionals (the Practitioner), to provide me with telemedicine health care services (the "Services") as deemed necessary by the treating Practitioner. I acknowledge and consent to receive the Services by the Practitioner via telemedicine. I understand that the telemedicine visit will involve communicating with the Practitioner through live audiovisual communication technology and the disclosure of certain medical information by electronic transmission. I acknowledge that I have been given the opportunity to request an in-person assessment or other available alternative prior to the telemedicine visit and am voluntarily participating in the telemedicine visit.  I understand that I have the right to withhold or withdraw my consent to the use of telemedicine in the course of my care at any time, without affecting my right to future care or treatment, and that the Practitioner or I may terminate the telemedicine visit at any time. I understand that I have the right to inspect all information obtained and/or recorded in the course of the telemedicine visit and may receive copies of available information for a reasonable fee.  I understand that some of the potential risks of receiving the Services via telemedicine include:  Delay or interruption in medical evaluation due to technological equipment failure or disruption; Information transmitted may not be sufficient (e.g.  poor resolution of images) to allow for appropriate medical decision making by the Practitioner; and/or  In rare instances, security protocols could fail, causing a breach of personal health information.  Furthermore, I acknowledge that it is my responsibility to provide information about my medical history, conditions and care that is complete and accurate to the best of my ability. I acknowledge that Practitioner's advice, recommendations, and/or decision may be based on factors not within their control, such as incomplete or inaccurate data provided by me or distortions of diagnostic images or specimens that may result from electronic transmissions. I understand that the practice of medicine is not an exact science and that Practitioner makes no warranties or guarantees regarding treatment outcomes. I acknowledge that a copy of this consent can be made available to me via my patient portal [facility] MyChart), or I can request a printed copy by calling the office of [facility].    I understand that my insurance will be billed for this visit.   I have read or had this consent read to me. I understand the contents of this consent, which adequately explains the benefits and risks of the Services being provided via telemedicine.  I have been provided ample opportunity to ask questions regarding this consent and the Services and have had my questions answered to my satisfaction. I give my informed consent for the services to be provided through the use of telemedicine in my medical care

## 2024-01-09 NOTE — Telephone Encounter (Signed)
   Name: Kristin Gilmore  DOB: [birth year]  MRN: [id number]  Primary Cardiologist: Thomasene Ripple, DO   Preoperative team, please contact this patient and set up a phone call appointment for further preoperative risk assessment. Please obtain consent and complete medication review. Thank you for your help.  I confirm that guidance regarding antiplatelet and oral anticoagulation therapy has been completed and, if necessary, noted below.  Patient can hold aspirin 5 to 7 days prior to procedure and should restart postprocedure when surgically safe.  I also confirmed the patient resides in the state of [place]. As per [facility] Medical Board telemedicine laws, the patient must reside in the state in which the provider is licensed.   Napoleon Form, Leodis Rains, NP 01/09/2024, 8:24 AM [facility]

## 2024-01-09 NOTE — Telephone Encounter (Signed)
   Pre-operative Risk Assessment    Patient Name: Kristin Gilmore  DOB: [birth year] MRN: [id number]   Date of last office visit: 07/17/23 KARDIE TOBB, DO Date of next office visit: NONE   Request for Surgical Clearance    Procedure:   RIGHT KNEE PATEOLLECTOMY  Date of Surgery:  Clearance TBD                                Surgeon:  Weber Cooks, MD Surgeon's Group or Practice Name:  [facility] Phone number:  [phone]  X [phone] Fax number:  [phone]   Type of Clearance Requested:   - Medical  - Pharmacy:  Hold Aspirin     Type of Anesthesia:   CHOICE   Additional requests/questions:    Nolon Rod   01/09/2024, 8:18 AM

## 2024-01-09 NOTE — Telephone Encounter (Signed)
 Left a message to call back to schedule a tele pre op appt.

## 2024-01-13 DIAGNOSIS — J301 Allergic rhinitis due to pollen: Secondary | ICD-10-CM | POA: Diagnosis not present

## 2024-01-13 DIAGNOSIS — B9689 Other specified bacterial agents as the cause of diseases classified elsewhere: Secondary | ICD-10-CM | POA: Diagnosis not present

## 2024-01-13 DIAGNOSIS — J329 Chronic sinusitis, unspecified: Secondary | ICD-10-CM | POA: Diagnosis not present

## 2024-01-13 DIAGNOSIS — Z6835 Body mass index (BMI) 35.0-35.9, adult: Secondary | ICD-10-CM | POA: Diagnosis not present

## 2024-01-14 NOTE — Progress Notes (Unsigned)
 Virtual Visit via Telephone Note   Because of Kristin Gilmore co-morbid illnesses, she is at least at moderate risk for complications without adequate follow up.  This format is felt to be most appropriate for this patient at this time.  Due to technical limitations with video connection (technology), today's appointment will be conducted as an audio only telehealth visit, and Kristin Gilmore verbally agreed to proceed in this manner.   All issues noted in this document were discussed and addressed.  No physical exam could be performed with this format.  Evaluation Performed:  Preoperative cardiovascular risk assessment _____________   Date:  01/14/2024   Patient ID:  Kristin Gilmore, DOB [birth year], MRN [id number] Patient Location:  Home Provider location:   Office  Primary Care Provider:  Jackelyn Poling, DO Primary Cardiologist:  Thomasene Ripple, DO  Chief Complaint / Patient Profile   73 y.o. y/o female with a h/o CAD, HLD, HTN, prediabetes, Parkinson's disease, anxiety who is pending right knee patellectomy and presents today for telephonic preoperative cardiovascular risk assessment.  History of Present Illness    Kristin Gilmore is a 73 y.o. female who presents via audio/video conferencing for a telehealth visit today.  Pt was last seen in cardiology clinic on 08/14/2023 by Dr. Servando Salina.  At that time Kristin Gilmore was doing well and endorsed some chest pain with nitroglycerin with plan to monitor closely.  Coronary CTA that showed minimal plaque in the LAD and RCA with aortic atherosclerosis. The patient is now pending procedure as outlined above. Since her last visit, she reports no recurrence of chest pain and has not had to use nitroglycerin since her visit.  She is active but limited based on her current knee injury.  She is able to complete greater than 4 METS of activity at her pace.  She denies chest pain, shortness of breath, lower extremity edema, fatigue,  palpitations, melena, hematuria, hemoptysis, diaphoresis, weakness, presyncope, syncope, orthopnea, and PND.    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   Pneumonia    Pre-diabetes    "i was told i was pre-diabetic a while ago"   Tremor 04/13/2014   Past Surgical History:  Procedure Laterality Date   CHOLECYSTECTOMY     COLONOSCOPY W/ POLYPECTOMY     LUMBAR LAMINECTOMY  07/2020   3 level decompression - [place], [place]   LUMBAR LAMINECTOMY/DECOMPRESSION MICRODISCECTOMY N/A 12/19/2017   Procedure: LAMINECTOMY LUMBAR TWO- LUMBAR THREE, LUMBAR THREE- LUMBAR FOUR, LUMBAR FOUR- LUMBAR FIVE ;  Surgeon: Lisbeth Renshaw, MD;  Location: [facility] OR;  Service: Neurosurgery;  Laterality: N/A;   NASAL SINUS SURGERY     RADIOLOGY WITH ANESTHESIA N/A 05/30/2022   Procedure: MRI WITH ANESTHESIA LUMBAR SPINE W/WO CONSTRAST;  Surgeon: Radiologist, Medication, MD;  Location: [facility] OR;  Service: Radiology;  Laterality: N/A;   TEE WITHOUT CARDIOVERSION N/A 06/05/2022   Procedure: TRANSESOPHAGEAL ECHOCARDIOGRAM (TEE);  Surgeon: Chrystie Nose, MD;  Location: [facility] ENDOSCOPY;  Service: Cardiovascular;  Laterality: N/A;   TONSILLECTOMY     TOTAL KNEE ARTHROPLASTY Right 06/26/2021  Procedure: RIGHT TOTAL KNEE ARTHROPLASTY;  Surgeon: Wes Hamman, MD;  Location: [facility] OR;  Service: Orthopedics;  Laterality: Right;   WISDOM TOOTH EXTRACTION      Allergies  Allergies  Allergen Reactions   Levofloxacin Other (See Comments)    Dizziness (intolerance)  Other Reaction(s): Dizziness   Amoxicillin Hives    UNSPECIFIED REACTION   Has patient had a PCN  reaction causing immediate rash, facial/tongue/throat swelling, SOB or lightheadedness with hypotension: Unknown  Has patient had a PCN reaction causing severe rash involving mucus membranes or skin necrosis: Unknown  Has patient had a PCN reaction that required hospitalization: Unknown  Has patient had a PCN reaction occurring within the last 10 years: No  If all of the above answers are "NO", then may proceed with Cephalosporin use.  Sweats  Other Reaction(s): Unknown   Clarithromycin Hives, Other (See Comments) and Rash    Other Reaction(s): GI Intolerance, Other (See Comments), Unknown   Atropine Hives    Home Medications    Prior to Admission medications   Medication Sig Start Date End Date Taking? Authorizing Provider  acetaminophen (TYLENOL) 500 MG tablet Take 1,000 mg by mouth every 6 (six) hours as needed for mild pain or moderate pain.    [provider]  amitriptyline (ELAVIL) 10 MG tablet Take 1 tablet (10 mg total) by mouth at bedtime as needed for sleep. Patient taking differently: Take 25 mg by mouth at bedtime. 06/08/22   Etter Hermann., MD  amLODipine (NORVASC) 5 MG tablet Take 5 mg by mouth 2 (two) times daily. 07/27/18   [provider]  amoxicillin (AMOXIL) 500 MG capsule Take 500 mg by mouth 3 (three) times daily.    [provider]  aspirin EC 81 MG tablet Take 81 mg by mouth daily. Swallow whole.    [provider]  Azelastine HCl 137 MCG/SPRAY SOLN Place into both nostrils. AS DIRECTED 01/08/24   [provider]  bisacodyl (DULCOLAX) 5 MG EC tablet Take 5-15 mg by mouth at bedtime as needed (constipation).    [provider]  BLACK COHOSH PO Take 1 tablet by mouth daily.    [provider]  Calcium Citrate-Vitamin D (CALCIUM CITRATE + D PO) Take 1-2 capsules by mouth See admin instructions. Calcium 200 mg, vitamin D3 2.5 mcg - 100 units - take 1 tablet by mouth with breakfast and 2 tablets with  supper    [provider]  carbidopa-levodopa (SINEMET IR) 25-100 MG tablet Take 1 tablet by mouth 4 (four) times daily. 12/25/22   [provider]  carvedilol (COREG) 12.5 MG tablet Take 12.5 mg by mouth 2 (two) times daily. 03/24/23   [provider]  Cholecalciferol (VITAMIN D3) 50 MCG (2000 UT) capsule Take 2,000 Units by mouth daily.    [provider]  Coenzyme Q10 (COQ10) 100 MG CAPS Take 100 mg by mouth daily with supper.    [provider]  diazepam (VALIUM) 5 MG tablet Take 1 tablet (5 mg total) by mouth once as needed for up to 1 dose (prior to nerve study). 03/18/23   Diedra Fowler, MD  divalproex (DEPAKOTE ER) 250 MG 24 hr tablet Take 3 tablets (750 mg total) by mouth daily. 11/19/23   Cottle, Kennedy Peabody., MD  docusate sodium (COLACE) 100 MG capsule Take 200-300 mg by mouth at bedtime.    [provider]  Homeopathic Products [facility] RELIEF EX) Apply 1 application  topically daily  as needed (Knee pain).    [provider]  hyoscyamine (ANASPAZ) 0.125 MG TBDP disintergrating tablet Take 0.125 mg by mouth daily as needed (Stomach pain). 03/06/23   [provider]  ibuprofen (ADVIL) 200 MG tablet Take 400 mg by mouth daily as needed for headache.    [provider]  loperamide (IMODIUM) 2 MG capsule Take 2 mg by mouth 4 (four) times daily as needed for diarrhea or loose stools.    [provider]  MAGNESIUM CITRATE PO Take 150 mg by mouth at bedtime.    [provider]  Magnesium Oxide 500 MG TABS Take 500 mg by mouth at bedtime.    [provider]  Menthol, Topical Analgesic, (BIOFREEZE) 4 % GEL Apply 1 application  topically 4 (four) times daily as needed (pain).    [provider]  nitroGLYCERIN (NITROSTAT) 0.4 MG SL tablet Place 1 tablet (0.4 mg total) under the tongue every 5 (five) minutes as needed for chest pain. 08/14/23 11/12/23  Tobb, Lavona Mound, DO  Omega-3 Fatty Acids  (FISH OIL) 500 MG CAPS Take 500 mg by mouth 2 (two) times daily. Super    [provider]  OVER THE COUNTER MEDICATION Take 50 mg by mouth 2 (two) times daily. PETADOLEX - Health brain blood vessel relaxation (butterburr root extract)    [provider]  OVER THE COUNTER MEDICATION Apply 1 application  topically daily as needed (Pain). Bengay    [provider]  pregabalin (LYRICA) 75 MG capsule TAKE 1 CAPSULE BY MOUTH THREE TIMES A DAY 11/26/23   London Sheer, MD  QUEtiapine (SEROQUEL) 300 MG tablet TAKE 0.5 TABLETS (150 MG TOTAL) BY MOUTH AT BEDTIME AS NEEDED. 12/04/23   Cottle, Steva Ready., MD  Red Yeast Rice 600 MG CAPS Take 1,200 mg by mouth 2 (two) times daily with a meal.    [provider]  rosuvastatin (CRESTOR) 20 MG tablet Take 20 mg by mouth every Monday, Wednesday, and Friday. Dinner 05/01/23   [provider]  telmisartan (MICARDIS) 80 MG tablet Take 80 mg by mouth at bedtime.    [provider]  Turmeric 500 MG CAPS Take 500 mg by mouth 2 (two) times daily with a meal. extract    [provider]    Physical Exam    Vital Signs:  Kristin Gilmore does not have vital signs available for review today.124/68  Given telephonic nature of communication, physical exam is limited. AAOx3. NAD. Normal affect.  Speech and respirations are unlabored.  Accessory Clinical Findings    None  Assessment & Plan    1.  Preoperative Cardiovascular Risk Assessment: - Patient's RCRI score is 0.9%  The patient affirms she has been doing well without any new cardiac symptoms. They are able to achieve 5 METS without cardiac limitations. Therefore, based on ACC/AHA guidelines, the patient would be at acceptable risk for the planned procedure without further cardiovascular testing. The patient was advised that if she develops new symptoms prior to surgery to contact our office to arrange for a follow-up visit, and she verbalized  understanding.   The patient was advised that if she develops new symptoms prior to surgery to contact our office to arrange for a follow-up visit, and she verbalized understanding.  Patient can hold aspirin 5 to 7 days prior to procedure and should restart postprocedure when surgically safe.   A copy of this note will be routed to requesting surgeon.  Time:  Today, I have spent 6 minutes with the patient with telehealth technology discussing medical history, symptoms, and management plan.     Francene Ing, Retha Cast, NP  01/14/2024, 3:15 PM

## 2024-01-15 ENCOUNTER — Ambulatory Visit: Attending: Nurse Practitioner

## 2024-01-15 DIAGNOSIS — Z01810 Encounter for preprocedural cardiovascular examination: Secondary | ICD-10-CM

## 2024-01-16 ENCOUNTER — Other Ambulatory Visit: Payer: Self-pay | Admitting: Psychiatry

## 2024-01-16 DIAGNOSIS — F3110 Bipolar disorder, current episode manic without psychotic features, unspecified: Secondary | ICD-10-CM

## 2024-01-18 NOTE — Telephone Encounter (Signed)
Has appt 4/21

## 2024-01-18 NOTE — Telephone Encounter (Signed)
 Has apt tomorrow 04/21

## 2024-01-19 ENCOUNTER — Ambulatory Visit: Payer: Medicare Other | Admitting: Psychiatry

## 2024-01-19 ENCOUNTER — Encounter: Payer: Self-pay | Admitting: Psychiatry

## 2024-01-19 DIAGNOSIS — F99 Mental disorder, not otherwise specified: Secondary | ICD-10-CM | POA: Diagnosis not present

## 2024-01-19 DIAGNOSIS — G43001 Migraine without aura, not intractable, with status migrainosus: Secondary | ICD-10-CM

## 2024-01-19 DIAGNOSIS — F3171 Bipolar disorder, in partial remission, most recent episode hypomanic: Secondary | ICD-10-CM

## 2024-01-19 DIAGNOSIS — F5105 Insomnia due to other mental disorder: Secondary | ICD-10-CM | POA: Diagnosis not present
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
## 2024-01-20 ENCOUNTER — Other Ambulatory Visit (HOSPITAL_COMMUNITY): Payer: Self-pay | Admitting: Family Medicine

## 2024-01-20 DIAGNOSIS — Z6835 Body mass index (BMI) 35.0-35.9, adult: Secondary | ICD-10-CM | POA: Diagnosis not present

## 2024-01-20 DIAGNOSIS — R519 Headache, unspecified: Secondary | ICD-10-CM | POA: Diagnosis not present

## 2024-01-20 DIAGNOSIS — G8929 Other chronic pain: Secondary | ICD-10-CM | POA: Diagnosis not present

## 2024-01-23 ENCOUNTER — Ambulatory Visit (HOSPITAL_COMMUNITY): Admission: RE | Admit: 2024-01-23 | Source: Ambulatory Visit

## 2024-01-27 ENCOUNTER — Ambulatory Visit (HOSPITAL_COMMUNITY)
Admission: RE | Admit: 2024-01-27 | Discharge: 2024-01-27 | Disposition: A | Discharge status: Home / Self Care | Source: Ambulatory Visit | Attending: Family Medicine | Admitting: Family Medicine

## 2024-01-27 DIAGNOSIS — J320 Chronic maxillary sinusitis: Secondary | ICD-10-CM | POA: Diagnosis not present

## 2024-01-27 DIAGNOSIS — G8929 Other chronic pain: Secondary | ICD-10-CM | POA: Insufficient documentation

## 2024-01-27 DIAGNOSIS — R519 Headache, unspecified: Secondary | ICD-10-CM | POA: Diagnosis not present

## 2024-01-27 MED ORDER — GADOBUTROL 1 MMOL/ML IV SOLN
8.0000 mL | Freq: Once | INTRAVENOUS | Status: AC | PRN
  Administered 2024-01-27: 8 mL via INTRAVENOUS

## 2024-01-29 ENCOUNTER — Encounter (HOSPITAL_BASED_OUTPATIENT_CLINIC_OR_DEPARTMENT_OTHER): Payer: Self-pay

## 2024-01-29 ENCOUNTER — Other Ambulatory Visit: Payer: Self-pay

## 2024-01-29 ENCOUNTER — Emergency Department (HOSPITAL_BASED_OUTPATIENT_CLINIC_OR_DEPARTMENT_OTHER)
Admission: EM | Admit: 2024-01-29 | Discharge: 2024-01-29 | Disposition: A | Discharge status: Home / Self Care | Attending: Emergency Medicine | Admitting: Emergency Medicine

## 2024-01-29 DIAGNOSIS — R519 Headache, unspecified: Secondary | ICD-10-CM | POA: Insufficient documentation

## 2024-01-29 DIAGNOSIS — G20C Parkinsonism, unspecified: Secondary | ICD-10-CM | POA: Diagnosis not present

## 2024-01-29 DIAGNOSIS — Z21 Asymptomatic human immunodeficiency virus [HIV] infection status: Secondary | ICD-10-CM | POA: Diagnosis not present

## 2024-01-29 DIAGNOSIS — Z7982 Long term (current) use of aspirin: Secondary | ICD-10-CM | POA: Diagnosis not present

## 2024-01-29 MED ORDER — DIPHENHYDRAMINE HCL 50 MG/ML IJ SOLN
12.5000 mg | Freq: Once | INTRAMUSCULAR | Status: DC
  Filled 2024-01-29: qty 1

## 2024-01-29 MED ORDER — METOCLOPRAMIDE HCL 10 MG PO TABS: 10.0000 mg | ORAL_TABLET | Freq: Four times a day (QID) | ORAL | 0 refills | Status: DC

## 2024-01-29 MED ORDER — METOCLOPRAMIDE HCL 5 MG/ML IJ SOLN
10.0000 mg | Freq: Once | INTRAMUSCULAR | Status: DC
  Filled 2024-01-29: qty 2

## 2024-01-29 MED ORDER — KETOROLAC TROMETHAMINE 15 MG/ML IJ SOLN
15.0000 mg | Freq: Once | INTRAMUSCULAR | Status: AC
  Administered 2024-01-29: 15 mg via INTRAVENOUS
  Filled 2024-01-29: qty 1

## 2024-01-29 MED ORDER — HYDROCODONE-ACETAMINOPHEN 5-325 MG PO TABS: 1.0000 | ORAL_TABLET | Freq: Four times a day (QID) | ORAL | 0 refills | Status: DC | PRN

## 2024-01-29 NOTE — ED Notes (Signed)
 RN reviewed discharge instructions with pt. Pt verbalized understanding and had no further questions. VSS upon discharge.

## 2024-01-29 NOTE — Discharge Instructions (Signed)
 Please read and follow all provided instructions.  Your diagnoses today include:  1. Acute nonintractable headache, unspecified headache type     Tests performed today include: CT of your head which was normal and did not show any serious cause of your headache Vital signs. See below for your results today.   Medications:  In the Emergency Department you received: Toradol  - NSAID medication similar to ibuprofen   Medications prescribed: Reglan : nausea medication that can help with headache  Vicodin (hydrocodone /acetaminophen ) - narcotic pain medication  DO NOT drive or perform any activities that require you to be awake and alert because this medicine can make you drowsy. BE VERY CAREFUL not to take multiple medicines containing Tylenol  (also called acetaminophen ). Doing so can lead to an overdose which can damage your liver and cause liver failure and possibly death.  Use pain medication only under direct supervision at the lowest possible dose needed to control your pain.   Take any prescribed medications only as directed.  Additional information:  Follow any educational materials contained in this packet.  You are having a headache. No specific cause was found today for your headache. It may have been a migraine or other cause of headache. Stress, anxiety, fatigue, and depression are common triggers for headaches.   Your headache today does not appear to be life-threatening or require hospitalization, but often the exact cause of headaches is not determined in the emergency department. Therefore, follow-up with your doctor is very important to find out what may have caused your headache and whether or not you need any further diagnostic testing or treatment.   Sometimes headaches can appear benign (not harmful), but then more serious symptoms can develop which should prompt an immediate re-evaluation by your doctor or the emergency department.  BE VERY CAREFUL not to take multiple  medicines containing Tylenol  (also called acetaminophen ). Doing so can lead to an overdose which can damage your liver and cause liver failure and possibly death.   Follow-up instructions: Please follow-up with your primary care provider in the next 3 days for further evaluation of your symptoms.   Return instructions:  Please return to the Emergency Department if you experience worsening symptoms. Return if the medications do not resolve your headache, if it recurs, or if you have multiple episodes of vomiting or cannot keep down fluids. Return if you have a change from the usual headache. RETURN IMMEDIATELY IF you: Develop a sudden, severe headache Develop confusion or become poorly responsive or faint Develop a fever above 100.26F or problem breathing Have a change in speech, vision, swallowing, or understanding Develop new weakness, numbness, tingling, incoordination in your arms or legs Have a seizure Please return if you have any other emergent concerns.  Additional Information:  Your vital signs today were: BP (!) 144/77   Pulse 72   Temp 97.9 F (36.6 C)   Resp 20   Ht 5' 7.5" (1.715 m)   Wt 86.2 kg   SpO2 95%   BMI 29.32 kg/m  If your blood pressure (BP) was elevated above 135/85 this visit, please have this repeated by your doctor within one month. --------------

## 2024-01-29 NOTE — ED Provider Notes (Addendum)
 [place] [facility]ERGENCY DEPARTMENT AT [facility] Provider Note   CSN: [id number] Arrival date & time: 01/29/24  1355     History  Chief Complaint  Patient presents with   Migraine    Kristin Gilmore is a 73 y.o. female.  Patient presents emergency department for evaluation of headaches.  Patient states that typically she will get an occasional headache, improved with Tylenol .  She has had more persistent frontal headache over the past 3 weeks.  This is a daily headache.  No vision changes, light sensitivity.  No vomiting.  Patient has seen her PCP for this.  She had an MRI 2 days ago which showed only sinus disease.  She was started on cefuroxime for this as well as prednisone  which she has been taking.  She was also prescribed Nurtec for headaches.  Patient has a history of Parkinson's disease and has a tremor at baseline.  This is unchanged. Patient denies signs of stroke including: facial droop, slurred speech, aphasia, weakness/numbness in extremities, imbalance/trouble walking.  Patient has nitroglycerin  to use for chest pains.  She has not been using this regularly.  She reports needing to take it a few nights ago x 1, but prior to that it had been more than half a year.  Patient takes carbamazepine and pregabalin  daily.         Home Medications Prior to Admission medications   Medication Sig Start Date End Date Taking? Authorizing Provider  acetaminophen  (TYLENOL ) 500 MG tablet Take 1,000 mg by mouth every 6 (six) hours as needed for mild pain or moderate pain.    [provider]  amitriptyline  (ELAVIL ) 10 MG tablet Take 1 tablet (10 mg total) by mouth at bedtime as needed for sleep. Patient taking differently: Take 25 mg by mouth at bedtime. 06/08/22   Etter Hermann., MD  amLODipine  (NORVASC ) 5 MG tablet Take 5 mg by mouth 2 (two) times daily. 07/27/18   [provider]  aspirin  EC 81 MG tablet Take 81 mg by mouth daily. Swallow whole.     [provider]  Azelastine HCl 137 MCG/SPRAY SOLN Place into both nostrils. AS DIRECTED 01/08/24   [provider]  bisacodyl  (DULCOLAX) 5 MG EC tablet Take 5-15 mg by mouth at bedtime as needed (constipation).    [provider]  BLACK COHOSH PO Take 1 tablet by mouth daily.    [provider]  Calcium  Citrate-Vitamin D  (CALCIUM  CITRATE + D PO) Take 1-2 capsules by mouth See admin instructions. Calcium  200 mg, vitamin D3 2.5 mcg - 100 units - take 1 tablet by mouth with breakfast and 2 tablets with supper    [provider]  carbidopa-levodopa (SINEMET IR) 25-100 MG tablet Take 1 tablet by mouth 4 (four) times daily. 12/25/22   [provider]  carvedilol  (COREG ) 12.5 MG tablet Take 12.5 mg by mouth 2 (two) times daily. 03/24/23   [provider]  Cholecalciferol  (VITAMIN D3) 50 MCG (2000 UT) capsule Take 2,000 Units by mouth daily.    [provider]  Coenzyme Q10 (COQ10) 100 MG CAPS Take 100 mg by mouth daily with supper.    [provider]  divalproex  (DEPAKOTE  ER) 250 MG 24 hr tablet Take 1 tablet (250 mg total) by mouth 2 (two) times daily. 01/19/24   Cottle, Kennedy Peabody., MD  docusate sodium  (COLACE) 100 MG capsule Take 200-300 mg by mouth at bedtime.    [provider]  doxycycline (VIBRA-TABS)  100 MG tablet Take 100 mg by mouth 2 (two) times daily. 01/13/24   [provider]  Homeopathic Products [facility] RELIEF EX) Apply 1 application  topically daily as needed (Knee pain).    [provider]  hyoscyamine (ANASPAZ) 0.125 MG TBDP disintergrating tablet Take 0.125 mg by mouth daily as needed (Stomach pain). 03/06/23   [provider]  ibuprofen  (ADVIL ) 200 MG tablet Take 400 mg by mouth daily as needed for headache.    [provider]  loperamide  (IMODIUM ) 2 MG capsule Take 2 mg by mouth 4 (four) times daily as needed for diarrhea or loose stools.    [provider]   MAGNESIUM  CITRATE PO Take 150 mg by mouth at bedtime.    [provider]  Magnesium  Oxide 500 MG TABS Take 500 mg by mouth at bedtime.    [provider]  Menthol , Topical Analgesic, (BIOFREEZE) 4 % GEL Apply 1 application  topically 4 (four) times daily as needed (pain).    [provider]  nitroGLYCERIN  (NITROSTAT ) 0.4 MG SL tablet Place 1 tablet (0.4 mg total) under the tongue every 5 (five) minutes as needed for chest pain. 08/14/23 11/12/23  Tobb, Kardie, DO  Omega-3 Fatty Acids (FISH OIL) 500 MG CAPS Take 500 mg by mouth 2 (two) times daily. Super    [provider]  OVER THE COUNTER MEDICATION Take 50 mg by mouth 2 (two) times daily. PETADOLEX - Health brain blood vessel relaxation (butterburr root extract)    [provider]  OVER THE COUNTER MEDICATION Apply 1 application  topically daily as needed (Pain). [place]    [provider]  pregabalin  (LYRICA ) 75 MG capsule TAKE 1 CAPSULE BY MOUTH THREE TIMES A DAY 11/26/23   Diedra Fowler, MD  Red Yeast Rice 600 MG CAPS Take 1,200 mg by mouth 2 (two) times daily with a meal.    [provider]  rosuvastatin (CRESTOR) 20 MG tablet Take 20 mg by mouth every Monday, Wednesday, and Friday. Dinner 05/01/23   [provider]  telmisartan (MICARDIS) 80 MG tablet Take 80 mg by mouth at bedtime.    [provider]  Turmeric 500 MG CAPS Take 500 mg by mouth 2 (two) times daily with a meal. extract    [provider]      Allergies    Levofloxacin, Amoxicillin, Clarithromycin, and Atropine    Review of Systems   Review of Systems  Physical Exam Updated Vital Signs BP (!) 144/77   Pulse 72   Temp 97.9 F (36.6 C)   Resp 20   Ht 5' 7.5" (1.715 m)   Wt 86.2 kg   SpO2 95%   BMI 29.32 kg/m  Physical Exam Vitals and nursing note reviewed.  Constitutional:      Appearance: She is well-developed.  HENT:     Head: Normocephalic and atraumatic.     Right  Ear: Tympanic membrane, ear canal and external ear normal.     Left Ear: Tympanic membrane, ear canal and external ear normal.     Nose: Nose normal.     Mouth/Throat:     Pharynx: Uvula midline.  Eyes:     General: Lids are normal.     Extraocular Movements:     Right eye: No nystagmus.     Left eye: No nystagmus.     Conjunctiva/sclera: Conjunctivae normal.     Pupils: Pupils are equal, round, and reactive to light.  Cardiovascular:  Rate and Rhythm: Normal rate and regular rhythm.  Pulmonary:     Effort: Pulmonary effort is normal.     Breath sounds: Normal breath sounds.  Abdominal:     Palpations: Abdomen is soft.     Tenderness: There is no abdominal tenderness.  Musculoskeletal:     Cervical back: Normal range of motion and neck supple. No tenderness or bony tenderness.  Skin:    General: Skin is warm and dry.  Neurological:     Mental Status: She is alert and oriented to person, place, and time.     GCS: GCS eye subscore is 4. GCS verbal subscore is 5. GCS motor subscore is 6.     Cranial Nerves: No cranial nerve deficit.     Sensory: No sensory deficit.     Motor: Tremor present. No weakness.     Coordination: Coordination normal.     Comments: Upper extremity myotomes tested bilaterally:  C5 Shoulder abduction 5/5 C6 Elbow flexion/wrist extension 5/5 C7 Elbow extension 5/5 C8 Finger flexion 5/5 T1 Finger abduction 5/5  Lower extremity myotomes tested bilaterally: L2 Hip flexion 5/5 L3 Knee extension 5/5 L4 Ankle dorsiflexion 5/5 S1 Ankle plantar flexion 5/5      ED Results / Procedures / Treatments   Labs (all labs ordered are listed, but only abnormal results are displayed) Labs Reviewed - No data to display  EKG None  Radiology MR BRAIN W WO CONTRAST Result Date: 01/27/2024 EXAM: MRI BRAIN WITH AND WITHOUT CONTRAST 01/27/2024 04:17:34 PM TECHNIQUE: Multiplanar multisequence MRI of the head/brain was performed with and without the administration  of intravenous contrast. COMPARISON: None available. CLINICAL HISTORY: Constant headache for 3 weeks. FINDINGS: INTRACRANIAL STRUCTURES AND VENTRICLES: There is no acute infarct. No mass effect or midline shift. No evidence of an acute intracranial hemorrhage. The ventricles and sulci are normal in size and configuration. The sellar/suprasellar regions appear unremarkable. The normal signal voids within the major intracranial vessels appear maintained. No abnormal focus of enhancement is seen within the brain. Background of mild chronic small vessel disease. ORBITS: The visualized portion of the orbits demonstrate no acute abnormality. SINUSES: Postoperative changes of prior functional endoscopic sinus surgery. Mild mucosal disease in the left maxillary sinus. Small amount of fluid in the left mastoid air cells. BONES AND SOFT TISSUES: The bone marrow signal intensity appears normal. The soft tissues demonstrate no acute abnormality. Upper cervical spondylosis with disc extrusion at C3-4 resulting in at least mild spinal canal stenosis. Upper cervical facet arthropathy. IMPRESSION: 1. No acute intracranial abnormality. 2. Mild chronic small vessel disease. 3. Mild left maxillary sinus disease. 4. 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 Electronically signed by: Audra Blend 01/27/2024 06:10 PM EDT RP Workstation: [id number]    Procedures Procedures    Medications Ordered in ED Medications  metoCLOPramide  (REGLAN ) injection 10 mg (10 mg Intravenous Not Given 01/29/24 1536)  diphenhydrAMINE  (BENADRYL ) injection 12.5 mg (12.5 mg Intravenous Not Given 01/29/24 1537)  ketorolac  (TORADOL ) 15 MG/ML injection 15 mg (15 mg Intravenous Given 01/29/24 1435)    ED Course/ Medical Decision Making/ A&P    Patient seen and examined. History obtained directly from patient. Reviewed recent MRI results.   Labs/EKG: None  ordered  Imaging: None ordered  Medications/Fluids: Ordered: IV Toradol .   Most recent vital signs reviewed and are as follows: BP (!) 144/77   Pulse 72   Temp 97.9 F (36.6 C)  Resp 20   Ht 5' 7.5" (1.715 m)   Wt 86.2 kg   SpO2 95%   BMI 29.32 kg/m   Initial impression: Headache  Patient has tried Nurtec, OTC meds without improvement.  She is already on prednisone  for sinus infection and antibiotics for this.  She takes carbamazepine and Lyrica  chronically.  Patient currently does not have a ride home, so unfortunately cannot give migraine cocktail at this time.  3:28 PM Reassessment performed. Patient appears stable.  She is sitting at bedside.  Reports no improvement with Toradol .  Reports "I feel like garbage".  Reviewed pertinent lab work and imaging with patient at bedside. Questions answered.   Most current vital signs reviewed and are as follows: BP (!) 144/77   Pulse 72   Temp 97.9 F (36.6 C)   Resp 20   Ht 5' 7.5" (1.715 m)   Wt 86.2 kg   SpO2 95%   BMI 29.32 kg/m   Plan: Patient would like to proceed with migraine cocktail.  RN has a Voucher that patient can use to ensure safe ride home.  Patient is comfortable with this.  She states that she can leave her car here and will have a driver that can bring her tomorrow to pick up her car.  Reglan  10 mg IV and Benadryl  12.5 mg IV ordered.  Considered Decadron , however patient is already on prednisone .  3:43 PM Reassessment performed. Patient appears comfortable.  Now declining IV medication.  Reviewed pertinent lab work and imaging with patient at bedside. Questions answered.   Most current vital signs reviewed and are as follows: BP (!) 144/77   Pulse 72   Temp 97.9 F (36.6 C)   Resp 20   Ht 5' 7.5" (1.715 m)   Wt 86.2 kg   SpO2 95%   BMI 29.32 kg/m   Plan: Discharge to home.   Prescriptions written for: Reglan  #10 tablets to trial for nausea, Vicodin #5 tabs to use short-term until follow-up.  Medications have been refractory to multiple other medications.    Other home care instructions discussed: Use pain medication only under direct supervision at the lowest possible dose needed to control your pain.   ED return instructions discussed: Patient was counseled on head injury precautions and symptoms that should indicate their return to the ED.  These include severe worsening headache, vision changes, confusion, loss of consciousness, trouble walking, nausea & vomiting, or weakness/tingling in extremities.    Follow-up instructions discussed: Patient encouraged to follow-up with their PCP in 3 days.                                Medical Decision Making Risk Prescription drug management.   In regards to the patient's headache, critical differentials were considered including subarachnoid hemorrhage, intracerebral hemorrhage, epidural/subdural hematoma, pituitary apoplexy, vertebral/carotid artery dissection, giant cell arteritis, central venous thrombosis, reversible cerebral vasoconstriction, acute angle closure glaucoma, idiopathic intracranial hypertension, bacterial meningitis, viral encephalitis, carbon monoxide poisoning, posterior reversible encephalopathy syndrome, pre-eclampsia.   Reg flag symptoms related to these causes were considered including systemic symptoms (fever, weight loss), neurologic symptoms (confusion, mental status change, vision change, associated seizure), acute or sudden "thunderclap" onset, patient age 16 or older with new or progressive headache, patient of any age with first headache or change in headache pattern, pregnant or postpartum status, history of HIV or other immunocompromise, history of cancer, headache occurring with exertion, associated neck or  shoulder pain, associated traumatic injury, concurrent use of anticoagulation, family history of spontaneous SAH, and concurrent drug use.    Other benign, more common causes of headache were considered  including migraine, tension-type headache, cluster headache, referred pain from other cause such as sinus infection, dental pain, trigeminal neuralgia.   On exam, patient has a reassuring neuro exam including baseline mental status, no significant neck pain or meningeal signs, no signs of severe infection or fever.   MRI performed 4/29 was reassuring.  Unfortunately headaches refractory to most treatments thus far.  I would complete treatment for sinus infection to least treat this possibility.  Encouraged neurology follow-up if not improving.  The patient's vital signs, pertinent lab work and imaging were reviewed and interpreted as discussed in the ED course. Hospitalization was considered for further testing, treatments, or serial exams/observation. However as patient is well-appearing, has a stable exam over the course of their evaluation, and reassuring studies today, I do not feel that they warrant admission at this time. This plan was discussed with the patient who verbalizes agreement and comfort with this plan and seems reliable and able to return to the Emergency Department with worsening or changing symptoms.          Final Clinical Impression(s) / ED Diagnoses Final diagnoses:  Acute nonintractable headache, unspecified headache type    Rx / DC Orders ED Discharge Orders          Ordered    metoCLOPramide  (REGLAN ) 10 MG tablet  Every 6 hours        01/29/24 1541    HYDROcodone -acetaminophen  (NORCO/VICODIN) 5-325 MG tablet  Every 6 hours PRN        01/29/24 1541              Lyna Sandhoff, PA-C 01/29/24 1545    Lyna Sandhoff, PA-C 01/29/24 1546    Scarlette Currier, MD 01/30/24 1416

## 2024-01-29 NOTE — ED Triage Notes (Signed)
 Patient she states she has been having headaches for three weeks but it is been worse the past two days and today. She denies any vision changes or sensitivity to light. She says it is in the forehead. She had an MRI two days ago and she said they it was sinus infection for which she is being treated for with antibiotics and prednisone .

## 2024-01-30 ENCOUNTER — Encounter (HOSPITAL_BASED_OUTPATIENT_CLINIC_OR_DEPARTMENT_OTHER): Payer: Self-pay

## 2024-01-30 ENCOUNTER — Other Ambulatory Visit: Payer: Self-pay | Admitting: Orthopedic Surgery

## 2024-01-30 ENCOUNTER — Emergency Department (HOSPITAL_BASED_OUTPATIENT_CLINIC_OR_DEPARTMENT_OTHER)
Admission: EM | Admit: 2024-01-30 | Discharge: 2024-01-30 | Disposition: A | Discharge status: Home / Self Care | Attending: Emergency Medicine | Admitting: Emergency Medicine

## 2024-01-30 ENCOUNTER — Emergency Department (HOSPITAL_BASED_OUTPATIENT_CLINIC_OR_DEPARTMENT_OTHER)

## 2024-01-30 ENCOUNTER — Other Ambulatory Visit: Payer: Self-pay

## 2024-01-30 DIAGNOSIS — Z043 Encounter for examination and observation following other accident: Secondary | ICD-10-CM | POA: Diagnosis not present

## 2024-01-30 DIAGNOSIS — Z7982 Long term (current) use of aspirin: Secondary | ICD-10-CM | POA: Diagnosis not present

## 2024-01-30 DIAGNOSIS — E119 Type 2 diabetes mellitus without complications: Secondary | ICD-10-CM | POA: Diagnosis not present

## 2024-01-30 DIAGNOSIS — M5021 Other cervical disc displacement,  high cervical region: Secondary | ICD-10-CM | POA: Diagnosis not present

## 2024-01-30 DIAGNOSIS — R519 Headache, unspecified: Secondary | ICD-10-CM | POA: Insufficient documentation

## 2024-01-30 DIAGNOSIS — G20A1 Parkinson's disease without dyskinesia, without mention of fluctuations: Secondary | ICD-10-CM | POA: Insufficient documentation

## 2024-01-30 DIAGNOSIS — M50221 Other cervical disc displacement at C4-C5 level: Secondary | ICD-10-CM | POA: Diagnosis not present

## 2024-01-30 DIAGNOSIS — M47812 Spondylosis without myelopathy or radiculopathy, cervical region: Secondary | ICD-10-CM | POA: Insufficient documentation

## 2024-01-30 DIAGNOSIS — G8929 Other chronic pain: Secondary | ICD-10-CM

## 2024-01-30 DIAGNOSIS — M542 Cervicalgia: Secondary | ICD-10-CM | POA: Diagnosis not present

## 2024-01-30 LAB — CBC
HCT: 37.2 % (ref 36.0–46.0)
Hemoglobin: 12.3 g/dL (ref 12.0–15.0)
MCH: 31.5 pg (ref 26.0–34.0)
MCHC: 33.1 g/dL (ref 30.0–36.0)
MCV: 95.1 fL (ref 80.0–100.0)
Platelets: 180 10*3/uL (ref 150–400)
RBC: 3.91 MIL/uL (ref 3.87–5.11)
RDW: 14.1 % (ref 11.5–15.5)
WBC: 5.9 10*3/uL (ref 4.0–10.5)
nRBC: 0 % (ref 0.0–0.2)

## 2024-01-30 LAB — BASIC METABOLIC PANEL WITH GFR
Anion gap: 10 (ref 5–15)
BUN: 34 mg/dL — ABNORMAL HIGH (ref 8–23)
CO2: 29 mmol/L (ref 22–32)
Calcium: 9.3 mg/dL (ref 8.9–10.3)
Chloride: 101 mmol/L (ref 98–111)
Creatinine, Ser: 1.51 mg/dL — ABNORMAL HIGH (ref 0.44–1.00)
GFR, Estimated: 36 mL/min — ABNORMAL LOW (ref >60)
Glucose, Bld: 116 mg/dL — ABNORMAL HIGH (ref 70–99)
Potassium: 5.5 mmol/L — ABNORMAL HIGH (ref 3.5–5.1)
Sodium: 140 mmol/L (ref 135–145)

## 2024-01-30 MED ORDER — LIDOCAINE 5 % EX PTCH
1.0000 | MEDICATED_PATCH | CUTANEOUS | Status: DC
  Administered 2024-01-30: 1 via TRANSDERMAL
  Filled 2024-01-30: qty 1

## 2024-01-30 MED ORDER — SODIUM CHLORIDE 0.9 % IV BOLUS: 1000.0000 mL | Freq: Once | INTRAVENOUS | Status: DC

## 2024-01-30 MED ORDER — LIDOCAINE 5 % EX PTCH: 1.0000 | MEDICATED_PATCH | CUTANEOUS | Status: DC

## 2024-01-30 MED ORDER — KETOROLAC TROMETHAMINE 15 MG/ML IJ SOLN
15.0000 mg | Freq: Once | INTRAMUSCULAR | Status: DC
  Filled 2024-01-30: qty 1

## 2024-01-30 MED ORDER — DIPHENHYDRAMINE HCL 50 MG/ML IJ SOLN
12.5000 mg | Freq: Once | INTRAMUSCULAR | Status: AC
  Administered 2024-01-30: 12.5 mg via INTRAVENOUS
  Filled 2024-01-30: qty 1

## 2024-01-30 MED ORDER — PROCHLORPERAZINE EDISYLATE 10 MG/2ML IJ SOLN
10.0000 mg | Freq: Once | INTRAMUSCULAR | Status: AC
  Administered 2024-01-30: 10 mg via INTRAVENOUS
  Filled 2024-01-30: qty 2

## 2024-01-30 MED ORDER — MAGNESIUM SULFATE 2 GM/50ML IV SOLN
2.0000 g | Freq: Once | INTRAVENOUS | Status: DC
Start: 2024-01-30 — End: 2024-01-30
  Filled 2024-01-30: qty 50

## 2024-01-30 NOTE — Discharge Instructions (Addendum)
 Call Pcp for kidney function recheck in 2-3 days and neurology for follow up. Imaging and workup overall reassuring here. Return with new or worsening symptoms. Make sure you are staying well hydrated.

## 2024-01-30 NOTE — ED Triage Notes (Signed)
 States was here yesterday for headache which has continued.  Also states having swelling mid upper back noticed last night

## 2024-01-30 NOTE — ED Provider Notes (Signed)
 [place] [facility]GENC[facility] DEPARTMENT AT [facility] Provider Note   CSN: [id number] Arrival date & time: 01/30/24  1359     History  Chief Complaint  Patient presents with   Headache   swelling upper back    Kristin Gilmore is a 73 y.o. female.  With past medical history of intractable migraine without aura, tremor, pituitary adenoma, Parkinson's disease, chronic fatigue, chronic pain syndrome, diabetes is presenting to the emergency room with complaint of headache. Patient reports that she was seen here yesterday for this, but it is getting worse.  Patient reports that she has had a headache for approximately 3 weeks that is constant and seems to be getting worse.  She locates headache to forehead. No injury, trauma or fall. No blurry vision, change in vision, no difficulty finding works, no focal weakness.  She reports that she has been having some chronic neck pain but yesterday when she felt the back of her neck she felt like the area was swollen and painful.  She denies any fever or neck rigidity. Tired Norco for this, not helping.    Headache      Home Medications Prior to Admission medications   Medication Sig Start Date End Date Taking? Authorizing Provider  acetaminophen  (TYLENOL ) 500 MG tablet Take 1,000 mg by mouth every 6 (six) hours as needed for mild pain or moderate pain.    [provider]  amitriptyline  (ELAVIL ) 10 MG tablet Take 1 tablet (10 mg total) by mouth at bedtime as needed for sleep. Patient taking differently: Take 25 mg by mouth at bedtime. 06/08/22   Etter Hermann., MD  amLODipine  (NORVASC ) 5 MG tablet Take 5 mg by mouth 2 (two) times daily. 07/27/18   [provider]  aspirin  EC 81 MG tablet Take 81 mg by mouth daily. Swallow whole.    [provider]  Azelastine HCl 137 MCG/SPRAY SOLN Place into both nostrils. AS DIRECTED 01/08/24   [provider]  bisacodyl  (DULCOLAX) 5 MG EC tablet Take 5-15 mg by  mouth at bedtime as needed (constipation).    [provider]  BLACK COHOSH PO Take 1 tablet by mouth daily.    [provider]  Calcium  Citrate-Vitamin D  (CALCIUM  CITRATE + D PO) Take 1-2 capsules by mouth See admin instructions. Calcium  200 mg, vitamin D3 2.5 mcg - 100 units - take 1 tablet by mouth with breakfast and 2 tablets with supper    [provider]  carbidopa-levodopa (SINEMET IR) 25-100 MG tablet Take 1 tablet by mouth 4 (four) times daily. 12/25/22   [provider]  carvedilol  (COREG ) 12.5 MG tablet Take 12.5 mg by mouth 2 (two) times daily. 03/24/23   [provider]  Cholecalciferol  (VITAMIN D3) 50 MCG (2000 UT) capsule Take 2,000 Units by mouth daily.    [provider]  Coenzyme Q10 (COQ10) 100 MG CAPS Take 100 mg by mouth daily with supper.    [provider]  divalproex  (DEPAKOTE  ER) 250 MG 24 hr tablet Take 1 tablet (250 mg total) by mouth 2 (two) times daily. 01/19/24   Cottle, Kennedy Peabody., MD  docusate sodium  (COLACE) 100 MG capsule Take 200-300 mg by mouth at bedtime.    [provider]  doxycycline (VIBRA-TABS) 100 MG tablet Take 100 mg by mouth 2 (two) times daily. 01/13/24   [provider]  Homeopathic Products [facility] RELIEF EX) Apply 1 application  topically daily as needed (Knee pain).  [provider]  HYDROcodone -acetaminophen  (NORCO/VICODIN) 5-325 MG tablet Take 1 tablet by mouth every 6 (six) hours as needed for severe pain (pain score 7-10). 01/29/24   Geiple, Joshua, PA-C  hyoscyamine (ANASPAZ) 0.125 MG TBDP disintergrating tablet Take 0.125 mg by mouth daily as needed (Stomach pain). 03/06/23   [provider]  ibuprofen  (ADVIL ) 200 MG tablet Take 400 mg by mouth daily as needed for headache.    [provider]  loperamide  (IMODIUM ) 2 MG capsule Take 2 mg by mouth 4 (four) times daily as needed for diarrhea or loose stools.    [provider]   MAGNESIUM  CITRATE PO Take 150 mg by mouth at bedtime.    [provider]  Magnesium  Oxide 500 MG TABS Take 500 mg by mouth at bedtime.    [provider]  Menthol , Topical Analgesic, (BIOFREEZE) 4 % GEL Apply 1 application  topically 4 (four) times daily as needed (pain).    [provider]  metoCLOPramide  (REGLAN ) 10 MG tablet Take 1 tablet (10 mg total) by mouth every 6 (six) hours. 01/29/24   Geiple, Joshua, PA-C  nitroGLYCERIN  (NITROSTAT ) 0.4 MG SL tablet Place 1 tablet (0.4 mg total) under the tongue every 5 (five) minutes as needed for chest pain. 08/14/23 11/12/23  Tobb, Kardie, DO  Omega-3 Fatty Acids (FISH OIL) 500 MG CAPS Take 500 mg by mouth 2 (two) times daily. Super    [provider]  OVER THE COUNTER MEDICATION Take 50 mg by mouth 2 (two) times daily. PETADOLEX - Health brain blood vessel relaxation (butterburr root extract)    [provider]  OVER THE COUNTER MEDICATION Apply 1 application  topically daily as needed (Pain). Bengay    [provider]  pregabalin  (LYRICA ) 75 MG capsule TAKE 1 CAPSULE BY MOUTH THREE TIMES A DAY 11/26/23   Diedra Fowler, MD  Red Yeast Rice 600 MG CAPS Take 1,200 mg by mouth 2 (two) times daily with a meal.    [provider]  rosuvastatin (CRESTOR) 20 MG tablet Take 20 mg by mouth every Monday, Wednesday, and Friday. Dinner 05/01/23   [provider]  telmisartan (MICARDIS) 80 MG tablet Take 80 mg by mouth at bedtime.    [provider]  Turmeric 500 MG CAPS Take 500 mg by mouth 2 (two) times daily with a meal. extract    [provider]      Allergies    Levofloxacin, Amoxicillin, Clarithromycin, and Atropine    Review of Systems   Review of Systems  Neurological:  Positive for headaches.    Physical Exam Updated Vital Signs BP (!) 132/103 (BP Location: Right Arm)   Pulse 66   Temp 97.7 F (36.5 C)   Resp 18   Ht 5\' 7"  (1.702 m)   Wt 86.2 kg   SpO2  97%   BMI 29.76 kg/m  Physical Exam Vitals and nursing note reviewed.  Constitutional:      General: She is not in acute distress.    Appearance: She is not toxic-appearing.  HENT:     Head: Normocephalic and atraumatic.  Eyes:     General: No scleral icterus.    Conjunctiva/sclera: Conjunctivae normal.  Neck:     Comments: Reports midline tenderness to around C6-7.  No obvious step-off, deformity or swelling. Normal ROM, no neck rigidity, no radicular symptoms.  Cardiovascular:     Rate and Rhythm: Normal rate and regular rhythm.     Pulses: Normal  pulses.     Heart sounds: Normal heart sounds.  Pulmonary:     Effort: Pulmonary effort is normal. No respiratory distress.     Breath sounds: Normal breath sounds.  Abdominal:     General: Abdomen is flat. Bowel sounds are normal.     Palpations: Abdomen is soft.     Tenderness: There is no abdominal tenderness.  Skin:    General: Skin is warm and dry.     Findings: No lesion.  Neurological:     General: No focal deficit present.     Mental Status: She is alert and oriented to person, place, and time. Mental status is at baseline.     Comments: Was equal reactive to light.  No nystagmus.  No visual field deficit.  Alert and oriented with no slurred speech.  No facial droop or sensation change.  Does have tremor of upper and lower extremity.  Otherwise strength and sensation intact with strong radial pulse.     ED Results / Procedures / Treatments   Labs (all labs ordered are listed, but only abnormal results are displayed) Labs Reviewed  CBC  BASIC METABOLIC PANEL WITH GFR    EKG None  Radiology CT Cervical Spine Wo Contrast Result Date: 01/30/2024 CLINICAL DATA:  Fall EXAM: CT CERVICAL SPINE WITHOUT CONTRAST TECHNIQUE: Multidetector CT imaging of the cervical spine was performed without intravenous contrast. Multiplanar CT image reconstructions were also generated. RADIATION DOSE REDUCTION: This exam was performed  according to the departmental dose-optimization program which includes automated exposure control, adjustment of the mA and/or kV according to patient size and/or use of iterative reconstruction technique. COMPARISON:  None Available. FINDINGS: Alignment: Normal. Skull base and vertebrae: No acute fracture. No primary bone lesion or focal pathologic process. Soft tissues and spinal canal: No prevertebral fluid or swelling. No visible canal hematoma. Disc levels: There is disc space narrowing at C5-C6 and C6-C7 with endplate osteophyte formation. C2-C3: There is bilateral facet arthropathy. There is no significant central canal or neural foraminal stenosis. C3-C4: There is disc bulge causing mild central canal stenosis. There is mild left neural foraminal stenosis secondary to facet arthropathy. C4-C5: Mild broad-based disc bulge. No central canal or neural foraminal stenosis. C5-C6: Posterior disc osteophyte complex and uncovertebral spurring. Moderate right neural foraminal stenosis. Mild central canal stenosis. C6-C7: No central canal or neural foraminal stenosis. Upper chest: Negative. Other: None. IMPRESSION: 1. No acute fracture or traumatic subluxation of the cervical spine. 2. Multilevel degenerative changes of the cervical spine as described above. Electronically Signed   By: Tyron Gallon M.D.   On: 01/30/2024 16:06   CT HEAD WO CONTRAST Result Date: 01/30/2024 CLINICAL DATA:  Headache EXAM: CT HEAD WITHOUT CONTRAST TECHNIQUE: Contiguous axial images were obtained from the base of the skull through the vertex without intravenous contrast. RADIATION DOSE REDUCTION: This exam was performed according to the departmental dose-optimization program which includes automated exposure control, adjustment of the mA and/or kV according to patient size and/or use of iterative reconstruction technique. COMPARISON:  MRI brain 01/27/2024. FINDINGS: Brain: No evidence of acute infarction, hemorrhage, hydrocephalus,  extra-axial collection or mass lesion/mass effect. There is mild periventricular white matter hypodensity. Vascular: No hyperdense vessel or unexpected calcification. Skull: Normal. Negative for fracture or focal lesion. Sinuses/Orbits: No acute finding. Patient is status post sinonasal surgery bilaterally. There is mild mucosal thickening of the left maxillary sinus. Other: None. IMPRESSION: 1. No acute intracranial process. 2. Mild periventricular white matter hypodensity, nonspecific but can be  seen in the setting of chronic microvascular ischemia. Electronically Signed   By: Tyron Gallon M.D.   On: 01/30/2024 16:02    Procedures Procedures    Medications Ordered in ED Medications  prochlorperazine  (COMPAZINE ) injection 10 mg (10 mg Intravenous Given 01/30/24 1555)  diphenhydrAMINE  (BENADRYL ) injection 12.5 mg (12.5 mg Intravenous Given 01/30/24 1554)    ED Course/ Medical Decision Making/ A&P Clinical Course as of 01/30/24 1635  Fri Jan 30, 2024  1612 Patient is screaming in room that IV is painful requesting it is taken out now, reports she is feeling worse after migraine cocktail and wants new medications because she still has headache. Will add Mag and Lidoderm  patch.  [JB]  1626 GFR, Estimated(!): 36 Stage 3, comparable to labs 6 months ago.  [JB]  1633 Refusing repeat IV, requesting d/c dose not want more treatment, symptoms have resolved.  [JB]    Clinical Course User Index [JB] Javonna Balli, Kandace Organ, PA-C                                 Medical Decision Making Amount and/or Complexity of Data Reviewed Labs: ordered. Decision-making details documented in ED Course. Radiology: ordered.  Risk Prescription drug management.   Kristin Gilmore 73 y.o. presented today for HA. Working Ddx: tension headache, migraine, intracranial mass, intracranial hemorrhage, intracranial infection including meningitis vs encephalitis, trigeminal neuralgia, AVM, sinusitis, cerebral aneurysm,  muscular headache, cavernous sinus thrombosis, carotid artery dissection.  R/o DDx: intracranial mass, hemorrhage,or infection including meningitis vs encephalitis, trigeminal neuralgia, AVM, cerebral aneurysm, muscular headache, cavernous sinus thrombosis, carotid artery dissection are less likely due to history of present illness, physical exam, labs/imaging findings  PMHX: ntractable migraine without aura, tremor, pituitary adenoma, Parkinson's disease, chronic fatigue, chronic pain syndrome, diabetes  Review of prior external notes: 01/29/24 Ed visit for similar   Unique Tests and My Interpretation:  CBC: no leukocytosis, no anemia  BMP: normal kidney function, no electrolyte abn   CT Head & neck w/o Contrast: no acute finding   Problem List / ED Course / Critical interventions / Medication management  Patient reported to emergency room with headache.  Locates headache to bilateral forehead.  No aura associated with headache.  No focal deficit.  Hemodynamically stable well-appearing.  She does report that her headache is significantly worsened since she was here yesterday.  Denies any injury trauma or fall she is not on blood thinner.  This headache was not sudden onset or severe no associated with fever or IV drug use.  She has no meningeal signs.  She does have midline tenderness to middle of neck with no radicular symptoms.  Given presentation will obtain CT scan of head and neck to rule out any acute pathology check basic labs give migraine cocktail.  She does have follow-up with neurology.  Scans without acute findings. Has follow up, is stable for discharge.  I ordered medication including migraine cocktail.  Reevaluation of the patient after these medicines showed that the patient stayed the same Patients vitals assessed. Upon arrival patient is hemodynamically stable.  I have reviewed the patients home medicines and have made adjustments as needed    Plan: F/u w/ PCP in 2-3d to  ensure resolution of sx.  Patient was given return precautions. Patient stable for discharge at this time.  Patient educated on sx/dx and verbalized understanding of plan. Return to ED if new or worsening sx.  Final Clinical Impression(s) / ED Diagnoses Final diagnoses:  Bad headache  Spondylosis of cervical region without myelopathy or radiculopathy    Rx / DC Orders ED Discharge Orders     None         Eudora Heron, PA-C 01/30/24 1635    Albertus Hughs, DO 02/02/24 0700

## 2024-02-10 ENCOUNTER — Encounter: Admitting: Sports Medicine

## 2024-02-12 ENCOUNTER — Other Ambulatory Visit: Payer: Self-pay

## 2024-02-12 ENCOUNTER — Ambulatory Visit: Admitting: Sports Medicine

## 2024-02-12 ENCOUNTER — Encounter: Payer: Self-pay | Admitting: Sports Medicine

## 2024-02-12 DIAGNOSIS — G8929 Other chronic pain: Secondary | ICD-10-CM

## 2024-02-12 DIAGNOSIS — R200 Anesthesia of skin: Secondary | ICD-10-CM | POA: Diagnosis not present

## 2024-02-12 DIAGNOSIS — M533 Sacrococcygeal disorders, not elsewhere classified: Secondary | ICD-10-CM

## 2024-02-12 DIAGNOSIS — R202 Paresthesia of skin: Secondary | ICD-10-CM

## 2024-02-12 DIAGNOSIS — M7918 Myalgia, other site: Secondary | ICD-10-CM

## 2024-02-12 DIAGNOSIS — M4316 Spondylolisthesis, lumbar region: Secondary | ICD-10-CM | POA: Diagnosis not present

## 2024-02-12 DIAGNOSIS — G20B1 Parkinson's disease with dyskinesia, without mention of fluctuations: Secondary | ICD-10-CM

## 2024-02-12 NOTE — Progress Notes (Signed)
 Kristin Gilmore - 73 y.o. female MRN [id number]  Date of birth: [birth year]  Office Visit Note: Visit Date: 02/12/2024 PCP: Mordechai April, DO Referred by: Diedra Fowler, MD  Subjective: Chief Complaint  Patient presents with   Lower Back - Pain   HPI: Kristin Gilmore is a pleasant 73 y.o. female who presents today for chronic low back pain with spondylisthesis and R-SI joint and buttock pain.  She has been seeking a second surgical opinion at [facility] for her back.  They were discussing surgery versus trialing an injection.  She comes here today to consider an injection and discuss next steps.  She has pain over the posterior aspect of the back that does wrap around the lateral leg.  She also has chronic numbness and tingling in the right hand that she states has been present since she had a PICC line for sepsis back in 2023.  She is on Lyrica  75 mg 3 times daily.   In the past, we did proceed with ultrasound-guided right SI joint injection back in August 2024 which gave her good relief for months.  Pertinent ROS were reviewed with the patient and found to be negative unless otherwise specified above in HPI.   Assessment & Plan: Visit Diagnoses:  1. Spondylolisthesis, lumbar region   2. Chronic right SI joint pain   3. Pain in right buttock   4. Numbness and tingling in right hand   5. Parkinson's disease with dyskinesia, unspecified whether manifestations fluctuate (HCC)    Plan: Impression is chronic low back pain with spondylolisthesis as well as degenerative scoliosis.  This is in the setting of a previous TLIF from L3-L5.  She also is having reciprocal right SI joint pain.  Through shared decision-making, we did proceed with diagnostic and hopefully therapeutic right SI joint injection, as patient has received months of relief from this in the past.  She tolerated well today.  Advised on postinjection protocol.  She may continue her oral diclofenac  75 mg twice daily.  We  will see to what degree this settles down her back pain as well.  She will continue following up with [facility] for second opinion nonsurgical management.  In terms of her numbness and tingling of the right hand, this is chronic from her previous sepsis infection.  I do not think this is carpal tunnel related, she will continue her Lyrica  75 mg 3 times daily.  She does have Parkinson's disease which is affecting her gait as well.  She will follow up with me as needed, otherwise we will continue her back care with either Dr. Sulema Endo or [facility].  Follow-up: Return if symptoms worsen or fail to improve.   Meds & Orders: No orders of the defined types were placed in this encounter.   Orders Placed This Encounter  Procedures   US  Guided Needle Placement - No Linked Charges     Procedures: U/S-guided SI-joint injection, Right   After discussion of risk/benefits/indications, informed verbal consent was obtained. A timeout was then performed. The patient was positioned in a prone position on exam room table with a pillow placed under the pelvis for mild hip flexion. The SI joint area was cleaned and prepped with betadine  and alcohol swabs . Sterile ultrasound gel was applied and the ultrasound transducer was placed in an anatomic axial plane over the PSIS, then moved distally over the SI-joint. Using ultrasound guidance, a 22-gauge, 3.5" needle was inserted from a medial to lateral approach utilizing an in-plane  approach and directed into the SI-joint. The SI-joint was then injected with a mixture of 4:2 lidocaine :depomedrol with visualization of the injectate flow into the SI-joint under ultrasound visualization. The patient tolerated the procedure well without immediate complications.       Clinical History: MRI LUMBAR SPINE WITHOUT AND WITH CONTRAST    CONTRAST:  10mL GADAVIST  GADOBUTROL  1 MMOL/ML IV SOLN   COMPARISON:  CT lumbar spine 04/22/2022, lumbar spine MRI 07/18/2021    FINDINGS: Segmentation: Standard; the lowest formed disc space is designated L5-S1.   Alignment: Levocurvature centered at L2 is unchanged. Trace anterolisthesis of L3 on L4 and 5 mm anterolisthesis of L4 on L5 is unchanged.   Vertebrae: Postsurgical changes reflecting posterior instrumented fusion at L3 through L5 with interbody spacers are again seen. Lucency around the L5 screws seen on the prior CT is not appreciated by MRI.   Background marrow signal is within normal limits. There is degenerative endplate marrow signal abnormality with edema but no enhancement at L2-L3 which is increased since the preoperative study from 2022. There is no other marrow edema. There is no abnormal marrow enhancement.   Conus medullaris and cauda equina: Conus extends to the L1 level. Conus and cauda equina appear normal. There is no abnormal enhancement of the conus or cauda equina nerve roots.   Paraspinal and other soft tissues: There are postsurgical changes in the soft tissues posterior to the surgical levels. There is no suspicious fluid collection. The endometrium is distended measuring up to 8 mm in thickness.   Disc levels:   T12-L1: No significant spinal canal or neural foraminal stenosis   L1-L2: No significant spinal canal or neural foraminal stenosis.   L2-L3: There is disc desiccation and narrowing with a diffuse disc bulge and small superiorly migrated extrusion, and moderate bilateral facet arthropathy resulting in moderate spinal canal stenosis with subarticular zone narrowing and possible irritation of either traversing L3 nerve root, and no significant neural foraminal stenosis. Compared to the preoperative study from 2022, the spinal canal stenosis has worsened.   L3-L4: Status post posterior instrumented fusion and decompression. There is no residual spinal canal stenosis. There is mild residual right worse than left neural foraminal stenosis without  convincing evidence of nerve root compression.   L4-L5: Status post posterior instrumented fusion and decompression. There is grade 1 anterolisthesis with uncovering of the disc posteriorly and bilateral facet arthropathy. There is no residual spinal canal stenosis; however, there is severe left and mild-to-moderate right neural foraminal stenosis with compression of the exiting left L4 nerve root (5-11).   L5-S1: There is advanced disc desiccation and narrowing with a disc bulge and central protrusion and annular fissure, moderate bilateral facet arthropathy resulting in severe left worse than right neural foraminal stenosis without significant spinal canal stenosis. There is no evidence of compression of the traversing S1 nerve roots as was questioned on prior CT.   IMPRESSION: 1. Status post posterior instrumented fusion and decompression at L3 through L5 without residual spinal canal stenosis at the surgical levels; however, there is severe left and mild-to-moderate right neural foraminal stenosis at L4-L5 with compression of the exiting left L4 nerve root. No other convincing nerve root compression at the surgical levels. 2. 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 3. Unchanged advanced disc degeneration and moderate facet arthropathy at L5-S1 with severe left  worse than right neural foraminal stenosis. No evidence of compression of the traversing S1 nerve roots as was questioned on prior CT. 4. Distended endometrium measuring up to 8 mm. Recommend correlation with pelvic ultrasound given patient age.     Electronically Signed   By: Eldora Greet M.D.   On: 05/30/2022 11:16  She reports that she has never smoked. She has never used smokeless tobacco. No results for input(s):  "HGBA1C", "LABURIC" in the last 8760 hours.  Objective:    Physical Exam  Gen: Well-appearing, in no acute distress; non-toxic CV: Well-perfused. Warm.  Resp: Breathing unlabored on room air; no wheezing.  *She does have a resting tremor, likely secondary to her Parkinson's  Ortho Exam - Lumbar/SI: The patient is able to walk limitedly, ambulates more so in a wheelchair during the visit today.  There is positive TTP over the right SI joint region.  She does have a well-healed scar down the posterior spine from the L3-S1 level.  Positive Faber, positive SI joint compression test on the right.  - Right hand: There is good color in the hands, she is able to perform thumb to each finger dexterity without issues.  Cap refill less than 2 seconds.  Imaging:  *Scoliosis XRs from 11/13/2023 were independently reviewed and interpreted, showing T1 pelvis angle of 18 degrees. SVA of 16cm. PI of 40, LL of 30. Disc height loss at L2/3 and L5/S1. Posterior instrumentation with TLIF from L3-L5. Coronal lumbar curvature that measures 25 degrees with apex to the left. Coronal imbalance on 3.2cm. No fracture or dislocation seen.     MRI of the lumbar spine from 05/30/2022 was previously independently reviewed and interpreted, showing L2/3 lateral recess and central stenosis. Foraminal stenosis at L4/5 and L5/S1. Central disc herniation at L5/S1.   Past Medical/Family/Surgical/Social History: Medications & Allergies reviewed per EMR, new medications updated. Patient Active Problem List   Diagnosis Date Noted   Pain due to onychomycosis of toenails of both feet 06/09/2023   Dermatitis 06/09/2023   Lipodermatosclerosis 05/22/2023   SK (seborrheic keratosis) 05/22/2023   Acute pain of right shoulder 07/02/2022   Lumbar radicular pain    AKI (acute kidney injury) (HCC)    Bursitis of right elbow    Bacteremia due to methicillin susceptible Staphylococcus aureus (MSSA) 05/29/2022   SIRS (systemic  inflammatory response syndrome) (HCC) 05/28/2022   Bipolar disorder, in partial remission, most recent episode hypomanic (HCC) 05/28/2022   Hyperkalemia 05/28/2022   Dehydration 05/28/2022   Essential hypertension 05/28/2022   Intractable back pain 05/27/2022   Other spondylosis with radiculopathy, lumbar region 10/30/2021   Other secondary scoliosis, lumbar region 10/30/2021   Spondylolisthesis, lumbar region 10/30/2021   Spondylolisthesis of lumbosacral region 10/30/2021   Status post total right knee replacement 06/26/2021   Primary osteoarthritis of right knee 06/25/2021   Parkinson's disease (HCC) 01/24/2021   Type 2 diabetes mellitus with diabetic mononeuropathy, without long-term current use of insulin  (HCC) 10/23/2020   Trochanteric bursitis, left hip 01/18/2020   Lumbar spinal stenosis 12/19/2017   Pain in joint, ankle and foot 05/02/2016   Chronic venous insufficiency 05/02/2016   Chronic fatigue 02/14/2016   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   Atypical nevi 02/14/2016   Hyperglycemia 02/14/2016   Hematuria 02/14/2016   Neck pain 02/14/2016   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   Peripheral edema 11/01/2015   Chronic pain syndrome 06/30/2015   Intractable chronic migraine without aura 11/02/2014   Tremor 04/13/2014   Migraine without aura 04/13/2014  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   Pneumonia    Pre-diabetes    "i was told i was pre-diabetic a while ago"   Tremor 04/13/2014   Family History  Problem Relation Age of Onset   Cancer Father        stomach cancer    Migraines Mother    Past Surgical History:  Procedure Laterality Date   CHOLECYSTECTOMY     COLONOSCOPY W/ POLYPECTOMY     LUMBAR LAMINECTOMY  07/2020   3 level decompression - [place], [place]   LUMBAR LAMINECTOMY/DECOMPRESSION MICRODISCECTOMY N/A 12/19/2017   Procedure: LAMINECTOMY LUMBAR TWO- LUMBAR THREE, LUMBAR THREE- LUMBAR FOUR, LUMBAR FOUR- LUMBAR FIVE ;  Surgeon: Augusto Blonder, MD;  Location: [facility] OR;  Service: Neurosurgery;  Laterality: N/A;   NASAL SINUS SURGERY     RADIOLOGY WITH ANESTHESIA N/A 05/30/2022   Procedure: MRI WITH ANESTHESIA LUMBAR SPINE W/WO CONSTRAST;  Surgeon: Radiologist, Medication, MD;  Location: [facility] OR;  Service: Radiology;  Laterality: N/A;   TEE WITHOUT CARDIOVERSION N/A 06/05/2022   Procedure: TRANSESOPHAGEAL ECHOCARDIOGRAM (TEE);  Surgeon: Hazle Lites, MD;  Location: [facility] ENDOSCOPY;  Service: Cardiovascular;  Laterality: N/A;   TONSILLECTOMY     TOTAL KNEE ARTHROPLASTY Right 06/26/2021   Procedure: RIGHT TOTAL KNEE ARTHROPLASTY;  Surgeon: Wes Hamman, MD;  Location: [facility] OR;  Service: Orthopedics;  Laterality: Right;   WISDOM TOOTH EXTRACTION     Social History   Occupational History   Occupation: Retired  Tobacco Use   Smoking status: Never   Smokeless tobacco: Never  Vaping Use   Vaping status: Never Used  Substance and Sexual Activity   Alcohol use: Not Currently   Drug use: No   Sexual activity: Not on file

## 2024-02-13 ENCOUNTER — Telehealth: Payer: Self-pay | Admitting: Cardiology

## 2024-02-13 DIAGNOSIS — Z01818 Encounter for other preprocedural examination: Secondary | ICD-10-CM | POA: Diagnosis not present

## 2024-02-13 NOTE — Telephone Encounter (Signed)
 Caller Rice Chamorro at [facility]) wants a call back to patient regarding the status of her clearance.  Caller stated patient will need surgical clearance forms completed.

## 2024-02-13 NOTE — Telephone Encounter (Signed)
 Left message for Rice Chamorro with [facility], that we do not have a clearance request from their office for the pt. Please fax ASAP to [phone] attn: preop team

## 2024-02-13 NOTE — Telephone Encounter (Signed)
 YouJust now (4:58 PM)    Rice Chamorro from [facility] called back though I was unavailable. I called Rice Chamorro back who explained to me that that she is the pt's PCP office and she was calling about a clearance form that the pt walked into their office for the surgery that we cleared the pt for 12/2023. Rice Chamorro, stated they saw the pt today and were drawing labs today and she can fax us  the clearance form to us  on Monday when the lab results are in. I stated that I am not sure that we need the form that we have already faxed the clearance notes to Dr. Pryor Browning at [facility] on 01/15/24 from Charles Connor, NP.    I stated to Rice Chamorro that we do not keep the clearance forms from request as we have a format for the preop team and the cardiologist for continuity. I did tell her that is fine she can send us  the form the pt brought in today at her appt with PCP. I will update the surgeon's office as well of this conversation.

## 2024-02-13 NOTE — Telephone Encounter (Signed)
 Kristin Gilmore from [facility] called back though I was unavailable. I called Kristin Gilmore back who explained to me that that she is the pt's PCP office and she was calling about a clearance form that the pt walked into their office for the surgery that we cleared the pt for 12/2023. Kristin Gilmore, stated they saw the pt today and were drawing labs today and she can fax us  the clearance form to us  on Monday when the lab results are in. I stated that I am not sure that we need the form that we have already faxed the clearance notes to Dr. Pryor Browning at [facility] on 01/15/24 from Charles Connor, NP.   I stated to Kristin Gilmore that we do not keep the clearance forms from request as we have a format for the preop team and the cardiologist for continuity. I did tell her that is fine she can send us  the form the pt brought in today at her appt with PCP. I will update the surgeon's office as well of this conversation.

## 2024-02-17 DIAGNOSIS — M25561 Pain in right knee: Secondary | ICD-10-CM | POA: Diagnosis not present

## 2024-02-19 ENCOUNTER — Telehealth: Payer: Self-pay | Admitting: Sports Medicine

## 2024-02-19 NOTE — Telephone Encounter (Signed)
 Patient called. She would like to know how long it will take before she has pain relief? Her cb# [phone]

## 2024-02-19 NOTE — Telephone Encounter (Signed)
 Called patient and reached answering machine. Injections can sometimes take up to 2 weeks to fully kick in.

## 2024-02-26 DIAGNOSIS — G4486 Cervicogenic headache: Secondary | ICD-10-CM | POA: Diagnosis not present

## 2024-02-26 DIAGNOSIS — M542 Cervicalgia: Secondary | ICD-10-CM | POA: Diagnosis not present

## 2024-02-26 DIAGNOSIS — Z1330 Encounter for screening examination for mental health and behavioral disorders, unspecified: Secondary | ICD-10-CM | POA: Diagnosis not present

## 2024-02-26 DIAGNOSIS — G43701 Chronic migraine without aura, not intractable, with status migrainosus: Secondary | ICD-10-CM | POA: Diagnosis not present

## 2024-03-02 ENCOUNTER — Ambulatory Visit: Admitting: Podiatry

## 2024-03-02 ENCOUNTER — Other Ambulatory Visit: Payer: Self-pay | Admitting: Psychiatry

## 2024-03-02 DIAGNOSIS — F3171 Bipolar disorder, in partial remission, most recent episode hypomanic: Secondary | ICD-10-CM

## 2024-03-03 DIAGNOSIS — M4802 Spinal stenosis, cervical region: Secondary | ICD-10-CM | POA: Diagnosis not present

## 2024-03-03 DIAGNOSIS — R52 Pain, unspecified: Secondary | ICD-10-CM | POA: Diagnosis not present

## 2024-03-03 DIAGNOSIS — M47812 Spondylosis without myelopathy or radiculopathy, cervical region: Secondary | ICD-10-CM | POA: Diagnosis not present

## 2024-03-09 DIAGNOSIS — M47812 Spondylosis without myelopathy or radiculopathy, cervical region: Secondary | ICD-10-CM | POA: Diagnosis not present

## 2024-03-09 DIAGNOSIS — G894 Chronic pain syndrome: Secondary | ICD-10-CM | POA: Diagnosis not present

## 2024-03-11 ENCOUNTER — Encounter: Payer: Self-pay | Admitting: Podiatry

## 2024-03-11 ENCOUNTER — Ambulatory Visit: Admitting: Podiatry

## 2024-03-11 DIAGNOSIS — E1141 Type 2 diabetes mellitus with diabetic mononeuropathy: Secondary | ICD-10-CM | POA: Diagnosis not present

## 2024-03-11 DIAGNOSIS — B351 Tinea unguium: Secondary | ICD-10-CM

## 2024-03-11 DIAGNOSIS — M79674 Pain in right toe(s): Secondary | ICD-10-CM | POA: Diagnosis not present

## 2024-03-11 DIAGNOSIS — M79675 Pain in left toe(s): Secondary | ICD-10-CM

## 2024-03-11 NOTE — Progress Notes (Signed)
 Surgery orders requested via Epic inbox.

## 2024-03-11 NOTE — Progress Notes (Signed)
 This patient returns to my office for at risk foot care.  This patient requires this care by a professional since this patient will be at risk due to having diabetes and Parkinsons. This patient is unable to cut nails herself since the patient cannot reach hernails.These nails are painful walking and wearing shoes.  This patient presents for at risk foot care today.  General Appearance  Alert, conversant and in no acute stress.  Vascular  Dorsalis pedis and posterior tibial  pulses are palpable  bilaterally.  Capillary return is within normal limits  bilaterally. Temperature is within normal limits  bilaterally.  Neurologic  Senn-Weinstein monofilament wire test within normal limits  bilaterally. Muscle power within normal limits bilaterally.  Nails Thick disfigured discolored nails with subungual debris  from hallux to fifth toes bilaterally. No evidence of bacterial infection or drainage bilaterally.  Orthopedic  No limitations of motion  feet .  No crepitus or effusions noted.  No bony pathology or digital deformities noted.  Skin  normotropic skin with no porokeratosis noted bilaterally.  No signs of infections or ulcers noted.     Onychomycosis  Pain in right toes  Pain in left toes    Consent was obtained for treatment procedures.   Mechanical debridement of nails 1-5  bilaterally performed with a nail nipper.  Filed with dremel without incident.    Return office visit    10  weeks.                 Told patient to return for periodic foot care and evaluation due to potential at risk complications.   Helane Gunther DPM

## 2024-03-16 DIAGNOSIS — M25561 Pain in right knee: Secondary | ICD-10-CM | POA: Diagnosis not present

## 2024-03-16 NOTE — Progress Notes (Signed)
 Second request for pre op orders: Spoke with [employer].

## 2024-03-17 NOTE — Progress Notes (Signed)
 COVID Vaccine Completed:  Date of COVID positive in last 90 days:  PCP - Mordechai April, DO Cardiologist - Jerryl Morin, DO  Chest x-ray -  EKG - 06-17-23 Epic Stress Test - 04-10-17 Epic ECHO - 06-05-22 Epic Cardiac Cath -  Pacemaker/ICD device last checked: Spinal Cord Stimulator: Coronary CT - 07-31-23 Epic  Bowel Prep -   Sleep Study -  CPAP -   Fasting Blood Sugar -  Checks Blood Sugar _____ times a day  Last dose of GLP1 agonist-  N/A GLP1 instructions:  Hold 7 days before surgery    Last dose of SGLT-2 inhibitors-  N/A SGLT-2 instructions:  Hold 3 days before surgery    Blood Thinner Instructions:  Last dose:   Time: Aspirin  Instructions: Last Dose:  Activity level:  Can go up a flight of stairs and perform activities of daily living without stopping and without symptoms of chest pain or shortness of breath.  Able to exercise without symptoms  Unable to go up a flight of stairs without symptoms of     Anesthesia review:   Patient denies shortness of breath, fever, cough and chest pain at PAT appointment  Patient verbalized understanding of instructions that were given to them at the PAT appointment. Patient was also instructed that they will need to review over the PAT instructions again at home before surgery.

## 2024-03-17 NOTE — Progress Notes (Signed)
 Sent message, via epic in basket, requesting orders in epic from Careers adviser.

## 2024-03-17 NOTE — Patient Instructions (Signed)
 SURGICAL WAITING ROOM VISITATION Patients having surgery or a procedure may have no more than 2 support people in the waiting area - these visitors may rotate.    Children under the age of 48 must have an adult with them who is not the patient.  If the patient needs to stay at the hospital during part of their recovery, the visitor guidelines for inpatient rooms apply. Pre-op nurse will coordinate an appropriate time for 1 support person to accompany patient in pre-op.  This support person may not rotate.    Please refer to the [facility] website for the visitor guidelines for Inpatients (after your surgery is over and you are in a regular room).       Your procedure is scheduled on: 03-29-24   Report to [facility] Main Entrance    Report to admitting at 6:00 AM   Call this number if you have problems the morning of surgery [phone]   Do not eat food :After Midnight.   After Midnight you may have the following liquids until 5:15 AM DAY OF SURGERY  Water Non-Citrus Juices (without pulp, NO RED-Apple, White grape, White cranberry) Black Coffee (NO MILK/CREAM OR CREAMERS, sugar ok)  Clear Tea (NO MILK/CREAM OR CREAMERS, sugar ok) regular and decaf                             Plain Jell-O (NO RED)                                           Fruit ices (not with fruit pulp, NO RED)                                     Popsicles (NO RED)                                                               Sports drinks like Gatorade (NO RED)                   The day of surgery:  Drink ONE (1) Pre-Surgery Clear Ensure or G2 by 5:15 AM the morning of surgery. Drink in one sitting. Do not sip.  This drink was given to you during your hospital  pre-op appointment visit. Nothing else to drink after completing the Pre-Surgery Clear Ensure or G2.          If you have questions, please contact your surgeon's office.   FOLLOW  ANY ADDITIONAL PRE OP INSTRUCTIONS YOU RECEIVED FROM  YOUR SURGEON'S OFFICE!!!     Oral Hygiene is also important to reduce your risk of infection.                                    Remember - BRUSH YOUR TEETH THE MORNING OF SURGERY WITH YOUR REGULAR TOOTHPASTE   Do NOT smoke after Midnight   Take these medicines the morning of surgery with A SIP OF WATER:  Amlodipine    Carbidopa-Levodopa   Carvedilol    Divalproex  (Depakote )   Doxycycline   Metoclopramide    Pregabalin    Rosuvastatin   If needed Hydrocodone , Tylenol     Stop all vitamins and herbal supplements 7 days before surgery  Bring CPAP mask and tubing day of surgery.                              You may not have any metal on your body including hair pins, jewelry, and body piercing             Do not wear make-up, lotions, powders, perfumes, or deodorant  Do not wear nail polish including gel and S&S, artificial/acrylic nails, or any other type of covering on natural nails including finger and toenails. If you have artificial nails, gel coating, etc. that needs to be removed by a nail salon please have this removed prior to surgery or surgery may need to be canceled/ delayed if the surgeon/ anesthesia feels like they are unable to be safely monitored.   Do not shave  48 hours prior to surgery.          Do not bring valuables to the hospital. [facility] IS NOT RESPONSIBLE   FOR VALUABLES.   Contacts, dentures or bridgework may not be worn into surgery.   Bring small overnight bag day of surgery.   DO NOT BRING YOUR HOME MEDICATIONS TO THE HOSPITAL. PHARMACY WILL DISPENSE MEDICATIONS LISTED ON YOUR MEDICATION LIST TO YOU DURING YOUR ADMISSION IN THE HOSPITAL!   Special Instructions: Bring a copy of your healthcare power of attorney and living will documents the day of surgery if you haven't scanned them before.              Please read over the following fact sheets you were given: IF YOU HAVE QUESTIONS ABOUT YOUR PRE-OP INSTRUCTIONS PLEASE CALL [phone] [name]  If  you received a COVID test during your pre-op visit  it is requested that you wear a mask when out in public, stay away from anyone that may not be feeling well and notify your surgeon if you develop symptoms. If you test positive for Covid or have been in contact with anyone that has tested positive in the last 10 days please notify you surgeon.  [place] - Preparing for Surgery Before surgery, you can play an important role.  Because skin is not sterile, your skin needs to be as free of germs as possible.  You can reduce the number of germs on your skin by washing with CHG (chlorahexidine gluconate) soap before surgery.  CHG is an antiseptic cleaner which kills germs and bonds with the skin to continue killing germs even after washing. Please DO NOT use if you have an allergy to CHG or antibacterial soaps.  If your skin becomes reddened/irritated stop using the CHG and inform your nurse when you arrive at Short Stay. Do not shave (including legs and underarms) for at least 48 hours prior to the first CHG shower.  You may shave your face/neck.  Please follow these instructions carefully:  1.  Shower with CHG Soap the night before surgery and the  morning of surgery.  2.  If you choose to wash your hair, wash your hair first as usual with your normal  shampoo.  3.  After you shampoo, rinse your hair and body thoroughly to remove the shampoo.  4.  Use CHG as you would any other liquid soap.  You can apply chg directly to the skin and wash.  Gently with a scrungie or clean washcloth.  5.  Apply the CHG Soap to your body ONLY FROM THE NECK DOWN.   Do   not use on face/ open                           Wound or open sores. Avoid contact with eyes, ears mouth and   genitals (private parts).                       Wash face,  Genitals (private parts) with your normal soap.             6.  Wash thoroughly, paying special attention to the area where your    surgery  will be  performed.  7.  Thoroughly rinse your body with warm water from the neck down.  8.  DO NOT shower/wash with your normal soap after using and rinsing off the CHG Soap.                9.  Pat yourself dry with a clean towel.            10.  Wear clean pajamas.            11.  Place clean sheets on your bed the night of your first shower and do not  sleep with pets. Day of Surgery : Do not apply any lotions/deodorants the morning of surgery.  Please wear clean clothes to the hospital/surgery center.  FAILURE TO FOLLOW THESE INSTRUCTIONS MAY RESULT IN THE CANCELLATION OF YOUR SURGERY  PATIENT SIGNATURE_________________________________  NURSE SIGNATURE__________________________________  ________________________________________________________________________

## 2024-03-18 ENCOUNTER — Encounter (HOSPITAL_COMMUNITY)
Admission: RE | Admit: 2024-03-18 | Discharge: 2024-03-18 | Disposition: A | Discharge status: Home / Self Care | Source: Ambulatory Visit | Attending: Family Medicine | Admitting: Family Medicine

## 2024-03-18 ENCOUNTER — Ambulatory Visit: Payer: Self-pay | Admitting: Emergency Medicine

## 2024-03-18 DIAGNOSIS — R519 Headache, unspecified: Secondary | ICD-10-CM | POA: Diagnosis not present

## 2024-03-18 DIAGNOSIS — G8929 Other chronic pain: Secondary | ICD-10-CM | POA: Diagnosis not present

## 2024-03-18 NOTE — H&P (Signed)
 PATELLECTOMY ADMISSION H&P  Patient is being admitted for right patellectomy.  Subjective:  Chief Complaint:right knee pain.  HPI: Kristin Gilmore, 73 y.o. 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  Onset of symptoms was gradual, starting 4 years ago with gradually worsening course since that time. The patient noted prior procedures on the knee to include  arthroplasty on the right knee(s).  Patient currently rates pain in the right knee(s) at 9 out of 10 with activity. Patient has night pain, worsening of pain with activity and weight bearing, pain that interferes with activities of daily living, and pain with passive range of motion.  Patient has evidence of avascular necrosis by imaging studies.  There is no active infection.  Patient Active Problem List   Diagnosis Date Noted   Pain due to onychomycosis of toenails of both feet 06/09/2023   Dermatitis 06/09/2023   Lipodermatosclerosis 05/22/2023   SK (seborrheic keratosis) 05/22/2023   Acute pain of right shoulder 07/02/2022   Lumbar radicular pain    AKI (acute kidney injury) (HCC)    Bursitis of right elbow    Bacteremia due to methicillin susceptible Staphylococcus aureus (MSSA) 05/29/2022   SIRS (systemic inflammatory response syndrome) (HCC) 05/28/2022   Bipolar disorder, in partial remission, most recent episode hypomanic (HCC) 05/28/2022   Hyperkalemia 05/28/2022   Dehydration 05/28/2022   Essential hypertension 05/28/2022   Intractable back pain 05/27/2022   Other spondylosis with radiculopathy, lumbar region 10/30/2021   Other secondary scoliosis, lumbar region 10/30/2021   Spondylolisthesis, lumbar region 10/30/2021   Spondylolisthesis of lumbosacral region 10/30/2021   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/2021   Primary osteoarthritis of right knee 06/25/2021   Parkinson's disease (HCC) 01/24/2021   Type 2 diabetes mellitus with diabetic mononeuropathy, without long-term current use of insulin  (HCC) 10/23/2020   Trochanteric bursitis, left hip 01/18/2020   Lumbar spinal stenosis 12/19/2017   Pain in joint, ankle and foot 05/02/2016   Chronic venous insufficiency 05/02/2016   Chronic fatigue 02/14/2016   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   Pneumonia    Pre-diabetes    i was told i was pre-diabetic a while ago   Tremor 04/13/2014    Past Surgical History:  Procedure Laterality Date   CHOLECYSTECTOMY     COLONOSCOPY W/ POLYPECTOMY     LUMBAR LAMINECTOMY  07/2020   3 level decompression - [place], [place]   LUMBAR LAMINECTOMY/DECOMPRESSION MICRODISCECTOMY N/A 12/19/2017   Procedure: LAMINECTOMY LUMBAR TWO- LUMBAR THREE, LUMBAR THREE- LUMBAR FOUR, LUMBAR FOUR- LUMBAR FIVE ;  Surgeon: Augusto Blonder, MD;  Location: [facility] OR;  Service: Neurosurgery;  Laterality: N/A;   NASAL SINUS SURGERY     RADIOLOGY WITH ANESTHESIA N/A 05/30/2022   Procedure: MRI WITH ANESTHESIA LUMBAR SPINE W/WO CONSTRAST;  Surgeon: Radiologist,  Medication, MD;  Location: [facility] OR;  Service: Radiology;  Laterality: N/A;   TEE WITHOUT CARDIOVERSION N/A 06/05/2022   Procedure: TRANSESOPHAGEAL ECHOCARDIOGRAM (TEE);  Surgeon: Hazle Lites, MD;  Location: [facility] ENDOSCOPY;  Service: Cardiovascular;  Laterality: N/A;   TONSILLECTOMY     TOTAL KNEE ARTHROPLASTY Right 06/26/2021   Procedure: RIGHT TOTAL KNEE ARTHROPLASTY;  Surgeon: Wes Hamman, MD;  Location: [facility] OR;  Service: Orthopedics;  Laterality: Right;   WISDOM TOOTH EXTRACTION      Current Outpatient Medications  Medication Sig Dispense Refill Last Dose/Taking   amitriptyline  (ELAVIL ) 10 MG tablet Take 1 tablet (10 mg total) by mouth at bedtime as needed for sleep. (Patient taking differently: Take 25 mg by mouth at bedtime.) 270 tablet 1    amLODipine  (NORVASC ) 5 MG tablet Take 5 mg by mouth 2 (two) times daily.      aspirin  EC 81 MG tablet Take 81 mg by mouth daily. Swallow whole.      Azelastine HCl 137 MCG/SPRAY SOLN Place 1 spray into both nostrils in the morning and at bedtime. AS DIRECTED      bisacodyl  (DULCOLAX) 10 MG suppository Place 10 mg rectally as needed for moderate constipation.      bisacodyl  (DULCOLAX) 5 MG EC tablet Take 5-15 mg by mouth at bedtime as needed (constipation).      Black Cohosh 200 MG CAPS Take 200 mg by mouth daily.      Calcium  Citrate-Vitamin D  (CALCIUM  CITRATE + D PO) Take 1-2 capsules by mouth See admin instructions. Calcium  200 mg, vitamin D3 2.5 mcg - 100 units - take 1 tablet by mouth with breakfast and 2 tablets with supper      carbidopa-levodopa (SINEMET IR) 25-100 MG tablet Take 1 tablet by mouth 3 (three) times daily.      carvedilol  (COREG ) 12.5 MG tablet Take 12.5 mg by mouth 2 (two) times daily.      Cholecalciferol  (VITAMIN D3) 50 MCG (2000 UT) capsule Take 2,000 Units by mouth daily.      Coenzyme Q10 (COQ10) 100 MG CAPS Take 100 mg by mouth daily.      divalproex  (DEPAKOTE  ER) 250 MG 24 hr tablet Take 1 tablet (250 mg total) by mouth 2  (two) times daily. (Patient taking differently: Take 250 mg by mouth daily.) 180 tablet 1    docusate sodium  (COLACE) 100 MG capsule Take 300 mg by mouth at bedtime as needed for moderate constipation.      HYDROcodone -acetaminophen  (NORCO/VICODIN) 5-325 MG tablet Take 1 tablet by mouth every 6 (six) hours as needed for severe pain (pain score 7-10). 5 tablet 0    levocetirizine (XYZAL) 5 MG tablet Take 5 mg by mouth every evening.      Menthol , Topical Analgesic, (BIOFREEZE) 4 % GEL Apply 1 application  topically 3 (three) times daily as needed (pain).      metoCLOPramide  (REGLAN ) 10 MG tablet Take 1 tablet (10 mg total) by mouth every 6 (six) hours. 10 tablet 0    nitroGLYCERIN  (NITROSTAT ) 0.4 MG SL tablet Place 0.4 mg under the tongue every 5 (five) minutes as needed for chest pain.      Nutritional Supplements (BRAIN SUPPORT) CAPS Take 1 capsule by mouth in the  morning and at bedtime.      pregabalin  (LYRICA ) 75 MG capsule TAKE 1 CAPSULE BY MOUTH THREE TIMES A DAY 90 capsule 1    QUEtiapine  (SEROQUEL ) 300 MG tablet TAKE 0.5 TABLETS (150 MG TOTAL) BY MOUTH AT BEDTIME AS NEEDED. 45 tablet 0    rosuvastatin (CRESTOR) 20 MG tablet Take 20 mg by mouth every Monday, Wednesday, and Friday. Dinner      telmisartan (MICARDIS) 80 MG tablet Take 80 mg by mouth at bedtime.      Turmeric 500 MG CAPS Take 500 mg by mouth 2 (two) times daily with a meal. extract      No current facility-administered medications for this visit.   Allergies  Allergen Reactions   Levofloxacin Other (See Comments)    Dizziness (intolerance)  Other Reaction(s): Dizziness   Amoxicillin Hives    UNSPECIFIED REACTION   Has patient had a PCN reaction causing immediate rash, facial/tongue/throat swelling, SOB or lightheadedness with hypotension: Unknown  Has patient had a PCN reaction causing severe rash involving mucus membranes or skin necrosis: Unknown  Has patient had a PCN reaction that required hospitalization:  Unknown  Has patient had a PCN reaction occurring within the last 10 years: No  If all of the above answers are NO, then may proceed with Cephalosporin use.  Sweats  Other Reaction(s): Unknown   Clarithromycin Hives, Other (See Comments) and Rash    Other Reaction(s): GI Intolerance, Other (See Comments), Unknown   Atropine Hives    Social History   Tobacco Use   Smoking status: Never   Smokeless tobacco: Never  Substance Use Topics   Alcohol use: Not Currently    Family History  Problem Relation Age of Onset   Cancer Father        stomach cancer   Migraines Mother      Review of Systems  Musculoskeletal:  Positive for arthralgias.  All other systems reviewed and are negative.   Objective:  Physical Exam Constitutional:      General: She is not in acute distress.    Appearance: Normal appearance. She is not ill-appearing.  HENT:     Head: Normocephalic and atraumatic.     Right Ear: External ear normal.     Left Ear: External ear normal.     Nose: Nose normal.     Mouth/Throat:     Mouth: Mucous membranes are moist.     Pharynx: Oropharynx is clear.   Eyes:     Extraocular Movements: Extraocular movements intact.     Conjunctiva/sclera: Conjunctivae normal.    Cardiovascular:     Rate and Rhythm: Normal rate and regular rhythm.     Pulses: Normal pulses.     Heart sounds: Normal heart sounds.  Pulmonary:     Effort: Pulmonary effort is normal.     Breath sounds: Normal breath sounds.  Abdominal:     General: Bowel sounds are normal.     Palpations: Abdomen is soft.     Tenderness: There is no abdominal tenderness.   Musculoskeletal:        General: Tenderness present.     Cervical back: Normal range of motion and neck supple.     Comments: TTP over patella.  No calf tenderness, swelling, or erythema.  No overlying lesions of area of chief complaint.  Decreased strength and ROM due to elicited pain.  Pre-operative ROM 0-120.  Dorsiflexion and  plantarflexion intact.  Stable to varus and valgus stress.  BLE appear  grossly neurovascularly intact.  Gait antalgic, difficulty with full weight bearing.    Skin:    General: Skin is warm and dry.   Neurological:     Mental Status: She is alert and oriented to person, place, and time. Mental status is at baseline.   Psychiatric:        Mood and Affect: Mood normal.        Behavior: Behavior normal.     Vital signs in last 24 hours: @VSRANGES @  Labs:   Estimated body mass index is 29.76 kg/m as calculated from the following:   Height as of 01/30/24: 5' 7 (1.702 m).   Weight as of 01/30/24: 86.2 kg.   Imaging Review Plain radiographs demonstrate severe avascular necrosis of the patella, right knee(s). The overall alignment is neutral. The bone quality appears to be fair to poor for age and reported activity level.      Assessment/Plan:  Avascular necrosis, patella, right knee   The patient history, physical examination, clinical judgment of the provider and imaging studies are consistent with avascular necrosis of the patella of the right knee(s) and patellectomy is deemed medically necessary. The treatment options including medical management, injection therapy, patellectomy, and non-operative management were discussed at length. The risks and benefits of patellectomy were presented and reviewed. The risks due to aseptic loosening, infection, stiffness, thromboembolic complications and other imponderables were discussed. The patient acknowledged the explanation, agreed to proceed with the plan and consent was signed. Patient is being admitted for inpatient treatment for surgery, pain control, PT, OT, prophylactic antibiotics, VTE prophylaxis, progressive ambulation and ADL's and discharge planning. The patient is planning to be discharged home with home health services ([facility])    Anticipated LOS equal to or greater than 2 midnights due to - Age 70 and older with one or  more of the following:  - Obesity  - Expected need for hospital services (PT, OT, Nursing) required for safe  discharge  - Anticipated need for postoperative skilled nursing care or inpatient rehab  - Active co-morbidities: Diabetes, HTN, HLD, CKD stage IIIb, chronic venous insufficiency, pituitary microadenoma, migraines, parkinsons, anxiety, depression, geriatric OR   - Unanticipated findings during/Post Surgery: None  - Patient is a high risk of re-admission due to: Barriers to post-acute care (logistical, no family support in home)

## 2024-03-19 ENCOUNTER — Encounter (HOSPITAL_COMMUNITY)
Admission: RE | Admit: 2024-03-19 | Discharge: 2024-03-19 | Disposition: A | Discharge status: Home / Self Care | Source: Ambulatory Visit | Attending: Anesthesiology | Admitting: Anesthesiology

## 2024-03-19 NOTE — Progress Notes (Signed)
 COVID Vaccine received:  []  No []  Yes Date of any COVID positive Test in last 90 days:  PCP - Mordechai April, DO  clearance scanned to [facility] Cardiologist - Jerryl Morin, DO,  Charles Connor, NP cardiac clearance in 01-15-24 Epic note  Chest x-ray - 06-16-2023  2v  Epic EKG -  06-17-2023 Epic Stress Test - 04-10-2017  Epic ECHO - 06-05-2022  Epic Cardiac Cath -  CT Coronary Calcium  score: 22.2  on 07-31-2023 Epic  Pacemaker / ICD device [x]  No []  Yes   Spinal Cord Stimulator:[x]  No []  Yes       History of Sleep Apnea? [x]  No []  Yes   CPAP used?- [x]  No []  Yes    Does the patient monitor blood sugar?   []  N/A   []  No []  Yes  Patient has: []  NO Hx DM   []  Pre-DM   []  DM1  []   DM2 Last A1c was: 7.3 on   02-13-2024   Does patient have a [employer] or Dexcom? []  No []  Yes   Fasting Blood Sugar Ranges-  Checks Blood Sugar _____ times a day  Blood Thinner / Instructions:  none Aspirin  Instructions:  ASA 81mg   hold x 5-7 days  ERAS Protocol Ordered: []  No  [x]  Yes PRE-SURGERY []  ENSURE  [x]  G2   Patient is to be NPO after: 0515  Dental hx: []  Dentures:  []  N/A      []  Bridge or Partial:                   []  Loose or Damaged teeth:   Comments:   Activity level: Able to walk up 2 flights of stairs without becoming significantly short of breath or having chest pain?  []  No   []    Yes   Anesthesia review: Parkinson's- tremors, CPS ? Failed Back, Bipolar- anxiety, HTN, DM- no meds yet, PCP to start after surgery per note.   Patient denies shortness of breath, fever, cough and chest pain at PAT appointment.  Patient verbalized understanding and agreement to the Pre-Surgical Instructions that were given to them at this PAT appointment. Patient was also educated of the need to review these PAT instructions again prior to her surgery.I reviewed the appropriate phone numbers to call if they have any and questions or concerns.

## 2024-03-19 NOTE — Patient Instructions (Signed)
 SURGICAL WAITING ROOM VISITATION Patients having surgery or a procedure may have no more than 2 support people in the waiting area - these visitors may rotate.     Children under the age of 7 must have an adult with them who is not the patient.   If the patient needs to stay at the hospital during part of their recovery, the visitor guidelines for inpatient rooms apply. Pre-op nurse will coordinate an appropriate time for 1 support person to accompany patient in pre-op.  This support person may not rotate.    Please refer to the [facility] website for the visitor guidelines for Inpatients (after your surgery is over and you are in a regular room).                    Your procedure is scheduled on: 03-29-24               Report to [facility] Main Entrance               Report to admitting at 6:00 AM               Call this number if you have problems the morning of surgery [phone]               Do not eat food :After Midnight.               After Midnight you may have the following liquids until 5:15 AM DAY OF SURGERY   Water Non-Citrus Juices (without pulp, NO RED-Apple, White grape, White cranberry) Black Coffee (NO MILK/CREAM OR CREAMERS, sugar ok)  Clear Tea (NO MILK/CREAM OR CREAMERS, sugar ok) regular and decaf                             Plain Jell-O (NO RED)                                           Fruit ices (not with fruit pulp, NO RED)                                     Popsicles (NO RED)                                                               Sports drinks like Gatorade (NO RED)                         The day of surgery:  Drink ONE (1) Pre-Surgery  G2 by 5:15 AM the morning of surgery. Drink in one sitting. Do not sip.  This drink was given to you during your hospital  pre-op appointment visit. Nothing else to drink after completing the Pre-Surgery G2.          If you have questions, please contact your surgeon's office.     FOLLOW  ANY  ADDITIONAL PRE OP INSTRUCTIONS YOU RECEIVED FROM YOUR SURGEON'S OFFICE!!!     Oral Hygiene is also important  to reduce your risk of infection.                                    Remember - BRUSH YOUR TEETH THE MORNING OF SURGERY WITH YOUR REGULAR TOOTHPASTE               Do NOT smoke after Midnight              ASPIRIN - Stop taking 7 days prior to your surgery.  Last Dose will be taken on Monday 03-22-24               Take these medicines the morning of surgery with A SIP OF WATER:                          Amlodipine                          Carbidopa-Levodopa                         Carvedilol                          Divalproex  (Depakote )                         Pregabalin                          If needed Hydrocodone , Tylenol                You may use your Nasal spray if needed.                           Stop all vitamins and herbal supplements 7 days before surgery                              You may not have any metal on your body including hair pins, jewelry, and body piercing              Do not wear make-up, lotions, powders, perfumes, or deodorant   Do not wear nail polish including gel and S&S, artificial/acrylic nails, or any other type of covering on natural nails including finger and toenails. If you have artificial nails, gel coating, etc. that needs to be removed by a nail salon please have this removed prior to surgery or surgery may need to be canceled/ delayed if the surgeon/ anesthesia feels like they are unable to be safely monitored.    Do not shave  48 hours prior to surgery.                     Do not bring valuables to the hospital. [facility] IS NOT RESPONSIBLE   FOR VALUABLES.               Contacts, dentures or bridgework may not be worn into surgery.               Bring small overnight bag day of surgery.    DO NOT BRING YOUR HOME MEDICATIONS TO THE HOSPITAL. PHARMACY WILL DISPENSE MEDICATIONS LISTED ON YOUR MEDICATION LIST TO YOU DURING YOUR ADMISSION  IN THE HOSPITAL!               Special Instructions: Bring a copy of your healthcare power of attorney and living will documents the day of surgery if you haven't scanned them before.               Please read over the following fact sheets you were given: IF YOU HAVE QUESTIONS ABOUT YOUR PRE-OP INSTRUCTIONS PLEASE CALL [phone] [facility] - Preparing for Surgery Before surgery, you can play an important role.  Because skin is not sterile, your skin needs to be as free of germs as possible.  You can reduce the number of germs on your skin by washing with CHG (chlorahexidine gluconate) soap before surgery.  CHG is an antiseptic cleaner which kills germs and bonds with the skin to continue killing germs even after washing. Please DO NOT use if you have an allergy to CHG or antibacterial soaps.  If your skin becomes reddened/irritated stop using the CHG and inform your nurse when you arrive at Short Stay. Do not shave (including legs and underarms) for at least 48 hours prior to the first CHG shower.  You may shave your face/neck.   Please follow these instructions carefully:             1.  Shower with CHG Soap the night before surgery and the  morning of surgery.             2.  If you choose to wash your hair, wash your hair first as usual with your normal  shampoo.             3.  After you shampoo, rinse your hair and body thoroughly to remove the shampoo.                                           4.  Use CHG as you would any other liquid soap.  You can apply chg directly to the skin and wash.  Gently with a scrungie or clean washcloth.             5.  Apply the CHG Soap to your body ONLY FROM THE NECK DOWN.   Do not use on face/ open                           Wound or open sores. Avoid contact with eyes, ears mouth and genitals (private parts).                       Wash face,  Genitals (private parts) with your normal soap.             6.  Wash thoroughly, paying special  attention to the area where your  surgery  will be performed.             7.  Thoroughly rinse your body with warm water from the neck down.             8.  DO NOT shower/wash with your normal soap after using and rinsing off the CHG Soap.                9.  Pat yourself dry with a clean towel.  10.  Wear clean pajamas.            11.  Place clean sheets on your bed the night of your first shower and do not  sleep with pets.   Day of Surgery : Do not apply any lotions/deodorants the morning of surgery.  Please wear clean clothes to the hospital/surgery center.   FAILURE TO FOLLOW THESE INSTRUCTIONS MAY RESULT IN THE CANCELLATION OF YOUR SURGERY   PATIENT SIGNATURE_________________________________   NURSE SIGNATURE__________________________________   ________________________________________________________________________

## 2024-03-22 DIAGNOSIS — G43701 Chronic migraine without aura, not intractable, with status migrainosus: Secondary | ICD-10-CM | POA: Diagnosis not present

## 2024-03-22 DIAGNOSIS — G4486 Cervicogenic headache: Secondary | ICD-10-CM | POA: Diagnosis not present

## 2024-03-22 DIAGNOSIS — M542 Cervicalgia: Secondary | ICD-10-CM | POA: Diagnosis not present

## 2024-03-26 ENCOUNTER — Encounter (HOSPITAL_COMMUNITY): Admission: RE | Admit: 2024-03-26 | Source: Ambulatory Visit

## 2024-03-29 ENCOUNTER — Inpatient Hospital Stay (HOSPITAL_COMMUNITY): Admit: 2024-03-29 | Admitting: Orthopedic Surgery

## 2024-03-29 DIAGNOSIS — R42 Dizziness and giddiness: Secondary | ICD-10-CM | POA: Diagnosis not present

## 2024-03-29 DIAGNOSIS — J0101 Acute recurrent maxillary sinusitis: Secondary | ICD-10-CM | POA: Diagnosis not present

## 2024-03-29 SURGERY — PATELLECTOMY
Anesthesia: Spinal | Site: Knee | Laterality: Right

## 2024-04-03 ENCOUNTER — Emergency Department (HOSPITAL_COMMUNITY)

## 2024-04-03 ENCOUNTER — Observation Stay (HOSPITAL_COMMUNITY)

## 2024-04-03 ENCOUNTER — Other Ambulatory Visit: Payer: Self-pay

## 2024-04-03 ENCOUNTER — Encounter (HOSPITAL_COMMUNITY): Payer: Self-pay | Admitting: Internal Medicine

## 2024-04-03 ENCOUNTER — Observation Stay (HOSPITAL_COMMUNITY)
Admission: EM | Admit: 2024-04-03 | Discharge: 2024-04-08 | Disposition: A | Discharge status: Home Health | Attending: Internal Medicine | Admitting: Internal Medicine

## 2024-04-03 DIAGNOSIS — N1831 Chronic kidney disease, stage 3a: Secondary | ICD-10-CM | POA: Insufficient documentation

## 2024-04-03 DIAGNOSIS — Z23 Encounter for immunization: Secondary | ICD-10-CM | POA: Diagnosis not present

## 2024-04-03 DIAGNOSIS — D649 Anemia, unspecified: Secondary | ICD-10-CM | POA: Diagnosis not present

## 2024-04-03 DIAGNOSIS — I129 Hypertensive chronic kidney disease with stage 1 through stage 4 chronic kidney disease, or unspecified chronic kidney disease: Secondary | ICD-10-CM | POA: Diagnosis not present

## 2024-04-03 DIAGNOSIS — K5909 Other constipation: Secondary | ICD-10-CM | POA: Insufficient documentation

## 2024-04-03 DIAGNOSIS — R197 Diarrhea, unspecified: Secondary | ICD-10-CM | POA: Diagnosis not present

## 2024-04-03 DIAGNOSIS — E872 Acidosis, unspecified: Secondary | ICD-10-CM | POA: Insufficient documentation

## 2024-04-03 DIAGNOSIS — Z7982 Long term (current) use of aspirin: Secondary | ICD-10-CM | POA: Diagnosis not present

## 2024-04-03 DIAGNOSIS — N179 Acute kidney failure, unspecified: Secondary | ICD-10-CM | POA: Diagnosis not present

## 2024-04-03 DIAGNOSIS — G20C Parkinsonism, unspecified: Secondary | ICD-10-CM | POA: Insufficient documentation

## 2024-04-03 DIAGNOSIS — Z79899 Other long term (current) drug therapy: Secondary | ICD-10-CM | POA: Insufficient documentation

## 2024-04-03 DIAGNOSIS — K633 Ulcer of intestine: Secondary | ICD-10-CM | POA: Insufficient documentation

## 2024-04-03 DIAGNOSIS — Z96651 Presence of right artificial knee joint: Secondary | ICD-10-CM | POA: Diagnosis not present

## 2024-04-03 DIAGNOSIS — G589 Mononeuropathy, unspecified: Secondary | ICD-10-CM | POA: Insufficient documentation

## 2024-04-03 DIAGNOSIS — Q2733 Arteriovenous malformation of digestive system vessel: Secondary | ICD-10-CM | POA: Diagnosis not present

## 2024-04-03 DIAGNOSIS — R531 Weakness: Secondary | ICD-10-CM | POA: Diagnosis not present

## 2024-04-03 DIAGNOSIS — K529 Noninfective gastroenteritis and colitis, unspecified: Principal | ICD-10-CM | POA: Insufficient documentation

## 2024-04-03 DIAGNOSIS — Z7409 Other reduced mobility: Secondary | ICD-10-CM | POA: Diagnosis not present

## 2024-04-03 DIAGNOSIS — I959 Hypotension, unspecified: Secondary | ICD-10-CM | POA: Diagnosis not present

## 2024-04-03 DIAGNOSIS — E785 Hyperlipidemia, unspecified: Secondary | ICD-10-CM | POA: Insufficient documentation

## 2024-04-03 DIAGNOSIS — K922 Gastrointestinal hemorrhage, unspecified: Principal | ICD-10-CM | POA: Diagnosis present

## 2024-04-03 DIAGNOSIS — K559 Vascular disorder of intestine, unspecified: Secondary | ICD-10-CM | POA: Diagnosis not present

## 2024-04-03 DIAGNOSIS — K449 Diaphragmatic hernia without obstruction or gangrene: Secondary | ICD-10-CM | POA: Diagnosis not present

## 2024-04-03 DIAGNOSIS — I10 Essential (primary) hypertension: Secondary | ICD-10-CM | POA: Diagnosis not present

## 2024-04-03 DIAGNOSIS — M25552 Pain in left hip: Secondary | ICD-10-CM | POA: Diagnosis not present

## 2024-04-03 DIAGNOSIS — R933 Abnormal findings on diagnostic imaging of other parts of digestive tract: Secondary | ICD-10-CM | POA: Insufficient documentation

## 2024-04-03 DIAGNOSIS — Q600 Renal agenesis, unilateral: Secondary | ICD-10-CM | POA: Diagnosis not present

## 2024-04-03 DIAGNOSIS — I700 Atherosclerosis of aorta: Secondary | ICD-10-CM | POA: Diagnosis not present

## 2024-04-03 DIAGNOSIS — R42 Dizziness and giddiness: Secondary | ICD-10-CM | POA: Diagnosis not present

## 2024-04-03 DIAGNOSIS — R0902 Hypoxemia: Secondary | ICD-10-CM | POA: Diagnosis not present

## 2024-04-03 LAB — COMPREHENSIVE METABOLIC PANEL WITH GFR
ALT: <5 U/L (ref 0–44)
AST: 13 U/L — ABNORMAL LOW (ref 15–41)
Albumin: 3.2 g/dL — ABNORMAL LOW (ref 3.5–5.0)
Alkaline Phosphatase: 84 U/L (ref 38–126)
Anion gap: 14 (ref 5–15)
BUN: 48 mg/dL — ABNORMAL HIGH (ref 8–23)
CO2: 23 mmol/L (ref 22–32)
Calcium: 8.2 mg/dL — ABNORMAL LOW (ref 8.9–10.3)
Chloride: 99 mmol/L (ref 98–111)
Creatinine, Ser: 2.14 mg/dL — ABNORMAL HIGH (ref 0.44–1.00)
GFR, Estimated: 24 mL/min — ABNORMAL LOW (ref >60)
Glucose, Bld: 133 mg/dL — ABNORMAL HIGH (ref 70–99)
Potassium: 5.1 mmol/L (ref 3.5–5.1)
Sodium: 136 mmol/L (ref 135–145)
Total Bilirubin: 1 mg/dL (ref 0.0–1.2)
Total Protein: 5.9 g/dL — ABNORMAL LOW (ref 6.5–8.1)

## 2024-04-03 LAB — CBC WITH DIFFERENTIAL/PLATELET
Abs Immature Granulocytes: 0.03 K/uL (ref 0.00–0.07)
Basophils Absolute: 0 K/uL (ref 0.0–0.1)
Basophils Relative: 0 %
Eosinophils Absolute: 0.1 K/uL (ref 0.0–0.5)
Eosinophils Relative: 2 %
HCT: 31.9 % — ABNORMAL LOW (ref 36.0–46.0)
Hemoglobin: 10.5 g/dL — ABNORMAL LOW (ref 12.0–15.0)
Immature Granulocytes: 0 %
Lymphocytes Relative: 9 %
Lymphs Abs: 0.7 K/uL (ref 0.7–4.0)
MCH: 32.1 pg (ref 26.0–34.0)
MCHC: 32.9 g/dL (ref 30.0–36.0)
MCV: 97.6 fL (ref 80.0–100.0)
Monocytes Absolute: 0.7 K/uL (ref 0.1–1.0)
Monocytes Relative: 10 %
Neutro Abs: 5.7 K/uL (ref 1.7–7.7)
Neutrophils Relative %: 79 %
Platelets: 175 K/uL (ref 150–400)
RBC: 3.27 MIL/uL — ABNORMAL LOW (ref 3.87–5.11)
RDW: 14.5 % (ref 11.5–15.5)
WBC: 7.2 K/uL (ref 4.0–10.5)
nRBC: 0 % (ref 0.0–0.2)

## 2024-04-03 LAB — URINALYSIS, ROUTINE W REFLEX MICROSCOPIC
Bilirubin Urine: NEGATIVE
Glucose, UA: >=500 mg/dL — AB
Ketones, ur: NEGATIVE mg/dL
Nitrite: NEGATIVE
Protein, ur: NEGATIVE mg/dL
Specific Gravity, Urine: 1.002 — ABNORMAL LOW (ref 1.005–1.030)
pH: 5 (ref 5.0–8.0)

## 2024-04-03 LAB — LIPASE, BLOOD: Lipase: 23 U/L (ref 11–51)

## 2024-04-03 LAB — I-STAT CG4 LACTIC ACID, ED
Lactic Acid, Venous: 2.5 mmol/L (ref 0.5–1.9)
Lactic Acid, Venous: 3.2 mmol/L (ref 0.5–1.9)
Lactic Acid, Venous: 1.3 mmol/L (ref 0.5–1.9)

## 2024-04-03 LAB — POC OCCULT BLOOD, ED: Fecal Occult Bld: POSITIVE — AB

## 2024-04-03 MED ORDER — PNEUMOCOCCAL 20-VAL CONJ VACC 0.5 ML IM SUSY
0.5000 mL | PREFILLED_SYRINGE | INTRAMUSCULAR | Status: AC
  Administered 2024-04-04: 0.5 mL via INTRAMUSCULAR
  Filled 2024-04-03: qty 0.5

## 2024-04-03 MED ORDER — OXYCODONE HCL 5 MG PO TABS
5.0000 mg | ORAL_TABLET | ORAL | Status: DC | PRN
  Administered 2024-04-03 – 2024-04-08 (×8): 5 mg via ORAL
  Filled 2024-04-03 (×9): qty 1

## 2024-04-03 MED ORDER — ONDANSETRON HCL 4 MG/2ML IJ SOLN: 4.0000 mg | Freq: Four times a day (QID) | INTRAMUSCULAR | Status: DC | PRN

## 2024-04-03 MED ORDER — LACTATED RINGERS IV BOLUS
1000.0000 mL | Freq: Once | INTRAVENOUS | Status: AC
  Administered 2024-04-03: 1000 mL via INTRAVENOUS

## 2024-04-03 MED ORDER — IOHEXOL 350 MG/ML SOLN
80.0000 mL | Freq: Once | INTRAVENOUS | Status: AC | PRN
Start: 2024-04-03 — End: 2024-04-03
  Administered 2024-04-03: 80 mL via INTRAVENOUS

## 2024-04-03 MED ORDER — PANTOPRAZOLE SODIUM 40 MG IV SOLR
40.0000 mg | Freq: Once | INTRAVENOUS | Status: AC
  Administered 2024-04-03: 40 mg via INTRAVENOUS
  Filled 2024-04-03: qty 10

## 2024-04-03 MED ORDER — ALBUTEROL SULFATE (2.5 MG/3ML) 0.083% IN NEBU: 2.5000 mg | INHALATION_SOLUTION | RESPIRATORY_TRACT | Status: DC | PRN

## 2024-04-03 MED ORDER — ACETAMINOPHEN 650 MG RE SUPP: 650.0000 mg | Freq: Four times a day (QID) | RECTAL | Status: DC | PRN

## 2024-04-03 MED ORDER — ONDANSETRON HCL 4 MG PO TABS: 4.0000 mg | ORAL_TABLET | Freq: Four times a day (QID) | ORAL | Status: DC | PRN

## 2024-04-03 MED ORDER — ACETAMINOPHEN 325 MG PO TABS
650.0000 mg | ORAL_TABLET | Freq: Four times a day (QID) | ORAL | Status: DC | PRN
  Administered 2024-04-05 – 2024-04-06 (×4): 650 mg via ORAL
  Filled 2024-04-03 (×4): qty 2

## 2024-04-03 MED ORDER — LACTATED RINGERS IV SOLN: INTRAVENOUS | Status: AC

## 2024-04-03 NOTE — ED Triage Notes (Signed)
 Per EMS from home. Diarrhea for two days. Dizziness/weakness. Hx of colitis.  BP 110/40 HR 72 96 on RA CBG 115

## 2024-04-03 NOTE — ED Notes (Signed)
 I gave critical I stat Lactic results to MD Neysa

## 2024-04-03 NOTE — ED Provider Notes (Signed)
 [place] EMERGENCY DEPARTMENT AT [facility] Provider Note   CSN: [id number] Arrival date & time: 04/03/24  9152     Patient presents with: Dizziness and Diarrhea   Kristin Gilmore is a 73 y.o. female.   Presented to the emergency department for lightheadedness in the setting of diarrhea x 2 days.  Reported she had some constipation several days ago then ate 10 prunes x 2 days, took over-the-counter laxatives x 2 days.  She then began having her diarrhea.  She notes some lightheadedness getting up from the bed going to the bathroom.  Not having headache, chest pain, shortness of breath.  Reports crampy abdominal pain that comes in waves and resolves after bowel movement.  No blood in stool.  No vomiting.   Dizziness Associated symptoms: diarrhea   Diarrhea      Prior to Admission medications   Medication Sig Start Date End Date Taking? Authorizing Provider  AIMOVIG  140 MG/ML SOAJ Inject 140 mg into the skin every 30 (thirty) days.    [provider]  amitriptyline  (ELAVIL ) 10 MG tablet Take 1 tablet (10 mg total) by mouth at bedtime as needed for sleep. Patient taking differently: Take 25 mg by mouth at bedtime. 06/08/22   [name], MD  amLODipine  (NORVASC ) 5 MG tablet Take 5 mg by mouth 2 (two) times daily. 07/27/18   [provider]  aspirin  EC 81 MG tablet Take 81 mg by mouth daily. Swallow whole.    [provider]  Azelastine HCl 137 MCG/SPRAY SOLN Place 1 spray into both nostrils in the morning and at bedtime. AS DIRECTED 01/08/24   [provider]  bisacodyl  (DULCOLAX) 10 MG suppository Place 10 mg rectally as needed for moderate constipation.    [provider]  bisacodyl  (DULCOLAX) 5 MG EC tablet Take 5-15 mg by mouth at bedtime as needed (constipation).    [provider]  Black Cohosh 200 MG CAPS Take 200 mg by mouth daily.    [provider]  Calcium  Citrate-Vitamin D  (CALCIUM   CITRATE + D PO) Take 1-2 capsules by mouth See admin instructions. Calcium  200 mg, vitamin D3 2.5 mcg - 100 units - take 1 tablet by mouth with breakfast and 2 tablets with supper    [provider]  carbidopa -levodopa  (SINEMET  IR) 25-100 MG tablet Take 1 tablet by mouth 3 (three) times daily. 12/25/22   [provider]  carvedilol  (COREG ) 12.5 MG tablet Take 12.5 mg by mouth 2 (two) times daily. 03/24/23   [provider]  Cholecalciferol  (VITAMIN D3) 50 MCG (2000 UT) capsule Take 2,000 Units by mouth daily.    [provider]  Coenzyme Q10 (COQ10) 100 MG CAPS Take 100 mg by mouth daily.    [provider]  divalproex  (DEPAKOTE  ER) 250 MG 24 hr tablet Take 1 tablet (250 mg total) by mouth 2 (two) times daily. Patient taking differently: Take 250 mg by mouth daily. 01/19/24   Cottle, Lorene MATSU Mickey., MD  docusate sodium  (COLACE) 100 MG capsule Take 300 mg by mouth at bedtime as needed for moderate constipation.    [provider]  HYDROcodone -acetaminophen  (NORCO/VICODIN) 5-325 MG tablet Take 1 tablet by mouth every 6 (six) hours as needed for severe pain (pain score 7-10). Patient not taking: Reported on 03/19/2024 01/29/24   Geiple, Joshua, PA-C  levocetirizine (XYZAL) 5 MG tablet Take 5 mg by mouth every evening.    [provider]  Menthol , Topical Analgesic, (  BIOFREEZE) 4 % GEL Apply 1 application  topically 3 (three) times daily as needed (pain).    [provider]  metoCLOPramide  (REGLAN ) 10 MG tablet Take 1 tablet (10 mg total) by mouth every 6 (six) hours. 01/29/24   Desiderio Chew, PA-C  nitroGLYCERIN  (NITROSTAT ) 0.4 MG SL tablet Place 0.4 mg under the tongue every 5 (five) minutes as needed for chest pain.    [provider]  Nutritional Supplements (BRAIN SUPPORT) CAPS Take 1 capsule by mouth in the morning and at bedtime.    [provider]  pregabalin  (LYRICA ) 75 MG capsule TAKE 1 CAPSULE BY MOUTH THREE TIMES A  DAY Patient taking differently: Take 75 mg by mouth 2 (two) times daily. 11/26/23   Georgina Ozell LABOR, MD  QUEtiapine  (SEROQUEL ) 300 MG tablet TAKE 0.5 TABLETS (150 MG TOTAL) BY MOUTH AT BEDTIME AS NEEDED. 03/02/24   Cottle, Lorene KANDICE Raddle., MD  rosuvastatin  (CRESTOR ) 20 MG tablet Take 20 mg by mouth daily. 05/01/23   [provider]  Turmeric 500 MG CAPS Take 500 mg by mouth 2 (two) times daily with a meal. extract    [provider]    Allergies: Amoxicillin, Clarithromycin, Levofloxacin, Atropine, and Amoxicillin-pot clavulanate    Review of Systems  Gastrointestinal:  Positive for diarrhea.  Neurological:  Positive for dizziness.    Updated Vital Signs BP (!) 125/46   Pulse 85   Temp 98 F (36.7 C)   Resp 16   SpO2 (!) 89%   Physical Exam Vitals and nursing note reviewed.  Constitutional:      General: She is not in acute distress.    Appearance: She is not toxic-appearing.  HENT:     Head: Normocephalic.     Nose: Nose normal.     Mouth/Throat:     Mouth: Mucous membranes are moist.  Eyes:     Conjunctiva/sclera: Conjunctivae normal.  Cardiovascular:     Rate and Rhythm: Normal rate.  Pulmonary:     Effort: Pulmonary effort is normal.     Breath sounds: Normal breath sounds.  Abdominal:     General: Abdomen is flat. There is no distension.     Tenderness: There is no abdominal tenderness. There is no guarding or rebound.  Musculoskeletal:        General: Normal range of motion.  Skin:    General: Skin is warm and dry.     Capillary Refill: Capillary refill takes less than 2 seconds.  Neurological:     Mental Status: She is alert and oriented to person, place, and time.  Psychiatric:        Mood and Affect: Mood normal.        Behavior: Behavior normal.     (all labs ordered are listed, but only abnormal results are displayed) Labs Reviewed  CBC WITH DIFFERENTIAL/PLATELET - Abnormal; Notable for the following components:      Result Value   RBC  3.27 (*)    Hemoglobin 10.5 (*)    HCT 31.9 (*)    All other components within normal limits  COMPREHENSIVE METABOLIC PANEL WITH GFR - Abnormal; Notable for the following components:   Glucose, Bld 133 (*)    BUN 48 (*)    Creatinine, Ser 2.14 (*)    Calcium  8.2 (*)    Total Protein 5.9 (*)    Albumin  3.2 (*)    AST 13 (*)    GFR, Estimated 24 (*)    All other components  within normal limits  URINALYSIS, ROUTINE W REFLEX MICROSCOPIC - Abnormal; Notable for the following components:   Color, Urine STRAW (*)    Specific Gravity, Urine 1.002 (*)    Glucose, UA >=500 (*)    Hgb urine dipstick MODERATE (*)    Leukocytes,Ua TRACE (*)    Bacteria, UA RARE (*)    All other components within normal limits  I-STAT CG4 LACTIC ACID, ED - Abnormal; Notable for the following components:   Lactic Acid, Venous 2.5 (*)    All other components within normal limits  I-STAT CG4 LACTIC ACID, ED - Abnormal; Notable for the following components:   Lactic Acid, Venous 3.2 (*)    All other components within normal limits  POC OCCULT BLOOD, ED - Abnormal; Notable for the following components:   Fecal Occult Bld POSITIVE (*)    All other components within normal limits  LIPASE, BLOOD  OCCULT BLOOD X 1 CARD TO LAB, STOOL  I-STAT CG4 LACTIC ACID, ED  I-STAT CG4 LACTIC ACID, ED    EKG: EKG Interpretation Date/Time:  Saturday April 03 2024 10:27:34 EDT Ventricular Rate:  75 PR Interval:  139 QRS Duration:  112 QT Interval:  408 QTC Calculation: 456 R Axis:   15  Text Interpretation: Sinus rhythm Borderline intraventricular conduction delay Abnrm T, consider ischemia, anterolateral lds Confirmed by Neysa Clap [id number]) on 04/03/2024 12:21:52 PM  Radiology: CT ANGIO GI BLEED Result Date: 04/03/2024 CLINICAL DATA:  Diarrhea for 2 days, history of colitis, concern for mesenteric ischemia EXAM: CTA ABDOMEN AND PELVIS WITHOUT AND WITH CONTRAST TECHNIQUE: Multidetector CT imaging of the abdomen and pelvis  was performed using the standard protocol during bolus administration of intravenous contrast. Multiplanar reconstructed images and MIPs were obtained and reviewed to evaluate the vascular anatomy. RADIATION DOSE REDUCTION: This exam was performed according to the departmental dose-optimization program which includes automated exposure control, adjustment of the mA and/or kV according to patient size and/or use of iterative reconstruction technique. CONTRAST:  80mL OMNIPAQUE  IOHEXOL  350 MG/ML SOLN COMPARISON:  None Available. FINDINGS: VASCULAR Normal contour and caliber of the abdominal aorta. No evidence of aortic aneurysm, dissection, or other acute aortic pathology. Severe mixed aortic atherosclerosis. Standard branching pattern of the abdominal aorta with solitary bilateral renal arteries. 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 Remaining branch vessel origins are patent. Review of the MIP images confirms the above findings. NON-VASCULAR Lower Chest: No acute findings.  Small hiatal hernia. Hepatobiliary: No focal liver abnormality is seen. Mildly coarse contour of the liver. Hepatic steatosis. Status post cholecystectomy. No biliary dilatation. Pancreas: Unremarkable. No pancreatic ductal dilatation or surrounding inflammatory changes. Spleen: Normal in size without significant abnormality. Adrenals/Urinary Tract: Adrenal glands are unremarkable. Kidneys are normal, without renal calculi, solid lesion, or hydronephrosis. Bladder is unremarkable. Stomach/Bowel: Stomach is within normal limits. The colon is partially fluid-filled, with long segment wall thickening involving the sigmoid colon and rectum (series 6, image 173, 189). Radiodense ingested  debris in the cecal base, which is present on precontrast images and does not reflect arterial contrast extravasation (series 4, image 66). Lymphatic: No enlarged abdominal or pelvic lymph nodes. Reproductive: No mass or other significant abnormality. Other: Fat containing umbilical hernia.  No ascites. Musculoskeletal: No acute osseous findings. IMPRESSION:  1. The superior mesenteric and inferior mesenteric arteries are patent and opacified through their proximal order branches. 2. 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 3. The colon is partially fluid-filled, with long segment wall thickening involving the sigmoid colon and rectum, consistent with nonspecific infectious, inflammatory, or ischemic colitis. 4. Hepatic steatosis and mildly coarse contour of the liver, suggestive of cirrhosis. Aortic Atherosclerosis (ICD10-I70.0). Electronically Signed   By: Marolyn JONETTA Jaksch M.D.   On: 04/03/2024 13:12     Procedures   Medications Ordered in the ED  acetaminophen  (TYLENOL ) tablet 650 mg (has no administration in time range)    Or  acetaminophen  (TYLENOL ) suppository 650 mg (has no administration in time range)  ondansetron  (ZOFRAN ) tablet 4 mg (has no administration in time range)    Or  ondansetron  (ZOFRAN ) injection 4 mg (has no administration in time range)  albuterol  (PROVENTIL ) (2.5 MG/3ML) 0.083% nebulizer solution 2.5 mg (has no administration in time range)  lactated ringers  infusion (has no administration in time range)  lactated ringers  bolus 1,000 mL (0 mLs Intravenous Stopped 04/03/24 1133)  lactated ringers  bolus 1,000 mL (1,000 mLs Intravenous New Bag/Given 04/03/24 1300)  iohexol  (OMNIPAQUE ) 350 MG/ML injection 80 mL (80 mLs Intravenous Contrast Given 04/03/24 1250)   pantoprazole  (PROTONIX ) injection 40 mg (40 mg Intravenous Given 04/03/24 1348)    Clinical Course as of 04/03/24 1458  Sat Apr 03, 2024  0906 Echo in 2023 with EF of 60% per my chart review.  [TY]  1133 Patient with uptrending lactate.  Hemoccult positive and has almost 2 point drop in hemoglobin over the past 2 months.  Will get CTA to evaluate for GI bleed, mesenteric ischemia, obstruction.  Does have slight elevation in creatinine above her baseline CKD.  She has received IV fluids.  Feel the benefit of contrast outweighs the risk.  Discussed risks with patient who is in agreement to proceed with CT scan. [TY]  1456 CT scan with no acute obstruction.  No active bleed.  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 [TY]  1457 Given patient's drop in hemoglobin with Hemoccult positive stool with persistent dark diarrhea will admit for observation.  Case discussed with hospitalist who agrees to admit. [TY]    Clinical Course User Index [TY] Neysa Caron PARAS, DO                                 Medical Decision Making This is a 73 year old female presenting emergency department for lightheadedness and diarrhea.  Vital signs reassuring.  Physical exam with benign nontender abdomen.  I suspect symptoms are self-inflicted secondary to large quantities of prunes and laxatives she was taking.  Will get basic labs, EKG.  Give IV fluids.  See ED course for further MDM disposition.  Amount and/or Complexity of Data Reviewed External Data Reviewed:     Details: Not on blood thinner Labs: ordered. Decision-making details documented in ED Course. Radiology: ordered and independent interpretation performed.    Details: I do not appreciate obvious free air ECG/medicine tests: ordered.  Risk Prescription drug management. Decision regarding hospitalization. Diagnosis or treatment significantly limited by social determinants of health. Risk Details: Poor  health literacy  Final diagnoses:  None    ED Discharge Orders     None          Neysa Caron PARAS, DO 04/03/24 1458

## 2024-04-03 NOTE — ED Notes (Signed)
 Pt continues to request ice chips, spoke with Dr Neysa due to pending CT Angi to r/o GI bleed, she can have a few at this time then none until test results are back. Staff has been made aware and 1/4 cup of chips have been provided.

## 2024-04-03 NOTE — H&P (Addendum)
 History and Physical  Kristin Gilmore FMW:[id number] DOB: 23-May-1951 DOA: 04/03/2024  PCP: [name], DO   Chief Complaint: Diarrhea, weakness  HPI: Kristin Gilmore is a 73 y.o. female with medical history significant for Parkinson's disease, hypertension, being admitted to the hospital with 2 days of copious diarrhea found to have colitis and possible lower GI bleed.  History is provided by the patient, who states that she was constipated for the last few days which is a chronic problem for her, took some over-the-counter stool softeners, prune juice, etc. and in the last day and a half she has had copious watery diarrhea.  Did not notice any blood, denies any dark tarry stools.  She takes aspirin  daily but has not taken it for a couple of days.  She came to the ER for evaluation today because she has been feeling incredibly weak, feeling dizzy and tired, no appetite for a couple of days, and even walking a few steps to her bathroom is incredibly exhausting for her.  She denies any fevers or chills, has some mild diffuse abdominal tenderness but states that this is chronic.  Workup in the emergency department as detailed below shows evidence of mild dehydration, as well as a 2 point drop in her hemoglobin.  Stool guaiac in the emergency department is positive.  ER provider discussed with gastroenterology for consultation, and hospitalist admission was requested.  Review of Systems: Please see HPI for pertinent positives and negatives. A complete 10 system review of systems are otherwise negative.  Past Medical History:  Diagnosis Date   Anxiety    Arthritis    Complication of anesthesia    It didn't knock me out quick enough.   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    Pneumonia    Pre-diabetes    i was told i was pre-diabetic a while ago   Tremor 04/13/2014   Past Surgical History:  Procedure Laterality Date   CHOLECYSTECTOMY     COLONOSCOPY W/ POLYPECTOMY     LUMBAR LAMINECTOMY  07/2020   3 level decompression - [place], [place]   LUMBAR LAMINECTOMY/DECOMPRESSION MICRODISCECTOMY N/A 12/19/2017   Procedure: LAMINECTOMY LUMBAR TWO- LUMBAR THREE, LUMBAR THREE- LUMBAR FOUR, LUMBAR FOUR- LUMBAR FIVE ;  Surgeon: [name], MD;  Location: [facility] OR;  Service: Neurosurgery;  Laterality: N/A;   NASAL SINUS SURGERY     RADIOLOGY WITH ANESTHESIA N/A 05/30/2022   Procedure: MRI WITH ANESTHESIA LUMBAR SPINE W/WO CONSTRAST;  Surgeon: Radiologist, Medication, MD;  Location: [facility] OR;  Service: Radiology;  Laterality: N/A;   TEE WITHOUT CARDIOVERSION N/A 06/05/2022   Procedure: TRANSESOPHAGEAL ECHOCARDIOGRAM (TEE);  Surgeon: [name], MD;  Location: [facility] ENDOSCOPY;  Service: Cardiovascular;  Laterality: N/A;   TONSILLECTOMY     TOTAL KNEE ARTHROPLASTY Right 06/26/2021   Procedure: RIGHT TOTAL KNEE ARTHROPLASTY;  Surgeon: [name], MD;  Location: [facility] OR;  Service: Orthopedics;  Laterality: Right;   WISDOM TOOTH EXTRACTION     Social History:  reports that she has never smoked. She has never used smokeless tobacco. She reports that she does not currently use alcohol. She reports that she does not use drugs.  Allergies  Allergen Reactions   Levofloxacin Other (See Comments)    Dizziness (intolerance)  Other Reaction(s): Dizziness  Amoxicillin Hives    UNSPECIFIED REACTION   Has patient had a PCN reaction causing immediate rash, facial/tongue/throat swelling, SOB or lightheadedness with hypotension: Unknown  Has patient had a PCN reaction causing severe rash involving mucus membranes or skin necrosis: Unknown  Has patient had a PCN reaction that required hospitalization: Unknown  Has patient had a PCN reaction occurring within the last 10 years: No  If  all of the above answers are NO, then may proceed with Cephalosporin use.  Sweats  Other Reaction(s): Unknown   Clarithromycin Hives, Other (See Comments) and Rash    Other Reaction(s): GI Intolerance, Other (See Comments), Unknown   Atropine Hives    Family History  Problem Relation Age of Onset   Cancer Father        stomach cancer   Migraines Mother      Prior to Admission medications   Medication Sig Start Date End Date Taking? Authorizing Provider  AIMOVIG  140 MG/ML SOAJ Inject 140 mg into the skin every 30 (thirty) days.    [provider]  amitriptyline  (ELAVIL ) 10 MG tablet Take 1 tablet (10 mg total) by mouth at bedtime as needed for sleep. Patient taking differently: Take 25 mg by mouth at bedtime. 06/08/22   [name], MD  amLODipine  (NORVASC ) 5 MG tablet Take 5 mg by mouth 2 (two) times daily. 07/27/18   [provider]  aspirin  EC 81 MG tablet Take 81 mg by mouth daily. Swallow whole.    [provider]  Azelastine HCl 137 MCG/SPRAY SOLN Place 1 spray into both nostrils in the morning and at bedtime. AS DIRECTED 01/08/24   [provider]  bisacodyl  (DULCOLAX) 10 MG suppository Place 10 mg rectally as needed for moderate constipation.    [provider]  bisacodyl  (DULCOLAX) 5 MG EC tablet Take 5-15 mg by mouth at bedtime as needed (constipation).    [provider]  Black Cohosh 200 MG CAPS Take 200 mg by mouth daily.    [provider]  Calcium  Citrate-Vitamin D  (CALCIUM  CITRATE + D PO) Take 1-2 capsules by mouth See admin instructions. Calcium  200 mg, vitamin D3 2.5 mcg - 100 units - take 1 tablet by mouth with breakfast and 2 tablets with supper    [provider]  carbidopa -levodopa  (SINEMET  IR) 25-100 MG tablet Take 1 tablet by mouth 3 (three) times daily. 12/25/22   [provider]  carvedilol  (COREG ) 12.5 MG tablet Take 12.5 mg by mouth 2 (two) times daily. 03/24/23    [provider]  Cholecalciferol  (VITAMIN D3) 50 MCG (2000 UT) capsule Take 2,000 Units by mouth daily.    [provider]  Coenzyme Q10 (COQ10) 100 MG CAPS Take 100 mg by mouth daily.    [provider]  divalproex  (DEPAKOTE  ER) 250 MG 24 hr tablet Take 1 tablet (250 mg total) by mouth 2 (two) times daily. Patient taking differently: Take 250 mg by mouth daily. 01/19/24   Cottle, Lorene KANDICE Mickey., MD  docusate sodium  (COLACE) 100 MG capsule Take 300 mg by mouth at bedtime as needed for moderate constipation.    [provider]  HYDROcodone -acetaminophen  (NORCO/VICODIN) 5-325 MG tablet Take 1 tablet by mouth every 6 (six) hours as needed for severe pain (pain score 7-10). Patient not taking: Reported on 03/19/2024 01/29/24   Geiple, Joshua, PA-C  levocetirizine (XYZAL) 5 MG tablet Take 5 mg by mouth every evening.    [provider]  Menthol , Topical  Analgesic, (BIOFREEZE) 4 % GEL Apply 1 application  topically 3 (three) times daily as needed (pain).    [provider]  metoCLOPramide  (REGLAN ) 10 MG tablet Take 1 tablet (10 mg total) by mouth every 6 (six) hours. Patient not taking: Reported on 03/19/2024 01/29/24   Desiderio Chew, PA-C  nitroGLYCERIN  (NITROSTAT ) 0.4 MG SL tablet Place 0.4 mg under the tongue every 5 (five) minutes as needed for chest pain.    [provider]  Nutritional Supplements (BRAIN SUPPORT) CAPS Take 1 capsule by mouth in the morning and at bedtime.    [provider]  pregabalin  (LYRICA ) 75 MG capsule TAKE 1 CAPSULE BY MOUTH THREE TIMES A DAY Patient taking differently: Take 75 mg by mouth 2 (two) times daily. 11/26/23   Georgina Ozell LABOR, MD  QUEtiapine  (SEROQUEL ) 300 MG tablet TAKE 0.5 TABLETS (150 MG TOTAL) BY MOUTH AT BEDTIME AS NEEDED. 03/02/24   Cottle, Lorene KANDICE Raddle., MD  rosuvastatin  (CRESTOR ) 20 MG tablet Take 20 mg by mouth daily. 05/01/23   [provider]  Turmeric 500 MG CAPS Take 500 mg by mouth 2  (two) times daily with a meal. extract    [provider]    Physical Exam: BP (!) 148/86   Pulse 83   Temp 98 F (36.7 C)   Resp (!) 22   SpO2 95%  General:  Alert, oriented, calm, in no acute distress, resting tremor Eyes: EOMI, clear conjuctivae, white sclerea Neck: supple, no masses, trachea mildline  Cardiovascular: RRR, no murmurs or rubs, no peripheral edema  Respiratory: clear to auscultation bilaterally, no wheezes, no crackles  Abdomen: soft, minimal tenderness, slightly distended, normal bowel tones heard  Skin: dry, no rashes  Musculoskeletal: no joint effusions, normal range of motion  Psychiatric: appropriate affect, normal speech  Neurologic: extraocular muscles intact, clear speech, moving all extremities with intact sensorium         Labs on Admission:  Basic Metabolic Panel: Recent Labs  Lab 04/03/24 0927  NA 136  K 5.1  CL 99  CO2 23  GLUCOSE 133*  BUN 48*  CREATININE 2.14*  CALCIUM  8.2*   Liver Function Tests: Recent Labs  Lab 04/03/24 0927  AST 13*  ALT <5  ALKPHOS 84  BILITOT 1.0  PROT 5.9*  ALBUMIN  3.2*   Recent Labs  Lab 04/03/24 0927  LIPASE 23   No results for input(s): AMMONIA in the last 168 hours. CBC: Recent Labs  Lab 04/03/24 0927  WBC 7.2  NEUTROABS 5.7  HGB 10.5*  HCT 31.9*  MCV 97.6  PLT 175   Cardiac Enzymes: No results for input(s): CKTOTAL, CKMB, CKMBINDEX, TROPONINI in the last 168 hours. BNP (last 3 results) No results for input(s): BNP in the last 8760 hours.  ProBNP (last 3 results) No results for input(s): PROBNP in the last 8760 hours.  CBG: No results for input(s): GLUCAP in the last 168 hours.  Radiological Exams on Admission: CT ANGIO GI BLEED Result Date: 04/03/2024 CLINICAL DATA:  Diarrhea for 2 days, history of colitis, concern for mesenteric ischemia EXAM: CTA ABDOMEN AND PELVIS WITHOUT AND WITH CONTRAST TECHNIQUE: Multidetector CT imaging of the abdomen and pelvis  was performed using the standard protocol during bolus administration of intravenous contrast. Multiplanar reconstructed images and MIPs were obtained and reviewed to evaluate the vascular anatomy. RADIATION DOSE REDUCTION: This exam was performed according to the departmental dose-optimization program which includes automated exposure control, adjustment of the mA and/or kV according to  patient size and/or use of iterative reconstruction technique. CONTRAST:  80mL OMNIPAQUE  IOHEXOL  350 MG/ML SOLN COMPARISON:  None Available. FINDINGS: VASCULAR Normal contour and caliber of the abdominal aorta. No evidence of aortic aneurysm, dissection, or other acute aortic pathology. Severe mixed aortic atherosclerosis. Standard branching pattern of the abdominal aorta with solitary bilateral renal arteries. 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 Remaining branch vessel origins are patent. Review of the MIP images confirms the above findings. NON-VASCULAR Lower Chest: No acute findings.  Small hiatal hernia. Hepatobiliary: No focal liver abnormality is seen. Mildly coarse contour of the liver. Hepatic steatosis. Status post cholecystectomy. No biliary dilatation. Pancreas: Unremarkable. No pancreatic ductal dilatation or surrounding inflammatory changes. Spleen: Normal in size without significant abnormality. Adrenals/Urinary Tract: Adrenal glands are unremarkable. Kidneys are normal, without renal calculi, solid lesion, or hydronephrosis. Bladder is unremarkable. Stomach/Bowel: Stomach is within normal limits. The colon is partially fluid-filled, with long segment wall thickening involving the sigmoid colon and rectum (series 6, image 173, 189). Radiodense ingested  debris in the cecal base, which is present on precontrast images and does not reflect arterial contrast extravasation (series 4, image 66). Lymphatic: No enlarged abdominal or pelvic lymph nodes. Reproductive: No mass or other significant abnormality. Other: Fat containing umbilical hernia.  No ascites. Musculoskeletal: No acute osseous findings. IMPRESSION: 1. The superior mesenteric and inferior mesenteric arteries are patent and opacified through their proximal order branches. 2. 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 3. The colon is partially fluid-filled, with long segment wall thickening involving the sigmoid colon and rectum, consistent with nonspecific infectious, inflammatory, or ischemic colitis. 4. Hepatic steatosis and mildly coarse contour of the liver, suggestive of cirrhosis. Aortic Atherosclerosis (ICD10-I70.0). Electronically Signed   By: Marolyn JONETTA Jaksch M.D.   On: 04/03/2024 13:12   Assessment/Plan Kristin Gilmore is a 73 y.o. female with medical history significant for Parkinson's disease, hypertension, being admitted to the hospital with 2 days of copious diarrhea found to have colitis and possible lower GI bleed.   Colitis-possibly inflammatory from iatrogenic diarrhea, less likely ischemic colitis or infectious diarrhea.  She has some mild diffuse abdominal pain but this is chronic for her. -Observation admission -Clear liquid diet -Supportive care with IV fluids, pain and nausea medication  Lower GI bleed-with 2 point drop in her hemoglobin, though she has not seen any evidence of GI bleeding, she is Hemoccult positive -Hold aspirin , last took on approximately 7/3 -Avoid blood thinners -Trend hemoglobin  Lactic acidosis-likely due to dehydration, patient  is not septic  Acute kidney injury-superimposed on CKD stage III, likely due to dehydration from her copious diarrhea and colitis -Will hydrate with LR, and monitor daily labs  Parkinson's disease-continue Sinemet   Hypertension-amlodipine , Coreg   Hyperlipidemia-Crestor   DVT prophylaxis: SCD's only    Code Status: Full Code  Consults called: ER provider consulted [facility]  Admission status: Observation  Time spent: 48 minutes  Linell Meldrum CHRISTELLA Gail MD [facility] Pager [phone]  If 7PM-7AM, please contact night-coverage [email or web address] Password [email or web address]  04/03/2024, 1:51 PM

## 2024-04-03 NOTE — ED Notes (Addendum)
 Pt has been given ice chips by RN beginning shortly after arrival per provider. Pt repeatedly asks for ice chips. Please make a note if you have given pt ice chips as not to overly give ice chips.

## 2024-04-04 DIAGNOSIS — K922 Gastrointestinal hemorrhage, unspecified: Secondary | ICD-10-CM | POA: Diagnosis not present

## 2024-04-04 DIAGNOSIS — D649 Anemia, unspecified: Secondary | ICD-10-CM | POA: Diagnosis not present

## 2024-04-04 DIAGNOSIS — R195 Other fecal abnormalities: Secondary | ICD-10-CM | POA: Diagnosis not present

## 2024-04-04 DIAGNOSIS — N179 Acute kidney failure, unspecified: Secondary | ICD-10-CM | POA: Diagnosis not present

## 2024-04-04 LAB — CBC
HCT: 29.3 % — ABNORMAL LOW (ref 36.0–46.0)
Hemoglobin: 9.7 g/dL — ABNORMAL LOW (ref 12.0–15.0)
MCH: 32.1 pg (ref 26.0–34.0)
MCHC: 33.1 g/dL (ref 30.0–36.0)
MCV: 97 fL (ref 80.0–100.0)
Platelets: 178 K/uL (ref 150–400)
RBC: 3.02 MIL/uL — ABNORMAL LOW (ref 3.87–5.11)
RDW: 14.8 % (ref 11.5–15.5)
WBC: 9.2 K/uL (ref 4.0–10.5)
nRBC: 0 % (ref 0.0–0.2)

## 2024-04-04 LAB — BASIC METABOLIC PANEL WITH GFR
Anion gap: 12 (ref 5–15)
BUN: 33 mg/dL — ABNORMAL HIGH (ref 8–23)
CO2: 24 mmol/L (ref 22–32)
Calcium: 8.3 mg/dL — ABNORMAL LOW (ref 8.9–10.3)
Chloride: 100 mmol/L (ref 98–111)
Creatinine, Ser: 1.63 mg/dL — ABNORMAL HIGH (ref 0.44–1.00)
GFR, Estimated: 33 mL/min — ABNORMAL LOW (ref >60)
Glucose, Bld: 118 mg/dL — ABNORMAL HIGH (ref 70–99)
Potassium: 4.5 mmol/L (ref 3.5–5.1)
Sodium: 136 mmol/L (ref 135–145)

## 2024-04-04 MED ORDER — ROSUVASTATIN CALCIUM 20 MG PO TABS
20.0000 mg | ORAL_TABLET | Freq: Every day | ORAL | Status: DC
  Administered 2024-04-05 – 2024-04-08 (×4): 20 mg via ORAL
  Filled 2024-04-04 (×4): qty 1

## 2024-04-04 MED ORDER — PREGABALIN 75 MG PO CAPS
75.0000 mg | ORAL_CAPSULE | Freq: Two times a day (BID) | ORAL | Status: DC
  Administered 2024-04-04 – 2024-04-08 (×8): 75 mg via ORAL
  Filled 2024-04-04 (×8): qty 1

## 2024-04-04 MED ORDER — WITCH HAZEL-GLYCERIN EX PADS
MEDICATED_PAD | CUTANEOUS | Status: DC | PRN
  Administered 2024-04-06: 1 via TOPICAL
  Filled 2024-04-04 (×2): qty 100

## 2024-04-04 MED ORDER — CARBIDOPA-LEVODOPA 25-100 MG PO TABS
1.0000 | ORAL_TABLET | Freq: Three times a day (TID) | ORAL | Status: DC
  Administered 2024-04-04 – 2024-04-08 (×12): 1 via ORAL
  Filled 2024-04-04 (×12): qty 1

## 2024-04-04 MED ORDER — CARVEDILOL 12.5 MG PO TABS
12.5000 mg | ORAL_TABLET | Freq: Two times a day (BID) | ORAL | Status: DC
  Administered 2024-04-04 – 2024-04-07 (×7): 12.5 mg via ORAL
  Filled 2024-04-04 (×8): qty 1

## 2024-04-04 MED ORDER — MELATONIN 5 MG PO TABS
5.0000 mg | ORAL_TABLET | Freq: Every evening | ORAL | Status: AC | PRN
  Administered 2024-04-05 – 2024-04-06 (×3): 5 mg via ORAL
  Filled 2024-04-04 (×3): qty 1

## 2024-04-04 NOTE — Consult Note (Signed)
 Referring Provider: Dr. Briana Primary Care Physician:  [name], DO Primary Gastroenterologist:  Sampson  Reason for Consultation:  GI bleed  HPI: Kristin Gilmore is a 73 y.o. female with multiple medical problems who became very dizzy and weak prior to coming to ER who has chronic constipation and took laxatives followed by 2 days of nonbloody diarrhea. Denies associated abdominal pain, rectal bleeding or melena. Hgb 9.7 (10.5 on admit and 12.3 on 01/30/24). No BMs since admit. Reports having a colonoscopy by Dr. Kristie but does not know when.  Past Medical History:  Diagnosis Date   Anxiety    Arthritis    Complication of anesthesia    It didn't knock me out quick enough.   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   Pneumonia    Pre-diabetes    i was told i was pre-diabetic a while ago   Tremor 04/13/2014    Past Surgical History:  Procedure Laterality Date   CHOLECYSTECTOMY     COLONOSCOPY W/ POLYPECTOMY     LUMBAR LAMINECTOMY  07/2020   3 level decompression - [place], [place]   LUMBAR LAMINECTOMY/DECOMPRESSION MICRODISCECTOMY N/A 12/19/2017   Procedure: LAMINECTOMY LUMBAR TWO- LUMBAR THREE, LUMBAR THREE- LUMBAR FOUR, LUMBAR FOUR- LUMBAR FIVE ;  Surgeon: [name], MD;  Location: [facility] OR;  Service: Neurosurgery;  Laterality: N/A;   NASAL SINUS SURGERY     RADIOLOGY WITH ANESTHESIA N/A 05/30/2022   Procedure: MRI WITH ANESTHESIA LUMBAR SPINE W/WO CONSTRAST;  Surgeon: Radiologist, Medication, MD;  Location: [facility] OR;  Service: Radiology;  Laterality: N/A;   TEE WITHOUT CARDIOVERSION N/A 06/05/2022   Procedure: TRANSESOPHAGEAL ECHOCARDIOGRAM (TEE);  Surgeon: [name], MD;  Location: [facility] ENDOSCOPY;  Service: Cardiovascular;  Laterality: N/A;    TONSILLECTOMY     TOTAL KNEE ARTHROPLASTY Right 06/26/2021   Procedure: RIGHT TOTAL KNEE ARTHROPLASTY;  Surgeon: [name], MD;  Location: [facility] OR;  Service: Orthopedics;  Laterality: Right;   WISDOM TOOTH EXTRACTION      Prior to Admission medications   Medication Sig Start Date End Date Taking? Authorizing Provider  AIMOVIG  140 MG/ML SOAJ Inject 140 mg into the skin every 30 (thirty) days.    [provider]  amitriptyline  (ELAVIL ) 10 MG tablet Take 1 tablet (10 mg total) by mouth at bedtime as needed for sleep. Patient taking differently: Take 25 mg by mouth at bedtime. 06/08/22   [name], MD  amLODipine  (NORVASC ) 5 MG tablet Take 5 mg by mouth 2 (two) times daily. 07/27/18   [provider]  aspirin  EC 81 MG tablet Take 81 mg by mouth daily. Swallow whole.    [provider]  Azelastine HCl 137 MCG/SPRAY SOLN Place 1 spray into both nostrils in the morning and at bedtime. AS DIRECTED 01/08/24   [provider]  bisacodyl  (DULCOLAX) 10 MG suppository Place 10 mg rectally as needed for moderate constipation.    [provider]  bisacodyl  (DULCOLAX) 5 MG EC tablet Take 5-15 mg by mouth at bedtime as needed (constipation).    [provider]  Black Cohosh 200 MG CAPS Take 200 mg by mouth daily.    [provider]  Calcium  Citrate-Vitamin D  (CALCIUM  CITRATE + D PO) Take 1-2 capsules by mouth See admin instructions.  Calcium  200 mg, vitamin D3 2.5 mcg - 100 units - take 1 tablet by mouth with breakfast and 2 tablets with supper    [provider]  carbidopa -levodopa  (SINEMET  IR) 25-100 MG tablet Take 1 tablet by mouth 3 (three) times daily. 12/25/22   [provider]  carvedilol  (COREG ) 12.5 MG tablet Take 12.5 mg by mouth 2 (two) times daily. 03/24/23   [provider]  Cholecalciferol  (VITAMIN D3) 50 MCG (2000 UT) capsule Take 2,000 Units by mouth daily.    [provider]  Coenzyme Q10  (COQ10) 100 MG CAPS Take 100 mg by mouth daily.    [provider]  divalproex  (DEPAKOTE  ER) 250 MG 24 hr tablet Take 1 tablet (250 mg total) by mouth 2 (two) times daily. Patient taking differently: Take 250 mg by mouth daily. 01/19/24   Cottle, Lorene KANDICE Raddle., MD  docusate sodium  (COLACE) 100 MG capsule Take 300 mg by mouth at bedtime as needed for moderate constipation.    [provider]  HYDROcodone -acetaminophen  (NORCO/VICODIN) 5-325 MG tablet Take 1 tablet by mouth every 6 (six) hours as needed for severe pain (pain score 7-10). Patient not taking: Reported on 03/19/2024 01/29/24   Geiple, Joshua, PA-C  levocetirizine (XYZAL) 5 MG tablet Take 5 mg by mouth every evening.    [provider]  Menthol , Topical Analgesic, (BIOFREEZE) 4 % GEL Apply 1 application  topically 3 (three) times daily as needed (pain).    [provider]  metoCLOPramide  (REGLAN ) 10 MG tablet Take 1 tablet (10 mg total) by mouth every 6 (six) hours. 01/29/24   Desiderio Chew, PA-C  nitroGLYCERIN  (NITROSTAT ) 0.4 MG SL tablet Place 0.4 mg under the tongue every 5 (five) minutes as needed for chest pain.    [provider]  Nutritional Supplements (BRAIN SUPPORT) CAPS Take 1 capsule by mouth in the morning and at bedtime.    [provider]  pregabalin  (LYRICA ) 75 MG capsule TAKE 1 CAPSULE BY MOUTH THREE TIMES A DAY Patient taking differently: Take 75 mg by mouth 2 (two) times daily. 11/26/23   Georgina Ozell LABOR, MD  QUEtiapine  (SEROQUEL ) 300 MG tablet TAKE 0.5 TABLETS (150 MG TOTAL) BY MOUTH AT BEDTIME AS NEEDED. 03/02/24   Cottle, Lorene KANDICE Raddle., MD  rosuvastatin  (CRESTOR ) 20 MG tablet Take 20 mg by mouth daily. 05/01/23   [provider]  Turmeric 500 MG CAPS Take 500 mg by mouth 2 (two) times daily with a meal. extract    [provider]    Scheduled Meds: Continuous Infusions: PRN Meds:.acetaminophen  **OR** acetaminophen , albuterol , ondansetron  **OR** ondansetron   (ZOFRAN ) IV, oxyCODONE   Allergies as of 04/03/2024 - Review Complete 04/03/2024  Allergen Reaction Noted   Amoxicillin Hives and Rash 12/19/2014   Clarithromycin Hives, Other (See Comments), and Rash 12/19/2014   Levofloxacin Other (See Comments) 04/18/2021   Atropine Hives and Rash 10/27/2018   Amoxicillin-pot clavulanate Rash, Dermatitis, and Hives 02/14/2016    Family History  Problem Relation Age of Onset   Cancer Father        stomach cancer   Migraines Mother     Social History   Socioeconomic History   Marital status: Widowed    Spouse name: [name]   Number of children: 1   Years of education: college   Highest education level: Not on file  Occupational History   Occupation: Retired  Tobacco Use   Smoking status: Never   Smokeless tobacco: Never  Vaping Use   Vaping  status: Never Used  Substance and Sexual Activity   Alcohol use: Not Currently   Drug use: No   Sexual activity: Not on file  Other Topics Concern   Not on file  Social History Narrative   Lives at home, married   Patient does not drink caffeine.   Patient is right handed.   Social Drivers of Health   Financial Resource Strain: Patient Declined (02/26/2024)   [facility]   Overall Financial Resource Strain (CARDIA)    Difficulty of Paying Living Expenses: Patient declined  Food Insecurity: No Food Insecurity (04/03/2024)   Hunger Vital Sign    Worried About Running Out of Food in the Last Year: Never true    Ran Out of Food in the Last Year: Never true  Transportation Needs: No Transportation Needs (04/03/2024)   PRAPARE - Administrator, Civil Service (Medical): No    Lack of Transportation (Non-Medical): No  Physical Activity: Not on file  Stress: Not on file  Social Connections: Moderately Integrated (04/03/2024)   Social Connection and Isolation Panel    Frequency of Communication with Friends and Family: Three times a week    Frequency of Social Gatherings with  Friends and Family: Three times a week    Attends Religious Services: More than 4 times per year    Active Member of Clubs or Organizations: No    Attends Banker Meetings: More than 4 times per year    Marital Status: Widowed  Intimate Partner Violence: Not At Risk (04/03/2024)   Humiliation, Afraid, Rape, and Kick questionnaire    Fear of Current or Ex-Partner: No    Emotionally Abused: No    Physically Abused: No    Sexually Abused: No    Review of Systems: All negative except as stated above in HPI.  Physical Exam: Vital signs: Vitals:   04/04/24 0002 04/04/24 0448  BP: 116/89 (!) 125/52  Pulse: 90 85  Resp:    Temp: 98.4 F (36.9 C) 98.3 F (36.8 C)  SpO2: 93% (!) 89%  R 16  Last BM Date : 04/03/24 General:  lethargic, Well-developed, well-nourished, pleasant and cooperative in NAD Head: normocephalic, atraumatic Eyes: anicteric sclera ENT: oropharynx clear Neck: supple, nontender Lungs:  Clear throughout to auscultation.   No wheezes, crackles, or rhonchi. No acute distress. Heart:  Regular rate and rhythm; no murmurs, clicks, rubs,  or gallops. Abdomen: diffuse tenderness with guarding, soft, nondistended, +BS  Rectal:  Deferred Ext: no edema  GI:  Lab Results: Recent Labs    04/03/24 0927 04/04/24 0339  WBC 7.2 9.2  HGB 10.5* 9.7*  HCT 31.9* 29.3*  PLT 175 178   BMET Recent Labs    04/03/24 0927 04/04/24 0339  NA 136 136  K 5.1 4.5  CL 99 100  CO2 23 24  GLUCOSE 133* 118*  BUN 48* 33*  CREATININE 2.14* 1.63*  CALCIUM  8.2* 8.3*   LFT Recent Labs    04/03/24 0927  PROT 5.9*  ALBUMIN  3.2*  AST 13*  ALT <5  ALKPHOS 84  BILITOT 1.0   PT/INR No results for input(s): LABPROT, INR in the last 72 hours.   Studies/Results:  Impression/Plan: 73 yo with symptomatic anemia and heme positive stool without any overt bleeding. Need to find out when her last colonoscopy was by Dr. Kristie. Not convinced inpt endoscopic work up is  needed. Full liquid diet. Patient states that she is still actively seen by Dr. Kristie.  Will have my partner who is on tomorrow ([place]) decide whether to have Dr. Kristie take over this admit. Supportive care.    LOS: 0 days   Jerrell JAYSON Sol  04/04/2024, 1:10 PM  Questions please call [phone]

## 2024-04-04 NOTE — Progress Notes (Signed)
 PROGRESS NOTE    Kristin Gilmore  FMW:[id number] DOB: 1951-09-20 DOA: 04/03/2024 PCP: [name], DO   Brief Narrative: Kristin Gilmore is a 73 y.o. female with a history of Parkinson disease, hypertension, hyperlipidemia, neuropathy.  Patient presented secondary to diarrhea, weakness and lightheadedness and found to have evidence of colitis, AKI and heme positive stool concerning for GI bleeding. IV fluids started. GI consulted.   Assessment and Plan:  Colitis Associated diarrhea. Concern this could be iatrogenic secondary to patient's attempt to manage constipation. CT imaging significant for partially fluid-filled colon with long segment wall thickening involving the sigmoid colon and rectum.  Heme positive stool Noted on admission. Patient reports no recent history of melena or hematochezia. GI consulted with recommendation for conservative management for now.  Acute anemia Presumed secondary to GI bleeding. Baseline hemoglobin of 12.. Hemoglobin drift downward to 9.7 today. - CBC daily  AKI on CKD stage IIIa Baseline creatinine of about 1.2-1.5. Creatinine of 2.13 on admission. Improved to 1.63 after IV fluids.  Lactic acidosis Lactic acid of 2.5 on admission with peak of 3.2. Secondary to dehydration. Improved to 1.3 after IV fluids. Resolved.  Possible cirrhosis Noted on CT imaging. Evidence of hepatic steatosis as well.  Primary hypertension Patient is on amlodipine  and Coreg  as an outpatient which were held. - Resume Coreg   Hyperlipidemia Aortic atherosclerosis - Continue Crestor   Parkinson disease - Resume Sinemet   Mononeuropathy - Restart Lyrica    DVT prophylaxis: SCDs Code Status:   Code Status: Full Code Family Communication: None at bedside Disposition Plan: Discharge home likely in 1-3 days pending GI recommendations   Consultants:  Gastroenterology  Procedures:  None  Antimicrobials: None    Subjective: Patient without  issues this morning. No bowel movement.  Objective: BP 101/71 (BP Location: Right Arm)   Pulse 91   Temp 98.6 F (37 C) (Oral)   Resp 16   Ht 5' 7 (1.702 m)   Wt 88.5 kg   SpO2 (!) 86%   BMI 30.56 kg/m   Examination:  General exam: Appears calm and comfortable Respiratory system: Clear to auscultation. Respiratory effort normal. Cardiovascular system: S1 & S2 heard, RRR. No murmurs, rubs, gallops or clicks. Gastrointestinal system: Abdomen is nondistended, soft and nontender. Normal bowel sounds heard. Central nervous system: Alert and oriented. No focal neurological deficits. Musculoskeletal: No edema. No calf tenderness Psychiatry: Judgement and insight appear normal. Mood & affect appropriate.    Data Reviewed: I have personally reviewed following labs and imaging studies  CBC Lab Results  Component Value Date   WBC 9.2 04/04/2024   RBC 3.02 (L) 04/04/2024   HGB 9.7 (L) 04/04/2024   HCT 29.3 (L) 04/04/2024   MCV 97.0 04/04/2024   MCH 32.1 04/04/2024   PLT 178 04/04/2024   MCHC 33.1 04/04/2024   RDW 14.8 04/04/2024   LYMPHSABS 0.7 04/03/2024   MONOABS 0.7 04/03/2024   EOSABS 0.1 04/03/2024   BASOSABS 0.0 04/03/2024     Last metabolic panel Lab Results  Component Value Date   NA 136 04/04/2024   K 4.5 04/04/2024   CL 100 04/04/2024   CO2 24 04/04/2024   BUN 33 (H) 04/04/2024   CREATININE 1.63 (H) 04/04/2024   GLUCOSE 118 (H) 04/04/2024   GFRNONAA 33 (L) 04/04/2024   GFRAA >60 12/20/2017   CALCIUM  8.3 (L) 04/04/2024   PHOS 4.1 06/08/2022   PROT 5.9 (L) 04/03/2024   ALBUMIN  3.2 (L) 04/03/2024   LABGLOB 2.0 11/01/2015  AGRATIO 2.3 11/01/2015   BILITOT 1.0 04/03/2024   ALKPHOS 84 04/03/2024   AST 13 (L) 04/03/2024   ALT <5 04/03/2024   ANIONGAP 12 04/04/2024    GFR: Estimated Creatinine Clearance: 35.1 mL/min (A) (by C-G formula based on SCr of 1.63 mg/dL (H)).  No results found for this or any previous visit (from the past 240 hours).     Radiology Studies: DG HIP PORT UNILAT WITH PELVIS 1V LEFT Result Date: 04/03/2024 CLINICAL DATA:  Fall 1 week ago, hip pain EXAM: DG HIP (WITH OR WITHOUT PELVIS) 1V PORT LEFT COMPARISON:  05/31/2022 FINDINGS: Limited single AP view of the left hip. No visible fracture. No subluxation or dislocation. Soft tissues are intact. IMPRESSION: Limited study.  No visible fracture. Electronically Signed   By: Franky Crease M.D.   On: 04/03/2024 16:10   CT ANGIO GI BLEED Result Date: 04/03/2024 CLINICAL DATA:  Diarrhea for 2 days, history of colitis, concern for mesenteric ischemia EXAM: CTA ABDOMEN AND PELVIS WITHOUT AND WITH CONTRAST TECHNIQUE: Multidetector CT imaging of the abdomen and pelvis was performed using the standard protocol during bolus administration of intravenous contrast. Multiplanar reconstructed images and MIPs were obtained and reviewed to evaluate the vascular anatomy. RADIATION DOSE REDUCTION: This exam was performed according to the departmental dose-optimization program which includes automated exposure control, adjustment of the mA and/or kV according to patient size and/or use of iterative reconstruction technique. CONTRAST:  80mL OMNIPAQUE  IOHEXOL  350 MG/ML SOLN COMPARISON:  None Available. FINDINGS: VASCULAR Normal contour and caliber of the abdominal aorta. No evidence of aortic aneurysm, dissection, or other acute aortic pathology. Severe mixed aortic atherosclerosis. Standard branching pattern of the abdominal aorta with solitary bilateral renal arteries. 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 Remaining branch vessel origins are patent. Review of the MIP images confirms the above findings. NON-VASCULAR Lower Chest: No acute  findings.  Small hiatal hernia. Hepatobiliary: No focal liver abnormality is seen. Mildly coarse contour of the liver. Hepatic steatosis. Status post cholecystectomy. No biliary dilatation. Pancreas: Unremarkable. No pancreatic ductal dilatation or surrounding inflammatory changes. Spleen: Normal in size without significant abnormality. Adrenals/Urinary Tract: Adrenal glands are unremarkable. Kidneys are normal, without renal calculi, solid lesion, or hydronephrosis. Bladder is unremarkable. Stomach/Bowel: Stomach is within normal limits. The colon is partially fluid-filled, with long segment wall thickening involving the sigmoid colon and rectum (series 6, image 173, 189). Radiodense ingested debris in the cecal base, which is present on precontrast images and does not reflect arterial contrast extravasation (series 4, image 66). Lymphatic: No enlarged abdominal or pelvic lymph nodes. Reproductive: No mass or other significant abnormality. Other: Fat containing umbilical hernia.  No ascites. Musculoskeletal: No acute osseous findings. IMPRESSION: 1. The superior mesenteric and inferior mesenteric arteries are patent and opacified through their proximal order branches. 2. 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 3. The colon is partially fluid-filled, with long segment wall thickening involving the sigmoid colon and rectum, consistent with nonspecific infectious, inflammatory, or ischemic colitis. 4. Hepatic steatosis and mildly coarse contour of the liver, suggestive of cirrhosis. Aortic  Atherosclerosis (ICD10-I70.0). Electronically Signed   By: Marolyn JONETTA Jaksch M.D.   On: 04/03/2024 13:12      LOS: 0 days    Elgin Lam, MD [facility] 04/04/2024, 3:33 PM   If 7PM-7AM, please  contact night-coverage [email or web address]

## 2024-04-04 NOTE — Hospital Course (Signed)
 Kristin Gilmore is a 73 y.o. female with a history of Parkinson disease, hypertension, hyperlipidemia, neuropathy.  Patient presented secondary to diarrhea, weakness and lightheadedness and found to have evidence of colitis, AKI and heme positive stool concerning for GI bleeding. IV fluids started. GI consulted.

## 2024-04-05 ENCOUNTER — Observation Stay (HOSPITAL_COMMUNITY)

## 2024-04-05 DIAGNOSIS — K922 Gastrointestinal hemorrhage, unspecified: Secondary | ICD-10-CM | POA: Diagnosis not present

## 2024-04-05 DIAGNOSIS — N179 Acute kidney failure, unspecified: Secondary | ICD-10-CM | POA: Diagnosis not present

## 2024-04-05 DIAGNOSIS — Z981 Arthrodesis status: Secondary | ICD-10-CM | POA: Diagnosis not present

## 2024-04-05 DIAGNOSIS — R109 Unspecified abdominal pain: Secondary | ICD-10-CM | POA: Diagnosis not present

## 2024-04-05 DIAGNOSIS — R933 Abnormal findings on diagnostic imaging of other parts of digestive tract: Secondary | ICD-10-CM | POA: Diagnosis not present

## 2024-04-05 DIAGNOSIS — R197 Diarrhea, unspecified: Secondary | ICD-10-CM | POA: Diagnosis not present

## 2024-04-05 LAB — BASIC METABOLIC PANEL WITH GFR
Anion gap: 11 (ref 5–15)
BUN: 21 mg/dL (ref 8–23)
CO2: 25 mmol/L (ref 22–32)
Calcium: 8.6 mg/dL — ABNORMAL LOW (ref 8.9–10.3)
Chloride: 102 mmol/L (ref 98–111)
Creatinine, Ser: 1.31 mg/dL — ABNORMAL HIGH (ref 0.44–1.00)
GFR, Estimated: 43 mL/min — ABNORMAL LOW (ref >60)
Glucose, Bld: 111 mg/dL — ABNORMAL HIGH (ref 70–99)
Potassium: 4.2 mmol/L (ref 3.5–5.1)
Sodium: 138 mmol/L (ref 135–145)

## 2024-04-05 LAB — CBC
HCT: 29.6 % — ABNORMAL LOW (ref 36.0–46.0)
Hemoglobin: 9.7 g/dL — ABNORMAL LOW (ref 12.0–15.0)
MCH: 31.6 pg (ref 26.0–34.0)
MCHC: 32.8 g/dL (ref 30.0–36.0)
MCV: 96.4 fL (ref 80.0–100.0)
Platelets: 177 K/uL (ref 150–400)
RBC: 3.07 MIL/uL — ABNORMAL LOW (ref 3.87–5.11)
RDW: 14.8 % (ref 11.5–15.5)
WBC: 8.3 K/uL (ref 4.0–10.5)
nRBC: 0 % (ref 0.0–0.2)

## 2024-04-05 MED ORDER — DICYCLOMINE HCL 10 MG PO CAPS
10.0000 mg | ORAL_CAPSULE | Freq: Three times a day (TID) | ORAL | Status: DC | PRN
  Administered 2024-04-05 – 2024-04-08 (×4): 10 mg via ORAL
  Filled 2024-04-05 (×4): qty 1

## 2024-04-05 NOTE — Progress Notes (Signed)
 Subjective: She had three voluminous diarrheal bowel movements this AM.  Objective: Vital signs in last 24 hours: Temp:  [97.7 F (36.5 C)-98.6 F (37 C)] 98.3 F (36.8 C) (07/07 0400) Pulse Rate:  [76-91] 76 (07/07 0400) Resp:  [16-20] 20 (07/07 0400) BP: (101-153)/(71-123) 142/85 (07/07 0400) SpO2:  [86 %-95 %] 95 % (07/07 0400) Last BM Date : 04/04/24  Intake/Output from previous day: 07/06 0701 - 07/07 0700 In: 240 [P.O.:240] Out: -  Intake/Output this shift: Total I/O In: 240 [P.O.:240] Out: -   General appearance: alert and no distress GI: soft, non-tender; bowel sounds normal; no masses,  no organomegaly  Lab Results: Recent Labs    04/03/24 0927 04/04/24 0339 04/05/24 0342  WBC 7.2 9.2 8.3  HGB 10.5* 9.7* 9.7*  HCT 31.9* 29.3* 29.6*  PLT 175 178 177   BMET Recent Labs    04/03/24 0927 04/04/24 0339 04/05/24 0342  NA 136 136 138  K 5.1 4.5 4.2  CL 99 100 102  CO2 23 24 25   GLUCOSE 133* 118* 111*  BUN 48* 33* 21  CREATININE 2.14* 1.63* 1.31*  CALCIUM  8.2* 8.3* 8.6*   LFT Recent Labs    04/03/24 0927  PROT 5.9*  ALBUMIN  3.2*  AST 13*  ALT <5  ALKPHOS 84  BILITOT 1.0   PT/INR No results for input(s): LABPROT, INR in the last 72 hours. Hepatitis Panel No results for input(s): HEPBSAG, HCVAB, HEPAIGM, HEPBIGM in the last 72 hours. C-Diff No results for input(s): CDIFFTOX in the last 72 hours. Fecal Lactopherrin No results for input(s): FECLLACTOFRN in the last 72 hours.  Studies/Results: DG HIP PORT UNILAT WITH PELVIS 1V LEFT Result Date: 04/03/2024 CLINICAL DATA:  Fall 1 week ago, hip pain EXAM: DG HIP (WITH OR WITHOUT PELVIS) 1V PORT LEFT COMPARISON:  05/31/2022 FINDINGS: Limited single AP view of the left hip. No visible fracture. No subluxation or dislocation. Soft tissues are intact. IMPRESSION: Limited study.  No visible fracture. Electronically Signed   By: Franky Crease M.D.   On: 04/03/2024 16:10   CT ANGIO GI  BLEED Result Date: 04/03/2024 CLINICAL DATA:  Diarrhea for 2 days, history of colitis, concern for mesenteric ischemia EXAM: CTA ABDOMEN AND PELVIS WITHOUT AND WITH CONTRAST TECHNIQUE: Multidetector CT imaging of the abdomen and pelvis was performed using the standard protocol during bolus administration of intravenous contrast. Multiplanar reconstructed images and MIPs were obtained and reviewed to evaluate the vascular anatomy. RADIATION DOSE REDUCTION: This exam was performed according to the departmental dose-optimization program which includes automated exposure control, adjustment of the mA and/or kV according to patient size and/or use of iterative reconstruction technique. CONTRAST:  80mL OMNIPAQUE  IOHEXOL  350 MG/ML SOLN COMPARISON:  None Available. FINDINGS: VASCULAR Normal contour and caliber of the abdominal aorta. No evidence of aortic aneurysm, dissection, or other acute aortic pathology. Severe mixed aortic atherosclerosis. Standard branching pattern of the abdominal aorta with solitary bilateral renal arteries. 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 Remaining branch vessel origins are patent. Review of the MIP images confirms the above findings. NON-VASCULAR Lower Chest: No acute findings.  Small hiatal hernia. Hepatobiliary: No focal liver abnormality is seen. Mildly coarse contour of the liver. Hepatic steatosis. Status post cholecystectomy. No biliary dilatation. Pancreas: Unremarkable.  No pancreatic ductal dilatation or surrounding inflammatory changes. Spleen: Normal in size without significant abnormality. Adrenals/Urinary Tract: Adrenal glands are unremarkable. Kidneys are normal, without renal calculi, solid lesion, or  hydronephrosis. Bladder is unremarkable. Stomach/Bowel: Stomach is within normal limits. The colon is partially fluid-filled, with long segment wall thickening involving the sigmoid colon and rectum (series 6, image 173, 189). Radiodense ingested debris in the cecal base, which is present on precontrast images and does not reflect arterial contrast extravasation (series 4, image 66). Lymphatic: No enlarged abdominal or pelvic lymph nodes. Reproductive: No mass or other significant abnormality. Other: Fat containing umbilical hernia.  No ascites. Musculoskeletal: No acute osseous findings. IMPRESSION: 1. The superior mesenteric and inferior mesenteric arteries are patent and opacified through their proximal order branches. 2. 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 3. The colon is partially fluid-filled, with long segment wall thickening involving the sigmoid colon and rectum, consistent with nonspecific infectious, inflammatory, or ischemic colitis. 4. Hepatic steatosis and mildly coarse contour of the liver, suggestive of cirrhosis. Aortic Atherosclerosis (ICD10-I70.0). Electronically Signed   By: Marolyn JONETTA Jaksch M.D.   On: 04/03/2024 13:12    Medications: Scheduled:  carbidopa -levodopa   1 tablet Oral TID   carvedilol   12.5 mg Oral BID   pregabalin   75 mg Oral BID   rosuvastatin   20 mg Oral Daily   Continuous:  Assessment/Plan: 1) Abnormal CT scan. 2) Diarrhea.   Her alst colonoscopy was 07/2021 and it was a normal examination.  Interestingly her colonoscopy on 08/2019 did show ischemic changes, but it was in the proximal colon.  Plan: 1) FFS with biopsies tomorrow.  LOS: 0 days   Amye Grego D 04/05/2024, 12:27 PM

## 2024-04-05 NOTE — Care Management Obs Status (Signed)
 MEDICARE OBSERVATION STATUS NOTIFICATION   Patient Details  Name: Kristin Gilmore MRN: [id number] Date of Birth: [birth year]   Medicare Observation Status Notification Given:  No    Tawni [name], LCSW 04/05/2024, 9:57 AM

## 2024-04-05 NOTE — Plan of Care (Signed)

## 2024-04-05 NOTE — Progress Notes (Addendum)
 PROGRESS NOTE    Kristin Gilmore  FMW:[id number] DOB: 05-May-1951 DOA: 04/03/2024 PCP: [name], DO   Brief Narrative: Kristin Gilmore is a 73 y.o. female with a history of Parkinson disease, hypertension, hyperlipidemia, neuropathy.  Patient presented secondary to diarrhea, weakness and lightheadedness and found to have evidence of colitis, AKI and heme positive stool concerning for GI bleeding. IV fluids started. GI consulted.   Assessment and Plan:  Colitis Associated diarrhea. Concern this could be iatrogenic secondary to patient's attempt to manage constipation. CT imaging significant for partially fluid-filled colon with long segment wall thickening involving the sigmoid colon and rectum. Gi consulted and recommendations are pending. Patient with continued diarrhea. -Follow-up GI recommendations -Abdominal x-ray  Heme positive stool Noted on admission. Patient reports no recent history of melena or hematochezia. GI consulted with recommendation for conservative management for now. -Follow-up GI recommendations  Acute anemia Presumed secondary to GI bleeding. Baseline hemoglobin of 12.. Hemoglobin drift downward to 9.7 today. - CBC daily  AKI on CKD stage IIIa Baseline creatinine of about 1.2-1.5. Creatinine of 2.13 on admission. Improved to 1.31 after IV fluids.  Lactic acidosis Lactic acid of 2.5 on admission with peak of 3.2. Secondary to dehydration. Improved to 1.3 after IV fluids. Resolved.  Possible cirrhosis Noted on CT imaging. Evidence of hepatic steatosis as well.  Primary hypertension Patient is on amlodipine  and Coreg  as an outpatient which were held. - Continue Coreg   Hyperlipidemia Aortic atherosclerosis - Continue Crestor   Parkinson disease - Continue Sinemet   Mononeuropathy - Continue Lyrica    DVT prophylaxis: SCDs Code Status:   Code Status: Full Code Family Communication: None at bedside Disposition Plan: Discharge home likely  in 1-2 days pending GI recommendations   Consultants:  Gastroenterology  Procedures:  None  Antimicrobials: None    Subjective: Ongoing diarrhea with some lower abdominal pain  Objective: BP (!) 142/85 (BP Location: Right Arm)   Pulse 76   Temp 98.3 F (36.8 C) (Oral)   Resp 20   Ht 5' 7 (1.702 m)   Wt 88.5 kg   SpO2 95%   BMI 30.56 kg/m   Examination:  General exam: Appears calm and comfortable Respiratory system: Clear to auscultation. Respiratory effort normal. Cardiovascular system: S1 & S2 heard, RRR. No murmurs, rubs, gallops or clicks. Gastrointestinal system: Abdomen is distended, soft and mildly tender. Normal bowel sounds heard. Central nervous system: Alert and oriented. No focal neurological deficits. Musculoskeletal: No edema. No calf tenderness Psychiatry: Judgement and insight appear normal. Mood & affect appropriate.    Data Reviewed: I have personally reviewed following labs and imaging studies  CBC Lab Results  Component Value Date   WBC 8.3 04/05/2024   RBC 3.07 (L) 04/05/2024   HGB 9.7 (L) 04/05/2024   HCT 29.6 (L) 04/05/2024   MCV 96.4 04/05/2024   MCH 31.6 04/05/2024   PLT 177 04/05/2024   MCHC 32.8 04/05/2024   RDW 14.8 04/05/2024   LYMPHSABS 0.7 04/03/2024   MONOABS 0.7 04/03/2024   EOSABS 0.1 04/03/2024   BASOSABS 0.0 04/03/2024     Last metabolic panel Lab Results  Component Value Date   NA 138 04/05/2024   K 4.2 04/05/2024   CL 102 04/05/2024   CO2 25 04/05/2024   BUN 21 04/05/2024   CREATININE 1.31 (H) 04/05/2024   GLUCOSE 111 (H) 04/05/2024   GFRNONAA 43 (L) 04/05/2024   GFRAA >60 12/20/2017   CALCIUM  8.6 (L) 04/05/2024   PHOS 4.1 06/08/2022  PROT 5.9 (L) 04/03/2024   ALBUMIN  3.2 (L) 04/03/2024   LABGLOB 2.0 11/01/2015   AGRATIO 2.3 11/01/2015   BILITOT 1.0 04/03/2024   ALKPHOS 84 04/03/2024   AST 13 (L) 04/03/2024   ALT <5 04/03/2024   ANIONGAP 11 04/05/2024    GFR: Estimated Creatinine Clearance:  43.7 mL/min (A) (by C-G formula based on SCr of 1.31 mg/dL (H)).  No results found for this or any previous visit (from the past 240 hours).    Radiology Studies: DG HIP PORT UNILAT WITH PELVIS 1V LEFT Result Date: 04/03/2024 CLINICAL DATA:  Fall 1 week ago, hip pain EXAM: DG HIP (WITH OR WITHOUT PELVIS) 1V PORT LEFT COMPARISON:  05/31/2022 FINDINGS: Limited single AP view of the left hip. No visible fracture. No subluxation or dislocation. Soft tissues are intact. IMPRESSION: Limited study.  No visible fracture. Electronically Signed   By: Franky Crease M.D.   On: 04/03/2024 16:10   CT ANGIO GI BLEED Result Date: 04/03/2024 CLINICAL DATA:  Diarrhea for 2 days, history of colitis, concern for mesenteric ischemia EXAM: CTA ABDOMEN AND PELVIS WITHOUT AND WITH CONTRAST TECHNIQUE: Multidetector CT imaging of the abdomen and pelvis was performed using the standard protocol during bolus administration of intravenous contrast. Multiplanar reconstructed images and MIPs were obtained and reviewed to evaluate the vascular anatomy. RADIATION DOSE REDUCTION: This exam was performed according to the departmental dose-optimization program which includes automated exposure control, adjustment of the mA and/or kV according to patient size and/or use of iterative reconstruction technique. CONTRAST:  80mL OMNIPAQUE  IOHEXOL  350 MG/ML SOLN COMPARISON:  None Available. FINDINGS: VASCULAR Normal contour and caliber of the abdominal aorta. No evidence of aortic aneurysm, dissection, or other acute aortic pathology. Severe mixed aortic atherosclerosis. Standard branching pattern of the abdominal aorta with solitary bilateral renal arteries. 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 Remaining branch vessel origins are patent. Review of the MIP images confirms the above findings. NON-VASCULAR Lower Chest: No acute findings.  Small hiatal hernia. Hepatobiliary: No focal liver abnormality is seen. Mildly coarse contour of the liver. Hepatic steatosis. Status post cholecystectomy. No biliary dilatation. Pancreas: Unremarkable. No pancreatic ductal dilatation or surrounding inflammatory changes. Spleen: Normal in size without significant abnormality. Adrenals/Urinary Tract: Adrenal glands are unremarkable. Kidneys are normal, without renal calculi, solid lesion, or hydronephrosis. Bladder is unremarkable. Stomach/Bowel: Stomach is within normal limits. The colon is partially fluid-filled, with long segment wall thickening involving the sigmoid colon and rectum (series 6, image 173, 189). Radiodense ingested debris in the cecal base, which is present on precontrast images and does not reflect arterial contrast extravasation (series 4, image 66). Lymphatic: No enlarged abdominal or pelvic lymph nodes. Reproductive: No mass or other significant abnormality. Other: Fat containing umbilical hernia.  No ascites. Musculoskeletal: No acute osseous findings. IMPRESSION: 1. The superior mesenteric and inferior mesenteric arteries are patent and opacified through their proximal order branches. 2. 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 3. The colon is partially fluid-filled, with long segment wall thickening involving the sigmoid colon and rectum, consistent with nonspecific infectious, inflammatory,  or ischemic colitis. 4. Hepatic steatosis and mildly coarse contour of the liver, suggestive of cirrhosis. Aortic Atherosclerosis (ICD10-I70.0). Electronically Signed   By: Marolyn JONETTA Jaksch M.D.   On: 04/03/2024 13:12      LOS: 0 days    Elgin Lam, MD [facility] 04/05/2024, 11:51 AM   If 7PM-7AM, please contact night-coverage [email or web address]

## 2024-04-05 NOTE — Progress Notes (Signed)
 Pt had two episodes of diarrhea this morning and complained of abdominal pain 7/10.  Gave tylenol .  Updated MD.

## 2024-04-05 NOTE — H&P (View-Only) (Signed)
 Subjective: She had three voluminous diarrheal bowel movements this AM.  Objective: Vital signs in last 24 hours: Temp:  [97.7 F (36.5 C)-98.6 F (37 C)] 98.3 F (36.8 C) (07/07 0400) Pulse Rate:  [76-91] 76 (07/07 0400) Resp:  [16-20] 20 (07/07 0400) BP: (101-153)/(71-123) 142/85 (07/07 0400) SpO2:  [86 %-95 %] 95 % (07/07 0400) Last BM Date : 04/04/24  Intake/Output from previous day: 07/06 0701 - 07/07 0700 In: 240 [P.O.:240] Out: -  Intake/Output this shift: Total I/O In: 240 [P.O.:240] Out: -   General appearance: alert and no distress GI: soft, non-tender; bowel sounds normal; no masses,  no organomegaly  Lab Results: Recent Labs    04/03/24 0927 04/04/24 0339 04/05/24 0342  WBC 7.2 9.2 8.3  HGB 10.5* 9.7* 9.7*  HCT 31.9* 29.3* 29.6*  PLT 175 178 177   BMET Recent Labs    04/03/24 0927 04/04/24 0339 04/05/24 0342  NA 136 136 138  K 5.1 4.5 4.2  CL 99 100 102  CO2 23 24 25   GLUCOSE 133* 118* 111*  BUN 48* 33* 21  CREATININE 2.14* 1.63* 1.31*  CALCIUM  8.2* 8.3* 8.6*   LFT Recent Labs    04/03/24 0927  PROT 5.9*  ALBUMIN  3.2*  AST 13*  ALT <5  ALKPHOS 84  BILITOT 1.0   PT/INR No results for input(s): LABPROT, INR in the last 72 hours. Hepatitis Panel No results for input(s): HEPBSAG, HCVAB, HEPAIGM, HEPBIGM in the last 72 hours. C-Diff No results for input(s): CDIFFTOX in the last 72 hours. Fecal Lactopherrin No results for input(s): FECLLACTOFRN in the last 72 hours.  Studies/Results: DG HIP PORT UNILAT WITH PELVIS 1V LEFT Result Date: 04/03/2024 CLINICAL DATA:  Fall 1 week ago, hip pain EXAM: DG HIP (WITH OR WITHOUT PELVIS) 1V PORT LEFT COMPARISON:  05/31/2022 FINDINGS: Limited single AP view of the left hip. No visible fracture. No subluxation or dislocation. Soft tissues are intact. IMPRESSION: Limited study.  No visible fracture. Electronically Signed   By: Franky Crease M.D.   On: 04/03/2024 16:10   CT ANGIO GI  BLEED Result Date: 04/03/2024 CLINICAL DATA:  Diarrhea for 2 days, history of colitis, concern for mesenteric ischemia EXAM: CTA ABDOMEN AND PELVIS WITHOUT AND WITH CONTRAST TECHNIQUE: Multidetector CT imaging of the abdomen and pelvis was performed using the standard protocol during bolus administration of intravenous contrast. Multiplanar reconstructed images and MIPs were obtained and reviewed to evaluate the vascular anatomy. RADIATION DOSE REDUCTION: This exam was performed according to the departmental dose-optimization program which includes automated exposure control, adjustment of the mA and/or kV according to patient size and/or use of iterative reconstruction technique. CONTRAST:  80mL OMNIPAQUE  IOHEXOL  350 MG/ML SOLN COMPARISON:  None Available. FINDINGS: VASCULAR Normal contour and caliber of the abdominal aorta. No evidence of aortic aneurysm, dissection, or other acute aortic pathology. Severe mixed aortic atherosclerosis. Standard branching pattern of the abdominal aorta with solitary bilateral renal arteries. 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 Remaining branch vessel origins are patent. Review of the MIP images confirms the above findings. NON-VASCULAR Lower Chest: No acute findings.  Small hiatal hernia. Hepatobiliary: No focal liver abnormality is seen. Mildly coarse contour of the liver. Hepatic steatosis. Status post cholecystectomy. No biliary dilatation. Pancreas: Unremarkable.  No pancreatic ductal dilatation or surrounding inflammatory changes. Spleen: Normal in size without significant abnormality. Adrenals/Urinary Tract: Adrenal glands are unremarkable. Kidneys are normal, without renal calculi, solid lesion, or  hydronephrosis. Bladder is unremarkable. Stomach/Bowel: Stomach is within normal limits. The colon is partially fluid-filled, with long segment wall thickening involving the sigmoid colon and rectum (series 6, image 173, 189). Radiodense ingested debris in the cecal base, which is present on precontrast images and does not reflect arterial contrast extravasation (series 4, image 66). Lymphatic: No enlarged abdominal or pelvic lymph nodes. Reproductive: No mass or other significant abnormality. Other: Fat containing umbilical hernia.  No ascites. Musculoskeletal: No acute osseous findings. IMPRESSION: 1. The superior mesenteric and inferior mesenteric arteries are patent and opacified through their proximal order branches. 2. 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 3. The colon is partially fluid-filled, with long segment wall thickening involving the sigmoid colon and rectum, consistent with nonspecific infectious, inflammatory, or ischemic colitis. 4. Hepatic steatosis and mildly coarse contour of the liver, suggestive of cirrhosis. Aortic Atherosclerosis (ICD10-I70.0). Electronically Signed   By: Marolyn JONETTA Jaksch M.D.   On: 04/03/2024 13:12    Medications: Scheduled:  carbidopa -levodopa   1 tablet Oral TID   carvedilol   12.5 mg Oral BID   pregabalin   75 mg Oral BID   rosuvastatin   20 mg Oral Daily   Continuous:  Assessment/Plan: 1) Abnormal CT scan. 2) Diarrhea.   Her alst colonoscopy was 07/2021 and it was a normal examination.  Interestingly her colonoscopy on 08/2019 did show ischemic changes, but it was in the proximal colon.  Plan: 1) FFS with biopsies tomorrow.  LOS: 0 days   Kristin Gilmore D 04/05/2024, 12:27 PM

## 2024-04-05 NOTE — Progress Notes (Signed)
   04/05/24 1525  Spiritual Encounters  Type of Visit Attempt (pt unavailable)  Referral source Clinical staff   Per spiritual care request I attempted to visit with Mrs. Sophina Houdeshell to discuss HCPOA.  At this time, Mrs. Strubel requested that I return later on.  Jahsiah Carpenter L. Fredrica, M.Div [phone]

## 2024-04-06 ENCOUNTER — Observation Stay (HOSPITAL_COMMUNITY): Admitting: Anesthesiology

## 2024-04-06 ENCOUNTER — Encounter (HOSPITAL_COMMUNITY): Payer: Self-pay | Admitting: Internal Medicine

## 2024-04-06 ENCOUNTER — Encounter (HOSPITAL_COMMUNITY): Admission: EM | Disposition: A | Discharge status: Home Health | Payer: Self-pay | Source: Home / Self Care

## 2024-04-06 DIAGNOSIS — K633 Ulcer of intestine: Secondary | ICD-10-CM | POA: Diagnosis not present

## 2024-04-06 DIAGNOSIS — F418 Other specified anxiety disorders: Secondary | ICD-10-CM | POA: Diagnosis not present

## 2024-04-06 DIAGNOSIS — R197 Diarrhea, unspecified: Secondary | ICD-10-CM | POA: Diagnosis not present

## 2024-04-06 DIAGNOSIS — I10 Essential (primary) hypertension: Secondary | ICD-10-CM | POA: Diagnosis not present

## 2024-04-06 DIAGNOSIS — N179 Acute kidney failure, unspecified: Secondary | ICD-10-CM | POA: Diagnosis not present

## 2024-04-06 DIAGNOSIS — R933 Abnormal findings on diagnostic imaging of other parts of digestive tract: Secondary | ICD-10-CM | POA: Diagnosis not present

## 2024-04-06 DIAGNOSIS — K922 Gastrointestinal hemorrhage, unspecified: Secondary | ICD-10-CM | POA: Diagnosis not present

## 2024-04-06 HISTORY — PX: FLEXIBLE SIGMOIDOSCOPY: SHX5431

## 2024-04-06 LAB — BASIC METABOLIC PANEL WITH GFR
Anion gap: 11 (ref 5–15)
BUN: 14 mg/dL (ref 8–23)
CO2: 23 mmol/L (ref 22–32)
Calcium: 8.8 mg/dL — ABNORMAL LOW (ref 8.9–10.3)
Chloride: 106 mmol/L (ref 98–111)
Creatinine, Ser: 1.28 mg/dL — ABNORMAL HIGH (ref 0.44–1.00)
GFR, Estimated: 44 mL/min — ABNORMAL LOW (ref >60)
Glucose, Bld: 117 mg/dL — ABNORMAL HIGH (ref 70–99)
Potassium: 3.9 mmol/L (ref 3.5–5.1)
Sodium: 140 mmol/L (ref 135–145)

## 2024-04-06 LAB — CBC
HCT: 30.6 % — ABNORMAL LOW (ref 36.0–46.0)
Hemoglobin: 10 g/dL — ABNORMAL LOW (ref 12.0–15.0)
MCH: 31.3 pg (ref 26.0–34.0)
MCHC: 32.7 g/dL (ref 30.0–36.0)
MCV: 95.6 fL (ref 80.0–100.0)
Platelets: 189 K/uL (ref 150–400)
RBC: 3.2 MIL/uL — ABNORMAL LOW (ref 3.87–5.11)
RDW: 14.5 % (ref 11.5–15.5)
WBC: 5.1 K/uL (ref 4.0–10.5)
nRBC: 0 % (ref 0.0–0.2)

## 2024-04-06 LAB — GLUCOSE, CAPILLARY: Glucose-Capillary: 120 mg/dL — ABNORMAL HIGH (ref 70–99)

## 2024-04-06 SURGERY — SIGMOIDOSCOPY, FLEXIBLE
Anesthesia: Monitor Anesthesia Care

## 2024-04-06 MED ORDER — PROPOFOL 10 MG/ML IV BOLUS
INTRAVENOUS | Status: DC | PRN
Start: 2024-04-06 — End: 2024-04-06
  Administered 2024-04-06: 50 mg via INTRAVENOUS
  Administered 2024-04-06: 125 ug/kg/min via INTRAVENOUS

## 2024-04-06 MED ORDER — LIDOCAINE 5 % EX PTCH
1.0000 | MEDICATED_PATCH | Freq: Every day | CUTANEOUS | Status: DC
  Administered 2024-04-07: 1 via TRANSDERMAL
  Filled 2024-04-06 (×2): qty 1

## 2024-04-06 MED ORDER — SODIUM CHLORIDE 0.9 % IV SOLN: INTRAVENOUS | Status: DC

## 2024-04-06 MED ORDER — PROPOFOL 1000 MG/100ML IV EMUL
INTRAVENOUS | Status: AC
  Filled 2024-04-06: qty 100

## 2024-04-06 MED ORDER — SODIUM CHLORIDE 0.9 % IV SOLN: INTRAVENOUS | Status: DC | PRN

## 2024-04-06 MED ORDER — LIDOCAINE 2% (20 MG/ML) 5 ML SYRINGE
INTRAMUSCULAR | Status: DC | PRN
Start: 2024-04-06 — End: 2024-04-06
  Administered 2024-04-06: 100 mg via INTRAVENOUS

## 2024-04-06 NOTE — Op Note (Signed)
 [facility] Patient Name: Kristin Gilmore Procedure Date: 04/06/2024 MRN: [id number] Attending MD: Belvie Just , MD, [id number] Date of Birth: [birth year] CSN: [id number] Age: 73 Admit Type: Inpatient Procedure:                Flexible Sigmoidoscopy Indications:              Diarrhea, Abnormal CT of the GI tract Providers:                Belvie Just, MD, Darleene Bare, RN, Coye Bade, Technician Referring MD:              Medicines:                Propofol  per Anesthesia Complications:            No immediate complications. Estimated Blood Loss:     Estimated blood loss: none. Procedure:                Pre-Anesthesia Assessment:                           - Prior to the procedure, a History and Physical                            was performed, and patient medications and                            allergies were reviewed. The patient's tolerance of                            previous anesthesia was also reviewed. The risks                            and benefits of the procedure and the sedation                            options and risks were discussed with the patient.                            All questions were answered, and informed consent                            was obtained. Prior Anticoagulants: The patient has                            taken no anticoagulant or antiplatelet agents. ASA                            Grade Assessment: III - A patient with severe                            systemic disease. After reviewing the risks and  benefits, the patient was deemed in satisfactory                            condition to undergo the procedure.                           - Sedation was administered by an anesthesia                            professional. Deep sedation was attained.                           After obtaining informed consent, the scope was                            passed under  direct vision. The PCF-HQ190L                            ([id number]) Olympus colonoscope was introduced                            through the anus and advanced to the the descending                            colon. The flexible sigmoidoscopy was accomplished                            without difficulty. The patient tolerated the                            procedure well. The quality of the bowel                            preparation was good. Scope In: 1:53:04 PM Scope Out: 1:58:56 PM Total Procedure Duration: 0 hours 5 minutes 52 seconds  Findings:      A few three mm ulcers were found in the sigmoid colon. No bleeding was       present. No stigmata of recent bleeding were seen. Biopsies were taken       with a cold forceps for histology.      A couple of shallow ulcers were found in the proximal sigmoid colon.       There was some mild surrounding inflammation. Cold biopsies were       obtained. Impression:               - A few ulcers in the sigmoid colon. Biopsied. Moderate Sedation:      Not Applicable - Patient had care per Anesthesia. Recommendation:           - Return patient to hospital ward for ongoing care.                           - Await pathology results.                           - Resume regular diet.                           -  Okay to D/C home.                           - Follow up with Dr. Kristie in 2-4 weeks. Procedure Code(s):        --- Professional ---                           [phone], Sigmoidoscopy, flexible; with biopsy, single                            or multiple Diagnosis Code(s):        --- Professional ---                           K63.3, Ulcer of intestine                           R19.7, Diarrhea, unspecified                           R93.3, Abnormal findings on diagnostic imaging of                            other parts of digestive tract CPT copyright 2022 [employer]. All rights reserved. The codes documented in this report are  preliminary and upon coder review may  be revised to meet current compliance requirements. Belvie Just, MD Belvie Just, MD 04/06/2024 2:06:02 PM This report has been signed electronically. Number of Addenda: 0

## 2024-04-06 NOTE — Anesthesia Procedure Notes (Signed)
 Procedure Name: MAC Date/Time: 04/06/2024 1:38 PM  Performed by: Obadiah Reyes BROCKS, CRNAPre-anesthesia Checklist: Patient identified, Emergency Drugs available, Suction available, Patient being monitored and Timeout performed Patient Re-evaluated:Patient Re-evaluated prior to induction Oxygen Delivery Method: Simple face mask Preoxygenation: Pre-oxygenation with 100% oxygen Induction Type: IV induction

## 2024-04-06 NOTE — Progress Notes (Signed)
 IVT consult placed for PIV prior to endoscopy procedure this afternoon. Patient requesting to start PIV later after she has completed her enemas to avoid any issues with placement. Primary RN notified of request. Will follow up if RN unable to obtain access.  Harlowe Dowler R Hayze Gazda, RN

## 2024-04-06 NOTE — Anesthesia Postprocedure Evaluation (Signed)
 Anesthesia Post Note  Patient: Kristin Gilmore  Procedure(s) Performed: SIGMOIDOSCOPY, FLEXIBLE     Patient location during evaluation: PACU Anesthesia Type: MAC Level of consciousness: awake and alert Pain management: pain level controlled Vital Signs Assessment: post-procedure vital signs reviewed and stable Respiratory status: spontaneous breathing, nonlabored ventilation and respiratory function stable Cardiovascular status: blood pressure returned to baseline Postop Assessment: no apparent nausea or vomiting Anesthetic complications: no   No notable events documented.  Last Vitals:  Vitals:   04/06/24 1413 04/06/24 1420  BP: 113/72 (!) 128/41  Pulse: 66 64  Resp: 20 14  Temp:    SpO2: 94% 96%    Last Pain:  Vitals:   04/06/24 1420  TempSrc:   PainSc: 0-No pain                 Vertell Row

## 2024-04-06 NOTE — Progress Notes (Signed)
 PROGRESS NOTE    Kristin Gilmore  FMW:[id number] DOB: 1951/02/21 DOA: 04/03/2024 PCP: [name], DO   Brief Narrative: Delphina Gilmore Kristin is a 73 y.o. female with a history of Parkinson disease, hypertension, hyperlipidemia, neuropathy.  Patient presented secondary to diarrhea, weakness and lightheadedness and found to have evidence of colitis, AKI and heme positive stool concerning for GI bleeding. IV fluids started. GI consulted.   Assessment and Plan:  Colitis Associated diarrhea. Concern this could be iatrogenic secondary to patient's attempt to manage constipation. CT imaging significant for partially fluid-filled colon with long segment wall thickening involving the sigmoid colon and rectum. Gi consulted and recommendations are pending. Patient with continued diarrhea. -Follow-up GI recommendations: plan for flexible sigmoidoscopy today  Heme positive stool Noted on admission. Patient reports no recent history of melena or hematochezia. GI consulted with recommendation for conservative management for now. -Follow-up GI recommendations: flex sig  Acute anemia Presumed secondary to GI bleeding. Baseline hemoglobin of 12.. Hemoglobin drift downward to a low of 9.7 and has stabilized. -CBC daily  AKI on CKD stage IIIa Baseline creatinine of about 1.2-1.5. Creatinine of 2.13 on admission. Improved to 1.28 after IV fluids.  Lactic acidosis Lactic acid of 2.5 on admission with peak of 3.2. Secondary to dehydration. Improved to 1.3 after IV fluids. Resolved.  Possible cirrhosis Noted on CT imaging. Evidence of hepatic steatosis as well.  Primary hypertension Patient is on amlodipine  and Coreg  as an outpatient which were held. - Continue Coreg   Hyperlipidemia Aortic atherosclerosis - Continue Crestor   Parkinson disease - Continue Sinemet   Mononeuropathy - Continue Lyrica    DVT prophylaxis: SCDs Code Status:   Code Status: Full Code Family Communication: None  at bedside Disposition Plan: Discharge home likely in 1-2 days pending GI recommendations   Consultants:  Gastroenterology  Procedures:  None  Antimicrobials: None    Subjective: Patient with diarrhea from bowel prep. Abdominal pain has improved with Bentyl . Left left hip pain from a remote history of fall and injury, although she states this is overall improving.  Objective: BP (!) 144/74   Pulse 76   Temp 98.4 F (36.9 C) (Temporal)   Resp 14   Ht 5' 7 (1.702 m)   Wt 88.5 kg   SpO2 97%   BMI 30.56 kg/m   Examination:  General exam: Appears calm and comfortable Respiratory system: Clear to auscultation. Respiratory effort normal. Cardiovascular system: S1 & S2 heard, RRR. No murmurs. Gastrointestinal system: Abdomen is nondistended, soft and nontender. Normal bowel sounds heard. Central nervous system: Alert and oriented. Tremor. Psychiatry: Judgement and insight appear normal. Mood & affect appropriate.    Data Reviewed: I have personally reviewed following labs and imaging studies  CBC Lab Results  Component Value Date   WBC 5.1 04/06/2024   RBC 3.20 (L) 04/06/2024   HGB 10.0 (L) 04/06/2024   HCT 30.6 (L) 04/06/2024   MCV 95.6 04/06/2024   MCH 31.3 04/06/2024   PLT 189 04/06/2024   MCHC 32.7 04/06/2024   RDW 14.5 04/06/2024   LYMPHSABS 0.7 04/03/2024   MONOABS 0.7 04/03/2024   EOSABS 0.1 04/03/2024   BASOSABS 0.0 04/03/2024     Last metabolic panel Lab Results  Component Value Date   NA 140 04/06/2024   K 3.9 04/06/2024   CL 106 04/06/2024   CO2 23 04/06/2024   BUN 14 04/06/2024   CREATININE 1.28 (H) 04/06/2024   GLUCOSE 117 (H) 04/06/2024   GFRNONAA 44 (L) 04/06/2024   GFRAA >  60 12/20/2017   CALCIUM  8.8 (L) 04/06/2024   PHOS 4.1 06/08/2022   PROT 5.9 (L) 04/03/2024   ALBUMIN  3.2 (L) 04/03/2024   LABGLOB 2.0 11/01/2015   AGRATIO 2.3 11/01/2015   BILITOT 1.0 04/03/2024   ALKPHOS 84 04/03/2024   AST 13 (L) 04/03/2024   ALT <5  04/03/2024   ANIONGAP 11 04/06/2024    GFR: Estimated Creatinine Clearance: 44.1 mL/min (A) (by C-G formula based on SCr of 1.28 mg/dL (H)).  No results found for this or any previous visit (from the past 240 hours).    Radiology Studies: DG Abd Portable 1V Result Date: 04/05/2024 CLINICAL DATA:  Abdominal pain.  Diarrhea. EXAM: PORTABLE ABDOMEN - 1 VIEW COMPARISON:  CTA abdomen/pelvis dated 04/03/2024. Abdominal radiographs dated 11/01/2021. FINDINGS: Nonobstructive bowel gas pattern. No evidence of free air identified on supine radiographs. Cholecystectomy clips in the right upper quadrant. Posterior lumbar fusion hardware. No acute osseous abnormality. IMPRESSION: Nonobstructive bowel gas pattern. Electronically Signed   By: Harrietta Sherry M.D.   On: 04/05/2024 14:45      LOS: 0 days    Elgin Lam, MD [facility] 04/06/2024, 1:33 PM   If 7PM-7AM, please contact night-coverage [email or web address]

## 2024-04-06 NOTE — Interval H&P Note (Signed)
 History and Physical Interval Note:  04/06/2024 1:35 PM  Kristin Gilmore  has presented today for surgery, with the diagnosis of Diarrhea and abnormal CT scan.  The various methods of treatment have been discussed with the patient and family. After consideration of risks, benefits and other options for treatment, the patient has consented to  Procedure(s): SIGMOIDOSCOPY, FLEXIBLE (N/A) as a surgical intervention.  The patient's history has been reviewed, patient examined, no change in status, stable for surgery.  I have reviewed the patient's chart and labs.  Questions were answered to the patient's satisfaction.     Jaidyn Kuhl D

## 2024-04-06 NOTE — Transfer of Care (Signed)
 Immediate Anesthesia Transfer of Care Note  Patient: Kristin Gilmore  Procedure(s) Performed: [place] SIDE  Patient Location: PACU and Endoscopy Unit  Anesthesia Type:MAC  Level of Consciousness: awake, alert , and oriented  Airway & Oxygen Therapy: Patient Spontanous Breathing and Patient connected to nasal cannula oxygen  Post-op Assessment: Report given to RN and Post -op Vital signs reviewed and stable  Post vital signs: Reviewed and stable  Last Vitals:  Vitals Value Taken Time  BP 113/72 04/06/24 14:13  Temp 36.6 C 04/06/24 14:05  Pulse 61 04/06/24 14:16  Resp 22 04/06/24 14:16  SpO2 94 % 04/06/24 14:16  Vitals shown include unfiled device data.  Last Pain:  Vitals:   04/06/24 1413  TempSrc:   PainSc: 0-No pain      Patients Stated Pain Goal: 1 (04/06/24 1256)  Complications: No notable events documented.

## 2024-04-06 NOTE — Anesthesia Preprocedure Evaluation (Addendum)
 Anesthesia Evaluation  Patient identified by MRN, date of birth, ID band Patient awake    Reviewed: Allergy & Precautions, NPO status , Patient's Chart, lab work & pertinent test results, reviewed documented beta blocker date and time   History of Anesthesia Complications Negative for: history of anesthetic complications  Airway Mallampati: II  TM Distance: >3 FB Neck ROM: Full    Dental no notable dental hx.    Pulmonary neg pulmonary ROS   Pulmonary exam normal        Cardiovascular hypertension, Pt. on medications and Pt. on home beta blockers Normal cardiovascular exam     Neuro/Psych  Headaches  Anxiety Depression Bipolar Disorder   Parkinson's dx    GI/Hepatic negative GI ROS, Neg liver ROS,,,  Endo/Other  diabetes, Type 2    Renal/GU negative Renal ROS  negative genitourinary   Musculoskeletal  (+) Arthritis ,    Abdominal   Peds  Hematology  (+) Blood dyscrasia (Hgb 10.0), anemia   Anesthesia Other Findings Day of surgery medications reviewed with patient.  Reproductive/Obstetrics                              Anesthesia Physical Anesthesia Plan  ASA: 2  Anesthesia Plan: MAC   Post-op Pain Management: Minimal or no pain anticipated   Induction:   PONV Risk Score and Plan: 2 and Treatment may vary due to age or medical condition and Propofol  infusion  Airway Management Planned: Natural Airway and Nasal Cannula  Additional Equipment: None  Intra-op Plan:   Post-operative Plan:   Informed Consent: I have reviewed the patients History and Physical, chart, labs and discussed the procedure including the risks, benefits and alternatives for the proposed anesthesia with the patient or authorized representative who has indicated his/her understanding and acceptance.       Plan Discussed with: CRNA  Anesthesia Plan Comments:          Anesthesia Quick  Evaluation

## 2024-04-07 ENCOUNTER — Encounter (HOSPITAL_COMMUNITY): Payer: Self-pay | Admitting: Gastroenterology

## 2024-04-07 DIAGNOSIS — K922 Gastrointestinal hemorrhage, unspecified: Secondary | ICD-10-CM | POA: Diagnosis not present

## 2024-04-07 LAB — BASIC METABOLIC PANEL WITH GFR
Anion gap: 11 (ref 5–15)
BUN: 16 mg/dL (ref 8–23)
CO2: 25 mmol/L (ref 22–32)
Calcium: 8.8 mg/dL — ABNORMAL LOW (ref 8.9–10.3)
Chloride: 101 mmol/L (ref 98–111)
Creatinine, Ser: 1.3 mg/dL — ABNORMAL HIGH (ref 0.44–1.00)
GFR, Estimated: 43 mL/min — ABNORMAL LOW (ref >60)
Glucose, Bld: 177 mg/dL — ABNORMAL HIGH (ref 70–99)
Potassium: 4.3 mmol/L (ref 3.5–5.1)
Sodium: 137 mmol/L (ref 135–145)

## 2024-04-07 LAB — CBC
HCT: 34.8 % — ABNORMAL LOW (ref 36.0–46.0)
Hemoglobin: 11.3 g/dL — ABNORMAL LOW (ref 12.0–15.0)
MCH: 31.6 pg (ref 26.0–34.0)
MCHC: 32.5 g/dL (ref 30.0–36.0)
MCV: 97.2 fL (ref 80.0–100.0)
Platelets: 239 K/uL (ref 150–400)
RBC: 3.58 MIL/uL — ABNORMAL LOW (ref 3.87–5.11)
RDW: 14.4 % (ref 11.5–15.5)
WBC: 5 K/uL (ref 4.0–10.5)
nRBC: 0 % (ref 0.0–0.2)

## 2024-04-07 LAB — SURGICAL PATHOLOGY

## 2024-04-07 MED ORDER — MELATONIN 5 MG PO TABS
5.0000 mg | ORAL_TABLET | Freq: Every evening | ORAL | Status: DC | PRN
  Administered 2024-04-07: 5 mg via ORAL
  Filled 2024-04-07: qty 1

## 2024-04-07 NOTE — Evaluation (Signed)
 Occupational Therapy Evaluation Patient Details Name: Kristin Gilmore MRN: [id number] DOB: 1951-03-16 Today's Date: 04/07/2024   History of Present Illness    (Ms. Isenberg is a 73 yr old female brought to the hospital with weakness, dizziness, and fatigue. She was found to have colitis, AKI, acute anemia & possible GI bleed. PMH: Parkinson's disease, HTN, chronic constipation, arthritis, R TKA)     Clinical Impressions The pt is currently presenting with the below listed deficits (see OT problem list). As such, her occupational performance is impaired and she requires SBA for tasks, including lower body dressing, sit to stand, and toileting management. During the session today, she reported having 7/10 headache pain and dizziness with progressive activity. She stated the dizziness began a few days ago, and she attributes it to vertigo. OT anticipates she will do very well from a functional stand-point, should her dizziness improve or resolve. OT will continue to follow her for services in the hospital setting, to maximize her independence and safety with self-care tasks. Home health OT is recommended.      If plan is discharge home, recommend the following:   Help with stairs or ramp for entrance;Assist for transportation;Assistance with cooking/housework     Functional Status Assessment   Patient has had a recent decline in their functional status and demonstrates the ability to make significant improvements in function in a reasonable and predictable amount of time.     Equipment Recommendations   None recommended by OT     Recommendations for Other Services         Precautions/Restrictions   Restrictions Weight Bearing Restrictions Per Provider Order: No     Mobility Bed Mobility  General bed mobility comments: pt was received seated in the bedside chair    Transfers Overall transfer level: Needs assistance Equipment used: Rolling walker (2  wheels) Transfers: Sit to/from Stand Sit to Stand: Supervision             Balance     Sitting balance-Leahy Scale: Good       Standing balance-Leahy Scale: Fair           ADL either performed or assessed with clinical judgement   ADL Overall ADL's : Needs assistance/impaired Eating/Feeding: Independent;Sitting   Grooming: Set up;Sitting   Upper Body Bathing: Set up;Sitting Upper Body Bathing Details (indicate cue type and reason): based on clinical judgement Lower Body Bathing: Set up;Sitting/lateral leans Lower Body Bathing Details (indicate cue type and reason): based on clinical judgement Upper Body Dressing : Set up;Sitting   Lower Body Dressing: Set up;Sitting/lateral leans Lower Body Dressing Details (indicate cue type and reason): She doffed then donned her socks seated in the bedside chair, demonstrating the figure 4 technique.         Vision   Additional Comments: She correctly read the time depicted on the wall clock.            Pertinent Vitals/Pain Pain Assessment Pain Assessment: 0-10 Pain Score: 7  Pain Location: headache Pain Intervention(s): Monitored during session, Other (comment) (She reported receiving pain medication this a.m.)     Extremity/Trunk Assessment Upper Extremity Assessment Upper Extremity Assessment: Overall WFL for tasks assessed;Right hand dominant;RUE deficits/detail;LUE deficits/detail RUE Deficits / Details: AROM WFL LUE Deficits / Details: AROM WFL   Lower Extremity Assessment Lower Extremity Assessment: Overall WFL for tasks assessed      Communication Communication Communication: No apparent difficulties   Cognition Arousal: Alert Behavior During Therapy: WFL for tasks assessed/performed  OT - Cognition Comments: Oriented x4                 Following commands: Intact                  Home Living Family/patient expects to be discharged to:: Private residence Living Arrangements:  Alone Available Help at Discharge: Friend(s);Neighbor Type of Home: House Home Access: Stairs to enter [employer] of Steps:  (3) Entrance Stairs-Rails: Right Home Layout: Two level;1/2 bath on main level;Full bath on main level;Able to live on main level with bedroom/bathroom Alternate Level Stairs-Number of Steps: stair lift   Bathroom Shower/Tub: Producer, television/film/video: Standard     Home Equipment: Agricultural consultant (2 wheels);BSC/3in1;Cane - single point;Grab bars - tub/shower;Wheelchair Customer service manager (4 wheels)   Additional Comments: she has a stair lift inside the home      Prior Functioning/Environment Prior Level of Function : Independent/Modified Independent             Mobility Comments:  (She used a rollator and rolling walker for ambulation, but primarily a rollator.) ADLs Comments:  (She was modified independent to independent with ADLs, light cooking, and driving. She has hired [employer] for cleaning. She also has a home health aide 1 day per week who assists with cleaning and picking up groceries.)    OT Problem List: Impaired balance (sitting and/or standing);Decreased coordination;Pain   OT Treatment/Interventions: Self-care/ADL training;Therapeutic activities;Energy conservation;DME and/or AE instruction;Patient/family education;Balance training      OT Goals(Current goals can be found in the care plan section)   Acute Rehab OT Goals Patient Stated Goal: for dizziness to resolve OT Goal Formulation: With patient Time For Goal Achievement: 04/21/24 Potential to Achieve Goals: Good ADL Goals Pt Will Perform Grooming: with modified independence;standing Pt Will Perform Lower Body Dressing: with modified independence;sitting/lateral leans;sit to/from stand Pt Will Transfer to Toilet: with modified independence;ambulating Pt Will Perform Toileting - Clothing Manipulation and hygiene: with modified independence;sit to/from stand    OT Frequency:  Min 1X/week       AM-PAC OT 6 Clicks Daily Activity     Outcome Measure Help from another person eating meals?: None Help from another person taking care of personal grooming?: A Little Help from another person toileting, which includes using toliet, bedpan, or urinal?: A Little Help from another person bathing (including washing, rinsing, drying)?: A Little Help from another person to put on and taking off regular upper body clothing?: None Help from another person to put on and taking off regular lower body clothing?: A Little 6 Click Score: 20   End of Session Equipment Utilized During Treatment: Rolling walker (2 wheels) Nurse Communication: Other (comment) (nurse cleared pt for therapy participation)  Activity Tolerance: Other (comment) (Limited by dizziness) Patient left: in chair;with call bell/phone within reach  OT Visit Diagnosis: Unsteadiness on feet (R26.81);Dizziness and giddiness (R42)                Time: 8974-8953 OT Time Calculation (min): 21 min Charges:  OT General Charges $OT Visit: 1 Visit OT Evaluation $OT Eval Moderate Complexity: 1 Mod    Adna Nofziger L Joely Losier, OTR/L 04/07/2024, 2:01 PM

## 2024-04-07 NOTE — Progress Notes (Signed)
 PROGRESS NOTE  Kristin Gilmore  DOB: [birth year]  PCP: [name], DO FMW:[id number]  DOA: 04/03/2024  LOS: 0 days  Hospital Day: 5  Brief narrative: Kristin Gilmore is a 73 y.o. female with PMH significant for Parkinson's disease, prediabetes, HTN, HLD, neuropathy, pituitary microadenoma, anxiety/depression, arthritis, migraine, chronic constipation. 2 days prior, patient took laxatives followed by nonbloody diarrhea which made him weak, lightheaded and hence presented to the ED on 7/5. Patient denied rectal bleeding or melena Labs showed drop in hemoglobin to 9.7 from a previous normal of 12.3. FOBT was positive  CT abdomen was obtained which showed fluid filled colon with wall thickening involving the sigmoid colon and rectum consistent with nonspecific, inflammatory or ischemic colitis.   Admitted to [facility] Started on IV fluid GI consulted Flex sig 7/8 showed a few ulcers in the sigmoid colon. Biopsied.  Subjective: Patient was seen and examined this afternoon. Pleasant elderly Caucasian female with Parkinson's disease, significant resting tremors, Alert, awake, lying on bed.  Earlier today, she had significant vertigo on trying to get up.  After she took her breakfast, she felt better and was able to sit down on the recliner. Chart reviewed Remains hemodynamically stable Most recent labs from 7/8 showed WBC count of 5.1, hemoglobin 10, BUN/creatinine 14/1.28  Assessment and plan: Colitis FOBT positive Presented with diarrhea after taking laxative for constipation. CT scan finding suggestive of colitis with colon wall thickening GI was consulted Flex sig 7/8 showed a few ulcers in the sigmoid colon. Biopsied. Diarrhea has stopped.  Vertigo Intermittent sudden.  She had 1 prior to presentation.  She had another 1 this morning.  Orthostatic blood pressure measurement advised.  Probably has dysautonomia with Parkinson's disease.  Mild acute anemia Presumed secondary  to GI bleeding. Baseline hemoglobin of 12.. Hemoglobin drift downward to a low of 9.7 and has stabilized. Continue to monitor Recent Labs    04/03/24 0927 04/04/24 0339 04/05/24 0342 04/06/24 0406 04/07/24 0910  HGB 10.5* 9.7* 9.7* 10.0* 11.3*  MCV 97.6 97.0 96.4 95.6 97.2   AKI on CKD stage IIIa Baseline creatinine of about 1.2-1.5. Creatinine of 2.13 on admission. Improved to 1.28 after IV fluids. Recent Labs    06/13/23 1430 06/16/23 1243 07/17/23 1314 01/30/24 1556 04/03/24 0927 04/04/24 0339 04/05/24 0342 04/06/24 0406 04/07/24 0910  BUN 37* 28* 27 34* 48* 33* 21 14 16   CREATININE 1.15* 1.06* 1.26* 1.51* 2.14* 1.63* 1.31* 1.28* 1.30*   Lactic acidosis Lactic acid of 2.5 on admission with peak of 3.2.  Likely secondary to dehydration.  Lactic acid level normalized with IV fluid. Recent Labs  Lab 04/03/24 0927 04/03/24 1048 04/03/24 1109 04/03/24 1313 04/04/24 0339 04/05/24 0342 04/06/24 0406 04/07/24 0910  WBC 7.2  --   --   --  9.2 8.3 5.1 5.0  LATICACIDVEN  --  2.5* 3.2* 1.3  --   --   --   --     Possible cirrhosis Noted on CT imaging. Evidence of hepatic steatosis as well.   Primary hypertension PTA meds- Coreg , amlodipine  Currently continued on Coreg  12.5 mg twice daily   Hyperlipidemia Aortic atherosclerosis Continue Crestor    Parkinson disease Continue Sinemet    Mononeuropathy Continue Lyrica    Mobility: Seen by PT.  Home with PT recommended  Goals of care   Code Status: Full Code     DVT prophylaxis:  Place and maintain sequential compression device Start: 04/07/24 1437   Antimicrobials: None Fluid: None Consultants: None Family Communication: None  at bedside  Status: Observation Level of care:  Progressive   Patient is from: Home Needs to continue in-hospital care: Continue to monitor another 24-hour for hemodynamically stability Anticipated d/c to: If remains stable, plan to discharge home tomorrow      Diet:   Diet Order             Diet regular Fluid consistency: Thin  Diet effective now                   Scheduled Meds:  carbidopa -levodopa   1 tablet Oral TID   carvedilol   12.5 mg Oral BID   lidocaine   1 patch Transdermal Daily   pregabalin   75 mg Oral BID   rosuvastatin   20 mg Oral Daily    PRN meds: acetaminophen  **OR** acetaminophen , albuterol , dicyclomine , ondansetron  **OR** ondansetron  (ZOFRAN ) IV, oxyCODONE , witch hazel-glycerin    Infusions:    Antimicrobials: Anti-infectives (From admission, onward)    None       Objective: Vitals:   04/06/24 2227 04/07/24 1127  BP: (!) 129/55 136/78  Pulse: 70 68  Resp:  14  Temp:  98.1 F (36.7 C)  SpO2:  96%    Intake/Output Summary (Last 24 hours) at 04/07/2024 1437 Last data filed at 04/07/2024 1300 Gross per 24 hour  Intake 240 ml  Output --  Net 240 ml   Filed Weights   04/03/24 1536  Weight: 88.5 kg   Weight change:  Body mass index is 30.56 kg/m.   Physical Exam: General exam: Pleasant, elderly, not in distress Skin: No rashes, lesions or ulcers. HEENT: Atraumatic, normocephalic, no obvious bleeding Lungs: Clear to auscultation bilaterally,  CVS: S1, S2, no murmur,   GI/Abd: Soft, nontender, nondistended, bowel sound present,   CNS: Has resting tremors from Parkinson's disease.  Alert, awake, oriented x 3 Psychiatry: Mood appropriate,  Extremities: No pedal edema, no calf tenderness,   Data Review: I have personally reviewed the laboratory data and studies available.  F/u labs ordered Unresulted Labs (From admission, onward)     Start     Ordered   04/07/24 0500  CBC  Daily,   R     Question:  Specimen collection method  Answer:  Lab=Lab collect   04/06/24 1340   04/03/24 1011  Occult blood card to lab, stool  Once,   URGENT        04/03/24 1010           Signed, Chapman Rota, MD [facility] 04/07/2024

## 2024-04-07 NOTE — Evaluation (Signed)
 Physical Therapy Evaluation Patient Details Name: Kristin Gilmore MRN: [id number] DOB: 09-16-1951 Today's Date: 04/07/2024  History of Present Illness  73 yo female presents to therapy following hospital admission on 04/03/2024 for colitis and GI bleed. Pt underwent sigmoidoscopy with biopsies on 7/8 due to abn CT of GI tract with diarrhea. Pt PMH includes but is not limited to: PD, HTN, depression, anxiety, HLD, lumbar surgeries, R TKA, pituitary microadenoma and migraines.  Clinical Impression   Pt admitted with above diagnosis.  Pt currently with functional limitations due to the deficits listed below (see PT Problem List). Pt seated in recliner and resistive to movement due to reports of generally not feeling well, dizziness and fear of falling. Pt states a sudden onset of vertigo on 7/5 at 6 am. Pt reports things are spinning just sitting. Pt required encouragement to participate with orthostatics, please see below. Pt required CGA and cues for sit to stand  from recliner to RW, maintained static standing with B UE support for 43s and reporting she was going to fall. Pt returned to recliner. Pt later requesting to get in bed, CGA and cues for SPT with RW to bed, S for bed mobility, pt indicated no change in dizziness with rolling side to side, supine, supine <> sit and no apparent nystagmus noted. May require further assessment for vertigo. Pt left in bed, all needs in place, pt reports feeling hot and nursing aware. B UE and LE tremors at baseline with pt reporting having PD for 8 years. Pt will benefit from acute skilled PT to increase their independence and safety with mobility to allow discharge.   Bp seated in recliner 136/78 (67 PR) Bp s/p 43s standing 156/121 (61 PR) Bp findings may be skewed due to UE tremors.      If plan is discharge home, recommend the following:     Can travel by private vehicle        Equipment Recommendations None recommended by PT  Recommendations for Other  Services       Functional Status Assessment Patient has had a recent decline in their functional status and demonstrates the ability to make significant improvements in function in a reasonable and predictable amount of time.     Precautions / Restrictions Precautions Precautions: Fall Restrictions Weight Bearing Restrictions Per Provider Order: No      Mobility  Bed Mobility Overal bed mobility: Needs Assistance Bed Mobility: Sit to Supine, Supine to Sit     Supine to sit: Supervision Sit to supine: Supervision   General bed mobility comments: min cues    Transfers Overall transfer level: Needs assistance Equipment used: Rolling walker (2 wheels) Transfers: Sit to/from Stand, Bed to chair/wheelchair/BSC Sit to Stand: Contact guard assist Stand pivot transfers: Contact guard assist         General transfer comment: min cues for safety and slow and controlled movements pt indicated fear of falling and rushing to transfer recliner to bed    Ambulation/Gait               General Gait Details: NT, pt declined reporting she was not feeling well  Stairs            Wheelchair Mobility     Tilt Bed    Modified Rankin (Stroke Patients Only)       Balance Overall balance assessment: Needs assistance, History of Falls (fall 2 wks ago and pt reports R knee pain and patella fx needing surgery) Sitting-balance support: Feet  supported Sitting balance-Leahy Scale: Good     Standing balance support: Bilateral upper extremity supported, During functional activity, Reliant on assistive device for balance Standing balance-Leahy Scale: Poor Standing balance comment: pt tolerated static standing for 43s with B UE support and CGA                             Pertinent Vitals/Pain Pain Assessment Pain Assessment: Faces Faces Pain Scale: Hurts little more Pain Location: R knee Pain Descriptors / Indicators: Aching, Constant, Discomfort Pain  Intervention(s): Limited activity within patient's tolerance, Monitored during session, Repositioned    Home Living Family/patient expects to be discharged to:: Private residence Living Arrangements: Alone Available Help at Discharge: Friend(s);Neighbor Type of Home: House Home Access: Stairs to enter Entrance Stairs-Rails: Doctor, general practice of Steps: 3 + 1 Alternate Level Stairs-Number of Steps: stair lift Home Layout: Two level;Able to live on main level with bedroom/bathroom Home Equipment: Rolling Walker (2 wheels);Rollator (4 wheels);Cane - single point;BSC/3in1;Grab bars - tub/shower      Prior Function Prior Level of Function : Needs assist             Mobility Comments: pt amb with rollator household distances ADLs Comments: pt reports mod I for all ADLs and self care tasks, pt requires A for IADLs     Extremity/Trunk Assessment        Lower Extremity Assessment Lower Extremity Assessment: Generalized weakness    Cervical / Trunk Assessment Cervical / Trunk Assessment: Normal  Communication   Communication Communication: No apparent difficulties    Cognition Arousal: Alert Behavior During Therapy: WFL for tasks assessed/performed   PT - Cognitive impairments: No apparent impairments                         Following commands: Intact       Cueing       General Comments      Exercises     Assessment/Plan    PT Assessment Patient needs continued PT services  PT Problem List Decreased strength;Decreased activity tolerance;Decreased balance;Decreased mobility;Decreased coordination;Pain       PT Treatment Interventions DME instruction;Gait training;Stair training;Functional mobility training;Therapeutic activities;Balance training;Therapeutic exercise;Neuromuscular re-education;Patient/family education    PT Goals (Current goals can be found in the Care Plan section)  Acute Rehab PT Goals Patient Stated Goal: to get  rid of the dizziness and go home PT Goal Formulation: With patient Time For Goal Achievement: 04/21/24 Potential to Achieve Goals: Good    Frequency Min 3X/week     Co-evaluation               AM-PAC PT 6 Clicks Mobility  Outcome Measure Help needed turning from your back to your side while in a flat bed without using bedrails?: None Help needed moving from lying on your back to sitting on the side of a flat bed without using bedrails?: A Little Help needed moving to and from a bed to a chair (including a wheelchair)?: A Little Help needed standing up from a chair using your arms (e.g., wheelchair or bedside chair)?: A Little Help needed to walk in hospital room?: Total Help needed climbing 3-5 steps with a railing? : Total 6 Click Score: 15    End of Session Equipment Utilized During Treatment: Gait belt Activity Tolerance: Patient limited by pain;Other (comment) (dizziness) Patient left: in bed;with call bell/phone within reach Nurse Communication: Mobility status;Other (comment) (pt  reporting feeling hot) PT Visit Diagnosis: Unsteadiness on feet (R26.81);Other abnormalities of gait and mobility (R26.89);Muscle weakness (generalized) (M62.81);History of falling (Z91.81);Difficulty in walking, not elsewhere classified (R26.2);Pain Pain - Right/Left: Right Pain - part of body: Knee;Leg    Time: 8877-8854 PT Time Calculation (min) (ACUTE ONLY): 23 min   Charges:   PT Evaluation $PT Eval Low Complexity: 1 Low PT Treatments $Therapeutic Activity: 8-22 mins PT General Charges $$ ACUTE PT VISIT: 1 Visit         [place], PT Acute Rehab   [place] VEAR Drone 04/07/2024, 11:50 AM

## 2024-04-08 ENCOUNTER — Other Ambulatory Visit (HOSPITAL_COMMUNITY): Payer: Self-pay

## 2024-04-08 DIAGNOSIS — K922 Gastrointestinal hemorrhage, unspecified: Secondary | ICD-10-CM | POA: Diagnosis not present

## 2024-04-08 LAB — CBC
HCT: 32.8 % — ABNORMAL LOW (ref 36.0–46.0)
Hemoglobin: 10.6 g/dL — ABNORMAL LOW (ref 12.0–15.0)
MCH: 31.4 pg (ref 26.0–34.0)
MCHC: 32.3 g/dL (ref 30.0–36.0)
MCV: 97 fL (ref 80.0–100.0)
Platelets: 223 K/uL (ref 150–400)
RBC: 3.38 MIL/uL — ABNORMAL LOW (ref 3.87–5.11)
RDW: 14.4 % (ref 11.5–15.5)
WBC: 5.9 K/uL (ref 4.0–10.5)
nRBC: 0 % (ref 0.0–0.2)

## 2024-04-08 MED ORDER — CARVEDILOL 6.25 MG PO TABS
6.2500 mg | ORAL_TABLET | Freq: Two times a day (BID) | ORAL | Status: DC
  Administered 2024-04-08: 6.25 mg via ORAL

## 2024-04-08 MED ORDER — CARVEDILOL 6.25 MG PO TABS
6.2500 mg | ORAL_TABLET | Freq: Two times a day (BID) | ORAL | 2 refills | Status: DC
  Filled 2024-04-08: qty 60, 30d supply, fill #0

## 2024-04-08 NOTE — Progress Notes (Signed)
 Physical Therapy Treatment Patient Details Name: Kristin Gilmore MRN: [id number] DOB: 08-04-1951 Today's Date: 04/08/2024   History of Present Illness Ms. Kristin Gilmore is a 73 yr old female brought to the hospital with weakness, dizziness, and fatigue. She was found to have colitis, AKI, acute anemia & possible GI bleed. PMH: Parkinson's disease, HTN, chronic constipation, arthritis, R TKA    PT Comments  Pt agreeable to PT session, anxious about how she will function home alone. Pt reports dizziness with activity (standing and amb, tolerates similar time frame before onset of symptoms), and with supine to sitting. Symptoms resolve with rest. She is able to complete bouts of 66ft at a time with RW before needing to rest. Epley, VOR cx, smooth pursuit, saccades negative for subjective or objective symptoms, not likely cervicogenic. BP attained with manual cuff (see below). Pt has caregivers to assist at home part time. She is able to complete total distance today of 172ft before returning to room for lunch.   BP sitting: 120/64 BP standing: 110/60 BP post activity: 120/80    If plan is discharge home, recommend the following: A little help with walking and/or transfers;A little help with bathing/dressing/bathroom;Assistance with cooking/housework;Assist for transportation;Help with stairs or ramp for entrance   Can travel by private vehicle        Equipment Recommendations  None recommended by PT    Recommendations for Other Services       Precautions / Restrictions Precautions Precautions: Fall Recall of Precautions/Restrictions: Intact Restrictions Weight Bearing Restrictions Per Provider Order: No     Mobility  Bed Mobility Overal bed mobility: Modified Independent Bed Mobility: Sit to Supine, Supine to Sit     Supine to sit: Modified independent (Device/Increase time) Sit to supine: Modified independent (Device/Increase time)     Patient Response:  Anxious  Transfers Overall transfer level: Needs assistance Equipment used: Rolling walker (2 wheels) Transfers: Sit to/from Stand Sit to Stand: Supervision   Step pivot transfers: Supervision       General transfer comment: able to complete with and without walker    Ambulation/Gait Ambulation/Gait assistance: Supervision Gait Distance (Feet): 100 Feet (in increments of 79ft) Assistive device: Rolling walker (2 wheels) Gait Pattern/deviations: Step-through pattern, Narrow base of support, Decreased step length - right, Decreased step length - left Gait velocity: dec     General Gait Details: pt able to amb with chair follow in bouts of 56ft with RW, has step through pattern, good cadence, and waler adjusted to improve UE efficiency with use. dizziness present with this distance and then subsides with rest   Stairs             Wheelchair Mobility     Tilt Bed Tilt Bed Patient Response: Anxious  Modified Rankin (Stroke Patients Only)       Balance Overall balance assessment: Needs assistance, History of Falls Sitting-balance support: Feet supported Sitting balance-Leahy Scale: Good     Standing balance support: No upper extremity supported Standing balance-Leahy Scale: Good Standing balance comment: pt has limited standing tolerance, appears to be consistent with standing tolerance                            Communication Communication Communication: No apparent difficulties  Cognition Arousal: Alert Behavior During Therapy: WFL for tasks assessed/performed   PT - Cognitive impairments: No apparent impairments  Following commands: Intact      Cueing    Exercises      General Comments General comments (skin integrity, edema, etc.): onset of dizziness with standing and amb      Pertinent Vitals/Pain Pain Assessment Pain Assessment: No/denies pain Pain Intervention(s): Monitored during session     Home Living                          Prior Function            PT Goals (current goals can now be found in the care plan section) Acute Rehab PT Goals Patient Stated Goal: to get rid of the dizziness and go home PT Goal Formulation: With patient Time For Goal Achievement: 04/21/24 Potential to Achieve Goals: Good Progress towards PT goals: Progressing toward goals    Frequency    Min 3X/week      PT Plan      Co-evaluation              AM-PAC PT 6 Clicks Mobility   Outcome Measure  Help needed turning from your back to your side while in a flat bed without using bedrails?: None Help needed moving from lying on your back to sitting on the side of a flat bed without using bedrails?: None Help needed moving to and from a bed to a chair (including a wheelchair)?: A Little Help needed standing up from a chair using your arms (e.g., wheelchair or bedside chair)?: None Help needed to walk in hospital room?: A Little Help needed climbing 3-5 steps with a railing? : A Little 6 Click Score: 21    End of Session Equipment Utilized During Treatment: Gait belt Activity Tolerance: Other (comment);Treatment limited secondary to medical complications (Comment) (frequent rest breaks due to dizziness, resolves with rest) Patient left: with call bell/phone within reach;in chair;with chair alarm set Nurse Communication: Mobility status PT Visit Diagnosis: Unsteadiness on feet (R26.81);Other abnormalities of gait and mobility (R26.89);Muscle weakness (generalized) (M62.81);History of falling (Z91.81);Difficulty in walking, not elsewhere classified (R26.2)     Time: 8878-8783 PT Time Calculation (min) (ACUTE ONLY): 55 min  Charges:    $Gait Training: 8-22 mins $Therapeutic Activity: 23-37 mins $Canalith Rep Proc: 8-22 mins PT General Charges $$ ACUTE PT VISIT: 1 Visit                     Stann, PT Acute Rehabilitation Services Office: [phone] 04/08/2024    Stann [name] 04/08/2024, 12:57 PM

## 2024-04-08 NOTE — Progress Notes (Signed)
 Discharge medication delivered to patient at bedside d [facility]

## 2024-04-08 NOTE — TOC Transition Note (Signed)
 Transition of Care [facility]) - Discharge Note   Patient Details  Name: Kristin Gilmore MRN: [id number] Date of Birth: 05-Sep-1951  [facility] CM/SW Contact:  Tawni [name], LCSW Phone Number: 04/08/2024, 1:25 PM   Clinical Narrative:    CSW spoke with the pt regarding recommendations for home health services. The pt is agreeable and has no preferences for a home health agency. The pt also reports she needs to contact her private duty care services to arrange for additional help at home. [facility] was able to accept the pt for HHPT/OT. No further TOC needs TOC sign off.   Final next level of care: Home w Home Health Services     Patient Goals and CMS Choice            Discharge Placement                       Discharge Plan and Services Additional resources added to the After Visit Summary for                                       Social Drivers of Health (SDOH) Interventions SDOH Screenings   Food Insecurity: No Food Insecurity (04/03/2024)  Housing: Low Risk  (04/03/2024)  Transportation Needs: No Transportation Needs (04/03/2024)  Utilities: Not At Risk (04/03/2024)  Depression (PHQ2-9): Low Risk  (04/18/2023)  Financial Resource Strain: Patient Declined (02/26/2024)   Received from [facility]  Social Connections: Moderately Integrated (04/03/2024)  Tobacco Use: Low Risk  (04/06/2024)     Readmission Risk Interventions     No data to display

## 2024-04-08 NOTE — Discharge Summary (Signed)
 Physician Discharge Summary  Kristin Gilmore FMW:[id number] DOB: 1950/11/24 DOA: 04/03/2024  PCP: [name], DO  Admit date: 04/03/2024 Discharge date: 04/08/2024  Admitted From: Home Discharge disposition: Home with home health PT  Recommendations at discharge:  Continue regular stool softeners to avoid constipation Carvedilol  dose has been reduced.  Amlodipine  has been stopped   Brief narrative: Kristin Gilmore is a 73 y.o. female with PMH significant for Parkinson's disease, prediabetes, HTN, HLD, neuropathy, pituitary microadenoma, anxiety/depression, arthritis, migraine, chronic constipation. 2 days prior, patient took laxatives followed by nonbloody diarrhea which made him weak, lightheaded and hence presented to the ED on 7/5. Patient denied rectal bleeding or melena Labs showed drop in hemoglobin to 9.7 from a previous normal of 12.3. FOBT was positive  CT abdomen was obtained which showed fluid filled colon with wall thickening involving the sigmoid colon and rectum consistent with nonspecific, inflammatory or ischemic colitis.   Admitted to [facility] Started on IV fluid GI consulted Flex sig 7/8 showed a few ulcers in the sigmoid colon. Biopsied.  Subjective: Patient was seen and examined this morning.  Pleasant elderly Caucasian female. Sitting up in recliner.  Not in distress.  Has baseline tremors or Parkinson's disease. Did not have dizziness today on sitting up to the chair. Feels ready for discharge home today.  Hospital course : Colitis FOBT positive Chronic constipation Patient has chronic constipation and takes laxative intermittently.   Presented at this time with diarrhea after taking laxative for constipation. CT scan finding suggestive of colitis with colon wall thickening GI was consulted Flex sig 7/8 showed a few ulcers in the sigmoid colon. Biopsied. Sigmoid colon ulcers likely due to chronic constipation.  Diarrhea has  stopped.  Intermittent vertigo Suspect orthostatic hypotension H/o hypertension Intermittent sudden.  Likely related to dysautonomia with Parkinson's disease.  Symptoms probably made worse by diarrhea loss.   PTA meds- carvedilol  12.5 mg twice daily, amlodipine  5 mg daily I reduced carvedilol  to 6.25 mg twice daily.  Amlodipine  has been stopped I would not suggest aggressive treatment of hypertension.  Target systolic blood pressure less than 150.  Mild acute anemia Presumed secondary to GI bleeding. Baseline hemoglobin of 12.. Hemoglobin drifted downward to a low of 9.7 and stabilized. Continue to monitor Recent Labs    04/04/24 0339 04/05/24 0342 04/06/24 0406 04/07/24 0910 04/08/24 1010  HGB 9.7* 9.7* 10.0* 11.3* 10.6*  MCV 97.0 96.4 95.6 97.2 97.0   AKI on CKD stage IIIa Baseline creatinine of about 1.2-1.5. Creatinine of 2.13 on admission. Improved to baseline with IV hydration Recent Labs    06/13/23 1430 06/16/23 1243 07/17/23 1314 01/30/24 1556 04/03/24 0927 04/04/24 0339 04/05/24 0342 04/06/24 0406 04/07/24 0910  BUN 37* 28* 27 34* 48* 33* 21 14 16   CREATININE 1.15* 1.06* 1.26* 1.51* 2.14* 1.63* 1.31* 1.28* 1.30*   Lactic acidosis Lactic acid of 2.5 on admission with peak of 3.2.  Likely secondary to dehydration.  Lactic acid level normalized with IV fluid. Recent Labs  Lab 04/03/24 1048 04/03/24 1109 04/03/24 1313 04/04/24 0339 04/05/24 0342 04/06/24 0406 04/07/24 0910 04/08/24 1010  WBC  --   --   --  9.2 8.3 5.1 5.0 5.9  LATICACIDVEN 2.5* 3.2* 1.3  --   --   --   --   --     Possible cirrhosis Noted on CT imaging. Evidence of hepatic steatosis as well.   Hyperlipidemia Aortic atherosclerosis Continue Crestor    Parkinson disease Continue Sinemet    Mononeuropathy  Continue Lyrica    Impaired mobility  Seen by PT.  Home with PT recommended  Goals of care   Code Status: Full Code   Diet:  Diet Order             Diet general            Diet regular Fluid consistency: Thin  Diet effective now                   Nutritional status:  Body mass index is 30.56 kg/m.       Wounds:  -    Discharge Exam:   Vitals:   04/07/24 1127 04/07/24 1957 04/07/24 2210 04/08/24 0450  BP: 136/78 104/72 (!) 158/76 132/74  Pulse: 68 73 74 71  Resp: 14 14  16   Temp: 98.1 F (36.7 C) 98 F (36.7 C)  98 F (36.7 C)  TempSrc:  Oral  Oral  SpO2: 96% 97%    Weight:      Height:        Body mass index is 30.56 kg/m.  General exam: Pleasant, elderly, not in distress Skin: No rashes, lesions or ulcers. HEENT: Atraumatic, normocephalic, no obvious bleeding Lungs: Clear to auscultation bilaterally,  CVS: S1, S2, no murmur,   GI/Abd: Soft, nontender, nondistended, bowel sound present,   CNS: Has resting tremors from Parkinson's disease.  Alert, awake, oriented x 3 Psychiatry: Mood appropriate,  Extremities: No pedal edema, no calf tenderness,   Follow ups:    Follow-up Information     [name], DO Follow up.   Specialty: Family Medicine Contact information: [address] [place] [address] [phone]                 Discharge Instructions:   Discharge Instructions     Call MD for:  difficulty breathing, headache or visual disturbances   Complete by: As directed    Call MD for:  extreme fatigue   Complete by: As directed    Call MD for:  hives   Complete by: As directed    Call MD for:  persistant dizziness or light-headedness   Complete by: As directed    Call MD for:  persistant nausea and vomiting   Complete by: As directed    Call MD for:  severe uncontrolled pain   Complete by: As directed    Call MD for:  temperature >100.4   Complete by: As directed    Diet general   Complete by: As directed    Discharge instructions   Complete by: As directed    Recommendations at discharge:   Continue regular stool softeners to avoid constipation  Carvedilol  dose has been  reduced.  Amlodipine  has been stopped  General discharge instructions: Follow with Primary MD [name], DO in 7 days  Please request your PCP  to go over your hospital tests, procedures, radiology results at the follow up. Please get your medicines reviewed and adjusted.  Your PCP may decide to repeat certain labs or tests as needed. Do not drive, operate heavy machinery, perform activities at heights, swimming or participation in water activities or provide baby sitting services if your were admitted for syncope or siezures until you have seen by Primary MD or a Neurologist and advised to do so again. [place]  Controlled Substance Reporting System database was reviewed. Do not drive, operate heavy machinery, perform activities at heights, swim, participate in water activities or provide baby-sitting services while on medications for  pain, sleep and mood until your outpatient physician has reevaluated you and advised to do so again.  You are strongly recommended to comply with the dose, frequency and duration of prescribed medications. Activity: As tolerated with Full fall precautions use walker/cane & assistance as needed Avoid using any recreational substances like cigarette, tobacco, alcohol, or non-prescribed drug. If you experience worsening of your admission symptoms, develop shortness of breath, life threatening emergency, suicidal or homicidal thoughts you must seek medical attention immediately by calling 911 or calling your MD immediately  if symptoms less severe. You must read complete instructions/literature along with all the possible adverse reactions/side effects for all the medicines you take and that have been prescribed to you. Take any new medicine only after you have completely understood and accepted all the possible adverse reactions/side effects.  Wear Seat belts while driving. You were cared for by a hospitalist during your hospital stay. If you have any questions about  your discharge medications or the care you received while you were in the hospital after you are discharged, you can call the unit and ask to speak with the hospitalist or the covering physician. Once you are discharged, your primary care physician will handle any further medical issues. Please note that NO REFILLS for any discharge medications will be authorized once you are discharged, as it is imperative that you return to your primary care physician (or establish a relationship with a primary care physician if you do not have one).   Increase activity slowly   Complete by: As directed        Discharge Medications:   Allergies as of 04/08/2024       Reactions   Amoxicillin Hives, Rash   UNSPECIFIED REACTION  Has patient had a PCN reaction causing immediate rash, facial/tongue/throat swelling, SOB or lightheadedness with hypotension: Unknown Has patient had a PCN reaction causing severe rash involving mucus membranes or skin necrosis: Unknown Has patient had a PCN reaction that required hospitalization: Unknown Has patient had a PCN reaction occurring within the last 10 years: No If all of the above answers are NO, then may proceed with Cephalosporin use. Sweats Other Reaction(s): Unknown   Clarithromycin Hives, Other (See Comments), Rash   Other Reaction(s): GI Intolerance, Other (See Comments), Unknown   Levofloxacin Other (See Comments)   Dizziness (intolerance) Other Reaction(s): Dizziness Dizziness (intolerance)  Other Reaction(s): Dizziness levofloxacin Dizziness (intolerance)  Other Reaction(s): Dizziness    Dizziness (intolerance)  Other Reaction(s): Dizziness   Atropine Hives, Rash   Amoxicillin-pot Clavulanate Rash, Dermatitis, Hives   Other Reaction(s): GI Intolerance        Medication List     STOP taking these medications    amLODipine  5 MG tablet Commonly known as: NORVASC        TAKE these medications    Aimovig  140 MG/ML Soaj Generic drug:  Erenumab -aooe Inject 140 mg into the skin every 30 (thirty) days.   amitriptyline  10 MG tablet Commonly known as: ELAVIL  Take 1 tablet (10 mg total) by mouth at bedtime as needed for sleep. What changed:  how much to take when to take this   aspirin  EC 81 MG tablet Take 81 mg by mouth daily. Swallow whole.   Azelastine HCl 137 MCG/SPRAY Soln Place 1 spray into both nostrils in the morning and at bedtime. AS DIRECTED   Biofreeze 4 % Gel Generic drug: Menthol  (Topical Analgesic) Apply 1 application  topically 3 (three) times daily as needed (pain).   bisacodyl  5 MG  EC tablet Commonly known as: DULCOLAX Take 5-15 mg by mouth at bedtime as needed (constipation).   bisacodyl  10 MG suppository Commonly known as: DULCOLAX Place 10 mg rectally as needed for moderate constipation.   Black Cohosh 200 MG Caps Take 200 mg by mouth daily.   Brain Support Caps Take 1 capsule by mouth in the morning and at bedtime.   CALCIUM  CITRATE + D PO Take 1-2 capsules by mouth See admin instructions. Calcium  200 mg, vitamin D3 2.5 mcg - 100 units - take 1 tablet by mouth with breakfast and 2 tablets with supper   carbidopa -levodopa  25-100 MG tablet Commonly known as: SINEMET  IR Take 1 tablet by mouth 3 (three) times daily.   carvedilol  6.25 MG tablet Commonly known as: COREG  Take 1 tablet (6.25 mg total) by mouth 2 (two) times daily. What changed:  medication strength how much to take   CoQ10 100 MG Caps Take 100 mg by mouth daily.   divalproex  250 MG 24 hr tablet Commonly known as: DEPAKOTE  ER Take 1 tablet (250 mg total) by mouth 2 (two) times daily. What changed: when to take this   docusate sodium  100 MG capsule Commonly known as: COLACE Take 300 mg by mouth at bedtime as needed for moderate constipation.   HYDROcodone -acetaminophen  5-325 MG tablet Commonly known as: NORCO/VICODIN Take 1 tablet by mouth every 6 (six) hours as needed for severe pain (pain score 7-10).    levocetirizine 5 MG tablet Commonly known as: XYZAL Take 5 mg by mouth every evening.   metoCLOPramide  10 MG tablet Commonly known as: REGLAN  Take 1 tablet (10 mg total) by mouth every 6 (six) hours.   nitroGLYCERIN  0.4 MG SL tablet Commonly known as: NITROSTAT  Place 0.4 mg under the tongue every 5 (five) minutes as needed for chest pain.   pregabalin  75 MG capsule Commonly known as: LYRICA  TAKE 1 CAPSULE BY MOUTH THREE TIMES A DAY What changed: See the new instructions.   QUEtiapine  300 MG tablet Commonly known as: SEROQUEL  TAKE 0.5 TABLETS (150 MG TOTAL) BY MOUTH AT BEDTIME AS NEEDED.   rosuvastatin  20 MG tablet Commonly known as: CRESTOR  Take 20 mg by mouth daily.   Turmeric 500 MG Caps Take 500 mg by mouth 2 (two) times daily with a meal. extract   Vitamin D3 50 MCG (2000 UT) capsule Take 2,000 Units by mouth daily.         The results of significant diagnostics from this hospitalization (including imaging, microbiology, ancillary and laboratory) are listed below for reference.    Procedures and Diagnostic Studies:   DG HIP PORT UNILAT WITH PELVIS 1V LEFT Result Date: 04/03/2024 CLINICAL DATA:  Fall 1 week ago, hip pain EXAM: DG HIP (WITH OR WITHOUT PELVIS) 1V PORT LEFT COMPARISON:  05/31/2022 FINDINGS: Limited single AP view of the left hip. No visible fracture. No subluxation or dislocation. Soft tissues are intact. IMPRESSION: Limited study.  No visible fracture. Electronically Signed   By: Franky Crease M.D.   On: 04/03/2024 16:10   CT ANGIO GI BLEED Result Date: 04/03/2024 CLINICAL DATA:  Diarrhea for 2 days, history of colitis, concern for mesenteric ischemia EXAM: CTA ABDOMEN AND PELVIS WITHOUT AND WITH CONTRAST TECHNIQUE: Multidetector CT imaging of the abdomen and pelvis was performed using the standard protocol during bolus administration of intravenous contrast. Multiplanar reconstructed images and MIPs were obtained and reviewed to evaluate the vascular  anatomy. RADIATION DOSE REDUCTION: This exam was performed according to the departmental dose-optimization program  which includes automated exposure control, adjustment of the mA and/or kV according to patient size and/or use of iterative reconstruction technique. CONTRAST:  80mL OMNIPAQUE  IOHEXOL  350 MG/ML SOLN COMPARISON:  None Available. FINDINGS: VASCULAR Normal contour and caliber of the abdominal aorta. No evidence of aortic aneurysm, dissection, or other acute aortic pathology. Severe mixed aortic atherosclerosis. Standard branching pattern of the abdominal aorta with solitary bilateral renal arteries. 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 Remaining branch vessel origins are patent. Review of the MIP images confirms the above findings. NON-VASCULAR Lower Chest: No acute findings.  Small hiatal hernia. Hepatobiliary: No focal liver abnormality is seen. Mildly coarse contour of the liver. Hepatic steatosis. Status post cholecystectomy. No biliary dilatation. Pancreas: Unremarkable. No pancreatic ductal dilatation or surrounding inflammatory changes. Spleen: Normal in size without significant abnormality. Adrenals/Urinary Tract: Adrenal glands are unremarkable. Kidneys are normal, without renal calculi, solid lesion, or hydronephrosis. Bladder is unremarkable. Stomach/Bowel: Stomach is within normal limits. The colon is partially fluid-filled, with long segment wall thickening involving the sigmoid colon and rectum (series 6, image 173, 189). Radiodense ingested debris in the cecal base, which is present on precontrast images and does not reflect arterial contrast extravasation (series 4, image 66). Lymphatic: No enlarged abdominal or pelvic lymph  nodes. Reproductive: No mass or other significant abnormality. Other: Fat containing umbilical hernia.  No ascites. Musculoskeletal: No acute osseous findings. IMPRESSION: 1. The superior mesenteric and inferior mesenteric arteries are patent and opacified through their proximal order branches. 2. 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 3. The colon is partially fluid-filled, with long segment wall thickening involving the sigmoid colon and rectum, consistent with nonspecific infectious, inflammatory, or ischemic colitis. 4. Hepatic steatosis and mildly coarse contour of the liver, suggestive of cirrhosis. Aortic Atherosclerosis (ICD10-I70.0). Electronically Signed   By: Marolyn JONETTA Jaksch M.D.   On: 04/03/2024 13:12     Labs:   Basic Metabolic Panel: Recent Labs  Lab 04/03/24 0927 04/04/24 0339 04/05/24 0342 04/06/24 0406 04/07/24 0910  NA 136 136 138 140 137  K 5.1 4.5 4.2 3.9 4.3  CL 99 100 102 106 101  CO2 23 24 25 23 25   GLUCOSE 133* 118* 111* 117* 177*  BUN 48* 33* 21 14 16   CREATININE 2.14* 1.63* 1.31* 1.28* 1.30*  CALCIUM  8.2* 8.3* 8.6* 8.8* 8.8*   GFR Estimated Creatinine Clearance: 43.4 mL/min (A) (by C-G formula based on SCr of 1.3 mg/dL (H)). Liver Function Tests: Recent Labs  Lab 04/03/24 0927  AST 13*  ALT <5  ALKPHOS 84  BILITOT 1.0  PROT 5.9*  ALBUMIN  3.2*   Recent Labs  Lab 04/03/24 0927  LIPASE 23   No results for input(s): AMMONIA in the last 168 hours. Coagulation profile No results for input(s): INR, PROTIME in the last 168 hours.  CBC: Recent Labs  Lab 04/03/24 0927 04/04/24 0339 04/05/24 0342 04/06/24 0406 04/07/24 0910 04/08/24 1010  WBC 7.2 9.2 8.3 5.1 5.0 5.9  NEUTROABS 5.7  --   --   --   --   --   HGB  10.5* 9.7*  9.7* 10.0* 11.3* 10.6*  HCT 31.9* 29.3* 29.6* 30.6* 34.8* 32.8*  MCV 97.6 97.0 96.4 95.6 97.2 97.0  PLT 175 178 177 189 239 223   Cardiac Enzymes: No results for input(s): CKTOTAL, CKMB, CKMBINDEX, TROPONINI in the last 168 hours. BNP: Invalid input(s): POCBNP CBG: Recent Labs  Lab 04/06/24 0828  GLUCAP 120*   D-Dimer No results for input(s): DDIMER in the last 72 hours. Hgb A1c No results for input(s): HGBA1C in the last 72 hours. Lipid Profile No results for input(s): CHOL, HDL, LDLCALC, TRIG, CHOLHDL, LDLDIRECT in the last 72 hours. Thyroid function studies No results for input(s): TSH, T4TOTAL, T3FREE, THYROIDAB in the last 72 hours.  Invalid input(s): FREET3 Anemia work up No results for input(s): VITAMINB12, FOLATE, FERRITIN, TIBC, IRON , RETICCTPCT in the last 72 hours. Microbiology No results found for this or any previous visit (from the past 240 hours).  Time coordinating discharge: 45 minutes  Signed: Essa Wenk  [facility] 04/08/2024, 11:41 AM

## 2024-04-16 DIAGNOSIS — R42 Dizziness and giddiness: Secondary | ICD-10-CM | POA: Diagnosis not present

## 2024-04-16 DIAGNOSIS — R519 Headache, unspecified: Secondary | ICD-10-CM | POA: Diagnosis not present

## 2024-05-06 ENCOUNTER — Ambulatory Visit: Admitting: Podiatry

## 2024-05-06 DIAGNOSIS — J0101 Acute recurrent maxillary sinusitis: Secondary | ICD-10-CM | POA: Diagnosis not present

## 2024-05-06 DIAGNOSIS — M48061 Spinal stenosis, lumbar region without neurogenic claudication: Secondary | ICD-10-CM | POA: Diagnosis not present

## 2024-05-06 DIAGNOSIS — I10 Essential (primary) hypertension: Secondary | ICD-10-CM | POA: Diagnosis not present

## 2024-05-06 DIAGNOSIS — E785 Hyperlipidemia, unspecified: Secondary | ICD-10-CM | POA: Diagnosis not present

## 2024-05-06 DIAGNOSIS — F319 Bipolar disorder, unspecified: Secondary | ICD-10-CM | POA: Diagnosis not present

## 2024-05-06 DIAGNOSIS — Z7982 Long term (current) use of aspirin: Secondary | ICD-10-CM | POA: Diagnosis not present

## 2024-05-06 DIAGNOSIS — G20A1 Parkinson's disease without dyskinesia, without mention of fluctuations: Secondary | ICD-10-CM | POA: Diagnosis not present

## 2024-05-06 DIAGNOSIS — R7303 Prediabetes: Secondary | ICD-10-CM | POA: Diagnosis not present

## 2024-05-06 DIAGNOSIS — Z7984 Long term (current) use of oral hypoglycemic drugs: Secondary | ICD-10-CM | POA: Diagnosis not present

## 2024-05-06 DIAGNOSIS — F419 Anxiety disorder, unspecified: Secondary | ICD-10-CM | POA: Diagnosis not present

## 2024-05-10 DIAGNOSIS — F419 Anxiety disorder, unspecified: Secondary | ICD-10-CM | POA: Diagnosis not present

## 2024-05-10 DIAGNOSIS — G20A1 Parkinson's disease without dyskinesia, without mention of fluctuations: Secondary | ICD-10-CM | POA: Diagnosis not present

## 2024-05-10 DIAGNOSIS — F319 Bipolar disorder, unspecified: Secondary | ICD-10-CM | POA: Diagnosis not present

## 2024-05-10 DIAGNOSIS — M48061 Spinal stenosis, lumbar region without neurogenic claudication: Secondary | ICD-10-CM | POA: Diagnosis not present

## 2024-05-10 DIAGNOSIS — J0101 Acute recurrent maxillary sinusitis: Secondary | ICD-10-CM | POA: Diagnosis not present

## 2024-05-10 DIAGNOSIS — I10 Essential (primary) hypertension: Secondary | ICD-10-CM | POA: Diagnosis not present

## 2024-05-10 DIAGNOSIS — Z7984 Long term (current) use of oral hypoglycemic drugs: Secondary | ICD-10-CM | POA: Diagnosis not present

## 2024-05-10 DIAGNOSIS — Z7982 Long term (current) use of aspirin: Secondary | ICD-10-CM | POA: Diagnosis not present

## 2024-05-10 DIAGNOSIS — R7303 Prediabetes: Secondary | ICD-10-CM | POA: Diagnosis not present

## 2024-05-10 DIAGNOSIS — E785 Hyperlipidemia, unspecified: Secondary | ICD-10-CM | POA: Diagnosis not present

## 2024-05-12 DIAGNOSIS — R29898 Other symptoms and signs involving the musculoskeletal system: Secondary | ICD-10-CM | POA: Diagnosis not present

## 2024-05-12 DIAGNOSIS — Z79899 Other long term (current) drug therapy: Secondary | ICD-10-CM | POA: Diagnosis not present

## 2024-05-12 DIAGNOSIS — G4486 Cervicogenic headache: Secondary | ICD-10-CM | POA: Diagnosis not present

## 2024-05-12 DIAGNOSIS — M47812 Spondylosis without myelopathy or radiculopathy, cervical region: Secondary | ICD-10-CM | POA: Diagnosis not present

## 2024-05-12 DIAGNOSIS — M5416 Radiculopathy, lumbar region: Secondary | ICD-10-CM | POA: Diagnosis not present

## 2024-05-12 DIAGNOSIS — Z5181 Encounter for therapeutic drug level monitoring: Secondary | ICD-10-CM | POA: Diagnosis not present

## 2024-05-13 DIAGNOSIS — G20A1 Parkinson's disease without dyskinesia, without mention of fluctuations: Secondary | ICD-10-CM | POA: Diagnosis not present

## 2024-05-13 DIAGNOSIS — I10 Essential (primary) hypertension: Secondary | ICD-10-CM | POA: Diagnosis not present

## 2024-05-13 DIAGNOSIS — R7303 Prediabetes: Secondary | ICD-10-CM | POA: Diagnosis not present

## 2024-05-13 DIAGNOSIS — Z7982 Long term (current) use of aspirin: Secondary | ICD-10-CM | POA: Diagnosis not present

## 2024-05-13 DIAGNOSIS — J0101 Acute recurrent maxillary sinusitis: Secondary | ICD-10-CM | POA: Diagnosis not present

## 2024-05-13 DIAGNOSIS — M48061 Spinal stenosis, lumbar region without neurogenic claudication: Secondary | ICD-10-CM | POA: Diagnosis not present

## 2024-05-13 DIAGNOSIS — E785 Hyperlipidemia, unspecified: Secondary | ICD-10-CM | POA: Diagnosis not present

## 2024-05-13 DIAGNOSIS — F319 Bipolar disorder, unspecified: Secondary | ICD-10-CM | POA: Diagnosis not present

## 2024-05-13 DIAGNOSIS — F419 Anxiety disorder, unspecified: Secondary | ICD-10-CM | POA: Diagnosis not present

## 2024-05-13 DIAGNOSIS — Z7984 Long term (current) use of oral hypoglycemic drugs: Secondary | ICD-10-CM | POA: Diagnosis not present

## 2024-05-18 DIAGNOSIS — F419 Anxiety disorder, unspecified: Secondary | ICD-10-CM | POA: Diagnosis not present

## 2024-05-18 DIAGNOSIS — K219 Gastro-esophageal reflux disease without esophagitis: Secondary | ICD-10-CM | POA: Diagnosis not present

## 2024-05-18 DIAGNOSIS — R109 Unspecified abdominal pain: Secondary | ICD-10-CM | POA: Diagnosis not present

## 2024-05-18 DIAGNOSIS — M48061 Spinal stenosis, lumbar region without neurogenic claudication: Secondary | ICD-10-CM | POA: Diagnosis not present

## 2024-05-18 DIAGNOSIS — J0101 Acute recurrent maxillary sinusitis: Secondary | ICD-10-CM | POA: Diagnosis not present

## 2024-05-18 DIAGNOSIS — K760 Fatty (change of) liver, not elsewhere classified: Secondary | ICD-10-CM | POA: Diagnosis not present

## 2024-05-18 DIAGNOSIS — R7303 Prediabetes: Secondary | ICD-10-CM | POA: Diagnosis not present

## 2024-05-18 DIAGNOSIS — G20A1 Parkinson's disease without dyskinesia, without mention of fluctuations: Secondary | ICD-10-CM | POA: Diagnosis not present

## 2024-05-18 DIAGNOSIS — Z7984 Long term (current) use of oral hypoglycemic drugs: Secondary | ICD-10-CM | POA: Diagnosis not present

## 2024-05-18 DIAGNOSIS — I10 Essential (primary) hypertension: Secondary | ICD-10-CM | POA: Diagnosis not present

## 2024-05-18 DIAGNOSIS — E785 Hyperlipidemia, unspecified: Secondary | ICD-10-CM | POA: Diagnosis not present

## 2024-05-18 DIAGNOSIS — D509 Iron deficiency anemia, unspecified: Secondary | ICD-10-CM | POA: Diagnosis not present

## 2024-05-18 DIAGNOSIS — F319 Bipolar disorder, unspecified: Secondary | ICD-10-CM | POA: Diagnosis not present

## 2024-05-18 DIAGNOSIS — Z7982 Long term (current) use of aspirin: Secondary | ICD-10-CM | POA: Diagnosis not present

## 2024-05-19 ENCOUNTER — Other Ambulatory Visit (HOSPITAL_COMMUNITY): Payer: Self-pay | Admitting: Gastroenterology

## 2024-05-19 DIAGNOSIS — R1032 Left lower quadrant pain: Secondary | ICD-10-CM

## 2024-05-20 ENCOUNTER — Ambulatory Visit (HOSPITAL_COMMUNITY)
Admission: RE | Admit: 2024-05-20 | Discharge: 2024-05-20 | Disposition: A | Discharge status: Home / Self Care | Source: Ambulatory Visit | Attending: Gastroenterology | Admitting: Gastroenterology

## 2024-05-20 DIAGNOSIS — I10 Essential (primary) hypertension: Secondary | ICD-10-CM | POA: Diagnosis not present

## 2024-05-20 DIAGNOSIS — R7303 Prediabetes: Secondary | ICD-10-CM | POA: Diagnosis not present

## 2024-05-20 DIAGNOSIS — K449 Diaphragmatic hernia without obstruction or gangrene: Secondary | ICD-10-CM | POA: Diagnosis not present

## 2024-05-20 DIAGNOSIS — R1032 Left lower quadrant pain: Secondary | ICD-10-CM | POA: Diagnosis not present

## 2024-05-20 DIAGNOSIS — Q2733 Arteriovenous malformation of digestive system vessel: Secondary | ICD-10-CM | POA: Diagnosis not present

## 2024-05-20 DIAGNOSIS — Z7982 Long term (current) use of aspirin: Secondary | ICD-10-CM | POA: Diagnosis not present

## 2024-05-20 DIAGNOSIS — F319 Bipolar disorder, unspecified: Secondary | ICD-10-CM | POA: Diagnosis not present

## 2024-05-20 DIAGNOSIS — M48061 Spinal stenosis, lumbar region without neurogenic claudication: Secondary | ICD-10-CM | POA: Diagnosis not present

## 2024-05-20 DIAGNOSIS — Z7984 Long term (current) use of oral hypoglycemic drugs: Secondary | ICD-10-CM | POA: Diagnosis not present

## 2024-05-20 DIAGNOSIS — E785 Hyperlipidemia, unspecified: Secondary | ICD-10-CM | POA: Diagnosis not present

## 2024-05-20 DIAGNOSIS — J0101 Acute recurrent maxillary sinusitis: Secondary | ICD-10-CM | POA: Diagnosis not present

## 2024-05-20 DIAGNOSIS — K429 Umbilical hernia without obstruction or gangrene: Secondary | ICD-10-CM | POA: Diagnosis not present

## 2024-05-20 DIAGNOSIS — G20A1 Parkinson's disease without dyskinesia, without mention of fluctuations: Secondary | ICD-10-CM | POA: Diagnosis not present

## 2024-05-20 DIAGNOSIS — F419 Anxiety disorder, unspecified: Secondary | ICD-10-CM | POA: Diagnosis not present

## 2024-05-20 MED ORDER — IOHEXOL 300 MG/ML  SOLN
80.0000 mL | Freq: Once | INTRAMUSCULAR | Status: AC | PRN
  Administered 2024-05-20: 80 mL via INTRAVENOUS

## 2024-05-21 ENCOUNTER — Other Ambulatory Visit (HOSPITAL_COMMUNITY): Payer: Self-pay | Admitting: Gastroenterology

## 2024-05-21 DIAGNOSIS — R109 Unspecified abdominal pain: Secondary | ICD-10-CM

## 2024-05-24 ENCOUNTER — Ambulatory Visit (HOSPITAL_COMMUNITY)
Admission: RE | Admit: 2024-05-24 | Discharge: 2024-05-24 | Disposition: A | Discharge status: Home / Self Care | Source: Ambulatory Visit | Attending: Gastroenterology | Admitting: Gastroenterology

## 2024-05-24 DIAGNOSIS — R634 Abnormal weight loss: Secondary | ICD-10-CM | POA: Diagnosis not present

## 2024-05-24 DIAGNOSIS — R109 Unspecified abdominal pain: Secondary | ICD-10-CM | POA: Insufficient documentation

## 2024-05-24 DIAGNOSIS — I700 Atherosclerosis of aorta: Secondary | ICD-10-CM | POA: Diagnosis not present

## 2024-05-24 DIAGNOSIS — E1141 Type 2 diabetes mellitus with diabetic mononeuropathy: Secondary | ICD-10-CM | POA: Insufficient documentation

## 2024-05-24 DIAGNOSIS — Z981 Arthrodesis status: Secondary | ICD-10-CM | POA: Diagnosis not present

## 2024-05-24 DIAGNOSIS — K429 Umbilical hernia without obstruction or gangrene: Secondary | ICD-10-CM | POA: Diagnosis not present

## 2024-05-24 LAB — POCT I-STAT CREATININE: Creatinine, Ser: 1.4 mg/dL — ABNORMAL HIGH (ref 0.44–1.00)

## 2024-05-24 MED ORDER — IOHEXOL 350 MG/ML SOLN
80.0000 mL | Freq: Once | INTRAVENOUS | Status: AC | PRN
  Administered 2024-05-24: 80 mL via INTRAVENOUS

## 2024-05-28 ENCOUNTER — Other Ambulatory Visit: Payer: Self-pay

## 2024-05-28 ENCOUNTER — Emergency Department (HOSPITAL_BASED_OUTPATIENT_CLINIC_OR_DEPARTMENT_OTHER)

## 2024-05-28 ENCOUNTER — Encounter (HOSPITAL_BASED_OUTPATIENT_CLINIC_OR_DEPARTMENT_OTHER): Payer: Self-pay | Admitting: Emergency Medicine

## 2024-05-28 ENCOUNTER — Observation Stay (HOSPITAL_BASED_OUTPATIENT_CLINIC_OR_DEPARTMENT_OTHER)
Admission: EM | Admit: 2024-05-28 | Discharge: 2024-05-30 | Disposition: A | Discharge status: Home / Self Care | Attending: Internal Medicine | Admitting: Internal Medicine

## 2024-05-28 DIAGNOSIS — G43909 Migraine, unspecified, not intractable, without status migrainosus: Secondary | ICD-10-CM | POA: Insufficient documentation

## 2024-05-28 DIAGNOSIS — G20C Parkinsonism, unspecified: Secondary | ICD-10-CM | POA: Diagnosis not present

## 2024-05-28 DIAGNOSIS — R110 Nausea: Secondary | ICD-10-CM

## 2024-05-28 DIAGNOSIS — K429 Umbilical hernia without obstruction or gangrene: Secondary | ICD-10-CM | POA: Diagnosis not present

## 2024-05-28 DIAGNOSIS — R109 Unspecified abdominal pain: Secondary | ICD-10-CM | POA: Diagnosis not present

## 2024-05-28 DIAGNOSIS — I6782 Cerebral ischemia: Secondary | ICD-10-CM | POA: Diagnosis not present

## 2024-05-28 DIAGNOSIS — F3171 Bipolar disorder, in partial remission, most recent episode hypomanic: Secondary | ICD-10-CM | POA: Diagnosis present

## 2024-05-28 DIAGNOSIS — K529 Noninfective gastroenteritis and colitis, unspecified: Secondary | ICD-10-CM | POA: Diagnosis not present

## 2024-05-28 DIAGNOSIS — R54 Age-related physical debility: Secondary | ICD-10-CM | POA: Insufficient documentation

## 2024-05-28 DIAGNOSIS — J0100 Acute maxillary sinusitis, unspecified: Secondary | ICD-10-CM | POA: Insufficient documentation

## 2024-05-28 DIAGNOSIS — E1141 Type 2 diabetes mellitus with diabetic mononeuropathy: Secondary | ICD-10-CM | POA: Diagnosis not present

## 2024-05-28 DIAGNOSIS — G894 Chronic pain syndrome: Secondary | ICD-10-CM | POA: Diagnosis not present

## 2024-05-28 DIAGNOSIS — I10 Essential (primary) hypertension: Secondary | ICD-10-CM | POA: Diagnosis present

## 2024-05-28 DIAGNOSIS — R42 Dizziness and giddiness: Secondary | ICD-10-CM | POA: Diagnosis not present

## 2024-05-28 DIAGNOSIS — K700 Alcoholic fatty liver: Secondary | ICD-10-CM | POA: Diagnosis not present

## 2024-05-28 DIAGNOSIS — R519 Headache, unspecified: Secondary | ICD-10-CM | POA: Diagnosis not present

## 2024-05-28 DIAGNOSIS — K5900 Constipation, unspecified: Secondary | ICD-10-CM | POA: Diagnosis not present

## 2024-05-28 DIAGNOSIS — K760 Fatty (change of) liver, not elsewhere classified: Secondary | ICD-10-CM | POA: Diagnosis not present

## 2024-05-28 DIAGNOSIS — R1084 Generalized abdominal pain: Secondary | ICD-10-CM | POA: Diagnosis not present

## 2024-05-28 DIAGNOSIS — J3489 Other specified disorders of nose and nasal sinuses: Secondary | ICD-10-CM | POA: Insufficient documentation

## 2024-05-28 DIAGNOSIS — G20A1 Parkinson's disease without dyskinesia, without mention of fluctuations: Secondary | ICD-10-CM | POA: Diagnosis present

## 2024-05-28 LAB — COMPREHENSIVE METABOLIC PANEL WITH GFR
ALT: 5 U/L (ref 0–44)
AST: 17 U/L (ref 15–41)
Albumin: 4.5 g/dL (ref 3.5–5.0)
Alkaline Phosphatase: 126 U/L (ref 38–126)
Anion gap: 17 — ABNORMAL HIGH (ref 5–15)
BUN: 21 mg/dL (ref 8–23)
CO2: 24 mmol/L (ref 22–32)
Calcium: 9.9 mg/dL (ref 8.9–10.3)
Chloride: 98 mmol/L (ref 98–111)
Creatinine, Ser: 1.45 mg/dL — ABNORMAL HIGH (ref 0.44–1.00)
GFR, Estimated: 38 mL/min — ABNORMAL LOW (ref >60)
Glucose, Bld: 113 mg/dL — ABNORMAL HIGH (ref 70–99)
Potassium: 3.8 mmol/L (ref 3.5–5.1)
Sodium: 139 mmol/L (ref 135–145)
Total Bilirubin: 0.3 mg/dL (ref 0.0–1.2)
Total Protein: 6.7 g/dL (ref 6.5–8.1)

## 2024-05-28 LAB — CBC WITH DIFFERENTIAL/PLATELET
Abs Immature Granulocytes: 0.01 K/uL (ref 0.00–0.07)
Basophils Absolute: 0 K/uL (ref 0.0–0.1)
Basophils Relative: 1 %
Eosinophils Absolute: 0.1 K/uL (ref 0.0–0.5)
Eosinophils Relative: 1 %
HCT: 35.5 % — ABNORMAL LOW (ref 36.0–46.0)
Hemoglobin: 12 g/dL (ref 12.0–15.0)
Immature Granulocytes: 0 %
Lymphocytes Relative: 20 %
Lymphs Abs: 1.2 K/uL (ref 0.7–4.0)
MCH: 32.3 pg (ref 26.0–34.0)
MCHC: 33.8 g/dL (ref 30.0–36.0)
MCV: 95.4 fL (ref 80.0–100.0)
Monocytes Absolute: 0.4 K/uL (ref 0.1–1.0)
Monocytes Relative: 7 %
Neutro Abs: 4.2 K/uL (ref 1.7–7.7)
Neutrophils Relative %: 71 %
Platelets: 229 K/uL (ref 150–400)
RBC: 3.72 MIL/uL — ABNORMAL LOW (ref 3.87–5.11)
RDW: 13.2 % (ref 11.5–15.5)
WBC: 5.9 K/uL (ref 4.0–10.5)
nRBC: 0 % (ref 0.0–0.2)

## 2024-05-28 LAB — URINALYSIS, ROUTINE W REFLEX MICROSCOPIC
Bacteria, UA: NONE SEEN
Bilirubin Urine: NEGATIVE
Glucose, UA: >1000 mg/dL — AB
Hgb urine dipstick: NEGATIVE
Ketones, ur: NEGATIVE mg/dL
Nitrite: NEGATIVE
Specific Gravity, Urine: 1.036 — ABNORMAL HIGH (ref 1.005–1.030)
pH: 5.5 (ref 5.0–8.0)

## 2024-05-28 LAB — LIPASE, BLOOD: Lipase: 19 U/L (ref 11–51)

## 2024-05-28 MED ORDER — SODIUM CHLORIDE 0.9 % IV SOLN: Freq: Once | INTRAVENOUS | Status: AC

## 2024-05-28 MED ORDER — ONDANSETRON HCL 4 MG PO TABS: 4.0000 mg | ORAL_TABLET | Freq: Four times a day (QID) | ORAL | Status: DC | PRN

## 2024-05-28 MED ORDER — PROCHLORPERAZINE EDISYLATE 10 MG/2ML IJ SOLN
10.0000 mg | Freq: Once | INTRAMUSCULAR | Status: AC
  Administered 2024-05-28: 10 mg via INTRAVENOUS
  Filled 2024-05-28: qty 2

## 2024-05-28 MED ORDER — DIVALPROEX SODIUM ER 250 MG PO TB24
250.0000 mg | ORAL_TABLET | Freq: Two times a day (BID) | ORAL | Status: DC
  Administered 2024-05-29 – 2024-05-30 (×3): 250 mg via ORAL
  Filled 2024-05-28 (×3): qty 1

## 2024-05-28 MED ORDER — CARVEDILOL 12.5 MG PO TABS
12.5000 mg | ORAL_TABLET | Freq: Two times a day (BID) | ORAL | Status: DC
  Administered 2024-05-29 – 2024-05-30 (×3): 12.5 mg via ORAL
  Filled 2024-05-28 (×3): qty 1

## 2024-05-28 MED ORDER — ACETAMINOPHEN 650 MG RE SUPP: 650.0000 mg | Freq: Four times a day (QID) | RECTAL | Status: DC | PRN

## 2024-05-28 MED ORDER — FENTANYL CITRATE PF 50 MCG/ML IJ SOSY
50.0000 ug | PREFILLED_SYRINGE | Freq: Once | INTRAMUSCULAR | Status: AC
  Administered 2024-05-28: 50 ug via INTRAVENOUS
  Filled 2024-05-28: qty 1

## 2024-05-28 MED ORDER — SODIUM CHLORIDE 0.9 % IV SOLN: INTRAVENOUS | Status: DC

## 2024-05-28 MED ORDER — ENOXAPARIN SODIUM 40 MG/0.4ML IJ SOSY
40.0000 mg | PREFILLED_SYRINGE | INTRAMUSCULAR | Status: DC
  Administered 2024-05-28 – 2024-05-29 (×2): 40 mg via SUBCUTANEOUS
  Filled 2024-05-28 (×2): qty 0.4

## 2024-05-28 MED ORDER — ACETAMINOPHEN 325 MG PO TABS
650.0000 mg | ORAL_TABLET | Freq: Four times a day (QID) | ORAL | Status: DC | PRN
  Administered 2024-05-29: 650 mg via ORAL
  Filled 2024-05-28: qty 2

## 2024-05-28 MED ORDER — DEXAMETHASONE SODIUM PHOSPHATE 10 MG/ML IJ SOLN
10.0000 mg | Freq: Once | INTRAMUSCULAR | Status: AC
  Administered 2024-05-28: 10 mg via INTRAVENOUS
  Filled 2024-05-28: qty 1

## 2024-05-28 MED ORDER — ONDANSETRON HCL 4 MG/2ML IJ SOLN: 4.0000 mg | Freq: Four times a day (QID) | INTRAMUSCULAR | Status: DC | PRN

## 2024-05-28 MED ORDER — MAGNESIUM SULFATE 2 GM/50ML IV SOLN
2.0000 g | Freq: Once | INTRAVENOUS | Status: AC
  Administered 2024-05-28: 2 g via INTRAVENOUS
  Filled 2024-05-28: qty 50

## 2024-05-28 MED ORDER — SODIUM CHLORIDE 0.9 % IV SOLN: INTRAVENOUS | Status: AC

## 2024-05-28 MED ORDER — ONDANSETRON HCL 4 MG/2ML IJ SOLN: 4.0000 mg | Freq: Once | INTRAMUSCULAR | Status: DC

## 2024-05-28 MED ORDER — MORPHINE SULFATE (PF) 2 MG/ML IV SOLN
2.0000 mg | INTRAVENOUS | Status: DC | PRN
  Administered 2024-05-28 – 2024-05-30 (×4): 2 mg via INTRAVENOUS
  Filled 2024-05-28 (×4): qty 1

## 2024-05-28 MED ORDER — CARBIDOPA-LEVODOPA 25-100 MG PO TABS
1.0000 | ORAL_TABLET | Freq: Three times a day (TID) | ORAL | Status: DC
  Administered 2024-05-28 – 2024-05-30 (×6): 1 via ORAL
  Filled 2024-05-28 (×6): qty 1

## 2024-05-28 MED ORDER — POLYETHYLENE GLYCOL 3350 17 G PO PACK
17.0000 g | PACK | Freq: Every day | ORAL | Status: DC | PRN
  Administered 2024-05-29: 17 g via ORAL
  Filled 2024-05-28: qty 1

## 2024-05-28 MED ORDER — AMITRIPTYLINE HCL 25 MG PO TABS
25.0000 mg | ORAL_TABLET | Freq: Every day | ORAL | Status: DC
  Administered 2024-05-29: 25 mg via ORAL
  Filled 2024-05-28: qty 1

## 2024-05-28 MED ORDER — DIPHENHYDRAMINE HCL 50 MG/ML IJ SOLN
12.5000 mg | Freq: Once | INTRAMUSCULAR | Status: AC
  Administered 2024-05-28: 12.5 mg via INTRAVENOUS
  Filled 2024-05-28: qty 1

## 2024-05-28 NOTE — ED Notes (Signed)
 Taryn with [facility] called for transport

## 2024-05-28 NOTE — Assessment & Plan Note (Signed)
Diet controlled. - HgbA1c

## 2024-05-28 NOTE — ED Notes (Signed)
 Assisted patient to bedside commode

## 2024-05-28 NOTE — ED Triage Notes (Signed)
 Pt caox4 from home c/o dizziness and abd pain that has been ongoing since July. Has been evaluated multiple times for same.

## 2024-05-28 NOTE — Assessment & Plan Note (Signed)
 Significant resting tremors on exam. -Resume Sinemet 

## 2024-05-28 NOTE — ED Provider Notes (Addendum)
 [place] EMERGENCY DEPARTMENT AT [facility] Provider Note   CSN: [id number] Arrival date & time: 05/28/24  1232     Patient presents with: Dizziness   Kristin Gilmore is a 73 y.o. female.   Patient here with abdominal pain cramping and nausea cannot eat or drink very well.  Migraines bad today as well.  History of Parkinson's migraines.  Denies any weakness numbness tingling.  Does have some recent CT scans of her abdomen that were unremarkable.  She denies any chest pain shortness of breath.  She lives by herself.  She feels too sick to be alone.  Nausea is really bad.  Abdominal cramping is really bad.  The history is provided by the patient.       Prior to Admission medications   Medication Sig Start Date End Date Taking? Authorizing Provider  AIMOVIG  140 MG/ML SOAJ Inject 140 mg into the skin every 30 (thirty) days.    [provider]  amitriptyline  (ELAVIL ) 10 MG tablet Take 1 tablet (10 mg total) by mouth at bedtime as needed for sleep. Patient taking differently: Take 25 mg by mouth at bedtime. 06/08/22   [name], MD  aspirin  EC 81 MG tablet Take 81 mg by mouth daily. Swallow whole.    [provider]  Azelastine HCl 137 MCG/SPRAY SOLN Place 1 spray into both nostrils in the morning and at bedtime. AS DIRECTED 01/08/24   [provider]  bisacodyl  (DULCOLAX) 10 MG suppository Place 10 mg rectally as needed for moderate constipation.    [provider]  bisacodyl  (DULCOLAX) 5 MG EC tablet Take 5-15 mg by mouth at bedtime as needed (constipation).    [provider]  Black Cohosh 200 MG CAPS Take 200 mg by mouth daily.    [provider]  Calcium  Citrate-Vitamin D  (CALCIUM  CITRATE + D PO) Take 1-2 capsules by mouth See admin instructions. Calcium  200 mg, vitamin D3 2.5 mcg - 100 units - take 1 tablet by mouth with breakfast and 2 tablets with supper    [provider]  carbidopa -levodopa   (SINEMET  IR) 25-100 MG tablet Take 1 tablet by mouth 3 (three) times daily. 12/25/22   [provider]  carvedilol  (COREG ) 6.25 MG tablet Take 1 tablet (6.25 mg total) by mouth 2 (two) times daily. 04/08/24 07/07/24  Arlice Reichert, MD  Cholecalciferol  (VITAMIN D3) 50 MCG (2000 UT) capsule Take 2,000 Units by mouth daily.    [provider]  Coenzyme Q10 (COQ10) 100 MG CAPS Take 100 mg by mouth daily.    [provider]  divalproex  (DEPAKOTE  ER) 250 MG 24 hr tablet Take 1 tablet (250 mg total) by mouth 2 (two) times daily. Patient taking differently: Take 250 mg by mouth daily. 01/19/24   Cottle, Lorene MATSU Mickey., MD  docusate sodium  (COLACE) 100 MG capsule Take 300 mg by mouth at bedtime as needed for moderate constipation.    [provider]  HYDROcodone -acetaminophen  (NORCO/VICODIN) 5-325 MG tablet Take 1 tablet by mouth every 6 (six) hours as needed for severe pain (pain score 7-10). Patient not taking: Reported on 03/19/2024 01/29/24   Geiple, Joshua, PA-C  levocetirizine (XYZAL) 5 MG tablet Take 5 mg by mouth every evening.    [provider]  Menthol , Topical Analgesic, (BIOFREEZE) 4 % GEL Apply 1 application  topically 3 (three) times daily as needed (pain).    [provider]  metoCLOPramide  (REGLAN ) 10 MG tablet Take 1 tablet (10 mg  total) by mouth every 6 (six) hours. 01/29/24   Desiderio Chew, PA-C  nitroGLYCERIN  (NITROSTAT ) 0.4 MG SL tablet Place 0.4 mg under the tongue every 5 (five) minutes as needed for chest pain.    [provider]  Nutritional Supplements (BRAIN SUPPORT) CAPS Take 1 capsule by mouth in the morning and at bedtime.    [provider]  pregabalin  (LYRICA ) 75 MG capsule TAKE 1 CAPSULE BY MOUTH THREE TIMES A DAY Patient taking differently: Take 75 mg by mouth 2 (two) times daily. 11/26/23   Georgina Ozell LABOR, MD  QUEtiapine  (SEROQUEL ) 300 MG tablet TAKE 0.5 TABLETS (150 MG TOTAL) BY MOUTH AT BEDTIME AS NEEDED.  03/02/24   Cottle, Lorene KANDICE Raddle., MD  rosuvastatin  (CRESTOR ) 20 MG tablet Take 20 mg by mouth daily. 05/01/23   [provider]  Turmeric 500 MG CAPS Take 500 mg by mouth 2 (two) times daily with a meal. extract    [provider]    Allergies: Amoxicillin, Clarithromycin, Levofloxacin, Atropine, and Amoxicillin-pot clavulanate    Review of Systems  Updated Vital Signs BP (!) 115/54   Pulse 77   Temp 97.7 F (36.5 C) (Oral)   Resp 20   Ht 5' 7 (1.702 m)   Wt 79.4 kg   SpO2 94%   BMI 27.41 kg/m   Physical Exam Vitals and nursing note reviewed.  Constitutional:      General: She is in acute distress.     Appearance: She is well-developed. She is ill-appearing.  HENT:     Head: Normocephalic and atraumatic.     Nose: Nose normal.     Mouth/Throat:     Mouth: Mucous membranes are moist.  Eyes:     Extraocular Movements: Extraocular movements intact.     Conjunctiva/sclera: Conjunctivae normal.     Pupils: Pupils are equal, round, and reactive to light.  Cardiovascular:     Rate and Rhythm: Normal rate and regular rhythm.     Pulses: Normal pulses.     Heart sounds: Normal heart sounds. No murmur heard. Pulmonary:     Effort: Pulmonary effort is normal. No respiratory distress.     Breath sounds: Normal breath sounds.  Abdominal:     Palpations: Abdomen is soft.     Tenderness: There is abdominal tenderness.  Musculoskeletal:        General: No swelling.     Cervical back: Normal range of motion and neck supple.  Skin:    General: Skin is warm and dry.     Capillary Refill: Capillary refill takes less than 2 seconds.  Neurological:     General: No focal deficit present.     Mental Status: She is alert and oriented to person, place, and time.     Cranial Nerves: No cranial nerve deficit.     Sensory: No sensory deficit.     Motor: No weakness.     Coordination: Coordination normal.  Psychiatric:        Mood and Affect: Mood normal.     (all labs  ordered are listed, but only abnormal results are displayed) Labs Reviewed  CBC WITH DIFFERENTIAL/PLATELET - Abnormal; Notable for the following components:      Result Value   RBC 3.72 (*)    HCT 35.5 (*)    All other components within normal limits  COMPREHENSIVE METABOLIC PANEL WITH GFR - Abnormal; Notable for the following components:   Glucose, Bld 113 (*)    Creatinine, Ser 1.45 (*)  GFR, Estimated 38 (*)    Anion gap 17 (*)    All other components within normal limits  LIPASE, BLOOD  URINALYSIS, ROUTINE W REFLEX MICROSCOPIC    EKG: None  Radiology: CT Head Wo Contrast Result Date: 05/28/2024 CLINICAL DATA:  Provided history: Headache, increasing frequency or severity. EXAM: CT HEAD WITHOUT CONTRAST TECHNIQUE: Contiguous axial images were obtained from the base of the skull through the vertex without intravenous contrast. RADIATION DOSE REDUCTION: This exam was performed according to the departmental dose-optimization program which includes automated exposure control, adjustment of the mA and/or kV according to patient size and/or use of iterative reconstruction technique. COMPARISON:  Head CT 01/30/2024. FINDINGS: Brain: Mild generalized cerebral atrophy. Patchy and ill-defined hypoattenuation within the cerebral white matter, nonspecific but compatible with mild chronic small vessel ischemic disease. There is no acute intracranial hemorrhage. No demarcated cortical infarct. No extra-axial fluid collection. No evidence of an intracranial mass. No midline shift. Vascular: No hyperdense vessel. Atherosclerotic calcifications. Skull: No calvarial fracture or aggressive osseous lesion. Sinuses/Orbits: No mass or acute finding within the imaged orbits. Postsurgical appearance of the paranasal sinuses. Severe left maxillary sinusitis. No more than trace mucosal thickening elsewhere in the paranasal sinuses. Other: Chronic nasal septal defect. IMPRESSION: 1.  No evidence of an acute  intracranial abnormality. 2. Mild chronic small vessel ischemic changes within the cerebral white matter. 3. Mild generalized cerebral atrophy. 4. Severe left maxillary sinusitis. 5. Chronic nasal septal defect. Electronically Signed   By: Rockey Childs D.O.   On: 05/28/2024 13:54   CT Renal Stone Study Result Date: 05/28/2024 CLINICAL DATA:  Dizziness and abdominal pain. EXAM: CT ABDOMEN AND PELVIS WITHOUT CONTRAST TECHNIQUE: Multidetector CT imaging of the abdomen and pelvis was performed following the standard protocol without IV contrast. RADIATION DOSE REDUCTION: This exam was performed according to the departmental dose-optimization program which includes automated exposure control, adjustment of the mA and/or kV according to patient size and/or use of iterative reconstruction technique. COMPARISON:  CTA abdomen pelvis 05/24/2024 FINDINGS: Lower chest: No acute abnormality. Hepatobiliary: No focal liver abnormality is seen. Stable hepatic steatosis. Status post cholecystectomy. No biliary dilatation. Pancreas: Unremarkable. No pancreatic ductal dilatation or surrounding inflammatory changes. Spleen: Normal in size without focal abnormality. Adrenals/Urinary Tract: Adrenal glands are unremarkable. Kidneys are normal, without renal calculi, focal lesion, or hydronephrosis. Bladder is unremarkable. Stomach/Bowel: No bowel obstruction. Some increased fluid in the proximal colon since the prior study is nonspecific but may be consistent with some degree of enteritis. No free intraperitoneal air. The appendix is not discretely visualized. Vascular/Lymphatic: Stable atherosclerosis of the abdominal aorta without aneurysm. No lymphadenopathy identified in the abdomen or pelvis. Reproductive: Uterus and bilateral adnexa are unremarkable. Other: Stable small umbilical hernia containing fat. No ascites or abscess. Musculoskeletal: Stable appearance status post prior lumbar fusion at L3-4 and L4-5. Stable severe  degenerative disc disease at L2-3. IMPRESSION: 1. Some increased fluid in the proximal colon since the prior study is nonspecific but may be consistent with some degree of enteritis. 2. Stable hepatic steatosis. 3. Stable small umbilical hernia containing fat. 4. Stable atherosclerosis of the abdominal aorta without aneurysm. 5. Stable appearance status post prior lumbar fusion at L3-4 and L4-5. Stable severe degenerative disc disease at L2-3. Electronically Signed   By: Marcey Moan M.D.   On: 05/28/2024 13:45     Procedures   Medications Ordered in the ED  ondansetron  (ZOFRAN ) injection 4 mg (has no administration in time range)  magnesium  sulfate IVPB  2 g 50 mL (0 g Intravenous Stopped 05/28/24 1352)  prochlorperazine  (COMPAZINE ) injection 10 mg (10 mg Intravenous Given 05/28/24 1308)  diphenhydrAMINE  (BENADRYL ) injection 12.5 mg (12.5 mg Intravenous Given 05/28/24 1307)  fentaNYL  (SUBLIMAZE ) injection 50 mcg (50 mcg Intravenous Given 05/28/24 1352)  dexamethasone  (DECADRON ) injection 10 mg (10 mg Intravenous Given 05/28/24 1352)  0.9 %  sodium chloride  infusion ( Intravenous New Bag/Given 05/28/24 1358)                                    Medical Decision Making Amount and/or Complexity of Data Reviewed Labs: ordered. Radiology: ordered.  Risk Prescription drug management. Decision regarding hospitalization.   Kristin Gilmore is here with migraines abdominal pain cramping and nausea inability to eat or drink.  Denies any specific diarrhea.  She just had some CT imaging done recently for her abdominal pain but pain is not well-controlled at this time.  It is making her migraine bad as well.  She has a history of bad migraines has not done well with medications in the past for this.  She has a history of reflux chronic constipation GI bleed in the past.History of Parkinson's hypertension high cholesterol as well.  Overall she looks uncomfortable.  But she is neurologically intact.   She is tender diffusely in her abdomen.  Per my review she had recent CTA of her abdomen and pelvis and contrasted CT that was unremarkable.  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  Does not seem like there was anything medically to do for that.  Today shows enteritis on her CT scan which I suspect could be playing a role in her increased pain today.  Head CT was normal.  Basic labs were unremarkable.  Have no concern for meningitis stroke or other neurologic process.  I think that her enteritis/abdominal pain is making her migraine bad.  She has been given multiple headache cocktails with Compazine  Benadryl  Zofran  and fentanyl  Decadron  magnesium  and she still has bad abdominal pain.  Headache has improved a little bit.  She does not feel comfortable going home.  She feels nauseous still.  Does not think she will be able to eat or drink.  Ultimately I think it be reasonable to observe her overnight give her pain support make sure she can tolerate p.o.  Patient hemodynamically stable throughout my care.  Will admit to hospitalist for further support for her enteritis which I think is likely either inflammatory or viral.  I do not think she needs any antibiotics at this time.  No fever no white count.  This chart was dictated using voice recognition software.  Despite best efforts to proofread,  errors can occur which can change the documentation meaning.      Final diagnoses:  Enteritis  Nausea    ED Discharge Orders     None          Ruthe Cornet, DO 05/28/24 1412    Ruthe Cornet, DO 05/28/24 1430

## 2024-05-28 NOTE — Plan of Care (Signed)
 Plan of Care Note for accepted transfer   Patient: Kristin Gilmore MRN: [id number]   DOA: 05/28/2024  Facility requesting transfer: [facility] Requesting Provider: Dr. Ruthe Reason for transfer: abd pain, nausea, dizzy Facility course:  Kristin Gilmore is a 73 yo female with PMH Parkinson, migraines, HTN, depression/anxiety, HLD who presented with nausea and abdominal pain.  She has felt ill for multiple days unable to tolerate adequate oral nutrition.  She has been followed by GI for similar and underwent recent CT angio A/P on 05/24/2024.  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  CT renal protocol performed which showed nonspecific fluid in the proximal colon possibly consistent with enteritis.  No other acute findings Due to her inability to tolerate adequate diet, she is admitted for pain control, nausea control, and IV fluids.  Plan of care: The patient is accepted for admission to Med-surg  unit, at [facility].  Author: Alm Apo, MD 05/28/2024 2:27 PM   **Nursing staff**, Please call [facility] Admits & Consults System-Wide number on Amion as soon as patient's arrival, so appropriate admitting provider can evaluate the pt.  Check [email or web address] for on-call coverage

## 2024-05-28 NOTE — ED Notes (Signed)
 [facility] here to transport patient to [facility].

## 2024-05-28 NOTE — Plan of Care (Signed)
  Problem: Clinical Measurements: Goal: Ability to maintain clinical measurements within normal limits will improve Outcome: Progressing Goal: Will remain free from infection Outcome: Progressing   Problem: Education: Goal: Knowledge of General Education information will improve Description: Including pain rating scale, medication(s)/side effects and non-pharmacologic comfort measures Outcome: Not Progressing   Problem: Health Behavior/Discharge Planning: Goal: Ability to manage health-related needs will improve Outcome: Not Progressing   

## 2024-05-28 NOTE — Progress Notes (Signed)
 Pt refusing IV fluids; Lavanda Horns NP notified. This RN educated the patient that the fluids are for her kidney injury and her creatinine levels are increasing. Pt stated that she will take them tomorrow I don't want them now

## 2024-05-28 NOTE — ED Notes (Signed)
 Patient assisted to bedside commode. Patient loud and rude at times. Politely I reminded her that is not how we talk to people. She apologies.

## 2024-05-28 NOTE — Assessment & Plan Note (Addendum)
 Afebrile without leukocytosis.  CTrenal stone study- with findings consistent with some degree of enteritis.  UA with small leukocytes, but she denies any urinary symptoms.  Afebrile.  WBC 5.9. -IV morphine  2 mg Q4hr prn -N/s 75cc/hr x 15hrs - Zofran  as needed - No indication for antibiotics at this time.

## 2024-05-28 NOTE — ED Notes (Signed)
 In the room because IV pump is alarming, patient reminded to keep her arm straight.

## 2024-05-28 NOTE — Progress Notes (Signed)
 PT arrived to unit. [facility] admitting paged.

## 2024-05-28 NOTE — H&P (Addendum)
 History and Physical    Kristin Gilmore FMW:[id number] DOB: 09/04/1951 DOA: 05/28/2024  PCP: [name], DO   Patient coming from: Home  I have personally briefly reviewed patient's old medical records in [facility]  Chief Complaint: Headache, abdominal pain  HPI: Kristin Gilmore is a 73 y.o. female with medical history significant for bipolar disorder, chronic venous insufficiency, Parkinson's disease, diabetes mellitus, hypertension. Patient presented to the ED with complaints of dizziness, abdominal pain and headache over the past several days.  She reports poor oral intake over the past several days, with nausea, but no vomiting or diarrhea.  No pain with urination no change in urinary habits.   Similar symptoms a month ago requiring hospitalization 7/5 to 7/10 with CT findings of colitis.  She had flex sig that showed few ulcers in the sigmoid colon-likely 2/2 chronic constipation, that were biopsied.  ED Course: Tmax 98.2.  Heart rate 65-79.  Respiratory rate 15-20.  Blood pressure systolic 115-158.  O2 sat greater 94% on room air. WBC 5.9.  Creatinine 1.45.  Normal lipase 19.  UA with small leukocytes. CT renal stone study-consistent with some degree of enteritis. Head CT -without acute intracranial abnormality. Patient was given Benadryl , Decadron  10 mg, Zofran , Compazine .  With failed p.o. challenge, patient was not comfortable going home as she lives alone, hence hospitalization requested.   Patient accepted in transfer from [facility] to [facility].  Review of Systems: As per HPI all other systems reviewed and negative.  Past Medical History:  Diagnosis Date   Anxiety    Arthritis    Complication of anesthesia    It didn't knock me out quick enough.   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   Pneumonia    Pre-diabetes    i was told i was pre-diabetic a while ago   Tremor 04/13/2014    Past Surgical History:  Procedure Laterality Date   CHOLECYSTECTOMY     COLONOSCOPY W/ POLYPECTOMY     FLEXIBLE SIGMOIDOSCOPY N/A 04/06/2024   Procedure: KINGSTON SIDE;  Surgeon: Rollin Dover, MD;  Location: [facility];  Service: Gastroenterology;  Laterality: N/A;   LUMBAR LAMINECTOMY  07/2020   3 level decompression - [place], [place]   LUMBAR LAMINECTOMY/DECOMPRESSION MICRODISCECTOMY N/A 12/19/2017   Procedure: LAMINECTOMY LUMBAR TWO- LUMBAR THREE, LUMBAR THREE- LUMBAR FOUR, LUMBAR FOUR- LUMBAR FIVE ;  Surgeon: [name], MD;  Location: [facility];  Service: Neurosurgery;  Laterality: N/A;   NASAL SINUS SURGERY     RADIOLOGY WITH ANESTHESIA N/A 05/30/2022   Procedure: MRI WITH ANESTHESIA LUMBAR SPINE W/WO CONSTRAST;  Surgeon: Radiologist, Medication, MD;  Location: [facility] OR;  Service: Radiology;  Laterality: N/A;   TEE WITHOUT CARDIOVERSION N/A 06/05/2022   Procedure: TRANSESOPHAGEAL ECHOCARDIOGRAM (TEE);  Surgeon: [name], MD;  Location: [facility];  Service: Cardiovascular;  Laterality: N/A;   TONSILLECTOMY     TOTAL KNEE ARTHROPLASTY Right 06/26/2021   Procedure: RIGHT TOTAL KNEE ARTHROPLASTY;  Surgeon: [name], MD;  Location: [facility] OR;  Service: Orthopedics;  Laterality: Right;   WISDOM TOOTH EXTRACTION       reports that she has never smoked. She has never used smokeless tobacco. She reports that she does not currently use alcohol. She reports that she does not use  drugs.  Allergies  Allergen Reactions   Amoxicillin Hives and Rash    UNSPECIFIED REACTION   Has patient had a PCN reaction causing immediate rash, facial/tongue/throat swelling, SOB or lightheadedness with hypotension: Unknown  Has patient had a PCN reaction causing severe rash involving mucus membranes or skin necrosis: Unknown  Has patient  had a PCN reaction that required hospitalization: Unknown  Has patient had a PCN reaction occurring within the last 10 years: No  If all of the above answers are NO, then may proceed with Cephalosporin use.  Sweats  Other Reaction(s): Unknown   Clarithromycin Hives, Other (See Comments) and Rash    Other Reaction(s): GI Intolerance, Other (See Comments), Unknown   Levofloxacin Other (See Comments)    Dizziness (intolerance)  Other Reaction(s): Dizziness  Dizziness (intolerance)  Other Reaction(s): Dizziness  levofloxacin  Dizziness (intolerance)  Other Reaction(s): Dizziness    Dizziness (intolerance)  Other Reaction(s): Dizziness   Atropine Hives and Rash   Amoxicillin-Pot Clavulanate Rash, Dermatitis and Hives    Other Reaction(s): GI Intolerance    Family History  Problem Relation Age of Onset   Cancer Father        stomach cancer   Migraines Mother    Prior to Admission medications   Medication Sig Start Date End Date Taking? Authorizing Provider  AIMOVIG  140 MG/ML SOAJ Inject 140 mg into the skin every 30 (thirty) days.    [provider]  amitriptyline  (ELAVIL ) 10 MG tablet Take 1 tablet (10 mg total) by mouth at bedtime as needed for sleep. Patient taking differently: Take 25 mg by mouth at bedtime. 06/08/22   [name], MD  aspirin  EC 81 MG tablet Take 81 mg by mouth daily. Swallow whole.    [provider]  Azelastine HCl 137 MCG/SPRAY SOLN Place 1 spray into both nostrils in the morning and at bedtime. AS DIRECTED 01/08/24   [provider]  bisacodyl  (DULCOLAX) 10 MG suppository Place 10 mg rectally as needed for moderate constipation.    [provider]  bisacodyl  (DULCOLAX) 5 MG EC tablet Take 5-15 mg by mouth at bedtime as needed (constipation).    [provider]  Black Cohosh 200 MG CAPS Take 200 mg by mouth daily.    [provider]  Calcium  Citrate-Vitamin D  (CALCIUM  CITRATE + D PO) Take  1-2 capsules by mouth See admin instructions. Calcium  200 mg, vitamin D3 2.5 mcg - 100 units - take 1 tablet by mouth with breakfast and 2 tablets with supper    [provider]  carbidopa -levodopa  (SINEMET  IR) 25-100 MG tablet Take 1 tablet by mouth 3 (three) times daily. 12/25/22   [provider]  carvedilol  (COREG ) 6.25 MG tablet Take 1 tablet (6.25 mg total) by mouth 2 (two) times daily. 04/08/24 07/07/24  Arlice Reichert, MD  Cholecalciferol  (VITAMIN D3) 50 MCG (2000 UT) capsule Take 2,000 Units by mouth daily.    [provider]  Coenzyme Q10 (COQ10) 100 MG CAPS Take 100 mg by mouth daily.    [provider]  divalproex  (DEPAKOTE  ER) 250 MG 24 hr tablet Take 1 tablet (250 mg total) by mouth 2 (two) times daily. Patient taking differently: Take 250 mg by mouth daily. 01/19/24   Cottle, Lorene KANDICE Mickey., MD  docusate sodium  (COLACE) 100 MG capsule Take 300 mg by mouth at bedtime as needed for moderate constipation.    [provider]  HYDROcodone -acetaminophen  (NORCO/VICODIN) 5-325 MG tablet Take 1 tablet by  mouth every 6 (six) hours as needed for severe pain (pain score 7-10). Patient not taking: Reported on 03/19/2024 01/29/24   Geiple, Joshua, PA-C  levocetirizine (XYZAL) 5 MG tablet Take 5 mg by mouth every evening.    [provider]  Menthol , Topical Analgesic, (BIOFREEZE) 4 % GEL Apply 1 application  topically 3 (three) times daily as needed (pain).    [provider]  metoCLOPramide  (REGLAN ) 10 MG tablet Take 1 tablet (10 mg total) by mouth every 6 (six) hours. 01/29/24   Geiple, Joshua, PA-C  nitroGLYCERIN  (NITROSTAT ) 0.4 MG SL tablet Place 0.4 mg under the tongue every 5 (five) minutes as needed for chest pain.    [provider]  Nutritional Supplements (BRAIN SUPPORT) CAPS Take 1 capsule by mouth in the morning and at bedtime.    [provider]  pregabalin  (LYRICA ) 75 MG capsule TAKE 1 CAPSULE BY MOUTH THREE TIMES A  DAY Patient taking differently: Take 75 mg by mouth 2 (two) times daily. 11/26/23   Georgina Ozell LABOR, MD  QUEtiapine  (SEROQUEL ) 300 MG tablet TAKE 0.5 TABLETS (150 MG TOTAL) BY MOUTH AT BEDTIME AS NEEDED. 03/02/24   Cottle, Lorene KANDICE Raddle., MD  rosuvastatin  (CRESTOR ) 20 MG tablet Take 20 mg by mouth daily. 05/01/23   [provider]  Turmeric 500 MG CAPS Take 500 mg by mouth 2 (two) times daily with a meal. extract    [provider]    Physical Exam: Vitals:   05/28/24 1600 05/28/24 1645 05/28/24 1656 05/28/24 1800  BP:   (!) 115/54 (!) 158/89  Pulse: 65 73 74 79  Resp: 17 15 16 18   Temp:   (!) 97.5 F (36.4 C) 98.2 F (36.8 C)  TempSrc:   Oral   SpO2: 95% 96% 99% 98%  Weight:      Height:        Constitutional: NAD, calm, comfortable Vitals:   05/28/24 1600 05/28/24 1645 05/28/24 1656 05/28/24 1800  BP:   (!) 115/54 (!) 158/89  Pulse: 65 73 74 79  Resp: 17 15 16 18   Temp:   (!) 97.5 F (36.4 C) 98.2 F (36.8 C)  TempSrc:   Oral   SpO2: 95% 96% 99% 98%  Weight:      Height:       Eyes: PERRL, lids and conjunctivae normal ENMT: Mucous membranes are moist.  Neck: normal, supple, no masses, no thyromegaly Respiratory: clear to auscultation bilaterally, no wheezing, no crackles. Normal respiratory effort. No accessory muscle use.  Cardiovascular: Regular rate and rhythm, no murmurs / rubs / gallops. No extremity edema.  Extremities warm. Abdomen: no tenderness, no masses palpated. No hepatosplenomegaly. Bowel sounds positive.  Musculoskeletal: no clubbing / cyanosis. No joint deformity upper and lower extremities.  Skin: no rashes, lesions, ulcers. No induration Neurologic: No facial asymmetry, moving extremities spontaneously, speech fluent.  Has significant resting tremors from Parkinson's Psychiatric: Normal judgment and insight. Alert and oriented x 3. Normal mood.   Labs on Admission: I have personally reviewed following labs and imaging  studies  CBC: Recent Labs  Lab 05/28/24 1306  WBC 5.9  NEUTROABS 4.2  HGB 12.0  HCT 35.5*  MCV 95.4  PLT 229   Basic Metabolic Panel: Recent Labs  Lab 05/24/24 1455 05/28/24 1306  NA  --  139  K  --  3.8  CL  --  98  CO2  --  24  GLUCOSE  --  113*  BUN  --  21  CREATININE 1.40* 1.45*  CALCIUM   --  9.9   GFR: Estimated Creatinine Clearance: 36.9 mL/min (A) (by C-G formula based on SCr of 1.45 mg/dL (H)). Liver Function Tests: Recent Labs  Lab 05/28/24 1306  AST 17  ALT 5  ALKPHOS 126  BILITOT 0.3  PROT 6.7  ALBUMIN  4.5   Recent Labs  Lab 05/28/24 1306  LIPASE 19   Urine analysis:    Component Value Date/Time   COLORURINE YELLOW 05/28/2024 1453   APPEARANCEUR HAZY (A) 05/28/2024 1453   LABSPEC 1.036 (H) 05/28/2024 1453   PHURINE 5.5 05/28/2024 1453   GLUCOSEU >1,000 (A) 05/28/2024 1453   HGBUR NEGATIVE 05/28/2024 1453   BILIRUBINUR NEGATIVE 05/28/2024 1453   KETONESUR NEGATIVE 05/28/2024 1453   PROTEINUR TRACE (A) 05/28/2024 1453   UROBILINOGEN 0.2 05/31/2015 1720   NITRITE NEGATIVE 05/28/2024 1453   LEUKOCYTESUR SMALL (A) 05/28/2024 1453    Radiological Exams on Admission: CT Head Wo Contrast Result Date: 05/28/2024 CLINICAL DATA:  Provided history: Headache, increasing frequency or severity. EXAM: CT HEAD WITHOUT CONTRAST TECHNIQUE: Contiguous axial images were obtained from the base of the skull through the vertex without intravenous contrast. RADIATION DOSE REDUCTION: This exam was performed according to the departmental dose-optimization program which includes automated exposure control, adjustment of the mA and/or kV according to patient size and/or use of iterative reconstruction technique. COMPARISON:  Head CT 01/30/2024. FINDINGS: Brain: Mild generalized cerebral atrophy. Patchy and ill-defined hypoattenuation within the cerebral white matter, nonspecific but compatible with mild chronic small vessel ischemic disease. There is no acute  intracranial hemorrhage. No demarcated cortical infarct. No extra-axial fluid collection. No evidence of an intracranial mass. No midline shift. Vascular: No hyperdense vessel. Atherosclerotic calcifications. Skull: No calvarial fracture or aggressive osseous lesion. Sinuses/Orbits: No mass or acute finding within the imaged orbits. Postsurgical appearance of the paranasal sinuses. Severe left maxillary sinusitis. No more than trace mucosal thickening elsewhere in the paranasal sinuses. Other: Chronic nasal septal defect. IMPRESSION: 1.  No evidence of an acute intracranial abnormality. 2. Mild chronic small vessel ischemic changes within the cerebral white matter. 3. Mild generalized cerebral atrophy. 4. Severe left maxillary sinusitis. 5. Chronic nasal septal defect. Electronically Signed   By: Rockey Childs D.O.   On: 05/28/2024 13:54   CT Renal Stone Study Result Date: 05/28/2024 CLINICAL DATA:  Dizziness and abdominal pain. EXAM: CT ABDOMEN AND PELVIS WITHOUT CONTRAST TECHNIQUE: Multidetector CT imaging of the abdomen and pelvis was performed following the standard protocol without IV contrast. RADIATION DOSE REDUCTION: This exam was performed according to the departmental dose-optimization program which includes automated exposure control, adjustment of the mA and/or kV according to patient size and/or use of iterative reconstruction technique. COMPARISON:  CTA abdomen pelvis 05/24/2024 FINDINGS: Lower chest: No acute abnormality. Hepatobiliary: No focal liver abnormality is seen. Stable hepatic steatosis. Status post cholecystectomy. No biliary dilatation. Pancreas: Unremarkable. No pancreatic ductal dilatation or surrounding inflammatory changes. Spleen: Normal in size without focal abnormality. Adrenals/Urinary Tract: Adrenal glands are unremarkable. Kidneys are normal, without renal calculi, focal lesion, or hydronephrosis. Bladder is unremarkable. Stomach/Bowel: No bowel obstruction. Some increased  fluid in the proximal colon since the prior study is nonspecific but may be consistent with some degree of enteritis. No free intraperitoneal air. The appendix is not discretely visualized. Vascular/Lymphatic: Stable atherosclerosis of the abdominal aorta without aneurysm. No lymphadenopathy identified in the abdomen or pelvis. Reproductive: Uterus and bilateral adnexa are unremarkable. Other: Stable small umbilical hernia containing fat. No ascites  or abscess. Musculoskeletal: Stable appearance status post prior lumbar fusion at L3-4 and L4-5. Stable severe degenerative disc disease at L2-3. IMPRESSION: 1. Some increased fluid in the proximal colon since the prior study is nonspecific but may be consistent with some degree of enteritis. 2. Stable hepatic steatosis. 3. Stable small umbilical hernia containing fat. 4. Stable atherosclerosis of the abdominal aorta without aneurysm. 5. Stable appearance status post prior lumbar fusion at L3-4 and L4-5. Stable severe degenerative disc disease at L2-3. Electronically Signed   By: Marcey Moan M.D.   On: 05/28/2024 13:45   EKG: Independently reviewed.  Sinus rhythm, rate 63, QTc 443.  No significant change from prior.  Assessment/Plan Principal Problem:   Abdominal pain Active Problems:   Parkinson's disease (HCC)   Bipolar disorder, in partial remission, most recent episode hypomanic (HCC)   Essential hypertension   Chronic pain syndrome   Type 2 diabetes mellitus with diabetic mononeuropathy, without long-term current use of insulin  (HCC)  Assessment and Plan: * Abdominal pain Afebrile without leukocytosis.  CTrenal stone study- with findings consistent with some degree of enteritis.  UA with small leukocytes, but she denies any urinary symptoms.  Afebrile.  WBC 5.9. -IV morphine  2 mg Q4hr prn -N/s 75cc/hr x 15hrs - Zofran  as needed - No indication for antibiotics at this time.  Migraines headache-head CT without acute abnormality.  Improved  with cocktail in the ED including steroids.   Type 2 diabetes mellitus with diabetic mononeuropathy, without long-term current use of insulin  (HCC) Diet controlled. - HgbA1c  Essential hypertension Stable. -Resume carvedilol   Bipolar disorder, in partial remission, most recent episode hypomanic (HCC) - Resume Depakote , Elavil    Parkinson's disease (HCC) Significant resting tremors on exam. -Resume Sinemet    DVT prophylaxis: Lovenox  Code Status: FULL Family Communication: None at bedside Disposition Plan: ~ 2 days Consults called: None Admission status: Obs med surg    Author: Tully FORBES Carwin, MD 05/28/2024 9:02 PM  For on call review [email or web address].

## 2024-05-28 NOTE — Assessment & Plan Note (Addendum)
-   Resume Depakote , Elavil

## 2024-05-28 NOTE — Assessment & Plan Note (Signed)
 Stable. -Resume carvedilol

## 2024-05-29 DIAGNOSIS — R1084 Generalized abdominal pain: Secondary | ICD-10-CM | POA: Diagnosis not present

## 2024-05-29 LAB — BASIC METABOLIC PANEL WITH GFR
Anion gap: 15 (ref 5–15)
BUN: 19 mg/dL (ref 8–23)
CO2: 25 mmol/L (ref 22–32)
Calcium: 9.5 mg/dL (ref 8.9–10.3)
Chloride: 99 mmol/L (ref 98–111)
Creatinine, Ser: 1.08 mg/dL — ABNORMAL HIGH (ref 0.44–1.00)
GFR, Estimated: 54 mL/min — ABNORMAL LOW (ref >60)
Glucose, Bld: 97 mg/dL (ref 70–99)
Potassium: 4.2 mmol/L (ref 3.5–5.1)
Sodium: 139 mmol/L (ref 135–145)

## 2024-05-29 LAB — CBC
HCT: 39.6 % (ref 36.0–46.0)
Hemoglobin: 12.4 g/dL (ref 12.0–15.0)
MCH: 30.4 pg (ref 26.0–34.0)
MCHC: 31.3 g/dL (ref 30.0–36.0)
MCV: 97.1 fL (ref 80.0–100.0)
Platelets: 247 K/uL (ref 150–400)
RBC: 4.08 MIL/uL (ref 3.87–5.11)
RDW: 13.2 % (ref 11.5–15.5)
WBC: 7.2 K/uL (ref 4.0–10.5)
nRBC: 0 % (ref 0.0–0.2)

## 2024-05-29 MED ORDER — HYOSCYAMINE SULFATE 0.125 MG PO TABS
0.1250 mg | ORAL_TABLET | Freq: Three times a day (TID) | ORAL | Status: DC | PRN
Start: 2024-05-29 — End: 2024-05-29

## 2024-05-29 MED ORDER — ROSUVASTATIN CALCIUM 10 MG PO TABS
20.0000 mg | ORAL_TABLET | Freq: Every day | ORAL | Status: DC
  Administered 2024-05-29 – 2024-05-30 (×2): 20 mg via ORAL
  Filled 2024-05-29 (×2): qty 2

## 2024-05-29 MED ORDER — DICYCLOMINE HCL 10 MG PO CAPS: 10.0000 mg | ORAL_CAPSULE | Freq: Three times a day (TID) | ORAL | Status: DC | PRN

## 2024-05-29 MED ORDER — LORATADINE 10 MG PO TABS
10.0000 mg | ORAL_TABLET | Freq: Every evening | ORAL | Status: DC
Start: 2024-05-29 — End: 2024-05-30
  Administered 2024-05-29: 10 mg via ORAL
  Filled 2024-05-29: qty 1

## 2024-05-29 MED ORDER — IRBESARTAN 150 MG PO TABS
300.0000 mg | ORAL_TABLET | Freq: Every day | ORAL | Status: DC
  Administered 2024-05-29 – 2024-05-30 (×2): 300 mg via ORAL
  Filled 2024-05-29 (×2): qty 2

## 2024-05-29 MED ORDER — NITROGLYCERIN 0.4 MG SL SUBL: 0.4000 mg | SUBLINGUAL_TABLET | SUBLINGUAL | Status: DC | PRN

## 2024-05-29 MED ORDER — FLUTICASONE PROPIONATE 50 MCG/ACT NA SUSP
1.0000 | Freq: Every day | NASAL | Status: DC
  Administered 2024-05-30: 1 via NASAL
  Filled 2024-05-29: qty 16

## 2024-05-29 MED ORDER — POLYETHYLENE GLYCOL 3350 17 G PO PACK
17.0000 g | PACK | Freq: Every day | ORAL | Status: DC
  Administered 2024-05-30: 17 g via ORAL
  Filled 2024-05-29: qty 1

## 2024-05-29 MED ORDER — ASPIRIN 81 MG PO TBEC
81.0000 mg | DELAYED_RELEASE_TABLET | Freq: Every day | ORAL | Status: DC
  Administered 2024-05-30: 81 mg via ORAL
  Filled 2024-05-29 (×2): qty 1

## 2024-05-29 MED ORDER — AZELASTINE HCL 0.1 % NA SOLN
1.0000 | Freq: Two times a day (BID) | NASAL | Status: DC
  Administered 2024-05-29 – 2024-05-30 (×2): 1 via NASAL
  Filled 2024-05-29: qty 30

## 2024-05-29 MED ORDER — QUETIAPINE FUMARATE 100 MG PO TABS
100.0000 mg | ORAL_TABLET | Freq: Every day | ORAL | Status: DC
  Administered 2024-05-29: 100 mg via ORAL
  Filled 2024-05-29: qty 1

## 2024-05-29 MED ORDER — BUPRENORPHINE 20 MCG/HR TD PTWK: 1.0000 | MEDICATED_PATCH | TRANSDERMAL | Status: DC

## 2024-05-29 MED ORDER — BUTALBITAL-APAP-CAFFEINE 50-325-40 MG PO TABS
1.0000 | ORAL_TABLET | Freq: Two times a day (BID) | ORAL | Status: DC | PRN
  Administered 2024-05-30: 1 via ORAL
  Filled 2024-05-29: qty 1

## 2024-05-29 MED ORDER — DOCUSATE SODIUM 100 MG PO CAPS
100.0000 mg | ORAL_CAPSULE | Freq: Two times a day (BID) | ORAL | Status: DC
  Administered 2024-05-29 – 2024-05-30 (×3): 100 mg via ORAL
  Filled 2024-05-29 (×3): qty 1

## 2024-05-29 MED ORDER — BISACODYL 10 MG RE SUPP
10.0000 mg | Freq: Once | RECTAL | Status: AC
  Administered 2024-05-29: 10 mg via RECTAL
  Filled 2024-05-29: qty 1

## 2024-05-29 MED ORDER — AMLODIPINE BESYLATE 5 MG PO TABS
5.0000 mg | ORAL_TABLET | Freq: Every day | ORAL | Status: DC
  Administered 2024-05-29 – 2024-05-30 (×2): 5 mg via ORAL
  Filled 2024-05-29 (×2): qty 1

## 2024-05-29 MED ORDER — BISACODYL 5 MG PO TBEC: 10.0000 mg | DELAYED_RELEASE_TABLET | Freq: Every day | ORAL | Status: DC | PRN

## 2024-05-29 NOTE — Plan of Care (Signed)

## 2024-05-29 NOTE — Care Management Obs Status (Signed)
 MEDICARE OBSERVATION STATUS NOTIFICATION   Patient Details  Name: Kristin Gilmore MRN: [id number] Date of Birth: 01-14-1951   Medicare Observation Status Notification Given:  Yes    Duwaine GORMAN Aran, LCSW 05/29/2024, 3:44 PM

## 2024-05-29 NOTE — Progress Notes (Signed)
 PROGRESS NOTE  Kristin Gilmore FMW:[id number] DOB: 03/03/1951 DOA: 05/28/2024 PCP: [name], DO   LOS: 0 days   Brief narrative:   Kristin Gilmore is a 73 y.o. female with medical history significant for bipolar disorder, chronic venous insufficiency, Parkinson's disease, diabetes mellitus, hypertension presented to the hospital with dizziness abdominal pain and headache for the last several days with poor oral intake nausea.  Had similar hospitalization in July and had CT findings of colitis.  Flex sigmoidoscopy at that time showed few ulcers in the sigmoid colon.  On this presentation patient had stable vitals.  Labs were notable for creatinine of 1.4.  Urinalysis with small leukocytes.  CT renal stone study consistent with some degree of enteritis.  CT head scan was negative for acute findings.  Patient was then considered for admission to the hospital for further evaluation and treatment.    Assessment/Plan: Principal Problem:   Abdominal pain Active Problems:   Parkinson's disease (HCC)   Bipolar disorder, in partial remission, most recent episode hypomanic (HCC)   Essential hypertension   Chronic pain syndrome   Type 2 diabetes mellitus with diabetic mononeuropathy, without long-term current use of insulin  (HCC)   Abdominal pain Afebrile without leukocytosis.  CT renal stone study consistent with  enteritis.  UA with small leukocytes, but she denies any urinary symptoms.  Afebrile.  WBC 5.9.  Received IV fluids.  Encourage oral hydration at this time.  Abdominal pain has slightly improved but Has not had bowel movement.   Migraines headache Still complains of intractable headache.  CT head scan without acute abnormality.  Improved with cocktail in the ED including steroids.  Patient states that she follows up with neurology as outpatient for headache.  Continue Elavil  Depakote .    Type 2 diabetes mellitus with diabetic mononeuropathy, without long-term current use of  insulin  Diet controlled.  On Jardiance at home.  Check hemoglobin A1c.  Does not wish to be on diabetic diet.  Essential hypertension Continue Coreg , amlodipine  and telmisartan.  Blood pressure is elevated.   Bipolar disorder, in partial remission, most recent episode hypomanic Continue Depakote , Elavil     Parkinson's disease Continue Sinemet .  Has ongoing tremors.  Will get PT evaluation.  Constipation for last 3 to 4 days.  Subjectively suppository without any bowel movement.  Continue MiraLAX  and Colace.   Will resume Dulcolax suppository and oral Dulcolax from home.  Debility weakness lack of ambulation for the last 3 to 4 days.  Will get PT evaluation.  Lives by herself at home and has a walker for ambulation.  DVT prophylaxis: enoxaparin  (LOVENOX ) injection 40 mg Start: 05/28/24 2200   Disposition: Home likely 05/30/2024.  Patient is very scared about her living by herself at home and I offered the possibility of assisted living facility.  She would like to think about it.  Status is: Observation  The patient will require care spanning > 2 midnights and should be moved to inpatient because: Pending clinical improvement, continued headache, need for PT evaluation,    Code Status:     Code Status: Full Code  Family Communication: None at bedside  Consultants: None  Procedures: None  Anti-infectives:  None  Anti-infectives (From admission, onward)    None        Subjective: Today, patient was seen and examined at bedside.  Patient continues to complain of severe headache, difficulty ambulating, constipation for the last 3 to 4 days and not feeling well.  Feels scared about going home  and being by herself alone.  Objective: Vitals:   05/29/24 1019 05/29/24 1205  BP: (!) 155/65 (!) 166/138  Pulse: 70 70  Resp:  16  Temp:  97.7 F (36.5 C)  SpO2:  97%    Intake/Output Summary (Last 24 hours) at 05/29/2024 1516 Last data filed at 05/29/2024 1100 Gross per  24 hour  Intake 268 ml  Output --  Net 268 ml   Filed Weights   05/28/24 1241  Weight: 79.4 kg   Body mass index is 27.41 kg/m.   Physical Exam: GENERAL: Patient is alert awake and oriented. Not in obvious distress.  Anxious, tremulous, HENT: No scleral pallor or icterus. Pupils equally reactive to light. Oral mucosa is moist NECK: is supple, no gross swelling noted. CHEST: Clear to auscultation. No crackles or wheezes.  Diminished breath sounds bilaterally. CVS: S1 and S2 heard, no murmur. Regular rate and rhythm.  ABDOMEN: Soft, non-tender, bowel sounds are present. EXTREMITIES: No edema. CNS: Cranial nerves are intact. No focal motor deficits.  Resting tremors noted from Parkinson's disease, SKIN: warm and dry without rashes.  Data Review: I have personally reviewed the following laboratory data and studies,  CBC: Recent Labs  Lab 05/28/24 1306 05/29/24 0708  WBC 5.9 7.2  NEUTROABS 4.2  --   HGB 12.0 12.4  HCT 35.5* 39.6  MCV 95.4 97.1  PLT 229 247   Basic Metabolic Panel: Recent Labs  Lab 05/24/24 1455 05/28/24 1306 05/29/24 0708  NA  --  139 139  K  --  3.8 4.2  CL  --  98 99  CO2  --  24 25  GLUCOSE  --  113* 97  BUN  --  21 19  CREATININE 1.40* 1.45* 1.08*  CALCIUM   --  9.9 9.5   Liver Function Tests: Recent Labs  Lab 05/28/24 1306  AST 17  ALT 5  ALKPHOS 126  BILITOT 0.3  PROT 6.7  ALBUMIN  4.5   Recent Labs  Lab 05/28/24 1306  LIPASE 19   No results for input(s): AMMONIA in the last 168 hours. Cardiac Enzymes: No results for input(s): CKTOTAL, CKMB, CKMBINDEX, TROPONINI in the last 168 hours. BNP (last 3 results) No results for input(s): BNP in the last 8760 hours.  ProBNP (last 3 results) No results for input(s): PROBNP in the last 8760 hours.  CBG: No results for input(s): GLUCAP in the last 168 hours. No results found for this or any previous visit (from the past 240 hours).   Studies: CT Head Wo  Contrast Result Date: 05/28/2024 CLINICAL DATA:  Provided history: Headache, increasing frequency or severity. EXAM: CT HEAD WITHOUT CONTRAST TECHNIQUE: Contiguous axial images were obtained from the base of the skull through the vertex without intravenous contrast. RADIATION DOSE REDUCTION: This exam was performed according to the departmental dose-optimization program which includes automated exposure control, adjustment of the mA and/or kV according to patient size and/or use of iterative reconstruction technique. COMPARISON:  Head CT 01/30/2024. FINDINGS: Brain: Mild generalized cerebral atrophy. Patchy and ill-defined hypoattenuation within the cerebral white matter, nonspecific but compatible with mild chronic small vessel ischemic disease. There is no acute intracranial hemorrhage. No demarcated cortical infarct. No extra-axial fluid collection. No evidence of an intracranial mass. No midline shift. Vascular: No hyperdense vessel. Atherosclerotic calcifications. Skull: No calvarial fracture or aggressive osseous lesion. Sinuses/Orbits: No mass or acute finding within the imaged orbits. Postsurgical appearance of the paranasal sinuses. Severe left maxillary sinusitis. No more than trace mucosal thickening  elsewhere in the paranasal sinuses. Other: Chronic nasal septal defect. IMPRESSION: 1.  No evidence of an acute intracranial abnormality. 2. Mild chronic small vessel ischemic changes within the cerebral white matter. 3. Mild generalized cerebral atrophy. 4. Severe left maxillary sinusitis. 5. Chronic nasal septal defect. Electronically Signed   By: Rockey Childs D.O.   On: 05/28/2024 13:54   CT Renal Stone Study Result Date: 05/28/2024 CLINICAL DATA:  Dizziness and abdominal pain. EXAM: CT ABDOMEN AND PELVIS WITHOUT CONTRAST TECHNIQUE: Multidetector CT imaging of the abdomen and pelvis was performed following the standard protocol without IV contrast. RADIATION DOSE REDUCTION: This exam was performed  according to the departmental dose-optimization program which includes automated exposure control, adjustment of the mA and/or kV according to patient size and/or use of iterative reconstruction technique. COMPARISON:  CTA abdomen pelvis 05/24/2024 FINDINGS: Lower chest: No acute abnormality. Hepatobiliary: No focal liver abnormality is seen. Stable hepatic steatosis. Status post cholecystectomy. No biliary dilatation. Pancreas: Unremarkable. No pancreatic ductal dilatation or surrounding inflammatory changes. Spleen: Normal in size without focal abnormality. Adrenals/Urinary Tract: Adrenal glands are unremarkable. Kidneys are normal, without renal calculi, focal lesion, or hydronephrosis. Bladder is unremarkable. Stomach/Bowel: No bowel obstruction. Some increased fluid in the proximal colon since the prior study is nonspecific but may be consistent with some degree of enteritis. No free intraperitoneal air. The appendix is not discretely visualized. Vascular/Lymphatic: Stable atherosclerosis of the abdominal aorta without aneurysm. No lymphadenopathy identified in the abdomen or pelvis. Reproductive: Uterus and bilateral adnexa are unremarkable. Other: Stable small umbilical hernia containing fat. No ascites or abscess. Musculoskeletal: Stable appearance status post prior lumbar fusion at L3-4 and L4-5. Stable severe degenerative disc disease at L2-3. IMPRESSION: 1. Some increased fluid in the proximal colon since the prior study is nonspecific but may be consistent with some degree of enteritis. 2. Stable hepatic steatosis. 3. Stable small umbilical hernia containing fat. 4. Stable atherosclerosis of the abdominal aorta without aneurysm. 5. Stable appearance status post prior lumbar fusion at L3-4 and L4-5. Stable severe degenerative disc disease at L2-3. Electronically Signed   By: Marcey Moan M.D.   On: 05/28/2024 13:45      Vernal Alstrom, MD  [facility] 05/29/2024  If 7PM-7AM, please  contact night-coverage

## 2024-05-29 NOTE — Progress Notes (Addendum)
 PT Cancellation Note  Patient Details Name: Kristin Gilmore MRN: [id number] DOB: 05/23/1951   Cancelled Treatment:    Reason Eval/Treat Not Completed: Other (comment) Checked at 1245 and pt declined reporting just got enema or suppository.  Checked 1405 pt in bathroom and needing more time.  Will f/u as able.   Addendum: checked again 1550 and pt declined due to not feeling well/stomach upset. Will f/u later date.    Rekha Hobbins, PT Acute Rehab [facility] [phone]   [name] 05/29/2024, 2:06 PM

## 2024-05-29 NOTE — Hospital Course (Addendum)
 Kristin Gilmore is a 73 y.o. female with medical history significant for bipolar disorder, chronic venous insufficiency, Parkinson's disease, diabetes mellitus, hypertension presented to the hospital with dizziness abdominal pain and headache for the last several days with poor oral intake nausea.  Had similar hospitalization in July and had CT findings of colitis.  Flex sigmoidoscopy at that time showed few ulcers in the sigmoid colon.  On this presentation patient had stable vitals.  Labs were notable for creatinine of 1.4.  Urinalysis with small leukocytes.  CT renal stone study consistent with some degree of enteritis.  CT head scan was negative for acute findings.   Abdominal pain Afebrile without leukocytosis.  CT renal stone study consistent with  enteritis.  UA with small leukocytes, but she denies any urinary symptoms.  Afebrile.  WBC 5.9.  Continue IV fluids antiemetics.   Migraines headache  CT head scan without acute abnormality.  Improved with cocktail in the ED including steroids.  Patient states that she is supposed to follow-up with neurology as outpatient.     Type 2 diabetes mellitus with diabetic mononeuropathy, without long-term current use of insulin  Diet controlled.  Check hemoglobin A1c.  Does not wish to be on diabetic diet.  Essential hypertension Continue Coreg    Bipolar disorder, in partial remission, most recent episode hypomanic Continue Depakote , Elavil      Parkinson's disease Continue Sinemet 

## 2024-05-30 DIAGNOSIS — R1084 Generalized abdominal pain: Secondary | ICD-10-CM | POA: Diagnosis not present

## 2024-05-30 LAB — HEMOGLOBIN A1C
Hgb A1c MFr Bld: 6.3 % — ABNORMAL HIGH (ref 4.8–5.6)
Mean Plasma Glucose: 134 mg/dL

## 2024-05-30 MED ORDER — POLYETHYLENE GLYCOL 3350 17 G PO PACK: 17.0000 g | PACK | Freq: Every day | ORAL | 0 refills | Status: AC | PRN

## 2024-05-30 NOTE — Plan of Care (Signed)
?  Problem: Education: ?Goal: Knowledge of General Education information will improve ?Description: Including pain rating scale, medication(s)/side effects and non-pharmacologic comfort measures ?Outcome: Progressing ?  ?Problem: Activity: ?Goal: Risk for activity intolerance will decrease ?Outcome: Progressing ?  ?Problem: Coping: ?Goal: Level of anxiety will decrease ?Outcome: Progressing ?  ?Problem: Elimination: ?Goal: Will not experience complications related to bowel motility ?Outcome: Progressing ?Goal: Will not experience complications related to urinary retention ?Outcome: Progressing ?  ?Problem: Safety: ?Goal: Ability to remain free from injury will improve ?Outcome: Progressing ?  ?

## 2024-05-30 NOTE — Plan of Care (Signed)
  Problem: Education: Goal: Knowledge of General Education information will improve Description: Including pain rating scale, medication(s)/side effects and non-pharmacologic comfort measures Outcome: Progressing   Problem: Clinical Measurements: Goal: Diagnostic test results will improve Outcome: Progressing   Problem: Pain Managment: Goal: General experience of comfort will improve and/or be controlled Outcome: Progressing   Problem: Safety: Goal: Ability to remain free from injury will improve Outcome: Progressing

## 2024-05-30 NOTE — Progress Notes (Signed)
 The patient has met all goal per MD orders and is ready for discharge. Patient in agreement, being discharged home to self under care of physician. Discharge summary paperwork give, explained and reviewed. Patient left with all belongings in stable conditions.

## 2024-05-30 NOTE — Discharge Summary (Signed)
 Physician Discharge Summary  Sawsan Riggio Legaspi FMW:[id number] DOB: 05-21-1951 DOA: 05/28/2024  PCP: [name], DO  Admit date: 05/28/2024 Discharge date: 05/30/2024  Admitted From: Home  Discharge disposition: Home   Recommendations for Outpatient Follow-Up:   Follow up with your primary care provider in one week.  Check CBC, BMP, magnesium  in the next visit Follow-up with your neurologist for headache in 1 to 2 weeks.   Discharge Diagnosis:   Principal Problem:   Abdominal pain Active Problems:   Parkinson's disease (HCC)   Bipolar disorder, in partial remission, most recent episode hypomanic (HCC)   Essential hypertension   Chronic pain syndrome   Type 2 diabetes mellitus with diabetic mononeuropathy, without long-term current use of insulin  [facility])   Discharge Condition: Improved.  Diet recommendation: Regular  Wound care: None.  Code status: Full.   History of Present Illness:    Kristin Gilmore is a 73 y.o. female with medical history significant for bipolar disorder, chronic venous insufficiency, Parkinson's disease, diabetes mellitus, hypertension presented to the hospital with dizziness abdominal pain and headache for the last several days with poor oral intake nausea.  Had similar hospitalization in July and had CT findings of colitis.  Flex sigmoidoscopy at that time showed few ulcers in the sigmoid colon.  On this presentation patient had stable vitals.  Labs were notable for creatinine of 1.4.  Urinalysis with small leukocytes.  CT renal stone study consistent with some degree of enteritis.  CT head scan was negative for acute findings.  Patient was then considered for admission to the hospital for further evaluation and treatment.    Hospital Course:   Following conditions were addressed during hospitalization as listed below,   Abdominal pain Very nonspecific.  Afebrile without leukocytosis.  CT renal stone study consistent with  enteritis.   Clinically no symptoms.  UA with small leukocytes, but she denies any urinary symptoms.  Afebrile.  WBC 5.9.  Received IV fluids.  Has had good bowel movements.  No abdominal issues prior to discharge.    Migraines headache .  CT head scan without acute abnormality.  Improved with cocktail in the ED including steroids.  Patient states that she follows up with neurology as outpatient for headache.  Continue Elavil  Depakote .  Encouraged her to follow-up with neurology for definitive management.    Type 2 diabetes mellitus with diabetic mononeuropathy, without long-term current use of insulin  Diet controlled.  On Jardiance at home.  Hemoglobin A1c of 6.3..  Does not wish to be on diabetic diet.   Essential hypertension Continue Coreg , amlodipine  and telmisartan.  Blood pressure better today.   Bipolar disorder, in partial remission, most recent episode hypomanic Continue Depakote , Elavil     Parkinson's disease Continue Sinemet .  Has ongoing tremors.  Will get PT evaluation.   Constipation for last 3 to 4 days.  Subjectively suppository without any bowel movement.  Continue MiraLAX  and Colace.   Will resume Dulcolax suppository and oral Dulcolax from home.   Debility weakness tremors.     Lives by herself at home and has a walker for ambulation.  Declined PT evaluation yesterday.  Has a physical therapy lined up at home.   Disposition.  At this time, patient is stable for disposition home with outpatient PCP and neurology follow-up  Medical Consultants:   None.  Procedures:    None Subjective:   Today, patient was seen and examined at bedside.  Complains of headache.  Has had bowel movement.  No nausea  vomiting fever chills or rigor.  Discharge Exam:   Vitals:   05/29/24 1934 05/30/24 0540  BP: (!) 168/87 (!) 115/55  Pulse: 73 66  Resp: 18 18  Temp: 98.6 F (37 C) 98 F (36.7 C)  SpO2: 98% 97%   Vitals:   05/29/24 1205 05/29/24 1703 05/29/24 1934 05/30/24 0540  BP: (!)  166/138 (!) 168/85 (!) 168/87 (!) 115/55  Pulse: 70  73 66  Resp: 16  18 18   Temp: 97.7 F (36.5 C)  98.6 F (37 C) 98 F (36.7 C)  TempSrc:   Oral   SpO2: 97%  98% 97%  Weight:      Height:        General: Alert awake, not in obvious distress, mildly anxious, tremors at rest. HENT: pupils equally reacting to light,  No scleral pallor or icterus noted. Oral mucosa is moist.  Chest:  Clear breath sounds. . No crackles or wheezes.  CVS: S1 &S2 heard. No murmur.  Regular rate and rhythm. Abdomen: Soft, nontender, nondistended.  Bowel sounds are heard.   Extremities: No cyanosis, clubbing or edema.  Peripheral pulses are palpable. Psych: Alert, awake and oriented, anxious CNS:  No cranial nerve deficits.  Moves all extremities, resting tremors. Skin: Warm and dry.  No rashes noted.  The results of significant diagnostics from this hospitalization (including imaging, microbiology, ancillary and laboratory) are listed below for reference.     Diagnostic Studies:   CT Head Wo Contrast Result Date: 05/28/2024 CLINICAL DATA:  Provided history: Headache, increasing frequency or severity. EXAM: CT HEAD WITHOUT CONTRAST TECHNIQUE: Contiguous axial images were obtained from the base of the skull through the vertex without intravenous contrast. RADIATION DOSE REDUCTION: This exam was performed according to the departmental dose-optimization program which includes automated exposure control, adjustment of the mA and/or kV according to patient size and/or use of iterative reconstruction technique. COMPARISON:  Head CT 01/30/2024. FINDINGS: Brain: Mild generalized cerebral atrophy. Patchy and ill-defined hypoattenuation within the cerebral white matter, nonspecific but compatible with mild chronic small vessel ischemic disease. There is no acute intracranial hemorrhage. No demarcated cortical infarct. No extra-axial fluid collection. No evidence of an intracranial mass. No midline shift. Vascular: No  hyperdense vessel. Atherosclerotic calcifications. Skull: No calvarial fracture or aggressive osseous lesion. Sinuses/Orbits: No mass or acute finding within the imaged orbits. Postsurgical appearance of the paranasal sinuses. Severe left maxillary sinusitis. No more than trace mucosal thickening elsewhere in the paranasal sinuses. Other: Chronic nasal septal defect. IMPRESSION: 1.  No evidence of an acute intracranial abnormality. 2. Mild chronic small vessel ischemic changes within the cerebral white matter. 3. Mild generalized cerebral atrophy. 4. Severe left maxillary sinusitis. 5. Chronic nasal septal defect. Electronically Signed   By: Rockey Childs D.O.   On: 05/28/2024 13:54   CT Renal Stone Study Result Date: 05/28/2024 CLINICAL DATA:  Dizziness and abdominal pain. EXAM: CT ABDOMEN AND PELVIS WITHOUT CONTRAST TECHNIQUE: Multidetector CT imaging of the abdomen and pelvis was performed following the standard protocol without IV contrast. RADIATION DOSE REDUCTION: This exam was performed according to the departmental dose-optimization program which includes automated exposure control, adjustment of the mA and/or kV according to patient size and/or use of iterative reconstruction technique. COMPARISON:  CTA abdomen pelvis 05/24/2024 FINDINGS: Lower chest: No acute abnormality. Hepatobiliary: No focal liver abnormality is seen. Stable hepatic steatosis. Status post cholecystectomy. No biliary dilatation. Pancreas: Unremarkable. No pancreatic ductal dilatation or surrounding inflammatory changes. Spleen: Normal in size without focal abnormality.  Adrenals/Urinary Tract: Adrenal glands are unremarkable. Kidneys are normal, without renal calculi, focal lesion, or hydronephrosis. Bladder is unremarkable. Stomach/Bowel: No bowel obstruction. Some increased fluid in the proximal colon since the prior study is nonspecific but may be consistent with some degree of enteritis. No free intraperitoneal air. The appendix is  not discretely visualized. Vascular/Lymphatic: Stable atherosclerosis of the abdominal aorta without aneurysm. No lymphadenopathy identified in the abdomen or pelvis. Reproductive: Uterus and bilateral adnexa are unremarkable. Other: Stable small umbilical hernia containing fat. No ascites or abscess. Musculoskeletal: Stable appearance status post prior lumbar fusion at L3-4 and L4-5. Stable severe degenerative disc disease at L2-3. IMPRESSION: 1. Some increased fluid in the proximal colon since the prior study is nonspecific but may be consistent with some degree of enteritis. 2. Stable hepatic steatosis. 3. Stable small umbilical hernia containing fat. 4. Stable atherosclerosis of the abdominal aorta without aneurysm. 5. Stable appearance status post prior lumbar fusion at L3-4 and L4-5. Stable severe degenerative disc disease at L2-3. Electronically Signed   By: Marcey Moan M.D.   On: 05/28/2024 13:45     Labs:   Basic Metabolic Panel: Recent Labs  Lab 05/24/24 1455 05/28/24 1306 05/29/24 0708  NA  --  139 139  K  --  3.8 4.2  CL  --  98 99  CO2  --  24 25  GLUCOSE  --  113* 97  BUN  --  21 19  CREATININE 1.40* 1.45* 1.08*  CALCIUM   --  9.9 9.5   GFR Estimated Creatinine Clearance: 49.6 mL/min (A) (by C-G formula based on SCr of 1.08 mg/dL (H)). Liver Function Tests: Recent Labs  Lab 05/28/24 1306  AST 17  ALT 5  ALKPHOS 126  BILITOT 0.3  PROT 6.7  ALBUMIN  4.5   Recent Labs  Lab 05/28/24 1306  LIPASE 19   No results for input(s): AMMONIA in the last 168 hours. Coagulation profile No results for input(s): INR, PROTIME in the last 168 hours.  CBC: Recent Labs  Lab 05/28/24 1306 05/29/24 0708  WBC 5.9 7.2  NEUTROABS 4.2  --   HGB 12.0 12.4  HCT 35.5* 39.6  MCV 95.4 97.1  PLT 229 247   Cardiac Enzymes: No results for input(s): CKTOTAL, CKMB, CKMBINDEX, TROPONINI in the last 168 hours. BNP: Invalid input(s): POCBNP CBG: No results for  input(s): GLUCAP in the last 168 hours. D-Dimer No results for input(s): DDIMER in the last 72 hours. Hgb A1c Recent Labs    05/29/24 0708  HGBA1C 6.3*   Lipid Profile No results for input(s): CHOL, HDL, LDLCALC, TRIG, CHOLHDL, LDLDIRECT in the last 72 hours. Thyroid function studies No results for input(s): TSH, T4TOTAL, T3FREE, THYROIDAB in the last 72 hours.  Invalid input(s): FREET3 Anemia work up No results for input(s): VITAMINB12, FOLATE, FERRITIN, TIBC, IRON , RETICCTPCT in the last 72 hours. Microbiology No results found for this or any previous visit (from the past 240 hours).   Discharge Instructions:   Discharge Instructions     Diet general   Complete by: As directed    Discharge instructions   Complete by: As directed    Follow-up with your primary care provider in 1 week.  Check blood work at that time.  Follow-up with your neurologist for headache.  Seek medical attention for worsening symptoms.  Consider  assisted living facility-discuss with your primary care provider.   Increase activity slowly   Complete by: As directed       Allergies  as of 05/30/2024       Reactions   Amoxicillin Hives, Rash, Other (See Comments)   Sweats, too   Levofloxacin Other (See Comments)   Dizziness   Atropine Hives, Rash   Latex Other (See Comments)   Irritates the skin   Tramadol  Nausea Only, Other (See Comments)   Severe nausea and worsened headache   Amoxicillin-pot Clavulanate Rash, Dermatitis, Hives   Other Reaction(s): GI Intolerance        Medication List     STOP taking these medications    hyoscyamine  0.125 MG tablet Commonly known as: LEVSIN    pregabalin  75 MG capsule Commonly known as: LYRICA        TAKE these medications    amitriptyline  25 MG tablet Commonly known as: ELAVIL  Take 25 mg by mouth at bedtime. What changed: Another medication with the same name was removed. Continue taking this  medication, and follow the directions you see here.   amLODipine  5 MG tablet Commonly known as: NORVASC  Take 5 mg by mouth in the morning and at bedtime.   aspirin  EC 81 MG tablet Take 81 mg by mouth daily. Swallow whole.   Azelastine  HCl 137 MCG/SPRAY Soln Place 1 spray into both nostrils in the morning and at bedtime. AS DIRECTED   Biofreeze 4 % Gel Generic drug: Menthol  (Topical Analgesic) Apply 1 application  topically 3 (three) times daily as needed (pain).   bisacodyl  5 MG EC tablet Commonly known as: DULCOLAX Take 5-15 mg by mouth at bedtime as needed (constipation).   bisacodyl  10 MG suppository Commonly known as: DULCOLAX Place 10 mg rectally as needed for moderate constipation.   Black Cohosh 200 MG Caps Take 200 mg by mouth daily.   Brain Support Caps Take 1 capsule by mouth in the morning and at bedtime.   buprenorphine  20 MCG/HR Ptwk Commonly known as: BUTRANS  Place 1 patch onto the skin every Friday.   butalbital -acetaminophen -caffeine  50-325-40 MG tablet Commonly known as: FIORICET  Take 1 tablet by mouth 2 (two) times daily as needed for headache.   CALCIUM  CITRATE + D PO Take 1-2 capsules by mouth See admin instructions. Calcium  200 mg, vitamin D3 2.5 mcg - 100 units - take 1 tablet by mouth with breakfast and 2 tablets with supper   carbidopa -levodopa  25-100 MG tablet Commonly known as: SINEMET  IR Take 2 tablets by mouth 4 (four) times daily -  with meals and at bedtime.   carvedilol  12.5 MG tablet Commonly known as: COREG  Take 12.5 mg by mouth 2 (two) times daily with a meal. What changed: Another medication with the same name was removed. Continue taking this medication, and follow the directions you see here.   CoQ10 100 MG Caps Take 100 mg by mouth daily.   diclofenac  75 MG EC tablet Commonly known as: VOLTAREN  Take 75 mg by mouth 2 (two) times daily.   dicyclomine  10 MG capsule Commonly known as: BENTYL  Take 10-20 mg by mouth 3 (three)  times daily as needed for spasms.   divalproex  250 MG 24 hr tablet Commonly known as: DEPAKOTE  ER Take 1 tablet (250 mg total) by mouth 2 (two) times daily. What changed:  how much to take when to take this additional instructions   docusate sodium  100 MG capsule Commonly known as: COLACE Take 300-400 mg by mouth at bedtime.   fluticasone  50 MCG/ACT nasal spray Commonly known as: FLONASE  Place 1 spray into both nostrils daily.   Jardiance 10 MG Tabs tablet Generic drug: empagliflozin  Take 10 mg by mouth daily.   levocetirizine 5 MG tablet Commonly known as: XYZAL Take 5 mg by mouth every evening.   nitroGLYCERIN  0.4 MG SL tablet Commonly known as: NITROSTAT  Place 0.4 mg under the tongue every 5 (five) minutes as needed for chest pain.   Plecanatide 3 MG Tabs Take 3 mg by mouth every morning. 30 minutes before breakfast   polyethylene glycol 17 g packet Commonly known as: MIRALAX  / GLYCOLAX  Take 17 g by mouth daily as needed for moderate constipation.   QUEtiapine  300 MG tablet Commonly known as: SEROQUEL  TAKE 0.5 TABLETS (150 MG TOTAL) BY MOUTH AT BEDTIME AS NEEDED.   rosuvastatin  20 MG tablet Commonly known as: CRESTOR  Take 20 mg by mouth daily.   telmisartan 80 MG tablet Commonly known as: MICARDIS Take 80 mg by mouth at bedtime.   Turmeric 500 MG Caps Take 500 mg by mouth 2 (two) times daily with a meal. extract   Vitamin D3 50 MCG (2000 UT) capsule Take 2,000 Units by mouth daily.        Follow-up Information     [name], DO Follow up in 1 week(s).   Specialty: Family Medicine Contact information: [address] [place] [address] [phone]                  Time coordinating discharge: 39 minutes  Signed:  Racquelle Hyser  [facility] 05/30/2024, 2:38 PM

## 2024-05-30 NOTE — Plan of Care (Signed)
  Problem: Education: Goal: Knowledge of General Education information will improve Description: Including pain rating scale, medication(s)/side effects and non-pharmacologic comfort measures 05/30/2024 1313 by Morna Aquas, RN Outcome: Adequate for Discharge 05/30/2024 0831 by Morna Aquas, RN Outcome: Progressing   Problem: Health Behavior/Discharge Planning: Goal: Ability to manage health-related needs will improve Outcome: Adequate for Discharge   Problem: Clinical Measurements: Goal: Ability to maintain clinical measurements within normal limits will improve Outcome: Adequate for Discharge Goal: Will remain free from infection Outcome: Adequate for Discharge Goal: Diagnostic test results will improve Outcome: Adequate for Discharge Goal: Respiratory complications will improve Outcome: Adequate for Discharge Goal: Cardiovascular complication will be avoided Outcome: Adequate for Discharge   Problem: Activity: Goal: Risk for activity intolerance will decrease 05/30/2024 1313 by Morna Aquas, RN Outcome: Adequate for Discharge 05/30/2024 0831 by Morna Aquas, RN Outcome: Progressing   Problem: Nutrition: Goal: Adequate nutrition will be maintained Outcome: Adequate for Discharge   Problem: Coping: Goal: Level of anxiety will decrease 05/30/2024 1313 by Morna Aquas, RN Outcome: Adequate for Discharge 05/30/2024 0831 by Morna Aquas, RN Outcome: Progressing   Problem: Elimination: Goal: Will not experience complications related to bowel motility 05/30/2024 1313 by Morna Aquas, RN Outcome: Adequate for Discharge 05/30/2024 0831 by Morna Aquas, RN Outcome: Progressing Goal: Will not experience complications related to urinary retention 05/30/2024 1313 by Morna Aquas, RN Outcome: Adequate for Discharge 05/30/2024 0831 by Morna Aquas, RN Outcome: Progressing   Problem: Pain Managment: Goal: General experience of comfort will improve and/or  be controlled Outcome: Adequate for Discharge   Problem: Safety: Goal: Ability to remain free from injury will improve 05/30/2024 1313 by Morna Aquas, RN Outcome: Adequate for Discharge 05/30/2024 0831 by Morna Aquas, RN Outcome: Progressing   Problem: Skin Integrity: Goal: Risk for impaired skin integrity will decrease Outcome: Adequate for Discharge

## 2024-06-01 DIAGNOSIS — R1031 Right lower quadrant pain: Secondary | ICD-10-CM | POA: Diagnosis not present

## 2024-06-01 DIAGNOSIS — Z09 Encounter for follow-up examination after completed treatment for conditions other than malignant neoplasm: Secondary | ICD-10-CM | POA: Diagnosis not present

## 2024-06-17 DIAGNOSIS — M5416 Radiculopathy, lumbar region: Secondary | ICD-10-CM | POA: Diagnosis not present

## 2024-06-17 DIAGNOSIS — R109 Unspecified abdominal pain: Secondary | ICD-10-CM | POA: Diagnosis not present

## 2024-06-17 DIAGNOSIS — M47812 Spondylosis without myelopathy or radiculopathy, cervical region: Secondary | ICD-10-CM | POA: Diagnosis not present

## 2024-06-17 DIAGNOSIS — R29898 Other symptoms and signs involving the musculoskeletal system: Secondary | ICD-10-CM | POA: Diagnosis not present

## 2024-06-22 DIAGNOSIS — Z79899 Other long term (current) drug therapy: Secondary | ICD-10-CM | POA: Diagnosis not present

## 2024-06-22 DIAGNOSIS — E860 Dehydration: Secondary | ICD-10-CM | POA: Diagnosis not present

## 2024-06-22 DIAGNOSIS — Z7982 Long term (current) use of aspirin: Secondary | ICD-10-CM | POA: Diagnosis not present

## 2024-06-22 DIAGNOSIS — R519 Headache, unspecified: Secondary | ICD-10-CM | POA: Diagnosis not present

## 2024-06-22 DIAGNOSIS — R112 Nausea with vomiting, unspecified: Secondary | ICD-10-CM | POA: Diagnosis not present

## 2024-06-22 DIAGNOSIS — R4781 Slurred speech: Secondary | ICD-10-CM | POA: Diagnosis not present

## 2024-06-22 DIAGNOSIS — R55 Syncope and collapse: Secondary | ICD-10-CM | POA: Diagnosis not present

## 2024-06-22 DIAGNOSIS — R451 Restlessness and agitation: Secondary | ICD-10-CM | POA: Diagnosis not present

## 2024-06-22 DIAGNOSIS — G20A1 Parkinson's disease without dyskinesia, without mention of fluctuations: Secondary | ICD-10-CM | POA: Diagnosis not present

## 2024-06-22 DIAGNOSIS — E119 Type 2 diabetes mellitus without complications: Secondary | ICD-10-CM | POA: Diagnosis not present

## 2024-06-22 DIAGNOSIS — N179 Acute kidney failure, unspecified: Secondary | ICD-10-CM | POA: Diagnosis not present

## 2024-06-22 DIAGNOSIS — F319 Bipolar disorder, unspecified: Secondary | ICD-10-CM | POA: Diagnosis not present

## 2024-06-22 DIAGNOSIS — R531 Weakness: Secondary | ICD-10-CM | POA: Diagnosis not present

## 2024-06-22 DIAGNOSIS — I10 Essential (primary) hypertension: Secondary | ICD-10-CM | POA: Diagnosis not present

## 2024-06-22 DIAGNOSIS — E785 Hyperlipidemia, unspecified: Secondary | ICD-10-CM | POA: Diagnosis not present

## 2024-06-24 DIAGNOSIS — I10 Essential (primary) hypertension: Secondary | ICD-10-CM | POA: Diagnosis not present

## 2024-06-24 DIAGNOSIS — F319 Bipolar disorder, unspecified: Secondary | ICD-10-CM | POA: Diagnosis not present

## 2024-06-24 DIAGNOSIS — J0101 Acute recurrent maxillary sinusitis: Secondary | ICD-10-CM | POA: Diagnosis not present

## 2024-06-24 DIAGNOSIS — G20A1 Parkinson's disease without dyskinesia, without mention of fluctuations: Secondary | ICD-10-CM | POA: Diagnosis not present

## 2024-06-24 DIAGNOSIS — E785 Hyperlipidemia, unspecified: Secondary | ICD-10-CM | POA: Diagnosis not present

## 2024-06-24 DIAGNOSIS — F419 Anxiety disorder, unspecified: Secondary | ICD-10-CM | POA: Diagnosis not present

## 2024-06-24 DIAGNOSIS — Z7982 Long term (current) use of aspirin: Secondary | ICD-10-CM | POA: Diagnosis not present

## 2024-06-24 DIAGNOSIS — Z7984 Long term (current) use of oral hypoglycemic drugs: Secondary | ICD-10-CM | POA: Diagnosis not present

## 2024-06-24 DIAGNOSIS — M48061 Spinal stenosis, lumbar region without neurogenic claudication: Secondary | ICD-10-CM | POA: Diagnosis not present

## 2024-06-24 DIAGNOSIS — R7303 Prediabetes: Secondary | ICD-10-CM | POA: Diagnosis not present

## 2024-06-29 DIAGNOSIS — M4807 Spinal stenosis, lumbosacral region: Secondary | ICD-10-CM | POA: Diagnosis not present

## 2024-06-29 DIAGNOSIS — M419 Scoliosis, unspecified: Secondary | ICD-10-CM | POA: Diagnosis not present

## 2024-06-29 DIAGNOSIS — M4804 Spinal stenosis, thoracic region: Secondary | ICD-10-CM | POA: Diagnosis not present

## 2024-06-29 DIAGNOSIS — M48061 Spinal stenosis, lumbar region without neurogenic claudication: Secondary | ICD-10-CM | POA: Diagnosis not present

## 2024-06-30 DIAGNOSIS — E785 Hyperlipidemia, unspecified: Secondary | ICD-10-CM | POA: Diagnosis not present

## 2024-06-30 DIAGNOSIS — J0101 Acute recurrent maxillary sinusitis: Secondary | ICD-10-CM | POA: Diagnosis not present

## 2024-06-30 DIAGNOSIS — M48061 Spinal stenosis, lumbar region without neurogenic claudication: Secondary | ICD-10-CM | POA: Diagnosis not present

## 2024-06-30 DIAGNOSIS — G20A1 Parkinson's disease without dyskinesia, without mention of fluctuations: Secondary | ICD-10-CM | POA: Diagnosis not present

## 2024-06-30 DIAGNOSIS — R7303 Prediabetes: Secondary | ICD-10-CM | POA: Diagnosis not present

## 2024-06-30 DIAGNOSIS — Z7984 Long term (current) use of oral hypoglycemic drugs: Secondary | ICD-10-CM | POA: Diagnosis not present

## 2024-06-30 DIAGNOSIS — F319 Bipolar disorder, unspecified: Secondary | ICD-10-CM | POA: Diagnosis not present

## 2024-06-30 DIAGNOSIS — F419 Anxiety disorder, unspecified: Secondary | ICD-10-CM | POA: Diagnosis not present

## 2024-06-30 DIAGNOSIS — I10 Essential (primary) hypertension: Secondary | ICD-10-CM | POA: Diagnosis not present

## 2024-06-30 DIAGNOSIS — Z7982 Long term (current) use of aspirin: Secondary | ICD-10-CM | POA: Diagnosis not present

## 2024-07-06 DIAGNOSIS — H65191 Other acute nonsuppurative otitis media, right ear: Secondary | ICD-10-CM | POA: Diagnosis not present

## 2024-07-06 DIAGNOSIS — H7291 Unspecified perforation of tympanic membrane, right ear: Secondary | ICD-10-CM | POA: Diagnosis not present

## 2024-07-06 DIAGNOSIS — H60312 Diffuse otitis externa, left ear: Secondary | ICD-10-CM | POA: Diagnosis not present

## 2024-07-13 DIAGNOSIS — B372 Candidiasis of skin and nail: Secondary | ICD-10-CM | POA: Diagnosis not present

## 2024-07-13 DIAGNOSIS — Z23 Encounter for immunization: Secondary | ICD-10-CM | POA: Diagnosis not present

## 2024-07-13 DIAGNOSIS — H6693 Otitis media, unspecified, bilateral: Secondary | ICD-10-CM | POA: Diagnosis not present

## 2024-07-13 DIAGNOSIS — Z6827 Body mass index (BMI) 27.0-27.9, adult: Secondary | ICD-10-CM | POA: Diagnosis not present

## 2024-07-13 DIAGNOSIS — H7293 Unspecified perforation of tympanic membrane, bilateral: Secondary | ICD-10-CM | POA: Diagnosis not present

## 2024-07-15 DIAGNOSIS — G43701 Chronic migraine without aura, not intractable, with status migrainosus: Secondary | ICD-10-CM | POA: Diagnosis not present

## 2024-07-15 DIAGNOSIS — M542 Cervicalgia: Secondary | ICD-10-CM | POA: Diagnosis not present

## 2024-07-15 DIAGNOSIS — G4486 Cervicogenic headache: Secondary | ICD-10-CM | POA: Diagnosis not present

## 2024-07-15 DIAGNOSIS — R471 Dysarthria and anarthria: Secondary | ICD-10-CM | POA: Diagnosis not present

## 2024-07-30 DIAGNOSIS — F319 Bipolar disorder, unspecified: Secondary | ICD-10-CM | POA: Diagnosis not present

## 2024-07-30 DIAGNOSIS — R7303 Prediabetes: Secondary | ICD-10-CM | POA: Diagnosis not present

## 2024-07-30 DIAGNOSIS — M48061 Spinal stenosis, lumbar region without neurogenic claudication: Secondary | ICD-10-CM | POA: Diagnosis not present

## 2024-07-30 DIAGNOSIS — E785 Hyperlipidemia, unspecified: Secondary | ICD-10-CM | POA: Diagnosis not present

## 2024-07-30 DIAGNOSIS — Z7984 Long term (current) use of oral hypoglycemic drugs: Secondary | ICD-10-CM | POA: Diagnosis not present

## 2024-07-30 DIAGNOSIS — I10 Essential (primary) hypertension: Secondary | ICD-10-CM | POA: Diagnosis not present

## 2024-07-30 DIAGNOSIS — G20A1 Parkinson's disease without dyskinesia, without mention of fluctuations: Secondary | ICD-10-CM | POA: Diagnosis not present

## 2024-07-30 DIAGNOSIS — Z7982 Long term (current) use of aspirin: Secondary | ICD-10-CM | POA: Diagnosis not present

## 2024-07-30 DIAGNOSIS — F419 Anxiety disorder, unspecified: Secondary | ICD-10-CM | POA: Diagnosis not present

## 2024-08-02 ENCOUNTER — Encounter: Payer: Self-pay | Admitting: Radiology

## 2024-08-03 ENCOUNTER — Other Ambulatory Visit: Payer: Self-pay

## 2024-08-03 ENCOUNTER — Encounter (HOSPITAL_BASED_OUTPATIENT_CLINIC_OR_DEPARTMENT_OTHER): Payer: Self-pay

## 2024-08-03 ENCOUNTER — Emergency Department (HOSPITAL_BASED_OUTPATIENT_CLINIC_OR_DEPARTMENT_OTHER)
Admission: EM | Admit: 2024-08-03 | Discharge: 2024-08-03 | Discharge status: Against Medical Advice | Attending: Emergency Medicine | Admitting: Emergency Medicine

## 2024-08-03 DIAGNOSIS — Z5321 Procedure and treatment not carried out due to patient leaving prior to being seen by health care provider: Secondary | ICD-10-CM | POA: Insufficient documentation

## 2024-08-03 DIAGNOSIS — R519 Headache, unspecified: Secondary | ICD-10-CM | POA: Diagnosis not present

## 2024-08-03 DIAGNOSIS — G43909 Migraine, unspecified, not intractable, without status migrainosus: Secondary | ICD-10-CM | POA: Diagnosis not present

## 2024-08-03 NOTE — ED Triage Notes (Addendum)
 Brought in by [facility] from home. Chronic migraines.   She reports migraine since June. Worsening last night. Tylenol  1000 mg and migraine med PO 0900. Nausea. Denies vomiting this morning.

## 2024-08-05 ENCOUNTER — Telehealth: Payer: Self-pay | Admitting: Psychiatry

## 2024-08-05 DIAGNOSIS — G43701 Chronic migraine without aura, not intractable, with status migrainosus: Secondary | ICD-10-CM | POA: Diagnosis not present

## 2024-08-05 DIAGNOSIS — I6621 Occlusion and stenosis of right posterior cerebral artery: Secondary | ICD-10-CM | POA: Diagnosis not present

## 2024-08-05 DIAGNOSIS — I6523 Occlusion and stenosis of bilateral carotid arteries: Secondary | ICD-10-CM | POA: Diagnosis not present

## 2024-08-05 NOTE — Telephone Encounter (Signed)
 Pt called to request an adjustment in her medications.   Last seen 4/25.   increase Depakote  ER 250 mg   For mania and HA prevention.  Note says she stopped Seroquel  but she reports taking PRN. She reports taking amitriptyline  25 prn also, but that is prescribed by [facility].   She reports about once a day going from 0 to 100. She is moving out of her home of 26 years to move into senior living.   It appears she went to ED 11/4 for migraine but left without being seen.   She reports difficulty getting around, rarely leaves the house. She is noncompliant with FU. She does appear high intensity when speaking with her and speech is fast, but she is coherent.

## 2024-08-05 NOTE — Telephone Encounter (Signed)
 Pt wants a CB from Nurse or Dr. Geoffry. She said about once a day she has a moment where she really loses it. Wanting to know if she can increase 1 of her medications.

## 2024-08-06 NOTE — Telephone Encounter (Addendum)
 LVM per DPR with recommendations.

## 2024-08-06 NOTE — Telephone Encounter (Signed)
 Is she consistently taking Depakote  for the mania?  If not do so. If consisitently taking Depakote  ER 250 mg daily then increase to 2 of the Depakote  tablets .  Taking it at night reduces SE risk.

## 2024-08-11 ENCOUNTER — Telehealth: Payer: Self-pay | Admitting: Psychiatry

## 2024-08-11 NOTE — Telephone Encounter (Signed)
 Patient called back and I was on the phone. Called her after completing call and had to LM.

## 2024-08-11 NOTE — Telephone Encounter (Signed)
 LVM to [facility]

## 2024-08-11 NOTE — Telephone Encounter (Signed)
 Pt LVM @11 :34a  asking for a call back on what medications she should be taking.

## 2024-08-13 ENCOUNTER — Telehealth: Payer: Self-pay | Admitting: Psychiatry

## 2024-08-13 NOTE — Telephone Encounter (Signed)
 Addressed in another message.

## 2024-08-13 NOTE — Telephone Encounter (Signed)
 Pt was called back but had to LVM. Called again today and had to LVM.

## 2024-08-13 NOTE — Telephone Encounter (Signed)
Reviewed recommendations with the patient.

## 2024-08-13 NOTE — Telephone Encounter (Signed)
 Yes this should work to reduce her from flying off the handle.  She has not been willing to take 750 mg daily in the past.  That is a better dose than less, so keep taking 1 in the AM and 2 tablets at night.

## 2024-08-13 NOTE — Telephone Encounter (Signed)
 Pt called reporting never received call back. Stated not sure if taking meds correctly. Ask someone to call me back. [phone]

## 2024-08-13 NOTE — Telephone Encounter (Signed)
 Pt wanted to know how to take Depakote  ER 250 mg. I reviewed with her several times how she was supposed to take.  Last Rx says take Depakote  ER 250 BID. She said for the last few days she has been taking 1 QAM and 2 QPM. I again reviewed recommendations from 11/7. She asks if this will keep her from flying off the handle.   Is she consistently taking Depakote  for the mania?  If not do so. If consisitently taking Depakote  ER 250 mg daily then increase to 2 of the Depakote  tablets .  Taking it at night reduces SE risk.

## 2024-08-19 NOTE — Telephone Encounter (Signed)
 She needs an appt at earliest convenience.

## 2024-08-23 NOTE — Telephone Encounter (Signed)
 Pt is scheduled 12/23

## 2024-08-24 DIAGNOSIS — I6523 Occlusion and stenosis of bilateral carotid arteries: Secondary | ICD-10-CM | POA: Diagnosis not present

## 2024-08-25 DIAGNOSIS — E785 Hyperlipidemia, unspecified: Secondary | ICD-10-CM | POA: Diagnosis not present

## 2024-08-25 DIAGNOSIS — Z0000 Encounter for general adult medical examination without abnormal findings: Secondary | ICD-10-CM | POA: Diagnosis not present

## 2024-08-25 DIAGNOSIS — E119 Type 2 diabetes mellitus without complications: Secondary | ICD-10-CM | POA: Diagnosis not present

## 2024-08-25 DIAGNOSIS — I10 Essential (primary) hypertension: Secondary | ICD-10-CM | POA: Diagnosis not present

## 2024-08-30 ENCOUNTER — Telehealth (HOSPITAL_BASED_OUTPATIENT_CLINIC_OR_DEPARTMENT_OTHER): Payer: Self-pay | Admitting: Licensed Clinical Social Worker

## 2024-08-30 ENCOUNTER — Telehealth: Payer: Self-pay | Admitting: Cardiology

## 2024-08-30 DIAGNOSIS — I2510 Atherosclerotic heart disease of native coronary artery without angina pectoris: Secondary | ICD-10-CM

## 2024-08-30 NOTE — Telephone Encounter (Signed)
 Pt c/o Shortness Of Breath: STAT if SOB developed within the last 24 hours or pt is noticeably SOB on the phone  1. Are you currently SOB (can you hear that pt is SOB on the phone)? No   2. How long have you been experiencing SOB? 2 months   3. Are you SOB when sitting or when up moving around? Moving around   4. Are you currently experiencing any other symptoms?  No

## 2024-08-30 NOTE — Telephone Encounter (Signed)
 Spoke with patient of Dr. Sheena who reports progressive dyspnea over last 2 months. Unable to walk 4-5 steps without stopping to rest. She said she may have had a small stroke. She has lost 55lbs in 4 months -- stomach issues, headaches, unable to eat well.   Her last visit was >1 year ago.   She has transportation limitations. Unable to drive. Needs mobility assistance with walker, getting in/out of car. Currently she uses [employer] for transportation and has to pay $130 for a trip to the doctor. Advised will route to our social worker to review for possible assistance. She has to give her current transportation company a 1 day notice for visits.   Scheduled for 09/02/24 with Dr. Sheena at [facility]

## 2024-08-30 NOTE — Telephone Encounter (Signed)
 H&V Care Navigation CSW Progress Note  Clinical Social Worker contacted patient by phone to f/u on transportation concerns (cost). Was able to reach pt today at [phone]. Introduced self, role, reason for call. Confirmed home address, name and DOB. Pt resides alone since spouse passed. She has in home assistance from [facility], who also provide her transportation. She has a minimum of 4 hours a week but usually she has them for 2-3 days during the week to assist. She feels this is expensive for her but is okay taking it this week to appt. We discussed [employer] and insurance transportation if eligible. Pt okay with this information and my card being sent to her home address. Encouraged her to call again if needed, no additional questions/concerns when asked.  Patient is participating in a Managed Medicaid Plan:  No, BCBS Medicare  SDOH Screenings   Food Insecurity: No Food Insecurity (05/28/2024)  Housing: Low Risk  (05/28/2024)  Transportation Needs: No Transportation Needs (05/28/2024)  Utilities: Not At Risk (05/28/2024)  Depression (PHQ2-9): Low Risk  (04/18/2023)  Financial Resource Strain: Patient Declined (02/26/2024)   Received from [facility]  Social Connections: Moderately Integrated (05/28/2024)  Tobacco Use: Low Risk  (08/03/2024)    Marit Lark, MSW, LCSW Clinical Social Worker II [facility] Heart/Vascular Care Navigation  [phone]- work cell phone (preferred)

## 2024-08-30 NOTE — Addendum Note (Signed)
 Addended by: LORING ANDRIETTE HERO on: 08/30/2024 02:52 PM   Modules accepted: Orders

## 2024-08-31 ENCOUNTER — Encounter: Payer: Self-pay | Admitting: *Deleted

## 2024-09-02 ENCOUNTER — Telehealth: Payer: Self-pay | Admitting: Cardiology

## 2024-09-02 ENCOUNTER — Ambulatory Visit: Admitting: Cardiology

## 2024-09-02 NOTE — Telephone Encounter (Signed)
 Pt scheduled to see Rosaline Pavy 12/15 for further evaluation. Pt agreeable to plan.

## 2024-09-02 NOTE — Telephone Encounter (Signed)
 Pt c/o Shortness Of Breath: STAT if SOB developed within the last 24 hours or pt is noticeably SOB on the phone  1. Are you currently SOB (can you hear that pt is SOB on the phone)? no  2. How long have you been experiencing SOB? A month   3. Are you SOB when sitting or when up moving around? Moving around   4. Are you currently experiencing any other symptoms? No    Pt requesting a sooner appt with Dr. Sheena due to SOB. Did not want to schedule and be added to waitlist.

## 2024-09-06 NOTE — Telephone Encounter (Signed)
 Patient called stating she has not received information about the transportation as discussed.

## 2024-09-06 NOTE — Progress Notes (Deleted)
 Cardiology Office Note:  .   Date:  09/06/2024  ID:  Kristin Gilmore, DOB 1950-10-29, MRN [id number] PCP: [name], DO  [facility] Providers Cardiologist:  Dub Huntsman, DO { Click to update primary MD,subspecialty MD or APP then REFRESH:1}   History of Present Illness: .   Kristin Gilmore is a 73 y.o. female   with minimal coronary artery disease seen on coronary CTA, dyslipidemia, hypertension, prediabetes, Parkinson disease and anxiety.  At her last visit she reported chest pain which was concerning with her risk factors I sent the patient for coronary CTA.  Coronary CTA came back with minimal coronary artery disease.   Patients reports progressive dyspnea over last 2 months. Unable to walk 4-5 steps without stopping to rest. She said she may have had a small stroke. She has lost 55lbs in 4 months -- stomach issues, headaches, unable to eat well.     ROS: ***  Studies Reviewed: SABRA         Prior CV Studies: {Select studies to display:26339}  Coronary CTA 2024 Coronary Calcium  Score:   Left main: 0   Left anterior descending artery: 22.2   Left circumflex artery: 0   Right coronary artery: 0   Total: 22.2   Percentile: 45th   Other findings:   Normal pulmonary vein drainage into the left atrium.   Normal let atrial appendage without a thrombus.   Normal size of the pulmonary artery.   Mitral annular calcification.   Aortic atherosclerosis.   Non-cardiac: See separate report from [facility].   IMPRESSION: 1. Coronary calcium  score of 22.2. This was 45th percentile for age-, sex, and race-matched controls.   2. Total plaque volume 71 mm3 which is 21st percentile for age- and sex-matched controls (calcified plaque 8 mm3; non-calcified plaque 63 mm3). TPV is mild.   3. Normal coronary origin with right dominance.   4. There is minimal (<25%) mixed plaque in the LAD and RCA. CAD-RADS 1.   5.  Aortic atherosclerosis.   6.   Mitral annular calcification.  Echo 04/2022 IMPRESSIONS     1. Study was not completed as the patient became combative.   2. Left ventricular ejection fraction, by estimation, is 60 to 65%. The  left ventricle has normal function. The left ventricle has no regional  wall motion abnormalities. Left ventricular diastolic function could not  be evaluated.   3. Right ventricular systolic function is normal. The right ventricular  size is normal.   4. Left atrial size was severely dilated.   5. The mitral valve is normal in structure. Mild mitral valve  regurgitation. No evidence of mitral stenosis.   6. The aortic valve is tricuspid. Aortic valve regurgitation is not  visualized. No aortic stenosis is present.    Risk Assessment/Calculations:   {Does this patient have ATRIAL FIBRILLATION?[phone]} No BP recorded.  {Refresh Note OR Click here to enter BP  :1}***       Physical Exam:   VS:  There were no vitals taken for this visit.   Orhtostatics: No data found. Wt Readings from Last 3 Encounters:  08/03/24 170 lb (77.1 kg)  05/28/24 175 lb (79.4 kg)  04/03/24 195 lb 1.7 oz (88.5 kg)    GEN: Well nourished, well developed in no acute distress NECK: No JVD; No carotid bruits CARDIAC: ***RRR, no murmurs, rubs, gallops RESPIRATORY:  Clear to auscultation without rales, wheezing or rhonchi  ABDOMEN: Soft, non-tender, non-distended EXTREMITIES:  No edema; No  deformity   ASSESSMENT AND PLAN: .    Dyspnea  CAD minimal on coronary CTA 07/2023 calcium  score 22.2 <25% LAD & RCA   HTN  HLD  Pre diabetes  Parkinsons disease     {Are you ordering a CV Procedure (e.g. stress test, cath, DCCV, TEE, etc)?   Press F2        :[id number]}  Dispo: ***  Signed, Olivia Pavy, PA-C

## 2024-09-07 DIAGNOSIS — E041 Nontoxic single thyroid nodule: Secondary | ICD-10-CM | POA: Diagnosis not present

## 2024-09-07 NOTE — Telephone Encounter (Signed)
 H&V Care Navigation CSW Progress Note  Clinical Social Worker contacted patient by phone to f/u on information about transportation. Pt answered at [phone]. Pt shares that she received the resources yesterday after contacting the office- since she needs physical assist down the stairs/doesn't have a ramp to get her power wheelchair out of the house (and doesn't think she needs a ramp at this time) that she plans on using her current transportation services still. No additional questions, encouraged her to call me as needed.  Patient is participating in a Managed Medicaid Plan:  No, BCBS Medicare  SDOH Screenings   Food Insecurity: No Food Insecurity (05/28/2024)  Housing: Low Risk  (05/28/2024)  Transportation Needs: No Transportation Needs (05/28/2024)  Utilities: Not At Risk (05/28/2024)  Depression (PHQ2-9): Low Risk  (04/18/2023)  Financial Resource Strain: Patient Declined (02/26/2024)   Received from [facility]  Social Connections: Moderately Integrated (05/28/2024)  Tobacco Use: Low Risk  (08/03/2024)    Marit Lark, MSW, LCSW Clinical Social Worker II [facility] Heart/Vascular Care Navigation  [phone]- work cell phone (preferred)

## 2024-09-09 ENCOUNTER — Other Ambulatory Visit: Payer: Self-pay | Admitting: Family Medicine

## 2024-09-09 DIAGNOSIS — M5416 Radiculopathy, lumbar region: Secondary | ICD-10-CM | POA: Diagnosis not present

## 2024-09-09 DIAGNOSIS — R109 Unspecified abdominal pain: Secondary | ICD-10-CM | POA: Diagnosis not present

## 2024-09-09 DIAGNOSIS — G894 Chronic pain syndrome: Secondary | ICD-10-CM | POA: Diagnosis not present

## 2024-09-09 DIAGNOSIS — M47812 Spondylosis without myelopathy or radiculopathy, cervical region: Secondary | ICD-10-CM | POA: Diagnosis not present

## 2024-09-09 DIAGNOSIS — E041 Nontoxic single thyroid nodule: Secondary | ICD-10-CM

## 2024-09-13 ENCOUNTER — Other Ambulatory Visit: Payer: Self-pay | Admitting: Psychiatry

## 2024-09-13 ENCOUNTER — Ambulatory Visit: Admitting: Physician Assistant

## 2024-09-13 DIAGNOSIS — F3171 Bipolar disorder, in partial remission, most recent episode hypomanic: Secondary | ICD-10-CM

## 2024-09-16 ENCOUNTER — Telehealth: Payer: Self-pay

## 2024-09-16 NOTE — Telephone Encounter (Signed)
 PA Quetiapine  300 mg #15/30 day with BCBS Medicare [place]

## 2024-09-16 NOTE — Telephone Encounter (Signed)
 noted

## 2024-09-16 NOTE — Telephone Encounter (Signed)
 Melia at [facility] @ 9:51a that this medication has been approved from today for 1 year.

## 2024-09-21 ENCOUNTER — Ambulatory Visit: Admitting: Psychiatry

## 2024-09-21 ENCOUNTER — Encounter: Payer: Self-pay | Admitting: Psychiatry

## 2024-09-21 DIAGNOSIS — F411 Generalized anxiety disorder: Secondary | ICD-10-CM

## 2024-09-21 DIAGNOSIS — G43001 Migraine without aura, not intractable, with status migrainosus: Secondary | ICD-10-CM

## 2024-09-21 DIAGNOSIS — F319 Bipolar disorder, unspecified: Secondary | ICD-10-CM

## 2024-09-21 DIAGNOSIS — F3110 Bipolar disorder, current episode manic without psychotic features, unspecified: Secondary | ICD-10-CM

## 2024-09-21 DIAGNOSIS — F5105 Insomnia due to other mental disorder: Secondary | ICD-10-CM
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
## 2024-09-27 ENCOUNTER — Telehealth: Payer: Self-pay | Admitting: Psychiatry

## 2024-09-27 ENCOUNTER — Telehealth: Payer: Self-pay

## 2024-09-27 ENCOUNTER — Ambulatory Visit
Admission: RE | Admit: 2024-09-27 | Discharge: 2024-09-27 | Disposition: A | Source: Ambulatory Visit | Attending: Family Medicine

## 2024-09-27 DIAGNOSIS — E041 Nontoxic single thyroid nodule: Secondary | ICD-10-CM

## 2024-09-27 DIAGNOSIS — F3110 Bipolar disorder, current episode manic without psychotic features, unspecified: Secondary | ICD-10-CM

## 2024-09-27 NOTE — Telephone Encounter (Signed)
 Last seen 12/23: increase Depakote  ER 250 mg  to 2 tablets twice daily.   For mania and HA prevention.  She agrees.    Last Rx  Take 125-250 mg by mouth See admin instructions. 1 tablet in the AM and 2 tablets at night    Which dosing is correct?

## 2024-09-27 NOTE — Telephone Encounter (Signed)
 Pt needs rf of Depakote  ER. Only has enough to last until tomorrow morning.     [facility] [address]

## 2024-09-27 NOTE — Telephone Encounter (Signed)
 My last note said to increase the Depakote  250 mg tablets to 2 twice daily so that is what she should be on.  If she needs a prescription send in 1 month supply +1 refill

## 2024-09-28 MED ORDER — DIVALPROEX SODIUM ER 250 MG PO TB24: 500.0000 mg | ORAL_TABLET | Freq: Two times a day (BID) | ORAL | 0 refills | Status: AC

## 2024-09-28 NOTE — Telephone Encounter (Signed)
 Sent!

## 2024-10-26 ENCOUNTER — Other Ambulatory Visit: Payer: Self-pay | Admitting: Psychiatry

## 2024-10-26 DIAGNOSIS — F3110 Bipolar disorder, current episode manic without psychotic features, unspecified: Secondary | ICD-10-CM
